# Patient Record
Sex: Male | Born: 1935
Health system: Southern US, Community
[De-identification: ages and names within clinical notes are randomized; demographics above are authoritative.]

## PROBLEM LIST (undated history)

## (undated) DIAGNOSIS — G4730 Sleep apnea, unspecified: Secondary | ICD-10-CM

## (undated) DIAGNOSIS — J9383 Other pneumothorax: Secondary | ICD-10-CM

## (undated) DIAGNOSIS — M545 Low back pain, unspecified: Secondary | ICD-10-CM

## (undated) DIAGNOSIS — K219 Gastro-esophageal reflux disease without esophagitis: Secondary | ICD-10-CM

## (undated) DIAGNOSIS — I714 Abdominal aortic aneurysm, without rupture, unspecified: Secondary | ICD-10-CM

## (undated) DIAGNOSIS — I779 Disorder of arteries and arterioles, unspecified: Secondary | ICD-10-CM

## (undated) DIAGNOSIS — N400 Enlarged prostate without lower urinary tract symptoms: Secondary | ICD-10-CM

## (undated) DIAGNOSIS — G8929 Other chronic pain: Secondary | ICD-10-CM

## (undated) DIAGNOSIS — J189 Pneumonia, unspecified organism: Secondary | ICD-10-CM

## (undated) DIAGNOSIS — I2510 Atherosclerotic heart disease of native coronary artery without angina pectoris: Secondary | ICD-10-CM

## (undated) DIAGNOSIS — E7800 Pure hypercholesterolemia, unspecified: Secondary | ICD-10-CM

## (undated) DIAGNOSIS — Z7901 Long term (current) use of anticoagulants: Secondary | ICD-10-CM

## (undated) DIAGNOSIS — I499 Cardiac arrhythmia, unspecified: Secondary | ICD-10-CM

## (undated) DIAGNOSIS — K2270 Barrett's esophagus without dysplasia: Secondary | ICD-10-CM

## (undated) DIAGNOSIS — M1990 Unspecified osteoarthritis, unspecified site: Secondary | ICD-10-CM

## (undated) DIAGNOSIS — I10 Essential (primary) hypertension: Secondary | ICD-10-CM

## (undated) DIAGNOSIS — E785 Hyperlipidemia, unspecified: Secondary | ICD-10-CM

## (undated) DIAGNOSIS — G4733 Obstructive sleep apnea (adult) (pediatric): Secondary | ICD-10-CM

## (undated) DIAGNOSIS — I2699 Other pulmonary embolism without acute cor pulmonale: Secondary | ICD-10-CM

## (undated) DIAGNOSIS — M7661 Achilles tendinitis, right leg: Secondary | ICD-10-CM

## (undated) DIAGNOSIS — Z95810 Presence of automatic (implantable) cardiac defibrillator: Secondary | ICD-10-CM

## (undated) DIAGNOSIS — G250 Essential tremor: Secondary | ICD-10-CM

## (undated) DIAGNOSIS — Z951 Presence of aortocoronary bypass graft: Secondary | ICD-10-CM

## (undated) DIAGNOSIS — I219 Acute myocardial infarction, unspecified: Secondary | ICD-10-CM

## (undated) DIAGNOSIS — I25810 Atherosclerosis of coronary artery bypass graft(s) without angina pectoris: Secondary | ICD-10-CM

## (undated) DIAGNOSIS — I509 Heart failure, unspecified: Secondary | ICD-10-CM

## (undated) DIAGNOSIS — L603 Nail dystrophy: Secondary | ICD-10-CM

## (undated) DIAGNOSIS — R0600 Dyspnea, unspecified: Secondary | ICD-10-CM

## (undated) DIAGNOSIS — E039 Hypothyroidism, unspecified: Secondary | ICD-10-CM

## (undated) DIAGNOSIS — I350 Nonrheumatic aortic (valve) stenosis: Secondary | ICD-10-CM

## (undated) DIAGNOSIS — Z9989 Dependence on other enabling machines and devices: Secondary | ICD-10-CM

## (undated) DIAGNOSIS — I4891 Unspecified atrial fibrillation: Secondary | ICD-10-CM

## (undated) HISTORY — DX: Unspecified osteoarthritis, unspecified site: M19.90

## (undated) HISTORY — DX: Barrett's esophagus without dysplasia: K22.70

## (undated) HISTORY — DX: Long term (current) use of anticoagulants: Z79.01

## (undated) HISTORY — DX: Essential tremor: G25.0

## (undated) HISTORY — DX: Nail dystrophy: L60.3

## (undated) HISTORY — DX: Other pulmonary embolism without acute cor pulmonale: I26.99

## (undated) HISTORY — DX: Sleep apnea, unspecified: G47.30

## (undated) HISTORY — DX: Achilles tendinitis, right leg: M76.61

## (undated) HISTORY — PX: PENILE PROSTHESIS IMPLANT: SHX240

## (undated) HISTORY — DX: Atherosclerosis of coronary artery bypass graft(s) without angina pectoris: I25.810

## (undated) HISTORY — DX: Hyperlipidemia, unspecified: E78.5

## (undated) HISTORY — DX: Benign prostatic hyperplasia without lower urinary tract symptoms: N40.0

## (undated) HISTORY — PX: ACHILLES TENDON REPAIR: SUR1153

---

## 1978-09-22 HISTORY — PX: INGUINAL HERNIA REPAIR: SUR1180

## 2000-01-14 ENCOUNTER — Encounter (INDEPENDENT_AMBULATORY_CARE_PROVIDER_SITE_OTHER): Payer: Self-pay | Admitting: Gastroenterology

## 2000-01-14 ENCOUNTER — Encounter (INDEPENDENT_AMBULATORY_CARE_PROVIDER_SITE_OTHER): Payer: Self-pay

## 2000-01-14 ENCOUNTER — Other Ambulatory Visit: Admission: RE | Admit: 2000-01-14 | Discharge: 2000-01-14 | Payer: Self-pay | Admitting: Gastroenterology

## 2001-05-03 ENCOUNTER — Encounter: Payer: Self-pay | Admitting: Specialist

## 2001-05-03 ENCOUNTER — Ambulatory Visit (HOSPITAL_COMMUNITY): Admission: RE | Admit: 2001-05-03 | Discharge: 2001-05-03 | Payer: Self-pay | Admitting: Specialist

## 2001-06-07 ENCOUNTER — Encounter: Payer: Self-pay | Admitting: Emergency Medicine

## 2001-06-07 ENCOUNTER — Inpatient Hospital Stay (HOSPITAL_COMMUNITY): Admission: EM | Admit: 2001-06-07 | Discharge: 2001-06-08 | Payer: Self-pay | Admitting: Emergency Medicine

## 2001-06-08 ENCOUNTER — Encounter: Payer: Self-pay | Admitting: Cardiovascular Disease

## 2001-09-22 HISTORY — PX: SHOULDER OPEN ROTATOR CUFF REPAIR: SHX2407

## 2002-02-28 ENCOUNTER — Ambulatory Visit (HOSPITAL_COMMUNITY): Admission: RE | Admit: 2002-02-28 | Discharge: 2002-02-28 | Payer: Self-pay | Admitting: Specialist

## 2002-03-08 ENCOUNTER — Encounter: Payer: Self-pay | Admitting: Specialist

## 2002-03-08 ENCOUNTER — Ambulatory Visit (HOSPITAL_COMMUNITY): Admission: RE | Admit: 2002-03-08 | Discharge: 2002-03-08 | Payer: Self-pay | Admitting: Specialist

## 2002-04-05 ENCOUNTER — Inpatient Hospital Stay (HOSPITAL_COMMUNITY): Admission: RE | Admit: 2002-04-05 | Discharge: 2002-04-06 | Payer: Self-pay | Admitting: Specialist

## 2003-06-06 ENCOUNTER — Ambulatory Visit (HOSPITAL_COMMUNITY): Admission: RE | Admit: 2003-06-06 | Discharge: 2003-06-06 | Payer: Self-pay | Admitting: Gastroenterology

## 2003-06-06 ENCOUNTER — Encounter (INDEPENDENT_AMBULATORY_CARE_PROVIDER_SITE_OTHER): Payer: Self-pay | Admitting: Specialist

## 2004-09-22 HISTORY — PX: KNEE ARTHROSCOPY: SHX127

## 2004-10-07 ENCOUNTER — Encounter: Admission: RE | Admit: 2004-10-07 | Discharge: 2004-10-07 | Payer: Self-pay | Admitting: Orthopedic Surgery

## 2004-10-09 ENCOUNTER — Ambulatory Visit (HOSPITAL_BASED_OUTPATIENT_CLINIC_OR_DEPARTMENT_OTHER): Admission: RE | Admit: 2004-10-09 | Discharge: 2004-10-09 | Payer: Self-pay | Admitting: Orthopedic Surgery

## 2004-10-09 ENCOUNTER — Ambulatory Visit (HOSPITAL_COMMUNITY): Admission: RE | Admit: 2004-10-09 | Discharge: 2004-10-09 | Payer: Self-pay | Admitting: Orthopedic Surgery

## 2004-12-12 ENCOUNTER — Encounter: Admission: RE | Admit: 2004-12-12 | Discharge: 2004-12-12 | Payer: Self-pay | Admitting: Orthopedic Surgery

## 2005-01-09 ENCOUNTER — Encounter: Admission: RE | Admit: 2005-01-09 | Discharge: 2005-01-09 | Payer: Self-pay | Admitting: Orthopedic Surgery

## 2005-03-18 ENCOUNTER — Encounter: Admission: RE | Admit: 2005-03-18 | Discharge: 2005-04-02 | Payer: Self-pay | Admitting: Orthopedic Surgery

## 2007-01-14 ENCOUNTER — Ambulatory Visit: Payer: Self-pay | Admitting: Vascular Surgery

## 2007-12-27 ENCOUNTER — Ambulatory Visit: Payer: Self-pay

## 2008-07-31 ENCOUNTER — Ambulatory Visit: Payer: Self-pay | Admitting: Vascular Surgery

## 2008-08-18 DIAGNOSIS — K449 Diaphragmatic hernia without obstruction or gangrene: Secondary | ICD-10-CM | POA: Insufficient documentation

## 2008-08-18 DIAGNOSIS — K2270 Barrett's esophagus without dysplasia: Secondary | ICD-10-CM

## 2008-08-18 DIAGNOSIS — Z860100 Personal history of colon polyps, unspecified: Secondary | ICD-10-CM | POA: Insufficient documentation

## 2008-08-18 DIAGNOSIS — K5730 Diverticulosis of large intestine without perforation or abscess without bleeding: Secondary | ICD-10-CM | POA: Insufficient documentation

## 2008-08-22 ENCOUNTER — Ambulatory Visit: Payer: Self-pay | Admitting: Gastroenterology

## 2008-08-22 DIAGNOSIS — I10 Essential (primary) hypertension: Secondary | ICD-10-CM | POA: Insufficient documentation

## 2008-08-22 DIAGNOSIS — G4733 Obstructive sleep apnea (adult) (pediatric): Secondary | ICD-10-CM

## 2008-08-23 ENCOUNTER — Encounter: Payer: Self-pay | Admitting: Gastroenterology

## 2008-08-23 ENCOUNTER — Ambulatory Visit: Payer: Self-pay | Admitting: Gastroenterology

## 2008-08-25 ENCOUNTER — Encounter: Payer: Self-pay | Admitting: Gastroenterology

## 2009-11-29 ENCOUNTER — Inpatient Hospital Stay (HOSPITAL_COMMUNITY): Admission: EM | Admit: 2009-11-29 | Discharge: 2009-12-03 | Payer: Self-pay | Admitting: Emergency Medicine

## 2010-02-07 ENCOUNTER — Ambulatory Visit: Payer: Self-pay

## 2010-02-07 ENCOUNTER — Encounter (INDEPENDENT_AMBULATORY_CARE_PROVIDER_SITE_OTHER): Payer: Self-pay | Admitting: Internal Medicine

## 2010-02-07 ENCOUNTER — Ambulatory Visit (HOSPITAL_COMMUNITY): Admission: RE | Admit: 2010-02-07 | Discharge: 2010-02-07 | Payer: Self-pay | Admitting: Internal Medicine

## 2010-02-07 ENCOUNTER — Ambulatory Visit: Payer: Self-pay | Admitting: Internal Medicine

## 2010-02-08 ENCOUNTER — Ambulatory Visit (HOSPITAL_COMMUNITY): Admission: RE | Admit: 2010-02-08 | Discharge: 2010-02-08 | Payer: Self-pay | Admitting: Internal Medicine

## 2010-02-08 ENCOUNTER — Encounter: Payer: Self-pay | Admitting: Pulmonary Disease

## 2010-02-26 ENCOUNTER — Ambulatory Visit: Payer: Self-pay | Admitting: Pulmonary Disease

## 2010-02-26 DIAGNOSIS — R0602 Shortness of breath: Secondary | ICD-10-CM

## 2010-02-26 LAB — CONVERTED CEMR LAB
CO2: 30 meq/L (ref 19–32)
Chloride: 106 meq/L (ref 96–112)
Potassium: 4.6 meq/L (ref 3.5–5.1)

## 2010-02-27 ENCOUNTER — Ambulatory Visit: Payer: Self-pay | Admitting: Cardiology

## 2010-02-27 ENCOUNTER — Encounter: Payer: Self-pay | Admitting: Pulmonary Disease

## 2010-03-13 ENCOUNTER — Ambulatory Visit: Payer: Self-pay | Admitting: Pulmonary Disease

## 2010-04-10 ENCOUNTER — Ambulatory Visit: Payer: Self-pay | Admitting: Pulmonary Disease

## 2010-04-10 DIAGNOSIS — J4520 Mild intermittent asthma, uncomplicated: Secondary | ICD-10-CM

## 2010-05-06 ENCOUNTER — Ambulatory Visit: Payer: Self-pay

## 2010-05-06 ENCOUNTER — Encounter (INDEPENDENT_AMBULATORY_CARE_PROVIDER_SITE_OTHER): Payer: Self-pay | Admitting: Internal Medicine

## 2010-05-06 ENCOUNTER — Ambulatory Visit: Payer: Self-pay | Admitting: Cardiology

## 2010-05-06 ENCOUNTER — Ambulatory Visit (HOSPITAL_COMMUNITY): Admission: RE | Admit: 2010-05-06 | Discharge: 2010-05-06 | Payer: Self-pay | Admitting: Internal Medicine

## 2010-05-29 ENCOUNTER — Ambulatory Visit: Payer: Self-pay | Admitting: Pulmonary Disease

## 2010-05-29 DIAGNOSIS — R490 Dysphonia: Secondary | ICD-10-CM | POA: Insufficient documentation

## 2010-06-24 ENCOUNTER — Telehealth (INDEPENDENT_AMBULATORY_CARE_PROVIDER_SITE_OTHER): Payer: Self-pay | Admitting: *Deleted

## 2010-10-08 ENCOUNTER — Ambulatory Visit
Admission: RE | Admit: 2010-10-08 | Discharge: 2010-10-08 | Payer: Self-pay | Source: Home / Self Care | Attending: Pulmonary Disease | Admitting: Pulmonary Disease

## 2010-10-18 ENCOUNTER — Ambulatory Visit: Admit: 2010-10-18 | Payer: Self-pay | Admitting: Cardiology

## 2010-10-22 NOTE — Assessment & Plan Note (Signed)
Summary: rov 2 wks ///kp   Visit Type:  Follow-up Copy to:  Creola Corn Primary Provider/Referring Provider:  Creola Corn, MD  CC:  Dyspnea follow-up.  Discuss CT chest results.  The patient says he has noticed a slight improvement in his breathing on Advair.Eric Gordon  History of Present Illness: 77 yo male with dyspnea, recent pneumonia, asthma, diastolic dysfunction, deconditioning.  His breathing has improved some.  He still gets winded with activity.  He is not having much cough, wheeze, or chest congestion.  He has not had trouble with his sinuses or sore throat.  He denies gland swelling, leg swelling, chest pain, or palpitations.  He has not had fever.  He uses his combivent about once or twice per week, and this helps.  CT of Chest  Procedure date:  02/27/2010  Findings:      CT ANGIOGRAPHY CHEST WITH CONTRAST   Technique:  Multidetector CT imaging of the chest was performed using the standard protocol during bolus administration of intravenous contrast.  Multiplanar CT image reconstructions including MIPs were obtained to evaluate the vascular anatomy.   Contrast:  72 ml Omnipaque 350 IV.   Comparison:  CT 12/01/2009.   Findings:  There is good pulmonary artery enhancement.  Negative for pulmonary embolism.   There is atherosclerotic disease in the aortic arch without aneurysm or dissection.  There is moderate coronary artery atherosclerosis.  The heart size is normal and there is no pericardial effusion.   There is mild bibasilar atelectasis which has nearly completely resolved compared the prior study.  Negative for acute infiltrate or effusion.  There is no mass or adenopathy. No significant emphysema or fibrosis in the lungs.   Review of the MIP images confirms the above findings.   IMPRESSION: Negative for pulmonary embolism.   Resolving bibasilar atelectasis.   Current Medications (verified): 1)  Advair Diskus 250-50 Mcg/dose Aepb (Fluticasone-Salmeterol) ....  One Puff Two Times A Day 2)  Combivent 18-103 Mcg/act Aero (Ipratropium-Albuterol) .... Use As Needed 3)  Fish Oil   Oil (Fish Oil) .Eric Gordon.. 1 Tablet By Mouth Once Daily 4)  Celebrex 200 Mg Caps (Celecoxib) .Eric Gordon.. 1 Tablet By Mouth Once Daily 5)  Cozaar 100 Mg Tabs (Losartan Potassium) .Eric Gordon.. 1 Tablet By Mouth Once Daily 6)  Adult Aspirin Ec Low Strength 81 Mg Tbec (Aspirin) .Eric Gordon.. 1 Tablet By Mouth Once Daily 7)  Simvastatin 40 Mg Tabs (Simvastatin) .Eric Gordon.. 1 Tablet By Mouth Once Daily 8)  Flomax 0.4 Mg Xr24h-Cap (Tamsulosin Hcl) .Eric Gordon.. 1 Tablet By Mouth Once Daily 9)  Omeprazole 20 Mg Cpdr (Omeprazole) .Eric Gordon.. 1 Tablet By Mouth Once Daily 10)  Vitamin C 500 Mg  Tabs (Ascorbic Acid) .Eric Gordon.. 1 Tablet By Mouth Once Daily 11)  Vitamin D 1000 Unit  Tabs (Cholecalciferol) .Eric Gordon.. 1 Tablet By Mouth Once Daily  Allergies (verified): No Known Drug Allergies   Past History:  Past Medical History: Reviewed history from 02/26/2010 and no changes required. Dyspnea      - PFT 02/08/10 FEV1 2.93(76%), FEV1% 73, TLC 6.51(78%), DLCO 67%, Positive BD response Pneumonia March 2011 Hypertension.  Grade 1 diastolic dysfunction      - Echo May 2011 Hyperlipidemia.  Hypertriglyceridemia.  GERD with Barrett esophagus.  Colon polyps.  Mild diverticulosis.  Degenerative low back osteoarthritis.  Severe OSA, on CPAP.  BPH with urinary obstruction symptoms.  Elevated PSA with negative biopsy.  ED with a penile pump.  Overactive bladder.  Nephrolithiasis.  Left carotid bruit, secondary ECA stenosis.  OA.  Atypical chest pain.  Plantar fasciitis.  Fungal dermatitis.  Bilateral ICA stenosis of 1-39% in 2005 and 2009 without any change.  Benign essential tremor.   Past Surgical History: Reviewed history from 02/26/2010 and no changes required.  1. Penile implant.   2. Right rotator cuff surgery.   3. Right Achilles heal operation in January 2006.   4. Ankle surgery.   5. Inguinal hernia in 1997.   6. history of knee  surgery.   Vital Signs:  Patient profile:   77 year old male Height:      75.5 inches (191.77 cm) Weight:      245 pounds (111.36 kg) BMI:     30.33 O2 Sat:      95 % on Room air Temp:     98.1 degrees F (36.72 degrees C) oral Pulse rate:   78 / minute BP sitting:   120 / 78  (right arm) Cuff size:   regular  Vitals Entered By: Michel Bickers CMA (March 13, 2010 12:01 PM)  O2 Sat at Rest %:  95 O2 Flow:  Room air CC: Dyspnea follow-up.  Discuss CT chest results.  The patient says he has noticed a slight improvement in his breathing on Advair. Comments Medications reviewed. Daytime phone verified. Michel Bickers [facility]  March 13, 2010 12:02 PM   Physical Exam  General:  normal appearance, healthy appearing, and obese.   Nose:  no deformity, discharge, inflammation, or lesions Mouth:  no deformity or lesions Neck:  no JVD.   Lungs:  diminished breath sounds, no wheezing or rales. Heart:  regular rhythm, normal rate, and no murmurs.   Extremities:  no clubbing, cyanosis, edema, or deformity noted Cervical Nodes:  no significant adenopathy   Impression & Recommendations:  Problem # 1:  DYSPNEA (ICD-786.05) This is likely on the basis of asthma after recent episode of pneumonia.  He has noticed some improvement with increased dose of advair.  Will give him a short course of prednisone.  Have advised him to gradually increase his exercise level as tolerated.  He has diastolic dysfunction, and this is being addressed by primary care.    If he does not notice much improvement from prednisone, then he may need to have cardiopulmonary stress testing (CPST).  Problem # 2:     Medications Added to Medication List This Visit: 1)  Prednisone 10 Mg Tabs (Prednisone) .... 3 pills for 2 days, 2 pills for 2 days, 1 pill for 2 days, 1/2 pill for 2 days  Complete Medication List: 1)  Advair Diskus 250-50 Mcg/dose Aepb (Fluticasone-salmeterol) .... One puff two times a day 2)  Combivent 18-103  Mcg/act Aero (Ipratropium-albuterol) .... Use as needed 3)  Fish Oil Oil (Fish oil) .Eric Gordon.. 1 tablet by mouth once daily 4)  Celebrex 200 Mg Caps (Celecoxib) .Eric Gordon.. 1 tablet by mouth once daily 5)  Cozaar 100 Mg Tabs (Losartan potassium) .Eric Gordon.. 1 tablet by mouth once daily 6)  Adult Aspirin Ec Low Strength 81 Mg Tbec (Aspirin) .Eric Gordon.. 1 tablet by mouth once daily 7)  Simvastatin 40 Mg Tabs (Simvastatin) .Eric Gordon.. 1 tablet by mouth once daily 8)  Flomax 0.4 Mg Xr24h-cap (Tamsulosin hcl) .Eric Gordon.. 1 tablet by mouth once daily 9)  Omeprazole 20 Mg Cpdr (Omeprazole) .Eric Gordon.. 1 tablet by mouth once daily 10)  Vitamin C 500 Mg Tabs (Ascorbic acid) .Eric Gordon.. 1 tablet by mouth once daily 11)  Vitamin D 1000 Unit Tabs (Cholecalciferol) .Eric Gordon.. 1 tablet by mouth once daily 12)  Prednisone 10 Mg Tabs (Prednisone) .... 3 pills for 2 days, 2 pills for 2 days, 1 pill for 2 days, 1/2 pill for 2 days  Other Orders: Est. Patient Level IV (16109) Prescription Created Electronically [phone])  Patient Instructions: 1)  Prednisone 10 mg pills: 3 pills for 2 days, 2 pills for 2 days, 1 pill for 2 days, 1/2 pill for 2 days 2)  Continue advair 250/50 one puff two times a day, and rinse mouth after using 3)  Continue combivent two puffs up to four times per day as needed  4)  Follow up in 3 to 4 weeks Prescriptions: PREDNISONE 10 MG TABS (PREDNISONE) 3 pills for 2 days, 2 pills for 2 days, 1 pill for 2 days, 1/2 pill for 2 days  #13 x 0   Entered and Authorized by:   Coralyn Helling MD   Signed by:   Coralyn Helling MD on 03/13/2010   Method used:   Electronically to        [facility]  [phone]* (retail)       [address]       [place], [place]  [address]       Ph: [phone] or [phone]       Fax: [phone]   RxID:   [id number]    Immunization History:  Influenza Immunization History:    Influenza:  historical (06/22/2009)  Pneumovax Immunization History:    Pneumovax:  historical  (09/23/2003)

## 2010-10-22 NOTE — Progress Notes (Signed)
Summary: medication questions  Phone Note Call from Patient Call back at Home Phone [phone]   Caller: Spouse//sylvia Call For: sood Summary of Call: Wants to know if it's ok for her husband to have a flu shot while he's taking symbicort and is it ok for him to have a routine checkup with his dentist while he on this medication also, pls advise. Initial call taken by: Darletta Moll,  June 24, 2010 9:56 AM  Follow-up for Phone Call        Spoke with pt's spouse and verified the msg.  I advised that it is fine for him to have flu shot while taking symbicort and also should be fine to go to dentist for routine visit, but if concrned about this could check with his dentist. Follow-up by: Vernie Murders,  June 24, 2010 10:18 AM

## 2010-10-22 NOTE — Miscellaneous (Signed)
Summary: Pulmonary function test   Pulmonary Function Test Date: 02/08/2010 Height (in.): 76 Gender: Male  Pre-Spirometry FVC    Value: 3.73 L/min   Pred: 5.23 L/min     % Pred: 71 % FEV1    Value: 2.61 L     Pred: 3.81 L     % Pred: 68 % FEV1/FVC  Value: 70 %     Pred: 72 %     % Pred: . % FEF 25-75  Value: 1.53 L/min   Pred: 2.74 L/min     % Pred: 55 %  Post-Spirometry FVC    Value: 3.99 L/min   Pred: 5.23 L/min     % Pred: 76 % FEV1    Value: 2.93 L     Pred: 3.81 L     % Pred: 76 % FEV1/FVC  Value: 73 %     Pred: 72 %     % Pred: . % FEF 25-75  Value: 2.74 L/min   Pred: 2.74 L/min     % Pred: 100 %  Lung Volumes TLC    Value: 6.51 L   % Pred: 78 % RV    Value: 2.35 L   % Pred: 80 % DLCO    Value: 27.16 %   % Pred: 67 % DLCO/VA  Value: 6.40 %   % Pred: 77 %  Comments: Mild obstruction.  Positive bronchodilator response.  Mild restriction.  Mild diffusion defect corrects for lung volumes. Clinical Lists Changes  Observations: Added new observation of PFT COMMENTS: Mild obstruction.  Positive bronchodilator response.  Mild restriction.  Mild diffusion defect corrects for lung volumes. (02/08/2010 13:23) Added new observation of DLCO/VA%EXP: 77 % (02/08/2010 13:23) Added new observation of DLCO/VA: 6.40 % (02/08/2010 13:23) Added new observation of DLCO % EXPEC: 67 % (02/08/2010 13:23) Added new observation of DLCO: 27.16 % (02/08/2010 13:23) Added new observation of RV % EXPECT: 80 % (02/08/2010 13:23) Added new observation of RV: 2.35 L (02/08/2010 13:23) Added new observation of TLC % EXPECT: 78 % (02/08/2010 13:23) Added new observation of TLC: 6.51 L (02/08/2010 13:23) Added new observation of FEF2575%EXPS: 100 % (02/08/2010 13:23) Added new observation of PSTFEF25/75P: 2.74  (02/08/2010 13:23) Added new observation of PSTFEF25/75%: 2.74 L/min (02/08/2010 13:23) Added new observation of PSTFEV1/FCV%: . % (02/08/2010 13:23) Added new observation of FEV1FVCPRDPS: 72 %  (02/08/2010 13:23) Added new observation of PSTFEV1/FVC: 73 % (02/08/2010 13:23) Added new observation of POSTFEV1%PRD: 76 % (02/08/2010 13:23) Added new observation of FEV1PRDPST: 3.81 L (02/08/2010 13:23) Added new observation of POST FEV1: 2.93 L/min (02/08/2010 13:23) Added new observation of POST FVC%EXP: 76 % (02/08/2010 13:23) Added new observation of FVCPRDPST: 5.23 L/min (02/08/2010 13:23) Added new observation of POST FVC: 3.99 L (02/08/2010 13:23) Added new observation of FEF % EXPEC: 55 % (02/08/2010 13:23) Added new observation of FEF25-75%PRE: 2.74 L/min (02/08/2010 13:23) Added new observation of FEF 25-75%: 1.53 L/min (02/08/2010 13:23) Added new observation of FEV1/FVC%EXP: . % (02/08/2010 13:23) Added new observation of FEV1/FVC PRE: 72 % (02/08/2010 13:23) Added new observation of FEV1/FVC: 70 % (02/08/2010 13:23) Added new observation of FEV1 % EXP: 68 % (02/08/2010 13:23) Added new observation of FEV1 PREDICT: 3.81 L (02/08/2010 13:23) Added new observation of FEV1: 2.61 L (02/08/2010 13:23) Added new observation of FVC % EXPECT: 71 % (02/08/2010 13:23) Added new observation of FVC PREDICT: 5.23 L (02/08/2010 13:23) Added new observation of FVC: 3.73 L (02/08/2010 13:23) Added new observation of PFT HEIGHT: 76  (  02/08/2010 13:23) Added new observation of PFT DATE: 02/08/2010  (02/08/2010 13:23)

## 2010-10-22 NOTE — Assessment & Plan Note (Signed)
Summary: dyspnea on excertion/apc   Copy to:  Creola Corn Primary Provider/Referring Provider:  Creola Corn, MD  CC:  Pulmonary Consult.  History of Present Illness: 77 yo male with dyspnea.  He was hospitalized in March for community acquired pneumonia.  He has been having trouble with his breathing since.  He did not have any problems prior to this.  When he first developed pneumonia he was initially treated as an outpatient with avelox, prednisone, and advair.  He continued to get worse, and then required admission to the hospital.  He was treated with rocephin, zithromax, and vancomycin.    He has been getting "breathing attacks" since he left the hospital.  This has happened three or four times.  He would get winded, sweaty, and dizzy.  He felt like he couldn't get air into his lungs.  He has noticed getting winded when he talks, and his voice gets week.  He can only walk about 100 feet before getting winded.  He is not having much cough or congestion now.  He gets occasional wheeze.  He has not had fever, hemoptysis, sweats, palpitations, or chest pain.  He has not had gland swelling, leg swelling, or skin rash.  His muscle strength has been okay.  There is no prior history of asthma, allergies, pneumonia, or TB.  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  He has not had any animal exposures.  There is no recent travel history.  He is from [place], and has lived here all his life.  He was in the [employer] from 1953 to 1957, and stationed at [employer].  He has been using combivent since her got out of the hospital, and this has helped.  He was started on advair about one week ago, and has no had a breathing attack since.  He had a chest xray with Dr. Timothy Lasso in May, and was told that it was okay.  Pulmonary function testing from Feb 08, 2010: Mild obstruction.  Positive bronchodilator response.  Mild restriction.  Mild diffusion defect corrects for lung  volumes.   CXR  Procedure date:  11/29/2009  Findings:       CHEST - 2 VIEW    Comparison: 10/07/2004    Findings: There is lingular / left lower lobe airspace disease   noted concerning for pneumonia.  Heart is normal size.  No focal   opacity on the right.  No effusions or acute bony abnormality.    IMPRESSION:   Left basilar opacity concerning for pneumonia.  CXR  Procedure date:  12/01/2009  Findings:       CHEST - 2 VIEW    Comparison: 11/29/2009    Findings: Heart size appears normal.    There is no pleural effusion or pulmonary edema.    No airspace consolidation is identified.    Scar versus atelectasis is noted in the left base.    No airspace consolidation identified.    IMPRESSION:    1.  Left base scar versus atelectasis.  CT of Chest  Procedure date:  12/01/2009  Findings:       CT ANGIOGRAPHY CHEST WITH CONTRAST    Technique:  Multidetector CT imaging of the chest was performed   using the standard protocol during bolus administration of   intravenous contrast.  Multiplanar CT image reconstructions   including MIPs were obtained to evaluate the vascular anatomy.    Contrast:  100 ml Omnipaque-300    Comparison:  Chest x-ray 12/01/2009  Findings:  Pulmonary arteries are well opacified.  There is no   evidence for acute pulmonary embolus.  Heart size is normal.  There   are patchy, linear densities involving the posterior aspects of the   lower lobes bilaterally, consistent atelectasis and/or infiltrates.   No pleural effusions are identified.  No pulmonary nodules.  No   mediastinal, hilar, or axillary adenopathy.  The thyroid gland has   a normal CT appearance. There is atherosclerotic calcification of   the thoracic aorta.    Images of the upper abdomen are unremarkable.  Degenerative changes   are seen in the thoracic spine.    Review of the MIP images confirms the above findings.    IMPRESSION:   Technically adequate exam  showing no evidence for acute pulmonary   embolus.  Bilateral lower lobe atelectasis and/or infiltrates.  Echocardiogram  Procedure date:  02/07/2010  Findings:       Study Conclusions            - Left ventricle: The cavity size was normal. There was mild focal       basal and mild concentric hypertrophy of the septum. Systolic       function was normal. The estimated ejection fraction was in the       range of 60% to 65%. Wall motion was normal; there were no       regional wall motion abnormalities. Doppler parameters are       consistent with abnormal left ventricular relaxation (grade 1       diastolic dysfunction). Doppler parameters are consistent with       high ventricular filling pressure.     - Mitral valve: Calcified annulus.     - Left atrium: The atrium was mildly to moderately dilated.     - Right atrium: The atrium was mildly to moderately dilated.   Current Medications (verified): 1)  Fish Oil   Oil (Fish Oil) .Marland Kitchen.. 1 Tablet By Mouth Once Daily 2)  Celebrex 200 Mg Caps (Celecoxib) .Marland Kitchen.. 1 Tablet By Mouth Once Daily 3)  Cozaar 100 Mg Tabs (Losartan Potassium) .Marland Kitchen.. 1 Tablet By Mouth Once Daily 4)  Adult Aspirin Ec Low Strength 81 Mg Tbec (Aspirin) .Marland Kitchen.. 1 Tablet By Mouth Once Daily 5)  Simvastatin 40 Mg Tabs (Simvastatin) .Marland Kitchen.. 1 Tablet By Mouth Once Daily 6)  Flomax 0.4 Mg Xr24h-Cap (Tamsulosin Hcl) .Marland Kitchen.. 1 Tablet By Mouth Once Daily 7)  Clonazepam 0.5 Mg Tabs (Clonazepam) .Marland Kitchen.. 1 Tablet By Mouth Once Daily As Needed 8)  Omeprazole 20 Mg Cpdr (Omeprazole) .Marland Kitchen.. 1 Tablet By Mouth Once Daily 9)  Vitamin C 500 Mg  Tabs (Ascorbic Acid) .Marland Kitchen.. 1 Tablet By Mouth Once Daily 10)  Vitamin D 1000 Unit  Tabs (Cholecalciferol) .Marland Kitchen.. 1 Tablet By Mouth Once Daily 11)  Combivent 18-103 Mcg/act Aero (Ipratropium-Albuterol) .... Use As Needed 12)  Advair Diskus 100-50 Mcg/dose Aepb (Fluticasone-Salmeterol) .... Inhale 1 Puff Two Times A Day  Allergies (verified): No Known Drug  Allergies  Past History:  Past Medical History: Dyspnea      - PFT 02/08/10 FEV1 2.93(76%), FEV1% 73, TLC 6.51(78%), DLCO 67%, Positive BD response Pneumonia March 2011 Hypertension.  Grade 1 diastolic dysfunction      - Echo May 2011 Hyperlipidemia.  Hypertriglyceridemia.  GERD with Barrett esophagus.  Colon polyps.  Mild diverticulosis.  Degenerative low back osteoarthritis.  Severe OSA, on CPAP.  BPH with urinary obstruction symptoms.  Elevated PSA with negative biopsy.  ED  with a penile pump.  Overactive bladder.  Nephrolithiasis.  Left carotid bruit, secondary ECA stenosis.  OA.  Atypical chest pain.  Plantar fasciitis.  Fungal dermatitis.  Bilateral ICA stenosis of 1-39% in 2005 and 2009 without any change.  Benign essential tremor.   Past Surgical History:  1. Penile implant.   2. Right rotator cuff surgery.   3. Right Achilles heal operation in January 2006.   4. Ankle surgery.   5. Inguinal hernia in 1997.   6. history of knee surgery.   Family History: Father died at the age of 64 of AAA.  Mother died at the age 43 with coronary disease and hypertension.  Siblings will include medical diagnosis of diabetes, coronary artery disease, CABG, peripheral vascular disease, and hypertension.   Social History: Married to [place], has two sons.  Works in Airline pilot.   Quit smoking in 1985.  Started age 38, and smoked 1.5 packs per day.  Review of Systems       The patient complains of shortness of breath with activity, shortness of breath at rest, acid heartburn, hand/feet swelling, and joint stiffness or pain.  The patient denies productive cough, non-productive cough, coughing up blood, chest pain, irregular heartbeats, indigestion, loss of appetite, weight change, abdominal pain, difficulty swallowing, sore throat, tooth/dental problems, headaches, nasal congestion/difficulty breathing through nose, sneezing, itching, ear ache, anxiety, depression, rash, change in color  of mucus, and fever.    Vital Signs:  Patient profile:   77 year old male Height:      75.5 inches Weight:      245 pounds BMI:     30.33 O2 Sat:      94 % on Room air Temp:     97.6 degrees F oral Pulse rate:   73 / minute BP sitting:   158 / 88  (left arm) Cuff size:   regular  Vitals Entered By: Arman Filter LPN (February 26, 1609 3:16 PM)  O2 Flow:  Room air CC: Pulmonary Consult Comments Medications reviewed with patient  Arman Filter LPN  February 27, 9603 3:18 PM    Physical Exam  General:  normal appearance, healthy appearing, and obese.   Eyes:  PERRLA and EOMI.   Ears:  TMs intact and clear with normal canals Nose:  no deformity, discharge, inflammation, or lesions Mouth:  no deformity or lesions Neck:  no JVD.   Chest Wall:  no deformities noted Lungs:  diminished breath sounds, no wheezing or rales. Heart:  regular rhythm, normal rate, and no murmurs.   Abdomen:  bowel sounds positive; abdomen soft and non-tender without masses, or organomegaly Msk:  no deformity or scoliosis noted with normal posture Pulses:  pulses normal Extremities:  no clubbing, cyanosis, edema, or deformity noted Neurologic:  CN II-XII grossly intact with normal reflexes, coordination, muscle strength and tone Skin:  intact without lesions or rashes Cervical Nodes:  no significant adenopathy Axillary Nodes:  no significant adenopathy Psych:  alert and cooperative; normal mood and affect; normal attention span and concentration   Impression & Recommendations:  Problem # 1:  DYSPNEA (ICD-786.05) He has onset of symptoms of dyspnea after being treated for pneumonia in March of this year.  He has since developed symptoms suggestive of asthma, and has partial improvement in his symptoms from inhaler therapy.  He likely has developed reactive airway disease after his pneumonia.  Will need to monitor whether his symptoms eventually resolve the further he gets from his pneumonia, or  if he will  need chronic asthma therapy.  He has an extensive prior history of smoking.  I will increase his dose of advair, and continue as needed combivent.  I don't think he needs additional prednisone or antibiotics at this time.  He had mild restrictive and diffusion defect on recent PFT.  My clinical suspicion for either interstitial lung disease or thrombo-embolic disease is low.  However, to further exclude these I will arrange for a repeat CT chest with IV contrast and PE protocol.  He has a history of hypertension, and recent echo showed grade 1 diastolic dysfunction.  This may be contributing to his dyspnea, but does not likely have a prominent role.  Explained the importance of adequate control of his blood pressure, and maintaining compliance with his therapy for sleep apnea. These are being managed by primary care.  He likely also has a component of deconditioning related to relative inactivity since his episode of pneumonia.  I have advised him to defer starting an exercise regimen until his pulmonary evaluation is completed.  However, there is nothing on his exam to suggest respiratory muscle weakness as a cause of his dyspnea.  Medications Added to Medication List This Visit: 1)  Advair Diskus 100-50 Mcg/dose Aepb (Fluticasone-salmeterol) .... Inhale 1 puff two times a day 2)  Advair Diskus 250-50 Mcg/dose Aepb (Fluticasone-salmeterol) .... One puff two times a day 3)  Combivent 18-103 Mcg/act Aero (Ipratropium-albuterol) .... Use as needed 4)  Clonazepam 0.5 Mg Tabs (Clonazepam) .Marland Kitchen.. 1 tablet by mouth once daily as needed  Complete Medication List: 1)  Advair Diskus 250-50 Mcg/dose Aepb (Fluticasone-salmeterol) .... One puff two times a day 2)  Combivent 18-103 Mcg/act Aero (Ipratropium-albuterol) .... Use as needed 3)  Fish Oil Oil (Fish oil) .Marland Kitchen.. 1 tablet by mouth once daily 4)  Celebrex 200 Mg Caps (Celecoxib) .Marland Kitchen.. 1 tablet by mouth once daily 5)  Cozaar 100 Mg Tabs (Losartan potassium)  .Marland Kitchen.. 1 tablet by mouth once daily 6)  Adult Aspirin Ec Low Strength 81 Mg Tbec (Aspirin) .Marland Kitchen.. 1 tablet by mouth once daily 7)  Simvastatin 40 Mg Tabs (Simvastatin) .Marland Kitchen.. 1 tablet by mouth once daily 8)  Flomax 0.4 Mg Xr24h-cap (Tamsulosin hcl) .Marland Kitchen.. 1 tablet by mouth once daily 9)  Clonazepam 0.5 Mg Tabs (Clonazepam) .Marland Kitchen.. 1 tablet by mouth once daily as needed 10)  Omeprazole 20 Mg Cpdr (Omeprazole) .Marland Kitchen.. 1 tablet by mouth once daily 11)  Vitamin C 500 Mg Tabs (Ascorbic acid) .Marland Kitchen.. 1 tablet by mouth once daily 12)  Vitamin D 1000 Unit Tabs (Cholecalciferol) .Marland Kitchen.. 1 tablet by mouth once daily  Other Orders: Consultation Level V (37106) Prescription Created Electronically [phone]) Radiology Referral (Radiology) TLB-BMP (Basic Metabolic Panel-BMET) (80048-METABOL)  Patient Instructions: 1)  Continue advair one puff two times a day 2)  Combivent two puffs up to four times per day as needed for cough, wheeze, chest congestion, or shortness of breath 3)  Will schedule CT chest 4)  Follow up in 2 to 3 weeks Prescriptions: ADVAIR DISKUS 250-50 MCG/DOSE AEPB (FLUTICASONE-SALMETEROL) one puff two times a day  #1 x 3   Entered and Authorized by:   Coralyn Helling MD   Signed by:   Coralyn Helling MD on 02/26/2010   Method used:   Electronically to        [facility]  [phone]* (retail)       [address] [place]  [place], [place]  [address]       Ph: [phone] or [phone]       Fax: [phone]   RxID:   [id number]

## 2010-10-22 NOTE — Assessment & Plan Note (Signed)
Summary: 2 months/apc   Visit Type:  Follow-up Copy to:  Eric Gordon Primary Provider/Referring Provider:  Creola Corn, MD  CC:  2 month dyspnea follow-up...patient c/o sob with exertion...still having difficulty swallowing...stopped singulair because swallowing became more difficult.  History of Present Illness: 77 yo male with dyspnea, recent pneumonia, asthma, diastolic dysfunction, deconditioning.  He still has some trouble with his breathing.  He has noticed some difficulty swallowing pills.  He denies pain with swallowing.  He has been using singulair, but is not sure if this has helped much.  He has been getting hoarse, and his voice feels weak.    He denies cough, wheeze, chest pain, or fever.  His sinuses have been okay.  He uses combivent occasionally, and this helps when he uses it.    Current Medications (verified): 1)  Advair Diskus 250-50 Mcg/dose Aepb (Fluticasone-Salmeterol) .... One Puff Two Times A Day 2)  Combivent 18-103 Mcg/act Aero (Ipratropium-Albuterol) .... Use As Needed 3)  Fish Oil   Oil (Fish Oil) .Marland Kitchen.. 1 Tablet By Mouth Once Daily 4)  Celebrex 200 Mg Caps (Celecoxib) .Marland Kitchen.. 1 Tablet By Mouth Once Daily 5)  Cozaar 100 Mg Tabs (Losartan Potassium) .Marland Kitchen.. 1 Tablet By Mouth Once Daily 6)  Adult Aspirin Ec Low Strength 81 Mg Tbec (Aspirin) .Marland Kitchen.. 1 Tablet By Mouth Once Daily 7)  Simvastatin 40 Mg Tabs (Simvastatin) .Marland Kitchen.. 1 Tablet By Mouth Once Daily 8)  Flomax 0.4 Mg Xr24h-Cap (Tamsulosin Hcl) .Marland Kitchen.. 1 Tablet By Mouth Once Daily 9)  Omeprazole 20 Mg Cpdr (Omeprazole) .Marland Kitchen.. 1 Tablet By Mouth Once Daily 10)  Vitamin C 500 Mg  Tabs (Ascorbic Acid) .Marland Kitchen.. 1 Tablet By Mouth Once Daily 11)  Vitamin D 1000 Unit  Tabs (Cholecalciferol) .Marland Kitchen.. 1 Tablet By Mouth Once Daily  Allergies (verified): No Known Drug Allergies  Past History:  Past Medical History: Reviewed history from 04/10/2010 and no changes required. Dyspnea 2nd to asthma and diastolic dysfunction      - PFT 02/08/10  FEV1 2.93(76%), FEV1% 73, TLC 6.51(78%), DLCO 67%, Positive BD response Pneumonia March 2011 Hypertension.  Grade 1 diastolic dysfunction      - Echo May 2011 Hyperlipidemia.  Hypertriglyceridemia.  GERD with Barrett esophagus.  Colon polyps.  Mild diverticulosis.  Degenerative low back osteoarthritis.  Severe OSA, on CPAP.  BPH with urinary obstruction symptoms.  Elevated PSA with negative biopsy.  ED with a penile pump.  Overactive bladder.  Nephrolithiasis.  Left carotid bruit, secondary ECA stenosis.  OA.  Atypical chest pain.  Plantar fasciitis.  Fungal dermatitis.  Bilateral ICA stenosis of 1-39% in 2005 and 2009 without any change.  Benign essential tremor.   Past Surgical History: Reviewed history from 02/26/2010 and no changes required.  1. Penile implant.   2. Right rotator cuff surgery.   3. Right Achilles heal operation in January 2006.   4. Ankle surgery.   5. Inguinal hernia in 1997.   6. history of knee surgery.   Vital Signs:  Patient profile:   77 year old male Height:      75.5 inches (191.77 cm) Weight:      248.38 pounds (112.90 kg) BMI:     30.75 O2 Sat:      98 % on Room air Temp:     97.8 degrees F (36.56 degrees C) oral Pulse rate:   84 / minute BP sitting:   126 / 80  (left arm) Cuff size:   regular  Vitals Entered By: Michel Bickers  CMA (May 29, 2010 1:27 PM)  O2 Sat at Rest %:  98 O2 Flow:  Room air CC: 2 month dyspnea follow-up...patient c/o sob with exertion...still having difficulty swallowing...stopped singulair because swallowing became more difficult   Exercise Stress Test  Procedure date:  05/06/2010  Findings:      Study Conclusions  - Stress ECG conclusions: The stress ECG was negative for ischemia. - Baseline: LV global systolic function was normal. The estimated LV   ejection fraction was 60%. Normal wall motion; no LV regional wall   motion abnormalities. - Peak stress: LV global systolic function was normal.  Hypokinesis   of the inferoseptal LV myocardium. Impressions:  - Walking on the treadmill there is no chest pain and no EKG change.   The echo images are difficult to assess. The stress image in the   parasternal long axis is does not allow assessment of all   segments. In the four chamber view, the inferior septum does not   thicken as well or move as well as the rest study. This suggests   ischemia in this area. Overall, the study is not normal. There may   be some ischemia. Bruce protocol. Stress echocardiography. Height: Height: 190.5cm. Height: 75in. Weight: Weight: 113.2kg. Weight: 249lb. Body mass index: BMI: 31.2kg/m^2. Body surface area: BSA: 2.78m^2. Blood pressure: 131/79. Patient status: Outpatient.   Physical Exam  General:  normal appearance, healthy appearing, and obese.   Ears:  TMs intact and clear with normal canals Nose:  no deformity, discharge, inflammation, or lesions Mouth:  no deformity or lesions Neck:  no JVD.   Lungs:  diminished breath sounds, no wheezing or rales. Heart:  regular rhythm, normal rate, and no murmurs.   Extremities:  no clubbing, cyanosis, edema, or deformity noted Neurologic:  normal CN II-XII and strength normal.   Cervical Nodes:  no significant adenopathy   Impression & Recommendations:  Problem # 1:  DYSPNEA (ICD-786.05) Likely related to asthma and diastolic dysfunction.  He had recent stress Echo which showed some possible areas of ischemia, and this could also be contributing to his dyspnea.  Problem # 2:  ASTHMA (ICD-493.90) His throat irritation could be related to advair.  Will change him to symbicort to see if this helps.  He is to continue using combivent.  Problem # 3:  HOARSENESS (ONG-295.28) He may have hoarseness related to inhaler therapy.  He also has a history of GERD and c/o some occasional difficulty with swallowing.  He is to continue on PPI therapy.  Will defer further assessment of this to primary  care.  Medications Added to Medication List This Visit: 1)  Symbicort 160-4.5 Mcg/act Aero (Budesonide-formoterol fumarate) .... Two puffs two times a day  Complete Medication List: 1)  Symbicort 160-4.5 Mcg/act Aero (Budesonide-formoterol fumarate) .... Two puffs two times a day 2)  Combivent 18-103 Mcg/act Aero (Ipratropium-albuterol) .... Use as needed 3)  Fish Oil Oil (Fish oil) .Marland Kitchen.. 1 tablet by mouth once daily 4)  Celebrex 200 Mg Caps (Celecoxib) .Marland Kitchen.. 1 tablet by mouth once daily 5)  Cozaar 100 Mg Tabs (Losartan potassium) .Marland Kitchen.. 1 tablet by mouth once daily 6)  Adult Aspirin Ec Low Strength 81 Mg Tbec (Aspirin) .Marland Kitchen.. 1 tablet by mouth once daily 7)  Simvastatin 40 Mg Tabs (Simvastatin) .Marland Kitchen.. 1 tablet by mouth once daily 8)  Flomax 0.4 Mg Xr24h-cap (Tamsulosin hcl) .Marland Kitchen.. 1 tablet by mouth once daily 9)  Omeprazole 20 Mg Cpdr (Omeprazole) .Marland Kitchen.. 1 tablet by  mouth once daily 10)  Vitamin C 500 Mg Tabs (Ascorbic acid) .Marland Kitchen.. 1 tablet by mouth once daily 11)  Vitamin D 1000 Unit Tabs (Cholecalciferol) .Marland Kitchen.. 1 tablet by mouth once daily  Other Orders: Est. Patient Level IV (57846)  Patient Instructions: 1)  Stop advair 2)  Symbicort two puffs two times a day, and rinse mouth after using 3)  Try using combivent before doing any prolonged exertion 4)  Follow up in 3 to 4 months Prescriptions: COMBIVENT 18-103 MCG/ACT AERO (IPRATROPIUM-ALBUTEROL) use as needed  #1 x 6   Entered and Authorized by:   Coralyn Helling MD   Signed by:   Coralyn Helling MD on 05/29/2010   Method used:   Electronically to        [facility]  [phone]* (retail)       [address]       [place], [place]  [address]       Ph: [phone] or [phone]       Fax: [phone]   RxID:   [id number] SYMBICORT 160-4.5 MCG/ACT AERO (BUDESONIDE-FORMOTEROL FUMARATE) two puffs two times a day  #1 x 6   Entered and Authorized by:   Coralyn Helling MD   Signed by:   Coralyn Helling MD on 05/29/2010    Method used:   Electronically to        [facility]  [phone]* (retail)       [address]       [place], [place]  [address]       Ph: [phone] or [phone]       Fax: [phone]   RxID:   [id number]

## 2010-10-22 NOTE — Assessment & Plan Note (Signed)
Summary: 2-3 week return/mhh   Copy to:    Primary Provider/Referring Provider:  Creola Corn, MD  CC:  follow up, pt states the breathing is better but still is troublesome, no cough, chest pain for a while and it comes and goes 3/10, and pt still uses inhalers daily and pt states it has helped some.  History of Present Illness: 77 yo male with dyspnea, recent pneumonia, asthma, diastolic dysfunction, deconditioning.  He feels his breathing has improved.  He felt much better with prednisone.  He does not have much cough or chest congestion.  He still gets some wheezing, but this improves with inhaler therapy.  He is not having sinus or throat problems.  He is not having reflux.  He denies chest pain or fever.  He has not needed to use his combivent much over the past few days.  He still gets winded at times with exertion.  Preventive Screening-Counseling & Management  Alcohol-Tobacco     Smoking Status: quit     Year Started: 1949     Year Quit: 1986     Pack years: 25     Passive Smoke Exposure: no  Caffeine-Diet-Exercise     Caffeine use/day: 3  Current Medications (verified): 1)  Advair Diskus 250-50 Mcg/dose Aepb (Fluticasone-Salmeterol) .... One Puff Two Times A Day 2)  Combivent 18-103 Mcg/act Aero (Ipratropium-Albuterol) .... Use As Needed 3)  Fish Oil   Oil (Fish Oil) .Marland Kitchen.. 1 Tablet By Mouth Once Daily 4)  Celebrex 200 Mg Caps (Celecoxib) .Marland Kitchen.. 1 Tablet By Mouth Once Daily 5)  Cozaar 100 Mg Tabs (Losartan Potassium) .Marland Kitchen.. 1 Tablet By Mouth Once Daily 6)  Adult Aspirin Ec Low Strength 81 Mg Tbec (Aspirin) .Marland Kitchen.. 1 Tablet By Mouth Once Daily 7)  Simvastatin 40 Mg Tabs (Simvastatin) .Marland Kitchen.. 1 Tablet By Mouth Once Daily 8)  Flomax 0.4 Mg Xr24h-Cap (Tamsulosin Hcl) .Marland Kitchen.. 1 Tablet By Mouth Once Daily 9)  Omeprazole 20 Mg Cpdr (Omeprazole) .Marland Kitchen.. 1 Tablet By Mouth Once Daily 10)  Vitamin C 500 Mg  Tabs (Ascorbic Acid) .Marland Kitchen.. 1 Tablet By Mouth Once Daily 11)  Vitamin D 1000 Unit  Tabs  (Cholecalciferol) .Marland Kitchen.. 1 Tablet By Mouth Once Daily  Allergies (verified): No Known Drug Allergies  Past History:  Past Medical History: Dyspnea 2nd to asthma and diastolic dysfunction      - PFT 02/08/10 FEV1 2.93(76%), FEV1% 73, TLC 6.51(78%), DLCO 67%, Positive BD response Pneumonia March 2011 Hypertension.  Grade 1 diastolic dysfunction      - Echo May 2011 Hyperlipidemia.  Hypertriglyceridemia.  GERD with Barrett esophagus.  Colon polyps.  Mild diverticulosis.  Degenerative low back osteoarthritis.  Severe OSA, on CPAP.  BPH with urinary obstruction symptoms.  Elevated PSA with negative biopsy.  ED with a penile pump.  Overactive bladder.  Nephrolithiasis.  Left carotid bruit, secondary ECA stenosis.  OA.  Atypical chest pain.  Plantar fasciitis.  Fungal dermatitis.  Bilateral ICA stenosis of 1-39% in 2005 and 2009 without any change.  Benign essential tremor.   Past Surgical History: Reviewed history from 02/26/2010 and no changes required.  1. Penile implant.   2. Right rotator cuff surgery.   3. Right Achilles heal operation in January 2006.   4. Ankle surgery.   5. Inguinal hernia in 1997.   6. history of knee surgery.   Vital Signs:  Patient profile:   77 year old male Height:      75.5 inches Weight:  246.8 pounds BMI:     30.55 O2 Sat:      93 % on Room air Temp:     97.0 degrees F oral Pulse rate:   74 / minute BP sitting:   144 / 90  (right arm) Cuff size:   regular  Vitals Entered By: Carver Fila (April 10, 2010 9:27 AM)  O2 Flow:  Room air CC: follow up, pt states the breathing is better but still is troublesome, no cough, chest pain for a while and it comes and goes 3/10, pt still uses inhalers daily and pt states it has helped some Is Patient Diabetic? No Comments meds and allergies updated Phone number updated Carver Fila  April 10, 2010 9:27 AM    Physical Exam  General:  normal appearance, healthy appearing, and obese.   Nose:   no deformity, discharge, inflammation, or lesions Mouth:  no deformity or lesions Neck:  no JVD.   Lungs:  diminished breath sounds, no wheezing or rales. Heart:  regular rhythm, normal rate, and no murmurs.   Extremities:  no clubbing, cyanosis, edema, or deformity noted Cervical Nodes:  no significant adenopathy   Impression & Recommendations:  Problem # 1:  DYSPNEA (ICD-786.05)  Likely related to asthma and diastolic dysfunction.  He has improvement with augmented asthma therapy.  Problem # 2:  ASTHMA (ICD-493.90) He has improved, but still has some symptoms.  Will continue advair at 250, and add singulair.  He is to continue as needed combivent  Medications Added to Medication List This Visit: 1)  Singulair 10 Mg Tabs (Montelukast sodium) .... One by mouth at bedtime  Complete Medication List: 1)  Advair Diskus 250-50 Mcg/dose Aepb (Fluticasone-salmeterol) .... One puff two times a day 2)  Combivent 18-103 Mcg/act Aero (Ipratropium-albuterol) .... Use as needed 3)  Fish Oil Oil (Fish oil) .Marland Kitchen.. 1 tablet by mouth once daily 4)  Celebrex 200 Mg Caps (Celecoxib) .Marland Kitchen.. 1 tablet by mouth once daily 5)  Cozaar 100 Mg Tabs (Losartan potassium) .Marland Kitchen.. 1 tablet by mouth once daily 6)  Adult Aspirin Ec Low Strength 81 Mg Tbec (Aspirin) .Marland Kitchen.. 1 tablet by mouth once daily 7)  Simvastatin 40 Mg Tabs (Simvastatin) .Marland Kitchen.. 1 tablet by mouth once daily 8)  Flomax 0.4 Mg Xr24h-cap (Tamsulosin hcl) .Marland Kitchen.. 1 tablet by mouth once daily 9)  Omeprazole 20 Mg Cpdr (Omeprazole) .Marland Kitchen.. 1 tablet by mouth once daily 10)  Vitamin C 500 Mg Tabs (Ascorbic acid) .Marland Kitchen.. 1 tablet by mouth once daily 11)  Vitamin D 1000 Unit Tabs (Cholecalciferol) .Marland Kitchen.. 1 tablet by mouth once daily 12)  Singulair 10 Mg Tabs (Montelukast sodium) .... One by mouth at bedtime  Other Orders: Est. Patient Level III (34742)  Patient Instructions: 1)  Singulair 10 mg at bedtime 2)  Advair one puff two times a day 3)  Combivent two puffs as  needed  4)  Follow up in 2 months Prescriptions: SINGULAIR 10 MG TABS (MONTELUKAST SODIUM) one by mouth at bedtime  #30 x 3   Entered and Authorized by:   Coralyn Helling MD   Signed by:   Coralyn Helling MD on 04/10/2010   Method used:   Electronically to        [facility]  [phone]* (retail)       [address]       [place], [place]  [address]       Ph: [phone] or [phone]  Fax: [phone]   RxID:   [id number]

## 2010-10-24 NOTE — Assessment & Plan Note (Signed)
Summary: rov ///kp   Copy to:  Creola Corn Primary Provider/Referring Provider:  Creola Corn, MD  CC:  follow up. pt states breathing is some better. Pt c/o little wheezing and chest pains and it comes and goes with the SOB. pt denies any cough.  History of Present Illness: 77 yo male with dyspnea, recent pneumonia, asthma, diastolic dysfunction, deconditioning.  His hoarseness is better.  He is using combivent once daily.  He continues to get winded with activity.  He is not having much wheeze or cough.  He gets chest discomfort with exertion.    Current Medications (verified): 1)  Symbicort 160-4.5 Mcg/act Aero (Budesonide-Formoterol Fumarate) .... Two Puffs Two Times A Day 2)  Combivent 18-103 Mcg/act Aero (Ipratropium-Albuterol) .... Use As Needed 3)  Fish Oil   Oil (Fish Oil) .Marland Kitchen.. 1 Tablet By Mouth Once Daily 4)  Celebrex 200 Mg Caps (Celecoxib) .Marland Kitchen.. 1 Tablet By Mouth Once Daily 5)  Cozaar 100 Mg Tabs (Losartan Potassium) .Marland Kitchen.. 1 Tablet By Mouth Once Daily 6)  Adult Aspirin Ec Low Strength 81 Mg Tbec (Aspirin) .Marland Kitchen.. 1 Tablet By Mouth Once Daily 7)  Simvastatin 40 Mg Tabs (Simvastatin) .Marland Kitchen.. 1 Tablet By Mouth Once Daily 8)  Flomax 0.4 Mg Xr24h-Cap (Tamsulosin Hcl) .Marland Kitchen.. 1 Tablet By Mouth Once Daily 9)  Omeprazole 20 Mg Cpdr (Omeprazole) .Marland Kitchen.. 1 Tablet By Mouth Once Daily 10)  Vitamin C 500 Mg  Tabs (Ascorbic Acid) .Marland Kitchen.. 1 Tablet By Mouth Once Daily 11)  Vitamin D 1000 Unit  Tabs (Cholecalciferol) .Marland Kitchen.. 1 Tablet By Mouth Once Daily  Allergies (verified): No Known Drug Allergies  Past History:  Past Medical History: Reviewed history from 04/10/2010 and no changes required. Dyspnea 2nd to asthma and diastolic dysfunction      - PFT 02/08/10 FEV1 2.93(76%), FEV1% 73, TLC 6.51(78%), DLCO 67%, Positive BD response Pneumonia March 2011 Hypertension.  Grade 1 diastolic dysfunction      - Echo May 2011 Hyperlipidemia.  Hypertriglyceridemia.  GERD with Barrett esophagus.  Colon polyps.    Mild diverticulosis.  Degenerative low back osteoarthritis.  Severe OSA, on CPAP.  BPH with urinary obstruction symptoms.  Elevated PSA with negative biopsy.  ED with a penile pump.  Overactive bladder.  Nephrolithiasis.  Left carotid bruit, secondary ECA stenosis.  OA.  Atypical chest pain.  Plantar fasciitis.  Fungal dermatitis.  Bilateral ICA stenosis of 1-39% in 2005 and 2009 without any change.  Benign essential tremor.   Past Surgical History: Reviewed history from 02/26/2010 and no changes required.  1. Penile implant.   2. Right rotator cuff surgery.   3. Right Achilles heal operation in January 2006.   4. Ankle surgery.   5. Inguinal hernia in 1997.   6. history of knee surgery.   Family History: Reviewed history from 02/26/2010 and no changes required. Father died at the age of 72 of AAA.  Mother died at the age 74 with coronary disease and hypertension.  Siblings will include medical diagnosis of diabetes, coronary artery disease, CABG, peripheral vascular disease, and hypertension.   Social History: Reviewed history from 02/26/2010 and no changes required. Married to [place], has two sons.  Works in [employer].   Quit smoking in 1985.  Started age 38, and smoked 1.5 packs per day.  Vital Signs:  Patient profile:   77 year old male Height:      75.5 inches Weight:      246.13 pounds BMI:     30.47 O2 Sat:  95 % on Room air Temp:     97.4 degrees F oral Pulse rate:   85 / minute BP sitting:   118 / 62  (left arm) Cuff size:   regular  Vitals Entered By: Carver Fila (October 08, 2010 1:30 PM)  O2 Flow:  Room air CC: follow up. pt states breathing is some better. Pt c/o little wheezing, chest pains and it comes and goes with the SOB. pt denies any cough Comments meds and allergies updated Phone number updated Carver Fila  October 08, 2010 1:30 PM    Physical Exam  General:  normal appearance, healthy appearing, and obese.   Nose:  no deformity,  discharge, inflammation, or lesions Mouth:  no deformity or lesions Neck:  no JVD.   Lungs:  diminished breath sounds, no wheezing or rales. Heart:  regular rhythm, normal rate, and no murmurs.   Extremities:  no clubbing, cyanosis, edema, or deformity noted Neurologic:  normal CN II-XII and strength normal.   Cervical Nodes:  no significant adenopathy Psych:  alert and cooperative; normal mood and affect; normal attention span and concentration   Impression & Recommendations:  Problem # 1:  DYSPNEA (ICD-786.05) He has persistent symptoms.  His dyspnea is out of proportion to his pulmonary testing.  He has multiple risk factors for CAD.  The patient has requested to have further cardiac evaluation.  Will arrange for cardiology evaluation to further assess.  Problem # 2:  ASTHMA (ICD-493.90) He is to continue on his inhaler regimen.  Problem # 3:  HOARSENESS (ICD-784.42)  Improved with change from advair to symbicort.  Orders: Est. Patient Level IV (04540) Cardiology Referral (Cardiology)  Complete Medication List: 1)  Symbicort 160-4.5 Mcg/act Aero (Budesonide-formoterol fumarate) .... Two puffs two times a day 2)  Combivent 18-103 Mcg/act Aero (Ipratropium-albuterol) .... Use as needed 3)  Fish Oil Oil (Fish oil) .Marland Kitchen.. 1 tablet by mouth once daily 4)  Celebrex 200 Mg Caps (Celecoxib) .Marland Kitchen.. 1 tablet by mouth once daily 5)  Cozaar 100 Mg Tabs (Losartan potassium) .Marland Kitchen.. 1 tablet by mouth once daily 6)  Adult Aspirin Ec Low Strength 81 Mg Tbec (Aspirin) .Marland Kitchen.. 1 tablet by mouth once daily 7)  Simvastatin 40 Mg Tabs (Simvastatin) .Marland Kitchen.. 1 tablet by mouth once daily 8)  Flomax 0.4 Mg Xr24h-cap (Tamsulosin hcl) .Marland Kitchen.. 1 tablet by mouth once daily 9)  Omeprazole 20 Mg Cpdr (Omeprazole) .Marland Kitchen.. 1 tablet by mouth once daily 10)  Vitamin C 500 Mg Tabs (Ascorbic acid) .Marland Kitchen.. 1 tablet by mouth once daily 11)  Vitamin D 1000 Unit Tabs (Cholecalciferol) .Marland Kitchen.. 1 tablet by mouth once daily  Patient  Instructions: 1)  Will arrange for cardiology evaluation 2)  Follow up in 4 months Prescriptions: SYMBICORT 160-4.5 MCG/ACT AERO (BUDESONIDE-FORMOTEROL FUMARATE) two puffs two times a day  #3 x 3   Entered and Authorized by:   Coralyn Helling MD   Signed by:   Coralyn Helling MD on 10/08/2010   Method used:   Faxed to ...       [facility] (mail-order)             , [place]         Ph: [phone]       Fax: [phone]   RxID:   [id number]    Immunization History:  Influenza Immunization History:    Influenza:  historical (06/22/2010)

## 2010-10-29 ENCOUNTER — Encounter: Payer: Self-pay | Admitting: Cardiology

## 2010-11-01 ENCOUNTER — Other Ambulatory Visit: Payer: Self-pay | Admitting: Cardiology

## 2010-11-01 ENCOUNTER — Ambulatory Visit (INDEPENDENT_AMBULATORY_CARE_PROVIDER_SITE_OTHER): Payer: Medicare Other | Admitting: Cardiology

## 2010-11-01 ENCOUNTER — Encounter: Payer: Self-pay | Admitting: Cardiology

## 2010-11-01 DIAGNOSIS — R0789 Other chest pain: Secondary | ICD-10-CM | POA: Insufficient documentation

## 2010-11-01 DIAGNOSIS — I6529 Occlusion and stenosis of unspecified carotid artery: Secondary | ICD-10-CM

## 2010-11-01 DIAGNOSIS — R0609 Other forms of dyspnea: Secondary | ICD-10-CM

## 2010-11-01 DIAGNOSIS — R0989 Other specified symptoms and signs involving the circulatory and respiratory systems: Secondary | ICD-10-CM

## 2010-11-01 LAB — CBC WITH DIFFERENTIAL/PLATELET
Basophils Absolute: 0 10*3/uL (ref 0.0–0.1)
Eosinophils Absolute: 0.2 10*3/uL (ref 0.0–0.7)
HCT: 46 % (ref 39.0–52.0)
Hemoglobin: 15.5 g/dL (ref 13.0–17.0)
Lymphs Abs: 1.6 10*3/uL (ref 0.7–4.0)
MCHC: 33.8 g/dL (ref 30.0–36.0)
MCV: 96 fl (ref 78.0–100.0)
Monocytes Absolute: 0.7 10*3/uL (ref 0.1–1.0)
Neutro Abs: 5.5 10*3/uL (ref 1.4–7.7)
RDW: 14.1 % (ref 11.5–14.6)

## 2010-11-01 LAB — BASIC METABOLIC PANEL
CO2: 31 mEq/L (ref 19–32)
Glucose, Bld: 82 mg/dL (ref 70–99)
Potassium: 4.5 mEq/L (ref 3.5–5.1)
Sodium: 143 mEq/L (ref 135–145)

## 2010-11-04 ENCOUNTER — Inpatient Hospital Stay (HOSPITAL_BASED_OUTPATIENT_CLINIC_OR_DEPARTMENT_OTHER)
Admission: RE | Admit: 2010-11-04 | Discharge: 2010-11-04 | Disposition: A | Discharge status: Home / Self Care | Payer: Medicare Other | Source: Ambulatory Visit | Attending: Cardiovascular Disease | Admitting: Cardiovascular Disease

## 2010-11-04 DIAGNOSIS — R0602 Shortness of breath: Secondary | ICD-10-CM | POA: Insufficient documentation

## 2010-11-04 DIAGNOSIS — R609 Edema, unspecified: Secondary | ICD-10-CM | POA: Insufficient documentation

## 2010-11-04 DIAGNOSIS — I714 Abdominal aortic aneurysm, without rupture, unspecified: Secondary | ICD-10-CM | POA: Insufficient documentation

## 2010-11-04 DIAGNOSIS — I2510 Atherosclerotic heart disease of native coronary artery without angina pectoris: Secondary | ICD-10-CM

## 2010-11-04 DIAGNOSIS — R0989 Other specified symptoms and signs involving the circulatory and respiratory systems: Secondary | ICD-10-CM | POA: Insufficient documentation

## 2010-11-04 DIAGNOSIS — R0609 Other forms of dyspnea: Secondary | ICD-10-CM | POA: Insufficient documentation

## 2010-11-04 DIAGNOSIS — R079 Chest pain, unspecified: Secondary | ICD-10-CM | POA: Insufficient documentation

## 2010-11-04 LAB — POCT I-STAT 3, VENOUS BLOOD GAS (G3P V)
Acid-base deficit: 1 mmol/L (ref 0.0–2.0)
Bicarbonate: 25.3 mEq/L — ABNORMAL HIGH (ref 20.0–24.0)

## 2010-11-04 LAB — POCT I-STAT 3, ART BLOOD GAS (G3+)
Acid-Base Excess: 2 mmol/L (ref 0.0–2.0)
Bicarbonate: 27.1 mEq/L — ABNORMAL HIGH (ref 20.0–24.0)
O2 Saturation: 93 %
TCO2: 28 mmol/L (ref 0–100)
pH, Arterial: 7.384 (ref 7.350–7.450)

## 2010-11-06 ENCOUNTER — Encounter: Payer: Self-pay | Admitting: Cardiology

## 2010-11-06 DIAGNOSIS — I714 Abdominal aortic aneurysm, without rupture: Secondary | ICD-10-CM | POA: Insufficient documentation

## 2010-11-07 NOTE — Letter (Addendum)
Summary: [facility]   Imported By: Marylou Mccoy 10/30/2010 14:29:00  _____________________________________________________________________  External Attachment:    Type:   Image     Comment:   External Document

## 2010-11-07 NOTE — Letter (Signed)
Summary: Cardiac Catheterization Instructions- [facility]  [facility], Main Office  [address]   [place], [place] [address]   Phone: [phone]  Fax: [phone]     11/01/2010 MRN: [id number]  KIMM UNGARO [address] [place], [place]  [address]  [place]  Dear Mr. UPCHURCH,   You are scheduled for a Cardiac Catheterization on Monday Feb. 13,2012 with Dr.Nishan  Please arrive to the 1st floor of the [facility] at [facility] at 8:30 am on the day of your procedure. Please do not arrive before 6:30 a.m. Call the Heart and Vascular Center at [phone] if you are unable to make your appointmnet. The Code to get into the parking garage under the building is 0300. Take the elevators to the 1st floor. You must have someone to drive you home. Someone must be with you for the first 24 hours after you arrive home. Please wear clothes that are easy to get on and off and wear slip-on shoes. Do not eat or drink after midnight except water with your medications that morning. Bring all your medications and current insurance cards with you.  __x_ Make sure you take your aspirin.  __x_ You may take ALL of your medications with water that morning.   The usual length of stay after your procedure is 2 to 3 hours. This can vary.  If you have any questions, please call the office at the number listed above.   Lisabeth Devoid RN

## 2010-11-07 NOTE — Assessment & Plan Note (Signed)
Summary: np6. unexplained sob- medicare, bcbs- per dr. Timothy Lasso office 62...   Visit Type:  Initial Consult Referring Provider:  Creola Corn Primary Provider:  Creola Corn, MD  CC:  sob and slight pain in chest area. edema in ankles at times..  History of Present Illness: Mr Eric Gordon comes in today for DOE and chest pain. He was diagnosed with pneumonia in March 2011, but since then has continued to have DOE. His chest pain is substernal and occurs with exertion. Described as sharp and at times pressure.  He had a Stress Echo 05/06/10 that showed inferoseptal HK. His wife said he was exhausted after the test He has multiple CRF's as outlined.  EKG shows NSR with LAFB.  Current Medications (verified): 1)  Symbicort 160-4.5 Mcg/act Aero (Budesonide-Formoterol Fumarate) .... Two Puffs Two Times A Day 2)  Combivent 18-103 Mcg/act Aero (Ipratropium-Albuterol) .... Use As Needed 3)  Fish Oil   Oil (Fish Oil) .Marland Kitchen.. 1 Tablet By Mouth Once Daily 4)  Celebrex 200 Mg Caps (Celecoxib) .Marland Kitchen.. 1 Tablet By Mouth Once Daily 5)  Cozaar 100 Mg Tabs (Losartan Potassium) .Marland Kitchen.. 1 Tablet By Mouth Once Daily 6)  Adult Aspirin Ec Low Strength 81 Mg Tbec (Aspirin) .Marland Kitchen.. 1 Tablet By Mouth Once Daily 7)  Simvastatin 40 Mg Tabs (Simvastatin) .Marland Kitchen.. 1 Tablet By Mouth Once Daily 8)  Flomax 0.4 Mg Xr24h-Cap (Tamsulosin Hcl) .Marland Kitchen.. 1 Tablet By Mouth Once Daily 9)  Omeprazole 20 Mg Cpdr (Omeprazole) .Marland Kitchen.. 1 Tablet By Mouth Once Daily 10)  Vitamin C 500 Mg  Tabs (Ascorbic Acid) .Marland Kitchen.. 1 Tablet By Mouth Once Daily 11)  Vitamin D 1000 Unit  Tabs (Cholecalciferol) .Marland Kitchen.. 1 Tablet By Mouth Once Daily 12)  Hydrochlorothiazide 25 Mg Tabs (Hydrochlorothiazide) .... Take 1 Tablet By Mouth Once A Day  Allergies (verified): No Known Drug Allergies  Past History:  Past Medical History: Last updated: 04/10/2010 Dyspnea 2nd to asthma and diastolic dysfunction      - PFT 02/08/10 FEV1 2.93(76%), FEV1% 73, TLC 6.51(78%), DLCO 67%, Positive BD  response Pneumonia March 2011 Hypertension.  Grade 1 diastolic dysfunction      - Echo May 2011 Hyperlipidemia.  Hypertriglyceridemia.  GERD with Barrett esophagus.  Colon polyps.  Mild diverticulosis.  Degenerative low back osteoarthritis.  Severe OSA, on CPAP.  BPH with urinary obstruction symptoms.  Elevated PSA with negative biopsy.  ED with a penile pump.  Overactive bladder.  Nephrolithiasis.  Left carotid bruit, secondary ECA stenosis.  OA.  Atypical chest pain.  Plantar fasciitis.  Fungal dermatitis.  Bilateral ICA stenosis of 1-39% in 2005 and 2009 without any change.  Benign essential tremor.   Past Surgical History: Last updated: 2010/03/03  1. Penile implant.   2. Right rotator cuff surgery.   3. Right Achilles heal operation in January 2006.   4. Ankle surgery.   5. Inguinal hernia in 1997.   6. history of knee surgery.   Family History: Last updated: 03-03-10 Father died at the age of 32 of AAA.  Mother died at the age 59 with coronary disease and hypertension.  Siblings will include medical diagnosis of diabetes, coronary artery disease, CABG, peripheral vascular disease, and hypertension.   Social History: Last updated: 03-Mar-2010 Married to [place], has two sons.  Works in Airline pilot.   Quit smoking in 1985.  Started age 51, and smoked 1.5 packs per day.  Risk Factors: Caffeine Use: 3 (04/10/2010)  Risk Factors: Smoking Status: quit (04/10/2010) Passive Smoke Exposure: no (04/10/2010)  Review  of Systems       negative other than HPI  Vital Signs:  Patient profile:   77 year old male Height:      75.5 inches Weight:      248.50 pounds BMI:     30.76 Pulse rate:   82 / minute Resp:     18 per minute BP sitting:   134 / 82  (left arm)  Vitals Entered By: Celestia Khat, CMA (November 01, 2010 9:03 AM)  Physical Exam  General:  obese.   Head:  normocephalic and atraumatic Eyes:  PERRLA/EOM intact; conjunctiva and lids normal. Neck:  Neck  supple, no JVD. No masses, thyromegaly or abnormal cervical nodes. Chest Greely Atiyeh:  no deformities or breast masses noted Lungs:  Clear bilaterally to auscultation and percussion. Heart:  RRR, Nl S1 and S2, Left carotid bruit Abdomen:  ND,NT no bruit Msk:  Back normal, normal gait. Muscle strength and tone normal. Pulses:  pulses normal in all 4 extremities Extremities:  No clubbing or cyanosis. Neurologic:  Alert and oriented x 3. Skin:  Intact without lesions or rashes. Psych:  Normal affect.   Problems:  Medical Problems Added: 1)  Dx of Chest Tightness-pressure-other  (ZOX-096.04)  Impression & Recommendations:  Problem # 1:  DYSPNEA (ICD-786.05) Assessment Deteriorated  Concerned about obstructive disease. Recommended cardiac cat, risks, benefit discussed. Pt agrees to proceed. His updated medication list for this problem includes:    Cozaar 100 Mg Tabs (Losartan potassium) .Marland Kitchen... 1 tablet by mouth once daily    Adult Aspirin Ec Low Strength 81 Mg Tbec (Aspirin) .Marland Kitchen... 1 tablet by mouth once daily    Hydrochlorothiazide 25 Mg Tabs (Hydrochlorothiazide) .Marland Kitchen... Take 1 tablet by mouth once a day  Orders: EKG w/ Interpretation (93000) Cardiac Catheterization (Cardiac Cath)  Problem # 2:  CHEST TIGHTNESS-PRESSURE-OTHER (VWU-981.19) Assessment: Deteriorated  His updated medication list for this problem includes:    Adult Aspirin Ec Low Strength 81 Mg Tbec (Aspirin) .Marland Kitchen... 1 tablet by mouth once daily  Orders: EKG w/ Interpretation (93000) TLB-BMP (Basic Metabolic Panel-BMET) (80048-METABOL) TLB-CBC Platelet - w/Differential (85025-CBCD) TLB-PT (Protime) (85610-PTP) TLB-PTT (85730-PTTL) Cardiac Catheterization (Cardiac Cath)  Patient Instructions: 1)  Your physician recommends that you schedule a follow-up appointment in:  2)  Your physician recommends that you continue on your current medications as directed. Please refer to the Current Medication list given to you today. 3)   Your physician has requested that you have a cardiac catheterization.  Cardiac catheterization is used to diagnose and/or treat various heart conditions. Doctors may recommend this procedure for a number of different reasons. The most common reason is to evaluate chest pain. Chest pain can be a symptom of coronary artery disease (CAD), and cardiac catheterization can show whether plaque is narrowing or blocking your heart's arteries. This procedure is also used to evaluate the valves, as well as measure the blood flow and oxygen levels in different parts of your heart.  For further information please visit [email or web address]  Please follow instruction sheet, as given.

## 2010-11-08 ENCOUNTER — Encounter: Payer: Self-pay | Admitting: Cardiovascular Disease

## 2010-11-08 ENCOUNTER — Encounter (INDEPENDENT_AMBULATORY_CARE_PROVIDER_SITE_OTHER): Payer: BLUE CROSS/BLUE SHIELD

## 2010-11-08 DIAGNOSIS — I714 Abdominal aortic aneurysm, without rupture: Secondary | ICD-10-CM

## 2010-11-08 DIAGNOSIS — I700 Atherosclerosis of aorta: Secondary | ICD-10-CM

## 2010-11-12 NOTE — Procedures (Signed)
NAME:  Eric Gordon, Eric Gordon NO.:  [id number]  MEDICAL RECORD NO.:  [id number]           PATIENT TYPE:  LOCATION:                                 FACILITY:  PHYSICIAN:  Noralyn Pick. Eden Emms, MD, FACCDATE OF BIRTH:  1930/10/21  DATE OF PROCEDURE: DATE OF DISCHARGE:                           CARDIAC CATHETERIZATION   A 77 year old patient of Dr. Daleen Squibb with chest pain and shortness of breath.  Cine catheterization was done from the right femoral artery and vein. We chose to use the femoral approach because the patient has significant shortness of breath and needed a right heart catheterization.  We did have some difficulty negotiating the right femoral artery.  This turned out to be because of a distal abdominal aortic aneurysm, and we are able to successfully cross this area with a Wholey wire and right coronary catheter.  Catheter exchanges were then used throughout the procedure.  Left main coronary artery had 20% discrete stenosis.  Proximal LAD had a 30% to 40% tubular lesion.  Mid and distal LAD were normal.  The LAD was large and wrapped the apex.  First diagonal branch was small.  It had diffuse 70% to 80% disease from the ostium through the first centimeter and half proximally.  There was a focal area of 70% stenosis as well.  Second diagonal branch was normal.  Circumflex coronary artery was nondominant.  The proximal, mid, distal AV groove branch were normal.  First obtuse marginal branch had a 40% tubular lesion.  The right coronary artery was dominant.  The mid to distal vessel had an eccentric area of 70% stenosis.  RAO ventriculography:  RAO ventriculography was normal.  EF of 65%. There was no gradient across the aortic valve and no MR.  Right heart catheterization was done due to dyspnea.  Mean right atrial pressure was 11, RV pressure was 35/11, PA pressure was 35/21, mean pulmonary capillary wedge pressure was 18, LV pressure was 155/90, aortic  pressure was 155/84.  Cardiac output was 6 L per minute with a cardiac index of 2.5 L per minute per meter squared.  Distal aortogram was done due to possibility of aortic aneurysm.  The patient's father actually died of an aortic aneurysm.  The patient had a saccular AAA that was infrarenal.  It appeared moderate in size.  IMPRESSION:  The patient has significant disease in the mid to distal right coronary artery and first diagonal branch.  I think the first diagonal branch is diffusely diseased and less than a 2-mm vessel and would be treated medically.  The patient's right coronary artery is borderline in some views; however, I suspect it will need to be intervened on.  I will have the films reviewed by Dr. Riley Kill and Dr. Excell Seltzer.  The patient will be started on Plavix.  He will follow up with Dr. Daleen Squibb in the office to arrange either stress Myoview or PCI depending on interventional review.  We will also order an abdominal ultrasound to size his aortic aneurysm which will need serial followup.  The patient tolerated the diagnostic procedure well.     Noralyn Pick. Eden Emms, MD, [facility]  PCN/MEDQ  D:  11/04/2010  T:  11/05/2010  Job:  [id number]  cc:   Thomas C. Daleen Squibb, MD, [facility]  Electronically Signed by Charlton Haws MD [facility] on 11/12/2010 09:51:25 AM

## 2010-11-13 NOTE — Miscellaneous (Signed)
Summary: Orders Update  Clinical Lists Changes  Problems: Added new problem of ABDOMINAL AORTIC ANEURYSM (ICD-441.4) Orders: Added new Test order of Abdominal Aorta Duplex (Abd Aorta Duplex) - Signed 

## 2010-11-19 ENCOUNTER — Encounter: Payer: Self-pay | Admitting: Cardiology

## 2010-11-19 ENCOUNTER — Encounter (INDEPENDENT_AMBULATORY_CARE_PROVIDER_SITE_OTHER): Payer: Medicare Other | Admitting: Cardiology

## 2010-11-19 ENCOUNTER — Encounter: Payer: Medicare Other | Admitting: Physician Assistant

## 2010-11-19 DIAGNOSIS — I714 Abdominal aortic aneurysm, without rupture: Secondary | ICD-10-CM

## 2010-11-19 DIAGNOSIS — R0602 Shortness of breath: Secondary | ICD-10-CM

## 2010-11-21 HISTORY — PX: CORONARY ARTERY BYPASS GRAFT: SHX141

## 2010-11-21 HISTORY — PX: CARDIAC CATHETERIZATION: SHX172

## 2010-11-28 NOTE — Letter (Signed)
Summary: Cardiac Catheterization Instructions- Main Lab  [employer], Main Office  [address]   [place], [place] [address]   Phone: [phone]  Fax: [phone]     11/19/2010 MRN: [id number]  Eric Gordon [address] [place], [place]  [address]  [place]  Dear Mr. Eric Gordon, Eric Gordon are scheduled for Cardiac Catheterization on March 15,2012              with Dr. Riley Kill.  Please arrive at the [facility] at7:30      a.m. on the day of your procedure.  1. DIET     ___x_ Nothing to eat or drink after midnight except your medications with a sip of water.  2. Come to the [place] office on 12/02/10 for lab work.  The lab at [facility] is open from 8:30 a.m. to 1:30 p.m. and 2:30 p.m. to 5:00 p.m.  The lab at [facility] is open from 7:30 a.m. to 5:30 p.m.  You do not have to be fasting.  3. MAKE SURE YOU TAKE YOUR ASPIRIN.       __x_ YOU MAY TAKE ALL of your remaining medications with a small amount of water.  4. Plan for one night stay - bring personal belongings (i.e. toothpaste, toothbrush, etc.)  5. Bring a current list of your medications and current insurance cards.  6. Must have a responsible person to drive you home.   8. Someone must be with you for the first 24 hours after you arrive home.  9. Please wear clothes that are easy to get on and off and wear slip-on shoes.  *Special note: Every effort is made to have your procedure done on time.  Occasionally there are emergencies that present themselves at the hospital that may cause delays.  Please be patient if a delay does occur.  If you have any questions after you get home, please call the office at the number listed above.  Eric Devoid RN

## 2010-11-28 NOTE — Assessment & Plan Note (Signed)
Summary: eph. per mark jv lab.gd   Visit Type:  follow-up from cath Referring Provider:  Creola Corn Primary Provider:  Creola Corn, MD  CC:  sob. pt has no other complaints.  History of Present Illness: Mr Capo returns today because of increased DOE. Cat demonstrated at least a 70% stenosis in the RCA. He also has some disease in the LCX. Stress Echo showed ischemia in the inferior Kimo Bancroft. Followup abdominal US showed a 3.3 x 3.2 infrarenal AAA. He had a bad day with DOE with minimal activity.  Clinical Reports Reviewed:  Cardiac Cath:  11/08/2010: Cardiac Cath Findings:   RAO ventriculography:  RAO ventriculography was normal.  EF of 65%. There was no gradient across the aortic valve and no MR.   IMPRESSION:  The patient has significant disease in the mid to distal   right coronary artery and first diagonal branch.  I think the first   diagonal branch is diffusely diseased and less than a 2-mm vessel and   would be treated medically.  The patient's right coronary artery is   borderline in some views; however, I suspect it will need to be   intervened on.  I will have the films reviewed by Dr. Riley Kill and Dr.   Excell Seltzer.  The patient will be started on Plavix.      He will follow up with Dr. Daleen Squibb in the office to arrange either stress   Myoview or PCI depending on interventional review.  We will also order   an abdominal ultrasound to size his aortic aneurysm which will need   serial followup.      The patient tolerated the diagnostic procedure well.             Noralyn Pick. Eden Emms, MD, [facility]            Current Medications (verified): 1)  Symbicort 160-4.5 Mcg/act Aero (Budesonide-Formoterol Fumarate) .... Two Puffs Two Times A Day 2)  Combivent 18-103 Mcg/act Aero (Ipratropium-Albuterol) .... Use As Needed 3)  Fish Oil   Oil (Fish Oil) .Marland Kitchen.. 1 Tablet By Mouth Once Daily 4)  Celebrex 200 Mg Caps (Celecoxib) .Marland Kitchen.. 1 Tablet By Mouth Once Daily 5)  Cozaar 100 Mg Tabs (Losartan  Potassium) .Marland Kitchen.. 1 Tablet By Mouth Once Daily 6)  Adult Aspirin Ec Low Strength 81 Mg Tbec (Aspirin) .Marland Kitchen.. 1 Tablet By Mouth Once Daily 7)  Simvastatin 40 Mg Tabs (Simvastatin) .Marland Kitchen.. 1 Tablet By Mouth Once Daily 8)  Flomax 0.4 Mg Xr24h-Cap (Tamsulosin Hcl) .Marland Kitchen.. 1 Tablet By Mouth Once Daily 9)  Omeprazole 20 Mg Cpdr (Omeprazole) .Marland Kitchen.. 1 Tablet By Mouth Once Daily 10)  Vitamin C 500 Mg  Tabs (Ascorbic Acid) .Marland Kitchen.. 1 Tablet By Mouth Once Daily 11)  Vitamin D 1000 Unit  Tabs (Cholecalciferol) .Marland Kitchen.. 1 Tablet By Mouth Once Daily 12)  Hydrochlorothiazide 25 Mg Tabs (Hydrochlorothiazide) .... Take 1 Tablet By Mouth Once A Day  Allergies (verified): No Known Drug Allergies  Past History:  Past Medical History: Last updated: 04/10/2010 Dyspnea 2nd to asthma and diastolic dysfunction      - PFT 02/08/10 FEV1 2.93(76%), FEV1% 73, TLC 6.51(78%), DLCO 67%, Positive BD response Pneumonia March 2011 Hypertension.  Grade 1 diastolic dysfunction      - Echo May 2011 Hyperlipidemia.  Hypertriglyceridemia.  GERD with Barrett esophagus.  Colon polyps.  Mild diverticulosis.  Degenerative low back osteoarthritis.  Severe OSA, on CPAP.  BPH with urinary obstruction symptoms.  Elevated PSA with negative biopsy.  ED with  a penile pump.  Overactive bladder.  Nephrolithiasis.  Left carotid bruit, secondary ECA stenosis.  OA.  Atypical chest pain.  Plantar fasciitis.  Fungal dermatitis.  Bilateral ICA stenosis of 1-39% in 2005 and 2009 without any change.  Benign essential tremor.   Past Surgical History: Last updated: March 02, 2010  1. Penile implant.   2. Right rotator cuff surgery.   3. Right Achilles heal operation in January 2006.   4. Ankle surgery.   5. Inguinal hernia in 1997.   6. history of knee surgery.   Family History: Last updated: 03/02/10 Father died at the age of 57 of AAA.  Mother died at the age 62 with coronary disease and hypertension.  Siblings will include medical diagnosis of  diabetes, coronary artery disease, CABG, peripheral vascular disease, and hypertension.   Social History: Last updated: 03/02/2010 Married to [place], has two sons.  Works in Airline pilot.   Quit smoking in 1985.  Started age 43, and smoked 1.5 packs per day.  Risk Factors: Caffeine Use: 3 (04/10/2010)  Risk Factors: Smoking Status: quit (04/10/2010) Passive Smoke Exposure: no (04/10/2010)  Review of Systems       NEGATIVE OTHER THAn HPI  Vital Signs:  Patient profile:   77 year old male Height:      75.5 inches Weight:      247.25 pounds BMI:     30.61 Pulse rate:   76 / minute Resp:     19 per minute BP sitting:   138 / 88  (left arm)  Vitals Entered By: Celestia Khat, CMA (November 19, 2010 3:07 PM)  Physical Exam  General:  Well developed, well nourished, in no acute distress. Head:  normocephalic and atraumatic Eyes:  PERRLA/EOM intact; conjunctiva and lids normal. Neck:  Neck supple, no JVD. No masses, thyromegaly or abnormal cervical nodes. Chest Lindsi Bayliss:  no deformities or breast masses noted Lungs:  Clear bilaterally to auscultation and percussion. Heart:  Non-displaced PMI, chest non-tender; regular rate and rhythm, S1, S2 without murmurs, rubs or gallops. Carotid upstroke normal, no bruit. Normal abdominal aortic size, no bruits. Femorals normal pulses, no bruits. Pedals normal pulses. No edema, no varicosities. Msk:  Back normal, normal gait. Muscle strength and tone normal. Pulses:  pulses normal in all 4 extremities Extremities:  No clubbing or cyanosis. Neurologic:  Alert and oriented x 3. Skin:  Intact without lesions or rashes. Psych:  Normal affect.   Impression & Recommendations:  Problem # 1:  DYSPNEA (ICD-786.05) Assessment Deteriorated Long discussion with the patient, wife and Dr Riley Kill. Will arrange PCI of RCA. Will also relook at LCX as well. His updated medication list for this problem includes:    Cozaar 100 Mg Tabs (Losartan potassium) .Marland Kitchen... 1  tablet by mouth once daily    Adult Aspirin Ec Low Strength 81 Mg Tbec (Aspirin) .Marland Kitchen... 1 tablet by mouth once daily    Hydrochlorothiazide 25 Mg Tabs (Hydrochlorothiazide) .Marland Kitchen... Take 1 tablet by mouth once a day  Problem # 2:  ABDOMINAL AORTIC ANEURYSM (ICD-441.4) Assessment: New Followup in 1 year. Results reviewed with the patient and wife.  Patient Instructions: 1)  Your physician recommends that you continue on your current medications as directed. Please refer to the Current Medication list given to you today. 2)  Your physician has recommended that you have a heart catheterization.  After this is performed, and if a blockage is found, an angioplasty or stenting procedure may be necessary.

## 2010-12-02 ENCOUNTER — Other Ambulatory Visit (INDEPENDENT_AMBULATORY_CARE_PROVIDER_SITE_OTHER): Payer: BLUE CROSS/BLUE SHIELD

## 2010-12-02 ENCOUNTER — Other Ambulatory Visit: Payer: Self-pay | Admitting: Cardiology

## 2010-12-02 ENCOUNTER — Encounter: Payer: Self-pay | Admitting: Cardiology

## 2010-12-02 DIAGNOSIS — R0602 Shortness of breath: Secondary | ICD-10-CM

## 2010-12-02 DIAGNOSIS — I714 Abdominal aortic aneurysm, without rupture: Secondary | ICD-10-CM

## 2010-12-02 DIAGNOSIS — I10 Essential (primary) hypertension: Secondary | ICD-10-CM

## 2010-12-02 LAB — BASIC METABOLIC PANEL
BUN: 26 mg/dL — ABNORMAL HIGH (ref 6–23)
Creatinine, Ser: 1.1 mg/dL (ref 0.4–1.5)
GFR: 69.68 mL/min (ref >60.00)
Potassium: 4.4 mEq/L (ref 3.5–5.1)

## 2010-12-02 LAB — CBC WITH DIFFERENTIAL/PLATELET
Eosinophils Relative: 2 % (ref 0.0–5.0)
HCT: 45 % (ref 39.0–52.0)
Monocytes Relative: 11.9 % (ref 3.0–12.0)
Neutrophils Relative %: 62.1 % (ref 43.0–77.0)
Platelets: 160 10*3/uL (ref 150.0–400.0)
WBC: 8.1 10*3/uL (ref 4.5–10.5)

## 2010-12-02 LAB — PROTIME-INR
INR: 1 ratio (ref 0.8–1.0)
Prothrombin Time: 11.6 s (ref 10.2–12.4)

## 2010-12-02 LAB — APTT: aPTT: 24.9 s (ref 21.7–28.8)

## 2010-12-03 NOTE — Letter (Signed)
Summary: [facility] Annual Physical Exam   [facility] Annual Physical Exam   Imported By: Roderic Ovens 11/26/2010 15:26:50  _____________________________________________________________________  External Attachment:    Type:   Image     Comment:   External Document

## 2010-12-03 NOTE — Cardiovascular Report (Signed)
Summary: Pre-Cath Orders  Pre-Cath Orders   Imported By: Marylou Mccoy 11/28/2010 16:17:58  _____________________________________________________________________  External Attachment:    Type:   Image     Comment:   External Document

## 2010-12-05 ENCOUNTER — Observation Stay (HOSPITAL_COMMUNITY)
Admission: RE | Admit: 2010-12-05 | Discharge: 2010-12-06 | Disposition: A | Discharge status: Home / Self Care | Payer: Medicare Other | Source: Ambulatory Visit | Attending: Cardiology | Admitting: Cardiology

## 2010-12-05 DIAGNOSIS — G4733 Obstructive sleep apnea (adult) (pediatric): Secondary | ICD-10-CM | POA: Insufficient documentation

## 2010-12-05 DIAGNOSIS — Z01810 Encounter for preprocedural cardiovascular examination: Secondary | ICD-10-CM

## 2010-12-05 DIAGNOSIS — I2510 Atherosclerotic heart disease of native coronary artery without angina pectoris: Secondary | ICD-10-CM

## 2010-12-05 DIAGNOSIS — N400 Enlarged prostate without lower urinary tract symptoms: Secondary | ICD-10-CM | POA: Insufficient documentation

## 2010-12-05 DIAGNOSIS — I519 Heart disease, unspecified: Secondary | ICD-10-CM | POA: Insufficient documentation

## 2010-12-05 DIAGNOSIS — E785 Hyperlipidemia, unspecified: Secondary | ICD-10-CM | POA: Insufficient documentation

## 2010-12-05 DIAGNOSIS — I10 Essential (primary) hypertension: Secondary | ICD-10-CM | POA: Insufficient documentation

## 2010-12-05 DIAGNOSIS — K219 Gastro-esophageal reflux disease without esophagitis: Secondary | ICD-10-CM | POA: Insufficient documentation

## 2010-12-05 DIAGNOSIS — Z01811 Encounter for preprocedural respiratory examination: Secondary | ICD-10-CM

## 2010-12-05 DIAGNOSIS — R0989 Other specified symptoms and signs involving the circulatory and respiratory systems: Secondary | ICD-10-CM | POA: Insufficient documentation

## 2010-12-05 DIAGNOSIS — R0609 Other forms of dyspnea: Secondary | ICD-10-CM | POA: Insufficient documentation

## 2010-12-05 DIAGNOSIS — E781 Pure hyperglyceridemia: Secondary | ICD-10-CM | POA: Insufficient documentation

## 2010-12-05 DIAGNOSIS — N529 Male erectile dysfunction, unspecified: Secondary | ICD-10-CM | POA: Insufficient documentation

## 2010-12-05 DIAGNOSIS — I200 Unstable angina: Secondary | ICD-10-CM | POA: Insufficient documentation

## 2010-12-05 LAB — POCT ACTIVATED CLOTTING TIME: Activated Clotting Time: 123 seconds

## 2010-12-06 ENCOUNTER — Observation Stay (HOSPITAL_COMMUNITY): Payer: Medicare Other

## 2010-12-06 ENCOUNTER — Other Ambulatory Visit: Payer: Self-pay | Admitting: Cardiothoracic Surgery

## 2010-12-06 DIAGNOSIS — Z01811 Encounter for preprocedural respiratory examination: Secondary | ICD-10-CM

## 2010-12-06 DIAGNOSIS — R0602 Shortness of breath: Secondary | ICD-10-CM

## 2010-12-06 DIAGNOSIS — I517 Cardiomegaly: Secondary | ICD-10-CM

## 2010-12-06 DIAGNOSIS — I200 Unstable angina: Secondary | ICD-10-CM

## 2010-12-06 DIAGNOSIS — I209 Angina pectoris, unspecified: Secondary | ICD-10-CM

## 2010-12-06 DIAGNOSIS — R942 Abnormal results of pulmonary function studies: Secondary | ICD-10-CM

## 2010-12-06 DIAGNOSIS — I2510 Atherosclerotic heart disease of native coronary artery without angina pectoris: Secondary | ICD-10-CM

## 2010-12-06 LAB — COMPREHENSIVE METABOLIC PANEL
ALT: 18 U/L (ref 0–53)
AST: 21 U/L (ref 0–37)
Albumin: 3.8 g/dL (ref 3.5–5.2)
Alkaline Phosphatase: 58 U/L (ref 39–117)
BUN: 17 mg/dL (ref 6–23)
CO2: 29 mEq/L (ref 19–32)
Calcium: 9.3 mg/dL (ref 8.4–10.5)
Chloride: 106 mEq/L (ref 96–112)
Creatinine, Ser: 1.15 mg/dL (ref 0.4–1.5)
GFR calc Af Amer: >60 mL/min (ref >60)
GFR calc non Af Amer: >60 mL/min (ref >60)
Glucose, Bld: 97 mg/dL (ref 70–99)
Potassium: 3.9 mEq/L (ref 3.5–5.1)
Sodium: 140 mEq/L (ref 135–145)
Total Bilirubin: 0.8 mg/dL (ref 0.3–1.2)
Total Protein: 7.1 g/dL (ref 6.0–8.3)

## 2010-12-06 LAB — TYPE AND SCREEN
ABO/RH(D): A POS
Antibody Screen: NEGATIVE

## 2010-12-06 LAB — URINALYSIS, ROUTINE W REFLEX MICROSCOPIC
Bilirubin Urine: NEGATIVE
Glucose, UA: NEGATIVE mg/dL
Hgb urine dipstick: NEGATIVE
Ketones, ur: NEGATIVE mg/dL
Nitrite: NEGATIVE
Protein, ur: NEGATIVE mg/dL
Specific Gravity, Urine: 1.014 (ref 1.005–1.030)
Urobilinogen, UA: 0.2 mg/dL (ref 0.0–1.0)
pH: 7 (ref 5.0–8.0)

## 2010-12-06 LAB — BLOOD GAS, ARTERIAL
Acid-Base Excess: 2.7 mmol/L — ABNORMAL HIGH (ref 0.0–2.0)
Bicarbonate: 26.6 mEq/L — ABNORMAL HIGH (ref 20.0–24.0)
Drawn by: 29024
FIO2: 21 %
O2 Saturation: 94.7 %
Patient temperature: 97.7
TCO2: 27.8 mmol/L (ref 0–100)
pCO2 arterial: 39.4 mmHg (ref 35.0–45.0)
pH, Arterial: 7.441 (ref 7.350–7.450)
pO2, Arterial: 67 mmHg — ABNORMAL LOW (ref 80.0–100.0)

## 2010-12-06 LAB — HEMOGLOBIN A1C
Hgb A1c MFr Bld: 5.8 % — ABNORMAL HIGH (ref <5.7)
Mean Plasma Glucose: 120 mg/dL — ABNORMAL HIGH (ref <117)

## 2010-12-06 LAB — CBC
HCT: 49.8 % (ref 39.0–52.0)
Hemoglobin: 16.5 g/dL (ref 13.0–17.0)
MCH: 32 pg (ref 26.0–34.0)
MCHC: 33.1 g/dL (ref 30.0–36.0)
MCV: 96.5 fL (ref 78.0–100.0)
Platelets: 155 10*3/uL (ref 150–400)
RBC: 5.16 MIL/uL (ref 4.22–5.81)
RDW: 13.2 % (ref 11.5–15.5)
WBC: 8.5 10*3/uL (ref 4.0–10.5)

## 2010-12-06 LAB — PLATELET FUNCTION ASSAY: Collagen / Epinephrine: 152 seconds (ref 0–184)

## 2010-12-06 LAB — PROTIME-INR
INR: 0.99 (ref 0.00–1.49)
Prothrombin Time: 13.3 seconds (ref 11.6–15.2)

## 2010-12-06 LAB — APTT: aPTT: 27 seconds (ref 24–37)

## 2010-12-06 LAB — ABO/RH: ABO/RH(D): A POS

## 2010-12-09 NOTE — Consult Note (Signed)
NAME:  Eric Gordon, Eric Gordon NO.:  [id number]  MEDICAL RECORD NO.:  [id number]           PATIENT TYPE:  O  LOCATION:  6526                         FACILITY:  [facility]  PHYSICIAN:  Sheliah Plane, MD    DATE OF BIRTH:  05-02-1931  DATE OF CONSULTATION:  12/05/2010 DATE OF DISCHARGE:                                CONSULTATION   REQUESTING PHYSICIAN:  Arturo Morton. Riley Kill, MD, [facility]  FOLLOWUP CARDIOLOGIST:  Jesse Sans. Daleen Squibb, MD, [facility]  PRIMARY CARE PHYSICIAN:  Gwen Pounds, MD  REASON FOR CONSULTATION:  Three-vessel coronary artery disease with positive stress test.  HISTORY OF PRESENT ILLNESS:  The patient is a 77 year old male who relates that since an episode of hospitalization about 1 year ago for pneumonia, he has had increasing episodes of shortness of breath, usually associated with exertion.  He also has some exertional-related substernal chest pain radiating to the arm but not necessarily related to the shortness of breath.  He has had extensive pulmonary workup through the [facility] and ultimately had an echo stress test that showed inferior ischemia and grade 1 diastolic dysfunction with normal systolic ejection fraction.  The patient notes in spite of intensive medical therapy and no evidence of valvular disease, he has continued to have symptoms and in mid February underwent cardiac catheterization.  He returns today for repeat cardiac catheterization by Dr. Riley Kill which demonstrated a three-vessel coronary artery disease and is referred for consideration of coronary artery bypass grafting.  PAST MEDICAL HISTORY:  The patient denies diabetes.  Does have a history of hypertension.  Has no previous history of myocardial infarction. Denies renal insufficiency.  Denies claudication.  Has known cerebrovascular disease with asymptomatic left carotid stenosis of 60%- 80%.  In addition, he has a history, as noted, of pneumonia in March 2011,  hyperlipidemia, history of GERD with Barrett esophagus, history of colon polyps, history of diverticulosis, chronic use of CPAP at night, BPH with urinary obstructive symptoms, elevated PSA with negative biopsy, ED with penile pump, nephrolithiasis, left carotid bruit.  PAST SURGICAL HISTORY:  Penile implant, rotator cuff surgery on the right, Achilles tendon repair in January 2006, inguinal hernia in 1997.  FAMILY HISTORY:  The patient's father died at age 29 of abdominal aneurysm.  The patient's mother died at 54 of coronary artery disease and hypertension.  He has siblings that have diabetes and coronary artery disease and have had bypass surgery and hypertension.  SOCIAL HISTORY:  The patient is married, has two sons.  Works in Publishing rights manager.  Quit smoking in 1985.  Prior to that, he smoked and one and a half packs a day for many years.  REVIEW OF SYSTEMS:  The patient does have constitutional symptoms, primarily fatigue.  Denies fever, chills or night sweats.  Denies hemoptysis.  Denies change in bowel habits.  Denies blood in his stool or urine.  Denies amaurosis or TIAs.  Other review of systems are negative.  The patient is 75.5 inches tall, 247 pounds, BMI is 30.6 resulting in moderate obesity.  PHYSICAL EXAMINATION:  GENERAL:  The patient is awake, alert, neurologically intact. HEENT:  Pupils equal, round, reactive to light. NECK:  He has a slight left carotid bruit, none on the right. LUNGS:  Clear bilaterally. CARDIAC:  Reveals a regular rate and rhythm without murmur or gallop. ABDOMEN:  Benign.  He has a known abdominal aortic aneurysm about 3.3 cm in size but it is not easily palpable. LOWER EXTREMITIES:  Adequate vein for bypass.  There is 2+ DP and PT pulses bilaterally.  LABORATORY FINDINGS: No Labs done yet. Will obtain CBC,BMP,PT,PTT in am. Carotid Dopplers that show 60-80% internal carotid artery stenosis, high-grade external carotid stenosis on the  left, plaque on the right but without high-grade stenosis.  The patient has no laboratory findings in the computer system prior to cath.  Cardiac catheterization films are reviewed from March and January.  The patient has a 70% mid right coronary lesion, 80% proximal left main with a bifurcation of the diagonal at 70% and has a 70% proximal ramus branch stenosis.  The first obtuse marginal has a long 70-80% stenosis. Overall ejection fraction is preserved.  IMPRESSION:  The patient with symptomatic dyspnea with positive stress test and three-vessel coronary artery disease.  Previous workup includes negative CT scan for pulmonary emboli.  Echocardiogram reveals no evidence of valvular heart disease.  In addition, the patient has abdominal aortic aneurysm, small, and cerebrovascular disease.  After reviewing the films and the patient's symptoms, I agree with Dr. Riley Kill that with the significant three-vessel disease and technical difficulties with dilating the ramus, LAD, and diagonal, coronary artery bypass grafting offers the best long-term treatment.  The risks and options of surgery including death, infection, stroke, myocardial infarction, bleeding, blood transfusion were all reviewed with the patient and his wife in detail.  He is willing to proceed.  We have reviewed his current medication list.  I have asked him to continue the aspirin 81 mg a day, stop his Celebrex, stop the fish oil and 36 hours before surgery stop losartan.  He will continue his other medications as listed in his hospital med reconciliation list.  He is currently not on a beta-blocker presumably because Dr. Craige Cotta had wanted to avoid this with his question of asthma.  We will plan for bypass surgery on Wednesday, December 11, 2010.  Addendum: CBC shows elevated Hgb 16.5 and HCT 49%. Will obstain formal Echo (had previos stress echo) to ro intracardiac shunt and ask Dr Lucious Groves to see again prior to CABG   Sheliah Plane, MD  Copy to Dr Juanito Doom Copy to Dr Clayton Lefort   EG/MEDQ  D:  12/05/2010  T:  12/06/2010  Job:  [id number]  Electronically Signed by Sheliah Plane MD on 12/09/2010 09:10:04 AM

## 2010-12-10 ENCOUNTER — Encounter (HOSPITAL_COMMUNITY)
Admission: RE | Admit: 2010-12-10 | Discharge: 2010-12-10 | Disposition: A | Discharge status: Home / Self Care | Payer: Medicare Other | Source: Ambulatory Visit | Attending: Cardiothoracic Surgery | Admitting: Cardiothoracic Surgery

## 2010-12-10 ENCOUNTER — Inpatient Hospital Stay (HOSPITAL_COMMUNITY)
Admission: RE | Admit: 2010-12-10 | Discharge: 2010-12-10 | Disposition: A | Discharge status: Home / Self Care | Payer: BLUE CROSS/BLUE SHIELD | Source: Ambulatory Visit | Attending: Cardiothoracic Surgery | Admitting: Cardiothoracic Surgery

## 2010-12-10 LAB — SURGICAL PCR SCREEN
MRSA, PCR: NEGATIVE
Staphylococcus aureus: NEGATIVE

## 2010-12-10 LAB — TYPE AND SCREEN
ABO/RH(D): A POS
Antibody Screen: NEGATIVE

## 2010-12-11 ENCOUNTER — Inpatient Hospital Stay (HOSPITAL_COMMUNITY): Payer: Medicare Other

## 2010-12-11 ENCOUNTER — Inpatient Hospital Stay (HOSPITAL_COMMUNITY)
Admission: RE | Admit: 2010-12-11 | Discharge: 2010-12-21 | DRG: 236 | Disposition: A | Discharge status: Home Health | Payer: Medicare Other | Source: Ambulatory Visit | Attending: Cardiothoracic Surgery | Admitting: Cardiothoracic Surgery

## 2010-12-11 DIAGNOSIS — IMO0002 Reserved for concepts with insufficient information to code with codable children: Secondary | ICD-10-CM | POA: Diagnosis not present

## 2010-12-11 DIAGNOSIS — D62 Acute posthemorrhagic anemia: Secondary | ICD-10-CM | POA: Diagnosis not present

## 2010-12-11 DIAGNOSIS — R5381 Other malaise: Secondary | ICD-10-CM | POA: Diagnosis present

## 2010-12-11 DIAGNOSIS — I200 Unstable angina: Secondary | ICD-10-CM | POA: Diagnosis present

## 2010-12-11 DIAGNOSIS — G4733 Obstructive sleep apnea (adult) (pediatric): Secondary | ICD-10-CM | POA: Diagnosis present

## 2010-12-11 DIAGNOSIS — I2510 Atherosclerotic heart disease of native coronary artery without angina pectoris: Principal | ICD-10-CM | POA: Diagnosis present

## 2010-12-11 DIAGNOSIS — Z7982 Long term (current) use of aspirin: Secondary | ICD-10-CM

## 2010-12-11 DIAGNOSIS — E785 Hyperlipidemia, unspecified: Secondary | ICD-10-CM | POA: Diagnosis present

## 2010-12-11 LAB — CBC
HCT: 37.9 % — ABNORMAL LOW (ref 39.0–52.0)
HCT: 39.1 % (ref 39.0–52.0)
Hemoglobin: 12.8 g/dL — ABNORMAL LOW (ref 13.0–17.0)
Hemoglobin: 13 g/dL (ref 13.0–17.0)
MCH: 31.9 pg (ref 26.0–34.0)
MCH: 31.7 pg (ref 26.0–34.0)
MCHC: 33.8 g/dL (ref 30.0–36.0)
MCHC: 33.2 g/dL (ref 30.0–36.0)
MCV: 94.5 fL (ref 78.0–100.0)
MCV: 95.4 fL (ref 78.0–100.0)
Platelets: 96 10*3/uL — ABNORMAL LOW (ref 150–400)
Platelets: 111 10*3/uL — ABNORMAL LOW (ref 150–400)
RBC: 4.01 MIL/uL — ABNORMAL LOW (ref 4.22–5.81)
RBC: 4.1 MIL/uL — ABNORMAL LOW (ref 4.22–5.81)
RDW: 12.8 % (ref 11.5–15.5)
RDW: 12.9 % (ref 11.5–15.5)
WBC: 16.3 10*3/uL — ABNORMAL HIGH (ref 4.0–10.5)
WBC: 15.2 10*3/uL — ABNORMAL HIGH (ref 4.0–10.5)

## 2010-12-11 LAB — POCT I-STAT 3, ART BLOOD GAS (G3+)
Acid-Base Excess: 1 mmol/L (ref 0.0–2.0)
Acid-base deficit: 1 mmol/L (ref 0.0–2.0)
Bicarbonate: 24.7 mEq/L — ABNORMAL HIGH (ref 20.0–24.0)
Bicarbonate: 22.2 mEq/L (ref 20.0–24.0)
O2 Saturation: 93 %
O2 Saturation: 96 %
Patient temperature: 35.1
Patient temperature: 36.8
TCO2: 26 mmol/L (ref 0–100)
TCO2: 23 mmol/L (ref 0–100)
pCO2 arterial: 32.9 mmHg — ABNORMAL LOW (ref 35.0–45.0)
pCO2 arterial: 30.3 mmHg — ABNORMAL LOW (ref 35.0–45.0)
pH, Arterial: 7.476 — ABNORMAL HIGH (ref 7.350–7.450)
pH, Arterial: 7.472 — ABNORMAL HIGH (ref 7.350–7.450)
pO2, Arterial: 56 mmHg — ABNORMAL LOW (ref 80.0–100.0)
pO2, Arterial: 77 mmHg — ABNORMAL LOW (ref 80.0–100.0)

## 2010-12-11 LAB — POCT I-STAT 4, (NA,K, GLUC, HGB,HCT)
Glucose, Bld: 107 mg/dL — ABNORMAL HIGH (ref 70–99)
HCT: 38 % — ABNORMAL LOW (ref 39.0–52.0)
Hemoglobin: 12.9 g/dL — ABNORMAL LOW (ref 13.0–17.0)
Potassium: 3.5 mEq/L (ref 3.5–5.1)
Sodium: 140 mEq/L (ref 135–145)

## 2010-12-11 LAB — POCT I-STAT, CHEM 8
BUN: 19 mg/dL (ref 6–23)
Calcium, Ion: 1.14 mmol/L (ref 1.12–1.32)
Chloride: 108 mEq/L (ref 96–112)
Creatinine, Ser: 1.1 mg/dL (ref 0.4–1.5)
Glucose, Bld: 132 mg/dL — ABNORMAL HIGH (ref 70–99)
HCT: 39 % (ref 39.0–52.0)
Hemoglobin: 13.3 g/dL (ref 13.0–17.0)
Potassium: 4.1 mEq/L (ref 3.5–5.1)
Sodium: 142 mEq/L (ref 135–145)
TCO2: 21 mmol/L (ref 0–100)

## 2010-12-11 LAB — GLUCOSE, CAPILLARY
Glucose-Capillary: 115 mg/dL — ABNORMAL HIGH (ref 70–99)
Glucose-Capillary: 101 mg/dL — ABNORMAL HIGH (ref 70–99)
Glucose-Capillary: 85 mg/dL (ref 70–99)

## 2010-12-11 LAB — MAGNESIUM: Magnesium: 2.8 mg/dL — ABNORMAL HIGH (ref 1.5–2.5)

## 2010-12-11 LAB — CREATININE, SERUM
Creatinine, Ser: 1.1 mg/dL (ref 0.4–1.5)
GFR calc Af Amer: >60 mL/min (ref >60)
GFR calc non Af Amer: >60 mL/min (ref >60)

## 2010-12-11 LAB — HEMOGLOBIN AND HEMATOCRIT, BLOOD
HCT: 34.3 % — ABNORMAL LOW (ref 39.0–52.0)
Hemoglobin: 11.5 g/dL — ABNORMAL LOW (ref 13.0–17.0)

## 2010-12-11 LAB — PLATELET COUNT: Platelets: 112 10*3/uL — ABNORMAL LOW (ref 150–400)

## 2010-12-11 LAB — PROTIME-INR
INR: 1.37 (ref 0.00–1.49)
Prothrombin Time: 17.1 seconds — ABNORMAL HIGH (ref 11.6–15.2)

## 2010-12-11 LAB — APTT: aPTT: 31 seconds (ref 24–37)

## 2010-12-12 ENCOUNTER — Inpatient Hospital Stay (HOSPITAL_COMMUNITY): Payer: Medicare Other

## 2010-12-12 LAB — GLUCOSE, CAPILLARY
Glucose-Capillary: 109 mg/dL — ABNORMAL HIGH (ref 70–99)
Glucose-Capillary: 116 mg/dL — ABNORMAL HIGH (ref 70–99)
Glucose-Capillary: 126 mg/dL — ABNORMAL HIGH (ref 70–99)
Glucose-Capillary: 127 mg/dL — ABNORMAL HIGH (ref 70–99)
Glucose-Capillary: 134 mg/dL — ABNORMAL HIGH (ref 70–99)

## 2010-12-12 LAB — BASIC METABOLIC PANEL
BUN: 17 mg/dL (ref 6–23)
BUN: 19 mg/dL (ref 6–23)
CO2: 25 mEq/L (ref 19–32)
CO2: 27 mEq/L (ref 19–32)
Calcium: 7.8 mg/dL — ABNORMAL LOW (ref 8.4–10.5)
Calcium: 8.2 mg/dL — ABNORMAL LOW (ref 8.4–10.5)
Chloride: 111 mEq/L (ref 96–112)
Chloride: 102 mEq/L (ref 96–112)
Creatinine, Ser: 1 mg/dL (ref 0.4–1.5)
Creatinine, Ser: 1.22 mg/dL (ref 0.4–1.5)
GFR calc Af Amer: >60 mL/min (ref >60)
GFR calc Af Amer: >60 mL/min (ref >60)
GFR calc non Af Amer: >60 mL/min (ref >60)
GFR calc non Af Amer: 58 mL/min — ABNORMAL LOW (ref >60)
Glucose, Bld: 136 mg/dL — ABNORMAL HIGH (ref 70–99)
Glucose, Bld: 129 mg/dL — ABNORMAL HIGH (ref 70–99)
Potassium: 3.9 mEq/L (ref 3.5–5.1)
Potassium: 3.8 mEq/L (ref 3.5–5.1)
Sodium: 140 mEq/L (ref 135–145)
Sodium: 135 mEq/L (ref 135–145)

## 2010-12-12 LAB — CBC
HCT: 39.1 % (ref 39.0–52.0)
HCT: 41.5 % (ref 39.0–52.0)
Hemoglobin: 12.8 g/dL — ABNORMAL LOW (ref 13.0–17.0)
Hemoglobin: 13.6 g/dL (ref 13.0–17.0)
MCH: 31.4 pg (ref 26.0–34.0)
MCH: 31.6 pg (ref 26.0–34.0)
MCHC: 32.7 g/dL (ref 30.0–36.0)
MCHC: 32.8 g/dL (ref 30.0–36.0)
MCV: 96.1 fL (ref 78.0–100.0)
MCV: 96.5 fL (ref 78.0–100.0)
Platelets: 119 10*3/uL — ABNORMAL LOW (ref 150–400)
Platelets: 111 10*3/uL — ABNORMAL LOW (ref 150–400)
RBC: 4.07 MIL/uL — ABNORMAL LOW (ref 4.22–5.81)
RBC: 4.3 MIL/uL (ref 4.22–5.81)
RDW: 13.1 % (ref 11.5–15.5)
RDW: 13.4 % (ref 11.5–15.5)
WBC: 15.4 10*3/uL — ABNORMAL HIGH (ref 4.0–10.5)
WBC: 18.1 10*3/uL — ABNORMAL HIGH (ref 4.0–10.5)

## 2010-12-12 LAB — ERYTHROPOIETIN: Erythropoietin: 12.6 m[IU]/mL (ref 2.6–34.0)

## 2010-12-12 LAB — MAGNESIUM
Magnesium: 2.3 mg/dL (ref 1.5–2.5)
Magnesium: 2.3 mg/dL (ref 1.5–2.5)

## 2010-12-13 ENCOUNTER — Inpatient Hospital Stay (HOSPITAL_COMMUNITY): Payer: Medicare Other

## 2010-12-13 LAB — POCT I-STAT 4, (NA,K, GLUC, HGB,HCT)
Glucose, Bld: 100 mg/dL — ABNORMAL HIGH (ref 70–99)
Glucose, Bld: 102 mg/dL — ABNORMAL HIGH (ref 70–99)
Glucose, Bld: 113 mg/dL — ABNORMAL HIGH (ref 70–99)
Glucose, Bld: 92 mg/dL (ref 70–99)
Glucose, Bld: 116 mg/dL — ABNORMAL HIGH (ref 70–99)
Glucose, Bld: 118 mg/dL — ABNORMAL HIGH (ref 70–99)
HCT: 43 % (ref 39.0–52.0)
HCT: 38 % — ABNORMAL LOW (ref 39.0–52.0)
HCT: 33 % — ABNORMAL LOW (ref 39.0–52.0)
HCT: 33 % — ABNORMAL LOW (ref 39.0–52.0)
HCT: 34 % — ABNORMAL LOW (ref 39.0–52.0)
HCT: 31 % — ABNORMAL LOW (ref 39.0–52.0)
Hemoglobin: 14.6 g/dL (ref 13.0–17.0)
Hemoglobin: 12.9 g/dL — ABNORMAL LOW (ref 13.0–17.0)
Hemoglobin: 11.2 g/dL — ABNORMAL LOW (ref 13.0–17.0)
Hemoglobin: 11.2 g/dL — ABNORMAL LOW (ref 13.0–17.0)
Hemoglobin: 11.6 g/dL — ABNORMAL LOW (ref 13.0–17.0)
Hemoglobin: 10.5 g/dL — ABNORMAL LOW (ref 13.0–17.0)
Potassium: 4.2 mEq/L (ref 3.5–5.1)
Potassium: 4.2 mEq/L (ref 3.5–5.1)
Potassium: 3.8 mEq/L (ref 3.5–5.1)
Potassium: 3.8 mEq/L (ref 3.5–5.1)
Potassium: 4.1 mEq/L (ref 3.5–5.1)
Potassium: 3.6 mEq/L (ref 3.5–5.1)
Sodium: 142 mEq/L (ref 135–145)
Sodium: 141 mEq/L (ref 135–145)
Sodium: 140 mEq/L (ref 135–145)
Sodium: 141 mEq/L (ref 135–145)
Sodium: 139 mEq/L (ref 135–145)
Sodium: 138 mEq/L (ref 135–145)

## 2010-12-13 LAB — CBC
HCT: 38.4 % — ABNORMAL LOW (ref 39.0–52.0)
Hemoglobin: 12.3 g/dL — ABNORMAL LOW (ref 13.0–17.0)
MCH: 30.8 pg (ref 26.0–34.0)
MCHC: 32 g/dL (ref 30.0–36.0)
MCV: 96 fL (ref 78.0–100.0)
Platelets: 104 10*3/uL — ABNORMAL LOW (ref 150–400)
RBC: 4 MIL/uL — ABNORMAL LOW (ref 4.22–5.81)
RDW: 13.2 % (ref 11.5–15.5)
WBC: 16.9 10*3/uL — ABNORMAL HIGH (ref 4.0–10.5)

## 2010-12-13 LAB — BASIC METABOLIC PANEL
BUN: 20 mg/dL (ref 6–23)
CO2: 27 mEq/L (ref 19–32)
Calcium: 7.9 mg/dL — ABNORMAL LOW (ref 8.4–10.5)
Chloride: 100 mEq/L (ref 96–112)
Creatinine, Ser: 1.11 mg/dL (ref 0.4–1.5)
GFR calc Af Amer: >60 mL/min (ref >60)
GFR calc non Af Amer: >60 mL/min (ref >60)
Glucose, Bld: 130 mg/dL — ABNORMAL HIGH (ref 70–99)
Potassium: 3.9 mEq/L (ref 3.5–5.1)
Sodium: 132 mEq/L — ABNORMAL LOW (ref 135–145)

## 2010-12-13 LAB — GLUCOSE, CAPILLARY
Glucose-Capillary: 128 mg/dL — ABNORMAL HIGH (ref 70–99)
Glucose-Capillary: 141 mg/dL — ABNORMAL HIGH (ref 70–99)
Glucose-Capillary: 99 mg/dL (ref 70–99)
Glucose-Capillary: 117 mg/dL — ABNORMAL HIGH (ref 70–99)

## 2010-12-13 LAB — POCT I-STAT 3, ART BLOOD GAS (G3+)
Acid-Base Excess: 3 mmol/L — ABNORMAL HIGH (ref 0.0–2.0)
Acid-Base Excess: 1 mmol/L (ref 0.0–2.0)
Bicarbonate: 26.6 mEq/L — ABNORMAL HIGH (ref 20.0–24.0)
Bicarbonate: 25.4 mEq/L — ABNORMAL HIGH (ref 20.0–24.0)
O2 Saturation: 100 %
O2 Saturation: 100 %
TCO2: 28 mmol/L (ref 0–100)
TCO2: 27 mmol/L (ref 0–100)
pCO2 arterial: 35 mmHg (ref 35.0–45.0)
pCO2 arterial: 37.2 mmHg (ref 35.0–45.0)
pH, Arterial: 7.489 — ABNORMAL HIGH (ref 7.350–7.450)
pH, Arterial: 7.443 (ref 7.350–7.450)
pO2, Arterial: 264 mmHg — ABNORMAL HIGH (ref 80.0–100.0)
pO2, Arterial: 202 mmHg — ABNORMAL HIGH (ref 80.0–100.0)

## 2010-12-13 LAB — POCT I-STAT GLUCOSE
Glucose, Bld: 102 mg/dL — ABNORMAL HIGH (ref 70–99)
Operator id: 238831

## 2010-12-14 ENCOUNTER — Inpatient Hospital Stay (HOSPITAL_COMMUNITY): Payer: Medicare Other

## 2010-12-14 DIAGNOSIS — I4891 Unspecified atrial fibrillation: Secondary | ICD-10-CM

## 2010-12-14 LAB — BASIC METABOLIC PANEL
BUN: 20 mg/dL (ref 6–23)
CO2: 26 mEq/L (ref 19–32)
Calcium: 8.4 mg/dL (ref 8.4–10.5)
Chloride: 101 mEq/L (ref 96–112)
Creatinine, Ser: 1.08 mg/dL (ref 0.4–1.5)
GFR calc Af Amer: >60 mL/min (ref >60)
GFR calc non Af Amer: >60 mL/min (ref >60)
Glucose, Bld: 128 mg/dL — ABNORMAL HIGH (ref 70–99)
Potassium: 3.9 mEq/L (ref 3.5–5.1)
Sodium: 135 mEq/L (ref 135–145)

## 2010-12-14 LAB — CBC
HCT: 38.4 % — ABNORMAL LOW (ref 39.0–52.0)
Hemoglobin: 12.5 g/dL — ABNORMAL LOW (ref 13.0–17.0)
MCH: 31.4 pg (ref 26.0–34.0)
MCHC: 32.6 g/dL (ref 30.0–36.0)
MCV: 96.5 fL (ref 78.0–100.0)
Platelets: 104 10*3/uL — ABNORMAL LOW (ref 150–400)
RBC: 3.98 MIL/uL — ABNORMAL LOW (ref 4.22–5.81)
RDW: 13.3 % (ref 11.5–15.5)
WBC: 14.3 10*3/uL — ABNORMAL HIGH (ref 4.0–10.5)

## 2010-12-14 LAB — GLUCOSE, CAPILLARY
Glucose-Capillary: 133 mg/dL — ABNORMAL HIGH (ref 70–99)
Glucose-Capillary: 118 mg/dL — ABNORMAL HIGH (ref 70–99)
Glucose-Capillary: 109 mg/dL — ABNORMAL HIGH (ref 70–99)
Glucose-Capillary: 102 mg/dL — ABNORMAL HIGH (ref 70–99)

## 2010-12-15 LAB — CBC
HCT: 37.8 % — ABNORMAL LOW (ref 39.0–52.0)
HCT: 42.5 % (ref 39.0–52.0)
HCT: 43.4 % (ref 39.0–52.0)
HCT: 47.6 % (ref 39.0–52.0)
Hemoglobin: 12.4 g/dL — ABNORMAL LOW (ref 13.0–17.0)
Hemoglobin: 14.7 g/dL (ref 13.0–17.0)
MCH: 31.6 pg (ref 26.0–34.0)
MCHC: 32.8 g/dL (ref 30.0–36.0)
MCHC: 33.6 g/dL (ref 30.0–36.0)
MCHC: 33.8 g/dL (ref 30.0–36.0)
MCV: 96.4 fL (ref 78.0–100.0)
MCV: 95.9 fL (ref 78.0–100.0)
MCV: 96.3 fL (ref 78.0–100.0)
MCV: 96.8 fL (ref 78.0–100.0)
Platelets: 169 10*3/uL (ref 150–400)
Platelets: 183 10*3/uL (ref 150–400)
Platelets: 213 10*3/uL (ref 150–400)
RBC: 3.92 MIL/uL — ABNORMAL LOW (ref 4.22–5.81)
RBC: 4.41 MIL/uL (ref 4.22–5.81)
RBC: 4.42 MIL/uL (ref 4.22–5.81)
RBC: 4.52 MIL/uL (ref 4.22–5.81)
RDW: 13.5 % (ref 11.5–15.5)
RDW: 13.3 % (ref 11.5–15.5)
RDW: 13.8 % (ref 11.5–15.5)
WBC: 12.5 10*3/uL — ABNORMAL HIGH (ref 4.0–10.5)
WBC: 11.6 10*3/uL — ABNORMAL HIGH (ref 4.0–10.5)
WBC: 8.8 10*3/uL (ref 4.0–10.5)

## 2010-12-15 LAB — COMPREHENSIVE METABOLIC PANEL
ALT: 20 U/L (ref 0–53)
ALT: 20 U/L (ref 0–53)
AST: 21 U/L (ref 0–37)
AST: 31 U/L (ref 0–37)
Albumin: 2.9 g/dL — ABNORMAL LOW (ref 3.5–5.2)
Albumin: 3.5 g/dL (ref 3.5–5.2)
Alkaline Phosphatase: 45 U/L (ref 39–117)
BUN: 21 mg/dL (ref 6–23)
BUN: 26 mg/dL — ABNORMAL HIGH (ref 6–23)
BUN: 34 mg/dL — ABNORMAL HIGH (ref 6–23)
CO2: 27 mEq/L (ref 19–32)
CO2: 27 mEq/L (ref 19–32)
CO2: 28 mEq/L (ref 19–32)
Calcium: 8.3 mg/dL — ABNORMAL LOW (ref 8.4–10.5)
Calcium: 8.6 mg/dL (ref 8.4–10.5)
Chloride: 103 mEq/L (ref 96–112)
Chloride: 106 mEq/L (ref 96–112)
Chloride: 108 mEq/L (ref 96–112)
Creatinine, Ser: 1.27 mg/dL (ref 0.4–1.5)
Creatinine, Ser: 1.3 mg/dL (ref 0.4–1.5)
Creatinine, Ser: 1.42 mg/dL (ref 0.4–1.5)
GFR calc Af Amer: 59 mL/min — ABNORMAL LOW (ref >60)
GFR calc Af Amer: >60 mL/min (ref >60)
GFR calc non Af Amer: 48 mL/min — ABNORMAL LOW (ref >60)
GFR calc non Af Amer: 53 mL/min — ABNORMAL LOW (ref >60)
GFR calc non Af Amer: 55 mL/min — ABNORMAL LOW (ref >60)
Glucose, Bld: 94 mg/dL (ref 70–99)
Potassium: 4.1 mEq/L (ref 3.5–5.1)
Sodium: 138 mEq/L (ref 135–145)
Sodium: 143 mEq/L (ref 135–145)
Total Bilirubin: 0.6 mg/dL (ref 0.3–1.2)
Total Bilirubin: 0.7 mg/dL (ref 0.3–1.2)
Total Bilirubin: 1.4 mg/dL — ABNORMAL HIGH (ref 0.3–1.2)
Total Protein: 6.3 g/dL (ref 6.0–8.3)
Total Protein: 7.1 g/dL (ref 6.0–8.3)

## 2010-12-15 LAB — CULTURE, BLOOD (ROUTINE X 2): Culture: NO GROWTH

## 2010-12-15 LAB — DIFFERENTIAL
Basophils Absolute: 0.1 10*3/uL (ref 0.0–0.1)
Lymphocytes Relative: 13 % (ref 12–46)
Monocytes Absolute: 1 10*3/uL (ref 0.1–1.0)
Monocytes Relative: 9 % (ref 3–12)
Neutro Abs: 8.6 10*3/uL — ABNORMAL HIGH (ref 1.7–7.7)
Neutrophils Relative %: 77 % (ref 43–77)

## 2010-12-15 LAB — GLUCOSE, CAPILLARY
Glucose-Capillary: 104 mg/dL — ABNORMAL HIGH (ref 70–99)
Glucose-Capillary: 124 mg/dL — ABNORMAL HIGH (ref 70–99)

## 2010-12-15 NOTE — Consult Note (Signed)
NAME:  Eric Gordon, Eric Gordon           ACCOUNT NO.:  [id number]  MEDICAL RECORD NO.:  [id number]           PATIENT TYPE:  O  LOCATION:  6526                         FACILITY:  [facility]  PHYSICIAN:  Kalman Shan, MD   DATE OF BIRTH:  [birth year]  DATE OF CONSULTATION:  12/06/2010 DATE OF DISCHARGE:  12/06/2010                                CONSULTATION   REQUESTED BY:  Dr. Sheliah Plane, Thoracic Surgery.  PULMONARY MEDICINE:  Coralyn Helling, MD.  PRIMARY CARE PHYSICIAN:  Gwen Pounds, MD.  PRIMARY CARDIOLOGIST:  Jesse Sans. Wall, MD, [facility].  REASON FOR CONSULTATION: 1. Dyspnea evaluation. 2. Preop pulmonary evaluation prior to bypass.  HISTORY OF PRESENT ILLNESS:  History is obtained from the patient, his wife, and review of the charts.  [name] is a pleasant 77 year old, overweight gentleman, with a body mass index of 30, who has dyspnea on exertion, ongoing since March 2011.  He and his wife state that prior to March 2011, he was fine and he was at baseline health; however, he was admitted for left lower lobe pneumonia with hypoxemia, chills, and CT scan of the chest showed bilateral lower lobe airspace disease, left greater than right, and he was treated with community-acquired pneumonia antibiotics and subsequently discharged.  However, since discharge, he has had dyspnea on exertion that is new.  Of particular note, prior to the development of pneumonia, he would have on and off precordial chest pain with radiation to the left arm, but this was not exertional in nature and would occur randomly and get relieved spontaneously, but subsequent to that admission and discharge for pneumonia, he started developing dyspnea on exertion.  This is insidious in onset, gradually progressive, always occurs on exertion, but always relieved by rest.  There is variability to the intensity of exertion that brings on dyspnea. Typically, taking a shower and changing his clothes will  make him very dyspneic and leave him exhausted along with a sense of chest tightness. Occasionally, he has noticed that even mild amount of exertion like walking across the room would make him very dyspneic and tired and would force him to take rest.  This has happened more so in the last several weeks.  Currently, dyspnea is not associated with exertional chest pain, but he has had a total of three episodes of atypical precordial chest pain, radiating to the arm, occurring at rest, and relieved spontaneously since March 2011.  He denies any palpitations, wheezing, cough, runny nose, hemoptysis, edema, paroxysmal nocturnal dyspnea.  Workup so far has included evaluation in Feb 07, 2010 with a resting 2-D echocardiogram that showed a left ventricular mild concentric hypertrophy with an EF of 60%-65% and a grade 1 diastolic dysfunction, but otherwise normal.  No comment on pulmonary artery pressures.  On Feb 08, 2010, pulmonary function test that showed restrictive chest disease, but with significant bronchodilator response and reduced diffusion capacity of 67%.  He has had a trial of Symbicort and Combivent after that which has not helped.  CT angiogram on February 28, 2010, ruled out, showed clearance of the pneumonia, but with residual bilateral atelectasis and had ruled out pulmonary  embolism.  Stress echocardiogram, May 06, 2010, showed hypokinesis of the inferoseptal myocardium after he reached target heart rate.  This stress test did not result in any chest pain or EKG change, but he does admit that he was very dyspneic during the time of this stress test.  He subsequently got referred to Dr. Juanito Doom in Cardiology.  He has had cardiac catheterizations, most recently December 05, 2010.  Yesterday, which showed three-vessel coronary artery disease including complex LAD, bifurcation of disease, that Dr. Shawnie Pons did not think amenable potentially to stent placement.  Therefore,  bypass surgery has been planned.  Pulmonary Critical Care has been consulted now to assess for potential other pulmonary etiologies for dyspnea and also especially because his hemoglobin is running between 15.5 and 16.5, and his prior pO2 is 68, both in February 2012 and March 2012.  They also want pulmonary preop evaluation prior to bypass.  Of note, he is an ex-smoker, having smoked one pack a day for 30 years, having quit 26 years ago.  PAST MEDICAL HISTORY: 1. Dyspnea evaluation as mentioned in the history of present illness. 2. Obesity with body mass index of 30. 3. Severe obstructive sleep apnea, on CPAP. 4. Grade 1 diastolic dysfunction, echo on Feb 08, 2010. 5. Hyperlipidemia. 6. Gastroesophageal reflux disease with Barrett esophagus. 7. Colonic polyps. 8. Mild diverticulosis. 9. Degenerative low back osteoarthritis. 10.Benign prostatic hypertrophy with urinary obstruction. 11.Elevated PSA with negative biopsy. 12.Erectile dysfunction with penile pump. 13.Overactive bladder. 14.Nephrolithiasis. 15.Left carotid bruit secondary to ECA stenosis. 16.Osteoarthritis. 17.Plantar fasciitis. 18.Bilateral internal carotid artery stenosis 1-40% in 2005 and 2009     without any changes. 19.Benign essential tremor.  FAMILY HISTORY:  Penile implant, rotator cuff surgery, right Achilles heel operation, ankle surgery, inguinal hernia, history of knee surgery.  FAMILY HISTORY:  Father died at 29 of AAA.  Mother died at age 73 with coronary artery disease.  Siblings do have diabetes, coronary artery disease, CABG, peripheral vascular disease, and hypertension.  SOCIAL HISTORY:  Married to [place], has 2 sons.  Works in [employer].  Quit smoking in 1985, started at age 30, smoked 1.5 pack per day.  REVIEW OF SYSTEMS:  As per history of present illness and in the past history.  PHYSICAL EXAMINATION:  VITAL SIGNS:  Temperature 97.9, pulse of 82, respiratory rate of 25, blood pressure is  elevated at 167/86, saturation 97% on room air. GENERAL:  Tall, overweight male, looks comfortable in bed, walks up and down the hallway. HEAD:  Normocephalic, atraumatic. EYES:  Pupils are equal and reactive to light.  Conjunctivae and lids normal. NECK:  Supple.  No JVD.  No mass.  No thyromegaly.  No abnormal cervical nodes. CHEST WALL:  No deformities of breast mass. LUNGS:  Clear to by auscultation bilaterally.  No wheeze. HEART:  Regular rate and rhythm.  No murmurs.  No gallops.  Carotid upstroke normal.  No abdominal bruits.  Femoral pulses well felt.  Pedal pulses well felt. MUSCULOSKELETAL:  Normal back.  Normal gait.  Muscle strength and tone normal. EXTREMITIES:  No cyanosis, no clubbing, no pedal edema. NEUROLOGIC:  Alert and oriented x3.  Speech is normal.  Gait is normal. SKIN:  Intact without lesions or rashes. PSYCH:  Normal affect.  CURRENT MEDICATIONS:  Include, adenosine, ascorbic acid, aspirin, Symbicort, vitamin D3, hydrochlorothiazide, metoprolol, omega-3 fatty acid, pantoprazole, Zocor, acetaminophen, Combivent, Robaxin, and Ambien.  LAB VALUES: 1. Hemoglobin of 16.5 g on March 16. 2. Arterial blood gas, March 16,  shows pH 7.44, pCO2 39, and pO2 of     67, and pO2 was similar in February as well.CHEST X-RAY:  March 16, no acute cardiopulmonary abnormalities.  OTHER LABS:  Reviewed in history of present illness.  On his walking desaturation test, he walked 500 feet in front of me.  He did not get dyspneic.  His resting pulse ox was 92% on room air, but he improved to 98% with walking and did not desaturate.  IMPRESSION AND PLAN: 1. Dyspnea on exertion.  This is highly likely secondary to angina     equivalent.  Contributing could be diastolic dysfunction secondary     to hypertension.  Currently, pulmonary embolism, interstitial     pulmonary fibrosis, and even mild-to-moderate chronic obstructive     pulmonary disease have been ruled out with various  investigations.     The story is pretty consistent with angina equivalent, particularly     he became dyspneic in August 2011 when he had a stress echo, and at     that time, the significant finding was inferior wall hypokinesis on     the stress echo.  In any event, other potential cause for dyspnea     could be pulmonary hypertension secondary to sleep apnea, but this     is not mentioned on any echocardiograms.  In any event, I believe     that the best way to sort out the other etiologies of dyspnea is to     fix his coronary artery disease and then subsequently undergo     cardiopulmonary rehab and then reassess if there are any potential     etiologies with cardiopulmonary stress test. 2. Abnormal pulmonary function tests.  This includes restrictive chest     defect, which could be explained by his obesity, but also lower     diffusion capacity with increased hemoglobin and a low pO2 of 68,     which suggests chronic hypoxemia.  This could reflect prior heavy     smoking and subtle emphysema on the CT chest, which is not fully     apparent.  Alternatively, this could be from mild pulmonary     hypertension.  In any event, I do not think this is significant     because he did not desaturate after walking 500 feet.  Again, this     has to be assessed in more detail after fixing his coronary artery     disease. 3. Preop pulmonary evaluation.  I think he is only at standard mild     pulmonary complication after bypass surgery.  I have explained to     him about diaphragmatic paralysis, which is rare, less than 1% and     also onset of pleural effusions, and also ventilator dependence and     pneumonia.  He does not run any excess risk for complications.  I     would recommend that he continues bronchodilators between the post     CABG.  DVT prophylaxis with Lovenox and also continued use of his     auto CPAP post extubation.  Pulmonary and critical care will be happy to consult in the  postop. Please let us know if there is any further questions.     Kalman Shan, MD     MR/MEDQ  D:  12/06/2010  T:  12/07/2010  Job:  [id number]  cc:   Coralyn Helling, MD Gwen Pounds, MD Jesse Sans Daleen Squibb, MD, [facility]  Tyrone Sage, MD  Electronically Signed by Kalman Shan MD on 12/15/2010 08:49:42 PM

## 2010-12-18 DIAGNOSIS — R0602 Shortness of breath: Secondary | ICD-10-CM

## 2010-12-18 DIAGNOSIS — J438 Other emphysema: Secondary | ICD-10-CM

## 2010-12-18 DIAGNOSIS — J9620 Acute and chronic respiratory failure, unspecified whether with hypoxia or hypercapnia: Secondary | ICD-10-CM

## 2010-12-19 LAB — DIFFERENTIAL
Basophils Absolute: 0 10*3/uL (ref 0.0–0.1)
Basophils Relative: 0 % (ref 0–1)
Eosinophils Absolute: 0.5 10*3/uL (ref 0.0–0.7)
Eosinophils Relative: 5 % (ref 0–5)
Lymphocytes Relative: 14 % (ref 12–46)
Lymphs Abs: 1.6 10*3/uL (ref 0.7–4.0)
Monocytes Absolute: 1.3 10*3/uL — ABNORMAL HIGH (ref 0.1–1.0)
Monocytes Relative: 12 % (ref 3–12)
Neutro Abs: 7.6 10*3/uL (ref 1.7–7.7)
Neutrophils Relative %: 69 % (ref 43–77)

## 2010-12-19 LAB — BASIC METABOLIC PANEL
BUN: 18 mg/dL (ref 6–23)
CO2: 29 mEq/L (ref 19–32)
Calcium: 8.8 mg/dL (ref 8.4–10.5)
Chloride: 97 mEq/L (ref 96–112)
Creatinine, Ser: 1.28 mg/dL (ref 0.4–1.5)
GFR calc Af Amer: >60 mL/min (ref >60)
GFR calc non Af Amer: 55 mL/min — ABNORMAL LOW (ref >60)
Glucose, Bld: 97 mg/dL (ref 70–99)
Potassium: 4 mEq/L (ref 3.5–5.1)
Sodium: 137 mEq/L (ref 135–145)

## 2010-12-19 LAB — CBC
HCT: 33.9 % — ABNORMAL LOW (ref 39.0–52.0)
Hemoglobin: 10.9 g/dL — ABNORMAL LOW (ref 13.0–17.0)
MCH: 30.6 pg (ref 26.0–34.0)
MCHC: 32.2 g/dL (ref 30.0–36.0)
MCV: 95.2 fL (ref 78.0–100.0)
Platelets: 303 10*3/uL (ref 150–400)
RBC: 3.56 MIL/uL — ABNORMAL LOW (ref 4.22–5.81)
RDW: 13.3 % (ref 11.5–15.5)
WBC: 11 10*3/uL — ABNORMAL HIGH (ref 4.0–10.5)

## 2010-12-19 LAB — MAGNESIUM: Magnesium: 1.9 mg/dL (ref 1.5–2.5)

## 2010-12-19 NOTE — Discharge Summary (Signed)
NAME:  Eric Gordon, Eric Gordon NO.:  [id number]  MEDICAL RECORD NO.:  [id number]           PATIENT TYPE:  I  LOCATION:  2015                         FACILITY:  [facility]  PHYSICIAN:  Sheliah Plane, MD    DATE OF BIRTH:  1931-01-22  DATE OF ADMISSION:  12/11/2010 DATE OF DISCHARGE:                              DISCHARGE SUMMARY   ADMITTING DIAGNOSES: 1. Multivessel coronary artery disease. 2. History of tobacco abuse. 3. History of hypertension. 4. History of cerebral vascular disease (asymptomatic left carotid     artery stenosis 60-80%). 5. History of hyperlipidemia. 6. History of gastroesophageal reflux disease (Barrett esophagus). 7. History of obstructive sleep apnea (utilizing continuous positive     airway pressure). 8. History of benign prostatic hypertrophy.  DISCHARGE DIAGNOSES: 1. Multivessel coronary artery disease. 2. History of tobacco abuse. 3. History of hypertension. 4. History of cerebral vascular disease (asymptomatic left carotid     artery stenosis 60-80%). 5. History of hyperlipidemia. 6. History of gastroesophageal reflux disease (Barrett esophagus). 7. History of obstructive sleep apnea (utilizing continuous positive     airway pressure). 8. History of benign prostatic hypertrophy. 9. Postoperative atrial fibrillation (conversion to sinus rhythm). 10.Acute blood loss anemia.  PROCEDURES: 1. Cardiac catheterization performed by Dr. Riley Kill on December 05, 2010. 2. Coronary artery bypass grafting x5 (left internal mammary artery to     left anterior descending, saphenous vein graft to diagonal,     saphenous vein graft sequentially to first obtuse marginal artery     and second obtuse marginal artery, saphenous vein graft to right     coronary artery with endoscopic vein harvesting of bilateral lower     extremities by Dr. Tyrone Sage on December 11, 2010. 3. Intraoperative transesophageal echocardiography performed by Dr.     Noreene Larsson on December 11, 2010.  HISTORY OF PRESENT ILLNESS:  This is a 77 year old male with the aforementioned past medical history who states that approximately 1 year ago he was treated for pneumonia, but has continued to have increasing episodes of shortness of breath (usually associated with exertion).  He has also had some exertional-related substernal chest pain radiating to the arm, but not necessarily to complaints of shortness of breath.  He has had an extensive pulmonary workup by [facility].  He also underwent a stress test that showed inferior ischemia and grade 1 diastolic dysfunction with normal systolic ejection fraction.  The patient states in spite of intensive medical therapy as well as no evidence of valvular disease he continued to have symptoms.  He underwent a cardiac catheterization by Dr. Eden Emms on November 08, 2010. He presented to [facility] on December 11, 2010, to undergo a repeat cardiac catheterization by Dr. Riley Kill.  Results indicated that the patient had severe three-vessel coronary artery disease.  A cardiothoracic consultation was obtained with Dr. Tyrone Sage for consideration of coronary artery bypass grafting surgery.  Preoperative cardiovascular ultrasound revealed a 60-80% left internal carotid artery stenosis as well as a high-grade left external carotid artery stenosis. The right internal carotid had no significant stenosis.  Potential risks, complications, and benefits of the surgery were discussed with the patient.  He agreed to proceed with surgeon and underwent CABG x5 on December 11, 2010.  BRIEF HOSPITAL COURSE STAY:  The patient was extubated without difficulty early the evening of surgery.  He remained afebrile and hemodynamically stable.  He initially was AI paced at 80.  Swan-Ganz, A- line, chest tubes, and Foley were all removed early in his postoperative course.  His heart rate then was in the 70s.  He was started on a low- dose beta-blocker which was  titrated accordingly.  He was found to have mild acute blood loss anemia.  His H and H went as low as 12.4 and 37.8. He did not require postoperative transfusion.  He was felt surgically stable for transfer from the Intensive Care Unit to [facility] for further convalescence on December 13, 2010.  He continued to progress with cardiac rehab.  He then went to atrial fibrillation with RVR.  He was initially given Lopressor IV.  He was then started on an amiodarone drip.  His beta-blocker was increased and he did convert to sinus rhythm.  The patient continued to improve such that currently on postop day #5 he is already tolerating a diet.  He has had a bowel movement.  He had a T-max of 99.3 today, but later became afebrile.  Heart has been in the 70s- 80s, BP 122/50 on tele.  He has mostly been in sinus rhythm with occasional run of SVT.  His O2 sat is 99% on 1 liter of nasal cannula.  PHYSICAL EXAMINATION:  CARDIOVASCULAR:  Regular rate and rhythm. LUNGS:  Diminished left base. ABDOMEN:  Benign. EXTREMITIES:  Mild lower extremity edema.  Incisions are clean, dry, and continuing to heal.  Provided the patient remains afebrile, in sinus rhythm, and is weaned off O2, pending morning round evaluation, he will be surgically stable for discharge on November 18, 2010.  Prior to the patient's discharge, however, epicardial pacing wire and chest tube sutures will be removed.  LATEST LABORATORY STUDIES:  CBC done on December 15, 2010, H and H 12.4 and 37.8, white count down to 12,500, and platelet count 169,000.  BMET done on December 14, 2010, potassium 3.9, sodium 135, BUN and creatinine 20 and 1.0 respectively.  Last chest x-ray done on December 14, 2010, showed postsurgical changes of CABG, persistent bibasilar atelectasis, and small bilateral pleural effusions . no pneumothorax.  DISCHARGE INSTRUCTIONS:  Diet:  The patient should remain on low sodium, heart healthy.  Activity:  The patient may walk up  steps.  He may shower, not to lift more than 10 pounds for 4 weeks.  He is not to drive until after 4 weeks.  He is to continue with his breathing exercises daily.  He is to walk every day and increase his frequency and duration as tolerates.  Wound care:  He is to use soap and water on his wounds.  He is to contact the office if any wound problems arise.  Follow Appointments: 1. The patient is to contact Dr. Vern Claude office for followup     appointment in 2 weeks. 2. The patient has an appointment with Dr. Tyrone Sage on January 02, 2011,     at 2 p.m.  45 minutes prior to this office appointment, a chest x-     ray will be obtained.  Discharge medications at time of dictation include the following: 1. Amiodarone 400 mg p.o. 2 times daily for 10 days and then 200 mg     p.o. 2 times daily thereafter. 2.  Mucinex 600 mg p.o. q.12 h. p.r.n. cough. 3. Crestor 10 mg p.o. at bedtime. 4. Ultram 50 mg 1 tablet p.o. q.4-6 h. p.r.n. cough. 5. Enteric-coated aspirin 325 mg p.o. daily. 6. Combivent inhaler 1-2 puffs inhaled every 4 hours as needed for     shortness of breath. 7. Flomax 0.4 mg p.o. daily. 8. Lopressor 25 mg p.o. 2 times daily. 9. Omeprazole 20 mg p.o. daily. 10.Symbicort 160/4.5 mcg 2 puffs inhaled 2 times daily. 11.Vitamin C 500 mg p.o. daily. 12.Vitamin D3 1000 units p.o. daily.  Please note the patient was not placed on an ACE inhibitor prior to discharge secondary to blood pressure being well controlled with a beta- blocker.  This may be started as an outpatient.     Doree Fudge, PA   ______________________________ Sheliah Plane, MD    DZ/MEDQ  D:  12/16/2010  T:  12/17/2010  Job:  [id number]  cc:   Thomas C. Daleen Squibb, MD, [facility] Gwen Pounds, MD  Electronically Signed by Doree Fudge PA on 12/17/2010 11:45:05 AM Electronically Signed by Sheliah Plane MD on 12/19/2010 03:43:43 PM

## 2010-12-19 NOTE — Op Note (Signed)
NAME:  Eric Gordon, Eric Gordon NO.:  [id number]  MEDICAL RECORD NO.:  [id number]           PATIENT TYPE:  I  LOCATION:  2015                         FACILITY:  [facility]  PHYSICIAN:  Sheliah Plane, MD    DATE OF BIRTH:  [birth year]  DATE OF PROCEDURE:  12/11/2010 DATE OF DISCHARGE:                              OPERATIVE REPORT   PREOPERATIVE DIAGNOSIS:  Coronary occlusive disease with progressive angina.  POSTOPERATIVE DIAGNOSIS:  Coronary occlusive disease with progressive angina.  SURGICAL PROCEDURES:  Coronary artery bypass grafting x5 with a left internal mammary to the left anterior descending coronary artery, reverse saphenous vein graft to the diagonal coronary artery, sequential reverse saphenous vein graft to the obtuse marginal and distal circumflex and reverse saphenous vein graft to the distal right coronary artery with endoscopic vein harvesting.  SURGEON:  Sheliah Plane, MD  FIRST ASSISTANT:  Salvatore Decent. Dorris Fetch, MD  SECOND ASSISTANT:  Rowe Clack, PA-C  BRIEF HISTORY:  The patient is a 77 year old male who was cathed in February and again in March because of positive stress test with inferior ischemia done by a stress echo and progressive shortness of breath with exertion symptoms were continuing to worsen.  Evaluation by Pulmonology including PFTs and CT scan of the chest to rule out pulmonary embolus were unrevealing.  It was felt that the patient's shortness of breath symptoms and findings at catheterization suggested that symptoms were of ischemic origin.  On initial catheterization, the patient was felt to have a significant right coronary lesion and was referred back to Dr. Riley Kill for angioplasty of the right coronary artery.  On repeat cardiac catheterization, the patient did have mid right coronary lesion but also had significant LAD, diagonal disease and circumflex disease.  Coronary artery bypass grafting was recommended to the  patient.  On the patient's preoperative laboratory findings, his hematocrit was noted to be elevated at 49 and hemoglobin at 16 even after catheterization.  Erythropoietin level preoperatively was within normal range.  Formal echocardiogram did not reveal any evidence of intracardiac shunt.  The risks and options were discussed with the patient and his wife in detail.  He was willing to proceed.  DESCRIPTION OF PROCEDURE:  With Swan-Ganz and arterial line monitors in place, the patient underwent general endotracheal anesthesia without incident.  TEE was performed by Dr. Noreene Larsson and again showed no evidence of intracardiac shunts and overall preserved LV function.  Skin of the chest and legs were prepped with Betadine and draped in usual sterile manner.  Using the [employer] endovein harvesting system, vein was harvested from the right thigh and upper calf.  Two good segments of vein were obtained.  In this way though additional vein was then harvested endoscopically from the left thigh.  Median sternotomy was performed.  The left internal mammary artery was dissected down as pedicle graft.  The distal artery was bad, had a good free flow. Pericardium was opened.  Overall ventricular function appeared preserved.  The patient was systemically heparinized.  Ascending aorta was cannulated.  The right atrium was cannulated.  Aortic root vent cardioplegia needle was introduced into the ascending aorta.  The  patient was placed on cardiopulmonary bypass 2.4 liters per minute per meter square.  Sites of anastomosis were selected and dissected out of epicardium.  The patient's body temperature was cooled to 30 degrees. Aortic crossclamp was applied and 500 mL of cold blood potassium cardioplegia was administered with rapid diastolic arrest of the heart. Myocardial septal temperatures were monitored throughout crossclamp. Attention was turned first to the distal right coronary artery which was opened  with a 1.5-mm probe and using a running 7-0 Prolene distal anastomosis was performed.  The heart was then elevated and the obtuse marginal vessel was opened and admitted a 1.5-mm probe using diamond type side-to-side anastomosis was carried out.  Distal extent of the same vein was then carried to the distal circumflex vessel which was opened slight and also admitted a 1.5-mm probe, using running 7-0 Prolene distal anastomosis was performed.  Attention was then turned to the diagonal coronary artery which was relatively small 1.2-1.3 mm in size, using a running 7-0 Prolene distal anastomosis was performed. Attention was then turned to the LAD and the very distal vessel was then opened because the proximal portion of the vessel was intramyocardial. Using a running 8-0 Prolene, the left internal mammary artery was anastomosed to left anterior descending coronary artery.  With release of the bulldog on the mammary artery there was appropriate rise in myocardial septal temperature.  The bulldog was placed back on the mammary artery with crossclamp still in place.  Three punch aortotomies were performed.  Each of the three vein grafts were anastomosed to the ascending aorta.  Air was evacuated from the grafts.  The bulldog on the mammary artery removed with prompt rise in myocardial septal temperature.  Aortic crossclamp was removed with total crossclamp time of 104 minutes.  The patient spontaneously converted to a sinus rhythm. Atrial and ventricular pacing wires were applied.  Graft markers were applied.  He was then ventilated and weaned from cardiopulmonary bypass without difficulty.  Total pump time was 140 minutes.  Sites of anastomosis were free of bleeding.  He was decannulated in usual fashion.  Protamine sulfate was administered.  A left pleural tube and a Blake mediastinal drain were left in place.  Pericardium was loosely reapproximated.  Sternum closed with #6 stainless steel wire.   Fascia closed with interrupted 0 Vicryl, running 3-0 Vicryl in subcutaneous tissue, 4-0 subcuticular stitch in skin edges.  Dry dressings were applied.  Sponge and needle count was reported as correct at the completion of the procedure.  The patient tolerated the procedure without obvious complication and was transferred to surgical intensive care unit for further postoperative care.  No blood blank blood products were administered during the procedure.     Sheliah Plane, MD     EG/MEDQ  D:  12/16/2010  T:  12/16/2010  Job:  [id number]  cc:   Arturo Morton. Riley Kill, MD, [facility] Jesse Sans. Daleen Squibb, MD, [facility]  Electronically Signed by Sheliah Plane MD on 12/19/2010 03:43:40 PM

## 2010-12-20 LAB — GLUCOSE, CAPILLARY: Glucose-Capillary: 96 mg/dL (ref 70–99)

## 2010-12-23 ENCOUNTER — Inpatient Hospital Stay (HOSPITAL_COMMUNITY)
Admission: AD | Admit: 2010-12-23 | Discharge: 2010-12-26 | DRG: 176 | Disposition: A | Discharge status: Home Health | Payer: Medicare Other | Source: Ambulatory Visit | Attending: Cardiothoracic Surgery | Admitting: Cardiothoracic Surgery

## 2010-12-23 ENCOUNTER — Inpatient Hospital Stay (HOSPITAL_COMMUNITY): Payer: Medicare Other

## 2010-12-23 ENCOUNTER — Encounter (HOSPITAL_COMMUNITY): Payer: Self-pay

## 2010-12-23 ENCOUNTER — Encounter (INDEPENDENT_AMBULATORY_CARE_PROVIDER_SITE_OTHER): Payer: BLUE CROSS/BLUE SHIELD | Admitting: Thoracic Surgery (Cardiothoracic Vascular Surgery)

## 2010-12-23 ENCOUNTER — Other Ambulatory Visit: Payer: Self-pay | Admitting: Thoracic Surgery (Cardiothoracic Vascular Surgery)

## 2010-12-23 ENCOUNTER — Ambulatory Visit
Admission: RE | Admit: 2010-12-23 | Discharge: 2010-12-23 | Disposition: A | Discharge status: Home / Self Care | Payer: Medicare Other | Source: Ambulatory Visit | Attending: Thoracic Surgery (Cardiothoracic Vascular Surgery) | Admitting: Thoracic Surgery (Cardiothoracic Vascular Surgery)

## 2010-12-23 DIAGNOSIS — E785 Hyperlipidemia, unspecified: Secondary | ICD-10-CM | POA: Diagnosis present

## 2010-12-23 DIAGNOSIS — I2510 Atherosclerotic heart disease of native coronary artery without angina pectoris: Secondary | ICD-10-CM

## 2010-12-23 DIAGNOSIS — R0602 Shortness of breath: Secondary | ICD-10-CM

## 2010-12-23 DIAGNOSIS — K219 Gastro-esophageal reflux disease without esophagitis: Secondary | ICD-10-CM | POA: Diagnosis present

## 2010-12-23 DIAGNOSIS — I2699 Other pulmonary embolism without acute cor pulmonale: Secondary | ICD-10-CM

## 2010-12-23 DIAGNOSIS — Z951 Presence of aortocoronary bypass graft: Secondary | ICD-10-CM

## 2010-12-23 DIAGNOSIS — Y832 Surgical operation with anastomosis, bypass or graft as the cause of abnormal reaction of the patient, or of later complication, without mention of misadventure at the time of the procedure: Secondary | ICD-10-CM | POA: Diagnosis present

## 2010-12-23 DIAGNOSIS — Z8673 Personal history of transient ischemic attack (TIA), and cerebral infarction without residual deficits: Secondary | ICD-10-CM

## 2010-12-23 DIAGNOSIS — G4733 Obstructive sleep apnea (adult) (pediatric): Secondary | ICD-10-CM | POA: Diagnosis present

## 2010-12-23 DIAGNOSIS — Z7901 Long term (current) use of anticoagulants: Secondary | ICD-10-CM

## 2010-12-23 DIAGNOSIS — N400 Enlarged prostate without lower urinary tract symptoms: Secondary | ICD-10-CM | POA: Diagnosis present

## 2010-12-23 DIAGNOSIS — J90 Pleural effusion, not elsewhere classified: Secondary | ICD-10-CM

## 2010-12-23 DIAGNOSIS — I6529 Occlusion and stenosis of unspecified carotid artery: Secondary | ICD-10-CM | POA: Diagnosis present

## 2010-12-23 DIAGNOSIS — J449 Chronic obstructive pulmonary disease, unspecified: Secondary | ICD-10-CM

## 2010-12-23 DIAGNOSIS — R0902 Hypoxemia: Secondary | ICD-10-CM

## 2010-12-23 DIAGNOSIS — I10 Essential (primary) hypertension: Secondary | ICD-10-CM | POA: Diagnosis present

## 2010-12-23 DIAGNOSIS — R079 Chest pain, unspecified: Secondary | ICD-10-CM

## 2010-12-23 DIAGNOSIS — J95822 Acute and chronic postprocedural respiratory failure: Secondary | ICD-10-CM

## 2010-12-23 DIAGNOSIS — K2270 Barrett's esophagus without dysplasia: Secondary | ICD-10-CM | POA: Diagnosis present

## 2010-12-23 HISTORY — DX: Gastro-esophageal reflux disease without esophagitis: K21.9

## 2010-12-23 HISTORY — DX: Atherosclerotic heart disease of native coronary artery without angina pectoris: I25.10

## 2010-12-23 HISTORY — DX: Presence of aortocoronary bypass graft: Z95.1

## 2010-12-23 HISTORY — DX: Essential (primary) hypertension: I10

## 2010-12-23 LAB — D-DIMER, QUANTITATIVE: D-Dimer, Quant: 3.94 ug/mL-FEU — ABNORMAL HIGH (ref 0.00–0.48)

## 2010-12-23 LAB — URINALYSIS, ROUTINE W REFLEX MICROSCOPIC
Bilirubin Urine: NEGATIVE
Glucose, UA: NEGATIVE mg/dL
Hgb urine dipstick: NEGATIVE
Ketones, ur: NEGATIVE mg/dL
Nitrite: NEGATIVE
Protein, ur: NEGATIVE mg/dL
Specific Gravity, Urine: 1.02 (ref 1.005–1.030)
Urobilinogen, UA: 0.2 mg/dL (ref 0.0–1.0)
pH: 6 (ref 5.0–8.0)

## 2010-12-23 LAB — CARDIAC PANEL(CRET KIN+CKTOT+MB+TROPI)
CK, MB: 1.7 ng/mL (ref 0.3–4.0)
Relative Index: INVALID (ref 0.0–2.5)
Total CK: 43 U/L (ref 7–232)
Troponin I: 0.08 ng/mL — ABNORMAL HIGH (ref 0.00–0.06)

## 2010-12-23 LAB — BASIC METABOLIC PANEL
BUN: 16 mg/dL (ref 6–23)
CO2: 27 mEq/L (ref 19–32)
Calcium: 8.8 mg/dL (ref 8.4–10.5)
Chloride: 99 mEq/L (ref 96–112)
Creatinine, Ser: 1.26 mg/dL (ref 0.4–1.5)
GFR calc Af Amer: >60 mL/min (ref >60)
GFR calc non Af Amer: 56 mL/min — ABNORMAL LOW (ref >60)
Glucose, Bld: 93 mg/dL (ref 70–99)
Potassium: 5.1 mEq/L (ref 3.5–5.1)
Sodium: 134 mEq/L — ABNORMAL LOW (ref 135–145)

## 2010-12-23 LAB — CBC
HCT: 36.2 % — ABNORMAL LOW (ref 39.0–52.0)
Hemoglobin: 11.9 g/dL — ABNORMAL LOW (ref 13.0–17.0)
MCH: 31 pg (ref 26.0–34.0)
MCHC: 32.9 g/dL (ref 30.0–36.0)
MCV: 94.3 fL (ref 78.0–100.0)
Platelets: 357 10*3/uL (ref 150–400)
RBC: 3.84 MIL/uL — ABNORMAL LOW (ref 4.22–5.81)
RDW: 13.1 % (ref 11.5–15.5)
WBC: 13.3 10*3/uL — ABNORMAL HIGH (ref 4.0–10.5)

## 2010-12-23 LAB — URINE MICROSCOPIC-ADD ON

## 2010-12-23 MED ORDER — IOHEXOL 300 MG/ML  SOLN
100.0000 mL | Freq: Once | INTRAMUSCULAR | Status: AC | PRN
  Administered 2010-12-23: 100 mL via INTRAVENOUS

## 2010-12-23 NOTE — Discharge Summary (Signed)
NAME:  Eric Gordon, Eric Gordon NO.:  [id number]  MEDICAL RECORD NO.:  [id number]           PATIENT TYPE:  I  LOCATION:  2041                         FACILITY:  [facility]  PHYSICIAN:  Sheliah Plane, MD    DATE OF BIRTH:  06/23/1931  DATE OF ADMISSION:  12/11/2010 DATE OF DISCHARGE:  12/21/2010                              DISCHARGE SUMMARY   BRIEF HOSPITAL COURSE STAY:  Since last dictation, additional discharge diagnosis of cellulitis of the right upper extremity.  The patient remained afebrile.  He did go back into AFib with RVR for several minutes and then converted and maintained sinus rhythm.  He was initially placed on Ancef IV for a right upper extremity cellulitis (secondary to IV infiltration).  However, he developed blisters in the left antecubital fossa area.  As a result, this was stopped and he was placed on vancomycin.  Again, however, he developed several small blisters around the left antecubital area.  There were no hives or any other indications of an allergic reaction to the vancomycin.  This was discussed with Dr. Tyrone Sage.  He was given one more dose of IV vancomycin and was then placed on doxycycline 100 mg p.o. 2 times daily, which will be continued as an outpatient.  The patient is currently afebrile, heart rate in the 80s, BP 101/64, O2 sat 90-91% on room air.  He had decreasing erythema and edema of his right upper extremity.  His EVH site has superficially dehisced; however, Steri-Strips were placed the day before yesterday.  There is no erythema.  Remainder of physical exam unchanged as previously dictated. Provided the patient remains afebrile, hemodynamically stable, in sinus rhythm and pending morning round evaluation, he will be stable for discharge on December 21, 2010.  Latest laboratory studies are as follows:  CBC on December 19, 2010:  H and H 10.9 and 33.9 respectively.  White blood cell count down to 11,000, platelet count 33,000.  BMET  done on this date, potassium 4, sodium 137, BUN and creatinine 18 and 1.28 respectively.  Additional followup appointments include the patient needs to see Dr. Craige Cotta on January 14, 2011 at 10:30 a.m.  DISCHARGE MEDICATIONS:  At the time of dictation include: 1. Amiodarone 400 mg p.o. 2 times daily for 5 days, then amiodarone     200 mg p.o. 2 times daily thereafter. 2. Doxycycline 100 mg p.o. 2 times daily for 7 days. 3. Mucinex 600 mg p.o. q.12 h p.r.n. cough. 4. Crestor 10 mg p.o. daily. 5. Ultram 50 mg 1 tablet p.o. q.4-6 hours p.r.n. pain. 6. Enteric-coated aspirin 325 mg p.o. daily. 7. Combivent inhaler 1-2 puffs inhaled every 4 hours p.r.n. 8. Flomax 0.4 mg p.o. daily. 9. Lopressor 25 mg p.o. 2 times daily. 10.Omeprazole 20 mg p.o. daily. 11.Symbicort 160/4.5 mcg 2 puffs inhaled twice daily. 12.Vitamin C 500 mg p.o. daily. 13.Vitamin D3 1000 units p.o. daily. Patient was not placed on an ACE/ARB secondary to SBP being in the low 100's on a BB.    Doree Fudge, PA   ______________________________ Sheliah Plane, MD    DZ/MEDQ  D:  12/20/2010  T:  12/21/2010  Job:  [id number]  cc:   Coralyn Helling, MD Jesse Sans. Daleen Squibb, MD, [facility]  Electronically Signed by Doree Fudge PA on 12/23/2010 10:38:54 AM Electronically Signed by Sheliah Plane MD on 12/23/2010 04:15:12 PM

## 2010-12-24 DIAGNOSIS — R0602 Shortness of breath: Secondary | ICD-10-CM

## 2010-12-24 LAB — CBC
HCT: 39 % (ref 39.0–52.0)
Hemoglobin: 12.9 g/dL — ABNORMAL LOW (ref 13.0–17.0)
MCH: 31.2 pg (ref 26.0–34.0)
MCHC: 33.1 g/dL (ref 30.0–36.0)
MCV: 94.2 fL (ref 78.0–100.0)
Platelets: 391 10*3/uL (ref 150–400)
RBC: 4.14 MIL/uL — ABNORMAL LOW (ref 4.22–5.81)
RDW: 13 % (ref 11.5–15.5)
WBC: 13.1 10*3/uL — ABNORMAL HIGH (ref 4.0–10.5)

## 2010-12-24 LAB — PROTIME-INR
INR: 1.15 (ref 0.00–1.49)
Prothrombin Time: 14.9 seconds (ref 11.6–15.2)

## 2010-12-24 LAB — BASIC METABOLIC PANEL
BUN: 16 mg/dL (ref 6–23)
CO2: 26 mEq/L (ref 19–32)
Calcium: 9.4 mg/dL (ref 8.4–10.5)
Chloride: 99 mEq/L (ref 96–112)
Creatinine, Ser: 1.27 mg/dL (ref 0.4–1.5)
GFR calc Af Amer: >60 mL/min (ref >60)
GFR calc non Af Amer: 55 mL/min — ABNORMAL LOW (ref >60)
Glucose, Bld: 126 mg/dL — ABNORMAL HIGH (ref 70–99)
Potassium: 4.6 mEq/L (ref 3.5–5.1)
Sodium: 137 mEq/L (ref 135–145)

## 2010-12-24 LAB — HEPARIN LEVEL (UNFRACTIONATED)
Heparin Unfractionated: 0.6 IU/mL (ref 0.30–0.70)
Heparin Unfractionated: 0.62 IU/mL (ref 0.30–0.70)

## 2010-12-24 LAB — PHOSPHORUS: Phosphorus: 4.1 mg/dL (ref 2.3–4.6)

## 2010-12-24 LAB — BRAIN NATRIURETIC PEPTIDE: Pro B Natriuretic peptide (BNP): 251 pg/mL — ABNORMAL HIGH (ref 0.0–100.0)

## 2010-12-24 LAB — MAGNESIUM: Magnesium: 1.9 mg/dL (ref 1.5–2.5)

## 2010-12-25 DIAGNOSIS — R062 Wheezing: Secondary | ICD-10-CM

## 2010-12-25 DIAGNOSIS — I2699 Other pulmonary embolism without acute cor pulmonale: Secondary | ICD-10-CM

## 2010-12-25 LAB — PROTIME-INR
INR: 1.18 (ref 0.00–1.49)
Prothrombin Time: 15.2 seconds (ref 11.6–15.2)

## 2010-12-25 LAB — CBC
HCT: 35.4 % — ABNORMAL LOW (ref 39.0–52.0)
Hemoglobin: 11.4 g/dL — ABNORMAL LOW (ref 13.0–17.0)
MCH: 30.1 pg (ref 26.0–34.0)
MCHC: 32.2 g/dL (ref 30.0–36.0)
MCV: 93.4 fL (ref 78.0–100.0)
Platelets: 402 10*3/uL — ABNORMAL HIGH (ref 150–400)
RBC: 3.79 MIL/uL — ABNORMAL LOW (ref 4.22–5.81)
RDW: 13.1 % (ref 11.5–15.5)
WBC: 17.9 10*3/uL — ABNORMAL HIGH (ref 4.0–10.5)

## 2010-12-25 LAB — HEPARIN LEVEL (UNFRACTIONATED): Heparin Unfractionated: 0.48 IU/mL (ref 0.30–0.70)

## 2010-12-25 NOTE — H&P (Signed)
NAME:  Eric Gordon, Eric Gordon NO.:  [id number]  MEDICAL RECORD NO.:  [id number]           PATIENT TYPE:  I  LOCATION:  2030                         FACILITY:  [facility]  PHYSICIAN:  Sheliah Plane, MD    DATE OF BIRTH:  1931-08-28  DATE OF ADMISSION:  12/23/2010 DATE OF DISCHARGE:                             HISTORY & PHYSICAL   HISTORY OF PRESENT ILLNESS:  This is a 77 year old Caucasian male who is status post CABG x5 Dr. Tyrone Sage on December 11, 2010.  The patient was recently discharged from [facility] on December 21, 2010.  The patient states he had a fairly good day on Sunday.  He walked several times, although he did have some shortness of breath.  His shortness of breath gradually increased both at rest and worse with exertion through the evening.  He also began experiencing right sided rib pain. He initially presented to our office and was seen and evaluated by Dr. Tyrone Sage today.  Chest x- ray done showed small bilateral pleural effusions, greater on the left than on the right, improved atelectasis at the left base and no pneumothorax.   He does also state that his lower extremities are swollen  and he has an occasional dry cough with no sputum production. The patient denies any fever, chills, PND, orthopnea, or cough.   PAST MEDICAL HIS HISTORY:  Significant for the following: 1. Coronary artery disease. 2. Hypertension. 3. CVA (left ICA 68%, asymptomatic). 4. Hyperlipidemia. 5. OSA (uses CPAP at night). 6. GERD, Barrett esophagus. 7. BPH. 8. Postop AFib. 9. Acute blood loss anemia. 10.Erectile dysfunction. 11.Right upper extremity cellulitis (secondary to IV infiltration).  PAST SURGICAL HISTORY:  Significant for: 1. CABG x5 on December 11, 2010. 2. Penile implant. 3. Right rotator cuff surgery. 4. Right Achilles heel (June 2006). 5. Right knee surgery. 6. Left inguinal hernia repair (1997). 7. Right and left ankle surgeries.  ALLERGIES:   ANCEF.  MEDICATIONS: 1. Amiodarone 400 mg p.o. 2 times daily. 2. Doxycycline 100 mg p.o. b.i.d. 3. Mucinex 600 mg p.o. q.12 h. p.r.n. cough. 4. Crestor 10 mg p.o. at bedtime. 5. Enteric-coated aspirin 325 mg p.o. daily. 6. Ultram 50 mg 1 tablet every 4-6 hours p.o. p.r.n. pain. 7. Combivent inhaler 1-2 puffs q.4 h. P.r.n. 8. Flomax 0.4 mg p.o. daily. 9. Lopressor 25 mg p.o. daily. 10.Omeprazole 25 p.o. daily. 11.Symbicort 160/4.5 mcg 2 puffs inhaled b.i.d. 12.Vitamin C 500 mg p.o. daily. 13.Vitamin D3 1000 units p.o. daily.  REVIEW OF SYMPTOMS:  CONSTITUTIONAL:  The patient denies any fever, chills, or sweats.  Does still have some cough with some sputum production.  Denies any syncope or headache.  GI:  Denies any abdominal pain, nauseousness, vomiting, melena, or hematochezia.  Remaining review of symptoms is noncontributory except for those previously stated.  SOCIAL HISTORY:  The patient is married.  He has 2 children.  He used to smoke about 1-1/2 packs per day for many years, but he quit in 1985. Social alcohol use.  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  FAMILY HISTORY:  Mother is deceased at age 30, had a history of  coronary artery disease and hypertension.  Father is deceased at age 57, had an abdominal aneurysm.  Siblings, he has one brother alive, two are deceased, they had histories of hypertension, diabetes, coronary artery disease (CABG and PVD).  PHYSICAL EXAMINATION:  VITAL SIGNS:  Temperature 97.6, BP 130/79, heart rate 73, and O2 sat 91% on room air. HEENT:  Extraocular muscles are intact.  Pupils are equal, round, and reactive to light and accommodation.  Sclerae are anicteric. NECK:  Supple. PULMONARY:  Appears tachypneic.  His right lung is fairly clear.  His left lung is diminished to the left base, however, there were no rales, wheezes, or rhonchi. CARDIAC:  Regular rate and rhythm.  No murmurs, gallops, or rubs. ABDOMEN:  Soft and  nontender.  Bowel sounds present.  No rebound, no guarding. GU AND RECTAL:  Deferred. EXTREMITIES:  No cyanosis or clubbing.  There is slight pitting edema of the lower extremities, right greater than left. SKIN:  Sternal wound and right lower extremity wound are clean and dry and continuing to heal.  There is a chest tube wound that is dehisced and is not draining.  It is not erythematous. NEUROLOGIC:  Cranial II-XII are grossly intact without any focal deficits.  IMPRESSION AND PLAN: 1. Status post coronary artery bypass grafting  x5 on December 11, 2010. 2. Shortness of breath with tachypnea.  Plan:  We are going to obtain a CT scan of the chest to rule out pulmonary embolus as well as to further evaluate pleural effusions.  We will also obtain cardiac enzymes as well as a CBC, BMET, UA, as well as  a pulmonary consult.  We will continue to follow him closely.     Doree Fudge, PA   ______________________________ Sheliah Plane, MD    DZ/MEDQ  D:  12/23/2010  T:  12/24/2010  Job:  [id number]  Electronically Signed by Doree Fudge PA on 12/25/2010 09:52:07 AM Electronically Signed by Sheliah Plane MD on 12/25/2010 04:20:13 PM

## 2010-12-25 NOTE — Discharge Summary (Signed)
NAME:  Eric Gordon, Eric Gordon           ACCOUNT NO.:  [id number]  MEDICAL RECORD NO.:  [id number]           PATIENT TYPE:  O  LOCATION:  6526                         FACILITY:  [facility]  PHYSICIAN:  Wednesday Ericsson C. Lynia Landry, MD, FACCDATE OF BIRTH:  1931-01-15  DATE OF ADMISSION:  12/05/2010 DATE OF DISCHARGE:  12/06/2010                              DISCHARGE SUMMARY   PRIMARY CARDIOLOGIST:  Maisie Fus C. Cerra Eisenhower, MD, [facility].  PRIMARY CARE PROVIDER:  Gwen Pounds, MD.  DISCHARGE DIAGNOSES:  Unstable angina with multivessel coronary artery disease, pending coronary artery bypass grafting.  SECONDARY DIAGNOSES: 1. Hypertension. 2. Hyperlipidemia. 3. Hypertriglyceridemia. 4. Grade 1 diastolic dysfunction. 5. Gastroesophageal reflux disease with history of Barrett esophagus. 6. Severe obstructive sleep apnea, on CPAP. 7. Benign prostatic hyperplasia. 8. Erectile dysfunction, status post penile pump. 9. Carotid arterial disease with 60%-80% internal carotid artery     stenosis on the left and high-grade left external carotid artery     stenosis.  No significant right-sided stenosis. 10.History of nephrolithiasis. 11.Overactive bladder. 12.Diverticulosis. 13.History of colon polyps. 14.Benign essential tremor. 15.History of plantar fasciitis. 16.Status post inguinal hernia repair. 17.History of knee surgery. 18.A 3.3 x 3.2 infrarenal abdominal aortic aneurysm.  ALLERGIES:  No known drug allergies.  PROCEDURES: 1. Relook cardiac catheterization revealing complex proximal LAD     disease as well as significant obtuse marginal/circumflex disease     with recommendation for coronary artery bypass grafting. 2. On December 05, 2010, carotid ultrasound showing no evidence of right     internal carotid artery stenosis.  There was a 60%-80% left     internal carotid artery stenosis and high-grade left external     carotid artery stenosis.  Vertebral artery flow is antegrade     bilaterally.  HISTORY OF  PRESENT ILLNESS:  A 77 year old male with relatively recent history of progressive dyspnea and chest discomfort, prompting cardiac catheterization on February 17, which showed moderate multivessel disease, the worst of which was felt to be involving the right coronary artery.  The patient had normal LV function at that time.  Initially, medical therapy was recommended, however, the patient continued to have persistent dyspnea and was seen in the office by Dr. Daleen Squibb on February 28.  At that point, films were reviewed for consideration of PCI in the right coronary artery.  Ultimately, decision was made to pursue relook catheterization to better define his anatomy as film review showed complex LAD and diagonal disease as well.  HOSPITAL COURSE:  The patient presented to the cath lab on March 15, underwent diagnostic catheterization, revealing an 80% proximal stenosis in the LAD which was heavily calcified and involved in the first diagonal branch which itself had tandem 70% proximal stenosis.  The patient also had moderate obstructive disease within the large obtuse marginal and circumflex.  Considering complex multivessel disease, decision was made to pursue thoracic surgery evaluation.  The patient has been seen by Dr. Tyrone Sage with recommendation for coronary bypass grafting.  Preoperative cardiovascular ultrasound did reveal a 60%-80% left internal carotid artery stenosis as well as high-grade left external carotid artery stenosis.  The right carotids were within normal, but had  no hemodynamically significant stenoses.  The patient want to go 2-D echocardiogram this morning and also Pulmonology evaluation prior to discharge with tentative plan for discharge later this afternoon and follow up Surgery on December 11, 2010.  DISCHARGE LABS:  Hemoglobin 16.5, hematocrit 29.8, WBC 8.5, platelets 155, INR 0.99.  Sodium 140, potassium 3.9, chloride 106, CO2 29, BUN 17, creatinine 1.15, glucose  97, total bilirubin 0.8, alkaline phosphatase 58, AST 21, ALT 18, total protein 7.1, albumin 3.8, calcium 9.3. Urinalysis was negative.  DISPOSITION:  The patient will likely be discharged home this afternoon, pending additional medical evaluation.  FOLLOWUP PLANS AND APPOINTMENTS:  The patient is tentatively scheduled for coronary artery bypass grafting on December 11, 2010 with Dr. Tyrone Sage. Follow up with Dr. Daleen Squibb on April 20 at 11:30 a.m.  DISCHARGE MEDICATIONS: 1. Metoprolol 25 mg b.i.d. 2. Nitroglycerin 0.4 mg sublingual p.r.n. chest pain. 3. Aspirin 81 mg daily. 4. Combivent inhaler 1-2 puffs q.4 h. p.r.n. 5. Flomax 0.4 mg daily. 6. Hydrocodone/APAP 5/500 mg q.6 h. p.r.n. 7. HCTZ 25 mg daily. 8. Methocarbamol 500 mg q.6 h. p.r.n. 9. Omeprazole 20 mg daily. 10.Symbicort 160/4.5 mcg 2 puffs b.i.d. 11.Simvastatin 40 mg nightly. 12.Vitamin C 500 mg daily. 13.Vitamin D3 1000 units daily.  At the recommendation of Thoracic Surgery, the patient's Celebrex, losartan, and fish oil are currently on hold.  OUTSTANDING LAB STUDIES:  Follow up echocardiogram today.  DURATION OF DISCHARGE ENCOUNTER:  60 minutes including physician time.     Nicolasa Ducking, ANP   ______________________________ Jesse Sans. Daleen Squibb, MD, [facility]    CB/MEDQ  D:  12/06/2010  T:  12/07/2010  Job:  [id number]  Electronically Signed by Nicolasa Ducking ANP on 12/09/2010 12:06:49 PM Electronically Signed by Valera Castle MD FACC on 12/25/2010 01:00:32 PM

## 2010-12-26 ENCOUNTER — Inpatient Hospital Stay (HOSPITAL_COMMUNITY): Payer: Medicare Other

## 2010-12-26 DIAGNOSIS — I359 Nonrheumatic aortic valve disorder, unspecified: Secondary | ICD-10-CM

## 2010-12-26 LAB — CBC
HCT: 36.3 % — ABNORMAL LOW (ref 39.0–52.0)
Hemoglobin: 11.7 g/dL — ABNORMAL LOW (ref 13.0–17.0)
MCH: 30.3 pg (ref 26.0–34.0)
MCHC: 32.2 g/dL (ref 30.0–36.0)
MCV: 94 fL (ref 78.0–100.0)
Platelets: 409 10*3/uL — ABNORMAL HIGH (ref 150–400)
RBC: 3.86 MIL/uL — ABNORMAL LOW (ref 4.22–5.81)
RDW: 13.2 % (ref 11.5–15.5)
WBC: 14.3 10*3/uL — ABNORMAL HIGH (ref 4.0–10.5)

## 2010-12-26 LAB — PROTIME-INR
INR: 1.46 (ref 0.00–1.49)
Prothrombin Time: 17.9 seconds — ABNORMAL HIGH (ref 11.6–15.2)

## 2010-12-27 ENCOUNTER — Telehealth: Payer: Self-pay | Admitting: Physician Assistant

## 2010-12-27 NOTE — Telephone Encounter (Signed)
Home health nurse called wanting to know how many more doses of Lovenox need to be given. Reviewed patient's hospital records and [facility] from his admission. He received Heparin/Coumadin crossover on 4/3, 4/4, and on then Lovenox on day of discharge 4/5. Home Health RN gave him his dose of Lovenox today already (he is on q-day dosing). Per guidelines of 5-days with overlap, needs 1 more day of Lovenox inejction, which will be given by the pt's son tomorrow. The IP pharmacy here at [facility] concurred. His INR also increased from 1.45 to 2.5 today on 7.5mg  tablets. He was only only given tablets of this increment. Pharmacy recommended for the pt to back down to 5mg  po daily until his next INR check on Monday. I called in a prescription to [facility] for these tablets, and also called the patient's wife to update her with the instructions. She verbalized understanding and wrote instructions down. Home Health Nurse also updated of plan and will check his INR on Monday and forward results to our coumadin clinic.

## 2010-12-30 ENCOUNTER — Ambulatory Visit: Payer: Self-pay | Admitting: Cardiology

## 2010-12-30 DIAGNOSIS — I2699 Other pulmonary embolism without acute cor pulmonale: Secondary | ICD-10-CM | POA: Insufficient documentation

## 2010-12-30 DIAGNOSIS — Z7901 Long term (current) use of anticoagulants: Secondary | ICD-10-CM

## 2011-01-01 ENCOUNTER — Other Ambulatory Visit: Payer: Self-pay | Admitting: Cardiothoracic Surgery

## 2011-01-01 DIAGNOSIS — I2510 Atherosclerotic heart disease of native coronary artery without angina pectoris: Secondary | ICD-10-CM

## 2011-01-02 ENCOUNTER — Encounter: Payer: Self-pay | Admitting: Pulmonary Disease

## 2011-01-02 ENCOUNTER — Ambulatory Visit (INDEPENDENT_AMBULATORY_CARE_PROVIDER_SITE_OTHER): Payer: Self-pay | Admitting: Cardiothoracic Surgery

## 2011-01-02 ENCOUNTER — Ambulatory Visit
Admission: RE | Admit: 2011-01-02 | Discharge: 2011-01-02 | Disposition: A | Discharge status: Home / Self Care | Payer: Medicare Other | Source: Ambulatory Visit | Attending: Cardiothoracic Surgery | Admitting: Cardiothoracic Surgery

## 2011-01-02 DIAGNOSIS — I2510 Atherosclerotic heart disease of native coronary artery without angina pectoris: Secondary | ICD-10-CM

## 2011-01-02 DIAGNOSIS — I2699 Other pulmonary embolism without acute cor pulmonale: Secondary | ICD-10-CM

## 2011-01-02 NOTE — Procedures (Signed)
  NAME:  Eric Gordon, Eric Gordon           ACCOUNT NO.:  [id number]  MEDICAL RECORD NO.:  [id number]           PATIENT TYPE:  O  LOCATION:  6526                         FACILITY:  [facility]  PHYSICIAN:  Arturo Morton. Riley Kill, MD, FACCDATE OF BIRTH:  Nov 21, 1930  DATE OF PROCEDURE:  12/05/2010 DATE OF DISCHARGE:                           CARDIAC CATHETERIZATION   INDICATIONS:  Eric Gordon is a delightful 77 year old gentleman who has been referred for further evaluation.  Of note, the patient has had a pneumonia a year ago and has been followed by Pulmonary Medicine.  An exercise echocardiogram revealed suggestion of ischemia.  He underwent diagnostic catheterization which demonstrated a moderately high-grade RCA stenosis, and possibly moderately high-grade circumflex.  The LAD at that time was not felt to be significant, but on some views, appears more significant.  He was seen in the office by Dr. Daleen Squibb and subsequently referred for further discussion.  He was brought to the catheterization laboratory for possible percutaneous intervention and reevaluation of his LAD.  The patient is not a good candidate for measurement of FFR due to underlying asthma.  All of these options were stressed with the patient prior to bringing him to the laboratory and I met with his wife as well.  They were agreeable to proceed.  PROCEDURES: 1. Placement of catheters without left heart catheterization. 2. Selective coronary arteriography involving the left coronary     artery.  DESCRIPTION OF PROCEDURE:  The patient was brought to the cath lab, prepped and draped in usual fashion.  We used the left groin based on the previous catheterization study by Dr. Eden Emms.  A 6-French sheath was placed in the left femoral artery.  We used a JL-3.5 guiding catheter to better inject the left coronary system.  Intracoronary nitroglycerin was then administered.  All of the films were then analyzed and reviewed, compared to the  previous study and I spoke with Dr. Daleen Squibb in detail. Given the findings   Dictation ended at this point.     Arturo Morton. Riley Kill, MD, [facility]     TDS/MEDQ  D:  12/05/2010  T:  12/06/2010  Job:  [id number]  cc:   Letia Guidry C. Daleen Squibb, MD, [place] Gwen Pounds, MD  Electronically Signed by Shawnie Pons MD [facility] on 01/02/2011 05:38:49 AM

## 2011-01-02 NOTE — Procedures (Signed)
NAME:  Eric Gordon, Eric Gordon           ACCOUNT NO.:  [id number]  MEDICAL RECORD NO.:  [id number]           PATIENT TYPE:  O  LOCATION:  6526                         FACILITY:  [facility]  PHYSICIAN:  Eric Gordon. Riley Kill, MD, FACCDATE OF BIRTH:  1931-01-23  DATE OF PROCEDURE:  12/05/2010 DATE OF DISCHARGE:                           CARDIAC CATHETERIZATION   INDICATIONS:  Eric Gordon is a delightful 77 year old gentleman who had pneumonia about a year ago.  He has had exertional dyspnea.  He has been seen by Dr. Craige Gordon.  He has not improved on treatment for asthma.  He has stress echo done by that suggested ischemia and underwent diagnostic catheterization by Dr. Eden Gordon.  At that time, he was felt to have high- grade RCA disease as well as moderately high-grade circumflex disease. The LAD was not thought to be extremely tight, but was not well seen. He subsequently was referred for consideration of percutaneous intervention by Eric Gordon.  I reviewed the films carefully.  We talked with the patient at length.  I suggested that we relook at the LAD and make decisions following relook.  The patient and wife were agreeable with that approach.  He was not pretreated with thienopyridines because of this reason.  The patient was agreeable to proceed.  PROCEDURE: 1. Placement of catheters without left heart cath. 2. Coronary arteriography.  DESCRIPTION OF PROCEDURE:  We brought the patient to the cath lab and prepped and draped in usual fashion.  The left groin was used.  A 6- French sheath was placed.  Views of left coronary arteries were obtained in multiple angiographic projections using a 6-French guide, intracoronary nitroglycerin was administered to better see the left system.  He tolerated this well.  I then reviewed the films carefully, compared them to the previous films, spoke with Eric Gordon and then with both the patient and his wife.  I then called Dr. Tyrone Gordon.  After review of the films,  it was my recommendation that we consider revascularization surgery based upon complexity of LAD disease combined with three-vessel disease.  This was discussed with the patient. Subsequently, there were no major complications.  HEMODYNAMIC DATA:  Central aortic pressure 149/81.  ANGIOGRAPHIC DATA: 1. The left main was free of critical disease. 2. The left anterior descending artery is a moderately calcified     vessel.  There is 30-40% proximal disease followed by an area of     mild segmental plaque after intracoronary nitroglycerin, and there     was 80% disease involving the LAD involving also the takeoff of a     fairly narrow angle diagonal.  The diagonal has plaque as noted in     the previous study with at least two 70% lesions and is moderate in     size.  The distal LAD appears to be suitable for grafting. 3. The circumflex provides a first marginal with probably 60% ostial     narrowing in the large second marginal with segmental 70-80%     disease.  The distal vessel is excellent for grafting.  CONCLUSIONS:  Three-vessel coronary artery disease including complex LAD bifurcational disease.  DISCUSSION:  The patient has progressive exertional dyspnea.  He has three-vessel disease.  Based on the above findings, the LAD disease is complex, and I doubt that treating the right alone would be satisfactory for relief of symptoms.  I have spoken with Dr. Tyrone Gordon who will see him in consultation.     Eric Gordon. Riley Kill, MD, Eric Gordon     Gordon  D:  12/05/2010  T:  12/06/2010  Job:  [id number]  cc:   Eric Plane, MD Eric Sans. Eric Squibb, MD, [facility] Eric Pounds, MD  Electronically Signed by Shawnie Pons MD Eric Gordon on 01/02/2011 05:38:47 AM

## 2011-01-03 ENCOUNTER — Ambulatory Visit: Payer: Self-pay | Admitting: Cardiology

## 2011-01-03 LAB — POCT INR: INR: 5.4

## 2011-01-03 NOTE — Assessment & Plan Note (Signed)
OFFICE VISIT  RYER, ASATO DOB:  07-Oct-1930                                        January 02, 2011 CHART #:  [id number]  The patient returns to the office today after his recent coronary artery bypass grafting x5 done on December 11, 2010.  Subsequently, he was readmitted with increasing shortness of breath and was found to have a small pulmonary embolus in the left upper lobe.  He was started on heparin and Coumadin and now returns to the office for followup check. His Coumadin has been followed by the [facility].  He notes recently they decreased the dose from 7.5 to 5 mg a day.  He is slowly improving.  He is still on home oxygen, but with good saturations on 2 liters.  He is increasing his activity to some degree and overall feels better than the last visit to the office.  On exam initially when he came to the office, blood pressure was 89/52, repeat was 92/59, pulse was 47 and regular, respiratory rate 16, O2 sats 97% on 2 liters.  His lungs are clear bilaterally.  Cardiac exam reveals a regular rate and rhythm, has noted bradycardic.  His vein harvest sites and sternum are all healing well.  He developed phlebitis in his right arm postoperatively, this is now completely resolved.  Follow up chest x-ray shows clear lung fields bilaterally.  Because of his low heart rate, decreased his amiodarone from 200 mg b.i.d. to 200 mg once a day.  He continues on Mucinex, Crestor, Ultram, 81 mg of aspirin, Combivent, Flomax.  He is also on Lopressor 25 b.i.d., I have decreased the dose, told him to skip the dose tonight and then start tomorrow with 12.5 b.i.d.  He is also on omeprazole, Symbicort, vitamin C, vitamin D, Coumadin 5 mg a day, and O2.  Overall, he seems to be improved.  We will make some adjustments with his medication because of his lower blood pressure.  I will plan to see him, he sees Dr. Daleen Squibb, next week.  I have made  an appointment to see me in 2 weeks and at that time, we will decide if he is progressing well enough to proceed with cardiac rehab.  Sheliah Plane, MD Electronically Signed  EG/MEDQ  D:  01/02/2011  T:  01/03/2011  Job:  [id number]  cc:   Thomas C. Wall, MD, [facility]

## 2011-01-04 NOTE — Op Note (Signed)
NAME:  Eric Gordon, Eric Gordon NO.:  [id number]  MEDICAL RECORD NO.:  [id number]           PATIENT TYPE:  I  LOCATION:  2307                         FACILITY:  [facility]  PHYSICIAN:  Guadalupe Maple, M.D.  DATE OF BIRTH:  [birth year]  DATE OF PROCEDURE:  12/11/2010 DATE OF DISCHARGE:                              OPERATIVE REPORT   PROCEDURE:  Intraoperative transesophageal echocardiography.  Mr. Eric Gordon is a 77 year old male with a history of increasing episodes of shortness of breath who underwent cardiac catheterization, was found to have severe 3-vessel coronary artery disease and normal left ventricular function.  He is now scheduled to undergo coronary artery bypass grafting by Dr. Tyrone Sage.  Intraoperative transesophageal echocardiography was requested to evaluate the left and right ventricular function and determine if any valvular pathology was present.  The patient was brought to the operating room at [facility] and general anesthesia was induced without difficulty.  The trachea was intubated without difficulty.  Following orogastric suctioning, the transesophageal echocardiography probe was then inserted into the esophagus without difficulty.  IMPRESSION:  Prebypass Findings: 1. Mitral valve, there was moderate mitral annular calcification, but     the leaflets opened normally and there was trace mitral     insufficiency.  There was no evidence of prolapse or fluttering of     the valve and there was no evidence of flail segments.  The     leaflets were mildly thickened, but coapted well.  The mitral     annular calcification was most pronounced at the base of the     posterior leaflet. 2. Aortic valve.  The aortic valve was trileaflet.  The leaflets     opened normally and there was no aortic insufficiency.  There     appeared to be  filamentous attachments to the aortic leaflets     consistent with Lambl's excrescences and did not appear  consistent     with a vegetation.  These appeared to be without hemodynamic     significance. 3. Left ventricle.  There was normal-appearing left ventricular     function.  There was good contractility in all segments     interrogated.  Ejection fraction was estimated at 60%.  The left     ventricular end-diastolic diameter measured 4.18 cm at end diastole     at the mid papillary level in the short axis and measured 3.56 cm     at the end systole of the mid papillary level.  The left and     tracheal wall thickness measured 1.05 cm in diameter consistent     with mild left ventricular hypertrophy.  There was no thrombus     noted in the left ventricular apex. 4. Right ventricle.  There was normal-appearing right ventricular     function with normal contractility of the right ventricular free     wall and normal right ventricular size. 5. Tricuspid valve.  Tricuspid valve appeared structurally normal with     trace tricuspid insufficiency. 6. Interatrial septum.  The interatrial septum was intact without     evidence of patent foramen ovale or  atrioseptal defect. 7. Left atrium.  The left atrial size appeared to be within normal     limits.  There was no thrombus noted in the left atrium or left     atrial appendage. 8. Ascending aorta.  The ascending aorta was nonaneurysmal.  There was     no moderate thickening of the vessel wall, but no significant     atheromatous disease appreciated. 9. Descending aorta.  The descending aorta measured 3.1 cm in diameter     and there was moderate atheromatous disease noted.  Postbypass Findings: 1. Mitral valve.  The mitral valve was unchanged from the prebypass     study.  There was trace mitral insufficiency with moderate mitral     annular calcification. 2. Aortic valve.  The aortic valve was unchanged from the prebypass     study.  There was no aortic insufficiency and no evidence of aortic     stenosis.  There was still the filamentous  attachments to the     leaflets were noted consistent with Lambl excrescences. 3. Left ventricle.  There was normal-appearing left ventricular     function with good contractility in all segments interrogated.     Ejection fraction again estimated 60-65%. 4. Right ventricle.  There was normal-appearing right ventricular     function with good contractility of the right ventricular free     wall. 5. Tricuspid valve.  There was trace tricuspid insufficiency, which     appeared unchanged from the prebypass study.          ______________________________ Guadalupe Maple, M.D.     DCJ/MEDQ  D:  12/11/2010  T:  12/12/2010  Job:  [id number]  Electronically Signed by Kipp Brood M.D. on 01/04/2011 11:41:57 AM

## 2011-01-06 ENCOUNTER — Encounter: Payer: Self-pay | Admitting: Pulmonary Disease

## 2011-01-06 ENCOUNTER — Ambulatory Visit (INDEPENDENT_AMBULATORY_CARE_PROVIDER_SITE_OTHER): Payer: BLUE CROSS/BLUE SHIELD | Admitting: Pulmonary Disease

## 2011-01-06 DIAGNOSIS — J45909 Unspecified asthma, uncomplicated: Secondary | ICD-10-CM

## 2011-01-06 DIAGNOSIS — R0602 Shortness of breath: Secondary | ICD-10-CM

## 2011-01-06 DIAGNOSIS — I2699 Other pulmonary embolism without acute cor pulmonale: Secondary | ICD-10-CM

## 2011-01-06 DIAGNOSIS — R0902 Hypoxemia: Secondary | ICD-10-CM

## 2011-01-06 NOTE — Assessment & Plan Note (Signed)
Reviewed his inhaler use.  He is to continue with scheduled symbicort for now.  Advised he can use combivent as needed.  Advised he does not need to use his nebulizer, and can have this returned to DME.

## 2011-01-06 NOTE — Assessment & Plan Note (Signed)
He has post-operative hypoxemia 2nd to PE, pleural effusion, and atelectasis.  This has improved.  He states his oxygen is being managed by Dr. Tyrone Sage, and will defer further assessment of home oxygen need to cardiothoracic surgery.

## 2011-01-06 NOTE — Progress Notes (Signed)
Subjective:    Patient ID: Eric Gordon, male    DOB: 1930/09/30, 77 y.o.   MRN: [id number]  HPI 77 yo male with dyspnea, asthma.  Since his last visit he was found to had CAD.  He underwent CABG in March.  This was complicated post-op by PAF, arm cellulitis, and pleural effusion.  He was discharged, but then re-admitted with pulmonary embolism.  He was started on coumadin and sent home with oxygen.  He is currently using oxygen 2 liters with exertion.  His is not having much cough or sputum.  He has not been wheezing.  He was sent home with a nebulizer, but never got the medicine for this.  He has been using symbicort twice per day, and was told to use combivent before this.  He does not use combivent in between.       Past Medical History  Diagnosis Date  . Hypertension   . S/P CABG (coronary artery bypass graft)   . CAD (coronary artery disease)   . GERD (gastroesophageal reflux disease)      Family History  Problem Relation Age of Onset  . Coronary artery disease Mother   . Hypertension Mother      History   Social History  . Marital Status: Married    Spouse Name: N/A    Number of Children: N/A  . Years of Education: N/A   Occupational History  . Sales    Social History Main Topics  . Smoking status: Former Smoker -- 1.0 packs/day for 35 years    Types: Cigarettes    Quit date: 09/23/1983  . Smokeless tobacco: Not on file  . Alcohol Use: Not on file  . Drug Use: Not on file  . Sexually Active: Not on file   Other Topics Concern  . Not on file   Social History Narrative  . No narrative on file     Allergies  Allergen Reactions  . Ancef (Cefazolin Sodium)   . Vancomycin      Outpatient Prescriptions Prior to Visit  Medication Sig Dispense Refill  . albuterol-ipratropium (COMBIVENT) 18-103 MCG/ACT inhaler Inhale 2 puffs into the lungs every 6 (six) hours as needed.        . Ascorbic Acid (VITAMIN C) 500 MG tablet Take 500 mg by mouth daily.         Marland Kitchen aspirin 81 MG tablet Take 81 mg by mouth daily.        . budesonide-formoterol (SYMBICORT) 160-4.5 MCG/ACT inhaler Inhale 2 puffs into the lungs 2 (two) times daily.        . cholecalciferol (VITAMIN D) 1000 UNITS tablet Take 1,000 Units by mouth daily.        Marland Kitchen omeprazole (PRILOSEC) 20 MG capsule Take 20 mg by mouth daily.        . Tamsulosin HCl (FLOMAX) 0.4 MG CAPS Take 0.4 mg by mouth daily.        . celecoxib (CELEBREX) 200 MG capsule Take 200 mg by mouth daily.        . fish oil-omega-3 fatty acids 1000 MG capsule Take 1 g by mouth daily.        . hydrochlorothiazide 25 MG tablet Take 25 mg by mouth daily.        Marland Kitchen losartan (COZAAR) 100 MG tablet Take 100 mg by mouth daily.        . simvastatin (ZOCOR) 40 MG tablet Take 40 mg by mouth at bedtime.  Review of Systems     Objective:   Physical Exam  Filed Vitals:   01/06/11 1443 01/06/11 1444  BP:  118/62  Pulse:  69  Temp: 98.1 F (36.7 C)   TempSrc: Oral   Height: 6\' 3"  (1.905 m)   Weight: 222 lb 9.6 oz (100.971 kg)   SpO2:  98%      General:  normal appearance, using supplemental oxygen.   Nose:  no deformity, discharge, inflammation, or lesions   Mouth:  no deformity or lesions   Neck:  no JVD.   Lungs:  diminished breath sounds, no wheezing or rales.   Heart:  regular rhythm, normal rate, and no murmurs.   Extremities:  no clubbing, cyanosis, edema, or deformity noted   Neurologic:  normal CN II-XII and strength normal.   Cervical Nodes:  no significant adenopathy   Psych:  alert and cooperative; normal mood and affect; normal attention span and concentration    Assessment & Plan:   PE (pulmonary embolism) He was found to have Lt upper lobe PE after recent cardiac surgery.  He is on coumadin through coumadin clinic.  ASTHMA Reviewed his inhaler use.  He is to continue with scheduled symbicort for now.  Advised he can use combivent as needed.  Advised he does not need to use his nebulizer, and  can have this returned to DME.  DYSPNEA Related to asthma, PE, diastolic dysfunction, and CAD.  He is s/p CABG in March 2012.  His recent chest xray showed improved pleural effusions and atelectasis.  He reports symptomatic improvement.  Hypoxemia He has post-operative hypoxemia 2nd to PE, pleural effusion, and atelectasis.  This has improved.  He states his oxygen is being managed by Dr. Tyrone Sage, and will defer further assessment of home oxygen need to cardiothoracic surgery.    Updated Medication List Outpatient Encounter Prescriptions as of 01/06/2011  Medication Sig Dispense Refill  . albuterol-ipratropium (COMBIVENT) 18-103 MCG/ACT inhaler Inhale 2 puffs into the lungs every 6 (six) hours as needed.        Marland Kitchen amiodarone (PACERONE) 200 MG tablet Once a day       . Ascorbic Acid (VITAMIN C) 500 MG tablet Take 500 mg by mouth daily.        Marland Kitchen aspirin 81 MG tablet Take 81 mg by mouth daily.        . budesonide-formoterol (SYMBICORT) 160-4.5 MCG/ACT inhaler Inhale 2 puffs into the lungs 2 (two) times daily.        . cholecalciferol (VITAMIN D) 1000 UNITS tablet Take 1,000 Units by mouth daily.        Marland Kitchen guaiFENesin (MUCINEX) 600 MG 12 hr tablet Once a day in the am       . metoprolol tartrate (LOPRESSOR) 25 MG tablet 1/2 tablet twice a day       . omeprazole (PRILOSEC) 20 MG capsule Take 20 mg by mouth daily.        . rosuvastatin (CRESTOR) 10 MG tablet Once a day       . Tamsulosin HCl (FLOMAX) 0.4 MG CAPS Take 0.4 mg by mouth daily.        . traMADol (ULTRAM) 50 MG tablet 1 every 4-6 hrs as needed       . warfarin (COUMADIN) 5 MG tablet As directed       . celecoxib (CELEBREX) 200 MG capsule Take 200 mg by mouth daily.        . fish oil-omega-3 fatty acids 1000  MG capsule Take 1 g by mouth daily.        . hydrochlorothiazide 25 MG tablet Take 25 mg by mouth daily.        Marland Kitchen losartan (COZAAR) 100 MG tablet Take 100 mg by mouth daily.        . simvastatin (ZOCOR) 40 MG tablet Take 40 mg by  mouth at bedtime.

## 2011-01-06 NOTE — Patient Instructions (Signed)
Continue symbicort two puffs twice per day Combivent two puffs as needed up to four times per day for cough, wheeze, shortness of breath, or chest congestion Follow up in 3 to 4 months

## 2011-01-06 NOTE — Assessment & Plan Note (Signed)
He was found to have Lt upper lobe PE after recent cardiac surgery.  He is on coumadin through coumadin clinic.

## 2011-01-06 NOTE — Assessment & Plan Note (Signed)
Related to asthma, PE, diastolic dysfunction, and CAD.  He is s/p CABG in March 2012.  His recent chest xray showed improved pleural effusions and atelectasis.  He reports symptomatic improvement.

## 2011-01-10 ENCOUNTER — Encounter: Payer: Self-pay | Admitting: Cardiology

## 2011-01-10 ENCOUNTER — Ambulatory Visit (INDEPENDENT_AMBULATORY_CARE_PROVIDER_SITE_OTHER): Payer: Medicare Other | Admitting: *Deleted

## 2011-01-10 ENCOUNTER — Ambulatory Visit (INDEPENDENT_AMBULATORY_CARE_PROVIDER_SITE_OTHER): Payer: BLUE CROSS/BLUE SHIELD | Admitting: Cardiology

## 2011-01-10 VITALS — BP 110/74 | HR 68 | Ht 75.0 in | Wt 220.0 lb

## 2011-01-10 DIAGNOSIS — I2510 Atherosclerotic heart disease of native coronary artery without angina pectoris: Secondary | ICD-10-CM

## 2011-01-10 DIAGNOSIS — I4891 Unspecified atrial fibrillation: Secondary | ICD-10-CM

## 2011-01-10 DIAGNOSIS — I2699 Other pulmonary embolism without acute cor pulmonale: Secondary | ICD-10-CM

## 2011-01-10 DIAGNOSIS — Z7901 Long term (current) use of anticoagulants: Secondary | ICD-10-CM

## 2011-01-10 DIAGNOSIS — I480 Paroxysmal atrial fibrillation: Secondary | ICD-10-CM | POA: Insufficient documentation

## 2011-01-10 LAB — POCT INR: INR: 3.4

## 2011-01-10 NOTE — Progress Notes (Signed)
   Patient ID: Eric Gordon, male    DOB: [birth year], 77 y.o.   MRN: [id number]  HPI  Eric Gordon returns for post hospital visit. He was cathed for DOE and found to have significant CAD. He underwent CABG with Dr Tyrone Sage. Post op course complicated by PAF and RUE cellulitis. After discharge he had increasing SOB and was diagnosed with a LUL  pulmonary embolus. He saw Gerhardt on 01/02/11 and his amiodarone was decreased to 200 mg/day and his metoprolol to 12.5 mg/day for bradycardia. His heart rate is 68 and regular today. He is still wearing 02. He is hoping to start Cardiac Rehab soon.  He has some dull aching over the LIMA site. He has lost over 20 lbs since surgery.    Review of Systems  Constitutional: Positive for activity change, appetite change and unexpected weight change.  Respiratory: Positive for shortness of breath. Negative for cough, choking, chest tightness and wheezing.   Cardiovascular: Positive for chest pain. Negative for palpitations and leg swelling.  Gastrointestinal: Negative for abdominal distention.  Neurological: Positive for weakness. Negative for dizziness, light-headedness and numbness.      Physical Exam  Constitutional: He is oriented to person, place, and time. He appears well-developed and well-nourished. No distress.  HENT:  Head: Normocephalic and atraumatic.  Eyes: EOM are normal.  Neck: Neck supple. No JVD present. No tracheal deviation present. No thyromegaly present.  Cardiovascular: Normal rate, regular rhythm, normal heart sounds and intact distal pulses.  Exam reveals no gallop and no friction rub.   No murmur heard. Pulmonary/Chest: Effort normal and breath sounds normal. He has no wheezes. He has no rales.  Abdominal: Bowel sounds are normal. He exhibits no distension.  Musculoskeletal: Normal range of motion. He exhibits no edema.  Neurological: He is alert and oriented to person, place, and time.  Skin: Skin is warm and dry. He is  not diaphoretic.

## 2011-01-10 NOTE — Patient Instructions (Signed)
Your physician recommends that you schedule a follow-up appointment in: July 11,12 at 9:00am with Dr. Daleen Squibb

## 2011-01-13 NOTE — Discharge Summary (Signed)
NAME:  Eric Gordon, Eric Gordon NO.:  [id number]  MEDICAL RECORD NO.:  [id number]           PATIENT TYPE:  I  LOCATION:  2030                         FACILITY:  [facility]  PHYSICIAN:  Sheliah Plane, MD    DATE OF BIRTH:  [birth year]  DATE OF ADMISSION:  12/23/2010 DATE OF DISCHARGE:                              DISCHARGE SUMMARY   FINAL DIAGNOSES: 1. Left-sided pulmonary embolism acute, status post coronary artery     bypass grafting. 2. Moderate bilateral pleural effusion status post coronary artery     bypass grafting.  SECONDARY DIAGNOSES: 1. Coronary artery disease status post coronary artery bypass grafting     x5 done by Dr. Tyrone Sage on December 11, 2010. 2. Hypertension. 3. Cerebrovascular accident with left ICA 68%. 4. Hyperlipidemia. 5. Obstructive sleep apnea, use CPAP at night. 6. Gastroesophageal reflux disease with Barrett esophagus. 7. Benign prostatic hypertrophy 8. Postoperative atrial fibrillation following coronary artery bypass     grafting. 9. Acute blood loss anemia. 10.Erectile dysfunction. 11.Upper extremity cellulitis secondary to IV infiltration status post     coronary artery bypass grafting. 12.Status post penile implant. 13.Status post right rotator cuff surgery. 14.Status post right Achilles heel. 15.Status post right knee surgery repair. 16.Status post left inguinal hernia repair. 17.Status post right and left ankle surgery.  PATIENT'S HISTORY AND PHYSICAL AND HOSPITAL COURSE:  The patient is a 77- year-old male who is status post coronary artery bypass grafting x5 done by Dr. Tyrone Sage on December 11, 2010.  The patient was recently discharged to [facility] on December 21, 2010.  The patient states he had a fairly good day on Sunday.  He walked several times where he did have some shortness of breath.  His shortness of breath gradually increased with rest and worse with exertion and he also began experiencing right- sided rib pain.   The patient was seen by Dr. Tyrone Sage on Monday December 23, 2010.  Chest x-ray done showed small bilateral pleural effusions greater on the left than the right.  There is improved atelectasis of the left base and no pneumothorax.  The patient denies any fever, chills, orthopnea or cough.  At that time it was felt that the patient would require admission to [facility] for further evaluation.  For further details of the patient's past medical history and physical exam, please see dictated H and P.  [name] was admitted to [facility] on December 23, 2010, with shortness of breath status post coronary artery bypass grafting.  A CT angio the chest was done on December 23, 2010, which showed left upper lobe pulmonary artery focal filling defect consistent with acute pulmonary embolism.  There is moderate bilateral pleural effusions and small pericardial effusions noted.  Following diagnosis of PE, the patient was started on IV heparin.  Pharmacy was consulted to those.  Critical Care Medicine/pulmonary was consulted on the patient's admission.  They saw and evaluated the patient.  BNP ordered, noted to be 345.  Given an order of Solu-Medrol IV to the patient.  They discontinued Symbicort and Combivent and started the patient on albuterol nebulizer as needed and Pulmicort  b.i.d.  Pulmonary continued to follow the patient during his postoperative course and adjusted his pulmonary meds as needed.  The patient's shortness of breath improved and his right rib pain improved as well.  He was started on p.o. Coumadin and daily INRs were checked.  It was discussed with pulmonary on sending the patient home on Coumadin as well as Lovenox injections versus continuing IV heparin until the patient therapeutic on Coumadin.  After further discussion, it was felt that the patient could go home with p.o. Coumadin and Lovenox injections.  Dosing of Lovenox was arranged per pharmacy.  Home health  nurse was arranged as well. Pulmonary plans to start the patient on p.o. prednisone today and continue for 7 days.  Plan will be for home health nurse to check PT/INR levels and fax results to Dr. Vern Claude office for further management. During this time, the patient's vital signs were followed and patient remained afebrile, normal sinus rhythm, blood pressure stable.  He is currently off oxygen with O2 saturations maintaining greater than 90% on room air.  He is currently up ambulating without difficulty.  He is tolerating diet well.  No nausea or vomiting noted.  His incisions remained clean, dry and intact and healing well.  His right upper extremity phlebitis that he was discharged to home with prior has resolved.  Most recent lab work shows INR of 1.18, white blood cell count of 17.9, hemoglobin of 11.4, hematocrit 35.4, platelet count 402. Sodium of 137, potassium 4.6, chloride of 99, bicarbonate 26, BUN is 16, creatinine 1.27, glucose 126.  The patient is tentatively ready for discharge to home in the a.m. December 26, 2010, pending he remains stable.  FOLLOWUP APPOINTMENTS:  Followup appointment was arranged for the patient to see Dr. Tyrone Sage January 02, 2011, at 2 o'clock p.m.  He will need to obtain AP and lateral chest x-ray 45 minutes prior to his appointment.  He will need to follow up with Dr. Daleen Squibb in 2 weeks.  He will need to contact his office to make these arrangements.  The patient also need to follow up with Dr. Craige Cotta in 2-4 weeks.  He will need to contact his office to make these arrangements.  Home health nurse has been set up to draw PT/INR levels on December 27, 2010, and fax results to Dr. Vern Claude office.  ACTIVITY:  The patient is instructed no driving until released to do so, no lifting over 10 pounds.  He is told to ambulate 3-4 three times per day and progress as tolerated and continue his breathing exercises.  INCISIONAL CARE:  The patient is told to shower washing his  incisions using soap and water.  He is to contact the office if he develops any drainage or opening from any of his incision sites.  DIET:  The patient is educated on diet to be low fat, low salt.  DISCHARGE MEDICATIONS: 1. Pulmicort 0.5 mg/2 mL b.i.d. 2. Lovenox injections 165 mg subcu q.24 h. 3. Lasix 40 mg daily x7 days. 4. Atrovent 0.2 mg/mL q.6 h. 5. Xopenex 0.63 mg per 3 mL every 6 hours. 6. Oxycodone 5 mg 1-2 tablets q.6 h. p.r.n. 7. Potassium chloride 20 mEq daily x7 days. 8. Prednisone 40 mg daily x5 days. 9. Coumadin 7.5 mg at night. 10.Amiodarone 200 mg b.i.d. 11.Enteric-coated aspirin 81 mg daily. 12.Doxycycline 100 mg b.i.d. x2 days. 13.Flomax 0.4 mg daily. 14.Guaifenesin 600 mg daily. 15.Metoprolol 25 mg b.i.d.. 16.Omeprazole 20 mg daily. 17.Crestor 10 mg daily. 18.Ultram  50 mg 1 tablet q.4-6 h. p.r.n. pain. 19.Vitamin C 500 mg daily. 20.Vitamin D3 1000 units daily.     Sol Blazing, PA   ______________________________ Sheliah Plane, MD    KMD/MEDQ  D:  12/25/2010  T:  12/26/2010  Job:  [id number]  cc:   Sheliah Plane, MD Coralyn Helling, MD Jesse Sans. Daleen Squibb, MD, [facility]  Electronically Signed by Cameron Proud PA on 12/31/2010 10:57:59 AM Electronically Signed by Sheliah Plane MD on 01/13/2011 11:01:18 AM

## 2011-01-13 NOTE — Discharge Summary (Signed)
  NAME:  Eric Gordon, Eric Gordon NO.:  [id number]  MEDICAL RECORD NO.:  [id number]           PATIENT TYPE:  I  LOCATION:  2030                         FACILITY:  [facility]  PHYSICIAN:  Sheliah Plane, MD    DATE OF BIRTH:  29-Aug-1931  DATE OF ADMISSION:  12/23/2010 DATE OF DISCHARGE:                              DISCHARGE SUMMARY   ADDENDUM  The patient was seen by Pulmonary prior to discharge and they recommended the patient staying on Coumadin for a total of 3 months. The patient's Coumadin level will be followed by Dr. Smith Robert.     Sol Blazing, PA   ______________________________ Sheliah Plane, MD    KMD/MEDQ  D:  12/25/2010  T:  12/26/2010  Job:  [id number]  Electronically Signed by Cameron Proud PA on 12/31/2010 10:57:07 AM Electronically Signed by Sheliah Plane MD on 01/13/2011 11:01:07 AM

## 2011-01-14 ENCOUNTER — Inpatient Hospital Stay: Payer: Medicare Other | Admitting: Pulmonary Disease

## 2011-01-16 ENCOUNTER — Ambulatory Visit: Payer: Medicare Other | Admitting: Cardiothoracic Surgery

## 2011-01-16 ENCOUNTER — Ambulatory Visit (INDEPENDENT_AMBULATORY_CARE_PROVIDER_SITE_OTHER): Payer: Self-pay | Admitting: Cardiothoracic Surgery

## 2011-01-16 DIAGNOSIS — I2510 Atherosclerotic heart disease of native coronary artery without angina pectoris: Secondary | ICD-10-CM

## 2011-01-17 ENCOUNTER — Ambulatory Visit: Payer: Self-pay | Admitting: Internal Medicine

## 2011-01-17 NOTE — Assessment & Plan Note (Signed)
OFFICE VISIT  Eric Gordon, ADINOLFI DOB:  [birth year]                                        January 16, 2011 CHART #:  [id number]  The patient returns to the office today, continues on 2 liters of oxygen.  He had undergone coronary artery bypass grafting x5 on December 11, 2010.  Subsequently, was readmitted with increasing shortness of breath and was found to have a small pulmonary embolus in the left upper lobe.  It is unclear if this was really significant enough to cause his degree of shortness of breath, but he was started on Coumadin and has been followed in the [facility].  Since he remains on home oxygen at 2 liters, he is increasing his activity, he is anxious to return to driving, and also to start in the cardiac rehab program.  His blood pressure, one measurement 91/59, other is 117/63 and 112/61, pulse is 82, respiratory rate 16, O2 sat on 2 liters is 98% at rest.  His sternum is stable and well healed.  His lungs are clear bilaterally.  His endovein harvest site is healing well.  He has no pedal edema.  He did not have a chest x-ray today.  He does continue on: 1. Amiodarone 200 mg a day. 2. Crestor 10 mg a day. 3. Ultram 3 tablets a day. 4. Flomax 0.4 a day. 5. Lopressor 12.5 b.i.d. 6. Symbicort. 7. Vitamin C. 8. Vitamin D. 9. Coumadin monitored by the [facility] and O2.  Overall, he seems to be making slow, but steady improvement in his functional status, still with his respiratory issue is the main limiting factor.  I have encouraged him to starting the cardiac rehab program next week depending on how he does with this and oxygen levels with exertion, we will work over the next month and decreasing his O2 use.  I will plan to see him back in 1 month with a followup chest x-ray.  He has appointment to see Dr. Craige Cotta in 4 months and Dr. Daleen Squibb in July.  Sheliah Plane, MD Electronically Signed  EG/MEDQ  D:  01/16/2011  T:   01/17/2011  Job:  [id number]  cc:   Thomas C. Daleen Squibb, MD, [facility] Coralyn Helling, MD

## 2011-01-17 NOTE — Consult Note (Signed)
NAME:  Eric Gordon, Eric Gordon NO.:  [id number]  MEDICAL RECORD NO.:  [id number]           PATIENT TYPE:  I  LOCATION:  2030                         FACILITY:  [facility]  PHYSICIAN:  Rollene Rotunda, MD, FACCDATE OF BIRTH:  1930-10-10  DATE OF CONSULTATION:  12/23/2010 DATE OF DISCHARGE:                                CONSULTATION   PRIMARY CARE PHYSICIAN:  Gwen Pounds, MD  PRIMARY CARDIOLOGIST:  Jesse Sans. Wall, MD, [facility]  CHIEF COMPLAINT:  Shortness of breath.  HISTORY OF PRESENT ILLNESS:  Eric Gordon is a 77 year old male with a history of coronary artery disease.  He had been hospitalized from December 05, 2010 through December 06, 2010, where he was diagnosed with coronary artery disease and then had bypass surgery x5 on December 16, 2010, being discharged on December 21, 2010.  His course was complicated by right upper extremity cellulitis at an IV site and PAF with RVR.  On December 22, 2010, the patient states he had a good day.  Today, he was short of breath at rest on waking.  He also had chest pain and saw Dr. Tyrone Sage. He was sent over for admission.  Since discharge, Eric Gordon feels that his lower extremity edema has improved.  He has right-sided chest pain at the lower rib edge in the front and back.  It is sharp and worse with deep inspiration.  It is not like his pre CABG symptoms.  There is no change with position.  He denies PND or orthopnea until today.  Today, he feels short of breath and describes PND as well as orthopnea.  He has not had palpitations. Currently on O2, his symptoms are somewhat improved.  PAST MEDICAL HISTORY: 1. Status post aortocoronary bypass surgery on December 17, 2010 with     LIMA to LAD, SVG to diagonal, SVG to OM1 and OM2, SVG to RCA. 2. Paroxysmal atrial fibrillation. 3. History of tobacco use. 4. Hypertension. 5. Cerebrovascular disease with left carotid 60-80%. 6. Hyperlipidemia. 7. Gastroesophageal reflux disease and Barrett  esophagus. 8. OSA, on CPAP. 9. History of BPH. 10.Acute blood loss anemia. 11.Grade 1 diastolic dysfunction with an EF of 60-65% by     echocardiogram on December 06, 2010. 12.History of erectile dysfunction, status post penile pump. 13.Nephrolithiasis and overactive bladder. 14.History of diverticulosis and colon polyps. 15.Benign essential tremor. 16.History of plantar fasciitis. 17.3.3 x 3.2 infrarenal abdominal aortic aneurysm.  SURGICAL HISTORY:  He is status post cardiac catheterizations as well as bypass surgery, hernia repair, and knee surgery.  He has also had an EGD and colonoscopy.  ALLERGIES:  He is allergic or intolerant to PROPRANOLOL, AMLODIPINE, ANCEF, and developed blisters with VANCOMYCIN.  CURRENT MEDICATIONS AT DISCHARGE: 1. Amiodarone 400 mg b.i.d. for 10 days, then 200 mg b.i.d. 2. Mucinex 600 mg q.12 h. p.r.n. 3. Crestor 10 mg daily. 4. Ultram p.r.n. 5. Aspirin 325 mg a day. 6. Combivent p.r.n. 7. Flomax 0.4 mg a day. 8. Lopressor 25 mg b.i.d. 9. Omeprazole 20 mg a day. 10.Symbicort b.i.d. 11.Vitamin C and vitamin D daily.  SOCIAL HISTORY:  He lives in [place] with his wife.  He  is retired from [employer].  He quit tobacco in 1985 with approximately 40-pack-year history.  He denies alcohol or drug abuse.  FAMILY HISTORY:  This father died at 66 with a AAA and his mother died at 66.  Both his mother and some siblings have a history of coronary artery disease.  REVIEW OF SYSTEMS:  He has not had any fevers or chills.  He has not had reflux symptoms or melena.  Full 14-point review of systems is otherwise negative except as stated in the HPI.  PHYSICAL EXAMINATION:  VITAL SIGNS:  Temperature is 97.6, blood pressure 138/79, pulse 74, respiratory rate 18, O2 saturation 92% on room air. Pulsus paradoxus was negative. GENERAL:  He is a well-developed elderly white male who is short of breath. HEENT:  Normal. NECK:  There is no lymphadenopathy, thyromegaly  and no bruit appreciated, but the patient has trouble holding his breath.  He has JVD at approximately 14 cm. CV:  His heart is regular in rate and rhythm with an S1-S2 and no murmur or gallop is noted.  Of note, there is no rub.  Distal pulses are intact in all four extremities. LUNGS:  He has decreased breath sounds and bronchial breath sounds at both bases. SKIN:  His incision is healing well.  There is still erythema at his right antecubital area, but per the patient this is much improved. Abdomen:  Soft and slightly tender with active bowel sounds. EXTREMITIES:  There is no cyanosis or clubbing and he has 1+ edema. MUSCULOSKELETAL:  There is no joint deformity or effusions and no spine or CVA tenderness. NEUROLOGIC:  He is alert and oriented.  Cranial nerves II through XII grossly intact.  Chest x-ray shows small bilateral pleural effusions, but resolution of most of the atelectasis at the left lung base and no pneumothorax.  CT angiogram of the chest is pending.  EKG is pending.  LABORATORY VALUES:  Hemoglobin 11.9, hematocrit 36.2, WBCs 13.3, platelets 357.  D-dimer 3.94.  Other labs pending including a BNP, cardiac panel and BMET.  IMPRESSION:  Eric Gordon was seen today by Dr. Antoine Poche, the patient evaluated and the data reviewed.  He is a very pleasant 77 year old male with recent bypass surgery.  He had been doing well, but now presents with acute right and left back and left lower chest pain with dyspnea. He denies PND, orthopnea and there have been no fevers, chills, nausea, vomiting, or diaphoresis.  Acute dyspnea with sharp back and chest pain.  We agree that we will need to be concerned about pulmonary embolus.  There is no evidence of pneumonia and no pulsus paradoxus.  We will check an echo.  If there is no evidence of pulmonary embolism, to rule out an effusion and tamponade.  We will be looking closely for loculated effusion.     Theodore Demark,  PA-C   ______________________________ Rollene Rotunda, MD, [facility]    RB/MEDQ  D:  12/23/2010  T:  12/24/2010  Job:  [id number]  Electronically Signed by Theodore Demark PA-C on 12/25/2010 01:44:20 PM Electronically Signed by Rollene Rotunda MD [facility] on 01/17/2011 11:46:12 AM

## 2011-01-24 ENCOUNTER — Telehealth: Payer: Self-pay | Admitting: Cardiology

## 2011-01-24 NOTE — Telephone Encounter (Signed)
Pt's wife calling re info she needs on pt's condition-pls call

## 2011-01-24 NOTE — Telephone Encounter (Signed)
Wife is concerned that cardiac rehab does not want pt to participate because pt has a small AAA of which Dr. Daleen Squibb and Dr. Tyrone Sage are aware.  Cardiac rehab did send a fax regarding pt using oxygen at 2 liters during exercise. Reassured pt and faxed cardiac rehab information. Mylo Red RN

## 2011-01-24 NOTE — Telephone Encounter (Signed)
LOV,12 faxed to Maria/Cardiac Rehab  @  [phone] 01/24/11/km

## 2011-01-27 ENCOUNTER — Encounter (HOSPITAL_COMMUNITY): Payer: Medicare Other | Attending: Cardiology

## 2011-01-27 DIAGNOSIS — Z7982 Long term (current) use of aspirin: Secondary | ICD-10-CM | POA: Insufficient documentation

## 2011-01-27 DIAGNOSIS — K219 Gastro-esophageal reflux disease without esophagitis: Secondary | ICD-10-CM | POA: Insufficient documentation

## 2011-01-27 DIAGNOSIS — Z951 Presence of aortocoronary bypass graft: Secondary | ICD-10-CM | POA: Insufficient documentation

## 2011-01-27 DIAGNOSIS — Z5189 Encounter for other specified aftercare: Secondary | ICD-10-CM | POA: Insufficient documentation

## 2011-01-27 DIAGNOSIS — I200 Unstable angina: Secondary | ICD-10-CM | POA: Insufficient documentation

## 2011-01-27 DIAGNOSIS — I2510 Atherosclerotic heart disease of native coronary artery without angina pectoris: Secondary | ICD-10-CM | POA: Insufficient documentation

## 2011-01-27 DIAGNOSIS — E785 Hyperlipidemia, unspecified: Secondary | ICD-10-CM | POA: Insufficient documentation

## 2011-01-27 DIAGNOSIS — Z8673 Personal history of transient ischemic attack (TIA), and cerebral infarction without residual deficits: Secondary | ICD-10-CM | POA: Insufficient documentation

## 2011-01-27 DIAGNOSIS — I10 Essential (primary) hypertension: Secondary | ICD-10-CM | POA: Insufficient documentation

## 2011-01-27 DIAGNOSIS — I6529 Occlusion and stenosis of unspecified carotid artery: Secondary | ICD-10-CM | POA: Insufficient documentation

## 2011-01-27 DIAGNOSIS — I519 Heart disease, unspecified: Secondary | ICD-10-CM | POA: Insufficient documentation

## 2011-01-27 DIAGNOSIS — R5381 Other malaise: Secondary | ICD-10-CM | POA: Insufficient documentation

## 2011-01-27 DIAGNOSIS — Z7901 Long term (current) use of anticoagulants: Secondary | ICD-10-CM | POA: Insufficient documentation

## 2011-01-27 DIAGNOSIS — I2699 Other pulmonary embolism without acute cor pulmonale: Secondary | ICD-10-CM | POA: Insufficient documentation

## 2011-01-27 DIAGNOSIS — G4733 Obstructive sleep apnea (adult) (pediatric): Secondary | ICD-10-CM | POA: Insufficient documentation

## 2011-01-28 ENCOUNTER — Ambulatory Visit (INDEPENDENT_AMBULATORY_CARE_PROVIDER_SITE_OTHER): Payer: BLUE CROSS/BLUE SHIELD | Admitting: *Deleted

## 2011-01-28 DIAGNOSIS — Z7901 Long term (current) use of anticoagulants: Secondary | ICD-10-CM

## 2011-01-28 DIAGNOSIS — I2699 Other pulmonary embolism without acute cor pulmonale: Secondary | ICD-10-CM

## 2011-01-28 LAB — POCT INR: INR: 2.6

## 2011-01-29 ENCOUNTER — Encounter (HOSPITAL_COMMUNITY): Payer: Medicare Other

## 2011-01-31 ENCOUNTER — Encounter (HOSPITAL_COMMUNITY): Payer: Medicare Other

## 2011-02-03 ENCOUNTER — Encounter (HOSPITAL_COMMUNITY): Payer: Medicare Other

## 2011-02-04 NOTE — Procedures (Signed)
CAROTID DUPLEX EXAM   INDICATION:  Carotid bruit, ECA stenosis.   HISTORY:  Diabetes:  No.  Cardiac:  No.  Hypertension:  Yes.  Smoking:  Previous.  Previous Surgery:  No.  CV History:  Asymptomatic.  Amaurosis Fugax No, Paresthesias No, Hemiparesis No.                                       RIGHT             LEFT  Brachial systolic pressure:         122               122  Brachial Doppler waveforms:         Normal            Normal  Vertebral direction of flow:        Antegrade         Antegrade  DUPLEX VELOCITIES (cm/sec)  CCA peak systolic                   91                78  ECA peak systolic                   139               421  ICA peak systolic                   83                105  ICA end diastolic                   25                33  PLAQUE MORPHOLOGY:                  Heterogenous      Heterogenous  PLAQUE AMOUNT:                      Mild              Mild  PLAQUE LOCATION:                    ICA/ECA           ICA/ECA/CCA   IMPRESSION:  1. 1-39% stenosis of the bilateral internal carotid arteries.  2. Stenosis of the left external carotid artery noted.  3. No significant change noted when compared to the previous      examination on 07/05/2004.    The preliminary report was faxed to Dr. Ferd Hibbs office on 07/31/2008.       ___________________________________________  Di Kindle. Edilia Bo, M.D.   CH/MEDQ  D:  07/31/2008  T:  07/31/2008  Job:  [id number]   cc:   Dr. Moses Manners

## 2011-02-05 ENCOUNTER — Encounter (HOSPITAL_COMMUNITY): Payer: Medicare Other

## 2011-02-07 ENCOUNTER — Encounter (HOSPITAL_COMMUNITY): Payer: Medicare Other

## 2011-02-07 NOTE — Op Note (Signed)
NAME:  Eric Gordon, Eric Gordon NO.:  [id number]   MEDICAL RECORD NO.:  [id number]          PATIENT TYPE:  AMB   LOCATION:  DSC                          FACILITY:  [facility]   PHYSICIAN:  Leonides Grills, M.D.     DATE OF BIRTH:  [birth year]   DATE OF PROCEDURE:  10/09/2004  DATE OF DISCHARGE:                                 OPERATIVE REPORT   PREOPERATIVE DIAGNOSES:  1.  Left Achilles tendinitis/tendinosus.  2.  Left tight gastroc.   POSTOPERATIVE DIAGNOSES:  1.  Left Achilles tendinitis/tendinosus.  2.  Left tight gastroc.   PROCEDURE:  1.  Left gastroc slide.  2.  Left flexor hallucis longus to calcaneus tendon transfer.  3.  Left debridement, Achilles tendon.   ANESTHESIA:  General endotracehal tube with popliteal block.   SURGEON:  Leonides Grills, M.D.   ASSESSMENT:  Lianne Cure, P.A.-C.   ESTIMATED BLOOD LOSS:  Minimal.   TOURNIQUET TIME:  Approximately 45 minutes.   COMPLICATIONS:  None.   DISPOSITION:  Stable to PR.   INDICATIONS FOR PROCEDURE:  This is a 77 year old male with longstanding  Achilles tendinosus with nodule formation, with persistent pain.  He had the  same, exact procedure on the contralateral side and had the above procedure,  and with excellent results.  Due to persistent pain and discomfort that was  interefering with his life, preventing him from doing what he wants to do,  despite conservative management; he was consented for the above procedure.  All risks which include infection, nerve and vessel injury, persistent pain,  worsening pain, prolonged recovery, possible Achilles tendon rupture, great  toe flexion weakness and push-off weakness were all explained. Questions  were encouraged and answered.   DESCRIPTION OF PROCEDURE:  The patient was brought to the operating room,  placed in the supine position.  Initially after adequate general  endotracehal tube anesthesia was administered with popliteal block as well  as 1 g Ancef IV  piggy-back.  The patient was then placed in the sloppy  lateral position with the operative side down, and left lower extremity was  then prepped and draped in a sterile manner over a proximally placed thigh  tourniquet.  Limb was gravity exsanguinated and tourniquet was elevated 290  mmHg.   Longitudinal incision over medial gastrocnemius muscle tendinous junction  was then made, dissection was carried out through skin and hemostasis was  obtained.  Fascia was opened in line with the skin incision, conjoin region  was then developed between the gastroc and psoas.  Soft tissue was then  elevated off the posterior aspect of the gastrocnemius tendon. Sural nerve  was identified, protected posteriorly. Gastrocnemius then was released with  the Mayo scissors. This had excellent release of tight gastroc. The wound  was copiously irrigated with normal saline, subcutaneous closed with 3-0  Vicryl, skin was closed with 4-0 Monocryl subcuticular stitch.  Steri-Strips  were applied.   Longitudinal incision on the anteromedial aspect of the Achilles tendon was  then made, dissection was carried down through skin, hemostasis was  obtained.  Fascia was opened in line with the incision.  Achilles tendon was  then identified. This was then debrided with a scalpel.  Fascia was then  opened over the posterior aspect of the EHL tendon. This was verified this  was the EHL tendon.  This was traced to its respective fibroosseous groove,  and with the ankle in plantar flexion and the great toe in plantar flexion,  the tendon was tenotomized as distal as possible. The tendon was then  pulled, and the soft tissues were then elevated off from the FHL tendon,  elevated off the posterior aspect of the tibia.   We then placed a guide wire in the calcaneal tuber. This was then drilled  with a cannulated drill to 7 mm, at a depth of about 30 mm. This was then  removed.  A 23 mm x 7 mm Ar[employer]c tenodesis screw was  then chosen.  Then #2  fiber wire was then placed into the stump of the FHL tendon, looped with  application system applied to the tendon, placed in the hole and screw was  then tightened. This was very smooth and an excellent repair. The #2 fiber  wires were then sewn together and the FHL was then sewn to the Achilles  tendon using #2 fiber wire and the muscle belly ______as sewn to the  Achilles tendon using 3-0 PDS. This had excellent repair as well.  The area  was copiously irrigated with normal saline.  Tourniquet was deflated,  hemostasis was obtained. There was no pulsatile bleeding. Subcutaneous was  closed with 3-0 Vicryl, skin was closed with 4-0 nylon. Sterile dressing was  applied, modified Jones dressing was applied with the ankle in gravity  equinus.  Patient was stable to PR.      PB/MEDQ  D:  10/09/2004  T:  10/09/2004  Job:  [id number]

## 2011-02-07 NOTE — Op Note (Signed)
   NAME:  Eric Gordon, Eric Gordon                     ACCOUNT NO.:  [id number]   MEDICAL RECORD NO.:  [id number]                   PATIENT TYPE:  AMB   LOCATION:  ENDO                                 FACILITY:  [facility]   PHYSICIAN:  John C. Madilyn Fireman, M.D.                 DATE OF BIRTH:  1931/04/24   DATE OF PROCEDURE:  06/06/2003  DATE OF DISCHARGE:                                 OPERATIVE REPORT   PROCEDURE:  Colonoscopy.   INDICATIONS FOR PROCEDURE:  History of adenomatous colon polyps, his last  colonoscopy five years ago.   DESCRIPTION OF PROCEDURE:  The patient was placed in the left lateral  decubitus position then placed on the pulse monitor with continuous low flow  oxygen delivered by nasal cannula. He was sedated with 12.5 mcg IV fentanyl  and 1 mg IV Versed in addition to the medicine received for the previous  EGD. The Olympus video colonoscope was inserted into the rectum and advanced  to the cecum, confirmed by transillumination at McBurney's point and  visualization at the ileocecal valve and appendiceal orifice. The prep was  excellent. The cecum, ascending and transverse colon appeared normal with no  masses, polyps, diverticula or other mucosal abnormalities.  Within the  descending and sigmoid colon, there were numerous diverticula, no other  abnormalities. The rectum appeared normal and retroflexed view of the anus  revealed no obvious internal hemorrhoids. The scope was then withdrawn and  the patient returned to the recovery room in stable condition. The patient  tolerated the procedure well and there were no immediate complications.   IMPRESSION:  Sigmoid descending colon diverticulosis otherwise normal study.   PLAN:  Repeat colonoscopy in five years.                                               John C. Madilyn Fireman, M.D.    JCH/MEDQ  D:  06/06/2003  T:  06/06/2003  Job:  [id number]   cc:   Gwen Pounds, M.D.  [address]  [place]  [place] [address]  Fax:  [phone]

## 2011-02-07 NOTE — Op Note (Signed)
[place]. [facility]  Patient:    JOURDAIN, GUAY Visit Number: [id number] MRN: [id number]          Service Type: DSU Location: 5000 5021 01 Attending Physician:  Lubertha South Dictated by:   Kerrin Champagne, M.D. Proc. Date: 04/04/02 Admit Date:  04/04/2002                             Operative Report  PREOPERATIVE DIAGNOSIS:  Right shoulder rotator cuff tear.  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  Type 2 acromion process.  POSTOPERATIVE DIAGNOSES:   Right shoulder rotator cuff tear.  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  Type 2 acromion process.  PROCEDURES: 1. Right shoulder rotator cuff repair. 2. Suture to trough, right distal clavicle resection. 3. Right shoulder acromioplasty.  SURGEON:  Kerrin Champagne, M.D.  ASSISTANT:  Ottie Glazier. Wynona Neat, P.A.-C.  ANESTHESIA:  GOT.  Cliffton Asters Crews, M.D.  ESTIMATED BLOOD LOSS:  100 cc.  DRAINS:  None.  BRIEF CLINICAL HISTORY:  The patient is a 77 year old male who has had a history of increasing right shoulder pain, unresponsive to physical therapy and serial injection.  The patient underwent an MRI study, which demonstrated a right shoulder complete rotator cuff tear, with type 2 acromion process and degenerative changes of his right AC joint.  These also were seen on plain radiographs in terms of the acromion process and degenerative disease of the joint.  The patient has been treated for nearly three months without resolution of his pain.  He wishes to be able to perform strong overhead lifting, overhead use of his arm.  Therefore he is brought to the operating room to undergo right distal clavicle resection for [facility] arthroses, with acromioplasty and repair of rotator cuff tear.  INTRAOPERATIVE FINDINGS:  The patient was found to have a  nearly 80-90% tear of the supraspinatus tendon attachment to the greater tuberosity.  He had partial tear of the infraspinatus posteriorly.  Significant impingement due to a type 2 acromion process and severe arthrosis change in the right distal clavicle and AC joint.  Repair was performed suture in trough.  Remaining tendon attachment to the greater tuberosity was reattached over the top of the repair an and embrocation or pants-over-vest type of repair.  DESCRIPTION OF PROCEDURE:  After adequate general anesthesia, the patient in a beach chair position using the Schlein shoulder frame.  The right upper extremity was prepped from the mid forearm level to the right shoulder and lower neck area over the scapula posteriorly, the axillary area inferiorly, and anteriorly over the pectoralis region.  Using Duraprep solution, a standard preoperative antibiotics, draped in the usual manner.  The iodine VyDrape was used.  The incision over the superior aspect of the acromion pullout process and over the distal portion of the clavicle and AC joint, through the skin and subcutaneous layers, directly down to the joint superiorly.  This was then carried in line with the lateral one-third raffae of the deltoid.  The skin and subcutaneous layers were then incised.  The periosteum overlying the acromion process then elevated, both anteriorly and posteriorly; preserving a cuff for reattachment.  Resected the deltoid off of the anterior aspect of the acromion process to reveal a very large spur here. The [place] joint  was then also exposed both anteriorly and posteriorly.  Then the distal portion of the clavicle exposed over its distal 2 cm.  A Senn retractor was then passed circumferentially about the distal clavicle, both over the anterior aspect of the clavicle and posteriorly.  Oscillating saw was then used to divide the distal clavicle approximately 1.5 cm proximal to its AC joint.  This was then divided  and periosteum then resected about the joint to remove this portion of bone.  Elevation of the arm demonstrated that the [facility] joint was then completely decompressed.  Matt Holmes rongeur was used to carefully smooth the ends of the cut distal clavicle.  Then the joint was debrided of cartilage that remained here.  Bone wax was then applied to the bleeding cancellous bone surfaces, on the end of the collar bone.  Then, the periosteum was then reapproximated with interrupted 0 Ethibond sutures, closing the area over the distal clavicle as well as the [facility] joint, and the proximal portions of the acromion process.  Next, Bennett retractor was inserted beneath the anterior aspect of the acromion process.  The oscillating saw was then used to cut a wafer of bone and the spur over the anterior aspect of the acromion.  Proceeding about 1 cm thick anteriorly and doubling it posteriorly.  A high-speed bur was then used to carefully bur the undersurface of the acromion process, thinning it to decompress the rotator cuff quite nicely.  The anterior and upper aspect of the acromion was nicely decompressed, as was the lateral aspect carefully decompressed -- also using Matt Holmes rongeur.  Bone wax was applied to the bleeding cancellous bone surfaces.  Rotator cuff was then approximated.  The incision was extended distally in the anterior one-third raffae; approximately 3-4 cm being the 5 cm limit of dissection here.  The rotator cuff tear was nicely seen.  It showed the presence of frayed supraspinatus tendon at its attempt to attaching to the greater tuberosity.  This area was then debrided, incised with a #10 blade scalpel -- excising the macerated and degenerative tendon that was present (almost a triangular type fashion).  The portion of tendon that remained attached to greater tuberosity was left intact, to later suture down over the repair.  A trough of bone was then made in the area of the interval between the  lateral aspect of the patients condylar surface of his humerus; just medial to the greater tuberosity.  A trough was then made approximately 2 cm  in length.  The cut ends of the supraspinatus tendon were then carefully sutured using a modified Kessler-type stitch.  Two sutures were passed.  Three holes were then placed in the greater tuberosity, entering the trough and using the awl for the concept to the tray.  Then a Lewett clamp was used to complete the bridge between the lateral aspect of the greater tuberosity and the trough made.  The cut ends of the suture placed through the supraspinatus were then passed through these holes.  The one suture anteriorly, two through the center hole and one through the posterior hole.  The shoulder was then abducted and each of these sutures were then tied nicely, pulling the supraspinatus into the trough provided.  With this, the shoulder could be returned to the side without undue tension on the repair.  Additional 0 Ethibond sutures were used to tack down the tendinous attachment that had occurred, that was still remaining to the greater tuberosity over the repair site superolaterally.  With  this in, irrigation was performed.  Elevation of the arm and abduction, external rotation demonstrated no sign of further impingement.  The rotator cuff tear appeared to be repaired nicely, without undue tension.  At this point the remaining portion of the incision was closed.  Attempt was made to use Marcaine-type pump; however, the pump provided was one that I was not familiar with and did not feel comfortable using.  Therefore, the wound was then closed after infiltration locally with Marcaine 0.5% with 1:200,000 epinephrine.  The tendinous portion of the deltoid was approximated to the periosteum and thick fibrous tissue over the superior aspect of the acromion process using 0 Ethibond sutures.  The superficial fascia layer of the deltoid approximated  with interrupted 0 Vicryl sutures.  The deep subcutaneous layers were approximated with interrupted 2-0 Vicryl sutures, and the skin closed with stainless steel staples.  A dressing of Xeroform gauze, 4 x 4s ABDOMEN pad, and then affixed to the skin with Hypafix tape.  An ABD-pad was placed on the axilla.  The patient was placed into a shoulder immobilizer.  The patient was then reactivated and returned to the recovery room in satisfactory condition.  All instrument, sponge and needle counts were correct. Dictated by:   Kerrin Champagne, M.D. Attending Physician:  Lubertha South DD:  04/04/02 TD:  04/05/02 Job: [id number]

## 2011-02-07 NOTE — Discharge Summary (Signed)
[place]. [facility]  Patient:    Eric Gordon, Eric Gordon Visit Number: [id number] MRN: [id number]          Service Type: MED Location: 3700 3741 01 Attending Physician:  Colon Branch Dictated by:   Joellyn Rued, P.A.C. Admit Date:  06/07/2001 Discharge Date: 06/08/2001   CC:         Eric Gordon, M.D. [facility]  Jonelle Sports. Cheryll Cockayne, M.D.  Ulyess Mort, M.D. [facility]  Kerrin Champagne, M.D.   Discharge Summary  DATE OF BIRTH:  1931/09/21  SUMMARY OF HISTORY:  Eric Gordon is a 77 year old white male, who was referred to [facility] by his old primary M.D. group (Eric Gordon) secondary to chest discomfort.  Eric Gordon described a sharp anterior left-sided chest discomfort radiating to the left axilla and arm, burning sensation.  He did not have any associated nausea or vomiting, shortness of breath, diaphoresis, gas, waterbrash, recent injuries, or falls. He stated that since Friday, the discomfort has waxed and waned between 4-6 on a scale of 0/10 and has never been a 0.  He did not know of any alleviating or aggravating factors.  He denied any change with movement, pleuritic component, eating, or with exertion.  He stayed in bed all weekend and has just felt tired.  He denied any prior occurrences or relief from the pain pill that he took.  Prior to this, he denied any exertional limitations, chest discomfort, shortness of breath, orthopnea, PND, palpitations, or syncope.  He exercises five days a week at the [facility] without difficulty.  He called his physician who told him to go see the cardiologist and called our office and was ultimately referred here.  His history is notable for hyperlipidemia which was diagnosed three months ago.  He has not had a repeat LFT check or lipid function studies since being started on Lipitor.  He also has a history of remote peptic ulcer disease with prior endoscopy and colonoscopy with  benign polyps by Eric Gordon.  Chronic back problems and received a steroid shot approximately two weeks ago, diagnosed with sleep apnea in the 80s; however, he does not use any machines or assault treatment.  LABORATORY DATA:  Admission H&H was 17.0 and 50.0.  Normal indices.  Platelets 187, WBC 7.6.  Sodium 141, potassium 4.3, BUN 23, creatinine 1.1, normal LFTs. Amylase was 65.  Lipase was low at 19.  CKs and troponins were negative x 3. Fasting lipids showed a total cholesterol of 147, triglycerides 76, HDL 45, and LDL of 87.  TSH was 3.735.  Chest x-ray showed linear atelectasis in the right base without abnormality.  Chest CT did not show any evidence of pulmonary embolism nor was there any evidence of DVT in his lower extremities.  HOSPITAL COURSE:  Eric Gordon was admitted to 3700 overnight, and labs and EKGs were negative for myocardial infarction.  Stress Cardiolite performed on September 17 was performed using the Bruce protocol.  The patient ambulated without difficulty well into stage 3, achieving a heart rate of 149, well above 85% predicted heart rate.  He did have a hypertensive response with a maximum blood pressure of 209/90.  He stated that his chest discomfort did not increase.  The test was discontinued because he stated he was tired and hungry.  During the test, he had an occasional PAC but no ischemic changes. Final imaging showed an EF of 59% without any signs of wall motion abnormalities  or ischemia.  Findings were discussed with Eric Gordon, and it was felt that he could be discharged home.  DISCHARGE DIAGNOSES: 1. Prolonged atypical chest discomfort of unknown etiology. 2. Hyperlipidemia, seems to be controlled on Lipitor.  DISPOSITION:  He is discharged home.  He is asked to continue his preadmission medications.  These include Lipitor 10 mg q.h.s.; Prilosec 20 mg q.d.; Celebrex 200 mg b.i.d.; baby aspirin 81 mg q.d.  He is asked to maintain a low  salt, low fat, low cholesterol diet.  He is being established with Eric Gordon group for a primary M.D.  He was asked to continue to keep this appointment next week.  He was also to consider GI follow-up with Eric Gordon in regards to his chest discomfort if this persists. Dictated by:   Joellyn Rued, P.A.C. Attending Physician:  Colon Branch DD:  06/08/01 TD:  06/08/01 Job: [id number]

## 2011-02-07 NOTE — Op Note (Signed)
   NAME:  Eric Gordon, Eric Gordon                     ACCOUNT NO.:  [id number]   MEDICAL RECORD NO.:  [id number]                   PATIENT TYPE:  AMB   LOCATION:  ENDO                                 FACILITY:  [facility]   PHYSICIAN:  John C. Madilyn Fireman, M.D.                 DATE OF BIRTH:  02-21-1931   DATE OF PROCEDURE:  06/06/2003  DATE OF DISCHARGE:                                 OPERATIVE REPORT   PROCEDURE:  Esophagogastroduodenoscopy with biopsies.   INDICATION FOR PROCEDURE:  Reported history of Barrett's esophagus and the  patient also undergoing colonoscopy today due to history of colon polyps.   DESCRIPTION OF PROCEDURE:  The patient was placed in the left lateral  decubitus position and placed on the pulse monitor with continuous low-flow  oxygen delivered by nasal cannula.  He was sedated with 62.5 mcg of IV  fentanyl and 5 mg of IV Versed.  The Olympus video endoscope was advanced  under direct vision into the oropharynx and the esophagus was slightly  tortuous but of normal caliber.  The squamocolumnar line was located at  approximately 39 cm from the incisors.  The true LES appeared to be at about  42 cm above a 4-cm hiatal hernia with the hiatus estimated to be about 46  cm.  There was no visible ring, stricture or esophagitis.  The stomach was  entered and a small amount of liquid secretions was suctioned from the  fundus.  A retroflex view of the cardia was unremarkable.  The fundus, body,  antrum, and pylorus all appeared normal.  The duodenum was not entered and  both the bulb and second portion were well inspected and appeared to be  within normal limits.  The scope was then withdrawn back into the esophagus  and biopsies were taken from what was estimated to be about a 3-cm Barrett's  segment.  The scope was then withdrawn and the patient returned to the  recovery room in stable condition.  He tolerated the procedure well and  there were no immediate complications.   IMPRESSION:  Short segment Barrett's esophagus.   PLAN:  Await biopsy results and unless dysplasia present, probably repeat  EGD in five years.                                               John C. Madilyn Fireman, M.D.    JCH/MEDQ  D:  06/06/2003  T:  06/06/2003  Job:  [id number]   cc:   Gwen Pounds, M.D.  [address]  [place]  [place] [address]  Fax: [phone]

## 2011-02-07 NOTE — H&P (Signed)
[place]. [facility]  Patient:    Eric Gordon, Eric Gordon Visit Number: [id number] MRN: [id number]          Service Type: DSU Location: 5000 5021 01 Attending Physician:  Lubertha South Dictated by:   Ralene Bathe, P.A. Admit Date:  04/04/2002 Discharge Date: 04/06/2002                           History and Physical  DATE OF BIRTH:  18-Nov-1930  CHIEF COMPLAINT:  Right shoulder pain.  HISTORY OF PRESENT ILLNESS:  Eric Gordon is a 77 year old male right-hand dominant who has had chronic progressive right shoulder pain.  It has been especially worsened over the past few months.  He has had shoulder injections without significant improvement and was sent for an MRI which demonstrated an [facility] arthrosis as well as a full thickness partially retracted supraspinatus tear and a high grade tear of the infraspinatus.  He has positive pain on overhead movement, positive impingement signs and rotator cuff weakness demonstrated on examination.  At this time risks and benefits discussed with patient and surgical intervention is indicated and he wishes to proceed.  PAST MEDICAL HISTORY: 1. Hypertension. 2. GERD.  PAST SURGICAL HISTORY: 1. Left shoulder surgery in 1940s or early 1950s for a separated AC joint. 2. Inguinal hernia repair.  ALLERGIES:  No known drug allergies.  MEDICATIONS: 1. Celebrex 200 mg q.d. 2. Lotensin 20 mg q.d. 3. Prilosec 20 mg q.d. 4. Aspirin which he stopped.  SOCIAL HISTORY:  Uses occasional alcohol.  Does not smoke.  He is right hand dominant and is married and has assistance postoperatively.  FAMILY HISTORY:  Significant for diabetes.  REVIEW OF SYSTEMS:  The patient denies any recent fevers, chills, night sweats.  No bleeding tendencies.  CNS:  No blurred vision, seizures, headaches, or paralysis.  RESPIRATORY:  No shortness of breath, productive cough, hemoptysis.  CARDIOVASCULAR:  No chest pain, angina,  orthopnea. GASTROINTESTINAL:  No nausea, vomiting, problems with constipation, no melena in stools.  GENITOURINARY:  No dysuria, hematuria.  MUSCULOSKELETAL: As pertinent to present illness.  PHYSICAL EXAMINATION  GENERAL:  Well-developed, well-nourished 77 year old male appears younger than stated age.  VITAL SIGNS:  Blood pressure 148/84, pulse 72 and regular.  HEENT:  Normocephalic.  Extraocular movements are intact.  NECK:  Supple.  No lymphadenopathy.  No carotid bruits appreciated on examination.  CHEST:  Clear to auscultation bilaterally.  No rales or rhonchi.  HEART:  Regular rate and rhythm.  No murmurs, gallops, rubs, heaves, or thrills.  ABDOMEN:  Positive bowel sounds, soft, nontender.  EXTREMITIES:  As in history of present illness.  He has no pitting edema in his lower extremities and positive pulses.  IMPRESSION: 1. Right rotator cuff tear. 2. Hypertension. 3. Gastroesophageal reflux disease.  PLAN:  Open rotator cuff repair and acromioplasty.  Risks, benefits discussed with patient at length and he wished to proceed. Dictated by:   Ralene Bathe, P.A. Attending Physician:  Lubertha South DD:  03/31/02 TD:  04/03/02 Job: [phone]

## 2011-02-07 NOTE — Consult Note (Signed)
[place]. [facility]  Patient:    Eric Gordon, Eric Gordon Visit Number: [id number] MRN: [id number]          Service Type: DSU Location: 5000 5021 01 Attending Physician:  Lubertha South Dictated by:   Jamison Neighbor, M.D. Proc. Date: 04/06/02 Admit Date:  04/04/2002 Discharge Date: 04/06/2002   CC:         Kerrin Champagne, M.D.   Consultation Report  CONSULTING PHYSICIAN:  Kerrin Champagne, M.D.  REASON FOR CONSULTATION:  Inability to void following shoulder surgery.  HISTORY:  This 77 year old male was admitted for 23-hour observation after a right rotator cuff repair on April 04, 2002.  The patient could not urinate postoperatively.  He had an initial postvoid residual of 1000 cc.  He subsequently voided 150 cc and a residual of 550.  Foley catheter was inserted.  The patient is on some Urecholine, but is interested in trying to get out of the hospital.  Urologic consultation was sought for long-term management.  PAST MEDICAL HISTORY:  The patients past medical history is remarkable for hypertension.  This was just recently diagnosed.  The patient also suffers from esophageal reflux disease.  CURRENT MEDICATIONS: 1. Celebrex 200 mg daily. 2. Prilosec 200 mg daily. 3. Lotensin 20 mg daily. 4. Aspirin one daily.  The patient initially stated that he had no symptoms of bladder outlet obstruction; however, he admitted to voiding four times a night and it is quite clear that he has had a decrease in the quality of his urinary stream, more daytime urgency and frequency, and somewhat of a sense of incomplete emptying.  SOCIAL HISTORY:  Unremarkable.  The patient does not use tobacco and drinks modest amounts of alcohol.  REVIEW OF SYSTEMS:  Noncontributory.  PHYSICAL EXAMINATION  GENERAL:  The patient is alert and oriented.  He is sitting quietly.  He has a large bulky dressing on the right shoulder.  RECTAL:  The patient did not undergo  rectal examination today because decision was made that he would go home with a Foley catheter.  ASSESSMENT AND PLAN:  The patient was given three options for therapy.  Option #1 would be to go home with the Foley catheter in place, start alpha blockade, and come back next week for a voiding trial.  Option #2 is to go home on alpha blockade, but do self catheterization.  The third option would be to proceed with a transurethral resection of the prostate.  This most conservative approach makes sense and we have elected to send him home with the Foley catheter.  He will be started on Flomax 0.2 mg daily and also on Cipro.  I will bring him back on Monday for a formal voiding trial.  If he does not void I will do a cystoscopic examination and can consider Urecholine as the next option.  If the patient does not void, certainly, I would give him one more voiding trial and if he still fails a transurethral resection of the prostate or other form of relief of his bladder outlet obstruction will be necessary. Dictated by:   Jamison Neighbor, M.D. Attending Physician:  Lubertha South DD:  04/06/02 TD:  04/08/02 Job: [id number]

## 2011-02-10 ENCOUNTER — Encounter (HOSPITAL_COMMUNITY): Payer: Medicare Other

## 2011-02-11 ENCOUNTER — Ambulatory Visit (INDEPENDENT_AMBULATORY_CARE_PROVIDER_SITE_OTHER): Payer: BLUE CROSS/BLUE SHIELD | Admitting: *Deleted

## 2011-02-11 DIAGNOSIS — Z7901 Long term (current) use of anticoagulants: Secondary | ICD-10-CM

## 2011-02-11 DIAGNOSIS — I2699 Other pulmonary embolism without acute cor pulmonale: Secondary | ICD-10-CM

## 2011-02-11 LAB — POCT INR: INR: 2.3

## 2011-02-12 ENCOUNTER — Encounter (HOSPITAL_COMMUNITY): Payer: Medicare Other

## 2011-02-14 ENCOUNTER — Encounter (HOSPITAL_COMMUNITY): Payer: Medicare Other

## 2011-02-14 ENCOUNTER — Other Ambulatory Visit: Payer: Self-pay | Admitting: *Deleted

## 2011-02-14 MED ORDER — ROSUVASTATIN CALCIUM 10 MG PO TABS: 10.0000 mg | ORAL_TABLET | Freq: Every day | ORAL | Status: DC

## 2011-02-17 ENCOUNTER — Encounter (HOSPITAL_COMMUNITY): Payer: Medicare Other

## 2011-02-19 ENCOUNTER — Other Ambulatory Visit: Payer: Self-pay | Admitting: Cardiothoracic Surgery

## 2011-02-19 ENCOUNTER — Encounter (HOSPITAL_COMMUNITY): Payer: Medicare Other

## 2011-02-19 DIAGNOSIS — I2510 Atherosclerotic heart disease of native coronary artery without angina pectoris: Secondary | ICD-10-CM

## 2011-02-20 ENCOUNTER — Ambulatory Visit (INDEPENDENT_AMBULATORY_CARE_PROVIDER_SITE_OTHER): Payer: Self-pay | Admitting: Cardiothoracic Surgery

## 2011-02-20 ENCOUNTER — Ambulatory Visit
Admission: RE | Admit: 2011-02-20 | Discharge: 2011-02-20 | Disposition: A | Discharge status: Home / Self Care | Payer: Medicare Other | Source: Ambulatory Visit | Attending: Cardiothoracic Surgery | Admitting: Cardiothoracic Surgery

## 2011-02-20 DIAGNOSIS — I2510 Atherosclerotic heart disease of native coronary artery without angina pectoris: Secondary | ICD-10-CM

## 2011-02-21 ENCOUNTER — Encounter (HOSPITAL_COMMUNITY): Payer: Medicare Other | Attending: Cardiology

## 2011-02-21 DIAGNOSIS — G4733 Obstructive sleep apnea (adult) (pediatric): Secondary | ICD-10-CM | POA: Insufficient documentation

## 2011-02-21 DIAGNOSIS — I6529 Occlusion and stenosis of unspecified carotid artery: Secondary | ICD-10-CM | POA: Insufficient documentation

## 2011-02-21 DIAGNOSIS — I200 Unstable angina: Secondary | ICD-10-CM | POA: Insufficient documentation

## 2011-02-21 DIAGNOSIS — Z951 Presence of aortocoronary bypass graft: Secondary | ICD-10-CM | POA: Insufficient documentation

## 2011-02-21 DIAGNOSIS — Z7901 Long term (current) use of anticoagulants: Secondary | ICD-10-CM | POA: Insufficient documentation

## 2011-02-21 DIAGNOSIS — I2510 Atherosclerotic heart disease of native coronary artery without angina pectoris: Secondary | ICD-10-CM | POA: Insufficient documentation

## 2011-02-21 DIAGNOSIS — E785 Hyperlipidemia, unspecified: Secondary | ICD-10-CM | POA: Insufficient documentation

## 2011-02-21 DIAGNOSIS — I2699 Other pulmonary embolism without acute cor pulmonale: Secondary | ICD-10-CM | POA: Insufficient documentation

## 2011-02-21 DIAGNOSIS — I519 Heart disease, unspecified: Secondary | ICD-10-CM | POA: Insufficient documentation

## 2011-02-21 DIAGNOSIS — R5381 Other malaise: Secondary | ICD-10-CM | POA: Insufficient documentation

## 2011-02-21 DIAGNOSIS — Z7982 Long term (current) use of aspirin: Secondary | ICD-10-CM | POA: Insufficient documentation

## 2011-02-21 DIAGNOSIS — Z8673 Personal history of transient ischemic attack (TIA), and cerebral infarction without residual deficits: Secondary | ICD-10-CM | POA: Insufficient documentation

## 2011-02-21 DIAGNOSIS — K219 Gastro-esophageal reflux disease without esophagitis: Secondary | ICD-10-CM | POA: Insufficient documentation

## 2011-02-21 DIAGNOSIS — Z5189 Encounter for other specified aftercare: Secondary | ICD-10-CM | POA: Insufficient documentation

## 2011-02-21 DIAGNOSIS — I10 Essential (primary) hypertension: Secondary | ICD-10-CM | POA: Insufficient documentation

## 2011-02-21 NOTE — Assessment & Plan Note (Signed)
OFFICE VISIT  Eric Gordon, Eric Gordon DOB:  07-Oct-1930                                        Feb 20, 2011 CHART #:  [id number]  The patient returns to the office today in followup after his coronary artery bypass grafting x5 on December 11, 2010.  His postoperative course after discharge was complicated by a pulmonary embolus, which was found be small in the left upper lobe he has been on Coumadin since.  He is now involved in the cardiac rehab program and pressure and feeling much better, notes his shortness of breath is significantly improved from the scans it is unclear whether his shortness of breath was ever related to the pulmonary embolus or not.  However, he is now not using his oxygen at home and will discontinue this.  On exam, his blood pressure 140/70, pulse is 60, respiratory rate 16, O2 sats 95% on room air.  Sternum is stable and well healed.  His lungs are clear.  Cardiac exam reveals regular rate and rhythm without murmur or gallop.  Abdominal exam is benign without palpable masses or tenderness. He has no calf swelling or tenderness.  I have encouraged, he is very pleased with the proceeding in the cardiac rehab program.  I have not made him return appointment to see me.  He continues to be seen by  Dr. Daleen Squibb, Dr. Craige Cotta and Dr. Timothy Lasso.  He will discuss with Dr. Daleen Squibb when to discontinue his amiodarone and Dr. Craige Cotta when to discontinue his Coumadin statred for pulmonary embolus.  The patient  was also found to have an asymptomatic 68% left carotid artery stenosis which he was not formally seen by Vascular Surgery, this will need to be followed up in the future.  Sheliah Plane, MD Electronically Signed  EG/MEDQ  D:  02/20/2011  T:  02/21/2011  Job:  [id number]  cc:   Thomas C. Daleen Squibb, MD, [facility] Coralyn Helling, MD Gwen Pounds, MD

## 2011-02-24 ENCOUNTER — Encounter (HOSPITAL_COMMUNITY): Payer: Medicare Other

## 2011-02-26 ENCOUNTER — Encounter (HOSPITAL_COMMUNITY): Payer: Medicare Other

## 2011-02-27 ENCOUNTER — Encounter: Payer: Self-pay | Admitting: Cardiology

## 2011-02-28 ENCOUNTER — Encounter (HOSPITAL_COMMUNITY): Payer: Medicare Other

## 2011-03-03 ENCOUNTER — Encounter (HOSPITAL_COMMUNITY): Payer: Medicare Other

## 2011-03-04 ENCOUNTER — Ambulatory Visit (INDEPENDENT_AMBULATORY_CARE_PROVIDER_SITE_OTHER): Payer: Medicare Other | Admitting: *Deleted

## 2011-03-04 DIAGNOSIS — Z7901 Long term (current) use of anticoagulants: Secondary | ICD-10-CM

## 2011-03-04 DIAGNOSIS — I2699 Other pulmonary embolism without acute cor pulmonale: Secondary | ICD-10-CM

## 2011-03-04 LAB — POCT INR: INR: 2.5

## 2011-03-05 ENCOUNTER — Encounter (HOSPITAL_COMMUNITY): Payer: Medicare Other

## 2011-03-07 ENCOUNTER — Encounter (HOSPITAL_COMMUNITY): Payer: Medicare Other

## 2011-03-10 ENCOUNTER — Encounter (HOSPITAL_COMMUNITY): Payer: Medicare Other

## 2011-03-12 ENCOUNTER — Encounter (HOSPITAL_COMMUNITY): Payer: Medicare Other

## 2011-03-14 ENCOUNTER — Encounter (HOSPITAL_COMMUNITY): Payer: Medicare Other

## 2011-03-17 ENCOUNTER — Encounter (HOSPITAL_COMMUNITY): Payer: Medicare Other

## 2011-03-19 ENCOUNTER — Encounter (HOSPITAL_COMMUNITY): Payer: Medicare Other

## 2011-03-21 ENCOUNTER — Encounter (HOSPITAL_COMMUNITY): Payer: Medicare Other

## 2011-03-24 ENCOUNTER — Encounter (HOSPITAL_COMMUNITY): Payer: Medicare Other | Attending: Cardiology

## 2011-03-24 DIAGNOSIS — G4733 Obstructive sleep apnea (adult) (pediatric): Secondary | ICD-10-CM | POA: Insufficient documentation

## 2011-03-24 DIAGNOSIS — I519 Heart disease, unspecified: Secondary | ICD-10-CM | POA: Insufficient documentation

## 2011-03-24 DIAGNOSIS — R5381 Other malaise: Secondary | ICD-10-CM | POA: Insufficient documentation

## 2011-03-24 DIAGNOSIS — Z7982 Long term (current) use of aspirin: Secondary | ICD-10-CM | POA: Insufficient documentation

## 2011-03-24 DIAGNOSIS — Z8673 Personal history of transient ischemic attack (TIA), and cerebral infarction without residual deficits: Secondary | ICD-10-CM | POA: Insufficient documentation

## 2011-03-24 DIAGNOSIS — I6529 Occlusion and stenosis of unspecified carotid artery: Secondary | ICD-10-CM | POA: Insufficient documentation

## 2011-03-24 DIAGNOSIS — Z7901 Long term (current) use of anticoagulants: Secondary | ICD-10-CM | POA: Insufficient documentation

## 2011-03-24 DIAGNOSIS — E785 Hyperlipidemia, unspecified: Secondary | ICD-10-CM | POA: Insufficient documentation

## 2011-03-24 DIAGNOSIS — Z951 Presence of aortocoronary bypass graft: Secondary | ICD-10-CM | POA: Insufficient documentation

## 2011-03-24 DIAGNOSIS — I10 Essential (primary) hypertension: Secondary | ICD-10-CM | POA: Insufficient documentation

## 2011-03-24 DIAGNOSIS — K219 Gastro-esophageal reflux disease without esophagitis: Secondary | ICD-10-CM | POA: Insufficient documentation

## 2011-03-24 DIAGNOSIS — Z5189 Encounter for other specified aftercare: Secondary | ICD-10-CM | POA: Insufficient documentation

## 2011-03-24 DIAGNOSIS — I2510 Atherosclerotic heart disease of native coronary artery without angina pectoris: Secondary | ICD-10-CM | POA: Insufficient documentation

## 2011-03-24 DIAGNOSIS — I200 Unstable angina: Secondary | ICD-10-CM | POA: Insufficient documentation

## 2011-03-24 DIAGNOSIS — I2699 Other pulmonary embolism without acute cor pulmonale: Secondary | ICD-10-CM | POA: Insufficient documentation

## 2011-03-26 ENCOUNTER — Encounter (HOSPITAL_COMMUNITY): Payer: Medicare Other

## 2011-03-28 ENCOUNTER — Encounter (HOSPITAL_COMMUNITY): Payer: Medicare Other

## 2011-03-31 ENCOUNTER — Encounter (HOSPITAL_COMMUNITY): Payer: Medicare Other

## 2011-04-01 ENCOUNTER — Encounter: Payer: Medicare Other | Admitting: *Deleted

## 2011-04-02 ENCOUNTER — Encounter: Payer: Self-pay | Admitting: Cardiology

## 2011-04-02 ENCOUNTER — Ambulatory Visit (INDEPENDENT_AMBULATORY_CARE_PROVIDER_SITE_OTHER): Payer: Medicare Other | Admitting: Cardiology

## 2011-04-02 ENCOUNTER — Encounter (HOSPITAL_COMMUNITY): Payer: Medicare Other

## 2011-04-02 ENCOUNTER — Ambulatory Visit (INDEPENDENT_AMBULATORY_CARE_PROVIDER_SITE_OTHER): Payer: Medicare Other | Admitting: *Deleted

## 2011-04-02 VITALS — BP 110/64 | HR 53 | Ht 72.0 in | Wt 216.0 lb

## 2011-04-02 DIAGNOSIS — I480 Paroxysmal atrial fibrillation: Secondary | ICD-10-CM | POA: Insufficient documentation

## 2011-04-02 DIAGNOSIS — I2510 Atherosclerotic heart disease of native coronary artery without angina pectoris: Secondary | ICD-10-CM

## 2011-04-02 DIAGNOSIS — Z7901 Long term (current) use of anticoagulants: Secondary | ICD-10-CM

## 2011-04-02 DIAGNOSIS — I2699 Other pulmonary embolism without acute cor pulmonale: Secondary | ICD-10-CM

## 2011-04-02 DIAGNOSIS — I6529 Occlusion and stenosis of unspecified carotid artery: Secondary | ICD-10-CM

## 2011-04-02 DIAGNOSIS — I4891 Unspecified atrial fibrillation: Secondary | ICD-10-CM

## 2011-04-02 DIAGNOSIS — I714 Abdominal aortic aneurysm, without rupture: Secondary | ICD-10-CM

## 2011-04-02 DIAGNOSIS — I4819 Other persistent atrial fibrillation: Secondary | ICD-10-CM | POA: Insufficient documentation

## 2011-04-02 LAB — POCT INR: INR: 3

## 2011-04-02 MED ORDER — OMEPRAZOLE 20 MG PO CPDR: 20.0000 mg | DELAYED_RELEASE_CAPSULE | Freq: Every day | ORAL | Status: DC

## 2011-04-02 MED ORDER — METOPROLOL TARTRATE 25 MG PO TABS: 25.0000 mg | ORAL_TABLET | Freq: Two times a day (BID) | ORAL | Status: DC

## 2011-04-02 MED ORDER — TAMSULOSIN HCL 0.4 MG PO CAPS: 0.4000 mg | ORAL_CAPSULE | Freq: Every day | ORAL | Status: DC

## 2011-04-02 MED ORDER — ROSUVASTATIN CALCIUM 10 MG PO TABS: 10.0000 mg | ORAL_TABLET | Freq: Every day | ORAL | Status: DC

## 2011-04-02 NOTE — Assessment & Plan Note (Signed)
followup ultrasound in one year.

## 2011-04-02 NOTE — Progress Notes (Signed)
HPI Mr. Eric Gordon returns today for evaluation and management of his coronary artery disease, history of coronary bypass grafting in April, postoperative A. Fib on amiodarone, and a postop pulmonary embolus still on Coumadin.   He is released by Dr. Tyrone Sage of surgery. Dr. Tyrone Sage also noted a asymptomatic 68% left carotid artery stenosis.  He has no significant complaints in his dyspnea which was his original presentation has markedly improved. He has had 2 episodes of some shortness of breath, one while working in the heat in the woods burning stuff. He required use of his inhaler. He had one other episode where he got dizzy and slightly winded. It was brief.  He denies any hemoptysis, orthopnea, PND, or edema. He is been in cardiac rehabilitation. He was prediabetic but has lost 31 pounds. His hemoglobin A1c was 5.8.  His last echocardiogram showed ejection fraction of 50% with mild right ventricular and right atrial enlargement. Good mild aortic valve stenosis. There was septal dyssynergy from surgery. This was performed December 23, 2010.  He denies any palpitations or rapid heartbeat. He said no syncope or presyncope.  EKG today shows sinus bradycardia with a left anterior fascicular block. Past Medical History  Diagnosis Date  . Hypertension   . S/P CABG (coronary artery bypass graft)   . GERD (gastroesophageal reflux disease)   . Pulmonary embolism     April 2012 after CABG  . Sleep apnea   . Asthma   . CAD (coronary artery disease)     Past Surgical History  Procedure Date  . Rotator cuff repair   . Penile prosthesis implant   . Knee surgery   . Inguinal hernia repair     Family History  Problem Relation Age of Onset  . Coronary artery disease Mother   . Hypertension Mother     History   Social History  . Marital Status: Married    Spouse Name: N/A    Number of Children: N/A  . Years of Education: N/A   Occupational History  . Sales    Social History Main  Topics  . Smoking status: Former Smoker -- 1.0 packs/day for 35 years    Types: Cigarettes    Quit date: 09/23/1983  . Smokeless tobacco: Not on file  . Alcohol Use: Not on file  . Drug Use: Not on file  . Sexually Active: Not on file   Other Topics Concern  . Not on file   Social History Narrative  . No narrative on file    Allergies  Allergen Reactions  . Ancef (Cefazolin Sodium)   . Vancomycin     Current Outpatient Prescriptions  Medication Sig Dispense Refill  . amiodarone (PACERONE) 200 MG tablet Once a day       . Ascorbic Acid (VITAMIN C) 500 MG tablet Take 500 mg by mouth daily.        Marland Kitchen aspirin 81 MG tablet Take 81 mg by mouth daily.        . budesonide-formoterol (SYMBICORT) 160-4.5 MCG/ACT inhaler Inhale 2 puffs into the lungs 2 (two) times daily.        . cetirizine (ZYRTEC) 10 MG tablet Take 10 mg by mouth daily.        . cholecalciferol (VITAMIN D) 1000 UNITS tablet Take 1,000 Units by mouth daily.        Marland Kitchen guaiFENesin (MUCINEX) 600 MG 12 hr tablet Once a day in the am       . metoprolol tartrate (LOPRESSOR) 25  MG tablet 1/2 tablet twice a day       . omeprazole (PRILOSEC) 20 MG capsule Take 20 mg by mouth daily.        . rosuvastatin (CRESTOR) 10 MG tablet Take 1 tablet (10 mg total) by mouth daily. Once a day  30 tablet  6  . Tamsulosin HCl (FLOMAX) 0.4 MG CAPS Take 0.4 mg by mouth daily.        Marland Kitchen warfarin (COUMADIN) 5 MG tablet As directed         ROS Negative other than HPI.   PE General Appearance: well developed, well nourished in no acute distress HEENT: symmetrical face, PERRLA, good dentition  Neck: no JVD, thyromegaly, or adenopathy, trachea midline Chest: symmetric without deformity Cardiac: PMI non-displaced, RRR, normal S1, S2, no gallop, soft systolic murmur left sternal border Lung: clear to ausculation and percussion Vascular: all pulses full without bruits  Abdominal: nondistended, nontender, good bowel sounds, no HSM, no  bruits Extremities: no cyanosis, clubbing or edema, no sign of DVT, no varicosities  Skin: normal color, no rashes Neuro: alert and oriented x 3, non-focal Pysch: normal affect  Filed Vitals:   04/02/11 0857  BP: 110/64  Pulse: 53  Height: 6' (1.829 m)  Weight: 216 lb (97.977 kg)    EKG  Labs and Studies Reviewed.   Lab Results  Component Value Date   WBC 14.3* 12/26/2010   HGB 11.7* 12/26/2010   HCT 36.3* 12/26/2010   MCV 94.0 12/26/2010   PLT 409* 12/26/2010      Chemistry      Component Value Date/Time   NA 137 12/24/2010 0953   K 4.6 12/24/2010 0953   CL 99 12/24/2010 0953   CO2 26 12/24/2010 0953   BUN 16 12/24/2010 0953   CREATININE 1.27 12/24/2010 0953      Component Value Date/Time   CALCIUM 9.4 12/24/2010 0953   ALKPHOS 58 12/06/2010 0625   AST 21 12/06/2010 0625   ALT 18 12/06/2010 0625   BILITOT 0.8 12/06/2010 0625       No results found for this basename: CHOL   No results found for this basename: HDL   No results found for this basename: LDLCALC   No results found for this basename: TRIG   No results found for this basename: CHOLHDL   Lab Results  Component Value Date   HGBA1C  Value: 5.8 (NOTE)                                                                       According to the ADA Clinical Practice Recommendations for 2011, when HbA1c is used as a screening test:   >=6.5%   Diagnostic of Diabetes Mellitus           (if abnormal result  is confirmed)  5.7-6.4%   Increased risk of developing Diabetes Mellitus  References:Diagnosis and Classification of Diabetes Mellitus,Diabetes Care,2011,34(Suppl 1):S62-S69 and Standards of Medical Care in         Diabetes - 2011,Diabetes Care,2011,34  (Suppl 1):S11-S61.* 12/06/2010   Lab Results  Component Value Date   ALT 18 12/06/2010   AST 21 12/06/2010   ALKPHOS 58 12/06/2010   BILITOT 0.8 12/06/2010   No results found  for this basename: TSH

## 2011-04-02 NOTE — Assessment & Plan Note (Signed)
He is status post bypass surgery. He is doing well. No change in meds.

## 2011-04-02 NOTE — Assessment & Plan Note (Signed)
Continue Coumadin for 3 more months.

## 2011-04-02 NOTE — Assessment & Plan Note (Signed)
follow-up in one year.

## 2011-04-02 NOTE — Assessment & Plan Note (Signed)
He is now in sinus rhythm and holding. We'll discontinue the amiodarone today. We'll continue Coumadin because of his pulmonary embolus for 3 more months. I will see him at that time.

## 2011-04-02 NOTE — Patient Instructions (Addendum)
Your physician recommends that you schedule a follow-up appointment in:3 months with Dr. Daleen Squibb Your physician has recommended you make the following change in your medication:  STOP Amiodarone Continue Coumadin for 3 more months

## 2011-04-04 ENCOUNTER — Encounter (HOSPITAL_COMMUNITY): Payer: Medicare Other

## 2011-04-07 ENCOUNTER — Encounter (HOSPITAL_COMMUNITY): Payer: Medicare Other

## 2011-04-09 ENCOUNTER — Encounter (HOSPITAL_COMMUNITY): Payer: Medicare Other

## 2011-04-11 ENCOUNTER — Encounter (HOSPITAL_COMMUNITY): Payer: Medicare Other

## 2011-04-14 ENCOUNTER — Encounter (HOSPITAL_COMMUNITY): Payer: Medicare Other

## 2011-04-16 ENCOUNTER — Encounter (HOSPITAL_COMMUNITY): Payer: Medicare Other

## 2011-04-18 ENCOUNTER — Encounter (HOSPITAL_COMMUNITY): Payer: Medicare Other

## 2011-04-21 ENCOUNTER — Encounter (HOSPITAL_COMMUNITY): Payer: Medicare Other

## 2011-04-23 ENCOUNTER — Encounter (HOSPITAL_COMMUNITY): Payer: Medicare Other | Attending: Cardiology

## 2011-04-23 DIAGNOSIS — Z5189 Encounter for other specified aftercare: Secondary | ICD-10-CM | POA: Insufficient documentation

## 2011-04-23 DIAGNOSIS — G4733 Obstructive sleep apnea (adult) (pediatric): Secondary | ICD-10-CM | POA: Insufficient documentation

## 2011-04-23 DIAGNOSIS — Z8673 Personal history of transient ischemic attack (TIA), and cerebral infarction without residual deficits: Secondary | ICD-10-CM | POA: Insufficient documentation

## 2011-04-23 DIAGNOSIS — I2699 Other pulmonary embolism without acute cor pulmonale: Secondary | ICD-10-CM | POA: Insufficient documentation

## 2011-04-23 DIAGNOSIS — Z951 Presence of aortocoronary bypass graft: Secondary | ICD-10-CM | POA: Insufficient documentation

## 2011-04-23 DIAGNOSIS — I10 Essential (primary) hypertension: Secondary | ICD-10-CM | POA: Insufficient documentation

## 2011-04-23 DIAGNOSIS — E785 Hyperlipidemia, unspecified: Secondary | ICD-10-CM | POA: Insufficient documentation

## 2011-04-23 DIAGNOSIS — I200 Unstable angina: Secondary | ICD-10-CM | POA: Insufficient documentation

## 2011-04-23 DIAGNOSIS — I6529 Occlusion and stenosis of unspecified carotid artery: Secondary | ICD-10-CM | POA: Insufficient documentation

## 2011-04-23 DIAGNOSIS — I519 Heart disease, unspecified: Secondary | ICD-10-CM | POA: Insufficient documentation

## 2011-04-23 DIAGNOSIS — Z7982 Long term (current) use of aspirin: Secondary | ICD-10-CM | POA: Insufficient documentation

## 2011-04-23 DIAGNOSIS — I2510 Atherosclerotic heart disease of native coronary artery without angina pectoris: Secondary | ICD-10-CM | POA: Insufficient documentation

## 2011-04-23 DIAGNOSIS — Z7901 Long term (current) use of anticoagulants: Secondary | ICD-10-CM | POA: Insufficient documentation

## 2011-04-23 DIAGNOSIS — R5381 Other malaise: Secondary | ICD-10-CM | POA: Insufficient documentation

## 2011-04-23 DIAGNOSIS — K219 Gastro-esophageal reflux disease without esophagitis: Secondary | ICD-10-CM | POA: Insufficient documentation

## 2011-04-25 ENCOUNTER — Encounter (HOSPITAL_COMMUNITY): Payer: Medicare Other

## 2011-04-28 ENCOUNTER — Encounter (HOSPITAL_COMMUNITY): Payer: Medicare Other

## 2011-04-28 ENCOUNTER — Ambulatory Visit (INDEPENDENT_AMBULATORY_CARE_PROVIDER_SITE_OTHER): Payer: Medicare Other | Admitting: *Deleted

## 2011-04-28 DIAGNOSIS — I2699 Other pulmonary embolism without acute cor pulmonale: Secondary | ICD-10-CM

## 2011-04-28 DIAGNOSIS — Z7901 Long term (current) use of anticoagulants: Secondary | ICD-10-CM

## 2011-04-28 LAB — POCT INR: INR: 1.4

## 2011-04-30 ENCOUNTER — Encounter (HOSPITAL_COMMUNITY): Payer: Medicare Other

## 2011-05-01 ENCOUNTER — Encounter: Payer: Self-pay | Admitting: Cardiology

## 2011-05-02 ENCOUNTER — Encounter (HOSPITAL_COMMUNITY): Payer: Medicare Other

## 2011-05-12 ENCOUNTER — Ambulatory Visit (INDEPENDENT_AMBULATORY_CARE_PROVIDER_SITE_OTHER): Payer: Medicare Other | Admitting: *Deleted

## 2011-05-12 DIAGNOSIS — I2699 Other pulmonary embolism without acute cor pulmonale: Secondary | ICD-10-CM

## 2011-05-12 DIAGNOSIS — Z7901 Long term (current) use of anticoagulants: Secondary | ICD-10-CM

## 2011-05-12 LAB — POCT INR: INR: 2.3

## 2011-05-14 ENCOUNTER — Encounter: Payer: Self-pay | Admitting: Pulmonary Disease

## 2011-05-14 ENCOUNTER — Ambulatory Visit (INDEPENDENT_AMBULATORY_CARE_PROVIDER_SITE_OTHER): Payer: Medicare Other | Admitting: Pulmonary Disease

## 2011-05-14 DIAGNOSIS — I2699 Other pulmonary embolism without acute cor pulmonale: Secondary | ICD-10-CM

## 2011-05-14 DIAGNOSIS — J45909 Unspecified asthma, uncomplicated: Secondary | ICD-10-CM

## 2011-05-14 DIAGNOSIS — G4733 Obstructive sleep apnea (adult) (pediatric): Secondary | ICD-10-CM

## 2011-05-14 DIAGNOSIS — R0602 Shortness of breath: Secondary | ICD-10-CM

## 2011-05-14 MED ORDER — BUDESONIDE-FORMOTEROL FUMARATE 160-4.5 MCG/ACT IN AERO: INHALATION_SPRAY | RESPIRATORY_TRACT | Status: DC

## 2011-05-14 MED ORDER — ALBUTEROL SULFATE HFA 108 (90 BASE) MCG/ACT IN AERS: 2.0000 | INHALATION_SPRAY | Freq: Four times a day (QID) | RESPIRATORY_TRACT | Status: DC | PRN

## 2011-05-14 NOTE — Assessment & Plan Note (Signed)
He was found to have Lt upper lobe PE after recent cardiac surgery.  He is on coumadin through coumadin clinic.

## 2011-05-14 NOTE — Assessment & Plan Note (Signed)
Doing well.  Will have him decrease symbicort to one puff bid for 3 weeks, and if okay then one puff at night.

## 2011-05-14 NOTE — Assessment & Plan Note (Signed)
Improved.  He is no off supplemental oxygen.

## 2011-05-14 NOTE — Patient Instructions (Signed)
Change symbicort to one puff twice per day for 3 weeks, and if okay then one puff at night Follow up in 6 months

## 2011-05-14 NOTE — Progress Notes (Signed)
Subjective:    Patient ID: Eric Gordon, male    DOB: 12-26-1930, 77 y.o.   MRN: [id number]  HPI 77 yo male with dyspnea, asthma, CAD, PE after CABG, OSA.  He has been doing well.  He is off oxygen.  He gets occasional chest discomfort.  He is not having cough, wheeze, sputum.  He is exercising at Y w/o difficulty.  Past Medical History  Diagnosis Date  . Hypertension   . S/P CABG (coronary artery bypass graft)   . GERD (gastroesophageal reflux disease)   . Pulmonary embolism     April 2012 after CABG  . Sleep apnea   . Asthma   . CAD (coronary artery disease)      Family History  Problem Relation Age of Onset  . Coronary artery disease Mother   . Hypertension Mother      History   Social History  . Marital Status: Married    Spouse Name: N/A    Number of Children: N/A  . Years of Education: N/A   Occupational History  . Sales    Social History Main Topics  . Smoking status: Former Smoker -- 1.0 packs/day for 35 years    Types: Cigarettes    Quit date: 09/23/1983  . Smokeless tobacco: Never Used  . Alcohol Use: 1.5 oz/week    3 drink(s) per week  . Drug Use: No  . Sexually Active: Not on file   Other Topics Concern  . Not on file   Social History Narrative  . No narrative on file     Allergies  Allergen Reactions  . Ancef (Cefazolin Sodium)   . Vancomycin     Review of Systems     Objective:   Physical Exam  BP 116/72  Pulse 59  Temp(Src) 97.7 F (36.5 C) (Oral)  Ht 6' 3.5" (1.918 m)  Wt 218 lb 6.4 oz (99.066 kg)  BMI 26.94 kg/m2  SpO2 96%  General: normal appearance.  Nose: no deformity, discharge, inflammation, or lesions  Mouth: no deformity or lesions  Neck: no JVD.  Lungs: diminished breath sounds, no wheezing or rales.  Heart: regular rhythm, normal rate, and no murmurs.  Extremities: no clubbing, cyanosis, edema, or deformity noted  Neurologic: normal CN II-XII and strength normal.  Cervical Nodes: no significant  adenopathy  Psych: alert and cooperative; normal mood and affect; normal attention span and concentration     Assessment & Plan:   ASTHMA Doing well.  Will have him decrease symbicort to one puff bid for 3 weeks, and if okay then one puff at night.  OBSTRUCTIVE SLEEP APNEA CPAP per primary care.  PE (pulmonary embolism) He was found to have Lt upper lobe PE after recent cardiac surgery.  He is on coumadin through coumadin clinic.    DYSPNEA Improved.  He is no off supplemental oxygen.    Updated Medication List Outpatient Encounter Prescriptions as of 05/14/2011  Medication Sig Dispense Refill  . Ascorbic Acid (VITAMIN C) 500 MG tablet Take 500 mg by mouth daily.        Marland Kitchen aspirin 81 MG tablet Take 81 mg by mouth daily.        . budesonide-formoterol (SYMBICORT) 160-4.5 MCG/ACT inhaler One puff twice per day for 3 weeks, and then one puff at night  1 Inhaler  11  . cetirizine (ZYRTEC) 10 MG tablet Take 10 mg by mouth daily.        . cholecalciferol (VITAMIN D) 1000 UNITS tablet  Take 1,000 Units by mouth daily.        . metoprolol tartrate (LOPRESSOR) 25 MG tablet 1/2 tablet twice a day       . omeprazole (PRILOSEC) 20 MG capsule Take 1 capsule (20 mg total) by mouth daily.  90 capsule  3  . rosuvastatin (CRESTOR) 10 MG tablet Take 1 tablet (10 mg total) by mouth daily. Once a day  90 tablet  3  . Tamsulosin HCl (FLOMAX) 0.4 MG CAPS Take 1 capsule (0.4 mg total) by mouth daily.  90 capsule  3  . warfarin (COUMADIN) 5 MG tablet As directed       . DISCONTD: budesonide-formoterol (SYMBICORT) 160-4.5 MCG/ACT inhaler Inhale 2 puffs into the lungs 2 (two) times daily.        Marland Kitchen DISCONTD: metoprolol tartrate (LOPRESSOR) 25 MG tablet Take 1 tablet (25 mg total) by mouth 2 (two) times daily. 1/2 tablet twice a day  90 tablet  3  . guaiFENesin (MUCINEX) 600 MG 12 hr tablet Once a day in the am

## 2011-05-14 NOTE — Assessment & Plan Note (Signed)
CPAP per primary care.   

## 2011-06-02 ENCOUNTER — Ambulatory Visit (INDEPENDENT_AMBULATORY_CARE_PROVIDER_SITE_OTHER): Payer: Medicare Other | Admitting: *Deleted

## 2011-06-02 DIAGNOSIS — Z7901 Long term (current) use of anticoagulants: Secondary | ICD-10-CM

## 2011-06-02 DIAGNOSIS — I2699 Other pulmonary embolism without acute cor pulmonale: Secondary | ICD-10-CM

## 2011-06-02 LAB — POCT INR: INR: 1.7

## 2011-06-10 ENCOUNTER — Other Ambulatory Visit: Payer: Self-pay | Admitting: Cardiology

## 2011-06-10 MED ORDER — WARFARIN SODIUM 5 MG PO TABS: ORAL_TABLET | ORAL | Status: DC

## 2011-06-23 ENCOUNTER — Ambulatory Visit (INDEPENDENT_AMBULATORY_CARE_PROVIDER_SITE_OTHER): Payer: Medicare Other | Admitting: *Deleted

## 2011-06-23 DIAGNOSIS — Z7901 Long term (current) use of anticoagulants: Secondary | ICD-10-CM

## 2011-06-23 DIAGNOSIS — I2699 Other pulmonary embolism without acute cor pulmonale: Secondary | ICD-10-CM

## 2011-06-24 ENCOUNTER — Ambulatory Visit (INDEPENDENT_AMBULATORY_CARE_PROVIDER_SITE_OTHER)
Admission: RE | Admit: 2011-06-24 | Discharge: 2011-06-24 | Disposition: A | Discharge status: Home / Self Care | Payer: Medicare Other | Source: Ambulatory Visit | Attending: Nurse Practitioner | Admitting: Nurse Practitioner

## 2011-06-24 ENCOUNTER — Ambulatory Visit (INDEPENDENT_AMBULATORY_CARE_PROVIDER_SITE_OTHER): Payer: Medicare Other | Admitting: Nurse Practitioner

## 2011-06-24 ENCOUNTER — Telehealth: Payer: Self-pay | Admitting: Cardiology

## 2011-06-24 ENCOUNTER — Encounter: Payer: Self-pay | Admitting: Nurse Practitioner

## 2011-06-24 DIAGNOSIS — R079 Chest pain, unspecified: Secondary | ICD-10-CM

## 2011-06-24 DIAGNOSIS — R0602 Shortness of breath: Secondary | ICD-10-CM

## 2011-06-24 DIAGNOSIS — I2699 Other pulmonary embolism without acute cor pulmonale: Secondary | ICD-10-CM

## 2011-06-24 LAB — CK TOTAL AND CKMB (NOT AT ARMC)
CK, MB: 1.6 ng/mL (ref 0.3–4.0)
Total CK: 64 U/L (ref 7–232)

## 2011-06-24 LAB — BRAIN NATRIURETIC PEPTIDE: Brain Natriuretic Peptide: 81.4 pg/mL (ref 0.0–100.0)

## 2011-06-24 LAB — TROPONIN I: Troponin I: 0.01 ng/mL (ref <0.06)

## 2011-06-24 MED ORDER — IOHEXOL 300 MG/ML  SOLN
80.0000 mL | Freq: Once | INTRAMUSCULAR | Status: AC | PRN
  Administered 2011-06-24: 80 mL via INTRAVENOUS

## 2011-06-24 NOTE — Progress Notes (Signed)
Addended by: Rosalio Macadamia on: 06/24/2011 04:52 PM   Modules accepted: Orders

## 2011-06-24 NOTE — Telephone Encounter (Signed)
Pt's wife calling re pt having pain in chest and sob since yesterday off and on

## 2011-06-24 NOTE — Progress Notes (Signed)
Eric Gordon Date of Birth: 02/17/1931   History of Present Illness: Eric Gordon is seen today for a work in visit. He is seen for Dr. Daleen Squibb. He has not felt well for the past 1 week. He told his wife today and she made him come in. He had CABG x 5 back in March that was complicated by PE. He has been on coumadin but INR's have been subtherapeutic for several weeks. He now presents with dull and constant chest pain. It is worse with a deep breath. He is a little short of breath. He just "doesn't feel well". No fever but has had some sore throat. This is different from his prior chest pain syndrome. No hemoptysis.   Current Outpatient Prescriptions on File Prior to Visit  Medication Sig Dispense Refill  . albuterol (PROAIR HFA) 108 (90 BASE) MCG/ACT inhaler Inhale 2 puffs into the lungs every 6 (six) hours as needed for wheezing.  1 Inhaler  5  . Ascorbic Acid (VITAMIN C) 500 MG tablet Take 500 mg by mouth daily.        Marland Kitchen aspirin 81 MG tablet Take 81 mg by mouth daily.        . budesonide-formoterol (SYMBICORT) 160-4.5 MCG/ACT inhaler One puff twice per day for 3 weeks, and then one puff at night  1 Inhaler  11  . cetirizine (ZYRTEC) 10 MG tablet Take 10 mg by mouth daily.        . cholecalciferol (VITAMIN D) 1000 UNITS tablet Take 1,000 Units by mouth daily.        . metoprolol tartrate (LOPRESSOR) 25 MG tablet 1/2 tablet twice a day       . omeprazole (PRILOSEC) 20 MG capsule Take 1 capsule (20 mg total) by mouth daily.  90 capsule  3  . rosuvastatin (CRESTOR) 10 MG tablet Take 1 tablet (10 mg total) by mouth daily. Once a day  90 tablet  3  . Tamsulosin HCl (FLOMAX) 0.4 MG CAPS Take 1 capsule (0.4 mg total) by mouth daily.  90 capsule  3  . warfarin (COUMADIN) 5 MG tablet Take as directed by the coumadin clinic  30 tablet  3   No current facility-administered medications on file prior to visit.    Allergies  Allergen Reactions  . Ancef (Cefazolin Sodium)   . Vancomycin      Past Medical History  Diagnosis Date  . Hypertension   . S/P CABG (coronary artery bypass graft)   . GERD (gastroesophageal reflux disease)   . Pulmonary embolism     April 2012 after CABG  . Sleep apnea   . Asthma   . CAD (coronary artery disease)   . Chronic anticoagulation     on coumadin for PE    Past Surgical History  Procedure Date  . Rotator cuff repair   . Penile prosthesis implant   . Knee surgery   . Inguinal hernia repair   . Coronary artery bypass graft March 2012    x 5    History  Smoking status  . Former Smoker -- 1.0 packs/day for 35 years  . Types: Cigarettes  . Quit date: 09/23/1983  Smokeless tobacco  . Never Used    History  Alcohol Use  . 1.5 oz/week  . 3 drink(s) per week    Family History  Problem Relation Age of Onset  . Coronary artery disease Mother   . Hypertension Mother   . Heart disease Mother  Review of Systems: The review of systems is per the HPI.  All other systems were reviewed and are negative.  Physical Exam: BP 140/86  Pulse 62  Ht 6' 4.5" (1.943 m)  Wt 215 lb (97.523 kg)  BMI 25.83 kg/m2 Pulse ox was 93% on room air. Drops to 87 to 88% with ambulation in the office. Patient is very pleasant and in no acute distress but looks like he doesn't feel good. Skin is warm and dry. Color is normal.  HEENT is unremarkable. Normocephalic/atraumatic. PERRL. Sclera are nonicteric. Neck is supple. No masses. No JVD. Lungs are clear. Cardiac exam shows a regular rate and rhythm. Does have soft systolic murmur noted.  Abdomen is soft. Extremities are without edema. Gait and ROM are intact. No gross neurologic deficits noted.  LABORATORY DATA: EKG shows sinus with left anterior fasicular block. It is unchanged. It is reviewed with Dr. Graciela Husbands (DOD today).   Assessment / Plan:

## 2011-06-24 NOTE — Progress Notes (Signed)
Addended by: Alma Friendly on: 06/24/2011 04:56 PM   Modules accepted: Orders

## 2011-06-24 NOTE — Patient Instructions (Addendum)
We are going to check a CT scan of your chest to look for blood clots. We have done this today and it looks ok.  We are going to check your labs today and repeat an ultrasound of your heart.  Further disposition to follow once your other studies are complete. Call for any problems.

## 2011-06-24 NOTE — Progress Notes (Signed)
Addended by: Alma Friendly on: 06/24/2011 04:55 PM   Modules accepted: Orders

## 2011-06-24 NOTE — Assessment & Plan Note (Addendum)
His symptoms are worrisome for repeat PE. We will arrange for repeat CT scan today. Last creatinine was normal.    Addendum: CT angio is negative for PE or other pulmonary issue. I have discussed his care again with Dr. Graciela Husbands. We will check BNP and arrange for repeat echo. May need stress testing to rule out early graft failure. EKG is unchanged and was reviewed with Dr. Graciela Husbands. Further disposition to follow once his studies are complete.

## 2011-06-25 ENCOUNTER — Telehealth: Payer: Self-pay | Admitting: *Deleted

## 2011-06-25 ENCOUNTER — Telehealth: Payer: Self-pay | Admitting: Pulmonary Disease

## 2011-06-25 ENCOUNTER — Ambulatory Visit (HOSPITAL_COMMUNITY): Payer: Medicare Other | Attending: Cardiology | Admitting: Radiology

## 2011-06-25 DIAGNOSIS — R0602 Shortness of breath: Secondary | ICD-10-CM

## 2011-06-25 DIAGNOSIS — G4733 Obstructive sleep apnea (adult) (pediatric): Secondary | ICD-10-CM | POA: Insufficient documentation

## 2011-06-25 DIAGNOSIS — I10 Essential (primary) hypertension: Secondary | ICD-10-CM | POA: Insufficient documentation

## 2011-06-25 DIAGNOSIS — J45909 Unspecified asthma, uncomplicated: Secondary | ICD-10-CM | POA: Insufficient documentation

## 2011-06-25 DIAGNOSIS — R0989 Other specified symptoms and signs involving the circulatory and respiratory systems: Secondary | ICD-10-CM

## 2011-06-25 DIAGNOSIS — Z86711 Personal history of pulmonary embolism: Secondary | ICD-10-CM | POA: Insufficient documentation

## 2011-06-25 DIAGNOSIS — R072 Precordial pain: Secondary | ICD-10-CM

## 2011-06-25 DIAGNOSIS — I4891 Unspecified atrial fibrillation: Secondary | ICD-10-CM | POA: Insufficient documentation

## 2011-06-25 DIAGNOSIS — I714 Abdominal aortic aneurysm, without rupture, unspecified: Secondary | ICD-10-CM | POA: Insufficient documentation

## 2011-06-25 DIAGNOSIS — I6529 Occlusion and stenosis of unspecified carotid artery: Secondary | ICD-10-CM | POA: Insufficient documentation

## 2011-06-25 DIAGNOSIS — I2699 Other pulmonary embolism without acute cor pulmonale: Secondary | ICD-10-CM

## 2011-06-25 DIAGNOSIS — I2510 Atherosclerotic heart disease of native coronary artery without angina pectoris: Secondary | ICD-10-CM | POA: Insufficient documentation

## 2011-06-25 DIAGNOSIS — Z87891 Personal history of nicotine dependence: Secondary | ICD-10-CM | POA: Insufficient documentation

## 2011-06-25 DIAGNOSIS — R0609 Other forms of dyspnea: Secondary | ICD-10-CM | POA: Insufficient documentation

## 2011-06-25 NOTE — Telephone Encounter (Signed)
Error Debbie Camellia Popescu RN  

## 2011-06-25 NOTE — Telephone Encounter (Signed)
I talked with Dr. Craige Cotta office re appt for pt at request of NP Lawson Fiscal.  Pt recent increasing shortness of breath and desaturation as per ov 10/2.  They will contact pt with appt. Mylo Red RN

## 2011-06-25 NOTE — Telephone Encounter (Signed)
LMTCB at both numbers provided.  

## 2011-06-26 NOTE — Telephone Encounter (Signed)
Thank you :)

## 2011-06-30 ENCOUNTER — Other Ambulatory Visit (HOSPITAL_COMMUNITY): Payer: Medicare Other | Admitting: Radiology

## 2011-07-01 ENCOUNTER — Telehealth: Payer: Self-pay | Admitting: *Deleted

## 2011-07-01 NOTE — Telephone Encounter (Signed)
Echo reviewed and stable. No change in treatment.

## 2011-07-01 NOTE — Telephone Encounter (Signed)
Message copied by Theda Belfast on Tue Jul 01, 2011 12:23 PM ------      Message from: Rosalio Macadamia      Created: Wed Jun 25, 2011  8:32 AM       Ok to report. Labs are satisfactory. Will see what echo shows today. Will probably need to go back to pulmonary.

## 2011-07-01 NOTE — Telephone Encounter (Signed)
I spoke with pt and he is feeling much better. He took a long rest over the weekend. States he is breathing better "this is the best I have felt",  Encouraged him to not over do it.  Lab results given and preliminary ECHO results as Dr. Daleen Squibb will review ECHO with him next week.  Pt is to see Dr. Craige Cotta next month. Mylo Red RN

## 2011-07-08 ENCOUNTER — Encounter: Payer: Self-pay | Admitting: Cardiology

## 2011-07-08 ENCOUNTER — Ambulatory Visit (INDEPENDENT_AMBULATORY_CARE_PROVIDER_SITE_OTHER): Payer: Medicare Other | Admitting: *Deleted

## 2011-07-08 ENCOUNTER — Ambulatory Visit (INDEPENDENT_AMBULATORY_CARE_PROVIDER_SITE_OTHER): Payer: Medicare Other | Admitting: Cardiology

## 2011-07-08 VITALS — BP 130/70 | HR 68 | Ht 72.0 in | Wt 220.0 lb

## 2011-07-08 DIAGNOSIS — I2699 Other pulmonary embolism without acute cor pulmonale: Secondary | ICD-10-CM

## 2011-07-08 DIAGNOSIS — Z7901 Long term (current) use of anticoagulants: Secondary | ICD-10-CM

## 2011-07-08 DIAGNOSIS — I4891 Unspecified atrial fibrillation: Secondary | ICD-10-CM

## 2011-07-08 DIAGNOSIS — I2510 Atherosclerotic heart disease of native coronary artery without angina pectoris: Secondary | ICD-10-CM

## 2011-07-08 DIAGNOSIS — I6529 Occlusion and stenosis of unspecified carotid artery: Secondary | ICD-10-CM

## 2011-07-08 LAB — POCT INR: INR: 1.5

## 2011-07-08 MED ORDER — DABIGATRAN ETEXILATE MESYLATE 150 MG PO CAPS: 150.0000 mg | ORAL_CAPSULE | Freq: Two times a day (BID) | ORAL | Status: DC

## 2011-07-08 NOTE — Patient Instructions (Addendum)
Your physician recommends that you schedule a follow-up appointment in: February 2013  Your physician has requested that you have an abdominal aorta duplex. During this test, an ultrasound is used to evaluate the aorta. Allow 30 minutes for this exam. Do not eat after midnight the day before and avoid carbonated beverages.  February 2013  Your physician has requested that you have a carotid duplex. This test is an ultrasound of the carotid arteries in your neck. It looks at blood flow through these arteries that supply the brain with blood. Allow one hour for this exam. There are no restrictions or special instructions. February 2013  Your physician has recommended you make the following change in your medication:  Stop Coumadin Start Pradaxa

## 2011-07-08 NOTE — Progress Notes (Signed)
HPI  Past Medical History  Diagnosis Date  . Hypertension   . S/P CABG (coronary artery bypass graft)   . GERD (gastroesophageal reflux disease)   . Pulmonary embolism     April 2012 after CABG  . Sleep apnea   . Asthma   . CAD (coronary artery disease)   . Chronic anticoagulation     on coumadin for PE    Past Surgical History  Procedure Date  . Rotator cuff repair   . Penile prosthesis implant   . Knee surgery   . Inguinal hernia repair   . Coronary artery bypass graft March 2012    x 5    Family History  Problem Relation Age of Onset  . Coronary artery disease Mother   . Hypertension Mother   . Heart disease Mother     History   Social History  . Marital Status: Married    Spouse Name: N/A    Number of Children: N/A  . Years of Education: N/A   Occupational History  . Sales    Social History Main Topics  . Smoking status: Former Smoker -- 1.0 packs/day for 35 years    Types: Cigarettes    Quit date: 09/23/1983  . Smokeless tobacco: Never Used  . Alcohol Use: 1.5 oz/week    3 drink(s) per week  . Drug Use: No  . Sexually Active: Not on file   Other Topics Concern  . Not on file   Social History Narrative  . No narrative on file    Allergies  Allergen Reactions  . Ancef (Cefazolin Sodium)   . Vancomycin     Current Outpatient Prescriptions  Medication Sig Dispense Refill  . albuterol (PROAIR HFA) 108 (90 BASE) MCG/ACT inhaler Inhale 2 puffs into the lungs every 6 (six) hours as needed for wheezing.  1 Inhaler  5  . Ascorbic Acid (VITAMIN C) 500 MG tablet Take 500 mg by mouth daily.        Marland Kitchen aspirin 81 MG tablet Take 81 mg by mouth daily.        . budesonide-formoterol (SYMBICORT) 160-4.5 MCG/ACT inhaler One puff twice per day for 3 weeks, and then one puff at night  1 Inhaler  11  . cetirizine (ZYRTEC) 10 MG tablet Take 10 mg by mouth daily.        . cholecalciferol (VITAMIN D) 1000 UNITS tablet Take 1,000 Units by mouth daily.        .  metoprolol tartrate (LOPRESSOR) 25 MG tablet 1/2 tablet twice a day       . omeprazole (PRILOSEC) 20 MG capsule Take 1 capsule (20 mg total) by mouth daily.  90 capsule  3  . rosuvastatin (CRESTOR) 10 MG tablet Take 1 tablet (10 mg total) by mouth daily. Once a day  90 tablet  3  . Tamsulosin HCl (FLOMAX) 0.4 MG CAPS Take 1 capsule (0.4 mg total) by mouth daily.  90 capsule  3  . warfarin (COUMADIN) 5 MG tablet Take as directed by the coumadin clinic  30 tablet  3    ROS Negative other than HPI.   PE  Filed Vitals:   07/08/11 0853  BP: 130/70  Pulse: 68  Height: 6' (1.829 m)  Weight: 220 lb (99.791 kg)    EKG  Labs and Studies Reviewed.   Lab Results  Component Value Date   WBC 14.3* 12/26/2010   HGB 11.7* 12/26/2010   HCT 36.3* 12/26/2010   MCV 94.0  12/26/2010   PLT 409* 12/26/2010      Chemistry      Component Value Date/Time   NA 137 12/24/2010 0953   K 4.6 12/24/2010 0953   CL 99 12/24/2010 0953   CO2 26 12/24/2010 0953   BUN 16 12/24/2010 0953   CREATININE 1.27 12/24/2010 0953      Component Value Date/Time   CALCIUM 9.4 12/24/2010 0953   ALKPHOS 58 12/06/2010 0625   AST 21 12/06/2010 0625   ALT 18 12/06/2010 0625   BILITOT 0.8 12/06/2010 0625       No results found for this basename: CHOL   No results found for this basename: HDL   No results found for this basename: LDLCALC   No results found for this basename: TRIG   No results found for this basename: CHOLHDL   Lab Results  Component Value Date   HGBA1C  Value: 5.8 (NOTE)                                                                       According to the ADA Clinical Practice Recommendations for 2011, when HbA1c is used as a screening test:   >=6.5%   Diagnostic of Diabetes Mellitus           (if abnormal result  is confirmed)  5.7-6.4%   Increased risk of developing Diabetes Mellitus  References:Diagnosis and Classification of Diabetes Mellitus,Diabetes Care,2011,34(Suppl 1):S62-S69 and Standards of Medical Care in          Diabetes - 2011,Diabetes Care,2011,34  (Suppl 1):S11-S61.* 12/06/2010   Lab Results  Component Value Date   ALT 18 12/06/2010   AST 21 12/06/2010   ALKPHOS 58 12/06/2010   BILITOT 0.8 12/06/2010   No results found for this basename: TSH

## 2011-07-08 NOTE — Assessment & Plan Note (Signed)
There is no evidence of recurrent pulmonary embolus. Because of his risk factors for thromboembolic event is with his history of atrial fib as well, I recommended extended anticoagulation. Because he has been subtherapeutic Coumadin, I have recommended an antithrombin. Dr. Abner Greenspan of the Coumadin clinic will talk to him. His wife are in agreement.

## 2011-07-08 NOTE — Progress Notes (Signed)
HPI Eric Gordon comes in today for followup. He is doing remarkably better. His echocardiogram showed normal left ventricular systolic function with some right-sided enlargement but no significant pulmonary hypertension. A CT scan showed some scarring in the apices but no acute pulmonary embolus.  He remains on anticoagulation but his INRs have been subtherapeutic a number of times. He has multiple risk factors for recurrent thromboembolic events.  He has some numbness over his midline incision. Other than that he feels remarkably well. Past Medical History  Diagnosis Date  . Hypertension   . S/P CABG (coronary artery bypass graft)   . GERD (gastroesophageal reflux disease)   . Pulmonary embolism     April 2012 after CABG  . Sleep apnea   . Asthma   . CAD (coronary artery disease)   . Chronic anticoagulation     on coumadin for PE    Past Surgical History  Procedure Date  . Rotator cuff repair   . Penile prosthesis implant   . Knee surgery   . Inguinal hernia repair   . Coronary artery bypass graft March 2012    x 5    Family History  Problem Relation Age of Onset  . Coronary artery disease Mother   . Hypertension Mother   . Heart disease Mother     History   Social History  . Marital Status: Married    Spouse Name: N/A    Number of Children: N/A  . Years of Education: N/A   Occupational History  . Sales    Social History Main Topics  . Smoking status: Former Smoker -- 1.0 packs/day for 35 years    Types: Cigarettes    Quit date: 09/23/1983  . Smokeless tobacco: Never Used  . Alcohol Use: 1.5 oz/week    3 drink(s) per week  . Drug Use: No  . Sexually Active: Not on file   Other Topics Concern  . Not on file   Social History Narrative  . No narrative on file    Allergies  Allergen Reactions  . Ancef (Cefazolin Sodium)   . Vancomycin     Current Outpatient Prescriptions  Medication Sig Dispense Refill  . albuterol (PROAIR HFA) 108 (90 BASE)  MCG/ACT inhaler Inhale 2 puffs into the lungs every 6 (six) hours as needed for wheezing.  1 Inhaler  5  . Ascorbic Acid (VITAMIN C) 500 MG tablet Take 500 mg by mouth daily.        Marland Kitchen aspirin 81 MG tablet Take 81 mg by mouth daily.        . budesonide-formoterol (SYMBICORT) 160-4.5 MCG/ACT inhaler One puff twice per day for 3 weeks, and then one puff at night  1 Inhaler  11  . cetirizine (ZYRTEC) 10 MG tablet Take 10 mg by mouth daily.        . cholecalciferol (VITAMIN D) 1000 UNITS tablet Take 1,000 Units by mouth daily.        . metoprolol tartrate (LOPRESSOR) 25 MG tablet 1/2 tablet twice a day       . omeprazole (PRILOSEC) 20 MG capsule Take 1 capsule (20 mg total) by mouth daily.  90 capsule  3  . rosuvastatin (CRESTOR) 10 MG tablet Take 1 tablet (10 mg total) by mouth daily. Once a day  90 tablet  3  . Tamsulosin HCl (FLOMAX) 0.4 MG CAPS Take 1 capsule (0.4 mg total) by mouth daily.  90 capsule  3  . warfarin (COUMADIN) 5 MG tablet Take  as directed by the coumadin clinic  30 tablet  3    ROS Negative other than HPI.   PE General Appearance: well developed, well nourished in no acute distress HEENT: symmetrical face, PERRLA, good dentition  Neck: no JVD, thyromegaly, or adenopathy, trachea midline Chest: symmetric without deformity Cardiac: PMI non-displaced, RRR, normal S1, S2, no gallop or murmur Lung: clear to ausculation and percussion Vascular: all pulses full without bruits  Abdominal: nondistended, nontender, good bowel sounds, no HSM, no bruits Extremities: no cyanosis, clubbing or edema, no sign of DVT, no varicosities  Skin: normal color, no rashes Neuro: alert and oriented x 3, non-focal Pysch: normal affect Filed Vitals:   07/08/11 0853  BP: 130/70  Pulse: 68  Height: 6' (1.829 m)  Weight: 220 lb (99.791 kg)    EKG  Labs and Studies Reviewed.   Lab Results  Component Value Date   WBC 14.3* 12/26/2010   HGB 11.7* 12/26/2010   HCT 36.3* 12/26/2010   MCV 94.0  12/26/2010   PLT 409* 12/26/2010      Chemistry      Component Value Date/Time   NA 137 12/24/2010 0953   K 4.6 12/24/2010 0953   CL 99 12/24/2010 0953   CO2 26 12/24/2010 0953   BUN 16 12/24/2010 0953   CREATININE 1.27 12/24/2010 0953      Component Value Date/Time   CALCIUM 9.4 12/24/2010 0953   ALKPHOS 58 12/06/2010 0625   AST 21 12/06/2010 0625   ALT 18 12/06/2010 0625   BILITOT 0.8 12/06/2010 0625       No results found for this basename: CHOL   No results found for this basename: HDL   No results found for this basename: LDLCALC   No results found for this basename: TRIG   No results found for this basename: CHOLHDL   Lab Results  Component Value Date   HGBA1C  Value: 5.8 (NOTE)                                                                       According to the ADA Clinical Practice Recommendations for 2011, when HbA1c is used as a screening test:   >=6.5%   Diagnostic of Diabetes Mellitus           (if abnormal result  is confirmed)  5.7-6.4%   Increased risk of developing Diabetes Mellitus  References:Diagnosis and Classification of Diabetes Mellitus,Diabetes Care,2011,34(Suppl 1):S62-S69 and Standards of Medical Care in         Diabetes - 2011,Diabetes Care,2011,34  (Suppl 1):S11-S61.* 12/06/2010   Lab Results  Component Value Date   ALT 18 12/06/2010   AST 21 12/06/2010   ALKPHOS 58 12/06/2010   BILITOT 0.8 12/06/2010   No results found for this basename: TSH

## 2011-07-18 ENCOUNTER — Ambulatory Visit (INDEPENDENT_AMBULATORY_CARE_PROVIDER_SITE_OTHER): Payer: Medicare Other | Admitting: *Deleted

## 2011-07-18 DIAGNOSIS — Z7901 Long term (current) use of anticoagulants: Secondary | ICD-10-CM

## 2011-07-18 DIAGNOSIS — I2699 Other pulmonary embolism without acute cor pulmonale: Secondary | ICD-10-CM

## 2011-07-18 LAB — POCT INR: INR: 1.4

## 2011-10-29 ENCOUNTER — Other Ambulatory Visit: Payer: Self-pay | Admitting: Cardiology

## 2011-10-29 ENCOUNTER — Other Ambulatory Visit: Payer: Medicare Other | Admitting: *Deleted

## 2011-10-31 ENCOUNTER — Other Ambulatory Visit: Payer: Self-pay | Admitting: Cardiology

## 2011-10-31 DIAGNOSIS — I6529 Occlusion and stenosis of unspecified carotid artery: Secondary | ICD-10-CM

## 2011-10-31 DIAGNOSIS — I714 Abdominal aortic aneurysm, without rupture: Secondary | ICD-10-CM

## 2011-11-05 ENCOUNTER — Encounter: Payer: Medicare Other | Admitting: Cardiology

## 2011-11-05 ENCOUNTER — Encounter (INDEPENDENT_AMBULATORY_CARE_PROVIDER_SITE_OTHER): Payer: Medicare Other | Admitting: Cardiology

## 2011-11-05 ENCOUNTER — Ambulatory Visit: Payer: Medicare Other | Admitting: Cardiology

## 2011-11-05 ENCOUNTER — Telehealth: Payer: Self-pay | Admitting: Cardiology

## 2011-11-05 DIAGNOSIS — I714 Abdominal aortic aneurysm, without rupture: Secondary | ICD-10-CM | POA: Diagnosis not present

## 2011-11-05 NOTE — Telephone Encounter (Signed)
Pt wondering if he should keep his appt with Dr Daleen Squibb even though he hasn't had the carotid doppler yet?  He states the carotid doppler is a routine f/u and not an urgent problem currently.  Due to Dr Vern Claude tight schedule, I advised him to keep the appt he has and Dr Daleen Squibb or his nurse can call Eric Gordon with the results of the carotid doppler.

## 2011-11-05 NOTE — Telephone Encounter (Signed)
New Msg: Pt calling wanting to speak with nurse. Please return pt call to discuss further.  

## 2011-11-07 ENCOUNTER — Ambulatory Visit (INDEPENDENT_AMBULATORY_CARE_PROVIDER_SITE_OTHER): Payer: Medicare Other | Admitting: Cardiology

## 2011-11-07 ENCOUNTER — Encounter: Payer: Self-pay | Admitting: Cardiology

## 2011-11-07 VITALS — BP 105/45 | HR 66 | Ht 75.5 in | Wt 225.0 lb

## 2011-11-07 DIAGNOSIS — J45909 Unspecified asthma, uncomplicated: Secondary | ICD-10-CM

## 2011-11-07 DIAGNOSIS — I2510 Atherosclerotic heart disease of native coronary artery without angina pectoris: Secondary | ICD-10-CM

## 2011-11-07 DIAGNOSIS — I2699 Other pulmonary embolism without acute cor pulmonale: Secondary | ICD-10-CM

## 2011-11-07 DIAGNOSIS — I714 Abdominal aortic aneurysm, without rupture: Secondary | ICD-10-CM | POA: Diagnosis not present

## 2011-11-07 DIAGNOSIS — Z7901 Long term (current) use of anticoagulants: Secondary | ICD-10-CM

## 2011-11-07 DIAGNOSIS — I4891 Unspecified atrial fibrillation: Secondary | ICD-10-CM

## 2011-11-07 DIAGNOSIS — I6529 Occlusion and stenosis of unspecified carotid artery: Secondary | ICD-10-CM

## 2011-11-07 NOTE — Patient Instructions (Signed)
Discontinue Pradaxa.  Your physician wants you to follow-up in: 12 months. You will receive a reminder letter in the mail two months in advance. If you don't receive a letter, please call our office to schedule the follow-up appointment.

## 2011-11-07 NOTE — Assessment & Plan Note (Signed)
Stable. Continue secondary preventive care.

## 2011-11-07 NOTE — Assessment & Plan Note (Signed)
Clinically stable with left external carotid bruit. Continue secondary preventive care.

## 2011-11-07 NOTE — Assessment & Plan Note (Signed)
This was postoperative. We'll discontinue anticoagulation. Signs and symptoms of recurrent DVT or pulmonary embolus reinforced.

## 2011-11-07 NOTE — Assessment & Plan Note (Signed)
No recurrence. Postoperative. discontinue anticoagulation.

## 2011-11-07 NOTE — Assessment & Plan Note (Signed)
Repeat ultrasound November 05, 2011 shows slight increase in infrarenal distal aortic aneurysm. It now measures 3.2 x 3.6 cm. Repeat study in one year.

## 2011-11-07 NOTE — Progress Notes (Signed)
HPI Eric Gordon is returns today for evaluation and management of his history of coronary artery disease, history of bypass surgery in March of 2012, postoperative A. Fib, postoperative pulmonary embolus.  He says he's done remarkably well. His shortness of breath which was his ischemic equivalent is resolved. He has had no further problems with his legs with swelling or pain. He denies any bleeding or hemoptysis. He said the aortic chest pain. He denies any palpitations or symptoms of A. Fib. He remains on anticoagulation.  Blood work from the [facility] reveals lipids to be at goal.  Past Medical History  Diagnosis Date  . Hypertension   . S/P CABG (coronary artery bypass graft)   . GERD (gastroesophageal reflux disease)   . Pulmonary embolism     April 2012 after CABG  . Sleep apnea   . Asthma   . CAD (coronary artery disease)   . Chronic anticoagulation     on coumadin for PE    Current Outpatient Prescriptions  Medication Sig Dispense Refill  . Ascorbic Acid (VITAMIN C) 500 MG tablet Take 500 mg by mouth daily.        Marland Kitchen aspirin 81 MG tablet Take 81 mg by mouth daily.        . cetirizine (ZYRTEC) 10 MG tablet Take 10 mg by mouth daily.        . cholecalciferol (VITAMIN D) 1000 UNITS tablet Take 1,000 Units by mouth daily.        Marland Kitchen levothyroxine (SYNTHROID, LEVOTHROID) 50 MCG tablet Take 50 mcg by mouth daily.      . metoprolol tartrate (LOPRESSOR) 25 MG tablet 1/2 tablet twice a day       . omeprazole (PRILOSEC) 20 MG capsule Take 1 capsule (20 mg total) by mouth daily.  90 capsule  3  . rosuvastatin (CRESTOR) 10 MG tablet Take 5 mg by mouth daily. Once a day      . Tamsulosin HCl (FLOMAX) 0.4 MG CAPS Take 1 capsule (0.4 mg total) by mouth daily.  90 capsule  3  . DISCONTD: rosuvastatin (CRESTOR) 10 MG tablet Take 1 tablet (10 mg total) by mouth daily. Once a day  90 tablet  3    Allergies  Allergen Reactions  . Ancef (Cefazolin Sodium)   . Vancomycin     Family History    Problem Relation Age of Onset  . Coronary artery disease Mother   . Hypertension Mother   . Heart disease Mother     History   Social History  . Marital Status: Married    Spouse Name: N/A    Number of Children: N/A  . Years of Education: N/A   Occupational History  . Sales    Social History Main Topics  . Smoking status: Former Smoker -- 1.0 packs/day for 35 years    Types: Cigarettes    Quit date: 09/23/1983  . Smokeless tobacco: Never Used  . Alcohol Use: 1.5 oz/week    3 drink(s) per week  . Drug Use: No  . Sexually Active: Not on file   Other Topics Concern  . Not on file   Social History Narrative  . No narrative on file    ROS ALL NEGATIVE EXCEPT THOSE NOTED IN HPI  PE  General Appearance: well developed, well nourished in no acute distress HEENT: symmetrical face, PERRLA, good dentition  Neck: no JVD, thyromegaly, or adenopathy, trachea midline Chest: symmetric without deformity Cardiac: PMI non-displaced, RRR, normal S1, S2, no gallop  or murmur Lung: clear to ausculation and percussion Vascular: all pulses full without bruits  Abdominal: nondistended, nontender, good bowel sounds, no HSM, no bruits Extremities: no cyanosis, clubbing or edema, no sign of DVT, no varicosities  Skin: normal color, no rashes Neuro: alert and oriented x 3, non-focal Pysch: normal affect  EKG Not repeated BMET    Component Value Date/Time   NA 137 12/24/2010 0953   K 4.6 12/24/2010 0953   CL 99 12/24/2010 0953   CO2 26 12/24/2010 0953   GLUCOSE 126* 12/24/2010 0953   BUN 16 12/24/2010 0953   CREATININE 1.27 12/24/2010 0953   CALCIUM 9.4 12/24/2010 0953   GFRNONAA 55* 12/24/2010 0953   GFRAA  Value: >60        The eGFR has been calculated using the MDRD equation. This calculation has not been validated in all clinical situations. eGFR's persistently <60 mL/min signify possible Chronic Kidney Disease. 12/24/2010 0953    Lipid Panel  No results found for this basename: chol, trig,  hdl, cholhdl, vldl, ldlcalc    CBC    Component Value Date/Time   WBC 14.3* 12/26/2010 0536   RBC 3.86* 12/26/2010 0536   HGB 11.7* 12/26/2010 0536   HCT 36.3* 12/26/2010 0536   PLT 409* 12/26/2010 0536   MCV 94.0 12/26/2010 0536   MCH 30.3 12/26/2010 0536   MCHC 32.2 12/26/2010 0536   RDW 13.2 12/26/2010 0536   LYMPHSABS 1.6 12/19/2010 0314   MONOABS 1.3* 12/19/2010 0314   EOSABS 0.5 12/19/2010 0314   BASOSABS 0.0 12/19/2010 0314

## 2011-11-17 ENCOUNTER — Encounter (INDEPENDENT_AMBULATORY_CARE_PROVIDER_SITE_OTHER): Payer: Medicare Other

## 2011-11-17 DIAGNOSIS — R0989 Other specified symptoms and signs involving the circulatory and respiratory systems: Secondary | ICD-10-CM | POA: Diagnosis not present

## 2011-11-17 DIAGNOSIS — I6529 Occlusion and stenosis of unspecified carotid artery: Secondary | ICD-10-CM

## 2011-11-18 DIAGNOSIS — Z125 Encounter for screening for malignant neoplasm of prostate: Secondary | ICD-10-CM | POA: Diagnosis not present

## 2011-11-18 DIAGNOSIS — E785 Hyperlipidemia, unspecified: Secondary | ICD-10-CM | POA: Diagnosis not present

## 2011-11-18 DIAGNOSIS — I10 Essential (primary) hypertension: Secondary | ICD-10-CM | POA: Diagnosis not present

## 2011-11-18 DIAGNOSIS — E119 Type 2 diabetes mellitus without complications: Secondary | ICD-10-CM | POA: Diagnosis not present

## 2011-11-25 DIAGNOSIS — G250 Essential tremor: Secondary | ICD-10-CM | POA: Diagnosis not present

## 2011-11-25 DIAGNOSIS — E039 Hypothyroidism, unspecified: Secondary | ICD-10-CM | POA: Diagnosis not present

## 2011-11-25 DIAGNOSIS — I2510 Atherosclerotic heart disease of native coronary artery without angina pectoris: Secondary | ICD-10-CM | POA: Diagnosis not present

## 2011-11-25 DIAGNOSIS — Z0000 Encounter for general adult medical examination without abnormal findings: Secondary | ICD-10-CM | POA: Diagnosis not present

## 2011-11-25 DIAGNOSIS — G252 Other specified forms of tremor: Secondary | ICD-10-CM | POA: Diagnosis not present

## 2011-11-26 DIAGNOSIS — Z1212 Encounter for screening for malignant neoplasm of rectum: Secondary | ICD-10-CM | POA: Diagnosis not present

## 2011-12-31 ENCOUNTER — Telehealth: Payer: Self-pay | Admitting: Cardiology

## 2011-12-31 NOTE — Telephone Encounter (Signed)
Pt wife calls because the [facility] is switching his Crestor to Atorvastatin.  They feel this is due to the cost of Crestor. Reassured wife that Dr. Daleen Squibb has reviewed and recommended Atorvastatin 20mg  qhs.  Mylo Red RN

## 2011-12-31 NOTE — Telephone Encounter (Signed)
Patient wife Nettie Elm request return call regarding patient Crestor medication change to generic for Lipitor.  Mrs. Nettie Elm can be reached at [phone].

## 2012-01-06 DIAGNOSIS — M5137 Other intervertebral disc degeneration, lumbosacral region: Secondary | ICD-10-CM | POA: Diagnosis not present

## 2012-01-14 DIAGNOSIS — R972 Elevated prostate specific antigen [PSA]: Secondary | ICD-10-CM | POA: Diagnosis not present

## 2012-01-21 DIAGNOSIS — R972 Elevated prostate specific antigen [PSA]: Secondary | ICD-10-CM | POA: Diagnosis not present

## 2012-01-21 DIAGNOSIS — N401 Enlarged prostate with lower urinary tract symptoms: Secondary | ICD-10-CM | POA: Diagnosis not present

## 2012-01-22 DIAGNOSIS — H02839 Dermatochalasis of unspecified eye, unspecified eyelid: Secondary | ICD-10-CM | POA: Diagnosis not present

## 2012-01-22 DIAGNOSIS — H259 Unspecified age-related cataract: Secondary | ICD-10-CM | POA: Diagnosis not present

## 2012-01-22 DIAGNOSIS — H52209 Unspecified astigmatism, unspecified eye: Secondary | ICD-10-CM | POA: Diagnosis not present

## 2012-01-22 DIAGNOSIS — H524 Presbyopia: Secondary | ICD-10-CM | POA: Diagnosis not present

## 2012-02-24 DIAGNOSIS — G4730 Sleep apnea, unspecified: Secondary | ICD-10-CM | POA: Diagnosis not present

## 2012-03-09 DIAGNOSIS — G4710 Hypersomnia, unspecified: Secondary | ICD-10-CM | POA: Diagnosis not present

## 2012-06-01 DIAGNOSIS — I519 Heart disease, unspecified: Secondary | ICD-10-CM | POA: Diagnosis not present

## 2012-06-01 DIAGNOSIS — I2510 Atherosclerotic heart disease of native coronary artery without angina pectoris: Secondary | ICD-10-CM | POA: Diagnosis not present

## 2012-06-01 DIAGNOSIS — E119 Type 2 diabetes mellitus without complications: Secondary | ICD-10-CM | POA: Diagnosis not present

## 2012-06-01 DIAGNOSIS — E039 Hypothyroidism, unspecified: Secondary | ICD-10-CM | POA: Diagnosis not present

## 2012-06-02 DIAGNOSIS — M5137 Other intervertebral disc degeneration, lumbosacral region: Secondary | ICD-10-CM | POA: Diagnosis not present

## 2012-06-03 DIAGNOSIS — Z23 Encounter for immunization: Secondary | ICD-10-CM | POA: Diagnosis not present

## 2012-06-11 ENCOUNTER — Other Ambulatory Visit: Payer: Self-pay | Admitting: *Deleted

## 2012-06-11 DIAGNOSIS — I6529 Occlusion and stenosis of unspecified carotid artery: Secondary | ICD-10-CM

## 2012-06-14 ENCOUNTER — Encounter (INDEPENDENT_AMBULATORY_CARE_PROVIDER_SITE_OTHER): Payer: Medicare Other

## 2012-06-14 DIAGNOSIS — I6529 Occlusion and stenosis of unspecified carotid artery: Secondary | ICD-10-CM

## 2012-06-17 ENCOUNTER — Telehealth: Payer: Self-pay | Admitting: Cardiology

## 2012-06-17 NOTE — Telephone Encounter (Signed)
Pt aware of carotid doppler results and repeat in 6 months. Mylo Red RN

## 2012-06-17 NOTE — Telephone Encounter (Signed)
New Problem:    Patient returned Eric Gordon's call about his doppler results.  Please call back.

## 2012-06-28 DIAGNOSIS — R0902 Hypoxemia: Secondary | ICD-10-CM | POA: Diagnosis not present

## 2012-06-28 DIAGNOSIS — J45909 Unspecified asthma, uncomplicated: Secondary | ICD-10-CM | POA: Diagnosis not present

## 2012-06-28 DIAGNOSIS — J13 Pneumonia due to Streptococcus pneumoniae: Secondary | ICD-10-CM | POA: Diagnosis not present

## 2012-06-28 DIAGNOSIS — R05 Cough: Secondary | ICD-10-CM | POA: Diagnosis not present

## 2012-07-05 DIAGNOSIS — R0989 Other specified symptoms and signs involving the circulatory and respiratory systems: Secondary | ICD-10-CM | POA: Diagnosis not present

## 2012-07-05 DIAGNOSIS — R05 Cough: Secondary | ICD-10-CM | POA: Diagnosis not present

## 2012-07-05 DIAGNOSIS — J13 Pneumonia due to Streptococcus pneumoniae: Secondary | ICD-10-CM | POA: Diagnosis not present

## 2012-07-05 DIAGNOSIS — R0609 Other forms of dyspnea: Secondary | ICD-10-CM | POA: Diagnosis not present

## 2012-07-05 DIAGNOSIS — R0902 Hypoxemia: Secondary | ICD-10-CM | POA: Diagnosis not present

## 2012-07-23 DIAGNOSIS — E119 Type 2 diabetes mellitus without complications: Secondary | ICD-10-CM | POA: Diagnosis not present

## 2012-07-23 DIAGNOSIS — Z1331 Encounter for screening for depression: Secondary | ICD-10-CM | POA: Diagnosis not present

## 2012-07-23 DIAGNOSIS — R0989 Other specified symptoms and signs involving the circulatory and respiratory systems: Secondary | ICD-10-CM | POA: Diagnosis not present

## 2012-07-23 DIAGNOSIS — R0609 Other forms of dyspnea: Secondary | ICD-10-CM | POA: Diagnosis not present

## 2012-07-23 DIAGNOSIS — R0902 Hypoxemia: Secondary | ICD-10-CM | POA: Diagnosis not present

## 2012-08-23 DIAGNOSIS — I10 Essential (primary) hypertension: Secondary | ICD-10-CM | POA: Diagnosis not present

## 2012-08-23 DIAGNOSIS — I519 Heart disease, unspecified: Secondary | ICD-10-CM | POA: Diagnosis not present

## 2012-08-23 DIAGNOSIS — R0902 Hypoxemia: Secondary | ICD-10-CM | POA: Diagnosis not present

## 2012-08-23 DIAGNOSIS — E119 Type 2 diabetes mellitus without complications: Secondary | ICD-10-CM | POA: Diagnosis not present

## 2012-11-03 ENCOUNTER — Ambulatory Visit (INDEPENDENT_AMBULATORY_CARE_PROVIDER_SITE_OTHER): Payer: Medicare Other | Admitting: Cardiology

## 2012-11-03 ENCOUNTER — Encounter: Payer: Self-pay | Admitting: Cardiology

## 2012-11-03 VITALS — BP 152/92 | HR 61 | Ht 75.5 in | Wt 239.0 lb

## 2012-11-03 DIAGNOSIS — I714 Abdominal aortic aneurysm, without rupture, unspecified: Secondary | ICD-10-CM

## 2012-11-03 DIAGNOSIS — I2510 Atherosclerotic heart disease of native coronary artery without angina pectoris: Secondary | ICD-10-CM

## 2012-11-03 DIAGNOSIS — I350 Nonrheumatic aortic (valve) stenosis: Secondary | ICD-10-CM | POA: Insufficient documentation

## 2012-11-03 DIAGNOSIS — I359 Nonrheumatic aortic valve disorder, unspecified: Secondary | ICD-10-CM | POA: Diagnosis not present

## 2012-11-03 DIAGNOSIS — I6529 Occlusion and stenosis of unspecified carotid artery: Secondary | ICD-10-CM | POA: Diagnosis not present

## 2012-11-03 DIAGNOSIS — R0602 Shortness of breath: Secondary | ICD-10-CM | POA: Diagnosis not present

## 2012-11-03 DIAGNOSIS — G4733 Obstructive sleep apnea (adult) (pediatric): Secondary | ICD-10-CM

## 2012-11-03 DIAGNOSIS — I25810 Atherosclerosis of coronary artery bypass graft(s) without angina pectoris: Secondary | ICD-10-CM

## 2012-11-03 DIAGNOSIS — I10 Essential (primary) hypertension: Secondary | ICD-10-CM

## 2012-11-03 NOTE — Assessment & Plan Note (Signed)
Stable. Continue secondary preventative therapy. 

## 2012-11-03 NOTE — Assessment & Plan Note (Signed)
Asymptomatic. Repeat ultrasound in September. Continue secondary preventative therapy.

## 2012-11-03 NOTE — Assessment & Plan Note (Signed)
This is with exertion. He says his saturations dropped. I think this is pulmonary. I've asked him to see Dr.Sood in followup soon.

## 2012-11-03 NOTE — Progress Notes (Signed)
HPI Eric Gordon returns today for his history of coronary artery disease, history of  bypass grafting in March 2012, postoperative atrial fib, postoperative pulmonary embolus, nonobstructive carotid disease which is asymptomatic, and a small abdominal aortic aneurysm.  He still remains short of breath with activity. Having said this, his sats on room air around 93-94% yet decreases into the 80s with exercise. I've advised him  to see Dr. Craige Cotta in pulmonary. An echo in the past didn't show any pulmonary hypertension but he does have right atrial large and right ventricular enlargement. I suspect all this is pulmonary and not cardiac. He denies orthopnea, PND or edema. He is now off anticoagulation.  Past Medical History  Diagnosis Date  . Hypertension   . S/P CABG (coronary artery bypass graft)   . GERD (gastroesophageal reflux disease)   . Pulmonary embolism     April 2012 after CABG  . Sleep apnea   . Asthma   . CAD (coronary artery disease)   . Chronic anticoagulation     on coumadin for PE    Current Outpatient Prescriptions  Medication Sig Dispense Refill  . ALBUTEROL IN Inhale into the lungs as needed.      . Ascorbic Acid (VITAMIN C) 500 MG tablet Take 500 mg by mouth daily.        Marland Kitchen aspirin 81 MG tablet Take 81 mg by mouth daily.        Marland Kitchen atorvastatin (LIPITOR) 40 MG tablet Take 40 mg by mouth daily.      . budesonide-formoterol (SYMBICORT) 160-4.5 MCG/ACT inhaler Inhale 2 puffs into the lungs 2 (two) times daily.      . cetirizine (ZYRTEC) 10 MG tablet Take 10 mg by mouth daily.        . cholecalciferol (VITAMIN D) 1000 UNITS tablet Take 1,000 Units by mouth daily.        . furosemide (LASIX) 20 MG tablet Take 10 mg by mouth daily.      Marland Kitchen levothyroxine (SYNTHROID, LEVOTHROID) 50 MCG tablet Take 50 mcg by mouth daily.      . metoprolol tartrate (LOPRESSOR) 25 MG tablet 1/2 tablet twice a day       . Multiple Vitamins-Minerals (CENTRUM SILVER PO) Take by mouth daily.      Marland Kitchen  omeprazole (PRILOSEC) 20 MG capsule Take 1 capsule (20 mg total) by mouth daily.  90 capsule  3  . Tamsulosin HCl (FLOMAX) 0.4 MG CAPS Take 1 capsule (0.4 mg total) by mouth daily.  90 capsule  3  . rosuvastatin (CRESTOR) 10 MG tablet Take 5 mg by mouth daily. Once a day       No current facility-administered medications for this visit.    Allergies  Allergen Reactions  . Ancef (Cefazolin Sodium)   . Vancomycin     Family History  Problem Relation Age of Onset  . Coronary artery disease Mother   . Hypertension Mother   . Heart disease Mother     History   Social History  . Marital Status: Married    Spouse Name: N/A    Number of Children: N/A  . Years of Education: N/A   Occupational History  . Sales    Social History Main Topics  . Smoking status: Former Smoker -- 1.00 packs/day for 35 years    Types: Cigarettes    Quit date: 09/23/1983  . Smokeless tobacco: Never Used  . Alcohol Use: 1.5 oz/week    3 drink(s) per week  .  Drug Use: No  . Sexually Active: Not on file   Other Topics Concern  . Not on file   Social History Narrative  . No narrative on file    ROS ALL NEGATIVE EXCEPT THOSE NOTED IN HPI  PE  General Appearance: well developed, well nourished in no acute distress, skin color good, looks better than last time I saw him HEENT: symmetrical face, PERRLA, good dentition  Neck: no JVD, thyromegaly, or adenopathy, trachea midline Chest: symmetric without deformity Cardiac: PMI non-displaced, RRR, normal S1, S2, no gallop or murmur Lung: clear to ausculation and percussion, decreased breath sounds in the apices Vascular: all pulses full without bruits  Abdominal: nondistended, nontender, good bowel sounds, no HSM, no bruits Extremities: no cyanosis, clubbing or edema, no sign of DVT, no varicosities  Skin: normal color, no rashes Neuro: alert and oriented x 3, non-focal Pysch: normal affect  EKG Normal sinus rhythm, pulmonary disease pattern, left  anterior fascicular block, minimal voltage criteria for LVH, no changes since last EKG. BMET    Component Value Date/Time   NA 137 12/24/2010 0953   K 4.6 12/24/2010 0953   CL 99 12/24/2010 0953   CO2 26 12/24/2010 0953   GLUCOSE 126* 12/24/2010 0953   BUN 16 12/24/2010 0953   CREATININE 1.27 12/24/2010 0953   CALCIUM 9.4 12/24/2010 0953   GFRNONAA 55* 12/24/2010 0953   GFRAA  Value: >60        The eGFR has been calculated using the MDRD equation. This calculation has not been validated in all clinical situations. eGFR's persistently <60 mL/min signify possible Chronic Kidney Disease. 12/24/2010 0953    Lipid Panel  No results found for this basename: chol, trig, hdl, cholhdl, vldl, ldlcalc    CBC    Component Value Date/Time   WBC 14.3* 12/26/2010 0536   RBC 3.86* 12/26/2010 0536   HGB 11.7* 12/26/2010 0536   HCT 36.3* 12/26/2010 0536   PLT 409* 12/26/2010 0536   MCV 94.0 12/26/2010 0536   MCH 30.3 12/26/2010 0536   MCHC 32.2 12/26/2010 0536   RDW 13.2 12/26/2010 0536   LYMPHSABS 1.6 12/19/2010 0314   MONOABS 1.3* 12/19/2010 0314   EOSABS 0.5 12/19/2010 0314   BASOSABS 0.0 12/19/2010 0314

## 2012-11-03 NOTE — Patient Instructions (Addendum)
Your physician has requested that you have an abdominal aorta duplex. During this test, an ultrasound is used to evaluate the aorta. Allow 30 minutes for this exam. Do not eat after midnight the day before and avoid carbonated beverages  Your physician has requested that you have a carotid duplex in September 2014. This test is an ultrasound of the carotid arteries in your neck. It looks at blood flow through these arteries that supply the brain with blood. Allow one hour for this exam. There are no restrictions or special instructions.  Your physician has requested that you have an echocardiogram same day as carotid in September. Echocardiography is a painless test that uses sound waves to create images of your heart. It provides your doctor with information about the size and shape of your heart and how well your heart's chambers and valves are working. This procedure takes approximately one hour. There are no restrictions for this procedure.  Follow-up with Dr. Craige Cotta   Your physician wants you to follow-up in: 1 year with Dr. Shirlee Latch. You will receive a reminder letter in the mail two months in advance. If you don't receive a letter, please call our office to schedule the follow-up appointment.

## 2012-11-03 NOTE — Assessment & Plan Note (Signed)
Asymptomatic. Repeat abdominal ultrasound.

## 2012-11-03 NOTE — Assessment & Plan Note (Signed)
Repeat echo in September. I'll schedule followup with Dr. Jearld Pies is his primary cardiologist.

## 2012-11-23 ENCOUNTER — Ambulatory Visit (INDEPENDENT_AMBULATORY_CARE_PROVIDER_SITE_OTHER)
Admission: RE | Admit: 2012-11-23 | Discharge: 2012-11-23 | Disposition: A | Discharge status: Home / Self Care | Payer: Medicare Other | Source: Ambulatory Visit | Attending: Pulmonary Disease | Admitting: Pulmonary Disease

## 2012-11-23 ENCOUNTER — Ambulatory Visit (INDEPENDENT_AMBULATORY_CARE_PROVIDER_SITE_OTHER): Payer: Medicare Other | Admitting: Pulmonary Disease

## 2012-11-23 ENCOUNTER — Encounter: Payer: Self-pay | Admitting: Pulmonary Disease

## 2012-11-23 ENCOUNTER — Encounter (HOSPITAL_COMMUNITY)
Admission: RE | Admit: 2012-11-23 | Discharge: 2012-11-23 | Disposition: A | Discharge status: Home / Self Care | Payer: Medicare Other | Source: Ambulatory Visit | Attending: Pulmonary Disease | Admitting: Pulmonary Disease

## 2012-11-23 VITALS — BP 144/88 | HR 73 | Temp 97.3°F | Ht 75.0 in | Wt 239.4 lb

## 2012-11-23 DIAGNOSIS — R0602 Shortness of breath: Secondary | ICD-10-CM | POA: Diagnosis not present

## 2012-11-23 DIAGNOSIS — R0609 Other forms of dyspnea: Secondary | ICD-10-CM | POA: Insufficient documentation

## 2012-11-23 DIAGNOSIS — R0989 Other specified symptoms and signs involving the circulatory and respiratory systems: Secondary | ICD-10-CM

## 2012-11-23 DIAGNOSIS — J449 Chronic obstructive pulmonary disease, unspecified: Secondary | ICD-10-CM | POA: Diagnosis not present

## 2012-11-23 DIAGNOSIS — R0600 Dyspnea, unspecified: Secondary | ICD-10-CM

## 2012-11-23 MED ORDER — PREDNISONE 10 MG PO TABS: ORAL_TABLET | ORAL | Status: DC

## 2012-11-23 MED ORDER — TECHNETIUM TC 99M DIETHYLENETRIAME-PENTAACETIC ACID: 40.0000 | Freq: Once | INTRAVENOUS | Status: AC | PRN

## 2012-11-23 MED ORDER — TECHNETIUM TO 99M ALBUMIN AGGREGATED
6.0000 | Freq: Once | INTRAVENOUS | Status: AC | PRN
  Administered 2012-11-23: 6 via INTRAVENOUS

## 2012-11-23 NOTE — Progress Notes (Signed)
Chief Complaint  Patient presents with  . Follow-up    Pt reports in Sept/OCt 2013 was placed on o2 x 2months had pna, since then pt states breathing has gotten progressively worse, w hoarseness at times when he has sob and chest tightnesss    History of Present Illness: Eric Gordon is a 79 y.o. male former smoker with with dyspnea with hx of asthma, CAD s/p CABG, PE after CABG.  He was last seen August 2012.  He returns for further assessment of his dyspnea.  He was seen recently by Dr. Daleen Squibb, and there was concern that his dyspnea was related to pulmonary disease.  As a result he was referred back to pulmonary medicine.  He was treated for a pneumonia in September 2013.  He needed oxygen at home for 2 months after this, but has not been using oxygen since.  He was treated with antibiotics and prednisone.  He was also started on symbicort again.  His breathing has not been right since then.  He can get winded even at rest sometimes.  However, he is also able to exercise at the [facility] on a stationary bike for an hour several times per week.  He has a portable pulse oximeter, and has noticed his oxygen level can sometimes drop to the 80's.  His wife notes that his voice can seem week when he gets short of breath.  He denies wheeze, cough, or chest congestion.  He does get tightness in his chest.  He feels albuterol does help some, but does not last.  He has prior history of PE, but has not been on coumadin or pradaxa for several months.  He denies leg pain/swelling, skin rash, or fever.  He is scheduled for repeat Echo in September 2014 to monitor his mild aortic stenosis.  He was ambulated in office today on room air w/o oxygen desaturation (Sp02 low 92%, maximal HR 100).  He reports he will be changing cardiologists from Dr. Daleen Squibb to Dr. Marca Ancona.  TESTS: PFT 02/08/10 >> FEV1 2.93(76%), FEV1% 73, TLC 6.51(78%), DLCO 67%, Positive BD response Echo 06/25/11 >> mild LVH, EF 50 to  55%, mild AS, mod LA/RA dilation, mild RV dilation  Eric Gordon  has a past medical history of Hypertension; S/P CABG (coronary artery bypass graft); GERD (gastroesophageal reflux disease); Pulmonary embolism; Sleep apnea; Asthma; CAD (coronary artery disease); and Chronic anticoagulation.  Eric Gordon  has past surgical history that includes Rotator cuff repair (2003); Penile prosthesis implant; knee surgery (2006); Inguinal hernia repair (1980); and Coronary artery bypass graft (March 2012).  Prior to Admission medications   Medication Sig Start Date End Date Taking? Authorizing Provider  ALBUTEROL IN Inhale into the lungs as needed.   Yes Historical Provider, MD  Ascorbic Acid (VITAMIN C) 500 MG tablet Take 500 mg by mouth daily.     Yes Historical Provider, MD  aspirin 81 MG tablet Take 81 mg by mouth daily.     Yes Historical Provider, MD  atorvastatin (LIPITOR) 40 MG tablet Take 40 mg by mouth daily.   Yes Historical Provider, MD  budesonide-formoterol (SYMBICORT) 160-4.5 MCG/ACT inhaler Inhale 2 puffs into the lungs 2 (two) times daily.   Yes Historical Provider, MD  cetirizine (ZYRTEC) 10 MG tablet Take 10 mg by mouth daily.     Yes Historical Provider, MD  cholecalciferol (VITAMIN D) 1000 UNITS tablet Take 1,000 Units by mouth daily.     Yes Historical Provider, MD  furosemide (  LASIX) 20 MG tablet Take 10 mg by mouth daily.   Yes Historical Provider, MD  levothyroxine (SYNTHROID, LEVOTHROID) 50 MCG tablet Take 50 mcg by mouth daily.   Yes Historical Provider, MD  metoprolol tartrate (LOPRESSOR) 25 MG tablet 20 mg. 1/2 tablet twice a day 04/02/11  Yes Gaylord Shih, MD  Multiple Vitamins-Minerals (CENTRUM SILVER PO) Take by mouth daily.   Yes Historical Provider, MD  omeprazole (PRILOSEC) 20 MG capsule Take 1 capsule (20 mg total) by mouth daily. 04/02/11  Yes Gaylord Shih, MD  Tamsulosin HCl (FLOMAX) 0.4 MG CAPS Take 1 capsule (0.4 mg total) by mouth daily. 04/02/11  Yes  Gaylord Shih, MD    Allergies  Allergen Reactions  . Ancef (Cefazolin Sodium)   . Vancomycin      Physical Exam:  General - No distress ENT - No sinus tenderness, no oral exudate, no LAN Cardiac - s1s2 regular, no murmur Chest - No wheeze/rales/dullness Back - No focal tenderness Abd - Soft, non-tender Ext - No edema Neuro - Normal strength Skin - No rashes Psych - normal mood, and behavior   Assessment/Plan:  Coralyn Helling, MD [place] [facility]/Critical Care/Sleep Pager:  [phone] 11/23/2012, 10:57 AM

## 2012-11-23 NOTE — Patient Instructions (Signed)
Prednisone 10 mg pills >> use as directed Will schedule breathing test (PFT) Will schedule chest xray and V/Q scan Follow up in 3 weeks

## 2012-11-24 ENCOUNTER — Encounter (INDEPENDENT_AMBULATORY_CARE_PROVIDER_SITE_OTHER): Payer: Medicare Other

## 2012-11-24 ENCOUNTER — Telehealth: Payer: Self-pay | Admitting: Pulmonary Disease

## 2012-11-24 DIAGNOSIS — I714 Abdominal aortic aneurysm, without rupture: Secondary | ICD-10-CM | POA: Diagnosis not present

## 2012-11-24 NOTE — Assessment & Plan Note (Signed)
He has intermittent symptoms of dyspnea that are reported to be related to intermittent oxygen desaturation (this was not reproduced in office today).  He reports partial improvement with inhaler therapy.  Will repeat PFT to further assess his asthma/obstructive lung disease.  Will also give him course of prednisone.  He is to continue symbicort and prn albuterol.  He does have prior history of PE.  My suspicion for thrombo-embolic disease is low, but not zero.  Will arrange for chest xray and V/Q scan.  This can also provide information about possible obstructive lung disease.  He was recently evaluated by cardiology.  If he does not have response to above interventions, then additional cardiac evaluation may be needed.

## 2012-11-24 NOTE — Telephone Encounter (Signed)
11/23/2012  *RADIOLOGY REPORT*  Clinical Data: Dyspnea, history asthma, pulmonary embolism, coronary artery disease post CABG, hypertension   CHEST - 2 VIEW   Comparison: 02/20/2011   Findings: Normal heart size post CABG. Atherosclerotic calcification aorta. Mediastinal contours and pulmonary vascularity normal. Emphysematous changes with minimal peribronchial thickening consistent with COPD. Biapical scarring stable. No acute infiltrate, pleural effusion or pneumothorax. No acute osseous findings.   IMPRESSION: Post CABG. COPD. No acute abnormalities.    Original Report Authenticated By: Ulyses Southward, M.D.     11/23/2012  *RADIOLOGY REPORT*   Clinical Data:  Dyspnea  NUCLEAR MEDICINE VENTILATION - PERFUSION LUNG SCAN Technique:  Wash-in, equilibrium, and wash-out phase ventilation images were obtained using technetium 4m DTPA.  Perfusion images were obtained in multiple projections after intravenous injection of Tc-46m MAA.  Radiopharmaceuticals:  40 mCi technetium 61m DTPA aerosol and 6 mCi Tc-65m MAA.   Comparison:  Chest radiograph from 11/23/2012   Findings: On the perfusion portion of the examination there is a uniform distribution of the radiopharmaceutical to both lungs.  On the ventilation portion of the examination there is a normal distribution of the aerosolized technetium DTPA.   IMPRESSION:   1.  Very low probability for acute pulmonary embolus.    Original Report Authenticated By: Signa Kell, M.D.     Results discussed with patient.  Will await response to trial of prednisone and results of PFT.

## 2012-11-25 DIAGNOSIS — E781 Pure hyperglyceridemia: Secondary | ICD-10-CM | POA: Diagnosis not present

## 2012-11-25 DIAGNOSIS — I2510 Atherosclerotic heart disease of native coronary artery without angina pectoris: Secondary | ICD-10-CM | POA: Diagnosis not present

## 2012-11-25 DIAGNOSIS — E039 Hypothyroidism, unspecified: Secondary | ICD-10-CM | POA: Diagnosis not present

## 2012-11-25 DIAGNOSIS — E119 Type 2 diabetes mellitus without complications: Secondary | ICD-10-CM | POA: Diagnosis not present

## 2012-11-25 DIAGNOSIS — I10 Essential (primary) hypertension: Secondary | ICD-10-CM | POA: Diagnosis not present

## 2012-11-25 DIAGNOSIS — Z125 Encounter for screening for malignant neoplasm of prostate: Secondary | ICD-10-CM | POA: Diagnosis not present

## 2012-12-02 DIAGNOSIS — I2510 Atherosclerotic heart disease of native coronary artery without angina pectoris: Secondary | ICD-10-CM | POA: Diagnosis not present

## 2012-12-02 DIAGNOSIS — I714 Abdominal aortic aneurysm, without rupture: Secondary | ICD-10-CM | POA: Diagnosis not present

## 2012-12-02 DIAGNOSIS — R0902 Hypoxemia: Secondary | ICD-10-CM | POA: Diagnosis not present

## 2012-12-02 DIAGNOSIS — Z0000 Encounter for general adult medical examination without abnormal findings: Secondary | ICD-10-CM | POA: Diagnosis not present

## 2012-12-02 DIAGNOSIS — E785 Hyperlipidemia, unspecified: Secondary | ICD-10-CM | POA: Diagnosis not present

## 2012-12-02 DIAGNOSIS — I10 Essential (primary) hypertension: Secondary | ICD-10-CM | POA: Diagnosis not present

## 2012-12-02 DIAGNOSIS — R809 Proteinuria, unspecified: Secondary | ICD-10-CM | POA: Diagnosis not present

## 2012-12-02 DIAGNOSIS — E039 Hypothyroidism, unspecified: Secondary | ICD-10-CM | POA: Diagnosis not present

## 2012-12-03 DIAGNOSIS — Z1212 Encounter for screening for malignant neoplasm of rectum: Secondary | ICD-10-CM | POA: Diagnosis not present

## 2012-12-16 ENCOUNTER — Ambulatory Visit (INDEPENDENT_AMBULATORY_CARE_PROVIDER_SITE_OTHER): Payer: Medicare Other | Admitting: Pulmonary Disease

## 2012-12-16 ENCOUNTER — Encounter: Payer: Self-pay | Admitting: Pulmonary Disease

## 2012-12-16 VITALS — BP 130/80 | HR 68 | Temp 97.6°F | Ht 74.0 in | Wt 233.0 lb

## 2012-12-16 DIAGNOSIS — J45909 Unspecified asthma, uncomplicated: Secondary | ICD-10-CM | POA: Diagnosis not present

## 2012-12-16 DIAGNOSIS — R0602 Shortness of breath: Secondary | ICD-10-CM

## 2012-12-16 DIAGNOSIS — J988 Other specified respiratory disorders: Secondary | ICD-10-CM | POA: Diagnosis not present

## 2012-12-16 DIAGNOSIS — J989 Respiratory disorder, unspecified: Secondary | ICD-10-CM | POA: Insufficient documentation

## 2012-12-16 DIAGNOSIS — R0989 Other specified symptoms and signs involving the circulatory and respiratory systems: Secondary | ICD-10-CM | POA: Diagnosis not present

## 2012-12-16 DIAGNOSIS — R0600 Dyspnea, unspecified: Secondary | ICD-10-CM

## 2012-12-16 DIAGNOSIS — R0609 Other forms of dyspnea: Secondary | ICD-10-CM | POA: Diagnosis not present

## 2012-12-16 LAB — PULMONARY FUNCTION TEST

## 2012-12-16 MED ORDER — MONTELUKAST SODIUM 10 MG PO TABS: 10.0000 mg | ORAL_TABLET | Freq: Every day | ORAL | Status: DC

## 2012-12-16 MED ORDER — TRIAMCINOLONE ACETONIDE(NASAL) 55 MCG/ACT NA INHA: 2.0000 | Freq: Every day | NASAL | Status: DC

## 2012-12-16 NOTE — Progress Notes (Signed)
Chief Complaint  Patient presents with  . Follow-up    PFTs today-- pt reports breathing is ab the same as last visit-- SOB w any activity    History of Present Illness: RAM HAUGAN is a 79 y.o. male former smoker with with dyspnea with hx of asthma, CAD s/p CABG, PE after CABG.  He was tried on prednisone.  This helped some, but he did not get complete relief.  He uses his albuterol twice per day, and again this does not give complete relief.  He has post-nasal drip, especially when working in his yard. His reflux is under control with prilosec.  He still gets winded with activity.  His wife reports that his voice quality is very weak, and he has been getting hoarse.   TESTS: PFT 02/08/10 >> FEV1 2.93(76%), FEV1% 73, TLC 6.51(78%), DLCO 67%, Positive BD response Echo 06/25/11 >> mild LVH, EF 50 to 55%, mild AS, mod LA/RA dilation, mild RV dilation V/Q scan 11/23/12 >> very low probability for PE PFT 12/16/12 >> FEV1 2.75 (91 %), FEV1% 68 , TLC 7.12 (97 %), DLCO 92, no BD, truncation of inspiratory limb  Tri L Vaeth  has a past medical history of Hypertension; S/P CABG (coronary artery bypass graft); GERD (gastroesophageal reflux disease); Pulmonary embolism; Sleep apnea; Asthma; CAD (coronary artery disease); and Chronic anticoagulation.  Brentt Lorella Nimrod  has past surgical history that includes Rotator cuff repair (2003); Penile prosthesis implant; knee surgery (2006); Inguinal hernia repair (1980); and Coronary artery bypass graft (March 2012).  Prior to Admission medications   Medication Sig Start Date End Date Taking? Authorizing Provider  ALBUTEROL IN Inhale into the lungs as needed.   Yes Historical Provider, MD  Ascorbic Acid (VITAMIN C) 500 MG tablet Take 500 mg by mouth daily.     Yes Historical Provider, MD  aspirin 81 MG tablet Take 81 mg by mouth daily.     Yes Historical Provider, MD  atorvastatin (LIPITOR) 40 MG tablet Take 40 mg by mouth daily.   Yes  Historical Provider, MD  budesonide-formoterol (SYMBICORT) 160-4.5 MCG/ACT inhaler Inhale 2 puffs into the lungs 2 (two) times daily.   Yes Historical Provider, MD  cetirizine (ZYRTEC) 10 MG tablet Take 10 mg by mouth daily.     Yes Historical Provider, MD  cholecalciferol (VITAMIN D) 1000 UNITS tablet Take 1,000 Units by mouth daily.     Yes Historical Provider, MD  furosemide (LASIX) 20 MG tablet Take 10 mg by mouth daily.   Yes Historical Provider, MD  levothyroxine (SYNTHROID, LEVOTHROID) 50 MCG tablet Take 50 mcg by mouth daily.   Yes Historical Provider, MD  metoprolol tartrate (LOPRESSOR) 25 MG tablet 20 mg. 1/2 tablet twice a day 04/02/11  Yes Gaylord Shih, MD  Multiple Vitamins-Minerals (CENTRUM SILVER PO) Take by mouth daily.   Yes Historical Provider, MD  omeprazole (PRILOSEC) 20 MG capsule Take 1 capsule (20 mg total) by mouth daily. 04/02/11  Yes Gaylord Shih, MD  Tamsulosin HCl (FLOMAX) 0.4 MG CAPS Take 1 capsule (0.4 mg total) by mouth daily. 04/02/11  Yes Gaylord Shih, MD    Allergies  Allergen Reactions  . Ancef (Cefazolin Sodium)   . Vancomycin      Physical Exam:  General - No distress ENT - No sinus tenderness, no oral exudate, no LAN Cardiac - s1s2 regular, no murmur Chest - No wheeze/rales/dullness Back - No focal tenderness Abd - Soft, non-tender Ext - No edema Neuro -  Normal strength Skin - No rashes Psych - normal mood, and behavior   Assessment/Plan:  Coralyn Helling, MD [place] [facility]/Critical Care/Sleep Pager:  [phone] 12/16/2012, 1:53 PM

## 2012-12-16 NOTE — Progress Notes (Signed)
PFT done today. 

## 2012-12-16 NOTE — Assessment & Plan Note (Signed)
Likely related to asthma, and upper airway difficulties.

## 2012-12-16 NOTE — Patient Instructions (Signed)
Nasacort two sprays each nostril daily Singulair 10 mg pill nightly Will arrange for referral to ENT Follow up in 8 weeks

## 2012-12-16 NOTE — Assessment & Plan Note (Signed)
He is to continue symbicort and prn albuterol.  Will add singulair.

## 2012-12-16 NOTE — Assessment & Plan Note (Signed)
He has rhinitis with post-nasal drip.  He has hx of reflux.  He has truncation of inspiratory loop on flow volume curve.  I will have him start nasacort.  He is to continue zyrtec and use singulair.  He is to continue prilosec.  Will arrange for ENT evaluation to further assess his upper airway.

## 2012-12-28 DIAGNOSIS — R490 Dysphonia: Secondary | ICD-10-CM | POA: Diagnosis not present

## 2013-02-10 ENCOUNTER — Encounter: Payer: Self-pay | Admitting: Pulmonary Disease

## 2013-02-10 ENCOUNTER — Ambulatory Visit (INDEPENDENT_AMBULATORY_CARE_PROVIDER_SITE_OTHER): Payer: Medicare Other | Admitting: Pulmonary Disease

## 2013-02-10 VITALS — BP 122/72 | HR 66 | Temp 97.8°F | Ht 75.0 in | Wt 232.6 lb

## 2013-02-10 DIAGNOSIS — R0602 Shortness of breath: Secondary | ICD-10-CM | POA: Diagnosis not present

## 2013-02-10 DIAGNOSIS — G4733 Obstructive sleep apnea (adult) (pediatric): Secondary | ICD-10-CM | POA: Diagnosis not present

## 2013-02-10 DIAGNOSIS — J45909 Unspecified asthma, uncomplicated: Secondary | ICD-10-CM

## 2013-02-10 NOTE — Patient Instructions (Signed)
Follow up in 6 months 

## 2013-02-10 NOTE — Progress Notes (Signed)
Chief Complaint  Patient presents with  . Follow-up    pt reports good days and bad days w breathing/SOB--also c/o hoarseness-- saw ENT states all tests were negative-- denies any other concerns at this time    History of Present Illness: Eric Gordon is a 79 y.o. male former smoker with with dyspnea with hx of asthma.  His breathing is about the same.  He still has episodes of hoarseness.  He had ENT evaluation, but this was unrevealing.  He has occasional cough.  He is not having sputum production.  He gets occasional chest discomfort from her CABG site, but this resolves spontaneously and not associated with other symptoms.   TESTS: PFT 02/08/10 >> FEV1 2.93(76%), FEV1% 73, TLC 6.51(78%), DLCO 67%, Positive BD response Echo 06/25/11 >> mild LVH, EF 50 to 55%, mild AS, mod LA/RA dilation, mild RV dilation V/Q scan 11/23/12 >> very low probability for PE PFT 12/16/12 >> FEV1 2.75 (91 %), FEV1% 68 , TLC 7.12 (97 %), DLCO 92, no BD, truncation of inspiratory limb  Eric Gordon  has a past medical history of Hypertension; S/P CABG (coronary artery bypass graft); GERD (gastroesophageal reflux disease); Pulmonary embolism; Sleep apnea; Asthma; CAD (coronary artery disease); and Chronic anticoagulation.  Eric Gordon  has past surgical history that includes Rotator cuff repair (2003); Penile prosthesis implant; knee surgery (2006); Inguinal hernia repair (1980); and Coronary artery bypass graft (March 2012).  Prior to Admission medications   Medication Sig Start Date End Date Taking? Authorizing Provider  ALBUTEROL IN Inhale into the lungs as needed.   Yes Historical Provider, MD  Ascorbic Acid (VITAMIN C) 500 MG tablet Take 500 mg by mouth daily.     Yes Historical Provider, MD  aspirin 81 MG tablet Take 81 mg by mouth daily.     Yes Historical Provider, MD  atorvastatin (LIPITOR) 40 MG tablet Take 40 mg by mouth daily.   Yes Historical Provider, MD  budesonide-formoterol  (SYMBICORT) 160-4.5 MCG/ACT inhaler Inhale 2 puffs into the lungs 2 (two) times daily.   Yes Historical Provider, MD  cetirizine (ZYRTEC) 10 MG tablet Take 10 mg by mouth daily.     Yes Historical Provider, MD  cholecalciferol (VITAMIN D) 1000 UNITS tablet Take 1,000 Units by mouth daily.     Yes Historical Provider, MD  furosemide (LASIX) 20 MG tablet Take 10 mg by mouth daily.   Yes Historical Provider, MD  levothyroxine (SYNTHROID, LEVOTHROID) 50 MCG tablet Take 50 mcg by mouth daily.   Yes Historical Provider, MD  metoprolol tartrate (LOPRESSOR) 25 MG tablet 20 mg. 1/2 tablet twice a day 04/02/11  Yes Gaylord Shih, MD  Multiple Vitamins-Minerals (CENTRUM SILVER PO) Take by mouth daily.   Yes Historical Provider, MD  omeprazole (PRILOSEC) 20 MG capsule Take 1 capsule (20 mg total) by mouth daily. 04/02/11  Yes Gaylord Shih, MD  Tamsulosin HCl (FLOMAX) 0.4 MG CAPS Take 1 capsule (0.4 mg total) by mouth daily. 04/02/11  Yes Gaylord Shih, MD    Allergies  Allergen Reactions  . Ancef (Cefazolin Sodium)   . Vancomycin      Physical Exam:  General - No distress ENT - No sinus tenderness, no oral exudate, no LAN Cardiac - s1s2 regular, no murmur Chest - No wheeze/rales/dullness Back - No focal tenderness Abd - Soft, non-tender Ext - No edema Neuro - Normal strength Skin - No rashes Psych - normal mood, and behavior   Assessment/Plan:  Eric Gordon  Eric Cotta, MD [place] [facility]/Critical Care/Sleep Pager:  [phone] 02/10/2013, 2:45 PM

## 2013-02-16 DIAGNOSIS — M5137 Other intervertebral disc degeneration, lumbosacral region: Secondary | ICD-10-CM | POA: Diagnosis not present

## 2013-02-16 DIAGNOSIS — M47817 Spondylosis without myelopathy or radiculopathy, lumbosacral region: Secondary | ICD-10-CM | POA: Diagnosis not present

## 2013-02-21 DIAGNOSIS — H52209 Unspecified astigmatism, unspecified eye: Secondary | ICD-10-CM | POA: Diagnosis not present

## 2013-02-21 DIAGNOSIS — H259 Unspecified age-related cataract: Secondary | ICD-10-CM | POA: Diagnosis not present

## 2013-02-21 DIAGNOSIS — H524 Presbyopia: Secondary | ICD-10-CM | POA: Diagnosis not present

## 2013-02-21 DIAGNOSIS — H02839 Dermatochalasis of unspecified eye, unspecified eyelid: Secondary | ICD-10-CM | POA: Diagnosis not present

## 2013-02-21 NOTE — Assessment & Plan Note (Signed)
CPAP per primary care.

## 2013-02-21 NOTE — Assessment & Plan Note (Signed)
He is to continue symbicort, singulair, and prn albuterol.  He is to continue prn zyrtec for allergies.

## 2013-02-21 NOTE — Assessment & Plan Note (Signed)
Related to asthma. 

## 2013-03-24 DIAGNOSIS — R972 Elevated prostate specific antigen [PSA]: Secondary | ICD-10-CM | POA: Diagnosis not present

## 2013-03-30 DIAGNOSIS — N401 Enlarged prostate with lower urinary tract symptoms: Secondary | ICD-10-CM | POA: Diagnosis not present

## 2013-03-30 DIAGNOSIS — R972 Elevated prostate specific antigen [PSA]: Secondary | ICD-10-CM | POA: Diagnosis not present

## 2013-04-21 ENCOUNTER — Other Ambulatory Visit: Payer: Self-pay | Admitting: Dermatology

## 2013-04-21 DIAGNOSIS — D485 Neoplasm of uncertain behavior of skin: Secondary | ICD-10-CM | POA: Diagnosis not present

## 2013-04-21 DIAGNOSIS — D235 Other benign neoplasm of skin of trunk: Secondary | ICD-10-CM | POA: Diagnosis not present

## 2013-04-21 DIAGNOSIS — L723 Sebaceous cyst: Secondary | ICD-10-CM | POA: Diagnosis not present

## 2013-04-21 DIAGNOSIS — L821 Other seborrheic keratosis: Secondary | ICD-10-CM | POA: Diagnosis not present

## 2013-04-21 DIAGNOSIS — L820 Inflamed seborrheic keratosis: Secondary | ICD-10-CM | POA: Diagnosis not present

## 2013-05-11 DIAGNOSIS — M545 Low back pain: Secondary | ICD-10-CM | POA: Diagnosis not present

## 2013-05-31 ENCOUNTER — Encounter (INDEPENDENT_AMBULATORY_CARE_PROVIDER_SITE_OTHER): Payer: Medicare Other

## 2013-05-31 DIAGNOSIS — I6529 Occlusion and stenosis of unspecified carotid artery: Secondary | ICD-10-CM | POA: Diagnosis not present

## 2013-06-06 ENCOUNTER — Telehealth: Payer: Self-pay | Admitting: Cardiology

## 2013-06-06 NOTE — Addendum Note (Signed)
Addended by: Murrell Redden E on: 06/06/2013 10:39 AM   Modules accepted: Orders

## 2013-06-06 NOTE — Telephone Encounter (Signed)
Notified of carotid duplex results. Will recheck in 1 yr.

## 2013-06-06 NOTE — Telephone Encounter (Signed)
Calling Mylo Red back for Friday she was in the [facility] office.   Test results.  If not there ok to leave results with my wife.

## 2013-06-07 ENCOUNTER — Ambulatory Visit (HOSPITAL_COMMUNITY): Payer: Medicare Other | Attending: Cardiology | Admitting: Radiology

## 2013-06-07 DIAGNOSIS — I359 Nonrheumatic aortic valve disorder, unspecified: Secondary | ICD-10-CM | POA: Insufficient documentation

## 2013-06-07 DIAGNOSIS — G4733 Obstructive sleep apnea (adult) (pediatric): Secondary | ICD-10-CM | POA: Diagnosis not present

## 2013-06-07 DIAGNOSIS — I714 Abdominal aortic aneurysm, without rupture, unspecified: Secondary | ICD-10-CM | POA: Insufficient documentation

## 2013-06-07 DIAGNOSIS — I2510 Atherosclerotic heart disease of native coronary artery without angina pectoris: Secondary | ICD-10-CM | POA: Insufficient documentation

## 2013-06-07 DIAGNOSIS — I10 Essential (primary) hypertension: Secondary | ICD-10-CM | POA: Insufficient documentation

## 2013-06-07 DIAGNOSIS — I6529 Occlusion and stenosis of unspecified carotid artery: Secondary | ICD-10-CM | POA: Diagnosis not present

## 2013-06-07 DIAGNOSIS — I25810 Atherosclerosis of coronary artery bypass graft(s) without angina pectoris: Secondary | ICD-10-CM

## 2013-06-07 DIAGNOSIS — I350 Nonrheumatic aortic (valve) stenosis: Secondary | ICD-10-CM

## 2013-06-07 DIAGNOSIS — Z87891 Personal history of nicotine dependence: Secondary | ICD-10-CM | POA: Diagnosis not present

## 2013-06-07 DIAGNOSIS — R0609 Other forms of dyspnea: Secondary | ICD-10-CM | POA: Diagnosis not present

## 2013-06-07 DIAGNOSIS — R0989 Other specified symptoms and signs involving the circulatory and respiratory systems: Secondary | ICD-10-CM | POA: Insufficient documentation

## 2013-06-07 NOTE — Progress Notes (Signed)
Echocardiogram performed.  

## 2013-06-23 ENCOUNTER — Other Ambulatory Visit: Payer: Self-pay | Admitting: Dermatology

## 2013-06-23 DIAGNOSIS — L723 Sebaceous cyst: Secondary | ICD-10-CM | POA: Diagnosis not present

## 2013-06-30 DIAGNOSIS — E785 Hyperlipidemia, unspecified: Secondary | ICD-10-CM | POA: Diagnosis not present

## 2013-06-30 DIAGNOSIS — M79609 Pain in unspecified limb: Secondary | ICD-10-CM | POA: Diagnosis not present

## 2013-06-30 DIAGNOSIS — E1169 Type 2 diabetes mellitus with other specified complication: Secondary | ICD-10-CM | POA: Diagnosis not present

## 2013-06-30 DIAGNOSIS — E039 Hypothyroidism, unspecified: Secondary | ICD-10-CM | POA: Diagnosis not present

## 2013-06-30 DIAGNOSIS — I10 Essential (primary) hypertension: Secondary | ICD-10-CM | POA: Diagnosis not present

## 2013-06-30 DIAGNOSIS — I714 Abdominal aortic aneurysm, without rupture: Secondary | ICD-10-CM | POA: Diagnosis not present

## 2013-06-30 DIAGNOSIS — I6529 Occlusion and stenosis of unspecified carotid artery: Secondary | ICD-10-CM | POA: Diagnosis not present

## 2013-06-30 DIAGNOSIS — Z23 Encounter for immunization: Secondary | ICD-10-CM | POA: Diagnosis not present

## 2013-06-30 DIAGNOSIS — I2510 Atherosclerotic heart disease of native coronary artery without angina pectoris: Secondary | ICD-10-CM | POA: Diagnosis not present

## 2013-08-02 DIAGNOSIS — L723 Sebaceous cyst: Secondary | ICD-10-CM | POA: Diagnosis not present

## 2013-08-02 DIAGNOSIS — L820 Inflamed seborrheic keratosis: Secondary | ICD-10-CM | POA: Diagnosis not present

## 2013-08-30 DIAGNOSIS — M545 Low back pain: Secondary | ICD-10-CM | POA: Diagnosis not present

## 2013-10-25 ENCOUNTER — Ambulatory Visit: Payer: Medicare Other | Admitting: Pulmonary Disease

## 2013-11-03 ENCOUNTER — Encounter: Payer: Self-pay | Admitting: Cardiology

## 2013-11-03 ENCOUNTER — Ambulatory Visit (INDEPENDENT_AMBULATORY_CARE_PROVIDER_SITE_OTHER): Payer: Medicare Other | Admitting: Cardiology

## 2013-11-03 VITALS — BP 148/74 | HR 68 | Ht 75.0 in | Wt 240.0 lb

## 2013-11-03 DIAGNOSIS — I2510 Atherosclerotic heart disease of native coronary artery without angina pectoris: Secondary | ICD-10-CM

## 2013-11-03 DIAGNOSIS — I714 Abdominal aortic aneurysm, without rupture, unspecified: Secondary | ICD-10-CM

## 2013-11-03 DIAGNOSIS — R0602 Shortness of breath: Secondary | ICD-10-CM | POA: Diagnosis not present

## 2013-11-03 DIAGNOSIS — I4891 Unspecified atrial fibrillation: Secondary | ICD-10-CM

## 2013-11-03 MED ORDER — RAMIPRIL 5 MG PO CAPS: 5.0000 mg | ORAL_CAPSULE | Freq: Every day | ORAL | Status: DC

## 2013-11-03 NOTE — Patient Instructions (Addendum)
Your physician has recommended you make the following change in your medication:   1. Start Ramipril 5 mg once daily  Your physician recommends that you return for lab work in: 1 week, BMET, Lipid, CBC, BNP, PTINR  Your physician has requested that you have an abdominal aorta duplex. During this test, an ultrasound is used to evaluate the aorta. Allow 30 minutes for this exam. Do not eat after midnight the day before and avoid carbonated beverages  Your physician has requested that you have a Right heart cardiac catheterization. Cardiac catheterization is used to diagnose and/or treat various heart conditions. Doctors may recommend this procedure for a number of different reasons. The most common reason is to evaluate chest pain. Chest pain can be a symptom of coronary artery disease (CAD), and cardiac catheterization can show whether plaque is narrowing or blocking your heart's arteries. This procedure is also used to evaluate the valves, as well as measure the blood flow and oxygen levels in different parts of your heart. For further information please visit [email or web address]. Please follow instruction sheet, as given.  Your physician recommends that you schedule a follow-up appointment in: 2 weeks with Dr. Aundra Dubin

## 2013-11-04 ENCOUNTER — Encounter (HOSPITAL_COMMUNITY): Payer: Self-pay | Admitting: Pharmacy Technician

## 2013-11-04 NOTE — Progress Notes (Signed)
Patient ID: Eric Gordon, male   DOB: Feb 06, 1931, 80 y.o.   MRN: [id number] PCP: Dr. Virgina Jock  80 yo with history of CAD s/p CABG presents for cardiology followup.  He has been seen by Dr. Verl Blalock in the past and is seen by me for the first time today.  Prior to CABG, patient had significant exertional dyspnea.  He states that CABG really did not have much effect on his exertional dyspnea.  This continues to be a significant problem for him.  Currently, he is short of breath with yardwork or other forms of moderately strenuous activity.  This has been a stable pattern now since before his CABG. The dyspnea is bothersome to him.  No orthopnea or PND.  No chest pain.  He has bendopnea.  He has low back pain so does not do much walking.  He does try to go to the [facility] around 4 times/week.  PFTs in 3/14 showed only mild obstructive airways disease.  Echo in 9/14 showed normal LV systolic function with moderately dilated RV. Recently, BP has been running high at home,  SBP 150s-160s.  ECG: NSR, RBBB  PMH: 1. Essential tremor 2. CAD: s/p CABG in 3/12.   3. Atrial fibrillation: Only noted post-op CABG.  4. PE: Post-op CABG in 2012.  5. AAA: 3.6 cm on [place] in 3/14.  6. OSA: On CPAP.  7. Carotid stenosis: Carotid dopplers (6/01) with 09-32% LICA stenosis.   8. Asthma 9. PNA x 2 10. Dyspnea: Echo (9/14) with EF 60-65%, mild LVH, very mild AS with mean gradient 10 mmHg, RV moderately dilated.  PFTs (3/14) showed mild obstructive airways disease.   11. HTN 12. Aortic stenosis: Mild.   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   FH: CAD  ROS: All systems reviewed and negative except as per HPI.   Current Outpatient Prescriptions  Medication Sig Dispense Refill  . Ascorbic Acid (VITAMIN C) 500 MG tablet Take 500 mg by mouth daily.        Marland Kitchen aspirin 81 MG tablet Take 81 mg by mouth daily.        Marland Kitchen atorvastatin (LIPITOR) 40 MG tablet Take 40 mg by mouth daily.      .  budesonide-formoterol (SYMBICORT) 160-4.5 MCG/ACT inhaler Inhale 2 puffs into the lungs 2 (two) times daily.      . cetirizine (ZYRTEC) 10 MG tablet Take 10 mg by mouth daily.        . cholecalciferol (VITAMIN D) 1000 UNITS tablet Take 1,000 Units by mouth daily.        . furosemide (LASIX) 20 MG tablet Take 10 mg by mouth daily.      Marland Kitchen levothyroxine (SYNTHROID, LEVOTHROID) 50 MCG tablet Take 50 mcg by mouth daily.      . metoprolol tartrate (LOPRESSOR) 25 MG tablet Take 12.5 mg by mouth 2 (two) times daily.       . montelukast (SINGULAIR) 10 MG tablet Take 1 tablet (10 mg total) by mouth at bedtime.  30 tablet  5  . Multiple Vitamins-Minerals (CENTRUM SILVER PO) Take 1 tablet by mouth daily.       . pantoprazole (PROTONIX) 40 MG tablet Take 40 mg by mouth daily.       . Tamsulosin HCl (FLOMAX) 0.4 MG CAPS Take 1 capsule (0.4 mg total) by mouth daily.  90 capsule  3  . albuterol (PROVENTIL HFA;VENTOLIN HFA) 108 (90 BASE) MCG/ACT inhaler Inhale 2 puffs into the lungs  every 6 (six) hours as needed for wheezing or shortness of breath.      . ramipril (ALTACE) 5 MG capsule Take 1 capsule (5 mg total) by mouth daily.  90 capsule  3   No current facility-administered medications for this visit.    BP 148/74  Pulse 68  Ht 6\' 3"  (1.905 m)  Wt 108.863 kg (240 lb)  BMI 30.00 kg/m2 General: NAD Neck: No JVD, no thyromegaly or thyroid nodule.  Lungs: Clear to auscultation bilaterally with normal respiratory effort. CV: Nondisplaced PMI.  Heart regular S1/S2, no S3/S4, no murmur.  No peripheral edema.  No carotid bruit.  Normal pedal pulses.  Abdomen: Soft, nontender, no hepatosplenomegaly, no distention.  Skin: Intact without lesions or rashes.  Neurologic: Alert and oriented x 3.  Psych: Normal affect. Extremities: No clubbing or cyanosis.   Assessment/Plan: 1. CAD: s/p CABG 3/12.  No chest pain.  Continue ASA 81 and statin.   2. Hyperlipidemia: I will check lipids.  3. Exertional dyspnea:  Bothersome to patient.  He had exertional dyspnea prior to CABG and continues to have the same exertional dyspnea.  I suspect it is not ischemia-related as it has not changed since CABG.  He has a smoking history of PFTs showed only mild obstructive airways disease.  Echo showed normal EF but the RV was moderately dilated.   - I think he ought to have a V/Q scan to rule out chronic PE given history of PAD.  - I will arrange for RHC to measure left and right heart filling pressures and PA pressure.  - Check BNP.  4. HTN: Patient's BP is running high. Given history of CAD, I will start ramipril 5 mg daily with BMET in 1 week.  5. Carotid stenosis: Patient is due for carotid dopplers in 9/15.   [name] 11/04/2013

## 2013-11-10 ENCOUNTER — Ambulatory Visit: Payer: Medicare Other | Admitting: Pulmonary Disease

## 2013-11-10 ENCOUNTER — Other Ambulatory Visit (INDEPENDENT_AMBULATORY_CARE_PROVIDER_SITE_OTHER): Payer: Medicare Other

## 2013-11-10 DIAGNOSIS — I2510 Atherosclerotic heart disease of native coronary artery without angina pectoris: Secondary | ICD-10-CM | POA: Diagnosis not present

## 2013-11-10 DIAGNOSIS — R0602 Shortness of breath: Secondary | ICD-10-CM | POA: Diagnosis not present

## 2013-11-10 LAB — CBC WITH DIFFERENTIAL/PLATELET
Basophils Absolute: 0 10*3/uL (ref 0.0–0.1)
Basophils Relative: 0.4 % (ref 0.0–3.0)
EOS PCT: 3 % (ref 0.0–5.0)
Eosinophils Absolute: 0.3 10*3/uL (ref 0.0–0.7)
HCT: 47.7 % (ref 39.0–52.0)
Hemoglobin: 15.4 g/dL (ref 13.0–17.0)
LYMPHS ABS: 1.9 10*3/uL (ref 0.7–4.0)
LYMPHS PCT: 19.7 % (ref 12.0–46.0)
MCHC: 32.3 g/dL (ref 30.0–36.0)
MCV: 95.5 fl (ref 78.0–100.0)
MONOS PCT: 9.7 % (ref 3.0–12.0)
Monocytes Absolute: 0.9 10*3/uL (ref 0.1–1.0)
NEUTROS PCT: 67.2 % (ref 43.0–77.0)
Neutro Abs: 6.4 10*3/uL (ref 1.4–7.7)
Platelets: 186 10*3/uL (ref 150.0–400.0)
RBC: 5 Mil/uL (ref 4.22–5.81)
RDW: 14.6 % (ref 11.5–14.6)
WBC: 9.6 10*3/uL (ref 4.5–10.5)

## 2013-11-10 LAB — LIPID PANEL
CHOLESTEROL: 142 mg/dL (ref 0–200)
HDL: 38.5 mg/dL — AB (ref >39.00)
LDL Cholesterol: 87 mg/dL (ref 0–99)
TRIGLYCERIDES: 81 mg/dL (ref 0.0–149.0)
Total CHOL/HDL Ratio: 4
VLDL: 16.2 mg/dL (ref 0.0–40.0)

## 2013-11-10 LAB — BASIC METABOLIC PANEL
BUN: 20 mg/dL (ref 6–23)
CALCIUM: 9.3 mg/dL (ref 8.4–10.5)
CO2: 30 mEq/L (ref 19–32)
Chloride: 107 mEq/L (ref 96–112)
Creatinine, Ser: 1.2 mg/dL (ref 0.4–1.5)
GFR: 63.73 mL/min (ref >60.00)
Glucose, Bld: 96 mg/dL (ref 70–99)
Potassium: 4.2 mEq/L (ref 3.5–5.1)
SODIUM: 144 meq/L (ref 135–145)

## 2013-11-10 LAB — PROTIME-INR
INR: 1.1 ratio — AB (ref 0.8–1.0)
PROTHROMBIN TIME: 11.4 s (ref 10.2–12.4)

## 2013-11-10 LAB — BRAIN NATRIURETIC PEPTIDE: Pro B Natriuretic peptide (BNP): 71 pg/mL (ref 0.0–100.0)

## 2013-11-11 ENCOUNTER — Other Ambulatory Visit: Payer: Self-pay | Admitting: *Deleted

## 2013-11-11 DIAGNOSIS — R0602 Shortness of breath: Secondary | ICD-10-CM

## 2013-11-14 ENCOUNTER — Encounter (HOSPITAL_COMMUNITY)
Admission: RE | Disposition: A | Discharge status: Home / Self Care | Payer: Self-pay | Source: Ambulatory Visit | Attending: Cardiology

## 2013-11-14 ENCOUNTER — Ambulatory Visit (HOSPITAL_COMMUNITY)
Admission: RE | Admit: 2013-11-14 | Discharge: 2013-11-14 | Disposition: A | Discharge status: Home / Self Care | Payer: Medicare Other | Source: Ambulatory Visit | Attending: Cardiology | Admitting: Cardiology

## 2013-11-14 DIAGNOSIS — I714 Abdominal aortic aneurysm, without rupture, unspecified: Secondary | ICD-10-CM | POA: Insufficient documentation

## 2013-11-14 DIAGNOSIS — R0609 Other forms of dyspnea: Secondary | ICD-10-CM | POA: Diagnosis not present

## 2013-11-14 DIAGNOSIS — G4733 Obstructive sleep apnea (adult) (pediatric): Secondary | ICD-10-CM | POA: Diagnosis not present

## 2013-11-14 DIAGNOSIS — J45909 Unspecified asthma, uncomplicated: Secondary | ICD-10-CM | POA: Insufficient documentation

## 2013-11-14 DIAGNOSIS — R0602 Shortness of breath: Secondary | ICD-10-CM

## 2013-11-14 DIAGNOSIS — Z87891 Personal history of nicotine dependence: Secondary | ICD-10-CM | POA: Insufficient documentation

## 2013-11-14 DIAGNOSIS — Z7982 Long term (current) use of aspirin: Secondary | ICD-10-CM | POA: Diagnosis not present

## 2013-11-14 DIAGNOSIS — I6529 Occlusion and stenosis of unspecified carotid artery: Secondary | ICD-10-CM | POA: Insufficient documentation

## 2013-11-14 DIAGNOSIS — R0989 Other specified symptoms and signs involving the circulatory and respiratory systems: Principal | ICD-10-CM | POA: Insufficient documentation

## 2013-11-14 DIAGNOSIS — I359 Nonrheumatic aortic valve disorder, unspecified: Secondary | ICD-10-CM | POA: Diagnosis not present

## 2013-11-14 DIAGNOSIS — I10 Essential (primary) hypertension: Secondary | ICD-10-CM | POA: Diagnosis not present

## 2013-11-14 DIAGNOSIS — Z951 Presence of aortocoronary bypass graft: Secondary | ICD-10-CM | POA: Insufficient documentation

## 2013-11-14 DIAGNOSIS — I2510 Atherosclerotic heart disease of native coronary artery without angina pectoris: Secondary | ICD-10-CM | POA: Insufficient documentation

## 2013-11-14 DIAGNOSIS — G252 Other specified forms of tremor: Secondary | ICD-10-CM

## 2013-11-14 DIAGNOSIS — G250 Essential tremor: Secondary | ICD-10-CM | POA: Insufficient documentation

## 2013-11-14 HISTORY — PX: RIGHT HEART CATHETERIZATION: SHX5447

## 2013-11-14 LAB — POCT I-STAT 3, ART BLOOD GAS (G3+)
BICARBONATE: 25.9 meq/L — AB (ref 20.0–24.0)
O2 SAT: 65 %
TCO2: 27 mmol/L (ref 0–100)
pCO2 arterial: 46.6 mmHg — ABNORMAL HIGH (ref 35.0–45.0)
pH, Arterial: 7.352 (ref 7.350–7.450)
pO2, Arterial: 36 mmHg — CL (ref 80.0–100.0)

## 2013-11-14 SURGERY — RIGHT HEART CATH
Anesthesia: LOCAL

## 2013-11-14 MED ORDER — HEPARIN (PORCINE) IN NACL 2-0.9 UNIT/ML-% IJ SOLN
INTRAMUSCULAR | Status: AC
  Filled 2013-11-14: qty 1000

## 2013-11-14 MED ORDER — ACETAMINOPHEN 325 MG PO TABS: 650.0000 mg | ORAL_TABLET | ORAL | Status: DC | PRN

## 2013-11-14 MED ORDER — SODIUM CHLORIDE 0.9 % IV SOLN
250.0000 mL | INTRAVENOUS | Status: DC | PRN
Start: 2013-11-14 — End: 2013-11-14

## 2013-11-14 MED ORDER — FENTANYL CITRATE 0.05 MG/ML IJ SOLN
INTRAMUSCULAR | Status: AC
  Filled 2013-11-14: qty 2

## 2013-11-14 MED ORDER — SODIUM CHLORIDE 0.9 % IJ SOLN: 3.0000 mL | INTRAMUSCULAR | Status: DC | PRN

## 2013-11-14 MED ORDER — ONDANSETRON HCL 4 MG/2ML IJ SOLN: 4.0000 mg | Freq: Four times a day (QID) | INTRAMUSCULAR | Status: DC | PRN

## 2013-11-14 MED ORDER — ASPIRIN 81 MG PO CHEW: 81.0000 mg | CHEWABLE_TABLET | ORAL | Status: AC

## 2013-11-14 MED ORDER — SODIUM CHLORIDE 0.9 % IJ SOLN: 3.0000 mL | Freq: Two times a day (BID) | INTRAMUSCULAR | Status: DC

## 2013-11-14 MED ORDER — LIDOCAINE HCL (PF) 1 % IJ SOLN
INTRAMUSCULAR | Status: AC
  Filled 2013-11-14: qty 30

## 2013-11-14 MED ORDER — SODIUM CHLORIDE 0.9 % IV SOLN
INTRAVENOUS | Status: DC
  Administered 2013-11-14: 1000 mL via INTRAVENOUS

## 2013-11-14 NOTE — Interval H&P Note (Signed)
History and Physical Interval Note:  11/14/2013 8:00 AM  Eric Gordon  has presented today for surgery, with the diagnosis of shortness of breath  The various methods of treatment have been discussed with the patient and family. After consideration of risks, benefits and other options for treatment, the patient has consented to  Procedure(s): RIGHT HEART CATH (N/A) as a surgical intervention .  The patient's history has been reviewed, patient examined, no change in status, stable for surgery.  I have reviewed the patient's chart and labs.  Questions were answered to the patient's satisfaction.     Caius Silbernagel [employer]

## 2013-11-14 NOTE — CV Procedure (Signed)
    Cardiac Catheterization Procedure Note  Name: Eric Gordon MRN: [id number] DOB: Feb 03, 1931  Procedure: Right Heart Cath  Indication: Exertional dyspnea of uncertain etiology.    Procedural Details: The right groin was prepped, draped, and anesthetized with 1% lidocaine. Using the modified Seldinger technique a 7 French sheath was placed in the right femoral vein. A Swan-Ganz catheter was used for the right heart catheterization. Standard protocol was followed for recording of right heart pressures and sampling of oxygen saturations. Fick cardiac output was calculated. There were no immediate procedural complications. The patient was transferred to the post catheterization recovery area for further monitoring.  Procedural Findings: Hemodynamics (mmHg) RA mean 7 RV 33/10 PA 32/12 PCWP mean 13  Oxygen saturations: PA 65% AO 85%  Cardiac Output (Fick) 7.53  Cardiac Index (Fick) 3.18   Final Conclusions:  No significant elevation in right and left heart filling pressures at rest, no pulmonary hypertension.  Oxygen saturation low but may be related to known sleep apnea + sedation.    Patient had V/Q scan in 3/14 with no evidence for chronic PE.  I think that the next step at this point is going to be a CPX.   [name] 11/14/2013, 8:26 AM

## 2013-11-14 NOTE — H&P (View-Only) (Signed)
Patient ID: Eric Gordon, male   DOB: 09/09/1931, 80 y.o.   MRN: [id number] PCP: Dr. Russo  80 yo with history of CAD s/p CABG presents for cardiology followup.  He has been seen by Dr. Wall in the past and is seen by me for the first time today.  Prior to CABG, patient had significant exertional dyspnea.  He states that CABG really did not have much effect on his exertional dyspnea.  This continues to be a significant problem for him.  Currently, he is short of breath with yardwork or other forms of moderately strenuous activity.  This has been a stable pattern now since before his CABG. The dyspnea is bothersome to him.  No orthopnea or PND.  No chest pain.  He has bendopnea.  He has low back pain so does not do much walking.  He does try to go to the [employer] around 4 times/week.  PFTs in 3/14 showed only mild obstructive airways disease.  Echo in 9/14 showed normal LV systolic function with moderately dilated RV. Recently, BP has been running high at home,  SBP 150s-160s.  ECG: NSR, RBBB  PMH: 1. Essential tremor 2. CAD: s/p CABG in 3/12.   3. Atrial fibrillation: Only noted post-op CABG.  4. PE: Post-op CABG in 2012.  5. AAA: 3.6 cm on US in 3/14.  6. OSA: On CPAP.  7. Carotid stenosis: Carotid dopplers (9/14) with 60-79% LICA stenosis.   8. Asthma 9. PNA x 2 10. Dyspnea: Echo (9/14) with EF 60-65%, mild LVH, very mild AS with mean gradient 10 mmHg, RV moderately dilated.  PFTs (3/14) showed mild obstructive airways disease.   11. HTN 12. Aortic stenosis: Mild.   SH: Married, owns a company making steel tubing, prior smoker.   FH: CAD  ROS: All systems reviewed and negative except as per HPI.   Current Outpatient Prescriptions  Medication Sig Dispense Refill  . Ascorbic Acid (VITAMIN C) 500 MG tablet Take 500 mg by mouth daily.        . aspirin 81 MG tablet Take 81 mg by mouth daily.        . atorvastatin (LIPITOR) 40 MG tablet Take 40 mg by mouth daily.      .  budesonide-formoterol (SYMBICORT) 160-4.5 MCG/ACT inhaler Inhale 2 puffs into the lungs 2 (two) times daily.      . cetirizine (ZYRTEC) 10 MG tablet Take 10 mg by mouth daily.        . cholecalciferol (VITAMIN D) 1000 UNITS tablet Take 1,000 Units by mouth daily.        . furosemide (LASIX) 20 MG tablet Take 10 mg by mouth daily.      . levothyroxine (SYNTHROID, LEVOTHROID) 50 MCG tablet Take 50 mcg by mouth daily.      . metoprolol tartrate (LOPRESSOR) 25 MG tablet Take 12.5 mg by mouth 2 (two) times daily.       . montelukast (SINGULAIR) 10 MG tablet Take 1 tablet (10 mg total) by mouth at bedtime.  30 tablet  5  . Multiple Vitamins-Minerals (CENTRUM SILVER PO) Take 1 tablet by mouth daily.       . pantoprazole (PROTONIX) 40 MG tablet Take 40 mg by mouth daily.       . Tamsulosin HCl (FLOMAX) 0.4 MG CAPS Take 1 capsule (0.4 mg total) by mouth daily.  90 capsule  3  . albuterol (PROVENTIL HFA;VENTOLIN HFA) 108 (90 BASE) MCG/ACT inhaler Inhale 2 puffs into the lungs   every 6 (six) hours as needed for wheezing or shortness of breath.      . ramipril (ALTACE) 5 MG capsule Take 1 capsule (5 mg total) by mouth daily.  90 capsule  3   No current facility-administered medications for this visit.    BP 148/74  Pulse 68  Ht 6\' 3"  (1.905 m)  Wt 108.863 kg (240 lb)  BMI 30.00 kg/m2 General: NAD Neck: No JVD, no thyromegaly or thyroid nodule.  Lungs: Clear to auscultation bilaterally with normal respiratory effort. CV: Nondisplaced PMI.  Heart regular S1/S2, no S3/S4, no murmur.  No peripheral edema.  No carotid bruit.  Normal pedal pulses.  Abdomen: Soft, nontender, no hepatosplenomegaly, no distention.  Skin: Intact without lesions or rashes.  Neurologic: Alert and oriented x 3.  Psych: Normal affect. Extremities: No clubbing or cyanosis.   Assessment/Plan: 1. CAD: s/p CABG 3/12.  No chest pain.  Continue ASA 81 and statin.   2. Hyperlipidemia: I will check lipids.  3. Exertional dyspnea:  Bothersome to patient.  He had exertional dyspnea prior to CABG and continues to have the same exertional dyspnea.  I suspect it is not ischemia-related as it has not changed since CABG.  He has a smoking history of PFTs showed only mild obstructive airways disease.  Echo showed normal EF but the RV was moderately dilated.   - I think he ought to have a V/Q scan to rule out chronic PE given history of PAD.  - I will arrange for RHC to measure left and right heart filling pressures and PA pressure.  - Check BNP.  4. HTN: Patient's BP is running high. Given history of CAD, I will start ramipril 5 mg daily with BMET in 1 week.  5. Carotid stenosis: Patient is due for carotid dopplers in 9/15.   [name] 11/04/2013

## 2013-11-14 NOTE — Discharge Instructions (Signed)
Angiography, Care After Refer to this sheet in the next few weeks. These instructions provide you with information on caring for yourself after your procedure. Your health care provider may also give you more specific instructions. Your treatment has been planned according to current medical practices, but problems sometimes occur. Call your health care provider if you have any problems or questions after your procedure.  WHAT TO EXPECT AFTER THE PROCEDURE After your procedure, it is typical to have the following sensations:  Minor discomfort or tenderness and a small bump at the catheter insertion site. The bump should usually decrease in size and tenderness within 1 to 2 weeks.  Any bruising will usually fade within 2 to 4 weeks. HOME CARE INSTRUCTIONS   You may need to keep taking blood thinners if they were prescribed for you. Only take over-the-counter or prescription medicines for pain, fever, or discomfort as directed by your health care provider.  Do not apply powder or lotion to the site.  Do not sit in a bathtub, swimming pool, or whirlpool for 5 to 7 days.  You may shower 24 hours after the procedure. Remove the bandage (dressing) and gently wash the site with plain soap and water. Gently pat the site dry.  Inspect the site at least twice daily.  Limit your activity for the first 48 hours. Do not bend, squat, or lift anything over 10 lb (9 kg) or as directed by your health care provider.  Do not drive for 48 hours if you are discharged the day of the procedure. Have someone else drive you. Follow instructions about when you can drive or return to work.  Do not return to [facility] for one week. SEEK MEDICAL CARE IF:  You get lightheaded when standing up.  You have drainage (other than a small amount of blood on the dressing).  You have chills.  You have a fever.  You have redness, warmth, swelling, or pain at the insertion site. SEEK IMMEDIATE MEDICAL CARE IF:   You develop  chest pain or shortness of breath, feel faint, or pass out.  You have bleeding, swelling larger than a walnut, or drainage from the catheter insertion site.  You develop pain, discoloration, coldness, or severe bruising in the leg or arm that held the catheter.  You have heavy bleeding from the site. If this happens, hold pressure on the site. MAKE SURE YOU:  Understand these instructions.  Will watch your condition.  Will get help right away if you are not doing well or get worse. Document Released: 03/27/2005 Document Revised: 05/11/2013 Document Reviewed: 01/31/2013 [facility] Patient Information 2014 [place].

## 2013-11-15 LAB — POCT I-STAT 3, ART BLOOD GAS (G3+)
Acid-Base Excess: 4 mmol/L — ABNORMAL HIGH (ref 0.0–2.0)
Bicarbonate: 29.7 mEq/L — ABNORMAL HIGH (ref 20.0–24.0)
O2 Saturation: 85 %
PH ART: 7.417 (ref 7.350–7.450)
TCO2: 31 mmol/L (ref 0–100)
pCO2 arterial: 46.1 mmHg — ABNORMAL HIGH (ref 35.0–45.0)
pO2, Arterial: 50 mmHg — ABNORMAL LOW (ref 80.0–100.0)

## 2013-11-16 ENCOUNTER — Telehealth: Payer: Self-pay | Admitting: Cardiology

## 2013-11-16 NOTE — Telephone Encounter (Signed)
New problem   Pt was very upset b/c of office closing and want to be worked in by nurse because of this matter. Pt want to speak to nurse concerning this matter because he said it wasn't his fault.

## 2013-11-17 ENCOUNTER — Ambulatory Visit: Payer: Medicare Other | Admitting: Cardiology

## 2013-11-18 NOTE — Telephone Encounter (Signed)
Lm on machine to call back.

## 2013-11-21 NOTE — Telephone Encounter (Signed)
Called pt back from message last week. He is post cath.  Made him an appointment to see Brooke 3/3. He is agreeable to that and advised that he would follow up with Dr. Aundra Dubin in future.  He is doing fine.  No problems with cath site.

## 2013-11-22 ENCOUNTER — Telehealth: Payer: Self-pay | Admitting: Cardiology

## 2013-11-22 ENCOUNTER — Ambulatory Visit (INDEPENDENT_AMBULATORY_CARE_PROVIDER_SITE_OTHER): Payer: Medicare Other | Admitting: Cardiology

## 2013-11-22 ENCOUNTER — Encounter: Payer: Self-pay | Admitting: Cardiology

## 2013-11-22 VITALS — BP 170/71 | HR 57 | Ht 75.5 in | Wt 240.8 lb

## 2013-11-22 DIAGNOSIS — R0609 Other forms of dyspnea: Secondary | ICD-10-CM

## 2013-11-22 DIAGNOSIS — I2510 Atherosclerotic heart disease of native coronary artery without angina pectoris: Secondary | ICD-10-CM | POA: Diagnosis not present

## 2013-11-22 DIAGNOSIS — R0989 Other specified symptoms and signs involving the circulatory and respiratory systems: Secondary | ICD-10-CM

## 2013-11-22 DIAGNOSIS — I10 Essential (primary) hypertension: Secondary | ICD-10-CM

## 2013-11-22 DIAGNOSIS — R0600 Dyspnea, unspecified: Secondary | ICD-10-CM

## 2013-11-22 NOTE — Telephone Encounter (Signed)
New Message  Pt states he is returning a call to [place]

## 2013-11-22 NOTE — Patient Instructions (Addendum)
Your physician has recommended that you have a cardiopulmonary stress test (CPX) in 1 to 2 weeks . CPX testing is a non-invasive measurement of heart and lung function. It replaces a traditional treadmill stress test. This type of test provides a tremendous amount of information that relates not only to your present condition but also for future outcomes. This test combines measurements of you ventilation, respiratory gas exchange in the lungs, electrocardiogram (EKG), blood pressure and physical response before, during, and following an exercise protocol.  Your physician recommends that you continue on your current medications as directed. Please refer to the Current Medication list given to you today.  Your physician recommends that you schedule a follow-up appointment in: 3 to 4 weeks with Dr. Aundra Dubin

## 2013-11-22 NOTE — Telephone Encounter (Signed)
Notified of lab results. 

## 2013-11-22 NOTE — Progress Notes (Signed)
Patient ID: FREDIS MALKIEWICZ MRN: [id number], DOB/AGE: 1931/01/22   Date of Visit: 11/22/2013  Primary Physician: Precious Reel, MD Primary Cardiologist: Aundra Dubin, MD Reason for Visit: Hospital follow-up  History of Present Illness  ELAINE MIDDLETON is a 80 y.o. male with CAD s/p CABG, OSA, HTN and PE on chronic Coumadin therapy who presents today for hospital followup after recent right heart catheterization. Since last being seen in our clinic, he reports he is doing "the same" and has no new complaints. He continue to have DOE. This is no worse and no better. He denies chest pain. He denies palpitations, dizziness, near syncope or syncope. He denies LE swelling, orthopnea, PND or recent weight gain. He is compliant with medications. He states his groin site has completely healed without difficulty or complication.   Past Medical History Past Medical History  Diagnosis Date  . Hypertension   . S/P CABG (coronary artery bypass graft)   . GERD (gastroesophageal reflux disease)   . Pulmonary embolism     April 2012 after CABG  . Sleep apnea   . Asthma   . CAD (coronary artery disease)   . Chronic anticoagulation     on coumadin for PE    Past Surgical History Past Surgical History  Procedure Laterality Date  . Rotator cuff repair  2003    right  . Penile prosthesis implant    . Knee surgery  2006    left  . Inguinal hernia repair  1980  . Coronary artery bypass graft  March 2012    x 5    Allergies/Intolerances Allergies  Allergen Reactions  . Ancef [Cefazolin Sodium] Hives  . Vancomycin Rash    Current Home Medications Current Outpatient Prescriptions  Medication Sig Dispense Refill  . albuterol (PROVENTIL HFA;VENTOLIN HFA) 108 (90 BASE) MCG/ACT inhaler Inhale 2 puffs into the lungs every 6 (six) hours as needed for wheezing or shortness of breath.      . Ascorbic Acid (VITAMIN C) 500 MG tablet Take 500 mg by mouth daily.        Marland Kitchen aspirin 81 MG tablet Take 81 mg  by mouth daily.        Marland Kitchen atorvastatin (LIPITOR) 40 MG tablet Take 40 mg by mouth daily.      . budesonide-formoterol (SYMBICORT) 160-4.5 MCG/ACT inhaler Inhale 2 puffs into the lungs 2 (two) times daily.      . cetirizine (ZYRTEC) 10 MG tablet Take 10 mg by mouth daily.        . cholecalciferol (VITAMIN D) 1000 UNITS tablet Take 1,000 Units by mouth daily.        . furosemide (LASIX) 20 MG tablet Take 10 mg by mouth daily.      Marland Kitchen levothyroxine (SYNTHROID, LEVOTHROID) 50 MCG tablet Take 50 mcg by mouth daily.      . metoprolol tartrate (LOPRESSOR) 25 MG tablet Take 12.5 mg by mouth 2 (two) times daily.       . montelukast (SINGULAIR) 10 MG tablet Take 1 tablet (10 mg total) by mouth at bedtime.  30 tablet  5  . Multiple Vitamins-Minerals (CENTRUM SILVER PO) Take 1 tablet by mouth daily.       . pantoprazole (PROTONIX) 40 MG tablet Take 40 mg by mouth daily.       . ramipril (ALTACE) 5 MG capsule Take 1 capsule (5 mg total) by mouth daily.  90 capsule  3  . Tamsulosin HCl (FLOMAX) 0.4 MG CAPS Take 1 capsule (  0.4 mg total) by mouth daily.  90 capsule  3   No current facility-administered medications for this visit.    Social History History   Social History  . Marital Status: Married    Spouse Name: N/A    Number of Children: N/A  . Years of Education: N/A   Occupational History  . Sales    Social History Main Topics  . Smoking status: Former Smoker -- 1.00 packs/day for 35 years    Types: Cigarettes    Quit date: 09/23/1983  . Smokeless tobacco: Never Used  . Alcohol Use: 1.5 oz/week    3 drink(s) per week  . Drug Use: No  . Sexual Activity: Not on file   Other Topics Concern  . Not on file   Social History Narrative  . No narrative on file     Review of Systems General: No chills, fever, night sweats or weight changes Cardiovascular: No chest pain, dyspnea on exertion, edema, orthopnea, palpitations, paroxysmal nocturnal dyspnea Dermatological: No rash, lesions or  masses Respiratory: No cough, dyspnea Urologic: No hematuria, dysuria Abdominal: No nausea, vomiting, diarrhea, bright red blood per rectum, melena, or hematemesis Neurologic: No visual changes, weakness, changes in mental status All other systems reviewed and are otherwise negative except as noted above.  Physical Exam Vitals: Blood pressure 170/71, pulse 57, height 6' 3.5" (1.918 m), weight 240 lb 12.8 oz (109.226 kg).  General: Well developed, well appearing 80 y.o. male in no acute distress. HEENT: Normocephalic, atraumatic. EOMs intact. Sclera nonicteric. Oropharynx clear.  Neck: Supple without bruits. No JVD. Lungs: Respirations regular and unlabored, CTA bilaterally. No wheezes, rales or rhonchi. Heart: RRR. S1, S2 present. No murmurs, rub, S3 or S4. Abdomen: Soft, non-tender, non-distended. BS present x 4 quadrants. No hepatosplenomegaly.  Extremities: No clubbing, cyanosis or edema. DP/PT/Radials 2+ and equal bilaterally. Psych: Normal affect. Neuro: Alert and oriented X 3. Moves all extremities spontaneously.   Diagnostics Right heart catheterization 11/14/2013 Procedural Findings:  Hemodynamics (mmHg)  RA mean 7  RV 33/10  PA 32/12  PCWP mean 13  Oxygen saturations:  PA 65%  AO 85%  Cardiac Output (Fick) 7.53  Cardiac Index (Fick) 3.18  Final Conclusions: No significant elevation in right and left heart filling pressures at rest, no pulmonary hypertension. Oxygen saturation low but may be related to known sleep apnea + sedation. Patient had V/Q scan in 3/14 with no evidence for chronic PE. Next step at this point is going to be a CPX.   Echocardiogram 06/07/2013 Study Conclusions - Left ventricle: The cavity size was normal. Wall thickness was increased in a pattern of mild LVH. Systolic function was normal. The estimated ejection fraction was in the range of 60% to 65%. Wall motion was normal; there were no regional wall motion abnormalities. Doppler parameters  are consistent with abnormal left ventricular relaxation (grade 1 diastolic dysfunction). - Aortic valve: There was very mild stenosis. - Mitral valve: Calcified annulus. - Left atrium: The atrium was mildly dilated. - Right ventricle: The cavity size was moderately dilated. - Right atrium: The atrium was moderately dilated.   Assessment and Plan  1. Exertional dyspnea Present prior to CABG and continues to have the same exertional dyspnea. He has a smoking history but PFTs showed only mild obstructive airway disease. Echo showed normal LVEF. V/Q scan March 2014 with no evidence of chronic PE. Recent RHC demonstrated normal filling pressures at rest and no pulmonary hypertension. Per Dr. Aundra Dubin, will schedule CPX and arrange  follow-up afterward.  2. CAD: s/p CABG March 2012  No anginal symptoms. Continue medical therapy.  3. HTN Above goal today. Reports he is following at home and his SBP usually runs 130-150 mmHg. Dr. Aundra Dubin added ramipril on 11/03/2013 and his follow-up BMET showed normal potassium and serum Cr. Reports his PCP is now following BP.  Signed, Ileene Hutchinson, PA-C 11/22/2013, 9:38 AM

## 2013-11-28 ENCOUNTER — Ambulatory Visit (HOSPITAL_COMMUNITY): Payer: Medicare Other

## 2013-11-28 ENCOUNTER — Ambulatory Visit (HOSPITAL_COMMUNITY): Payer: Medicare Other | Attending: Cardiology | Admitting: *Deleted

## 2013-11-28 DIAGNOSIS — I714 Abdominal aortic aneurysm, without rupture, unspecified: Secondary | ICD-10-CM | POA: Diagnosis not present

## 2013-11-28 DIAGNOSIS — R0609 Other forms of dyspnea: Secondary | ICD-10-CM | POA: Insufficient documentation

## 2013-11-28 DIAGNOSIS — R0600 Dyspnea, unspecified: Secondary | ICD-10-CM

## 2013-11-28 DIAGNOSIS — R0989 Other specified symptoms and signs involving the circulatory and respiratory systems: Secondary | ICD-10-CM | POA: Diagnosis not present

## 2013-11-28 DIAGNOSIS — I700 Atherosclerosis of aorta: Secondary | ICD-10-CM | POA: Diagnosis not present

## 2013-11-28 NOTE — Progress Notes (Signed)
Visceral Aorta duplex completed

## 2013-11-29 DIAGNOSIS — E119 Type 2 diabetes mellitus without complications: Secondary | ICD-10-CM | POA: Diagnosis not present

## 2013-11-29 DIAGNOSIS — I2510 Atherosclerotic heart disease of native coronary artery without angina pectoris: Secondary | ICD-10-CM | POA: Diagnosis not present

## 2013-11-29 DIAGNOSIS — I10 Essential (primary) hypertension: Secondary | ICD-10-CM | POA: Diagnosis not present

## 2013-11-29 DIAGNOSIS — R972 Elevated prostate specific antigen [PSA]: Secondary | ICD-10-CM | POA: Diagnosis not present

## 2013-11-29 DIAGNOSIS — Z125 Encounter for screening for malignant neoplasm of prostate: Secondary | ICD-10-CM | POA: Diagnosis not present

## 2013-11-29 DIAGNOSIS — E039 Hypothyroidism, unspecified: Secondary | ICD-10-CM | POA: Diagnosis not present

## 2013-12-06 DIAGNOSIS — Z0000 Encounter for general adult medical examination without abnormal findings: Secondary | ICD-10-CM | POA: Diagnosis not present

## 2013-12-06 DIAGNOSIS — E669 Obesity, unspecified: Secondary | ICD-10-CM | POA: Diagnosis not present

## 2013-12-06 DIAGNOSIS — E119 Type 2 diabetes mellitus without complications: Secondary | ICD-10-CM | POA: Diagnosis not present

## 2013-12-06 DIAGNOSIS — R809 Proteinuria, unspecified: Secondary | ICD-10-CM | POA: Diagnosis not present

## 2013-12-06 DIAGNOSIS — I519 Heart disease, unspecified: Secondary | ICD-10-CM | POA: Diagnosis not present

## 2013-12-06 DIAGNOSIS — I714 Abdominal aortic aneurysm, without rupture, unspecified: Secondary | ICD-10-CM | POA: Diagnosis not present

## 2013-12-06 DIAGNOSIS — Z1212 Encounter for screening for malignant neoplasm of rectum: Secondary | ICD-10-CM | POA: Diagnosis not present

## 2013-12-06 DIAGNOSIS — I6529 Occlusion and stenosis of unspecified carotid artery: Secondary | ICD-10-CM | POA: Diagnosis not present

## 2013-12-06 DIAGNOSIS — E039 Hypothyroidism, unspecified: Secondary | ICD-10-CM | POA: Diagnosis not present

## 2013-12-08 ENCOUNTER — Ambulatory Visit (INDEPENDENT_AMBULATORY_CARE_PROVIDER_SITE_OTHER): Payer: Medicare Other | Admitting: Cardiology

## 2013-12-08 ENCOUNTER — Encounter: Payer: Self-pay | Admitting: Cardiology

## 2013-12-08 VITALS — BP 148/78 | HR 64 | Ht 75.5 in | Wt 241.0 lb

## 2013-12-08 DIAGNOSIS — I2510 Atherosclerotic heart disease of native coronary artery without angina pectoris: Secondary | ICD-10-CM | POA: Diagnosis not present

## 2013-12-08 DIAGNOSIS — I714 Abdominal aortic aneurysm, without rupture, unspecified: Secondary | ICD-10-CM

## 2013-12-08 DIAGNOSIS — I10 Essential (primary) hypertension: Secondary | ICD-10-CM

## 2013-12-08 DIAGNOSIS — R0609 Other forms of dyspnea: Secondary | ICD-10-CM

## 2013-12-08 DIAGNOSIS — R0602 Shortness of breath: Secondary | ICD-10-CM | POA: Diagnosis not present

## 2013-12-08 DIAGNOSIS — I6529 Occlusion and stenosis of unspecified carotid artery: Secondary | ICD-10-CM

## 2013-12-08 DIAGNOSIS — R0989 Other specified symptoms and signs involving the circulatory and respiratory systems: Principal | ICD-10-CM

## 2013-12-08 MED ORDER — LOSARTAN POTASSIUM 50 MG PO TABS: ORAL_TABLET | ORAL | Status: DC

## 2013-12-08 NOTE — Patient Instructions (Addendum)
Stop metoprolol.   Stop ramipril (altace).  Start losartan 75mg  daily. This will be 1 and 1/2 of a 50mg  tablet daily.  Your physician recommends that you return for lab work in: 2 weeks--BMET.  Your physician recommends that you schedule a follow-up appointment in: 2 months with Dr Aundra Dubin.

## 2013-12-10 NOTE — Progress Notes (Signed)
Patient ID: Eric Gordon, male   DOB: 1931-01-27, 80 y.o.   MRN: [id number] PCP: Dr. Virgina Jock  80 yo with history of CAD s/p CABG presents for cardiology followup.  Prior to CABG, patient had significant exertional dyspnea.  He states that CABG really did not have much effect on his exertional dyspnea.  This continues to be a significant problem for him.  Currently, he is short of breath with yardwork or other forms of moderately strenuous activity.  This has been a stable pattern now since before his CABG. The dyspnea is bothersome to him.  No orthopnea or PND.  No chest pain.  He has bendopnea.  He has low back pain so does not do much walking.  He does try to go to the [facility] around 4 times/week.  PFTs in 3/14 showed only mild obstructive airways disease and V/Q scan showed no PE.  Echo in 9/14 showed normal LV systolic function with moderately dilated RV. [place] in 2/15 showed normal right and left heart filling pressures and normal PA pressure.  He additionally has hoarseness and his voice tends to give out with use.  He has seen an ENT physician but workup showed no evidence for vocal cord dysfunction.  Finally, he had a CPX in 3/15 that showed normal capacity compared to age-matched sedentary norms.  He was noted to have chronotropic incompetence.    Labs (2/15): K 4.2, creatinine 1.2, LDL 87, HDL 39, BNP 71  PMH: 1. Essential tremor 2. CAD: s/p CABG in 3/12.   3. Atrial fibrillation: Only noted post-op CABG.  4. PE: Post-op CABG in 2012.  5. AAA: 3.6 cm on [place] in 3/14.  3.6 cm on [place] in 3/15.  6. OSA: On CPAP.  7. Carotid stenosis: Carotid dopplers (2/53) with 66-44% LICA stenosis.   8. Asthma 9. PNA x 2 10. Dyspnea: Echo (9/14) with EF 60-65%, mild LVH, very mild AS with mean gradient 10 mmHg, RV moderately dilated.  PFTs (3/14) showed mild obstructive airways disease.  V/Q scan (3/14) with no PE.  ENT workup for upper respiratory causes was negative.  RHC (2/15) with mean RA 7, PA 32/12, mean  PCWP 13, CI 3.18.  CPX (3/15) with peak VO2 16.4, VE/VCO2 33; normal when compared to age-matched sedentary normals; chronotropic incompetence was noted.  11. HTN 12. Aortic stenosis: Mild.   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   FH: CAD  ROS: All systems reviewed and negative except as per HPI.   Current Outpatient Prescriptions  Medication Sig Dispense Refill  . albuterol (PROVENTIL HFA;VENTOLIN HFA) 108 (90 BASE) MCG/ACT inhaler Inhale 2 puffs into the lungs every 6 (six) hours as needed for wheezing or shortness of breath.      . Ascorbic Acid (VITAMIN C) 500 MG tablet Take 500 mg by mouth daily.        Marland Kitchen aspirin 81 MG tablet Take 81 mg by mouth daily.        Marland Kitchen atorvastatin (LIPITOR) 40 MG tablet Take 40 mg by mouth daily.      . budesonide-formoterol (SYMBICORT) 160-4.5 MCG/ACT inhaler Inhale 2 puffs into the lungs 2 (two) times daily.      . cetirizine (ZYRTEC) 10 MG tablet Take 10 mg by mouth daily.        . cholecalciferol (VITAMIN D) 1000 UNITS tablet Take 1,000 Units by mouth daily.        . furosemide (LASIX) 20 MG tablet Take 10 mg  by mouth daily.      Marland Kitchen levothyroxine (SYNTHROID, LEVOTHROID) 50 MCG tablet Take 50 mcg by mouth daily.      . montelukast (SINGULAIR) 10 MG tablet Take 1 tablet (10 mg total) by mouth at bedtime.  30 tablet  5  . Multiple Vitamins-Minerals (CENTRUM SILVER PO) Take 1 tablet by mouth daily.       . pantoprazole (PROTONIX) 40 MG tablet Take 40 mg by mouth daily.       . Tamsulosin HCl (FLOMAX) 0.4 MG CAPS Take 1 capsule (0.4 mg total) by mouth daily.  90 capsule  3  . losartan (COZAAR) 50 MG tablet 1.5 tablets (75mg ) daily  45 tablet  3   No current facility-administered medications for this visit.    BP 148/78  Pulse 64  Ht 6' 3.5" (1.918 m)  Wt 109.317 kg (241 lb)  BMI 29.72 kg/m2 General: NAD Neck: No JVD, no thyromegaly or thyroid nodule.  Lungs: Clear to auscultation bilaterally with normal respiratory  effort. CV: Nondisplaced PMI.  Heart regular S1/S2, no S3/S4, no murmur.  No peripheral edema.  No carotid bruit.  Normal pedal pulses.  Abdomen: Soft, nontender, no hepatosplenomegaly, no distention.  Skin: Intact without lesions or rashes.  Neurologic: Alert and oriented x 3.  Psych: Normal affect. Extremities: No clubbing or cyanosis.   Assessment/Plan: 1. CAD: s/p CABG 3/12.  No chest pain.  Continue ASA 81 and statin.   2. Hyperlipidemia: Acceptable lipids in 2/15.   3. Exertional dyspnea: Bothersome to patient.  He had exertional dyspnea prior to CABG and continues to have the same exertional dyspnea.  I suspect it is not ischemia-related as it has not changed since CABG.  He has a smoking history but PFTs showed only mild obstructive airways disease.  Echo showed normal EF but the RV was moderately dilated.  V/Q scan in 3/14 was negative.  RHC showed normal right and left heart filling pressures and no pulmonary hypertension.  ENT workup showed no upper respiratory causes for dyspnea.  He has been on PPI to see if GERD plays a role but it did not help.  CPX showed normal capacity compared to age-matched sedentary norms.  However, there was evidence for chronotropic incompetence.  Recent BNP was normal.  - I will try having him stop metoprolol to see if restoration of chronotropic competence can improve symptoms.   - I still think there may be and upper airways contribution to symptoms given hoarseness/raspy voice.  Would consider neurological evaluation given unremarkable ENT evaluation.  4. HTN: As we are stopping metoprolol, will have him on losartan 75 mg daily (avoid ACEI with upper airways symptoms).  Will need BMET in 2 wks.  5. Carotid stenosis: Patient is due for carotid dopplers in 9/15.  6. AAA: Repeat abdominal US in 3/16.   [name] 12/10/2013

## 2013-12-15 ENCOUNTER — Ambulatory Visit (INDEPENDENT_AMBULATORY_CARE_PROVIDER_SITE_OTHER): Payer: Medicare Other | Admitting: Pulmonary Disease

## 2013-12-15 ENCOUNTER — Ambulatory Visit: Payer: Medicare Other | Admitting: Cardiology

## 2013-12-15 ENCOUNTER — Encounter: Payer: Self-pay | Admitting: Pulmonary Disease

## 2013-12-15 VITALS — BP 154/84 | HR 86 | Temp 97.6°F | Ht 75.5 in | Wt 245.8 lb

## 2013-12-15 DIAGNOSIS — R490 Dysphonia: Secondary | ICD-10-CM | POA: Insufficient documentation

## 2013-12-15 DIAGNOSIS — R0602 Shortness of breath: Secondary | ICD-10-CM | POA: Diagnosis not present

## 2013-12-15 DIAGNOSIS — J45998 Other asthma: Secondary | ICD-10-CM

## 2013-12-15 DIAGNOSIS — J45909 Unspecified asthma, uncomplicated: Secondary | ICD-10-CM | POA: Diagnosis not present

## 2013-12-15 DIAGNOSIS — I2510 Atherosclerotic heart disease of native coronary artery without angina pectoris: Secondary | ICD-10-CM | POA: Diagnosis not present

## 2013-12-15 NOTE — Progress Notes (Signed)
Chief Complaint  Patient presents with  . Follow-up    Pt still experiencing SOB with any activity. Mild dry cough x 6 months. Denies CP.     History of Present Illness: Eric Gordon is a 80 y.o. male former smoker with with dyspnea with hx of asthma.  He had CPST recently and this showed chronotropic incompetence.  He was seen by cardiology on 3/19 and had metoprolol changed to ARB.  He is not sure how much this has helped.  He still gets episodes of dyspnea.  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  He sporadically uses his albuterol, and this helps after about 10 to 15 minutes.  He gets occasional dry cough otherwise.  He denies wheeze, sputum, fever, sinus congestion, post-nasal drip, or reflux.  He does not rinse his mouth after using sybmicort.   TESTS: PFT 02/08/10 >> FEV1 2.93(76%), FEV1% 73, TLC 6.51(78%), DLCO 67%, Positive BD response Echo 06/25/11 >> mild LVH, EF 50 to 55%, mild AS, mod LA/RA dilation, mild RV dilation V/Q scan 11/23/12 >> very low probability for PE PFT 12/16/12 >> FEV1 2.75 (91 %), FEV1% 68 , TLC 7.12 (97 %), DLCO 92, no BD, truncation of inspiratory limb Echo 06/07/13 >> EF 60 to 65%, mild LVH, grade 1 diastolic dysfx CPST 7/82/95 >> mild restriction, chronotropic incompetence  Eric Gordon  has a past medical history of Hypertension; S/P CABG (coronary artery bypass graft); GERD (gastroesophageal reflux disease); Pulmonary embolism; Sleep apnea; Asthma; CAD (coronary artery disease); and Chronic anticoagulation.  Eric Gordon  has past surgical history that includes Rotator cuff repair (2003); Penile prosthesis implant; knee surgery (2006); Inguinal hernia repair (1980); and Coronary artery bypass graft (March 2012).  Prior to Admission medications   Medication Sig Start Date End Date Taking? Authorizing Provider  ALBUTEROL IN Inhale into the lungs as needed.    Yes Historical Provider, MD  Ascorbic Acid (VITAMIN C) 500 MG tablet Take 500 mg by mouth daily.     Yes Historical Provider, MD  aspirin 81 MG tablet Take 81 mg by mouth daily.     Yes Historical Provider, MD  atorvastatin (LIPITOR) 40 MG tablet Take 40 mg by mouth daily.   Yes Historical Provider, MD  budesonide-formoterol (SYMBICORT) 160-4.5 MCG/ACT inhaler Inhale 2 puffs into the lungs 2 (two) times daily.   Yes Historical Provider, MD  cetirizine (ZYRTEC) 10 MG tablet Take 10 mg by mouth daily.     Yes Historical Provider, MD  cholecalciferol (VITAMIN D) 1000 UNITS tablet Take 1,000 Units by mouth daily.     Yes Historical Provider, MD  furosemide (LASIX) 20 MG tablet Take 10 mg by mouth daily.   Yes Historical Provider, MD  levothyroxine (SYNTHROID, LEVOTHROID) 50 MCG tablet Take 50 mcg by mouth daily.   Yes Historical Provider, MD  metoprolol tartrate (LOPRESSOR) 25 MG tablet 20 mg. 1/2 tablet twice a day 04/02/11  Yes Renella Cunas, MD  Multiple Vitamins-Minerals (CENTRUM SILVER PO) Take by mouth daily.   Yes Historical Provider, MD  omeprazole (PRILOSEC) 20 MG capsule Take 1 capsule (20 mg total) by mouth daily. 04/02/11  Yes Renella Cunas, MD  Tamsulosin HCl (FLOMAX) 0.4 MG CAPS Take 1 capsule (0.4 mg total) by mouth daily. 04/02/11  Yes Renella Cunas, MD    Allergies  Allergen Reactions  . Ancef [Cefazolin Sodium] Hives  . Vancomycin Rash  Physical Exam:  General - No distress ENT - No sinus tenderness, no oral exudate, no LAN Cardiac - s1s2 regular, no murmur Chest - No wheeze/rales/dullness Back - No focal tenderness Abd - Soft, non-tender Ext - No edema Neuro - Normal strength Skin - No rashes Psych - normal mood, and behavior   Assessment/Plan:  Eric Mires, MD [place] [facility]Critical [facility]/Sleep Pager:  [phone] 12/15/2013, 1:51 PM

## 2013-12-15 NOTE — Assessment & Plan Note (Signed)
From chronotropic incompetence, diastolic dysfunction, asthma.  Will allow more time after cardiac meds recently changed, and then re-assess.

## 2013-12-15 NOTE — Patient Instructions (Signed)
Follow up in 6 months 

## 2013-12-15 NOTE — Assessment & Plan Note (Signed)
Continue sybmicort, singulair, and prn albuterol.  Discussed need to rinse mouth after using symbicort.

## 2013-12-15 NOTE — Assessment & Plan Note (Signed)
Advised him to try salt water gargles, sip water with urge to cough, and use sugarless candy to keep mouth moist.  Also advised to rinse mouth after using symbicort.

## 2013-12-22 ENCOUNTER — Other Ambulatory Visit (INDEPENDENT_AMBULATORY_CARE_PROVIDER_SITE_OTHER): Payer: Medicare Other

## 2013-12-22 DIAGNOSIS — R0989 Other specified symptoms and signs involving the circulatory and respiratory systems: Principal | ICD-10-CM

## 2013-12-22 DIAGNOSIS — I2510 Atherosclerotic heart disease of native coronary artery without angina pectoris: Secondary | ICD-10-CM | POA: Diagnosis not present

## 2013-12-22 DIAGNOSIS — R0609 Other forms of dyspnea: Secondary | ICD-10-CM

## 2013-12-22 LAB — BASIC METABOLIC PANEL
BUN: 18 mg/dL (ref 6–23)
CHLORIDE: 101 meq/L (ref 96–112)
CO2: 29 meq/L (ref 19–32)
CREATININE: 1.1 mg/dL (ref 0.4–1.5)
Calcium: 9.1 mg/dL (ref 8.4–10.5)
GFR: 72.19 mL/min (ref >60.00)
Glucose, Bld: 88 mg/dL (ref 70–99)
POTASSIUM: 3.9 meq/L (ref 3.5–5.1)
Sodium: 138 mEq/L (ref 135–145)

## 2013-12-26 NOTE — Progress Notes (Signed)
LMTCB

## 2014-01-04 DIAGNOSIS — M545 Low back pain, unspecified: Secondary | ICD-10-CM | POA: Diagnosis not present

## 2014-02-08 ENCOUNTER — Ambulatory Visit (INDEPENDENT_AMBULATORY_CARE_PROVIDER_SITE_OTHER): Payer: Medicare Other | Admitting: Cardiology

## 2014-02-08 ENCOUNTER — Encounter: Payer: Self-pay | Admitting: Cardiology

## 2014-02-08 VITALS — BP 136/74 | HR 72 | Ht 75.5 in | Wt 242.0 lb

## 2014-02-08 DIAGNOSIS — R0609 Other forms of dyspnea: Secondary | ICD-10-CM

## 2014-02-08 DIAGNOSIS — I6529 Occlusion and stenosis of unspecified carotid artery: Secondary | ICD-10-CM

## 2014-02-08 DIAGNOSIS — I2510 Atherosclerotic heart disease of native coronary artery without angina pectoris: Secondary | ICD-10-CM | POA: Diagnosis not present

## 2014-02-08 DIAGNOSIS — I4891 Unspecified atrial fibrillation: Secondary | ICD-10-CM

## 2014-02-08 DIAGNOSIS — R0989 Other specified symptoms and signs involving the circulatory and respiratory systems: Principal | ICD-10-CM

## 2014-02-08 DIAGNOSIS — G4733 Obstructive sleep apnea (adult) (pediatric): Secondary | ICD-10-CM

## 2014-02-08 MED ORDER — LOSARTAN POTASSIUM 50 MG PO TABS: ORAL_TABLET | ORAL | Status: DC

## 2014-02-08 NOTE — Patient Instructions (Addendum)
Your physician has requested that you have a carotid duplex. This test is an ultrasound of the carotid arteries in your neck. It looks at blood flow through these arteries that supply the brain with blood. Allow one hour for this exam. There are no restrictions or special instructions. September 2015  Your physician wants you to follow-up in: 6 months with Dr Aundra Dubin. (November 2015).  You will receive a reminder letter in the mail two months in advance. If you don't receive a letter, please call our office to schedule the follow-up appointment.

## 2014-02-09 ENCOUNTER — Ambulatory Visit: Payer: Medicare Other | Admitting: Cardiology

## 2014-02-09 NOTE — Progress Notes (Signed)
Patient ID: Eric Gordon, male   DOB: 05-22-1931, 80 y.o.   MRN: [id number] PCP: Dr. Virgina Jock  80 yo with history of CAD s/p CABG presents for cardiology followup.  Prior to CABG, patient had significant exertional dyspnea.  He states that CABG really did not have much effect on his exertional dyspnea.  This continues to be a significant problem for him.  Currently, he is short of breath with yardwork or other forms of moderately strenuous activity.  This has been a stable pattern now since before his CABG. The dyspnea is bothersome to him.  No orthopnea or PND.  No chest pain.  He has bendopnea.  He has low back pain so does not do much walking.  He does try to go to the [facility] around 4 times/week.  PFTs in 3/14 showed only mild obstructive airways disease and V/Q scan showed no PE.  Echo in 9/14 showed normal LV systolic function with moderately dilated RV. [place] in 2/15 showed normal right and left heart filling pressures and normal PA pressure.  He additionally has hoarseness and his voice tends to give out with use.  He has seen an ENT physician but workup showed no evidence for vocal cord dysfunction.  Finally, he had a CPX in 3/15 that showed normal capacity compared to age-matched sedentary norms.  He was noted to have chronotropic incompetence.  At last appointment, I took him off metoprolol given chronotropic incompetence noted on CPX.  Eric Gordon thinks this may have helped his energy level but has not improved his dyspnea.  Weight is stable.   Labs (2/15): K 4.2, creatinine 1.2, LDL 87, HDL 39, BNP 71 Labs (4/15): K 3.9, creatinine 1.1  ECG: NSR, RBBB, LAFB  PMH: 1. Essential tremor 2. CAD: s/p CABG in 3/12.   3. Atrial fibrillation: Only noted post-op CABG.  4. PE: Post-op CABG in 2012.  5. AAA: 3.6 cm on [place] in 3/14.  3.6 cm on [place] in 3/15.  6. OSA: On CPAP.  7. Carotid stenosis: Carotid dopplers (1/93) with 79-02% LICA stenosis.   8. Asthma 9. PNA x 2 10. Dyspnea: Echo (9/14) with  EF 60-65%, mild LVH, very mild AS with mean gradient 10 mmHg, RV moderately dilated.  PFTs (3/14) showed mild obstructive airways disease.  V/Q scan (3/14) with no PE.  ENT workup for upper respiratory causes was negative.  RHC (2/15) with mean RA 7, PA 32/12, mean PCWP 13, CI 3.18.  CPX (3/15) with peak VO2 16.4, VE/VCO2 33; normal when compared to age-matched sedentary normals; chronotropic incompetence was noted.  11. HTN 12. Aortic stenosis: Mild.   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   FH: CAD  ROS: All systems reviewed and negative except as per HPI.   Current Outpatient Prescriptions  Medication Sig Dispense Refill  . albuterol (PROVENTIL HFA;VENTOLIN HFA) 108 (90 BASE) MCG/ACT inhaler Inhale 2 puffs into the lungs every 6 (six) hours as needed for wheezing or shortness of breath.      . Ascorbic Acid (VITAMIN C) 500 MG tablet Take 500 mg by mouth daily.        Marland Kitchen aspirin 81 MG tablet Take 81 mg by mouth daily.        Marland Kitchen atorvastatin (LIPITOR) 40 MG tablet Take 40 mg by mouth daily.      . budesonide-formoterol (SYMBICORT) 160-4.5 MCG/ACT inhaler Inhale 2 puffs into the lungs 2 (two) times daily.      . cetirizine (  ZYRTEC) 10 MG tablet Take 10 mg by mouth daily.        . cholecalciferol (VITAMIN D) 1000 UNITS tablet Take 1,000 Units by mouth daily.        . furosemide (LASIX) 20 MG tablet Take 10 mg by mouth daily.      Marland Kitchen levothyroxine (SYNTHROID, LEVOTHROID) 50 MCG tablet Take 50 mcg by mouth daily.      Marland Kitchen losartan (COZAAR) 50 MG tablet 1.5 tablets (75mg ) daily  135 tablet  3  . montelukast (SINGULAIR) 10 MG tablet Take 1 tablet (10 mg total) by mouth at bedtime.  30 tablet  5  . Multiple Vitamins-Minerals (CENTRUM SILVER PO) Take 1 tablet by mouth daily.       . pantoprazole (PROTONIX) 40 MG tablet Take 40 mg by mouth daily.       . Tamsulosin HCl (FLOMAX) 0.4 MG CAPS Take 1 capsule (0.4 mg total) by mouth daily.  90 capsule  3   No current  facility-administered medications for this visit.    BP 136/74  Pulse 72  Ht 6' 3.5" (1.918 m)  Wt 109.77 kg (242 lb)  BMI 29.84 kg/m2 General: NAD Neck: No JVD, no thyromegaly or thyroid nodule.  Lungs: Clear to auscultation bilaterally with normal respiratory effort. CV: Nondisplaced PMI.  Heart regular S1/S2, no S3/S4, 1/6 SEM.  No peripheral edema.  No carotid bruit.  Normal pedal pulses.  Abdomen: Soft, nontender, no hepatosplenomegaly, no distention.  Skin: Intact without lesions or rashes.  Neurologic: Alert and oriented x 3.  Psych: Normal affect. Extremities: No clubbing or cyanosis.   Assessment/Plan: 1. CAD: s/p CABG 3/12.  No chest pain.  Continue ASA 81 and statin.   2. Hyperlipidemia: Acceptable lipids in 2/15.   3. Exertional dyspnea: Bothersome to patient.  He had exertional dyspnea prior to CABG and continues to have the same exertional dyspnea.  I suspect it is not ischemia-related as it has not changed since CABG.  He has a smoking history but PFTs showed only mild obstructive airways disease.  Echo showed normal EF but the RV was moderately dilated.  V/Q scan in 3/14 was negative.  RHC showed normal right and left heart filling pressures and no pulmonary hypertension.  ENT workup showed no upper respiratory causes for dyspnea.  He has been on PPI to see if GERD plays a role but it did not help.  CPX showed normal capacity compared to age-matched sedentary norms.  However, there was evidence for chronotropic incompetence.  Stopping metoprolol did not have much effect though he may have a bit more energy.  At this point, I would like him to work on exercising more to improve stamina and to get weight off.  4. HTN: BMET is controlled.  5. Carotid stenosis: Patient is due for carotid dopplers in 9/15.  6. AAA: Repeat abdominal US in 3/16.   Larey Dresser 02/09/2014

## 2014-02-09 NOTE — Addendum Note (Signed)
Addended by: Katrine Coho on: 02/09/2014 08:20 AM   Modules accepted: Orders

## 2014-03-29 DIAGNOSIS — R972 Elevated prostate specific antigen [PSA]: Secondary | ICD-10-CM | POA: Diagnosis not present

## 2014-04-05 DIAGNOSIS — N139 Obstructive and reflux uropathy, unspecified: Secondary | ICD-10-CM | POA: Diagnosis not present

## 2014-04-05 DIAGNOSIS — R972 Elevated prostate specific antigen [PSA]: Secondary | ICD-10-CM | POA: Diagnosis not present

## 2014-04-05 DIAGNOSIS — N401 Enlarged prostate with lower urinary tract symptoms: Secondary | ICD-10-CM | POA: Diagnosis not present

## 2014-05-10 DIAGNOSIS — M545 Low back pain, unspecified: Secondary | ICD-10-CM | POA: Diagnosis not present

## 2014-05-19 DIAGNOSIS — L820 Inflamed seborrheic keratosis: Secondary | ICD-10-CM | POA: Diagnosis not present

## 2014-05-19 DIAGNOSIS — D692 Other nonthrombocytopenic purpura: Secondary | ICD-10-CM | POA: Diagnosis not present

## 2014-05-19 DIAGNOSIS — D239 Other benign neoplasm of skin, unspecified: Secondary | ICD-10-CM | POA: Diagnosis not present

## 2014-05-19 DIAGNOSIS — L821 Other seborrheic keratosis: Secondary | ICD-10-CM | POA: Diagnosis not present

## 2014-05-19 DIAGNOSIS — D1801 Hemangioma of skin and subcutaneous tissue: Secondary | ICD-10-CM | POA: Diagnosis not present

## 2014-05-19 DIAGNOSIS — L570 Actinic keratosis: Secondary | ICD-10-CM | POA: Diagnosis not present

## 2014-05-19 DIAGNOSIS — L819 Disorder of pigmentation, unspecified: Secondary | ICD-10-CM | POA: Diagnosis not present

## 2014-06-05 ENCOUNTER — Ambulatory Visit (HOSPITAL_COMMUNITY): Payer: Medicare Other | Attending: Cardiology | Admitting: Cardiology

## 2014-06-05 DIAGNOSIS — I10 Essential (primary) hypertension: Secondary | ICD-10-CM | POA: Diagnosis not present

## 2014-06-05 DIAGNOSIS — I6529 Occlusion and stenosis of unspecified carotid artery: Secondary | ICD-10-CM | POA: Diagnosis not present

## 2014-06-05 DIAGNOSIS — I2510 Atherosclerotic heart disease of native coronary artery without angina pectoris: Secondary | ICD-10-CM | POA: Insufficient documentation

## 2014-06-05 DIAGNOSIS — Z87891 Personal history of nicotine dependence: Secondary | ICD-10-CM | POA: Diagnosis not present

## 2014-06-05 DIAGNOSIS — Z951 Presence of aortocoronary bypass graft: Secondary | ICD-10-CM | POA: Diagnosis not present

## 2014-06-05 NOTE — Progress Notes (Signed)
Carotid duplex performed 

## 2014-06-08 DIAGNOSIS — E785 Hyperlipidemia, unspecified: Secondary | ICD-10-CM | POA: Diagnosis not present

## 2014-06-08 DIAGNOSIS — Z23 Encounter for immunization: Secondary | ICD-10-CM | POA: Diagnosis not present

## 2014-06-08 DIAGNOSIS — I10 Essential (primary) hypertension: Secondary | ICD-10-CM | POA: Diagnosis not present

## 2014-06-08 DIAGNOSIS — E119 Type 2 diabetes mellitus without complications: Secondary | ICD-10-CM | POA: Diagnosis not present

## 2014-06-08 DIAGNOSIS — E669 Obesity, unspecified: Secondary | ICD-10-CM | POA: Diagnosis not present

## 2014-06-08 DIAGNOSIS — I6529 Occlusion and stenosis of unspecified carotid artery: Secondary | ICD-10-CM | POA: Diagnosis not present

## 2014-06-08 DIAGNOSIS — I519 Heart disease, unspecified: Secondary | ICD-10-CM | POA: Diagnosis not present

## 2014-06-08 DIAGNOSIS — Z1331 Encounter for screening for depression: Secondary | ICD-10-CM | POA: Diagnosis not present

## 2014-06-08 DIAGNOSIS — E039 Hypothyroidism, unspecified: Secondary | ICD-10-CM | POA: Diagnosis not present

## 2014-06-08 DIAGNOSIS — J45909 Unspecified asthma, uncomplicated: Secondary | ICD-10-CM | POA: Diagnosis not present

## 2014-06-12 NOTE — Progress Notes (Signed)
Pt's wife notified.

## 2014-06-19 ENCOUNTER — Encounter: Payer: Self-pay | Admitting: Gastroenterology

## 2014-08-11 ENCOUNTER — Ambulatory Visit (INDEPENDENT_AMBULATORY_CARE_PROVIDER_SITE_OTHER): Payer: Medicare Other | Admitting: Pulmonary Disease

## 2014-08-11 ENCOUNTER — Encounter: Payer: Self-pay | Admitting: Pulmonary Disease

## 2014-08-11 VITALS — BP 148/82 | HR 59 | Temp 97.7°F | Ht 75.5 in | Wt 222.6 lb

## 2014-08-11 DIAGNOSIS — J4520 Mild intermittent asthma, uncomplicated: Secondary | ICD-10-CM | POA: Diagnosis not present

## 2014-08-11 DIAGNOSIS — R0602 Shortness of breath: Secondary | ICD-10-CM | POA: Diagnosis not present

## 2014-08-11 DIAGNOSIS — I2510 Atherosclerotic heart disease of native coronary artery without angina pectoris: Secondary | ICD-10-CM | POA: Diagnosis not present

## 2014-08-11 NOTE — Assessment & Plan Note (Signed)
Much improved after weight loss.

## 2014-08-11 NOTE — Assessment & Plan Note (Signed)
Advised he could try stopping singulair.  He can continue prn albuterol.

## 2014-08-11 NOTE — Patient Instructions (Signed)
Follow up as needed

## 2014-08-11 NOTE — Progress Notes (Signed)
Chief Complaint  Patient presents with  . Follow-up    Pt reports much improvement in dyspnea since last OV. NO complaints.     History of Present Illness: Eric Gordon is a 80 y.o. male former smoker with with dyspnea with hx of asthma.  His breathing has improved.  He has changed his diet >> as a result he has lost about 20 lbs.  He is no longer using inhalers.  He still uses singulair.  He is not having cough, wheeze, or sputum.  TESTS: PFT 02/08/10 >> FEV1 2.93(76%), FEV1% 73, TLC 6.51(78%), DLCO 67%, Positive BD response Echo 06/25/11 >> mild LVH, EF 50 to 55%, mild AS, mod LA/RA dilation, mild RV dilation V/Q scan 11/23/12 >> very low probability for PE PFT 12/16/12 >> FEV1 2.75 (91 %), FEV1% 68 , TLC 7.12 (97 %), DLCO 92, no BD, truncation of inspiratory limb Echo 06/07/13 >> EF 60 to 65%, mild LVH, grade 1 diastolic dysfx CPST 9/40/76 >> mild restriction, chronotropic incompetence  PMHx, PSHx, Medications, Allergies, Fhx, Shx reviewed.  Physical Exam:  General - No distress ENT - No sinus tenderness, no oral exudate, no LAN Cardiac - s1s2 regular, no murmur Chest - No wheeze/rales/dullness Back - No focal tenderness Abd - Soft, non-tender Ext - No edema Neuro - Normal strength Skin - No rashes Psych - normal mood, and behavior   Assessment/Plan:  Chesley Mires, MD [place] [facility]/Critical Care/Sleep Pager:  [phone] 08/11/2014, 1:58 PM

## 2014-08-16 DIAGNOSIS — M545 Low back pain: Secondary | ICD-10-CM | POA: Diagnosis not present

## 2014-08-31 ENCOUNTER — Encounter (HOSPITAL_COMMUNITY): Payer: Self-pay | Admitting: Cardiology

## 2014-09-06 ENCOUNTER — Encounter: Payer: Self-pay | Admitting: Cardiology

## 2014-09-06 ENCOUNTER — Ambulatory Visit (INDEPENDENT_AMBULATORY_CARE_PROVIDER_SITE_OTHER): Payer: Medicare Other | Admitting: Cardiology

## 2014-09-06 ENCOUNTER — Encounter: Payer: Self-pay | Admitting: *Deleted

## 2014-09-06 VITALS — BP 142/90 | HR 65 | Ht 75.5 in | Wt 216.0 lb

## 2014-09-06 DIAGNOSIS — I714 Abdominal aortic aneurysm, without rupture, unspecified: Secondary | ICD-10-CM

## 2014-09-06 DIAGNOSIS — I2510 Atherosclerotic heart disease of native coronary artery without angina pectoris: Secondary | ICD-10-CM

## 2014-09-06 DIAGNOSIS — I6529 Occlusion and stenosis of unspecified carotid artery: Secondary | ICD-10-CM | POA: Diagnosis not present

## 2014-09-06 DIAGNOSIS — R0602 Shortness of breath: Secondary | ICD-10-CM

## 2014-09-06 DIAGNOSIS — I6523 Occlusion and stenosis of bilateral carotid arteries: Secondary | ICD-10-CM

## 2014-09-06 DIAGNOSIS — I10 Essential (primary) hypertension: Secondary | ICD-10-CM | POA: Diagnosis not present

## 2014-09-06 LAB — LIPID PANEL
Cholesterol: 136 mg/dL (ref 0–200)
HDL: 37 mg/dL — AB (ref >39.00)
LDL CALC: 73 mg/dL (ref 0–99)
NonHDL: 99
TRIGLYCERIDES: 131 mg/dL (ref 0.0–149.0)
Total CHOL/HDL Ratio: 4
VLDL: 26.2 mg/dL (ref 0.0–40.0)

## 2014-09-06 LAB — BASIC METABOLIC PANEL
BUN: 18 mg/dL (ref 6–23)
CO2: 29 mEq/L (ref 19–32)
CREATININE: 1 mg/dL (ref 0.4–1.5)
Calcium: 9.5 mg/dL (ref 8.4–10.5)
Chloride: 105 mEq/L (ref 96–112)
GFR: 74.51 mL/min (ref >60.00)
GLUCOSE: 90 mg/dL (ref 70–99)
POTASSIUM: 3.9 meq/L (ref 3.5–5.1)
Sodium: 142 mEq/L (ref 135–145)

## 2014-09-06 MED ORDER — LOSARTAN POTASSIUM 50 MG PO TABS: 50.0000 mg | ORAL_TABLET | Freq: Every day | ORAL | Status: DC

## 2014-09-06 NOTE — Patient Instructions (Signed)
Your physician recommends that you have  lab work today--BMET/Lipid profile  Your physician has requested that you have a carotid duplex. This test is an ultrasound of the carotid arteries in your neck. It looks at blood flow through these arteries that supply the brain with blood. Allow one hour for this exam. There are no restrictions or special instructions. MARCH 2016  Your physician has requested that you have an abdominal aorta duplex. During this test, an ultrasound is used to evaluate the aorta. Allow 30 minutes for this exam. Do not eat after midnight the day before and avoid carbonated beverages MARCH 2016  Your physician wants you to follow-up in: 6 months with Dr Aundra Dubin. (June 2016).  You will receive a reminder letter in the mail two months in advance. If you don't receive a letter, please call our office to schedule the follow-up appointment.

## 2014-09-07 DIAGNOSIS — Z6827 Body mass index (BMI) 27.0-27.9, adult: Secondary | ICD-10-CM | POA: Diagnosis not present

## 2014-09-07 DIAGNOSIS — M79601 Pain in right arm: Secondary | ICD-10-CM | POA: Diagnosis not present

## 2014-09-07 NOTE — Progress Notes (Signed)
Patient ID: Eric Gordon, male   DOB: 04/18/1931, 80 y.o.   MRN: [id number] PCP: Dr. Virgina Jock  80 yo with history of CAD s/p CABG presents for cardiology followup.  Prior to CABG, patient had significant exertional dyspnea.  He has had trouble long-term with exertional dyspnea.  PFTs in 3/14 showed only mild obstructive airways disease and V/Q scan showed no PE.  Echo in 9/14 showed normal LV systolic function with moderately dilated RV. [place] in 2/15 showed normal right and left heart filling pressures and normal PA pressure.  Finally, he had a CPX in 3/15 that showed normal capacity compared to age-matched sedentary norms.  He was noted to have chronotropic incompetence.  At a prior appointment, I took him off metoprolol given chronotropic incompetence noted on CPX.    Since last appointment, Eric Gordon has lost 26 lbs.  His breathing is much better.  He is no longer using inhalers. He works out at [employer], riding the exercise bike for 5 miles and using [employer].  No chest pain.  His walking is limited by chronic low back pain.   Labs (2/15): K 4.2, creatinine 1.2, LDL 87, HDL 39, BNP 71 Labs (4/15): K 3.9, creatinine 1.1  ECG: NSR, RBBB, LAFB  PMH: 1. Essential tremor 2. CAD: s/p CABG in 3/12.   3. Atrial fibrillation: Only noted post-op CABG.  4. PE: Post-op CABG in 2012.  5. AAA: 3.6 cm on [place] in 3/14.  3.6 cm on [place] in 3/15.  6. OSA: On CPAP.  7. Carotid stenosis: Carotid dopplers (9/24) with 26-83% LICA stenosis.  Carotid dopplers (4/19) with 62-22% LICA stenosis.  8. Asthma 9. PNA x 2 10. Dyspnea: Echo (9/14) with EF 60-65%, mild LVH, very mild AS with mean gradient 10 mmHg, RV moderately dilated.  PFTs (3/14) showed mild obstructive airways disease.  V/Q scan (3/14) with no PE.  ENT workup for upper respiratory causes was negative.  RHC (2/15) with mean RA 7, PA 32/12, mean PCWP 13, CI 3.18.  CPX (3/15) with peak VO2 16.4, VE/VCO2 33; normal when compared to age-matched  sedentary normals; chronotropic incompetence was noted.  Dyspnea was improved with weight loss.  11. HTN 12. Aortic stenosis: Mild.   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   FH: CAD  ROS: All systems reviewed and negative except as per HPI.   Current Outpatient Prescriptions  Medication Sig Dispense Refill  . albuterol (PROVENTIL HFA;VENTOLIN HFA) 108 (90 BASE) MCG/ACT inhaler Inhale 2 puffs into the lungs every 6 (six) hours as needed for wheezing or shortness of breath.    . Ascorbic Acid (VITAMIN C) 500 MG tablet Take 500 mg by mouth daily.      Marland Kitchen aspirin 81 MG tablet Take 81 mg by mouth daily.      Marland Kitchen atorvastatin (LIPITOR) 40 MG tablet Take 40 mg by mouth daily.    . cetirizine (ZYRTEC) 10 MG tablet Take 10 mg by mouth daily.      . cholecalciferol (VITAMIN D) 1000 UNITS tablet Take 1,000 Units by mouth daily.      . furosemide (LASIX) 20 MG tablet Take 10 mg by mouth daily.    Marland Kitchen levothyroxine (SYNTHROID, LEVOTHROID) 50 MCG tablet Take 50 mcg by mouth daily.    . Multiple Vitamins-Minerals (CENTRUM SILVER PO) Take 1 tablet by mouth daily.     . pantoprazole (PROTONIX) 40 MG tablet Take 40 mg by mouth daily.     Marland Kitchen  Tamsulosin HCl (FLOMAX) 0.4 MG CAPS Take 1 capsule (0.4 mg total) by mouth daily. 90 capsule 3  . losartan (COZAAR) 50 MG tablet Take 1 tablet (50 mg total) by mouth daily. 90 tablet 3   No current facility-administered medications for this visit.    BP 142/90 mmHg  Pulse 65  Ht 6' 3.5" (1.918 m)  Wt 216 lb (97.977 kg)  BMI 26.63 kg/m2 General: NAD Neck: No JVD, no thyromegaly or thyroid nodule.  Lungs: Clear to auscultation bilaterally with normal respiratory effort. CV: Nondisplaced PMI.  Heart regular S1/S2, no S3/S4, 1/6 SEM.  No peripheral edema.  No carotid bruit.  Normal pedal pulses.  Abdomen: Soft, nontender, no hepatosplenomegaly, no distention.  Skin: Intact without lesions or rashes.  Neurologic: Alert and oriented x 3.  Psych:  Normal affect. Extremities: No clubbing or cyanosis.   Assessment/Plan: 1. CAD: s/p CABG 3/12.  No chest pain.  Continue ASA 81 and statin.   2. Hyperlipidemia: Check lipids today.    3. Exertional dyspnea:  He had exertional dyspnea prior to CABG and continued to have it afterwards.  I suspect it is not ischemia-related as it did not change after CABG.  He has a smoking history but PFTs showed only mild obstructive airways disease.  Echo showed normal EF but the RV was moderately dilated.  V/Q scan in 3/14 was negative.  RHC showed normal right and left heart filling pressures and no pulmonary hypertension.  ENT workup showed no upper respiratory causes for dyspnea.  He has been on PPI to see if GERD plays a role but it did not help.  CPX showed normal capacity compared to age-matched sedentary norms.  However, there was evidence for chronotropic incompetence.  Stopping metoprolol did not have much effect though he may have a bit more energy.  Today, he is doing much better in terms of his breathing.  He has lost 26 lbs with dieting and is working to lose more.  The weight loss has helped his breathing considerably.   4. HTN: BP runs in the 120s/80s at home, he checks it relatively frequently.   5. Carotid stenosis: Patient is due for carotid dopplers in 3/16.  6. AAA: Repeat abdominal US in 3/16.   [name] 09/07/2014

## 2014-09-08 DIAGNOSIS — H01001 Unspecified blepharitis right upper eyelid: Secondary | ICD-10-CM | POA: Diagnosis not present

## 2014-09-08 DIAGNOSIS — H524 Presbyopia: Secondary | ICD-10-CM | POA: Diagnosis not present

## 2014-09-08 DIAGNOSIS — H2513 Age-related nuclear cataract, bilateral: Secondary | ICD-10-CM | POA: Diagnosis not present

## 2014-09-08 DIAGNOSIS — H02831 Dermatochalasis of right upper eyelid: Secondary | ICD-10-CM | POA: Diagnosis not present

## 2014-10-18 DIAGNOSIS — N401 Enlarged prostate with lower urinary tract symptoms: Secondary | ICD-10-CM | POA: Diagnosis not present

## 2014-10-18 DIAGNOSIS — R972 Elevated prostate specific antigen [PSA]: Secondary | ICD-10-CM | POA: Diagnosis not present

## 2014-10-18 DIAGNOSIS — R3916 Straining to void: Secondary | ICD-10-CM | POA: Diagnosis not present

## 2014-12-06 ENCOUNTER — Ambulatory Visit (HOSPITAL_COMMUNITY): Payer: Medicare Other | Attending: Cardiovascular Disease | Admitting: Cardiology

## 2014-12-06 ENCOUNTER — Ambulatory Visit (HOSPITAL_BASED_OUTPATIENT_CLINIC_OR_DEPARTMENT_OTHER): Payer: Medicare Other | Admitting: Cardiology

## 2014-12-06 DIAGNOSIS — I714 Abdominal aortic aneurysm, without rupture, unspecified: Secondary | ICD-10-CM

## 2014-12-06 DIAGNOSIS — I6523 Occlusion and stenosis of bilateral carotid arteries: Secondary | ICD-10-CM

## 2014-12-06 NOTE — Progress Notes (Signed)
Abdominal Aorta Duplex performed  

## 2014-12-06 NOTE — Progress Notes (Signed)
Carotid duplex performed 

## 2014-12-28 DIAGNOSIS — Z125 Encounter for screening for malignant neoplasm of prostate: Secondary | ICD-10-CM | POA: Diagnosis not present

## 2014-12-28 DIAGNOSIS — E119 Type 2 diabetes mellitus without complications: Secondary | ICD-10-CM | POA: Diagnosis not present

## 2014-12-28 DIAGNOSIS — I2510 Atherosclerotic heart disease of native coronary artery without angina pectoris: Secondary | ICD-10-CM | POA: Diagnosis not present

## 2014-12-28 DIAGNOSIS — E039 Hypothyroidism, unspecified: Secondary | ICD-10-CM | POA: Diagnosis not present

## 2014-12-28 DIAGNOSIS — E785 Hyperlipidemia, unspecified: Secondary | ICD-10-CM | POA: Diagnosis not present

## 2014-12-28 DIAGNOSIS — I10 Essential (primary) hypertension: Secondary | ICD-10-CM | POA: Diagnosis not present

## 2015-01-05 DIAGNOSIS — Z0000 Encounter for general adult medical examination without abnormal findings: Secondary | ICD-10-CM | POA: Diagnosis not present

## 2015-01-05 DIAGNOSIS — J45909 Unspecified asthma, uncomplicated: Secondary | ICD-10-CM | POA: Diagnosis not present

## 2015-01-05 DIAGNOSIS — L84 Corns and callosities: Secondary | ICD-10-CM | POA: Diagnosis not present

## 2015-01-05 DIAGNOSIS — E669 Obesity, unspecified: Secondary | ICD-10-CM | POA: Diagnosis not present

## 2015-01-05 DIAGNOSIS — I6529 Occlusion and stenosis of unspecified carotid artery: Secondary | ICD-10-CM | POA: Diagnosis not present

## 2015-01-05 DIAGNOSIS — Z6828 Body mass index (BMI) 28.0-28.9, adult: Secondary | ICD-10-CM | POA: Diagnosis not present

## 2015-01-05 DIAGNOSIS — R972 Elevated prostate specific antigen [PSA]: Secondary | ICD-10-CM | POA: Diagnosis not present

## 2015-01-05 DIAGNOSIS — I5189 Other ill-defined heart diseases: Secondary | ICD-10-CM | POA: Diagnosis not present

## 2015-01-05 DIAGNOSIS — Z23 Encounter for immunization: Secondary | ICD-10-CM | POA: Diagnosis not present

## 2015-01-05 DIAGNOSIS — Z1389 Encounter for screening for other disorder: Secondary | ICD-10-CM | POA: Diagnosis not present

## 2015-01-05 DIAGNOSIS — I714 Abdominal aortic aneurysm, without rupture: Secondary | ICD-10-CM | POA: Diagnosis not present

## 2015-01-05 DIAGNOSIS — R809 Proteinuria, unspecified: Secondary | ICD-10-CM | POA: Diagnosis not present

## 2015-01-08 DIAGNOSIS — Z1212 Encounter for screening for malignant neoplasm of rectum: Secondary | ICD-10-CM | POA: Diagnosis not present

## 2015-01-22 ENCOUNTER — Encounter: Payer: Self-pay | Admitting: Podiatry

## 2015-01-22 ENCOUNTER — Ambulatory Visit (INDEPENDENT_AMBULATORY_CARE_PROVIDER_SITE_OTHER): Payer: Medicare Other | Admitting: Podiatry

## 2015-01-22 VITALS — BP 154/82 | HR 60 | Resp 12

## 2015-01-22 DIAGNOSIS — M216X9 Other acquired deformities of unspecified foot: Secondary | ICD-10-CM | POA: Diagnosis not present

## 2015-01-22 DIAGNOSIS — I6523 Occlusion and stenosis of bilateral carotid arteries: Secondary | ICD-10-CM | POA: Diagnosis not present

## 2015-01-22 DIAGNOSIS — L84 Corns and callosities: Secondary | ICD-10-CM

## 2015-01-22 NOTE — Patient Instructions (Signed)
Surgical treatment for plantar calluses includes metatarsal osteotomy singles or multiples. Often times there is a high probability that there is a transfer callus. Also, the fat pad in the balls of your feet is thin further causing discomfort in the plantar callus on the left foot.  I'm recommending that you have additional padding attached to the soft insole to further pocket and offload the second left MPJ area

## 2015-01-22 NOTE — Progress Notes (Signed)
   Subjective:    Patient ID: Eric Gordon, male    DOB: 04/08/1931, 80 y.o.   MRN: [id number]  HPI  N-THICK SKIN, SORE L-B/L BALL OF THE FEET D-10 YEARS O-SLOWLY C-WORSE A-PRESSURE T-DR. [place] [employer] TRIM THEM 1 WEEK AGO  Patient describes approximately 10 year history of a painful plantar callus subsecond MPJ left which is debrided at approximately every 3 months at the [place]. Is also wearing multilaminated Plastizote insoles in his shoes. The pain in the plantar second MPJ has increased slightly over time and he wanted know if there was a surgical option for treatment of a painful plantar callus on the plantar aspect the second left MPJ  Review of Systems  HENT: Positive for ear pain and hearing loss.   Musculoskeletal: Positive for back pain and gait problem.       Objective:   Physical Exam  Orientated 3  Vascular: DP right 2/4 and DP left 1/4 PT pulses 2/4 bilaterally Capillary reflex immediate bilaterally Mild edema ankles bilaterally  Neurological: Sensation to 10 g monofilament wire intact 5/5 bilaterally Ankle reflex equal and reactive bilaterally Vibratory sensation nonreactive bilaterally  Dermatological: Bleeding callus subsecond MPJ left Mild plantar callus first and fifth MPJ bilaterally Atrophic fad pad MPJ bilaterally  Musculoskeletal: Patient has stable gait Mild hammertoe second left without contractures second MPJ left Is no restriction ankle, subtalar, midtarsal joints bilaterally      Assessment & Plan:   Assessment: Atrophic fad pad MPJ bilaterally Painful plantar callus subsecond MPJ left  Plan: At this time I advised patient that surgical treatment to redistribute weight off the lesser metatarsal area at high failure rate with reoccurrence and/or transfer. Also, if surgical treatment was done most likely would have to do multiple metatarsals. I recommended conservative care including repetitive debridement and soft insoles. I attached  additional felt to offload the plantar second MPJ left on his custom insert  Reappoint at patient's request

## 2015-03-14 DIAGNOSIS — L821 Other seborrheic keratosis: Secondary | ICD-10-CM | POA: Diagnosis not present

## 2015-05-11 ENCOUNTER — Ambulatory Visit (INDEPENDENT_AMBULATORY_CARE_PROVIDER_SITE_OTHER): Payer: Medicare Other | Admitting: Cardiology

## 2015-05-11 ENCOUNTER — Encounter: Payer: Self-pay | Admitting: Cardiology

## 2015-05-11 ENCOUNTER — Encounter: Payer: Self-pay | Admitting: *Deleted

## 2015-05-11 VITALS — BP 142/86 | HR 74 | Ht 75.5 in | Wt 224.0 lb

## 2015-05-11 DIAGNOSIS — I350 Nonrheumatic aortic (valve) stenosis: Secondary | ICD-10-CM | POA: Diagnosis not present

## 2015-05-11 DIAGNOSIS — I6523 Occlusion and stenosis of bilateral carotid arteries: Secondary | ICD-10-CM | POA: Diagnosis not present

## 2015-05-11 DIAGNOSIS — I714 Abdominal aortic aneurysm, without rupture, unspecified: Secondary | ICD-10-CM

## 2015-05-11 DIAGNOSIS — I6529 Occlusion and stenosis of unspecified carotid artery: Secondary | ICD-10-CM

## 2015-05-11 DIAGNOSIS — I10 Essential (primary) hypertension: Secondary | ICD-10-CM | POA: Diagnosis not present

## 2015-05-11 LAB — BASIC METABOLIC PANEL
BUN: 21 mg/dL (ref 6–23)
CALCIUM: 9.2 mg/dL (ref 8.4–10.5)
CO2: 31 meq/L (ref 19–32)
Chloride: 105 mEq/L (ref 96–112)
Creatinine, Ser: 1.04 mg/dL (ref 0.40–1.50)
GFR: 72.73 mL/min (ref >60.00)
Glucose, Bld: 98 mg/dL (ref 70–99)
Potassium: 3.9 mEq/L (ref 3.5–5.1)
Sodium: 143 mEq/L (ref 135–145)

## 2015-05-11 NOTE — Patient Instructions (Signed)
Medication Instructions:  No changes today  Labwork: BMET today.  Your physician recommends that you return for a FASTING lipid profile: in December 2016  Testing/Procedures: Your physician has requested that you have a carotid duplex. This test is an ultrasound of the carotid arteries in your neck. It looks at blood flow through these arteries that supply the brain with blood. Allow one hour for this exam. There are no restrictions or special instructions. September 2016  Your physician has requested that you have an abdominal aorta duplex. During this test, an ultrasound is used to evaluate the aorta. Allow 30 minutes for this exam. Do not eat after midnight the day before and avoid carbonated beverages MARCH 2017     Follow-Up: Your physician wants you to follow-up in: April 2017 with Dr Aundra Dubin.  You will receive a reminder letter in the mail two months in advance. If you don't receive a letter, please call our office to schedule the follow-up appointment.

## 2015-05-13 NOTE — Progress Notes (Signed)
Patient ID: Eric Gordon, male   DOB: [birth year], 81 y.o.   MRN: [id number] PCP: Dr. Virgina Jock  81 yo with history of CAD s/p CABG presents for cardiology followup.  Prior to CABG, patient had significant exertional dyspnea.  He has had trouble long-term with exertional dyspnea.  PFTs in 3/14 showed only mild obstructive airways disease and V/Q scan showed no PE.  Echo in 9/14 showed normal LV systolic function with moderately dilated RV. [place] in 2/15 showed normal right and left heart filling pressures and normal PA pressure.  Finally, he had a CPX in 3/15 that showed normal capacity compared to age-matched sedentary norms.  He was noted to have chronotropic incompetence.  At a prior appointment, I took him off metoprolol given chronotropic incompetence noted on CPX.  Dyspnea improved significantly with weight loss.   Eric Gordon is doing well today.  No significant dyspnea.  Stays active, exercises several times a week.  No particular limitation.  No chest pain.  BP at home < 140/90.    Labs (2/15): K 4.2, creatinine 1.2, LDL 87, HDL 39, BNP 71 Labs (4/15): K 3.9, creatinine 1.1 Labs (12/15): LDL 73, HDL 37, K 3.9, creatinine 1.0  ECG: NSR, RBBB, LAFB  PMH: 1. Essential tremor 2. CAD: s/p CABG in 3/12.   3. Atrial fibrillation: Only noted post-op CABG.  4. PE: Post-op CABG in 2012.  5. AAA: 3.6 cm on [place] in 3/14.  3.6 cm on [place] in 3/15. 3.6 cm on [place] 8/16.  6. OSA: On CPAP.  7. Carotid stenosis: Carotid dopplers (0/34) with 74-25% LICA stenosis.  Carotid dopplers (9/56) with 38-75% LICA stenosis. Carotid dopplers (3/16) with 60-79% RCIA stenosis, 64-33% LICA stenosis.  8. Asthma 9. PNA x 2 10. Dyspnea: Echo (9/14) with EF 60-65%, mild LVH, very mild AS with mean gradient 10 mmHg, RV moderately dilated.  PFTs (3/14) showed mild obstructive airways disease.  V/Q scan (3/14) with no PE.  ENT workup for upper respiratory causes was negative.  RHC (2/15) with mean RA 7, PA 32/12, mean PCWP 13, CI  3.18.  CPX (3/15) with peak VO2 16.4, VE/VCO2 33; normal when compared to age-matched sedentary normals; chronotropic incompetence was noted.  Dyspnea was improved with weight loss.  11. HTN 12. Aortic stenosis: Mild.   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   FH: CAD  ROS: All systems reviewed and negative except as per HPI.   Current Outpatient Prescriptions  Medication Sig Dispense Refill  . Ascorbic Acid (VITAMIN C) 500 MG tablet Take 500 mg by mouth daily.      Marland Kitchen aspirin 81 MG tablet Take 81 mg by mouth daily.      Marland Kitchen atorvastatin (LIPITOR) 40 MG tablet Take 40 mg by mouth daily.    . cetirizine (ZYRTEC) 10 MG tablet Take 10 mg by mouth daily.      . cholecalciferol (VITAMIN D) 1000 UNITS tablet Take 1,000 Units by mouth daily.      . furosemide (LASIX) 20 MG tablet Take 10 mg by mouth daily.    Marland Kitchen levothyroxine (SYNTHROID, LEVOTHROID) 50 MCG tablet Take 50 mcg by mouth daily.    Marland Kitchen losartan (COZAAR) 50 MG tablet Take 1 tablet (50 mg total) by mouth daily. 90 tablet 3  . Multiple Vitamins-Minerals (CENTRUM SILVER PO) Take 1 tablet by mouth daily.     . pantoprazole (PROTONIX) 40 MG tablet Take 40 mg by mouth daily.     Marland Kitchen  Tamsulosin HCl (FLOMAX) 0.4 MG CAPS Take 1 capsule (0.4 mg total) by mouth daily. 90 capsule 3   No current facility-administered medications for this visit.    BP 142/86 mmHg  Pulse 74  Ht 6' 3.5" (1.918 m)  Wt 224 lb (101.606 kg)  BMI 27.62 kg/m2 General: NAD Neck: No JVD, no thyromegaly or thyroid nodule.  Lungs: Clear to auscultation bilaterally with normal respiratory effort. CV: Nondisplaced PMI.  Heart regular S1/S2, no S3/S4, 1/6 SEM.  No peripheral edema.  Bilateral carotid bruits.  Normal pedal pulses.  Abdomen: Soft, nontender, no hepatosplenomegaly, no distention.  Skin: Intact without lesions or rashes.  Neurologic: Alert and oriented x 3.  Psych: Normal affect. Extremities: No clubbing or cyanosis.    Assessment/Plan: 1. CAD: s/p CABG 3/12.  No chest pain.  Continue ASA 81 and statin.   2. Hyperlipidemia: Check lipids in 12/16.    3. Exertional dyspnea:  This appeared to mostly resolve with exercise and weight loss.   4. HTN: Good BP at home, he checks it relatively frequently.   5. Carotid stenosis: Patient is due for carotid dopplers in 9/16.  6. AAA: Repeat abdominal US in 3/17.  7. Aortic stenosis: Mild on last echo.  Unless new symptoms develop, would repeat echo in 1-2 years.   [name] 05/13/2015

## 2015-05-17 ENCOUNTER — Telehealth: Payer: Self-pay

## 2015-05-17 NOTE — Telephone Encounter (Signed)
pt called needing his losartan refilled and I called [facility] and they have the refill avaliable for pt. I called and let pt know refill will be avaliable soon.   Medication Detail      Disp Refills Start End     losartan (COZAAR) 50 MG tablet 90 tablet 3 09/06/2014     Sig - Route: Take 1 tablet (50 mg total) by mouth daily. - Oral    E-Prescribing Status: Receipt confirmed by pharmacy (09/06/2014 3:37 PM EST

## 2015-05-21 ENCOUNTER — Ambulatory Visit (HOSPITAL_COMMUNITY)
Admission: RE | Admit: 2015-05-21 | Discharge: 2015-05-21 | Disposition: A | Discharge status: Home / Self Care | Payer: Medicare Other | Source: Ambulatory Visit | Attending: Cardiovascular Disease | Admitting: Cardiovascular Disease

## 2015-05-21 DIAGNOSIS — I714 Abdominal aortic aneurysm, without rupture, unspecified: Secondary | ICD-10-CM

## 2015-05-21 DIAGNOSIS — I6523 Occlusion and stenosis of bilateral carotid arteries: Secondary | ICD-10-CM | POA: Diagnosis not present

## 2015-05-21 DIAGNOSIS — I10 Essential (primary) hypertension: Secondary | ICD-10-CM | POA: Diagnosis not present

## 2015-06-26 DIAGNOSIS — Z23 Encounter for immunization: Secondary | ICD-10-CM | POA: Diagnosis not present

## 2015-06-27 DIAGNOSIS — M5136 Other intervertebral disc degeneration, lumbar region: Secondary | ICD-10-CM | POA: Diagnosis not present

## 2015-07-13 DIAGNOSIS — I10 Essential (primary) hypertension: Secondary | ICD-10-CM | POA: Diagnosis not present

## 2015-07-13 DIAGNOSIS — I2510 Atherosclerotic heart disease of native coronary artery without angina pectoris: Secondary | ICD-10-CM | POA: Diagnosis not present

## 2015-07-13 DIAGNOSIS — E785 Hyperlipidemia, unspecified: Secondary | ICD-10-CM | POA: Diagnosis not present

## 2015-07-13 DIAGNOSIS — Z6828 Body mass index (BMI) 28.0-28.9, adult: Secondary | ICD-10-CM | POA: Diagnosis not present

## 2015-07-13 DIAGNOSIS — R069 Unspecified abnormalities of breathing: Secondary | ICD-10-CM | POA: Diagnosis not present

## 2015-07-13 DIAGNOSIS — E039 Hypothyroidism, unspecified: Secondary | ICD-10-CM | POA: Diagnosis not present

## 2015-07-13 DIAGNOSIS — E119 Type 2 diabetes mellitus without complications: Secondary | ICD-10-CM | POA: Diagnosis not present

## 2015-07-13 DIAGNOSIS — E669 Obesity, unspecified: Secondary | ICD-10-CM | POA: Diagnosis not present

## 2015-07-13 DIAGNOSIS — I6529 Occlusion and stenosis of unspecified carotid artery: Secondary | ICD-10-CM | POA: Diagnosis not present

## 2015-07-13 DIAGNOSIS — I714 Abdominal aortic aneurysm, without rupture: Secondary | ICD-10-CM | POA: Diagnosis not present

## 2015-07-13 DIAGNOSIS — N401 Enlarged prostate with lower urinary tract symptoms: Secondary | ICD-10-CM | POA: Diagnosis not present

## 2015-07-13 DIAGNOSIS — I350 Nonrheumatic aortic (valve) stenosis: Secondary | ICD-10-CM | POA: Diagnosis not present

## 2015-08-01 DIAGNOSIS — D1801 Hemangioma of skin and subcutaneous tissue: Secondary | ICD-10-CM | POA: Diagnosis not present

## 2015-08-01 DIAGNOSIS — D225 Melanocytic nevi of trunk: Secondary | ICD-10-CM | POA: Diagnosis not present

## 2015-08-01 DIAGNOSIS — L814 Other melanin hyperpigmentation: Secondary | ICD-10-CM | POA: Diagnosis not present

## 2015-08-01 DIAGNOSIS — L570 Actinic keratosis: Secondary | ICD-10-CM | POA: Diagnosis not present

## 2015-08-01 DIAGNOSIS — D692 Other nonthrombocytopenic purpura: Secondary | ICD-10-CM | POA: Diagnosis not present

## 2015-08-01 DIAGNOSIS — L821 Other seborrheic keratosis: Secondary | ICD-10-CM | POA: Diagnosis not present

## 2015-08-01 DIAGNOSIS — L820 Inflamed seborrheic keratosis: Secondary | ICD-10-CM | POA: Diagnosis not present

## 2015-08-01 DIAGNOSIS — L918 Other hypertrophic disorders of the skin: Secondary | ICD-10-CM | POA: Diagnosis not present

## 2015-09-04 DIAGNOSIS — M5136 Other intervertebral disc degeneration, lumbar region: Secondary | ICD-10-CM | POA: Diagnosis not present

## 2015-09-10 ENCOUNTER — Other Ambulatory Visit (INDEPENDENT_AMBULATORY_CARE_PROVIDER_SITE_OTHER): Payer: Medicare Other | Admitting: *Deleted

## 2015-09-10 DIAGNOSIS — I10 Essential (primary) hypertension: Secondary | ICD-10-CM

## 2015-09-10 DIAGNOSIS — H25813 Combined forms of age-related cataract, bilateral: Secondary | ICD-10-CM | POA: Diagnosis not present

## 2015-09-10 DIAGNOSIS — H02831 Dermatochalasis of right upper eyelid: Secondary | ICD-10-CM | POA: Diagnosis not present

## 2015-09-10 DIAGNOSIS — Z0100 Encounter for examination of eyes and vision without abnormal findings: Secondary | ICD-10-CM | POA: Diagnosis not present

## 2015-09-10 DIAGNOSIS — H01001 Unspecified blepharitis right upper eyelid: Secondary | ICD-10-CM | POA: Diagnosis not present

## 2015-09-10 LAB — LIPID PANEL
CHOLESTEROL: 130 mg/dL (ref 125–200)
HDL: 49 mg/dL (ref >=40)
LDL Cholesterol: 71 mg/dL (ref <130)
TRIGLYCERIDES: 51 mg/dL (ref <150)
Total CHOL/HDL Ratio: 2.7 Ratio (ref <=5.0)
VLDL: 10 mg/dL (ref <30)

## 2015-09-17 ENCOUNTER — Other Ambulatory Visit: Payer: Self-pay | Admitting: Cardiology

## 2015-09-18 ENCOUNTER — Other Ambulatory Visit: Payer: Self-pay | Admitting: *Deleted

## 2015-09-18 DIAGNOSIS — I6523 Occlusion and stenosis of bilateral carotid arteries: Secondary | ICD-10-CM

## 2015-09-18 DIAGNOSIS — I714 Abdominal aortic aneurysm, without rupture, unspecified: Secondary | ICD-10-CM

## 2015-09-18 MED ORDER — LOSARTAN POTASSIUM 50 MG PO TABS: 50.0000 mg | ORAL_TABLET | Freq: Every day | ORAL | Status: DC

## 2015-12-18 ENCOUNTER — Inpatient Hospital Stay (HOSPITAL_COMMUNITY): Admission: RE | Admit: 2015-12-18 | Payer: Medicare Other | Source: Ambulatory Visit

## 2015-12-21 ENCOUNTER — Emergency Department (HOSPITAL_COMMUNITY)
Admission: EM | Admit: 2015-12-21 | Discharge: 2015-12-21 | Disposition: A | Discharge status: Home / Self Care | Payer: Medicare Other | Attending: Emergency Medicine | Admitting: Emergency Medicine

## 2015-12-21 ENCOUNTER — Encounter (HOSPITAL_COMMUNITY): Payer: Self-pay | Admitting: *Deleted

## 2015-12-21 ENCOUNTER — Emergency Department (HOSPITAL_COMMUNITY): Payer: Medicare Other

## 2015-12-21 DIAGNOSIS — R0602 Shortness of breath: Secondary | ICD-10-CM | POA: Diagnosis present

## 2015-12-21 DIAGNOSIS — Z951 Presence of aortocoronary bypass graft: Secondary | ICD-10-CM | POA: Insufficient documentation

## 2015-12-21 DIAGNOSIS — Z87891 Personal history of nicotine dependence: Secondary | ICD-10-CM | POA: Insufficient documentation

## 2015-12-21 DIAGNOSIS — R05 Cough: Secondary | ICD-10-CM | POA: Diagnosis not present

## 2015-12-21 DIAGNOSIS — J069 Acute upper respiratory infection, unspecified: Secondary | ICD-10-CM | POA: Insufficient documentation

## 2015-12-21 DIAGNOSIS — K219 Gastro-esophageal reflux disease without esophagitis: Secondary | ICD-10-CM | POA: Insufficient documentation

## 2015-12-21 DIAGNOSIS — I10 Essential (primary) hypertension: Secondary | ICD-10-CM | POA: Insufficient documentation

## 2015-12-21 DIAGNOSIS — Z7982 Long term (current) use of aspirin: Secondary | ICD-10-CM | POA: Insufficient documentation

## 2015-12-21 DIAGNOSIS — Z9889 Other specified postprocedural states: Secondary | ICD-10-CM | POA: Diagnosis not present

## 2015-12-21 DIAGNOSIS — Z7901 Long term (current) use of anticoagulants: Secondary | ICD-10-CM | POA: Insufficient documentation

## 2015-12-21 DIAGNOSIS — Z79899 Other long term (current) drug therapy: Secondary | ICD-10-CM | POA: Diagnosis not present

## 2015-12-21 DIAGNOSIS — J45901 Unspecified asthma with (acute) exacerbation: Secondary | ICD-10-CM | POA: Insufficient documentation

## 2015-12-21 DIAGNOSIS — I2510 Atherosclerotic heart disease of native coronary artery without angina pectoris: Secondary | ICD-10-CM | POA: Insufficient documentation

## 2015-12-21 DIAGNOSIS — Z86711 Personal history of pulmonary embolism: Secondary | ICD-10-CM | POA: Insufficient documentation

## 2015-12-21 DIAGNOSIS — M1990 Unspecified osteoarthritis, unspecified site: Secondary | ICD-10-CM | POA: Diagnosis not present

## 2015-12-21 DIAGNOSIS — F17210 Nicotine dependence, cigarettes, uncomplicated: Secondary | ICD-10-CM | POA: Diagnosis not present

## 2015-12-21 HISTORY — DX: Abdominal aortic aneurysm, without rupture, unspecified: I71.40

## 2015-12-21 HISTORY — DX: Abdominal aortic aneurysm, without rupture: I71.4

## 2015-12-21 LAB — COMPREHENSIVE METABOLIC PANEL
ALBUMIN: 3.4 g/dL — AB (ref 3.5–5.0)
ALK PHOS: 65 U/L (ref 38–126)
ALT: 18 U/L (ref 17–63)
ANION GAP: 12 (ref 5–15)
AST: 27 U/L (ref 15–41)
BILIRUBIN TOTAL: 1.2 mg/dL (ref 0.3–1.2)
BUN: 19 mg/dL (ref 6–20)
CALCIUM: 9.2 mg/dL (ref 8.9–10.3)
CO2: 25 mmol/L (ref 22–32)
Chloride: 102 mmol/L (ref 101–111)
Creatinine, Ser: 1.28 mg/dL — ABNORMAL HIGH (ref 0.61–1.24)
GFR, EST AFRICAN AMERICAN: 58 mL/min — AB (ref >60)
GFR, EST NON AFRICAN AMERICAN: 50 mL/min — AB (ref >60)
GLUCOSE: 92 mg/dL (ref 65–99)
POTASSIUM: 4.2 mmol/L (ref 3.5–5.1)
SODIUM: 139 mmol/L (ref 135–145)
TOTAL PROTEIN: 7.2 g/dL (ref 6.5–8.1)

## 2015-12-21 LAB — CBC WITH DIFFERENTIAL/PLATELET
BASOS ABS: 0 10*3/uL (ref 0.0–0.1)
BASOS PCT: 0 %
EOS PCT: 1 %
Eosinophils Absolute: 0.2 10*3/uL (ref 0.0–0.7)
HCT: 45 % (ref 39.0–52.0)
Hemoglobin: 15.2 g/dL (ref 13.0–17.0)
Lymphocytes Relative: 15 %
Lymphs Abs: 2.3 10*3/uL (ref 0.7–4.0)
MCH: 31.8 pg (ref 26.0–34.0)
MCHC: 33.8 g/dL (ref 30.0–36.0)
MCV: 94.1 fL (ref 78.0–100.0)
MONO ABS: 1.6 10*3/uL — AB (ref 0.1–1.0)
Monocytes Relative: 10 %
NEUTROS ABS: 11.5 10*3/uL — AB (ref 1.7–7.7)
Neutrophils Relative %: 74 %
PLATELETS: 222 10*3/uL (ref 150–400)
RBC: 4.78 MIL/uL (ref 4.22–5.81)
RDW: 13.7 % (ref 11.5–15.5)
WBC: 15.6 10*3/uL — AB (ref 4.0–10.5)

## 2015-12-21 LAB — URINALYSIS, ROUTINE W REFLEX MICROSCOPIC
Bilirubin Urine: NEGATIVE
GLUCOSE, UA: NEGATIVE mg/dL
HGB URINE DIPSTICK: NEGATIVE
KETONES UR: NEGATIVE mg/dL
Nitrite: NEGATIVE
PROTEIN: NEGATIVE mg/dL
Specific Gravity, Urine: 1.007 (ref 1.005–1.030)
pH: 7.5 (ref 5.0–8.0)

## 2015-12-21 LAB — URINE MICROSCOPIC-ADD ON

## 2015-12-21 LAB — I-STAT CG4 LACTIC ACID, ED: LACTIC ACID, VENOUS: 1.74 mmol/L (ref 0.5–2.0)

## 2015-12-21 LAB — I-STAT TROPONIN, ED: Troponin i, poc: 0.01 ng/mL (ref 0.00–0.08)

## 2015-12-21 MED ORDER — PREDNISONE 50 MG PO TABS: ORAL_TABLET | ORAL | Status: DC

## 2015-12-21 MED ORDER — IPRATROPIUM BROMIDE 0.02 % IN SOLN
0.5000 mg | Freq: Once | RESPIRATORY_TRACT | Status: AC
  Administered 2015-12-21: 0.5 mg via RESPIRATORY_TRACT
  Filled 2015-12-21: qty 2.5

## 2015-12-21 MED ORDER — METHYLPREDNISOLONE SODIUM SUCC 125 MG IJ SOLR
125.0000 mg | Freq: Once | INTRAMUSCULAR | Status: AC
  Administered 2015-12-21: 125 mg via INTRAVENOUS
  Filled 2015-12-21: qty 2

## 2015-12-21 MED ORDER — ALBUTEROL SULFATE HFA 108 (90 BASE) MCG/ACT IN AERS: 1.0000 | INHALATION_SPRAY | Freq: Four times a day (QID) | RESPIRATORY_TRACT | Status: DC | PRN

## 2015-12-21 MED ORDER — DOXYCYCLINE HYCLATE 100 MG PO CAPS: 100.0000 mg | ORAL_CAPSULE | Freq: Two times a day (BID) | ORAL | Status: DC

## 2015-12-21 MED ORDER — SODIUM CHLORIDE 0.9 % IV BOLUS (SEPSIS)
500.0000 mL | Freq: Once | INTRAVENOUS | Status: AC
  Administered 2015-12-21: 500 mL via INTRAVENOUS

## 2015-12-21 MED ORDER — DOXYCYCLINE HYCLATE 100 MG PO TABS
100.0000 mg | ORAL_TABLET | Freq: Once | ORAL | Status: AC
  Administered 2015-12-21: 100 mg via ORAL
  Filled 2015-12-21: qty 1

## 2015-12-21 MED ORDER — HYDROCOD POLST-CPM POLST ER 10-8 MG/5ML PO SUER: 5.0000 mL | Freq: Two times a day (BID) | ORAL | Status: DC | PRN

## 2015-12-21 NOTE — ED Notes (Signed)
Pt ambulatory in hallway O2 sats maintained above 93% on RA. No respiratory distress noted. MD aware.

## 2015-12-21 NOTE — ED Notes (Signed)
Pt's oxygen saturation noted to be 76% on room air with a good pleth, pt placed on 2L nasal cannula with improvement to 94%.

## 2015-12-21 NOTE — ED Notes (Signed)
Pt with cough, body aches and low grade fever for 2 weeks.  Now experiencing chest pain and sob.  Breathing labored, extremities cool.

## 2015-12-21 NOTE — Discharge Instructions (Signed)
Chest x-ray showed no pneumonia. Prescriptions for antibiotic, prednisone, cough syrup, inhaler. Return if worse in anyway.

## 2015-12-21 NOTE — ED Provider Notes (Addendum)
CSN: [id number]     Arrival date & time 12/21/15  1116 History   First MD Initiated Contact with Patient 12/21/15 1136     Chief Complaint  Patient presents with  . Shortness of Breath  . Chest Pain     (Consider location/radiation/quality/duration/timing/severity/associated sxs/prior Treatment) HPI.Marland KitchenMarland KitchenMarland KitchenCough for 2 and half weeks with associated dyspnea and intermittent chest pain. Past medical history includes CABG in 2012 and a known AAA.  Patient thinks he has been exposed to a viral upper respiratory infection. No fever, chills, rusty sputum. He did have pneumonia in 2010. He has been eating and minimally active. Severity of symptoms is moderate.  Past Medical History  Diagnosis Date  . Hypertension   . S/P CABG (coronary artery bypass graft)   . GERD (gastroesophageal reflux disease)   . Pulmonary embolism [facility])     April 2012 after CABG  . Sleep apnea   . Asthma   . CAD (coronary artery disease)   . Chronic anticoagulation     on coumadin for PE  . Arthritis   . Abdominal aortic aneurysm (AAA) [facility])    Past Surgical History  Procedure Laterality Date  . Rotator cuff repair  2003    right  . Penile prosthesis implant    . Knee surgery  2006    left  . Inguinal hernia repair  1980  . Coronary artery bypass graft  March 2012    x 5  . Right heart catheterization N/A 11/14/2013    Procedure: RIGHT HEART CATH;  Surgeon: Larey Dresser, MD;  Location: [facility] CATH LAB;  Service: Cardiovascular;  Laterality: N/A;   Family History  Problem Relation Age of Onset  . Coronary artery disease Mother   . Hypertension Mother   . Heart disease Mother    Social History  Substance Use Topics  . Smoking status: Former Smoker -- 1.00 packs/day for 35 years    Types: Cigarettes    Quit date: 09/23/1983  . Smokeless tobacco: Never Used  . Alcohol Use: 1.5 oz/week    3 Standard drinks or equivalent per week    Review of Systems  All other systems reviewed and are  negative.     Allergies  Ancef and Vancomycin  Home Medications   Prior to Admission medications   Medication Sig Start Date End Date Taking? Authorizing Provider  Ascorbic Acid (VITAMIN C) 500 MG tablet Take 500 mg by mouth daily.     Yes Historical Provider, MD  aspirin 81 MG tablet Take 81 mg by mouth daily.     Yes Historical Provider, MD  atorvastatin (LIPITOR) 40 MG tablet Take 40 mg by mouth daily.   Yes Historical Provider, MD  cetirizine (ZYRTEC) 10 MG tablet Take 10 mg by mouth daily.     Yes Historical Provider, MD  cholecalciferol (VITAMIN D) 1000 UNITS tablet Take 1,000 Units by mouth daily.     Yes Historical Provider, MD  furosemide (LASIX) 20 MG tablet Take 10 mg by mouth daily.   Yes Historical Provider, MD  levothyroxine (SYNTHROID, LEVOTHROID) 50 MCG tablet Take 50 mcg by mouth daily.   Yes Historical Provider, MD  losartan (COZAAR) 50 MG tablet Take 1 tablet (50 mg total) by mouth daily. 09/18/15  Yes Larey Dresser, MD  Multiple Vitamins-Minerals (CENTRUM SILVER PO) Take 1 tablet by mouth daily.    Yes Historical Provider, MD  pantoprazole (PROTONIX) 40 MG tablet Take 40 mg by mouth daily.    Yes Historical  Provider, MD  Tamsulosin HCl (FLOMAX) 0.4 MG CAPS Take 1 capsule (0.4 mg total) by mouth daily. 04/02/11  Yes Renella Cunas, MD  albuterol (PROVENTIL HFA;VENTOLIN HFA) 108 (90 Base) MCG/ACT inhaler Inhale 1-2 puffs into the lungs every 6 (six) hours as needed for wheezing or shortness of breath. 12/21/15   Nat Christen, MD  chlorpheniramine-HYDROcodone [facility]) 10-8 MG/5ML SUER Take 5 mLs by mouth every 12 (twelve) hours as needed for cough. 12/21/15   Nat Christen, MD  doxycycline (VIBRAMYCIN) 100 MG capsule Take 1 capsule (100 mg total) by mouth 2 (two) times daily. 12/21/15   Nat Christen, MD  predniSONE (DELTASONE) 50 MG tablet 1 tablet daily for 7 days 12/21/15   Nat Christen, MD   BP 157/78 mmHg  Pulse 74  Temp(Src) 97.7 F (36.5 C) (Oral)  Resp 33   Ht 6' 3.5" (1.918 m)  Wt 226 lb (102.513 kg)  BMI 27.87 kg/m2  SpO2 94% Physical Exam  Constitutional: He is oriented to person, place, and time.  Good color but slight tachypnea and dyspnea.  HENT:  Head: Normocephalic and atraumatic.  Eyes: Conjunctivae and EOM are normal. Pupils are equal, round, and reactive to light.  Neck: Normal range of motion. Neck supple.  Cardiovascular: Normal rate and regular rhythm.   Pulmonary/Chest: Effort normal and breath sounds normal.  Abdominal: Soft. Bowel sounds are normal.  Musculoskeletal: Normal range of motion.  Neurological: He is alert and oriented to person, place, and time.  Skin: Skin is warm and dry.  Psychiatric: He has a normal mood and affect. His behavior is normal.  Nursing note and vitals reviewed.   ED Course  Procedures (including critical care time) Labs Review Labs Reviewed  COMPREHENSIVE METABOLIC PANEL - Abnormal; Notable for the following:    Creatinine, Ser 1.28 (*)    Albumin 3.4 (*)    GFR calc non Af Amer 50 (*)    GFR calc Af Amer 58 (*)    All other components within normal limits  URINALYSIS, ROUTINE W REFLEX MICROSCOPIC (NOT AT [facility]) - Abnormal; Notable for the following:    Leukocytes, UA TRACE (*)    All other components within normal limits  CBC WITH DIFFERENTIAL/PLATELET - Abnormal; Notable for the following:    WBC 15.6 (*)    Neutro Abs 11.5 (*)    Monocytes Absolute 1.6 (*)    All other components within normal limits  URINE MICROSCOPIC-ADD ON - Abnormal; Notable for the following:    Squamous Epithelial / LPF 0-5 (*)    Bacteria, UA RARE (*)    All other components within normal limits  URINE CULTURE  I-STAT CG4 LACTIC ACID, ED  I-STAT TROPOININ, ED  I-STAT CG4 LACTIC ACID, ED    Imaging Review Dg Chest 2 View  12/21/2015  CLINICAL DATA:  Worsening cough for 2 weeks, chest pain EXAM: CHEST  2 VIEW COMPARISON:  11/23/2012 FINDINGS: Cardiomediastinal silhouette is stable. No acute  infiltrate or pleural effusion. No pulmonary edema. Osteopenia and mild degenerative changes thoracic spine. Mild hyperinflation again noted. Status post CABG. IMPRESSION: No active cardiopulmonary disease. Mild hyperinflation again noted. Status post CABG. Electronically Signed   By: Lahoma Crocker M.D.   On: 12/21/2015 12:42   I have personally reviewed and evaluated these images and lab results as part of my medical decision-making.   Date: 12/21/2015  Rate: 84  Rhythm: normal sinus rhythm PAC  QRS Axis: normal  Intervals: normal  ST/T Wave abnormalities:  normal  Conduction Disutrbances: RBBB  Narrative Interpretation: unremarkable     MDM   Final diagnoses:  URI (upper respiratory infection)    Screening tests including labs, chest x-ray, EKG showed no acute pathology. Patient feels much better after IV steroids and and ipratropium breathing treatment. He was ambulatory without obvious dyspnea. Discharge medications include doxycycline 100 mg, prednisone, Tussionex, albuterol inhaler.  Discussed test results with the patient and his wife. He will return if worse.    Nat Christen, MD 12/21/15 1518  Nat Christen, MD 12/21/15 1520

## 2015-12-22 LAB — URINE CULTURE

## 2015-12-26 DIAGNOSIS — N401 Enlarged prostate with lower urinary tract symptoms: Secondary | ICD-10-CM | POA: Diagnosis not present

## 2015-12-26 DIAGNOSIS — R3916 Straining to void: Secondary | ICD-10-CM | POA: Diagnosis not present

## 2015-12-26 DIAGNOSIS — Z0000 Encounter for general adult medical examination without abnormal findings: Secondary | ICD-10-CM | POA: Diagnosis not present

## 2016-01-07 DIAGNOSIS — E038 Other specified hypothyroidism: Secondary | ICD-10-CM | POA: Diagnosis not present

## 2016-01-07 DIAGNOSIS — Z125 Encounter for screening for malignant neoplasm of prostate: Secondary | ICD-10-CM | POA: Diagnosis not present

## 2016-01-07 DIAGNOSIS — E119 Type 2 diabetes mellitus without complications: Secondary | ICD-10-CM | POA: Diagnosis not present

## 2016-01-07 DIAGNOSIS — E784 Other hyperlipidemia: Secondary | ICD-10-CM | POA: Diagnosis not present

## 2016-01-07 DIAGNOSIS — I10 Essential (primary) hypertension: Secondary | ICD-10-CM | POA: Diagnosis not present

## 2016-01-10 DIAGNOSIS — E038 Other specified hypothyroidism: Secondary | ICD-10-CM | POA: Diagnosis not present

## 2016-01-10 DIAGNOSIS — Z1389 Encounter for screening for other disorder: Secondary | ICD-10-CM | POA: Diagnosis not present

## 2016-01-10 DIAGNOSIS — E119 Type 2 diabetes mellitus without complications: Secondary | ICD-10-CM | POA: Diagnosis not present

## 2016-01-10 DIAGNOSIS — I6529 Occlusion and stenosis of unspecified carotid artery: Secondary | ICD-10-CM | POA: Diagnosis not present

## 2016-01-10 DIAGNOSIS — G252 Other specified forms of tremor: Secondary | ICD-10-CM | POA: Diagnosis not present

## 2016-01-10 DIAGNOSIS — I714 Abdominal aortic aneurysm, without rupture: Secondary | ICD-10-CM | POA: Diagnosis not present

## 2016-01-10 DIAGNOSIS — N401 Enlarged prostate with lower urinary tract symptoms: Secondary | ICD-10-CM | POA: Diagnosis not present

## 2016-01-10 DIAGNOSIS — Z6829 Body mass index (BMI) 29.0-29.9, adult: Secondary | ICD-10-CM | POA: Diagnosis not present

## 2016-01-10 DIAGNOSIS — R0609 Other forms of dyspnea: Secondary | ICD-10-CM | POA: Diagnosis not present

## 2016-01-10 DIAGNOSIS — I119 Hypertensive heart disease without heart failure: Secondary | ICD-10-CM | POA: Diagnosis not present

## 2016-01-10 DIAGNOSIS — E784 Other hyperlipidemia: Secondary | ICD-10-CM | POA: Diagnosis not present

## 2016-01-10 DIAGNOSIS — Z0000 Encounter for general adult medical examination without abnormal findings: Secondary | ICD-10-CM | POA: Diagnosis not present

## 2016-01-15 ENCOUNTER — Ambulatory Visit (HOSPITAL_COMMUNITY)
Admission: RE | Admit: 2016-01-15 | Discharge: 2016-01-15 | Disposition: A | Discharge status: Home / Self Care | Payer: Medicare Other | Source: Ambulatory Visit | Attending: Cardiology | Admitting: Cardiology

## 2016-01-15 DIAGNOSIS — I10 Essential (primary) hypertension: Secondary | ICD-10-CM | POA: Diagnosis not present

## 2016-01-15 DIAGNOSIS — I700 Atherosclerosis of aorta: Secondary | ICD-10-CM | POA: Insufficient documentation

## 2016-01-15 DIAGNOSIS — I708 Atherosclerosis of other arteries: Secondary | ICD-10-CM | POA: Insufficient documentation

## 2016-01-15 DIAGNOSIS — K219 Gastro-esophageal reflux disease without esophagitis: Secondary | ICD-10-CM | POA: Diagnosis not present

## 2016-01-15 DIAGNOSIS — I714 Abdominal aortic aneurysm, without rupture, unspecified: Secondary | ICD-10-CM

## 2016-01-15 DIAGNOSIS — I6523 Occlusion and stenosis of bilateral carotid arteries: Secondary | ICD-10-CM | POA: Insufficient documentation

## 2016-01-15 DIAGNOSIS — I2510 Atherosclerotic heart disease of native coronary artery without angina pectoris: Secondary | ICD-10-CM | POA: Insufficient documentation

## 2016-01-18 ENCOUNTER — Telehealth: Payer: Self-pay

## 2016-01-18 DIAGNOSIS — I714 Abdominal aortic aneurysm, without rupture, unspecified: Secondary | ICD-10-CM

## 2016-01-18 NOTE — Telephone Encounter (Signed)
-----   Message from Larey Dresser, MD sent at 01/18/2016  9:01 AM EDT ----- AAA increased to 4.1 cm.  Will need repeat study in 6 months to follow more closely.  Not yet to dimension where it would be repaired.

## 2016-01-18 NOTE — Telephone Encounter (Signed)
Informed patient of results and verbal understanding expressed.  AAA duplex ordered to be scheduled in 6 months. Patient agrees with treatment plan and was grateful for call.

## 2016-04-25 ENCOUNTER — Encounter: Payer: Self-pay | Admitting: *Deleted

## 2016-04-25 ENCOUNTER — Ambulatory Visit (INDEPENDENT_AMBULATORY_CARE_PROVIDER_SITE_OTHER): Payer: Medicare Other | Admitting: Cardiology

## 2016-04-25 ENCOUNTER — Encounter: Payer: Self-pay | Admitting: Cardiology

## 2016-04-25 ENCOUNTER — Encounter (INDEPENDENT_AMBULATORY_CARE_PROVIDER_SITE_OTHER): Payer: Self-pay

## 2016-04-25 VITALS — BP 126/64 | HR 74 | Ht 75.5 in | Wt 230.0 lb

## 2016-04-25 DIAGNOSIS — I350 Nonrheumatic aortic (valve) stenosis: Secondary | ICD-10-CM

## 2016-04-25 DIAGNOSIS — I2510 Atherosclerotic heart disease of native coronary artery without angina pectoris: Secondary | ICD-10-CM | POA: Diagnosis not present

## 2016-04-25 DIAGNOSIS — I6523 Occlusion and stenosis of bilateral carotid arteries: Secondary | ICD-10-CM

## 2016-04-25 DIAGNOSIS — I714 Abdominal aortic aneurysm, without rupture, unspecified: Secondary | ICD-10-CM

## 2016-04-25 DIAGNOSIS — I6529 Occlusion and stenosis of unspecified carotid artery: Secondary | ICD-10-CM | POA: Diagnosis not present

## 2016-04-25 NOTE — Patient Instructions (Signed)
Medication Instructions:  Your physician recommends that you continue on your current medications as directed. Please refer to the Current Medication list given to you today.   Labwork: None   Testing/Procedures: Your physician has requested that you have a carotid duplex. This test is an ultrasound of the carotid arteries in your neck. It looks at blood flow through these arteries that supply the brain with blood. Allow one hour for this exam. There are no restrictions or special instructions.  Your physician has requested that you have an echocardiogram. Echocardiography is a painless test that uses sound waves to create images of your heart. It provides your doctor with information about the size and shape of your heart and how well your heart's chambers and valves are working. This procedure takes approximately one hour. There are no restrictions for this procedure.  Your physician has requested that you have an abdominal aorta duplex. During this test, an ultrasound is used to evaluate the aorta. Allow 30 minutes for this exam. Do not eat after midnight the day before and avoid carbonated beverages October 2017   Follow-Up: Your physician wants you to follow-up in: 1 year with Dr Aundra Dubin in the [facility] at [facility]. (August 2018). You will receive a reminder letter in the mail two months in advance. If you don't receive a letter, please call our office to schedule the follow-up appointment.       If you need a refill on your cardiac medications before your next appointment, please call your pharmacy.

## 2016-04-26 NOTE — Progress Notes (Signed)
Patient ID: Eric Gordon, male   DOB: July 26, 1931, 82 y.o.   MRN: [id number] PCP: Dr. Virgina Jock  82 yo with history of CAD s/p CABG presents for cardiology followup.  Prior to CABG, patient had significant exertional dyspnea.  He has had trouble long-term with exertional dyspnea.  PFTs in 3/14 showed only mild obstructive airways disease and V/Q scan showed no PE.  Echo in 9/14 showed normal LV systolic function with moderately dilated RV. [place] in 2/15 showed normal right and left heart filling pressures and normal PA pressure.  Finally, he had a CPX in 3/15 that showed normal capacity compared to age-matched sedentary norms.  He was noted to have chronotropic incompetence.  At a prior appointment, I took him off metoprolol given chronotropic incompetence noted on CPX.  Dyspnea improved significantly with weight loss.   Eric Gordon is stable today.  Mild dyspnea with heavy exertion.  Stays active, exercises several times a week.  No particular limitation.  No chest pain. BP controlled.  He is no longer taking Lasix.     Labs (2/15): K 4.2, creatinine 1.2, LDL 87, HDL 39, BNP 71 Labs (4/15): K 3.9, creatinine 1.1 Labs (12/15): LDL 73, HDL 37, K 3.9, creatinine 1.0 Labs (12/16): LDL 71, HDL 49 Labs (3/17): K 4.2, creatinine 1.28, HCT 45  ECG: NSR, RBBB, LAFB  PMH: 1. Essential tremor 2. CAD: s/p CABG in 3/12.   3. Atrial fibrillation: Only noted post-op CABG.  4. PE: Post-op CABG in 2012.  5. AAA: 3.6 cm on [place] in 3/14.  3.6 cm on [place] in 3/15. 3.6 cm on [place] 8/16. 4.1 cm on [place] 4/17.  6. OSA: On CPAP.  7. Carotid stenosis: Carotid dopplers (123456) with A999333 LICA stenosis.  Carotid dopplers (99991111) with A999333 LICA stenosis. Carotid dopplers (3/16) with 60-79% RCIA stenosis, 123456 LICA stenosis.  - Carotid dopplers (8/16) with 40-59% RICA stenosis, A999333 LICA stenosis.  8. Asthma 9. PNA x 2 10. Dyspnea: Echo (9/14) with EF 60-65%, mild LVH, very mild AS with mean gradient 10 mmHg, RV moderately  dilated.  PFTs (3/14) showed mild obstructive airways disease.  V/Q scan (3/14) with no PE.  ENT workup for upper respiratory causes was negative.  RHC (2/15) with mean RA 7, PA 32/12, mean PCWP 13, CI 3.18.  CPX (3/15) with peak VO2 16.4, VE/VCO2 33; normal when compared to age-matched sedentary normals; chronotropic incompetence was noted.  Dyspnea was improved with weight loss.  11. HTN 12. Aortic stenosis: Mild.   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   FH: CAD.  Father had ruptured AAA.  Brothers also had AAA.  ROS: All systems reviewed and negative except as per HPI.   Current Outpatient Prescriptions  Medication Sig Dispense Refill  . aspirin 81 MG tablet Take 81 mg by mouth daily.      Marland Kitchen atorvastatin (LIPITOR) 40 MG tablet Take 40 mg by mouth daily.    . cetirizine (ZYRTEC) 10 MG tablet Take 10 mg by mouth daily.      . cholecalciferol (VITAMIN D) 1000 UNITS tablet Take 1,000 Units by mouth daily.      Marland Kitchen doxycycline (VIBRAMYCIN) 100 MG capsule Take 1 capsule (100 mg total) by mouth 2 (two) times daily. 20 capsule 0  . levothyroxine (SYNTHROID, LEVOTHROID) 50 MCG tablet Take 50 mcg by mouth daily.    Marland Kitchen losartan (COZAAR) 50 MG tablet Take 1 tablet (50 mg total) by mouth daily. 90 tablet 3  .  Multiple Vitamins-Minerals (CENTRUM SILVER PO) Take 1 tablet by mouth daily.     . pantoprazole (PROTONIX) 40 MG tablet Take 40 mg by mouth daily.     . predniSONE (DELTASONE) 50 MG tablet 1 tablet daily for 7 days 7 tablet 0  . Tamsulosin HCl (FLOMAX) 0.4 MG CAPS Take 1 capsule (0.4 mg total) by mouth daily. 90 capsule 3  . vitamin E 400 UNIT capsule Take 400 Units by mouth daily.     No current facility-administered medications for this visit.     BP 126/64   Pulse 74   Ht 6' 3.5" (1.918 m)   Wt 230 lb (104.3 kg)   BMI 28.37 kg/m  General: NAD Neck: No JVD, no thyromegaly or thyroid nodule.  Lungs: Clear to auscultation bilaterally with normal respiratory  effort. CV: Nondisplaced PMI.  Heart regular S1/S2, no S3/S4, 2/6 early SEM with clear S2.  No peripheral edema.  Bilateral carotid bruits.  Normal pedal pulses.  Abdomen: Soft, nontender, no hepatosplenomegaly, no distention.  Skin: Intact without lesions or rashes.  Neurologic: Alert and oriented x 3.  Psych: Normal affect. Extremities: No clubbing or cyanosis.   Assessment/Plan: 1. CAD: s/p CABG 3/12.  No chest pain.  Continue ASA 81 and statin.   2. Hyperlipidemia: Check lipids in 12/17.    3. Exertional dyspnea:  This appeared to mostly resolve with exercise and weight loss.   4. HTN: BP controlled, continue losartan.   5. Carotid stenosis: Moderate, patient is due for repeat carotid dopplers, I will arrange.   6. AAA: AAA was larger on [place] in 4/17.  He has a strong family history of AAA.  Will repeat abdominal US in 10/17.  .  7. Aortic stenosis: Mild on last echo.  I will get an echo to reassess AS this year.   He will followup with me in 1 year at the heart/vascular center clinic.    [name] 04/26/2016

## 2016-05-01 ENCOUNTER — Ambulatory Visit (HOSPITAL_COMMUNITY)
Admission: RE | Admit: 2016-05-01 | Discharge: 2016-05-01 | Disposition: A | Discharge status: Home / Self Care | Payer: Medicare Other | Source: Ambulatory Visit | Attending: Cardiology | Admitting: Cardiology

## 2016-05-01 ENCOUNTER — Other Ambulatory Visit (HOSPITAL_COMMUNITY): Payer: Medicare Other

## 2016-05-01 DIAGNOSIS — I10 Essential (primary) hypertension: Secondary | ICD-10-CM | POA: Diagnosis not present

## 2016-05-01 DIAGNOSIS — I350 Nonrheumatic aortic (valve) stenosis: Secondary | ICD-10-CM | POA: Insufficient documentation

## 2016-05-01 DIAGNOSIS — I714 Abdominal aortic aneurysm, without rupture, unspecified: Secondary | ICD-10-CM

## 2016-05-01 DIAGNOSIS — I6529 Occlusion and stenosis of unspecified carotid artery: Secondary | ICD-10-CM

## 2016-05-01 DIAGNOSIS — Z951 Presence of aortocoronary bypass graft: Secondary | ICD-10-CM | POA: Diagnosis not present

## 2016-05-01 DIAGNOSIS — I2510 Atherosclerotic heart disease of native coronary artery without angina pectoris: Secondary | ICD-10-CM | POA: Insufficient documentation

## 2016-05-01 DIAGNOSIS — I6523 Occlusion and stenosis of bilateral carotid arteries: Secondary | ICD-10-CM | POA: Insufficient documentation

## 2016-05-01 DIAGNOSIS — K219 Gastro-esophageal reflux disease without esophagitis: Secondary | ICD-10-CM | POA: Diagnosis not present

## 2016-05-01 DIAGNOSIS — G4730 Sleep apnea, unspecified: Secondary | ICD-10-CM | POA: Insufficient documentation

## 2016-05-02 ENCOUNTER — Telehealth: Payer: Self-pay | Admitting: Cardiology

## 2016-05-02 NOTE — Telephone Encounter (Signed)
New message ° ° ° °Returning nurse call. Please call.  °

## 2016-05-02 NOTE — Telephone Encounter (Signed)
The pt has been given his Carotid Duplex results and he verbalized understanding.

## 2016-05-06 ENCOUNTER — Ambulatory Visit (HOSPITAL_COMMUNITY): Payer: Medicare Other | Attending: Cardiovascular Disease

## 2016-05-06 ENCOUNTER — Other Ambulatory Visit: Payer: Self-pay

## 2016-05-06 DIAGNOSIS — I2510 Atherosclerotic heart disease of native coronary artery without angina pectoris: Secondary | ICD-10-CM | POA: Insufficient documentation

## 2016-05-06 DIAGNOSIS — G4733 Obstructive sleep apnea (adult) (pediatric): Secondary | ICD-10-CM | POA: Diagnosis not present

## 2016-05-06 DIAGNOSIS — I359 Nonrheumatic aortic valve disorder, unspecified: Secondary | ICD-10-CM | POA: Diagnosis present

## 2016-05-06 DIAGNOSIS — I119 Hypertensive heart disease without heart failure: Secondary | ICD-10-CM | POA: Insufficient documentation

## 2016-05-06 DIAGNOSIS — I350 Nonrheumatic aortic (valve) stenosis: Secondary | ICD-10-CM | POA: Diagnosis not present

## 2016-05-06 DIAGNOSIS — I6529 Occlusion and stenosis of unspecified carotid artery: Secondary | ICD-10-CM | POA: Diagnosis not present

## 2016-05-06 DIAGNOSIS — I714 Abdominal aortic aneurysm, without rupture, unspecified: Secondary | ICD-10-CM

## 2016-06-18 ENCOUNTER — Other Ambulatory Visit: Payer: Self-pay | Admitting: Cardiology

## 2016-06-18 DIAGNOSIS — I714 Abdominal aortic aneurysm, without rupture, unspecified: Secondary | ICD-10-CM

## 2016-06-18 DIAGNOSIS — I350 Nonrheumatic aortic (valve) stenosis: Secondary | ICD-10-CM

## 2016-06-24 DIAGNOSIS — Z23 Encounter for immunization: Secondary | ICD-10-CM | POA: Diagnosis not present

## 2016-06-30 ENCOUNTER — Other Ambulatory Visit (HOSPITAL_COMMUNITY): Payer: Medicare Other

## 2016-07-08 DIAGNOSIS — E119 Type 2 diabetes mellitus without complications: Secondary | ICD-10-CM | POA: Diagnosis not present

## 2016-07-08 DIAGNOSIS — I5189 Other ill-defined heart diseases: Secondary | ICD-10-CM | POA: Diagnosis not present

## 2016-07-08 DIAGNOSIS — M47899 Other spondylosis, site unspecified: Secondary | ICD-10-CM | POA: Diagnosis not present

## 2016-07-08 DIAGNOSIS — E785 Hyperlipidemia, unspecified: Secondary | ICD-10-CM | POA: Diagnosis not present

## 2016-07-08 DIAGNOSIS — E038 Other specified hypothyroidism: Secondary | ICD-10-CM | POA: Diagnosis not present

## 2016-07-08 DIAGNOSIS — I119 Hypertensive heart disease without heart failure: Secondary | ICD-10-CM | POA: Diagnosis not present

## 2016-07-08 DIAGNOSIS — I714 Abdominal aortic aneurysm, without rupture: Secondary | ICD-10-CM | POA: Diagnosis not present

## 2016-07-08 DIAGNOSIS — Z6828 Body mass index (BMI) 28.0-28.9, adult: Secondary | ICD-10-CM | POA: Diagnosis not present

## 2016-07-08 DIAGNOSIS — I350 Nonrheumatic aortic (valve) stenosis: Secondary | ICD-10-CM | POA: Diagnosis not present

## 2016-07-08 DIAGNOSIS — I2510 Atherosclerotic heart disease of native coronary artery without angina pectoris: Secondary | ICD-10-CM | POA: Diagnosis not present

## 2016-07-08 DIAGNOSIS — I6529 Occlusion and stenosis of unspecified carotid artery: Secondary | ICD-10-CM | POA: Diagnosis not present

## 2016-07-08 DIAGNOSIS — E784 Other hyperlipidemia: Secondary | ICD-10-CM | POA: Diagnosis not present

## 2016-07-09 DIAGNOSIS — M5136 Other intervertebral disc degeneration, lumbar region: Secondary | ICD-10-CM | POA: Diagnosis not present

## 2016-07-17 ENCOUNTER — Ambulatory Visit (HOSPITAL_COMMUNITY)
Admission: RE | Admit: 2016-07-17 | Discharge: 2016-07-17 | Disposition: A | Discharge status: Home / Self Care | Payer: Medicare Other | Source: Ambulatory Visit | Attending: Cardiovascular Disease | Admitting: Cardiovascular Disease

## 2016-07-17 DIAGNOSIS — I350 Nonrheumatic aortic (valve) stenosis: Secondary | ICD-10-CM | POA: Insufficient documentation

## 2016-07-17 DIAGNOSIS — I714 Abdominal aortic aneurysm, without rupture, unspecified: Secondary | ICD-10-CM

## 2016-07-21 ENCOUNTER — Telehealth: Payer: Self-pay | Admitting: Cardiology

## 2016-07-21 NOTE — Telephone Encounter (Signed)
F/u message  Pt returning RN call about test results. Please call back to discuss

## 2016-07-21 NOTE — Telephone Encounter (Signed)
Spoke with patient about results of abdominal duplex done last week.

## 2016-10-13 ENCOUNTER — Other Ambulatory Visit: Payer: Self-pay | Admitting: Cardiology

## 2016-10-13 DIAGNOSIS — I6523 Occlusion and stenosis of bilateral carotid arteries: Secondary | ICD-10-CM

## 2016-10-13 DIAGNOSIS — I714 Abdominal aortic aneurysm, without rupture, unspecified: Secondary | ICD-10-CM

## 2016-10-30 DIAGNOSIS — D1801 Hemangioma of skin and subcutaneous tissue: Secondary | ICD-10-CM | POA: Diagnosis not present

## 2016-10-30 DIAGNOSIS — L570 Actinic keratosis: Secondary | ICD-10-CM | POA: Diagnosis not present

## 2016-10-30 DIAGNOSIS — L820 Inflamed seborrheic keratosis: Secondary | ICD-10-CM | POA: Diagnosis not present

## 2016-10-30 DIAGNOSIS — D225 Melanocytic nevi of trunk: Secondary | ICD-10-CM | POA: Diagnosis not present

## 2016-10-30 DIAGNOSIS — L821 Other seborrheic keratosis: Secondary | ICD-10-CM | POA: Diagnosis not present

## 2016-10-30 DIAGNOSIS — L400 Psoriasis vulgaris: Secondary | ICD-10-CM | POA: Diagnosis not present

## 2016-12-03 DIAGNOSIS — M5136 Other intervertebral disc degeneration, lumbar region: Secondary | ICD-10-CM | POA: Diagnosis not present

## 2017-01-05 DIAGNOSIS — E784 Other hyperlipidemia: Secondary | ICD-10-CM | POA: Diagnosis not present

## 2017-01-05 DIAGNOSIS — E119 Type 2 diabetes mellitus without complications: Secondary | ICD-10-CM | POA: Diagnosis not present

## 2017-01-05 DIAGNOSIS — Z125 Encounter for screening for malignant neoplasm of prostate: Secondary | ICD-10-CM | POA: Diagnosis not present

## 2017-01-05 DIAGNOSIS — E038 Other specified hypothyroidism: Secondary | ICD-10-CM | POA: Diagnosis not present

## 2017-01-12 DIAGNOSIS — R808 Other proteinuria: Secondary | ICD-10-CM | POA: Diagnosis not present

## 2017-01-12 DIAGNOSIS — Z0000 Encounter for general adult medical examination without abnormal findings: Secondary | ICD-10-CM | POA: Diagnosis not present

## 2017-01-12 DIAGNOSIS — I350 Nonrheumatic aortic (valve) stenosis: Secondary | ICD-10-CM | POA: Diagnosis not present

## 2017-01-12 DIAGNOSIS — Z1389 Encounter for screening for other disorder: Secondary | ICD-10-CM | POA: Diagnosis not present

## 2017-01-12 DIAGNOSIS — I119 Hypertensive heart disease without heart failure: Secondary | ICD-10-CM | POA: Diagnosis not present

## 2017-01-12 DIAGNOSIS — Z6829 Body mass index (BMI) 29.0-29.9, adult: Secondary | ICD-10-CM | POA: Diagnosis not present

## 2017-01-12 DIAGNOSIS — E668 Other obesity: Secondary | ICD-10-CM | POA: Diagnosis not present

## 2017-01-12 DIAGNOSIS — E038 Other specified hypothyroidism: Secondary | ICD-10-CM | POA: Diagnosis not present

## 2017-01-12 DIAGNOSIS — I714 Abdominal aortic aneurysm, without rupture: Secondary | ICD-10-CM | POA: Diagnosis not present

## 2017-01-12 DIAGNOSIS — I6529 Occlusion and stenosis of unspecified carotid artery: Secondary | ICD-10-CM | POA: Diagnosis not present

## 2017-01-12 DIAGNOSIS — E119 Type 2 diabetes mellitus without complications: Secondary | ICD-10-CM | POA: Diagnosis not present

## 2017-01-12 DIAGNOSIS — I5189 Other ill-defined heart diseases: Secondary | ICD-10-CM | POA: Diagnosis not present

## 2017-01-13 DIAGNOSIS — Z1212 Encounter for screening for malignant neoplasm of rectum: Secondary | ICD-10-CM | POA: Diagnosis not present

## 2017-01-26 ENCOUNTER — Other Ambulatory Visit: Payer: Self-pay | Admitting: Cardiology

## 2017-01-26 DIAGNOSIS — I714 Abdominal aortic aneurysm, without rupture, unspecified: Secondary | ICD-10-CM

## 2017-02-04 DIAGNOSIS — Z1212 Encounter for screening for malignant neoplasm of rectum: Secondary | ICD-10-CM | POA: Diagnosis not present

## 2017-02-05 ENCOUNTER — Ambulatory Visit (HOSPITAL_COMMUNITY)
Admission: RE | Admit: 2017-02-05 | Discharge: 2017-02-05 | Disposition: A | Discharge status: Home / Self Care | Payer: Medicare Other | Source: Ambulatory Visit | Attending: Cardiology | Admitting: Cardiology

## 2017-02-05 DIAGNOSIS — I708 Atherosclerosis of other arteries: Secondary | ICD-10-CM | POA: Insufficient documentation

## 2017-02-05 DIAGNOSIS — I700 Atherosclerosis of aorta: Secondary | ICD-10-CM | POA: Insufficient documentation

## 2017-02-05 DIAGNOSIS — I714 Abdominal aortic aneurysm, without rupture, unspecified: Secondary | ICD-10-CM

## 2017-02-07 DIAGNOSIS — M1712 Unilateral primary osteoarthritis, left knee: Secondary | ICD-10-CM | POA: Diagnosis not present

## 2017-03-20 DIAGNOSIS — M1712 Unilateral primary osteoarthritis, left knee: Secondary | ICD-10-CM | POA: Diagnosis not present

## 2017-03-28 DIAGNOSIS — M1712 Unilateral primary osteoarthritis, left knee: Secondary | ICD-10-CM | POA: Diagnosis not present

## 2017-04-16 DIAGNOSIS — M1712 Unilateral primary osteoarthritis, left knee: Secondary | ICD-10-CM | POA: Diagnosis not present

## 2017-04-22 ENCOUNTER — Encounter (HOSPITAL_COMMUNITY): Payer: Self-pay | Admitting: Cardiology

## 2017-04-22 ENCOUNTER — Telehealth (HOSPITAL_COMMUNITY): Payer: Self-pay

## 2017-04-22 ENCOUNTER — Ambulatory Visit (HOSPITAL_COMMUNITY)
Admission: RE | Admit: 2017-04-22 | Discharge: 2017-04-22 | Disposition: A | Discharge status: Home / Self Care | Payer: Medicare Other | Source: Ambulatory Visit | Attending: Cardiology | Admitting: Cardiology

## 2017-04-22 VITALS — BP 109/74 | HR 55 | Wt 228.8 lb

## 2017-04-22 DIAGNOSIS — Z87891 Personal history of nicotine dependence: Secondary | ICD-10-CM | POA: Insufficient documentation

## 2017-04-22 DIAGNOSIS — Z951 Presence of aortocoronary bypass graft: Secondary | ICD-10-CM | POA: Insufficient documentation

## 2017-04-22 DIAGNOSIS — I2510 Atherosclerotic heart disease of native coronary artery without angina pectoris: Secondary | ICD-10-CM | POA: Diagnosis not present

## 2017-04-22 DIAGNOSIS — E785 Hyperlipidemia, unspecified: Secondary | ICD-10-CM | POA: Diagnosis not present

## 2017-04-22 DIAGNOSIS — Z8249 Family history of ischemic heart disease and other diseases of the circulatory system: Secondary | ICD-10-CM | POA: Diagnosis not present

## 2017-04-22 DIAGNOSIS — R0609 Other forms of dyspnea: Secondary | ICD-10-CM | POA: Diagnosis not present

## 2017-04-22 DIAGNOSIS — Z79899 Other long term (current) drug therapy: Secondary | ICD-10-CM | POA: Insufficient documentation

## 2017-04-22 DIAGNOSIS — I6529 Occlusion and stenosis of unspecified carotid artery: Secondary | ICD-10-CM | POA: Insufficient documentation

## 2017-04-22 DIAGNOSIS — I714 Abdominal aortic aneurysm, without rupture, unspecified: Secondary | ICD-10-CM

## 2017-04-22 DIAGNOSIS — I10 Essential (primary) hypertension: Secondary | ICD-10-CM | POA: Diagnosis not present

## 2017-04-22 DIAGNOSIS — R0602 Shortness of breath: Secondary | ICD-10-CM

## 2017-04-22 DIAGNOSIS — Z7982 Long term (current) use of aspirin: Secondary | ICD-10-CM | POA: Insufficient documentation

## 2017-04-22 DIAGNOSIS — I350 Nonrheumatic aortic (valve) stenosis: Secondary | ICD-10-CM | POA: Diagnosis not present

## 2017-04-22 DIAGNOSIS — G250 Essential tremor: Secondary | ICD-10-CM | POA: Diagnosis not present

## 2017-04-22 LAB — CBC
HCT: 48.9 % (ref 39.0–52.0)
HEMOGLOBIN: 16.3 g/dL (ref 13.0–17.0)
MCH: 31.5 pg (ref 26.0–34.0)
MCHC: 33.3 g/dL (ref 30.0–36.0)
MCV: 94.4 fL (ref 78.0–100.0)
Platelets: 162 10*3/uL (ref 150–400)
RBC: 5.18 MIL/uL (ref 4.22–5.81)
RDW: 14.2 % (ref 11.5–15.5)
WBC: 9.1 10*3/uL (ref 4.0–10.5)

## 2017-04-22 LAB — TSH: TSH: 2.42 u[IU]/mL (ref 0.350–4.500)

## 2017-04-22 LAB — LIPID PANEL
CHOL/HDL RATIO: 3.5 ratio
Cholesterol: 144 mg/dL (ref 0–200)
HDL: 41 mg/dL (ref >40)
LDL Cholesterol: 82 mg/dL (ref 0–99)
Triglycerides: 104 mg/dL (ref <150)
VLDL: 21 mg/dL (ref 0–40)

## 2017-04-22 LAB — BASIC METABOLIC PANEL
ANION GAP: 8 (ref 5–15)
BUN: 15 mg/dL (ref 6–20)
CALCIUM: 9.5 mg/dL (ref 8.9–10.3)
CO2: 28 mmol/L (ref 22–32)
Chloride: 103 mmol/L (ref 101–111)
Creatinine, Ser: 1.05 mg/dL (ref 0.61–1.24)
GLUCOSE: 98 mg/dL (ref 65–99)
POTASSIUM: 4.1 mmol/L (ref 3.5–5.1)
Sodium: 139 mmol/L (ref 135–145)

## 2017-04-22 NOTE — Patient Instructions (Addendum)
Will schedule you for a Myoview (exercise stress test) at [facility]. Address: [address] ([address] Floor), [place], [place] [address]  Phone: [phone] Please wear comfortable clothes and shoes for this test. Avoid heavy meal before the test (light snack/meal recommended). Avoid caffeine, alcohol, tobacco products 8 hrs before test. Please give 24 hr notice for cancellations/rescheduling: [phone].  Routine lab work today. Will notify you of abnormal results, otherwise no news is good news!  Follow up 3-4 months with Dr. Aundra Dubin. Take all medication as prescribed the day of your appointment. Bring all medications with you to your appointment.  Do the following things EVERYDAY: 1) Weigh yourself in the morning before breakfast. Write it down and keep it in a log. 2) Take your medicines as prescribed 3) Eat low salt foods-Limit salt (sodium) to 2000 mg per day.  4) Stay as active as you can everyday 5) Limit all fluids for the day to less than 2 liters

## 2017-04-23 ENCOUNTER — Ambulatory Visit (HOSPITAL_COMMUNITY)
Admission: RE | Admit: 2017-04-23 | Discharge: 2017-04-23 | Disposition: A | Discharge status: Home / Self Care | Payer: Medicare Other | Source: Ambulatory Visit | Attending: Cardiology | Admitting: Cardiology

## 2017-04-23 DIAGNOSIS — I714 Abdominal aortic aneurysm, without rupture: Secondary | ICD-10-CM | POA: Insufficient documentation

## 2017-04-23 DIAGNOSIS — Z8249 Family history of ischemic heart disease and other diseases of the circulatory system: Secondary | ICD-10-CM | POA: Diagnosis not present

## 2017-04-23 DIAGNOSIS — Z951 Presence of aortocoronary bypass graft: Secondary | ICD-10-CM | POA: Insufficient documentation

## 2017-04-23 DIAGNOSIS — I4510 Unspecified right bundle-branch block: Secondary | ICD-10-CM | POA: Insufficient documentation

## 2017-04-23 DIAGNOSIS — J45909 Unspecified asthma, uncomplicated: Secondary | ICD-10-CM | POA: Insufficient documentation

## 2017-04-23 DIAGNOSIS — I2510 Atherosclerotic heart disease of native coronary artery without angina pectoris: Secondary | ICD-10-CM | POA: Diagnosis not present

## 2017-04-23 DIAGNOSIS — I4891 Unspecified atrial fibrillation: Secondary | ICD-10-CM | POA: Diagnosis not present

## 2017-04-23 DIAGNOSIS — R0602 Shortness of breath: Secondary | ICD-10-CM

## 2017-04-23 LAB — MYOCARDIAL PERFUSION IMAGING
CHL CUP NUCLEAR SDS: 0
CHL RATE OF PERCEIVED EXERTION: 18
Estimated workload: 7 METS
Exercise duration (min): 5 min
Exercise duration (sec): 31 s
LV sys vol: 66 mL
LVDIAVOL: 137 mL (ref 62–150)
MPHR: 137 {beats}/min
NUC STRESS TID: 1.02
Peak HR: 139 {beats}/min
Percent HR: 101 %
Rest HR: 56 {beats}/min
SRS: 4
SSS: 4

## 2017-04-23 MED ORDER — TECHNETIUM TC 99M TETROFOSMIN IV KIT
10.4000 | PACK | Freq: Once | INTRAVENOUS | Status: AC | PRN
  Administered 2017-04-23: 10.4 via INTRAVENOUS
  Filled 2017-04-23: qty 11

## 2017-04-23 MED ORDER — TECHNETIUM TC 99M TETROFOSMIN IV KIT
31.2000 | PACK | Freq: Once | INTRAVENOUS | Status: AC | PRN
  Administered 2017-04-23: 31.2 via INTRAVENOUS
  Filled 2017-04-23: qty 32

## 2017-04-23 NOTE — Progress Notes (Signed)
Patient ID: DALLON DACOSTA, male   DOB: 04/30/1931, 83 y.o.   MRN: [id number] PCP: Dr. Virgina Jock Cardiology: Dr. Aundra Dubin  83 yo with history of CAD s/p CABG presents for cardiology followup.  Prior to CABG, patient had significant exertional dyspnea.  He has had trouble long-term with exertional dyspnea.  PFTs in 3/14 showed only mild obstructive airways disease and V/Q scan showed no PE.  Echo in 9/14 showed normal LV systolic function with moderately dilated RV. [place] in 2/15 showed normal right and left heart filling pressures and normal PA pressure.  Finally, he had a CPX in 3/15 that showed normal capacity compared to age-matched sedentary norms.  He was noted to have chronotropic incompetence.  At a prior appointment, I took him off metoprolol given chronotropic incompetence noted on CPX.  Dyspnea improved significantly with weight loss.   Today, he reports that his exertional dyspnea has worsened again for about the last month.  He is short of breath bending over or "moving fast."  No problems walking at a steady pace on flat ground.  Still walking regularly for exercise at the [facility], no dyspnea walking a mile on a treadmill.  He is short of breath with more strenuous exertion.  No chest pain.  Cuts his grass without problems.  No lightheadedness or palpitations.  Weight is stable .     Labs (2/15): K 4.2, creatinine 1.2, LDL 87, HDL 39, BNP 71 Labs (4/15): K 3.9, creatinine 1.1 Labs (12/15): LDL 73, HDL 37, K 3.9, creatinine 1.0 Labs (12/16): LDL 71, HDL 49 Labs (3/17): K 4.2, creatinine 1.28, HCT 45  ECG (personally reviewed): NSR, LAFB, RBBB (no change)  PMH: 1. Essential tremor 2. CAD: s/p CABG in 3/12.   3. Atrial fibrillation: Only noted post-op CABG.  4. PE: Post-op CABG in 2012.  5. AAA: 3.6 cm on [place] in 3/14.  3.6 cm on [place] in 3/15. 3.6 cm on [place] 8/16. 4.1 cm on [place] 4/17.  - AAA [place] (5/18): 4.1 cm AAA.  6. OSA: On CPAP.  7. Carotid stenosis: Carotid dopplers (6/31) with 49-70% LICA  stenosis.  Carotid dopplers (2/63) with 78-58% LICA stenosis. Carotid dopplers (3/16) with 60-79% RCIA stenosis, 85-02% LICA stenosis.  - Carotid dopplers (8/16) with 40-59% RICA stenosis, 77-41% LICA stenosis.  - Carotid dopplers (8/17) with 28-78% RICA, 67-67% LICA.  8. Asthma 9. PNA x 2 10. Dyspnea: Echo (9/14) with EF 60-65%, mild LVH, very mild AS with mean gradient 10 mmHg, RV moderately dilated.  PFTs (3/14) showed mild obstructive airways disease.  V/Q scan (3/14) with no PE.  ENT workup for upper respiratory causes was negative.  RHC (2/15) with mean RA 7, PA 32/12, mean PCWP 13, CI 3.18.  CPX (3/15) with peak VO2 16.4, VE/VCO2 33; normal when compared to age-matched sedentary normals; chronotropic incompetence was noted.  Dyspnea was improved with weight loss.  - Echo (8/17): EF 60-65%, mild AS.  11. HTN 12. Aortic stenosis: Mild.   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   FH: CAD.  Father had ruptured AAA.  Brothers also had AAA.  ROS: All systems reviewed and negative except as per HPI.   Current Outpatient Prescriptions  Medication Sig Dispense Refill  . Ascorbic Acid 500 MG CAPS Take 500 mg by mouth daily.    Marland Kitchen aspirin 81 MG tablet Take 81 mg by mouth daily.      Marland Kitchen atorvastatin (LIPITOR) 40 MG tablet Take 40 mg by  mouth daily.    . cetirizine (ZYRTEC) 10 MG tablet Take 10 mg by mouth daily.      . cholecalciferol (VITAMIN D) 1000 UNITS tablet Take 1,000 Units by mouth daily.      Marland Kitchen levothyroxine (SYNTHROID, LEVOTHROID) 50 MCG tablet Take 50 mcg by mouth daily.    Marland Kitchen losartan (COZAAR) 50 MG tablet TAKE ONE TABLET BY MOUTH DAILY 90 tablet 3  . Multiple Vitamins-Minerals (CENTRUM SILVER PO) Take 1 tablet by mouth daily.     . pantoprazole (PROTONIX) 40 MG tablet Take 40 mg by mouth daily.     . Tamsulosin HCl (FLOMAX) 0.4 MG CAPS Take 1 capsule (0.4 mg total) by mouth daily. 90 capsule 3  . vitamin E 400 UNIT capsule Take 400 Units by mouth daily.      No current facility-administered medications for this encounter.     BP 109/74 (BP Location: Right Arm, Patient Position: Sitting, Cuff Size: Normal)   Pulse (!) 55   Wt 228 lb 12 oz (103.8 kg)   SpO2 94%   BMI 28.21 kg/m  General: NAD Neck: No JVD, no thyromegaly or thyroid nodule.  Lungs: Clear to auscultation bilaterally with normal respiratory effort. CV: Nondisplaced PMI.  Heart regular S1/S2, no S3/S4, 1/6 early SEM RUSB.  No peripheral edema.  No carotid bruit.  Normal pedal pulses.  Abdomen: Soft, nontender, no hepatosplenomegaly, no distention.  Skin: Intact without lesions or rashes.  Neurologic: Alert and oriented x 3.  Psych: Normal affect. Extremities: No clubbing or cyanosis.  HEENT: Normal.   Assessment/Plan: 1. CAD: s/p CABG 3/12. No chest pain but exertional dyspnea is worsening again.  - I will arrange for ETT-cardiolite to assess for ischemia.  - Continue ASA and statin.  2. Hyperlipidemia: Check lipids today.     3. Exertional dyspnea:  This was a problem in the past then improved with weight loss and exercise.  It has returned again but not as profoundly as in the past.  No ECG change, not volume overloaded on exam.  As above, will get ETT-Cardiolite.  Echo in 8/17 showed only mild AS.  If Cardiolite is normal, will encourage him to increase exercise level.    4. HTN: BP controlled, continue losartan.   5. Carotid stenosis: Moderate, patient is due for repeat carotid dopplers.  I will arrange. 6. AAA: Repeat AAA [place] in 5/19.   7. Aortic stenosis: Mild on last echo.    If Cardiolite is normal, followup in 3-4 months to reassess dyspnea.   [name] 04/23/2017

## 2017-04-23 NOTE — Telephone Encounter (Signed)
Encounter complete. 

## 2017-04-27 NOTE — Addendum Note (Signed)
Encounter addended by: Scarlette Calico, RN on: 04/27/2017 11:00 AM<BR>    Actions taken: Order list changed, Diagnosis association updated

## 2017-04-28 ENCOUNTER — Telehealth (HOSPITAL_COMMUNITY): Payer: Self-pay | Admitting: *Deleted

## 2017-04-28 NOTE — Telephone Encounter (Signed)
Myocardial Perfusion Imaging  Order: [id number]  Status:  Final result Visible to patient:  No (Not Released) Dx:  DYSPNEA  Notes recorded by Darron Doom, RN on 04/28/2017 at 12:17 PM EDT Called and spoke with patient, he is aware of results and no further questions. ------  Notes recorded by Larey Dresser, MD on 04/24/2017 at 12:23 AM EDT No perfusion defect, no evidence for ischemia or infarction.

## 2017-05-20 DIAGNOSIS — M545 Low back pain: Secondary | ICD-10-CM | POA: Diagnosis not present

## 2017-05-29 ENCOUNTER — Ambulatory Visit (HOSPITAL_COMMUNITY)
Admission: RE | Admit: 2017-05-29 | Discharge: 2017-05-29 | Disposition: A | Discharge status: Home / Self Care | Payer: Medicare Other | Source: Ambulatory Visit | Attending: Cardiovascular Disease | Admitting: Cardiovascular Disease

## 2017-05-29 DIAGNOSIS — I10 Essential (primary) hypertension: Secondary | ICD-10-CM | POA: Insufficient documentation

## 2017-05-29 DIAGNOSIS — Z951 Presence of aortocoronary bypass graft: Secondary | ICD-10-CM | POA: Insufficient documentation

## 2017-05-29 DIAGNOSIS — I6529 Occlusion and stenosis of unspecified carotid artery: Secondary | ICD-10-CM

## 2017-05-29 DIAGNOSIS — I6523 Occlusion and stenosis of bilateral carotid arteries: Secondary | ICD-10-CM | POA: Diagnosis not present

## 2017-05-29 DIAGNOSIS — Z87891 Personal history of nicotine dependence: Secondary | ICD-10-CM | POA: Diagnosis not present

## 2017-05-29 DIAGNOSIS — I2510 Atherosclerotic heart disease of native coronary artery without angina pectoris: Secondary | ICD-10-CM | POA: Diagnosis not present

## 2017-07-16 DIAGNOSIS — I5189 Other ill-defined heart diseases: Secondary | ICD-10-CM | POA: Diagnosis not present

## 2017-07-16 DIAGNOSIS — I5022 Chronic systolic (congestive) heart failure: Secondary | ICD-10-CM | POA: Diagnosis not present

## 2017-07-16 DIAGNOSIS — Z23 Encounter for immunization: Secondary | ICD-10-CM | POA: Diagnosis not present

## 2017-07-16 DIAGNOSIS — E038 Other specified hypothyroidism: Secondary | ICD-10-CM | POA: Diagnosis not present

## 2017-07-16 DIAGNOSIS — E119 Type 2 diabetes mellitus without complications: Secondary | ICD-10-CM | POA: Diagnosis not present

## 2017-07-16 DIAGNOSIS — Z6828 Body mass index (BMI) 28.0-28.9, adult: Secondary | ICD-10-CM | POA: Diagnosis not present

## 2017-07-16 DIAGNOSIS — I2510 Atherosclerotic heart disease of native coronary artery without angina pectoris: Secondary | ICD-10-CM | POA: Diagnosis not present

## 2017-07-16 DIAGNOSIS — I6529 Occlusion and stenosis of unspecified carotid artery: Secondary | ICD-10-CM | POA: Diagnosis not present

## 2017-07-16 DIAGNOSIS — R0609 Other forms of dyspnea: Secondary | ICD-10-CM | POA: Diagnosis not present

## 2017-07-16 DIAGNOSIS — E668 Other obesity: Secondary | ICD-10-CM | POA: Diagnosis not present

## 2017-07-16 DIAGNOSIS — I350 Nonrheumatic aortic (valve) stenosis: Secondary | ICD-10-CM | POA: Diagnosis not present

## 2017-07-16 DIAGNOSIS — E7849 Other hyperlipidemia: Secondary | ICD-10-CM | POA: Diagnosis not present

## 2017-07-17 ENCOUNTER — Other Ambulatory Visit: Payer: Self-pay | Admitting: *Deleted

## 2017-07-17 DIAGNOSIS — I714 Abdominal aortic aneurysm, without rupture, unspecified: Secondary | ICD-10-CM

## 2017-07-17 DIAGNOSIS — I6523 Occlusion and stenosis of bilateral carotid arteries: Secondary | ICD-10-CM

## 2017-07-27 ENCOUNTER — Encounter (HOSPITAL_COMMUNITY): Payer: Self-pay | Admitting: Cardiology

## 2017-07-27 ENCOUNTER — Ambulatory Visit (HOSPITAL_COMMUNITY)
Admission: RE | Admit: 2017-07-27 | Discharge: 2017-07-27 | Disposition: A | Discharge status: Home / Self Care | Payer: Medicare Other | Source: Ambulatory Visit | Attending: Cardiology | Admitting: Cardiology

## 2017-07-27 VITALS — BP 142/88 | HR 50 | Wt 233.0 lb

## 2017-07-27 DIAGNOSIS — Z8701 Personal history of pneumonia (recurrent): Secondary | ICD-10-CM | POA: Insufficient documentation

## 2017-07-27 DIAGNOSIS — Z79899 Other long term (current) drug therapy: Secondary | ICD-10-CM | POA: Diagnosis not present

## 2017-07-27 DIAGNOSIS — I6529 Occlusion and stenosis of unspecified carotid artery: Secondary | ICD-10-CM | POA: Diagnosis not present

## 2017-07-27 DIAGNOSIS — I714 Abdominal aortic aneurysm, without rupture, unspecified: Secondary | ICD-10-CM

## 2017-07-27 DIAGNOSIS — E785 Hyperlipidemia, unspecified: Secondary | ICD-10-CM | POA: Insufficient documentation

## 2017-07-27 DIAGNOSIS — Z7982 Long term (current) use of aspirin: Secondary | ICD-10-CM | POA: Insufficient documentation

## 2017-07-27 DIAGNOSIS — G250 Essential tremor: Secondary | ICD-10-CM | POA: Diagnosis not present

## 2017-07-27 DIAGNOSIS — Z951 Presence of aortocoronary bypass graft: Secondary | ICD-10-CM | POA: Diagnosis not present

## 2017-07-27 DIAGNOSIS — Z8249 Family history of ischemic heart disease and other diseases of the circulatory system: Secondary | ICD-10-CM | POA: Diagnosis not present

## 2017-07-27 DIAGNOSIS — I6523 Occlusion and stenosis of bilateral carotid arteries: Secondary | ICD-10-CM | POA: Insufficient documentation

## 2017-07-27 DIAGNOSIS — R0609 Other forms of dyspnea: Secondary | ICD-10-CM | POA: Diagnosis not present

## 2017-07-27 DIAGNOSIS — I2510 Atherosclerotic heart disease of native coronary artery without angina pectoris: Secondary | ICD-10-CM

## 2017-07-27 DIAGNOSIS — I350 Nonrheumatic aortic (valve) stenosis: Secondary | ICD-10-CM | POA: Insufficient documentation

## 2017-07-27 DIAGNOSIS — Z87891 Personal history of nicotine dependence: Secondary | ICD-10-CM | POA: Insufficient documentation

## 2017-07-27 DIAGNOSIS — I10 Essential (primary) hypertension: Secondary | ICD-10-CM

## 2017-07-27 DIAGNOSIS — G4733 Obstructive sleep apnea (adult) (pediatric): Secondary | ICD-10-CM | POA: Diagnosis not present

## 2017-07-27 NOTE — Progress Notes (Signed)
Patient ID: Eric Gordon, male   DOB: 05-04-1931, 83 y.o.   MRN: [id number] PCP: Dr. Virgina Jock Cardiology: Dr. Aundra Dubin  83 yo with history of CAD s/p CABG presents for followup of CAD and dypsnea.  83 years old with history, patient had significant exertional dyspnea.  He has had trouble long-term with exertional dyspnea.  PFTs in 3/14 showed only mild obstructive airways disease and V/Q scan showed no PE.  Echo in 9/14 showed normal LV systolic function with moderately dilated RV. [place] in 2/15 showed normal right and left heart filling pressures and normal PA pressure.  Finally, he had a CPX in 3/15 that showed normal capacity compared to age-matched sedentary norms.  He was noted to have chronotropic incompetence.  At a prior appointment, I took him off metoprolol given chronotropic incompetence noted on CPX.  Dyspnea improved significantly with weight loss.  He had Cardiolite in 8/18 with EF 52%, no ischemia or infarction.   He is symptomatically stable.  Gets short of breath with bending over and fast walking. He works out at [employer] 4 days/week on exercise bike and treadmill, no dyspnea. He can walk a mile with no problems.  No chest pain. BP high today but he checks at home and it runs in the [id number] systolic.  Weight is up 5 lbs.    Labs (2/15): K 4.2, creatinine 1.2, LDL 87, HDL 39, BNP 71 Labs (4/15): K 3.9, creatinine 1.1 Labs (12/15): LDL 73, HDL 37, K 3.9, creatinine 1.0 Labs (12/16): LDL 71, HDL 49 Labs (3/17): K 4.2, creatinine 1.28, HCT 45 Labs (8/18): LDL 82, HDL 41, TSH normal, hgb 16.3, K 4.1, creatinine 1.05  PMH: 1. Essential tremor 2. CAD: s/p CABG in 3/12.   - Cardiolite (8/18): EF 52%, no ischemia/infarction.  3. Atrial fibrillation: Only noted post-op CABG.  4. PE: Post-op CABG in 2012.  5. AAA: 3.6 cm on [place] in 3/14.  3.6 cm on [place] in 3/15. 3.6 cm on [place] 8/16. 4.1 cm on [place] 4/17.  - AAA [place] (5/18): 4.1 cm AAA.  6. OSA: On CPAP.  7. Carotid stenosis: Carotid dopplers (3/08) with  65-78% LICA stenosis.  Carotid dopplers (4/69) with 62-95% LICA stenosis. Carotid dopplers (3/16) with 60-79% RCIA stenosis, 28-41% LICA stenosis.  - Carotid dopplers (8/16) with 40-59% RICA stenosis, 32-44% LICA stenosis.  - Carotid dopplers (8/17) with 01-02% RICA, 72-53% LICA. - Carotid dopplers (9/18) with 66-44% RICA, 03-47% LICA.   8. Asthma 9. PNA x 2 10. Dyspnea: Echo (9/14) with EF 60-65%, mild LVH, very mild AS with mean gradient 10 mmHg, RV moderately dilated.  PFTs (3/14) showed mild obstructive airways disease.  V/Q scan (3/14) with no PE.  ENT workup for upper respiratory causes was negative.  RHC (2/15) with mean RA 7, PA 32/12, mean PCWP 13, CI 3.18.  CPX (3/15) with peak VO2 16.4, VE/VCO2 33; normal when compared to age-matched sedentary normals; chronotropic incompetence was noted.  Dyspnea was improved with weight loss.  - Echo (8/17): EF 60-65%, mild AS.  11. HTN 12. Aortic stenosis: Mild.   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   FH: CAD.  Father had ruptured AAA.  Brothers also had AAA.  ROS: All systems reviewed and negative except as per HPI.   Current Outpatient Medications  Medication Sig Dispense Refill  . Ascorbic Acid 500 MG CAPS Take 500 mg by mouth daily.    Marland Kitchen aspirin 81 MG tablet Take 81 mg by  mouth daily.      Marland Kitchen atorvastatin (LIPITOR) 40 MG tablet Take 40 mg by mouth daily.    . cetirizine (ZYRTEC) 10 MG tablet Take 10 mg by mouth daily.      . cholecalciferol (VITAMIN D) 1000 UNITS tablet Take 1,000 Units by mouth daily.      Marland Kitchen levothyroxine (SYNTHROID, LEVOTHROID) 50 MCG tablet Take 50 mcg by mouth daily.    Marland Kitchen losartan (COZAAR) 50 MG tablet TAKE ONE TABLET BY MOUTH DAILY 90 tablet 3  . Multiple Vitamins-Minerals (CENTRUM SILVER PO) Take 1 tablet by mouth daily.     . pantoprazole (PROTONIX) 40 MG tablet Take 40 mg by mouth daily.     . vitamin E 400 UNIT capsule Take 400 Units by mouth daily.     No current  facility-administered medications for this encounter.     BP (!) 142/88   Pulse (!) 50   Wt 233 lb (105.7 kg)   SpO2 94%   BMI 28.36 kg/m  General: NAD Neck: No JVD, no thyromegaly or thyroid nodule.  Lungs: Clear to auscultation bilaterally with normal respiratory effort. CV: Nondisplaced PMI.  Heart regular S1/S2, no S3/S4, 1/6 early SEM RUSB.  Trace ankle edema.  Left carotid bruit.  Normal pedal pulses.  Abdomen: Soft, nontender, no hepatosplenomegaly, no distention.  Skin: Intact without lesions or rashes.  Neurologic: Alert and oriented x 3.  Psych: Normal affect. Extremities: No clubbing or cyanosis.  HEENT: Normal.    Assessment/Plan: 1. CAD: s/p CABG 3/12. Normal Cardiolite in 8/18.  Stable exertional dyspnea, no chest pain. - Continue ASA and statin.  2. Hyperlipidemia: Goal LDL < 70.  LDL was 82 in 8/18, will see if it can come down with diet and weight loss. - Recheck lipids in 6 months.  3. Exertional dyspnea:  This was a problem in the past then improved with weight loss and exercise.  It has returned again but not as profoundly as in the past.  Not volume overloaded on exam, recent Cardiolite was normal.   - Work on weight loss.    4. HTN: BP controlled, continue losartan (high in office but has been in normal range at home).  5. Carotid stenosis: Repeat in 9/19, moderate.  6. AAA: Repeat AAA [place] in 5/19.   7. Aortic stenosis: Mild on last echo.    Followup in 6 months.   [name] 07/27/2017

## 2017-07-27 NOTE — Patient Instructions (Signed)
Follow up in 6 months, please call us to schedule your appointment.  [phone], option 3

## 2017-10-13 DIAGNOSIS — M545 Low back pain: Secondary | ICD-10-CM | POA: Diagnosis not present

## 2017-10-14 ENCOUNTER — Other Ambulatory Visit: Payer: Self-pay | Admitting: Neurological Surgery

## 2017-10-14 DIAGNOSIS — M545 Low back pain: Secondary | ICD-10-CM

## 2017-10-18 ENCOUNTER — Ambulatory Visit
Admission: RE | Admit: 2017-10-18 | Discharge: 2017-10-18 | Disposition: A | Discharge status: Home / Self Care | Payer: Medicare Other | Source: Ambulatory Visit | Attending: Neurological Surgery | Admitting: Neurological Surgery

## 2017-10-18 DIAGNOSIS — M48061 Spinal stenosis, lumbar region without neurogenic claudication: Secondary | ICD-10-CM | POA: Diagnosis not present

## 2017-10-18 DIAGNOSIS — M5126 Other intervertebral disc displacement, lumbar region: Secondary | ICD-10-CM | POA: Diagnosis not present

## 2017-10-18 DIAGNOSIS — M545 Low back pain: Secondary | ICD-10-CM

## 2017-10-26 DIAGNOSIS — M545 Low back pain: Secondary | ICD-10-CM | POA: Diagnosis not present

## 2017-10-26 DIAGNOSIS — I10 Essential (primary) hypertension: Secondary | ICD-10-CM | POA: Diagnosis not present

## 2017-10-26 DIAGNOSIS — Z6828 Body mass index (BMI) 28.0-28.9, adult: Secondary | ICD-10-CM | POA: Diagnosis not present

## 2017-11-02 DIAGNOSIS — M545 Low back pain: Secondary | ICD-10-CM | POA: Diagnosis not present

## 2017-11-05 DIAGNOSIS — M545 Low back pain: Secondary | ICD-10-CM | POA: Diagnosis not present

## 2017-11-09 ENCOUNTER — Other Ambulatory Visit: Payer: Self-pay | Admitting: Cardiology

## 2017-11-09 DIAGNOSIS — I714 Abdominal aortic aneurysm, without rupture, unspecified: Secondary | ICD-10-CM

## 2017-11-09 DIAGNOSIS — I6523 Occlusion and stenosis of bilateral carotid arteries: Secondary | ICD-10-CM

## 2017-11-10 DIAGNOSIS — M545 Low back pain: Secondary | ICD-10-CM | POA: Diagnosis not present

## 2017-11-13 DIAGNOSIS — M545 Low back pain: Secondary | ICD-10-CM | POA: Diagnosis not present

## 2017-11-20 DIAGNOSIS — M545 Low back pain: Secondary | ICD-10-CM | POA: Diagnosis not present

## 2017-11-24 DIAGNOSIS — M545 Low back pain: Secondary | ICD-10-CM | POA: Diagnosis not present

## 2017-11-25 DIAGNOSIS — M5136 Other intervertebral disc degeneration, lumbar region: Secondary | ICD-10-CM | POA: Diagnosis not present

## 2017-11-26 DIAGNOSIS — M545 Low back pain: Secondary | ICD-10-CM | POA: Diagnosis not present

## 2017-12-01 DIAGNOSIS — M545 Low back pain: Secondary | ICD-10-CM | POA: Diagnosis not present

## 2017-12-04 DIAGNOSIS — M545 Low back pain: Secondary | ICD-10-CM | POA: Diagnosis not present

## 2017-12-08 DIAGNOSIS — M5136 Other intervertebral disc degeneration, lumbar region: Secondary | ICD-10-CM | POA: Diagnosis not present

## 2017-12-17 DIAGNOSIS — D1801 Hemangioma of skin and subcutaneous tissue: Secondary | ICD-10-CM | POA: Diagnosis not present

## 2017-12-17 DIAGNOSIS — L820 Inflamed seborrheic keratosis: Secondary | ICD-10-CM | POA: Diagnosis not present

## 2017-12-17 DIAGNOSIS — L821 Other seborrheic keratosis: Secondary | ICD-10-CM | POA: Diagnosis not present

## 2017-12-17 DIAGNOSIS — D225 Melanocytic nevi of trunk: Secondary | ICD-10-CM | POA: Diagnosis not present

## 2017-12-17 DIAGNOSIS — L814 Other melanin hyperpigmentation: Secondary | ICD-10-CM | POA: Diagnosis not present

## 2017-12-17 DIAGNOSIS — L570 Actinic keratosis: Secondary | ICD-10-CM | POA: Diagnosis not present

## 2017-12-17 DIAGNOSIS — L308 Other specified dermatitis: Secondary | ICD-10-CM | POA: Diagnosis not present

## 2018-01-12 DIAGNOSIS — Z125 Encounter for screening for malignant neoplasm of prostate: Secondary | ICD-10-CM | POA: Diagnosis not present

## 2018-01-12 DIAGNOSIS — E7849 Other hyperlipidemia: Secondary | ICD-10-CM | POA: Diagnosis not present

## 2018-01-12 DIAGNOSIS — E038 Other specified hypothyroidism: Secondary | ICD-10-CM | POA: Diagnosis not present

## 2018-01-12 DIAGNOSIS — R82998 Other abnormal findings in urine: Secondary | ICD-10-CM | POA: Diagnosis not present

## 2018-01-12 DIAGNOSIS — E119 Type 2 diabetes mellitus without complications: Secondary | ICD-10-CM | POA: Diagnosis not present

## 2018-01-18 DIAGNOSIS — I5189 Other ill-defined heart diseases: Secondary | ICD-10-CM | POA: Diagnosis not present

## 2018-01-18 DIAGNOSIS — I6529 Occlusion and stenosis of unspecified carotid artery: Secondary | ICD-10-CM | POA: Diagnosis not present

## 2018-01-18 DIAGNOSIS — Z0000 Encounter for general adult medical examination without abnormal findings: Secondary | ICD-10-CM | POA: Diagnosis not present

## 2018-01-18 DIAGNOSIS — I119 Hypertensive heart disease without heart failure: Secondary | ICD-10-CM | POA: Diagnosis not present

## 2018-01-18 DIAGNOSIS — E668 Other obesity: Secondary | ICD-10-CM | POA: Diagnosis not present

## 2018-01-18 DIAGNOSIS — Z6829 Body mass index (BMI) 29.0-29.9, adult: Secondary | ICD-10-CM | POA: Diagnosis not present

## 2018-01-18 DIAGNOSIS — Z1389 Encounter for screening for other disorder: Secondary | ICD-10-CM | POA: Diagnosis not present

## 2018-01-18 DIAGNOSIS — E119 Type 2 diabetes mellitus without complications: Secondary | ICD-10-CM | POA: Diagnosis not present

## 2018-01-18 DIAGNOSIS — I5022 Chronic systolic (congestive) heart failure: Secondary | ICD-10-CM | POA: Diagnosis not present

## 2018-01-18 DIAGNOSIS — I714 Abdominal aortic aneurysm, without rupture: Secondary | ICD-10-CM | POA: Diagnosis not present

## 2018-01-18 DIAGNOSIS — R808 Other proteinuria: Secondary | ICD-10-CM | POA: Diagnosis not present

## 2018-01-18 DIAGNOSIS — I350 Nonrheumatic aortic (valve) stenosis: Secondary | ICD-10-CM | POA: Diagnosis not present

## 2018-01-22 DIAGNOSIS — Z1212 Encounter for screening for malignant neoplasm of rectum: Secondary | ICD-10-CM | POA: Diagnosis not present

## 2018-02-16 ENCOUNTER — Ambulatory Visit (HOSPITAL_COMMUNITY)
Admission: RE | Admit: 2018-02-16 | Discharge: 2018-02-16 | Disposition: A | Discharge status: Home / Self Care | Payer: Medicare Other | Source: Ambulatory Visit | Attending: Cardiovascular Disease | Admitting: Cardiovascular Disease

## 2018-02-16 DIAGNOSIS — I10 Essential (primary) hypertension: Secondary | ICD-10-CM | POA: Insufficient documentation

## 2018-02-16 DIAGNOSIS — I714 Abdominal aortic aneurysm, without rupture, unspecified: Secondary | ICD-10-CM

## 2018-02-16 DIAGNOSIS — Z87891 Personal history of nicotine dependence: Secondary | ICD-10-CM | POA: Diagnosis not present

## 2018-02-16 DIAGNOSIS — I700 Atherosclerosis of aorta: Secondary | ICD-10-CM | POA: Insufficient documentation

## 2018-02-16 DIAGNOSIS — I2510 Atherosclerotic heart disease of native coronary artery without angina pectoris: Secondary | ICD-10-CM | POA: Insufficient documentation

## 2018-03-19 ENCOUNTER — Other Ambulatory Visit: Payer: Self-pay

## 2018-03-19 ENCOUNTER — Emergency Department (HOSPITAL_COMMUNITY): Payer: Medicare Other

## 2018-03-19 ENCOUNTER — Encounter (HOSPITAL_COMMUNITY): Payer: Self-pay | Admitting: General Practice

## 2018-03-19 ENCOUNTER — Inpatient Hospital Stay (HOSPITAL_COMMUNITY)
Admission: EM | Admit: 2018-03-19 | Discharge: 2018-03-23 | DRG: 193 | Disposition: A | Discharge status: Home / Self Care | Payer: Medicare Other | Attending: Internal Medicine | Admitting: Internal Medicine

## 2018-03-19 DIAGNOSIS — N189 Chronic kidney disease, unspecified: Secondary | ICD-10-CM | POA: Diagnosis present

## 2018-03-19 DIAGNOSIS — N400 Enlarged prostate without lower urinary tract symptoms: Secondary | ICD-10-CM | POA: Diagnosis present

## 2018-03-19 DIAGNOSIS — J189 Pneumonia, unspecified organism: Secondary | ICD-10-CM | POA: Diagnosis not present

## 2018-03-19 DIAGNOSIS — G4733 Obstructive sleep apnea (adult) (pediatric): Secondary | ICD-10-CM | POA: Diagnosis present

## 2018-03-19 DIAGNOSIS — J9601 Acute respiratory failure with hypoxia: Secondary | ICD-10-CM | POA: Diagnosis present

## 2018-03-19 DIAGNOSIS — I714 Abdominal aortic aneurysm, without rupture, unspecified: Secondary | ICD-10-CM | POA: Diagnosis present

## 2018-03-19 DIAGNOSIS — Z79899 Other long term (current) drug therapy: Secondary | ICD-10-CM

## 2018-03-19 DIAGNOSIS — G250 Essential tremor: Secondary | ICD-10-CM | POA: Diagnosis present

## 2018-03-19 DIAGNOSIS — E039 Hypothyroidism, unspecified: Secondary | ICD-10-CM | POA: Diagnosis present

## 2018-03-19 DIAGNOSIS — J4520 Mild intermittent asthma, uncomplicated: Secondary | ICD-10-CM | POA: Diagnosis present

## 2018-03-19 DIAGNOSIS — I2510 Atherosclerotic heart disease of native coronary artery without angina pectoris: Secondary | ICD-10-CM | POA: Diagnosis not present

## 2018-03-19 DIAGNOSIS — Z951 Presence of aortocoronary bypass graft: Secondary | ICD-10-CM | POA: Diagnosis not present

## 2018-03-19 DIAGNOSIS — R0602 Shortness of breath: Secondary | ICD-10-CM

## 2018-03-19 DIAGNOSIS — Z86711 Personal history of pulmonary embolism: Secondary | ICD-10-CM | POA: Diagnosis not present

## 2018-03-19 DIAGNOSIS — K219 Gastro-esophageal reflux disease without esophagitis: Secondary | ICD-10-CM | POA: Diagnosis present

## 2018-03-19 DIAGNOSIS — I4891 Unspecified atrial fibrillation: Secondary | ICD-10-CM | POA: Diagnosis present

## 2018-03-19 DIAGNOSIS — Z87891 Personal history of nicotine dependence: Secondary | ICD-10-CM | POA: Diagnosis not present

## 2018-03-19 DIAGNOSIS — Z7982 Long term (current) use of aspirin: Secondary | ICD-10-CM

## 2018-03-19 DIAGNOSIS — R0600 Dyspnea, unspecified: Secondary | ICD-10-CM | POA: Diagnosis not present

## 2018-03-19 DIAGNOSIS — R509 Fever, unspecified: Secondary | ICD-10-CM | POA: Diagnosis not present

## 2018-03-19 DIAGNOSIS — I129 Hypertensive chronic kidney disease with stage 1 through stage 4 chronic kidney disease, or unspecified chronic kidney disease: Secondary | ICD-10-CM | POA: Diagnosis present

## 2018-03-19 HISTORY — DX: Pure hypercholesterolemia, unspecified: E78.00

## 2018-03-19 HISTORY — DX: Dependence on other enabling machines and devices: Z99.89

## 2018-03-19 HISTORY — DX: Other chronic pain: G89.29

## 2018-03-19 HISTORY — DX: Low back pain, unspecified: M54.50

## 2018-03-19 HISTORY — DX: Obstructive sleep apnea (adult) (pediatric): G47.33

## 2018-03-19 HISTORY — DX: Hypothyroidism, unspecified: E03.9

## 2018-03-19 HISTORY — DX: Low back pain: M54.5

## 2018-03-19 HISTORY — DX: Pneumonia, unspecified organism: J18.9

## 2018-03-19 LAB — URINALYSIS, ROUTINE W REFLEX MICROSCOPIC
BILIRUBIN URINE: NEGATIVE
Bacteria, UA: NONE SEEN
GLUCOSE, UA: NEGATIVE mg/dL
HGB URINE DIPSTICK: NEGATIVE
Ketones, ur: 5 mg/dL — AB
Leukocytes, UA: NEGATIVE
NITRITE: NEGATIVE
PH: 5 (ref 5.0–8.0)
Protein, ur: 100 mg/dL — AB
Specific Gravity, Urine: 1.027 (ref 1.005–1.030)

## 2018-03-19 LAB — COMPREHENSIVE METABOLIC PANEL
ALBUMIN: 3.5 g/dL (ref 3.5–5.0)
ALK PHOS: 65 U/L (ref 38–126)
ALT: 14 U/L (ref 0–44)
AST: 22 U/L (ref 15–41)
Anion gap: 11 (ref 5–15)
BUN: 21 mg/dL (ref 8–23)
CALCIUM: 8.9 mg/dL (ref 8.9–10.3)
CO2: 27 mmol/L (ref 22–32)
Chloride: 97 mmol/L — ABNORMAL LOW (ref 98–111)
Creatinine, Ser: 1.26 mg/dL — ABNORMAL HIGH (ref 0.61–1.24)
GFR calc Af Amer: 59 mL/min — ABNORMAL LOW (ref >60)
GFR calc non Af Amer: 51 mL/min — ABNORMAL LOW (ref >60)
GLUCOSE: 116 mg/dL — AB (ref 70–99)
POTASSIUM: 3.8 mmol/L (ref 3.5–5.1)
SODIUM: 135 mmol/L (ref 135–145)
Total Bilirubin: 1.4 mg/dL — ABNORMAL HIGH (ref 0.3–1.2)
Total Protein: 7 g/dL (ref 6.5–8.1)

## 2018-03-19 LAB — INFLUENZA PANEL BY PCR (TYPE A & B)
INFLAPCR: NEGATIVE
INFLBPCR: NEGATIVE

## 2018-03-19 LAB — CBC WITH DIFFERENTIAL/PLATELET
ABS IMMATURE GRANULOCYTES: 0.1 10*3/uL (ref 0.0–0.1)
BASOS ABS: 0 10*3/uL (ref 0.0–0.1)
BASOS PCT: 0 %
EOS ABS: 0 10*3/uL (ref 0.0–0.7)
Eosinophils Relative: 0 %
HCT: 49 % (ref 39.0–52.0)
Hemoglobin: 15.5 g/dL (ref 13.0–17.0)
IMMATURE GRANULOCYTES: 1 %
LYMPHS PCT: 4 %
Lymphs Abs: 0.7 10*3/uL (ref 0.7–4.0)
MCH: 30.6 pg (ref 26.0–34.0)
MCHC: 31.6 g/dL (ref 30.0–36.0)
MCV: 96.6 fL (ref 78.0–100.0)
MONOS PCT: 9 %
Monocytes Absolute: 1.6 10*3/uL — ABNORMAL HIGH (ref 0.1–1.0)
Neutro Abs: 14.9 10*3/uL — ABNORMAL HIGH (ref 1.7–7.7)
Neutrophils Relative %: 86 %
Platelets: 133 10*3/uL — ABNORMAL LOW (ref 150–400)
RBC: 5.07 MIL/uL (ref 4.22–5.81)
RDW: 13.9 % (ref 11.5–15.5)
WBC: 17.3 10*3/uL — ABNORMAL HIGH (ref 4.0–10.5)

## 2018-03-19 LAB — BRAIN NATRIURETIC PEPTIDE: B NATRIURETIC PEPTIDE 5: 309.1 pg/mL — AB (ref 0.0–100.0)

## 2018-03-19 LAB — I-STAT TROPONIN, ED: Troponin i, poc: 0.02 ng/mL (ref 0.00–0.08)

## 2018-03-19 LAB — LACTIC ACID, PLASMA: LACTIC ACID, VENOUS: 1.8 mmol/L (ref 0.5–1.9)

## 2018-03-19 MED ORDER — IPRATROPIUM-ALBUTEROL 0.5-2.5 (3) MG/3ML IN SOLN
3.0000 mL | Freq: Four times a day (QID) | RESPIRATORY_TRACT | Status: DC
  Administered 2018-03-20 (×2): 3 mL via RESPIRATORY_TRACT
  Filled 2018-03-19 (×2): qty 3

## 2018-03-19 MED ORDER — SODIUM CHLORIDE 0.9 % IV SOLN
500.0000 mg | INTRAVENOUS | Status: DC
  Administered 2018-03-20 – 2018-03-21 (×2): 500 mg via INTRAVENOUS
  Filled 2018-03-19 (×3): qty 500

## 2018-03-19 MED ORDER — LOSARTAN POTASSIUM 50 MG PO TABS
50.0000 mg | ORAL_TABLET | Freq: Every day | ORAL | Status: DC
  Administered 2018-03-19 – 2018-03-23 (×5): 50 mg via ORAL
  Filled 2018-03-19 (×5): qty 1

## 2018-03-19 MED ORDER — ASCORBIC ACID 500 MG PO CAPS: 500.0000 mg | ORAL_CAPSULE | Freq: Every day | ORAL | Status: DC

## 2018-03-19 MED ORDER — SODIUM CHLORIDE 0.9 % IV SOLN
INTRAVENOUS | Status: DC
  Administered 2018-03-19 – 2018-03-22 (×3): via INTRAVENOUS

## 2018-03-19 MED ORDER — PIPERACILLIN-TAZOBACTAM 3.375 G IVPB
3.3750 g | Freq: Three times a day (TID) | INTRAVENOUS | Status: DC
  Administered 2018-03-19 – 2018-03-21 (×7): 3.375 g via INTRAVENOUS
  Filled 2018-03-19 (×8): qty 50

## 2018-03-19 MED ORDER — PANTOPRAZOLE SODIUM 40 MG PO TBEC
40.0000 mg | DELAYED_RELEASE_TABLET | Freq: Every day | ORAL | Status: DC
  Administered 2018-03-20 – 2018-03-23 (×4): 40 mg via ORAL
  Filled 2018-03-19 (×4): qty 1

## 2018-03-19 MED ORDER — LEVOFLOXACIN IN D5W 750 MG/150ML IV SOLN
750.0000 mg | Freq: Once | INTRAVENOUS | Status: AC
  Administered 2018-03-19: 750 mg via INTRAVENOUS
  Filled 2018-03-19: qty 150

## 2018-03-19 MED ORDER — ASPIRIN 81 MG PO TABS: 81.0000 mg | ORAL_TABLET | Freq: Every day | ORAL | Status: DC

## 2018-03-19 MED ORDER — ATORVASTATIN CALCIUM 40 MG PO TABS
40.0000 mg | ORAL_TABLET | Freq: Every day | ORAL | Status: DC
  Administered 2018-03-19 – 2018-03-23 (×5): 40 mg via ORAL
  Filled 2018-03-19 (×5): qty 1

## 2018-03-19 MED ORDER — SODIUM CHLORIDE 0.9 % IV BOLUS
250.0000 mL | Freq: Once | INTRAVENOUS | Status: AC
  Administered 2018-03-19: 250 mL via INTRAVENOUS

## 2018-03-19 MED ORDER — LEVOTHYROXINE SODIUM 50 MCG PO TABS
50.0000 ug | ORAL_TABLET | Freq: Every day | ORAL | Status: DC
  Administered 2018-03-20 – 2018-03-23 (×4): 50 ug via ORAL
  Filled 2018-03-19 (×4): qty 1

## 2018-03-19 MED ORDER — HEPARIN SODIUM (PORCINE) 5000 UNIT/ML IJ SOLN
5000.0000 [IU] | Freq: Three times a day (TID) | INTRAMUSCULAR | Status: DC
  Administered 2018-03-19 – 2018-03-23 (×11): 5000 [IU] via SUBCUTANEOUS
  Filled 2018-03-19 (×11): qty 1

## 2018-03-19 MED ORDER — OSELTAMIVIR PHOSPHATE 75 MG PO CAPS
75.0000 mg | ORAL_CAPSULE | Freq: Once | ORAL | Status: DC
  Filled 2018-03-19: qty 1

## 2018-03-19 MED ORDER — ADULT MULTIVITAMIN W/MINERALS CH
1.0000 | ORAL_TABLET | Freq: Every day | ORAL | Status: DC
  Administered 2018-03-19 – 2018-03-23 (×5): 1 via ORAL
  Filled 2018-03-19 (×5): qty 1

## 2018-03-19 MED ORDER — ALBUTEROL SULFATE (2.5 MG/3ML) 0.083% IN NEBU: 2.5000 mg | INHALATION_SOLUTION | RESPIRATORY_TRACT | Status: DC | PRN

## 2018-03-19 MED ORDER — ACETAMINOPHEN 325 MG PO TABS
650.0000 mg | ORAL_TABLET | Freq: Four times a day (QID) | ORAL | Status: DC | PRN
  Administered 2018-03-19 – 2018-03-20 (×2): 650 mg via ORAL
  Filled 2018-03-19 (×2): qty 2

## 2018-03-19 MED ORDER — TRAMADOL HCL 50 MG PO TABS: 50.0000 mg | ORAL_TABLET | Freq: Three times a day (TID) | ORAL | Status: DC | PRN

## 2018-03-19 MED ORDER — ENSURE ENLIVE PO LIQD
237.0000 mL | Freq: Two times a day (BID) | ORAL | Status: DC
  Administered 2018-03-19 – 2018-03-23 (×2): 237 mL via ORAL

## 2018-03-19 MED ORDER — VITAMIN D 1000 UNITS PO TABS
1000.0000 [IU] | ORAL_TABLET | Freq: Every day | ORAL | Status: DC
  Administered 2018-03-19 – 2018-03-23 (×5): 1000 [IU] via ORAL
  Filled 2018-03-19 (×5): qty 1

## 2018-03-19 MED ORDER — IPRATROPIUM-ALBUTEROL 0.5-2.5 (3) MG/3ML IN SOLN
3.0000 mL | Freq: Once | RESPIRATORY_TRACT | Status: AC
  Administered 2018-03-19: 3 mL via RESPIRATORY_TRACT
  Filled 2018-03-19: qty 3

## 2018-03-19 MED ORDER — CENTRUM SILVER PO TABS: ORAL_TABLET | Freq: Every day | ORAL | Status: DC

## 2018-03-19 MED ORDER — VITAMIN E 180 MG (400 UNIT) PO CAPS
400.0000 [IU] | ORAL_CAPSULE | Freq: Every day | ORAL | Status: DC
  Administered 2018-03-19 – 2018-03-23 (×4): 400 [IU] via ORAL
  Filled 2018-03-19 (×5): qty 1

## 2018-03-19 MED ORDER — TAMSULOSIN HCL 0.4 MG PO CAPS
0.4000 mg | ORAL_CAPSULE | Freq: Every day | ORAL | Status: DC
  Administered 2018-03-22 – 2018-03-23 (×2): 0.4 mg via ORAL
  Filled 2018-03-19 (×3): qty 1

## 2018-03-19 MED ORDER — GUAIFENESIN ER 600 MG PO TB12
1200.0000 mg | ORAL_TABLET | Freq: Two times a day (BID) | ORAL | Status: DC
  Administered 2018-03-19 – 2018-03-23 (×8): 1200 mg via ORAL
  Filled 2018-03-19 (×9): qty 2

## 2018-03-19 MED ORDER — ASPIRIN 81 MG PO CHEW
81.0000 mg | CHEWABLE_TABLET | Freq: Every day | ORAL | Status: DC
  Administered 2018-03-19 – 2018-03-23 (×5): 81 mg via ORAL
  Filled 2018-03-19 (×5): qty 1

## 2018-03-19 MED ORDER — VITAMIN C 500 MG PO TABS
500.0000 mg | ORAL_TABLET | Freq: Every day | ORAL | Status: DC
  Administered 2018-03-19 – 2018-03-23 (×5): 500 mg via ORAL
  Filled 2018-03-19 (×5): qty 1

## 2018-03-19 MED ORDER — LORATADINE 10 MG PO TABS
10.0000 mg | ORAL_TABLET | Freq: Every day | ORAL | Status: DC
  Administered 2018-03-19 – 2018-03-23 (×5): 10 mg via ORAL
  Filled 2018-03-19 (×5): qty 1

## 2018-03-19 NOTE — H&P (Addendum)
TRH H&P   Patient Demographics:    Eric Gordon, is a 84 y.o. male  MRN: [id number]   DOB - Aug 13, 1931  Admit Date - 03/19/2018  Outpatient Primary MD for the patient is Shon Baton, MD  Referring MD/NP/PA: Dr Suan Halter  Patient coming from: home  Chief Complaint  Patient presents with  . Shortness of Breath      HPI:    Eric Gordon  is a 84 y.o. male, with past medical history of CAD status post CABG, essential tremors, A. fib only noted post CABG, PE post CABG, not on any further anticoagulation, AAA, OSA on CPAP, carotid stenosis, asthma, tension, mild aortic stenosis, patient presents with home secondary to complaints of generalized weakness, shortness of breath and fever, patient with chronic lower back pain, not surgical candidate, had some acupuncture yesterday, but last injection was more than 90-month ago , he presents with 3 at home yesterday night, poor appetite, increased generalized weakness, and ED work-up significant for cytosis at 17 K, negative urine analysis, lactic acid within normal limits, x-ray significant for basilar pneumonia, patient reports dyspnea, but he denies any cough or productive sputum, no chest pain, no dysuria or polyuria, he denies any sick contacts, started on Levaquin in ED, and I was called to admit.    Review of systems:    In addition to the HPI above,  Reports fever and chills at home No Headache, No changes with Vision or hearing, No problems swallowing food or Liquids, No Chest pain, Cough, reports shortness of breath No Abdominal pain, No Nausea or Vommitting, Bowel movements are regular, No Blood in stool or Urine, No dysuria, No new skin rashes or bruises, Reports chronic lower back pain, at baseline No new weakness, tingling, numbness in any extremity, No recent weight gain or loss, No polyuria, polydypsia or  polyphagia, No significant Mental Stressors.  A full 10 point Review of Systems was done, except as stated above, all other Review of Systems were negative.   With Past History of the following :    Past Medical History:  Diagnosis Date  . Abdominal aortic aneurysm (AAA) ([place])   . Arthritis   . Asthma   . CAD (coronary artery disease)   . Chronic anticoagulation    on coumadin for PE  . GERD (gastroesophageal reflux disease)   . Hypertension   . Pulmonary embolism [facility])    April 2012 after CABG  . S/P CABG (coronary artery bypass graft)   . Sleep apnea       Past Surgical History:  Procedure Laterality Date  . CORONARY ARTERY BYPASS GRAFT  March 2012   x 5  . INGUINAL HERNIA REPAIR  1980  . knee surgery  2006   left  . PENILE PROSTHESIS IMPLANT    . RIGHT HEART CATHETERIZATION N/A 11/14/2013   Procedure: RIGHT HEART CATH;  Surgeon: Elby Showers  Aundra Dubin, MD;  Location: [facility] CATH LAB;  Service: Cardiovascular;  Laterality: N/A;  . ROTATOR CUFF REPAIR  2003   right      Social History:     Social History   Tobacco Use  . Smoking status: Former Smoker    Packs/day: 1.00    Years: 35.00    Pack years: 35.00    Types: Cigarettes    Last attempt to quit: 09/23/1983    Years since quitting: 34.5  . Smokeless tobacco: Never Used  Substance Use Topics  . Alcohol use: Yes    Alcohol/week: 1.8 oz    Types: 3 Standard drinks or equivalent per week     Lives -at home  Mobility -dependent    Family History :     Family History  Problem Relation Age of Onset  . Coronary artery disease Mother   . Hypertension Mother   . Heart disease Mother      Home Medications:   Prior to Admission medications   Medication Sig Start Date End Date Taking? Authorizing Provider  Ascorbic Acid 500 MG CAPS Take 500 mg by mouth daily.   Yes [provider]  aspirin 81 MG tablet Take 81 mg by mouth daily.     Yes [provider]  atorvastatin (LIPITOR) 40 MG tablet  Take 40 mg by mouth daily.   Yes [provider]  cetirizine (ZYRTEC) 10 MG tablet Take 10 mg by mouth daily.     Yes [provider]  cholecalciferol (VITAMIN D) 1000 UNITS tablet Take 1,000 Units by mouth daily.     Yes [provider]  levothyroxine (SYNTHROID, LEVOTHROID) 50 MCG tablet Take 50 mcg by mouth daily.   Yes [provider]  losartan (COZAAR) 50 MG tablet TAKE ONE TABLET BY MOUTH ONCE DAILY Patient taking differently: TAKE ONE TABLET 50mg   BY MOUTH ONCE DAILY 11/11/17  Yes Larey Dresser, MD  Multiple Vitamins-Minerals (CENTRUM SILVER PO) Take 1 tablet by mouth daily.    Yes [provider]  pantoprazole (PROTONIX) 40 MG tablet Take 40 mg by mouth daily.    Yes [provider]  tamsulosin (FLOMAX) 0.4 MG CAPS capsule Take 0.4 mg by mouth daily.   Yes [provider]  traMADol (ULTRAM) 50 MG tablet Take 50 mg by mouth 3 (three) times daily as needed for moderate pain.    Yes [provider]  vitamin E 400 UNIT capsule Take 400 Units by mouth daily.   Yes [provider]     Allergies:     Allergies  Allergen Reactions  . Ancef [Cefazolin Sodium] Hives  . Vancomycin Rash     Physical Exam:   Vitals  Blood pressure (!) 145/65, pulse 93, temperature 98.4 F (36.9 C), temperature source Oral, resp. rate (!) 33, height 6\' 3"  (1.905 m), weight 105.7 kg (233 lb), SpO2 90 %.   1. General frail, elderly elderly male, laying in bed in mild discomfort  2. Normal affect and insight, Not Suicidal or Homicidal, Awake Alert, Oriented X 3.  3. No F.N deficits, ALL C.Nerves Intact, Strength 5/5 all 4 extremities, Sensation intact all 4 extremities, Plantars down going.  4. Ears and Eyes appear Normal, Conjunctivae clear, PERRLA. Moist Oral Mucosa.  5. Supple Neck, No JVD, No cervical lymphadenopathy appriciated, No Carotid Bruits.  6. Symmetrical Chest wall movement, Good air movement bilaterally,  CTAB.  7. RRR, No Gallops, Rubs or Murmurs, No Parasternal Heave.  8. Positive Bowel  Sounds, Abdomen Soft, No tenderness, No organomegaly appriciated,No rebound -guarding or rigidity.  9.  No Cyanosis, Normal Skin Turgor, No Skin Rash or Bruise.  10. Good muscle tone,  joints appear normal , no effusions, Normal ROM.  Patient with lower back tenderness in the lumbar/sacral area, this is at baseline per him  11. No Palpable Lymph Nodes in Neck or Axillae    Data Review:    CBC Recent Labs  Lab 03/19/18 1334  WBC 17.3*  HGB 15.5  HCT 49.0  PLT 133*  MCV 96.6  MCH 30.6  MCHC 31.6  RDW 13.9  LYMPHSABS 0.7  MONOABS 1.6*  EOSABS 0.0  BASOSABS 0.0   ------------------------------------------------------------------------------------------------------------------  Chemistries  Recent Labs  Lab 03/19/18 1334  NA 135  K 3.8  CL 97*  CO2 27  GLUCOSE 116*  BUN 21  CREATININE 1.26*  CALCIUM 8.9  AST 22  ALT 14  ALKPHOS 65  BILITOT 1.4*   ------------------------------------------------------------------------------------------------------------------ estimated creatinine clearance is 57.4 mL/min (A) (by C-G formula based on SCr of 1.26 mg/dL (H)). ------------------------------------------------------------------------------------------------------------------ No results for input(s): TSH, T4TOTAL, T3FREE, THYROIDAB in the last 72 hours.  Invalid input(s): FREET3  Coagulation profile No results for input(s): INR, PROTIME in the last 168 hours. ------------------------------------------------------------------------------------------------------------------- No results for input(s): DDIMER in the last 72 hours. -------------------------------------------------------------------------------------------------------------------  Cardiac Enzymes No results for input(s): CKMB, TROPONINI, MYOGLOBIN in the last 168 hours.  Invalid input(s):  CK ------------------------------------------------------------------------------------------------------------------    Component Value Date/Time   BNP 309.1 (H) 03/19/2018 1335   BNP 81.4 06/24/2011 1657     ---------------------------------------------------------------------------------------------------------------  Urinalysis    Component Value Date/Time   COLORURINE AMBER (A) 03/19/2018 1456   APPEARANCEUR HAZY (A) 03/19/2018 1456   LABSPEC 1.027 03/19/2018 1456   PHURINE 5.0 03/19/2018 1456   GLUCOSEU NEGATIVE 03/19/2018 1456   HGBUR NEGATIVE 03/19/2018 1456   BILIRUBINUR NEGATIVE 03/19/2018 1456   KETONESUR 5 (A) 03/19/2018 1456   PROTEINUR 100 (A) 03/19/2018 1456   UROBILINOGEN 0.2 12/23/2010 2026   NITRITE NEGATIVE 03/19/2018 1456   LEUKOCYTESUR NEGATIVE 03/19/2018 1456    ----------------------------------------------------------------------------------------------------------------   Imaging Results:    Dg Chest 2 View  Result Date: 03/19/2018 CLINICAL DATA:  Acute shortness of breath and fever. EXAM: CHEST - 2 VIEW COMPARISON:  12/21/2015 and prior chest radiographs FINDINGS: Cardiomediastinal silhouette is unchanged. CABG changes again identified. Apparent increased opacity within the posterior lower lobe on the LATERAL view noted and may represent pneumonia. There is no evidence of pleural effusion, pneumothorax, pulmonary mass or acute bony abnormality. IMPRESSION: Apparent opacity within the posterior lower lobe on the LATERAL view-question pneumonia. Electronically Signed   By: Margarette Canada M.D.   On: 03/19/2018 13:34    My personal review of EKG: Rhythm NSR, 91 bpm, QTC of 495, with old right bundle branch block, left anterior fascicular block   Assessment & Plan:    Active Problems:   Asthma, mild intermittent   Abdominal aortic aneurysm (HCC)   Coronary artery disease   CAP (community acquired pneumonia)   Community-acquired pneumonia -Presents  with dyspnea, even though no cough yet, he does have evidence of pneumonia on chest x-ray, will start on azithromycin and Zosyn(given cephalosporin allergy, penicillin in the past), follow blood cultures, sputum cultures, will obtain Legionella and strep antigen antibody as well, will start on scheduled duo nebs, PRN albuterol, and scheduled Mucinex, flutter valve and incentive spirometry. -Intact fever 100.5 on my physical exam, he will be given Tylenol PRN as well -  Avoid fluoroquinolones given history of AAA  Asthma.  Mild, intermittent -No active wheezing, continue with scheduled duo nebs and as needed albuterol  History of CAD status post CABG -Continue with aspirin, statin and losartan, he denies any chest pain  Hypertension -Blood pressure acceptable, continue with home medication  Hypothyroidism -Continue with home medication  BPH  - continue with home medication   history of AAA -  recent ultrasound with size 4.1 cm, she is stable from previous, avoid fluoroquinolones  OSA -cont CPAP nightly  DVT Prophylaxis Heparin - SCDs  AM Labs Ordered, also please review Full Orders  Family Communication: Admission, patients condition and plan of care including tests being ordered have been discussed with the patient, son and wife who indicate understanding and agree with the plan and Code Status.  Code Status full  Likely DC to  Home  Condition GUARDED    Consults called: none   Admission status: inpatient  Time spent in minutes : 60 minutes   Phillips Climes M.D on 03/19/2018 at 3:16 PM  Between 7am to 7pm - Pager - [phone]. After 7pm go to [email or web address] - password [facility]  [facility] - Office  [phone]

## 2018-03-19 NOTE — ED Provider Notes (Signed)
Medical screening examination/treatment/procedure(s) were conducted as a shared visit with non-physician practitioner(s) and myself.  I personally evaluated the patient during the encounter.  None   Patient presents with a history of fever and chills and shortness of breath.  Last evening had a temp to 103.  Patient had a history of pneumonia in the past but not recently.  Patient denies any abdominal pain chest pain any upper respiratory symptoms.  Any dysuria or pain with urination.  No rash no recent tick bites no recent travel.  Based on the fever history initial concern was for possible infectious process.  Patient does not use oxygen at home but is requiring oxygen here.  Chest x-ray seems to be consistent with a left lower lobe pneumonia.  Labs are still pending.  Patient has a significant allergy to cephalosporins and to vancomycin so patient will be treated with Levaquin.  Lactic acid ordered blood cultures ordered antibiotics ordered.  Patient's lungs are clear patient is alert and oriented heart regular not febrile here abdomen soft nontender no rash.   Fredia Sorrow, MD 03/19/18 1343

## 2018-03-19 NOTE — ED Notes (Signed)
Patient transported to X-ray 

## 2018-03-19 NOTE — ED Triage Notes (Signed)
Pt brought in by [facility] from home for SOB since 0500 this am. Per EMS pt has had a URI x1 week, back also injured his back about a week ago and has been in his bed. Pt has hx of pneumonia, states this feels similar. Per pt he had a fever of 103F at home, temp 98.98F on arrival. Per EMS pt has shallow and rapid respirations, lung sounds clear. Pt A+Ox4 and in NAD on arrival.

## 2018-03-19 NOTE — ED Provider Notes (Signed)
[facility] EMERGENCY DEPARTMENT Provider Note   CSN: [id number] Arrival date & time: 03/19/18  1221     History   Chief Complaint Chief Complaint  Patient presents with  . Shortness of Breath    HPI Eric Gordon is a 84 y.o. male.  HPI   Eric Gordon is a 84 y.o. male, with a history of CAD, CABG, HTN, PE, presenting to the ED with shortness of breaht beginning last night.  Endorses a fever for the last 2 days with T-max 103 F.  Has had some nasal congestion, but very little cough. Patient's wife states patient presented in exactly this manner when he had pneumonia previously. Denies headache, neck pain/stiffness, chest pain, abdominal pain, N/V/D, peripheral edema, orthopnea, or any other complaints.   Past Medical History:  Diagnosis Date  . Abdominal aortic aneurysm (AAA) ([place])   . Arthritis   . Asthma   . CAD (coronary artery disease)   . Chronic anticoagulation    on coumadin for PE  . GERD (gastroesophageal reflux disease)   . Hypertension   . Pulmonary embolism [facility])    April 2012 after CABG  . S/P CABG (coronary artery bypass graft)   . Sleep apnea     Patient Active Problem List   Diagnosis Date Noted  . Hoarseness 12/15/2013  . Mild aortic stenosis 11/03/2012  . Atrial fibrillation ([place]) 04/02/2011  . Carotid stenosis, asymptomatic 04/02/2011  . Coronary artery disease 01/10/2011  . Abdominal aortic aneurysm ([place]) 11/06/2010  . CHEST TIGHTNESS-PRESSURE-OTHER 11/01/2010  . Asthma, mild intermittent 04/10/2010  . DYSPNEA 02/26/2010  . OBSTRUCTIVE SLEEP APNEA 08/22/2008  . Essential hypertension 08/22/2008  . BARRETTS ESOPHAGUS 08/18/2008  . HIATAL HERNIA 08/18/2008  . DIVERTICULOSIS, COLON 08/18/2008  . COLONIC POLYPS, HX OF 08/18/2008    Past Surgical History:  Procedure Laterality Date  . CORONARY ARTERY BYPASS GRAFT  March 2012   x 5  . INGUINAL HERNIA REPAIR  1980  . knee surgery  2006   left  . PENILE  PROSTHESIS IMPLANT    . RIGHT HEART CATHETERIZATION N/A 11/14/2013   Procedure: RIGHT HEART CATH;  Surgeon: Larey Dresser, MD;  Location: [facility] CATH LAB;  Service: Cardiovascular;  Laterality: N/A;  . ROTATOR CUFF REPAIR  2003   right        Home Medications    Prior to Admission medications   Medication Sig Start Date End Date Taking? Authorizing Provider  Ascorbic Acid 500 MG CAPS Take 500 mg by mouth daily.   Yes [provider]  aspirin 81 MG tablet Take 81 mg by mouth daily.     Yes [provider]  atorvastatin (LIPITOR) 40 MG tablet Take 40 mg by mouth daily.   Yes [provider]  cetirizine (ZYRTEC) 10 MG tablet Take 10 mg by mouth daily.     Yes [provider]  cholecalciferol (VITAMIN D) 1000 UNITS tablet Take 1,000 Units by mouth daily.     Yes [provider]  levothyroxine (SYNTHROID, LEVOTHROID) 50 MCG tablet Take 50 mcg by mouth daily.   Yes [provider]  losartan (COZAAR) 50 MG tablet TAKE ONE TABLET BY MOUTH ONCE DAILY Patient taking differently: TAKE ONE TABLET 50mg   BY MOUTH ONCE DAILY 11/11/17  Yes Larey Dresser, MD  Multiple Vitamins-Minerals (CENTRUM SILVER PO) Take 1 tablet by mouth daily.    Yes [provider]  pantoprazole (PROTONIX) 40 MG tablet Take 40 mg by mouth  daily.    Yes [provider]  tamsulosin (FLOMAX) 0.4 MG CAPS capsule Take 0.4 mg by mouth daily.   Yes [provider]  traMADol (ULTRAM) 50 MG tablet Take 50 mg by mouth 3 (three) times daily as needed for moderate pain.    Yes [provider]  vitamin E 400 UNIT capsule Take 400 Units by mouth daily.   Yes [provider]    Family History Family History  Problem Relation Age of Onset  . Coronary artery disease Mother   . Hypertension Mother   . Heart disease Mother     Social History Social History   Tobacco Use  . Smoking status: Former Smoker    Packs/day: 1.00    Years: 35.00      Pack years: 35.00    Types: Cigarettes    Last attempt to quit: 09/23/1983    Years since quitting: 34.5  . Smokeless tobacco: Never Used  Substance Use Topics  . Alcohol use: Yes    Alcohol/week: 1.8 oz    Types: 3 Standard drinks or equivalent per week  . Drug use: No     Allergies   Ancef [cefazolin sodium] and Vancomycin   Review of Systems Review of Systems  Constitutional: Positive for fever.  Respiratory: Positive for shortness of breath. Negative for cough.   Cardiovascular: Negative for chest pain and leg swelling.  Gastrointestinal: Negative for abdominal pain, diarrhea, nausea and vomiting.  Neurological: Negative for dizziness, weakness, light-headedness and numbness.  All other systems reviewed and are negative.    Physical Exam Updated Vital Signs BP (!) 166/76   Pulse 97   Temp 98.4 F (36.9 C) (Oral)   Resp (!) 34   Ht 6\' 3"  (1.905 m)   Wt 105.7 kg (233 lb)   SpO2 94%   BMI 29.12 kg/m   Physical Exam  Constitutional: He appears well-developed and well-nourished. No distress.  HENT:  Head: Normocephalic and atraumatic.  Eyes: Conjunctivae are normal.  Neck: Neck supple.  Cardiovascular: Normal rate, regular rhythm, normal heart sounds and intact distal pulses.  Pulmonary/Chest: Breath sounds normal. Tachypnea noted.  Increased work of breathing.  Requires 4 L supplemental O2 to maintain comfort. Lowest observed SPO2 was 94%, however, patient's tachypnea and work of breathing noticeably increases with removal of supplemental O2.  Abdominal: Soft. There is no tenderness. There is no guarding.  Musculoskeletal: He exhibits no edema.  Lymphadenopathy:    He has no cervical adenopathy.  Neurological: He is alert.  Skin: Skin is warm and dry. He is not diaphoretic.  Psychiatric: He has a normal mood and affect. His behavior is normal.  Nursing note and vitals reviewed.    ED Treatments / Results  Labs (all labs ordered are listed, but only  abnormal results are displayed) Labs Reviewed  COMPREHENSIVE METABOLIC PANEL - Abnormal; Notable for the following components:      Result Value   Chloride 97 (*)    Glucose, Bld 116 (*)    Creatinine, Ser 1.26 (*)    Total Bilirubin 1.4 (*)    GFR calc non Af Amer 51 (*)    GFR calc Af Amer 59 (*)    All other components within normal limits  CBC WITH DIFFERENTIAL/PLATELET - Abnormal; Notable for the following components:   WBC 17.3 (*)    Platelets 133 (*)    Neutro Abs 14.9 (*)    Monocytes Absolute 1.6 (*)    All other components within  normal limits  URINALYSIS, ROUTINE W REFLEX MICROSCOPIC - Abnormal; Notable for the following components:   Color, Urine AMBER (*)    APPearance HAZY (*)    Ketones, ur 5 (*)    Protein, ur 100 (*)    All other components within normal limits  BRAIN NATRIURETIC PEPTIDE - Abnormal; Notable for the following components:   B Natriuretic Peptide 309.1 (*)    All other components within normal limits  CULTURE, BLOOD (ROUTINE X 2)  CULTURE, BLOOD (ROUTINE X 2)  LACTIC ACID, PLASMA  I-STAT TROPONIN, ED    EKG EKG Interpretation  Date/Time:  Friday March 19 2018 13:35:21 EDT Ventricular Rate:  91 PR Interval:    QRS Duration: 160 QT Interval:  402 QTC Calculation: 495 R Axis:   -57 Text Interpretation:  Sinus rhythm RBBB and LAFB No significant change since last tracing Confirmed by Fredia Sorrow [phone]) on 03/19/2018 3:01:49 PM   Radiology Dg Chest 2 View  Result Date: 03/19/2018 CLINICAL DATA:  Acute shortness of breath and fever. EXAM: CHEST - 2 VIEW COMPARISON:  12/21/2015 and prior chest radiographs FINDINGS: Cardiomediastinal silhouette is unchanged. CABG changes again identified. Apparent increased opacity within the posterior lower lobe on the LATERAL view noted and may represent pneumonia. There is no evidence of pleural effusion, pneumothorax, pulmonary mass or acute bony abnormality. IMPRESSION: Apparent opacity within the  posterior lower lobe on the LATERAL view-question pneumonia. Electronically Signed   By: Margarette Canada M.D.   On: 03/19/2018 13:34    Procedures Procedures (including critical care time)  Medications Ordered in ED Medications  levofloxacin (LEVAQUIN) IVPB 750 mg (750 mg Intravenous New Bag/Given 03/19/18 1436)  ipratropium-albuterol (DUONEB) 0.5-2.5 (3) MG/3ML nebulizer solution 3 mL (has no administration in time range)  sodium chloride 0.9 % bolus 250 mL (has no administration in time range)     Initial Impression / Assessment and Plan / ED Course  I have reviewed the triage vital signs and the nursing notes.  Pertinent labs & imaging results that were available during my care of the patient were reviewed by me and considered in my medical decision making (see chart for details).  Clinical Course as of Mar 20 1511  Fri Mar 19, 2018  1507 Spoke with Dr. Herbert Moors, hospitalist. Agrees to admit the patient.   [SJ]    Clinical Course User Index [SJ] Joy, Shawn C, PA-C    Patient presents with shortness of breath beginning last night accompanied by fever.  Tachypnea and increased work of breathing requiring supplemental O2.  Probable pneumonia on chest x-ray.  Admission for IV antibiotics and new oxygen requirement.  Findings and plan of care discussed with Fredia Sorrow, MD. Dr. Rogene Houston personally evaluated and examined this patient.   Vitals:   03/19/18 1222 03/19/18 1229 03/19/18 1245 03/19/18 1445  BP:  (!) 166/76 (!) 146/72 (!) 145/65  Pulse:  97 92 93  Resp:  (!) 34 18 (!) 33  Temp:  98.4 F (36.9 C)    TempSrc:  Oral    SpO2: 94% 94% 100% 90%  Weight:  105.7 kg (233 lb)    Height:  6\' 3"  (1.905 m)       Final Clinical Impressions(s) / ED Diagnoses   Final diagnoses:  Community acquired pneumonia, unspecified laterality  SOB (shortness of breath)    ED Discharge Orders    None       Layla Maw 03/19/18 1513    Fredia Sorrow, MD 03/22/18  1845  

## 2018-03-20 DIAGNOSIS — I2510 Atherosclerotic heart disease of native coronary artery without angina pectoris: Secondary | ICD-10-CM

## 2018-03-20 DIAGNOSIS — G4733 Obstructive sleep apnea (adult) (pediatric): Secondary | ICD-10-CM

## 2018-03-20 DIAGNOSIS — J189 Pneumonia, unspecified organism: Principal | ICD-10-CM

## 2018-03-20 DIAGNOSIS — J4520 Mild intermittent asthma, uncomplicated: Secondary | ICD-10-CM

## 2018-03-20 LAB — CBC
HEMATOCRIT: 44.3 % (ref 39.0–52.0)
HEMOGLOBIN: 14.1 g/dL (ref 13.0–17.0)
MCH: 30.5 pg (ref 26.0–34.0)
MCHC: 31.8 g/dL (ref 30.0–36.0)
MCV: 95.9 fL (ref 78.0–100.0)
Platelets: 127 10*3/uL — ABNORMAL LOW (ref 150–400)
RBC: 4.62 MIL/uL (ref 4.22–5.81)
RDW: 14 % (ref 11.5–15.5)
WBC: 16.7 10*3/uL — AB (ref 4.0–10.5)

## 2018-03-20 LAB — BASIC METABOLIC PANEL
ANION GAP: 7 (ref 5–15)
BUN: 21 mg/dL (ref 8–23)
CALCIUM: 8.7 mg/dL — AB (ref 8.9–10.3)
CO2: 26 mmol/L (ref 22–32)
Chloride: 102 mmol/L (ref 98–111)
Creatinine, Ser: 1.2 mg/dL (ref 0.61–1.24)
GFR calc Af Amer: >60 mL/min (ref >60)
GFR, EST NON AFRICAN AMERICAN: 54 mL/min — AB (ref >60)
Glucose, Bld: 109 mg/dL — ABNORMAL HIGH (ref 70–99)
POTASSIUM: 3.5 mmol/L (ref 3.5–5.1)
SODIUM: 135 mmol/L (ref 135–145)

## 2018-03-20 LAB — STREP PNEUMONIAE URINARY ANTIGEN: STREP PNEUMO URINARY ANTIGEN: NEGATIVE

## 2018-03-20 MED ORDER — IPRATROPIUM-ALBUTEROL 0.5-2.5 (3) MG/3ML IN SOLN
3.0000 mL | Freq: Three times a day (TID) | RESPIRATORY_TRACT | Status: DC
  Administered 2018-03-20 – 2018-03-21 (×4): 3 mL via RESPIRATORY_TRACT
  Filled 2018-03-20 (×5): qty 3

## 2018-03-20 NOTE — Progress Notes (Signed)
[facility] PROGRESS NOTE  THAD OSORIA GDJ:[id number] DOB: 09-13-1931 DOA: 03/19/2018  PCP: Shon Baton, MD  Brief History/Interval Summary: 84 year old Caucasian male with a past medical history of coronary artery disease status post CABG, essential tremors, postoperative atrial fibrillation, previous history of PE but no longer on anticoagulation, presented with shortness of breath and fever.  He was found to have pneumonia and evaluation and was admitted to the hospital.  Reason for Visit: Community-acquired pneumonia  Consultants: None  Procedures: None  Antibiotics: Noted to be on Zosyn and azithromycin  Subjective/Interval History: Patient states that he continues to be short of breath.  Feels slightly better compared to yesterday.  Denies any cough.  No chest pain.  No nausea or vomiting.  ROS: Denies any headaches  Objective:  Vital Signs  Vitals:   03/20/18 0300 03/20/18 0308 03/20/18 0729 03/20/18 1000  BP:    132/65  Pulse:  86  76  Resp:  20    Temp: 98.7 F (37.1 C)   99.3 F (37.4 C)  TempSrc: Oral   Oral  SpO2:  95% 96% 97%  Weight:      Height:        Intake/Output Summary (Last 24 hours) at 03/20/2018 1042 Last data filed at 03/20/2018 0917 Gross per 24 hour  Intake 2036.15 ml  Output 600 ml  Net 1436.15 ml   Filed Weights   03/19/18 1229 03/19/18 1551  Weight: 105.7 kg (233 lb) 99.7 kg (219 lb 12.8 oz)    General appearance: alert, cooperative, appears stated age and no distress Resp: Mildly tachypneic at rest.  No use of accessory muscles.  Does have crackles bilateral bases.  Right more than left.  No rhonchi.  No wheezing. Cardio: regular rate and rhythm, S1, S2 normal, no murmur, click, rub or gallop GI: soft, non-tender; bowel sounds normal; no masses,  no organomegaly Extremities: extremities normal, atraumatic, no cyanosis or edema Pulses: 2+ and symmetric Neurologic: No focal neurological deficits.  Lab  Results:  Data Reviewed: I have personally reviewed following labs and imaging studies  CBC: Recent Labs  Lab 03/19/18 1334 03/20/18 0227  WBC 17.3* 16.7*  NEUTROABS 14.9*  --   HGB 15.5 14.1  HCT 49.0 44.3  MCV 96.6 95.9  PLT 133* 127*    Basic Metabolic Panel: Recent Labs  Lab 03/19/18 1334 03/20/18 0227  NA 135 135  K 3.8 3.5  CL 97* 102  CO2 27 26  GLUCOSE 116* 109*  BUN 21 21  CREATININE 1.26* 1.20  CALCIUM 8.9 8.7*    GFR: Estimated Creatinine Clearance: 54.8 mL/min (by C-G formula based on SCr of 1.2 mg/dL).  Liver Function Tests: Recent Labs  Lab 03/19/18 1334  AST 22  ALT 14  ALKPHOS 65  BILITOT 1.4*  PROT 7.0  ALBUMIN 3.5     Recent Results (from the past 240 hour(s))  Culture, blood (Routine X 2) w Reflex to ID Panel     Status: None (Preliminary result)   Collection Time: 03/19/18  2:34 PM  Result Value Ref Range Status   Specimen Description BLOOD RIGHT ANTECUBITAL  Final   Special Requests   Final    BOTTLES DRAWN AEROBIC AND ANAEROBIC Blood Culture adequate volume   Culture   Final    NO GROWTH < 24 HOURS Performed at [facility], [place] [address], [place], [place] [address]    Report Status PENDING  Incomplete  Culture, blood (Routine X 2) w  Reflex to ID Panel     Status: None (Preliminary result)   Collection Time: 03/19/18  2:34 PM  Result Value Ref Range Status   Specimen Description BLOOD LEFT ANTECUBITAL  Final   Special Requests   Final    BOTTLES DRAWN AEROBIC AND ANAEROBIC Blood Culture results may not be optimal due to an excessive volume of blood received in culture bottles   Culture   Final    NO GROWTH < 24 HOURS Performed at [facility] [address], [place], [place] [address]    Report Status PENDING  Incomplete      Radiology Studies: Dg Chest 2 View  Result Date: 03/19/2018 CLINICAL DATA:  Acute shortness of breath and fever. EXAM: CHEST - 2 VIEW COMPARISON:  12/21/2015 and prior chest  radiographs FINDINGS: Cardiomediastinal silhouette is unchanged. CABG changes again identified. Apparent increased opacity within the posterior lower lobe on the LATERAL view noted and may represent pneumonia. There is no evidence of pleural effusion, pneumothorax, pulmonary mass or acute bony abnormality. IMPRESSION: Apparent opacity within the posterior lower lobe on the LATERAL view-question pneumonia. Electronically Signed   By: Margarette Canada M.D.   On: 03/19/2018 13:34     Medications:  Scheduled: . aspirin  81 mg Oral Daily  . atorvastatin  40 mg Oral Daily  . cholecalciferol  1,000 Units Oral Daily  . feeding supplement (ENSURE ENLIVE)  237 mL Oral BID BM  . guaiFENesin  1,200 mg Oral BID  . heparin  5,000 Units Subcutaneous Q8H  . ipratropium-albuterol  3 mL Nebulization Q6H  . levothyroxine  50 mcg Oral QAC breakfast  . loratadine  10 mg Oral Daily  . losartan  50 mg Oral Daily  . multivitamin with minerals  1 tablet Oral Daily  . pantoprazole  40 mg Oral Daily  . tamsulosin  0.4 mg Oral Daily  . vitamin C  500 mg Oral Daily  . vitamin E  400 Units Oral Daily   Continuous: . sodium chloride 75 mL/hr at 03/19/18 1638  . azithromycin    . piperacillin-tazobactam (ZOSYN)  IV 3.375 g (03/20/18 1036)   HLK:[id number], albuterol, traMADol  Assessment/Plan:    Community-acquired pneumonia Patient presented with shortness of breath and fever.  Chest x-ray did suggest pneumonia.  Patient was started on antibiotics including Zosyn and azithromycin.  Follow-up on cultures.  Feels slightly better this morning.  Did have fever last night.  Unclear if he was hypoxic.  He is on oxygen currently.  Influenza PCR negative.  Strep pneumo antigen negative.  History of mild intermittent asthma Does not appear to be an active issue currently.  Continue to monitor.  History of coronary artery disease status post CABG Stable.  Continue home medications.  Essential hypertension Continue  with home medications.  Blood pressure stable.  Hypothyroidism Continue with home medications.  History of AAA Recent study showed stability.  Fluoroquinolones to be avoided.  Obstructive sleep apnea Continue CPAP.  BPH Stable  DVT Prophylaxis: Subcutaneous heparin    Code Status: Full code Family Communication: Discussed with the patient.  No family at bedside. Disposition Plan: Management as outlined above.  Mobilize as tolerated.  Anticipate that he will be able to go back home and improved.    LOS: 1 day   [facility] Pager [phone] 03/20/2018, 10:42 AM  If 7PM-7AM, please contact night-coverage at [email or web address], password [facility]

## 2018-03-20 NOTE — Plan of Care (Signed)
Pt verbalizes understanding of fall prevention measures including calling for help before getting out of bed.  Pt's activity tolerance poor this shift - see progress note. Skin intact.

## 2018-03-20 NOTE — Progress Notes (Signed)
Patient already wearing CPAP with 3L O2 bled into circuit when RT entered room. Patient is tolerating CPAP well at this time. RT will monitor as needed.

## 2018-03-20 NOTE — Progress Notes (Signed)
Initial Nutrition Assessment  DOCUMENTATION CODES:   Not applicable  INTERVENTION:  Continue Ensure Enlive po BID, each supplement provides 350 kcal and 20 grams of protein.  Encourage adequate PO intake.   NUTRITION DIAGNOSIS:   Increased nutrient needs related to chronic illness(COPD) as evidenced by estimated needs.  GOAL:   Patient will meet greater than or equal to 90% of their needs  MONITOR:   PO intake, Supplement acceptance, Labs, Weight trends, I & O's, Skin  REASON FOR ASSESSMENT:   Malnutrition Screening Tool    ASSESSMENT:   84 year old male with a past medical history of coronary artery disease status post CABG, essential tremors, postoperative atrial fibrillation, previous history of PE but no longer on anticoagulation, presented with shortness of breath and fever.  He was found to have pneumonia  Pt was unavailable, busy working with therapy during time of visit. RD unable to obtain most recent nutrition history. Meal completion 90% at breakfast this AM. Pt currently has Ensure ordered. RD to continue with current orders to aid in adequate caloric and protein needs.   Unable to complete Nutrition-Focused physical exam at this time.   Labs and medications reviewed.   Diet Order:   Diet Order           Diet Heart Room service appropriate? Yes; Fluid consistency: Thin  Diet effective now          EDUCATION NEEDS:   Not appropriate for education at this time  Skin:  Skin Assessment: Reviewed RN Assessment  Last BM:  6/29  Height:   Ht Readings from Last 1 Encounters:  03/19/18 6\' 3"  (1.905 m)    Weight:   Wt Readings from Last 1 Encounters:  03/19/18 219 lb 12.8 oz (99.7 kg)    Ideal Body Weight:  89 kg  BMI:  Body mass index is 27.47 kg/m.  Estimated Nutritional Needs:   Kcal:  2050-2300  Protein:  95-110 grams  Fluid:  2- 2.3 L/day    Corrin Parker, MS, RD, LDN Pager # [phone] After hours/ weekend pager # [phone]

## 2018-03-20 NOTE — Progress Notes (Signed)
Pt with poor activity tolerance today.  Becomes SOB with attempts to complete ADL's.  Recovers quickly with rest, and breathing guidance - reminders to take slow, deep breaths.  Had an episode of chills, rigors after lunch while sitting in chair.  VSS and temp stable with this episode.    Is resting well this evening.  Reports 2/10 chest pain, center, describes it as an ache, "probably from this pneumonia."  Also states "it's not bad, I typically forget about it."   Family visited during the day, has gone home for the evening.    Clarified orders for level of care and active cardiac monitoring with provider who gave verbal order to d/c active cardiac monitoring orders, pt ok to remain as med-surg level of care.

## 2018-03-20 NOTE — Progress Notes (Signed)
Pt SOB with increased WOB on NC2LPM. Pt asked to be put on CPAP for the night. Will monitor and reassess.

## 2018-03-20 NOTE — Evaluation (Signed)
Physical Therapy Evaluation Patient Details Name: Eric Gordon MRN: [id number] DOB: Nov 26, 1930 Today's Date: 03/20/2018   History of Present Illness  Pt is a 84 y.o. male admitted 03/19/18 with generalized weakness, SOB and fever; CXR shows basilar pneumonia. PMH includes chronic LBP, CAD, essential tremors, AAA, OSA on CPAP, asthma.    Clinical Impression  Pt presents with an overall decrease in functional mobility secondary to above. PTA, pt indep and remains very active; lives with wife available for 24/7 support. Immediately PTA, using RW secondary to onset of fatigue/weakness. Today, pt with fatigue and DOE 3/4 ambulating in room with supervision. SpO2 90-91% on 2L O2 [place]. Educ on pursed lip breathing and energy conservation techniques. Main limitation is decreased activity tolerance and SOB; recommend use of RW for added stability and energy conservation. Pt would benefit from continued acute PT services to maximize functional mobility and independence prior to d/c home.    Follow Up Recommendations No PT follow up;Supervision for mobility/OOB    Equipment Recommendations  None recommended by PT    Recommendations for Other Services       Precautions / Restrictions Precautions Precautions: Fall Restrictions Weight Bearing Restrictions: No      Mobility  Bed Mobility Overal bed mobility: Independent                Transfers Overall transfer level: Needs assistance Equipment used: None Transfers: Sit to/from Stand Sit to Stand: Supervision            Ambulation/Gait Ambulation/Gait assistance: Supervision   Assistive device: None Gait Pattern/deviations: Step-through pattern Gait velocity: Decreased Gait velocity interpretation: 1.31 - 2.62 ft/sec, indicative of limited community ambulator General Gait Details: Pt took steps from bed to recliner with no DME and supervision for safety; DOE 3/4 with this minimal activity. (reports had just finished  ambulating to/from bathroom and standing at sink with RW)  Stairs            Wheelchair Mobility    Modified Rankin (Stroke Patients Only)       Balance Overall balance assessment: Needs assistance   Sitting balance-Leahy Scale: Normal       Standing balance-Leahy Scale: Fair                               Pertinent Vitals/Pain Pain Assessment: Faces Faces Pain Scale: Hurts a little bit Pain Location: lower back Pain Descriptors / Indicators: Constant;Sore Pain Intervention(s): Monitored during session;Repositioned    Home Living Family/patient expects to be discharged to:: Private residence Living Arrangements: Spouse/significant other Available Help at Discharge: Family;Available 24 hours/day Type of Home: House Home Access: Stairs to enter Entrance Stairs-Rails: None Entrance Stairs-Number of Steps: 4 Home Layout: Two level;Able to live on main level with bedroom/bathroom Home Equipment: Gilford Rile - 2 wheels;Cane - single point      Prior Function Level of Independence: Independent         Comments: Typically very active (drives, yard work, Research scientist (medical) at [employer]) and independent; intermittent debility due to LBP when overworked (has worked with OP PT for this; sees Systems developer). Was using SPC/RW immediately PTA due to fatigue/weakness     Hand Dominance        Extremity/Trunk Assessment   Upper Extremity Assessment Upper Extremity Assessment: Overall WFL for tasks assessed    Lower Extremity Assessment Lower Extremity Assessment: Overall WFL for tasks assessed    Cervical / Trunk Assessment Cervical / Trunk Assessment:  Normal  Communication   Communication: No difficulties  Cognition Arousal/Alertness: Awake/alert Behavior During Therapy: WFL for tasks assessed/performed Overall Cognitive Status: Within Functional Limits for tasks assessed                                        General Comments General comments  (skin integrity, edema, etc.): Wife present throughout session    Exercises     Assessment/Plan    PT Assessment Patient needs continued PT services  PT Problem List Decreased activity tolerance;Decreased balance;Decreased mobility;Cardiopulmonary status limiting activity       PT Treatment Interventions DME instruction;Gait training;Stair training;Functional mobility training;Therapeutic activities;Therapeutic exercise;Balance training;Patient/family education    PT Goals (Current goals can be found in the Care Plan section)  Acute Rehab PT Goals Patient Stated Goal: Return home breathing better PT Goal Formulation: With patient Time For Goal Achievement: 04/03/18 Potential to Achieve Goals: Good    Frequency Min 3X/week   Barriers to discharge        Co-evaluation               AM-PAC PT "6 Clicks" Daily Activity  Outcome Measure Difficulty turning over in bed (including adjusting bedclothes, sheets and blankets)?: None Difficulty moving from lying on back to sitting on the side of the bed? : None Difficulty sitting down on and standing up from a chair with arms (e.g., wheelchair, bedside commode, etc,.)?: None Help needed moving to and from a bed to chair (including a wheelchair)?: A Little Help needed walking in hospital room?: A Little Help needed climbing 3-5 steps with a railing? : A Little 6 Click Score: 21    End of Session Equipment Utilized During Treatment: Oxygen Activity Tolerance: Patient tolerated treatment well;Patient limited by fatigue Patient left: in chair;with call bell/phone within reach;with family/visitor present Nurse Communication: Mobility status PT Visit Diagnosis: Other abnormalities of gait and mobility (R26.89)    Time: 7262-0355 PT Time Calculation (min) (ACUTE ONLY): 18 min   Charges:   PT Evaluation $PT Eval Moderate Complexity: 1 Mod     PT G Codes:       Mabeline Caras, PT, DPT Acute Rehab Services  Pager:  [place] 03/20/2018, 12:13 PM

## 2018-03-21 DIAGNOSIS — J9601 Acute respiratory failure with hypoxia: Secondary | ICD-10-CM

## 2018-03-21 LAB — BASIC METABOLIC PANEL
Anion gap: 6 (ref 5–15)
BUN: 19 mg/dL (ref 8–23)
CALCIUM: 8.5 mg/dL — AB (ref 8.9–10.3)
CO2: 27 mmol/L (ref 22–32)
CREATININE: 1.29 mg/dL — AB (ref 0.61–1.24)
Chloride: 102 mmol/L (ref 98–111)
GFR calc Af Amer: 57 mL/min — ABNORMAL LOW (ref >60)
GFR, EST NON AFRICAN AMERICAN: 49 mL/min — AB (ref >60)
GLUCOSE: 93 mg/dL (ref 70–99)
Potassium: 4.2 mmol/L (ref 3.5–5.1)
Sodium: 135 mmol/L (ref 135–145)

## 2018-03-21 LAB — CBC
HCT: 42.2 % (ref 39.0–52.0)
Hemoglobin: 13.4 g/dL (ref 13.0–17.0)
MCH: 30.4 pg (ref 26.0–34.0)
MCHC: 31.8 g/dL (ref 30.0–36.0)
MCV: 95.7 fL (ref 78.0–100.0)
PLATELETS: 154 10*3/uL (ref 150–400)
RBC: 4.41 MIL/uL (ref 4.22–5.81)
RDW: 14.2 % (ref 11.5–15.5)
WBC: 8 10*3/uL (ref 4.0–10.5)

## 2018-03-21 MED ORDER — ACETAMINOPHEN 325 MG PO TABS
650.0000 mg | ORAL_TABLET | Freq: Four times a day (QID) | ORAL | Status: DC | PRN
  Filled 2018-03-21: qty 2

## 2018-03-21 MED ORDER — PIPERACILLIN-TAZOBACTAM 3.375 G IVPB
3.3750 g | Freq: Three times a day (TID) | INTRAVENOUS | Status: DC
  Administered 2018-03-22: 3.375 g via INTRAVENOUS
  Filled 2018-03-21 (×2): qty 50

## 2018-03-21 NOTE — Progress Notes (Signed)
SATURATION QUALIFICATIONS: (This note is used to comply with regulatory documentation for home oxygen)  Patient Saturations on Room Air at Rest = 92%  Patient Saturations on Room Air while Ambulating = 93%  Patient Saturations on 0 Liters of oxygen while Ambulating = 93%  Please briefly explain why patient needs home oxygen: Patient had episode of shortness of breath but did not desaturate.

## 2018-03-21 NOTE — Plan of Care (Signed)
Ambulated pt to bathroom with walker, pt tolerated well

## 2018-03-21 NOTE — Progress Notes (Signed)
Pt placess elf on/off our cpap. RT will monitor.

## 2018-03-21 NOTE — Progress Notes (Addendum)
[facility] PROGRESS NOTE  Eric Gordon PJK:[id number] DOB: 1931/08/12 DOA: 03/19/2018  PCP: Shon Baton, MD  Brief History/Interval Summary: 84 year old Caucasian male with a past medical history of coronary artery disease status post CABG, essential tremors, postoperative atrial fibrillation, previous history of PE but no longer on anticoagulation, presented with shortness of breath and fever.  He was found to have pneumonia and evaluation and was admitted to the hospital.  Reason for Visit: Community-acquired pneumonia  Consultants: None  Procedures: None  Antibiotics: Zosyn and azithromycin  Subjective/Interval History: Patient states that he is feeling much better he is to have some shortness of breath with exertion.  Denies any chest pain.  Some cough.  No nausea or vomiting.    ROS: Denies any headaches  Objective:  Vital Signs  Vitals:   03/20/18 1926 03/21/18 0004 03/21/18 0811 03/21/18 0851  BP:  (!) 144/75  (!) 127/55  Pulse: 79 81  83  Resp: 18 18  20   Temp:  99.1 F (37.3 C)  98.6 F (37 C)  TempSrc:  Oral  Oral  SpO2: 98% 92% 95% 95%  Weight:      Height:        Intake/Output Summary (Last 24 hours) at 03/21/2018 1000 Last data filed at 03/21/2018 0400 Gross per 24 hour  Intake 1290.08 ml  Output 275 ml  Net 1015.08 ml   Filed Weights   03/19/18 1229 03/19/18 1551  Weight: 105.7 kg (233 lb) 99.7 kg (219 lb 12.8 oz)    General appearance:.  Awake alert.  No distress. Resp: Effort has improved.  Does not appear to be tachypneic.  Continues to have crackles at the bases.  Improved air entry.  No wheezing or rhonchi. Cardio: S1-S2 is normal regular.  No S3-S4.  No rubs murmurs or bruit GI: Abdomen soft.  Nontender nondistended.  Bowel sounds are present.  No masses organomegaly Extremities: No edema Neurologic: No focal neurological deficits.  Lab Results:  Data Reviewed: I have personally reviewed following labs and imaging  studies  CBC: Recent Labs  Lab 03/19/18 1334 03/20/18 0227 03/21/18 0243  WBC 17.3* 16.7* 8.0  NEUTROABS 14.9*  --   --   HGB 15.5 14.1 13.4  HCT 49.0 44.3 42.2  MCV 96.6 95.9 95.7  PLT 133* 127* 809    Basic Metabolic Panel: Recent Labs  Lab 03/19/18 1334 03/20/18 0227 03/21/18 0243  NA 135 135 135  K 3.8 3.5 4.2  CL 97* 102 102  CO2 27 26 27   GLUCOSE 116* 109* 93  BUN 21 21 19   CREATININE 1.26* 1.20 1.29*  CALCIUM 8.9 8.7* 8.5*    GFR: Estimated Creatinine Clearance: 50.9 mL/min (A) (by C-G formula based on SCr of 1.29 mg/dL (H)).  Liver Function Tests: Recent Labs  Lab 03/19/18 1334  AST 22  ALT 14  ALKPHOS 65  BILITOT 1.4*  PROT 7.0  ALBUMIN 3.5     Recent Results (from the past 240 hour(s))  Culture, blood (Routine X 2) w Reflex to ID Panel     Status: None (Preliminary result)   Collection Time: 03/19/18  2:34 PM  Result Value Ref Range Status   Specimen Description BLOOD RIGHT ANTECUBITAL  Final   Special Requests   Final    BOTTLES DRAWN AEROBIC AND ANAEROBIC Blood Culture adequate volume   Culture   Final    NO GROWTH < 24 HOURS Performed at [facility], [place] [address], [place], [place]  [address]    Report Status PENDING  Incomplete  Culture, blood (Routine X 2) w Reflex to ID Panel     Status: None (Preliminary result)   Collection Time: 03/19/18  2:34 PM  Result Value Ref Range Status   Specimen Description BLOOD LEFT ANTECUBITAL  Final   Special Requests   Final    BOTTLES DRAWN AEROBIC AND ANAEROBIC Blood Culture results may not be optimal due to an excessive volume of blood received in culture bottles   Culture   Final    NO GROWTH < 24 HOURS Performed at [facility], [place] [address], [place], [place] [address]    Report Status PENDING  Incomplete      Radiology Studies: Dg Chest 2 View  Result Date: 03/19/2018 CLINICAL DATA:  Acute shortness of breath and fever. EXAM: CHEST - 2 VIEW COMPARISON:  12/21/2015 and  prior chest radiographs FINDINGS: Cardiomediastinal silhouette is unchanged. CABG changes again identified. Apparent increased opacity within the posterior lower lobe on the LATERAL view noted and may represent pneumonia. There is no evidence of pleural effusion, pneumothorax, pulmonary mass or acute bony abnormality. IMPRESSION: Apparent opacity within the posterior lower lobe on the LATERAL view-question pneumonia. Electronically Signed   By: Margarette Canada M.D.   On: 03/19/2018 13:34     Medications:  Scheduled: . aspirin  81 mg Oral Daily  . atorvastatin  40 mg Oral Daily  . cholecalciferol  1,000 Units Oral Daily  . feeding supplement (ENSURE ENLIVE)  237 mL Oral BID BM  . guaiFENesin  1,200 mg Oral BID  . heparin  5,000 Units Subcutaneous Q8H  . ipratropium-albuterol  3 mL Nebulization TID  . levothyroxine  50 mcg Oral QAC breakfast  . loratadine  10 mg Oral Daily  . losartan  50 mg Oral Daily  . multivitamin with minerals  1 tablet Oral Daily  . pantoprazole  40 mg Oral Daily  . tamsulosin  0.4 mg Oral Daily  . vitamin C  500 mg Oral Daily  . vitamin E  400 Units Oral Daily   Continuous: . sodium chloride Stopped (03/21/18 0202)  . azithromycin Stopped (03/20/18 1547)  . piperacillin-tazobactam (ZOSYN)  IV 3.375 g (03/21/18 0132)   ZJI:RCVELFYBOFBPZ, albuterol, traMADol  Assessment/Plan:    Community-acquired pneumonia/Acute respiratory failure with hypoxia Patient presented with shortness of breath and fever.  Chest x-ray did suggest pneumonia.  Patient was started on antibiotics including Zosyn and azithromycin.  Patient is improving.  WBC is normal.  Cultures are negative so far.  Try to wean down oxygen. Influenza PCR negative.  Strep pneumo antigen negative.  We will change to oral antibiotics tomorrow.  He has allergy to cephalosporins.  Fluoroquinolones to be avoided due to AAA.  Could consider continuing azithromycin but change to oral and then changing Zosyn to  Augmentin.  History of mild intermittent asthma Does not appear to be an active issue currently.  Continue to monitor.  History of coronary artery disease status post CABG Stable.  Continue home medications.  Essential hypertension Continue with home medications.  Blood pressure stable.  Hypothyroidism Continue with home medications.  History of AAA Recent study showed stability.  Fluoroquinolones to be avoided.  Obstructive sleep apnea Continue CPAP.  BPH Stable  Mildly elevated creatinine He likely has chronic kidney disease unknown stage.  Monitor urine output.  DVT Prophylaxis: Subcutaneous heparin    Code Status: Full code Family Communication: Discussed with the patient Disposition Plan: Management as outlined  above.  Mobilize as tolerated.  Anticipate that he will be able to go back home and improved.    LOS: 2 days   [facility] Pager [phone] 03/21/2018, 10:00 AM  If 7PM-7AM, please contact night-coverage at [email or web address], password [facility]

## 2018-03-22 LAB — LEGIONELLA PNEUMOPHILA SEROGP 1 UR AG: L. pneumophila Serogp 1 Ur Ag: NEGATIVE

## 2018-03-22 MED ORDER — AZITHROMYCIN 250 MG PO TABS
500.0000 mg | ORAL_TABLET | Freq: Every day | ORAL | Status: DC
  Administered 2018-03-22 – 2018-03-23 (×2): 500 mg via ORAL
  Filled 2018-03-22 (×2): qty 2

## 2018-03-22 MED ORDER — AMOXICILLIN-POT CLAVULANATE 875-125 MG PO TABS
1.0000 | ORAL_TABLET | Freq: Two times a day (BID) | ORAL | Status: DC
  Administered 2018-03-22 – 2018-03-23 (×3): 1 via ORAL
  Filled 2018-03-22 (×3): qty 1

## 2018-03-22 NOTE — Progress Notes (Signed)
[facility] PROGRESS NOTE  Eric Gordon YPP:[id number] DOB: 19-Oct-1930 DOA: 03/19/2018  PCP: Shon Baton, MD  Brief History/Interval Summary: 84 year old Caucasian male with a past medical history of coronary artery disease status post CABG, essential tremors, postoperative atrial fibrillation, previous history of PE but no longer on anticoagulation, presented with shortness of breath and fever.  He was found to have pneumonia and evaluation and was admitted to the hospital.  Reason for Visit: Community-acquired pneumonia  Consultants: None  Procedures: None  Antibiotics: Zosyn and azithromycin Zosyn to be discontinued on 7/1 Augmentin to be initiated on 7/1.  Subjective/Interval History: Patient states that he is feeling better.  Coughing up some sputum but has blood in it.  Overall his shortness of breath has improved.  No fever.  Denies any chest pain.   Patient has ambulated in the hallway.  ROS: Denies any headaches  Objective:  Vital Signs  Vitals:   03/21/18 1923 03/21/18 2342 03/22/18 0012 03/22/18 0826  BP:  (!) 146/74  (!) 157/72  Pulse:  72  63  Resp:  20  18  Temp:  (!) 100.5 F (38.1 C) 98.2 F (36.8 C)   TempSrc:  Oral Oral   SpO2: 92% 93%  97%  Weight:      Height:        Intake/Output Summary (Last 24 hours) at 03/22/2018 0924 Last data filed at 03/22/2018 0630 Gross per 24 hour  Intake 1668.46 ml  Output -  Net 1668.46 ml   Filed Weights   03/19/18 1229 03/19/18 1551  Weight: 105.7 kg (233 lb) 99.7 kg (219 lb 12.8 oz)    General appearance:.  Awake alert.  In no distress. Resp: Normal effort at rest.  Crackles at the baseS noted.  No wheezing or rhonchi.    Cardio: S1-S2 is normal regular.  No S3-S4.  No rubs murmurs or bruit. GI: Abdomen is soft.  Nontender nondistended Neurologic: Alert and oriented x3.  No focal deficits.  Lab Results:  Data Reviewed: I have personally reviewed following labs and imaging  studies  CBC: Recent Labs  Lab 03/19/18 1334 03/20/18 0227 03/21/18 0243  WBC 17.3* 16.7* 8.0  NEUTROABS 14.9*  --   --   HGB 15.5 14.1 13.4  HCT 49.0 44.3 42.2  MCV 96.6 95.9 95.7  PLT 133* 127* 458    Basic Metabolic Panel: Recent Labs  Lab 03/19/18 1334 03/20/18 0227 03/21/18 0243  NA 135 135 135  K 3.8 3.5 4.2  CL 97* 102 102  CO2 27 26 27   GLUCOSE 116* 109* 93  BUN 21 21 19   CREATININE 1.26* 1.20 1.29*  CALCIUM 8.9 8.7* 8.5*    GFR: Estimated Creatinine Clearance: 50.9 mL/min (A) (by C-G formula based on SCr of 1.29 mg/dL (H)).  Liver Function Tests: Recent Labs  Lab 03/19/18 1334  AST 22  ALT 14  ALKPHOS 65  BILITOT 1.4*  PROT 7.0  ALBUMIN 3.5     Recent Results (from the past 240 hour(s))  Culture, blood (Routine X 2) w Reflex to ID Panel     Status: None (Preliminary result)   Collection Time: 03/19/18  2:34 PM  Result Value Ref Range Status   Specimen Description BLOOD RIGHT ANTECUBITAL  Final   Special Requests   Final    BOTTLES DRAWN AEROBIC AND ANAEROBIC Blood Culture adequate volume   Culture   Final    NO GROWTH 2 DAYS Performed at [facility], [place]  [address] [place], [place] [address]    Report Status PENDING  Incomplete  Culture, blood (Routine X 2) w Reflex to ID Panel     Status: None (Preliminary result)   Collection Time: 03/19/18  2:34 PM  Result Value Ref Range Status   Specimen Description BLOOD LEFT ANTECUBITAL  Final   Special Requests   Final    BOTTLES DRAWN AEROBIC AND ANAEROBIC Blood Culture results may not be optimal due to an excessive volume of blood received in culture bottles   Culture   Final    NO GROWTH 2 DAYS Performed at [facility], [place] [address] [place], [place] [address]    Report Status PENDING  Incomplete      Radiology Studies: No results found.   Medications:  Scheduled: . amoxicillin-clavulanate  1 tablet Oral Q12H  . aspirin  81 mg Oral Daily  . atorvastatin  40 mg Oral  Daily  . azithromycin  500 mg Oral Daily  . cholecalciferol  1,000 Units Oral Daily  . feeding supplement (ENSURE ENLIVE)  237 mL Oral BID BM  . guaiFENesin  1,200 mg Oral BID  . heparin  5,000 Units Subcutaneous Q8H  . levothyroxine  50 mcg Oral QAC breakfast  . loratadine  10 mg Oral Daily  . losartan  50 mg Oral Daily  . multivitamin with minerals  1 tablet Oral Daily  . pantoprazole  40 mg Oral Daily  . tamsulosin  0.4 mg Oral Daily  . vitamin C  500 mg Oral Daily  . vitamin E  400 Units Oral Daily   Continuous: . sodium chloride 50 mL/hr at 03/22/18 0603   WEX:[id number], albuterol, traMADol  Assessment/Plan:    Community-acquired pneumonia/Acute respiratory failure with hypoxia Patient presented with shortness of breath and fever.  Chest x-ray did suggest pneumonia.  Patient was started on antibiotics including Zosyn and azithromycin.  Patient started improving.  His WBC is normal.  Cultures are negative so far.  He has been weaned down to room air.  Change to oral antibiotics.  Change Zosyn to Augmentin.  Continue azithromycin.  Influenza PCR negative.  Strep pneumo antigen negative.  Patient does report some blood-tinged sputum.  This is most likely secondary to pneumonia.  History of mild intermittent asthma Stable.  History of coronary artery disease status post CABG Stable.  Continue home medications.  Essential hypertension Continue with home medications.  Blood pressure stable.  Hypothyroidism Continue with home medications.  History of AAA Recent study showed stability.  Fluoroquinolones to be avoided.  Obstructive sleep apnea Continue CPAP.  BPH Stable  Mildly elevated creatinine He likely has chronic kidney disease unknown stage.  Monitor urine output.  DVT Prophylaxis: Subcutaneous heparin    Code Status: Full code Family Communication: Discussed with the patient Disposition Plan: Management as outlined above.  Change to oral antibiotics  today.  Anticipate discharge tomorrow.   LOS: 3 days   [facility] Pager [phone] 03/22/2018, 9:24 AM  If 7PM-7AM, please contact night-coverage at [email or web address], password [facility]

## 2018-03-22 NOTE — Care Management Important Message (Signed)
Important Message  Patient Details  Name: Eric Gordon MRN: [id number] Date of Birth: 17-Aug-1931   Medicare Important Message Given:  Yes    Eric Gordon 03/22/2018, 4:36 PM

## 2018-03-22 NOTE — Progress Notes (Signed)
Physical Therapy Treatment Patient Details Name: Eric Gordon MRN: [id number] DOB: [birth year] Today's Date: 03/22/2018    History of Present Illness Pt is a 84 y.o. male admitted 03/19/18 with generalized weakness, SOB and fever; CXR shows basilar pneumonia. PMH includes chronic LBP, CAD, essential tremors, AAA, OSA on CPAP, asthma.    PT Comments    Pt asleep upon arrival, easily awakened, agreeable to participate. Pt tolerating 324ft AMB this session on room air SpO2: 94% but with some DOE. Subjectively gait is slower than baseline, and now requiring a RW for gait instability. Pt has three LOB during session, one of which is fairly significant and requires modA physical assist to correct and prevent fall to floor. PT educated patient on importance of decreasing balance impairment with some OPPT at DC. Pt is agreeable. Pt reports his CPAP mask does not seem to be fitting his face well and reports it to be quite different than his unit at home.     Follow Up Recommendations  Outpatient PT(Pt with 3 LOB this session, balance impaired, needs intervention to decrease falls risk)     Equipment Recommendations  None recommended by PT(pt has a RW at home. )    Recommendations for Other Services       Precautions / Restrictions Precautions Precautions: Fall Restrictions Weight Bearing Restrictions: No    Mobility  Bed Mobility Overal bed mobility: Independent                Transfers Overall transfer level: Needs assistance Equipment used: None Transfers: Sit to/from Stand Sit to Stand: Supervision         General transfer comment: rises without frank LOB, but pt reports subjective weakness/instability   Ambulation/Gait Ambulation/Gait assistance: Min assist Gait Distance (Feet): 305 Feet Assistive device: Rolling walker (2 wheeled) Gait Pattern/deviations: Step-through pattern Gait velocity: 0.91m/s  Gait velocity interpretation: 1.31 - 2.62 ft/sec,  indicative of limited community ambulator General Gait Details: 3 LOB in session, once requiring min-modA to correct in spite of RW use; subjectively much slower at baseline. Pt denies balance impairment PTA.    Stairs             Wheelchair Mobility    Modified Rankin (Stroke Patients Only)       Balance Overall balance assessment: Needs assistance           Standing balance-Leahy Scale: Poor(in straight plan, balance is fair, but turning results in 3LOB in session unable to self correct 1)                              Cognition Arousal/Alertness: Awake/alert Behavior During Therapy: WFL for tasks assessed/performed Overall Cognitive Status: Within Functional Limits for tasks assessed                                        Exercises      General Comments        Pertinent Vitals/Pain Pain Assessment: No/denies pain    Home Living                      Prior Function            PT Goals (current goals can now be found in the care plan section) Acute Rehab PT Goals Patient Stated Goal: Return home breathing better PT  Goal Formulation: With patient Time For Goal Achievement: 04/03/18 Potential to Achieve Goals: Good Progress towards PT goals: Progressing toward goals    Frequency    Min 3X/week      PT Plan Discharge plan needs to be updated    Co-evaluation              AM-PAC PT "6 Clicks" Daily Activity  Outcome Measure  Difficulty turning over in bed (including adjusting bedclothes, sheets and blankets)?: None Difficulty moving from lying on back to sitting on the side of the bed? : None Difficulty sitting down on and standing up from a chair with arms (e.g., wheelchair, bedside commode, etc,.)?: None Help needed moving to and from a bed to chair (including a wheelchair)?: A Little Help needed walking in hospital room?: A Lot Help needed climbing 3-5 steps with a railing? : A Lot 6 Click Score:  19    End of Session   Activity Tolerance: Patient tolerated treatment well;Patient limited by fatigue Patient left: in bed;with call bell/phone within reach Nurse Communication: Mobility status PT Visit Diagnosis: Other abnormalities of gait and mobility (R26.89)     Time: 1537-9432 PT Time Calculation (min) (ACUTE ONLY): 12 min  Charges:  $Therapeutic Activity: 8-22 mins                    G Codes:      5:06 PM, Apr 10, 2018 Etta Grandchild, PT, DPT Physical Therapist - [place] [phone] (Pager)  [phone] (Office)      Farrah Skoda C 04-10-18, 5:03 PM

## 2018-03-23 MED ORDER — AMOXICILLIN-POT CLAVULANATE 875-125 MG PO TABS: 1.0000 | ORAL_TABLET | Freq: Two times a day (BID) | ORAL | 0 refills | Status: AC

## 2018-03-23 MED ORDER — AZITHROMYCIN 500 MG PO TABS: 500.0000 mg | ORAL_TABLET | Freq: Every day | ORAL | 0 refills | Status: AC

## 2018-03-23 MED ORDER — GUAIFENESIN ER 600 MG PO TB12: 600.0000 mg | ORAL_TABLET | Freq: Two times a day (BID) | ORAL | 0 refills | Status: DC

## 2018-03-23 NOTE — Progress Notes (Signed)
Physical Therapy Treatment Patient Details Name: Eric Gordon MRN: [id number] DOB: [birth year] Today's Date: 03/23/2018    History of Present Illness Pt is a 84 y.o. male admitted 03/19/18 with generalized weakness, SOB and fever; CXR shows basilar pneumonia. PMH includes chronic LBP, CAD, essential tremors, AAA, OSA on CPAP, asthma.   PT Comments    Pt progressing with mobility. Seen for stair training prior to d/c home today. Recommend follow-up with OP PT secondary to balance deficits and decreased activity tolerance.   Follow Up Recommendations  Outpatient PT     Equipment Recommendations  None recommended by PT    Recommendations for Other Services       Precautions / Restrictions Precautions Precautions: Fall Restrictions Weight Bearing Restrictions: No    Mobility  Bed Mobility Overal bed mobility: Independent                Transfers Overall transfer level: Needs assistance Equipment used: None Transfers: Sit to/from Stand Sit to Stand: Supervision            Ambulation/Gait Ambulation/Gait assistance: Min guard;Supervision Gait Distance (Feet): 300 Feet Assistive device: None Gait Pattern/deviations: Step-through pattern     General Gait Details: Reaching for intermittent UE support on hand rail. 3x instances of instability, but pt able to self correct. Cues to look forward versus at feet while walking   Stairs Stairs: Yes Stairs assistance: Supervision Stair Management: One rail Left;Forwards;Step to pattern;Alternating pattern Number of Stairs: 10 General stair comments: Ascend/descend steps with single UE support on rail; supervision for safety. Pt required 1x standing rest break secondary to fatigue with DOE 2/4   Wheelchair Mobility    Modified Rankin (Stroke Patients Only)       Balance Overall balance assessment: Needs assistance   Sitting balance-Leahy Scale: Good       Standing balance-Leahy Scale: Fair                              Cognition Arousal/Alertness: Awake/alert Behavior During Therapy: WFL for tasks assessed/performed Overall Cognitive Status: Within Functional Limits for tasks assessed                                        Exercises      General Comments General comments (skin integrity, edema, etc.): Wife present during session      Pertinent Vitals/Pain Pain Assessment: No/denies pain    Home Living                      Prior Function            PT Goals (current goals can now be found in the care plan section) Acute Rehab PT Goals Patient Stated Goal: Return home breathing better PT Goal Formulation: With patient Time For Goal Achievement: 04/03/18 Potential to Achieve Goals: Good Progress towards PT goals: Progressing toward goals    Frequency    Min 3X/week      PT Plan Current plan remains appropriate    Co-evaluation              AM-PAC PT "6 Clicks" Daily Activity  Outcome Measure  Difficulty turning over in bed (including adjusting bedclothes, sheets and blankets)?: None Difficulty moving from lying on back to sitting on the side of the bed? : None Difficulty sitting down on  and standing up from a chair with arms (e.g., wheelchair, bedside commode, etc,.)?: None Help needed moving to and from a bed to chair (including a wheelchair)?: None Help needed walking in hospital room?: A Little Help needed climbing 3-5 steps with a railing? : A Little 6 Click Score: 22    End of Session Equipment Utilized During Treatment: Gait belt Activity Tolerance: Patient tolerated treatment well Patient left: in bed;with call bell/phone within reach Nurse Communication: Mobility status PT Visit Diagnosis: Other abnormalities of gait and mobility (R26.89)     Time: 1031-2811 PT Time Calculation (min) (ACUTE ONLY): 10 min  Charges:  $Gait Training: 8-22 mins                    G Codes:      Mabeline Caras, PT,  DPT Acute Rehab Services  Pager: [place] 03/23/2018, 12:34 PM

## 2018-03-23 NOTE — Progress Notes (Signed)
Pt with no IV access during bedside report. Per Riley Kill RN, MD aware and okay with leaving IV access out since pt is medsurg and being discharged in the AM.

## 2018-03-23 NOTE — Discharge Instructions (Signed)

## 2018-03-23 NOTE — Care Management Note (Signed)
Case Management Note  Patient Details  Name: Eric Gordon MRN: [id number] Date of Birth: 1931/07/07  Subjective/Objective:    From home with wife, presents with CAP, for dc today, per pt eval rec outpatient physical therapy.  NCM made referral thru epic to [facility] outpatient rehab.  Noted on AVS.                Action/Plan: DC home when ready.  Expected Discharge Date:  03/23/18               Expected Discharge Plan:  Home/Self Care  In-House Referral:     Discharge planning Services  CM Consult  Post Acute Care Choice:    Choice offered to:     DME Arranged:    DME Agency:     HH Arranged:    HH Agency:     Status of Service:  Completed, signed off  If discussed at [employer] of Stay Meetings, dates discussed:    Additional Comments:  Zenon Mayo, RN 03/23/2018, 11:51 AM

## 2018-03-23 NOTE — Discharge Summary (Signed)
[facility]  Physician Discharge Summary   Patient ID: Eric Gordon MRN: [id number] DOB/AGE: 82-26-1932 82 y.o.  Admit date: 03/19/2018 Discharge date: 03/23/2018  PCP: Shon Baton, MD  DISCHARGE DIAGNOSES:  Community-acquired pneumonia  RECOMMENDATIONS FOR OUTPATIENT FOLLOW UP: 1. Follow-up with PCP in 1 week   DISCHARGE CONDITION: fair  Diet recommendation: as Before  Filed Weights   03/19/18 1229 03/19/18 1551  Weight: 105.7 kg (233 lb) 99.7 kg (219 lb 12.8 oz)    INITIAL HISTORY: 84 year old Caucasian male with a past medical history of coronary artery disease status post CABG, essential tremors, postoperative atrial fibrillation, previous history of PE but no longer on anticoagulation, presented with shortness of breath and fever.  He was found to have pneumonia and evaluation and was admitted to the hospital.   HOSPITAL COURSE:   Community-acquired pneumonia/Acute respiratory failure with hypoxia Patient presented with shortness of breath and fever.  Chest x-ray did suggest pneumonia.  Patient was started on antibiotics including Zosyn and azithromycin.  Patient started improving.  His WBC is normal.  Cultures are negative so far.  He has been weaned down to room air.    He did have some blood-tinged sputum this most likely due to pneumonia.  Symptomatically however he has improved significantly.  He was changed over to oral Augmentin and azithromycin.  Patient has ambulated.  He saturating normal.  Vital signs are stable.  He feels better today.  Wants to go home.    History of mild intermittent asthma Stable.  History of coronary artery disease status post CABG Stable.  Continue home medications.  Essential hypertension Continue with home medications.  Blood pressure stable.  Hypothyroidism Continue with home medications.  History of AAA Recent study showed stability.    Obstructive sleep apnea Continue CPAP.  BPH Stable  Mildly  elevated creatinine He likely has chronic kidney disease unknown stage.  Overall stable.  Okay for discharge home today.    PERTINENT LABS:  The results of significant diagnostics from this hospitalization (including imaging, microbiology, ancillary and laboratory) are listed below for reference.    Microbiology: Recent Results (from the past 240 hour(s))  Culture, blood (Routine X 2) w Reflex to ID Panel     Status: None (Preliminary result)   Collection Time: 03/19/18  2:34 PM  Result Value Ref Range Status   Specimen Description BLOOD RIGHT ANTECUBITAL  Final   Special Requests   Final    BOTTLES DRAWN AEROBIC AND ANAEROBIC Blood Culture adequate volume   Culture   Final    NO GROWTH 4 DAYS Performed at [facility], [address], [place], [place] [address]    Report Status PENDING  Incomplete  Culture, blood (Routine X 2) w Reflex to ID Panel     Status: None (Preliminary result)   Collection Time: 03/19/18  2:34 PM  Result Value Ref Range Status   Specimen Description BLOOD LEFT ANTECUBITAL  Final   Special Requests   Final    BOTTLES DRAWN AEROBIC AND ANAEROBIC Blood Culture results may not be optimal due to an excessive volume of blood received in culture bottles   Culture   Final    NO GROWTH 4 DAYS Performed at [facility], [place] [address], [place], [place] [address]    Report Status PENDING  Incomplete     Labs: Basic Metabolic Panel: Recent Labs  Lab 03/19/18 1334 03/20/18 0227 03/21/18 0243  NA 135 135 135  K 3.8 3.5  4.2  CL 97* 102 102  CO2 27 26 27   GLUCOSE 116* 109* 93  BUN 21 21 19   CREATININE 1.26* 1.20 1.29*  CALCIUM 8.9 8.7* 8.5*   Liver Function Tests: Recent Labs  Lab 03/19/18 1334  AST 22  ALT 14  ALKPHOS 65  BILITOT 1.4*  PROT 7.0  ALBUMIN 3.5   CBC: Recent Labs  Lab 03/19/18 1334 03/20/18 0227 03/21/18 0243  WBC 17.3* 16.7* 8.0  NEUTROABS 14.9*  --   --   HGB 15.5 14.1 13.4  HCT 49.0 44.3 42.2  MCV 96.6  95.9 95.7  PLT 133* 127* 154   BNP: BNP (last 3 results) Recent Labs    03/19/18 1335  BNP 309.1*     IMAGING STUDIES Dg Chest 2 View  Result Date: 03/19/2018 CLINICAL DATA:  Acute shortness of breath and fever. EXAM: CHEST - 2 VIEW COMPARISON:  12/21/2015 and prior chest radiographs FINDINGS: Cardiomediastinal silhouette is unchanged. CABG changes again identified. Apparent increased opacity within the posterior lower lobe on the LATERAL view noted and may represent pneumonia. There is no evidence of pleural effusion, pneumothorax, pulmonary mass or acute bony abnormality. IMPRESSION: Apparent opacity within the posterior lower lobe on the LATERAL view-question pneumonia. Electronically Signed   By: Margarette Canada M.D.   On: 03/19/2018 13:34    DISCHARGE EXAMINATION: Vitals:   03/22/18 1657 03/22/18 1727 03/23/18 0000 03/23/18 0846  BP:  (!) 143/59 (!) 152/61 (!) 166/79  Pulse: 83 68 67 72  Resp:  18 18   Temp:  98.1 F (36.7 C) 97.8 F (36.6 C) 97.6 F (36.4 C)  TempSrc:  Oral Oral Oral  SpO2: 94% 96% 96% 95%  Weight:      Height:       General appearance: alert, cooperative, appears stated age and no distress Resp: Normal effort at rest.  Much improved air entry bilaterally.  No wheezing rales or rhonchi.  Crackles at the right base. Cardio: regular rate and rhythm, S1, S2 normal, no murmur, click, rub or gallop GI: soft, non-tender; bowel sounds normal; no masses,  no organomegaly  DISPOSITION: Home  Discharge Instructions    Call MD for:  difficulty breathing, headache or visual disturbances   Complete by:  As directed    Call MD for:  extreme fatigue   Complete by:  As directed    Call MD for:  persistant dizziness or light-headedness   Complete by:  As directed    Call MD for:  persistant nausea and vomiting   Complete by:  As directed    Call MD for:  severe uncontrolled pain   Complete by:  As directed    Call MD for:  temperature >100.4   Complete by:  As  directed    Diet - low sodium heart healthy   Complete by:  As directed    Discharge instructions   Complete by:  As directed    You will need a repeat chest x-ray in 4 weeks time.  Discuss this with your primary care provider at follow-up in 1 week.  You were cared for by a hospitalist during your hospital stay. If you have any questions about your discharge medications or the care you received while you were in the hospital after you are discharged, you can call the unit and asked to speak with the hospitalist on call if the hospitalist that took care of you is not available. Once you are discharged, your primary care physician will  handle any further medical issues. Please note that NO REFILLS for any discharge medications will be authorized once you are discharged, as it is imperative that you return to your primary care physician (or establish a relationship with a primary care physician if you do not have one) for your aftercare needs so that they can reassess your need for medications and monitor your lab values. If you do not have a primary care physician, you can call [phone] for a physician referral.   Increase activity slowly   Complete by:  As directed         Allergies as of 03/23/2018      Reactions   Ancef [cefazolin Sodium] Hives   Vancomycin Rash      Medication List    TAKE these medications   amoxicillin-clavulanate 875-125 MG tablet Commonly known as:  AUGMENTIN Take 1 tablet by mouth every 12 (twelve) hours for 5 days.   Ascorbic Acid 500 MG Caps Take 500 mg by mouth daily.   aspirin 81 MG tablet Take 81 mg by mouth daily.   atorvastatin 40 MG tablet Commonly known as:  LIPITOR Take 40 mg by mouth daily.   azithromycin 500 MG tablet Commonly known as:  ZITHROMAX Take 1 tablet (500 mg total) by mouth daily for 3 days.   CENTRUM SILVER PO Take 1 tablet by mouth daily.   cetirizine 10 MG tablet Commonly known as:  ZYRTEC Take 10 mg by mouth daily.     cholecalciferol 1000 units tablet Commonly known as:  VITAMIN D Take 1,000 Units by mouth daily.   guaiFENesin 600 MG 12 hr tablet Commonly known as:  MUCINEX Take 1 tablet (600 mg total) by mouth 2 (two) times daily.   levothyroxine 50 MCG tablet Commonly known as:  SYNTHROID, LEVOTHROID Take 50 mcg by mouth daily.   losartan 50 MG tablet Commonly known as:  COZAAR TAKE ONE TABLET BY MOUTH ONCE DAILY What changed:    how much to take  how to take this  when to take this   pantoprazole 40 MG tablet Commonly known as:  PROTONIX Take 40 mg by mouth daily.   tamsulosin 0.4 MG Caps capsule Commonly known as:  FLOMAX Take 0.4 mg by mouth daily.   traMADol 50 MG tablet Commonly known as:  ULTRAM Take 50 mg by mouth 3 (three) times daily as needed for moderate pain.   vitamin E 400 UNIT capsule Take 400 Units by mouth daily.        Follow-up Information    Shon Baton, MD. Schedule an appointment as soon as possible for a visit in 1 week(s).   Specialty:  Internal Medicine Contact information: [address] [place] [address] [phone]        [facility] Follow up.   Specialty:  Rehabilitation Why:  they will call you to schedule apt time, if you do not hear from them in two days please feel free to call them. Contact information: [address] [id number] [place]          TOTAL DISCHARGE TIME: 67 minutes  Bonnielee Haff  [facility] Pager [phone]  03/23/2018, 1:29 PM

## 2018-03-23 NOTE — Progress Notes (Signed)
Pt discharged to home with orders for PT. Pt stable upon discharge. Pt given discharge instructions with wife at bedside, they had no further questions. Pts belongings returned.  Riley Kill RN

## 2018-03-23 NOTE — Consult Note (Signed)
Eric Gordon CM Primary Care Navigator  03/23/2018  Eric Gordon 09-Sep-1931 [id number]   Met with patient and wife Eric Gordon) atthe bedside to identify possible discharge needs.  Wifereportsthat patient washaving"high fever/ chills and weakness "that resultedto this admission.(Community-acquired pneumonia)  Patient endorses Dr. Shon Baton with [place] as hisprimary care provider.   Patient [facility], [facility] and [facility] [address] obtain medications without difficulty.   Patient reportsmanaginghisown medicationsat homeusing "pill box" system filled once a month.  Patientmentioned thathe has been driving prior to admission, but wife will provide transportationto hisdoctors' appointmentsif needed after discharge.  Patient liveswith wife at home who will serve as his primary caregiver as stated.  Anticipated dischargeplanis home with Outpatient rehab per therapy recommendation.  Patientandwifevoicedunderstanding to call primary care provider's office for a post discharge follow-up appointment within 1- 2 weeksor sooner if needs arise.Patient letter (with PCP's contact number) was provided astheirreminder.  Explained to patientandwiferegardingTHN CM services available for health management at home butboth denied any current needs or concerns at this time but patient had optedand verbally agreedforEMMI Pneumoniacalls tofollow-up recovery.  Referralmade forEMMI Pneumonia Calls after discharge.  Patientand wife expressed understandingto seekreferral to [facility] care managementfrom primary care provider ifdeemed necessary andappropriateforanyservices in thefuture.  [facility] care management information provided for future needs thatpatientmay have.   For additional questions please contact:  Eric Gordon, BSN,  RN-BC [facility] PRIMARY CARE Navigator Cell: [phone]

## 2018-03-23 NOTE — Progress Notes (Signed)
Patient requires no assistance with hospital CPAP at this time, RCP will continue to follow. Patient tolerating well at this time.

## 2018-03-24 LAB — CULTURE, BLOOD (ROUTINE X 2)
CULTURE: NO GROWTH
Culture: NO GROWTH
Special Requests: ADEQUATE

## 2018-03-29 ENCOUNTER — Other Ambulatory Visit: Payer: Self-pay

## 2018-03-29 NOTE — Patient Outreach (Addendum)
[facility]) Care Management  03/29/2018  Eric Gordon 1930-10-19 [id number]   EMMI- Pneumonia RED ON EMMI ALERT Day # 3 Date: 03/26/18 Red Alert Reason: Been to follow up appointment? No  Day #4 03/27/18 Wake up with shortness of breath when lying flat.?yes  03/28/18 Day #5 More short of breath than yesterday? yes  Outreach attempt # 1 Spoke with patient.  He is able to verify HIPAA.  Patient states he is doing ok. He states he has some weakness and some shortness of breath at times.   Patient denies any fever or chills in the last couple of days. He states he finished his antibiotic yesterday.  Discussed with patient to monitor for any signs of worsening such as increasing shortness of breath, fever, chills, and cough.  He verbalized understanding.  Addressed red alert with patient.  He states that he sees his primary care physician on tomorrow.  He states that his wife will be taking him.  Patient has no questions or concerns.     Plan: RN CM will close case.    Jone Baseman, RN, MSN [facility] Care Management Care Management Coordinator Direct Line [phone] Toll Free: [phone]  Fax: [phone]

## 2018-03-30 ENCOUNTER — Other Ambulatory Visit: Payer: Self-pay

## 2018-03-30 DIAGNOSIS — Z6827 Body mass index (BMI) 27.0-27.9, adult: Secondary | ICD-10-CM | POA: Diagnosis not present

## 2018-03-30 DIAGNOSIS — I5022 Chronic systolic (congestive) heart failure: Secondary | ICD-10-CM | POA: Diagnosis not present

## 2018-03-30 DIAGNOSIS — J189 Pneumonia, unspecified organism: Secondary | ICD-10-CM | POA: Diagnosis not present

## 2018-03-30 DIAGNOSIS — G4733 Obstructive sleep apnea (adult) (pediatric): Secondary | ICD-10-CM | POA: Diagnosis not present

## 2018-03-30 DIAGNOSIS — Z758 Other problems related to medical facilities and other health care: Secondary | ICD-10-CM | POA: Diagnosis not present

## 2018-03-30 DIAGNOSIS — J45909 Unspecified asthma, uncomplicated: Secondary | ICD-10-CM | POA: Diagnosis not present

## 2018-03-30 DIAGNOSIS — E038 Other specified hypothyroidism: Secondary | ICD-10-CM | POA: Diagnosis not present

## 2018-03-30 DIAGNOSIS — K219 Gastro-esophageal reflux disease without esophagitis: Secondary | ICD-10-CM | POA: Diagnosis not present

## 2018-03-30 DIAGNOSIS — E119 Type 2 diabetes mellitus without complications: Secondary | ICD-10-CM | POA: Diagnosis not present

## 2018-03-30 DIAGNOSIS — I6529 Occlusion and stenosis of unspecified carotid artery: Secondary | ICD-10-CM | POA: Diagnosis not present

## 2018-03-30 DIAGNOSIS — I2510 Atherosclerotic heart disease of native coronary artery without angina pectoris: Secondary | ICD-10-CM | POA: Diagnosis not present

## 2018-03-30 DIAGNOSIS — R0609 Other forms of dyspnea: Secondary | ICD-10-CM | POA: Diagnosis not present

## 2018-03-30 NOTE — Patient Outreach (Signed)
[facility]) Care Management  03/30/2018  MACALLISTER ASHMEAD [birth year] [id number]   EMMI- pneumonia RED ON EMMI ALERT Day # 6 Date: 03/29/18 Red Alert Reason:  More short of breath than yesterday? Yes  Outreach attempt # 1 spoke with patient.  He is able to verify HIPAA.  Patient reports that he has some shortness of breath.  He states that he saw the doctor today and was ordered inhalers to use for occasional shortness of breath.  He states that the doctor seems to think he needs to take it easy the next couple of weeks.  He states that a repeat chest xray has been recommended in 2 weeks.  Discussed with patient worsening pneumonia and notifying the physician as soon as possible.  He verbalized understanding.   Patient declines any needs at this time.     Plan: RN CM will close case.   Jone Baseman, RN, MSN [facility] Care Management Care Management Coordinator Direct Line [phone] Toll Free: [phone]  Fax: [phone]

## 2018-04-01 ENCOUNTER — Other Ambulatory Visit: Payer: Self-pay

## 2018-04-01 NOTE — Patient Outreach (Signed)
[place]) Care Management  04/01/2018  Eric Gordon [birth year] [id number]   EMMI- Pneumonia RED ON EMMI ALERT Day # 8 Date: 03/31/18 Red Alert Reason:  Wheezing more than yesterday? yes  Outreach attempt # 1 Spoke with patient.  He is able to verify HIPAA.  Discussed with patient red flag.  Patient states that he is not having any problems with wheezing and that the response was recorded incorrectly.  Asked patient about shortness of breath.  He states that he is not having any increased shortness of breath and that he was able to exercise and walk around the house today. Patient reports that he is using the inhaler as needed and voices no concerns.     Plan: RN CM will close case at this time.   Jone Baseman, RN, MSN [facility] Care Management Care Management Coordinator Direct Line [phone] Toll Free: [phone]  Fax: [phone]

## 2018-04-13 DIAGNOSIS — J189 Pneumonia, unspecified organism: Secondary | ICD-10-CM | POA: Diagnosis not present

## 2018-04-13 DIAGNOSIS — I714 Abdominal aortic aneurysm, without rupture: Secondary | ICD-10-CM | POA: Diagnosis not present

## 2018-04-13 DIAGNOSIS — J45998 Other asthma: Secondary | ICD-10-CM | POA: Diagnosis not present

## 2018-04-13 DIAGNOSIS — I2510 Atherosclerotic heart disease of native coronary artery without angina pectoris: Secondary | ICD-10-CM | POA: Diagnosis not present

## 2018-04-13 DIAGNOSIS — I5022 Chronic systolic (congestive) heart failure: Secondary | ICD-10-CM | POA: Diagnosis not present

## 2018-04-13 DIAGNOSIS — E119 Type 2 diabetes mellitus without complications: Secondary | ICD-10-CM | POA: Diagnosis not present

## 2018-04-13 DIAGNOSIS — Z6828 Body mass index (BMI) 28.0-28.9, adult: Secondary | ICD-10-CM | POA: Diagnosis not present

## 2018-05-31 ENCOUNTER — Ambulatory Visit (HOSPITAL_COMMUNITY)
Admission: RE | Admit: 2018-05-31 | Discharge: 2018-05-31 | Disposition: A | Discharge status: Home / Self Care | Payer: Medicare Other | Source: Ambulatory Visit | Attending: Cardiology | Admitting: Cardiology

## 2018-05-31 DIAGNOSIS — I6523 Occlusion and stenosis of bilateral carotid arteries: Secondary | ICD-10-CM | POA: Diagnosis not present

## 2018-06-08 ENCOUNTER — Telehealth (HOSPITAL_COMMUNITY): Payer: Self-pay | Admitting: *Deleted

## 2018-06-08 NOTE — Telephone Encounter (Signed)
Result Notes for VAS US CAROTID   Notes recorded by Darron Doom, RN on 06/08/2018 at 3:31 PM EDT Called and left detailed message and left number for him to call back if he had any questions. ------  Notes recorded by Larey Dresser, MD on 06/03/2018 at 9:93 PM EDT 71-69% LICA stenosis, 67-89% RICA stenosis. Repeat in 1 year.

## 2018-07-14 DIAGNOSIS — N401 Enlarged prostate with lower urinary tract symptoms: Secondary | ICD-10-CM | POA: Diagnosis not present

## 2018-07-14 DIAGNOSIS — R350 Frequency of micturition: Secondary | ICD-10-CM | POA: Diagnosis not present

## 2018-07-14 DIAGNOSIS — R972 Elevated prostate specific antigen [PSA]: Secondary | ICD-10-CM | POA: Diagnosis not present

## 2018-07-20 DIAGNOSIS — Z6828 Body mass index (BMI) 28.0-28.9, adult: Secondary | ICD-10-CM | POA: Diagnosis not present

## 2018-07-20 DIAGNOSIS — I350 Nonrheumatic aortic (valve) stenosis: Secondary | ICD-10-CM | POA: Diagnosis not present

## 2018-07-20 DIAGNOSIS — E668 Other obesity: Secondary | ICD-10-CM | POA: Diagnosis not present

## 2018-07-20 DIAGNOSIS — I6529 Occlusion and stenosis of unspecified carotid artery: Secondary | ICD-10-CM | POA: Diagnosis not present

## 2018-07-20 DIAGNOSIS — I119 Hypertensive heart disease without heart failure: Secondary | ICD-10-CM | POA: Diagnosis not present

## 2018-07-20 DIAGNOSIS — Z1389 Encounter for screening for other disorder: Secondary | ICD-10-CM | POA: Diagnosis not present

## 2018-07-20 DIAGNOSIS — E038 Other specified hypothyroidism: Secondary | ICD-10-CM | POA: Diagnosis not present

## 2018-07-20 DIAGNOSIS — I714 Abdominal aortic aneurysm, without rupture: Secondary | ICD-10-CM | POA: Diagnosis not present

## 2018-07-20 DIAGNOSIS — I2510 Atherosclerotic heart disease of native coronary artery without angina pectoris: Secondary | ICD-10-CM | POA: Diagnosis not present

## 2018-07-20 DIAGNOSIS — I5022 Chronic systolic (congestive) heart failure: Secondary | ICD-10-CM | POA: Diagnosis not present

## 2018-07-20 DIAGNOSIS — E119 Type 2 diabetes mellitus without complications: Secondary | ICD-10-CM | POA: Diagnosis not present

## 2018-08-13 DIAGNOSIS — R3915 Urgency of urination: Secondary | ICD-10-CM | POA: Diagnosis not present

## 2018-08-13 DIAGNOSIS — R350 Frequency of micturition: Secondary | ICD-10-CM | POA: Diagnosis not present

## 2018-08-13 DIAGNOSIS — N401 Enlarged prostate with lower urinary tract symptoms: Secondary | ICD-10-CM | POA: Diagnosis not present

## 2018-08-17 DIAGNOSIS — M5136 Other intervertebral disc degeneration, lumbar region: Secondary | ICD-10-CM | POA: Diagnosis not present

## 2018-10-17 ENCOUNTER — Inpatient Hospital Stay (HOSPITAL_COMMUNITY)
Admission: EM | Admit: 2018-10-17 | Discharge: 2018-10-19 | DRG: 280 | Disposition: A | Discharge status: Home / Self Care | Payer: Medicare Other | Attending: Cardiology | Admitting: Cardiology

## 2018-10-17 ENCOUNTER — Emergency Department (HOSPITAL_COMMUNITY): Payer: Medicare Other

## 2018-10-17 ENCOUNTER — Encounter (HOSPITAL_COMMUNITY): Payer: Self-pay | Admitting: Emergency Medicine

## 2018-10-17 DIAGNOSIS — I5033 Acute on chronic diastolic (congestive) heart failure: Secondary | ICD-10-CM | POA: Diagnosis present

## 2018-10-17 DIAGNOSIS — I714 Abdominal aortic aneurysm, without rupture: Secondary | ICD-10-CM | POA: Diagnosis present

## 2018-10-17 DIAGNOSIS — I350 Nonrheumatic aortic (valve) stenosis: Secondary | ICD-10-CM | POA: Diagnosis present

## 2018-10-17 DIAGNOSIS — Z9989 Dependence on other enabling machines and devices: Secondary | ICD-10-CM

## 2018-10-17 DIAGNOSIS — I452 Bifascicular block: Secondary | ICD-10-CM | POA: Diagnosis present

## 2018-10-17 DIAGNOSIS — I25810 Atherosclerosis of coronary artery bypass graft(s) without angina pectoris: Secondary | ICD-10-CM | POA: Diagnosis not present

## 2018-10-17 DIAGNOSIS — Z86711 Personal history of pulmonary embolism: Secondary | ICD-10-CM

## 2018-10-17 DIAGNOSIS — Z79899 Other long term (current) drug therapy: Secondary | ICD-10-CM

## 2018-10-17 DIAGNOSIS — Z79890 Hormone replacement therapy (postmenopausal): Secondary | ICD-10-CM

## 2018-10-17 DIAGNOSIS — K219 Gastro-esophageal reflux disease without esophagitis: Secondary | ICD-10-CM | POA: Diagnosis present

## 2018-10-17 DIAGNOSIS — Z7902 Long term (current) use of antithrombotics/antiplatelets: Secondary | ICD-10-CM

## 2018-10-17 DIAGNOSIS — I4589 Other specified conduction disorders: Secondary | ICD-10-CM | POA: Diagnosis not present

## 2018-10-17 DIAGNOSIS — I6529 Occlusion and stenosis of unspecified carotid artery: Secondary | ICD-10-CM | POA: Diagnosis present

## 2018-10-17 DIAGNOSIS — Z7901 Long term (current) use of anticoagulants: Secondary | ICD-10-CM

## 2018-10-17 DIAGNOSIS — E039 Hypothyroidism, unspecified: Secondary | ICD-10-CM | POA: Diagnosis not present

## 2018-10-17 DIAGNOSIS — I2510 Atherosclerotic heart disease of native coronary artery without angina pectoris: Secondary | ICD-10-CM | POA: Diagnosis not present

## 2018-10-17 DIAGNOSIS — I214 Non-ST elevation (NSTEMI) myocardial infarction: Principal | ICD-10-CM | POA: Diagnosis present

## 2018-10-17 DIAGNOSIS — R001 Bradycardia, unspecified: Secondary | ICD-10-CM | POA: Diagnosis not present

## 2018-10-17 DIAGNOSIS — G8929 Other chronic pain: Secondary | ICD-10-CM | POA: Diagnosis not present

## 2018-10-17 DIAGNOSIS — Z87891 Personal history of nicotine dependence: Secondary | ICD-10-CM

## 2018-10-17 DIAGNOSIS — G4733 Obstructive sleep apnea (adult) (pediatric): Secondary | ICD-10-CM | POA: Diagnosis present

## 2018-10-17 DIAGNOSIS — Z7982 Long term (current) use of aspirin: Secondary | ICD-10-CM

## 2018-10-17 DIAGNOSIS — E7800 Pure hypercholesterolemia, unspecified: Secondary | ICD-10-CM | POA: Diagnosis not present

## 2018-10-17 DIAGNOSIS — Z79891 Long term (current) use of opiate analgesic: Secondary | ICD-10-CM

## 2018-10-17 DIAGNOSIS — Z8249 Family history of ischemic heart disease and other diseases of the circulatory system: Secondary | ICD-10-CM

## 2018-10-17 DIAGNOSIS — I110 Hypertensive heart disease with heart failure: Secondary | ICD-10-CM | POA: Diagnosis not present

## 2018-10-17 DIAGNOSIS — Z881 Allergy status to other antibiotic agents status: Secondary | ICD-10-CM

## 2018-10-17 DIAGNOSIS — R079 Chest pain, unspecified: Secondary | ICD-10-CM | POA: Diagnosis not present

## 2018-10-17 DIAGNOSIS — E785 Hyperlipidemia, unspecified: Secondary | ICD-10-CM | POA: Diagnosis present

## 2018-10-17 LAB — CBC WITH DIFFERENTIAL/PLATELET
ABS IMMATURE GRANULOCYTES: 0.05 10*3/uL (ref 0.00–0.07)
BASOS PCT: 0 %
Basophils Absolute: 0 10*3/uL (ref 0.0–0.1)
Eosinophils Absolute: 0 10*3/uL (ref 0.0–0.5)
Eosinophils Relative: 0 %
HCT: 48.6 % (ref 39.0–52.0)
Hemoglobin: 15.3 g/dL (ref 13.0–17.0)
Immature Granulocytes: 1 %
Lymphocytes Relative: 11 %
Lymphs Abs: 1.2 10*3/uL (ref 0.7–4.0)
MCH: 30.4 pg (ref 26.0–34.0)
MCHC: 31.5 g/dL (ref 30.0–36.0)
MCV: 96.6 fL (ref 80.0–100.0)
MONO ABS: 0.7 10*3/uL (ref 0.1–1.0)
Monocytes Relative: 6 %
NEUTROS ABS: 8.7 10*3/uL — AB (ref 1.7–7.7)
NEUTROS PCT: 82 %
PLATELETS: 169 10*3/uL (ref 150–400)
RBC: 5.03 MIL/uL (ref 4.22–5.81)
RDW: 13.9 % (ref 11.5–15.5)
WBC: 10.6 10*3/uL — AB (ref 4.0–10.5)
nRBC: 0 % (ref 0.0–0.2)

## 2018-10-17 LAB — BASIC METABOLIC PANEL
Anion gap: 11 (ref 5–15)
BUN: 24 mg/dL — AB (ref 8–23)
CALCIUM: 9.5 mg/dL (ref 8.9–10.3)
CO2: 26 mmol/L (ref 22–32)
CREATININE: 1.05 mg/dL (ref 0.61–1.24)
Chloride: 101 mmol/L (ref 98–111)
GFR calc Af Amer: >60 mL/min (ref >60)
Glucose, Bld: 140 mg/dL — ABNORMAL HIGH (ref 70–99)
Potassium: 3.9 mmol/L (ref 3.5–5.1)
SODIUM: 138 mmol/L (ref 135–145)

## 2018-10-17 LAB — HEPARIN LEVEL (UNFRACTIONATED): HEPARIN UNFRACTIONATED: 0.44 [IU]/mL (ref 0.30–0.70)

## 2018-10-17 LAB — I-STAT TROPONIN, ED: TROPONIN I, POC: 1.34 ng/mL — AB (ref 0.00–0.08)

## 2018-10-17 LAB — TROPONIN I
TROPONIN I: 7.75 ng/mL — AB (ref <0.03)
Troponin I: 2.42 ng/mL (ref <0.03)
Troponin I: 3.29 ng/mL (ref <0.03)

## 2018-10-17 MED ORDER — LEVOTHYROXINE SODIUM 50 MCG PO TABS
50.0000 ug | ORAL_TABLET | Freq: Every day | ORAL | Status: DC
  Administered 2018-10-18 – 2018-10-19 (×2): 50 ug via ORAL
  Filled 2018-10-17 (×2): qty 1

## 2018-10-17 MED ORDER — TRAMADOL HCL 50 MG PO TABS: 50.0000 mg | ORAL_TABLET | Freq: Three times a day (TID) | ORAL | Status: DC | PRN

## 2018-10-17 MED ORDER — MORPHINE SULFATE (PF) 4 MG/ML IV SOLN
4.0000 mg | Freq: Once | INTRAVENOUS | Status: AC
  Administered 2018-10-17: 4 mg via INTRAVENOUS
  Filled 2018-10-17: qty 1

## 2018-10-17 MED ORDER — MORPHINE SULFATE (PF) 2 MG/ML IV SOLN
2.0000 mg | Freq: Once | INTRAVENOUS | Status: AC
  Administered 2018-10-17: 2 mg via INTRAVENOUS
  Filled 2018-10-17: qty 1

## 2018-10-17 MED ORDER — ONDANSETRON HCL 4 MG/2ML IJ SOLN: 4.0000 mg | Freq: Four times a day (QID) | INTRAMUSCULAR | Status: DC | PRN

## 2018-10-17 MED ORDER — HEPARIN BOLUS VIA INFUSION
4000.0000 [IU] | Freq: Once | INTRAVENOUS | Status: AC
  Administered 2018-10-17: 4000 [IU] via INTRAVENOUS
  Filled 2018-10-17: qty 4000

## 2018-10-17 MED ORDER — LORATADINE 10 MG PO TABS
10.0000 mg | ORAL_TABLET | Freq: Every day | ORAL | Status: DC
  Administered 2018-10-18 – 2018-10-19 (×2): 10 mg via ORAL
  Filled 2018-10-17 (×2): qty 1

## 2018-10-17 MED ORDER — SODIUM CHLORIDE 0.9% FLUSH
3.0000 mL | Freq: Two times a day (BID) | INTRAVENOUS | Status: DC
  Administered 2018-10-17: 3 mL via INTRAVENOUS

## 2018-10-17 MED ORDER — SODIUM CHLORIDE 0.9% FLUSH: 3.0000 mL | INTRAVENOUS | Status: DC | PRN

## 2018-10-17 MED ORDER — HEPARIN (PORCINE) 25000 UT/250ML-% IV SOLN
1400.0000 [IU]/h | INTRAVENOUS | Status: DC
  Administered 2018-10-17 – 2018-10-18 (×2): 1400 [IU]/h via INTRAVENOUS
  Filled 2018-10-17 (×2): qty 250

## 2018-10-17 MED ORDER — PANTOPRAZOLE SODIUM 40 MG PO TBEC
40.0000 mg | DELAYED_RELEASE_TABLET | Freq: Every day | ORAL | Status: DC
  Administered 2018-10-18 – 2018-10-19 (×2): 40 mg via ORAL
  Filled 2018-10-17 (×2): qty 1

## 2018-10-17 MED ORDER — ASPIRIN 81 MG PO CHEW: 81.0000 mg | CHEWABLE_TABLET | ORAL | Status: DC

## 2018-10-17 MED ORDER — ASPIRIN EC 81 MG PO TBEC
81.0000 mg | DELAYED_RELEASE_TABLET | Freq: Every day | ORAL | Status: DC
  Administered 2018-10-18 – 2018-10-19 (×2): 81 mg via ORAL
  Filled 2018-10-17 (×3): qty 1

## 2018-10-17 MED ORDER — ACETAMINOPHEN 325 MG PO TABS
650.0000 mg | ORAL_TABLET | ORAL | Status: DC | PRN
  Administered 2018-10-18 (×2): 650 mg via ORAL
  Filled 2018-10-17 (×2): qty 2

## 2018-10-17 MED ORDER — MORPHINE SULFATE (PF) 2 MG/ML IV SOLN
2.0000 mg | INTRAVENOUS | Status: DC | PRN
  Administered 2018-10-17 – 2018-10-18 (×2): 2 mg via INTRAVENOUS
  Filled 2018-10-17 (×2): qty 1

## 2018-10-17 MED ORDER — NITROGLYCERIN 0.4 MG SL SUBL
0.4000 mg | SUBLINGUAL_TABLET | Freq: Once | SUBLINGUAL | Status: AC
  Administered 2018-10-17: 0.4 mg via SUBLINGUAL
  Filled 2018-10-17: qty 1

## 2018-10-17 MED ORDER — SODIUM CHLORIDE 0.9 % WEIGHT BASED INFUSION: 3.0000 mL/kg/h | INTRAVENOUS | Status: DC

## 2018-10-17 MED ORDER — SODIUM CHLORIDE 0.9 % IV SOLN: 250.0000 mL | INTRAVENOUS | Status: DC | PRN

## 2018-10-17 MED ORDER — TAMSULOSIN HCL 0.4 MG PO CAPS
0.4000 mg | ORAL_CAPSULE | Freq: Every day | ORAL | Status: DC
  Administered 2018-10-18 – 2018-10-19 (×2): 0.4 mg via ORAL
  Filled 2018-10-17 (×2): qty 1

## 2018-10-17 MED ORDER — ATORVASTATIN CALCIUM 40 MG PO TABS
40.0000 mg | ORAL_TABLET | Freq: Every day | ORAL | Status: DC
  Administered 2018-10-17 – 2018-10-19 (×3): 40 mg via ORAL
  Filled 2018-10-17 (×3): qty 1

## 2018-10-17 MED ORDER — ONDANSETRON HCL 4 MG/2ML IJ SOLN
4.0000 mg | Freq: Once | INTRAMUSCULAR | Status: AC
  Administered 2018-10-17: 4 mg via INTRAVENOUS
  Filled 2018-10-17: qty 2

## 2018-10-17 MED ORDER — NITROGLYCERIN 0.4 MG SL SUBL: 0.4000 mg | SUBLINGUAL_TABLET | SUBLINGUAL | Status: DC | PRN

## 2018-10-17 MED ORDER — SODIUM CHLORIDE 0.9 % WEIGHT BASED INFUSION: 1.0000 mL/kg/h | INTRAVENOUS | Status: DC

## 2018-10-17 NOTE — Progress Notes (Signed)
Patient sitting comfortably, no significant pain at this time. COntinue prn morphine, plan for cath tomorrow   Zandra Abts MD

## 2018-10-17 NOTE — ED Notes (Signed)
Attempted report 

## 2018-10-17 NOTE — Progress Notes (Signed)
Pt last Trop 7.75.  Pt resting without c/o.  VSS.   IV heparin infusing per orders.  Dr. Alveta Heimlich notified via [place].

## 2018-10-17 NOTE — ED Triage Notes (Signed)
Pt here from home with chest pain and sob that started last night radiating top the left shoulder , pt took some asa and reflux meds but did not help the pain

## 2018-10-17 NOTE — Progress Notes (Signed)
Pt c/o chest pain 4/10.  Stated it started about one hour ago.  Administered morphine.  EKG done.  No changes noted from previous EKG.  Idolina Primer, RN

## 2018-10-17 NOTE — H&P (Signed)
Cardiology Admission History and Physical:   Patient ID: Eric Gordon MRN: [id number]; DOB: 07/09/1931   Admission date: 10/17/2018  Primary Care Provider: Shon Baton, MD Primary Cardiologist: Marigene Ehlers Primary Electrophysiologist:  na  Chief Complaint:  Chest pain  Patient Profile:   Eric Gordon is a 85 y.o. male with history of CAD with prior CABG presents with chest pain and SOB.   History of Present Illness:   Eric Gordon 85 yo male history of CAD with prior CABG in 2012, chronic DOE, chronotropic incompenetence on beta blockers, carotid stenosis, presenents with chest pain.  Reports symptoms started yesterday around 1130pm while sleeping. Sharp pain 10/10 midchest with some SOB. Constant pain through the nigh. +diaphoresis +SOB. Decided to come to ER this AM.     ER vitals: bp 148/69 p 50 100% RA K 3.9 Cr 1.05 WBC 10.6 Hgb 15.3 Plt 169  Trop 1.34--> CXR no acute process EKG sinus brady, bifascicular block (RBBB, LAFB), infeiror TWIs   Past Medical History:  Diagnosis Date  . Abdominal aortic aneurysm (AAA) ([place])   . Arthritis    "my whole body" (03/19/2018)  . CAD (coronary artery disease)   . Chronic anticoagulation    on coumadin for PE  . Chronic lower back pain   . GERD (gastroesophageal reflux disease)   . High cholesterol   . Hypertension   . Hypothyroidism   . OSA on CPAP   . Pneumonia    "now and once before" (03/19/2018)  . Pulmonary embolism [facility])    April 2012 after CABG  . S/P CABG (coronary artery bypass graft)     Past Surgical History:  Procedure Laterality Date  . CARDIAC CATHETERIZATION  11/2010  . CORONARY ARTERY BYPASS GRAFT  March 2012   CABG X 5  . INGUINAL HERNIA REPAIR Left 1980  . KNEE ARTHROSCOPY Left 2006  . PENILE PROSTHESIS IMPLANT    . RIGHT HEART CATHETERIZATION N/A 11/14/2013   Procedure: RIGHT HEART CATH;  Surgeon: Larey Dresser, MD;  Location: [facility] CATH LAB;  Service: Cardiovascular;  Laterality: N/A;    . SHOULDER OPEN ROTATOR CUFF REPAIR Right 2003     Medications Prior to Admission: Prior to Admission medications   Medication Sig Start Date End Date Taking? Authorizing Provider  Ascorbic Acid 500 MG CAPS Take 500 mg by mouth daily.    [provider]  aspirin 81 MG tablet Take 81 mg by mouth daily.      [provider]  atorvastatin (LIPITOR) 40 MG tablet Take 40 mg by mouth daily.    [provider]  cetirizine (ZYRTEC) 10 MG tablet Take 10 mg by mouth daily.      [provider]  cholecalciferol (VITAMIN D) 1000 UNITS tablet Take 1,000 Units by mouth daily.      [provider]  guaiFENesin (MUCINEX) 600 MG 12 hr tablet Take 1 tablet (600 mg total) by mouth 2 (two) times daily. 03/23/18   Bonnielee Haff, MD  levothyroxine (SYNTHROID, LEVOTHROID) 50 MCG tablet Take 50 mcg by mouth daily.    [provider]  losartan (COZAAR) 50 MG tablet TAKE ONE TABLET BY MOUTH ONCE DAILY Patient taking differently: TAKE ONE TABLET 50mg   BY MOUTH ONCE DAILY 11/11/17   Larey Dresser, MD  Multiple Vitamins-Minerals (CENTRUM SILVER PO) Take 1 tablet by mouth daily.     [provider]  pantoprazole (PROTONIX) 40 MG tablet Take 40 mg by mouth daily.  [provider]  tamsulosin (FLOMAX) 0.4 MG CAPS capsule Take 0.4 mg by mouth daily.    [provider]  traMADol (ULTRAM) 50 MG tablet Take 50 mg by mouth 3 (three) times daily as needed for moderate pain.     [provider]  vitamin E 400 UNIT capsule Take 400 Units by mouth daily.    [provider]     Allergies:    Allergies  Allergen Reactions  . Ancef [Cefazolin Sodium] Hives  . Vancomycin Rash    Social History:   Social History   Socioeconomic History  . Marital status: Married    Spouse name: Not on file  . Number of children: Not on file  . Years of education: Not on file  . Highest education level: Not on file  Occupational History   . Occupation: Scientist, clinical (histocompatibility and immunogenetics): [employer]  Social Needs  . Financial resource strain: Not on file  . Food insecurity:    Worry: Not on file    Inability: Not on file  . Transportation needs:    Medical: Not on file    Non-medical: Not on file  Tobacco Use  . Smoking status: Former Smoker    Packs/day: 1.00    Years: 35.00    Pack years: 35.00    Types: Cigarettes    Last attempt to quit: 02/18/1984    Years since quitting: 34.6  . Smokeless tobacco: Never Used  Substance and Sexual Activity  . Alcohol use: Yes    Alcohol/week: 2.0 standard drinks    Types: 2 Standard drinks or equivalent per week  . Drug use: Never  . Sexual activity: Not on file  Lifestyle  . Physical activity:    Days per week: Not on file    Minutes per session: Not on file  . Stress: Not on file  Relationships  . Social connections:    Talks on phone: Not on file    Gets together: Not on file    Attends religious service: Not on file    Active member of club or organization: Not on file    Attends meetings of clubs or organizations: Not on file    Relationship status: Not on file  . Intimate partner violence:    Fear of current or ex partner: Not on file    Emotionally abused: Not on file    Physically abused: Not on file    Forced sexual activity: Not on file  Other Topics Concern  . Not on file  Social History Narrative  . Not on file    Family History:   The patient's family history includes Coronary artery disease in his mother; Heart disease in his mother; Hypertension in his mother.    ROS:  Please see the history of present illness.  All other ROS reviewed and negative.     Physical Exam/Data:   Vitals:   10/17/18 0954 10/17/18 1000 10/17/18 1039 10/17/18 1045  BP:  138/67 132/64 130/65  Pulse:  (!) 55 (!) 48 (!) 43  Resp:  16 18 20   SpO2:  97% 99% 99%  Weight: 104.3 kg     Height: 6' 0.5" (1.842 m)      No intake or output data in the 24 hours ending 10/17/18 1113 Last 3  Weights 10/17/2018 03/19/2018 03/19/2018  Weight (lbs) 230 lb 219 lb 12.8 oz 233 lb  Weight (kg) 104.327 kg 99.7 kg 105.688 kg     Body mass  index is 30.76 kg/m.  General:  Well nourished, well developed, in no acute distress HEENT: normal Lymph: no adenopathy Neck: no JVD Cardiac:  normal S1, S2; RRR; no murmur Lungs:  clear to auscultation bilaterally, no wheezing, rhonchi or rales  Abd: soft, nontender, no hepatomegaly  Ext: no edema Musculoskeletal:  No deformities, BUE and BLE strength normal and equal Skin: warm and dry  Neuro:  CNs 2-12 intact, no focal abnormalities noted Psych:  Normal affect   Laboratory Data:  Chemistry Recent Labs  Lab 10/17/18 0957  NA 138  K 3.9  CL 101  CO2 26  GLUCOSE 140*  BUN 24*  CREATININE 1.05  CALCIUM 9.5  GFRNONAA >60  GFRAA >60  ANIONGAP 11    No results for input(s): PROT, ALBUMIN, AST, ALT, ALKPHOS, BILITOT in the last 168 hours. Hematology Recent Labs  Lab 10/17/18 0957  WBC 10.6*  RBC 5.03  HGB 15.3  HCT 48.6  MCV 96.6  MCH 30.4  MCHC 31.5  RDW 13.9  PLT 169   Cardiac EnzymesNo results for input(s): TROPONINI in the last 168 hours.  Recent Labs  Lab 10/17/18 1013  TROPIPOC 1.34*    BNPNo results for input(s): BNP, PROBNP in the last 168 hours.  DDimer No results for input(s): DDIMER in the last 168 hours.  Radiology/Studies:  Dg Chest Portable 1 View  Result Date: 10/17/2018 CLINICAL DATA:  Chest pain and left arm pain.  Nausea for 1 day EXAM: PORTABLE CHEST 1 VIEW COMPARISON:  03/19/2018 FINDINGS: Normal heart size. No pleural effusion or edema identified. No airspace opacities identified. Aortic atherosclerosis. No aneurysm. IMPRESSION: 1. No acute cardiopulmonary abnormalities. Electronically Signed   By: Kerby Moors M.D.   On: 10/17/2018 10:37    Assessment and Plan:   1. NSTEMI - history of CAD with prior CABG in 2012(LIMA-LAD, SVT-diag, sequential SVG-OM and distal LCX, SVG-RCA - trop up to 1.34,  has not peaked  - ongoing chest pain 8/10 per report though looks more comfortable than stated pain levels. Severe headache, flushed feeling, nausea with SL NG. Will avoid further use. - redose IV morphine 2mg , repeat stat troponin. Will need cath, his clinical course will determine timing whether today or tomorrow. If can get pain free would plan for tomorrow.   - medical therapy with ASA 81, atorva 40, losartan 50, hep gtt, no beta blocker due to bradycardia.  - plan for cath tomorrow AM.    2. Bradycardia - HRs at outpatient appts has been in low 50s - beta blocker stopped previously due to chronotropic incompetence according to clinic notes - baseline EKG with evidence of conduction disease with bifascicular block - avoid av nodal agents.    - overall I don't believe his bradycardia is acute, primarily chronic. Rates stable high 40s to low 50s with stable bp. Continue to monitor     For questions or updates, please contact [facility] Please consult [email or web address] for contact info under        Signed, Carlyle Dolly, MD  10/17/2018 11:13 AM

## 2018-10-17 NOTE — Progress Notes (Signed)
ANTICOAGULATION CONSULT NOTE - Initial Consult  Pharmacy Consult:  Heparin Indication: chest pain/ACS  Allergies  Allergen Reactions  . Ancef [Cefazolin Sodium] Hives  . Vancomycin Rash    Patient Measurements: Height: 6' 0.5" (184.2 cm) Weight: 230 lb (104.3 kg) IBW/kg (Calculated) : 78.75 Heparin Dosing Weight: 99 kg  Vital Signs: BP: 130/65 (01/26 1045) Pulse Rate: 43 (01/26 1045)  Labs: Recent Labs    10/17/18 0957  HGB 15.3  HCT 48.6  PLT 169  CREATININE 1.05    Estimated Creatinine Clearance: 64.7 mL/min (by C-G formula based on SCr of 1.05 mg/dL).   Medical History: Past Medical History:  Diagnosis Date  . Abdominal aortic aneurysm (AAA) ([place])   . Arthritis    "my whole body" (03/19/2018)  . CAD (coronary artery disease)   . Chronic anticoagulation    on coumadin for PE  . Chronic lower back pain   . GERD (gastroesophageal reflux disease)   . High cholesterol   . Hypertension   . Hypothyroidism   . OSA on CPAP   . Pneumonia    "now and once before" (03/19/2018)  . Pulmonary embolism [facility])    April 2012 after CABG  . S/P CABG (coronary artery bypass graft)     Assessment: 23 YOM presented with chest pain and SOB.  Troponin elevated at 1.34 and Pharmacy consulted to dose IV heparin for ACS.  Patient has a history of PE in 2012 and is no longer on anticoagulation.  No bleeding reported.  Goal of Therapy:  Heparin level 0.3-0.7 units/ml Monitor platelets by anticoagulation protocol: Yes   Plan:  Heparin 4000 units IV bolus, then Heparin gtt at 1400 units/hr Check 8 hr heparin level Daily heparin level and CBC   Farris Geiman D. Mina Marble, PharmD, BCPS, [place] 10/17/2018, 11:01 AM

## 2018-10-17 NOTE — ED Notes (Addendum)
Troponin of 2.42 REPORTED to primary RN and cardiology will be notified/ Dr. Harl Bowie paged and notified of elevated troponin

## 2018-10-17 NOTE — ED Provider Notes (Signed)
[facility] EMERGENCY DEPARTMENT Provider Note   CSN: [id number] Arrival date & time: 10/17/18  7564     History   Chief Complaint Chief Complaint  Patient presents with  . Chest Pain    HPI Eric Gordon is a 85 y.o. male.  The history is provided by the patient.  Chest Pain  Pain location:  L chest and substernal area Pain quality: pressure   Pain radiates to:  L arm Pain severity:  Severe Onset quality:  Gradual Timing:  Constant Progression:  Worsening Chronicity:  New Context: at rest   Relieved by:  Nothing Worsened by:  Nothing Associated symptoms: no abdominal pain, no altered mental status, no back pain, no cough, no dysphagia, no fever, no lower extremity edema, no orthopnea, no palpitations, no PND, no shortness of breath, no syncope and no vomiting   Risk factors: coronary artery disease, high cholesterol, hypertension and prior DVT/PE (post surgical, no longer on thinners)     Past Medical History:  Diagnosis Date  . Abdominal aortic aneurysm (AAA) ([place])   . Arthritis    "my whole body" (03/19/2018)  . CAD (coronary artery disease)   . Chronic anticoagulation    on coumadin for PE  . Chronic lower back pain   . GERD (gastroesophageal reflux disease)   . High cholesterol   . Hypertension   . Hypothyroidism   . OSA on CPAP   . Pneumonia    "now and once before" (03/19/2018)  . Pulmonary embolism [facility])    April 2012 after CABG  . S/P CABG (coronary artery bypass graft)     Patient Active Problem List   Diagnosis Date Noted  . CAP (community acquired pneumonia) 03/19/2018  . Hoarseness 12/15/2013  . Mild aortic stenosis 11/03/2012  . Atrial fibrillation ([place]) 04/02/2011  . Carotid stenosis, asymptomatic 04/02/2011  . Coronary artery disease 01/10/2011  . Abdominal aortic aneurysm ([place]) 11/06/2010  . CHEST TIGHTNESS-PRESSURE-OTHER 11/01/2010  . Asthma, mild intermittent 04/10/2010  . DYSPNEA 02/26/2010  . OBSTRUCTIVE  SLEEP APNEA 08/22/2008  . Essential hypertension 08/22/2008  . BARRETTS ESOPHAGUS 08/18/2008  . HIATAL HERNIA 08/18/2008  . DIVERTICULOSIS, COLON 08/18/2008  . COLONIC POLYPS, HX OF 08/18/2008    Past Surgical History:  Procedure Laterality Date  . CARDIAC CATHETERIZATION  11/2010  . CORONARY ARTERY BYPASS GRAFT  March 2012   CABG X 5  . INGUINAL HERNIA REPAIR Left 1980  . KNEE ARTHROSCOPY Left 2006  . PENILE PROSTHESIS IMPLANT    . RIGHT HEART CATHETERIZATION N/A 11/14/2013   Procedure: RIGHT HEART CATH;  Surgeon: Larey Dresser, MD;  Location: [facility] CATH LAB;  Service: Cardiovascular;  Laterality: N/A;  . SHOULDER OPEN ROTATOR CUFF REPAIR Right 2003        Home Medications    Prior to Admission medications   Medication Sig Start Date End Date Taking? Authorizing Provider  Ascorbic Acid 500 MG CAPS Take 500 mg by mouth daily.    [provider]  aspirin 81 MG tablet Take 81 mg by mouth daily.      [provider]  atorvastatin (LIPITOR) 40 MG tablet Take 40 mg by mouth daily.    [provider]  cetirizine (ZYRTEC) 10 MG tablet Take 10 mg by mouth daily.      [provider]  cholecalciferol (VITAMIN D) 1000 UNITS tablet Take 1,000 Units by mouth daily.      [provider]  guaiFENesin (MUCINEX) 600 MG 12 hr  tablet Take 1 tablet (600 mg total) by mouth 2 (two) times daily. 03/23/18   Bonnielee Haff, MD  levothyroxine (SYNTHROID, LEVOTHROID) 50 MCG tablet Take 50 mcg by mouth daily.    [provider]  losartan (COZAAR) 50 MG tablet TAKE ONE TABLET BY MOUTH ONCE DAILY Patient taking differently: TAKE ONE TABLET 50mg   BY MOUTH ONCE DAILY 11/11/17   Larey Dresser, MD  Multiple Vitamins-Minerals (CENTRUM SILVER PO) Take 1 tablet by mouth daily.     [provider]  pantoprazole (PROTONIX) 40 MG tablet Take 40 mg by mouth daily.     [provider]  tamsulosin (FLOMAX) 0.4 MG CAPS capsule Take 0.4 mg by mouth  daily.    [provider]  traMADol (ULTRAM) 50 MG tablet Take 50 mg by mouth 3 (three) times daily as needed for moderate pain.     [provider]  vitamin E 400 UNIT capsule Take 400 Units by mouth daily.    [provider]    Family History Family History  Problem Relation Age of Onset  . Coronary artery disease Mother   . Hypertension Mother   . Heart disease Mother     Social History Social History   Tobacco Use  . Smoking status: Former Smoker    Packs/day: 1.00    Years: 35.00    Pack years: 35.00    Types: Cigarettes    Last attempt to quit: 02/18/1984    Years since quitting: 34.6  . Smokeless tobacco: Never Used  Substance Use Topics  . Alcohol use: Yes    Alcohol/week: 2.0 standard drinks    Types: 2 Standard drinks or equivalent per week  . Drug use: Never     Allergies   Ancef [cefazolin sodium] and Vancomycin   Review of Systems Review of Systems  Constitutional: Negative for chills and fever.  HENT: Negative for ear pain, sore throat and trouble swallowing.   Eyes: Negative for pain and visual disturbance.  Respiratory: Negative for cough and shortness of breath.   Cardiovascular: Positive for chest pain. Negative for palpitations, orthopnea, syncope and PND.  Gastrointestinal: Negative for abdominal pain and vomiting.  Genitourinary: Negative for dysuria and hematuria.  Musculoskeletal: Negative for arthralgias and back pain.  Skin: Negative for color change and rash.  Neurological: Negative for seizures and syncope.  All other systems reviewed and are negative.    Physical Exam Updated Vital Signs  ED Triage Vitals  Enc Vitals Group     BP 10/17/18 0953 (!) 148/69     Pulse Rate 10/17/18 0953 (!) 50     Resp 10/17/18 0953 (!) 22     Temp --      Temp src --      SpO2 10/17/18 0953 100 %     Weight 10/17/18 0954 230 lb (104.3 kg)     Height 10/17/18 0954 6' 0.5" (1.842 m)     Head Circumference --      Peak  Flow --      Pain Score 10/17/18 0952 10     Pain Loc --      Pain Edu? --      Excl. in [place]? --     Physical Exam Vitals signs and nursing note reviewed.  Constitutional:      General: He is in acute distress.     Appearance: He is well-developed. He is ill-appearing and diaphoretic.  HENT:     Head: Normocephalic and atraumatic.  Eyes:     Conjunctiva/sclera: Conjunctivae normal.     Pupils: Pupils are equal, round, and reactive to light.  Neck:     Musculoskeletal: Normal range of motion and neck supple.  Cardiovascular:     Rate and Rhythm: Regular rhythm. Bradycardia present.     Pulses:          Radial pulses are 2+ on the right side and 2+ on the left side.       Dorsalis pedis pulses are 2+ on the right side and 2+ on the left side.     Heart sounds: Normal heart sounds. No murmur.  Pulmonary:     Effort: Pulmonary effort is normal. No respiratory distress.     Breath sounds: Normal breath sounds. No decreased breath sounds, wheezing, rhonchi or rales.  Abdominal:     Palpations: Abdomen is soft.     Tenderness: There is no abdominal tenderness.  Skin:    General: Skin is warm.     Capillary Refill: Capillary refill takes less than 2 seconds.  Neurological:     General: No focal deficit present.     Mental Status: He is alert.      ED Treatments / Results  Labs (all labs ordered are listed, but only abnormal results are displayed) Labs Reviewed  CBC WITH DIFFERENTIAL/PLATELET - Abnormal; Notable for the following components:      Result Value   WBC 10.6 (*)    Neutro Abs 8.7 (*)    All other components within normal limits  BASIC METABOLIC PANEL - Abnormal; Notable for the following components:   Glucose, Bld 140 (*)    BUN 24 (*)    All other components within normal limits  I-STAT TROPONIN, ED - Abnormal; Notable for the following components:   Troponin i, poc 1.34 (*)    All other components within normal limits  HEPARIN LEVEL (UNFRACTIONATED)     EKG EKG Interpretation  Date/Time:  Sunday October 17 2018 10:51:11 EST Ventricular Rate:  44 PR Interval:    QRS Duration: 174 QT Interval:  552 QTC Calculation: 473 R Axis:   -57 Text Interpretation:  Sinus bradycardia Ventricular premature complex RBBB and LAFB Confirmed by Lennice Sites [id number] on 10/17/2018 11:02:35 AM   Radiology Dg Chest Portable 1 View  Result Date: 10/17/2018 CLINICAL DATA:  Chest pain and left arm pain.  Nausea for 1 day EXAM: PORTABLE CHEST 1 VIEW COMPARISON:  03/19/2018 FINDINGS: Normal heart size. No pleural effusion or edema identified. No airspace opacities identified. Aortic atherosclerosis. No aneurysm. IMPRESSION: 1. No acute cardiopulmonary abnormalities. Electronically Signed   By: Kerby Moors M.D.   On: 10/17/2018 10:37    Procedures .Critical Care Performed by: Lennice Sites, DO Authorized by: Lennice Sites, DO   Critical care provider statement:    Critical care time (minutes):  45   Critical care time was exclusive of:  Separately billable procedures and treating other patients and teaching time   Critical care was necessary to treat or prevent imminent or life-threatening deterioration of the following conditions:  Cardiac failure   Critical care was time spent personally by me on the following activities:  Development of treatment plan with patient or surrogate, blood draw for specimens, discussions with primary provider, evaluation of patient's response to treatment, examination of patient, obtaining history from patient or surrogate, ordering and performing treatments and interventions, ordering and review of laboratory studies, ordering and review of radiographic studies, pulse oximetry, re-evaluation of patient's condition  and review of old charts   I assumed direction of critical care for this patient from another provider in my specialty: no     (including critical care time)  Medications Ordered in ED Medications  heparin  ADULT infusion 100 units/mL (25000 units/23mL sodium chloride 0.45%) (1,400 Units/hr Intravenous New Bag/Given 10/17/18 1114)  nitroGLYCERIN (NITROSTAT) SL tablet 0.4 mg (0.4 mg Sublingual Given 10/17/18 1016)  ondansetron (ZOFRAN) injection 4 mg (4 mg Intravenous Given 10/17/18 1041)  morphine 4 MG/ML injection 4 mg (4 mg Intravenous Given 10/17/18 1052)  heparin bolus via infusion 4,000 Units (4,000 Units Intravenous Bolus from Bag 10/17/18 1114)     Initial Impression / Assessment and Plan / ED Course  I have reviewed the triage vital signs and the nursing notes.  Pertinent labs & imaging results that were available during my care of the patient were reviewed by me and considered in my medical decision making (see chart for details).     Eric Gordon is an 85 year old male with history of CAD, hypertension who presents to the ED with chest pain.  Patient with bradycardia but otherwise unremarkable vitals.  Patient had EKG that showed sinus bradycardia.  No obvious ischemic changes or change from prior EKGs.  Patient states that about 10 hours ago he started to develop severe chest pain and now has progressed.  Starting to radiate down his left arm.  Feels nauseous and sweaty.  Had a heart bypass back in 2012.  Has not had any chest pain or other significant chest problems since.  He did have a PE after his surgery but is no longer on anticoagulation and has not been on any for several years.  Denies any shortness of breath.  Patient with no hypoxia.  No signs of volume overload on exam  Mostly concern for ACS.  Chest x-ray showed no signs of pneumonia, pneumothorax, pleural effusion.  Patient had troponin of 1.35.  Therefore Dr. Johnsie Cancel with cardiology was consulted and came down to the ED to evaluate the patient.  Patient started on IV heparin bolus and infusion for ACS.  Repeat EKGs were stable.  Patient was given nitroglycerin, morphine, Zofran with improvement of his pain.  Less likely PE at  this time.  No other significant anemia, electrolyte abnormality, kidney injury.  Patient to be admitted to cardiology service for further care.  Hemodynamically stable throughout my care.  This chart was dictated using voice recognition software.  Despite best efforts to proofread,  errors can occur which can change the documentation meaning.   Final Clinical Impressions(s) / ED Diagnoses   Final diagnoses:  NSTEMI (non-ST elevated myocardial infarction) [facility])    ED Discharge Orders    None       Lennice Sites, DO 10/17/18 1225

## 2018-10-17 NOTE — Progress Notes (Addendum)
Trop up to 2.4. Difficult to quantify patient's pain, he reports 5/10 pain but also reports his pain is "mild", much better than presentation. He has even dosed off after the morphine in the ER.  We discussed the 1-10 scale and that 5/10 would be considered moderate, but he does not quite seem to understand. Subjectively he appears comfortable, and continues to report his pain is mild at a 5/10 in severity    Overall reports mild chest pain, improved from presentation. Continue prn morphone, avoid NG given his prior side effects. Will check back on him later this afternoon to reassess. If significant ongoing chest pain would need to consider cath today. Right now he remains borderline for cath today vs tomorrow   Carlyle Dolly MD

## 2018-10-17 NOTE — H&P (View-Only) (Signed)
Patient sitting comfortably, no significant pain at this time. COntinue prn morphine, plan for cath tomorrow   Zandra Abts MD

## 2018-10-17 NOTE — ED Notes (Signed)
Zoll pads placed on pt.

## 2018-10-17 NOTE — Progress Notes (Signed)
ANTICOAGULATION CONSULT NOTE - Initial Consult  Pharmacy Consult:  Heparin Indication: chest pain/ACS  Allergies  Allergen Reactions  . Ancef [Cefazolin Sodium] Hives  . Vancomycin Rash    Patient Measurements: Height: 6\' 3"  (190.5 cm) Weight: 228 lb 6.4 oz (103.6 kg) IBW/kg (Calculated) : 84.5 Heparin Dosing Weight: 99 kg  Vital Signs: Temp: 98.4 F (36.9 C) (01/26 2032) Temp Source: Oral (01/26 2032) BP: 126/67 (01/26 2032) Pulse Rate: 52 (01/26 2032)  Labs: Recent Labs    10/17/18 0957 10/17/18 1157 10/17/18 1412 10/17/18 2007  HGB 15.3  --   --   --   HCT 48.6  --   --   --   PLT 169  --   --   --   HEPARINUNFRC  --   --   --  0.44  CREATININE 1.05  --   --   --   TROPONINI  --  2.42* 3.29*  --     Estimated Creatinine Clearance: 67 mL/min (by C-G formula based on SCr of 1.05 mg/dL).   Medical History: Past Medical History:  Diagnosis Date  . Abdominal aortic aneurysm (AAA) ([place])   . Arthritis    "my whole body" (03/19/2018)  . CAD (coronary artery disease)   . Chronic anticoagulation    on coumadin for PE  . Chronic lower back pain   . GERD (gastroesophageal reflux disease)   . High cholesterol   . Hypertension   . Hypothyroidism   . OSA on CPAP   . Pneumonia    "now and once before" (03/19/2018)  . Pulmonary embolism [facility])    April 2012 after CABG  . S/P CABG (coronary artery bypass graft)     Assessment: 6 YOM presented with chest pain and SOB.  Troponin elevated at 1.34 and Pharmacy consulted to dose IV heparin for ACS.  Patient has a history of PE in 2012 and is no longer on anticoagulation.  No bleeding reported.  First heparin level 0.44 (therapeutic). Heparin level with AM labs  Goal of Therapy:  Heparin level 0.3-0.7 units/ml Monitor platelets by anticoagulation protocol: Yes   Plan:  Continue Heparin gtt at 1400 units/hr Heparin level with AM labs Daily heparin level and CBC  Germain Koopmann A. Levada Dy, PharmD, [place] Pager: [phone] Please utilize Amion for appropriate phone number to reach the unit pharmacist ([place])   10/17/2018, 9:11 PM

## 2018-10-17 NOTE — ED Notes (Signed)
zoll pads placed on patient.

## 2018-10-18 ENCOUNTER — Encounter (HOSPITAL_COMMUNITY): Payer: Self-pay | Admitting: Cardiovascular Disease

## 2018-10-18 ENCOUNTER — Other Ambulatory Visit (HOSPITAL_COMMUNITY): Payer: Medicare Other

## 2018-10-18 ENCOUNTER — Encounter (HOSPITAL_COMMUNITY)
Admission: EM | Disposition: A | Discharge status: Home / Self Care | Payer: Self-pay | Source: Home / Self Care | Attending: Cardiology

## 2018-10-18 ENCOUNTER — Other Ambulatory Visit: Payer: Self-pay

## 2018-10-18 DIAGNOSIS — I361 Nonrheumatic tricuspid (valve) insufficiency: Secondary | ICD-10-CM | POA: Diagnosis not present

## 2018-10-18 DIAGNOSIS — E039 Hypothyroidism, unspecified: Secondary | ICD-10-CM | POA: Diagnosis not present

## 2018-10-18 DIAGNOSIS — Z881 Allergy status to other antibiotic agents status: Secondary | ICD-10-CM | POA: Diagnosis not present

## 2018-10-18 DIAGNOSIS — G8929 Other chronic pain: Secondary | ICD-10-CM | POA: Diagnosis not present

## 2018-10-18 DIAGNOSIS — I25810 Atherosclerosis of coronary artery bypass graft(s) without angina pectoris: Secondary | ICD-10-CM

## 2018-10-18 DIAGNOSIS — K219 Gastro-esophageal reflux disease without esophagitis: Secondary | ICD-10-CM | POA: Diagnosis not present

## 2018-10-18 DIAGNOSIS — E7800 Pure hypercholesterolemia, unspecified: Secondary | ICD-10-CM | POA: Diagnosis not present

## 2018-10-18 DIAGNOSIS — I714 Abdominal aortic aneurysm, without rupture: Secondary | ICD-10-CM | POA: Diagnosis not present

## 2018-10-18 DIAGNOSIS — I4589 Other specified conduction disorders: Secondary | ICD-10-CM | POA: Diagnosis not present

## 2018-10-18 DIAGNOSIS — I350 Nonrheumatic aortic (valve) stenosis: Secondary | ICD-10-CM | POA: Diagnosis present

## 2018-10-18 DIAGNOSIS — Z79890 Hormone replacement therapy (postmenopausal): Secondary | ICD-10-CM | POA: Diagnosis not present

## 2018-10-18 DIAGNOSIS — I452 Bifascicular block: Secondary | ICD-10-CM | POA: Diagnosis not present

## 2018-10-18 DIAGNOSIS — Z9989 Dependence on other enabling machines and devices: Secondary | ICD-10-CM | POA: Diagnosis not present

## 2018-10-18 DIAGNOSIS — I370 Nonrheumatic pulmonary valve stenosis: Secondary | ICD-10-CM | POA: Diagnosis not present

## 2018-10-18 DIAGNOSIS — Z79899 Other long term (current) drug therapy: Secondary | ICD-10-CM | POA: Diagnosis not present

## 2018-10-18 DIAGNOSIS — Z79891 Long term (current) use of opiate analgesic: Secondary | ICD-10-CM | POA: Diagnosis not present

## 2018-10-18 DIAGNOSIS — Z87891 Personal history of nicotine dependence: Secondary | ICD-10-CM | POA: Diagnosis not present

## 2018-10-18 DIAGNOSIS — Z86711 Personal history of pulmonary embolism: Secondary | ICD-10-CM | POA: Diagnosis not present

## 2018-10-18 DIAGNOSIS — I6529 Occlusion and stenosis of unspecified carotid artery: Secondary | ICD-10-CM | POA: Diagnosis present

## 2018-10-18 DIAGNOSIS — I2510 Atherosclerotic heart disease of native coronary artery without angina pectoris: Secondary | ICD-10-CM | POA: Diagnosis not present

## 2018-10-18 DIAGNOSIS — Z7982 Long term (current) use of aspirin: Secondary | ICD-10-CM | POA: Diagnosis not present

## 2018-10-18 DIAGNOSIS — R079 Chest pain, unspecified: Secondary | ICD-10-CM | POA: Diagnosis not present

## 2018-10-18 DIAGNOSIS — I214 Non-ST elevation (NSTEMI) myocardial infarction: Secondary | ICD-10-CM | POA: Diagnosis not present

## 2018-10-18 DIAGNOSIS — I5033 Acute on chronic diastolic (congestive) heart failure: Secondary | ICD-10-CM | POA: Diagnosis not present

## 2018-10-18 DIAGNOSIS — I5031 Acute diastolic (congestive) heart failure: Secondary | ICD-10-CM | POA: Diagnosis not present

## 2018-10-18 DIAGNOSIS — G4733 Obstructive sleep apnea (adult) (pediatric): Secondary | ICD-10-CM | POA: Diagnosis present

## 2018-10-18 DIAGNOSIS — I110 Hypertensive heart disease with heart failure: Secondary | ICD-10-CM | POA: Diagnosis not present

## 2018-10-18 DIAGNOSIS — E785 Hyperlipidemia, unspecified: Secondary | ICD-10-CM | POA: Diagnosis present

## 2018-10-18 HISTORY — PX: LEFT HEART CATH AND CORS/GRAFTS ANGIOGRAPHY: CATH118250

## 2018-10-18 LAB — BASIC METABOLIC PANEL
Anion gap: 6 (ref 5–15)
BUN: 20 mg/dL (ref 8–23)
CO2: 25 mmol/L (ref 22–32)
Calcium: 8.5 mg/dL — ABNORMAL LOW (ref 8.9–10.3)
Chloride: 106 mmol/L (ref 98–111)
Creatinine, Ser: 0.86 mg/dL (ref 0.61–1.24)
GFR calc Af Amer: >60 mL/min (ref >60)
GFR calc non Af Amer: >60 mL/min (ref >60)
Glucose, Bld: 125 mg/dL — ABNORMAL HIGH (ref 70–99)
Potassium: 3.8 mmol/L (ref 3.5–5.1)
Sodium: 137 mmol/L (ref 135–145)

## 2018-10-18 LAB — LIPID PANEL
Cholesterol: 98 mg/dL (ref 0–200)
HDL: 33 mg/dL — ABNORMAL LOW (ref >40)
LDL CALC: 52 mg/dL (ref 0–99)
Total CHOL/HDL Ratio: 3 RATIO
Triglycerides: 65 mg/dL (ref <150)
VLDL: 13 mg/dL (ref 0–40)

## 2018-10-18 LAB — CBC
HCT: 41.7 % (ref 39.0–52.0)
Hemoglobin: 13.4 g/dL (ref 13.0–17.0)
MCH: 31.1 pg (ref 26.0–34.0)
MCHC: 32.1 g/dL (ref 30.0–36.0)
MCV: 96.8 fL (ref 80.0–100.0)
Platelets: 138 10*3/uL — ABNORMAL LOW (ref 150–400)
RBC: 4.31 MIL/uL (ref 4.22–5.81)
RDW: 13.9 % (ref 11.5–15.5)
WBC: 11.9 10*3/uL — ABNORMAL HIGH (ref 4.0–10.5)
nRBC: 0 % (ref 0.0–0.2)

## 2018-10-18 LAB — TROPONIN I: Troponin I: 10.2 ng/mL (ref <0.03)

## 2018-10-18 LAB — HEPARIN LEVEL (UNFRACTIONATED): Heparin Unfractionated: 0.58 IU/mL (ref 0.30–0.70)

## 2018-10-18 SURGERY — LEFT HEART CATH AND CORS/GRAFTS ANGIOGRAPHY
Anesthesia: LOCAL

## 2018-10-18 MED ORDER — HEPARIN (PORCINE) IN NACL 1000-0.9 UT/500ML-% IV SOLN
INTRAVENOUS | Status: AC
  Filled 2018-10-18: qty 1000

## 2018-10-18 MED ORDER — SODIUM CHLORIDE 0.9 % IV SOLN: INTRAVENOUS | Status: AC

## 2018-10-18 MED ORDER — ACETAMINOPHEN 325 MG PO TABS: 650.0000 mg | ORAL_TABLET | ORAL | Status: DC | PRN

## 2018-10-18 MED ORDER — CLOPIDOGREL BISULFATE 75 MG PO TABS
75.0000 mg | ORAL_TABLET | Freq: Every day | ORAL | Status: DC
  Administered 2018-10-19: 75 mg via ORAL
  Filled 2018-10-18: qty 1

## 2018-10-18 MED ORDER — FENTANYL CITRATE (PF) 100 MCG/2ML IJ SOLN
INTRAMUSCULAR | Status: AC
  Filled 2018-10-18: qty 2

## 2018-10-18 MED ORDER — SODIUM CHLORIDE 0.9% FLUSH: 3.0000 mL | INTRAVENOUS | Status: DC | PRN

## 2018-10-18 MED ORDER — MIDAZOLAM HCL 2 MG/2ML IJ SOLN
INTRAMUSCULAR | Status: AC
  Filled 2018-10-18: qty 2

## 2018-10-18 MED ORDER — FENTANYL CITRATE (PF) 100 MCG/2ML IJ SOLN
INTRAMUSCULAR | Status: DC | PRN
  Administered 2018-10-18: 25 ug via INTRAVENOUS

## 2018-10-18 MED ORDER — LIDOCAINE HCL (PF) 1 % IJ SOLN
INTRAMUSCULAR | Status: DC | PRN
  Administered 2018-10-18: 15 mL

## 2018-10-18 MED ORDER — SODIUM CHLORIDE 0.9% FLUSH
3.0000 mL | Freq: Two times a day (BID) | INTRAVENOUS | Status: DC
  Administered 2018-10-18 – 2018-10-19 (×3): 3 mL via INTRAVENOUS

## 2018-10-18 MED ORDER — MELATONIN 3 MG PO TABS
3.0000 mg | ORAL_TABLET | Freq: Once | ORAL | Status: AC
  Administered 2018-10-19: 3 mg via ORAL
  Filled 2018-10-18: qty 1

## 2018-10-18 MED ORDER — CLOPIDOGREL BISULFATE 75 MG PO TABS
300.0000 mg | ORAL_TABLET | Freq: Once | ORAL | Status: AC
  Administered 2018-10-18: 300 mg via ORAL
  Filled 2018-10-18: qty 4

## 2018-10-18 MED ORDER — NITROGLYCERIN 0.4 MG SL SUBL
SUBLINGUAL_TABLET | SUBLINGUAL | Status: DC | PRN
  Administered 2018-10-18: .4 mg via SUBLINGUAL

## 2018-10-18 MED ORDER — LIDOCAINE HCL (PF) 1 % IJ SOLN
INTRAMUSCULAR | Status: AC
  Filled 2018-10-18: qty 30

## 2018-10-18 MED ORDER — MORPHINE SULFATE (PF) 2 MG/ML IV SOLN: 2.0000 mg | INTRAVENOUS | Status: DC | PRN

## 2018-10-18 MED ORDER — NITROGLYCERIN 0.4 MG SL SUBL
SUBLINGUAL_TABLET | SUBLINGUAL | Status: AC
  Filled 2018-10-18: qty 1

## 2018-10-18 MED ORDER — HEPARIN (PORCINE) IN NACL 1000-0.9 UT/500ML-% IV SOLN
INTRAVENOUS | Status: DC | PRN
  Administered 2018-10-18: 500 mL

## 2018-10-18 MED ORDER — ONDANSETRON HCL 4 MG/2ML IJ SOLN: 4.0000 mg | Freq: Four times a day (QID) | INTRAMUSCULAR | Status: DC | PRN

## 2018-10-18 MED ORDER — MIDAZOLAM HCL 2 MG/2ML IJ SOLN
INTRAMUSCULAR | Status: DC | PRN
  Administered 2018-10-18: 1 mg via INTRAVENOUS

## 2018-10-18 MED ORDER — SODIUM CHLORIDE 0.9 % IV SOLN: 250.0000 mL | INTRAVENOUS | Status: DC | PRN

## 2018-10-18 MED ORDER — IOHEXOL 350 MG/ML SOLN
INTRAVENOUS | Status: DC | PRN
  Administered 2018-10-18: 150 mL via INTRA_ARTERIAL

## 2018-10-18 MED ORDER — CLOPIDOGREL BISULFATE 300 MG PO TABS
300.0000 mg | ORAL_TABLET | Freq: Every day | ORAL | Status: DC
  Filled 2018-10-18: qty 1

## 2018-10-18 MED ORDER — CLOPIDOGREL BISULFATE 75 MG PO TABS: 75.0000 mg | ORAL_TABLET | Freq: Every day | ORAL | Status: DC

## 2018-10-18 MED ORDER — ASPIRIN 81 MG PO CHEW: 81.0000 mg | CHEWABLE_TABLET | Freq: Every day | ORAL | Status: DC

## 2018-10-18 MED ORDER — ISOSORBIDE MONONITRATE ER 30 MG PO TB24
30.0000 mg | ORAL_TABLET | Freq: Every day | ORAL | Status: DC
  Administered 2018-10-18 – 2018-10-19 (×2): 30 mg via ORAL
  Filled 2018-10-18 (×2): qty 1

## 2018-10-18 SURGICAL SUPPLY — 12 items
CATH INFINITI 5 FR LCB (CATHETERS) ×1 IMPLANT
CATH INFINITI 5 FR RCB (CATHETERS) ×1 IMPLANT
CATH INFINITI 5FR AL1 (CATHETERS) ×1 IMPLANT
CATH INFINITI 5FR JL5 (CATHETERS) ×1 IMPLANT
CATH INFINITI 5FR MULTPACK ANG (CATHETERS) ×1 IMPLANT
CLOSURE MYNX CONTROL 5F (Vascular Products) ×1 IMPLANT
KIT HEART LEFT (KITS) ×2 IMPLANT
PACK CARDIAC CATHETERIZATION (CUSTOM PROCEDURE TRAY) ×2 IMPLANT
SHEATH PINNACLE 5F 10CM (SHEATH) ×1 IMPLANT
TRANSDUCER W/STOPCOCK (MISCELLANEOUS) ×2 IMPLANT
WIRE EMERALD 3MM-J .035X150CM (WIRE) ×1 IMPLANT
WIRE HI TORQ VERSACORE-J 145CM (WIRE) ×1 IMPLANT

## 2018-10-18 NOTE — Care Management Obs Status (Signed)
[place] NOTIFICATION   Patient Details  Name: Eric Gordon MRN: [id number] Date of Birth: [birth year]   Medicare Observation Status Notification Given:  Yes    Erenest Rasher, RN 10/18/2018, 12:02 PM

## 2018-10-18 NOTE — Interval H&P Note (Signed)
Cath Lab Visit (complete for each Cath Lab visit)  Clinical Evaluation Leading to the Procedure:   ACS: Yes.    Non-ACS:    Anginal Classification: CCS III  Anti-ischemic medical therapy: Minimal Therapy (1 class of medications)  Non-Invasive Test Results: No non-invasive testing performed  Prior CABG: Previous CABG      History and Physical Interval Note:  10/18/2018 7:50 AM  [facility]  has presented today for surgery, with the diagnosis of NSTEMI  The various methods of treatment have been discussed with the patient and family. After consideration of risks, benefits and other options for treatment, the patient has consented to  Procedure(s): LEFT HEART CATH AND CORONARY ANGIOGRAPHY (N/A) as a surgical intervention .  The patient's history has been reviewed, patient examined, no change in status, stable for surgery.  I have reviewed the patient's chart and labs.  Questions were answered to the patient's satisfaction.     Quay Burow

## 2018-10-19 ENCOUNTER — Inpatient Hospital Stay (HOSPITAL_COMMUNITY): Payer: Medicare Other

## 2018-10-19 DIAGNOSIS — I370 Nonrheumatic pulmonary valve stenosis: Secondary | ICD-10-CM

## 2018-10-19 DIAGNOSIS — I5031 Acute diastolic (congestive) heart failure: Secondary | ICD-10-CM

## 2018-10-19 DIAGNOSIS — I361 Nonrheumatic tricuspid (valve) insufficiency: Secondary | ICD-10-CM

## 2018-10-19 LAB — CBC
HCT: 40.1 % (ref 39.0–52.0)
Hemoglobin: 12.7 g/dL — ABNORMAL LOW (ref 13.0–17.0)
MCH: 30.5 pg (ref 26.0–34.0)
MCHC: 31.7 g/dL (ref 30.0–36.0)
MCV: 96.2 fL (ref 80.0–100.0)
Platelets: 124 10*3/uL — ABNORMAL LOW (ref 150–400)
RBC: 4.17 MIL/uL — ABNORMAL LOW (ref 4.22–5.81)
RDW: 13.9 % (ref 11.5–15.5)
WBC: 13 10*3/uL — ABNORMAL HIGH (ref 4.0–10.5)
nRBC: 0 % (ref 0.0–0.2)

## 2018-10-19 LAB — BASIC METABOLIC PANEL
Anion gap: 9 (ref 5–15)
BUN: 17 mg/dL (ref 8–23)
CO2: 25 mmol/L (ref 22–32)
Calcium: 8.6 mg/dL — ABNORMAL LOW (ref 8.9–10.3)
Chloride: 102 mmol/L (ref 98–111)
Creatinine, Ser: 0.91 mg/dL (ref 0.61–1.24)
GFR calc Af Amer: >60 mL/min (ref >60)
GFR calc non Af Amer: >60 mL/min (ref >60)
Glucose, Bld: 112 mg/dL — ABNORMAL HIGH (ref 70–99)
Potassium: 3.6 mmol/L (ref 3.5–5.1)
Sodium: 136 mmol/L (ref 135–145)

## 2018-10-19 LAB — ECHOCARDIOGRAM COMPLETE
Height: 75 in
Weight: 3667.2 oz

## 2018-10-19 MED ORDER — POTASSIUM CHLORIDE ER 10 MEQ PO TBCR: 10.0000 meq | EXTENDED_RELEASE_TABLET | Freq: Every day | ORAL | 6 refills | Status: DC

## 2018-10-19 MED ORDER — FUROSEMIDE 20 MG PO TABS: 20.0000 mg | ORAL_TABLET | Freq: Every day | ORAL | 11 refills | Status: DC

## 2018-10-19 MED ORDER — FUROSEMIDE 10 MG/ML IJ SOLN
40.0000 mg | Freq: Once | INTRAMUSCULAR | Status: AC
  Administered 2018-10-19: 40 mg via INTRAVENOUS
  Filled 2018-10-19: qty 4

## 2018-10-19 MED ORDER — POTASSIUM CHLORIDE CRYS ER 20 MEQ PO TBCR
40.0000 meq | EXTENDED_RELEASE_TABLET | Freq: Once | ORAL | Status: AC
  Administered 2018-10-19: 40 meq via ORAL
  Filled 2018-10-19: qty 2

## 2018-10-19 MED ORDER — CLOPIDOGREL BISULFATE 75 MG PO TABS: 75.0000 mg | ORAL_TABLET | Freq: Every day | ORAL | 6 refills | Status: DC

## 2018-10-19 MED ORDER — ISOSORBIDE MONONITRATE ER 30 MG PO TB24: 30.0000 mg | ORAL_TABLET | Freq: Every day | ORAL | 6 refills | Status: DC

## 2018-10-19 MED ORDER — ATORVASTATIN CALCIUM 80 MG PO TABS
80.0000 mg | ORAL_TABLET | Freq: Every day | ORAL | Status: DC
  Administered 2018-10-19: 80 mg via ORAL
  Filled 2018-10-19: qty 1

## 2018-10-19 MED ORDER — LOSARTAN POTASSIUM 50 MG PO TABS
50.0000 mg | ORAL_TABLET | Freq: Every day | ORAL | Status: DC
  Administered 2018-10-19: 50 mg via ORAL
  Filled 2018-10-19: qty 1

## 2018-10-19 MED ORDER — RAMIPRIL 2.5 MG PO CAPS
2.5000 mg | ORAL_CAPSULE | Freq: Every day | ORAL | Status: DC
  Filled 2018-10-19: qty 1

## 2018-10-19 MED ORDER — PERFLUTREN LIPID MICROSPHERE
1.0000 mL | INTRAVENOUS | Status: AC | PRN
  Administered 2018-10-19: 2 mL via INTRAVENOUS
  Filled 2018-10-19: qty 10

## 2018-10-19 MED ORDER — ATORVASTATIN CALCIUM 80 MG PO TABS: 80.0000 mg | ORAL_TABLET | Freq: Every day | ORAL | 6 refills | Status: DC

## 2018-10-19 MED FILL — FUROSEMIDE 20 MG TAB: 20 | 30 days supply | Qty: 30 | Fill #0

## 2018-10-19 MED FILL — ATORVASTATIN CALCIUM 80 MG: 80 | 30 days supply | Qty: 30 | Fill #0

## 2018-10-19 MED FILL — ISOSORBIDE MN ER 30 MG TAB: 30 | 30 days supply | Qty: 30 | Fill #0

## 2018-10-19 MED FILL — POTASSIUM CHL ER M10 TABLET: 10 | 30 days supply | Qty: 30 | Fill #0

## 2018-10-19 MED FILL — CLOPIDOGREL 75 MG TABLET: 75 | 30 days supply | Qty: 30 | Fill #0

## 2018-10-19 NOTE — Progress Notes (Addendum)
Echocardiogram 2D Echocardiogram with Definity has been performed.  10/19/2018 11:27 AM Maudry Mayhew, MHA, RVT, RDCS, RDMS

## 2018-10-19 NOTE — Discharge Summary (Signed)
Advanced Heart Failure Team  Discharge Summary   Patient ID: Eric Gordon MRN: [id number], DOB/AGE: [id number] 83 y.o. Admit date: 10/17/2018 D/C date:     10/19/2018   Primary Discharge Diagnoses:  1. NSTEMI  2. A/C Diastolic HF  3. Hyperlipidemia  4. HTN 5. Aortic Stenosis. Mild on ECHO 2017. Repeat ECHO today.  6. 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   Admitted with chest pain. Had NSTEMI. Troponin peaked at 10.2. He underwent LHC as noted below. He will continue on asa, higher dose of statin, imdur, and plavix.    LVED was high so he received dose of IV lasix. He will be started on low dose lasix. He is not on a bb due to chronotropic incompetence. ECHO repeated, and pending at time of discharge.   1. NSTEMI  Known  CAD had CABG 3/12   LHC 10/18/2018   Mid RCA lesion is 50% stenosed.  Dist RCA lesion is 60% stenosed.  Ost Ramus lesion is 100% stenosed.  Ost 1st Mrg lesion is 95% stenosed.  Ost Cx to Prox Cx lesion is 50% stenosed.  Ost 1st Diag lesion is 70% stenosed.  Origin lesion is 100% stenosed.  Origin to Prox Graft lesion is 100% stenosed.  Prox Graft to Mid Graft lesion between Ramus and 3rd Mrg is 100% stenosed. - Medical management.  2. A/C Diastolic HF  - Last ECHO 0109. Echo repeated prior to discharge.  - Volume overloaded on cath and on exam. Give 40 mg IV lasix now + 40 meq potassium. 3. Hyperlipidemia  - Continue statin at higher dose 4. HTN - Stable.  5. Aortic Stenosis. Mild on ECHO 2017. Repeat echo pending.  6. AAA Had Abdominal AA 01/2018 4.1 cm   He will continue to be followed closely in the HF clinic and has follow up set on 11/01/2018. Problem based hospital course    Discharge Weight Range: 229.2 lbs Discharge Vitals: Blood pressure 120/66, pulse (!) 59,  temperature 98.4 F (36.9 C), temperature source Oral, resp. rate 10, height 6\' 3"  (1.905 m), weight 104 kg, SpO2 94 %.  Labs: Lab Results  Component Value Date   WBC 13.0 (H) 10/19/2018   HGB 12.7 (L) 10/19/2018   HCT 40.1 10/19/2018   MCV 96.2 10/19/2018   PLT 124 (L) 10/19/2018    Recent Labs  Lab 10/19/18 0337  NA 136  K 3.6  CL 102  CO2 25  BUN 17  CREATININE 0.91  CALCIUM 8.6*  GLUCOSE 112*   Lab Results  Component Value Date   CHOL 98 10/18/2018   HDL 33 (L) 10/18/2018   LDLCALC 52 10/18/2018   TRIG 65 10/18/2018   BNP (last 3 results) Recent Labs    03/19/18 1335  BNP 309.1*    ProBNP (last 3 results) No results for input(s): PROBNP in the last 8760 hours.  Diagnostic Studies/Procedures   LHC 10/18/2018   Mid RCA lesion is 50% stenosed.  Dist RCA lesion is 60% stenosed.  Ost Ramus lesion is 100% stenosed.  Ost 1st Mrg lesion is 95% stenosed.  Ost Cx to Prox Cx lesion is 50% stenosed.  Ost 1st Diag lesion is 70% stenosed.  Origin lesion is 100% stenosed.  Origin to Prox Graft lesion is 100% stenosed.  Prox Graft to Mid Graft lesion between Ramus  and 3rd Mrg is 100% stenosed.  LVEDP 31.   Discharge Medications   Allergies as of 10/19/2018      Reactions   Ancef [cefazolin Sodium] Hives   Vancomycin Rash      Medication List    STOP taking these medications   guaiFENesin 600 MG 12 hr tablet Commonly known as:  MUCINEX     TAKE these medications   acetaminophen 500 MG tablet Commonly known as:  TYLENOL Take 1,000 mg by mouth every 6 (six) hours as needed for mild pain.   Ascorbic Acid 500 MG Caps Take 500 mg by mouth daily.   aspirin 81 MG tablet Take 81 mg by mouth daily.   atorvastatin 80 MG tablet Commonly known as:  LIPITOR Take 1 tablet (80 mg total) by mouth daily. What changed:    medication strength  how much to take  when to take this   CENTRUM SILVER PO Take 1 tablet by mouth daily.   cetirizine 10  MG tablet Commonly known as:  ZYRTEC Take 10 mg by mouth daily.   cholecalciferol 1000 units tablet Commonly known as:  VITAMIN D Take 1,000 Units by mouth daily.   clopidogrel 75 MG tablet Commonly known as:  PLAVIX Take 1 tablet (75 mg total) by mouth daily. Start taking on:  October 20, 2018   furosemide 20 MG tablet Commonly known as:  LASIX Take 1 tablet (20 mg total) by mouth daily.   isosorbide mononitrate 30 MG 24 hr tablet Commonly known as:  IMDUR Take 1 tablet (30 mg total) by mouth daily. Start taking on:  October 20, 2018   levothyroxine 50 MCG tablet Commonly known as:  SYNTHROID, LEVOTHROID Take 50 mcg by mouth daily.   losartan 50 MG tablet Commonly known as:  COZAAR TAKE ONE TABLET BY MOUTH ONCE DAILY   pantoprazole 40 MG tablet Commonly known as:  PROTONIX Take 40 mg by mouth daily.   potassium chloride 10 MEQ tablet Commonly known as:  K-DUR Take 1 tablet (10 mEq total) by mouth daily.   tamsulosin 0.4 MG Caps capsule Commonly known as:  FLOMAX Take 0.4 mg by mouth daily after supper.   traMADol 50 MG tablet Commonly known as:  ULTRAM Take 50 mg by mouth 3 (three) times daily as needed for moderate pain.   triamcinolone cream 0.1 % Commonly known as:  KENALOG Apply 1 application topically daily as needed for rash.   vitamin E 400 UNIT capsule Take 400 Units by mouth daily.       Disposition   The patient will be discharged in stable condition to home.  Discharge Instructions    (HEART FAILURE PATIENTS) Call MD:  Anytime you have any of the following symptoms: 1) 3 pound weight gain in 24 hours or 5 pounds in 1 week 2) shortness of breath, with or without a dry hacking cough 3) swelling in the hands, feet or stomach 4) if you have to sleep on extra pillows at night in order to breathe.   Complete by:  As directed    (HEART FAILURE PATIENTS) Call MD:  Anytime you have any of the following symptoms: 1) 3 pound weight gain in 24 hours or 5  pounds in 1 week 2) shortness of breath, with or without a dry hacking cough 3) swelling in the hands, feet or stomach 4) if you have to sleep on extra pillows at night in order to breathe.   Complete by:  As directed  Diet - low sodium heart healthy   Complete by:  As directed    Diet - low sodium heart healthy   Complete by:  As directed    Increase activity slowly   Complete by:  As directed    Increase activity slowly   Complete by:  As directed    STOP any activity that causes chest pain, shortness of breath, dizziness, sweating, or exessive weakness   Complete by:  As directed      Follow-up Information    Larey Dresser, MD. Go on 11/01/2018.   Specialty:  Cardiology Why:  at 0920 in the [place] Failure clinic.  Gate code is [id number] for Feb.  Please bring all medications to appt. Contact information: [place] [address] [phone]             Duration of Discharge Encounter: Greater than 35 minutes   Signed, Shirley Friar, PA-C 10/19/2018, 1:39 PM

## 2018-10-19 NOTE — Progress Notes (Addendum)
Advanced Heart Failure Rounding Note  PCP-Cardiologist: No primary care provider on file.   Subjective:    S/P LHC 1/27.   Denies chest pain/shortness of breath. Wants to go home. Able to walk in the room.      LHC 10/18/2018   Mid RCA lesion is 50% stenosed.  Dist RCA lesion is 60% stenosed.  Ost Ramus lesion is 100% stenosed.  Ost 1st Mrg lesion is 95% stenosed.  Ost Cx to Prox Cx lesion is 50% stenosed.  Ost 1st Diag lesion is 70% stenosed.  Origin lesion is 100% stenosed.  Origin to Prox Graft lesion is 100% stenosed.  Prox Graft to Mid Graft lesion between Ramus and 3rd Mrg is 100% stenosed.  LVEDP 31.      Objective:   Weight Range: 104 kg Body mass index is 28.65 kg/m.   Vital Signs:   Temp:  [98.4 F (36.9 C)-98.7 F (37.1 C)] 98.4 F (36.9 C) (01/28 0551) Pulse Rate:  [53-59] 59 (01/28 0551) Resp:  [10] 10 (01/27 2050) BP: (120-135)/(63-74) 120/66 (01/28 0814) SpO2:  [94 %-96 %] 94 % (01/28 0551) Weight:  [104 kg] 104 kg (01/28 0551) Last BM Date: 10/17/18  Weight change: Filed Weights   10/17/18 1449 10/18/18 0550 10/19/18 0551  Weight: 103.6 kg 103.9 kg 104 kg    Intake/Output:   Intake/Output Summary (Last 24 hours) at 10/19/2018 0932 Last data filed at 10/19/2018 0816 Gross per 24 hour  Intake 489 ml  Output 300 ml  Net 189 ml      Physical Exam    General:  Well appearing. No resp difficulty HEENT: Normal Neck: Supple. JVP 9-10 . Carotids 2+ bilat; no bruits. No lymphadenopathy or thyromegaly appreciated. Cor: PMI nondisplaced. Regular rate & rhythm. No rubs, gallops or murmurs. Lungs: Clear Abdomen: Soft, nontender, nondistended. No hepatosplenomegaly. No bruits or masses. Good bowel sounds. Extremities: No cyanosis, clubbing, rash, edema Neuro: Alert & orientedx3, cranial nerves grossly intact. moves all 4 extremities w/o difficulty. Affect pleasant   Telemetry  SR/SB 50-60s   EKG    N/a   Labs     CBC Recent Labs    10/17/18 0957 10/18/18 0142 10/19/18 0337  WBC 10.6* 11.9* 13.0*  NEUTROABS 8.7*  --   --   HGB 15.3 13.4 12.7*  HCT 48.6 41.7 40.1  MCV 96.6 96.8 96.2  PLT 169 138* 440*   Basic Metabolic Panel Recent Labs    10/18/18 0142 10/19/18 0337  NA 137 136  K 3.8 3.6  CL 106 102  CO2 25 25  GLUCOSE 125* 112*  BUN 20 17  CREATININE 0.86 0.91  CALCIUM 8.5* 8.6*   Liver Function Tests No results for input(s): AST, ALT, ALKPHOS, BILITOT, PROT, ALBUMIN in the last 72 hours. No results for input(s): LIPASE, AMYLASE in the last 72 hours. Cardiac Enzymes Recent Labs    10/17/18 1412 10/17/18 2007 10/18/18 0142  TROPONINI 3.29* 7.75* 10.20*    BNP: BNP (last 3 results) Recent Labs    03/19/18 1335  BNP 309.1*    ProBNP (last 3 results) No results for input(s): PROBNP in the last 8760 hours.   D-Dimer No results for input(s): DDIMER in the last 72 hours. Hemoglobin A1C No results for input(s): HGBA1C in the last 72 hours. Fasting Lipid Panel Recent Labs    10/18/18 0142  CHOL 98  HDL 33*  LDLCALC 52  TRIG 65  CHOLHDL 3.0   Thyroid Function Tests No results  for input(s): TSH, T4TOTAL, T3FREE, THYROIDAB in the last 72 hours.  Invalid input(s): FREET3  Other results:   Imaging     No results found.   Medications:     Scheduled Medications: . aspirin EC  81 mg Oral Daily  . atorvastatin  40 mg Oral Daily  . clopidogrel  75 mg Oral Daily  . isosorbide mononitrate  30 mg Oral Daily  . levothyroxine  50 mcg Oral Daily  . loratadine  10 mg Oral Daily  . pantoprazole  40 mg Oral Daily  . sodium chloride flush  3 mL Intravenous Q12H  . tamsulosin  0.4 mg Oral Daily     Infusions: . sodium chloride       PRN Medications:  sodium chloride, acetaminophen, morphine injection, nitroGLYCERIN, ondansetron (ZOFRAN) IV, sodium chloride flush, traMADol    Patient Profile   Eric Gordon 85 yo male history of CAD with prior  CABG in 2012, chronic DOE, chronotropic incompenetence on beta blockers, and carotid stenosis.   Admitted with chest pain.   Assessment/Plan   1. NSTEMI  No chest pain.  Known  CAD had CABG 3/12   LHC 10/18/2018   Mid RCA lesion is 50% stenosed.  Dist RCA lesion is 60% stenosed.  Ost Ramus lesion is 100% stenosed.  Ost 1st Mrg lesion is 95% stenosed.  Ost Cx to Prox Cx lesion is 50% stenosed.  Ost 1st Diag lesion is 70% stenosed.  Origin lesion is 100% stenosed.  Origin to Prox Graft lesion is 100% stenosed.  Prox Graft to Mid Graft lesion between Ramus and 3rd Mrg is 100% stenosed. Continue aspirin, statin, and plavix.   2. A/C Diastolic HF  Last ECHO 9629. Repeat today.  Volume overloaded on cath and on exam. Give 40 mg IV lasix now + 40 meq potassium. Renal function stable.   3. Hyperlipidemia  Continue statin  4. HTN Stable.   5. Aortic Stenosis. Mild on ECHO 2017. Repeat ECHO today.   6. AAA Had Abdominal AA 01/2018 4.1 cm    Possible discharge today if he diureses well.   HF follow up requested.   Medication concerns reviewed with patient and pharmacy team. Barriers identified: none   Length of Stay: 1  Amy Clegg, NP  10/19/2018, 9:32 AM  Advanced Heart Failure Team Pager [phone] (M-F; 7a - 4p)  Please contact [facility] for night-coverage after hours (4p -7a ) and weekends on [email or web address]  Patient seen with NP, agree with the above note.   No chest pain today.  Has walked in room without dyspnea.   On exam, JVP 9-10 cm.  Clear lungs.  Regular S1S2 with 3/6 SEM RUSB and clear S2.   1. CAD: s/p NSTEMI, troponin peaked at 10.  Cath showed occluded SVG-RCA but good flow down native RCA, occluded SVG-D, occluded distal limb of LCx continuation graft.  The LCx graft occlusion was likely the culprit.  LIMA-LAD patent.  No interventional target.  No further chest pain.  - Increase atorvastatin to 80 mg daily.  - He was on losartan 50 mg daily at  home, will restart.  - Continue ASA 81 and continue Plavix 75 daily.  - No beta blocker with bradycardia.  - Imdur 30 daily.  2. Acute ?diastolic CHF: LVEDP 30 on cath, volume overload on exam today though he denies dyspnea.   - Needs echo today.  - Lasix 40 mg IV x 1 now with KCl 40.  - Would give Lasix 20  mg daily + KCl 10 daily at home until he sees me back.  3. Aortic stenosis: Mild on last echo.  Repeat today.  4. Suspect he can go home this afternoon after echo and IV Lasix.  He needs followup with me in 2 wks.  Meds for home: ASA 81 daily, Plavix 75 mg daily, atorvastatin 80 mg daily, Imdur 30 mg daily, losartan 50 mg daily, Lasix 20 mg daily, KCl 10 mEq daily.   [name] 10/19/2018 10:15 AM

## 2018-10-21 ENCOUNTER — Inpatient Hospital Stay (HOSPITAL_COMMUNITY)
Admission: EM | Disposition: A | Discharge status: Skilled Nursing Facility | Payer: Self-pay | Source: Home / Self Care | Attending: Cardiovascular Disease

## 2018-10-21 ENCOUNTER — Encounter (HOSPITAL_COMMUNITY): Payer: Self-pay | Admitting: Cardiovascular Disease

## 2018-10-21 ENCOUNTER — Other Ambulatory Visit: Payer: Self-pay

## 2018-10-21 ENCOUNTER — Emergency Department (HOSPITAL_COMMUNITY): Payer: Medicare Other

## 2018-10-21 ENCOUNTER — Inpatient Hospital Stay (HOSPITAL_COMMUNITY)
Admission: EM | Admit: 2018-10-21 | Discharge: 2018-10-28 | DRG: 280 | Disposition: A | Discharge status: Skilled Nursing Facility | Payer: Medicare Other | Attending: Cardiology | Admitting: Cardiology

## 2018-10-21 DIAGNOSIS — Z7982 Long term (current) use of aspirin: Secondary | ICD-10-CM | POA: Diagnosis not present

## 2018-10-21 DIAGNOSIS — R0789 Other chest pain: Secondary | ICD-10-CM | POA: Diagnosis not present

## 2018-10-21 DIAGNOSIS — I5031 Acute diastolic (congestive) heart failure: Secondary | ICD-10-CM | POA: Diagnosis not present

## 2018-10-21 DIAGNOSIS — I25119 Atherosclerotic heart disease of native coronary artery with unspecified angina pectoris: Secondary | ICD-10-CM | POA: Diagnosis present

## 2018-10-21 DIAGNOSIS — Z8249 Family history of ischemic heart disease and other diseases of the circulatory system: Secondary | ICD-10-CM

## 2018-10-21 DIAGNOSIS — Z111 Encounter for screening for respiratory tuberculosis: Secondary | ICD-10-CM | POA: Diagnosis not present

## 2018-10-21 DIAGNOSIS — Z79890 Hormone replacement therapy (postmenopausal): Secondary | ICD-10-CM | POA: Diagnosis not present

## 2018-10-21 DIAGNOSIS — I213 ST elevation (STEMI) myocardial infarction of unspecified site: Secondary | ICD-10-CM | POA: Diagnosis not present

## 2018-10-21 DIAGNOSIS — E039 Hypothyroidism, unspecified: Secondary | ICD-10-CM | POA: Diagnosis not present

## 2018-10-21 DIAGNOSIS — J9601 Acute respiratory failure with hypoxia: Secondary | ICD-10-CM | POA: Diagnosis not present

## 2018-10-21 DIAGNOSIS — I480 Paroxysmal atrial fibrillation: Secondary | ICD-10-CM | POA: Diagnosis not present

## 2018-10-21 DIAGNOSIS — N183 Chronic kidney disease, stage 3 (moderate): Secondary | ICD-10-CM | POA: Diagnosis present

## 2018-10-21 DIAGNOSIS — R262 Difficulty in walking, not elsewhere classified: Secondary | ICD-10-CM | POA: Diagnosis not present

## 2018-10-21 DIAGNOSIS — R52 Pain, unspecified: Secondary | ICD-10-CM | POA: Diagnosis not present

## 2018-10-21 DIAGNOSIS — I499 Cardiac arrhythmia, unspecified: Secondary | ICD-10-CM | POA: Diagnosis not present

## 2018-10-21 DIAGNOSIS — I2129 ST elevation (STEMI) myocardial infarction involving other sites: Secondary | ICD-10-CM

## 2018-10-21 DIAGNOSIS — I214 Non-ST elevation (NSTEMI) myocardial infarction: Secondary | ICD-10-CM | POA: Diagnosis not present

## 2018-10-21 DIAGNOSIS — Z881 Allergy status to other antibiotic agents status: Secondary | ICD-10-CM

## 2018-10-21 DIAGNOSIS — J209 Acute bronchitis, unspecified: Secondary | ICD-10-CM | POA: Diagnosis not present

## 2018-10-21 DIAGNOSIS — I2510 Atherosclerotic heart disease of native coronary artery without angina pectoris: Secondary | ICD-10-CM

## 2018-10-21 DIAGNOSIS — I4891 Unspecified atrial fibrillation: Secondary | ICD-10-CM | POA: Diagnosis not present

## 2018-10-21 DIAGNOSIS — I959 Hypotension, unspecified: Secondary | ICD-10-CM | POA: Diagnosis present

## 2018-10-21 DIAGNOSIS — E7800 Pure hypercholesterolemia, unspecified: Secondary | ICD-10-CM | POA: Diagnosis present

## 2018-10-21 DIAGNOSIS — I5033 Acute on chronic diastolic (congestive) heart failure: Secondary | ICD-10-CM | POA: Diagnosis present

## 2018-10-21 DIAGNOSIS — R279 Unspecified lack of coordination: Secondary | ICD-10-CM | POA: Diagnosis not present

## 2018-10-21 DIAGNOSIS — E569 Vitamin deficiency, unspecified: Secondary | ICD-10-CM | POA: Diagnosis not present

## 2018-10-21 DIAGNOSIS — I6529 Occlusion and stenosis of unspecified carotid artery: Secondary | ICD-10-CM | POA: Diagnosis present

## 2018-10-21 DIAGNOSIS — I10 Essential (primary) hypertension: Secondary | ICD-10-CM | POA: Diagnosis not present

## 2018-10-21 DIAGNOSIS — K219 Gastro-esophageal reflux disease without esophagitis: Secondary | ICD-10-CM | POA: Diagnosis not present

## 2018-10-21 DIAGNOSIS — M6281 Muscle weakness (generalized): Secondary | ICD-10-CM | POA: Diagnosis not present

## 2018-10-21 DIAGNOSIS — R0600 Dyspnea, unspecified: Secondary | ICD-10-CM

## 2018-10-21 DIAGNOSIS — Z86711 Personal history of pulmonary embolism: Secondary | ICD-10-CM

## 2018-10-21 DIAGNOSIS — R079 Chest pain, unspecified: Secondary | ICD-10-CM | POA: Diagnosis not present

## 2018-10-21 DIAGNOSIS — I25719 Atherosclerosis of autologous vein coronary artery bypass graft(s) with unspecified angina pectoris: Secondary | ICD-10-CM | POA: Diagnosis present

## 2018-10-21 DIAGNOSIS — K5900 Constipation, unspecified: Secondary | ICD-10-CM | POA: Diagnosis not present

## 2018-10-21 DIAGNOSIS — I130 Hypertensive heart and chronic kidney disease with heart failure and stage 1 through stage 4 chronic kidney disease, or unspecified chronic kidney disease: Secondary | ICD-10-CM | POA: Diagnosis present

## 2018-10-21 DIAGNOSIS — I350 Nonrheumatic aortic (valve) stenosis: Secondary | ICD-10-CM | POA: Diagnosis present

## 2018-10-21 DIAGNOSIS — J181 Lobar pneumonia, unspecified organism: Secondary | ICD-10-CM | POA: Diagnosis not present

## 2018-10-21 DIAGNOSIS — I712 Thoracic aortic aneurysm, without rupture: Secondary | ICD-10-CM | POA: Diagnosis not present

## 2018-10-21 DIAGNOSIS — E785 Hyperlipidemia, unspecified: Secondary | ICD-10-CM | POA: Diagnosis not present

## 2018-10-21 DIAGNOSIS — M25512 Pain in left shoulder: Secondary | ICD-10-CM | POA: Diagnosis not present

## 2018-10-21 DIAGNOSIS — I4510 Unspecified right bundle-branch block: Secondary | ICD-10-CM | POA: Diagnosis present

## 2018-10-21 DIAGNOSIS — Z87891 Personal history of nicotine dependence: Secondary | ICD-10-CM | POA: Diagnosis not present

## 2018-10-21 DIAGNOSIS — Z7902 Long term (current) use of antithrombotics/antiplatelets: Secondary | ICD-10-CM | POA: Diagnosis not present

## 2018-10-21 DIAGNOSIS — R0602 Shortness of breath: Secondary | ICD-10-CM | POA: Diagnosis not present

## 2018-10-21 DIAGNOSIS — Z743 Need for continuous supervision: Secondary | ICD-10-CM | POA: Diagnosis not present

## 2018-10-21 DIAGNOSIS — R5381 Other malaise: Secondary | ICD-10-CM | POA: Diagnosis not present

## 2018-10-21 DIAGNOSIS — R61 Generalized hyperhidrosis: Secondary | ICD-10-CM | POA: Diagnosis not present

## 2018-10-21 HISTORY — PX: LEFT HEART CATH AND CORS/GRAFTS ANGIOGRAPHY: CATH118250

## 2018-10-21 LAB — CBC WITH DIFFERENTIAL/PLATELET
ABS IMMATURE GRANULOCYTES: 0.08 10*3/uL — AB (ref 0.00–0.07)
Abs Immature Granulocytes: 0.07 10*3/uL (ref 0.00–0.07)
Basophils Absolute: 0.1 10*3/uL (ref 0.0–0.1)
Basophils Absolute: 0 10*3/uL (ref 0.0–0.1)
Basophils Relative: 1 %
Basophils Relative: 0 %
EOS ABS: 0.1 10*3/uL (ref 0.0–0.5)
Eosinophils Absolute: 0 10*3/uL (ref 0.0–0.5)
Eosinophils Relative: 1 %
Eosinophils Relative: 0 %
HCT: 44.5 % (ref 39.0–52.0)
HCT: 39.1 % (ref 39.0–52.0)
Hemoglobin: 14 g/dL (ref 13.0–17.0)
Hemoglobin: 12.4 g/dL — ABNORMAL LOW (ref 13.0–17.0)
Immature Granulocytes: 1 %
Immature Granulocytes: 1 %
LYMPHS PCT: 8 %
Lymphocytes Relative: 20 %
Lymphs Abs: 2.4 10*3/uL (ref 0.7–4.0)
Lymphs Abs: 1 10*3/uL (ref 0.7–4.0)
MCH: 30.8 pg (ref 26.0–34.0)
MCH: 30.6 pg (ref 26.0–34.0)
MCHC: 31.5 g/dL (ref 30.0–36.0)
MCHC: 31.7 g/dL (ref 30.0–36.0)
MCV: 97.8 fL (ref 80.0–100.0)
MCV: 96.5 fL (ref 80.0–100.0)
Monocytes Absolute: 1.4 10*3/uL — ABNORMAL HIGH (ref 0.1–1.0)
Monocytes Absolute: 1.6 10*3/uL — ABNORMAL HIGH (ref 0.1–1.0)
Monocytes Relative: 11 %
Monocytes Relative: 12 %
Neutro Abs: 8 10*3/uL — ABNORMAL HIGH (ref 1.7–7.7)
Neutro Abs: 11 10*3/uL — ABNORMAL HIGH (ref 1.7–7.7)
Neutrophils Relative %: 66 %
Neutrophils Relative %: 79 %
Platelets: 146 10*3/uL — ABNORMAL LOW (ref 150–400)
Platelets: 146 10*3/uL — ABNORMAL LOW (ref 150–400)
RBC: 4.55 MIL/uL (ref 4.22–5.81)
RBC: 4.05 MIL/uL — ABNORMAL LOW (ref 4.22–5.81)
RDW: 14 % (ref 11.5–15.5)
RDW: 14.2 % (ref 11.5–15.5)
WBC: 12.1 10*3/uL — AB (ref 4.0–10.5)
WBC: 13.8 10*3/uL — ABNORMAL HIGH (ref 4.0–10.5)
nRBC: 0 % (ref 0.0–0.2)
nRBC: 0 % (ref 0.0–0.2)

## 2018-10-21 LAB — POCT I-STAT EG7
Acid-Base Excess: 2 mmol/L (ref 0.0–2.0)
Bicarbonate: 25.6 mmol/L (ref 20.0–28.0)
Calcium, Ion: 1.13 mmol/L — ABNORMAL LOW (ref 1.15–1.40)
HCT: 45 % (ref 39.0–52.0)
Hemoglobin: 15.3 g/dL (ref 13.0–17.0)
O2 Saturation: 53 %
PCO2 VEN: 36.4 mmHg — AB (ref 44.0–60.0)
PO2 VEN: 27 mmHg — AB (ref 32.0–45.0)
Potassium: 3.8 mmol/L (ref 3.5–5.1)
Sodium: 139 mmol/L (ref 135–145)
TCO2: 27 mmol/L (ref 22–32)
pH, Ven: 7.455 — ABNORMAL HIGH (ref 7.250–7.430)

## 2018-10-21 LAB — LIPID PANEL
Cholesterol: 113 mg/dL (ref 0–200)
HDL: 35 mg/dL — ABNORMAL LOW (ref >40)
LDL Cholesterol: 62 mg/dL (ref 0–99)
Total CHOL/HDL Ratio: 3.2 RATIO
Triglycerides: 82 mg/dL (ref <150)
VLDL: 16 mg/dL (ref 0–40)

## 2018-10-21 LAB — BASIC METABOLIC PANEL
Anion gap: 13 (ref 5–15)
BUN: 27 mg/dL — ABNORMAL HIGH (ref 8–23)
CO2: 19 mmol/L — ABNORMAL LOW (ref 22–32)
Calcium: 8.6 mg/dL — ABNORMAL LOW (ref 8.9–10.3)
Chloride: 105 mmol/L (ref 98–111)
Creatinine, Ser: 1.36 mg/dL — ABNORMAL HIGH (ref 0.61–1.24)
GFR calc Af Amer: 55 mL/min — ABNORMAL LOW (ref >60)
GFR calc non Af Amer: 47 mL/min — ABNORMAL LOW (ref >60)
GLUCOSE: 111 mg/dL — AB (ref 70–99)
Potassium: 4.5 mmol/L (ref 3.5–5.1)
Sodium: 137 mmol/L (ref 135–145)

## 2018-10-21 LAB — COMPREHENSIVE METABOLIC PANEL
ALT: 26 U/L (ref 0–44)
AST: 49 U/L — ABNORMAL HIGH (ref 15–41)
Albumin: 3.3 g/dL — ABNORMAL LOW (ref 3.5–5.0)
Alkaline Phosphatase: 51 U/L (ref 38–126)
Anion gap: 12 (ref 5–15)
BUN: 20 mg/dL (ref 8–23)
CALCIUM: 9.2 mg/dL (ref 8.9–10.3)
CO2: 23 mmol/L (ref 22–32)
Chloride: 107 mmol/L (ref 98–111)
Creatinine, Ser: 1.47 mg/dL — ABNORMAL HIGH (ref 0.61–1.24)
GFR calc Af Amer: 50 mL/min — ABNORMAL LOW (ref >60)
GFR calc non Af Amer: 43 mL/min — ABNORMAL LOW (ref >60)
Glucose, Bld: 116 mg/dL — ABNORMAL HIGH (ref 70–99)
Potassium: 3.8 mmol/L (ref 3.5–5.1)
Sodium: 142 mmol/L (ref 135–145)
TOTAL PROTEIN: 6.7 g/dL (ref 6.5–8.1)
Total Bilirubin: 1 mg/dL (ref 0.3–1.2)

## 2018-10-21 LAB — PROTIME-INR
INR: 1.16
PROTHROMBIN TIME: 14.7 s (ref 11.4–15.2)

## 2018-10-21 LAB — TROPONIN I
Troponin I: 14.8 ng/mL (ref <0.03)
Troponin I: 13.5 ng/mL (ref <0.03)
Troponin I: 12.1 ng/mL (ref <0.03)
Troponin I: 13.23 ng/mL (ref <0.03)

## 2018-10-21 LAB — BRAIN NATRIURETIC PEPTIDE: B Natriuretic Peptide: 720.2 pg/mL — ABNORMAL HIGH (ref 0.0–100.0)

## 2018-10-21 LAB — I-STAT TROPONIN, ED: Troponin i, poc: 13.84 ng/mL (ref 0.00–0.08)

## 2018-10-21 LAB — APTT: aPTT: 27 seconds (ref 24–36)

## 2018-10-21 LAB — I-STAT CREATININE, ED: Creatinine, Ser: 1.4 mg/dL — ABNORMAL HIGH (ref 0.61–1.24)

## 2018-10-21 LAB — MRSA PCR SCREENING: MRSA by PCR: NEGATIVE

## 2018-10-21 SURGERY — LEFT HEART CATH AND CORS/GRAFTS ANGIOGRAPHY
Anesthesia: LOCAL

## 2018-10-21 MED ORDER — LIDOCAINE HCL (PF) 1 % IJ SOLN
INTRAMUSCULAR | Status: DC | PRN
  Administered 2018-10-21: 15 mL

## 2018-10-21 MED ORDER — HEPARIN (PORCINE) IN NACL 1000-0.9 UT/500ML-% IV SOLN
INTRAVENOUS | Status: DC | PRN
  Administered 2018-10-21 (×3): 500 mL

## 2018-10-21 MED ORDER — ATORVASTATIN CALCIUM 80 MG PO TABS
80.0000 mg | ORAL_TABLET | Freq: Every day | ORAL | Status: DC
  Administered 2018-10-21 – 2018-10-27 (×7): 80 mg via ORAL
  Filled 2018-10-21 (×7): qty 1

## 2018-10-21 MED ORDER — ONDANSETRON HCL 4 MG/2ML IJ SOLN: 4.0000 mg | Freq: Four times a day (QID) | INTRAMUSCULAR | Status: DC | PRN

## 2018-10-21 MED ORDER — FENTANYL CITRATE (PF) 100 MCG/2ML IJ SOLN
50.0000 ug | Freq: Once | INTRAMUSCULAR | Status: AC
  Administered 2018-10-21: 50 ug via INTRAVENOUS

## 2018-10-21 MED ORDER — POTASSIUM CHLORIDE CRYS ER 20 MEQ PO TBCR
40.0000 meq | EXTENDED_RELEASE_TABLET | Freq: Once | ORAL | Status: AC
  Administered 2018-10-21: 40 meq via ORAL
  Filled 2018-10-21: qty 2

## 2018-10-21 MED ORDER — MORPHINE SULFATE (PF) 2 MG/ML IV SOLN
1.0000 mg | INTRAVENOUS | Status: DC | PRN
  Administered 2018-10-21 – 2018-10-22 (×5): 2 mg via INTRAVENOUS
  Filled 2018-10-21 (×6): qty 1

## 2018-10-21 MED ORDER — NITROGLYCERIN IN D5W 200-5 MCG/ML-% IV SOLN
INTRAVENOUS | Status: AC
  Filled 2018-10-21: qty 250

## 2018-10-21 MED ORDER — IOHEXOL 350 MG/ML SOLN
INTRAVENOUS | Status: DC | PRN
  Administered 2018-10-21: 75 mL via INTRA_ARTERIAL

## 2018-10-21 MED ORDER — LIDOCAINE HCL (PF) 1 % IJ SOLN
INTRAMUSCULAR | Status: AC
  Filled 2018-10-21: qty 30

## 2018-10-21 MED ORDER — ONDANSETRON HCL 4 MG/2ML IJ SOLN
INTRAMUSCULAR | Status: AC
  Filled 2018-10-21: qty 2

## 2018-10-21 MED ORDER — MIDAZOLAM HCL 2 MG/2ML IJ SOLN
INTRAMUSCULAR | Status: DC | PRN
  Administered 2018-10-21: 1 mg via INTRAVENOUS

## 2018-10-21 MED ORDER — ASPIRIN 81 MG PO CHEW
81.0000 mg | CHEWABLE_TABLET | Freq: Every day | ORAL | Status: DC
  Administered 2018-10-21 – 2018-10-27 (×6): 81 mg via ORAL
  Filled 2018-10-21 (×7): qty 1

## 2018-10-21 MED ORDER — CARVEDILOL 3.125 MG PO TABS
3.1250 mg | ORAL_TABLET | Freq: Two times a day (BID) | ORAL | Status: DC
  Administered 2018-10-21: 3.125 mg via ORAL
  Filled 2018-10-21 (×2): qty 1

## 2018-10-21 MED ORDER — SODIUM CHLORIDE 0.9 % IV BOLUS
200.0000 mL | Freq: Once | INTRAVENOUS | Status: AC
  Administered 2018-10-21: 200 mL via INTRAVENOUS

## 2018-10-21 MED ORDER — HEPARIN SODIUM (PORCINE) 5000 UNIT/ML IJ SOLN
5000.0000 [IU] | Freq: Three times a day (TID) | INTRAMUSCULAR | Status: DC
  Administered 2018-10-21 – 2018-10-22 (×2): 5000 [IU] via SUBCUTANEOUS
  Filled 2018-10-21 (×2): qty 1

## 2018-10-21 MED ORDER — FENTANYL CITRATE (PF) 100 MCG/2ML IJ SOLN
INTRAMUSCULAR | Status: AC
  Filled 2018-10-21: qty 2

## 2018-10-21 MED ORDER — VERAPAMIL HCL 2.5 MG/ML IV SOLN
INTRAVENOUS | Status: AC
  Filled 2018-10-21: qty 2

## 2018-10-21 MED ORDER — ACETAMINOPHEN 325 MG PO TABS
650.0000 mg | ORAL_TABLET | ORAL | Status: DC | PRN
  Administered 2018-10-21 – 2018-10-25 (×7): 650 mg via ORAL
  Filled 2018-10-21 (×7): qty 2

## 2018-10-21 MED ORDER — SODIUM CHLORIDE 0.9 % IV SOLN
INTRAVENOUS | Status: DC
  Administered 2018-10-21: 07:00:00 via INTRAVENOUS

## 2018-10-21 MED ORDER — NITROGLYCERIN IN D5W 200-5 MCG/ML-% IV SOLN
INTRAVENOUS | Status: AC | PRN
  Administered 2018-10-21: 5 ug/min via INTRAVENOUS

## 2018-10-21 MED ORDER — RANOLAZINE ER 500 MG PO TB12
500.0000 mg | ORAL_TABLET | Freq: Two times a day (BID) | ORAL | Status: DC
  Administered 2018-10-21 – 2018-10-28 (×15): 500 mg via ORAL
  Filled 2018-10-21 (×15): qty 1

## 2018-10-21 MED ORDER — HEPARIN (PORCINE) IN NACL 1000-0.9 UT/500ML-% IV SOLN
INTRAVENOUS | Status: AC
  Filled 2018-10-21: qty 1000

## 2018-10-21 MED ORDER — SODIUM CHLORIDE 0.9% FLUSH: 3.0000 mL | INTRAVENOUS | Status: DC | PRN

## 2018-10-21 MED ORDER — CLOPIDOGREL BISULFATE 75 MG PO TABS
75.0000 mg | ORAL_TABLET | Freq: Every day | ORAL | Status: DC
  Administered 2018-10-21 – 2018-10-27 (×7): 75 mg via ORAL
  Filled 2018-10-21 (×8): qty 1

## 2018-10-21 MED ORDER — MIDAZOLAM HCL 2 MG/2ML IJ SOLN
INTRAMUSCULAR | Status: AC
  Filled 2018-10-21: qty 2

## 2018-10-21 MED ORDER — FUROSEMIDE 10 MG/ML IJ SOLN
40.0000 mg | Freq: Once | INTRAMUSCULAR | Status: AC
  Administered 2018-10-21: 40 mg via INTRAVENOUS
  Filled 2018-10-21: qty 4

## 2018-10-21 MED ORDER — SODIUM CHLORIDE 0.9% FLUSH
3.0000 mL | Freq: Two times a day (BID) | INTRAVENOUS | Status: DC
  Administered 2018-10-21 – 2018-10-27 (×10): 3 mL via INTRAVENOUS

## 2018-10-21 MED ORDER — ISOSORBIDE MONONITRATE ER 60 MG PO TB24
60.0000 mg | ORAL_TABLET | Freq: Every day | ORAL | Status: DC
  Administered 2018-10-21: 60 mg via ORAL
  Filled 2018-10-21: qty 1

## 2018-10-21 MED ORDER — SODIUM CHLORIDE 0.9 % IV SOLN
INTRAVENOUS | Status: DC
  Administered 2018-10-21: 10 mL/h via INTRAVENOUS
  Administered 2018-10-21: 09:00:00 via INTRAVENOUS

## 2018-10-21 MED ORDER — SODIUM CHLORIDE 0.9 % IV SOLN: 250.0000 mL | INTRAVENOUS | Status: DC | PRN

## 2018-10-21 MED ORDER — ONDANSETRON HCL 4 MG/2ML IJ SOLN
4.0000 mg | Freq: Once | INTRAMUSCULAR | Status: AC
  Administered 2018-10-21: 4 mg via INTRAVENOUS

## 2018-10-21 SURGICAL SUPPLY — 13 items
CATH INFINITI 5 FR IM (CATHETERS) ×1 IMPLANT
CATH INFINITI 5FR JL5 (CATHETERS) ×1 IMPLANT
CATH INFINITI 5FR MULTPACK ANG (CATHETERS) ×1 IMPLANT
GUIDEWIRE INQWIRE 1.5J.035X260 (WIRE) IMPLANT
INQWIRE 1.5J .035X260CM (WIRE) ×2
KIT ENCORE 26 ADVANTAGE (KITS) ×1 IMPLANT
KIT HEART LEFT (KITS) ×2 IMPLANT
PACK CARDIAC CATHETERIZATION (CUSTOM PROCEDURE TRAY) ×2 IMPLANT
SHEATH PINNACLE 5F 10CM (SHEATH) IMPLANT
SHEATH PINNACLE 6F 10CM (SHEATH) ×1 IMPLANT
TRANSDUCER W/STOPCOCK (MISCELLANEOUS) ×2 IMPLANT
TUBING CIL FLEX 10 FLL-RA (TUBING) ×2 IMPLANT
WIRE EMERALD 3MM-J .035X150CM (WIRE) ×1 IMPLANT

## 2018-10-21 NOTE — ED Provider Notes (Signed)
TIME SEEN: 5:34 AM  CHIEF COMPLAINT: Code STEMI  HPI: Patient is an 85 year old male with known history of CAD status post CABG, diastolic CHF, previous pulmonary embolus after his CABG who presents to the emergency department with EMS as a code STEMI.  Patient reports that chest pain woke him from sleep at 4 AM.  He describes it as a sharp pain in the left side of his chest that radiates into his left shoulder with shortness of breath and diaphoresis.  States he woke up in a "puddle".  States this feels worse than the chest pain he had when he was just admitted for an NSTEMI 10/17/18-10/19/18.  Per EMS, patient had oxygen saturations in the mid 80s.  Placed on oxygen prior to arrival.  Does not wear oxygen at home.  Blood pressure was initially 116/70 with EMS.  He was given 4 mg of morphine and his blood pressure dropped to 94/60.  Patient took full dose aspirin at home.  States he does feel slightly nauseated.  No vomiting.  No dizziness.  Patient reports that he was chest pain-free for 2 days after discharge for pain came back suddenly tonight.   Echo 10/19/18:   1. The left ventricle appears to be moderately increased in size, has normal wall thickness 55-60% ejection fraction Spectral Doppler shows pseudonormal pattern of diastolic filling.  2. Inferolateral wall not well visualized in all views but appears slightly hypokinetic as compared to prior.  3. Right ventricular systolic pressure is is mildly elevated.  4. The right ventricle has normal size and normal systolic function.  5. Mildly dilated left atrial size.  6. Mildly dilated right atrial size.  7. Mitral valve regurgitation is trivial by color flow Doppler.  8. Moderate mitral annular calcification.  9. The mitral valve normal in structure and function. 10. Normal tricuspid valve. 11. Tricuspid regurgitation mild-moderate. 12. Aortic valve normal. 13. Mild stenosis of the aortic valve. 14. There is moderate calcification of the  aortic valve. 15. There is mild thickening of the aortic valve. 16. Pulmonic valve regurgitation is mild to moderate is mild by color flow Doppler. 17. The inferior vena cava was dilated in size with <50% respiratory variablity. 18. No atrial level shunt   Cath 10/19/18:   Mid RCA lesion is 50% stenosed.  Dist RCA lesion is 60% stenosed.  Ost Ramus lesion is 100% stenosed.  Ost 1st Mrg lesion is 95% stenosed.  Ost Cx to Prox Cx lesion is 50% stenosed.  Ost 1st Diag lesion is 70% stenosed.  Origin lesion is 100% stenosed.  Origin to Prox Graft lesion is 100% stenosed.  Prox Graft to Mid Graft lesion between Ramus and 3rd Mrg is 100% stenosed.    ROS: See HPI Constitutional: no fever  Eyes: no drainage  ENT: no runny nose   Cardiovascular:  no chest pain  Resp: no SOB  GI: no vomiting GU: no dysuria Integumentary: no rash  Allergy: no hives  Musculoskeletal: no leg swelling  Neurological: no slurred speech ROS otherwise negative  PAST MEDICAL HISTORY/PAST SURGICAL HISTORY:  Past Medical History:  Diagnosis Date  . Abdominal aortic aneurysm (AAA) ([place])   . Arthritis    "my whole body" (03/19/2018)  . CAD (coronary artery disease)   . Chronic anticoagulation    on coumadin for PE  . Chronic lower back pain   . GERD (gastroesophageal reflux disease)   . High cholesterol   . Hypertension   . Hypothyroidism   . OSA  on CPAP   . Pneumonia    "now and once before" (03/19/2018)  . Pulmonary embolism [facility])    April 2012 after CABG  . S/P CABG (coronary artery bypass graft)     MEDICATIONS:  Prior to Admission medications   Medication Sig Start Date End Date Taking? Authorizing Provider  acetaminophen (TYLENOL) 500 MG tablet Take 1,000 mg by mouth every 6 (six) hours as needed for mild pain.    [provider]  Ascorbic Acid 500 MG CAPS Take 500 mg by mouth daily.    [provider]  aspirin 81 MG tablet Take 81 mg by mouth daily.      [provider]  atorvastatin (LIPITOR) 80 MG tablet Take 1 tablet (80 mg total) by mouth daily. 10/19/18   Shirley Friar, PA-C  cetirizine (ZYRTEC) 10 MG tablet Take 10 mg by mouth daily.      [provider]  cholecalciferol (VITAMIN D) 1000 UNITS tablet Take 1,000 Units by mouth daily.      [provider]  clopidogrel (PLAVIX) 75 MG tablet Take 1 tablet (75 mg total) by mouth daily. 10/20/18   Shirley Friar, PA-C  furosemide (LASIX) 20 MG tablet Take 1 tablet (20 mg total) by mouth daily. 10/19/18 10/19/19  Shirley Friar, PA-C  isosorbide mononitrate (IMDUR) 30 MG 24 hr tablet Take 1 tablet (30 mg total) by mouth daily. 10/20/18   Shirley Friar, PA-C  levothyroxine (SYNTHROID, LEVOTHROID) 50 MCG tablet Take 50 mcg by mouth daily.    [provider]  losartan (COZAAR) 50 MG tablet TAKE ONE TABLET BY MOUTH ONCE DAILY 11/11/17   Larey Dresser, MD  Multiple Vitamins-Minerals (CENTRUM SILVER PO) Take 1 tablet by mouth daily.     [provider]  pantoprazole (PROTONIX) 40 MG tablet Take 40 mg by mouth daily.     [provider]  potassium chloride (K-DUR) 10 MEQ tablet Take 1 tablet (10 mEq total) by mouth daily. 10/19/18   Shirley Friar, PA-C  tamsulosin (FLOMAX) 0.4 MG CAPS capsule Take 0.4 mg by mouth daily after supper.     [provider]  traMADol (ULTRAM) 50 MG tablet Take 50 mg by mouth 3 (three) times daily as needed for moderate pain.     [provider]  triamcinolone cream (KENALOG) 0.1 % Apply 1 application topically daily as needed for rash.    [provider]  vitamin E 400 UNIT capsule Take 400 Units by mouth daily.    [provider]    ALLERGIES:  Allergies  Allergen Reactions  . Ancef [Cefazolin Sodium] Hives  . Vancomycin Rash    SOCIAL HISTORY:  Social History   Tobacco Use  . Smoking status: Former Smoker    Packs/day: 1.00    Years: 35.00     Pack years: 35.00    Types: Cigarettes    Last attempt to quit: 02/18/1984    Years since quitting: 34.6  . Smokeless tobacco: Never Used  Substance Use Topics  . Alcohol use: Yes    Alcohol/week: 2.0 standard drinks    Types: 2 Standard drinks or equivalent per week    FAMILY HISTORY: Family History  Problem Relation Age of Onset  . Coronary artery disease Mother   . Hypertension Mother   . Heart disease Mother     EXAM: BP (!) 89/58   Pulse 79   Temp (!) 96.7 F (35.9 C) (Tympanic)   Resp Marland Kitchen)  40   Wt 106.9 kg   SpO2 97%   BMI 29.46 kg/m  CONSTITUTIONAL: Alert and oriented and responds appropriately to questions.  Elderly, pale, appears uncomfortable HEAD: Normocephalic EYES: Conjunctivae clear, pupils appear equal, EOMI ENT: normal nose; moist mucous membranes NECK: Supple, no meningismus, no nuchal rigidity, no LAD  CARD: RRR; S1 and S2 appreciated; no murmurs, no clicks, no rubs, no gallops RESP: Normal chest excursion without splinting or tachypnea; breath sounds equal bilaterally, patient does have some mild scattered expiratory wheezes, no rhonchi or rales, no hypoxia here in the ED, speaking full sentences, no respiratory distress ABD/GI: Normal bowel sounds; non-distended; soft, non-tender, no rebound, no guarding, no peritoneal signs, no hepatosplenomegaly BACK:  The back appears normal and is non-tender to palpation, there is no CVA tenderness EXT: Normal ROM in all joints; non-tender to palpation; no edema; normal capillary refill; no cyanosis, no calf tenderness or swelling, 2+ radial and DP pulses bilaterally    SKIN: Normal color for age and race; warm; no rash NEURO: Moves all extremities equally PSYCH: The patient's mood and manner are appropriate. Grooming and personal hygiene are appropriate.  MEDICAL DECISION MAKING: Patient presented to the ED with active chest pain as a Code STEMI.  Patient had a recent NSTEMI and had a cardiac catheterization on  January 28 which revealed significant disease diffusely.  Medical management recommended at that time and he was placed on Imdur, Plavix and statins.  Unable to tolerate beta-blockers.  Code STEMI initiated by EMS in route.  EKG is concerning for possible involvement in the inferior lateral leads but not significantly changed from previous EKG.  There was some additional slight elevation in V5 and V6 compared to previous EKGs as well as inferior Q waves and a right bundle branch block.  Patient was hypotensive in the emergency department after receiving morphine by EMS.  Patient given a liter of IV fluid in the ED and blood pressure improved.  Continued to have chest pain despite fentanyl in the ED.  Not candidate for further morphine or nitroglycerin given his hypotension.  Dr. Marletta Lor and Dr. Claiborne Billings with cardiology at bedside to evaluate patient.  I-STAT troponin was elevated at 13 but we are not sure where his troponin peaked several days ago as he was discharged before there was a downtrend in his troponin.  Decision was ultimately made to take patient again to the Cath Lab given his pain, diaphoresis at home, hypotension in the ED.   I reviewed all nursing notes, vitals, pertinent previous records, EKGs, lab and urine results, imaging (as available).       EKG Interpretation  Date/Time:  Thursday October 21 2018 05:11:00 EST Ventricular Rate:  86 PR Interval:    QRS Duration: 158 QT Interval:  422 QTC Calculation: 505 R Axis:   -100 Text Interpretation:  Sinus rhythm RBBB and LAFB Inferior infarct, acute (RCA) Lateral leads are also involved Probable RV involvement, suggest recording right precordial leads >>> Acute MI <<< Confirmed by Pryor Curia [phone]) on 10/21/2018 7:20:26 AM       EKG Interpretation  Date/Time:  Thursday October 21 2018 05:12:55 EST Ventricular Rate:  84 PR Interval:    QRS Duration: 156 QT Interval:  416 QTC Calculation: 492 R Axis:   -90 Text Interpretation:   Sinus rhythm Right bundle branch block Inferior infarct, acute (RCA) Lateral leads are also involved Probable RV involvement, suggest recording right precordial leads Baseline wander in lead(s) V3 >>> Acute MI <<<  Confirmed by Pryor Curia [phone]) on 10/21/2018 7:20:49 AM       EKG Interpretation  Date/Time:  Thursday October 21 2018 05:36:24 EST Ventricular Rate:  80 PR Interval:    QRS Duration: 160 QT Interval:  441 QTC Calculation: 509 R Axis:   -90 Text Interpretation:  Sinus rhythm RBBB and LAFB Inferior infarct, acute Lateral leads are also involved >>> Acute MI <<< Confirmed by Pryor Curia [phone]) on 10/21/2018 7:21:07 AM         CRITICAL CARE Performed by: Cyril Mourning Nakaya Mishkin   Total critical care time: 40 minutes  Critical care time was exclusive of separately billable procedures and treating other patients.  Critical care was necessary to treat or prevent imminent or life-threatening deterioration.  Critical care was time spent personally by me on the following activities: development of treatment plan with patient and/or surrogate as well as nursing, discussions with consultants, evaluation of patient's response to treatment, examination of patient, obtaining history from patient or surrogate, ordering and performing treatments and interventions, ordering and review of laboratory studies, ordering and review of radiographic studies, pulse oximetry and re-evaluation of patient's condition.        Christel Bai, Delice Bison, DO 10/21/18 [phone]

## 2018-10-21 NOTE — H&P (Addendum)
Cardiology Admission History and Physical:   Patient ID: Eric Gordon MRN: [id number]; DOB: Jun 22, 1931   Admission date: 10/21/2018  Primary Care Provider: Shon Baton, MD Primary Cardiologist: No primary care provider on file.  Primary Electrophysiologist:  None   Chief Complaint:  Chest pain  Patient Profile:   Eric Gordon is a 85 y.o. male with a history of CAD s/p CABG in 2012, chronic DOE, chronotropic incompenetence on beta blockers, carotid stenosis, who presents with chest pain.  History of Present Illness:   Briefly, the patient recently presented on 1/26 with chest pain and uptrending enzymes. He was taken to the cath lab on 1/27, where he was found to have occluded grafts x 3 with severe native 3 vessel disease. LVEDP was 30. Decision was made to pursue medical management and he was started on Imdur in addition to DAPT for anti-anginal therapy. Diuretics were also added to his medical regimen. Of note, his troponin trend during hospitalization was 2 -> 3 -> 7 - > 10.   Patient was asymptomatic on the day of discharge and the day after he returned home. This AM around 4, he awoke diaphoretic with 10/10+ substernal chest pain that was worse than his prior presentation. EMS was activated and concern was for inferolateral STEMI based on field EKGs.   Upon presentation to the ER, patient hypotensive to 90s/60, with 8/10 ongoing CP despite morphine. Recent cath films and EKG reviewed with Dr. Claiborne Billings. Decision made to proceed with LHC to ensure no changes from prior in light of presentation this morning.   Past Medical History:  Diagnosis Date  . Abdominal aortic aneurysm (AAA) ([place])   . Arthritis    "my whole body" (03/19/2018)  . CAD (coronary artery disease)   . Chronic anticoagulation    on coumadin for PE  . Chronic lower back pain   . GERD (gastroesophageal reflux disease)   . High cholesterol   . Hypertension   . Hypothyroidism   . OSA on CPAP   .  Pneumonia    "now and once before" (03/19/2018)  . Pulmonary embolism [facility])    April 2012 after CABG  . S/P CABG (coronary artery bypass graft)     Past Surgical History:  Procedure Laterality Date  . CARDIAC CATHETERIZATION  11/2010  . CORONARY ARTERY BYPASS GRAFT  March 2012   CABG X 5  . INGUINAL HERNIA REPAIR Left 1980  . KNEE ARTHROSCOPY Left 2006  . LEFT HEART CATH AND CORS/GRAFTS ANGIOGRAPHY N/A 10/18/2018   Procedure: LEFT HEART CATH AND CORS/GRAFTS ANGIOGRAPHY;  Surgeon: Lorretta Harp, MD;  Location: [place] CV LAB;  Service: Cardiovascular;  Laterality: N/A;  . PENILE PROSTHESIS IMPLANT    . RIGHT HEART CATHETERIZATION N/A 11/14/2013   Procedure: RIGHT HEART CATH;  Surgeon: Larey Dresser, MD;  Location: [facility] CATH LAB;  Service: Cardiovascular;  Laterality: N/A;  . SHOULDER OPEN ROTATOR CUFF REPAIR Right 2003     Medications Prior to Admission: Prior to Admission medications   Medication Sig Start Date End Date Taking? Authorizing Provider  acetaminophen (TYLENOL) 500 MG tablet Take 1,000 mg by mouth every 6 (six) hours as needed for mild pain.    [provider]  Ascorbic Acid 500 MG CAPS Take 500 mg by mouth daily.    [provider]  aspirin 81 MG tablet Take 81 mg by mouth daily.      [provider]  atorvastatin (LIPITOR) 80 MG tablet Take 1 tablet (  80 mg total) by mouth daily. 10/19/18   Shirley Friar, PA-C  cetirizine (ZYRTEC) 10 MG tablet Take 10 mg by mouth daily.      [provider]  cholecalciferol (VITAMIN D) 1000 UNITS tablet Take 1,000 Units by mouth daily.      [provider]  clopidogrel (PLAVIX) 75 MG tablet Take 1 tablet (75 mg total) by mouth daily. 10/20/18   Shirley Friar, PA-C  furosemide (LASIX) 20 MG tablet Take 1 tablet (20 mg total) by mouth daily. 10/19/18 10/19/19  Shirley Friar, PA-C  isosorbide mononitrate (IMDUR) 30 MG 24 hr tablet Take 1 tablet (30 mg total) by  mouth daily. 10/20/18   Shirley Friar, PA-C  levothyroxine (SYNTHROID, LEVOTHROID) 50 MCG tablet Take 50 mcg by mouth daily.    [provider]  losartan (COZAAR) 50 MG tablet TAKE ONE TABLET BY MOUTH ONCE DAILY 11/11/17   Larey Dresser, MD  Multiple Vitamins-Minerals (CENTRUM SILVER PO) Take 1 tablet by mouth daily.     [provider]  pantoprazole (PROTONIX) 40 MG tablet Take 40 mg by mouth daily.     [provider]  potassium chloride (K-DUR) 10 MEQ tablet Take 1 tablet (10 mEq total) by mouth daily. 10/19/18   Shirley Friar, PA-C  tamsulosin (FLOMAX) 0.4 MG CAPS capsule Take 0.4 mg by mouth daily after supper.     [provider]  traMADol (ULTRAM) 50 MG tablet Take 50 mg by mouth 3 (three) times daily as needed for moderate pain.     [provider]  triamcinolone cream (KENALOG) 0.1 % Apply 1 application topically daily as needed for rash.    [provider]  vitamin E 400 UNIT capsule Take 400 Units by mouth daily.    [provider]     Allergies:    Allergies  Allergen Reactions  . Ancef [Cefazolin Sodium] Hives  . Vancomycin Rash    Social History:   Social History   Socioeconomic History  . Marital status: Married    Spouse name: Not on file  . Number of children: Not on file  . Years of education: Not on file  . Highest education level: Not on file  Occupational History  . Occupation: Scientist, clinical (histocompatibility and immunogenetics): [employer]  Social Needs  . Financial resource strain: Not on file  . Food insecurity:    Worry: Not on file    Inability: Not on file  . Transportation needs:    Medical: Not on file    Non-medical: Not on file  Tobacco Use  . Smoking status: Former Smoker    Packs/day: 1.00    Years: 35.00    Pack years: 35.00    Types: Cigarettes    Last attempt to quit: 02/18/1984    Years since quitting: 34.6  . Smokeless tobacco: Never Used  Substance and Sexual Activity  . Alcohol use:  Yes    Alcohol/week: 2.0 standard drinks    Types: 2 Standard drinks or equivalent per week  . Drug use: Never  . Sexual activity: Not on file  Lifestyle  . Physical activity:    Days per week: Not on file    Minutes per session: Not on file  . Stress: Not on file  Relationships  . Social connections:    Talks on phone: Not on file    Gets together: Not on file    Attends religious service: Not on file  Active member of club or organization: Not on file    Attends meetings of clubs or organizations: Not on file    Relationship status: Not on file  . Intimate partner violence:    Fear of current or ex partner: Not on file    Emotionally abused: Not on file    Physically abused: Not on file    Forced sexual activity: Not on file  Other Topics Concern  . Not on file  Social History Narrative  . Not on file    Family History:   The patient's family history includes Coronary artery disease in his mother; Heart disease in his mother; Hypertension in his mother.    Review of Systems: [y] = yes, [ ]  = no     General: Weight gain [ ] ; Weight loss [ ] ; Anorexia [ ] ; Fatigue [ ] ; Fever [ ] ; Chills [ ] ; Weakness [ ]    Cardiac: Chest pain/pressure Blue.Reese ]; Resting SOB [ y]; Exertional SOB [ ] ; Orthopnea [ ] ; Pedal Edema [ ] ; Palpitations [ ] ; Syncope [ ] ; Presyncope [ ] ; Paroxysmal nocturnal dyspnea[ ]    Pulmonary: Cough [ ] ; Wheezing[ ] ; Hemoptysis[ ] ; Sputum [ ] ; Snoring [ ]    GI: Vomiting[ ] ; Dysphagia[ ] ; Melena[ ] ; Hematochezia [ ] ; Heartburn[ ] ; Abdominal pain [ ] ; Constipation [ ] ; Diarrhea [ ] ; BRBPR [ ]    GU: Hematuria[ ] ; Dysuria [ ] ; Nocturia[ ]    Vascular: Pain in legs with walking [ ] ; Pain in feet with lying flat [ ] ; Non-healing sores [ ] ; Stroke [ ] ; TIA [ ] ; Slurred speech [ ] ;   Neuro: Headaches[ ] ; Vertigo[ ] ; Seizures[ ] ; Paresthesias[ ] ;Blurred vision [ ] ; Diplopia [ ] ; Vision changes [ ]    Ortho/Skin: Arthritis [ ] ; Joint pain [ ] ; Muscle pain [ ] ; Joint  swelling [ ] ; Back Pain [ ] ; Rash [ ]    Psych: Depression[ ] ; Anxiety[ ]    Heme: Bleeding problems [ ] ; Clotting disorders [ ] ; Anemia [ ]    Endocrine: Diabetes [ ] ; Thyroid dysfunction[ ]   Physical Exam/Data:   Vitals:   10/21/18 0538 10/21/18 0539 10/21/18 0540 10/21/18 0542  BP: 107/63  (!) 106/92 100/69  Pulse: 77 78 79 82  Resp: 17 (!) 29 19 19   Temp:      TempSrc:      SpO2: 100% 100% 100% 97%  Weight:       No intake or output data in the 24 hours ending 10/21/18 0550 Filed Weights   10/21/18 0522  Weight: 106.9 kg   Body mass index is 29.46 kg/m.  General:  Well nourished, well developed, in moderate distress. Uncomfortable appearing.  HEENT: normal Lymph: no adenopathy Neck: no JVD Endocrine:  No thryomegaly Vascular: No carotid bruits; FA pulses 2+ bilaterally without bruits.  Cardiac:  normal S1, S2; RRR; 2/6 systolic murmur.  Lungs:  Bibasilar crackles appreciated.  Abd: soft, nontender, no hepatomegaly  Ext: no edeam Musculoskeletal:  No deformities, BUE and BLE strength normal and equal Skin: warm and dry  Neuro:  CNs 2-12 intact, no focal abnormalities noted Psych:  Normal affect   EKG:  The ECG that was done and revealed RBBB with LAFB (unchanged) with ?subtle ST changes in inferolateral leads. Largely similar from prior.   Relevant CV Studies: TTE 10/19/18: 1. The left ventricle appears to be moderately increased in size, has normal wall thickness 55-60% ejection fraction Spectral Doppler shows pseudonormal pattern of diastolic filling.  2. Inferolateral wall not  well visualized in all views but appears slightly hypokinetic as compared to prior.  3. Right ventricular systolic pressure is is mildly elevated.  4. The right ventricle has normal size and normal systolic function.  5. Mildly dilated left atrial size.  6. Mildly dilated right atrial size.  7. Mitral valve regurgitation is trivial by color flow Doppler.  8. Moderate mitral annular  calcification.  9. The mitral valve normal in structure and function. 10. Normal tricuspid valve. 11. Tricuspid regurgitation mild-moderate. 12. Aortic valve normal. 13. Mild stenosis of the aortic valve. 14. There is moderate calcification of the aortic valve. 15. There is mild thickening of the aortic valve. 16. Pulmonic valve regurgitation is mild to moderate is mild by color flow Doppler. 17. The inferior vena cava was dilated in size with <50% respiratory variablity. 18. No atrial level shunt detected by color flow Doppler.  Laboratory Data:  Chemistry Recent Labs  Lab 10/18/18 0142 10/19/18 0337 10/21/18 0531  NA 137 136 139  K 3.8 3.6 3.8  CL 106 102  --   CO2 25 25  --   GLUCOSE 125* 112*  --   BUN 20 17  --   CREATININE 0.86 0.91 1.40*  CALCIUM 8.5* 8.6*  --   GFRNONAA >60 >60  --   GFRAA >60 >60  --   ANIONGAP 6 9  --     No results for input(s): PROT, ALBUMIN, AST, ALT, ALKPHOS, BILITOT in the last 168 hours. Hematology Recent Labs  Lab 10/18/18 0142 10/19/18 0337 10/21/18 0531  WBC 11.9* 13.0*  --   RBC 4.31 4.17*  --   HGB 13.4 12.7* 15.3  HCT 41.7 40.1 45.0  MCV 96.8 96.2  --   MCH 31.1 30.5  --   MCHC 32.1 31.7  --   RDW 13.9 13.9  --   PLT 138* 124*  --    Cardiac Enzymes Recent Labs  Lab 10/17/18 1412 10/17/18 2007 10/18/18 0142  TROPONINI 3.29* 7.75* 10.20*    Recent Labs  Lab 10/17/18 1013 10/21/18 0520  TROPIPOC 1.34* 13.84*    BNPNo results for input(s): BNP, PROBNP in the last 168 hours.  DDimer No results for input(s): DDIMER in the last 168 hours.  Radiology/Studies:  No results found.  Assessment and Plan:   85 year old man with recent NSTEMI and LHC notable for occluded grafts and severe 3V native disease who presents with a recurrent episode of SSCP.   To cath lab with Dr. Claiborne Billings. Full A/P to follow.   Severity of Illness: The appropriate patient status for this patient is INPATIENT. Inpatient status is judged to be  reasonable and necessary in order to provide the required intensity of service to ensure the patient's safety. The patient's presenting symptoms, physical exam findings, and initial radiographic and laboratory data in the context of their chronic comorbidities is felt to place them at high risk for further clinical deterioration. Furthermore, it is not anticipated that the patient will be medically stable for discharge from the hospital within 2 midnights of admission. The following factors support the patient status of inpatient.   " The patient's presenting symptoms include CP " The worrisome physical exam findings include hypotension, hypoxia.  " The initial radiographic and laboratory data are worrisome because of NSTEMI " The chronic co-morbidities include HTN   * I certify that at the point of admission it is my clinical judgment that the patient will require inpatient hospital care spanning beyond 2  midnights from the point of admission due to high intensity of service, high risk for further deterioration and high frequency of surveillance required.*    For questions or updates, please contact [facility] Please consult [email or web address] for contact info under     Signed, Milus Banister, MD  10/21/2018 5:50 AM    Patient seen and examined. Agree with assessment and plan.  Eric Gordon is an 85 year old gentleman who has undergone prior CABG revascularization surgery in 2012 and has a history of chronotropic incompetence, carotid stenosis, and recently underwent cardiac catheterization in the setting of a non-ST segment elevation MI on October 18, 2018 by Dr. Alvester Chou.  I have thoroughly reviewed his cardiac catheterization report and angiographic images.  Of note, troponin during his hospitalization was 2 and on the following day increased to 10.  Catheterization instructed moderate RCA disease, complex disease, and he was found to have an occluded graft to his RCA, and occluded graft  to his diagonal, and an occluded distal limb of a sequential Kaleab graft that had supplied to marginal vessels.  Medical therapy was recommended.  The following day he was pain-free.  However troponin had increased to 10.  He was discharged on Imdur in addition to aspirin and Plavix and high potency statin therapy with atorvastatin 80 mg.  He was without chest pain until this morning when he awakened from sleep at 4 AM with very severe chest pain similar to the chest pain leading to his recent hospitalization.  He also became significantly diaphoretic.  A code STEMI was activated.  His ECG did not show major change from several days ago although there was slight additional ST elevation in V5 V6 compared to prior ECG which also showed inferior Q waves and right bundle branch block.  Initially I was not planning to take the patient to the catheterization laboratory but due to his significant chest pain with hypotension on presentation and worrisome story it was felt repeat catheterization may be helpful to make sure there was no major change from a study of 3 days previously.  Troponin today was 13 which could represent a downward trend if his troponin had further increased since his discharge versus mild additional elevation.  The patient was advised of options and agreed to proceed for definitive reevaluation.   Troy Sine, MD, [facility] 10/21/2018 6:36 AM

## 2018-10-21 NOTE — ED Triage Notes (Signed)
Seen here Sat for MI, having chest pain again.

## 2018-10-21 NOTE — Plan of Care (Signed)
  Problem: Education: Goal: Understanding of cardiac disease, CV risk reduction, and recovery process will improve Outcome: Progressing Goal: Understanding of medication regimen will improve Outcome: Progressing   Problem: Activity: Goal: Ability to tolerate increased activity will improve Outcome: Not Progressing   Problem: Cardiac: Goal: Vascular access site(s) Level 0-1 will be maintained Outcome: Progressing

## 2018-10-21 NOTE — Progress Notes (Signed)
CTSP due to ongoing CP.  Patient had CP last night and taken to the cath lab this am with no change from anatomy from cath earlier this week.  Plan was for medical management.  Still had residual pain after cath in [place].  His CP has waxed and waned throughout the day but nurse concerned that SBP now in the low 80's.  Patient appears comfortable in bed with persistent 5/10 pain but he thinks it is better than earlier.  2D echo showed Normal LVF with EF 60%.  Given IV lasix this am and also on carvedilol 3.125mg  and Imdur 60mg .    GEN: Well nourished, well developed in no acute distress HEENT: Normal NECK: No JVD; No carotid bruits LYMPHATICS: No lymphadenopathy CARDIAC:RRR, no murmurs, rubs, gallops RESPIRATORY:  Clear to auscultation without rales, wheezing or rhonchi  ABDOMEN: Soft, non-tender, non-distended MUSCULOSKELETAL:  No edema; No deformity  SKIN: Warm and dry.  Right groin cath site clean and dry with no hematoma and 1+ distal pulses. NEUROLOGIC:  Alert and oriented x 3 PSYCHIATRIC:  Normal affect   He says that he does not feel as bad as earlier and has 5/10 pain into left shoulder.  SBP in the mid 80's and lungs are clear with no LE edema.  He got 40mg  IV Lasix this am and I&Os are incomplete.  Will give a saline bolus of 200cc.  Repeat EKG to make sure no acuate ST changes from baseline this am and continue to cycle trop.  If BP improves then use morphine for pain.

## 2018-10-21 NOTE — Progress Notes (Addendum)
Patient ID: Eric Gordon, male   DOB: [birth year], 85 y.o.   MRN: [id number]     Advanced Heart Failure Rounding Note  PCP-Cardiologist: No primary care provider on file.   Subjective:    Patient re-developed chest pain (10/10) last night, ECG with ST/T changes compared to prior (baseline RBBB).  Taken to cath lab this morning, no significant change in coronary anatomy compared to prior cath this week. He is still have some chest pain radiating to his shoulder.   Echo: EF 55-60%, inferolateral hypokinesis, normal RV, mild AS.   Cath: LIMA-LAD patent, SVG-D TO (old), SVG-PDA TO (old), sequential SVG-ramus and distal LCx with occluded limb to distal LCx (this was thought to be the culprit for NSTEMI earlier this week), 60-70% mRCA + 70% dRCA, 50% ostial LCx, 100% ostial ramus, LVEDP 20.    Objective:   Weight Range: 106.9 kg Body mass index is 29.46 kg/m.   Vital Signs:   Temp:  [96.7 F (35.9 C)] 96.7 F (35.9 C) (01/30 0511) Pulse Rate:  [76-94] 85 (01/30 0646) Resp:  [16-46] 18 (01/30 0646) BP: (85-166)/(52-138) 116/69 (01/30 0646) SpO2:  [91 %-100 %] 97 % (01/30 0646) Weight:  [106.9 kg] 106.9 kg (01/30 0522)    Weight change: Filed Weights   10/21/18 0522  Weight: 106.9 kg    Intake/Output:  No intake or output data in the 24 hours ending 10/21/18 4098    Physical Exam    General:  Well appearing. No resp difficulty HEENT: Normal Neck: Supple. JVP 8-9 cm. Carotids 2+ bilat; no bruits. No lymphadenopathy or thyromegaly appreciated. Cor: PMI nondisplaced. Regular rate & rhythm. No rubs, gallops or murmurs. Lungs: Clear Abdomen: Soft, nontender, nondistended. No hepatosplenomegaly. No bruits or masses. Good bowel sounds. Extremities: No cyanosis, clubbing, rash, edema Neuro: Alert & orientedx3, cranial nerves grossly intact. moves all 4 extremities w/o difficulty. Affect pleasant   Telemetry   NSR occasional PVC (personally reviewed)  EKG    NSR,  RBBB, some ST-T changes compared to prior (personally reviewed).   Labs    CBC Recent Labs    10/19/18 0337 10/21/18 0520 10/21/18 0531  WBC 13.0* 12.1*  --   NEUTROABS  --  8.0*  --   HGB 12.7* 14.0 15.3  HCT 40.1 44.5 45.0  MCV 96.2 97.8  --   PLT 124* 146*  --    Basic Metabolic Panel Recent Labs    10/19/18 0337 10/21/18 0520 10/21/18 0531  NA 136 142 139  K 3.6 3.8 3.8  CL 102 107  --   CO2 25 23  --   GLUCOSE 112* 116*  --   BUN 17 20  --   CREATININE 0.91 1.47* 1.40*  CALCIUM 8.6* 9.2  --    Liver Function Tests Recent Labs    10/21/18 0520  AST 49*  ALT 26  ALKPHOS 51  BILITOT 1.0  PROT 6.7  ALBUMIN 3.3*   No results for input(s): LIPASE, AMYLASE in the last 72 hours. Cardiac Enzymes Recent Labs    10/21/18 0520  TROPONINI 14.80*    BNP: BNP (last 3 results) Recent Labs    03/19/18 1335 10/21/18 0520  BNP 309.1* 720.2*    ProBNP (last 3 results) No results for input(s): PROBNP in the last 8760 hours.   D-Dimer No results for input(s): DDIMER in the last 72 hours. Hemoglobin A1C No results for input(s): HGBA1C in the last 72 hours. Fasting Lipid Panel Recent Labs  10/21/18 0520  CHOL 113  HDL 35*  LDLCALC 62  TRIG 82  CHOLHDL 3.2   Thyroid Function Tests No results for input(s): TSH, T4TOTAL, T3FREE, THYROIDAB in the last 72 hours.  Invalid input(s): FREET3  Other results:   Imaging    Dg Chest Portable 1 View  Result Date: 10/21/2018 CLINICAL DATA:  85 y/o  M; chest pain and shortness of breath. EXAM: PORTABLE CHEST 1 VIEW COMPARISON:  10/17/2018 chest radiograph FINDINGS: Stable enlarged cardiac silhouette given projection and technique. Lung bases partially excluded from the field of view. Post median sternotomy with wires in alignment. Aortic atherosclerosis with calcification. Clear lungs. No pleural effusion or pneumothorax. No acute osseous abnormality is evident. Chronic widening of the right acromioclavicular  joint. IMPRESSION: No acute pulmonary process identified. Stable enlarged cardiac silhouette. Aortic Atherosclerosis (ICD10-I70.0). Electronically Signed   By: Kristine Garbe M.D.   On: 10/21/2018 06:04      Medications:     Scheduled Medications: . aspirin  81 mg Oral Daily  . atorvastatin  80 mg Oral q1800  . carvedilol  3.125 mg Oral BID WC  . clopidogrel  75 mg Oral Q breakfast  . furosemide  40 mg Intravenous Once  . heparin  5,000 Units Subcutaneous Q8H  . isosorbide mononitrate  60 mg Oral Daily  . potassium chloride  40 mEq Oral Once  . ranolazine  500 mg Oral BID  . sodium chloride flush  3 mL Intravenous Q12H     Infusions: . sodium chloride 10 mL/hr (10/21/18 0542)  . sodium chloride       PRN Medications:  sodium chloride, acetaminophen, morphine injection, ondansetron (ZOFRAN) IV, sodium chloride flush   Assessment/Plan   1. CAD: s/p NSTEMI earlier this week, troponin up to 10.  Cath (1/27) showed occluded SVG-PDA but good flow down native RCA, occluded SVG-D with 70% D1 stenosis, sequential SVG-ramus and distal LCx with occluded limb to distal LCx => this was thought to be the culprit.  LIMA-LAD patent.  No interventional target. The patient was doing well at discharge on 1/28, then redeveloped severe chest pain early 1/30. ECG with ST-T changes.  Given severe ongoing pain, he was taken to cath lab with repeat cath 1/30 similar to 1/27 cath, no interventional target.  Troponin 13.8 => 14.8.  He is still having some chest pain.  Plan for now for aggressive medical management.   - Continue atorvastatin 80 mg daily.  - Restart losartan if creatinine remains stable.   - Continue ASA 81 and continue Plavix 75 daily.  - Start Coreg 3.125 mg bid (slow titration given history of bradycardia).  - Increase Imdur to 60 mg daily.  - Trend troponin, ?rising to new peak and signifying a new event.  - QTc 519 but has very wide RBBB.  Will start ranolazine 500 mg bid  and closely follow QT interval.   - If CP remains recalcitrant, ?FFR distal RCA lesion but does not look like it should cause rest pain.  2. Acute on chronic diastolic CHF: Echo (4/78) with EF 65-70%, suspect inferolateral hypokinesis, mild AS.  LVEDP 20 on cath, he does have some volume overload on exam this morning.  BNP 720.  - Lasix 40 mg IV x 1 now and stop IV fluid.  3. Aortic stenosis: Mild on last echo.  4. CKD: Stage 3, creatinine 1.47 today.   Will watch in hospital to make sure that angina is under control.   Length of Stay:  0  [name], MD  10/21/2018, 8:12 AM  Advanced Heart Failure Team Pager [phone] (M-F; 7a - 4p)  Please contact [facility] for night-coverage after hours (4p -7a ) and weekends on [email or web address]

## 2018-10-22 ENCOUNTER — Encounter (HOSPITAL_COMMUNITY)
Admission: EM | Disposition: A | Discharge status: Skilled Nursing Facility | Payer: Self-pay | Source: Home / Self Care | Attending: Cardiovascular Disease

## 2018-10-22 ENCOUNTER — Encounter (HOSPITAL_COMMUNITY): Payer: Self-pay | Admitting: Cardiovascular Disease

## 2018-10-22 HISTORY — PX: INTRAVASCULAR PRESSURE WIRE/FFR STUDY: CATH118243

## 2018-10-22 HISTORY — PX: CORONARY ANGIOGRAPHY: CATH118303

## 2018-10-22 LAB — POCT ACTIVATED CLOTTING TIME: Activated Clotting Time: 263 seconds

## 2018-10-22 LAB — CBC
HCT: 35.1 % — ABNORMAL LOW (ref 39.0–52.0)
Hemoglobin: 11 g/dL — ABNORMAL LOW (ref 13.0–17.0)
MCH: 30.8 pg (ref 26.0–34.0)
MCHC: 31.3 g/dL (ref 30.0–36.0)
MCV: 98.3 fL (ref 80.0–100.0)
PLATELETS: 136 10*3/uL — AB (ref 150–400)
RBC: 3.57 MIL/uL — ABNORMAL LOW (ref 4.22–5.81)
RDW: 13.9 % (ref 11.5–15.5)
WBC: 14.7 10*3/uL — ABNORMAL HIGH (ref 4.0–10.5)
nRBC: 0 % (ref 0.0–0.2)

## 2018-10-22 LAB — TROPONIN I
Troponin I: 14.65 ng/mL (ref <0.03)
Troponin I: 11.97 ng/mL (ref <0.03)

## 2018-10-22 LAB — CREATININE, SERUM
Creatinine, Ser: 1.24 mg/dL (ref 0.61–1.24)
GFR calc Af Amer: >60 mL/min (ref >60)
GFR calc non Af Amer: 53 mL/min — ABNORMAL LOW (ref >60)

## 2018-10-22 SURGERY — INTRAVASCULAR PRESSURE WIRE/FFR STUDY
Anesthesia: LOCAL

## 2018-10-22 MED ORDER — HEPARIN SODIUM (PORCINE) 5000 UNIT/ML IJ SOLN
5000.0000 [IU] | Freq: Three times a day (TID) | INTRAMUSCULAR | Status: DC
  Administered 2018-10-23 – 2018-10-26 (×12): 5000 [IU] via SUBCUTANEOUS
  Filled 2018-10-22 (×13): qty 1

## 2018-10-22 MED ORDER — HEPARIN SODIUM (PORCINE) 1000 UNIT/ML IJ SOLN
INTRAMUSCULAR | Status: DC | PRN
  Administered 2018-10-22: 8000 [IU] via INTRAVENOUS

## 2018-10-22 MED ORDER — MIDAZOLAM HCL 2 MG/2ML IJ SOLN
INTRAMUSCULAR | Status: DC | PRN
  Administered 2018-10-22: 1 mg via INTRAVENOUS

## 2018-10-22 MED ORDER — FENTANYL CITRATE (PF) 100 MCG/2ML IJ SOLN
INTRAMUSCULAR | Status: AC
  Filled 2018-10-22: qty 2

## 2018-10-22 MED ORDER — SODIUM CHLORIDE 0.9 % IV SOLN
INTRAVENOUS | Status: DC
  Administered 2018-10-22: 10:00:00 via INTRAVENOUS

## 2018-10-22 MED ORDER — HEPARIN (PORCINE) IN NACL 1000-0.9 UT/500ML-% IV SOLN
INTRAVENOUS | Status: AC
  Filled 2018-10-22: qty 1000

## 2018-10-22 MED ORDER — SODIUM CHLORIDE 0.9% FLUSH: 3.0000 mL | Freq: Two times a day (BID) | INTRAVENOUS | Status: DC

## 2018-10-22 MED ORDER — LIDOCAINE HCL (PF) 1 % IJ SOLN
INTRAMUSCULAR | Status: DC | PRN
  Administered 2018-10-22: 2 mL via INTRADERMAL

## 2018-10-22 MED ORDER — IOHEXOL 350 MG/ML SOLN
INTRAVENOUS | Status: DC | PRN
  Administered 2018-10-22: 100 mL via INTRACARDIAC

## 2018-10-22 MED ORDER — HEPARIN SODIUM (PORCINE) 1000 UNIT/ML IJ SOLN
INTRAMUSCULAR | Status: AC
  Filled 2018-10-22: qty 1

## 2018-10-22 MED ORDER — DIAZEPAM 5 MG PO TABS: 5.0000 mg | ORAL_TABLET | Freq: Four times a day (QID) | ORAL | Status: DC | PRN

## 2018-10-22 MED ORDER — FUROSEMIDE 20 MG PO TABS
20.0000 mg | ORAL_TABLET | Freq: Every day | ORAL | Status: DC
  Administered 2018-10-22 – 2018-10-24 (×3): 20 mg via ORAL
  Filled 2018-10-22 (×3): qty 1

## 2018-10-22 MED ORDER — ISOSORBIDE MONONITRATE ER 60 MG PO TB24
90.0000 mg | ORAL_TABLET | Freq: Every day | ORAL | Status: DC
  Administered 2018-10-22 – 2018-10-28 (×7): 90 mg via ORAL
  Filled 2018-10-22 (×8): qty 1

## 2018-10-22 MED ORDER — VERAPAMIL HCL 2.5 MG/ML IV SOLN
INTRAVENOUS | Status: AC
  Filled 2018-10-22: qty 2

## 2018-10-22 MED ORDER — ADENOSINE 12 MG/4ML IV SOLN
INTRAVENOUS | Status: AC
  Filled 2018-10-22: qty 16

## 2018-10-22 MED ORDER — CARVEDILOL 6.25 MG PO TABS
6.2500 mg | ORAL_TABLET | Freq: Two times a day (BID) | ORAL | Status: DC
  Administered 2018-10-22 – 2018-10-24 (×5): 6.25 mg via ORAL
  Filled 2018-10-22 (×5): qty 1

## 2018-10-22 MED ORDER — CLOPIDOGREL BISULFATE 75 MG PO TABS: 75.0000 mg | ORAL_TABLET | Freq: Every day | ORAL | Status: DC

## 2018-10-22 MED ORDER — ACETAMINOPHEN 325 MG PO TABS: 650.0000 mg | ORAL_TABLET | ORAL | Status: DC | PRN

## 2018-10-22 MED ORDER — VERAPAMIL HCL 2.5 MG/ML IV SOLN
INTRAVENOUS | Status: DC | PRN
  Administered 2018-10-22: 10 mL via INTRA_ARTERIAL

## 2018-10-22 MED ORDER — SODIUM CHLORIDE 0.9% FLUSH
3.0000 mL | Freq: Two times a day (BID) | INTRAVENOUS | Status: DC
  Administered 2018-10-22 – 2018-10-24 (×3): 3 mL via INTRAVENOUS

## 2018-10-22 MED ORDER — MIDAZOLAM HCL 2 MG/2ML IJ SOLN
INTRAMUSCULAR | Status: AC
  Filled 2018-10-22: qty 2

## 2018-10-22 MED ORDER — SODIUM CHLORIDE 0.9 % IV SOLN: 250.0000 mL | INTRAVENOUS | Status: DC | PRN

## 2018-10-22 MED ORDER — ONDANSETRON HCL 4 MG/2ML IJ SOLN: 4.0000 mg | Freq: Four times a day (QID) | INTRAMUSCULAR | Status: DC | PRN

## 2018-10-22 MED ORDER — ASPIRIN 81 MG PO CHEW
81.0000 mg | CHEWABLE_TABLET | ORAL | Status: AC
  Administered 2018-10-22: 81 mg via ORAL

## 2018-10-22 MED ORDER — HEPARIN (PORCINE) IN NACL 1000-0.9 UT/500ML-% IV SOLN
INTRAVENOUS | Status: DC | PRN
  Administered 2018-10-22 (×2): 500 mL

## 2018-10-22 MED ORDER — FENTANYL CITRATE (PF) 100 MCG/2ML IJ SOLN
INTRAMUSCULAR | Status: DC | PRN
  Administered 2018-10-22: 25 ug via INTRAVENOUS

## 2018-10-22 MED ORDER — SODIUM CHLORIDE 0.9 % IV SOLN: INTRAVENOUS | Status: AC

## 2018-10-22 MED ORDER — SODIUM CHLORIDE 0.9 % IV SOLN: INTRAVENOUS | Status: DC

## 2018-10-22 MED ORDER — LIDOCAINE HCL (PF) 1 % IJ SOLN
INTRAMUSCULAR | Status: AC
  Filled 2018-10-22: qty 30

## 2018-10-22 MED ORDER — ASPIRIN 81 MG PO CHEW: 81.0000 mg | CHEWABLE_TABLET | Freq: Every day | ORAL | Status: DC

## 2018-10-22 MED ORDER — SODIUM CHLORIDE 0.9% FLUSH: 3.0000 mL | INTRAVENOUS | Status: DC | PRN

## 2018-10-22 MED ORDER — ADENOSINE (DIAGNOSTIC) 140MCG/KG/MIN
INTRAVENOUS | Status: DC | PRN
  Administered 2018-10-22: 140 ug/kg/min via INTRAVENOUS

## 2018-10-22 MED FILL — Verapamil HCl IV Soln 2.5 MG/ML: INTRAVENOUS | Qty: 2 | Status: AC

## 2018-10-22 SURGICAL SUPPLY — 15 items
CATH LAUNCHER 6FR JR4 (CATHETERS) ×1 IMPLANT
DEVICE RAD COMP TR BAND LRG (VASCULAR PRODUCTS) ×1 IMPLANT
GLIDESHEATH SLEND SS 6F .021 (SHEATH) ×1 IMPLANT
GUIDEWIRE INQWIRE 1.5J.035X260 (WIRE) IMPLANT
GUIDEWIRE PRESSURE COMET II (WIRE) ×1 IMPLANT
INQWIRE 1.5J .035X260CM (WIRE) ×2
KIT ESSENTIALS PG (KITS) ×2 IMPLANT
KIT HEART LEFT (KITS) ×2 IMPLANT
PACK CARDIAC CATHETERIZATION (CUSTOM PROCEDURE TRAY) ×2 IMPLANT
SHEATH PINNACLE 6F 10CM (SHEATH) IMPLANT
SHEATH PROBE COVER 6X72 (BAG) ×1 IMPLANT
TRANSDUCER W/STOPCOCK (MISCELLANEOUS) ×2 IMPLANT
WIRE EMERALD 3MM-J .035X150CM (WIRE) IMPLANT
WIRE HI TORQ VERSACORE-J 145CM (WIRE) ×1 IMPLANT
WIRE MICROINTRODUCER 60CM (WIRE) ×1 IMPLANT

## 2018-10-22 NOTE — Progress Notes (Signed)
Patient ID: Eric Gordon, male   DOB: [birth year], 85 y.o.   MRN: [id number]     Advanced Heart Failure Rounding Note  PCP-Cardiologist: No primary care provider on file.   Subjective:    Patient re-developed chest pain (10/10) 1/30 early am, ECG with ST/T changes compared to prior (baseline RBBB).  Taken to cath lab 1/31 early morning, no significant change in coronary anatomy compared to prior cath this week. He continued to have chest pain through the day yesterday and today, ranging 3-5/10 currently and radiating to the left shoulder.   Echo: EF 55-60%, inferolateral hypokinesis, normal RV, mild AS.   Cath: LIMA-LAD patent, SVG-D TO (old), SVG-PDA TO (old), sequential SVG-ramus and distal LCx with occluded limb to distal LCx (this was thought to be the culprit for NSTEMI earlier this week), 60-70% mRCA + 70% dRCA, 50% ostial LCx, 100% ostial ramus, LVEDP 20.    Objective:   Weight Range: 106.9 kg Body mass index is 29.46 kg/m.   Vital Signs:   Temp:  [98.4 F (36.9 C)-99.4 F (37.4 C)] 98.8 F (37.1 C) (01/31 0400) Pulse Rate:  [72-94] 86 (01/31 0700) Resp:  [15-37] 20 (01/31 0700) BP: (80-131)/(56-95) 131/84 (01/31 0700) SpO2:  [81 %-100 %] 81 % (01/31 0700) Last BM Date: (PTA)  Weight change: Filed Weights   10/21/18 0522  Weight: 106.9 kg    Intake/Output:   Intake/Output Summary (Last 24 hours) at 10/22/2018 0740 Last data filed at 10/22/2018 0400 Gross per 24 hour  Intake 1213.32 ml  Output 175 ml  Net 1038.32 ml      Physical Exam    General: NAD Neck: No JVD, no thyromegaly or thyroid nodule.  Lungs: Clear to auscultation bilaterally with normal respiratory effort. CV: Nondisplaced PMI.  Heart regular S1/S2, no S3/S4, no murmur.  No peripheral edema.   Abdomen: Soft, nontender, no hepatosplenomegaly, no distention.  Skin: Intact without lesions or rashes.  Neurologic: Alert and oriented x 3.  Psych: Normal affect. Extremities: No clubbing or  cyanosis.  HEENT: Normal.    Telemetry   NSR occasional PVC in 80s (personally reviewed)  EKG    NSR, RBBB, some ST-T changes compared to prior (personally reviewed).   Labs    CBC Recent Labs    10/21/18 0520 10/21/18 0531 10/21/18 2130  WBC 12.1*  --  13.8*  NEUTROABS 8.0*  --  11.0*  HGB 14.0 15.3 12.4*  HCT 44.5 45.0 39.1  MCV 97.8  --  96.5  PLT 146*  --  376*   Basic Metabolic Panel Recent Labs    10/21/18 0520 10/21/18 0531 10/21/18 2130  NA 142 139 137  K 3.8 3.8 4.5  CL 107  --  105  CO2 23  --  19*  GLUCOSE 116*  --  111*  BUN 20  --  27*  CREATININE 1.47* 1.40* 1.36*  CALCIUM 9.2  --  8.6*   Liver Function Tests Recent Labs    10/21/18 0520  AST 49*  ALT 26  ALKPHOS 51  BILITOT 1.0  PROT 6.7  ALBUMIN 3.3*   No results for input(s): LIPASE, AMYLASE in the last 72 hours. Cardiac Enzymes Recent Labs    10/21/18 0851 10/21/18 1337 10/21/18 2130  TROPONINI 13.50* 12.10* 13.23*    BNP: BNP (last 3 results) Recent Labs    03/19/18 1335 10/21/18 0520  BNP 309.1* 720.2*    ProBNP (last 3 results) No results for input(s): PROBNP in the last  8760 hours.   D-Dimer No results for input(s): DDIMER in the last 72 hours. Hemoglobin A1C No results for input(s): HGBA1C in the last 72 hours. Fasting Lipid Panel Recent Labs    10/21/18 0520  CHOL 113  HDL 35*  LDLCALC 62  TRIG 82  CHOLHDL 3.2   Thyroid Function Tests No results for input(s): TSH, T4TOTAL, T3FREE, THYROIDAB in the last 72 hours.  Invalid input(s): FREET3  Other results:   Imaging    No results found.   Medications:     Scheduled Medications: . aspirin  81 mg Oral Daily  . atorvastatin  80 mg Oral q1800  . carvedilol  6.25 mg Oral BID WC  . clopidogrel  75 mg Oral Q breakfast  . furosemide  20 mg Oral Daily  . heparin  5,000 Units Subcutaneous Q8H  . isosorbide mononitrate  90 mg Oral Daily  . ranolazine  500 mg Oral BID  . sodium chloride flush   3 mL Intravenous Q12H    Infusions: . sodium chloride 200 mL/hr at 10/21/18 1819  . sodium chloride      PRN Medications: sodium chloride, acetaminophen, morphine injection, ondansetron (ZOFRAN) IV, sodium chloride flush   Assessment/Plan   1. CAD: s/p NSTEMI earlier this week, troponin up to 10.  Cath (1/27) showed occluded SVG-PDA but good flow down native RCA, occluded SVG-D with 70% D1 stenosis, sequential SVG-ramus and distal LCx with occluded limb to distal LCx => this was thought to be the culprit.  LIMA-LAD patent.  No interventional target. The patient was doing well at discharge on 1/28, then redeveloped severe chest pain early 1/30. ECG with ST-T changes.  Given severe ongoing pain, he was taken to cath lab with repeat cath 1/30 similar to 1/27 cath, no definite interventional target.  Troponin 13.8 => 14.8 => 12.1 => 13.2.  He is still having some chest pain radiating to his left shoulder, has had now for about 24 hrs straight.   - I discussed with Dr. Claiborne Billings, will take back to lab to [facility] across RCA lesions.  If significant, would treat.  - Continue atorvastatin 80 mg daily.  - Restart losartan if creatinine remains stable.   - Continue ASA 81 and continue Plavix 75 daily.  - Increase Coreg to 6.25 mg bid (slow titration given history of bradycardia).  - Increase Imdur to 90 mg daily.  - Trend troponin, ?rising to new peak and signifying a new event.  - Continue ranolazine, QTc in setting of wide RBBB stable at 503 msec.   2. Acute on chronic diastolic CHF: Echo (9/60) with EF 65-70%, suspect inferolateral hypokinesis, mild AS.  LVEDP 20 on cath, got IV Lasix yesterday.  BNP 720.  - Restart his po Lasix.   3. Aortic stenosis: Mild on last echo.  4. CKD: Stage 3, creatinine 1.36 today.   Discussed FFR with family, he and they agree to procedure.   Length of Stay: 1  [name], MD  10/22/2018, 7:40 AM  Advanced Heart Failure Team Pager [phone] (M-F; 7a - 4p)  Please  contact [facility] for night-coverage after hours (4p -7a ) and weekends on [email or web address]

## 2018-10-23 ENCOUNTER — Encounter (HOSPITAL_COMMUNITY): Payer: Self-pay

## 2018-10-23 ENCOUNTER — Inpatient Hospital Stay (HOSPITAL_COMMUNITY): Payer: Medicare Other

## 2018-10-23 LAB — CBC WITH DIFFERENTIAL/PLATELET
ABS IMMATURE GRANULOCYTES: 0.09 10*3/uL — AB (ref 0.00–0.07)
Basophils Absolute: 0 10*3/uL (ref 0.0–0.1)
Basophils Relative: 0 %
Eosinophils Absolute: 0 10*3/uL (ref 0.0–0.5)
Eosinophils Relative: 0 %
HCT: 35.7 % — ABNORMAL LOW (ref 39.0–52.0)
Hemoglobin: 11.6 g/dL — ABNORMAL LOW (ref 13.0–17.0)
Immature Granulocytes: 1 %
LYMPHS PCT: 10 %
Lymphs Abs: 1.4 10*3/uL (ref 0.7–4.0)
MCH: 31 pg (ref 26.0–34.0)
MCHC: 32.5 g/dL (ref 30.0–36.0)
MCV: 95.5 fL (ref 80.0–100.0)
Monocytes Absolute: 1.9 10*3/uL — ABNORMAL HIGH (ref 0.1–1.0)
Monocytes Relative: 14 %
Neutro Abs: 10.2 10*3/uL — ABNORMAL HIGH (ref 1.7–7.7)
Neutrophils Relative %: 75 %
Platelets: 143 10*3/uL — ABNORMAL LOW (ref 150–400)
RBC: 3.74 MIL/uL — ABNORMAL LOW (ref 4.22–5.81)
RDW: 13.9 % (ref 11.5–15.5)
WBC: 13.6 10*3/uL — AB (ref 4.0–10.5)
nRBC: 0 % (ref 0.0–0.2)

## 2018-10-23 LAB — BASIC METABOLIC PANEL
Anion gap: 11 (ref 5–15)
BUN: 38 mg/dL — ABNORMAL HIGH (ref 8–23)
CO2: 20 mmol/L — ABNORMAL LOW (ref 22–32)
Calcium: 8.8 mg/dL — ABNORMAL LOW (ref 8.9–10.3)
Chloride: 103 mmol/L (ref 98–111)
Creatinine, Ser: 1.48 mg/dL — ABNORMAL HIGH (ref 0.61–1.24)
GFR calc Af Amer: 49 mL/min — ABNORMAL LOW (ref >60)
GFR calc non Af Amer: 43 mL/min — ABNORMAL LOW (ref >60)
Glucose, Bld: 120 mg/dL — ABNORMAL HIGH (ref 70–99)
Potassium: 4.8 mmol/L (ref 3.5–5.1)
Sodium: 134 mmol/L — ABNORMAL LOW (ref 135–145)

## 2018-10-23 MED ORDER — FUROSEMIDE 10 MG/ML IJ SOLN
40.0000 mg | Freq: Once | INTRAMUSCULAR | Status: AC
  Administered 2018-10-23: 40 mg via INTRAVENOUS
  Filled 2018-10-23: qty 4

## 2018-10-23 MED ORDER — TECHNETIUM TO 99M ALBUMIN AGGREGATED
4.2000 | Freq: Once | INTRAVENOUS | Status: AC | PRN
  Administered 2018-10-23: 4.2 via INTRAVENOUS

## 2018-10-23 MED ORDER — TECHNETIUM TC 99M DIETHYLENETRIAME-PENTAACETIC ACID
32.3000 | Freq: Once | INTRAVENOUS | Status: AC | PRN
  Administered 2018-10-23: 32.3 via RESPIRATORY_TRACT

## 2018-10-23 NOTE — Progress Notes (Addendum)
Patient ID: Eric Gordon, male   DOB: 01-Apr-1931, 85 y.o.   MRN: [id number]     Advanced Heart Failure Rounding Note  PCP-Cardiologist: No primary care provider on file.   Subjective:    Patient re-developed chest pain (10/10) 1/30 early am, ECG with ST/T changes compared to prior (baseline RBBB).  Taken to cath lab 1/31 early morning, no significant change in coronary anatomy compared to prior cath earlier in the week.   Went for [facility] yesterday. mRCA 70% dRCA 70% FFR was not felt to be significant at 0.90 beyond the distal lesion and 0.89 beyond the mid lesion. Medical therapy recommended.   Says he was feeling better this am. But just got up to go to the bathroom and feels much worse. Very SOB and sweaty. RR into 40s. Mild chest pressure   Trop 14.65 -> 11.97  Echo: EF 55-60%, inferolateral hypokinesis, normal RV, mild AS.   Cath: LIMA-LAD patent, SVG-D TO (old), SVG-PDA TO (old), sequential SVG-ramus and distal LCx with occluded limb to distal LCx (this was thought to be the culprit for NSTEMI earlier this week), 60-70% mRCA + 70% dRCA, 50% ostial LCx, 100% ostial ramus, LVEDP 20.    Objective:   Weight Range: 106.9 kg Body mass index is 29.46 kg/m.   Vital Signs:   Temp:  [97.9 F (36.6 C)-98.9 F (37.2 C)] 98.2 F (36.8 C) (02/01 0700) Pulse Rate:  [72-140] 74 (02/01 0600) Resp:  [14-42] 28 (02/01 0600) BP: (84-125)/(55-88) 111/69 (02/01 0600) SpO2:  [88 %-98 %] 94 % (02/01 0600) Last BM Date: (PTA)  Weight change: Filed Weights   10/21/18 0522  Weight: 106.9 kg    Intake/Output:   Intake/Output Summary (Last 24 hours) at 10/23/2018 0949 Last data filed at 10/23/2018 0600 Gross per 24 hour  Intake 424.43 ml  Output 400 ml  Net 24.43 ml      Physical Exam    General: elderly diaphoretic and SOB. No resp difficulty HEENT: normal Neck: supple. JVP 8 Carotids 2+ bilat;+ bruits. No lymphadenopathy or thryomegaly appreciated. Cor: PMI nondisplaced.  Regular rate & rhythm. 2/6 AS Lungs: tachypneic. But clear Abdomen: soft, nontender, nondistended. No hepatosplenomegaly. No bruits or masses. Good bowel sounds. Extremities: no cyanosis, clubbing, rash, edema Neuro: alert & orientedx3, cranial nerves grossly intact. moves all 4 extremities w/o difficulty. Affect pleasant   Telemetry   NSR occasional PVC in 70s (personally reviewed)  EKG    NSR, RBBB, some ST-T changes compared to prior (personally reviewed).   Labs    CBC Recent Labs    10/21/18 2130 10/22/18 1345 10/23/18 0300  WBC 13.8* 14.7* 13.6*  NEUTROABS 11.0*  --  10.2*  HGB 12.4* 11.0* 11.6*  HCT 39.1 35.1* 35.7*  MCV 96.5 98.3 95.5  PLT 146* 136* 443*   Basic Metabolic Panel Recent Labs    10/21/18 2130 10/22/18 1345 10/23/18 0300  NA 137  --  134*  K 4.5  --  4.8  CL 105  --  103  CO2 19*  --  20*  GLUCOSE 111*  --  120*  BUN 27*  --  38*  CREATININE 1.36* 1.24 1.48*  CALCIUM 8.6*  --  8.8*   Liver Function Tests Recent Labs    10/21/18 0520  AST 49*  ALT 26  ALKPHOS 51  BILITOT 1.0  PROT 6.7  ALBUMIN 3.3*   No results for input(s): LIPASE, AMYLASE in the last 72 hours. Cardiac Enzymes Recent Labs  10/21/18 2130 10/22/18 0820 10/22/18 1345  TROPONINI 13.23* 14.65* 11.97*    BNP: BNP (last 3 results) Recent Labs    03/19/18 1335 10/21/18 0520  BNP 309.1* 720.2*    ProBNP (last 3 results) No results for input(s): PROBNP in the last 8760 hours.   D-Dimer No results for input(s): DDIMER in the last 72 hours. Hemoglobin A1C No results for input(s): HGBA1C in the last 72 hours. Fasting Lipid Panel Recent Labs    10/21/18 0520  CHOL 113  HDL 35*  LDLCALC 62  TRIG 82  CHOLHDL 3.2   Thyroid Function Tests No results for input(s): TSH, T4TOTAL, T3FREE, THYROIDAB in the last 72 hours.  Invalid input(s): FREET3  Other results:   Imaging    No results found.   Medications:     Scheduled Medications: .  aspirin  81 mg Oral Daily  . atorvastatin  80 mg Oral q1800  . carvedilol  6.25 mg Oral BID WC  . clopidogrel  75 mg Oral Q breakfast  . furosemide  20 mg Oral Daily  . heparin  5,000 Units Subcutaneous Q8H  . isosorbide mononitrate  90 mg Oral Daily  . ranolazine  500 mg Oral BID  . sodium chloride flush  3 mL Intravenous Q12H  . sodium chloride flush  3 mL Intravenous Q12H    Infusions: . sodium chloride 200 mL/hr at 10/21/18 1819  . sodium chloride    . sodium chloride      PRN Medications: sodium chloride, sodium chloride, acetaminophen, diazepam, morphine injection, ondansetron (ZOFRAN) IV, sodium chloride flush, sodium chloride flush   Assessment/Plan   1. CAD: s/p NSTEMI earlier this week, troponin up to 10.  Cath (1/27) showed occluded SVG-PDA but good flow down native RCA, occluded SVG-D with 70% D1 stenosis, sequential SVG-ramus and distal LCx with occluded limb to distal LCx => this was thought to be the culprit.  LIMA-LAD patent.  No interventional target. The patient was doing well at discharge on 1/28, then redeveloped severe chest pain early 1/30. ECG with ST-T changes.  Given severe ongoing pain, he was taken to cath lab with repeat cath 1/30 similar to 1/27 cath, no definite interventional target.  Troponin 13.8 => 14.8 => 12.1 => 13.2.  He is still having some chest pain radiating to his left shoulder, has had now for about 24 hrs straight.   - FFR 1/31 of RCA non-significant at 0.90  - Continue atorvastatin 80 mg daily.  - Restart losartan when creatinine back to baseline  - Continue ASA 81 and continue Plavix 75 daily.  - Continue Coreg 6.25 mg bid (slow titration given history of bradycardia).  - Cotninue Imdur to 90 mg daily.  - Troponin coming down - Continue ranolazine, QTc in setting of wide RBBB stable at 503 msec.   2. Acute on chronic diastolic CHF: Echo (2/67) with EF 65-70%, suspect inferolateral hypokinesis, mild AS.  LVEDP 20 on cath - Volume status  looks ok but with marked dyspnea will give one dose IV lasix - Continue po Lasix tomorrow 3. Aortic stenosis: Mild on last echo.  4. CKD: Stage 3, creatinine up slightly at 1.4 today 5. Dyspnea and CP - His symptoms are far our of proportion to cardiac findings to date. I walked him back from bathroom and he was diaphoretic and markedly dyspneic. He has a h/o remote PE and I wonder if he may have had another event. May have some HF but volume doesn't look so  bad. With creatinine up slight from cath will start with CXR and VQ scan. May need CT as well.   Can go to SDU but will need close monitoring.    Length of Stay: 2  Glori Bickers, MD  10/23/2018, 9:49 AM  Advanced Heart Failure Team Pager [phone] (M-F; 7a - 4p)  Please contact [facility] for night-coverage after hours (4p -7a ) and weekends on [email or web address]

## 2018-10-23 NOTE — Plan of Care (Signed)
  Problem: Cardiac: Goal: Vascular access site(s) Level 0-1 will be maintained Outcome: Progressing   Problem: Education: Goal: Knowledge of General Education information will improve Description Including pain rating scale, medication(s)/side effects and non-pharmacologic comfort measures Outcome: Progressing   Problem: Clinical Measurements: Goal: Will remain free from infection Outcome: Progressing Goal: Diagnostic test results will improve Outcome: Progressing

## 2018-10-24 ENCOUNTER — Inpatient Hospital Stay (HOSPITAL_COMMUNITY): Payer: Medicare Other

## 2018-10-24 LAB — CBC WITH DIFFERENTIAL/PLATELET
Abs Immature Granulocytes: 0.06 10*3/uL (ref 0.00–0.07)
Basophils Absolute: 0 10*3/uL (ref 0.0–0.1)
Basophils Relative: 0 %
Eosinophils Absolute: 0.1 10*3/uL (ref 0.0–0.5)
Eosinophils Relative: 1 %
HCT: 34.3 % — ABNORMAL LOW (ref 39.0–52.0)
Hemoglobin: 11.1 g/dL — ABNORMAL LOW (ref 13.0–17.0)
Immature Granulocytes: 1 %
LYMPHS PCT: 12 %
Lymphs Abs: 1.4 10*3/uL (ref 0.7–4.0)
MCH: 30.7 pg (ref 26.0–34.0)
MCHC: 32.4 g/dL (ref 30.0–36.0)
MCV: 94.8 fL (ref 80.0–100.0)
Monocytes Absolute: 1.5 10*3/uL — ABNORMAL HIGH (ref 0.1–1.0)
Monocytes Relative: 12 %
Neutro Abs: 8.9 10*3/uL — ABNORMAL HIGH (ref 1.7–7.7)
Neutrophils Relative %: 74 %
Platelets: 140 10*3/uL — ABNORMAL LOW (ref 150–400)
RBC: 3.62 MIL/uL — ABNORMAL LOW (ref 4.22–5.81)
RDW: 13.8 % (ref 11.5–15.5)
WBC: 11.9 10*3/uL — ABNORMAL HIGH (ref 4.0–10.5)
nRBC: 0 % (ref 0.0–0.2)

## 2018-10-24 LAB — PROCALCITONIN: Procalcitonin: 0.21 ng/mL

## 2018-10-24 LAB — BASIC METABOLIC PANEL
Anion gap: 12 (ref 5–15)
BUN: 44 mg/dL — ABNORMAL HIGH (ref 8–23)
CHLORIDE: 101 mmol/L (ref 98–111)
CO2: 22 mmol/L (ref 22–32)
Calcium: 8.7 mg/dL — ABNORMAL LOW (ref 8.9–10.3)
Creatinine, Ser: 1.37 mg/dL — ABNORMAL HIGH (ref 0.61–1.24)
GFR calc Af Amer: 54 mL/min — ABNORMAL LOW (ref >60)
GFR calc non Af Amer: 47 mL/min — ABNORMAL LOW (ref >60)
Glucose, Bld: 108 mg/dL — ABNORMAL HIGH (ref 70–99)
Potassium: 4.1 mmol/L (ref 3.5–5.1)
Sodium: 135 mmol/L (ref 135–145)

## 2018-10-24 MED ORDER — CARVEDILOL 3.125 MG PO TABS
3.1250 mg | ORAL_TABLET | Freq: Two times a day (BID) | ORAL | Status: DC
  Administered 2018-10-24 – 2018-10-27 (×6): 3.125 mg via ORAL
  Filled 2018-10-24 (×6): qty 1

## 2018-10-24 MED ORDER — FUROSEMIDE 10 MG/ML IJ SOLN
80.0000 mg | Freq: Every day | INTRAMUSCULAR | Status: DC
  Administered 2018-10-24: 80 mg via INTRAVENOUS
  Filled 2018-10-24: qty 8

## 2018-10-24 NOTE — Progress Notes (Addendum)
Patient ID: TYRONN GOLDA, male   DOB: 18-May-1931, 85 y.o.   MRN: [id number]     Advanced Heart Failure Rounding Note  PCP-Cardiologist: No primary care provider on file.   Subjective:    Patient re-developed chest pain (10/10) 1/30 early am, ECG with ST/T changes compared to prior (baseline RBBB).  Taken to cath lab 1/31 early morning, no significant change in coronary anatomy compared to prior cath earlier in the week.   Went for [facility] yesterday. mRCA 70% dRCA 70% FFR was not felt to be significant at 0.90 beyond the distal lesion and 0.89 beyond the mid lesion. Medical therapy recommended.   Trop 14.65 -> 11.97  Remains SOB and tachypneic. RRs remain in 39s. VQ scan from yesterday low prob. CXR with ? Left basilar infiltrate.. Diuresed with IV lasix. No weight on chart. I/O incomplete (-410cc). WBC 13.6 -> 11.9. No fever.    Studies:  Echo: EF 55-60%, inferolateral hypokinesis, normal RV, mild AS.   Cath: LIMA-LAD patent, SVG-D TO (old), SVG-PDA TO (old), sequential SVG-ramus and distal LCx with occluded limb to distal LCx (this was thought to be the culprit for NSTEMI earlier this week), 60-70% mRCA + 70% dRCA, 50% ostial LCx, 100% ostial ramus, LVEDP 20.    Objective:   Weight Range: 106.9 kg Body mass index is 29.46 kg/m.   Vital Signs:   Temp:  [97.3 F (36.3 C)-98.2 F (36.8 C)] 98.2 F (36.8 C) (02/02 0803) Pulse Rate:  [71-85] 71 (02/02 0300) Resp:  [21-41] 31 (02/02 0300) BP: (92-133)/(51-82) 120/76 (02/02 0803) SpO2:  [91 %-100 %] 96 % (02/02 0300) Weight:  [106.9 kg] 106.9 kg (02/01 1034) Last BM Date: 10/23/18  Weight change: Filed Weights   10/21/18 0522 10/23/18 1034  Weight: 106.9 kg 106.9 kg    Intake/Output:   Intake/Output Summary (Last 24 hours) at 10/24/2018 0932 Last data filed at 10/24/2018 0300 Gross per 24 hour  Intake -  Output 650 ml  Net -650 ml      Physical Exam    General:  Elderly lying in bed. Belly breathing HEENT:  normal Neck: supple. JVP 7-8. Carotids 2+ bilat; no bruits. No lymphadenopathy or thryomegaly appreciated. Cor: PMI nondisplaced. Regular rate & rhythm. No rubs, gallops or murmurs. Lungs: tachypneic clear Abdomen: soft, nontender, nondistended. No hepatosplenomegaly. No bruits or masses. Good bowel sounds. Extremities: no cyanosis, clubbing, rash, trace edema Neuro: alert & orientedx3, cranial nerves grossly intact. moves all 4 extremities w/o difficulty. Affect pleasant  Telemetry   NSR occasional PVC in 70-80s (personally reviewed)   Labs    CBC Recent Labs    10/23/18 0300 10/24/18 0318  WBC 13.6* 11.9*  NEUTROABS 10.2* 8.9*  HGB 11.6* 11.1*  HCT 35.7* 34.3*  MCV 95.5 94.8  PLT 143* 440*   Basic Metabolic Panel Recent Labs    10/23/18 0300 10/24/18 0318  NA 134* 135  K 4.8 4.1  CL 103 101  CO2 20* 22  GLUCOSE 120* 108*  BUN 38* 44*  CREATININE 1.48* 1.37*  CALCIUM 8.8* 8.7*   Liver Function Tests No results for input(s): AST, ALT, ALKPHOS, BILITOT, PROT, ALBUMIN in the last 72 hours. No results for input(s): LIPASE, AMYLASE in the last 72 hours. Cardiac Enzymes Recent Labs    10/21/18 2130 10/22/18 0820 10/22/18 1345  TROPONINI 13.23* 14.65* 11.97*    BNP: BNP (last 3 results) Recent Labs    03/19/18 1335 10/21/18 0520  BNP 309.1* 720.2*    ProBNP (  last 3 results) No results for input(s): PROBNP in the last 8760 hours.   D-Dimer No results for input(s): DDIMER in the last 72 hours. Hemoglobin A1C No results for input(s): HGBA1C in the last 72 hours. Fasting Lipid Panel No results for input(s): CHOL, HDL, LDLCALC, TRIG, CHOLHDL, LDLDIRECT in the last 72 hours. Thyroid Function Tests No results for input(s): TSH, T4TOTAL, T3FREE, THYROIDAB in the last 72 hours.  Invalid input(s): FREET3  Other results:   Imaging    Nm Pulmonary Perf And Vent  Result Date: 10/23/2018 CLINICAL DATA:  Chest pain and shortness of breath. EXAM: NUCLEAR  MEDICINE VENTILATION - PERFUSION LUNG SCAN TECHNIQUE: Ventilation images were obtained in multiple projections using inhaled aerosol Tc-89m DTPA. Perfusion images were obtained in multiple projections after intravenous injection of Tc-7m MAA. RADIOPHARMACEUTICALS:  32.3 mCi of Tc-4m DTPA aerosol inhalation and 4.2 mCi Tc108m MAA IV COMPARISON:  Single-view of the chest today. PA and lateral chest 02/17/2018. FINDINGS: Ventilation: No focal ventilation defect. Perfusion: Single subsegmental defect is seen in the superior right lung. No segmental defect is identified. IMPRESSION: Very low probability for pulmonary embolus. Electronically Signed   By: Inge Rise M.D.   On: 10/23/2018 13:06   Dg Chest Port 1 View  Result Date: 10/23/2018 CLINICAL DATA:  Dyspnea. EXAM: PORTABLE CHEST 1 VIEW COMPARISON:  Single-view of the chest 10/21/2018 and 10/17/2018. PA and lateral chest 05/19/2018. FINDINGS: The patient is status post CABG. Atherosclerosis is noted. Lung volumes are lower than on the comparison examinations. There is new left basilar airspace disease. The right lung is clear. No pneumothorax or pleural effusion. No acute or focal bony abnormality. IMPRESSION: Left basilar airspace disease is new since the prior exam and could be due to atelectasis in this low volume chest or pneumonia. Atherosclerosis. Electronically Signed   By: Inge Rise M.D.   On: 10/23/2018 12:37     Medications:     Scheduled Medications: . aspirin  81 mg Oral Daily  . atorvastatin  80 mg Oral q1800  . carvedilol  6.25 mg Oral BID WC  . clopidogrel  75 mg Oral Q breakfast  . furosemide  20 mg Oral Daily  . heparin  5,000 Units Subcutaneous Q8H  . isosorbide mononitrate  90 mg Oral Daily  . ranolazine  500 mg Oral BID  . sodium chloride flush  3 mL Intravenous Q12H  . sodium chloride flush  3 mL Intravenous Q12H    Infusions: . sodium chloride 200 mL/hr at 10/21/18 1819  . sodium chloride    . sodium  chloride      PRN Medications: sodium chloride, sodium chloride, acetaminophen, diazepam, morphine injection, ondansetron (ZOFRAN) IV, sodium chloride flush, sodium chloride flush   Assessment/Plan   1. CAD: s/p NSTEMI earlier this week, troponin up to 10.  Cath (1/27) showed occluded SVG-PDA but good flow down native RCA, occluded SVG-D with 70% D1 stenosis, sequential SVG-ramus and distal LCx with occluded limb to distal LCx => this was thought to be the culprit.  LIMA-LAD patent.  No interventional target. The patient was doing well at discharge on 1/28, then redeveloped severe chest pain early 1/30. ECG with ST-T changes.  Given severe ongoing pain, he was taken to cath lab with repeat cath 1/30 similar to 1/27 cath, no definite interventional target.  Troponin 13.8 => 14.8 => 12.1 => 13.2.  He is still having some chest pain radiating to his left shoulder, has had now for about 24 hrs straight.   -  FFR 1/31 of RCA non-significant at 0.90  - Continue atorvastatin 80 mg daily.  - Restart losartan prior to d/c - Continue ASA 81 and continue Plavix 75 daily.  - Cut Coreg to 3.125 mg bid with worsening dyspnea (slow titration given history of bradycardia).  - Cotninue Imdur to 90 mg daily.  - Troponin coming down - Continue ranolazine, QTc in setting of wide RBBB stable at 503 msec.   2. Acute on chronic diastolic CHF: Echo (7/62) with EF 65-70%, suspect inferolateral hypokinesis, mild AS.  LVEDP 20 on cath - Volume status looks slightly elevated. With stable creatinine and worsening dyspnea will give 80 IV lasix x 1 today 3. Aortic stenosis: Mild on last echo.  4. CKD: Stage 3, creatinine improved slightly 1.37 today 5. Dyspnea and CP - His symptoms are far our of proportion to cardiac findings to date. He is quite tachypneic. VQ 2/1 low prob (personally reviewed). pCXR ? LLL infiltrate per Radiology but I reviewed and seems unimpressive. Will Continue IV diuresis. CT chest today no contrast.  Incentive spirometer.  6. Dispo - Suspect he will need SNF.    Length of Stay: 3  Glori Bickers, MD  10/24/2018, 9:32 AM  Advanced Heart Failure Team Pager [phone] (M-F; 7a - 4p)  Please contact [facility] for night-coverage after hours (4p -7a ) and weekends on [email or web address]  Addendum:  CT chest reviewed personally. Mild atx (versus infiltrate in LLL). No fever or productive cough. Will provide incentive spirometer and follow. Check PCT.   Glori Bickers, MD  3:24 PM

## 2018-10-24 NOTE — Plan of Care (Signed)
  Problem: Education: Goal: Understanding of cardiac disease, CV risk reduction, and recovery process will improve Outcome: Progressing Goal: Understanding of medication regimen will improve Outcome: Progressing Goal: Individualized Educational Video(s) Outcome: Progressing   Problem: Activity: Goal: Ability to tolerate increased activity will improve Outcome: Progressing   Problem: Cardiac: Goal: Ability to achieve and maintain adequate cardiopulmonary perfusion will improve Outcome: Progressing Goal: Vascular access site(s) Level 0-1 will be maintained Outcome: Progressing   Problem: Health Behavior/Discharge Planning: Goal: Ability to safely manage health-related needs after discharge will improve Outcome: Progressing   Problem: Health Behavior/Discharge Planning: Goal: Ability to safely manage health-related needs after discharge will improve Outcome: Progressing   Problem: Education: Goal: Knowledge of General Education information will improve Description Including pain rating scale, medication(s)/side effects and non-pharmacologic comfort measures Outcome: Progressing   Problem: Health Behavior/Discharge Planning: Goal: Ability to manage health-related needs will improve Outcome: Progressing   Problem: Clinical Measurements: Goal: Ability to maintain clinical measurements within normal limits will improve Outcome: Progressing Goal: Will remain free from infection Outcome: Progressing Goal: Diagnostic test results will improve Outcome: Progressing Goal: Respiratory complications will improve Outcome: Progressing Goal: Cardiovascular complication will be avoided Outcome: Progressing   Problem: Activity: Goal: Risk for activity intolerance will decrease Outcome: Progressing   Problem: Nutrition: Goal: Adequate nutrition will be maintained Outcome: Progressing   Problem: Coping: Goal: Level of anxiety will decrease Outcome: Progressing   Problem:  Elimination: Goal: Will not experience complications related to bowel motility Outcome: Progressing Goal: Will not experience complications related to urinary retention Outcome: Progressing   Problem: Pain Managment: Goal: General experience of comfort will improve Outcome: Progressing   Problem: Skin Integrity: Goal: Risk for impaired skin integrity will decrease Outcome: Progressing   Problem: Education: Goal: Understanding of CV disease, CV risk reduction, and recovery process will improve Outcome: Progressing Goal: Individualized Educational Video(s) Outcome: Progressing   Problem: Activity: Goal: Ability to return to baseline activity level will improve Outcome: Progressing   Problem: Cardiovascular: Goal: Ability to achieve and maintain adequate cardiovascular perfusion will improve Outcome: Progressing Goal: Vascular access site(s) Level 0-1 will be maintained Outcome: Progressing   Problem: Health Behavior/Discharge Planning: Goal: Ability to safely manage health-related needs after discharge will improve Outcome: Progressing

## 2018-10-25 LAB — BASIC METABOLIC PANEL
Anion gap: 13 (ref 5–15)
BUN: 48 mg/dL — ABNORMAL HIGH (ref 8–23)
CHLORIDE: 103 mmol/L (ref 98–111)
CO2: 24 mmol/L (ref 22–32)
Calcium: 8.6 mg/dL — ABNORMAL LOW (ref 8.9–10.3)
Creatinine, Ser: 1.49 mg/dL — ABNORMAL HIGH (ref 0.61–1.24)
GFR calc Af Amer: 49 mL/min — ABNORMAL LOW (ref >60)
GFR calc non Af Amer: 42 mL/min — ABNORMAL LOW (ref >60)
Glucose, Bld: 100 mg/dL — ABNORMAL HIGH (ref 70–99)
Potassium: 3.8 mmol/L (ref 3.5–5.1)
Sodium: 140 mmol/L (ref 135–145)

## 2018-10-25 LAB — CBC WITH DIFFERENTIAL/PLATELET
Abs Immature Granulocytes: 0.08 10*3/uL — ABNORMAL HIGH (ref 0.00–0.07)
Basophils Absolute: 0 10*3/uL (ref 0.0–0.1)
Basophils Relative: 0 %
Eosinophils Absolute: 0.2 10*3/uL (ref 0.0–0.5)
Eosinophils Relative: 2 %
HCT: 37.1 % — ABNORMAL LOW (ref 39.0–52.0)
HEMOGLOBIN: 12.1 g/dL — AB (ref 13.0–17.0)
Immature Granulocytes: 1 %
LYMPHS PCT: 15 %
Lymphs Abs: 1.6 10*3/uL (ref 0.7–4.0)
MCH: 31.1 pg (ref 26.0–34.0)
MCHC: 32.6 g/dL (ref 30.0–36.0)
MCV: 95.4 fL (ref 80.0–100.0)
Monocytes Absolute: 1.4 10*3/uL — ABNORMAL HIGH (ref 0.1–1.0)
Monocytes Relative: 13 %
Neutro Abs: 7.7 10*3/uL (ref 1.7–7.7)
Neutrophils Relative %: 69 %
Platelets: 179 10*3/uL (ref 150–400)
RBC: 3.89 MIL/uL — ABNORMAL LOW (ref 4.22–5.81)
RDW: 13.9 % (ref 11.5–15.5)
WBC: 11.1 10*3/uL — ABNORMAL HIGH (ref 4.0–10.5)
nRBC: 0 % (ref 0.0–0.2)

## 2018-10-25 LAB — PROCALCITONIN: Procalcitonin: 0.17 ng/mL

## 2018-10-25 MED ORDER — DOXYCYCLINE HYCLATE 100 MG PO TABS
100.0000 mg | ORAL_TABLET | Freq: Two times a day (BID) | ORAL | Status: DC
  Administered 2018-10-25 – 2018-10-28 (×7): 100 mg via ORAL
  Filled 2018-10-25 (×7): qty 1

## 2018-10-25 MED ORDER — POTASSIUM CHLORIDE CRYS ER 20 MEQ PO TBCR
40.0000 meq | EXTENDED_RELEASE_TABLET | Freq: Once | ORAL | Status: AC
  Administered 2018-10-25: 40 meq via ORAL
  Filled 2018-10-25: qty 2

## 2018-10-25 MED ORDER — FUROSEMIDE 10 MG/ML IJ SOLN
80.0000 mg | Freq: Two times a day (BID) | INTRAMUSCULAR | Status: AC
  Administered 2018-10-25 – 2018-10-27 (×5): 80 mg via INTRAVENOUS
  Filled 2018-10-25 (×5): qty 8

## 2018-10-25 NOTE — Progress Notes (Signed)
PT Cancellation Note  Patient Details Name: ROMEL DUMOND MRN: [id number] DOB: [birth year]   Cancelled Treatment:    Reason Eval/Treat Not Completed: Other (comment)(Pt just walked with Cardiac Rehab.  Will check back tomorrow)   Denice Paradise 10/25/2018, 2:52 PM Tarnisha Kachmar,PT Acute Rehabilitation Services Pager:  [phone]  Office:  [phone]

## 2018-10-25 NOTE — Progress Notes (Signed)
CARDIAC REHAB PHASE I   PRE:  Rate/Rhythm: 73 SR  BP:  Supine: 107/70  Sitting:   Standing:    SaO2: 95% 3.5L  MODE:  Ambulation: 140  ft   POST:  Rate/Rhythm: 80 SR  BP:  Supine: 133/92  Sitting:   Standing:    SaO2: 3L 87-88%     97% after resting in bed 1405-1440 Pt walked 140 ft on 3L with gait belt use, rolling walker and asst x 2. Pt had steady gait. Stopped once to rest due to SOB. Encouraged pursed lip breathing. Sats upon return to room low at 87-88%.  Pt sat on side and took deep breaths and sats up to 94% and eventually 97%. Pt would like to attend CRP 2 [place] again. He attended after his CABG. Pt had NSTEMI earlier admission. Will refer. Knows he cannot attend until completion of Rehab services. PT to see and recommend discharge plan.   Graylon Good, RN BSN  10/25/2018 2:36 PM

## 2018-10-25 NOTE — Progress Notes (Addendum)
Patient ID: Eric Gordon, male   DOB: 1931-02-21, 85 y.o.   MRN: [id number]     Advanced Heart Failure Rounding Note  PCP-Cardiologist: No primary care provider on file.   Subjective:    Patient re-developed chest pain (10/10) 1/30 early am, ECG with ST/T changes compared to prior (baseline RBBB).  Taken to cath lab 1/31 early morning, no significant change in coronary anatomy compared to prior cath earlier in the week.   Went for [place] 10/22/2018. mRCA 70% dRCA 70% FFR was not felt to be significant at 0.90 beyond the distal lesion and 0.89 beyond the mid lesion. Medical therapy recommended.   Trop 14.65 -> 11.97  PCT 0.17. CT chest with mild atx (versus infiltrate in LLL). No fever or productive cough. Continue IS.   WBC 13.6 -> 11.9 -> 11.1. Afebrile. No weight in chart. Negative 1.9 L.   Feeling OK this am. Appetite improving. Had mild chest tightness this am, but not severe and has resolved. Denies lightheadedness but hasn't been out of bed yet. Remains mildly dyspneic.   Studies:  Echo: EF 55-60%, inferolateral hypokinesis, normal RV, mild AS.   Cath: LIMA-LAD patent, SVG-D TO (old), SVG-PDA TO (old), sequential SVG-ramus and distal LCx with occluded limb to distal LCx (this was thought to be the culprit for NSTEMI earlier this week), 60-70% mRCA + 70% dRCA, 50% ostial LCx, 100% ostial ramus, LVEDP 20.   VQ scan 10/23/2018 low prob.   Objective:   Weight Range: 106.9 kg Body mass index is 29.46 kg/m.   Vital Signs:   Temp:  [97.5 F (36.4 C)-98.2 F (36.8 C)] 97.5 F (36.4 C) (02/03 0300) Pulse Rate:  [66-75] 66 (02/03 0300) Resp:  [16-35] 16 (02/03 0634) BP: (90-111)/(60-66) 100/66 (02/03 0300) SpO2:  [94 %-98 %] 98 % (02/03 0300) Last BM Date: 10/24/18  Weight change: Filed Weights   10/21/18 0522 10/23/18 1034  Weight: 106.9 kg 106.9 kg    Intake/Output:   Intake/Output Summary (Last 24 hours) at 10/25/2018 0806 Last data filed at 10/25/2018 0300 Gross  per 24 hour  Intake 118 ml  Output 2050 ml  Net -1932 ml      Physical Exam    General: NAD HEENT: Normal Neck: Supple. JVP appears at least 8-9 cm. Carotids 2+ bilat; no bruits. No thyromegaly or nodule noted. Cor: PMI nondisplaced. RRR, No M/G/R noted Lungs: CTAB, normal effort. Abdomen: Soft, non-tender, non-distended, no HSM. No bruits or masses. +BS  Extremities: No cyanosis, clubbing, or rash. R and LLE no edema.  Neuro: Alert & orientedx3, cranial nerves grossly intact. moves all 4 extremities w/o difficulty. Affect pleasant   Telemetry   NSR 70-80s with occasional PVCs, personally reviewed.   Labs    CBC Recent Labs    10/24/18 0318 10/25/18 0203  WBC 11.9* 11.1*  NEUTROABS 8.9* 7.7  HGB 11.1* 12.1*  HCT 34.3* 37.1*  MCV 94.8 95.4  PLT 140* 309   Basic Metabolic Panel Recent Labs    10/24/18 0318 10/25/18 0203  NA 135 140  K 4.1 3.8  CL 101 103  CO2 22 24  GLUCOSE 108* 100*  BUN 44* 48*  CREATININE 1.37* 1.49*  CALCIUM 8.7* 8.6*   Liver Function Tests No results for input(s): AST, ALT, ALKPHOS, BILITOT, PROT, ALBUMIN in the last 72 hours. No results for input(s): LIPASE, AMYLASE in the last 72 hours. Cardiac Enzymes Recent Labs    10/22/18 0820 10/22/18 1345  TROPONINI 14.65* 11.97*  BNP: BNP (last 3 results) Recent Labs    03/19/18 1335 10/21/18 0520  BNP 309.1* 720.2*    ProBNP (last 3 results) No results for input(s): PROBNP in the last 8760 hours.   D-Dimer No results for input(s): DDIMER in the last 72 hours. Hemoglobin A1C No results for input(s): HGBA1C in the last 72 hours. Fasting Lipid Panel No results for input(s): CHOL, HDL, LDLCALC, TRIG, CHOLHDL, LDLDIRECT in the last 72 hours. Thyroid Function Tests No results for input(s): TSH, T4TOTAL, T3FREE, THYROIDAB in the last 72 hours.  Invalid input(s): FREET3  Other results:   Imaging    Ct Chest Wo Contrast  Result Date: 10/24/2018 CLINICAL DATA:   Dyspnea, chest pain. EXAM: CT CHEST WITHOUT CONTRAST TECHNIQUE: Multidetector CT imaging of the chest was performed following the standard protocol without IV contrast. COMPARISON:  Radiograph of October 23, 2018. CT scan of June 24, 2011. FINDINGS: Cardiovascular: Atherosclerosis of thoracic aorta is noted. 4.4 cm ascending thoracic aortic aneurysm is noted. Status post coronary artery bypass graft. Small pericardial effusion is noted. Mild cardiomegaly is noted. Mediastinum/Nodes: No enlarged mediastinal or axillary lymph nodes. Thyroid gland, trachea, and esophagus demonstrate no significant findings. Lungs/Pleura: No pneumothorax is noted. Mild left pleural effusion is noted with adjacent atelectasis or infiltrate of the left lower lobe. Mild right basilar subsegmental atelectasis is noted. Upper Abdomen: No acute abnormality. Musculoskeletal: No chest wall mass or suspicious bone lesions identified. IMPRESSION: 4.4 cm ascending thoracic aortic aneurysm. Recommend annual imaging followup by CTA or MRA. This recommendation follows 2010 ACCF/AHA/AATS/ACR/ASA/SCA/SCAI/SIR/STS/SVM Guidelines for the Diagnosis and Management of Patients with Thoracic Aortic Disease. Circulation. 2010; 121: Y403-K742. Aortic aneurysm NOS (ICD10-I71.9). Small pericardial effusion is noted. Mild left basilar subsegmental atelectasis or pneumonia is noted with small left pleural effusion. Mild right basilar subsegmental atelectasis is noted. Aortic Atherosclerosis (ICD10-I70.0). Electronically Signed   By: Marijo Conception, M.D.   On: 10/24/2018 13:47     Medications:     Scheduled Medications: . aspirin  81 mg Oral Daily  . atorvastatin  80 mg Oral q1800  . carvedilol  3.125 mg Oral BID WC  . clopidogrel  75 mg Oral Q breakfast  . furosemide  80 mg Intravenous Daily  . heparin  5,000 Units Subcutaneous Q8H  . isosorbide mononitrate  90 mg Oral Daily  . ranolazine  500 mg Oral BID  . sodium chloride flush  3 mL  Intravenous Q12H    Infusions:   PRN Medications: acetaminophen, diazepam, morphine injection, ondansetron (ZOFRAN) IV   Assessment/Plan   1. CAD: s/p NSTEMI earlier this week, troponin up to 10.  Cath (1/27) showed occluded SVG-PDA but good flow down native RCA, occluded SVG-D with 70% D1 stenosis, sequential SVG-ramus and distal LCx with occluded limb to distal LCx => this was thought to be the culprit.  LIMA-LAD patent.  No interventional target. The patient was doing well at discharge on 1/28, then redeveloped severe chest pain early 1/30. ECG with ST-T changes.  Given severe ongoing pain, he was taken to cath lab with repeat cath 1/30 similar to 1/27 cath, no definite interventional target.  Troponin 13.8 => 14.8 => 12.1 => 13.2.  Still with occasional CP.    - FFR 1/31 of RCA non-significant at 0.90  - Continue atorvastatin 80 mg daily.  - Continue ASA 81 and continue Plavix 75 daily.  - Continue Coreg to 3.125 mg bid (slow titration given history of bradycardia).  - Cotninue Imdur to 90 mg  daily.  - Continue ranolazine, QTc in setting of wide RBBB stable at 503 msec.   2. Acute on chronic diastolic CHF: Echo (0/35) with EF 65-70%, suspect inferolateral hypokinesis, mild AS.  LVEDP 20 on cath.  - Volume status appears elevated on exam with JVD.  - Will continue IV Lasix, 80 mg IV bid today. Follow renal function as below.  3. Aortic stenosis: Mild on last echo.  4. CKD: Stage 3. Baseline Cr 1.0 - 1.2. - Cr up slightly 1.37 -> 1.49 with IV diuretics.  5. Dyspnea and CP - He is quite tachypneic and requires oxygen 2L by [place]. VQ 2/1 low prob (personally reviewed). pCXR ? LLL infiltrate per Radiology. - diuresis as above. - CT chest 10/24/2018 with atelectasis vs ? LLL PNA. Continue Incentive spirometer.  With WBCs only 11 and afebrile, PCT 0.17, suspect not PNA.  Coughing up yellow sputum this morning, will give course of doxycycline for acute bronchitis.  6. Dispo - Suspect will need  SNF. Will ask PT to see.   Length of Stay: [place], [place]  10/25/2018, 8:06 AM  Advanced Heart Failure Team Pager [phone] (M-F; 7a - 4p)  Please contact [facility] for night-coverage after hours (4p -7a ) and weekends on [email or web address]  Patient seen with PA, agree with the above note.  Oxygen saturation dropped to 88% when I took off his oxygen.  Still short of breath when he walks, occasional mild chest tightness.  He diuresed well yesterday with IV Lasix.   On exam, JVP 10-12 cm.  Mild crackles at lung bases.  Regular S1S2.  No edema.   I suspect that his main problem at this point is acute/chronic diastolic CHF.  He is volume overloaded on exam and oxygen saturation drops off supplemental oxygen. Dyspneic with exertion.  - Give Lasix 80 mg IV bid today, follow creatinine closely.   Continue current anti-anginal regimen.  Hopefully less chest tightness when he is better-diuresed.   WBCs 11, no fever, PCT not significantly elevated.  Suspect CT shows LLL atelectasis and not PNA.  However, given productive cough this morning will give him a course of doxycycline for acute bronchitis.   Mobilize, PT consult.  May need rehab placement.   [name] 10/25/2018 9:44 AM

## 2018-10-26 LAB — BASIC METABOLIC PANEL
Anion gap: 13 (ref 5–15)
BUN: 40 mg/dL — ABNORMAL HIGH (ref 8–23)
CO2: 26 mmol/L (ref 22–32)
Calcium: 8.6 mg/dL — ABNORMAL LOW (ref 8.9–10.3)
Chloride: 102 mmol/L (ref 98–111)
Creatinine, Ser: 1.51 mg/dL — ABNORMAL HIGH (ref 0.61–1.24)
GFR calc Af Amer: 48 mL/min — ABNORMAL LOW (ref >60)
GFR calc non Af Amer: 42 mL/min — ABNORMAL LOW (ref >60)
Glucose, Bld: 103 mg/dL — ABNORMAL HIGH (ref 70–99)
Potassium: 4.2 mmol/L (ref 3.5–5.1)
Sodium: 141 mmol/L (ref 135–145)

## 2018-10-26 LAB — CBC WITH DIFFERENTIAL/PLATELET
Abs Immature Granulocytes: 0.05 10*3/uL (ref 0.00–0.07)
BASOS ABS: 0.1 10*3/uL (ref 0.0–0.1)
Basophils Relative: 1 %
Eosinophils Absolute: 0.3 10*3/uL (ref 0.0–0.5)
Eosinophils Relative: 3 %
HCT: 36.8 % — ABNORMAL LOW (ref 39.0–52.0)
Hemoglobin: 11.6 g/dL — ABNORMAL LOW (ref 13.0–17.0)
Immature Granulocytes: 1 %
LYMPHS ABS: 1.6 10*3/uL (ref 0.7–4.0)
Lymphocytes Relative: 15 %
MCH: 29.9 pg (ref 26.0–34.0)
MCHC: 31.5 g/dL (ref 30.0–36.0)
MCV: 94.8 fL (ref 80.0–100.0)
Monocytes Absolute: 1.4 10*3/uL — ABNORMAL HIGH (ref 0.1–1.0)
Monocytes Relative: 13 %
Neutro Abs: 7.3 10*3/uL (ref 1.7–7.7)
Neutrophils Relative %: 67 %
Platelets: 187 10*3/uL (ref 150–400)
RBC: 3.88 MIL/uL — ABNORMAL LOW (ref 4.22–5.81)
RDW: 13.8 % (ref 11.5–15.5)
WBC: 10.7 10*3/uL — ABNORMAL HIGH (ref 4.0–10.5)
nRBC: 0 % (ref 0.0–0.2)

## 2018-10-26 LAB — PROCALCITONIN: Procalcitonin: 0.11 ng/mL

## 2018-10-26 NOTE — Evaluation (Signed)
Physical Therapy Evaluation Patient Details Name: Eric Gordon MRN: [id number] DOB: 10/24/1930 Today's Date: 10/26/2018   History of Present Illness  85 y.o. male with a history of CAD s/p CABG in 2012, chronic DOE, chronotropic incompenetence on beta blockers, carotid stenosis, who presents with chest pain.  Clinical Impression  Orders received for PT evaluation. Patient demonstrates deficits in functional mobility as indicated below. Will benefit from continued skilled PT to address deficits and maximize function. Will see as indicated and progress as tolerated.  Spoke with patient and wife regarding functional status and need for supplemental O2. At this time, patient with decreased activity tolerance and generalized weakness. Given current condition, feel patient would benefit from [place] SNF upon acute discharge.     Follow Up Recommendations SNF;Supervision for mobility/OOB    Equipment Recommendations  (TBD)    Recommendations for Other Services       Precautions / Restrictions Precautions Precautions: Fall Precaution Comments: watch O2 saturations      Mobility  Bed Mobility Overal bed mobility: Needs Assistance Bed Mobility: Supine to Sit     Supine to sit: Supervision     General bed mobility comments: increased time and effort to come to sitting  Transfers Overall transfer level: Needs assistance Equipment used: Rolling walker (2 wheeled) Transfers: Sit to/from Stand Sit to Stand: Min assist         General transfer comment: min assist for stability during power up, increased time and effort to perform  Ambulation/Gait Ambulation/Gait assistance: Min guard Gait Distance (Feet): 140 Feet(+20 ft in room) Assistive device: Rolling walker (2 wheeled) Gait Pattern/deviations: Step-through pattern;Decreased stride length;Narrow base of support;Drifts right/left Gait velocity: decreased   General Gait Details: patient required 2 standing rest breaks, O2  saturations to 86% on 3 liters, 90% on 3 liters with noted DOE and increased WOB  Stairs            Wheelchair Mobility    Modified Rankin (Stroke Patients Only)       Balance Overall balance assessment: Mild deficits observed, not formally tested                                           Pertinent Vitals/Pain Pain Assessment: No/denies pain    Home Living Family/patient expects to be discharged to:: Private residence Living Arrangements: Spouse/significant other Available Help at Discharge: Family;Available 24 hours/day Type of Home: House Home Access: Stairs to enter Entrance Stairs-Rails: None Entrance Stairs-Number of Steps: 4 Home Layout: Two level;Able to live on main level with bedroom/bathroom Home Equipment: Gilford Rile - 2 wheels;Cane - single point      Prior Function Level of Independence: Independent         Comments: Typically very active (drives, yard work, Research scientist (medical) at [employer]) and independent; intermittent debility due to LBP when overworked (has worked with OP PT for this; sees Systems developer). Was using SPC/RW immediately PTA due to fatigue/weakness     Hand Dominance   Dominant Hand: Right    Extremity/Trunk Assessment   Upper Extremity Assessment Upper Extremity Assessment: Generalized weakness    Lower Extremity Assessment Lower Extremity Assessment: Generalized weakness       Communication   Communication: No difficulties  Cognition Arousal/Alertness: Awake/alert Behavior During Therapy: WFL for tasks assessed/performed Overall Cognitive Status: Within Functional Limits for tasks assessed  General Comments      Exercises     Assessment/Plan    PT Assessment Patient needs continued PT services  PT Problem List Decreased strength;Decreased activity tolerance;Decreased balance;Decreased mobility       PT Treatment Interventions DME instruction;Gait  training;Stair training;Functional mobility training;Therapeutic activities;Therapeutic exercise;Balance training;Patient/family education    PT Goals (Current goals can be found in the Care Plan section)  Acute Rehab PT Goals Patient Stated Goal: to get stronger PT Goal Formulation: With patient/family Time For Goal Achievement: 11/09/18 Potential to Achieve Goals: Good    Frequency Min 3X/week   Barriers to discharge        Co-evaluation               AM-PAC PT "6 Clicks" Mobility  Outcome Measure Help needed turning from your back to your side while in a flat bed without using bedrails?: A Little Help needed moving from lying on your back to sitting on the side of a flat bed without using bedrails?: A Little Help needed moving to and from a bed to a chair (including a wheelchair)?: A Little Help needed standing up from a chair using your arms (e.g., wheelchair or bedside chair)?: A Little Help needed to walk in hospital room?: A Little Help needed climbing 3-5 steps with a railing? : A Lot 6 Click Score: 17    End of Session Equipment Utilized During Treatment: Gait belt;Oxygen Activity Tolerance: Patient tolerated treatment well Patient left: in chair;with call bell/phone within reach;with family/visitor present Nurse Communication: Mobility status PT Visit Diagnosis: Difficulty in walking, not elsewhere classified (R26.2)    Time: 5956-3875 PT Time Calculation (min) (ACUTE ONLY): 20 min   Charges:   PT Evaluation $PT Eval Moderate Complexity: 1 Mod          Alben Deeds, PT DPT  Board Certified Neurologic Specialist Acute Rehabilitation Services Pager [phone] Office [place] 10/26/2018, 8:48 AM

## 2018-10-26 NOTE — Clinical Social Work Placement (Signed)
   CLINICAL SOCIAL WORK PLACEMENT  NOTE  Date:  10/26/2018  Patient Details  Name: Eric Gordon MRN: [id number] Date of Birth: [birth year]  Clinical Social Work is seeking post-discharge placement for this patient at the [place] level of care (*CSW will initial, date and re-position this form in  chart as items are completed):      Patient/family provided with [facility] Work Department's list of facilities offering this level of care within the geographic area requested by the patient (or if unable, by the patient's family).      Patient/family informed of their freedom to choose among providers that offer the needed level of care, that participate in Medicare, Medicaid or managed care program needed by the patient, have an available bed and are willing to accept the patient.      Patient/family informed of [facility]'s ownership interest in [facility] and [facility], as well as of the fact that they are under no obligation to receive care at these facilities.  PASRR submitted to EDS on 10/26/18     PASRR number received on 10/26/18     Existing PASRR number confirmed on       FL2 transmitted to all facilities in geographic area requested by pt/family on 10/26/18     FL2 transmitted to all facilities within larger geographic area on       Patient informed that his/her managed care company has contracts with or will negotiate with certain facilities, including the following:            Patient/family informed of bed offers received.  Patient chooses bed at       Physician recommends and patient chooses bed at      Patient to be transferred to   on  .  Patient to be transferred to facility by       Patient family notified on   of transfer.  Name of family member notified:        PHYSICIAN Please sign FL2     Additional Comment:    _______________________________________________ Candie Chroman, LCSW 10/26/2018, 12:28  PM

## 2018-10-26 NOTE — Clinical Social Work Note (Signed)
Patient and his wife have accepted bed offer at [facility]. SNF admissions coordinator aware he could discharge as early as tomorrow.  Dayton Scrape, [place]

## 2018-10-26 NOTE — Progress Notes (Signed)
CARDIAC REHAB PHASE I   PRE:  Rate/Rhythm: 85 SR    BP: sitting 107/78    SaO2: 93 3 1/2L  MODE:  Ambulation: 270 ft   POST:  Rate/Rhythm: 95 SR    BP: sitting 113/75     SaO2: 96 3L  Pt able to get out of bed with min assist and ambulate with RW and 3L O2. Pt is stronger today, less SOB with distance. Return to bed for nap. SaO2 better with walking. Pt happy to ambulate. Wife present. [place], ACSM 10/26/2018 1:34 PM

## 2018-10-26 NOTE — Care Management Important Message (Signed)
Important Message  Patient Details  Name: Eric Gordon MRN: [id number] Date of Birth: 1931/09/09   Medicare Important Message Given:  Yes    Axiel Fjeld P Afua Hoots 10/26/2018, 5:06 PM

## 2018-10-26 NOTE — Clinical Social Work Note (Signed)
Clinical Social Work Assessment  Patient Details  Name: Eric Gordon MRN: [id number] Date of Birth: May 02, 1931  Date of referral:  10/26/18               Reason for consult:  Facility Placement, Discharge Planning                Permission sought to share information with:  Facility Sport and exercise psychologist, Family Supports Permission granted to share information::  Yes, Verbal Permission Granted  Name::     Harrell Niehoff  Agency::  SNF's  Relationship::  Wife  Contact Information:  [phone]  Housing/Transportation Living arrangements for the past 2 months:  Single Family Home Source of Information:  Patient, Medical Team, Spouse Patient Interpreter Needed:  None Criminal Activity/Legal Involvement Pertinent to Current Situation/Hospitalization:  No - Comment as needed Significant Relationships:  Adult Children, Spouse Lives with:  Spouse Do you feel safe going back to the place where you live?  Yes Need for family participation in patient care:  Yes (Comment)  Care giving concerns:  PT recommending SNF once medically stable.   Social Worker assessment / plan:  CSW met with patient. Wife at bedside. CSW introduced role and explained that PT recommendations would be discussed. Patient and his wife agreeable to SNF placement. First preference is [facility], second is [facility]. [facility] has a Cabin crew and will put him on the list for a private room once one is available if patient is interested. Will notify patient and update admissions coordinator. No further concerns. CSW encouraged patient and his wife to contact CSW as needed. CSW will continue to follow patient and his wife for support and facilitate discharge to SNF once medically stable.  Employment status:  Retired Forensic scientist:  Medicare PT Recommendations:  [place] / Referral to community resources:  [place]  Patient/Family's Response to care:  Patient and his wife agreeable to SNF placement. Patient's family supportive and involved in patient's care. Patient and his wife appreciated social work intervention.  Patient/Family's Understanding of and Emotional Response to Diagnosis, Current Treatment, and Prognosis:  Patient and his wife have a good understanding of the reason for admission and his need for rehab prior to returning home. Patient and his wife appear happy with hospital care.  Emotional Assessment Appearance:  Appears stated age Attitude/Demeanor/Rapport:  Engaged, Gracious Affect (typically observed):  Accepting, Appropriate, Calm, Pleasant Orientation:  Oriented to Self, Oriented to Place, Oriented to  Time, Oriented to Situation Alcohol / Substance use:  Never Used Psych involvement (Current and /or in the community):  No (Comment)  Discharge Needs  Concerns to be addressed:  Care Coordination Readmission within the last 30 days:  Yes Current discharge risk:  Dependent with Mobility Barriers to Discharge:  Continued Medical Work up   Candie Chroman, LCSW 10/26/2018, 12:25 PM

## 2018-10-26 NOTE — [place] FL2 (Signed)
[place] MEDICAID FL2 LEVEL OF CARE SCREENING TOOL     IDENTIFICATION  Patient Name: Eric Gordon Birthdate: Jun 02, 1931 Sex: male Admission Date (Current Location): 10/21/2018  [facility] and [place] Number:  Herbalist and Address:  [facility] [place], [place] [address] [place], [place] [address]      Provider Number: [id number]  Attending Physician Name and Address:  Troy Sine, MD  Relative Name and Phone Number:       Current Level of Care: Hospital Recommended Level of Care: [place] Prior Approval Number:    Date Approved/Denied:   PASRR Number: [id number]  Discharge Plan: SNF    Current Diagnoses: Patient Active Problem List   Diagnosis Date Noted  . NSTEMI (non-ST elevated myocardial infarction) ([place]) 10/21/2018  . Acute diastolic CHF (congestive heart failure) ([place])   . Non-ST elevation (NSTEMI) myocardial infarction ([place]) 10/17/2018  . CAP (community acquired pneumonia) 03/19/2018  . Hoarseness 12/15/2013  . Mild aortic stenosis 11/03/2012  . Atrial fibrillation ([place]) 04/02/2011  . Carotid stenosis, asymptomatic 04/02/2011  . Coronary artery disease 01/10/2011  . Abdominal aortic aneurysm ([place]) 11/06/2010  . CHEST TIGHTNESS-PRESSURE-OTHER 11/01/2010  . Asthma, mild intermittent 04/10/2010  . DYSPNEA 02/26/2010  . OBSTRUCTIVE SLEEP APNEA 08/22/2008  . Essential hypertension 08/22/2008  . BARRETTS ESOPHAGUS 08/18/2008  . HIATAL HERNIA 08/18/2008  . DIVERTICULOSIS, COLON 08/18/2008  . COLONIC POLYPS, HX OF 08/18/2008    Orientation RESPIRATION BLADDER Height & Weight     Self, Time, Situation, Place  O2(Nasal Canula 3 L) Continent Weight: 220 lb 10.9 oz (100.1 kg) Height:  6\' 3"  (190.5 cm)  BEHAVIORAL SYMPTOMS/MOOD NEUROLOGICAL BOWEL NUTRITION STATUS  (None) (None) Continent Diet  AMBULATORY STATUS COMMUNICATION OF NEEDS Skin   Limited Assist Verbally Normal                        Personal Care Assistance Level of Assistance  Bathing, Feeding, Dressing Bathing Assistance: Limited assistance Feeding assistance: Independent Dressing Assistance: Limited assistance     Functional Limitations Info  Sight, Hearing, Speech Sight Info: Adequate Hearing Info: Adequate Speech Info: Adequate    SPECIAL CARE FACTORS FREQUENCY  PT (By licensed PT)     PT Frequency: 5 x week              Contractures Contractures Info: Not present    Additional Factors Info  Code Status, Allergies Code Status Info: Full code Allergies Info: Ancef (Cefazolin Sodium), Vancomycin.           Current Medications (10/26/2018):  This is the current hospital active medication list Current Facility-Administered Medications  Medication Dose Route Frequency Provider Last Rate Last Dose  . acetaminophen (TYLENOL) tablet 650 mg  650 mg Oral Q4H PRN Troy Sine, MD   650 mg at 10/25/18 1011  . aspirin chewable tablet 81 mg  81 mg Oral Daily Troy Sine, MD   81 mg at 10/26/18 1324  . atorvastatin (LIPITOR) tablet 80 mg  80 mg Oral q1800 Troy Sine, MD   80 mg at 10/25/18 1727  . carvedilol (COREG) tablet 3.125 mg  3.125 mg Oral BID WC Bensimhon, Shaune Pascal, MD   3.125 mg at 10/26/18 0824  . clopidogrel (PLAVIX) tablet 75 mg  75 mg Oral Q breakfast Troy Sine, MD   75 mg at 10/26/18 4010  . diazepam (VALIUM) tablet 5 mg  5 mg Oral Q6H PRN Shelva Majestic  A, MD      . doxycycline (VIBRA-TABS) tablet 100 mg  100 mg Oral Q12H Larey Dresser, MD   100 mg at 10/26/18 0953  . furosemide (LASIX) injection 80 mg  80 mg Intravenous BID Larey Dresser, MD   80 mg at 10/26/18 0824  . heparin injection 5,000 Units  5,000 Units Subcutaneous Q8H Troy Sine, MD   5,000 Units at 10/26/18 [phone]  . isosorbide mononitrate (IMDUR) 24 hr tablet 90 mg  90 mg Oral Daily Larey Dresser, MD   90 mg at 10/26/18 5929  . morphine 2 MG/ML injection 1-2 mg  1-2 mg Intravenous Q4H PRN Larey Dresser, MD   2 mg at 10/22/18 1835  . ondansetron (ZOFRAN) injection 4 mg  4 mg Intravenous Q6H PRN Troy Sine, MD      . ranolazine (RANEXA) 12 hr tablet 500 mg  500 mg Oral BID Larey Dresser, MD   500 mg at 10/26/18 2446  . sodium chloride flush (NS) 0.9 % injection 3 mL  3 mL Intravenous Q12H Troy Sine, MD   3 mL at 10/26/18 2863     Discharge Medications: Please see discharge summary for a list of discharge medications.  Relevant Imaging Results:  Relevant Lab Results:   Additional Information SS#: [id number]  Candie Chroman, LCSW

## 2018-10-26 NOTE — Progress Notes (Addendum)
Patient ID: Eric Gordon, male   DOB: 1931-08-21, 85 y.o.   MRN: [id number]     Advanced Heart Failure Rounding Note  PCP-Cardiologist: No primary care provider on file.   Subjective:    Patient re-developed chest pain (10/10) 1/30 early am, ECG with ST/T changes compared to prior (baseline RBBB).  Taken to cath lab 1/31 early morning, no significant change in coronary anatomy compared to prior cath earlier in the week.   Went for [facility] 10/22/2018. mRCA 70% dRCA 70% FFR was not felt to be significant at 0.90 beyond the distal lesion and 0.89 beyond the mid lesion. Medical therapy recommended.   Trop 14.65 -> 11.97  PCT 0.17. CT chest with mild atx (versus infiltrate in LLL). No fever or productive cough. Continue IS.   WBC 13.6 -> 11.9 -> 11.1 -> 10.7. Afebrile. Started on doxy with yellow sputum. Negative 1.3 L. No weight this am.   Feeling somewhat better this am. Productive cough improved. Appetite improved.  Walked some with cardiac rehab, no PT assessment yet. Would be interested in HHPT if not candidate for SNF.   Studies:  Echo: EF 55-60%, inferolateral hypokinesis, normal RV, mild AS.   Cath: LIMA-LAD patent, SVG-D TO (old), SVG-PDA TO (old), sequential SVG-ramus and distal LCx with occluded limb to distal LCx (this was thought to be the culprit for NSTEMI earlier this week), 60-70% mRCA + 70% dRCA, 50% ostial LCx, 100% ostial ramus, LVEDP 20.   VQ scan 10/23/2018 low prob.   Objective:   Weight Range: 106.9 kg Body mass index is 29.46 kg/m.   Vital Signs:   Temp:  [97.6 F (36.4 C)-98.7 F (37.1 C)] 98.7 F (37.1 C) (02/04 0308) Pulse Rate:  [65-78] 65 (02/04 0308) Resp:  [20-29] 29 (02/04 0308) BP: (113-122)/(65-86) 114/74 (02/04 0308) SpO2:  [94 %-99 %] 94 % (02/04 0308) Last BM Date: 10/24/18  Weight change: Filed Weights   10/21/18 0522 10/23/18 1034  Weight: 106.9 kg 106.9 kg    Intake/Output:   Intake/Output Summary (Last 24 hours) at 10/26/2018  0742 Last data filed at 10/26/2018 0310 Gross per 24 hour  Intake 240 ml  Output 1550 ml  Net -1310 ml      Physical Exam    General: NAD HEENT: Normal Neck: Supple. JVP 8-9 cm. Carotids 2+ bilat; no bruits. No thyromegaly or nodule noted. Cor: PMI nondisplaced. RRR, No M/G/R noted Lungs: CTAB, normal effort. Abdomen: Soft, non-tender, non-distended, no HSM. No bruits or masses. +BS  Extremities: No cyanosis, clubbing, or rash. Trace ankle edema.  Neuro: Alert & orientedx3, cranial nerves grossly intact. moves all 4 extremities w/o difficulty. Affect pleasant   Telemetry   NSR 60-70s occasional PVCs, personally reviewed.   Labs    CBC Recent Labs    10/25/18 0203 10/26/18 0233  WBC 11.1* 10.7*  NEUTROABS 7.7 7.3  HGB 12.1* 11.6*  HCT 37.1* 36.8*  MCV 95.4 94.8  PLT 179 409   Basic Metabolic Panel Recent Labs    10/25/18 0203 10/26/18 0233  NA 140 141  K 3.8 4.2  CL 103 102  CO2 24 26  GLUCOSE 100* 103*  BUN 48* 40*  CREATININE 1.49* 1.51*  CALCIUM 8.6* 8.6*   Liver Function Tests No results for input(s): AST, ALT, ALKPHOS, BILITOT, PROT, ALBUMIN in the last 72 hours. No results for input(s): LIPASE, AMYLASE in the last 72 hours. Cardiac Enzymes No results for input(s): CKTOTAL, CKMB, CKMBINDEX, TROPONINI in the last 72 hours.  BNP: BNP (last 3 results) Recent Labs    03/19/18 1335 10/21/18 0520  BNP 309.1* 720.2*    ProBNP (last 3 results) No results for input(s): PROBNP in the last 8760 hours.   D-Dimer No results for input(s): DDIMER in the last 72 hours. Hemoglobin A1C No results for input(s): HGBA1C in the last 72 hours. Fasting Lipid Panel No results for input(s): CHOL, HDL, LDLCALC, TRIG, CHOLHDL, LDLDIRECT in the last 72 hours. Thyroid Function Tests No results for input(s): TSH, T4TOTAL, T3FREE, THYROIDAB in the last 72 hours.  Invalid input(s): FREET3  Other results:   Imaging    No results found.   Medications:      Scheduled Medications: . aspirin  81 mg Oral Daily  . atorvastatin  80 mg Oral q1800  . carvedilol  3.125 mg Oral BID WC  . clopidogrel  75 mg Oral Q breakfast  . doxycycline  100 mg Oral Q12H  . furosemide  80 mg Intravenous BID  . heparin  5,000 Units Subcutaneous Q8H  . isosorbide mononitrate  90 mg Oral Daily  . ranolazine  500 mg Oral BID  . sodium chloride flush  3 mL Intravenous Q12H    Infusions:   PRN Medications: acetaminophen, diazepam, morphine injection, ondansetron (ZOFRAN) IV   Assessment/Plan   1. CAD: s/p NSTEMI earlier this week, troponin up to 10.  Cath (1/27) showed occluded SVG-PDA but good flow down native RCA, occluded SVG-D with 70% D1 stenosis, sequential SVG-ramus and distal LCx with occluded limb to distal LCx => this was thought to be the culprit.  LIMA-LAD patent.  No interventional target. The patient was doing well at discharge on 1/28, then redeveloped severe chest pain early 1/30. ECG with ST-T changes.  Given severe ongoing pain, he was taken to cath lab with repeat cath 1/30 similar to 1/27 cath, no definite interventional target.  Troponin 13.8 => 14.8 => 12.1 => 13.2.  Still with occasional CP.    - FFR 1/31 of RCA non-significant at 0.90  - Continue atorvastatin 80 mg daily.  - Continue ASA 81 and continue Plavix 75 daily.  - Continue Coreg to 3.125 mg bid (slow titration given history of bradycardia).  - Continue Imdur 90 mg daily.  - Continue ranolazine, QTc in setting of wide RBBB stable at 513 msec yesterday (accept < 550 with RBBB).   2. Acute on chronic diastolic CHF: Echo (5/36) with EF 65-70%, suspect inferolateral hypokinesis, mild AS.  LVEDP 20 on cath.  - Volume status remains elevated. - Continue lasix 80 mg IV BID, at least this am.  3. Aortic stenosis: Mild on last echo. No change.  4. CKD: Stage 3. Baseline Cr 1.0 - 1.2. - Cr up slightly 1.37 -> 1.49 -> 1.51.  5. Dyspnea and CP - He is quite tachypneic and requires oxygen  2L by [place]. VQ 2/1 low prob (personally reviewed). pCXR ? LLL infiltrate per Radiology. - Diuresis as above.  - CT chest 10/24/2018 with atelectasis vs ? LLL PNA. Continue Incentive spirometer.  With WBCs only 11 and afebrile, PCT 0.17, suspect not PNA.   - Started on doxycycline for acute bronchitis.  - Continue current anti-anginal regimen.  6. Dispo - Walked with CR 10/25/2018. PT to evaluate.   Length of Stay: [address]  Annamaria Helling  10/26/2018, 7:42 AM  Advanced Heart Failure Team Pager [phone] (M-F; 7a - 4p)  Please contact [facility] for night-coverage after hours (4p -7a ) and weekends on  [email or web address]  Patient seen with PA, agree with the above note.  No chest pain.  Still requiring oxygen, mild dyspnea with walk yesterday. BUN/creatinine stable.   On exam, JVP 9-10 cm.  Mild crackles at lung bases.  Regular S1S2.  No edema.   I suspect that his main problem at this point is acute/chronic diastolic CHF.  He is volume overloaded on exam but this seems to be improving.  Weight is down considerably (221 lbs this morning). Dyspneic with exertion but also improving.  - Give Lasix 80 mg IV bid on more day, follow creatinine closely.   Continue current anti-anginal regimen.  Hopefully less chest tightness when he is better-diuresed.   WBCs 10.7, no fever, PCT not significantly elevated.  Suspect CT shows LLL atelectasis and not PNA. However, given productive cough, he is getting a course of doxycycline for acute bronchitis.   Mobilize, PT consult.  May need rehab placement.   [name] 10/26/2018 8:19 AM

## 2018-10-27 DIAGNOSIS — I480 Paroxysmal atrial fibrillation: Secondary | ICD-10-CM

## 2018-10-27 LAB — BASIC METABOLIC PANEL
Anion gap: 12 (ref 5–15)
BUN: 33 mg/dL — ABNORMAL HIGH (ref 8–23)
CALCIUM: 8.8 mg/dL — AB (ref 8.9–10.3)
CO2: 27 mmol/L (ref 22–32)
Chloride: 100 mmol/L (ref 98–111)
Creatinine, Ser: 1.45 mg/dL — ABNORMAL HIGH (ref 0.61–1.24)
GFR calc Af Amer: 51 mL/min — ABNORMAL LOW (ref >60)
GFR, EST NON AFRICAN AMERICAN: 44 mL/min — AB (ref >60)
Glucose, Bld: 106 mg/dL — ABNORMAL HIGH (ref 70–99)
Potassium: 3.5 mmol/L (ref 3.5–5.1)
Sodium: 139 mmol/L (ref 135–145)

## 2018-10-27 MED ORDER — FUROSEMIDE 40 MG PO TABS: ORAL_TABLET | ORAL | 5 refills | Status: DC

## 2018-10-27 MED ORDER — APIXABAN 5 MG PO TABS
5.0000 mg | ORAL_TABLET | Freq: Two times a day (BID) | ORAL | Status: DC
  Administered 2018-10-27 – 2018-10-28 (×3): 5 mg via ORAL
  Filled 2018-10-27 (×3): qty 1

## 2018-10-27 MED ORDER — POTASSIUM CHLORIDE ER 20 MEQ PO TBCR: 20.0000 meq | EXTENDED_RELEASE_TABLET | Freq: Every day | ORAL | 5 refills | Status: DC

## 2018-10-27 MED ORDER — ISOSORBIDE MONONITRATE ER 30 MG PO TB24: 90.0000 mg | ORAL_TABLET | Freq: Every day | ORAL | 5 refills | Status: DC

## 2018-10-27 MED ORDER — FUROSEMIDE 40 MG PO TABS
40.0000 mg | ORAL_TABLET | Freq: Two times a day (BID) | ORAL | Status: DC
  Administered 2018-10-27 – 2018-10-28 (×2): 40 mg via ORAL
  Filled 2018-10-27 (×2): qty 1

## 2018-10-27 MED ORDER — CARVEDILOL 3.125 MG PO TABS
3.1250 mg | ORAL_TABLET | Freq: Once | ORAL | Status: AC
  Administered 2018-10-27: 3.125 mg via ORAL
  Filled 2018-10-27: qty 1

## 2018-10-27 MED ORDER — DOXYCYCLINE HYCLATE 100 MG PO TABS: 100.0000 mg | ORAL_TABLET | Freq: Two times a day (BID) | ORAL | 0 refills | Status: AC

## 2018-10-27 MED ORDER — POTASSIUM CHLORIDE CRYS ER 20 MEQ PO TBCR
40.0000 meq | EXTENDED_RELEASE_TABLET | Freq: Once | ORAL | Status: AC
  Administered 2018-10-27: 40 meq via ORAL
  Filled 2018-10-27: qty 2

## 2018-10-27 MED ORDER — CARVEDILOL 3.125 MG PO TABS: 3.1250 mg | ORAL_TABLET | Freq: Two times a day (BID) | ORAL | 5 refills | Status: DC

## 2018-10-27 MED ORDER — CARVEDILOL 6.25 MG PO TABS
6.2500 mg | ORAL_TABLET | Freq: Two times a day (BID) | ORAL | Status: DC
  Administered 2018-10-27 – 2018-10-28 (×2): 6.25 mg via ORAL
  Filled 2018-10-27 (×2): qty 1

## 2018-10-27 MED ORDER — RANOLAZINE ER 500 MG PO TB12: 500.0000 mg | ORAL_TABLET | Freq: Two times a day (BID) | ORAL | 5 refills | Status: DC

## 2018-10-27 NOTE — Progress Notes (Addendum)
   Paged to see patient with 8 beats of NSVT and then a "rapid, irregular rhythm".   EKG performed which showed new atrial fibrilattion.   Tele reviewed. Pt went into Afib with RVR ~ 110-120 around 0838 am.    Discussed with Dr. Aundra Dubin.  STOP ASA and Plavix. START Eliquis 5 mg BID. Increase coreg to 6.25 mg BID. Will need to remain inpatient today.    Discussed with pt, his nurse, and SW who will notify facility.    Legrand Como [address] [place], PA-C 10/27/2018 10:48 AM   Agree with above note, new atrial fibrillation.  Mildly elevated rate in 100s currently.  Increase Coreg.  Will need to watch overnight.   [name] 10/27/2018

## 2018-10-27 NOTE — Progress Notes (Addendum)
Patient ID: Eric Gordon, male   DOB: 1931-02-21, 85 y.o.   MRN: [id number]     Advanced Heart Failure Rounding Note  PCP-Cardiologist: No primary care provider on file.   Subjective:    Patient re-developed chest pain (10/10) 1/30 early am, ECG with ST/T changes compared to prior (baseline RBBB).  Taken to cath lab 1/31 early morning, no significant change in coronary anatomy compared to prior cath earlier in the week.   Went for [facility] 10/22/2018. mRCA 70% dRCA 70% FFR was not felt to be significant at 0.90 beyond the distal lesion and 0.89 beyond the mid lesion. Medical therapy recommended.   Trop 14.65 -> 11.97  PCT 0.17. CT chest with mild atx (versus infiltrate in LLL). No fever or productive cough. Continue IS.   WBC 13.6 -> 11.9 -> 11.1 -> 10.7 10/26/18. Started on doxy with yellow sputum. Negative 2.1L. Weight down 3 lbs. If initial weight accurate, pt down nearly 20 lbs.   Feeling somewhat better this am. Productive cough improved. Appetite improved.  Walked some with cardiac rehab, no PT assessment yet. Would be interested in HHPT if not candidate for SNF.   Studies:  Echo: EF 55-60%, inferolateral hypokinesis, normal RV, mild AS.   Cath: LIMA-LAD patent, SVG-D TO (old), SVG-PDA TO (old), sequential SVG-ramus and distal LCx with occluded limb to distal LCx (this was thought to be the culprit for NSTEMI earlier this week), 60-70% mRCA + 70% dRCA, 50% ostial LCx, 100% ostial ramus, LVEDP 20.   VQ scan 10/23/2018 low prob.   Objective:   Weight Range: 98.6 kg Body mass index is 27.17 kg/m.   Vital Signs:   Temp:  [97.4 F (36.3 C)-98.6 F (37 C)] 97.4 F (36.3 C) (02/05 0348) Pulse Rate:  [71-76] 71 (02/05 0348) Resp:  [26-28] 26 (02/05 0348) BP: (93-107)/(67-80) 93/74 (02/05 0348) SpO2:  [90 %-94 %] 91 % (02/05 0348) Weight:  [98.6 kg-100.1 kg] 98.6 kg (02/05 0348) Last BM Date: 10/24/18  Weight change: Filed Weights   10/23/18 1034 10/26/18 0820 10/27/18  0348  Weight: 106.9 kg 100.1 kg 98.6 kg    Intake/Output:   Intake/Output Summary (Last 24 hours) at 10/27/2018 0733 Last data filed at 10/27/2018 0349 Gross per 24 hour  Intake -  Output 2195 ml  Net -2195 ml      Physical Exam    General: NAD HEENT: Normal Neck: Supple. JVP 8-9 cm. Carotids 2+ bilat; no bruits. No thyromegaly or nodule noted. Cor: PMI nondisplaced. RRR, No M/G/R noted Lungs: CTAB, normal effort. Abdomen: Soft, non-tender, non-distended, no HSM. No bruits or masses. +BS  Extremities: No cyanosis, clubbing, or rash. Trace ankle edema.  Neuro: Alert & orientedx3, cranial nerves grossly intact. moves all 4 extremities w/o difficulty. Affect pleasant   Telemetry   NSR 60-70s occasional PVCs, personally reviewed.   Labs    CBC Recent Labs    10/25/18 0203 10/26/18 0233  WBC 11.1* 10.7*  NEUTROABS 7.7 7.3  HGB 12.1* 11.6*  HCT 37.1* 36.8*  MCV 95.4 94.8  PLT 179 062   Basic Metabolic Panel Recent Labs    10/26/18 0233 10/27/18 0319  NA 141 139  K 4.2 3.5  CL 102 100  CO2 26 27  GLUCOSE 103* 106*  BUN 40* 33*  CREATININE 1.51* 1.45*  CALCIUM 8.6* 8.8*   Liver Function Tests No results for input(s): AST, ALT, ALKPHOS, BILITOT, PROT, ALBUMIN in the last 72 hours. No results for input(s): LIPASE, AMYLASE  in the last 72 hours. Cardiac Enzymes No results for input(s): CKTOTAL, CKMB, CKMBINDEX, TROPONINI in the last 72 hours.  BNP: BNP (last 3 results) Recent Labs    03/19/18 1335 10/21/18 0520  BNP 309.1* 720.2*    ProBNP (last 3 results) No results for input(s): PROBNP in the last 8760 hours.   D-Dimer No results for input(s): DDIMER in the last 72 hours. Hemoglobin A1C No results for input(s): HGBA1C in the last 72 hours. Fasting Lipid Panel No results for input(s): CHOL, HDL, LDLCALC, TRIG, CHOLHDL, LDLDIRECT in the last 72 hours. Thyroid Function Tests No results for input(s): TSH, T4TOTAL, T3FREE, THYROIDAB in the last 72  hours.  Invalid input(s): FREET3  Other results:   Imaging    No results found.   Medications:     Scheduled Medications: . aspirin  81 mg Oral Daily  . atorvastatin  80 mg Oral q1800  . carvedilol  3.125 mg Oral BID WC  . clopidogrel  75 mg Oral Q breakfast  . doxycycline  100 mg Oral Q12H  . furosemide  80 mg Intravenous BID  . heparin  5,000 Units Subcutaneous Q8H  . isosorbide mononitrate  90 mg Oral Daily  . ranolazine  500 mg Oral BID  . sodium chloride flush  3 mL Intravenous Q12H    Infusions:   PRN Medications: acetaminophen, diazepam, morphine injection, ondansetron (ZOFRAN) IV   Assessment/Plan   1. CAD: s/p NSTEMI earlier this week, troponin up to 10.  Cath (1/27) showed occluded SVG-PDA but good flow down native RCA, occluded SVG-D with 70% D1 stenosis, sequential SVG-ramus and distal LCx with occluded limb to distal LCx => this was thought to be the culprit.  LIMA-LAD patent.  No interventional target. The patient was doing well at discharge on 1/28, then redeveloped severe chest pain early 1/30. ECG with ST-T changes.  Given severe ongoing pain, he was taken to cath lab with repeat cath 1/30 similar to 1/27 cath, no definite interventional target.  Troponin 13.8 => 14.8 => 12.1 => 13.2.   - FFR 1/31 of RCA non-significant at 0.90  - Denies chest pain.  - Continue atorvastatin 80 mg daily.  - Continue ASA 81 and continue Plavix 75 daily.  - Continue Coreg to 3.125 mg bid (slow titration given history of bradycardia).  - Continue Imdur 90 mg daily.  - Continue ranolazine, QTc in setting of wide RBBB stable at 513 msec yesterday (accept < 550 with RBBB).   2. Acute on chronic diastolic CHF: Echo (8/29) with EF 65-70%, suspect inferolateral hypokinesis, mild AS.  LVEDP 20 on cath.  - Volume status remains at least slightly elevated.  - Give 80 mg IV lasix this am, then transition to po.  3. Aortic stenosis: Mild on last echo. No change.  4. CKD: Stage 3.  Baseline Cr 1.0 - 1.2. - Cr up slightly 1.37 -> 1.49 -> 1.51 -> 1.45. 5. Acute hypoxic respiratory failure - New O2 requirement. - VQ 2/1 low prob (personally reviewed). pCXR ? LLL infiltrate per Radiology. - Diuresis as above.  - CT chest 10/24/2018 with atelectasis vs ? LLL PNA. Continue Incentive spirometer.  With WBCs only 11 and afebrile, PCT 0.17, suspect not PNA.   - Remains on doxycycline for acute bronchitis.  6. Dispo - PT recommending SNF. Has accepted bed at [facility].   Doing well. Likely to SNF today if response well to dose of lasix this am.   Length of Stay: 6  Legrand Como  Joesph July, PA-C  10/27/2018, 7:33 AM  Advanced Heart Failure Team Pager [phone] (M-F; 7a - 4p)  Please contact [facility] for night-coverage after hours (4p -7a ) and weekends on [email or web address]  Patient seen with PA, agree with the above note.   He is doing well this morning.  Still on oxygen but weight down another 3 lbs.  Wants to go to [facility] rehab today.  Creatinine down to 1.49.  Breathing is comfortable.  No chest pain.   On exam, JVP 8 cm. Clear lungs.  No edema.  Regular S1S2.    I am going to give him 1 more dose of IV Lasix this morning then will transition to po Lasix and let him go to rehab facility.   He will need close followup in 10 days or so in CHF clinic, can overbook with me.  Meds for home: Lasix 40 mg po bid x 3 days then 40 mg po daily, KCl 20 daily, Imdur 90 daily, Coreg 3.125 mg bid, ranolazine 500 mg bid, ASA 81 daily, atorvastatin 80 daily, clopidogrel 75 daily, doxycycline 100 mg bid to complete 10 day course.   [name] 10/27/2018 8:19 AM

## 2018-10-27 NOTE — Progress Notes (Signed)
Physical Therapy Treatment Patient Details Name: Eric Gordon MRN: [id number] DOB: 09/01/1931 Today's Date: 10/27/2018    History of Present Illness 85 y.o. male with a history of CAD s/p CABG in 2012, chronic DOE, chronotropic incompenetence on beta blockers, carotid stenosis, who presents with chest pain.    PT Comments    Patient seem for mobility progression, tolerated activity well on supplemental O2. Continues to require use of RW for balance and stability with longer distance mobility. One episode of LOB requiring increased assist. Ambulated on 2 liters with saturations stable today and DOE 2/4. Current POC remains appropriate.   Follow Up Recommendations  SNF;Supervision for mobility/OOB     Equipment Recommendations  (TBD)    Recommendations for Other Services       Precautions / Restrictions Precautions Precautions: Fall Precaution Comments: watch O2 saturations    Mobility  Bed Mobility Overal bed mobility: Needs Assistance Bed Mobility: Supine to Sit     Supine to sit: Supervision     General bed mobility comments: increased time and effort to come to sitting  Transfers Overall transfer level: Needs assistance Equipment used: Rolling walker (2 wheeled) Transfers: Sit to/from Stand Sit to Stand: Min assist         General transfer comment: min assist to power up to standing with modest posterior list  Ambulation/Gait Ambulation/Gait assistance: Min guard(one episode of min assist for LOB) Gait Distance (Feet): 140 Feet Assistive device: Rolling walker (2 wheeled) Gait Pattern/deviations: Step-through pattern;Decreased stride length;Narrow base of support;Drifts right/left Gait velocity: decreased   General Gait Details: ambulated on 2 liters with O2 saturations >90% thorughout activity, HR stable. One standing rest break   Stairs             Wheelchair Mobility    Modified Rankin (Stroke Patients Only)       Balance Overall  balance assessment: Mild deficits observed, not formally tested                                          Cognition Arousal/Alertness: Awake/alert Behavior During Therapy: WFL for tasks assessed/performed Overall Cognitive Status: Within Functional Limits for tasks assessed                                        Exercises      General Comments        Pertinent Vitals/Pain Pain Assessment: No/denies pain    Home Living                      Prior Function            PT Goals (current goals can now be found in the care plan section) Acute Rehab PT Goals Patient Stated Goal: to get stronger PT Goal Formulation: With patient/family Time For Goal Achievement: 11/09/18 Potential to Achieve Goals: Good Progress towards PT goals: Progressing toward goals    Frequency    Min 3X/week      PT Plan Current plan remains appropriate    Co-evaluation              AM-PAC PT "6 Clicks" Mobility   Outcome Measure  Help needed turning from your back to your side while in a flat bed without using bedrails?: A Little Help  needed moving from lying on your back to sitting on the side of a flat bed without using bedrails?: A Little Help needed moving to and from a bed to a chair (including a wheelchair)?: A Little Help needed standing up from a chair using your arms (e.g., wheelchair or bedside chair)?: A Little Help needed to walk in hospital room?: A Little Help needed climbing 3-5 steps with a railing? : A Lot 6 Click Score: 17    End of Session Equipment Utilized During Treatment: Gait belt;Oxygen Activity Tolerance: Patient tolerated treatment well Patient left: in chair;with call bell/phone within reach;with family/visitor present Nurse Communication: Mobility status PT Visit Diagnosis: Difficulty in walking, not elsewhere classified (R26.2)     Time: 2924-4628 PT Time Calculation (min) (ACUTE ONLY): 15 min  Charges:   $Gait Training: 8-22 mins                     Alben Deeds, PT DPT  Board Certified Neurologic Specialist [place] Pager [phone] Office [place] 10/27/2018, 12:28 PM

## 2018-10-27 NOTE — Clinical Social Work Note (Signed)
SNF admissions coordinator aware of plan for discharge hopefully tomorrow.  Dayton Scrape, [place]

## 2018-10-27 NOTE — Consult Note (Signed)
   [facility] CM Inpatient Consult   10/27/2018  JEANLUC WEGMAN December 28, 1930 [id number]    Patient chart has been reviewed for readmissions less than 30 days and for medium risk score, 19%, for unplanned readmissions.   Chart review reveals patient to transfer to [facility] [place].  No [facility] identifiable needs at this time.   Netta Cedars, MSN, [facility] Liaison Nurse Mobile Phone [phone]  Toll free office [phone]

## 2018-10-27 NOTE — Discharge Summary (Addendum)
Advanced Heart Failure Discharge Note  Discharge Summary   Patient ID: Eric Gordon MRN: [id number], DOB/AGE: Mar 24, 1931 85 y.o. Admit date: 10/21/2018 D/C date:     10/28/2018   Primary Discharge Diagnoses:  1. CAD: s/p NSTEMI 2. Acute on chronic diastolic CHF 3. Aortic stenosis 4. CKD Stage 3 5. Acute hypoxic respiratory failure 6. New Afib  Hospital Course:   Eric Gordon is a 85 y.o. male with a history of CAD s/p CABG in 2012, chronic DOE, chronotropic incompenetence on beta blockers, carotid stenosis.   Presented with chest pain. Taken for cath as below. Medical therapy pursued.  Noted to have volume overload, so treated with IV lasix with ~ 12 lb diuresis from baseline. Also treated with doxycycline for acute bronchitis.   Planned for discharge 10/27/2018 but patient noted to go into new Afib with rates in 110s while prepping for d/c. ASA and Plavix stopped, started on Eliquis. Coreg increased. Pt went back into NSR after a couple of hours, and apart from one further breakthrough episode of 30-45 minutes afternoon of 10/27/2018, has remained in NSR.   1. CAD: s/p NSTEMI earlier this week, troponin up to 10. Cath (1/27) showed occluded SVG-PDA but good flow down native RCA, occluded SVG-D with 70% D1 stenosis, sequential SVG-ramus and distal LCx with occluded limb to distal LCx => this was thought to be the culprit. LIMA-LAD patent. No interventional target. The patient was doing well at discharge on 1/28, then redeveloped severe chest pain early 1/30. ECG with ST-T changes.  Given severe ongoing pain, he was taken to cath lab with repeat cath 1/30 similar to 1/27 cath, no definite interventional target.  Troponin 13.8 => 14.8 => 12.1 => 13.2.   - FFR 1/31 of RCA non-significant at 0.90  - Continue ranolazine, QTc in setting of wide RBBB stable at 513 msec (accept < 550 with RBBB).   2. Acute on chronic diastolic CHF: Echo (0/93) with EF 65-70%, suspect inferolateral  hypokinesis, mild AS.  LVEDP 20 on cath.  - Volume status remains at least slightly elevated.  - Give 80 mg IV lasix this am, then transition to po.  3. Aortic stenosis: Mild on last echo.  4. CKD: Stage 3. Baseline Cr 1.0 - 1.2. - Cr 1.45 on discharge. Has close follow up.  5. Acute hypoxic respiratory failure - New O2 requirement. - VQ 2/1 low prob (personally reviewed). pCXR ? LLL infiltrate per Radiology. - Diuresis as above.  - CT chest 10/24/2018 with atelectasis vs ? LLL PNA. Continue Incentive spirometer.  With WBCs only 11 and afebrile, PCT 0.17, suspect not PNA.   - Remains on doxycycline for acute bronchitis.  6. New Afib - This patients CHA2DS2-VASc Score is at least 5 - Started on Eliquis 5 mg BID. Watch renal function closely, if bumps to > 1.5, will need to decrease Eliquis.   He was examined am of 10/28/2018, and with restoration of NSR, was determined stable for discharge to SNF with close follow up as below.   Discharge Weight Range: 217 lbs Discharge Vitals: Blood pressure 102/67, pulse 67, temperature (!) 97.5 F (36.4 C), temperature source Oral, resp. rate (!) 29, height 6\' 3"  (1.905 m), weight 98.6 kg, SpO2 99 %.  Labs: Lab Results  Component Value Date   WBC 10.7 (H) 10/26/2018   HGB 11.6 (L) 10/26/2018   HCT 36.8 (L) 10/26/2018   MCV 94.8 10/26/2018   PLT 187 10/26/2018    Recent Labs  Lab 10/28/18 0134  NA 140  K 3.9  CL 101  CO2 28  BUN 32*  CREATININE 1.41*  CALCIUM 8.7*  GLUCOSE 107*   Lab Results  Component Value Date   CHOL 113 10/21/2018   HDL 35 (L) 10/21/2018   LDLCALC 62 10/21/2018   TRIG 82 10/21/2018   BNP (last 3 results) Recent Labs    03/19/18 1335 10/21/18 0520  BNP 309.1* 720.2*    ProBNP (last 3 results) No results for input(s): PROBNP in the last 8760 hours.   Diagnostic Studies/Procedures   No results found.  Discharge Medications   Allergies as of 10/28/2018      Reactions   Ancef [cefazolin Sodium] Hives    Vancomycin Rash      Medication List    STOP taking these medications   aspirin 81 MG tablet   clopidogrel 75 MG tablet Commonly known as:  PLAVIX   losartan 50 MG tablet Commonly known as:  COZAAR     TAKE these medications   acetaminophen 500 MG tablet Commonly known as:  TYLENOL Take 1,000 mg by mouth every 6 (six) hours as needed for mild pain.   apixaban 5 MG Tabs tablet Commonly known as:  ELIQUIS Take 1 tablet (5 mg total) by mouth 2 (two) times daily.   Ascorbic Acid 500 MG Caps Take 500 mg by mouth daily.   atorvastatin 80 MG tablet Commonly known as:  LIPITOR Take 1 tablet (80 mg total) by mouth daily.   carvedilol 6.25 MG tablet Commonly known as:  COREG Take 1 tablet (6.25 mg total) by mouth 2 (two) times daily with a meal.   CENTRUM SILVER PO Take 1 tablet by mouth daily.   cetirizine 10 MG tablet Commonly known as:  ZYRTEC Take 10 mg by mouth daily.   cholecalciferol 1000 units tablet Commonly known as:  VITAMIN D Take 1,000 Units by mouth daily.   doxycycline 100 MG tablet Commonly known as:  VIBRA-TABS Take 1 tablet (100 mg total) by mouth every 12 (twelve) hours for 7 days.   furosemide 40 MG tablet Commonly known as:  LASIX Take 40 mg (1 tab) twice daily x 2 days, then start 40 mg (1 tab) DAILY. Take additional tab as directed by HF clinic What changed:    medication strength  how much to take  how to take this  when to take this  additional instructions   isosorbide mononitrate 30 MG 24 hr tablet Commonly known as:  IMDUR Take 3 tablets (90 mg total) by mouth daily. What changed:  how much to take   levothyroxine 50 MCG tablet Commonly known as:  SYNTHROID, LEVOTHROID Take 50 mcg by mouth daily.   pantoprazole 40 MG tablet Commonly known as:  PROTONIX Take 40 mg by mouth daily.   Potassium Chloride ER 20 MEQ Tbcr Take 20 mEq by mouth daily. What changed:    medication strength  how much to take   ranolazine 500  MG 12 hr tablet Commonly known as:  RANEXA Take 1 tablet (500 mg total) by mouth 2 (two) times daily.   tamsulosin 0.4 MG Caps capsule Commonly known as:  FLOMAX Take 0.4 mg by mouth daily after supper.   triamcinolone cream 0.1 % Commonly known as:  KENALOG Apply 1 application topically daily as needed for rash.   vitamin E 400 UNIT capsule Take 400 Units by mouth daily.            Durable Medical  Equipment  (From admission, onward)         Start     Ordered   10/27/18 0922  For home use only DME oxygen  Once    Question Answer Comment  Mode or (Route) Nasal cannula   Liters per Minute 3   Frequency Continuous (stationary and portable oxygen unit needed)   Oxygen conserving device Yes   Oxygen delivery system Gas      10/27/18 0921          Disposition   The patient will be discharged in stable condition to SNF.  Discharge Instructions    (HEART FAILURE PATIENTS) Call MD:  Anytime you have any of the following symptoms: 1) 3 pound weight gain in 24 hours or 5 pounds in 1 week 2) shortness of breath, with or without a dry hacking cough 3) swelling in the hands, feet or stomach 4) if you have to sleep on extra pillows at night in order to breathe.   Complete by:  As directed    (HEART FAILURE PATIENTS) Call MD:  Anytime you have any of the following symptoms: 1) 3 pound weight gain in 24 hours or 5 pounds in 1 week 2) shortness of breath, with or without a dry hacking cough 3) swelling in the hands, feet or stomach 4) if you have to sleep on extra pillows at night in order to breathe.   Complete by:  As directed    Amb Referral to Cardiac Rehabilitation   Complete by:  As directed    NSTEMI admission earlier in week   Diagnosis:  NSTEMI   Diet - low sodium heart healthy   Complete by:  As directed    Diet - low sodium heart healthy   Complete by:  As directed    Increase activity slowly   Complete by:  As directed    Increase activity slowly   Complete by:   As directed    STOP any activity that causes chest pain, shortness of breath, dizziness, sweating, or exessive weakness   Complete by:  As directed    STOP any activity that causes chest pain, shortness of breath, dizziness, sweating, or exessive weakness   Complete by:  As directed       Contact information for follow-up providers    Larey Dresser, MD Follow up on 11/01/2018.   Specialty:  Cardiology Why:  at 0920 for post hospital follow up. The code for parking is 0227 Contact information: [place] [address] [phone]            Contact information for after-discharge care    Destination    [facility] Preferred SNF .   Service:  Skilled Nursing Contact information: [address] [place] [phone]                    Duration of Discharge Encounter: Greater than 35 minutes   Signed, Annamaria Helling 10/28/2018, 7:58 AM

## 2018-10-27 NOTE — Progress Notes (Signed)
Pt walked with PT about 1 hour ago, resting now. Ed completed with pt and wife. Good reception. Discussed MI, HF, daily wts, low sodium (covered HF booklet), Afib and gave booklet. Already referred to [facility]. Pt and wife are overwhelmed but receptive. Will f/u tomorrow for ambulation.  [place], ACSM 2:13 PM 10/27/2018

## 2018-10-28 DIAGNOSIS — K219 Gastro-esophageal reflux disease without esophagitis: Secondary | ICD-10-CM | POA: Diagnosis not present

## 2018-10-28 DIAGNOSIS — Z111 Encounter for screening for respiratory tuberculosis: Secondary | ICD-10-CM | POA: Diagnosis not present

## 2018-10-28 DIAGNOSIS — I130 Hypertensive heart and chronic kidney disease with heart failure and stage 1 through stage 4 chronic kidney disease, or unspecified chronic kidney disease: Secondary | ICD-10-CM | POA: Diagnosis not present

## 2018-10-28 DIAGNOSIS — I110 Hypertensive heart disease with heart failure: Secondary | ICD-10-CM | POA: Diagnosis not present

## 2018-10-28 DIAGNOSIS — E559 Vitamin D deficiency, unspecified: Secondary | ICD-10-CM | POA: Diagnosis not present

## 2018-10-28 DIAGNOSIS — G250 Essential tremor: Secondary | ICD-10-CM | POA: Diagnosis not present

## 2018-10-28 DIAGNOSIS — I714 Abdominal aortic aneurysm, without rupture: Secondary | ICD-10-CM | POA: Diagnosis not present

## 2018-10-28 DIAGNOSIS — I350 Nonrheumatic aortic (valve) stenosis: Secondary | ICD-10-CM | POA: Diagnosis not present

## 2018-10-28 DIAGNOSIS — I2510 Atherosclerotic heart disease of native coronary artery without angina pectoris: Secondary | ICD-10-CM | POA: Diagnosis not present

## 2018-10-28 DIAGNOSIS — R279 Unspecified lack of coordination: Secondary | ICD-10-CM | POA: Diagnosis not present

## 2018-10-28 DIAGNOSIS — J309 Allergic rhinitis, unspecified: Secondary | ICD-10-CM | POA: Diagnosis not present

## 2018-10-28 DIAGNOSIS — I480 Paroxysmal atrial fibrillation: Secondary | ICD-10-CM | POA: Diagnosis not present

## 2018-10-28 DIAGNOSIS — R262 Difficulty in walking, not elsewhere classified: Secondary | ICD-10-CM | POA: Diagnosis not present

## 2018-10-28 DIAGNOSIS — K5900 Constipation, unspecified: Secondary | ICD-10-CM | POA: Diagnosis not present

## 2018-10-28 DIAGNOSIS — Z951 Presence of aortocoronary bypass graft: Secondary | ICD-10-CM | POA: Diagnosis not present

## 2018-10-28 DIAGNOSIS — I4891 Unspecified atrial fibrillation: Secondary | ICD-10-CM | POA: Diagnosis not present

## 2018-10-28 DIAGNOSIS — I5032 Chronic diastolic (congestive) heart failure: Secondary | ICD-10-CM | POA: Diagnosis not present

## 2018-10-28 DIAGNOSIS — N400 Enlarged prostate without lower urinary tract symptoms: Secondary | ICD-10-CM | POA: Diagnosis not present

## 2018-10-28 DIAGNOSIS — Z87891 Personal history of nicotine dependence: Secondary | ICD-10-CM | POA: Diagnosis not present

## 2018-10-28 DIAGNOSIS — I10 Essential (primary) hypertension: Secondary | ICD-10-CM | POA: Diagnosis not present

## 2018-10-28 DIAGNOSIS — R600 Localized edema: Secondary | ICD-10-CM | POA: Diagnosis not present

## 2018-10-28 DIAGNOSIS — R52 Pain, unspecified: Secondary | ICD-10-CM | POA: Diagnosis not present

## 2018-10-28 DIAGNOSIS — R5381 Other malaise: Secondary | ICD-10-CM | POA: Diagnosis not present

## 2018-10-28 DIAGNOSIS — Z79899 Other long term (current) drug therapy: Secondary | ICD-10-CM | POA: Diagnosis not present

## 2018-10-28 DIAGNOSIS — E039 Hypothyroidism, unspecified: Secondary | ICD-10-CM | POA: Diagnosis not present

## 2018-10-28 DIAGNOSIS — R9431 Abnormal electrocardiogram [ECG] [EKG]: Secondary | ICD-10-CM | POA: Diagnosis not present

## 2018-10-28 DIAGNOSIS — Z743 Need for continuous supervision: Secondary | ICD-10-CM | POA: Diagnosis not present

## 2018-10-28 DIAGNOSIS — N183 Chronic kidney disease, stage 3 (moderate): Secondary | ICD-10-CM | POA: Diagnosis not present

## 2018-10-28 DIAGNOSIS — Z7901 Long term (current) use of anticoagulants: Secondary | ICD-10-CM | POA: Diagnosis not present

## 2018-10-28 DIAGNOSIS — J209 Acute bronchitis, unspecified: Secondary | ICD-10-CM | POA: Diagnosis not present

## 2018-10-28 DIAGNOSIS — I252 Old myocardial infarction: Secondary | ICD-10-CM | POA: Diagnosis not present

## 2018-10-28 DIAGNOSIS — I213 ST elevation (STEMI) myocardial infarction of unspecified site: Secondary | ICD-10-CM | POA: Diagnosis not present

## 2018-10-28 DIAGNOSIS — G4733 Obstructive sleep apnea (adult) (pediatric): Secondary | ICD-10-CM | POA: Diagnosis not present

## 2018-10-28 DIAGNOSIS — I5033 Acute on chronic diastolic (congestive) heart failure: Secondary | ICD-10-CM | POA: Diagnosis not present

## 2018-10-28 DIAGNOSIS — J45909 Unspecified asthma, uncomplicated: Secondary | ICD-10-CM | POA: Diagnosis not present

## 2018-10-28 DIAGNOSIS — M6281 Muscle weakness (generalized): Secondary | ICD-10-CM | POA: Diagnosis not present

## 2018-10-28 DIAGNOSIS — I5022 Chronic systolic (congestive) heart failure: Secondary | ICD-10-CM | POA: Diagnosis not present

## 2018-10-28 DIAGNOSIS — E785 Hyperlipidemia, unspecified: Secondary | ICD-10-CM | POA: Diagnosis not present

## 2018-10-28 DIAGNOSIS — Z79890 Hormone replacement therapy (postmenopausal): Secondary | ICD-10-CM | POA: Diagnosis not present

## 2018-10-28 DIAGNOSIS — Z8249 Family history of ischemic heart disease and other diseases of the circulatory system: Secondary | ICD-10-CM | POA: Diagnosis not present

## 2018-10-28 DIAGNOSIS — F419 Anxiety disorder, unspecified: Secondary | ICD-10-CM | POA: Diagnosis not present

## 2018-10-28 DIAGNOSIS — I214 Non-ST elevation (NSTEMI) myocardial infarction: Secondary | ICD-10-CM | POA: Diagnosis not present

## 2018-10-28 DIAGNOSIS — I6529 Occlusion and stenosis of unspecified carotid artery: Secondary | ICD-10-CM | POA: Diagnosis not present

## 2018-10-28 DIAGNOSIS — E569 Vitamin deficiency, unspecified: Secondary | ICD-10-CM | POA: Diagnosis not present

## 2018-10-28 DIAGNOSIS — I2129 ST elevation (STEMI) myocardial infarction involving other sites: Secondary | ICD-10-CM | POA: Diagnosis not present

## 2018-10-28 DIAGNOSIS — R001 Bradycardia, unspecified: Secondary | ICD-10-CM | POA: Diagnosis not present

## 2018-10-28 DIAGNOSIS — I4510 Unspecified right bundle-branch block: Secondary | ICD-10-CM | POA: Diagnosis not present

## 2018-10-28 DIAGNOSIS — I5031 Acute diastolic (congestive) heart failure: Secondary | ICD-10-CM | POA: Diagnosis not present

## 2018-10-28 LAB — BASIC METABOLIC PANEL
Anion gap: 11 (ref 5–15)
BUN: 32 mg/dL — ABNORMAL HIGH (ref 8–23)
CO2: 28 mmol/L (ref 22–32)
Calcium: 8.7 mg/dL — ABNORMAL LOW (ref 8.9–10.3)
Chloride: 101 mmol/L (ref 98–111)
Creatinine, Ser: 1.41 mg/dL — ABNORMAL HIGH (ref 0.61–1.24)
GFR calc Af Amer: 52 mL/min — ABNORMAL LOW (ref >60)
GFR calc non Af Amer: 45 mL/min — ABNORMAL LOW (ref >60)
GLUCOSE: 107 mg/dL — AB (ref 70–99)
POTASSIUM: 3.9 mmol/L (ref 3.5–5.1)
Sodium: 140 mmol/L (ref 135–145)

## 2018-10-28 LAB — MAGNESIUM: Magnesium: 1.9 mg/dL (ref 1.7–2.4)

## 2018-10-28 MED ORDER — CARVEDILOL 6.25 MG PO TABS: 6.2500 mg | ORAL_TABLET | Freq: Two times a day (BID) | ORAL | 5 refills | Status: DC

## 2018-10-28 MED ORDER — APIXABAN 5 MG PO TABS: 5.0000 mg | ORAL_TABLET | Freq: Two times a day (BID) | ORAL | 5 refills | Status: DC

## 2018-10-28 NOTE — Progress Notes (Addendum)
Patient ID: Eric Gordon, male   DOB: 06-09-1931, 85 y.o.   MRN: [id number]     Advanced Heart Failure Rounding Note  PCP-Cardiologist: No primary care provider on file.   Subjective:    Patient re-developed chest pain (10/10) 1/30 early am, ECG with ST/T changes compared to prior (baseline RBBB).  Taken to cath lab 1/31 early morning, no significant change in coronary anatomy compared to prior cath earlier in the week.   Went for [facility] 10/22/2018. mRCA 70% dRCA 70% FFR was not felt to be significant at 0.90 beyond the distal lesion and 0.89 beyond the mid lesion. Medical therapy recommended.   Trop 14.65 -> 11.97  PCT 0.17. CT chest with mild atx (versus infiltrate in LLL). No fever or productive cough. Continue IS.   To complete course of doxy for bronchitis. No weight today. Cr stable on po diuretics.   Had planned discharge 10/27/2018 but pt went into new Afib. Coreg increased and started on Eliquis. He is back in NSR this am.   Feeling better this am. Anxious to get out of the hospital. No SOB at rest. Still mild SOB with exertion. Remains on O2.   Studies:  Echo: EF 55-60%, inferolateral hypokinesis, normal RV, mild AS.   Cath: LIMA-LAD patent, SVG-D TO (old), SVG-PDA TO (old), sequential SVG-ramus and distal LCx with occluded limb to distal LCx (this was thought to be the culprit for NSTEMI earlier this week), 60-70% mRCA + 70% dRCA, 50% ostial LCx, 100% ostial ramus, LVEDP 20.   VQ scan 10/23/2018 low prob.   Objective:   Weight Range: 98.6 kg Body mass index is 27.17 kg/m.   Vital Signs:   Temp:  [97.6 F (36.4 C)-98 F (36.7 C)] 97.8 F (36.6 C) (02/06 0423) Pulse Rate:  [57-105] 67 (02/06 0628) Resp:  [19-32] 29 (02/06 0423) BP: (98-124)/(65-76) 102/67 (02/06 0423) SpO2:  [93 %-99 %] 99 % (02/06 0423) FiO2 (%):  [3 %] 3 % (02/06 0423) Last BM Date: 10/25/18  Weight change: Filed Weights   10/23/18 1034 10/26/18 0820 10/27/18 0348  Weight: 106.9 kg  100.1 kg 98.6 kg    Intake/Output:   Intake/Output Summary (Last 24 hours) at 10/28/2018 0737 Last data filed at 10/27/2018 2347 Gross per 24 hour  Intake 1020 ml  Output 1750 ml  Net -730 ml      Physical Exam    General: NAD HEENT: Normal Neck: Supple. JVP 7-8 cm. Carotids 2+ bilat; no bruits. No thyromegaly or nodule noted. Cor: PMI nondisplaced. Regular, slightly brady.  Lungs: CTAB, normal effort. Abdomen: Soft, non-tender, non-distended, no HSM. No bruits or masses. +BS  Extremities: No cyanosis, clubbing, or rash. Trace ankle edema.  Neuro: Alert & orientedx3, cranial nerves grossly intact. moves all 4 extremities w/o difficulty. Affect pleasant   Telemetry   NSR 60s this am, He was in Afib for about 2 hrs yesterday, with another short breakthrough episode in the afternoon, but has been back in NSR for the most part, personally reviewed.   Labs    CBC Recent Labs    10/26/18 0233  WBC 10.7*  NEUTROABS 7.3  HGB 11.6*  HCT 36.8*  MCV 94.8  PLT 876   Basic Metabolic Panel Recent Labs    10/27/18 0319 10/28/18 0134  NA 139 140  K 3.5 3.9  CL 100 101  CO2 27 28  GLUCOSE 106* 107*  BUN 33* 32*  CREATININE 1.45* 1.41*  CALCIUM 8.8* 8.7*  MG  --  1.9   Liver Function Tests No results for input(s): AST, ALT, ALKPHOS, BILITOT, PROT, ALBUMIN in the last 72 hours. No results for input(s): LIPASE, AMYLASE in the last 72 hours. Cardiac Enzymes No results for input(s): CKTOTAL, CKMB, CKMBINDEX, TROPONINI in the last 72 hours.  BNP: BNP (last 3 results) Recent Labs    03/19/18 1335 10/21/18 0520  BNP 309.1* 720.2*    ProBNP (last 3 results) No results for input(s): PROBNP in the last 8760 hours.   D-Dimer No results for input(s): DDIMER in the last 72 hours. Hemoglobin A1C No results for input(s): HGBA1C in the last 72 hours. Fasting Lipid Panel No results for input(s): CHOL, HDL, LDLCALC, TRIG, CHOLHDL, LDLDIRECT in the last 72 hours. Thyroid  Function Tests No results for input(s): TSH, T4TOTAL, T3FREE, THYROIDAB in the last 72 hours.  Invalid input(s): FREET3  Other results:   Imaging    No results found.   Medications:     Scheduled Medications: . apixaban  5 mg Oral BID  . atorvastatin  80 mg Oral q1800  . carvedilol  6.25 mg Oral BID WC  . doxycycline  100 mg Oral Q12H  . furosemide  40 mg Oral BID  . isosorbide mononitrate  90 mg Oral Daily  . ranolazine  500 mg Oral BID  . sodium chloride flush  3 mL Intravenous Q12H    Infusions:   PRN Medications: acetaminophen, diazepam, morphine injection, ondansetron (ZOFRAN) IV   Assessment/Plan   1. CAD: s/p NSTEMI earlier this week, troponin up to 10.  Cath (1/27) showed occluded SVG-PDA but good flow down native RCA, occluded SVG-D with 70% D1 stenosis, sequential SVG-ramus and distal LCx with occluded limb to distal LCx => this was thought to be the culprit.  LIMA-LAD patent.  No interventional target. The patient was doing well at discharge on 1/28, then redeveloped severe chest pain early 1/30. ECG with ST-T changes.  Given severe ongoing pain, he was taken to cath lab with repeat cath 1/30 similar to 1/27 cath, no definite interventional target.  Troponin 13.8 => 14.8 => 12.1 => 13.2.   - FFR 1/31 of RCA non-significant at 0.90  - No further chest pain.  - Continue atorvastatin 80 mg daily.  - Now off ASA and plavix on Eliquis as below.  - Continue coreg 6.25 mg BID for now. Watch HR.  - Continue Imdur 90 mg daily.  - Continue ranolazine, QTc in setting of wide RBBB stable at 513 msec on last ECG (accept < 550 with RBBB).   2. Acute on chronic diastolic CHF: Echo (2/54) with EF 65-70%, suspect inferolateral hypokinesis, mild AS.  LVEDP 20 on cath.  - Volume status improved.  - Continue lasix 40 mg po BID x 2 more days, then to 40 mg daily.  3. Aortic stenosis: Mild on last echo. No change.  4. CKD: Stage 3. Baseline Cr 1.0 - 1.2. - Cr trending down  slightly 1.37 -> 1.49 -> 1.51 -> 1.45 -> 1.41 5. Acute hypoxic respiratory failure - New O2 requirement. - VQ 2/1 low prob (personally reviewed). pCXR ? LLL infiltrate per Radiology. - Diuresis as above.  - CT chest 10/24/2018 with atelectasis vs ? LLL PNA. Continue Incentive spirometer.  With WBCs only 11 and afebrile, PCT 0.17, suspect not PNA.   - to complete 10 day course of doxycycline for acute bronchitis.  6. New Afib - First noted 10/27/2018 - Started on Eliquis 5 mg BID. Watch renal function. If  Cr goes back above 1.5 will need to decrease dosing.  - This patients CHA2DS2-VASc Score is at least 5 (Age+2, CHF, HTN, and vascular disease)  7. Dispo - PT recommending SNF. Has accepted bed at [facility].  - Plan discharge today with close follow up.   Length of Stay: [address]  Annamaria Helling  10/28/2018, 7:37 AM  Advanced Heart Failure Team Pager [phone] (M-F; 7a - 4p)  Please contact [facility] for night-coverage after hours (4p -7a ) and weekends on [email or web address]  Patient seen with PA, agree with the above note.  He went into atrial fibrillation yesterday, so discharge was delayed.  ASA/Plavix stopped and Eliquis started at 5 mg bid.  He converted back to NSR and is in NSR this morning.  He feels good and is ready for discharge today.    Will let him go to SNF on medical regimen planned yesterday with the exception of stopping ASA/Plavix and starting Eliquis 5 mg bid.  Will have to follow renal function closely, if creatinine increases to > 1.5 will need to decrease to 2.5 mg bid.   [name] 10/28/2018 8:05 AM

## 2018-10-28 NOTE — Progress Notes (Addendum)
Attempted to contact [facility] to call report for pt going to room 612A.  Awaiting callback. [phone] Received callback from Lelon Frohlich at [facility] who took report for pt. Advised awaiting PTAR pickup.

## 2018-10-28 NOTE — Plan of Care (Signed)
  Problem: Education: Goal: Understanding of cardiac disease, CV risk reduction, and recovery process will improve Outcome: Progressing Goal: Understanding of medication regimen will improve Outcome: Progressing Goal: Individualized Educational Video(s) Outcome: Progressing   Problem: Activity: Goal: Ability to tolerate increased activity will improve Outcome: Progressing   Problem: Cardiac: Goal: Ability to achieve and maintain adequate cardiopulmonary perfusion will improve Outcome: Progressing Goal: Vascular access site(s) Level 0-1 will be maintained Outcome: Progressing   Problem: Health Behavior/Discharge Planning: Goal: Ability to safely manage health-related needs after discharge will improve Outcome: Progressing   Problem: Education: Goal: Knowledge of General Education information will improve Description Including pain rating scale, medication(s)/side effects and non-pharmacologic comfort measures Outcome: Progressing   Problem: Health Behavior/Discharge Planning: Goal: Ability to manage health-related needs will improve Outcome: Progressing   Problem: Clinical Measurements: Goal: Ability to maintain clinical measurements within normal limits will improve Outcome: Progressing Goal: Will remain free from infection Outcome: Progressing Goal: Diagnostic test results will improve Outcome: Progressing Goal: Respiratory complications will improve Outcome: Progressing Goal: Cardiovascular complication will be avoided Outcome: Progressing   Problem: Activity: Goal: Risk for activity intolerance will decrease Outcome: Progressing   Problem: Nutrition: Goal: Adequate nutrition will be maintained Outcome: Progressing   Problem: Coping: Goal: Level of anxiety will decrease Outcome: Progressing   Problem: Elimination: Goal: Will not experience complications related to bowel motility Outcome: Progressing Goal: Will not experience complications related to  urinary retention Outcome: Progressing   Problem: Pain Managment: Goal: General experience of comfort will improve Outcome: Progressing   Problem: Skin Integrity: Goal: Risk for impaired skin integrity will decrease Outcome: Progressing   Problem: Education: Goal: Understanding of CV disease, CV risk reduction, and recovery process will improve Outcome: Progressing Goal: Individualized Educational Video(s) Outcome: Progressing   Problem: Activity: Goal: Ability to return to baseline activity level will improve Outcome: Progressing   Problem: Cardiovascular: Goal: Ability to achieve and maintain adequate cardiovascular perfusion will improve Outcome: Progressing Goal: Vascular access site(s) Level 0-1 will be maintained Outcome: Progressing   Problem: Health Behavior/Discharge Planning: Goal: Ability to safely manage health-related needs after discharge will improve Outcome: Progressing

## 2018-10-28 NOTE — Clinical Social Work Placement (Signed)
   CLINICAL SOCIAL WORK PLACEMENT  NOTE  Date:  10/28/2018  Patient Details  Name: Eric Gordon MRN: [id number] Date of Birth: 06/20/1931  Clinical Social Work is seeking post-discharge placement for this patient at the [place] level of care (*CSW will initial, date and re-position this form in  chart as items are completed):      Patient/family provided with [facility] Work Department's list of facilities offering this level of care within the geographic area requested by the patient (or if unable, by the patient's family).      Patient/family informed of their freedom to choose among providers that offer the needed level of care, that participate in Medicare, Medicaid or managed care program needed by the patient, have an available bed and are willing to accept the patient.      Patient/family informed of [facility]'s ownership interest in [facility] and [facility], as well as of the fact that they are under no obligation to receive care at these facilities.  PASRR submitted to EDS on 10/26/18     PASRR number received on 10/26/18     Existing PASRR number confirmed on       FL2 transmitted to all facilities in geographic area requested by pt/family on 10/26/18     FL2 transmitted to all facilities within larger geographic area on       Patient informed that his/her managed care company has contracts with or will negotiate with certain facilities, including the following:        Yes   Patient/family informed of bed offers received.  Patient chooses bed at [facility]     Physician recommends and patient chooses bed at      Patient to be transferred to [facility] on 10/28/18.  Patient to be transferred to facility by PTAR     Patient family notified on 10/28/18 of transfer.  Name of family member notified:  Nicoli Nardozzi     PHYSICIAN Please prepare prescriptions     Additional Comment:     _______________________________________________ Candie Chroman, LCSW 10/28/2018, 9:24 AM

## 2018-10-28 NOTE — Progress Notes (Addendum)
Discharge instructions given verbally to patient and wife who is at bedside. Paper copy given to wife and copy being sent to [facility] with [place].  No further questions.  VSS at discharge. Pt belongings being sent with patient family. IV removed per orders. Awaiting PTAR pickup.  RN will continue to monitor. 1315 Patient discharge by PTAR.  Copy of AVS sent with patient and copy given to spouse.

## 2018-10-28 NOTE — Progress Notes (Signed)
CARDIAC REHAB PHASE I   PRE:  Rate/Rhythm: 60 SR    BP: sitting 100/81    SaO2: 97 3L  MODE:  Ambulation: 270 ft   POST:  Rate/Rhythm: 72 SR    BP: sitting 115/70     SaO2: 94 2L, up to 100 2L, 90 RA  Pt steady walking with RW. Used 2L O2, light assist. Fatigue with distance. SOB once sitting. SaO2 94 on 2L and increased with sitting. I d/c'd O2 to try RA. After 10 min he was down to 89-91 RA. Left O2 off but notified RN. Pt practiced IS, which he is doing well on, consistently 1500 mL with correct mechanics. Answered sodium and fluid questions.  [phone]  [place], ACSM 10/28/2018 9:05 AM

## 2018-10-28 NOTE — Clinical Social Work Note (Signed)
CSW facilitated patient discharge including contacting patient family and facility to confirm patient discharge plans. Clinical information faxed to facility and family agreeable with plan. CSW arranged ambulance transport via PT[facility] to [facility] at 11:30 am. RN to call report prior to discharge [phone] Room 612A).  CSW will sign off for now as social work intervention is no longer needed. Please consult [place] again if new needs arise.  [place] Scrape, [place]

## 2018-10-29 DIAGNOSIS — E785 Hyperlipidemia, unspecified: Secondary | ICD-10-CM | POA: Diagnosis not present

## 2018-10-29 DIAGNOSIS — E559 Vitamin D deficiency, unspecified: Secondary | ICD-10-CM | POA: Diagnosis not present

## 2018-10-29 DIAGNOSIS — E039 Hypothyroidism, unspecified: Secondary | ICD-10-CM | POA: Diagnosis not present

## 2018-10-29 DIAGNOSIS — K219 Gastro-esophageal reflux disease without esophagitis: Secondary | ICD-10-CM | POA: Diagnosis not present

## 2018-10-29 DIAGNOSIS — R52 Pain, unspecified: Secondary | ICD-10-CM | POA: Diagnosis not present

## 2018-10-29 DIAGNOSIS — J209 Acute bronchitis, unspecified: Secondary | ICD-10-CM | POA: Diagnosis not present

## 2018-10-29 DIAGNOSIS — M6281 Muscle weakness (generalized): Secondary | ICD-10-CM | POA: Diagnosis not present

## 2018-10-29 DIAGNOSIS — I214 Non-ST elevation (NSTEMI) myocardial infarction: Secondary | ICD-10-CM | POA: Diagnosis not present

## 2018-10-29 DIAGNOSIS — R600 Localized edema: Secondary | ICD-10-CM | POA: Diagnosis not present

## 2018-10-29 DIAGNOSIS — I10 Essential (primary) hypertension: Secondary | ICD-10-CM | POA: Diagnosis not present

## 2018-10-29 DIAGNOSIS — I4891 Unspecified atrial fibrillation: Secondary | ICD-10-CM | POA: Diagnosis not present

## 2018-10-29 DIAGNOSIS — J309 Allergic rhinitis, unspecified: Secondary | ICD-10-CM | POA: Diagnosis not present

## 2018-11-01 ENCOUNTER — Ambulatory Visit (HOSPITAL_COMMUNITY)
Admission: RE | Admit: 2018-11-01 | Discharge: 2018-11-01 | Disposition: A | Discharge status: Home / Self Care | Payer: No Typology Code available for payment source | Source: Ambulatory Visit | Attending: Cardiology | Admitting: Cardiology

## 2018-11-01 ENCOUNTER — Encounter (HOSPITAL_COMMUNITY): Payer: Self-pay | Admitting: Cardiology

## 2018-11-01 VITALS — BP 116/78 | HR 95 | Wt 223.8 lb

## 2018-11-01 DIAGNOSIS — I10 Essential (primary) hypertension: Secondary | ICD-10-CM | POA: Diagnosis not present

## 2018-11-01 DIAGNOSIS — I4891 Unspecified atrial fibrillation: Secondary | ICD-10-CM | POA: Diagnosis not present

## 2018-11-01 DIAGNOSIS — I2510 Atherosclerotic heart disease of native coronary artery without angina pectoris: Secondary | ICD-10-CM | POA: Diagnosis not present

## 2018-11-01 DIAGNOSIS — Z8249 Family history of ischemic heart disease and other diseases of the circulatory system: Secondary | ICD-10-CM | POA: Insufficient documentation

## 2018-11-01 DIAGNOSIS — E785 Hyperlipidemia, unspecified: Secondary | ICD-10-CM | POA: Insufficient documentation

## 2018-11-01 DIAGNOSIS — I5032 Chronic diastolic (congestive) heart failure: Secondary | ICD-10-CM | POA: Insufficient documentation

## 2018-11-01 DIAGNOSIS — I4510 Unspecified right bundle-branch block: Secondary | ICD-10-CM | POA: Insufficient documentation

## 2018-11-01 DIAGNOSIS — E039 Hypothyroidism, unspecified: Secondary | ICD-10-CM | POA: Diagnosis not present

## 2018-11-01 DIAGNOSIS — K219 Gastro-esophageal reflux disease without esophagitis: Secondary | ICD-10-CM | POA: Diagnosis not present

## 2018-11-01 DIAGNOSIS — J309 Allergic rhinitis, unspecified: Secondary | ICD-10-CM | POA: Diagnosis not present

## 2018-11-01 DIAGNOSIS — E559 Vitamin D deficiency, unspecified: Secondary | ICD-10-CM | POA: Diagnosis not present

## 2018-11-01 DIAGNOSIS — I110 Hypertensive heart disease with heart failure: Secondary | ICD-10-CM | POA: Insufficient documentation

## 2018-11-01 DIAGNOSIS — I350 Nonrheumatic aortic (valve) stenosis: Secondary | ICD-10-CM | POA: Insufficient documentation

## 2018-11-01 DIAGNOSIS — G250 Essential tremor: Secondary | ICD-10-CM | POA: Diagnosis not present

## 2018-11-01 DIAGNOSIS — Z951 Presence of aortocoronary bypass graft: Secondary | ICD-10-CM | POA: Diagnosis not present

## 2018-11-01 DIAGNOSIS — I480 Paroxysmal atrial fibrillation: Secondary | ICD-10-CM | POA: Diagnosis not present

## 2018-11-01 DIAGNOSIS — R52 Pain, unspecified: Secondary | ICD-10-CM | POA: Diagnosis not present

## 2018-11-01 DIAGNOSIS — I214 Non-ST elevation (NSTEMI) myocardial infarction: Secondary | ICD-10-CM | POA: Diagnosis not present

## 2018-11-01 DIAGNOSIS — Z79899 Other long term (current) drug therapy: Secondary | ICD-10-CM | POA: Diagnosis not present

## 2018-11-01 DIAGNOSIS — R600 Localized edema: Secondary | ICD-10-CM | POA: Diagnosis not present

## 2018-11-01 DIAGNOSIS — J209 Acute bronchitis, unspecified: Secondary | ICD-10-CM | POA: Diagnosis not present

## 2018-11-01 DIAGNOSIS — Z7901 Long term (current) use of anticoagulants: Secondary | ICD-10-CM | POA: Diagnosis not present

## 2018-11-01 DIAGNOSIS — I252 Old myocardial infarction: Secondary | ICD-10-CM | POA: Diagnosis not present

## 2018-11-01 DIAGNOSIS — M6281 Muscle weakness (generalized): Secondary | ICD-10-CM | POA: Diagnosis not present

## 2018-11-01 DIAGNOSIS — I5022 Chronic systolic (congestive) heart failure: Secondary | ICD-10-CM | POA: Diagnosis not present

## 2018-11-01 DIAGNOSIS — Z87891 Personal history of nicotine dependence: Secondary | ICD-10-CM | POA: Insufficient documentation

## 2018-11-01 LAB — BASIC METABOLIC PANEL
ANION GAP: 8 (ref 5–15)
BUN: 19 mg/dL (ref 8–23)
CO2: 24 mmol/L (ref 22–32)
Calcium: 8.9 mg/dL (ref 8.9–10.3)
Chloride: 106 mmol/L (ref 98–111)
Creatinine, Ser: 1.19 mg/dL (ref 0.61–1.24)
GFR calc Af Amer: >60 mL/min (ref >60)
GFR calc non Af Amer: 55 mL/min — ABNORMAL LOW (ref >60)
Glucose, Bld: 115 mg/dL — ABNORMAL HIGH (ref 70–99)
POTASSIUM: 4.5 mmol/L (ref 3.5–5.1)
Sodium: 138 mmol/L (ref 135–145)

## 2018-11-01 LAB — BRAIN NATRIURETIC PEPTIDE: B Natriuretic Peptide: 842.5 pg/mL — ABNORMAL HIGH (ref 0.0–100.0)

## 2018-11-01 MED ORDER — AMIODARONE HCL 200 MG PO TABS: ORAL_TABLET | ORAL | 3 refills | Status: DC

## 2018-11-01 MED ORDER — ISOSORBIDE MONONITRATE ER 30 MG PO TB24: 90.0000 mg | ORAL_TABLET | Freq: Every day | ORAL | 5 refills | Status: DC

## 2018-11-01 MED ORDER — APIXABAN 5 MG PO TABS: 5.0000 mg | ORAL_TABLET | Freq: Two times a day (BID) | ORAL | 5 refills | Status: DC

## 2018-11-01 MED ORDER — CARVEDILOL 6.25 MG PO TABS: 6.2500 mg | ORAL_TABLET | Freq: Two times a day (BID) | ORAL | 5 refills | Status: DC

## 2018-11-01 MED ORDER — FUROSEMIDE 40 MG PO TABS: ORAL_TABLET | ORAL | 5 refills | Status: DC

## 2018-11-01 MED ORDER — POTASSIUM CHLORIDE ER 20 MEQ PO TBCR: 20.0000 meq | EXTENDED_RELEASE_TABLET | Freq: Every day | ORAL | 5 refills | Status: DC

## 2018-11-01 MED ORDER — ATORVASTATIN CALCIUM 80 MG PO TABS: 80.0000 mg | ORAL_TABLET | Freq: Every day | ORAL | 6 refills | Status: AC

## 2018-11-01 NOTE — Patient Instructions (Signed)
Labs done today. We will contact you with any abnormal lab work.  EKG done today.   STOP Ranolazine  START Amiodarone 200mg  (1 tab) twice daily for 10 days only. Then START Amiodarone 200mg  (1 tab) daily.  Follow up with our Advanced Practice Provider in 7 days.

## 2018-11-01 NOTE — Progress Notes (Signed)
ReDS Vest - 11/01/18 0900      ReDS Vest   MR   Moderate  (Pended)     Estimated volume prior to reading  Low  (Pended)     Fitting Posture  Sitting  (Pended)     Height Marker  Tall  (Pended)     Ruler Value  34  (Pended)     Center Strip  Aligned  (Pended)     ReDS Value  24  (Pended)

## 2018-11-01 NOTE — Progress Notes (Signed)
Patient ID: Eric Gordon, male   DOB: [birth year], 85 y.o.   MRN: [id number] PCP: Dr. Virgina Jock Cardiology: Dr. Aundra Dubin  85 y.o. with history of CAD s/p CABG presents for followup of CAD and CHF.  Prior to CABG, patient had significant exertional dyspnea.  He has had trouble long-term with exertional dyspnea.  PFTs in 3/14 showed only mild obstructive airways disease and V/Q scan showed no PE.  Echo in 9/14 showed normal LV systolic function with moderately dilated RV. [place] in 2/15 showed normal right and left heart filling pressures and normal PA pressure.  Finally, he had a CPX in 3/15 that showed normal capacity compared to age-matched sedentary norms.  He was noted to have chronotropic incompetence.  At a prior appointment, I took him off metoprolol given chronotropic incompetence noted on CPX.  Dyspnea improved significantly with weight loss.  He had Cardiolite in 8/18 with EF 52%, no ischemia or infarction.   He was admitted in 1/20 with NSTEMI.  LHC showed new occlusion of the branch of SVG-ramus and OM that touched down on OM.  There were also serial 70% stenoses in the mid/distal RCA.  There was not thought to be an interventional option and patient was treated medically. He was discharged home but presented a couple days later with recurrent chest pain and dyspnea.  Cath was repeated showing no change from prior.  This admission, he had FFR of the RCA which did not suggest hemodynamic significance.  Echo showed EF 55-60%, normal RV.  CTA did not show a PE. He was noted to be volume overloaded and was diuresed.  He was also noted to be in atrial fibrillation with RVR transiently.  ASA/Plavix was stopped and Eliquis was started. He was discharged to SNF.   He remains short of breath with mild exertion (walking in hall at [facility]).  He is using a walker and requires oxygen. No orthopnea/PND.  No chest pain.  He is noted to be back in atrial fibrillation with rate around 100 bpm today.   REDS clip  measurement 24%  ECG (personally reviewed): atrial fibrillation, rate 105, old inferior MI, QTc 549 msec  Labs (2/15): K 4.2, creatinine 1.2, LDL 87, HDL 39, BNP 71 Labs (4/15): K 3.9, creatinine 1.1 Labs (12/15): LDL 73, HDL 37, K 3.9, creatinine 1.0 Labs (12/16): LDL 71, HDL 49 Labs (3/17): K 4.2, creatinine 1.28, HCT 45 Labs (8/18): LDL 82, HDL 41, TSH normal, hgb 16.3, K 4.1, creatinine 1.05 Labs (2/20): K 3.9, creatinine 1.4  PMH: 1. Essential tremor 2. CAD: s/p CABG in 3/12.   - Cardiolite (8/18): EF 52%, no ischemia/infarction.  - NSTEMI (1/20):  LHC showed new occlusion of the branch of SVG-ramus and OM that touched down on OM.  There were also serial 70% stenoses in the mid/distal RCA.  FFR of RCA was negative.  3. Atrial fibrillation: Only noted post-op CABG.  4. PE: Post-op CABG in 2012.  5. AAA: 3.6 cm on [place] in 3/14.  3.6 cm on [place] in 3/15. 3.6 cm on [place] 8/16. 4.1 cm on [place] 4/17.  - AAA [place] (5/18): 4.1 cm AAA.  - AAA [place] (5/19): 4.1 cm AAA.  6. OSA: On CPAP.  7. Carotid stenosis: Carotid dopplers (6/30) with 16-01% LICA stenosis.  Carotid dopplers (0/93) with 23-55% LICA stenosis. Carotid dopplers (3/16) with 60-79% RCIA stenosis, 73-22% LICA stenosis.  - Carotid dopplers (8/16) with 40-59% RICA stenosis, 02-54% LICA stenosis.  - Carotid dopplers (8/17) with  44-01% RICA, 02-72% LICA. - Carotid dopplers (9/18) with 53-66% RICA, 44-03% LICA.   - Carotid dopplers (9/19) with 47-42% RICA, 59-56% LICA.  8. Asthma 9. PNA x 2 10. Chronic diastolic CHF/dyspnea: Echo (9/14) with EF 60-65%, mild LVH, very mild AS with mean gradient 10 mmHg, RV moderately dilated.  PFTs (3/14) showed mild obstructive airways disease.  V/Q scan (3/14) with no PE.  ENT workup for upper respiratory causes was negative.  RHC (2/15) with mean RA 7, PA 32/12, mean PCWP 13, CI 3.18.  CPX (3/15) with peak VO2 16.4, VE/VCO2 33; normal when compared to age-matched sedentary normals; chronotropic incompetence was  noted.  Dyspnea was improved with weight loss.  - Echo (8/17): EF 60-65%, mild AS.  - Echo (1/20): EF 55-60%, normal RV size and systolic function.  11. HTN 12. Aortic stenosis: Mild.  13. Ascending aortic aneurysm: CTA chest with 4.4 cm ascending aorta in 1/20.  14. Atrial fibrillation: Paroxysmal.  15. CKD: Stage 3.   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   FH: CAD.  Father had ruptured AAA.  Brothers also had AAA.  ROS: All systems reviewed and negative except as per HPI.   Current Outpatient Medications  Medication Sig Dispense Refill  . acetaminophen (TYLENOL) 500 MG tablet Take 1,000 mg by mouth every 6 (six) hours as needed for mild pain.    Marland Kitchen apixaban (ELIQUIS) 5 MG TABS tablet Take 1 tablet (5 mg total) by mouth 2 (two) times daily. 60 tablet 5  . Ascorbic Acid 500 MG CAPS Take 500 mg by mouth daily.    Marland Kitchen atorvastatin (LIPITOR) 80 MG tablet Take 1 tablet (80 mg total) by mouth daily. 30 tablet 6  . carvedilol (COREG) 6.25 MG tablet Take 1 tablet (6.25 mg total) by mouth 2 (two) times daily with a meal. 60 tablet 5  . cetirizine (ZYRTEC) 10 MG tablet Take 10 mg by mouth daily.      . cholecalciferol (VITAMIN D) 1000 UNITS tablet Take 1,000 Units by mouth daily.      Marland Kitchen doxycycline (VIBRA-TABS) 100 MG tablet Take 1 tablet (100 mg total) by mouth every 12 (twelve) hours for 7 days. 13 tablet 0  . furosemide (LASIX) 40 MG tablet Take 40 mg (1 tab) twice daily x 2 days, then start 40 mg (1 tab) DAILY. Take additional tab as directed by HF clinic 40 tablet 5  . isosorbide mononitrate (IMDUR) 30 MG 24 hr tablet Take 3 tablets (90 mg total) by mouth daily. 30 tablet 5  . levothyroxine (SYNTHROID, LEVOTHROID) 50 MCG tablet Take 50 mcg by mouth daily.    . Multiple Vitamins-Minerals (CENTRUM SILVER PO) Take 1 tablet by mouth daily.     . pantoprazole (PROTONIX) 40 MG tablet Take 40 mg by mouth daily.     . Potassium Chloride ER 20 MEQ TBCR Take 20 mEq by mouth  daily. 30 tablet 5  . tamsulosin (FLOMAX) 0.4 MG CAPS capsule Take 0.4 mg by mouth daily after supper.     . vitamin E 400 UNIT capsule Take 400 Units by mouth daily.    Marland Kitchen amiodarone (PACERONE) 200 MG tablet Take 200mg  (1 tab) twice daily for 10 days only. Then start 200mg  (1 tab) daily 90 tablet 3   No current facility-administered medications for this encounter.     BP 116/78   Pulse 95   Wt 101.5 kg (223 lb 12.8 oz)   SpO2 98%   BMI  27.97 kg/m  General: NAD Neck: No JVD, no thyromegaly or thyroid nodule.  Lungs: Clear to auscultation bilaterally with normal respiratory effort. CV: Nondisplaced PMI.  Heart mildly tachy, irregular S1/S2, no S3/S4, no murmur.  1+ ankle edema.  No carotid bruit.  Normal pedal pulses.  Abdomen: Soft, nontender, no hepatosplenomegaly, no distention.  Skin: Intact without lesions or rashes.  Neurologic: Alert and oriented x 3.  Psych: Normal affect. Extremities: No clubbing or cyanosis.  HEENT: Normal.   Assessment/Plan: 1. CAD: s/p CABG 3/12. NSTEMI 1/20; LHC showed new occlusion of the branch of SVG-ramus and OM that touched down on OM.  There were also serial 70% stenoses in the mid/distal RCA.  FFR of RCA was negative.  No interventional target.  - No ASA given Eliquis use.  - Continue Imdur 90 mg daily and Coreg 6.25 mg bid.  - Continue atorvastatin 80 daily.  - Stop ranolazine with QTc 549 msec (in setting of wide RBBB) and plan to start amiodarone (see below).  2. Hyperlipidemia: Goal LDL < 70.   - On atorvastatin 80 daily, check lipids next appt.  3. Chronic diastolic CHF: Echo in 9/60 with EF 55-60% and normal RV.  NYHA class III symptoms but he is not volume overloaded on exam. REDS clip reading is only 24%.   - Check BNP - Continue Lasix 40 mg daily, will not increase without evidence for volume overload. BMET today.  4. HTN: BP controlled.  5. Carotid stenosis: Repeat carotid dopplers in 9/20, moderate stenosis.  6. AAA: Repeat AAA [place] in  5/20.   7. Aortic stenosis: Mild on last echo.   8. Atrial fibrillation: Paroxysmal.  He is back in atrial fibrillation today.  Still with significant exertional dyspnea in absence of volume overload on exam or by REDS clip. No PE on CTA in 1/20.  I am concerned that he is significantly symptomatic while in atrial fibrillation.  Need to get him back in NSR.  - I will start amiodarone 200 mg bid x 10 days then 200 mg daily.  - stop ranolazine as above given long QT.  - Followup in 1 week.  If he is still in atrial fibrillation, will need to arrange for TEE-guided DCCV (has been on Eliquis < 1 week).  - Continue Eliquis 5 mg bid.   Followup in 1 week with NP/PA.   [name] 11/01/2018

## 2018-11-02 DIAGNOSIS — R600 Localized edema: Secondary | ICD-10-CM | POA: Diagnosis not present

## 2018-11-02 DIAGNOSIS — E559 Vitamin D deficiency, unspecified: Secondary | ICD-10-CM | POA: Diagnosis not present

## 2018-11-02 DIAGNOSIS — M6281 Muscle weakness (generalized): Secondary | ICD-10-CM | POA: Diagnosis not present

## 2018-11-02 DIAGNOSIS — E785 Hyperlipidemia, unspecified: Secondary | ICD-10-CM | POA: Diagnosis not present

## 2018-11-02 DIAGNOSIS — K219 Gastro-esophageal reflux disease without esophagitis: Secondary | ICD-10-CM | POA: Diagnosis not present

## 2018-11-02 DIAGNOSIS — N400 Enlarged prostate without lower urinary tract symptoms: Secondary | ICD-10-CM | POA: Diagnosis not present

## 2018-11-02 DIAGNOSIS — R52 Pain, unspecified: Secondary | ICD-10-CM | POA: Diagnosis not present

## 2018-11-02 DIAGNOSIS — I213 ST elevation (STEMI) myocardial infarction of unspecified site: Secondary | ICD-10-CM | POA: Diagnosis not present

## 2018-11-02 DIAGNOSIS — I10 Essential (primary) hypertension: Secondary | ICD-10-CM | POA: Diagnosis not present

## 2018-11-02 DIAGNOSIS — E039 Hypothyroidism, unspecified: Secondary | ICD-10-CM | POA: Diagnosis not present

## 2018-11-02 DIAGNOSIS — J309 Allergic rhinitis, unspecified: Secondary | ICD-10-CM | POA: Diagnosis not present

## 2018-11-02 DIAGNOSIS — I4891 Unspecified atrial fibrillation: Secondary | ICD-10-CM | POA: Diagnosis not present

## 2018-11-05 DIAGNOSIS — I213 ST elevation (STEMI) myocardial infarction of unspecified site: Secondary | ICD-10-CM | POA: Diagnosis not present

## 2018-11-05 DIAGNOSIS — J309 Allergic rhinitis, unspecified: Secondary | ICD-10-CM | POA: Diagnosis not present

## 2018-11-05 DIAGNOSIS — M6281 Muscle weakness (generalized): Secondary | ICD-10-CM | POA: Diagnosis not present

## 2018-11-05 DIAGNOSIS — N400 Enlarged prostate without lower urinary tract symptoms: Secondary | ICD-10-CM | POA: Diagnosis not present

## 2018-11-05 DIAGNOSIS — K219 Gastro-esophageal reflux disease without esophagitis: Secondary | ICD-10-CM | POA: Diagnosis not present

## 2018-11-05 DIAGNOSIS — E559 Vitamin D deficiency, unspecified: Secondary | ICD-10-CM | POA: Diagnosis not present

## 2018-11-05 DIAGNOSIS — E039 Hypothyroidism, unspecified: Secondary | ICD-10-CM | POA: Diagnosis not present

## 2018-11-05 DIAGNOSIS — E785 Hyperlipidemia, unspecified: Secondary | ICD-10-CM | POA: Diagnosis not present

## 2018-11-05 DIAGNOSIS — R600 Localized edema: Secondary | ICD-10-CM | POA: Diagnosis not present

## 2018-11-05 DIAGNOSIS — I4891 Unspecified atrial fibrillation: Secondary | ICD-10-CM | POA: Diagnosis not present

## 2018-11-05 DIAGNOSIS — R52 Pain, unspecified: Secondary | ICD-10-CM | POA: Diagnosis not present

## 2018-11-05 DIAGNOSIS — I10 Essential (primary) hypertension: Secondary | ICD-10-CM | POA: Diagnosis not present

## 2018-11-07 DIAGNOSIS — R262 Difficulty in walking, not elsewhere classified: Secondary | ICD-10-CM | POA: Diagnosis not present

## 2018-11-07 DIAGNOSIS — I213 ST elevation (STEMI) myocardial infarction of unspecified site: Secondary | ICD-10-CM | POA: Diagnosis not present

## 2018-11-07 DIAGNOSIS — M6281 Muscle weakness (generalized): Secondary | ICD-10-CM | POA: Diagnosis not present

## 2018-11-08 DIAGNOSIS — R600 Localized edema: Secondary | ICD-10-CM | POA: Diagnosis not present

## 2018-11-08 DIAGNOSIS — R52 Pain, unspecified: Secondary | ICD-10-CM | POA: Diagnosis not present

## 2018-11-08 DIAGNOSIS — E785 Hyperlipidemia, unspecified: Secondary | ICD-10-CM | POA: Diagnosis not present

## 2018-11-08 DIAGNOSIS — E559 Vitamin D deficiency, unspecified: Secondary | ICD-10-CM | POA: Diagnosis not present

## 2018-11-08 DIAGNOSIS — E039 Hypothyroidism, unspecified: Secondary | ICD-10-CM | POA: Diagnosis not present

## 2018-11-08 DIAGNOSIS — I4891 Unspecified atrial fibrillation: Secondary | ICD-10-CM | POA: Diagnosis not present

## 2018-11-08 DIAGNOSIS — N400 Enlarged prostate without lower urinary tract symptoms: Secondary | ICD-10-CM | POA: Diagnosis not present

## 2018-11-08 DIAGNOSIS — J309 Allergic rhinitis, unspecified: Secondary | ICD-10-CM | POA: Diagnosis not present

## 2018-11-08 DIAGNOSIS — I10 Essential (primary) hypertension: Secondary | ICD-10-CM | POA: Diagnosis not present

## 2018-11-08 DIAGNOSIS — K219 Gastro-esophageal reflux disease without esophagitis: Secondary | ICD-10-CM | POA: Diagnosis not present

## 2018-11-08 DIAGNOSIS — M6281 Muscle weakness (generalized): Secondary | ICD-10-CM | POA: Diagnosis not present

## 2018-11-08 DIAGNOSIS — I213 ST elevation (STEMI) myocardial infarction of unspecified site: Secondary | ICD-10-CM | POA: Diagnosis not present

## 2018-11-09 ENCOUNTER — Encounter (HOSPITAL_COMMUNITY): Payer: Self-pay

## 2018-11-09 ENCOUNTER — Ambulatory Visit (HOSPITAL_COMMUNITY)
Admission: RE | Admit: 2018-11-09 | Discharge: 2018-11-09 | Disposition: A | Discharge status: Home / Self Care | Payer: Medicare Other | Source: Ambulatory Visit | Attending: Cardiology | Admitting: Cardiology

## 2018-11-09 ENCOUNTER — Other Ambulatory Visit: Payer: Self-pay

## 2018-11-09 ENCOUNTER — Encounter (HOSPITAL_COMMUNITY): Payer: Self-pay | Admitting: Cardiology

## 2018-11-09 VITALS — BP 118/72 | HR 55 | Wt 218.2 lb

## 2018-11-09 DIAGNOSIS — I6529 Occlusion and stenosis of unspecified carotid artery: Secondary | ICD-10-CM | POA: Insufficient documentation

## 2018-11-09 DIAGNOSIS — I4510 Unspecified right bundle-branch block: Secondary | ICD-10-CM | POA: Insufficient documentation

## 2018-11-09 DIAGNOSIS — I5032 Chronic diastolic (congestive) heart failure: Secondary | ICD-10-CM | POA: Diagnosis not present

## 2018-11-09 DIAGNOSIS — R9431 Abnormal electrocardiogram [ECG] [EKG]: Secondary | ICD-10-CM | POA: Insufficient documentation

## 2018-11-09 DIAGNOSIS — I714 Abdominal aortic aneurysm, without rupture: Secondary | ICD-10-CM | POA: Diagnosis not present

## 2018-11-09 DIAGNOSIS — E785 Hyperlipidemia, unspecified: Secondary | ICD-10-CM | POA: Diagnosis not present

## 2018-11-09 DIAGNOSIS — I130 Hypertensive heart and chronic kidney disease with heart failure and stage 1 through stage 4 chronic kidney disease, or unspecified chronic kidney disease: Secondary | ICD-10-CM | POA: Insufficient documentation

## 2018-11-09 DIAGNOSIS — I350 Nonrheumatic aortic (valve) stenosis: Secondary | ICD-10-CM | POA: Insufficient documentation

## 2018-11-09 DIAGNOSIS — N183 Chronic kidney disease, stage 3 (moderate): Secondary | ICD-10-CM | POA: Diagnosis not present

## 2018-11-09 DIAGNOSIS — G4733 Obstructive sleep apnea (adult) (pediatric): Secondary | ICD-10-CM | POA: Insufficient documentation

## 2018-11-09 DIAGNOSIS — Z79899 Other long term (current) drug therapy: Secondary | ICD-10-CM | POA: Diagnosis not present

## 2018-11-09 DIAGNOSIS — R001 Bradycardia, unspecified: Secondary | ICD-10-CM | POA: Insufficient documentation

## 2018-11-09 DIAGNOSIS — Z951 Presence of aortocoronary bypass graft: Secondary | ICD-10-CM | POA: Diagnosis not present

## 2018-11-09 DIAGNOSIS — Z87891 Personal history of nicotine dependence: Secondary | ICD-10-CM | POA: Insufficient documentation

## 2018-11-09 DIAGNOSIS — I10 Essential (primary) hypertension: Secondary | ICD-10-CM

## 2018-11-09 DIAGNOSIS — Z8249 Family history of ischemic heart disease and other diseases of the circulatory system: Secondary | ICD-10-CM | POA: Insufficient documentation

## 2018-11-09 DIAGNOSIS — I2510 Atherosclerotic heart disease of native coronary artery without angina pectoris: Secondary | ICD-10-CM

## 2018-11-09 DIAGNOSIS — Z79890 Hormone replacement therapy (postmenopausal): Secondary | ICD-10-CM | POA: Insufficient documentation

## 2018-11-09 DIAGNOSIS — I252 Old myocardial infarction: Secondary | ICD-10-CM | POA: Insufficient documentation

## 2018-11-09 DIAGNOSIS — I480 Paroxysmal atrial fibrillation: Secondary | ICD-10-CM | POA: Diagnosis not present

## 2018-11-09 DIAGNOSIS — J45909 Unspecified asthma, uncomplicated: Secondary | ICD-10-CM | POA: Insufficient documentation

## 2018-11-09 DIAGNOSIS — Z7901 Long term (current) use of anticoagulants: Secondary | ICD-10-CM | POA: Diagnosis not present

## 2018-11-09 LAB — LIPID PANEL
Cholesterol: 96 mg/dL (ref 0–200)
HDL: 29 mg/dL — ABNORMAL LOW (ref >40)
LDL Cholesterol: 52 mg/dL (ref 0–99)
Total CHOL/HDL Ratio: 3.3 RATIO
Triglycerides: 75 mg/dL (ref <150)
VLDL: 15 mg/dL (ref 0–40)

## 2018-11-09 LAB — COMPREHENSIVE METABOLIC PANEL
ALT: 23 U/L (ref 0–44)
AST: 18 U/L (ref 15–41)
Albumin: 3 g/dL — ABNORMAL LOW (ref 3.5–5.0)
Alkaline Phosphatase: 68 U/L (ref 38–126)
Anion gap: 8 (ref 5–15)
BUN: 20 mg/dL (ref 8–23)
CO2: 26 mmol/L (ref 22–32)
CREATININE: 1.25 mg/dL — AB (ref 0.61–1.24)
Calcium: 9.1 mg/dL (ref 8.9–10.3)
Chloride: 105 mmol/L (ref 98–111)
GFR calc Af Amer: >60 mL/min (ref >60)
GFR calc non Af Amer: 52 mL/min — ABNORMAL LOW (ref >60)
Glucose, Bld: 101 mg/dL — ABNORMAL HIGH (ref 70–99)
Potassium: 4.7 mmol/L (ref 3.5–5.1)
Sodium: 139 mmol/L (ref 135–145)
Total Bilirubin: 0.9 mg/dL (ref 0.3–1.2)
Total Protein: 6.5 g/dL (ref 6.5–8.1)

## 2018-11-09 LAB — TSH: TSH: 8.006 u[IU]/mL — ABNORMAL HIGH (ref 0.350–4.500)

## 2018-11-09 MED ORDER — AMIODARONE HCL 200 MG PO TABS: ORAL_TABLET | ORAL | 3 refills | Status: DC

## 2018-11-09 NOTE — Progress Notes (Signed)
Patient ID: Eric Gordon, male   DOB: 1931/02/15, 85 y.o.   MRN: [id number] PCP: Dr. Virgina Jock Cardiology: Dr. Aundra Dubin  85 y.o. with history of CAD s/p CABG presents for followup of CAD and CHF.  Prior to CABG, patient had significant exertional dyspnea.  He has had trouble long-term with exertional dyspnea.  PFTs in 3/14 showed only mild obstructive airways disease and V/Q scan showed no PE.  Echo in 9/14 showed normal LV systolic function with moderately dilated RV. [place] in 2/15 showed normal right and left heart filling pressures and normal PA pressure.  Finally, he had a CPX in 3/15 that showed normal capacity compared to age-matched sedentary norms.  He was noted to have chronotropic incompetence.  At a prior appointment, I took him off metoprolol given chronotropic incompetence noted on CPX.  Dyspnea improved significantly with weight loss.  He had Cardiolite in 8/18 with EF 52%, no ischemia or infarction.   He was admitted in 1/20 with NSTEMI.  LHC showed new occlusion of the branch of SVG-ramus and OM that touched down on OM.  There were also serial 70% stenoses in the mid/distal RCA.  There was not thought to be an interventional option and patient was treated medically. He was discharged home but presented a couple days later with recurrent chest pain and dyspnea.  Cath was repeated showing no change from prior.  This admission, he had FFR of the RCA which did not suggest hemodynamic significance.  Echo showed EF 55-60%, normal RV.  CTA did not show a PE. He was noted to be volume overloaded and was diuresed.  He was also noted to be in atrial fibrillation with RVR transiently.  ASA/Plavix was stopped and Eliquis was started. He was discharged to SNF.   He returns for 1 week follow up. Last visit found to be back in afib and was SOB despite volume being stable. Started on amiodarone with plans for TEE/DCCV if still in Afib today. Today he is back in NSR. He is still at [facility] but hopefully getting  discharged soon. SOB slightly better. Remains SOB after walking 100 yards or if he moves quickly. Getting stronger, but still feels weak. No dizziness. No CP. No orthopnea or edema. Weaned off O2. Weights stable at 217 lbs at SNF, but down 5 lbs on our scale. Medications through SNF. Decreased appetite because of food at SNF.  ECG (personally reviewed): atrial fibrillation, rate 105, old inferior MI, QTc 549 msec  Labs (2/15): K 4.2, creatinine 1.2, LDL 87, HDL 39, BNP 71 Labs (4/15): K 3.9, creatinine 1.1 Labs (12/15): LDL 73, HDL 37, K 3.9, creatinine 1.0 Labs (12/16): LDL 71, HDL 49 Labs (3/17): K 4.2, creatinine 1.28, HCT 45 Labs (8/18): LDL 82, HDL 41, TSH normal, hgb 16.3, K 4.1, creatinine 1.05 Labs (2/20): K 3.9, creatinine 1.4  PMH: 1. Essential tremor 2. CAD: s/p CABG in 3/12.   - Cardiolite (8/18): EF 52%, no ischemia/infarction.  - NSTEMI (1/20):  LHC showed new occlusion of the branch of SVG-ramus and OM that touched down on OM.  There were also serial 70% stenoses in the mid/distal RCA.  FFR of RCA was negative.  3. Atrial fibrillation: Only noted post-op CABG.  4. PE: Post-op CABG in 2012.  5. AAA: 3.6 cm on [place] in 3/14.  3.6 cm on [place] in 3/15. 3.6 cm on [place] 8/16. 4.1 cm on [place] 4/17.  - AAA [place] (5/18): 4.1 cm AAA.  - AAA [place] (5/19):  4.1 cm AAA.  6. OSA: On CPAP.  7. Carotid stenosis: Carotid dopplers (9/14) with 78-29% LICA stenosis.  Carotid dopplers (5/62) with 13-08% LICA stenosis. Carotid dopplers (3/16) with 60-79% RCIA stenosis, 65-78% LICA stenosis.  - Carotid dopplers (8/16) with 40-59% RICA stenosis, 46-96% LICA stenosis.  - Carotid dopplers (8/17) with 29-52% RICA, 84-13% LICA. - Carotid dopplers (9/18) with 24-40% RICA, 10-27% LICA.   - Carotid dopplers (9/19) with 25-36% RICA, 64-40% LICA.  8. Asthma 9. PNA x 2 10. Chronic diastolic CHF/dyspnea: Echo (9/14) with EF 60-65%, mild LVH, very mild AS with mean gradient 10 mmHg, RV moderately dilated.  PFTs (3/14) showed  mild obstructive airways disease.  V/Q scan (3/14) with no PE.  ENT workup for upper respiratory causes was negative.  RHC (2/15) with mean RA 7, PA 32/12, mean PCWP 13, CI 3.18.  CPX (3/15) with peak VO2 16.4, VE/VCO2 33; normal when compared to age-matched sedentary normals; chronotropic incompetence was noted.  Dyspnea was improved with weight loss.  - Echo (8/17): EF 60-65%, mild AS.  - Echo (1/20): EF 55-60%, normal RV size and systolic function.  11. HTN 12. Aortic stenosis: Mild.  13. Ascending aortic aneurysm: CTA chest with 4.4 cm ascending aorta in 1/20.  14. Atrial fibrillation: Paroxysmal.  15. CKD: Stage 3.   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   FH: CAD.  Father had ruptured AAA.  Brothers also had AAA.  Review of systems complete and found to be negative unless listed in HPI.   Current Outpatient Medications  Medication Sig Dispense Refill  . acetaminophen (TYLENOL) 500 MG tablet Take 1,000 mg by mouth every 6 (six) hours as needed for mild pain.    Marland Kitchen amiodarone (PACERONE) 200 MG tablet Take 200mg  (1 tab) twice daily for 10 days only. 90 tablet 3  . apixaban (ELIQUIS) 5 MG TABS tablet Take 1 tablet (5 mg total) by mouth 2 (two) times daily. 60 tablet 5  . Ascorbic Acid 500 MG CAPS Take 500 mg by mouth daily.    Marland Kitchen atorvastatin (LIPITOR) 80 MG tablet Take 1 tablet (80 mg total) by mouth daily. 30 tablet 6  . carvedilol (COREG) 6.25 MG tablet Take 1 tablet (6.25 mg total) by mouth 2 (two) times daily with a meal. 60 tablet 5  . cetirizine (ZYRTEC) 10 MG tablet Take 10 mg by mouth daily.      . furosemide (LASIX) 40 MG tablet Take 40 mg (1 tab) twice daily x 2 days, then start 40 mg (1 tab) DAILY. Take additional tab as directed by HF clinic 40 tablet 5  . isosorbide mononitrate (IMDUR) 30 MG 24 hr tablet Take 3 tablets (90 mg total) by mouth daily. 30 tablet 5  . levothyroxine (SYNTHROID, LEVOTHROID) 50 MCG tablet Take 50 mcg by mouth daily.    .  pantoprazole (PROTONIX) 40 MG tablet Take 40 mg by mouth daily.     . Potassium Chloride ER 20 MEQ TBCR Take 20 mEq by mouth daily. 30 tablet 5  . tamsulosin (FLOMAX) 0.4 MG CAPS capsule Take 0.4 mg by mouth daily after supper.     . cholecalciferol (VITAMIN D) 1000 UNITS tablet Take 1,000 Units by mouth daily.      . Multiple Vitamins-Minerals (CENTRUM SILVER PO) Take 1 tablet by mouth daily.     . vitamin E 400 UNIT capsule Take 400 Units by mouth daily.     No current facility-administered medications for this  encounter.     BP 118/72   Pulse (!) 55   Wt 99 kg (218 lb 3.2 oz)   SpO2 95%   BMI 27.27 kg/m   Wt Readings from Last 3 Encounters:  11/09/18 99 kg (218 lb 3.2 oz)  11/01/18 101.5 kg (223 lb 12.8 oz)  10/27/18 98.6 kg (217 lb 6 oz)   General: Well appearing. No resp difficulty. HEENT: Normal Neck: Supple. JVP 5-6. Carotids 2+ bilat; no bruits. No thyromegaly or nodule noted. Cor: PMI nondisplaced. Regular, slightly slow, No M/G/R noted Lungs: Clear, slightly diminished in bases.  Abdomen: Soft, non-tender, non-distended, no HSM. No bruits or masses. +BS  Extremities: No cyanosis, clubbing, or rash. R and LLE trace edema.  Neuro: Alert & orientedx3, cranial nerves grossly intact. moves all 4 extremities w/o difficulty. Affect pleasant  EKG: Sinus brady 57 bpm with RBBB. QTc 502 ms. Personally reviewed.   Assessment/Plan: 1. CAD: s/p CABG 3/12. NSTEMI 1/20; LHC showed new occlusion of the branch of SVG-ramus and OM that touched down on OM.  There were also serial 70% stenoses in the mid/distal RCA.  FFR of RCA was negative.  No interventional target.  - No ASA given Eliquis use.  - Continue Imdur 90 mg daily and Coreg 6.25 mg bid.  - Continue atorvastatin 80 daily.   - Off ranolazine with prolonged QTc. QTc shorter on today's EKG 549 -> 502 ms.  - No s/s ischemia.  2. Hyperlipidemia: Goal LDL < 70.   - On atorvastatin 80 daily. Check lipids today.  3. Chronic  diastolic CHF: Echo in 7/58 with EF 55-60% and normal RV.  - NYHA III. Volume stable on exam. Weight trending down  - Continue Lasix 40 mg daily.  4. HTN: Stable. 5. Carotid stenosis: Repeat carotid dopplers in 9/20, moderate stenosis. No change.  6. AAA: Repeat AAA [place] in 5/20.  No change.  7. Aortic stenosis: Mild on last echo.  No change.  8. Atrial fibrillation: Paroxysmal. Still with significant exertional dyspnea in absence of volume overload. No PE on CTA in 1/20. He is significantly symptomatic while in atrial fibrillation.  - Continue amiodarone 200 mg bid through 2/20, then 200 mg daily. Check LFTs and TSH today - Off ranolazine as above given long QT. Improved on EKG today.  - He is back in NSR today - Continue Eliquis 5 mg bid. Denies bleeding.   Lipids, TSH, CMET today Follow up with Dr Aundra Dubin in 6-8 weeks. Sooner if symptoms worsen.  Provided a list of medications for the [place] with the reason he is on each medication.   Georgiana Shore 11/09/2018  Greater than 50% of the 25 minute visit was spent in counseling/coordination of care regarding disease state education, salt/fluid restriction, sliding scale diuretics, and medication compliance.

## 2018-11-09 NOTE — Patient Instructions (Addendum)
Lab work done today. We will notify you of any abnormal lab work.  DECREASE Amiodarone 200mg  (1 tab) daily  You have been given a letter for the [place].  Follow up with Dr. Aundra Dubin in 6-8 weeks.

## 2018-11-12 ENCOUNTER — Telehealth (HOSPITAL_COMMUNITY): Payer: Self-pay | Admitting: Cardiology

## 2018-11-12 NOTE — Telephone Encounter (Signed)
-----   Message from Georgiana Shore, NP sent at 11/09/2018  1:54 PM EST ----- Renal function stable. Potassium on high end of normal. He can stop taking daily potassium (takes 20 meq daily).

## 2018-11-12 NOTE — Telephone Encounter (Signed)
Notes recorded by Kerry Dory, CMA on 11/12/2018 at 3:29 PM EST Pt aware via wife ------  Notes recorded by Kerry Dory, CMA on 11/09/2018 at 3:11 PM EST Unable to reach patient. Line busy x 3 [phone] (H)  ------  Notes recorded by Georgiana Shore, NP on 11/09/2018 at 2:05 PM EST NSR 57 bpm with RBBB. ------  Notes recorded by Georgiana Shore, NP on 11/09/2018 at 1:54 PM EST Renal function stable. Potassium on high end of normal. He can stop taking daily potassium (takes 20 meq daily).

## 2018-11-13 DIAGNOSIS — I509 Heart failure, unspecified: Secondary | ICD-10-CM | POA: Diagnosis not present

## 2018-11-13 DIAGNOSIS — Z86711 Personal history of pulmonary embolism: Secondary | ICD-10-CM | POA: Diagnosis not present

## 2018-11-13 DIAGNOSIS — I110 Hypertensive heart disease with heart failure: Secondary | ICD-10-CM | POA: Diagnosis not present

## 2018-11-13 DIAGNOSIS — G894 Chronic pain syndrome: Secondary | ICD-10-CM | POA: Diagnosis not present

## 2018-11-13 DIAGNOSIS — G4733 Obstructive sleep apnea (adult) (pediatric): Secondary | ICD-10-CM | POA: Diagnosis not present

## 2018-11-13 DIAGNOSIS — Z87891 Personal history of nicotine dependence: Secondary | ICD-10-CM | POA: Diagnosis not present

## 2018-11-13 DIAGNOSIS — I214 Non-ST elevation (NSTEMI) myocardial infarction: Secondary | ICD-10-CM | POA: Diagnosis not present

## 2018-11-13 DIAGNOSIS — Z951 Presence of aortocoronary bypass graft: Secondary | ICD-10-CM | POA: Diagnosis not present

## 2018-11-13 DIAGNOSIS — M545 Low back pain: Secondary | ICD-10-CM | POA: Diagnosis not present

## 2018-11-13 DIAGNOSIS — I25810 Atherosclerosis of coronary artery bypass graft(s) without angina pectoris: Secondary | ICD-10-CM | POA: Diagnosis not present

## 2018-11-13 DIAGNOSIS — I2510 Atherosclerotic heart disease of native coronary artery without angina pectoris: Secondary | ICD-10-CM | POA: Diagnosis not present

## 2018-11-15 DIAGNOSIS — I25810 Atherosclerosis of coronary artery bypass graft(s) without angina pectoris: Secondary | ICD-10-CM | POA: Diagnosis not present

## 2018-11-15 DIAGNOSIS — I509 Heart failure, unspecified: Secondary | ICD-10-CM | POA: Diagnosis not present

## 2018-11-15 DIAGNOSIS — I214 Non-ST elevation (NSTEMI) myocardial infarction: Secondary | ICD-10-CM | POA: Diagnosis not present

## 2018-11-15 DIAGNOSIS — I2510 Atherosclerotic heart disease of native coronary artery without angina pectoris: Secondary | ICD-10-CM | POA: Diagnosis not present

## 2018-11-15 DIAGNOSIS — I110 Hypertensive heart disease with heart failure: Secondary | ICD-10-CM | POA: Diagnosis not present

## 2018-11-15 DIAGNOSIS — G894 Chronic pain syndrome: Secondary | ICD-10-CM | POA: Diagnosis not present

## 2018-11-19 ENCOUNTER — Telehealth (HOSPITAL_COMMUNITY): Payer: Self-pay | Admitting: *Deleted

## 2018-11-19 DIAGNOSIS — I509 Heart failure, unspecified: Secondary | ICD-10-CM | POA: Diagnosis not present

## 2018-11-19 DIAGNOSIS — I5032 Chronic diastolic (congestive) heart failure: Secondary | ICD-10-CM

## 2018-11-19 DIAGNOSIS — I214 Non-ST elevation (NSTEMI) myocardial infarction: Secondary | ICD-10-CM | POA: Diagnosis not present

## 2018-11-19 DIAGNOSIS — I25810 Atherosclerosis of coronary artery bypass graft(s) without angina pectoris: Secondary | ICD-10-CM | POA: Diagnosis not present

## 2018-11-19 DIAGNOSIS — G894 Chronic pain syndrome: Secondary | ICD-10-CM | POA: Diagnosis not present

## 2018-11-19 DIAGNOSIS — I110 Hypertensive heart disease with heart failure: Secondary | ICD-10-CM | POA: Diagnosis not present

## 2018-11-19 DIAGNOSIS — I2510 Atherosclerotic heart disease of native coronary artery without angina pectoris: Secondary | ICD-10-CM | POA: Diagnosis not present

## 2018-11-19 DIAGNOSIS — I480 Paroxysmal atrial fibrillation: Secondary | ICD-10-CM

## 2018-11-19 MED ORDER — CARVEDILOL 6.25 MG PO TABS: ORAL_TABLET | ORAL | 5 refills | Status: DC

## 2018-11-19 NOTE — Telephone Encounter (Signed)
Pt's wife called concerned about his BP, she states it has been running low in the mornings.  She states he gets up and takes meds and about an hour later he checks his BP it has been running in the upper 80s/40-60s.  She states on these days he just doesn't feel that well and states in bed more.  On 2/26 was 89/50, 27th 87/42, 90/44, today he is at 97/53.  She states later in the afternoon when he checks it it is better running 100-120s/40-60s.  We reviewed meds and his taking everything as ordered except the Isosorbide, she was confused on it and has only been giving him 1 tab (30 mg) daily.  Advised would send info to provider and call her back with further recommendations.  She states other wise pt is doing well and wt is stable.

## 2018-11-19 NOTE — Telephone Encounter (Signed)
Since weight has been stable, probably not volume related. He can try cutting his morning dose of coreg to 3.125 and keep evening dose at 6.25 to see if that helps. His TSH was also a little high on last labwork, which could make him more fatigued if he is hypothyroid. We need to check full thyroid labs sometime soon- could be related to his amio. Thanks!

## 2018-11-19 NOTE — Telephone Encounter (Signed)
Spoke w/pt's wife, she is aware and verbalizes medication changes, she will bring pt for labs on Mon 3/2

## 2018-11-19 NOTE — Telephone Encounter (Signed)
Spoke w/pt's wife, pt has been taking Amio only 200 mg DAILY since last OV.  Will send back to provider for further directions.

## 2018-11-19 NOTE — Telephone Encounter (Signed)
   Please call back.  He was to cut back amio to 200 mg daily on 2/20. If he hasnt then he needs to do so.   Thanks Emalea Mix

## 2018-11-22 ENCOUNTER — Ambulatory Visit (HOSPITAL_COMMUNITY)
Admission: RE | Admit: 2018-11-22 | Discharge: 2018-11-22 | Disposition: A | Discharge status: Home / Self Care | Payer: Medicare Other | Source: Ambulatory Visit | Attending: Internal Medicine | Admitting: Internal Medicine

## 2018-11-22 DIAGNOSIS — I25810 Atherosclerosis of coronary artery bypass graft(s) without angina pectoris: Secondary | ICD-10-CM | POA: Diagnosis not present

## 2018-11-22 DIAGNOSIS — I480 Paroxysmal atrial fibrillation: Secondary | ICD-10-CM | POA: Insufficient documentation

## 2018-11-22 DIAGNOSIS — I509 Heart failure, unspecified: Secondary | ICD-10-CM | POA: Diagnosis not present

## 2018-11-22 DIAGNOSIS — G894 Chronic pain syndrome: Secondary | ICD-10-CM | POA: Diagnosis not present

## 2018-11-22 DIAGNOSIS — I214 Non-ST elevation (NSTEMI) myocardial infarction: Secondary | ICD-10-CM | POA: Diagnosis not present

## 2018-11-22 DIAGNOSIS — I2510 Atherosclerotic heart disease of native coronary artery without angina pectoris: Secondary | ICD-10-CM | POA: Diagnosis not present

## 2018-11-22 DIAGNOSIS — I110 Hypertensive heart disease with heart failure: Secondary | ICD-10-CM | POA: Diagnosis not present

## 2018-11-22 DIAGNOSIS — I5032 Chronic diastolic (congestive) heart failure: Secondary | ICD-10-CM | POA: Insufficient documentation

## 2018-11-22 LAB — BASIC METABOLIC PANEL
Anion gap: 11 (ref 5–15)
BUN: 26 mg/dL — ABNORMAL HIGH (ref 8–23)
CO2: 25 mmol/L (ref 22–32)
Calcium: 9.3 mg/dL (ref 8.9–10.3)
Chloride: 104 mmol/L (ref 98–111)
Creatinine, Ser: 1.48 mg/dL — ABNORMAL HIGH (ref 0.61–1.24)
GFR calc Af Amer: 49 mL/min — ABNORMAL LOW (ref >60)
GFR calc non Af Amer: 43 mL/min — ABNORMAL LOW (ref >60)
Glucose, Bld: 104 mg/dL — ABNORMAL HIGH (ref 70–99)
Potassium: 3.8 mmol/L (ref 3.5–5.1)
SODIUM: 140 mmol/L (ref 135–145)

## 2018-11-22 LAB — TSH: TSH: 6.979 u[IU]/mL — AB (ref 0.350–4.500)

## 2018-11-22 LAB — T4, FREE: Free T4: 1.36 ng/dL (ref 0.82–1.77)

## 2018-11-23 LAB — T3, FREE: T3, Free: 2.2 pg/mL (ref 2.0–4.4)

## 2018-11-25 ENCOUNTER — Other Ambulatory Visit (HOSPITAL_COMMUNITY): Payer: Self-pay

## 2018-11-25 DIAGNOSIS — I110 Hypertensive heart disease with heart failure: Secondary | ICD-10-CM | POA: Diagnosis not present

## 2018-11-25 DIAGNOSIS — I214 Non-ST elevation (NSTEMI) myocardial infarction: Secondary | ICD-10-CM | POA: Diagnosis not present

## 2018-11-25 DIAGNOSIS — I509 Heart failure, unspecified: Secondary | ICD-10-CM | POA: Diagnosis not present

## 2018-11-25 DIAGNOSIS — G894 Chronic pain syndrome: Secondary | ICD-10-CM | POA: Diagnosis not present

## 2018-11-25 DIAGNOSIS — I25810 Atherosclerosis of coronary artery bypass graft(s) without angina pectoris: Secondary | ICD-10-CM | POA: Diagnosis not present

## 2018-11-25 DIAGNOSIS — I2510 Atherosclerotic heart disease of native coronary artery without angina pectoris: Secondary | ICD-10-CM | POA: Diagnosis not present

## 2018-11-30 DIAGNOSIS — G894 Chronic pain syndrome: Secondary | ICD-10-CM | POA: Diagnosis not present

## 2018-11-30 DIAGNOSIS — I25810 Atherosclerosis of coronary artery bypass graft(s) without angina pectoris: Secondary | ICD-10-CM | POA: Diagnosis not present

## 2018-11-30 DIAGNOSIS — I110 Hypertensive heart disease with heart failure: Secondary | ICD-10-CM | POA: Diagnosis not present

## 2018-11-30 DIAGNOSIS — I2510 Atherosclerotic heart disease of native coronary artery without angina pectoris: Secondary | ICD-10-CM | POA: Diagnosis not present

## 2018-11-30 DIAGNOSIS — I214 Non-ST elevation (NSTEMI) myocardial infarction: Secondary | ICD-10-CM | POA: Diagnosis not present

## 2018-11-30 DIAGNOSIS — I509 Heart failure, unspecified: Secondary | ICD-10-CM | POA: Diagnosis not present

## 2018-12-08 ENCOUNTER — Telehealth (HOSPITAL_COMMUNITY): Payer: Self-pay

## 2018-12-08 NOTE — Telephone Encounter (Signed)
Attempted to call patient in regards to Cardiac Rehab - to let pt know we are closed at this time due to the COVID-19 and will contact once we have resume scheduling.  °LMTCB °

## 2018-12-08 NOTE — Telephone Encounter (Signed)
Pt returned CR phone call and stated he would like to participate. And at this time we are not scheduling due to the COVID-19. Once we have resume scheduling we will contact the pt. He verbalized understanding.

## 2018-12-08 NOTE — Telephone Encounter (Signed)
Pt insurance is active and benefits verified through Medicare A/B. Co-pay $0.00, DED $198.00/$198.00 met, out of pocket $0.00/$0.00 met, co-insurance 20%. No pre-authorization required. Passport, 12/08/2018 @ 10:41AM, REF# [id number]  2ndary insurance is active and benefits verified through [facility]. Co-pay $0.00, DED $0.00/$0.00 met, out of pocket $0.00/$0.00 met, co-insurance 0%. No pre-authorization required. Passport, 12/08/2018 @ 10:43AM, REF# [id number]

## 2018-12-31 ENCOUNTER — Encounter (HOSPITAL_COMMUNITY): Payer: Medicare Other | Admitting: Cardiology

## 2019-01-05 ENCOUNTER — Telehealth (HOSPITAL_COMMUNITY): Payer: Self-pay

## 2019-01-05 ENCOUNTER — Other Ambulatory Visit: Payer: Self-pay

## 2019-01-05 ENCOUNTER — Ambulatory Visit (HOSPITAL_COMMUNITY)
Admission: RE | Admit: 2019-01-05 | Discharge: 2019-01-05 | Disposition: A | Discharge status: Home / Self Care | Payer: Medicare Other | Source: Ambulatory Visit | Attending: Cardiology | Admitting: Cardiology

## 2019-01-05 DIAGNOSIS — I5032 Chronic diastolic (congestive) heart failure: Secondary | ICD-10-CM | POA: Diagnosis not present

## 2019-01-05 DIAGNOSIS — I5031 Acute diastolic (congestive) heart failure: Secondary | ICD-10-CM

## 2019-01-05 DIAGNOSIS — I480 Paroxysmal atrial fibrillation: Secondary | ICD-10-CM | POA: Diagnosis not present

## 2019-01-05 NOTE — Patient Instructions (Addendum)
1. Followup 3 wks telehealth scheduled for 6 May @ 930am  2. Stop Coreg with low blood pressure and loss of conciseness.   3. Wear Zio patch for 2 weeks,Dr Aundra Dubin wants to look for arrhythmia.   4. Needs CMET, CBC, BNP drawn, scheduled for 22 April   5. Please call if chest pain changes/worsens [phone], option 2 for the nurses.

## 2019-01-05 NOTE — Telephone Encounter (Signed)
AVS reviewed:  1. Followup 3 wks telehealth scheduled for 6 May @ 930am  2. Stop Coreg with low blood pressure and loss of conciseness.   3. Wear Zio patch for 2 weeks,Dr Aundra Dubin wants to look for arrhythmia. Will get sent to home.  4. Needs CMET, CBC, BNP drawn, scheduled for 22 April   5. Please call if chest pain changes/worsens [phone], option 2 for the nurses.

## 2019-01-06 NOTE — Progress Notes (Signed)
Heart Failure TeleHealth Note  Due to national recommendations of social distancing due to [place] 19, Audio/video telehealth visit is felt to be most appropriate for this patient at this time.  See MyChart message from today for patient consent regarding telehealth for [facility].  Date:  01/06/2019   ID:  Eric Gordon, DOB 1930/11/01, MRN [id number]  Location: Home  Provider location: [facility] Type of Visit: Established patient  PCP:  Shon Baton, MD  Cardiologist:  Dr. Aundra Dubin  Chief Complaint: Shortness of breath   History of Present Illness: Eric Gordon is a 85 y.o. male who presents via audio/video conferencing for a telehealth visit today.     he denies symptoms worrisome for COVID 19.   Paitent has history of CAD s/p CABG.  He has had trouble long-term with exertional dyspnea.  Dyspnea triggered evaluation in 2012 leading to CABG but was not resolved by CABG.  PFTs in 3/14 showed only mild obstructive airways disease and V/Q scan showed no PE.  Echo in 9/14 showed normal LV systolic function with moderately dilated RV. [place] in 2/15 showed normal right and left heart filling pressures and normal PA pressure.  Finally, he had a CPX in 3/15 that showed normal capacity compared to age-matched sedentary norms.  He was noted to have chronotropic incompetence.  At a prior appointment, I took him off metoprolol given chronotropic incompetence noted on CPX.  Dyspnea improved significantly with weight loss.  He had Cardiolite in 8/18 with EF 52%, no ischemia or infarction.   He was admitted in 1/20 with NSTEMI.  LHC showed new occlusion of the branch of SVG-ramus and OM that touched down on OM.  There were also serial 70% stenoses in the mid/distal RCA.  There was not thought to be an interventional option and patient was treated medically. He was discharged home but presented a couple days later with recurrent chest pain and dyspnea.  Cath was  repeated showing no change from prior.  This admission, he had FFR of the RCA which did not suggest hemodynamic significance.  Echo showed EF 55-60%, normal RV.  CTA did not show a PE. He was noted to be volume overloaded and was diuresed.  He was also noted to be in atrial fibrillation with RVR transiently.  ASA/Plavix was stopped and Eliquis was started. He was discharged to SNF.  Atrial fibrillation recurred and he was started on amiodarone with conversion back to NSR.   Patient has had 2 episodes recently of presyncope, one about a week ago and one about 3 wks ago.  Both times he was standing and had to lie down.  He did not feel palpitations. SBP was in the 90s when he checked after the events.  BP has been running low, as low as 13Y systolic in the last few weeks.  His PCP had him decrease Imdur to 60 mg daily.  He feels that this helped his lightheadedness some.  Weight is down about 10 lbs.  He is still getting lightheaded if he stands up fast.  He is short of breath if he walks about 1/2 mile.  No dyspnea in house or walking up a flight of stairs.  Dyspnea is stable.  HR feels regular to him and HR 50 today, suspect NSR.  He has not felt palpitations. No BRBPR/melena.  He has had a dull ache in his central chest for about 2 wks.  It has been continuous and does  not worsen with exertion.   Labs (2/15): K 4.2, creatinine 1.2, LDL 87, HDL 39, BNP 71 Labs (4/15): K 3.9, creatinine 1.1 Labs (12/15): LDL 73, HDL 37, K 3.9, creatinine 1.0 Labs (12/16): LDL 71, HDL 49 Labs (3/17): K 4.2, creatinine 1.28, HCT 45 Labs (8/18): LDL 82, HDL 41, TSH normal, hgb 16.3, K 4.1, creatinine 1.05 Labs (2/20): LFTs normal, pro BNP 842 Labs (3/20): K 3.8, creatinine 1.48, TSH mildly elevated at 6.97, free T3 and free T4 normal  PMH: 1. Essential tremor 2. CAD: s/p CABG in 3/12.   - Cardiolite (8/18): EF 52%, no ischemia/infarction.  - NSTEMI (1/20):  LHC showed new occlusion of the branch of SVG-ramus and OM  that touched down on OM.  There were also serial 70% stenoses in the mid/distal RCA.  FFR of RCA was negative.  3. Atrial fibrillation: Only noted post-op CABG.  4. PE: Post-op CABG in 2012.  5. AAA: 3.6 cm on [place] in 3/14.  3.6 cm on [place] in 3/15. 3.6 cm on [place] 8/16. 4.1 cm on [place] 4/17.  - AAA [place] (5/18): 4.1 cm AAA.  - AAA [place] (5/19): 4.1 cm AAA.  6. OSA: On CPAP.  7. Carotid stenosis: Carotid dopplers (7/10) with 62-69% LICA stenosis.  Carotid dopplers (4/85) with 46-27% LICA stenosis. Carotid dopplers (3/16) with 60-79% RCIA stenosis, 03-50% LICA stenosis.  - Carotid dopplers (8/16) with 40-59% RICA stenosis, 09-38% LICA stenosis.  - Carotid dopplers (8/17) with 18-29% RICA, 93-71% LICA. - Carotid dopplers (9/18) with 69-67% RICA, 89-38% LICA.   - Carotid dopplers (9/19) with 10-17% RICA, 51-02% LICA.  8. Asthma 9. PNA x 2 10. Chronic diastolic CHF/dyspnea: Echo (9/14) with EF 60-65%, mild LVH, very mild AS with mean gradient 10 mmHg, RV moderately dilated.  PFTs (3/14) showed mild obstructive airways disease.  V/Q scan (3/14) with no PE.  ENT workup for upper respiratory causes was negative.  RHC (2/15) with mean RA 7, PA 32/12, mean PCWP 13, CI 3.18.  CPX (3/15) with peak VO2 16.4, VE/VCO2 33; normal when compared to age-matched sedentary normals; chronotropic incompetence was noted.  Dyspnea was improved with weight loss.  - Echo (8/17): EF 60-65%, mild AS.  - Echo (1/20): EF 55-60%, normal RV size and systolic function.  11. HTN 12. Aortic stenosis: Mild.  13. Ascending aortic aneurysm: CTA chest with 4.4 cm ascending aorta in 1/20.  14. Atrial fibrillation: Paroxysmal.  15. CKD: Stage 3.    Current Outpatient Medications  Medication Sig Dispense Refill  . acetaminophen (TYLENOL) 500 MG tablet Take 1,000 mg by mouth every 6 (six) hours as needed for mild pain.    Marland Kitchen amiodarone (PACERONE) 200 MG tablet Take 200mg  (1 tab) twice daily for 10 days only. 90 tablet 3  . apixaban (ELIQUIS) 5 MG  TABS tablet Take 1 tablet (5 mg total) by mouth 2 (two) times daily. 60 tablet 5  . Ascorbic Acid 500 MG CAPS Take 500 mg by mouth daily.    Marland Kitchen atorvastatin (LIPITOR) 80 MG tablet Take 1 tablet (80 mg total) by mouth daily. 30 tablet 6  . carvedilol (COREG) 6.25 MG tablet Take 3.125 mg in AM and 6.25 mg in PM 60 tablet 5  . cetirizine (ZYRTEC) 10 MG tablet Take 10 mg by mouth daily.      . cholecalciferol (VITAMIN D) 1000 UNITS tablet Take 1,000 Units by mouth daily.      . furosemide (LASIX) 40 MG tablet Take 40 mg (  1 tab) twice daily x 2 days, then start 40 mg (1 tab) DAILY. Take additional tab as directed by HF clinic 40 tablet 5  . isosorbide mononitrate (IMDUR) 30 MG 24 hr tablet Take 3 tablets (90 mg total) by mouth daily. 30 tablet 5  . levothyroxine (SYNTHROID, LEVOTHROID) 50 MCG tablet Take 50 mcg by mouth daily.    . Multiple Vitamins-Minerals (CENTRUM SILVER PO) Take 1 tablet by mouth daily.     . pantoprazole (PROTONIX) 40 MG tablet Take 40 mg by mouth daily.     . tamsulosin (FLOMAX) 0.4 MG CAPS capsule Take 0.4 mg by mouth daily after supper.     . vitamin E 400 UNIT capsule Take 400 Units by mouth daily.     No current facility-administered medications for this encounter.     Allergies:   Ancef [cefazolin sodium] and Vancomycin   Social History:  The patient  reports that he quit smoking about 34 years ago. His smoking use included cigarettes. He has a 35.00 pack-year smoking history. He has never used smokeless tobacco. He reports current alcohol use of about 2.0 standard drinks of alcohol per week. He reports that he does not use drugs.   Family History:  The patient's family history includes Coronary artery disease in his mother; Heart disease in his mother; Hypertension in his mother.   ROS:  Please see the history of present illness.   All other systems are personally reviewed and negative.   Exam:  (Video/Tele Health Call; Exam is subjective and or/visual.) BP 90/52, HR  50 (measured by patient) General:  Speaks in full sentences. No resp difficulty. Neck: No JVD Lungs: Normal respiratory effort with conversation.  Abdomen: Non-distended per patient report Extremities: Pt denies edema. Neuro: Alert & oriented x 3.   Recent Labs: 10/26/2018: Hemoglobin 11.6; Platelets 187 10/28/2018: Magnesium 1.9 11/01/2018: B Natriuretic Peptide 842.5 11/09/2018: ALT 23 11/22/2018: BUN 26; Creatinine, Ser 1.48; Potassium 3.8; Sodium 140; TSH 6.979  Personally reviewed   Wt Readings from Last 3 Encounters:  11/09/18 99 kg (218 lb 3.2 oz)  11/01/18 101.5 kg (223 lb 12.8 oz)  10/27/18 98.6 kg (217 lb 6 oz)      ASSESSMENT AND PLAN:  1. CAD: s/p CABG 3/12. NSTEMI 1/20, LHC showed new occlusion of the branch of SVG-ramus and OM that touched down on OM.  There were also serial 70% stenoses in the mid/distal RCA.  FFR of RCA was negative.  No interventional target. He has had mild atypical chest pain for about 2 wks.  It has been continuous.  I do not think that this is cardiac.  - No ASA given Eliquis use.  - Continue Imdur 60 mg daily.  - Continue atorvastatin 80 daily.   - Off ranolazine with prolonged QTc.  2. Hyperlipidemia: Goal LDL < 70.   - On atorvastatin 80 daily, good lipids in 3/20.  3. Chronic diastolic CHF: Echo in 2/87 with EF 55-60% and normal RV. Chronic NYHA class II-III symptoms.  His weight is down 10 lbs and he does not look volume overloaded on exam.  - Continue Lasix 40 mg daily. BMET today.  4. Hypotension with presyncope: SBP running 80s-90s at times. He is lightheaded with standing.  HR is around 50.   - I am going to have him stop Coreg for now.  - I think that the presyncopal episodes were likely orthostatic, but given history of CAD and atrial fibrillation, I am going to have him  wear a Zio patch for 2 wks.  5. Carotid stenosis: Repeat carotid dopplers in 9/20, moderate stenosis on prior studies.   6. AAA: Repeat AAA [place] in 5/20.   7. Aortic  stenosis: Mild on last echo.   8. Atrial fibrillation: Paroxysmal. He is significantly symptomatic while in atrial fibrillation.  HR 50 today and he says that pulse feels regular.  Suspect NSR.  - Continue amiodarone 200 mg daily. TSH has been mildly elevated with normal free T3/T4.  Check LFTs today.  He will need a regular eye exam.  - Off ranolazine as above given long QT.  - Continue Eliquis 5 mg bid. Denies bleeding.   COVID screen The patient does not have any symptoms that suggest any further testing/ screening at this time.  Social distancing reinforced today.  Relevant cardiac medications were reviewed at length with the patient today.   The patient does not have concerns regarding their medications at this time.   Recommended follow-up:  2 weeks via telehealth  Today, I have spent 23 minutes with the patient with telehealth technology discussing the above issues .    Signed, [name], MD  01/06/2019 1:17 PM  [facility] [address] [facility] [place] [address] [phone] (office) [phone] (fax)

## 2019-01-09 ENCOUNTER — Inpatient Hospital Stay (HOSPITAL_COMMUNITY)
Admission: EM | Admit: 2019-01-09 | Discharge: 2019-01-14 | DRG: 200 | Disposition: A | Discharge status: Home / Self Care | Payer: Medicare Other | Attending: Internal Medicine | Admitting: Internal Medicine

## 2019-01-09 ENCOUNTER — Other Ambulatory Visit: Payer: Self-pay

## 2019-01-09 ENCOUNTER — Emergency Department (HOSPITAL_COMMUNITY): Payer: Medicare Other

## 2019-01-09 ENCOUNTER — Encounter (HOSPITAL_COMMUNITY): Payer: Self-pay

## 2019-01-09 DIAGNOSIS — Z86711 Personal history of pulmonary embolism: Secondary | ICD-10-CM | POA: Diagnosis not present

## 2019-01-09 DIAGNOSIS — I2510 Atherosclerotic heart disease of native coronary artery without angina pectoris: Secondary | ICD-10-CM | POA: Diagnosis present

## 2019-01-09 DIAGNOSIS — N183 Chronic kidney disease, stage 3 unspecified: Secondary | ICD-10-CM | POA: Diagnosis present

## 2019-01-09 DIAGNOSIS — R079 Chest pain, unspecified: Secondary | ICD-10-CM | POA: Diagnosis not present

## 2019-01-09 DIAGNOSIS — Z8701 Personal history of pneumonia (recurrent): Secondary | ICD-10-CM

## 2019-01-09 DIAGNOSIS — I10 Essential (primary) hypertension: Secondary | ICD-10-CM | POA: Diagnosis present

## 2019-01-09 DIAGNOSIS — R896 Abnormal cytological findings in specimens from other organs, systems and tissues: Secondary | ICD-10-CM | POA: Diagnosis not present

## 2019-01-09 DIAGNOSIS — J4520 Mild intermittent asthma, uncomplicated: Secondary | ICD-10-CM | POA: Diagnosis present

## 2019-01-09 DIAGNOSIS — I130 Hypertensive heart and chronic kidney disease with heart failure and stage 1 through stage 4 chronic kidney disease, or unspecified chronic kidney disease: Secondary | ICD-10-CM | POA: Diagnosis not present

## 2019-01-09 DIAGNOSIS — J939 Pneumothorax, unspecified: Secondary | ICD-10-CM | POA: Diagnosis not present

## 2019-01-09 DIAGNOSIS — G4733 Obstructive sleep apnea (adult) (pediatric): Secondary | ICD-10-CM | POA: Diagnosis present

## 2019-01-09 DIAGNOSIS — E039 Hypothyroidism, unspecified: Secondary | ICD-10-CM | POA: Diagnosis not present

## 2019-01-09 DIAGNOSIS — I252 Old myocardial infarction: Secondary | ICD-10-CM

## 2019-01-09 DIAGNOSIS — J9383 Other pneumothorax: Principal | ICD-10-CM | POA: Diagnosis present

## 2019-01-09 DIAGNOSIS — Z79899 Other long term (current) drug therapy: Secondary | ICD-10-CM

## 2019-01-09 DIAGNOSIS — I5032 Chronic diastolic (congestive) heart failure: Secondary | ICD-10-CM | POA: Diagnosis not present

## 2019-01-09 DIAGNOSIS — I714 Abdominal aortic aneurysm, without rupture, unspecified: Secondary | ICD-10-CM | POA: Diagnosis present

## 2019-01-09 DIAGNOSIS — I4819 Other persistent atrial fibrillation: Secondary | ICD-10-CM | POA: Diagnosis present

## 2019-01-09 DIAGNOSIS — I480 Paroxysmal atrial fibrillation: Secondary | ICD-10-CM | POA: Diagnosis present

## 2019-01-09 DIAGNOSIS — I4891 Unspecified atrial fibrillation: Secondary | ICD-10-CM | POA: Diagnosis present

## 2019-01-09 DIAGNOSIS — Z87891 Personal history of nicotine dependence: Secondary | ICD-10-CM

## 2019-01-09 DIAGNOSIS — Z951 Presence of aortocoronary bypass graft: Secondary | ICD-10-CM

## 2019-01-09 DIAGNOSIS — E7800 Pure hypercholesterolemia, unspecified: Secondary | ICD-10-CM | POA: Diagnosis present

## 2019-01-09 DIAGNOSIS — J90 Pleural effusion, not elsewhere classified: Secondary | ICD-10-CM | POA: Diagnosis present

## 2019-01-09 DIAGNOSIS — E785 Hyperlipidemia, unspecified: Secondary | ICD-10-CM | POA: Diagnosis present

## 2019-01-09 DIAGNOSIS — R846 Abnormal cytological findings in specimens from respiratory organs and thorax: Secondary | ICD-10-CM | POA: Diagnosis not present

## 2019-01-09 DIAGNOSIS — I5043 Acute on chronic combined systolic (congestive) and diastolic (congestive) heart failure: Secondary | ICD-10-CM | POA: Diagnosis present

## 2019-01-09 DIAGNOSIS — I5042 Chronic combined systolic (congestive) and diastolic (congestive) heart failure: Secondary | ICD-10-CM | POA: Diagnosis present

## 2019-01-09 DIAGNOSIS — Z7901 Long term (current) use of anticoagulants: Secondary | ICD-10-CM

## 2019-01-09 DIAGNOSIS — K219 Gastro-esophageal reflux disease without esophagitis: Secondary | ICD-10-CM | POA: Diagnosis present

## 2019-01-09 DIAGNOSIS — Z79890 Hormone replacement therapy (postmenopausal): Secondary | ICD-10-CM

## 2019-01-09 DIAGNOSIS — N1832 Chronic kidney disease, stage 3b: Secondary | ICD-10-CM | POA: Diagnosis present

## 2019-01-09 DIAGNOSIS — R0600 Dyspnea, unspecified: Secondary | ICD-10-CM

## 2019-01-09 DIAGNOSIS — J9811 Atelectasis: Secondary | ICD-10-CM | POA: Diagnosis not present

## 2019-01-09 LAB — PROTEIN, PLEURAL OR PERITONEAL FLUID: Total protein, fluid: 4.4 g/dL

## 2019-01-09 LAB — CBC
HCT: 46.5 % (ref 39.0–52.0)
Hemoglobin: 15 g/dL (ref 13.0–17.0)
MCH: 31.4 pg (ref 26.0–34.0)
MCHC: 32.3 g/dL (ref 30.0–36.0)
MCV: 97.3 fL (ref 80.0–100.0)
Platelets: 170 10*3/uL (ref 150–400)
RBC: 4.78 MIL/uL (ref 4.22–5.81)
RDW: 14.6 % (ref 11.5–15.5)
WBC: 8.3 10*3/uL (ref 4.0–10.5)
nRBC: 0 % (ref 0.0–0.2)

## 2019-01-09 LAB — LACTATE DEHYDROGENASE: LDH: 122 U/L (ref 98–192)

## 2019-01-09 LAB — BASIC METABOLIC PANEL
Anion gap: 12 (ref 5–15)
BUN: 19 mg/dL (ref 8–23)
CO2: 28 mmol/L (ref 22–32)
Calcium: 9.4 mg/dL (ref 8.9–10.3)
Chloride: 98 mmol/L (ref 98–111)
Creatinine, Ser: 1.47 mg/dL — ABNORMAL HIGH (ref 0.61–1.24)
GFR calc Af Amer: 50 mL/min — ABNORMAL LOW (ref >60)
GFR calc non Af Amer: 43 mL/min — ABNORMAL LOW (ref >60)
Glucose, Bld: 117 mg/dL — ABNORMAL HIGH (ref 70–99)
Potassium: 3.9 mmol/L (ref 3.5–5.1)
Sodium: 138 mmol/L (ref 135–145)

## 2019-01-09 LAB — BRAIN NATRIURETIC PEPTIDE: B Natriuretic Peptide: 239.2 pg/mL — ABNORMAL HIGH (ref 0.0–100.0)

## 2019-01-09 LAB — LACTATE DEHYDROGENASE, PLEURAL OR PERITONEAL FLUID: LD, Fluid: 206 U/L — ABNORMAL HIGH (ref 3–23)

## 2019-01-09 LAB — TROPONIN I: Troponin I: <0.03 ng/mL (ref <0.03)

## 2019-01-09 MED ORDER — ACETAMINOPHEN 650 MG RE SUPP: 650.0000 mg | Freq: Four times a day (QID) | RECTAL | Status: DC | PRN

## 2019-01-09 MED ORDER — LIDOCAINE-EPINEPHRINE (PF) 2 %-1:200000 IJ SOLN
20.0000 mL | Freq: Once | INTRAMUSCULAR | Status: AC
  Administered 2019-01-09: 20 mL via INTRADERMAL
  Filled 2019-01-09: qty 20

## 2019-01-09 MED ORDER — SODIUM CHLORIDE 0.9% FLUSH: 3.0000 mL | INTRAVENOUS | Status: DC | PRN

## 2019-01-09 MED ORDER — MORPHINE SULFATE (PF) 4 MG/ML IV SOLN
4.0000 mg | Freq: Once | INTRAVENOUS | Status: AC
  Administered 2019-01-09: 4 mg via INTRAVENOUS
  Filled 2019-01-09: qty 1

## 2019-01-09 MED ORDER — SODIUM CHLORIDE 0.9% FLUSH
3.0000 mL | Freq: Two times a day (BID) | INTRAVENOUS | Status: DC
  Administered 2019-01-09 – 2019-01-14 (×9): 3 mL via INTRAVENOUS

## 2019-01-09 MED ORDER — OXYCODONE-ACETAMINOPHEN 5-325 MG PO TABS
1.0000 | ORAL_TABLET | Freq: Four times a day (QID) | ORAL | Status: DC | PRN
  Administered 2019-01-10 – 2019-01-14 (×12): 1 via ORAL
  Filled 2019-01-09 (×12): qty 1

## 2019-01-09 MED ORDER — NITROGLYCERIN 0.4 MG SL SUBL: 0.4000 mg | SUBLINGUAL_TABLET | SUBLINGUAL | Status: DC | PRN

## 2019-01-09 MED ORDER — POLYETHYLENE GLYCOL 3350 17 G PO PACK: 17.0000 g | PACK | Freq: Every day | ORAL | Status: DC | PRN

## 2019-01-09 MED ORDER — ONDANSETRON HCL 4 MG/2ML IJ SOLN: 4.0000 mg | Freq: Four times a day (QID) | INTRAMUSCULAR | Status: DC | PRN

## 2019-01-09 MED ORDER — SODIUM CHLORIDE 0.9% FLUSH
3.0000 mL | Freq: Two times a day (BID) | INTRAVENOUS | Status: DC
  Administered 2019-01-11: 3 mL via INTRAVENOUS

## 2019-01-09 MED ORDER — ACETAMINOPHEN 325 MG PO TABS: 650.0000 mg | ORAL_TABLET | Freq: Four times a day (QID) | ORAL | Status: DC | PRN

## 2019-01-09 MED ORDER — SODIUM CHLORIDE 0.9 % IV SOLN: 250.0000 mL | INTRAVENOUS | Status: DC | PRN

## 2019-01-09 MED ORDER — ONDANSETRON HCL 4 MG PO TABS: 4.0000 mg | ORAL_TABLET | Freq: Four times a day (QID) | ORAL | Status: DC | PRN

## 2019-01-09 MED ORDER — MORPHINE SULFATE (PF) 4 MG/ML IV SOLN
3.0000 mg | INTRAVENOUS | Status: DC | PRN
  Administered 2019-01-09 – 2019-01-12 (×5): 3 mg via INTRAVENOUS
  Filled 2019-01-09 (×5): qty 1

## 2019-01-09 NOTE — H&P (Addendum)
History and Physical    Eric Gordon LTJ:[id number] DOB: 05/10/1931 DOA: 01/09/2019  PCP: Shon Baton, MD   Patient coming from: home   Chief Complaint: Chest pain   HPI: Eric Gordon is a 85 y.o. male with medical history significant for coronary artery disease status post CABG, chronic kidney disease stage III, chronic diastolic CHF, paroxysmal atrial fibrillation on Eliquis, hypothyroidism, and AAA, now presenting to the emergency department for evaluation of chest pain.  Patient went to bed in his usual state and developed an aching left-sided chest pain with occasional sharp components shortly after waking this morning.  Patient reports that he had been sitting on his couch watching TV, and noticed pain in the left chest when he got up to use the restroom.  Pain was worse with certain positions and with breathing.  He continued to have this discomfort throughout the course of the day and eventually came in for evaluation.  He denies any recent cough or fever, had not been very short of breath until some mild dyspnea today, and denies any recent lower extremity swelling.  Denies any history of pneumothorax and denies history of emphysema.  States that he quit smoking a long time ago.  ED Course: Upon arrival to the ED, patient is found to be afebrile, saturating 90% on room air, slightly tachypneic, and with vitals otherwise normal.  EKG features a sinus rhythm with RBBB.  Chest x-ray is concerning for moderate to large left-sided pneumothorax without mediastinal shift, and with superimposed left pleural effusion or consolidation.  Chemistry panel is notable for creatinine 1.47, similar to priors.  CBC is unremarkable.  BNP is elevated to 239 and troponin undetectable.  Patient was treated with morphine and a chest tube was placed by the ED physician.  Critical care was consulted for management of the chest tube and recommended a medical admission.    Review of Systems:  All other  systems reviewed and apart from HPI, are negative.  Past Medical History:  Diagnosis Date  . Abdominal aortic aneurysm (AAA) ([place])   . Arthritis    "my whole body" (03/19/2018)  . CAD (coronary artery disease)   . Chronic anticoagulation    on coumadin for PE  . Chronic lower back pain   . GERD (gastroesophageal reflux disease)   . High cholesterol   . Hypertension   . Hypothyroidism   . OSA on CPAP   . Pneumonia    "now and once before" (03/19/2018)  . Pulmonary embolism [facility])    April 2012 after CABG  . S/P CABG (coronary artery bypass graft)     Past Surgical History:  Procedure Laterality Date  . CARDIAC CATHETERIZATION  11/2010  . CORONARY ANGIOGRAPHY N/A 10/22/2018   Procedure: CORONARY ANGIOGRAPHY;  Surgeon: Troy Sine, MD;  Location: [facility];  Service: Cardiovascular;  Laterality: N/A;  . CORONARY ARTERY BYPASS GRAFT  March 2012   CABG X 5  . INGUINAL HERNIA REPAIR Left 1980  . INTRAVASCULAR PRESSURE WIRE/FFR STUDY N/A 10/22/2018   Procedure: INTRAVASCULAR PRESSURE WIRE/FFR STUDY;  Surgeon: Troy Sine, MD;  Location: [place] [facility];  Service: Cardiovascular;  Laterality: N/A;  . KNEE ARTHROSCOPY Left 2006  . LEFT HEART CATH AND CORS/GRAFTS ANGIOGRAPHY N/A 10/18/2018   Procedure: LEFT HEART CATH AND CORS/GRAFTS ANGIOGRAPHY;  Surgeon: Lorretta Harp, MD;  Location: [place] [facility];  Service: Cardiovascular;  Laterality: N/A;  . LEFT HEART CATH AND CORS/GRAFTS ANGIOGRAPHY N/A 10/21/2018  Procedure: LEFT HEART CATH AND CORS/GRAFTS ANGIOGRAPHY;  Surgeon: Troy Sine, MD;  Location: [place] [facility];  Service: Cardiovascular;  Laterality: N/A;  . PENILE PROSTHESIS IMPLANT    . RIGHT HEART CATHETERIZATION N/A 11/14/2013   Procedure: RIGHT HEART CATH;  Surgeon: Larey Dresser, MD;  Location: [facility] C[facility] LAB;  Service: Cardiovascular;  Laterality: N/A;  . SHOULDER OPEN ROTATOR CUFF REPAIR Right 2003     reports that he quit smoking about 34 years  ago. His smoking use included cigarettes. He has a 35.00 pack-year smoking history. He has never used smokeless tobacco. He reports current alcohol use of about 2.0 standard drinks of alcohol per week. He reports that he does not use drugs.  Allergies  Allergen Reactions  . Ancef [Cefazolin Sodium] Hives  . Vancomycin Rash    Family History  Problem Relation Age of Onset  . Coronary artery disease Mother   . Hypertension Mother   . Heart disease Mother      Prior to Admission medications   Medication Sig Start Date End Date Taking? Authorizing Provider  acetaminophen (TYLENOL) 500 MG tablet Take 1,000 mg by mouth every 6 (six) hours as needed for mild pain.    [provider]  amiodarone (PACERONE) 200 MG tablet Take 200mg  (1 tab) twice daily for 10 days only. 11/09/18   Georgiana Shore, NP  apixaban (ELIQUIS) 5 MG TABS tablet Take 1 tablet (5 mg total) by mouth 2 (two) times daily. 11/01/18   Larey Dresser, MD  Ascorbic Acid 500 MG CAPS Take 500 mg by mouth daily.    [provider]  atorvastatin (LIPITOR) 80 MG tablet Take 1 tablet (80 mg total) by mouth daily. 11/01/18   Larey Dresser, MD  carvedilol (COREG) 6.25 MG tablet Take 3.125 mg in AM and 6.25 mg in PM 11/19/18   Georgiana Shore, NP  cetirizine (ZYRTEC) 10 MG tablet Take 10 mg by mouth daily.      [provider]  cholecalciferol (VITAMIN D) 1000 UNITS tablet Take 1,000 Units by mouth daily.      [provider]  furosemide (LASIX) 40 MG tablet Take 40 mg (1 tab) twice daily x 2 days, then start 40 mg (1 tab) DAILY. Take additional tab as directed by HF clinic 11/01/18   Larey Dresser, MD  isosorbide mononitrate (IMDUR) 30 MG 24 hr tablet Take 3 tablets (90 mg total) by mouth daily. 11/01/18   Larey Dresser, MD  levothyroxine (SYNTHROID, LEVOTHROID) 50 MCG tablet Take 50 mcg by mouth daily.    [provider]  Multiple Vitamins-Minerals (CENTRUM SILVER PO) Take 1 tablet by  mouth daily.     [provider]  pantoprazole (PROTONIX) 40 MG tablet Take 40 mg by mouth daily.     [provider]  tamsulosin (FLOMAX) 0.4 MG CAPS capsule Take 0.4 mg by mouth daily after supper.     [provider]  vitamin E 400 UNIT capsule Take 400 Units by mouth daily.    [provider]    Physical Exam: Vitals:   01/09/19 1830 01/09/19 1845 01/09/19 1900 01/09/19 1915  BP: 133/78 (!) 141/96 (!) 113/54 102/62  Pulse: 66 76 65 66  Resp: (!) 22 20 (!) 22 20  Temp:      TempSrc:      SpO2: 97% 97% 96% 97%  Weight:      Height:        Constitutional:  NAD, calm  Eyes: PERTLA, lids and conjunctivae normal ENMT: Mucous membranes are moist. Posterior pharynx clear of any exudate or lesions.   Neck: normal, supple, no masses, no thyromegaly Respiratory: no wheezing, no crackles. No accessory muscle use. Chest tube draining clear yellow fluid. Cardiovascular: S1 & S2 heard, regular rate and rhythm. No extremity edema.   Abdomen: No distension, no tenderness, soft. Bowel sounds normal.  Musculoskeletal: no clubbing / cyanosis. No joint deformity upper and lower extremities.   Skin: no significant rashes, lesions, ulcers. Warm, dry, well-perfused. Neurologic: CN 2-12 grossly intact. Sensation intact. Strength 5/5 in all 4 limbs.  Psychiatric: Alert and oriented x 3. Pleasant, cooperative.    Labs on Admission: I have personally reviewed following labs and imaging studies  CBC: Recent Labs  Lab 01/09/19 1555  WBC 8.3  HGB 15.0  HCT 46.5  MCV 97.3  PLT 175   Basic Metabolic Panel: Recent Labs  Lab 01/09/19 1555  NA 138  K 3.9  CL 98  CO2 28  GLUCOSE 117*  BUN 19  CREATININE 1.47*  CALCIUM 9.4   GFR: Estimated Creatinine Clearance: 42.6 mL/min (A) (by C-G formula based on SCr of 1.47 mg/dL (H)). Liver Function Tests: No results for input(s): AST, ALT, ALKPHOS, BILITOT, PROT, ALBUMIN in the last 168 hours. No results for  input(s): LIPASE, AMYLASE in the last 168 hours. No results for input(s): AMMONIA in the last 168 hours. Coagulation Profile: No results for input(s): INR, PROTIME in the last 168 hours. Cardiac Enzymes: Recent Labs  Lab 01/09/19 1555  TROPONINI <0.03   BNP (last 3 results) No results for input(s): PROBNP in the last 8760 hours. HbA1C: No results for input(s): HGBA1C in the last 72 hours. CBG: No results for input(s): GLUCAP in the last 168 hours. Lipid Profile: No results for input(s): CHOL, HDL, LDLCALC, TRIG, CHOLHDL, LDLDIRECT in the last 72 hours. Thyroid Function Tests: No results for input(s): TSH, T4TOTAL, FREET4, T3FREE, THYROIDAB in the last 72 hours. Anemia Panel: No results for input(s): VITAMINB12, FOLATE, FERRITIN, TIBC, IRON, RETICCTPCT in the last 72 hours. Urine analysis:    Component Value Date/Time   COLORURINE AMBER (A) 03/19/2018 1456   APPEARANCEUR HAZY (A) 03/19/2018 1456   LABSPEC 1.027 03/19/2018 1456   PHURINE 5.0 03/19/2018 1456   GLUCOSEU NEGATIVE 03/19/2018 1456   HGBUR NEGATIVE 03/19/2018 1456   BILIRUBINUR NEGATIVE 03/19/2018 1456   KETONESUR 5 (A) 03/19/2018 1456   PROTEINUR 100 (A) 03/19/2018 1456   UROBILINOGEN 0.2 12/23/2010 2026   NITRITE NEGATIVE 03/19/2018 1456   LEUKOCYTESUR NEGATIVE 03/19/2018 1456   Sepsis Labs: @LABRCNTIP (procalcitonin:4,lacticidven:4) )No results found for this or any previous visit (from the past 240 hour(s)).   Radiological Exams on Admission: Dg Chest Port 1 View  Addendum Date: 01/09/2019   ADDENDUM REPORT: 01/09/2019 17:35 ADDENDUM: Critical Value/emergent results were called by telephone at the time of interpretation on 01/09/2019 at 1731 hours to Dr. Noemi Chapel who verbally acknowledged these results. Electronically Signed   By: Genevie Ann M.D.   On: 01/09/2019 17:35   Result Date: 01/09/2019 CLINICAL DATA:  85 year old male with left chest pain. EXAM: PORTABLE CHEST 1 VIEW COMPARISON:  Chest CT and  radiographs 10/24/2018 and earlier. FINDINGS: Portable AP upright view at 1657 hours. Moderate to large left pneumothorax with superimposed lung base pleural effusion and/or consolidation. Other atelectatic lung is gathered in the medial left hemithorax. Stable cardiac size and mediastinal contours. Prior CABG. Visualized tracheal air column is within normal  limits. No midline shift. No left rib fracture is evident. The right lung appears stable in clear. Negative visible bowel gas pattern. IMPRESSION: 1. Moderate to large left pneumothorax but no mediastinal shift to suggest a component of tension. 2. Superimposed left pleural effusion and/or lung base consolidation. 3. No left rib fracture or other acute cardiopulmonary abnormality identified. Electronically Signed: By: Genevie Ann M.D. On: 01/09/2019 17:28    EKG: Independently reviewed. Sinus rhythm.   Assessment/Plan   1. Pneumothorax  - Presents with chest pain and found to have moderate-large left pleural effusion; pt denies hx of PTX and etiology not clear  - Chest tube placed in ED with reexpansion  - PCCM consulting for chest tube mgmt and much appreciated  - Continue pain-control, supportive care    2. Pleural effusion  - Straw colored fluid removed during chest tube insertion  - No infectious s/s on admission, and does not appear hypervolemic  - Send fluid for cell counts, GS and cx, LDH and protein, and cytology    3. CAD  - Chest pain complaints are secondary to PTX  - Plan to continue statin and beta-blocker pending pharmacy med-rec   4. Chronic diastolic CHF  - Appears compensated  - Follow daily wt and I/O's    5. Atrial fibrillation  - In sinus rhythm on admission  - CHADS-VASc is at least 4 (CHF, CAD, age x2)  - Had been on Eliquis and amiodarone; plan to continue pending pharmacy med-rec    6. CKD III  - SCr is 1.47 on admission, similar to priors  - Renally-dose medications    PPE: Mask, face shield; pt in mask   DVT prophylaxis: Eliquis  Code Status: Full  Family Communication: Discussed with patient  Consults called: PCCM  Admission status: Observation     Vianne Bulls, MD [facility] Pager [phone]  If 7PM-7AM, please contact night-coverage [email or web address] Password [facility]  01/09/2019, 8:12 PM

## 2019-01-09 NOTE — Consult Note (Signed)
NAME:  Eric Gordon, MRN:  [id number], DOB:  10-25-1930, LOS: 0 ADMISSION DATE:  01/09/2019, CONSULTATION DATE:  01/09/2019 REFERRING MD:  Noemi Chapel, MD , CHIEF COMPLAINT:  Chest pain     History of present illness   Eric Gordon is an 85 year old gentleman with history significant for obstructive sleep apnea on CPAP, GERD, CAD status post CABG hypertension, hyperlipidemia and hypothyroidism who presented to the ED with left-sided chest pain.  Patient said that he was in his usual state of health until only this morning.  Said that she developed sharp pain on the left side.  The pain was persistent.  Intensity of 9/10.  It was not radiating.  He has had an MI in the past and this was not similar to the pain he had when he had acute MI in the past.  He denies any cough or sputum production.  He denies fever or chills.  He denies any associated shortness of breath.  States that he has no known lung disease.  However he has had a pneumonia in the past and whenever he had a pneumonia he had the pleural effusion that did not require thoracentesis or chest tube placement.  Due to persistent pain he presented to the ED where a chest x-ray done showed left-sided hydropneumothorax.  Chest tube was placed in the ED.  PCCM is consulted for chest tube management.   Past Medical History  -OSA on CPAP -GERD -CAD status post CABG -Hypertension -Hyperlipidemia -Hypothyroidism  Consults:  -PCCM  Procedures:  -Chest tube on 4/19  Significant Diagnostic Tests:  Chest x-ray-left hydropneumothorax  Micro Data:  Pending  Antimicrobials:  None  Objective   Blood pressure 102/62, pulse 66, temperature 98.3 F (36.8 C), temperature source Oral, resp. rate 20, height 5\' 10"  (1.778 m), weight 95.3 kg, SpO2 97 %.       No intake or output data in the 24 hours ending 01/09/19 2005 Filed Weights   01/09/19 1544  Weight: 95.3 kg    Examination: General: Awake alert and oriented, lying in  bed, no acute distress, HENT: Normocephalic Lungs: Decreased air entry left base Cardiovascular: S1-S2 normal no added sounds Abdomen: Soft nontender no organomegaly Extremities: No lower extremity edema, no deformities Neuro: No focal neurological deficits   Assessment & Plan:  #Left Hydropneumothorax Patient with a spontaneous left sided pneumothorax.  Also has a pleural effusion on imaging.  He had a CT scan done in February of this year and at that time his lung parenchyma appeared normal.  He has a small cyst/bullae on his right lung parenchyma.  He also had a left lobe consolidation and a small effusion at that time.  No other parenchymal disease that would explain etiology of spontaneous pneumothorax.  He has not had any trauma.  He has had a chest tube placed in the ED with drainage of serous fluid.  We are consulted for chest tube management. -Follow-up fluid studies to determine if fluid is exudative or infected.  Suspect that this is a parapneumonic effusion from previous pneumonia. -Maintain chest tube to -20 cm of suction -Repeat chest x-ray in the a.m. to evaluate for persistence of pneumothorax.  We will then consider putting to waterseal. -Will likely need a repeat CT prior to discharge to evaluate for any parenchymal lung abnormalities.   Labs   CBC: Recent Labs  Lab 01/09/19 1555  WBC 8.3  HGB 15.0  HCT 46.5  MCV 97.3  PLT 170  Basic Metabolic Panel: Recent Labs  Lab 01/09/19 1555  NA 138  K 3.9  CL 98  CO2 28  GLUCOSE 117*  BUN 19  CREATININE 1.47*  CALCIUM 9.4   GFR: Estimated Creatinine Clearance: 42.6 mL/min (A) (by C-G formula based on SCr of 1.47 mg/dL (H)). Recent Labs  Lab 01/09/19 1555  WBC 8.3   ABG    Component Value Date/Time   PHART 7.417 11/14/2013 0820   PCO2ART 46.1 (H) 11/14/2013 0820   PO2ART 50.0 (L) 11/14/2013 0820   HCO3 25.6 10/21/2018 0531   TCO2 27 10/21/2018 0531   ACIDBASEDEF 1.0 12/11/2010 1902   O2SAT 53.0  10/21/2018 0531     Coagulation Profile: No results for input(s): INR, PROTIME in the last 168 hours.  Cardiac Enzymes: Recent Labs  Lab 01/09/19 1555  TROPONINI <0.03    HbA1C: Hgb A1c MFr Bld  Date/Time Value Ref Range Status  12/06/2010 06:25 AM (H) <5.7 % Final   5.8 (NOTE)                                                                       According to the ADA Clinical Practice Recommendations for 2011, when HbA1c is used as a screening test:   >=6.5%   Diagnostic of Diabetes Mellitus           (if abnormal result  is confirmed)  5.7-6.4%   Increased risk of developing Diabetes Mellitus  References:Diagnosis and Classification of Diabetes Mellitus,Diabetes IWPY,0998,33(ASNKN 1):S62-S69 and Standards of Medical Care in         Diabetes - 2011,Diabetes Care,2011,34  (Suppl 1):S11-S61.     Review of Systems:   Review of Systems  Constitutional: Negative for chills and fever.  Eyes: Negative for blurred vision.  Respiratory: Negative for cough, sputum production and shortness of breath.   Cardiovascular: Positive for chest pain. Negative for orthopnea and leg swelling.  Gastrointestinal: Negative for nausea and vomiting.  Genitourinary: Negative for dysuria.  Musculoskeletal: Negative for neck pain.  Neurological: Negative for dizziness and headaches.  Endo/Heme/Allergies: Does not bruise/bleed easily.    Past Medical History  He,  has a past medical history of Abdominal aortic aneurysm (AAA) ([place]), Arthritis, CAD (coronary artery disease), Chronic anticoagulation, Chronic lower back pain, GERD (gastroesophageal reflux disease), High cholesterol, Hypertension, Hypothyroidism, OSA on CPAP, Pneumonia, Pulmonary embolism ([place]), and S/P CABG (coronary artery bypass graft).   Surgical History    Past Surgical History:  Procedure Laterality Date  . CARDIAC CATHETERIZATION  11/2010  . CORONARY ANGIOGRAPHY N/A 10/22/2018   Procedure: CORONARY ANGIOGRAPHY;  Surgeon: Troy Sine, MD;  Location: [facility];  Service: Cardiovascular;  Laterality: N/A;  . CORONARY ARTERY BYPASS GRAFT  March 2012   CABG X 5  . INGUINAL HERNIA REPAIR Left 1980  . INTRAVASCULAR PRESSURE WIRE/FFR STUDY N/A 10/22/2018   Procedure: INTRAVASCULAR PRESSURE WIRE/FFR STUDY;  Surgeon: Troy Sine, MD;  Location: [facility];  Service: Cardiovascular;  Laterality: N/A;  . KNEE ARTHROSCOPY Left 2006  . LEFT HEART CATH AND CORS/GRAFTS ANGIOGRAPHY N/A 10/18/2018   Procedure: LEFT HEART CATH AND CORS/GRAFTS ANGIOGRAPHY;  Surgeon: Lorretta Harp, MD;  Location: [facility];  Service: Cardiovascular;  Laterality: N/A;  . LEFT HEART CATH AND CORS/GRAFTS ANGIOGRAPHY N/A 10/21/2018   Procedure: LEFT HEART CATH AND CORS/GRAFTS ANGIOGRAPHY;  Surgeon: Troy Sine, MD;  Location: [facility];  Service: Cardiovascular;  Laterality: N/A;  . PENILE PROSTHESIS IMPLANT    . RIGHT HEART CATHETERIZATION N/A 11/14/2013   Procedure: RIGHT HEART CATH;  Surgeon: Larey Dresser, MD;  Location: [facility];  Service: Cardiovascular;  Laterality: N/A;  . SHOULDER OPEN ROTATOR CUFF REPAIR Right 2003     Social History   reports that he quit smoking about 34 years ago. His smoking use included cigarettes. He has a 35.00 pack-year smoking history. He has never used smokeless tobacco. He reports current alcohol use of about 2.0 standard drinks of alcohol per week. He reports that he does not use drugs.   Family History   His family history includes Coronary artery disease in his mother; Heart disease in his mother; Hypertension in his mother.   Allergies Allergies  Allergen Reactions  . Ancef [Cefazolin Sodium] Hives  . Vancomycin Rash     Home Medications  Prior to Admission medications   Medication Sig Start Date End Date Taking? Authorizing Provider  acetaminophen (TYLENOL) 500 MG tablet Take 1,000 mg by mouth every 6 (six) hours as needed for mild pain.    [provider]  amiodarone (PACERONE) 200 MG tablet Take 200mg  (1 tab) twice daily for 10 days only. 11/09/18   Georgiana Shore, NP  apixaban (ELIQUIS) 5 MG TABS tablet Take 1 tablet (5 mg total) by mouth 2 (two) times daily. 11/01/18   Larey Dresser, MD  Ascorbic Acid 500 MG CAPS Take 500 mg by mouth daily.    [provider]  atorvastatin (LIPITOR) 80 MG tablet Take 1 tablet (80 mg total) by mouth daily. 11/01/18   Larey Dresser, MD  carvedilol (COREG) 6.25 MG tablet Take 3.125 mg in AM and 6.25 mg in PM 11/19/18   Georgiana Shore, NP  cetirizine (ZYRTEC) 10 MG tablet Take 10 mg by mouth daily.      [provider]  cholecalciferol (VITAMIN D) 1000 UNITS tablet Take 1,000 Units by mouth daily.      [provider]  furosemide (LASIX) 40 MG tablet Take 40 mg (1 tab) twice daily x 2 days, then start 40 mg (1 tab) DAILY. Take additional tab as directed by HF clinic 11/01/18   Larey Dresser, MD  isosorbide mononitrate (IMDUR) 30 MG 24 hr tablet Take 3 tablets (90 mg total) by mouth daily. 11/01/18   Larey Dresser, MD  levothyroxine (SYNTHROID, LEVOTHROID) 50 MCG tablet Take 50 mcg by mouth daily.    [provider]  Multiple Vitamins-Minerals (CENTRUM SILVER PO) Take 1 tablet by mouth daily.     [provider]  pantoprazole (PROTONIX) 40 MG tablet Take 40 mg by mouth daily.     [provider]  tamsulosin (FLOMAX) 0.4 MG CAPS capsule Take 0.4 mg by mouth daily after supper.     [provider]  vitamin E 400 UNIT capsule Take 400 Units by mouth daily.    [provider]     Thank you for this consult and for allowing [place] to participate in the care of your patient.  Pulmonary will continue to follow along with you.  Oswald Hillock, MD PCCM [facility]

## 2019-01-09 NOTE — ED Triage Notes (Signed)
Pt BIB POV with c/o CP started this am. Had MI back in January of this year, states this pain is entirely different however. Describes at aching with occasional sharp pains. Currently wearing Zio patch from Dr. Oleh Genin office for palpitations. Pain came on after he wopke up and started his day.  Around 0700. Nothing makes pain better or worse, intermittent without pattern. Pain 8/10

## 2019-01-09 NOTE — ED Provider Notes (Signed)
Medical screening examination/treatment/procedure(s) were conducted as a shared visit with non-physician practitioner(s) and myself.  I personally evaluated the patient during the encounter.  Clinical Impression:   Final diagnoses:  Pneumothorax, left    The patient presents with acute onset of left-sided chest pain.  According to the medical record he had been admitted within the last couple of months because of pneumonia with an effusion at the left base, he now has a pneumothorax on the same side.  On exam he has decreased lung sounds but is in no distress.  I personally placed the chest tube, see below, will consult with critical care and hospitalist to manage.  Fluid removed for evaluation of the effusion.  The patient was seen and evaluated, the post chest tube picture x-ray is supportive of good tube placement and successful reduction of pneumothorax.  The patient is doing very well, will need to be admitted to the hospital.  I personally looked at and interpreted the x-ray.  I agree with the radiologist.  I discussed the case with Dr Deterding of PCCM - they will follow the chest tube and help with care of this part of the process - Hospitalist to admit  CHEST TUBE INSERTION Date/Time: 01/09/2019 7:06 PM Performed by: Noemi Chapel, MD Authorized by: Noemi Chapel, MD   Consent:    Consent obtained:  Verbal and written   Consent given by:  Patient   Risks discussed:  Bleeding, incomplete drainage, nerve damage, damage to surrounding structures, infection and pain   Alternatives discussed:  Alternative treatment Pre-procedure details:    Skin preparation:  ChloraPrep   Preparation: Patient was prepped and draped in the usual sterile fashion   Anesthesia (see MAR for exact dosages):    Anesthesia method:  Local infiltration   Local anesthetic:  Lidocaine 1% w/o epi Procedure details:    Placement location:  L lateral   Scalpel size:  11   Tube size (Fr):  Minicatheter  Ultrasound guidance: no     Tension pneumothorax: no     Tube connected to:  Water seal   Drainage characteristics:  Yellow   Suture material: 3-0 silk,   Dressing:  4x4 sterile gauze Post-procedure details:    Post-insertion x-ray findings: tube in good position     Patient tolerance of procedure:  Tolerated well, no immediate complications Comments:            Noemi Chapel, MD 01/10/19 1146

## 2019-01-09 NOTE — ED Provider Notes (Signed)
[facility] EMERGENCY DEPARTMENT Provider Note   CSN: [id number] Arrival date & time: 01/09/19  1536    History   Chief Complaint Chief Complaint  Patient presents with  . Chest Pain    HPI Eric Gordon is a 85 y.o. male.     The history is provided by the patient and medical records. No language interpreter was used.  Chest Pain     85 year old male with significant history of CAD status post CABG 3/12, NSTEMI 1/20, Prior PE on Eliquis presenting for evaluation of chest pain.  Patient developed gradual onset of pain to left side of chest ongoing since 9 AM this morning.  Described pain as a achy sensation, radiates to his left shoulder with associated lightheadedness and shortness of breath.  States shortness of breath is not new.  Rates pain as 9 out of 10.  No associated fever or chills, no productive cough, no cold symptoms, no nausea vomiting diarrhea or diaphoresis.  States pain felt different from his chest pain when he was diagnosed with heart attack earlier in the year.  He denies any specific treatment tried.  He was last evaluated by his cardiologist 4 days ago via telemedicine.  Per note from cardiologist patient has had mild atypical chest pain ongoing for about 2 weeks which was felt was not related to cardiac etiology.  Patient also has been having bouts of presyncope, likely orthostatic. He is now wearing a Zio patch.    Past Medical History:  Diagnosis Date  . Abdominal aortic aneurysm (AAA) ([place])   . Arthritis    "my whole body" (03/19/2018)  . CAD (coronary artery disease)   . Chronic anticoagulation    on coumadin for PE  . Chronic lower back pain   . GERD (gastroesophageal reflux disease)   . High cholesterol   . Hypertension   . Hypothyroidism   . OSA on CPAP   . Pneumonia    "now and once before" (03/19/2018)  . Pulmonary embolism [facility])    April 2012 after CABG  . S/P CABG (coronary artery bypass graft)     Patient Active  Problem List   Diagnosis Date Noted  . NSTEMI (non-ST elevated myocardial infarction) ([place]) 10/21/2018  . Acute diastolic CHF (congestive heart failure) ([place])   . Non-ST elevation (NSTEMI) myocardial infarction ([place]) 10/17/2018  . CAP (community acquired pneumonia) 03/19/2018  . Hoarseness 12/15/2013  . Mild aortic stenosis 11/03/2012  . Atrial fibrillation ([place]) 04/02/2011  . Carotid stenosis, asymptomatic 04/02/2011  . Coronary artery disease 01/10/2011  . Abdominal aortic aneurysm ([place]) 11/06/2010  . CHEST TIGHTNESS-PRESSURE-OTHER 11/01/2010  . Asthma, mild intermittent 04/10/2010  . DYSPNEA 02/26/2010  . OBSTRUCTIVE SLEEP APNEA 08/22/2008  . Essential hypertension 08/22/2008  . BARRETTS ESOPHAGUS 08/18/2008  . HIATAL HERNIA 08/18/2008  . DIVERTICULOSIS, COLON 08/18/2008  . COLONIC POLYPS, HX OF 08/18/2008    Past Surgical History:  Procedure Laterality Date  . CARDIAC CATHETERIZATION  11/2010  . CORONARY ANGIOGRAPHY N/A 10/22/2018   Procedure: CORONARY ANGIOGRAPHY;  Surgeon: Troy Sine, MD;  Location: [place] CV LAB;  Service: Cardiovascular;  Laterality: N/A;  . CORONARY ARTERY BYPASS GRAFT  March 2012   CABG X 5  . INGUINAL HERNIA REPAIR Left 1980  . INTRAVASCULAR PRESSURE WIRE/FFR STUDY N/A 10/22/2018   Procedure: INTRAVASCULAR PRESSURE WIRE/FFR STUDY;  Surgeon: Troy Sine, MD;  Location: [facility] CV LAB;  Service: Cardiovascular;  Laterality: N/A;  . KNEE ARTHROSCOPY Left 2006  .  LEFT HEART CATH AND CORS/GRAFTS ANGIOGRAPHY N/A 10/18/2018   Procedure: LEFT HEART CATH AND CORS/GRAFTS ANGIOGRAPHY;  Surgeon: Lorretta Harp, MD;  Location: [facility] CV LAB;  Service: Cardiovascular;  Laterality: N/A;  . LEFT HEART CATH AND CORS/GRAFTS ANGIOGRAPHY N/A 10/21/2018   Procedure: LEFT HEART CATH AND CORS/GRAFTS ANGIOGRAPHY;  Surgeon: Troy Sine, MD;  Location: [place] CV LAB;  Service: Cardiovascular;  Laterality: N/A;  . PENILE PROSTHESIS IMPLANT    .  RIGHT HEART CATHETERIZATION N/A 11/14/2013   Procedure: RIGHT HEART CATH;  Surgeon: Larey Dresser, MD;  Location: [facility] CATH LAB;  Service: Cardiovascular;  Laterality: N/A;  . SHOULDER OPEN ROTATOR CUFF REPAIR Right 2003        Home Medications    Prior to Admission medications   Medication Sig Start Date End Date Taking? Authorizing Provider  acetaminophen (TYLENOL) 500 MG tablet Take 1,000 mg by mouth every 6 (six) hours as needed for mild pain.    [provider]  amiodarone (PACERONE) 200 MG tablet Take 200mg  (1 tab) twice daily for 10 days only. 11/09/18   Georgiana Shore, NP  apixaban (ELIQUIS) 5 MG TABS tablet Take 1 tablet (5 mg total) by mouth 2 (two) times daily. 11/01/18   Larey Dresser, MD  Ascorbic Acid 500 MG CAPS Take 500 mg by mouth daily.    [provider]  atorvastatin (LIPITOR) 80 MG tablet Take 1 tablet (80 mg total) by mouth daily. 11/01/18   Larey Dresser, MD  carvedilol (COREG) 6.25 MG tablet Take 3.125 mg in AM and 6.25 mg in PM 11/19/18   Georgiana Shore, NP  cetirizine (ZYRTEC) 10 MG tablet Take 10 mg by mouth daily.      [provider]  cholecalciferol (VITAMIN D) 1000 UNITS tablet Take 1,000 Units by mouth daily.      [provider]  furosemide (LASIX) 40 MG tablet Take 40 mg (1 tab) twice daily x 2 days, then start 40 mg (1 tab) DAILY. Take additional tab as directed by HF clinic 11/01/18   Larey Dresser, MD  isosorbide mononitrate (IMDUR) 30 MG 24 hr tablet Take 3 tablets (90 mg total) by mouth daily. 11/01/18   Larey Dresser, MD  levothyroxine (SYNTHROID, LEVOTHROID) 50 MCG tablet Take 50 mcg by mouth daily.    [provider]  Multiple Vitamins-Minerals (CENTRUM SILVER PO) Take 1 tablet by mouth daily.     [provider]  pantoprazole (PROTONIX) 40 MG tablet Take 40 mg by mouth daily.     [provider]  tamsulosin (FLOMAX) 0.4 MG CAPS capsule Take 0.4 mg by mouth daily after supper.      [provider]  vitamin E 400 UNIT capsule Take 400 Units by mouth daily.    [provider]    Family History Family History  Problem Relation Age of Onset  . Coronary artery disease Mother   . Hypertension Mother   . Heart disease Mother     Social History Social History   Tobacco Use  . Smoking status: Former Smoker    Packs/day: 1.00    Years: 35.00    Pack years: 35.00    Types: Cigarettes    Last attempt to quit: 02/18/1984    Years since quitting: 34.9  . Smokeless tobacco: Never Used  Substance Use Topics  . Alcohol use: Yes    Alcohol/week: 2.0 standard drinks    Types: 2 Standard drinks or  equivalent per week  . Drug use: Never     Allergies   Ancef [cefazolin sodium] and Vancomycin   Review of Systems Review of Systems  Cardiovascular: Positive for chest pain.  All other systems reviewed and are negative.    Physical Exam Updated Vital Signs BP 111/73   Pulse 63   Temp 98.3 F (36.8 C) (Oral)   Resp (!) 32   Ht 5\' 10"  (1.778 m)   Wt 95.3 kg   SpO2 90%   BMI 30.13 kg/m   Physical Exam Vitals signs and nursing note reviewed.  Constitutional:      General: He is not in acute distress.    Appearance: He is well-developed.  HENT:     Head: Atraumatic.  Eyes:     Conjunctiva/sclera: Conjunctivae normal.  Neck:     Musculoskeletal: Neck supple.  Cardiovascular:     Rate and Rhythm: Normal rate and regular rhythm.     Heart sounds: Normal heart sounds.  Pulmonary:     Effort: Pulmonary effort is normal.     Breath sounds: Examination of the left-upper field reveals decreased breath sounds. Examination of the left-middle field reveals decreased breath sounds. Examination of the left-lower field reveals decreased breath sounds and rales. Decreased breath sounds and rales present. No wheezing or rhonchi.  Chest:     Chest wall: No tenderness.     Comments: Zio patch to L upper chest Abdominal:     Palpations: Abdomen is  soft.  Musculoskeletal:     Right lower leg: No edema.     Left lower leg: No edema.  Skin:    Findings: No rash.  Neurological:     Mental Status: He is alert and oriented to person, place, and time.      ED Treatments / Results  Labs (all labs ordered are listed, but only abnormal results are displayed) Labs Reviewed  BASIC METABOLIC PANEL - Abnormal; Notable for the following components:      Result Value   Glucose, Bld 117 (*)    Creatinine, Ser 1.47 (*)    GFR calc non Af Amer 43 (*)    GFR calc Af Amer 50 (*)    All other components within normal limits  BRAIN NATRIURETIC PEPTIDE - Abnormal; Notable for the following components:   B Natriuretic Peptide 239.2 (*)    All other components within normal limits  CBC  TROPONIN I    EKG EKG Interpretation  Date/Time:  Sunday January 09 2019 15:43:22 EDT Ventricular Rate:  79 PR Interval:    QRS Duration: 175 QT Interval:  503 QTC Calculation: 577 R Axis:   -92 Text Interpretation:  Sinus rhythm Borderline prolonged PR interval Right bundle branch block since last tracing no significant change Confirmed by Noemi Chapel [phone]) on 01/09/2019 5:19:18 PM   ED ECG REPORT   Date: 01/09/2019  Rate: 79  Rhythm: normal sinus rhythm  QRS Axis: left  Intervals: borderline prolonged PR interval  ST/T Wave abnormalities: nonspecific ST changes  Conduction Disutrbances:right bundle branch block  Narrative Interpretation:   Old EKG Reviewed: unchanged  I have personally reviewed the EKG tracing and agree with the computerized printout as noted.   Radiology Dg Chest Port 1 View  Addendum Date: 01/09/2019   ADDENDUM REPORT: 01/09/2019 17:35 ADDENDUM: Critical Value/emergent results were called by telephone at the time of interpretation on 01/09/2019 at 1731 hours to Dr. Noemi Chapel who verbally acknowledged these results. Electronically Signed   By:  Genevie Ann M.D.   On: 01/09/2019 17:35   Result Date: 01/09/2019 CLINICAL DATA:   85 year old male with left chest pain. EXAM: PORTABLE CHEST 1 VIEW COMPARISON:  Chest CT and radiographs 10/24/2018 and earlier. FINDINGS: Portable AP upright view at 1657 hours. Moderate to large left pneumothorax with superimposed lung base pleural effusion and/or consolidation. Other atelectatic lung is gathered in the medial left hemithorax. Stable cardiac size and mediastinal contours. Prior CABG. Visualized tracheal air column is within normal limits. No midline shift. No left rib fracture is evident. The right lung appears stable in clear. Negative visible bowel gas pattern. IMPRESSION: 1. Moderate to large left pneumothorax but no mediastinal shift to suggest a component of tension. 2. Superimposed left pleural effusion and/or lung base consolidation. 3. No left rib fracture or other acute cardiopulmonary abnormality identified. Electronically Signed: By: Genevie Ann M.D. On: 01/09/2019 17:28    Procedures Procedures (including critical care time)  Medications Ordered in ED Medications  morphine 4 MG/ML injection 4 mg (4 mg Intravenous Given 01/09/19 1605)  lidocaine-EPINEPHrine (XYLOCAINE W/EPI) 2 %-1:200000 (PF) injection 20 mL (20 mLs Intradermal Given by Other 01/09/19 1849)  morphine 4 MG/ML injection 4 mg (4 mg Intravenous Given 01/09/19 1848)     Initial Impression / Assessment and Plan / ED Course  I have reviewed the triage vital signs and the nursing notes.  Pertinent labs & imaging results that were available during my care of the patient were reviewed by me and considered in my medical decision making (see chart for details).        BP (!) 161/89 (BP Location: Right Arm)   Pulse 83   Temp 98.3 F (36.8 C) (Oral)   Resp (!) 23   Ht 5\' 10"  (1.778 m)   Wt 95.3 kg   SpO2 93%   BMI 30.13 kg/m    Final Clinical Impressions(s) / ED Diagnoses   Final diagnoses:  Pneumothorax, left    ED Discharge Orders    None     3:57 PM Patient here with pain to his left chest.   Pain felt atypical of ACS.  He has had recurrent chest pain for the past 2 weeks, has discussed this with cardiologist 4 days ago.  He also has had NSTEMI 3 months ago.  Last heart cath shows new occlusion of a branch of the SVG/ramus and OM.  Also serial 70% stenoses in the mid/distal RCA.  Given this finding and active chest pain, work-up initiated, will consult cardiology. Heart Pathway score of 6, moderate risk of MACE.   5:26 PM Pt report some improvement of pain with morphine.  SL nitro given.  Will consult cardiology.  EKG and Trop without acute changes.   5:42 PM Portable CXR demonstrates moderate to large left pneumothorax without mediastinal shift.  Superimposed left pleural effusion/consolidation.  Finding was discussed with Dr. Sabra Heck and with pt.  Plan to insert chest tube to help re-inflate lung  7:04 PM Dr. Sabra Heck successfully insert pig tail chest tube to L chest.  Pt tolerates well.  Pleural effusion were noted, sample collected.  Will consult PCCM and Medicine for admission.    7:45 PM PCCM have been consulted and will be managing chest tube.   8:00 PM Appreciate consultation from [facility] Dr. Myna Hidalgo who agrees to see and admit pt.  We have collected sample of pleural fluid, but have not ordered any testing yet.  Will defer testing to hospitalist.  Will obtain post-op xray.  Pt otherwise resting comfortably and report feeling better.     Domenic Moras, PA-C 01/09/19 Stefani Dama, MD 01/10/19 1146

## 2019-01-09 NOTE — ED Notes (Signed)
ED TO INPATIENT HANDOFF REPORT  ED Nurse Name and Phone #: Vilinda Blanks RN [phone]  S Name/Age/Gender Eric Gordon 85 y.o. male Room/Bed: 028C/028C  Code Status   Code Status: Full Code  Home/SNF/Other Home Patient oriented to: self, place, time and situation Is this baseline? Yes   Triage Complete: Triage complete  Chief Complaint Chest Pain  Triage Note Pt BIB POV with c/o CP started this am. Had MI back in January of this year, states this pain is entirely different however. Describes at aching with occasional sharp pains. Currently wearing Zio patch from Dr. Oleh Genin office for palpitations. Pain came on after he wopke up and started his day.  Around 0700. Nothing makes pain better or worse, intermittent without pattern. Pain 8/10    Allergies Allergies  Allergen Reactions  . Ancef [Cefazolin Sodium] Hives  . Vancomycin Rash    Level of Care/Admitting Diagnosis ED Disposition    ED Disposition Condition [facility] Area: [place] [100100]  Level of Care: Progressive [102]  I expect the patient will be discharged within 24 hours: No (not a candidate for 5C-Observation unit)  Covid Evaluation: N/A  Diagnosis: Pneumothorax on left [253664]  Admitting Physician: Vianne Bulls [[id number]]  Attending Physician: Vianne Bulls [[id number]]  PT Class (Do Not Modify): Observation [104]  PT Acc Code (Do Not Modify): Observation [10022]       B Medical/Surgery History Past Medical History:  Diagnosis Date  . Abdominal aortic aneurysm (AAA) ([place])   . Arthritis    "my whole body" (03/19/2018)  . CAD (coronary artery disease)   . Chronic anticoagulation    on coumadin for PE  . Chronic lower back pain   . GERD (gastroesophageal reflux disease)   . High cholesterol   . Hypertension   . Hypothyroidism   . OSA on CPAP   . Pneumonia    "now and once before" (03/19/2018)  . Pulmonary embolism [facility])    April 2012 after  CABG  . S/P CABG (coronary artery bypass graft)    Past Surgical History:  Procedure Laterality Date  . CARDIAC CATHETERIZATION  11/2010  . CORONARY ANGIOGRAPHY N/A 10/22/2018   Procedure: CORONARY ANGIOGRAPHY;  Surgeon: Troy Sine, MD;  Location: [place] CV LAB;  Service: Cardiovascular;  Laterality: N/A;  . CORONARY ARTERY BYPASS GRAFT  March 2012   CABG X 5  . INGUINAL HERNIA REPAIR Left 1980  . INTRAVASCULAR PRESSURE WIRE/FFR STUDY N/A 10/22/2018   Procedure: INTRAVASCULAR PRESSURE WIRE/FFR STUDY;  Surgeon: Troy Sine, MD;  Location: [place] CV LAB;  Service: Cardiovascular;  Laterality: N/A;  . KNEE ARTHROSCOPY Left 2006  . LEFT HEART CATH AND CORS/GRAFTS ANGIOGRAPHY N/A 10/18/2018   Procedure: LEFT HEART CATH AND CORS/GRAFTS ANGIOGRAPHY;  Surgeon: Lorretta Harp, MD;  Location: [place] CV LAB;  Service: Cardiovascular;  Laterality: N/A;  . LEFT HEART CATH AND CORS/GRAFTS ANGIOGRAPHY N/A 10/21/2018   Procedure: LEFT HEART CATH AND CORS/GRAFTS ANGIOGRAPHY;  Surgeon: Troy Sine, MD;  Location: [place] CV LAB;  Service: Cardiovascular;  Laterality: N/A;  . PENILE PROSTHESIS IMPLANT    . RIGHT HEART CATHETERIZATION N/A 11/14/2013   Procedure: RIGHT HEART CATH;  Surgeon: Larey Dresser, MD;  Location: [facility] CATH LAB;  Service: Cardiovascular;  Laterality: N/A;  . SHOULDER OPEN ROTATOR CUFF REPAIR Right 2003     A IV Location/Drains/Wounds Patient Lines/Drains/Airways Status   Active Line/Drains/Airways    Name:  Placement date:   Placement time:   Site:   Days:   Chest Tube 1 Left Pleural   01/09/19    1921    Pleural   less than 1          Intake/Output Last 24 hours No intake or output data in the 24 hours ending 01/09/19 2029  Labs/Imaging Results for orders placed or performed during the hospital encounter of 01/09/19 (from the past 48 hour(s))  Basic metabolic panel     Status: Abnormal   Collection Time: 01/09/19  3:55 PM  Result Value Ref  Range   Sodium 138 135 - 145 mmol/L   Potassium 3.9 3.5 - 5.1 mmol/L   Chloride 98 98 - 111 mmol/L   CO2 28 22 - 32 mmol/L   Glucose, Bld 117 (H) 70 - 99 mg/dL   BUN 19 8 - 23 mg/dL   Creatinine, Ser 1.47 (H) 0.61 - 1.24 mg/dL   Calcium 9.4 8.9 - 10.3 mg/dL   GFR calc non Af Amer 43 (L) >60 mL/min   GFR calc Af Amer 50 (L) >60 mL/min   Anion gap 12 5 - 15    Comment: Performed at [facility], [place] [address], [place], [place] [address]  CBC     Status: None   Collection Time: 01/09/19  3:55 PM  Result Value Ref Range   WBC 8.3 4.0 - 10.5 K/uL   RBC 4.78 4.22 - 5.81 MIL/uL   Hemoglobin 15.0 13.0 - 17.0 g/dL   HCT 46.5 39.0 - 52.0 %   MCV 97.3 80.0 - 100.0 fL   MCH 31.4 26.0 - 34.0 pg   MCHC 32.3 30.0 - 36.0 g/dL   RDW 14.6 11.5 - 15.5 %   Platelets 170 150 - 400 K/uL   nRBC 0.0 0.0 - 0.2 %    Comment: Performed at [facility], [place] [address] [place], [place] [address]  Troponin I - Once     Status: None   Collection Time: 01/09/19  3:55 PM  Result Value Ref Range   Troponin I <0.03 <0.03 ng/mL    Comment: Performed at [facility] [address], [place], [place] [address]  Brain natriuretic peptide     Status: Abnormal   Collection Time: 01/09/19  3:55 PM  Result Value Ref Range   B Natriuretic Peptide 239.2 (H) 0.0 - 100.0 pg/mL    Comment: Performed at [facility] [address], [place], [place] [address]   Dg Chest Portable 1 View  Result Date: 01/09/2019 CLINICAL DATA:  Follow-up pneumothorax following LEFT thoracostomy tube placement. EXAM: PORTABLE CHEST 1 VIEW COMPARISON:  01/09/2019 prior radiographs FINDINGS: A LEFT pigtail thoracostomy tube is noted. A tiny residual LEFT apical/lateral pneumothorax (less than 5%). LEFT basilar consolidation/atelectasis is noted. Cardiomegaly and CABG changes are noted. No acute bony abnormality noted. IMPRESSION: LEFT thoracostomy tube placement with tiny residual LEFT pneumothorax. Continued LEFT  basilar consolidation/atelectasis. Electronically Signed   By: Margarette Canada M.D.   On: 01/09/2019 20:18   Dg Chest Port 1 View  Addendum Date: 01/09/2019   ADDENDUM REPORT: 01/09/2019 17:35 ADDENDUM: Critical Value/emergent results were called by telephone at the time of interpretation on 01/09/2019 at 1731 hours to Dr. Noemi Chapel who verbally acknowledged these results. Electronically Signed   By: Genevie Ann M.D.   On: 01/09/2019 17:35   Result Date: 01/09/2019 CLINICAL DATA:  85 year old male with left chest pain. EXAM:  PORTABLE CHEST 1 VIEW COMPARISON:  Chest CT and radiographs 10/24/2018 and earlier. FINDINGS: Portable AP upright view at 1657 hours. Moderate to large left pneumothorax with superimposed lung base pleural effusion and/or consolidation. Other atelectatic lung is gathered in the medial left hemithorax. Stable cardiac size and mediastinal contours. Prior CABG. Visualized tracheal air column is within normal limits. No midline shift. No left rib fracture is evident. The right lung appears stable in clear. Negative visible bowel gas pattern. IMPRESSION: 1. Moderate to large left pneumothorax but no mediastinal shift to suggest a component of tension. 2. Superimposed left pleural effusion and/or lung base consolidation. 3. No left rib fracture or other acute cardiopulmonary abnormality identified. Electronically Signed: By: Genevie Ann M.D. On: 01/09/2019 17:28    Pending Labs Unresulted Labs (From admission, onward)    Start     Ordered   01/10/19 0500  Comprehensive metabolic panel  Tomorrow morning,   R     01/09/19 2011   01/10/19 0500  CBC WITH DIFFERENTIAL  Tomorrow morning,   R     01/09/19 2011   01/09/19 2012  Lactate dehydrogenase  Add-on,   R     01/09/19 2011   01/09/19 2009  Protein, pleural or peritoneal fluid  Once,   R     01/09/19 2008   01/09/19 2009  Lactate dehydrogenase (pleural or peritoneal fluid)  Once,   R     01/09/19 2008   01/09/19 2009  Body fluid cell count  with differential  Once,   R    Question:  Are there also cytology or pathology orders on this specimen?  Answer:  Yes   01/09/19 2009   01/09/19 2009  Gram stain  Once,   R     01/09/19 2009   01/09/19 2009  Body fluid culture  Once,   R    Question:  Are there also cytology or pathology orders on this specimen?  Answer:  Yes   01/09/19 2009          Vitals/Pain Today's Vitals   01/09/19 1830 01/09/19 1845 01/09/19 1900 01/09/19 1915  BP: 133/78 (!) 141/96 (!) 113/54 102/62  Pulse: 66 76 65 66  Resp: (!) 22 20 (!) 22 20  Temp:      TempSrc:      SpO2: 97% 97% 96% 97%  Weight:      Height:      PainSc:        Isolation Precautions No active isolations  Medications Medications  oxyCODONE-acetaminophen (PERCOCET/ROXICET) 5-325 MG per tablet 1 tablet (has no administration in time range)  sodium chloride flush (NS) 0.9 % injection 3 mL (has no administration in time range)  sodium chloride flush (NS) 0.9 % injection 3 mL (has no administration in time range)  sodium chloride flush (NS) 0.9 % injection 3 mL (has no administration in time range)  0.9 %  sodium chloride infusion (has no administration in time range)  acetaminophen (TYLENOL) tablet 650 mg (has no administration in time range)    Or  acetaminophen (TYLENOL) suppository 650 mg (has no administration in time range)  polyethylene glycol (MIRALAX / GLYCOLAX) packet 17 g (has no administration in time range)  ondansetron (ZOFRAN) tablet 4 mg (has no administration in time range)    Or  ondansetron (ZOFRAN) injection 4 mg (has no administration in time range)  morphine 4 MG/ML injection 3 mg (has no administration in time range)  morphine 4 MG/ML injection 4 mg (  4 mg Intravenous Given 01/09/19 1605)  lidocaine-EPINEPHrine (XYLOCAINE W/EPI) 2 %-1:200000 (PF) injection 20 mL (20 mLs Intradermal Given by Other 01/09/19 1849)  morphine 4 MG/ML injection 4 mg (4 mg Intravenous Given 01/09/19 1848)     Mobility walks Moderate fall risk   Focused Assessments Cardiac Assessment Handoff:    Lab Results  Component Value Date   CKTOTAL 64 06/24/2011   CKMB 1.6 06/24/2011   TROPONINI <0.03 01/09/2019   Lab Results  Component Value Date   DDIMER (H) 12/23/2010    3.94        AT THE INHOUSE ESTABLISHED CUTOFF VALUE OF 0.48 ug/mL FEU, THIS ASSAY HAS BEEN DOCUMENTED IN THE LITERATURE TO HAVE A SENSITIVITY AND NEGATIVE PREDICTIVE VALUE OF AT LEAST 98 TO 99%.  THE TEST RESULT SHOULD BE CORRELATED WITH AN ASSESSMENT OF THE CLINICAL PROBABILITY OF DVT / VTE.   Does the Patient currently have chest pain? No     R Recommendations: See Admitting Provider Note  Report given to:   Additional Notes:  Pt here initially with aching to left chest. CXR showed large left pleural effusion/pneumo [place] chest tube inserted by EDP to gravity. A/O x 4, Independent VSS, on RA.

## 2019-01-10 ENCOUNTER — Observation Stay (HOSPITAL_COMMUNITY): Payer: Medicare Other

## 2019-01-10 DIAGNOSIS — G4733 Obstructive sleep apnea (adult) (pediatric): Secondary | ICD-10-CM | POA: Diagnosis present

## 2019-01-10 DIAGNOSIS — J90 Pleural effusion, not elsewhere classified: Secondary | ICD-10-CM | POA: Diagnosis not present

## 2019-01-10 DIAGNOSIS — J948 Other specified pleural conditions: Secondary | ICD-10-CM | POA: Diagnosis not present

## 2019-01-10 DIAGNOSIS — E785 Hyperlipidemia, unspecified: Secondary | ICD-10-CM | POA: Diagnosis present

## 2019-01-10 DIAGNOSIS — K219 Gastro-esophageal reflux disease without esophagitis: Secondary | ICD-10-CM | POA: Diagnosis present

## 2019-01-10 DIAGNOSIS — E039 Hypothyroidism, unspecified: Secondary | ICD-10-CM | POA: Diagnosis present

## 2019-01-10 DIAGNOSIS — Z79899 Other long term (current) drug therapy: Secondary | ICD-10-CM | POA: Diagnosis not present

## 2019-01-10 DIAGNOSIS — N183 Chronic kidney disease, stage 3 (moderate): Secondary | ICD-10-CM | POA: Diagnosis present

## 2019-01-10 DIAGNOSIS — I5032 Chronic diastolic (congestive) heart failure: Secondary | ICD-10-CM | POA: Diagnosis present

## 2019-01-10 DIAGNOSIS — R0602 Shortness of breath: Secondary | ICD-10-CM | POA: Diagnosis not present

## 2019-01-10 DIAGNOSIS — Z79890 Hormone replacement therapy (postmenopausal): Secondary | ICD-10-CM | POA: Diagnosis not present

## 2019-01-10 DIAGNOSIS — J9383 Other pneumothorax: Secondary | ICD-10-CM | POA: Diagnosis present

## 2019-01-10 DIAGNOSIS — R079 Chest pain, unspecified: Secondary | ICD-10-CM | POA: Diagnosis not present

## 2019-01-10 DIAGNOSIS — Z87891 Personal history of nicotine dependence: Secondary | ICD-10-CM | POA: Diagnosis not present

## 2019-01-10 DIAGNOSIS — Z86711 Personal history of pulmonary embolism: Secondary | ICD-10-CM | POA: Diagnosis not present

## 2019-01-10 DIAGNOSIS — Z7901 Long term (current) use of anticoagulants: Secondary | ICD-10-CM | POA: Diagnosis not present

## 2019-01-10 DIAGNOSIS — I252 Old myocardial infarction: Secondary | ICD-10-CM | POA: Diagnosis not present

## 2019-01-10 DIAGNOSIS — Z8701 Personal history of pneumonia (recurrent): Secondary | ICD-10-CM | POA: Diagnosis not present

## 2019-01-10 DIAGNOSIS — J939 Pneumothorax, unspecified: Secondary | ICD-10-CM | POA: Diagnosis present

## 2019-01-10 DIAGNOSIS — I130 Hypertensive heart and chronic kidney disease with heart failure and stage 1 through stage 4 chronic kidney disease, or unspecified chronic kidney disease: Secondary | ICD-10-CM | POA: Diagnosis present

## 2019-01-10 DIAGNOSIS — I10 Essential (primary) hypertension: Secondary | ICD-10-CM | POA: Diagnosis not present

## 2019-01-10 DIAGNOSIS — J4520 Mild intermittent asthma, uncomplicated: Secondary | ICD-10-CM | POA: Diagnosis present

## 2019-01-10 DIAGNOSIS — E7800 Pure hypercholesterolemia, unspecified: Secondary | ICD-10-CM | POA: Diagnosis present

## 2019-01-10 DIAGNOSIS — Z951 Presence of aortocoronary bypass graft: Secondary | ICD-10-CM | POA: Diagnosis not present

## 2019-01-10 DIAGNOSIS — I480 Paroxysmal atrial fibrillation: Secondary | ICD-10-CM | POA: Diagnosis present

## 2019-01-10 DIAGNOSIS — I2510 Atherosclerotic heart disease of native coronary artery without angina pectoris: Secondary | ICD-10-CM | POA: Diagnosis present

## 2019-01-10 LAB — COMPREHENSIVE METABOLIC PANEL
ALT: 12 U/L (ref 0–44)
AST: 15 U/L (ref 15–41)
Albumin: 3.4 g/dL — ABNORMAL LOW (ref 3.5–5.0)
Alkaline Phosphatase: 89 U/L (ref 38–126)
Anion gap: 9 (ref 5–15)
BUN: 18 mg/dL (ref 8–23)
CO2: 28 mmol/L (ref 22–32)
Calcium: 9 mg/dL (ref 8.9–10.3)
Chloride: 101 mmol/L (ref 98–111)
Creatinine, Ser: 1.24 mg/dL (ref 0.61–1.24)
GFR calc Af Amer: >60 mL/min (ref >60)
GFR calc non Af Amer: 53 mL/min — ABNORMAL LOW (ref >60)
Glucose, Bld: 99 mg/dL (ref 70–99)
Potassium: 4 mmol/L (ref 3.5–5.1)
Sodium: 138 mmol/L (ref 135–145)
Total Bilirubin: 1.2 mg/dL (ref 0.3–1.2)
Total Protein: 6.2 g/dL — ABNORMAL LOW (ref 6.5–8.1)

## 2019-01-10 LAB — BODY FLUID CELL COUNT WITH DIFFERENTIAL
Eos, Fluid: 70 %
Lymphs, Fluid: 11 %
Monocyte-Macrophage-Serous Fluid: 14 % — ABNORMAL LOW (ref 50–90)
Neutrophil Count, Fluid: 5 % (ref 0–25)
Total Nucleated Cell Count, Fluid: 5150 cu mm — ABNORMAL HIGH (ref 0–1000)

## 2019-01-10 LAB — CBC WITH DIFFERENTIAL/PLATELET
Abs Immature Granulocytes: 0.04 10*3/uL (ref 0.00–0.07)
Basophils Absolute: 0 10*3/uL (ref 0.0–0.1)
Basophils Relative: 1 %
Eosinophils Absolute: 0.6 10*3/uL — ABNORMAL HIGH (ref 0.0–0.5)
Eosinophils Relative: 7 %
HCT: 44.4 % (ref 39.0–52.0)
Hemoglobin: 14.3 g/dL (ref 13.0–17.0)
Immature Granulocytes: 1 %
Lymphocytes Relative: 17 %
Lymphs Abs: 1.4 10*3/uL (ref 0.7–4.0)
MCH: 30.9 pg (ref 26.0–34.0)
MCHC: 32.2 g/dL (ref 30.0–36.0)
MCV: 95.9 fL (ref 80.0–100.0)
Monocytes Absolute: 0.9 10*3/uL (ref 0.1–1.0)
Monocytes Relative: 11 %
Neutro Abs: 5.6 10*3/uL (ref 1.7–7.7)
Neutrophils Relative %: 63 %
Platelets: 158 10*3/uL (ref 150–400)
RBC: 4.63 MIL/uL (ref 4.22–5.81)
RDW: 14.8 % (ref 11.5–15.5)
WBC: 8.7 10*3/uL (ref 4.0–10.5)
nRBC: 0.3 % — ABNORMAL HIGH (ref 0.0–0.2)

## 2019-01-10 LAB — GRAM STAIN

## 2019-01-10 MED ORDER — ATORVASTATIN CALCIUM 80 MG PO TABS
80.0000 mg | ORAL_TABLET | Freq: Every day | ORAL | Status: DC
  Administered 2019-01-10 – 2019-01-14 (×5): 80 mg via ORAL
  Filled 2019-01-10 (×5): qty 1

## 2019-01-10 MED ORDER — FUROSEMIDE 40 MG PO TABS
40.0000 mg | ORAL_TABLET | Freq: Every day | ORAL | Status: DC
  Administered 2019-01-10 – 2019-01-14 (×5): 40 mg via ORAL
  Filled 2019-01-10 (×5): qty 1

## 2019-01-10 MED ORDER — PANTOPRAZOLE SODIUM 40 MG PO TBEC
40.0000 mg | DELAYED_RELEASE_TABLET | Freq: Every day | ORAL | Status: DC
  Administered 2019-01-10 – 2019-01-14 (×5): 40 mg via ORAL
  Filled 2019-01-10 (×5): qty 1

## 2019-01-10 MED ORDER — APIXABAN 5 MG PO TABS
5.0000 mg | ORAL_TABLET | Freq: Two times a day (BID) | ORAL | Status: DC
  Administered 2019-01-10 – 2019-01-14 (×9): 5 mg via ORAL
  Filled 2019-01-10 (×9): qty 1

## 2019-01-10 MED ORDER — LEVOTHYROXINE SODIUM 50 MCG PO TABS
50.0000 ug | ORAL_TABLET | Freq: Every day | ORAL | Status: DC
  Administered 2019-01-10 – 2019-01-14 (×5): 50 ug via ORAL
  Filled 2019-01-10 (×5): qty 1

## 2019-01-10 MED ORDER — AMIODARONE HCL 200 MG PO TABS
200.0000 mg | ORAL_TABLET | Freq: Every day | ORAL | Status: DC
  Administered 2019-01-10 – 2019-01-13 (×4): 200 mg via ORAL
  Filled 2019-01-10 (×4): qty 1

## 2019-01-10 MED ORDER — TAMSULOSIN HCL 0.4 MG PO CAPS
0.4000 mg | ORAL_CAPSULE | Freq: Every day | ORAL | Status: DC
  Administered 2019-01-10 – 2019-01-13 (×4): 0.4 mg via ORAL
  Filled 2019-01-10 (×4): qty 1

## 2019-01-10 NOTE — Discharge Instructions (Signed)
Information on my medicine - ELIQUIS® (apixaban) ° °This medication education was reviewed with me or my healthcare representative as part of my discharge preparation.  °You were prescribed/taking this medication prior to this hospital admission. ° °Why was Eliquis® prescribed for you? °Eliquis® was prescribed for you to reduce the risk of a blood clot forming that can cause a stroke if you have a medical condition called atrial fibrillation (a type of irregular heartbeat). ° °What do You need to know about Eliquis® ? °Take your Eliquis® TWICE DAILY - one tablet in the morning and one tablet in the evening with or without food. If you have difficulty swallowing the tablet whole please discuss with your pharmacist how to take the medication safely. ° °Take Eliquis® exactly as prescribed by your doctor and DO NOT stop taking Eliquis® without talking to the doctor who prescribed the medication.  Stopping may increase your risk of developing a stroke.  Refill your prescription before you run out. ° °After discharge, you should have regular check-up appointments with your healthcare provider that is prescribing your Eliquis®.  In the future your dose may need to be changed if your kidney function or weight changes by a significant amount or as you get older. ° °What do you do if you miss a dose? °If you miss a dose, take it as soon as you remember on the same day and resume taking twice daily.  Do not take more than one dose of ELIQUIS at the same time to make up a missed dose. ° °Important Safety Information °A possible side effect of Eliquis® is bleeding. You should call your healthcare provider right away if you experience any of the following: °? Bleeding from an injury or your nose that does not stop. °? Unusual colored urine (red or dark brown) or unusual colored stools (red or black). °? Unusual bruising for unknown reasons. °? A serious fall or if you hit your head (even if there is no bleeding). ° °Some  medicines may interact with Eliquis® and might increase your risk of bleeding or clotting while on Eliquis®. To help avoid this, consult your healthcare provider or pharmacist prior to using any new prescription or non-prescription medications, including herbals, vitamins, non-steroidal anti-inflammatory drugs (NSAIDs) and supplements. ° °This website has more information on Eliquis® (apixaban): http://www.eliquis.com/eliquis/home °

## 2019-01-10 NOTE — Plan of Care (Signed)
POC initiated and progressing. 

## 2019-01-10 NOTE — Progress Notes (Signed)
PROGRESS NOTE  Eric MEMBRENO SWF:[id number] DOB: [birth year] DOA: 01/09/2019 PCP: Eric Gordon, Eric Gordon   LOS: 0 days   Brief narrative: Patient is an 85 year old Caucasian male with PMH significant for HTN, HLD, CAD s/p CABG, A. fib on Eliquis, CKD 3, OSA on CPAP, GERD, hypothyroidism.   He presented to the ED on 4/19 with sudden onset left-sided chest pain while at rest and progressing for several hours. In the ED, chest x-ray showed: 1. Moderate to large left pneumothorax but no mediastinal shift to suggest a component of tension. 2. Superimposed left pleural effusion and/or lung base consolidation. 3. No left rib fracture or other acute cardiopulmonary abnormality identified.  He underwent a left-sided chest tube placement by ED physician.  Critical care was consulted.  Patient was admitted under hospitalist service.  Subjective: Patient was seen and examined this morning.  Very pleasant elderly Caucasian male propped up in bed.  Mild pain in the chest tube insertion site especially on deep breathing.  Chest tube attached to suction.  Assessment/Plan:  Principal Problem:   Pneumothorax, left Active Problems:   Essential hypertension   Asthma, mild intermittent   Abdominal aortic aneurysm (HCC)   CAD (coronary artery disease)   Atrial fibrillation (HCC)   CKD (chronic kidney disease), stage III (HCC)   Chronic diastolic CHF (congestive heart failure) (HCC)   Hypothyroidism   Pleural effusion  Left hydropneumothorax/spontaneous left-sided pneumothorax - Chest tube in place.  Attached to suction.  Critical care managing. - No parenchymal disease identified in previous CT scan from February 2040 that would explain etiology of pneumothorax.  No history of trauma. - Chest x-ray also showed left pleural effusion.  Chest tube draining serous fluid. Suspect parapneumonic effusion from previous pneumonia. Pleural fluid studies sent.   -  Patient states he has had a pneumonia in the  past and whenever he had a pneumonia he had the pleural effusion that did not require thoracentesis or chest tube placement.  - Chest x-ray repeated this morning, slight improvement in pneumothorax. - CT scan of chest prior to discharge to evaluate for any parenchymal lung disease.  Cardiac issues : CAD s/p CABG in 2202 Chronic diastolic CHF Paroxysmal A. fib - Currently in sinus rhythm.  Not in acute heart failure.  - PTA, patient was on Coreg, statin, Lasix, nitrate, amiodarone, Eliquis.  Resume all.  CKD III  - SCr is 1.47 on admission, similar to priors  - Renally-dose medications   OSA - On CPAP at home. - Would avoid using CPAP at this time pneumothorax from getting worse.  Hypothyroidism - Continue Synthroid.  GERD - Continue Protonix.  Body mass index is 23.54 kg/m. Mobility: Encourage ambulation Diet: Cardiac diet DVT prophylaxis:  Eliquis Code Status:   Code Status: Full Code  Disposition Plan:  Currently has a chest tube in place.  Ultimate plan to discharge to home.  Consultants:  PCCM  Antimicrobials:  Anti-infectives (From admission, onward)   None      Infusions:  . sodium chloride      Scheduled Meds: . sodium chloride flush  3 mL Intravenous Q12H  . sodium chloride flush  3 mL Intravenous Q12H    PRN meds: sodium chloride, acetaminophen **OR** acetaminophen, morphine injection, ondansetron **OR** ondansetron (ZOFRAN) IV, oxyCODONE-acetaminophen, polyethylene glycol, sodium chloride flush   Objective: Vitals:   01/10/19 0044 01/10/19 0412  BP: 109/68 (!) 110/59  Pulse: (!) 59 (!) 53  Resp: 18 (!) 27  Temp: 98.3 F (36.8 C)  97.9 F (36.6 C)  SpO2: 92% 93%    Intake/Output Summary (Last 24 hours) at 01/10/2019 0928 Last data filed at 01/10/2019 0600 Gross per 24 hour  Intake 120 ml  Output 1425 ml  Net -1305 ml   Filed Weights   01/09/19 1544 01/09/19 2112 01/10/19 0500  Weight: 95.3 kg 91.8 kg 90 kg   Weight change:  Body  mass index is 23.54 kg/m.   Physical Exam: General exam: Appears calm and comfortable.  Skin: No rashes, lesions or ulcers. HEENT: None Lungs: Clear to auscultation on the right.  Diminished air entry on left because of poor ventilation due to pain. CVS: Regular rate and rhythm, no murmur GI/Abd soft, nontender, nondistended, bowel sound present CNS: Alert, awake, oriented x3 Psychiatry: Mood & affect appropriate.  Extremities: No pedal edema, no calf tenderness  Data Review: I have personally reviewed the laboratory data and studies available.  Recent Labs  Lab 01/09/19 1555 01/10/19 0438  WBC 8.3 8.7  NEUTROABS  --  5.6  HGB 15.0 14.3  HCT 46.5 44.4  MCV 97.3 95.9  PLT 170 158   Recent Labs  Lab 01/09/19 1555 01/10/19 0438  NA 138 138  K 3.9 4.0  CL 98 101  CO2 28 28  GLUCOSE 117* 99  BUN 19 18  CREATININE 1.47* 1.24  CALCIUM 9.4 9.0   Eric Gordon, Eric Gordon  [facility] 01/10/2019

## 2019-01-10 NOTE — Progress Notes (Signed)
NAME:  Eric Gordon, MRN:  [id number], DOB:  January 13, 1931, LOS: 0 ADMISSION DATE:  01/09/2019, CONSULTATION DATE:  01/09/2019 REFERRING MD:  Noemi Chapel, MD , CHIEF COMPLAINT:  Chest pain     History of present illness   Eric Gordon is an 85 year old gentleman admitted with pneumothorax.   Past Medical History   has a past medical history of Abdominal aortic aneurysm (AAA) ([place]), Arthritis, CAD (coronary artery disease), Chronic anticoagulation, Chronic lower back pain, GERD (gastroesophageal reflux disease), High cholesterol, Hypertension, Hypothyroidism, OSA on CPAP, Pneumonia, Pulmonary embolism ([place]), and S/P CABG (coronary artery bypass graft).  Consults:    Procedures:  Chest tube (left) on 4/19 >>  Significant Diagnostic Tests:  Chest x-ray-left hydropneumothorax  Micro Data:  Pleural culture: neg  Antimicrobials:  None  Objective   Blood pressure 119/71, pulse (!) 53, temperature 98.3 F (36.8 C), temperature source Oral, resp. rate (!) 24, height 6\' 5"  (1.956 m), weight 90 kg, SpO2 94 %.        Intake/Output Summary (Last 24 hours) at 01/10/2019 1715 Last data filed at 01/10/2019 1627 Gross per 24 hour  Intake 120 ml  Output 2025 ml  Net -1905 ml   Filed Weights   01/09/19 1544 01/09/19 2112 01/10/19 0500  Weight: 95.3 kg 91.8 kg 90 kg    Examination:  General: Elderly male in NAD HENT: [place]/AT, no JVD Lungs: Clear bilateral breath sounds Cardiovascular: RRR, no MRG Abdomen: Soft nontender no organomegaly Extremities: No lower extremity edema, no deformities Neuro: Alert, oriented   Assessment & Plan:   Spontaneous Left Hydropneumothorax: .  Also has a pleural effusion on imaging.  He had a CT scan done in February of this year and at that time his lung parenchyma appeared normal.  He has a small cyst/bullae on his right lung parenchyma.  He also had a left lobe consolidation and a small effusion at that time.  No other parenchymal disease that  would explain etiology of spontaneous pneumothorax.  He has not had any trauma.  He has had a chest tube placed in the ED with drainage of serous fluid.  We are consulted for chest tube management.   Pleural fluid is exudative by LDH, which is consistent with parapneumonic effusion. Cultures negative. WBC 5100.  PTX remains on CXR 4/20  Plan    - Reduce chest tube to 10 cmH2O of suction - Repeat CXR in AM, if resolved can transition to waterseal.  - Will likely need a repeat CT prior to discharge to evaluate for any parenchymal lung abnormalities. - Will follow 4/21   Labs   CBC: Recent Labs  Lab 01/09/19 1555 01/10/19 0438  WBC 8.3 8.7  NEUTROABS  --  5.6  HGB 15.0 14.3  HCT 46.5 44.4  MCV 97.3 95.9  PLT 170 322    Basic Metabolic Panel: Recent Labs  Lab 01/09/19 1555 01/10/19 0438  NA 138 138  K 3.9 4.0  CL 98 101  CO2 28 28  GLUCOSE 117* 99  BUN 19 18  CREATININE 1.47* 1.24  CALCIUM 9.4 9.0   GFR: Estimated Creatinine Clearance: 54.9 mL/min (by C-G formula based on SCr of 1.24 mg/dL). Recent Labs  Lab 01/09/19 1555 01/10/19 0438  WBC 8.3 8.7   ABG    Component Value Date/Time   PHART 7.417 11/14/2013 0820   PCO2ART 46.1 (H) 11/14/2013 0820   PO2ART 50.0 (L) 11/14/2013 0820   HCO3 25.6 10/21/2018 0531   TCO2 27 10/21/2018  0531   ACIDBASEDEF 1.0 12/11/2010 1902   O2SAT 53.0 10/21/2018 0531     Coagulation Profile: No results for input(s): INR, PROTIME in the last 168 hours.  Cardiac Enzymes: Recent Labs  Lab 01/09/19 1555  TROPONINI <0.03    HbA1C: Hgb A1c MFr Bld  Date/Time Value Ref Range Status  12/06/2010 06:25 AM (H) <5.7 % Final   5.8 (NOTE)                                                                       According to the ADA Clinical Practice Recommendations for 2011, when HbA1c is used as a screening test:   >=6.5%   Diagnostic of Diabetes Mellitus           (if abnormal result  is confirmed)  5.7-6.4%   Increased risk of  developing Diabetes Mellitus  References:Diagnosis and Classification of Diabetes Mellitus,Diabetes GNOI,3704,88(QBVQX 1):S62-S69 and Standards of Medical Care in         Diabetes - 2011,Diabetes Care,2011,34  (Suppl 1):S11-S61.     Georgann Housekeeper, AGACNP-BC [place] Pager [phone] or [phone]  01/10/2019 5:24 PM

## 2019-01-11 ENCOUNTER — Inpatient Hospital Stay (HOSPITAL_COMMUNITY): Payer: Medicare Other

## 2019-01-11 NOTE — Progress Notes (Addendum)
Changed chest tube dressing. Made adjustment with [place] to improve catheter flow. Pt resting with call bell within reach.  Will continue to monitor.

## 2019-01-11 NOTE — Progress Notes (Signed)
PROGRESS NOTE  Eric Gordon BWL:[id number] DOB: 18-Jun-1931 DOA: 01/09/2019 PCP: Shon Baton, MD   LOS: 1 day   Brief narrative: Patient is an 85 year old Caucasian male with PMH significant for HTN, HLD, CAD s/p CABG, A. fib on Eliquis, CKD 3, OSA on CPAP, GERD, hypothyroidism.   He presented to the ED on 4/19 with sudden onset left-sided chest pain while at rest and progressing for several hours. In the ED, chest x-ray showed: 1. Moderate to large left pneumothorax but no mediastinal shift to suggest a component of tension. 2. Superimposed left pleural effusion and/or lung base consolidation. 3. No left rib fracture or other acute cardiopulmonary abnormality identified.  He underwent a left-sided chest tube placement by ED physician.  Critical care was consulted.  Patient was admitted under hospitalist service for spontaneous left hydropneumothorax.  Subjective: Patient was seen and examined this morning.  Does not feel any symptoms.  Repeat chest x-ray from this morning noted.  Showed enlarged pneumothorax.  Assessment/Plan:  Principal Problem:   Pneumothorax, left Active Problems:   Essential hypertension   Asthma, mild intermittent   Abdominal aortic aneurysm (HCC)   CAD (coronary artery disease)   Atrial fibrillation (HCC)   CKD (chronic kidney disease), stage III (HCC)   Chronic diastolic CHF (congestive heart failure) (HCC)   Hypothyroidism   Pleural effusion   Pneumothorax on left  Spontaneous left-sided pneumothorax - Chest tube in place.  Attached to suction.  Critical care managing.  - Repeat chest x-ray from this morning noted. Showed enlarged pneumothorax.  -Critical care turned up the CT suction back.  Plan is to repeat chest x-ray tomorrow again. -  Unclear etiology of pneumothorax. No parenchymal disease identified in previous CT scan from February 2020.  No history of trauma.  -  Patient may benefit from CT scan of chest prior to discharge to  evaluate for any parenchymal lung disease.  Left pleural effusion - Chest x-ray on admission also showed left pleural effusion.  Chest tube draining serous fluid. Suspect parapneumonic effusion from previous pneumonia. Pleural fluid studies showed exudative fluid with eosinophilic predominance.  - Patient states he has had a pneumonia in the past and whenever he had a pneumonia he had the pleural effusion that did not require thoracentesis or chest tube placement.   Cardiac issues : CADs/p CABG in 6811 Chronic diastolic CHF Paroxysmal A. fib - Currently in sinus rhythm.  Not in acute heart failure.  - PTA, patient was on Coreg, statin, Lasix, nitrate, amiodarone, Eliquis. Continue all.  CKD III -SCr is 1.47 on admission, similar to priors -Renally-dose medications  OSA - On CPAP at home. - Would avoid using CPAP at this time pneumothorax from getting worse.  Hypothyroidism - Continue Synthroid.  GERD - Continue Protonix.  Mobility: Encourage ambulation Diet: Cardiac diet DVT prophylaxis: Eliquis Code Status:  Code Status: Full Code  Disposition Plan: Currently has a chest tube in place.  Ultimate plan to discharge to home.  Consultants:  PCCM  Antimicrobials:  Anti-infectives (From admission, onward)   None      Infusions:  . sodium chloride      Scheduled Meds: . amiodarone  200 mg Oral Daily  . apixaban  5 mg Oral BID  . atorvastatin  80 mg Oral Daily  . furosemide  40 mg Oral Daily  . levothyroxine  50 mcg Oral Daily  . pantoprazole  40 mg Oral Daily  . sodium chloride flush  3 mL Intravenous Q12H  .  sodium chloride flush  3 mL Intravenous Q12H  . tamsulosin  0.4 mg Oral QPC supper    PRN meds: sodium chloride, acetaminophen **OR** acetaminophen, morphine injection, ondansetron **OR** ondansetron (ZOFRAN) IV, oxyCODONE-acetaminophen, polyethylene glycol, sodium chloride flush   Objective: Vitals:   01/11/19 0007 01/11/19 0400  BP:  (!) 115/55 (!) 94/54  Pulse: 61 61  Resp: 18 20  Temp: 98.2 F (36.8 C) 97.6 F (36.4 C)  SpO2: 91% 90%    Intake/Output Summary (Last 24 hours) at 01/11/2019 1404 Last data filed at 01/11/2019 1355 Gross per 24 hour  Intake -  Output 1390 ml  Net -1390 ml   Filed Weights   01/09/19 2112 01/10/19 0500 01/11/19 0400  Weight: 91.8 kg 90 kg 92.1 kg   Weight change: -3.155 kg Body mass index is 24.08 kg/m.   Physical Exam: General exam: Appears calm and comfortable.  Skin: No rashes, lesions or ulcers. HEENT: None Lungs: Left sided chest tube attached to suction.  Clear to auscultation CVS: Regular rate and rhythm, normal GI/Abd soft, nontender, nondistended, bowel sound present CNS: Alert, awake, oriented x3 Psychiatry: Mood & affect appropriate.  Extremities: No pedal edema, no calf tenderness  Data Review: I have personally reviewed the laboratory data and studies available.  Recent Labs  Lab 01/09/19 1555 01/10/19 0438  WBC 8.3 8.7  NEUTROABS  --  5.6  HGB 15.0 14.3  HCT 46.5 44.4  MCV 97.3 95.9  PLT 170 158   Recent Labs  Lab 01/09/19 1555 01/10/19 0438  NA 138 138  K 3.9 4.0  CL 98 101  CO2 28 28  GLUCOSE 117* 99  BUN 19 18  CREATININE 1.47* 1.24  CALCIUM 9.4 9.0    Terrilee Croak, MD  [facility] 01/11/2019

## 2019-01-11 NOTE — Progress Notes (Addendum)
Flushed pigtail catheter with 30 ml normal saline flush. Pt resting with call bell within reach.  Will continue to monitor.Payton Emerald, RN

## 2019-01-11 NOTE — Progress Notes (Signed)
NAME:  Eric Gordon, MRN:  [id number], DOB:  [birth year], LOS: 1 ADMISSION DATE:  01/09/2019, CONSULTATION DATE:  01/09/2019 REFERRING MD:  Noemi Chapel, MD , CHIEF COMPLAINT:  Chest pain     History of present illness   Eric Gordon is an 85 year old gentleman admitted with pneumothorax.   Past Medical History   has a past medical history of Abdominal aortic aneurysm (AAA) ([place]), Arthritis, CAD (coronary artery disease), Chronic anticoagulation, Chronic lower back pain, GERD (gastroesophageal reflux disease), High cholesterol, Hypertension, Hypothyroidism, OSA on CPAP, Pneumonia, Pulmonary embolism ([place]), and S/P CABG (coronary artery bypass graft).  Consults:    Procedures:  Chest tube (left) on 4/19 >   Significant Diagnostic Tests:  Chest x-ray-left hydropneumothorax  Micro Data:  Pleural culture: neg  Antimicrobials:  None  Objective   Blood pressure (!) 94/54, pulse 61, temperature 97.6 F (36.4 C), temperature source Oral, resp. rate 20, height 6\' 5"  (1.956 m), weight 92.1 kg, SpO2 90 %.        Intake/Output Summary (Last 24 hours) at 01/11/2019 1107 Last data filed at 01/11/2019 0441 Gross per 24 hour  Intake -  Output 890 ml  Net -890 ml   Filed Weights   01/09/19 2112 01/10/19 0500 01/11/19 0400  Weight: 91.8 kg 90 kg 92.1 kg    Examination:   General: Elderly male in no distress seated on bedside HENT: [place]/AT, no JVD Lungs: Clear Cardiovascular: RRR, no MRG Abdomen: Soft, non-tender, non-distended Extremities: No lower extremity edema, no deformities Neuro: Alert, oriented, non-focal  Assessment & Plan:   Spontaneous Left Hydropneumothorax: Normal chest Xray at the end of Jan, but early feb had effusion. Was treated as pneumonia. Now presented with L sided pneumothorax. Chest tube placed in ED and PTX mostly improved with 20cm H2O suction, decreased to 10, but now PTX larger.   Pleural fluid sent. Exudate with eosinophil predominance. Etiology  unclear.    Eosinophilic predominance may reflect drug reaction.    Plan    - Turn CT suction back to 20cm H2O - Repeat CXR in AM - Will likely need a repeat CT prior to discharge to evaluate for any parenchymal abnormalities. - May need to discuss need to continue new medications with cardiology. Amiodarone primarily.    Labs   CBC: Recent Labs  Lab 01/09/19 1555 01/10/19 0438  WBC 8.3 8.7  NEUTROABS  --  5.6  HGB 15.0 14.3  HCT 46.5 44.4  MCV 97.3 95.9  PLT 170 751    Basic Metabolic Panel: Recent Labs  Lab 01/09/19 1555 01/10/19 0438  NA 138 138  K 3.9 4.0  CL 98 101  CO2 28 28  GLUCOSE 117* 99  BUN 19 18  CREATININE 1.47* 1.24  CALCIUM 9.4 9.0   GFR: Estimated Creatinine Clearance: 54.9 mL/min (by C-G formula based on SCr of 1.24 mg/dL). Recent Labs  Lab 01/09/19 1555 01/10/19 0438  WBC 8.3 8.7   ABG    Component Value Date/Time   PHART 7.417 11/14/2013 0820   PCO2ART 46.1 (H) 11/14/2013 0820   PO2ART 50.0 (L) 11/14/2013 0820   HCO3 25.6 10/21/2018 0531   TCO2 27 10/21/2018 0531   ACIDBASEDEF 1.0 12/11/2010 1902   O2SAT 53.0 10/21/2018 0531     Coagulation Profile: No results for input(s): INR, PROTIME in the last 168 hours.  Cardiac Enzymes: Recent Labs  Lab 01/09/19 1555  TROPONINI <0.03    HbA1C: Hgb A1c MFr Bld  Date/Time Value Ref Range Status  12/06/2010 06:25 AM (H) <5.7 % Final   5.8 (NOTE)                                                                       According to the ADA Clinical Practice Recommendations for 2011, when HbA1c is used as a screening test:   >=6.5%   Diagnostic of Diabetes Mellitus           (if abnormal result  is confirmed)  5.7-6.4%   Increased risk of developing Diabetes Mellitus  References:Diagnosis and Classification of Diabetes Mellitus,Diabetes CVUD,3143,88(ILNZV 1):S62-S69 and Standards of Medical Care in         Diabetes - 2011,Diabetes JKQA,0601,56  (Suppl 1):S11-S61.     Georgann Housekeeper,  AGACNP-BC [place] Pager [phone] or [phone]  01/11/2019 11:07 AM

## 2019-01-12 ENCOUNTER — Inpatient Hospital Stay (HOSPITAL_COMMUNITY): Payer: Medicare Other

## 2019-01-12 ENCOUNTER — Other Ambulatory Visit (HOSPITAL_COMMUNITY): Payer: Medicare Other

## 2019-01-12 MED ORDER — IPRATROPIUM-ALBUTEROL 0.5-2.5 (3) MG/3ML IN SOLN
3.0000 mL | Freq: Four times a day (QID) | RESPIRATORY_TRACT | Status: DC
  Administered 2019-01-12 (×2): 3 mL via RESPIRATORY_TRACT
  Filled 2019-01-12 (×2): qty 3

## 2019-01-12 MED ORDER — IPRATROPIUM-ALBUTEROL 0.5-2.5 (3) MG/3ML IN SOLN
3.0000 mL | Freq: Two times a day (BID) | RESPIRATORY_TRACT | Status: DC
  Administered 2019-01-13 – 2019-01-14 (×3): 3 mL via RESPIRATORY_TRACT
  Filled 2019-01-12 (×3): qty 3

## 2019-01-12 NOTE — Progress Notes (Signed)
Pigtail catheter flushed with 30 cc normal saline per order. Pt tolerated well. Will continue to monitor.  Amanda Cockayne, RN

## 2019-01-12 NOTE — Progress Notes (Signed)
NAME:  Eric Gordon, MRN:  [id number], DOB:  [birth year], LOS: 2 ADMISSION DATE:  01/09/2019, CONSULTATION DATE:  01/09/2019 REFERRING MD:  Noemi Chapel, MD , CHIEF COMPLAINT:  Chest pain     History of present illness   Eric Gordon is an 85 year old gentleman with history of CABG, CHF and atrial fibrillation recently started on amiodarone admitted with pneumothorax.   Past Medical History   has a past medical history of Abdominal aortic aneurysm (AAA) ([place]), Arthritis, CAD (coronary artery disease), Chronic anticoagulation, Chronic lower back pain, GERD (gastroesophageal reflux disease), High cholesterol, Hypertension, Hypothyroidism, OSA on CPAP, Pneumonia, Pulmonary embolism ([place]), and S/P CABG (coronary artery bypass graft).  Consults:    Procedures:  Chest tube (left) on 4/19 >   Significant Diagnostic Tests:  Chest x-ray-left hydropneumothorax  Micro Data:  Pleural culture: neg  Antimicrobials:  None Interval Events/Last 24 hours   Remained on suction. CXR with interval resolution of PTX. Reports chest wall pain.  Objective   Blood pressure 106/64, pulse 68, temperature 98.1 F (36.7 C), temperature source Oral, resp. rate (!) 26, height 6\' 5"  (1.956 m), weight 92 kg, SpO2 90 %.        Intake/Output Summary (Last 24 hours) at 01/12/2019 1345 Last data filed at 01/12/2019 0945 Gross per 24 hour  Intake 550 ml  Output 1120 ml  Net -570 ml   Filed Weights   01/10/19 0500 01/11/19 0400 01/12/19 0455  Weight: 90 kg 92.1 kg 92 kg   Physical Exam: General: Well-appearing, elderly no acute distress HENT: [place], AT, OP clear, MMM Eyes: EOMI, no scleral icterus Respiratory: Clear to auscultation bilaterally.  No crackles, wheezing or rales. Left chest tube on suction. No air leak Cardiovascular: RRR, -M/R/G, no JVD Extremities:-Edema,-tenderness Neuro: AAO x4, CNII-XII grossly intact Skin: Intact, no rashes or bruising Psych: Normal mood, normal affect   Assessment & Plan:  85 year old male with 40 pack year history who is found with spontaneous left hydropneumothorax and eosinophilic left pleural effusion of unknown etiology. Initially had left effusion seen in February that was treated with antibiotics for presumed pneumonia. This admission, presents with left-sided PTX.  Spontaneous Left Hydropneumothorax Chest tube placed on 4/19. Reduced suction with recurrence of PTX so suction was returned to -20cmH20 in the last 24hours. CXR 4/22 reviewed personally by me with near resolution of PTX -Continue chest tube on -20cm H2O -Start scheduled bronchodilators -CXR tomorrow -Will likely need a repeat CT prior to discharge to evaluate for any parenchymal abnormalities.  Eosinophilic pleural effusion secondary to pneumothorax Pleural fluid returned as exudate with eosinophil predominance. Etiology unclear though this may reflect irritation secondary to pneumothorax, recent infection or drug reaction. Could consider amiodarone as a potential drug culprit as patient recently started on in Feb 2020 however documented adverse effects for eosinophilic pneumonia/effusion are rare. -Consider discussion with Cardiology for alternatives to amiodarone   Labs   CBC: Recent Labs  Lab 01/09/19 1555 01/10/19 0438  WBC 8.3 8.7  NEUTROABS  --  5.6  HGB 15.0 14.3  HCT 46.5 44.4  MCV 97.3 95.9  PLT 170 093    Basic Metabolic Panel: Recent Labs  Lab 01/09/19 1555 01/10/19 0438  NA 138 138  K 3.9 4.0  CL 98 101  CO2 28 28  GLUCOSE 117* 99  BUN 19 18  CREATININE 1.47* 1.24  CALCIUM 9.4 9.0   GFR: Estimated Creatinine Clearance: 54.9 mL/min (by C-G formula based on SCr of 1.24 mg/dL). Recent  Labs  Lab 01/09/19 1555 01/10/19 0438  WBC 8.3 8.7   ABG    Component Value Date/Time   PHART 7.417 11/14/2013 0820   PCO2ART 46.1 (H) 11/14/2013 0820   PO2ART 50.0 (L) 11/14/2013 0820   HCO3 25.6 10/21/2018 0531   TCO2 27 10/21/2018 0531    ACIDBASEDEF 1.0 12/11/2010 1902   O2SAT 53.0 10/21/2018 0531     Coagulation Profile: No results for input(s): INR, PROTIME in the last 168 hours.  Cardiac Enzymes: Recent Labs  Lab 01/09/19 1555  TROPONINI <0.03    Rodman Pickle, M.D. [facility] Pulmonary/Critical Care Medicine Pager: [phone] After hours pager: [phone]

## 2019-01-12 NOTE — Progress Notes (Signed)
Pigtail cathter flushed with 30 cc normal saline as ordered. Pt tolerated well. Chest tube output was 320 since 1800. Will continue to monitor.

## 2019-01-12 NOTE — Progress Notes (Signed)
[place] Drainage changed, pt tolerated well. NO air leak noted. -20 suction applied as ordered. Call bell within reach. Will continue to monitor.

## 2019-01-12 NOTE — Progress Notes (Signed)
Patient ID: Eric Gordon, male   DOB: [birth year], 85 y.o.   MRN: [id number]  PROGRESS NOTE    HAYDON DORRIS  [id number]:[id number] DOB: 10-24-1930 DOA: 01/09/2019 PCP: Shon Baton, MD   Brief Narrative:  85 year old male with history of hypertension, hyperlipidemia, CAD status post CABG, A. fib on Eliquis, chronic kidney disease stage III, OSA on CPAP, GERD, hypothyroidism presented on 01/09/2019 with sudden onset left-sided chest pain.  He was found to have spontaneous left hydropneumothorax for which chest tube was placed by ED provider.  PCCM was consulted.  Assessment & Plan:   Principal Problem:   Pneumothorax, left Active Problems:   Essential hypertension   Asthma, mild intermittent   Abdominal aortic aneurysm (HCC)   CAD (coronary artery disease)   Atrial fibrillation (HCC)   CKD (chronic kidney disease), stage III (HCC)   Chronic diastolic CHF (congestive heart failure) (HCC)   Hypothyroidism   Pleural effusion   Pneumothorax on left  Spontaneous left hydropneumothorax with left pleural effusion -Chest tube in place.  PCCM following and managing chest tube. -Pleural fluid was exudative with eosinophilic predominance.  Etiology unclear.  Cytology negative  CAD status post CABG Chronic diastolic CHF Paroxysmal A. fib -Rate controlled.  Currently compensated and not in acute heart failure.  Continue  statin, Lasix,  amiodarone and Eliquis.  Outpatient follow-up with cardiology  Chronic kidney disease stage III -Creatinine stable on admission.  Monitor  OSA -On CPAP at home.  Would avoid using CPAP at this time as pneumothorax might get worse  Hypothyroidism -Continue Synthroid  GERD Continue Protonix   DVT prophylaxis: Eliquis Code Status: Full Family Communication: None at bedside  disposition Plan: Home once chest tube is removed and once cleared by PCCM  Consultants: PCCM  Procedures: Chest tube placed on admission by ED provider  Antimicrobials:  None   Subjective: Patient seen and examined at bedside.  Complains of some chest pain around the chest tube site.  No overnight fever, nausea, vomiting or worsening shortness of breath.  Objective: Vitals:   01/11/19 2125 01/11/19 2327 01/12/19 0455 01/12/19 0743  BP: 125/63 123/61 113/70 130/69  Pulse: 60 63 63 67  Resp: 20 20 20  (!) 23  Temp: 98.3 F (36.8 C) 97.9 F (36.6 C) 97.7 F (36.5 C) 97.6 F (36.4 C)  TempSrc: Oral Oral Oral Oral  SpO2: 93% 91% 95% 93%  Weight:   92 kg   Height:        Intake/Output Summary (Last 24 hours) at 01/12/2019 0928 Last data filed at 01/12/2019 0540 Gross per 24 hour  Intake 150 ml  Output 1370 ml  Net -1220 ml   Filed Weights   01/10/19 0500 01/11/19 0400 01/12/19 0455  Weight: 90 kg 92.1 kg 92 kg    Examination:  General exam: Appears calm and comfortable  Respiratory system: Bilateral decreased breath sounds at bases with scattered crackles.  Left-sided chest tube present. Cardiovascular system: S1 & S2 heard, Rate controlled Gastrointestinal system: Abdomen is nondistended, soft and nontender. Normal bowel sounds heard. Extremities: No cyanosis, clubbing, edema    Data Reviewed: I have personally reviewed following labs and imaging studies  CBC: Recent Labs  Lab 01/09/19 1555 01/10/19 0438  WBC 8.3 8.7  NEUTROABS  --  5.6  HGB 15.0 14.3  HCT 46.5 44.4  MCV 97.3 95.9  PLT 170 798   Basic Metabolic Panel: Recent Labs  Lab 01/09/19 1555 01/10/19 0438  NA 138 138  K 3.9 4.0  CL 98 101  CO2 28 28  GLUCOSE 117* 99  BUN 19 18  CREATININE 1.47* 1.24  CALCIUM 9.4 9.0   GFR: Estimated Creatinine Clearance: 54.9 mL/min (by C-G formula based on SCr of 1.24 mg/dL). Liver Function Tests: Recent Labs  Lab 01/10/19 0438  AST 15  ALT 12  ALKPHOS 89  BILITOT 1.2  PROT 6.2*  ALBUMIN 3.4*   No results for input(s): LIPASE, AMYLASE in the last 168 hours. No results for input(s): AMMONIA in the last 168 hours.  Coagulation Profile: No results for input(s): INR, PROTIME in the last 168 hours. Cardiac Enzymes: Recent Labs  Lab 01/09/19 1555  TROPONINI <0.03   BNP (last 3 results) No results for input(s): PROBNP in the last 8760 hours. HbA1C: No results for input(s): HGBA1C in the last 72 hours. CBG: No results for input(s): GLUCAP in the last 168 hours. Lipid Profile: No results for input(s): CHOL, HDL, LDLCALC, TRIG, CHOLHDL, LDLDIRECT in the last 72 hours. Thyroid Function Tests: No results for input(s): TSH, T4TOTAL, FREET4, T3FREE, THYROIDAB in the last 72 hours. Anemia Panel: No results for input(s): VITAMINB12, FOLATE, FERRITIN, TIBC, IRON, RETICCTPCT in the last 72 hours. Sepsis Labs: No results for input(s): PROCALCITON, LATICACIDVEN in the last 168 hours.  Recent Results (from the past 240 hour(s))  Gram stain     Status: None   Collection Time: 01/09/19  8:49 PM  Result Value Ref Range Status   Specimen Description PLEURAL FLUID  Final   Special Requests NONE  Final   Gram Stain   Final    WBC PRESENT,BOTH PMN AND MONONUCLEAR NO ORGANISMS SEEN Performed at [facility], [address] [place], [place] [address]    Report Status 01/10/2019 FINAL  Final  Culture, body fluid-bottle     Status: None (Preliminary result)   Collection Time: 01/09/19  8:49 PM  Result Value Ref Range Status   Specimen Description PLEURAL FLUID  Final   Special Requests   Final    BOTTLES DRAWN AEROBIC ONLY Blood Culture adequate volume   Culture   Final    NO GROWTH 2 DAYS Performed at [facility], [address], [place], [place] [address]    Report Status PENDING  Incomplete         Radiology Studies: Dg Chest Port 1 View  Result Date: 01/11/2019 CLINICAL DATA:  Pneumothorax. EXAM: PORTABLE CHEST 1 VIEW COMPARISON:  Radiograph of January 10, 2019. FINDINGS: Stable cardiomediastinal silhouette. Atherosclerosis of thoracic aorta is noted. Status post coronary bypass graft.  Right lung is clear. Stable position of left-sided chest tube. Moderate size left pneumothorax is noted which is significantly increased in size compared to prior exam. Bony thorax is unremarkable. IMPRESSION: Left-sided pneumothorax is significantly enlarged compared to prior exam. Left-sided chest tube is unchanged in position. Aortic Atherosclerosis (ICD10-I70.0). Electronically Signed   By: Marijo Conception M.D.   On: 01/11/2019 08:12        Scheduled Meds: . amiodarone  200 mg Oral Daily  . apixaban  5 mg Oral BID  . atorvastatin  80 mg Oral Daily  . furosemide  40 mg Oral Daily  . levothyroxine  50 mcg Oral Daily  . pantoprazole  40 mg Oral Daily  . sodium chloride flush  3 mL Intravenous Q12H  . sodium chloride flush  3 mL Intravenous Q12H  . tamsulosin  0.4 mg Oral QPC supper   Continuous Infusions: . sodium chloride  LOS: 2 days        Aline August, MD [facility] 01/12/2019, 9:28 AM

## 2019-01-13 ENCOUNTER — Inpatient Hospital Stay (HOSPITAL_COMMUNITY): Payer: Medicare Other

## 2019-01-13 LAB — BASIC METABOLIC PANEL
Anion gap: 11 (ref 5–15)
BUN: 23 mg/dL (ref 8–23)
CO2: 29 mmol/L (ref 22–32)
Calcium: 8.8 mg/dL — ABNORMAL LOW (ref 8.9–10.3)
Chloride: 98 mmol/L (ref 98–111)
Creatinine, Ser: 1.36 mg/dL — ABNORMAL HIGH (ref 0.61–1.24)
GFR calc Af Amer: 55 mL/min — ABNORMAL LOW (ref >60)
GFR calc non Af Amer: 47 mL/min — ABNORMAL LOW (ref >60)
Glucose, Bld: 108 mg/dL — ABNORMAL HIGH (ref 70–99)
Potassium: 3.9 mmol/L (ref 3.5–5.1)
Sodium: 138 mmol/L (ref 135–145)

## 2019-01-13 LAB — CBC WITH DIFFERENTIAL/PLATELET
Abs Immature Granulocytes: 0.03 10*3/uL (ref 0.00–0.07)
Basophils Absolute: 0 10*3/uL (ref 0.0–0.1)
Basophils Relative: 0 %
Eosinophils Absolute: 0.8 10*3/uL — ABNORMAL HIGH (ref 0.0–0.5)
Eosinophils Relative: 10 %
HCT: 45.5 % (ref 39.0–52.0)
Hemoglobin: 14.7 g/dL (ref 13.0–17.0)
Immature Granulocytes: 0 %
Lymphocytes Relative: 17 %
Lymphs Abs: 1.4 10*3/uL (ref 0.7–4.0)
MCH: 30.8 pg (ref 26.0–34.0)
MCHC: 32.3 g/dL (ref 30.0–36.0)
MCV: 95.4 fL (ref 80.0–100.0)
Monocytes Absolute: 0.9 10*3/uL (ref 0.1–1.0)
Monocytes Relative: 11 %
Neutro Abs: 5.1 10*3/uL (ref 1.7–7.7)
Neutrophils Relative %: 62 %
Platelets: 162 10*3/uL (ref 150–400)
RBC: 4.77 MIL/uL (ref 4.22–5.81)
RDW: 14.3 % (ref 11.5–15.5)
WBC: 8.3 10*3/uL (ref 4.0–10.5)
nRBC: 0 % (ref 0.0–0.2)

## 2019-01-13 LAB — MAGNESIUM: Magnesium: 1.8 mg/dL (ref 1.7–2.4)

## 2019-01-13 NOTE — Progress Notes (Signed)
Patient ID: Eric Gordon, male   DOB: May 31, 1931, 85 y.o.   MRN: [id number]  PROGRESS NOTE    Eric Gordon  [id number]:[id number] DOB: [birth year] DOA: 01/09/2019 PCP: Shon Baton, MD   Brief Narrative:  85 year old male with history of hypertension, hyperlipidemia, CAD status post CABG, A. fib on Eliquis, chronic kidney disease stage III, OSA on CPAP, GERD, hypothyroidism presented on 01/09/2019 with sudden onset left-sided chest pain.  He was found to have spontaneous left hydropneumothorax for which chest tube was placed by ED provider.  PCCM was consulted.  Assessment & Plan:   Principal Problem:   Pneumothorax, left Active Problems:   Essential hypertension   Asthma, mild intermittent   Abdominal aortic aneurysm (HCC)   CAD (coronary artery disease)   Atrial fibrillation (HCC)   CKD (chronic kidney disease), stage III (HCC)   Chronic diastolic CHF (congestive heart failure) (HCC)   Hypothyroidism   Pleural effusion   Pneumothorax on left  Spontaneous left hydropneumothorax with left pleural effusion -Chest tube in place.  PCCM following and managing chest tube. -Pleural fluid was exudative with eosinophilic predominance.  Etiology unclear.  Cytology negative.  PCCM concerned about use of amiodarone and probable lung toxicity.  CAD status post CABG Chronic diastolic CHF Paroxysmal A. fib -Rate controlled.  Currently compensated and not in acute heart failure.  Continue  statin, Lasix and Eliquis.  Got in touch with Dr. Aundra Dubin who follows the patient as an outpatient and he is okay for amiodarone to be discontinued. -Outpatient follow-up with cardiology.  Chronic kidney disease stage III -Creatinine stable on admission.  Monitor  OSA -On CPAP at home.  Would avoid using CPAP at this time as pneumothorax might get worse  Hypothyroidism -Continue Synthroid  GERD Continue Protonix   DVT prophylaxis: Eliquis Code Status: Full Family Communication: None at bedside   disposition Plan: Home once chest tube is removed and once cleared by PCCM  Consultants: PCCM  Procedures: Chest tube placed on admission by ED provider  Antimicrobials: None   Subjective: Patient seen and examined at bedside.  No worsening shortness of breath or fever.  Complains of some chest pain around the chest tube site.  Objective: Vitals:   01/12/19 2307 01/13/19 0500 01/13/19 0850 01/13/19 0855  BP: 117/68  129/72   Pulse: 62  70   Resp: 18  18   Temp: 97.6 F (36.4 C)  98.1 F (36.7 C)   TempSrc: Oral  Oral   SpO2: 91%  100% 100%  Weight:  92.6 kg    Height:  6\' 5"  (1.956 m)      Intake/Output Summary (Last 24 hours) at 01/13/2019 0957 Last data filed at 01/13/2019 0854 Gross per 24 hour  Intake 610 ml  Output 102 ml  Net 508 ml   Filed Weights   01/11/19 0400 01/12/19 0455 01/13/19 0500  Weight: 92.1 kg 92 kg 92.6 kg    Examination:  General exam: Appears calm and comfortable.  No distress Respiratory system: Bilateral decreased breath sounds at bases with scattered crackles.  No wheezing.  Left-sided chest tube present. Cardiovascular system: Rate controlled, S1-S2 heard  gastrointestinal system: Abdomen is nondistended, soft and nontender. Normal bowel sounds heard. Extremities: No cyanosis, edema    Data Reviewed: I have personally reviewed following labs and imaging studies  CBC: Recent Labs  Lab 01/09/19 1555 01/10/19 0438 01/13/19 0308  WBC 8.3 8.7 8.3  NEUTROABS  --  5.6 5.1  HGB 15.0 14.3 14.7  HCT 46.5 44.4 45.5  MCV 97.3 95.9 95.4  PLT 170 158 235   Basic Metabolic Panel: Recent Labs  Lab 01/09/19 1555 01/10/19 0438 01/13/19 0308  NA 138 138 138  K 3.9 4.0 3.9  CL 98 101 98  CO2 28 28 29   GLUCOSE 117* 99 108*  BUN 19 18 23   CREATININE 1.47* 1.24 1.36*  CALCIUM 9.4 9.0 8.8*  MG  --   --  1.8   GFR: Estimated Creatinine Clearance: 50 mL/min (A) (by C-G formula based on SCr of 1.36 mg/dL (H)). Liver Function Tests:  Recent Labs  Lab 01/10/19 0438  AST 15  ALT 12  ALKPHOS 89  BILITOT 1.2  PROT 6.2*  ALBUMIN 3.4*   No results for input(s): LIPASE, AMYLASE in the last 168 hours. No results for input(s): AMMONIA in the last 168 hours. Coagulation Profile: No results for input(s): INR, PROTIME in the last 168 hours. Cardiac Enzymes: Recent Labs  Lab 01/09/19 1555  TROPONINI <0.03   BNP (last 3 results) No results for input(s): PROBNP in the last 8760 hours. HbA1C: No results for input(s): HGBA1C in the last 72 hours. CBG: No results for input(s): GLUCAP in the last 168 hours. Lipid Profile: No results for input(s): CHOL, HDL, LDLCALC, TRIG, CHOLHDL, LDLDIRECT in the last 72 hours. Thyroid Function Tests: No results for input(s): TSH, T4TOTAL, FREET4, T3FREE, THYROIDAB in the last 72 hours. Anemia Panel: No results for input(s): VITAMINB12, FOLATE, FERRITIN, TIBC, IRON, RETICCTPCT in the last 72 hours. Sepsis Labs: No results for input(s): PROCALCITON, LATICACIDVEN in the last 168 hours.  Recent Results (from the past 240 hour(s))  Gram stain     Status: None   Collection Time: 01/09/19  8:49 PM  Result Value Ref Range Status   Specimen Description PLEURAL FLUID  Final   Special Requests NONE  Final   Gram Stain   Final    WBC PRESENT,BOTH PMN AND MONONUCLEAR NO ORGANISMS SEEN Performed at [facility], [address], [place], [place] [address]    Report Status 01/10/2019 FINAL  Final  Culture, body fluid-bottle     Status: None (Preliminary result)   Collection Time: 01/09/19  8:49 PM  Result Value Ref Range Status   Specimen Description PLEURAL FLUID  Final   Special Requests   Final    BOTTLES DRAWN AEROBIC ONLY Blood Culture adequate volume   Gram Stain   Final    GRAM POSITIVE COCCI AEROBIC BOTTLE ONLY CRITICAL RESULT CALLED TO, READ BACK BY AND VERIFIED WITH: PONSELLE RN AT 1000 ON [id number] BY SJW Performed at [facility], [name] [address], [place], [place]  [address]    Culture [facility] POSITIVE COCCI  Final   Report Status PENDING  Incomplete         Radiology Studies: Dg Chest Port 1 View  Result Date: 01/13/2019 CLINICAL DATA:  Follow-up pneumothorax EXAM: PORTABLE CHEST 1 VIEW COMPARISON:  01/12/2019 FINDINGS: Cardiac shadow is stable. Postsurgical changes are noted. The lungs are well aerated bilaterally without pneumothorax. Left chest tube is again seen in place. Small left pleural effusion is noted. Left basilar atelectasis is again seen and stable. Aortic calcifications are again noted. IMPRESSION: Stable appearance in the left base.  No pneumothorax is seen. Electronically Signed   By: Inez Catalina M.D.   On: 01/13/2019 07:51   Dg Chest Port 1 View  Result Date: 01/12/2019 CLINICAL DATA:  Pneumothorax. EXAM: PORTABLE CHEST 1 VIEW COMPARISON:  Radiograph of January 11, 2019. FINDINGS: Stable cardiomediastinal silhouette. Atherosclerosis of thoracic aorta is noted. Status post coronary artery bypass graft. Right lung is clear. Minimal left apical pneumothorax is noted which is significantly improved compared to prior exam. Left-sided chest tube noted on prior exam appears to be more lateral in position currently. Mild left pleural effusion is noted with probable underlying atelectasis or infiltrate. Bony thorax is unremarkable. IMPRESSION: Continued presence of left-sided chest tube. Minimal left apical pneumothorax is noted which is significantly improved compared to prior exam. Mild left pleural effusion is noted with probable underlying atelectasis or infiltrate. Electronically Signed   By: Marijo Conception M.D.   On: 01/12/2019 12:39        Scheduled Meds: . amiodarone  200 mg Oral Daily  . apixaban  5 mg Oral BID  . atorvastatin  80 mg Oral Daily  . furosemide  40 mg Oral Daily  . ipratropium-albuterol  3 mL Nebulization BID  . levothyroxine  50 mcg Oral Daily  . pantoprazole  40 mg Oral Daily  . sodium chloride flush  3 mL Intravenous  Q12H  . sodium chloride flush  3 mL Intravenous Q12H  . tamsulosin  0.4 mg Oral QPC supper   Continuous Infusions: . sodium chloride       LOS: 3 days        Aline August, MD [facility] 01/13/2019, 9:57 AM

## 2019-01-13 NOTE — Progress Notes (Signed)
NAME:  Eric Gordon, MRN:  [id number], DOB:  [birth year], LOS: 3 ADMISSION DATE:  01/09/2019, CONSULTATION DATE:  01/09/2019 REFERRING MD:  Noemi Chapel, MD , CHIEF COMPLAINT:  Chest pain     History of present illness   Mr. Eric Gordon is an 85 year old gentleman with history of CABG, CHF and atrial fibrillation recently started on amiodarone admitted with pneumothorax.   Past Medical History   has a past medical history of Abdominal aortic aneurysm (AAA) ([place]), Arthritis, CAD (coronary artery disease), Chronic anticoagulation, Chronic lower back pain, GERD (gastroesophageal reflux disease), High cholesterol, Hypertension, Hypothyroidism, OSA on CPAP, Pneumonia, Pulmonary embolism ([place]), and S/P CABG (coronary artery bypass graft).  Consults:    Procedures:  Chest tube (left) on 4/19 >   Significant Diagnostic Tests:  Chest x-ray-left hydropneumothorax  Micro Data:  Pleural culture: neg  Antimicrobials:  None Interval Events/Last 24 hours   Reports dyspnea is significantly improved. No longer having pleuritic chest pain. Ambulating to chair without assistance.  Objective   Blood pressure 129/72, pulse 70, temperature 98.1 F (36.7 C), temperature source Oral, resp. rate 18, height 6\' 5"  (1.956 m), weight 92.6 kg, SpO2 100 %.        Intake/Output Summary (Last 24 hours) at 01/13/2019 1159 Last data filed at 01/13/2019 0854 Gross per 24 hour  Intake 610 ml  Output 102 ml  Net 508 ml   Filed Weights   01/11/19 0400 01/12/19 0455 01/13/19 0500  Weight: 92.1 kg 92 kg 92.6 kg   Physical Exam: General: Well-appearing, elderly, no acute distress HENT: [place], AT, OP clear, MMM Eyes: EOMI, no scleral icterus Respiratory: Clear to auscultation bilaterally.  No crackles, wheezing or rales. Left chest tube in place with no air leak. Cardiovascular: RRR, -M/R/G, no JVD Extremities:-Edema,-tenderness Neuro: AAO x4, CNII-XII grossly intact  Assessment & Plan:  85 year old  male with 40 pack year history who is found with spontaneous left hydropneumothorax and eosinophilic left pleural effusion of unknown etiology. Initially had left effusion seen in February that was treated with antibiotics for presumed pneumonia. This admission, presents with left-sided PTX.  Spontaneous Left Hydropneumothorax Chest tube placed on 4/19. Reduced suction with recurrence of PTX so suction was returned to -20cmH20 in the last 24hours. CXR 4/23 personally reviewed by me which demonstrates no PTX x 2 days. -Place chest tube on waterseal -Repeat CXR at 1500 today. If stable, will remain on waterseal overnight -If patient reports worsening dyspnea or hemodynamically unstable, order STAT CXR -Continue scheduled bronchodilators -Will likely need a repeat CT prior to discharge to evaluate for any parenchymal abnormalities.  Eosinophilic pleural effusion secondary to pneumothorax Pleural fluid returned as exudate with eosinophil predominance. Etiology unclear though this may reflect irritation secondary to pneumothorax, recent infection or drug reaction. Could consider Plavix or amiodarone as a potential drug culprit as patient recently started on in Jan/Feb 2020 however documented adverse effects for eosinophilic pneumonia/effusion are rare. -Consider discussion with Cardiology for alternatives to Plavix and/or amiodarone   Labs   CBC: Recent Labs  Lab 01/09/19 1555 01/10/19 0438 01/13/19 0308  WBC 8.3 8.7 8.3  NEUTROABS  --  5.6 5.1  HGB 15.0 14.3 14.7  HCT 46.5 44.4 45.5  MCV 97.3 95.9 95.4  PLT 170 158 097    Basic Metabolic Panel: Recent Labs  Lab 01/09/19 1555 01/10/19 0438 01/13/19 0308  NA 138 138 138  K 3.9 4.0 3.9  CL 98 101 98  CO2 28 28 29   GLUCOSE  117* 99 108*  BUN 19 18 23   CREATININE 1.47* 1.24 1.36*  CALCIUM 9.4 9.0 8.8*  MG  --   --  1.8   GFR: Estimated Creatinine Clearance: 50 mL/min (A) (by C-G formula based on SCr of 1.36 mg/dL (H)). Recent  Labs  Lab 01/09/19 1555 01/10/19 0438 01/13/19 0308  WBC 8.3 8.7 8.3   ABG    Component Value Date/Time   PHART 7.417 11/14/2013 0820   PCO2ART 46.1 (H) 11/14/2013 0820   PO2ART 50.0 (L) 11/14/2013 0820   HCO3 25.6 10/21/2018 0531   TCO2 27 10/21/2018 0531   ACIDBASEDEF 1.0 12/11/2010 1902   O2SAT 53.0 10/21/2018 0531     Coagulation Profile: No results for input(s): INR, PROTIME in the last 168 hours.  Cardiac Enzymes: Recent Labs  Lab 01/09/19 1555  TROPONINI <0.03    Rodman Pickle, M.D. [facility] Pulmonary/Critical Care Medicine Pager: [phone] After hours pager: [phone]

## 2019-01-14 ENCOUNTER — Inpatient Hospital Stay (HOSPITAL_COMMUNITY): Payer: Medicare Other

## 2019-01-14 LAB — PATHOLOGIST SMEAR REVIEW: Path Review: INCREASED

## 2019-01-14 MED ORDER — FUROSEMIDE 40 MG PO TABS: 40.0000 mg | ORAL_TABLET | Freq: Every day | ORAL | Status: DC

## 2019-01-14 MED ORDER — OXYCODONE-ACETAMINOPHEN 5-325 MG PO TABS: 1.0000 | ORAL_TABLET | Freq: Four times a day (QID) | ORAL | 0 refills | Status: DC | PRN

## 2019-01-14 MED ORDER — ISOSORBIDE MONONITRATE ER 30 MG PO TB24: 60.0000 mg | ORAL_TABLET | Freq: Every day | ORAL | Status: DC

## 2019-01-14 MED ORDER — POLYETHYLENE GLYCOL 3350 17 G PO PACK: 17.0000 g | PACK | Freq: Every day | ORAL | 0 refills | Status: DC | PRN

## 2019-01-14 NOTE — Progress Notes (Signed)
Patient ID: Eric Gordon, male   DOB: Jan 28, 1931, 85 y.o.   MRN: [id number]  PROGRESS NOTE    Eric Gordon  LNL:[id number] DOB: [birth year] DOA: 01/09/2019 PCP: Shon Baton, MD   Brief Narrative:  85 year old male with history of hypertension, hyperlipidemia, CAD status post CABG, A. fib on Eliquis, chronic kidney disease stage III, OSA on CPAP, GERD, hypothyroidism presented on 01/09/2019 with sudden onset left-sided chest pain.  He was found to have spontaneous left hydropneumothorax for which chest tube was placed by ED provider.  PCCM was consulted.  Assessment & Plan:   Principal Problem:   Pneumothorax, left Active Problems:   Essential hypertension   Asthma, mild intermittent   Abdominal aortic aneurysm (HCC)   CAD (coronary artery disease)   Atrial fibrillation (HCC)   CKD (chronic kidney disease), stage III (HCC)   Chronic diastolic CHF (congestive heart failure) (HCC)   Hypothyroidism   Pleural effusion   Pneumothorax on left  Spontaneous left hydropneumothorax with left pleural effusion -Chest tube in place.  PCCM following and managing chest tube.  Chest x-ray showing improvement.  Hopefully the chest tube comes out today and patient probably can be discharged home tomorrow if cleared by pulmonary. -Pleural fluid was exudative with eosinophilic predominance.  Etiology unclear.  Cytology negative.  PCCM concerned about use of amiodarone and probable lung toxicity.  CAD status post CABG Chronic diastolic CHF Paroxysmal A. fib -Rate controlled.  Currently compensated and not in acute heart failure.  Continue  statin, Lasix and Eliquis.  Got in touch with Dr. Aundra Dubin who follows the patient as an outpatient and he is okay for amiodarone to be discontinued.  Amiodarone was discontinued on 01/13/2019. -Outpatient follow-up with cardiology.  Chronic kidney disease stage III -Creatinine stable on admission.  Monitor  OSA -On CPAP at home.  Would avoid using CPAP  at this time as pneumothorax might get worse  Hypothyroidism -Continue Synthroid  GERD Continue Protonix   DVT prophylaxis: Eliquis Code Status: Full Family Communication: None at bedside  disposition Plan: Home once chest tube is removed and once cleared by PCCM, most likely tomorrow tomorrow  Consultants: PCCM  Procedures: Chest tube placed on admission by ED provider  Antimicrobials: None   Subjective: Patient seen and examined at bedside.  Patient denies any worsening shortness of breath, nausea, vomiting or fever.  Objective: Vitals:   01/13/19 2321 01/14/19 0642 01/14/19 0755 01/14/19 0813  BP: 113/65   (!) 108/55  Pulse: 60   62  Resp: 16 (!) 22  18  Temp: (!) 97.4 F (36.3 C)   98.1 F (36.7 C)  TempSrc: Oral   Oral  SpO2: 95%  92% 96%  Weight:  92 kg    Height:        Intake/Output Summary (Last 24 hours) at 01/14/2019 0925 Last data filed at 01/14/2019 0824 Gross per 24 hour  Intake 600 ml  Output 812 ml  Net -212 ml   Filed Weights   01/12/19 0455 01/13/19 0500 01/14/19 0642  Weight: 92 kg 92.6 kg 92 kg    Examination:  General exam: Appears calm and comfortable.  No acute distress Respiratory system: Bilateral decreased breath sounds at bases with scattered crackles.   Left-sided chest tube present. Cardiovascular system: S1-S2 heard, rate controlled gastrointestinal system: Abdomen is nondistended, soft and nontender. Normal bowel sounds heard. Extremities: No cyanosis, edema    Data Reviewed: I have personally reviewed following labs and imaging studies  CBC: Recent Labs  Lab 01/09/19 1555 01/10/19 0438 01/13/19 0308  WBC 8.3 8.7 8.3  NEUTROABS  --  5.6 5.1  HGB 15.0 14.3 14.7  HCT 46.5 44.4 45.5  MCV 97.3 95.9 95.4  PLT 170 158 071   Basic Metabolic Panel: Recent Labs  Lab 01/09/19 1555 01/10/19 0438 01/13/19 0308  NA 138 138 138  K 3.9 4.0 3.9  CL 98 101 98  CO2 28 28 29   GLUCOSE 117* 99 108*  BUN 19 18 23    CREATININE 1.47* 1.24 1.36*  CALCIUM 9.4 9.0 8.8*  MG  --   --  1.8   GFR: Estimated Creatinine Clearance: 50 mL/min (A) (by C-G formula based on SCr of 1.36 mg/dL (H)). Liver Function Tests: Recent Labs  Lab 01/10/19 0438  AST 15  ALT 12  ALKPHOS 89  BILITOT 1.2  PROT 6.2*  ALBUMIN 3.4*   No results for input(s): LIPASE, AMYLASE in the last 168 hours. No results for input(s): AMMONIA in the last 168 hours. Coagulation Profile: No results for input(s): INR, PROTIME in the last 168 hours. Cardiac Enzymes: Recent Labs  Lab 01/09/19 1555  TROPONINI <0.03   BNP (last 3 results) No results for input(s): PROBNP in the last 8760 hours. HbA1C: No results for input(s): HGBA1C in the last 72 hours. CBG: No results for input(s): GLUCAP in the last 168 hours. Lipid Profile: No results for input(s): CHOL, HDL, LDLCALC, TRIG, CHOLHDL, LDLDIRECT in the last 72 hours. Thyroid Function Tests: No results for input(s): TSH, T4TOTAL, FREET4, T3FREE, THYROIDAB in the last 72 hours. Anemia Panel: No results for input(s): VITAMINB12, FOLATE, FERRITIN, TIBC, IRON, RETICCTPCT in the last 72 hours. Sepsis Labs: No results for input(s): PROCALCITON, LATICACIDVEN in the last 168 hours.  Recent Results (from the past 240 hour(s))  Gram stain     Status: None   Collection Time: 01/09/19  8:49 PM  Result Value Ref Range Status   Specimen Description PLEURAL FLUID  Final   Special Requests NONE  Final   Gram Stain   Final    WBC PRESENT,BOTH PMN AND MONONUCLEAR NO ORGANISMS SEEN Performed at [facility], [address] [place], [place] [address]    Report Status 01/10/2019 FINAL  Final  Culture, body fluid-bottle     Status: None (Preliminary result)   Collection Time: 01/09/19  8:49 PM  Result Value Ref Range Status   Specimen Description PLEURAL FLUID  Final   Special Requests   Final    BOTTLES DRAWN AEROBIC ONLY Blood Culture adequate volume   Gram Stain   Final    GRAM  POSITIVE COCCI AEROBIC BOTTLE ONLY CRITICAL RESULT CALLED TO, READ BACK BY AND VERIFIED WITH: PONSELLE RN AT 1000 ON [id number] BY SJW Performed at [facility], [place] [address], [place], [place] [address]    Culture [facility] POSITIVE COCCI  Final   Report Status PENDING  Incomplete         Radiology Studies: Dg Chest Port 1 View  Result Date: 01/14/2019 CLINICAL DATA:  Pneumothorax EXAM: PORTABLE CHEST 1 VIEW COMPARISON:  January 13, 2019 FINDINGS: Chest tube remains on the left, unchanged in position. Minimal left apical pneumothorax evident. There is airspace consolidation in the left base with small left pleural effusion. The right lung is clear. Heart is mildly enlarged with pulmonary vascularity normal. Patient is status post coronary artery bypass grafting. There is aortic atherosclerosis. There is calcification in the right innominate and subclavian arteries. No evident bone  lesions. IMPRESSION: Stable positioning of left chest tube with trace pneumothorax in the left apex. Persistent airspace consolidation left base with small left pleural effusion. Right lung clear. Stable cardiac prominence. There is postoperative change. Aortic Atherosclerosis (ICD10-I70.0). Electronically Signed   By: Lowella Grip III M.D.   On: 01/14/2019 08:06   Dg Chest Port 1 View  Result Date: 01/13/2019 CLINICAL DATA:  Patient with chest tube started feeling sharp left chest pain one hour ago similar to pain with original PTX on Sunday. EXAM: PORTABLE CHEST 1 VIEW COMPARISON:  01/13/2019 FINDINGS: There is a left-sided chest tube. There is left basilar airspace disease. There is a trace left pleural effusion. There is a small left apical pneumothorax. There is biapical pleural plaque. The heart and mediastinal contours are unremarkable. There is evidence of prior CABG. The osseous structures are unremarkable. IMPRESSION: 1. Left-sided chest tube with left basilar pleuroparenchymal disease. Trace left apical  pneumothorax. Electronically Signed   By: Kathreen Devoid   On: 01/13/2019 15:27   Dg Chest Port 1 View  Result Date: 01/13/2019 CLINICAL DATA:  Follow-up pneumothorax EXAM: PORTABLE CHEST 1 VIEW COMPARISON:  01/12/2019 FINDINGS: Cardiac shadow is stable. Postsurgical changes are noted. The lungs are well aerated bilaterally without pneumothorax. Left chest tube is again seen in place. Small left pleural effusion is noted. Left basilar atelectasis is again seen and stable. Aortic calcifications are again noted. IMPRESSION: Stable appearance in the left base.  No pneumothorax is seen. Electronically Signed   By: Inez Catalina M.D.   On: 01/13/2019 07:51   Dg Chest Port 1 View  Result Date: 01/12/2019 CLINICAL DATA:  Pneumothorax. EXAM: PORTABLE CHEST 1 VIEW COMPARISON:  Radiograph of January 11, 2019. FINDINGS: Stable cardiomediastinal silhouette. Atherosclerosis of thoracic aorta is noted. Status post coronary artery bypass graft. Right lung is clear. Minimal left apical pneumothorax is noted which is significantly improved compared to prior exam. Left-sided chest tube noted on prior exam appears to be more lateral in position currently. Mild left pleural effusion is noted with probable underlying atelectasis or infiltrate. Bony thorax is unremarkable. IMPRESSION: Continued presence of left-sided chest tube. Minimal left apical pneumothorax is noted which is significantly improved compared to prior exam. Mild left pleural effusion is noted with probable underlying atelectasis or infiltrate. Electronically Signed   By: Marijo Conception M.D.   On: 01/12/2019 12:39        Scheduled Meds: . apixaban  5 mg Oral BID  . atorvastatin  80 mg Oral Daily  . furosemide  40 mg Oral Daily  . ipratropium-albuterol  3 mL Nebulization BID  . levothyroxine  50 mcg Oral Daily  . pantoprazole  40 mg Oral Daily  . sodium chloride flush  3 mL Intravenous Q12H  . sodium chloride flush  3 mL Intravenous Q12H  . tamsulosin   0.4 mg Oral QPC supper   Continuous Infusions: . sodium chloride       LOS: 4 days        Aline August, MD [facility] 01/14/2019, 9:25 AM

## 2019-01-14 NOTE — Consult Note (Signed)
   [facility] CM Inpatient Consult   01/14/2019  Eric Gordon 07/05/1931 [id number]    Patient screened for potential [facility] Care Management services with his Medicare Next Gen plan. Patient has 29% high risk for unplanned readmissions and hospitalizations.   Chart review and transition of care CM coordination revealed disposition is to return to home with no identified barriers.   Patient had [place] outreach in the past.   Patient presented to the hospital for evaluation of chest pain-aching left-sided chest pain with occasional sharp components and found with left pneumothorax and left pleural effusion, treated with morphine and a chest tube was placed.  Per history and physical on 01/09/19 shows as follows: Patient is an 85 y.o. male with medical history significant for coronary artery disease status post CABG, chronic kidney disease stage III, chronic diastolic CHF, paroxysmal atrial fibrillation on Eliquis, hypothyroidism, and AAA.  Called patient in the room and spoke to him over the phone. Patient reports that he is going home with wife's care. He indicated having no current needs with medications, transportation and Facilities manager and [facility]). Patient seemed to be well informed in ways to manage his health conditions like checking daily weights, blood pressure, oxygen level and recording results; following diet restrictions and doing indoor/ outdoor exercises. He is aware of follow-up appointment with his primary care provider in a week (Dr. Shon Baton with [facility]).   Patient has agreed for continued follow-up of his recovery post discharge with [facility] General calls. Referral made for [facility] General calls after discharge.  If patient's post hospital needs change, please place a [facility] Care Management consult for community follow-up as appropriate.    For questions and additional information, please contact:  Crescentia Boutwell A. Selestino Nila, BSN, RN-BC [facility] Liaison Cell: [phone]

## 2019-01-14 NOTE — Care Management Important Message (Signed)
Important Message  Patient Details  Name: Eric Gordon MRN: [id number] Date of Birth: 28-Apr-1931   Medicare Important Message Given:  Yes    Tiahna Cure 01/14/2019, 2:10 PM

## 2019-01-14 NOTE — Progress Notes (Addendum)
NAME:  Eric Gordon, MRN:  [id number], DOB:  Aug 23, 1931, LOS: 4 ADMISSION DATE:  01/09/2019, CONSULTATION DATE:  01/09/2019 REFERRING MD:  Noemi Chapel, MD , CHIEF COMPLAINT:  Chest pain     History of present illness   Eric Gordon is an 85 year old gentleman with history of CABG, CHF and atrial fibrillation recently started on amiodarone admitted with pneumothorax.   Past Medical History   has a past medical history of Abdominal aortic aneurysm (AAA) ([place]), Arthritis, CAD (coronary artery disease), Chronic anticoagulation, Chronic lower back pain, GERD (gastroesophageal reflux disease), High cholesterol, Hypertension, Hypothyroidism, OSA on CPAP, Pneumonia, Pulmonary embolism ([place]), and S/P CABG (coronary artery bypass graft).  Consults:    Procedures:  Chest tube (left) on 4/19 >   Significant Diagnostic Tests:  Chest x-ray-left hydropneumothorax  Micro Data:  Pleural culture: neg  Antimicrobials:  None Interval Events/Last 24 hours   Reports improved dyspnea. No respiratory complaints after chest tube was placed on waterseal.  Objective   Blood pressure (!) 108/55, pulse 62, temperature 98.1 F (36.7 C), temperature source Oral, resp. rate 18, height 6\' 5"  (1.956 m), weight 92 kg, SpO2 96 %.        Intake/Output Summary (Last 24 hours) at 01/14/2019 1211 Last data filed at 01/14/2019 0824 Gross per 24 hour  Intake 600 ml  Output 812 ml  Net -212 ml   Filed Weights   01/12/19 0455 01/13/19 0500 01/14/19 0642  Weight: 92 kg 92.6 kg 92 kg   Physical Exam: General: Well-appearing, elderly, no acute distress HENT: [place], AT, OP clear, MMM Eyes: EOMI, no scleral icterus Respiratory: Clear to auscultation bilaterally.  No crackles, wheezing or rales. Left chest tube in place Cardiovascular: RRR, -M/R/G, no JVD Neuro: AAO x4, CNII-XII grossly intact  Assessment & Plan:  85 year old male with 40 pack year history admitted for spontaneous left pneumothorax. Also  found with eosinophilic left pleural effusion of unknown etiology.  Spontaneous Left Hydropneumothorax Chest tube placed on 4/19. CXR 4/24 reviewed personally by me with small, stable apical left PTX and small left effusion. -Chest tube removed -Repeat PM CXR -If stable, OK to discharge home from pulmonary standpoint -Continue scheduled bronchodilators -May consider CXR or CT as outpatient with PCP in 1-2 weeks. 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 Etiology unclear though this may reflect irritation secondary to pneumothorax, recent infection or drug reaction. Could consider Plavix or amiodarone as a potential drug culprit as patient recently started on in Jan/Feb 2020 however documented adverse effects for eosinophilic pneumonia/effusion are rare.  Pulmonary will sign off. Please call for questions or concerns.  Labs   CBC: Recent Labs  Lab 01/09/19 1555 01/10/19 0438 01/13/19 0308  WBC 8.3 8.7 8.3  NEUTROABS  --  5.6 5.1  HGB 15.0 14.3 14.7  HCT 46.5 44.4 45.5  MCV 97.3 95.9 95.4  PLT 170 158 024    Basic Metabolic Panel: Recent Labs  Lab 01/09/19 1555 01/10/19 0438 01/13/19 0308  NA 138 138 138  K 3.9 4.0 3.9  CL 98 101 98  CO2 28 28 29   GLUCOSE 117* 99 108*  BUN 19 18 23   CREATININE 1.47* 1.24 1.36*  CALCIUM 9.4 9.0 8.8*  MG  --   --  1.8   GFR: Estimated Creatinine Clearance: 50 mL/min (A) (by C-G formula based on SCr of 1.36 mg/dL (H)). Recent Labs  Lab 01/09/19 1555 01/10/19 0438 01/13/19 0308  WBC 8.3 8.7 8.3   Greater than 50% of this patient 25-minute visit was spent face-to-face in counseling with the patient/family and coordinating care. We discussed medical diagnosis and treatment plan as noted. Also discussed plan of care with Primary Attending and RN at bedside.  Rodman Pickle, M.D. [facility]  Pulmonary/Critical Care Medicine Pager: [phone] After hours pager: [phone]

## 2019-01-14 NOTE — TOC Transition Note (Signed)
Transition of Care [facility]) - CM/SW Discharge Note Marvetta Gibbons RN, BSN Transitions of Care Unit 4E- RN Case Manager [phone]  Patient Details  Name: Eric Gordon MRN: [id number] Date of Birth: 04/22/1931  Transition of Care [facility]) CM/SW Contact:  Dawayne Patricia, RN Phone Number: 01/14/2019, 2:29 PM   Clinical Narrative:    Pt admitted with pntx, pl. Effusion and CT placement. Pt has had CT removed and cleared for transition home. No transition needs noted. Pt to return home with wife.    Final next level of care: Home/Self Care Barriers to Discharge: No Barriers Identified   Patient Goals and CMS Choice Patient states their goals for this hospitalization and ongoing recovery are:: "to return home" CMS Medicare.gov Compare Post Acute Care list provided to:: Patient Choice offered to / list presented to : NA  Discharge Placement  Home with wife.                      Discharge Plan and Services   Discharge Planning Services: CM Consult Post Acute Care Choice: NA          DME Arranged: N/A DME Agency: NA       HH Arranged: NA HH Agency: NA        Social Determinants of Health (SDOH) Interventions     Readmission Risk Interventions Readmission Risk Prevention Plan 01/14/2019  Transportation Screening Complete  PCP or Specialist Appt within 3-5 Days Complete  HRI or [place] Complete  Social Work Consult for [place] Planning/Counseling Complete  Palliative Care Screening Not Applicable  Medication Review Press photographer) Complete  Some recent data might be hidden

## 2019-01-14 NOTE — Progress Notes (Signed)
D/C instructions given to pt. Medications and wound care instructions discussed. All questions answered. IV removed, clean and intact. Pt to be escort home by wife.   Clyde Canterbury, RN

## 2019-01-14 NOTE — Discharge Summary (Signed)
Physician Discharge Summary  ECHO ALLSBROOK ZCH:[id number] DOB: 1930-12-06 DOA: 01/09/2019  PCP: Shon Baton, MD  Admit date: 01/09/2019 Discharge date: 01/14/2019  Admitted From: Home Disposition: Home  Recommendations for Outpatient Follow-up:  1. Follow up with PCP in 1 week  2. Patient will need repeat chest x-ray as an outpatient in the next 1 or 2 weeks 3. Outpatient follow-up with pulmonary if needed if symptoms do not improve 4. Outpatient follow-up with cardiology 5. Follow up in ED if symptoms worsen or new appear   Home Health: No Equipment/Devices: None  Discharge Condition: Stable CODE STATUS: Full Diet recommendation: Heart healthy  Brief/Interim Summary: 85 year old male with history of hypertension, hyperlipidemia, CAD status post CABG, A. fib on Eliquis, chronic kidney disease stage III, OSA on CPAP, GERD, hypothyroidism presented on 01/09/2019 with sudden onset left-sided chest pain.  He was found to have spontaneous left hydropneumothorax for which chest tube was placed by ED provider.  PCCM was consulted.  During the hospitalization, his condition gradually improved.  Pneumothorax gradually improved.  Chest tube has been removed today by PCCM.  If repeat chest x-ray this afternoon is stable, he will be discharged home as PCCM has cleared him for discharge.   Discharge Diagnoses:  Principal Problem:   Pneumothorax, left Active Problems:   Essential hypertension   Asthma, mild intermittent   Abdominal aortic aneurysm (HCC)   CAD (coronary artery disease)   Atrial fibrillation (HCC)   CKD (chronic kidney disease), stage III (HCC)   Chronic diastolic CHF (congestive heart failure) (HCC)   Hypothyroidism   Pleural effusion   Pneumothorax on left  Spontaneous left hydropneumothorax with left pleural effusion -Treated with chest tube. PCCM following and managing chest tube.  Chest x-ray showing improvement.  Chest tube has been removed today by PCCM.  If  repeat chest x-ray this afternoon is stable, patient can be discharged home as per PCCM.  he will need a repeat chest x-ray in the next 1 or 2 weeks by his primary care provider.  Outpatient follow-up with PCCM if needed. -Pleural fluid was exudative with eosinophilic predominance.  Etiology unclear.  Cytology negative.  PCCM concerned about use of amiodarone and probable lung toxicity.  Amiodarone discontinued on 01/13/2019.  Patient is currently not on Plavix as an outpatient.  CAD status post CABG Chronic diastolic CHF Paroxysmal A. fib -Rate controlled.  Currently compensated and not in acute heart failure.  Continue  statin, Lasix and Eliquis.  Got in touch with Dr. Aundra Dubin who follows the patient as an outpatient and he is okay for amiodarone to be discontinued.  Amiodarone was discontinued on 01/13/2019. -Outpatient follow-up with cardiology.  Chronic kidney disease stage III -Creatinine stable on admission.  Monitor  OSA -On CPAP at home.    Hypothyroidism -Continue Synthroid  GERD Continue Protonix   Discharge Instructions  Discharge Instructions    Ambulatory referral to Cardiology   Complete by:  As directed    Follow-up for CHF   Call MD for:  difficulty breathing, headache or visual disturbances   Complete by:  As directed    Call MD for:  severe uncontrolled pain   Complete by:  As directed    Diet - low sodium heart healthy   Complete by:  As directed    Increase activity slowly   Complete by:  As directed      Allergies as of 01/14/2019      Reactions   Ancef [cefazolin Sodium] Hives   Vancomycin  Rash      Medication List    STOP taking these medications   acetaminophen 500 MG tablet Commonly known as:  TYLENOL   amiodarone 200 MG tablet Commonly known as:  PACERONE   carvedilol 6.25 MG tablet Commonly known as:  COREG     TAKE these medications   apixaban 5 MG Tabs tablet Commonly known as:  ELIQUIS Take 1 tablet (5 mg total) by mouth 2  (two) times daily.   Ascorbic Acid 500 MG Caps Take 500 mg by mouth daily.   atorvastatin 80 MG tablet Commonly known as:  LIPITOR Take 1 tablet (80 mg total) by mouth daily.   CENTRUM SILVER PO Take 1 tablet by mouth daily.   cetirizine 10 MG tablet Commonly known as:  ZYRTEC Take 10 mg by mouth daily.   cholecalciferol 1000 units tablet Commonly known as:  VITAMIN D Take 1,000 Units by mouth daily.   furosemide 40 MG tablet Commonly known as:  LASIX Take 1 tablet (40 mg total) by mouth daily.   isosorbide mononitrate 30 MG 24 hr tablet Commonly known as:  IMDUR Take 2 tablets (60 mg total) by mouth daily.   levothyroxine 50 MCG tablet Commonly known as:  SYNTHROID Take 50 mcg by mouth daily.   oxyCODONE-acetaminophen 5-325 MG tablet Commonly known as:  PERCOCET/ROXICET Take 1 tablet by mouth every 6 (six) hours as needed for moderate pain.   pantoprazole 40 MG tablet Commonly known as:  PROTONIX Take 40 mg by mouth daily.   polyethylene glycol 17 g packet Commonly known as:  MIRALAX / GLYCOLAX Take 17 g by mouth daily as needed for mild constipation.   tamsulosin 0.4 MG Caps capsule Commonly known as:  FLOMAX Take 0.4 mg by mouth daily after supper.      Follow-up Information    Shon Baton, MD. Schedule an appointment as soon as possible for a visit in 1 week(s).   Specialty:  Internal Medicine Contact information: [place] [address] [phone]        Larey Dresser, MD .   Specialty:  Cardiology Contact information: [address] [place] [address] [phone]          Allergies  Allergen Reactions  . Ancef [Cefazolin Sodium] Hives  . Vancomycin Rash    Consultations:  PCCM   Procedures/Studies: Dg Chest Port 1 View  Result Date: 01/14/2019 CLINICAL DATA:  Pneumothorax EXAM: PORTABLE CHEST 1 VIEW COMPARISON:  January 13, 2019 FINDINGS: Chest tube remains on the left, unchanged in position. Minimal  left apical pneumothorax evident. There is airspace consolidation in the left base with small left pleural effusion. The right lung is clear. Heart is mildly enlarged with pulmonary vascularity normal. Patient is status post coronary artery bypass grafting. There is aortic atherosclerosis. There is calcification in the right innominate and subclavian arteries. No evident bone lesions. IMPRESSION: Stable positioning of left chest tube with trace pneumothorax in the left apex. Persistent airspace consolidation left base with small left pleural effusion. Right lung clear. Stable cardiac prominence. There is postoperative change. Aortic Atherosclerosis (ICD10-I70.0). Electronically Signed   By: Lowella Grip III M.D.   On: 01/14/2019 08:06   Dg Chest Port 1 View  Result Date: 01/13/2019 CLINICAL DATA:  Patient with chest tube started feeling sharp left chest pain one hour ago similar to pain with original PTX on Sunday. EXAM: PORTABLE CHEST 1 VIEW COMPARISON:  01/13/2019 FINDINGS: There is a left-sided chest tube. There  is left basilar airspace disease. There is a trace left pleural effusion. There is a small left apical pneumothorax. There is biapical pleural plaque. The heart and mediastinal contours are unremarkable. There is evidence of prior CABG. The osseous structures are unremarkable. IMPRESSION: 1. Left-sided chest tube with left basilar pleuroparenchymal disease. Trace left apical pneumothorax. Electronically Signed   By: Kathreen Devoid   On: 01/13/2019 15:27   Dg Chest Port 1 View  Result Date: 01/13/2019 CLINICAL DATA:  Follow-up pneumothorax EXAM: PORTABLE CHEST 1 VIEW COMPARISON:  01/12/2019 FINDINGS: Cardiac shadow is stable. Postsurgical changes are noted. The lungs are well aerated bilaterally without pneumothorax. Left chest tube is again seen in place. Small left pleural effusion is noted. Left basilar atelectasis is again seen and stable. Aortic calcifications are again noted. IMPRESSION:  Stable appearance in the left base.  No pneumothorax is seen. Electronically Signed   By: Inez Catalina M.D.   On: 01/13/2019 07:51   Dg Chest Port 1 View  Result Date: 01/12/2019 CLINICAL DATA:  Pneumothorax. EXAM: PORTABLE CHEST 1 VIEW COMPARISON:  Radiograph of January 11, 2019. FINDINGS: Stable cardiomediastinal silhouette. Atherosclerosis of thoracic aorta is noted. Status post coronary artery bypass graft. Right lung is clear. Minimal left apical pneumothorax is noted which is significantly improved compared to prior exam. Left-sided chest tube noted on prior exam appears to be more lateral in position currently. Mild left pleural effusion is noted with probable underlying atelectasis or infiltrate. Bony thorax is unremarkable. IMPRESSION: Continued presence of left-sided chest tube. Minimal left apical pneumothorax is noted which is significantly improved compared to prior exam. Mild left pleural effusion is noted with probable underlying atelectasis or infiltrate. Electronically Signed   By: Marijo Conception M.D.   On: 01/12/2019 12:39   Dg Chest Port 1 View  Result Date: 01/11/2019 CLINICAL DATA:  Pneumothorax. EXAM: PORTABLE CHEST 1 VIEW COMPARISON:  Radiograph of January 10, 2019. FINDINGS: Stable cardiomediastinal silhouette. Atherosclerosis of thoracic aorta is noted. Status post coronary bypass graft. Right lung is clear. Stable position of left-sided chest tube. Moderate size left pneumothorax is noted which is significantly increased in size compared to prior exam. Bony thorax is unremarkable. IMPRESSION: Left-sided pneumothorax is significantly enlarged compared to prior exam. Left-sided chest tube is unchanged in position. Aortic Atherosclerosis (ICD10-I70.0). Electronically Signed   By: Marijo Conception M.D.   On: 01/11/2019 08:12   Dg Chest Port 1 View  Result Date: 01/10/2019 CLINICAL DATA:  Chest pain.  Recent pneumothorax EXAM: PORTABLE CHEST 1 VIEW COMPARISON:  January 09, 2019. FINDINGS:  Chest tube position on the left stable. There is a small a pickle lateral pneumothorax on the left, unchanged. There is a small left pleural effusion. There is focal airspace consolidation in the left base. The right lung is clear. Heart is mildly enlarged with pulmonary vascularity normal. Patient is status post coronary artery bypass grafting. There is aortic atherosclerosis. No adenopathy. There is degenerative change in the right shoulder with acromioclavicular separation on the right. IMPRESSION: Chest tube position unchanged. Small pneumothorax on the left is stable. There is consolidation in the left base, slightly increased compared to 1 day prior. There is a small pleural effusion on the left. Right lung is clear. Stable cardiac prominence. Aortic Atherosclerosis (ICD10-I70.0). Electronically Signed   By: Lowella Grip III M.D.   On: 01/10/2019 07:54   Dg Chest Portable 1 View  Result Date: 01/09/2019 CLINICAL DATA:  Follow-up pneumothorax following LEFT thoracostomy tube placement. EXAM:  PORTABLE CHEST 1 VIEW COMPARISON:  01/09/2019 prior radiographs FINDINGS: A LEFT pigtail thoracostomy tube is noted. A tiny residual LEFT apical/lateral pneumothorax (less than 5%). LEFT basilar consolidation/atelectasis is noted. Cardiomegaly and CABG changes are noted. No acute bony abnormality noted. IMPRESSION: LEFT thoracostomy tube placement with tiny residual LEFT pneumothorax. Continued LEFT basilar consolidation/atelectasis. Electronically Signed   By: Margarette Canada M.D.   On: 01/09/2019 20:18   Dg Chest Port 1 View  Addendum Date: 01/09/2019   ADDENDUM REPORT: 01/09/2019 17:35 ADDENDUM: Critical Value/emergent results were called by telephone at the time of interpretation on 01/09/2019 at 1731 hours to Dr. Noemi Chapel who verbally acknowledged these results. Electronically Signed   By: Genevie Ann M.D.   On: 01/09/2019 17:35   Result Date: 01/09/2019 CLINICAL DATA:  85 year old male with left chest pain.  EXAM: PORTABLE CHEST 1 VIEW COMPARISON:  Chest CT and radiographs 10/24/2018 and earlier. FINDINGS: Portable AP upright view at 1657 hours. Moderate to large left pneumothorax with superimposed lung base pleural effusion and/or consolidation. Other atelectatic lung is gathered in the medial left hemithorax. Stable cardiac size and mediastinal contours. Prior CABG. Visualized tracheal air column is within normal limits. No midline shift. No left rib fracture is evident. The right lung appears stable in clear. Negative visible bowel gas pattern. IMPRESSION: 1. Moderate to large left pneumothorax but no mediastinal shift to suggest a component of tension. 2. Superimposed left pleural effusion and/or lung base consolidation. 3. No left rib fracture or other acute cardiopulmonary abnormality identified. Electronically Signed: By: Genevie Ann M.D. On: 01/09/2019 17:28       Subjective: Patient seen and examined at bedside.  Patient denies any worsening shortness of breath, nausea, vomiting or fever.   Discharge Exam: Vitals:   01/14/19 0755 01/14/19 0813  BP:  (!) 108/55  Pulse:  62  Resp:  18  Temp:  98.1 F (36.7 C)  SpO2: 92% 96%    General exam: Appears calm and comfortable.  No acute distress Respiratory system: Bilateral decreased breath sounds at bases with scattered crackles.  Cardiovascular system: S1-S2 heard, rate controlled gastrointestinal system: Abdomen is nondistended, soft and nontender. Normal bowel sounds heard. Extremities: No cyanosis, edema      The results of significant diagnostics from this hospitalization (including imaging, microbiology, ancillary and laboratory) are listed below for reference.     Microbiology: Recent Results (from the past 240 hour(s))  Gram stain     Status: None   Collection Time: 01/09/19  8:49 PM  Result Value Ref Range Status   Specimen Description PLEURAL FLUID  Final   Special Requests NONE  Final   Gram Stain   Final    WBC  PRESENT,BOTH PMN AND MONONUCLEAR NO ORGANISMS SEEN Performed at [facility], [address] [place], [place] [address]    Report Status 01/10/2019 FINAL  Final  Culture, body fluid-bottle     Status: Abnormal (Preliminary result)   Collection Time: 01/09/19  8:49 PM  Result Value Ref Range Status   Specimen Description PLEURAL FLUID  Final   Special Requests   Final    BOTTLES DRAWN AEROBIC ONLY Blood Culture adequate volume   Gram Stain   Final    GRAM POSITIVE COCCI AEROBIC BOTTLE ONLY CRITICAL RESULT CALLED TO, READ BACK BY AND VERIFIED WITH: PONSELLE RN AT 1000 ON [id number] BY SJW Performed at [facility], [place] [address], [place], [place] [address]    Culture KOCURIA SPECIES (A)  Final   Report Status PENDING  Incomplete     Labs: BNP (last 3 results) Recent Labs    10/21/18 0520 11/01/18 1042 01/09/19 1555  BNP 720.2* 842.5* 638.9*   Basic Metabolic Panel: Recent Labs  Lab 01/09/19 1555 01/10/19 0438 01/13/19 0308  NA 138 138 138  K 3.9 4.0 3.9  CL 98 101 98  CO2 28 28 29   GLUCOSE 117* 99 108*  BUN 19 18 23   CREATININE 1.47* 1.24 1.36*  CALCIUM 9.4 9.0 8.8*  MG  --   --  1.8   Liver Function Tests: Recent Labs  Lab 01/10/19 0438  AST 15  ALT 12  ALKPHOS 89  BILITOT 1.2  PROT 6.2*  ALBUMIN 3.4*   No results for input(s): LIPASE, AMYLASE in the last 168 hours. No results for input(s): AMMONIA in the last 168 hours. CBC: Recent Labs  Lab 01/09/19 1555 01/10/19 0438 01/13/19 0308  WBC 8.3 8.7 8.3  NEUTROABS  --  5.6 5.1  HGB 15.0 14.3 14.7  HCT 46.5 44.4 45.5  MCV 97.3 95.9 95.4  PLT 170 158 162   Cardiac Enzymes: Recent Labs  Lab 01/09/19 1555  TROPONINI <0.03   BNP: Invalid input(s): POCBNP CBG: No results for input(s): GLUCAP in the last 168 hours. D-Dimer No results for input(s): DDIMER in the last 72 hours. Hgb A1c No results for input(s): HGBA1C in the last 72 hours. Lipid Profile No results for input(s): CHOL,  HDL, LDLCALC, TRIG, CHOLHDL, LDLDIRECT in the last 72 hours. Thyroid function studies No results for input(s): TSH, T4TOTAL, T3FREE, THYROIDAB in the last 72 hours.  Invalid input(s): FREET3 Anemia work up No results for input(s): VITAMINB12, FOLATE, FERRITIN, TIBC, IRON, RETICCTPCT in the last 72 hours. Urinalysis    Component Value Date/Time   COLORURINE AMBER (A) 03/19/2018 1456   APPEARANCEUR HAZY (A) 03/19/2018 1456   LABSPEC 1.027 03/19/2018 1456   PHURINE 5.0 03/19/2018 1456   GLUCOSEU NEGATIVE 03/19/2018 1456   HGBUR NEGATIVE 03/19/2018 1456   BILIRUBINUR NEGATIVE 03/19/2018 1456   KETONESUR 5 (A) 03/19/2018 1456   PROTEINUR 100 (A) 03/19/2018 1456   UROBILINOGEN 0.2 12/23/2010 2026   NITRITE NEGATIVE 03/19/2018 1456   LEUKOCYTESUR NEGATIVE 03/19/2018 1456   Sepsis Labs Invalid input(s): PROCALCITONIN,  WBC,  LACTICIDVEN Microbiology Recent Results (from the past 240 hour(s))  Gram stain     Status: None   Collection Time: 01/09/19  8:49 PM  Result Value Ref Range Status   Specimen Description PLEURAL FLUID  Final   Special Requests NONE  Final   Gram Stain   Final    WBC PRESENT,BOTH PMN AND MONONUCLEAR NO ORGANISMS SEEN Performed at [facility], [place] [address] [place], [place] [address]    Report Status 01/10/2019 FINAL  Final  Culture, body fluid-bottle     Status: Abnormal (Preliminary result)   Collection Time: 01/09/19  8:49 PM  Result Value Ref Range Status   Specimen Description PLEURAL FLUID  Final   Special Requests   Final    BOTTLES DRAWN AEROBIC ONLY Blood Culture adequate volume   Gram Stain   Final    GRAM POSITIVE COCCI AEROBIC BOTTLE ONLY CRITICAL RESULT CALLED TO, READ BACK BY AND VERIFIED WITH: PONSELLE RN AT 1000 ON [id number] BY SJW Performed at [facility], [place] [address] [place], [place] [address]    Culture KOCURIA SPECIES (A)  Final   Report Status PENDING  Incomplete  Time coordinating discharge: 35  minutes  SIGNED:   Aline August, MD  [facility] 01/14/2019, 10:38 AM

## 2019-01-15 LAB — CULTURE, BODY FLUID W GRAM STAIN -BOTTLE: Special Requests: ADEQUATE

## 2019-01-17 DIAGNOSIS — I480 Paroxysmal atrial fibrillation: Secondary | ICD-10-CM | POA: Diagnosis not present

## 2019-01-17 DIAGNOSIS — E039 Hypothyroidism, unspecified: Secondary | ICD-10-CM | POA: Diagnosis not present

## 2019-01-17 DIAGNOSIS — I350 Nonrheumatic aortic (valve) stenosis: Secondary | ICD-10-CM | POA: Diagnosis not present

## 2019-01-17 DIAGNOSIS — I5022 Chronic systolic (congestive) heart failure: Secondary | ICD-10-CM | POA: Diagnosis not present

## 2019-01-17 DIAGNOSIS — I130 Hypertensive heart and chronic kidney disease with heart failure and stage 1 through stage 4 chronic kidney disease, or unspecified chronic kidney disease: Secondary | ICD-10-CM | POA: Diagnosis not present

## 2019-01-17 DIAGNOSIS — J939 Pneumothorax, unspecified: Secondary | ICD-10-CM | POA: Diagnosis not present

## 2019-01-17 DIAGNOSIS — E669 Obesity, unspecified: Secondary | ICD-10-CM | POA: Diagnosis not present

## 2019-01-17 DIAGNOSIS — E119 Type 2 diabetes mellitus without complications: Secondary | ICD-10-CM | POA: Diagnosis not present

## 2019-01-17 DIAGNOSIS — I2510 Atherosclerotic heart disease of native coronary artery without angina pectoris: Secondary | ICD-10-CM | POA: Diagnosis not present

## 2019-01-17 DIAGNOSIS — J90 Pleural effusion, not elsewhere classified: Secondary | ICD-10-CM | POA: Diagnosis not present

## 2019-01-17 DIAGNOSIS — Z758 Other problems related to medical facilities and other health care: Secondary | ICD-10-CM | POA: Diagnosis not present

## 2019-01-20 DIAGNOSIS — E038 Other specified hypothyroidism: Secondary | ICD-10-CM | POA: Diagnosis not present

## 2019-01-20 DIAGNOSIS — E119 Type 2 diabetes mellitus without complications: Secondary | ICD-10-CM | POA: Diagnosis not present

## 2019-01-20 DIAGNOSIS — I130 Hypertensive heart and chronic kidney disease with heart failure and stage 1 through stage 4 chronic kidney disease, or unspecified chronic kidney disease: Secondary | ICD-10-CM | POA: Diagnosis not present

## 2019-01-20 DIAGNOSIS — J90 Pleural effusion, not elsewhere classified: Secondary | ICD-10-CM | POA: Diagnosis not present

## 2019-01-24 ENCOUNTER — Telehealth: Payer: Self-pay | Admitting: Internal Medicine

## 2019-01-24 NOTE — Telephone Encounter (Signed)
Called and spoke with patient regarding referral appt for 02-07-19 with MW This is the only earliest time available; pt agreed and understood Pt advised that if he passes out more or has increase SOB he will go back to hospital Pt verbalized understanding Nothing further needed

## 2019-01-26 ENCOUNTER — Telehealth (HOSPITAL_COMMUNITY): Payer: Self-pay

## 2019-01-26 ENCOUNTER — Other Ambulatory Visit: Payer: Self-pay

## 2019-01-26 ENCOUNTER — Telehealth: Payer: Self-pay | Admitting: Internal Medicine

## 2019-01-26 ENCOUNTER — Ambulatory Visit (HOSPITAL_COMMUNITY)
Admission: RE | Admit: 2019-01-26 | Discharge: 2019-01-26 | Disposition: A | Discharge status: Home / Self Care | Payer: Medicare Other | Source: Ambulatory Visit | Attending: Internal Medicine | Admitting: Internal Medicine

## 2019-01-26 VITALS — BP 128/79 | Wt 207.0 lb

## 2019-01-26 DIAGNOSIS — J939 Pneumothorax, unspecified: Secondary | ICD-10-CM

## 2019-01-26 DIAGNOSIS — I5032 Chronic diastolic (congestive) heart failure: Secondary | ICD-10-CM

## 2019-01-26 DIAGNOSIS — R0602 Shortness of breath: Secondary | ICD-10-CM

## 2019-01-26 DIAGNOSIS — I2510 Atherosclerotic heart disease of native coronary artery without angina pectoris: Secondary | ICD-10-CM

## 2019-01-26 DIAGNOSIS — R0609 Other forms of dyspnea: Secondary | ICD-10-CM

## 2019-01-26 DIAGNOSIS — I480 Paroxysmal atrial fibrillation: Secondary | ICD-10-CM | POA: Diagnosis not present

## 2019-01-26 NOTE — Progress Notes (Signed)
Heart Failure TeleHealth Note  Due to national recommendations of social distancing due to [place] 19, Audio/video telehealth visit is felt to be most appropriate for this patient at this time.  See MyChart message from today for patient consent regarding telehealth for [facility].  Date:  01/26/2019   ID:  Eric Gordon, DOB October 01, 1930, MRN [id number]  Location: Home  Provider location: [place] [facility] Type of Visit: Established patient   PCP:  Shon Baton, MD  Cardiologist:  No primary care provider on file. Primary HF: Dr Aundra Dubin   Chief Complaint: Heart Failure  History of Present Illness: Eric Gordon is a 85 y.o. male with a history of history of CAD s/p CABG. He has had trouble long-term with exertional dyspnea.  Dyspnea triggered evaluation in 2012 leading to CABG but was not resolved by CABG. PFTs in 3/14 showed only mild obstructive airways disease and V/Q scan showed no PE. Echo in 9/14 showed normal LV systolic function with moderately dilated RV. Mitchell in 2/15 showed normal right and left heart filling pressures and normal PA pressure. Finally, he had a CPX in 3/15 that showed normal capacity compared to age-matched sedentary norms. He was noted to have chronotropic incompetence. At a prior appointment, he was taken off metoprolol given chronotropic incompetence noted on CPX. Dyspnea improved significantly with weight loss. He had Cardiolite in 8/18 with EF 52%, no ischemia or infarction.   He was admitted in 1/20 with NSTEMI. LHC showed new occlusion of the branch of SVG-ramus and OM that touched down on OM. There were also serial 70% stenoses in the mid/distal RCA. There was not thought to be an interventional option and patient was treated medically. He was discharged home but presented a couple days later with recurrent chest pain and dyspnea. Cath was repeated showing no change from prior. This admission, he had FFR of the RCA which  did not suggest hemodynamic significance. Echo showed EF 55-60%, normal RV. CTA did not show a PE. He was noted to be volume overloaded and was diuresed. He was also noted to be in atrial fibrillation with RVR transiently. ASA/Plavix was stopped and Eliquis was started. He was discharged to SNF.  Atrial fibrillation recurred and he was started on amiodarone with conversion back to NSR.   He was admitted the end of April with left sided chest pain. He had spontaneous hydropneumothorax and required a chest tube. As he improved chest tube was removed. He will follow up with pulmonary in a few weeks for CXR.   He had follow up with Dr Virgina Jock on 01/21/19 and he said pneumothrorax is returning.   He presents via [employer] (consulting) for a telehealth visit today.   Overall feeling poorly. He is reporting increased shortness of breath. SOB with minimal exertion. Says he is starting have similar chest pain from last week 3/10. Says he has been gradually getting worse. Denies PND/Orthopnea. SBP has been 80-120.  Appetite ok. No fever or chills. Weight at home 207 pounds. Taking all medications.  he denies symptoms worrisome for COVID 19.   Past Medical History:  Diagnosis Date  . Abdominal aortic aneurysm (AAA) ([place])   . Arthritis    "my whole body" (03/19/2018)  . CAD (coronary artery disease)   . Chronic anticoagulation    on coumadin for PE  . Chronic lower back pain   . GERD (gastroesophageal reflux disease)   . High cholesterol   . Hypertension   .  Hypothyroidism   . OSA on CPAP   . Pneumonia    "now and once before" (03/19/2018)  . Pulmonary embolism [facility])    April 2012 after CABG  . S/P CABG (coronary artery bypass graft)    Past Surgical History:  Procedure Laterality Date  . CARDIAC CATHETERIZATION  11/2010  . CORONARY ANGIOGRAPHY N/A 10/22/2018   Procedure: CORONARY ANGIOGRAPHY;  Surgeon: Troy Sine, MD;  Location: [place] [facility];  Service: Cardiovascular;  Laterality:  N/A;  . CORONARY ARTERY BYPASS GRAFT  March 2012   CABG X 5  . INGUINAL HERNIA REPAIR Left 1980  . INTRAVASCULAR PRESSURE WIRE/FFR STUDY N/A 10/22/2018   Procedure: INTRAVASCULAR PRESSURE WIRE/FFR STUDY;  Surgeon: Troy Sine, MD;  Location: [place] [facility];  Service: Cardiovascular;  Laterality: N/A;  . KNEE ARTHROSCOPY Left 2006  . LEFT HEART CATH AND CORS/GRAFTS ANGIOGRAPHY N/A 10/18/2018   Procedure: LEFT HEART CATH AND CORS/GRAFTS ANGIOGRAPHY;  Surgeon: Lorretta Harp, MD;  Location: [place] [facility];  Service: Cardiovascular;  Laterality: N/A;  . LEFT HEART CATH AND CORS/GRAFTS ANGIOGRAPHY N/A 10/21/2018   Procedure: LEFT HEART CATH AND CORS/GRAFTS ANGIOGRAPHY;  Surgeon: Troy Sine, MD;  Location: [place] [facility];  Service: Cardiovascular;  Laterality: N/A;  . PENILE PROSTHESIS IMPLANT    . RIGHT HEART CATHETERIZATION N/A 11/14/2013   Procedure: RIGHT HEART CATH;  Surgeon: Larey Dresser, MD;  Location: [facility] C[facility] LAB;  Service: Cardiovascular;  Laterality: N/A;  . SHOULDER OPEN ROTATOR CUFF REPAIR Right 2003     Current Outpatient Medications  Medication Sig Dispense Refill  . apixaban (ELIQUIS) 5 MG TABS tablet Take 1 tablet (5 mg total) by mouth 2 (two) times daily. 60 tablet 5  . Ascorbic Acid 500 MG CAPS Take 500 mg by mouth daily.    Marland Kitchen atorvastatin (LIPITOR) 80 MG tablet Take 1 tablet (80 mg total) by mouth daily. 30 tablet 6  . cetirizine (ZYRTEC) 10 MG tablet Take 10 mg by mouth daily.      . cholecalciferol (VITAMIN D) 1000 UNITS tablet Take 1,000 Units by mouth daily.      . furosemide (LASIX) 40 MG tablet Take 1 tablet (40 mg total) by mouth daily.    . isosorbide mononitrate (IMDUR) 30 MG 24 hr tablet Take 2 tablets (60 mg total) by mouth daily.    Marland Kitchen levothyroxine (SYNTHROID, LEVOTHROID) 50 MCG tablet Take 50 mcg by mouth daily.    . Multiple Vitamins-Minerals (CENTRUM SILVER PO) Take 1 tablet by mouth daily.     Marland Kitchen oxyCODONE-acetaminophen  (PERCOCET/ROXICET) 5-325 MG tablet Take 1 tablet by mouth every 6 (six) hours as needed for moderate pain. 14 tablet 0  . pantoprazole (PROTONIX) 40 MG tablet Take 40 mg by mouth daily.     . polyethylene glycol (MIRALAX / GLYCOLAX) 17 g packet Take 17 g by mouth daily as needed for mild constipation. 14 each 0  . tamsulosin (FLOMAX) 0.4 MG CAPS capsule Take 0.4 mg by mouth daily after supper.      No current facility-administered medications for this encounter.     Allergies:   Ancef [cefazolin sodium] and Vancomycin   Social History:  The patient  reports that he quit smoking about 34 years ago. His smoking use included cigarettes. He has a 35.00 pack-year smoking history. He has never used smokeless tobacco. He reports current alcohol use of about 2.0 standard drinks of alcohol per week. He reports that he does not  use drugs.   Family History:  The patient's family history includes Coronary artery disease in his mother; Heart disease in his mother; Hypertension in his mother.   ROS:  Please see the history of present illness.   All other systems are personally reviewed and negative.  Vitals:   01/26/19 0950  BP: 128/79  SpO2: 93%   Wt Readings from Last 3 Encounters:  01/26/19 93.9 kg (207 lb)  01/14/19 92 kg (202 lb 14.4 oz)  11/09/18 99 kg (218 lb 3.2 oz)     Exam: Tele Health Call; Exam is subjective General:  Speaks slowly in full sentences.  Lungs: Sounds a little short of breath. Normal respiratory effort with conversation.  Abdomen: Non-distended per patient report Extremities: Pt denies edema. Neuro: Alert & oriented x 3.   Recent Labs: 11/22/2018: TSH 6.979 01/09/2019: B Natriuretic Peptide 239.2 01/10/2019: ALT 12 01/13/2019: BUN 23; Creatinine, Ser 1.36; Hemoglobin 14.7; Magnesium 1.8; Platelets 162; Potassium 3.9; Sodium 138  Personally reviewed   Wt Readings from Last 3 Encounters:  01/14/19 92 kg (202 lb 14.4 oz)  11/09/18 99 kg (218 lb 3.2 oz)  11/01/18 101.5  kg (223 lb 12.8 oz)      ASSESSMENT AND PLAN:  .1 CAD: s/p CABG 3/12. NSTEMI 1/20, LHC showed new occlusion of the branch of SVG-ramus and OM that touched down on OM. There were also serial 70% stenoses in the mid/distal RCA. FFR of RCA was negative. No interventional target.  - No s/s ischemia. - No ASA given Eliquis use.  - Continue Imdur 60 mg daily.  - Continue atorvastatin 80 daily.  -Offranolazine withprolongedQTc.  2. Hyperlipidemia: Goal LDL <70.  - On atorvastatin 80 daily, good lipids in 3/20.  3. Chronic diastolic CHF: Echo in 9/47 with EF 55-60% and normal RV. - NYHA III-IV suspect this is in the setting of pneumothorax. He does not sound volume oveloaded.  - Continue Lasix 40 mg daily.  4. Hypotension with presyncope:  SBP 80-126  Resolved SBP running 80s-90s at times. He is lightheaded with standing.  HR is around 50.   5. Carotid stenosis: Repeat carotid dopplers in 9/20, moderate stenosis on prior studies.  6. AAA: Repeat AAA [place] in 5/20. 7. Aortic stenosis: Mild on last echo. 8. Atrial fibrillation: Paroxysmal.He is significantly symptomatic while in atrial fibrillation.  HR 50 today and he says that pulse feels regular.  Suspect NSR.  -Continueamiodarone 200 mg daily. TSH has been mildly elevated with normal free T3/T4.  Check LFTs today.  He will need a regular eye exam.  - Offranolazine as above given long QT. - Continue Eliquis 5 mg bid.Denies bleeding. 9. Left Pneumothorax, Required chest tube which was removed on 4/24. CXR was PCP on 5/1 concerning for recurrence of pneumo. Given increasing SOB I am going to try and him in with pulmonary sooner.   COVID screen The patient does not have any symptoms that suggest any further testing/ screening at this time.  Social distancing reinforced today.  Patient Risk: After full review of this patients clinical status, I feel that they are at moderate risk for cardiac decompensation at this time.   Relevant cardiac medications were reviewed at length with the patient today. The patient does not have concerns regarding their medications at this time.   The following changes were made today:  None  Recommended follow-up:  Follow up with Dr Aundra Dubin in 8 weeks.  Today, I have spent  25 minutes with the patient with  telehealth technology discussing the above issues .    Jeanmarie Hubert, NP  01/26/2019 9:42 AM  [facility] [facility] [address] [phone] (office) [phone] (fax)

## 2019-01-26 NOTE — Telephone Encounter (Signed)
MW did have a 4:30 opening 5/12. Called and spoke with pt to see if he would like to come in at that time on 5/12 instead of the current scheduled appt on 5/18 and pt stated he would take that appt. Pt's appt has been moved up to 5/12 with MW at 4:30.  Called and spoke with Nira Conn at [facility] letting her know that pt's appt has been moved up sooner to 5/12 and she expressed understanding. Nothing further needed.

## 2019-01-26 NOTE — Telephone Encounter (Signed)
Reviewed AVS:  Follow up with Dr Aundra Dubin in 8 weeks. /8 July @11 :40, relayed gate code as well  Let him know I have contacted Pulmonary to move up his appointment. They are going to call back and I will call him as soon as they call back. /completed

## 2019-01-27 ENCOUNTER — Encounter (HOSPITAL_COMMUNITY): Payer: Medicare Other

## 2019-01-27 ENCOUNTER — Other Ambulatory Visit (HOSPITAL_COMMUNITY): Payer: Self-pay | Admitting: Cardiology

## 2019-01-27 DIAGNOSIS — I6523 Occlusion and stenosis of bilateral carotid arteries: Secondary | ICD-10-CM

## 2019-01-27 NOTE — Addendum Note (Signed)
Encounter addended by: Scarlette Calico, RN on: 01/27/2019 4:50 PM  Actions taken: Order list changed, Diagnosis association updated

## 2019-02-01 ENCOUNTER — Ambulatory Visit (INDEPENDENT_AMBULATORY_CARE_PROVIDER_SITE_OTHER): Payer: Medicare Other

## 2019-02-01 ENCOUNTER — Encounter: Payer: Self-pay | Admitting: Internal Medicine

## 2019-02-01 ENCOUNTER — Ambulatory Visit (INDEPENDENT_AMBULATORY_CARE_PROVIDER_SITE_OTHER): Payer: Medicare Other | Admitting: Internal Medicine

## 2019-02-01 ENCOUNTER — Other Ambulatory Visit: Payer: Self-pay

## 2019-02-01 VITALS — BP 118/74 | HR 80 | Temp 97.5°F | Ht 75.5 in | Wt 205.0 lb

## 2019-02-01 DIAGNOSIS — J90 Pleural effusion, not elsewhere classified: Secondary | ICD-10-CM

## 2019-02-01 DIAGNOSIS — I6523 Occlusion and stenosis of bilateral carotid arteries: Secondary | ICD-10-CM | POA: Diagnosis not present

## 2019-02-01 DIAGNOSIS — J9811 Atelectasis: Secondary | ICD-10-CM | POA: Diagnosis not present

## 2019-02-01 NOTE — Progress Notes (Signed)
Eric Gordon, male    DOB: [birth year],     MRN: [id number]   Brief patient profile:  85 yowm quit smoking 1985 with hbp/ caf/cri /diastolic dysfunction /maint on eliquis with acute onset L CP 01/09/19 admit >>>    Admit date: 01/09/2019 Discharge date: 01/14/2019  Admitted From: Home Disposition: Home  Recommendations for Outpatient Follow-up:  1. Follow up with PCP in 1 week  2. Patient will need repeat chest x-ray as an outpatient in the next 1 or 2 weeks 3. Outpatient follow-up with pulmonary if needed if symptoms do not improve 4. Outpatient follow-up with cardiology 5. Follow up in ED if symptoms worsen or new appear   Home Health: No Equipment/Devices: None  Discharge Condition: Stable CODE STATUS: Full Diet recommendation: Heart healthy  Brief/Interim Summary: 85 year old male with history of hypertension, hyperlipidemia, CAD status post CABG, A. fib on Eliquis, chronic kidney disease stage III, OSA on CPAP, GERD, hypothyroidism presented on 01/09/2019 with sudden onset left-sided chest pain. He was found to have spontaneous left hydropneumothorax for which chest tube was placed by ED provider. PCCM was consulted.  During the hospitalization, his condition gradually improved.  Pneumothorax gradually improved.  Chest tube has been removed today by PCCM.  If repeat chest x-ray this afternoon is stable, he will be discharged home as PCCM has cleared him for discharge.   Discharge Diagnoses:  Principal Problem:   Pneumothorax, left Active Problems:   Essential hypertension   Asthma, mild intermittent   Abdominal aortic aneurysm (HCC)   CAD (coronary artery disease)   Atrial fibrillation (HCC)   CKD (chronic kidney disease), stage III (HCC)   Chronic diastolic CHF (congestive heart failure) (HCC)   Hypothyroidism   Pleural effusion   Pneumothorax on left  Spontaneous left hydropneumothorax with left pleural effusion -Treated with chest tube.PCCM  following and managing chest tube.Chest x-ray showing improvement.  Chest tube has been removed today by PCCM.  If repeat chest x-ray this afternoon is stable, patient can be discharged home as per PCCM.  he will need a repeat chest x-ray in the next 1 or 2 weeks by his primary care provider.  Outpatient follow-up with PCCM if needed. -Pleural fluid was exudative with eosinophilic predominance. Etiology unclear. Cytology negative. PCCM concerned about use of amiodarone and probable lung toxicity.  Amiodarone discontinued on 01/13/2019.  Patient is currently not on Plavix as an outpatient.  CAD status post CABG Chronic diastolic CHF Paroxysmal A. fib -Rate controlled. Currently compensated and not in acute heart failure. Continue statin, Lasix and Eliquis. Got in touch with Dr. Aundra Dubin who follows the patient as an outpatient and he is okay for amiodarone to be discontinued.Amiodarone was discontinued on 01/13/2019. -Outpatient follow-up with cardiology.  Chronic kidney disease stage III -Creatinine stable on admission. Monitor  OSA -On CPAP at home.   Hypothyroidism -Continue Synthroid  GERD Continue Protonix     History of Present Illness  02/01/2019  Pulmonary/ 1st office eval/ f/u L hydroptx with eos features = 5150 with 70% eos and bnp 239  Vs 0.6 Eos peripherally  Chief Complaint  Patient presents with  . Pulmonary Consult    Self referral. Pt c/o increased SOB and left CP x 3 wks.   Dyspnea:  Indolent onset since May 1 assoc with L ant cp worse with deep breath/ no radiation Cough: none Sleep: bed is flat. Sleeps on back on one inhaler/ no cpap / no either  SABA use: none  No obvious  day to day or daytime variability or assoc excess/ purulent sputum or mucus plugs or hemoptysis  or chest tightness, subjective wheeze or overt sinus or hb symptoms.   Sleeping as above without nocturnal  or early am exacerbation  of respiratory  c/o's or need for noct saba.  Also denies any obvious fluctuation of symptoms with weather or environmental changes or other aggravating or alleviating factors except as outlined above   No unusual exposure hx or h/o childhood pna/ asthma or knowledge of premature birth.  Current Allergies, Complete Past Medical History, Past Surgical History, Family History, and Social History were reviewed in [employer] record.  ROS  The following are not active complaints unless bolded Hoarseness, sore throat, dysphagia, dental problems, itching, sneezing,  nasal congestion or discharge of excess mucus or purulent secretions, ear ache,   fever, chills, sweats, unintended wt loss or wt gain, classically  exertional cp,  orthopnea pnd or arm/hand swelling  or leg swelling, presyncope, palpitations, abdominal pain, anorexia, nausea, vomiting, diarrhea  or change in bowel habits or change in bladder habits, change in stools or change in urine, dysuria, hematuria,  rash, arthralgias, visual complaints, headache, numbness, weakness or ataxia or problems with walking or coordination,  change in mood or  memory.           Past Medical History:  Diagnosis Date  . Abdominal aortic aneurysm (AAA) ([place])   . Arthritis    "my whole body" (03/19/2018)  . CAD (coronary artery disease)   . Chronic anticoagulation    on coumadin for PE  . Chronic lower back pain   . GERD (gastroesophageal reflux disease)   . High cholesterol   . Hypertension   . Hypothyroidism   . OSA on CPAP   . Pneumonia    "now and once before" (03/19/2018)  . Pulmonary embolism [facility])    April 2012 after CABG  . S/P CABG (coronary artery bypass graft)        Outpatient Medications Prior to Visit  Medication Sig Dispense Refill  . apixaban (ELIQUIS) 5 MG TABS tablet Take 1 tablet (5 mg total) by mouth 2 (two) times daily. 60 tablet 5  . atorvastatin (LIPITOR) 80 MG tablet Take 1 tablet (80 mg total) by mouth daily. 30 tablet 6  . furosemide (LASIX)  40 MG tablet Take 1 tablet (40 mg total) by mouth daily.    . isosorbide mononitrate (IMDUR) 30 MG 24 hr tablet Take 2 tablets (60 mg total) by mouth daily.    Marland Kitchen levothyroxine (SYNTHROID, LEVOTHROID) 50 MCG tablet Take 50 mcg by mouth daily.    . Multiple Vitamins-Minerals (CENTRUM SILVER PO) Take 1 tablet by mouth daily.     Marland Kitchen oxyCODONE-acetaminophen (PERCOCET/ROXICET) 5-325 MG tablet Take 1 tablet by mouth every 6 (six) hours as needed for moderate pain. 14 tablet 0  . pantoprazole (PROTONIX) 40 MG tablet Take 40 mg by mouth daily.     . polyethylene glycol (MIRALAX / GLYCOLAX) 17 g packet Take 17 g by mouth daily as needed for mild constipation. 14 each 0  . tamsulosin (FLOMAX) 0.4 MG CAPS capsule Take 0.4 mg by mouth daily after supper.     . Ascorbic Acid 500 MG CAPS Take 500 mg by mouth daily.    . cetirizine (ZYRTEC) 10 MG tablet Take 10 mg by mouth daily.      . cholecalciferol (VITAMIN D) 1000 UNITS tablet Take 1,000 Units by mouth daily.  Objective:     BP 118/74 (BP Location: Left Arm, Cuff Size: Normal)   Pulse 80   Temp (!) 97.5 F (36.4 C) (Oral)   Ht 6' 3.5" (1.918 m)   Wt 205 lb (93 kg)   SpO2 98%   BMI 25.28 kg/m   SpO2: 98 %  RA     Tall thin wm nad  HEENT: nl dentition, turbinates bilaterally, and oropharynx. Nl external ear canals without cough reflex   NECK :  without JVD/Nodes/TM/ nl carotid upstrokes bilaterally   LUNGS: no acc muscle use,  Nl contour chest with decreased bs with dullness one/fourth on L / chest tube wound clean and dry s erythema.  CV:  slt IRIR   no s3 or murmur or increase in P2, and no edema   ABD:  soft and nontender with nl inspiratory excursion in the supine position. No bruits or organomegaly appreciated, bowel sounds nl  MS:  Nl gait/ ext warm without deformities, calf tenderness, cyanosis or clubbing No obvious joint restrictions   SKIN: warm and dry without lesions    NEURO:  alert, approp, nl sensorium with   no motor or cerebellar deficits apparent.      CXR PA and Lateral:   02/01/2019 :    I personally reviewed images and agree with radiology impression as follows:   Small/moderate L pl effusion s ptx   Lab Results  Component Value Date   WBC 8.3 01/13/2019   HGB 14.7 01/13/2019   HCT 45.5 01/13/2019   MCV 95.4 01/13/2019   PLT 162 01/13/2019       EOS                                                               0.8                                    01/13/19         Assessment   Pleural effusion on left L chest tube 01/09/19  Exudative, 70% eos > d/c'd amiodarone 01/14/19 - 02/01/2019 recurrent x 1/4 L chest   Unfortunately the presence of eosinophilia is not diagnostic of any one entity and can be seen with many types of pleural effusions and also with pneumothorax, which was the initial presentation here.  At this point the pneumothorax however is resolved and he is reaccumulating fluid in the left pleural space for unknown reasons so thoracentesis is indicated at this point especially since he is symptomatic again.  Discussed in detail all the  indications, usual  risks and alternatives  relative to the benefits with patient who agrees to proceed with w/u as outlined.      >>> rec hold 2 or 3 doses of eliquis and proceed with u/s guided L tcentesis    Will see again in 2 weeks to regroup p studies available, sooner if needed    I had an extended discussion with the patient reviewing all relevant studies completed to date and  lasting 25 minutes of a 40  minute post hosp f/u office visit with pt new to me addressing non-specific but potentially very serious refractory respiratory symptoms of uncertain and potentially multiple  etiologies.   Each maintenance medication was reviewed in detail including most importantly the difference between maintenance and prns and under what circumstances the prns are to be triggered using an action plan format that is not reflected in the  computer generated alphabetically organized AVS.    Please see AVS for specific instructions unique to this office visit that I personally wrote and verbalized to the the pt in detail and then reviewed with pt  by my nurse highlighting any changes in therapy/plan of care  recommended at today's visit.         Christinia Gully, MD 02/01/2019

## 2019-02-01 NOTE — Patient Instructions (Addendum)
Please remember to go to the  x-ray department  for your tests - we will call you with the results when they are available     Hold eliquis and I will call tomorrow with instructions  Add:  Try to get t centesis done by pm 5/14

## 2019-02-02 ENCOUNTER — Telehealth: Payer: Self-pay | Admitting: Internal Medicine

## 2019-02-02 ENCOUNTER — Inpatient Hospital Stay (HOSPITAL_COMMUNITY): Admission: RE | Admit: 2019-02-02 | Payer: Medicare Other | Source: Ambulatory Visit

## 2019-02-02 ENCOUNTER — Telehealth: Payer: Self-pay | Admitting: *Deleted

## 2019-02-02 ENCOUNTER — Encounter: Payer: Self-pay | Admitting: Internal Medicine

## 2019-02-02 ENCOUNTER — Other Ambulatory Visit: Payer: Medicare Other

## 2019-02-02 DIAGNOSIS — Z20822 Contact with and (suspected) exposure to covid-19: Secondary | ICD-10-CM

## 2019-02-02 DIAGNOSIS — R6889 Other general symptoms and signs: Secondary | ICD-10-CM | POA: Diagnosis not present

## 2019-02-02 NOTE — Telephone Encounter (Signed)
Received call from [facility], 'Eric Gordon.' Covid-19 testing requested. Order placed, Dr. Melvyn Novas ordering. Pt scheduled for 1115 today. Instructions for testing site process provided; pt verbalizes understanding.

## 2019-02-02 NOTE — Assessment & Plan Note (Addendum)
L chest tube 01/09/19  Exudative, 70% eos > d/c'd amiodarone 01/14/19 - 02/01/2019 recurrent x 1/4 L chest   Unfortunately the presence of eosinophilia is not diagnostic of any one entity and can be seen with many types of pleural effusions and also with pneumothorax, which was the initial presentation here.  At this point the pneumothorax however is resolved and he is reaccumulating fluid in the left pleural space for unknown reasons so thoracentesis is indicated at this point especially since he is symptomatic again.  Discussed in detail all the  indications, usual  risks and alternatives  relative to the benefits with patient who agrees to proceed with w/u as outlined.      >>> rec hold 2 or 3 doses of eliquis and proceed with u/s guided L tcentesis    Will see again in 2 weeks to regroup p studies available, sooner if needed    I had an extended discussion with the patient reviewing all relevant studies completed to date and  lasting 25 minutes of a 40  minute post hosp f/u office visit with pt new to me addressing non-specific but potentially very serious refractory respiratory symptoms of uncertain and potentially multiple  etiologies.   Each maintenance medication was reviewed in detail including most importantly the difference between maintenance and prns and under what circumstances the prns are to be triggered using an action plan format that is not reflected in the computer generated alphabetically organized AVS.    Please see AVS for specific instructions unique to this office visit that I personally wrote and verbalized to the the pt in detail and then reviewed with pt  by my nurse highlighting any changes in therapy/plan of care  recommended at today's visit.

## 2019-02-02 NOTE — Telephone Encounter (Signed)
Checked chart to see if there was any messages: Pt has completed covid testing He is aware of cxr results/us thora appt 5/14 with arrival of 12:45. Nothing further needed.

## 2019-02-02 NOTE — Telephone Encounter (Signed)
Also checked with MW to see if he had contacted patient. He says No.

## 2019-02-03 ENCOUNTER — Other Ambulatory Visit: Payer: Self-pay | Admitting: Radiology

## 2019-02-03 ENCOUNTER — Ambulatory Visit (HOSPITAL_COMMUNITY): Admission: RE | Admit: 2019-02-03 | Payer: No Typology Code available for payment source | Source: Ambulatory Visit

## 2019-02-04 ENCOUNTER — Other Ambulatory Visit (HOSPITAL_COMMUNITY): Payer: Self-pay | Admitting: Radiology

## 2019-02-04 ENCOUNTER — Telehealth (HOSPITAL_COMMUNITY): Payer: Self-pay | Admitting: *Deleted

## 2019-02-04 ENCOUNTER — Ambulatory Visit (HOSPITAL_COMMUNITY)
Admission: RE | Admit: 2019-02-04 | Discharge: 2019-02-04 | Disposition: A | Discharge status: Home / Self Care | Payer: Medicare Other | Source: Ambulatory Visit | Attending: Internal Medicine | Admitting: Internal Medicine

## 2019-02-04 ENCOUNTER — Encounter (HOSPITAL_COMMUNITY): Payer: Self-pay | Admitting: Radiology

## 2019-02-04 ENCOUNTER — Other Ambulatory Visit: Payer: Self-pay

## 2019-02-04 ENCOUNTER — Ambulatory Visit (HOSPITAL_COMMUNITY)
Admission: RE | Admit: 2019-02-04 | Discharge: 2019-02-04 | Disposition: A | Discharge status: Home / Self Care | Payer: Medicare Other | Source: Ambulatory Visit | Attending: Radiology | Admitting: Radiology

## 2019-02-04 DIAGNOSIS — R091 Pleurisy: Secondary | ICD-10-CM | POA: Diagnosis not present

## 2019-02-04 DIAGNOSIS — Z9889 Other specified postprocedural states: Secondary | ICD-10-CM

## 2019-02-04 DIAGNOSIS — J90 Pleural effusion, not elsewhere classified: Secondary | ICD-10-CM | POA: Diagnosis not present

## 2019-02-04 HISTORY — PX: IR THORACENTESIS ASP PLEURAL SPACE W/IMG GUIDE: IMG5380

## 2019-02-04 LAB — LACTATE DEHYDROGENASE, PLEURAL OR PERITONEAL FLUID: LD, Fluid: 177 U/L — ABNORMAL HIGH (ref 3–23)

## 2019-02-04 LAB — PROTEIN, PLEURAL OR PERITONEAL FLUID: Total protein, fluid: 4.3 g/dL

## 2019-02-04 LAB — GRAM STAIN

## 2019-02-04 LAB — BODY FLUID CELL COUNT WITH DIFFERENTIAL
Eos, Fluid: 24 %
Lymphs, Fluid: 30 %
Monocyte-Macrophage-Serous Fluid: 45 % — ABNORMAL LOW (ref 50–90)
Neutrophil Count, Fluid: 1 % (ref 0–25)
Total Nucleated Cell Count, Fluid: 1650 cu mm — ABNORMAL HIGH (ref 0–1000)

## 2019-02-04 MED ORDER — LIDOCAINE HCL 1 % IJ SOLN
INTRAMUSCULAR | Status: DC | PRN
  Administered 2019-02-04: 10 mL

## 2019-02-04 MED ORDER — LIDOCAINE HCL 1 % IJ SOLN
INTRAMUSCULAR | Status: AC
  Filled 2019-02-04: qty 20

## 2019-02-04 NOTE — Procedures (Signed)
PROCEDURE SUMMARY:  Successful US guided left thoracentesis. Yielded 1 L of clear yellow fluid. Pt tolerated procedure well. No immediate complications.  Specimen was sent for labs. CXR ordered.  EBL < 5 mL  Ascencion Dike PA-C 02/04/2019 11:42 AM

## 2019-02-05 LAB — TRIGLYCERIDES, BODY FLUIDS: Triglycerides, Fluid: 16 mg/dL

## 2019-02-07 ENCOUNTER — Institutional Professional Consult (permissible substitution): Payer: Medicare Other | Admitting: Internal Medicine

## 2019-02-07 ENCOUNTER — Telehealth (HOSPITAL_COMMUNITY): Payer: Self-pay | Admitting: *Deleted

## 2019-02-07 LAB — NOVEL CORONAVIRUS, NAA: SARS-CoV-2, NAA: NOT DETECTED

## 2019-02-07 NOTE — Telephone Encounter (Signed)
Opened in error. Eric Gordon, BSN Cardiac and Training and development officer

## 2019-02-07 NOTE — Progress Notes (Signed)
Spoke with pt and notified of results per Dr. Wert. Pt verbalized understanding and denied any questions. 

## 2019-02-08 LAB — CHOLESTEROL, BODY FLUID: Cholesterol, Fluid: 68 mg/dL

## 2019-02-09 LAB — CULTURE, BODY FLUID W GRAM STAIN -BOTTLE: Culture: NO GROWTH

## 2019-02-18 ENCOUNTER — Other Ambulatory Visit: Payer: Self-pay

## 2019-02-18 ENCOUNTER — Ambulatory Visit (HOSPITAL_COMMUNITY)
Admission: RE | Admit: 2019-02-18 | Discharge: 2019-02-18 | Disposition: A | Discharge status: Home / Self Care | Payer: Medicare Other | Source: Ambulatory Visit | Attending: Cardiology | Admitting: Cardiology

## 2019-02-18 DIAGNOSIS — I480 Paroxysmal atrial fibrillation: Secondary | ICD-10-CM

## 2019-02-20 NOTE — Progress Notes (Signed)
@Patient  ID: Eric Gordon, male    DOB: 1931/08/15, 85 y.o.   MRN: [id number]  Chief Complaint  Patient presents with  . Follow-up    f/up thoracentsis - SOB with activity but better     Referring provider: Shon Baton, MD  HPI:  85 year old male former smoker initially referred to our office on 02/01/2019 for management of left pleural effusion.  Patient was admitted in April/2020 for left pleural effusion.  This required a chest tube.  Pleural fluid exudative with high count of eosinophils.  Effusion is thought to be related to amiodarone.  Amiodarone was stopped on 01/13/2019.  Patient was discharged on 01/14/2019.  PMH: hypertension, hyperlipidemia, CAD status post CABG, A. fib on Eliquis, chronic kidney disease stage III, OSA on CPAP, GERD, hypothyroidism Smoker/ Smoking History: Former smoker.  Quit 1985.  35-pack-year smoking history. Maintenance:  none Pt of: Dr. Melvyn Novas   02/21/2019  - Visit   85 year old male former smoker followed in our office for left pleural effusion.  Patient initially consulted in April/2020 with a left exudative with high eosinophil count pleural effusion.    01/09/2019-left thoracentesis at bedside Total protein-4.4 LDH-206 body fluid cell count-WBCs 5150, eosinophils 70% Pathology smear review-increased eosinophils Gram stain-WBCs present, no organisms seen Culture-gram-positive cocci, aerobic bottle only LDH-122  Patient then was consulted with our office on 5/12/202 in the outpatient setting for further follow-up for left pleural effusion.  Chest x-ray revealed that left pleural effusion had reaccumulated.  Patient then had an outpatient thoracentesis.  02/04/2019- ultrasound-guided left thoracentesis- 1 L of clear fluid was removed Body fluid culture-no growth for 5 days Protein-4.3 Cholesterol-68 Gram stain- rare WBC, no organisms seen Body fluid cell count with differential- WBCs 1650, eosinophils 24%, monocytes 45% Triglycerides-16  LDH-77 Cytology-no malignant cells identified, mixed inflammation  Patient reports that after completing the outpatient thoracentesis he has had good days and bad days.  He reports that his breathing has been up and down.  He continues to have fatigue, shortness of breath.  Patient reports that shortness of breath has worsened over the past week.  Patient denies orthopnea.  Patient does admit that he is starting to have left chest wall pain which she had on admission to the hospital.  Patient associates his left chest wall pain to recurrence of his pleural effusion.  Chest x-ray today confirms that.  02/21/2019-chest x-ray- fairly small left pleural effusion, slightly larger than on study from 2 weeks ago prior with associated left base atelectasis/consolidation     Tests:  01/09/2019-chest x-ray-moderate to large left pneumothorax but no mediastinal shift, superimposed left pleural effusion and/or lung base consolidation  01/09/2019-left thoracentesis at bedside Total protein-4.4 LDH-206 body fluid cell count-WBCs 5150, eosinophils 70% Pathology smear review-increased eosinophils Gram stain-WBCs present, no organisms seen Culture-gram-positive cocci, aerobic bottle only LDH-122  01/11/2019-chest x-ray-left-sided pneumothorax is significantly larger compared to prior exam, left-sided chest tube is unchanged in position  02/01/2019-chest x-ray-stable moderate to large left effusion, improving left base atelectasis  02/04/2019-chest x-ray-decreased left pleural effusion, no pneumothorax  02/04/2019- ultrasound-guided left thoracentesis- 1 L of clear fluid was removed Body fluid culture-no growth for 5 days Protein-4.3 Cholesterol-68 Gram stain- rare WBC, no organisms seen Body fluid cell count with differential- WBCs 1650, eosinophils 24%, monocytes 45% Triglycerides-16 LDH-77 Cytology-no malignant cells identified, mixed inflammation  02/21/2019-chest x-ray- fairly small left pleural  effusion, slightly larger than on study from 2 weeks ago prior with associated left base atelectasis/consolidation  10/19/2018-echocardiogram- LV ejection fraction  55 to 60% 1. The left ventricle appears to be moderately increased in size, has normal wall thickness 55-60% ejection fraction Spectral Doppler shows pseudonormal pattern of diastolic filling.  2. Inferolateral wall not well visualized in all views but appears slightly hypokinetic as compared to prior.  3. Right ventricular systolic pressure is is mildly elevated.  4. The right ventricle has normal size and normal systolic function.  5. Mildly dilated left atrial size.  6. Mildly dilated right atrial size.  7. Mitral valve regurgitation is trivial by color flow Doppler.  8. Moderate mitral annular calcification.  9. The mitral valve normal in structure and function. 10. Normal tricuspid valve. 11. Tricuspid regurgitation mild-moderate. 12. Aortic valve normal. 13. Mild stenosis of the aortic valve. 14. There is moderate calcification of the aortic valve. 15. There is mild thickening of the aortic valve. 16. Pulmonic valve regurgitation is mild to moderate is mild by color flow Doppler. 17. The inferior vena cava was dilated in size with <50% respiratory variablity. 18. No atrial level shunt detected by color flow Doppler.  02/02/2019- SARS-CoV-2-not detected  SIX MIN WALK 02/21/2019 11/23/2012  Supplimental Oxygen during Test? (L/min) No No  Tech Comments: steady walking pace/ no desaturation. Mild SOB second lap and was able to finish walk. Joella Prince RN pt tolerated well///ldc 11/23/12     FENO:  No results found for: NITRICOXIDE  PFT: No flowsheet data found.  Imaging: Dg Chest 2 View  Result Date: 02/21/2019 CLINICAL DATA:  Pleural effusion EXAM: CHEST - 2 VIEW COMPARISON:  Feb 04, 2019. FINDINGS: There is a fairly small left effusion which appears slightly larger compared to most recent study. There is consolidation  as well in the left base. Lungs elsewhere are clear. Heart size and pulmonary vascularity are normal. No adenopathy. Patient is status post coronary artery bypass grafting. There is aortic atherosclerosis. There is degenerative change in each shoulder as well as in the thoracic spine. IMPRESSION: Fairly small left pleural effusion, slightly larger than on study from 2 weeks prior with associated left base atelectasis/consolidation. Lungs elsewhere clear. Heart size normal. Postoperative changes noted. Aortic Atherosclerosis (ICD10-I70.0). Electronically Signed   By: Lowella Grip III M.D.   On: 02/21/2019 13:58   Dg Chest 2 View  Result Date: 02/02/2019 CLINICAL DATA:  Left pleural effusion, follow-up EXAM: CHEST - 2 VIEW COMPARISON:  01/14/2019 FINDINGS: Moderate to large left effusion again noted, stable. Minimal left base atelectasis, improved. Right lung clear. Heart is normal size. Prior CABG. No acute bony abnormality. IMPRESSION: Stable moderate to large left effusion. Improving left base atelectasis. Electronically Signed   By: Rolm Baptise M.D.   On: 02/02/2019 09:25   Dg Chest Port 1 View  Result Date: 02/04/2019 CLINICAL DATA:  Post LEFT-sided thoracentesis. No complaints. EXAM: PORTABLE CHEST 1 VIEW COMPARISON:  Chest radiograph 02/01/2019 FINDINGS: Unchanged cardiomediastinal silhouette. Decreased LEFT pleural effusion. No visible pneumothorax. Mild subsegmental atelectasis at the LEFT base. IMPRESSION: Decreased LEFT pleural effusion. No pneumothorax. Electronically Signed   By: Staci Righter M.D.   On: 02/04/2019 11:56   Ir Thoracentesis Asp Pleural Space W/img Guide  Result Date: 02/04/2019 INDICATION: Shortness of breath. Left-sided pleural effusion. Request for diagnostic therapeutic thoracentesis. EXAM: ULTRASOUND GUIDED LEFT THORACENTESIS MEDICATIONS: None. COMPLICATIONS: None immediate. PROCEDURE: An ultrasound guided thoracentesis was thoroughly discussed with the patient and  questions answered. The benefits, risks, alternatives and complications were also discussed. The patient understands and wishes to proceed with the procedure. Written consent was obtained.  Ultrasound was performed to localize and mark an adequate pocket of fluid in the left chest. The area was then prepped and draped in the normal sterile fashion. 1% Lidocaine was used for local anesthesia. Under ultrasound guidance a 6 Fr Safe-T-Centesis catheter was introduced. Thoracentesis was performed. The catheter was removed and a dressing applied. FINDINGS: A total of approximately 1 L of clear yellow fluid was removed. Samples were sent to the laboratory as requested by the clinical team. IMPRESSION: Successful ultrasound guided left thoracentesis yielding 1 L of pleural fluid. Read by: Ascencion Dike PA-C Electronically Signed   By: Sandi Mariscal M.D.   On: 02/04/2019 11:59      Specialty Problems      Pulmonary Problems   HIATAL HERNIA    Qualifier: Diagnosis of  By: Theda Sers CMA, Anderson Malta        OBSTRUCTIVE SLEEP APNEA           DYSPNEA           Asthma, mild intermittent            CAP (community acquired pneumonia)   Pleural effusion on left    L chest tube 01/09/19  Exudative, 70% eos > d/c'd amiodarone 01/14/19 - 02/01/2019 recurrent x 1/4 L chest  - 02/03/19  Repeat L T centesis x 1 liter,  Wbc 1650 exudative,  45 % eos,  cytology >>> mixed inflammation       Pneumothorax, left   Pneumothorax on left      Allergies  Allergen Reactions  . Ancef [Cefazolin Sodium] Hives  . Vancomycin Rash    Immunization History  Administered Date(s) Administered  . Influenza Split 05/26/2012, 06/22/2013  . Influenza Whole 06/22/2009, 06/22/2010  . Influenza,inj,Quad PF,6+ Mos 05/23/2014  . Influenza,inj,quad, With Preservative 06/22/2017  . Pneumococcal Polysaccharide-23 09/23/2003    Past Medical History:  Diagnosis Date  . Abdominal aortic aneurysm (AAA) ([place])   . Arthritis     "my whole body" (03/19/2018)  . CAD (coronary artery disease)   . Chronic anticoagulation    on coumadin for PE  . Chronic lower back pain   . GERD (gastroesophageal reflux disease)   . High cholesterol   . Hypertension   . Hypothyroidism   . OSA on CPAP   . Pneumonia    "now and once before" (03/19/2018)  . Pulmonary embolism [facility])    April 2012 after CABG  . S/P CABG (coronary artery bypass graft)     Tobacco History: Social History   Tobacco Use  Smoking Status Former Smoker  . Packs/day: 1.00  . Years: 35.00  . Pack years: 35.00  . Types: Cigarettes  . Last attempt to quit: 02/18/1984  . Years since quitting: 35.0  Smokeless Tobacco Never Used   Counseling given: Yes   Continue to not smoke  Outpatient Encounter Medications as of 02/21/2019  Medication Sig  . apixaban (ELIQUIS) 5 MG TABS tablet Take 1 tablet (5 mg total) by mouth 2 (two) times daily.  . Ascorbic Acid 500 MG CAPS Take 500 mg by mouth daily.  Marland Kitchen atorvastatin (LIPITOR) 80 MG tablet Take 1 tablet (80 mg total) by mouth daily.  . cetirizine (ZYRTEC) 10 MG tablet Take 10 mg by mouth daily.    . cholecalciferol (VITAMIN D) 1000 UNITS tablet Take 1,000 Units by mouth daily.    . furosemide (LASIX) 40 MG tablet Take 1 tablet (40 mg total) by mouth daily.  . isosorbide mononitrate (IMDUR) 30 MG 24 hr  tablet Take 2 tablets (60 mg total) by mouth daily.  Marland Kitchen levothyroxine (SYNTHROID, LEVOTHROID) 50 MCG tablet Take 50 mcg by mouth daily.  . Multiple Vitamins-Minerals (CENTRUM SILVER PO) Take 1 tablet by mouth daily.   Marland Kitchen oxyCODONE-acetaminophen (PERCOCET/ROXICET) 5-325 MG tablet Take 1 tablet by mouth every 6 (six) hours as needed for moderate pain.  . pantoprazole (PROTONIX) 40 MG tablet Take 40 mg by mouth daily.   . polyethylene glycol (MIRALAX / GLYCOLAX) 17 g packet Take 17 g by mouth daily as needed for mild constipation.  . tamsulosin (FLOMAX) 0.4 MG CAPS capsule Take 0.4 mg by mouth daily after supper.    No  facility-administered encounter medications on file as of 02/21/2019.      Review of Systems  Review of Systems  Constitutional: Positive for fatigue. Negative for activity change, chills, fever and unexpected weight change.  HENT: Negative for congestion, rhinorrhea, sinus pressure, sinus pain, sneezing and sore throat.   Eyes: Negative.   Respiratory: Positive for shortness of breath (worsening). Negative for cough and wheezing.        Denies orthopnea  Left chest wall pain   Cardiovascular: Negative for chest pain and palpitations.  Gastrointestinal: Negative for diarrhea, nausea and vomiting.  Endocrine: Negative.   Musculoskeletal: Negative.   Skin: Negative.   Neurological: Negative for dizziness and headaches.  Psychiatric/Behavioral: Negative.  Negative for dysphoric mood. The patient is not nervous/anxious.   All other systems reviewed and are negative.    Physical Exam  BP 128/82   Pulse 80   Temp 97.8 F (36.6 C)   Ht 6\' 3"  (1.905 m)   Wt 205 lb (93 kg)   SpO2 96%   BMI 25.62 kg/m   Wt Readings from Last 5 Encounters:  02/21/19 205 lb (93 kg)  02/01/19 205 lb (93 kg)  01/26/19 207 lb (93.9 kg)  01/14/19 202 lb 14.4 oz (92 kg)  11/09/18 218 lb 3.2 oz (99 kg)    Physical Exam  Constitutional: He is oriented to person, place, and time and well-developed, well-nourished, and in no distress. No distress.  HENT:  Head: Normocephalic and atraumatic.  Right Ear: Hearing, tympanic membrane, external ear and ear canal normal.  Left Ear: Hearing, tympanic membrane, external ear and ear canal normal.  Mouth/Throat: Uvula is midline and oropharynx is clear and moist. No oropharyngeal exudate.  Eyes: Pupils are equal, round, and reactive to light.  Neck: Normal range of motion. Neck supple.  Cardiovascular: Normal rate, regular rhythm and normal heart sounds.  Pulmonary/Chest: Effort normal. No accessory muscle usage. No respiratory distress. He has decreased breath  sounds (decreased breathsounds in left base ). He has no wheezes. He has no rhonchi.  Abdominal: Soft. Bowel sounds are normal. There is no abdominal tenderness.  Musculoskeletal: Normal range of motion.        General: No edema.  Lymphadenopathy:    He has no cervical adenopathy.  Neurological: He is alert and oriented to person, place, and time. Gait normal.  Skin: Skin is warm and dry. He is not diaphoretic. No erythema.  Psychiatric: Mood, memory, affect and judgment normal.  Nursing note and vitals reviewed.     Lab Results:  CBC    Component Value Date/Time   WBC 8.3 01/13/2019 0308   RBC 4.77 01/13/2019 0308   HGB 14.7 01/13/2019 0308   HCT 45.5 01/13/2019 0308   PLT 162 01/13/2019 0308   MCV 95.4 01/13/2019 0308   MCH 30.8 01/13/2019  0308   MCHC 32.3 01/13/2019 0308   RDW 14.3 01/13/2019 0308   LYMPHSABS 1.4 01/13/2019 0308   MONOABS 0.9 01/13/2019 0308   EOSABS 0.8 (H) 01/13/2019 0308   BASOSABS 0.0 01/13/2019 0308    BMET    Component Value Date/Time   NA 138 01/13/2019 0308   K 3.9 01/13/2019 0308   CL 98 01/13/2019 0308   CO2 29 01/13/2019 0308   GLUCOSE 108 (H) 01/13/2019 0308   BUN 23 01/13/2019 0308   CREATININE 1.36 (H) 01/13/2019 0308   CALCIUM 8.8 (L) 01/13/2019 0308   GFRNONAA 47 (L) 01/13/2019 0308   GFRAA 55 (L) 01/13/2019 0308    BNP    Component Value Date/Time   BNP 239.2 (H) 01/09/2019 1555   BNP 81.4 06/24/2011 1657    ProBNP    Component Value Date/Time   PROBNP 71.0 11/10/2013 0800      Assessment & Plan:   Pleural effusion on left Assessment: Chest x-ray today reveals reaccumulated left pleural effusion Previous peripheral eosinophilia Improved exudative effusions on last thoracentesis Patient reporting increased shortness of breath Patient reporting left chest wall pain Breath sounds today are clear to auscultation with the exception of diminished in left base Tolerated walk in office today   Plan: We will  coordinate outpatient thoracentesis Patient to hold Eliquis for 48 hours prior to thoracentesis Patient to hold Eliquis for 24 hours after thoracentesis before restarting Follow-up with our office in 3 weeks with a chest x-ray Referral to cardiothoracic surgery for chronic management of recurrent pleural effusion Lab work today    Return in about 3 weeks (around 03/14/2019), or if symptoms worsen or fail to improve, for Follow up with Wyn Quaker FNP-C, Follow up with Dr. Melvyn Novas.   Lauraine Rinne, NP 02/21/2019   This appointment was 30 minutes long with over 50% of the time in direct face-to-face patient care, assessment, plan of care, and follow-up.

## 2019-02-21 ENCOUNTER — Other Ambulatory Visit: Payer: Self-pay | Admitting: Pulmonary Disease

## 2019-02-21 ENCOUNTER — Encounter: Payer: Self-pay | Admitting: Pulmonary Disease

## 2019-02-21 ENCOUNTER — Ambulatory Visit (INDEPENDENT_AMBULATORY_CARE_PROVIDER_SITE_OTHER): Payer: Medicare Other | Admitting: Pulmonary Disease

## 2019-02-21 ENCOUNTER — Ambulatory Visit (INDEPENDENT_AMBULATORY_CARE_PROVIDER_SITE_OTHER): Payer: Medicare Other

## 2019-02-21 ENCOUNTER — Other Ambulatory Visit: Payer: Self-pay

## 2019-02-21 VITALS — BP 128/82 | HR 80 | Temp 97.8°F | Ht 75.0 in | Wt 205.0 lb

## 2019-02-21 DIAGNOSIS — J90 Pleural effusion, not elsewhere classified: Secondary | ICD-10-CM

## 2019-02-21 LAB — CBC WITH DIFFERENTIAL/PLATELET
Basophils Absolute: 0.1 10*3/uL (ref 0.0–0.1)
Basophils Relative: 0.8 % (ref 0.0–3.0)
Eosinophils Absolute: 0.4 10*3/uL (ref 0.0–0.7)
Eosinophils Relative: 4.7 % (ref 0.0–5.0)
HCT: 43 % (ref 39.0–52.0)
Hemoglobin: 14.6 g/dL (ref 13.0–17.0)
Lymphocytes Relative: 20.9 % (ref 12.0–46.0)
Lymphs Abs: 1.6 10*3/uL (ref 0.7–4.0)
MCHC: 33.9 g/dL (ref 30.0–36.0)
MCV: 94.8 fl (ref 78.0–100.0)
Monocytes Absolute: 0.7 10*3/uL (ref 0.1–1.0)
Monocytes Relative: 9.2 % (ref 3.0–12.0)
Neutro Abs: 4.9 10*3/uL (ref 1.4–7.7)
Neutrophils Relative %: 64.4 % (ref 43.0–77.0)
Platelets: 219 10*3/uL (ref 150.0–400.0)
RBC: 4.54 Mil/uL (ref 4.22–5.81)
RDW: 15.3 % (ref 11.5–15.5)
WBC: 7.7 10*3/uL (ref 4.0–10.5)

## 2019-02-21 LAB — SEDIMENTATION RATE: Sed Rate: 17 mm/hr (ref 0–20)

## 2019-02-21 NOTE — Progress Notes (Signed)
See above

## 2019-02-21 NOTE — Progress Notes (Signed)
Lab work today is improving.  Still showing elevated eosinophils but this is improved significantly since last blood draw in the hospital.  Sed rate today which is a marker for inflammation is normal.  Continue forward with outpatient thoracentesis.  No other changes in plan of care  It is a pleasure getting to know you and taking care of you today,  Wyn Quaker FNP

## 2019-02-21 NOTE — Progress Notes (Signed)
Discussed results with patient office.  We will proceed forward with outpatient thoracentesis.  Patient will follow-up with our office in 3 weeks with a chest x-ray.  We will also do a referral to cardiothoracic for chronic management.  Wyn Quaker FNP

## 2019-02-21 NOTE — Patient Instructions (Addendum)
We will coordinate an outpatient thoracentesis  I will have you start holding her Eliquis at this time until we can figure out when your next date is for your thoracentesis hopefully this will be completed within the next 48 hours  Restart your eliquis 24 hours after the thoracentesis   We also refer you to cardiothoracic surgery for further evaluation  Our office will be in close contact with you as we were to get this scheduled  Lab work today in office  Return in about 2 weeks (around 03/07/2019), or if symptoms worsen or fail to improve, for Follow up with Wyn Quaker FNP-C, Follow up with Dr. Melvyn Novas.   Coronavirus (COVID-19) Are you at risk?  Are you at risk for the Coronavirus (COVID-19)?  To be considered HIGH RISK for Coronavirus (COVID-19), you have to meet the following criteria:  . Traveled to [place], [place], [place], [place] or [place]; or in the [place] to [place], [place], [place], or [place]; and have fever, cough, and shortness of breath within the last 2 weeks of travel OR . Been in close contact with a person diagnosed with COVID-19 within the last 2 weeks and have fever, cough, and shortness of breath . IF YOU DO NOT MEET THESE CRITERIA, YOU ARE CONSIDERED LOW RISK FOR COVID-19.  What to do if you are HIGH RISK for COVID-19?  Marland Kitchen If you are having a medical emergency, call 911. . Seek medical care right away. Before you go to a doctor's office, urgent care or emergency department, call ahead and tell them about your recent travel, contact with someone diagnosed with COVID-19, and your symptoms. You should receive instructions from your physician's office regarding next steps of care.  . When you arrive at healthcare provider, tell the healthcare staff immediately you have returned from visiting [place], [place], [place], [place] or [place]; or traveled in the [place] to [place], [place], [place], or [place]; in the last two weeks or you have been in  close contact with a person diagnosed with COVID-19 in the last 2 weeks.   . Tell the health care staff about your symptoms: fever, cough and shortness of breath. . After you have been seen by a medical provider, you will be either: o Tested for (COVID-19) and discharged home on quarantine except to seek medical care if symptoms worsen, and asked to  - Stay home and avoid contact with others until you get your results (4-5 days)  - Avoid travel on public transportation if possible (such as bus, train, or airplane) or o Sent to the Emergency Department by EMS for evaluation, COVID-19 testing, and possible admission depending on your condition and test results.  What to do if you are LOW RISK for COVID-19?  Reduce your risk of any infection by using the same precautions used for avoiding the common cold or flu:  Marland Kitchen Wash your hands often with soap and warm water for at least 20 seconds.  If soap and water are not readily available, use an alcohol-based hand sanitizer with at least 60% alcohol.  . If coughing or sneezing, cover your mouth and nose by coughing or sneezing into the elbow areas of your shirt or coat, into a tissue or into your sleeve (not your hands). . Avoid shaking hands with others and consider head nods or verbal greetings only. . Avoid touching your eyes, nose, or mouth with unwashed hands.  . Avoid close contact with people who are  sick. . Avoid places or events with large numbers of people in one location, like concerts or sporting events. . Carefully consider travel plans you have or are making. . If you are planning any travel outside or inside the [place], visit the CDC's Travelers' Health webpage for the latest health notices. . If you have some symptoms but not all symptoms, continue to monitor at home and seek medical attention if your symptoms worsen. . If you are having a medical emergency, call 911.   [place] /  e-Visit: [email or web address]         [facility]: [facility]: [phone]                   [facility]: [phone]           It is flu season:   >>> Best ways to protect herself from the flu: Receive the yearly flu vaccine, practice good hand hygiene washing with soap and also using hand sanitizer when available, eat a nutritious meals, get adequate rest, hydrate appropriately   Please contact the office if your symptoms worsen or you have concerns that you are not improving.   Thank you for choosing [facility] for your healthcare, and for allowing [place] to partner with you on your healthcare journey. I am thankful to be able to provide care to you today.   Wyn Quaker FNP-C

## 2019-02-21 NOTE — Assessment & Plan Note (Addendum)
Assessment: Chest x-ray today reveals reaccumulated left pleural effusion Previous peripheral eosinophilia Improved exudative effusions on last thoracentesis Patient reporting increased shortness of breath Patient reporting left chest wall pain Breath sounds today are clear to auscultation with the exception of diminished in left base Tolerated walk in office today   Plan: We will coordinate outpatient thoracentesis Patient to hold Eliquis for 48 hours prior to thoracentesis Patient to hold Eliquis for 24 hours after thoracentesis before restarting Follow-up with our office in 3 weeks with a chest x-ray Referral to cardiothoracic surgery for chronic management of recurrent pleural effusion Lab work today

## 2019-02-22 ENCOUNTER — Telehealth: Payer: Self-pay

## 2019-02-22 NOTE — Progress Notes (Signed)
Chart and office note reviewed in detail  > agree with a/p as outlined    

## 2019-02-22 NOTE — Telephone Encounter (Signed)
Attempted to call pt but line went straight to VM. Left message for pt to return call. 

## 2019-02-23 ENCOUNTER — Other Ambulatory Visit (HOSPITAL_COMMUNITY)
Admission: RE | Admit: 2019-02-23 | Discharge: 2019-02-23 | Disposition: A | Discharge status: Home / Self Care | Payer: Medicare Other | Source: Ambulatory Visit | Attending: Pulmonary Disease | Admitting: Pulmonary Disease

## 2019-02-23 ENCOUNTER — Other Ambulatory Visit: Payer: Self-pay | Admitting: Pulmonary Disease

## 2019-02-23 ENCOUNTER — Other Ambulatory Visit (HOSPITAL_COMMUNITY): Payer: Medicare Other

## 2019-02-23 DIAGNOSIS — Z1159 Encounter for screening for other viral diseases: Secondary | ICD-10-CM | POA: Diagnosis not present

## 2019-02-23 LAB — SARS CORONAVIRUS 2 BY RT PCR (HOSPITAL ORDER, PERFORMED IN ~~LOC~~ HOSPITAL LAB): SARS Coronavirus 2: NEGATIVE

## 2019-02-23 NOTE — Progress Notes (Signed)
Test is negative. Proceed forward with planned thoracentesis.   Wyn Quaker FNP

## 2019-02-23 NOTE — Telephone Encounter (Signed)
Returned call to patient He was given lab results. Pt scheduled to have thoracentesis and needs covid testing. He has been directed to [facility]. Nothing further needed.

## 2019-02-23 NOTE — Telephone Encounter (Signed)
Pt is calling back [phone]

## 2019-02-24 ENCOUNTER — Ambulatory Visit (HOSPITAL_COMMUNITY)
Admission: RE | Admit: 2019-02-24 | Discharge: 2019-02-24 | Disposition: A | Discharge status: Home / Self Care | Payer: Medicare Other | Source: Ambulatory Visit | Attending: Pulmonary Disease | Admitting: Pulmonary Disease

## 2019-02-24 ENCOUNTER — Ambulatory Visit (HOSPITAL_COMMUNITY)
Admission: RE | Admit: 2019-02-24 | Discharge: 2019-02-24 | Disposition: A | Discharge status: Home / Self Care | Payer: Medicare Other | Source: Ambulatory Visit | Attending: Student | Admitting: Student

## 2019-02-24 ENCOUNTER — Encounter (HOSPITAL_COMMUNITY): Payer: Self-pay | Admitting: Student

## 2019-02-24 ENCOUNTER — Other Ambulatory Visit: Payer: Self-pay

## 2019-02-24 DIAGNOSIS — Z9889 Other specified postprocedural states: Secondary | ICD-10-CM

## 2019-02-24 DIAGNOSIS — J90 Pleural effusion, not elsewhere classified: Secondary | ICD-10-CM | POA: Diagnosis not present

## 2019-02-24 DIAGNOSIS — J948 Other specified pleural conditions: Secondary | ICD-10-CM | POA: Diagnosis not present

## 2019-02-24 DIAGNOSIS — R918 Other nonspecific abnormal finding of lung field: Secondary | ICD-10-CM | POA: Insufficient documentation

## 2019-02-24 DIAGNOSIS — I700 Atherosclerosis of aorta: Secondary | ICD-10-CM | POA: Diagnosis not present

## 2019-02-24 HISTORY — PX: IR THORACENTESIS ASP PLEURAL SPACE W/IMG GUIDE: IMG5380

## 2019-02-24 LAB — GLUCOSE, PLEURAL OR PERITONEAL FLUID: Glucose, Fluid: 98 mg/dL

## 2019-02-24 LAB — BODY FLUID CELL COUNT WITH DIFFERENTIAL
Eos, Fluid: 22 %
Lymphs, Fluid: 72 %
Monocyte-Macrophage-Serous Fluid: 6 % — ABNORMAL LOW (ref 50–90)
Neutrophil Count, Fluid: 0 % (ref 0–25)
Total Nucleated Cell Count, Fluid: 1580 cu mm — ABNORMAL HIGH (ref 0–1000)

## 2019-02-24 LAB — ALBUMIN, PLEURAL OR PERITONEAL FLUID: Albumin, Fluid: 2.8 g/dL

## 2019-02-24 LAB — LACTATE DEHYDROGENASE, PLEURAL OR PERITONEAL FLUID: LD, Fluid: 152 U/L — ABNORMAL HIGH (ref 3–23)

## 2019-02-24 LAB — GRAM STAIN

## 2019-02-24 LAB — AMYLASE, PLEURAL OR PERITONEAL FLUID: Amylase, Fluid: 23 U/L

## 2019-02-24 LAB — PROTEIN, PLEURAL OR PERITONEAL FLUID: Total protein, fluid: 4.2 g/dL

## 2019-02-24 MED ORDER — LIDOCAINE HCL (PF) 1 % IJ SOLN
INTRAMUSCULAR | Status: DC | PRN
  Administered 2019-02-24: 10 mL

## 2019-02-24 MED ORDER — LIDOCAINE HCL 1 % IJ SOLN
INTRAMUSCULAR | Status: AC
  Filled 2019-02-24: qty 20

## 2019-02-24 NOTE — Procedures (Signed)
PROCEDURE SUMMARY:  Successful image-guided left thoracentesis. Yielded 1.3 liters of clear gold fluid. Patient tolerated procedure well. No immediate complications. EBL = 5 mL.  Specimen was sent for labs. CXR ordered.  Claris Pong Louk PA-C 02/24/2019 1:58 PM

## 2019-02-24 NOTE — Progress Notes (Signed)
FYI Beatryce Colom[place] 

## 2019-02-25 LAB — TRIGLYCERIDES, BODY FLUIDS: Triglycerides, Fluid: 23 mg/dL

## 2019-02-25 LAB — PH, BODY FLUID: pH, Body Fluid: 7.7

## 2019-02-27 LAB — CHOLESTEROL, BODY FLUID: Cholesterol, Fluid: 71 mg/dL

## 2019-02-28 NOTE — Progress Notes (Signed)
Pleural fluid results have come back.  They are very similar to the same results that we received 3 weeks ago.  Eosinophil count is still trending down.  Cytology work-up is negative for any malignant cells.  Keep scheduled follow-up with Dr. Melvyn Novas as well as upcoming appointment with cardiothoracic surgery.  Wyn Quaker, FNP

## 2019-03-03 ENCOUNTER — Encounter: Payer: Medicare Other | Admitting: Cardiothoracic Surgery

## 2019-03-08 ENCOUNTER — Encounter: Payer: Self-pay | Admitting: Internal Medicine

## 2019-03-08 ENCOUNTER — Ambulatory Visit (INDEPENDENT_AMBULATORY_CARE_PROVIDER_SITE_OTHER): Payer: Medicare Other | Admitting: Internal Medicine

## 2019-03-08 ENCOUNTER — Other Ambulatory Visit: Payer: Self-pay | Admitting: Cardiothoracic Surgery

## 2019-03-08 ENCOUNTER — Other Ambulatory Visit: Payer: Self-pay

## 2019-03-08 ENCOUNTER — Ambulatory Visit (INDEPENDENT_AMBULATORY_CARE_PROVIDER_SITE_OTHER): Payer: Medicare Other

## 2019-03-08 DIAGNOSIS — J90 Pleural effusion, not elsewhere classified: Secondary | ICD-10-CM

## 2019-03-08 DIAGNOSIS — J9811 Atelectasis: Secondary | ICD-10-CM | POA: Diagnosis not present

## 2019-03-08 DIAGNOSIS — I6523 Occlusion and stenosis of bilateral carotid arteries: Secondary | ICD-10-CM | POA: Diagnosis not present

## 2019-03-08 LAB — CBC WITH DIFFERENTIAL/PLATELET
Basophils Absolute: 0 10*3/uL (ref 0.0–0.1)
Basophils Relative: 0.7 % (ref 0.0–3.0)
Eosinophils Absolute: 0.3 10*3/uL (ref 0.0–0.7)
Eosinophils Relative: 4.6 % (ref 0.0–5.0)
HCT: 45.1 % (ref 39.0–52.0)
Hemoglobin: 14.8 g/dL (ref 13.0–17.0)
Lymphocytes Relative: 23.3 % (ref 12.0–46.0)
Lymphs Abs: 1.5 10*3/uL (ref 0.7–4.0)
MCHC: 32.9 g/dL (ref 30.0–36.0)
MCV: 95 fl (ref 78.0–100.0)
Monocytes Absolute: 0.6 10*3/uL (ref 0.1–1.0)
Monocytes Relative: 9.9 % (ref 3.0–12.0)
Neutro Abs: 4 10*3/uL (ref 1.4–7.7)
Neutrophils Relative %: 61.5 % (ref 43.0–77.0)
Platelets: 187 10*3/uL (ref 150.0–400.0)
RBC: 4.74 Mil/uL (ref 4.22–5.81)
RDW: 15.3 % (ref 11.5–15.5)
WBC: 6.5 10*3/uL (ref 4.0–10.5)

## 2019-03-08 LAB — SEDIMENTATION RATE: Sed Rate: 10 mm/hr (ref 0–20)

## 2019-03-08 NOTE — Patient Instructions (Addendum)
Please remember to go to the lab and x-ray department   for your tests - we will call you with the results when they are available.      Keep appt with Dr Servando Snare

## 2019-03-08 NOTE — Assessment & Plan Note (Addendum)
Abrupt  Onset L cp s prodrome  01/09/2019  L chest tube 01/09/19  Exudative, 70% eos > d/c'd amiodarone 01/14/19 - 02/01/2019 recurrent x 1/4 L chest  - 02/03/19  Repeat L T centesis x 1 liter,  Wbc 1650 exudative,  45 % eos,  cytology >>> mixed inflammation  - 02/24/2019   L tcentesis x 1.3 liters > exudative with less eos,  neg cyt - Quant Gold TB 03/08/2019 >>>      Most likely had enough pleuritis either from the ptx or chest tube to cause inflammatory effusion (PCIS pattern)  and could consider trial of prednisone and observation in this setting though not entirely clear to me what the cause of the effusion is and he has been referred to Dr Servando Snare for surgical opinion set up for 03/09/2019 so I gave him 3 options to consider  1) follow ? With trial of prednisone (probably only need 20 mg daily as peripheral eos and esr quite low now) 2) pleurex 3) VATS which would require likely cards clearance and off coumadin but would be the most definitive for dx / management  He will think about it and address these options with Dr Synthia Innocent input 03/09/2019    I had an extended discussion with the patient reviewing all relevant studies completed to date and  lasting 15 to 20 minutes of a 25 minute visit    See device teaching which extended face to face time for this visit.  Each maintenance medication was reviewed in detail including emphasizing most importantly the difference between maintenance and prns and under what circumstances the prns are to be triggered using an action plan format that is not reflected in the computer generated alphabetically organized AVS which I have not found useful in most complex patients, especially with respiratory illnesses  Please see AVS for specific instructions unique to this visit that I personally wrote and verbalized to the the pt in detail and then reviewed with pt  by my nurse highlighting any  changes in therapy recommended at today's visit to their plan of care.

## 2019-03-08 NOTE — Progress Notes (Signed)
Eric Gordon, male    DOB: 04-Oct-1930      MRN: [id number]   Brief patient profile:  73 yowm quit smoking 1985 with hbp/ caf/cri /diastolic dysfunction /maint on eliquis with acute onset L CP 01/09/19 admit >>>  hydroptx   Admit date: 01/09/2019 Discharge date: 01/14/2019  Brief/Interim Summary: 85 year old male with history of hypertension, hyperlipidemia, CAD status post CABG, A. fib on Eliquis, chronic kidney disease stage III, OSA on CPAP, GERD, hypothyroidism presented on 01/09/2019 with sudden onset left-sided chest pain. He was found to have spontaneous left hydropneumothorax for which chest tube was placed by ED provider. PCCM was consulted.  During the hospitalization, his condition gradually improved.  Pneumothorax gradually improved.  Chest tube has been removed today by PCCM.  If repeat chest x-ray this afternoon is stable, he will be discharged home as PCCM has cleared him for discharge.   Discharge Diagnoses:  Principal Problem:   Pneumothorax, left Active Problems:   Essential hypertension   Asthma, mild intermittent   Abdominal aortic aneurysm (HCC)   CAD (coronary artery disease)   Atrial fibrillation (HCC)   CKD (chronic kidney disease), stage III (HCC)   Chronic diastolic CHF (congestive heart failure) (HCC)   Hypothyroidism   Pleural effusion   Pneumothorax on left  Spontaneous left hydropneumothorax with left pleural effusion -Treated with chest tube.PCCM following and managing chest tube.Chest x-ray showing improvement.  Chest tube has been removed today by PCCM.  If repeat chest x-ray this afternoon is stable, patient can be discharged home as per PCCM.  he will need a repeat chest x-ray in the next 1 or 2 weeks by his primary care provider.  Outpatient follow-up with PCCM if needed. -Pleural fluid was exudative with eosinophilic predominance. Etiology unclear. Cytology negative. PCCM concerned about use of amiodarone and probable lung toxicity.   Amiodarone discontinued on 01/13/2019.  Patient is currently not on Plavix as an outpatient.  CAD status post CABG Chronic diastolic CHF Paroxysmal A. fib -Rate controlled. Currently compensated and not in acute heart failure. Continue statin, Lasix and Eliquis. Got in touch with Dr. Aundra Dubin who follows the patient as an outpatient and he is okay for amiodarone to be discontinued.Amiodarone was discontinued on 01/13/2019. -Outpatient follow-up with cardiology.  Chronic kidney disease stage III -Creatinine stable on admission. Monitor  OSA -On CPAP at home.   Hypothyroidism -Continue Synthroid  GERD Continue Protonix     History of Present Illness  02/01/2019  Pulmonary/ 1st office eval/[name] f/u L hydroptx with eos features = 5150 with 70% eos and bnp 239  Vs 0.6 Eos peripherally  Chief Complaint  Patient presents with  . Pulmonary Consult    Self referral. Pt c/o increased SOB and left CP x 3 wks.   Dyspnea:  Indolent onset since May 1 assoc with L ant cp worse with deep breath/ no radiation Cough: none Sleep: bed is flat. Sleeps on back on one inhaler/ no cpap / no either  SABA use: none rec - 02/03/19  Repeat L T centesis x 1 liter,  Wbc 1650 exudative,  45 % eos,  cytology >>> mixed inflammation  - 02/24/2019   L tcentesis x 1.3 liters > exudative with less eos neg cyt   03/08/2019  f/u ov/Eion Timbrook re:  Chief Complaint  Patient presents with  . Follow-up    Breathing is getting worse again. No new co's.    Dyspnea:  Gradually worse x sev weeks Cough: min dry  Sleeping: no  orthopnea SABA use: none 02:  None    No obvious day to day or daytime variability or assoc excess/ purulent sputum or mucus plugs or hemoptysis or cp or chest tightness, subjective wheeze or overt sinus or hb symptoms.   sleeping without nocturnal  or early am exacerbation  of respiratory  c/o's or need for noct saba. Also denies any obvious fluctuation of symptoms with weather or  environmental changes or other aggravating or alleviating factors except as outlined above   No unusual exposure hx or h/o childhood pna/ asthma or knowledge of premature birth.  Current Allergies, Complete Past Medical History, Past Surgical History, Family History, and Social History were reviewed in [employer] record.  ROS  The following are not active complaints unless bolded Hoarseness, sore throat, dysphagia, dental problems, itching, sneezing,  nasal congestion or discharge of excess mucus or purulent secretions, ear ache,   fever, chills, sweats, unintended wt loss or wt gain, classically pleuritic or exertional cp,  orthopnea pnd or arm/hand swelling  or leg swelling, presyncope, palpitations, abdominal pain, anorexia, nausea, vomiting, diarrhea  or change in bowel habits or change in bladder habits, change in stools or change in urine, dysuria, hematuria,  rash, arthralgias, visual complaints, headache, numbness, weakness or ataxia or problems with walking or coordination,  change in mood or  memory.        Current Meds  Medication Sig  . apixaban (ELIQUIS) 5 MG TABS tablet Take 1 tablet (5 mg total) by mouth 2 (two) times daily.  . Ascorbic Acid 500 MG CAPS Take 500 mg by mouth daily.  Marland Kitchen atorvastatin (LIPITOR) 80 MG tablet Take 1 tablet (80 mg total) by mouth daily.  . cetirizine (ZYRTEC) 10 MG tablet Take 10 mg by mouth daily.    . cholecalciferol (VITAMIN D) 1000 UNITS tablet Take 1,000 Units by mouth daily.    . furosemide (LASIX) 40 MG tablet Take 1 tablet (40 mg total) by mouth daily.  . isosorbide mononitrate (IMDUR) 30 MG 24 hr tablet Take 2 tablets (60 mg total) by mouth daily.  Marland Kitchen levothyroxine (SYNTHROID, LEVOTHROID) 50 MCG tablet Take 50 mcg by mouth daily.  . Multiple Vitamins-Minerals (CENTRUM SILVER PO) Take 1 tablet by mouth daily.   Marland Kitchen oxyCODONE-acetaminophen (PERCOCET/ROXICET) 5-325 MG tablet Take 1 tablet by mouth every 6 (six) hours as needed  for moderate pain.  . pantoprazole (PROTONIX) 40 MG tablet Take 40 mg by mouth daily.   . polyethylene glycol (MIRALAX / GLYCOLAX) 17 g packet Take 17 g by mouth daily as needed for mild constipation.  . tamsulosin (FLOMAX) 0.4 MG CAPS capsule Take 0.4 mg by mouth daily after supper.                 Past Medical History:  Diagnosis Date  . Abdominal aortic aneurysm (AAA) ([place])   . Arthritis    "my whole body" (03/19/2018)  . CAD (coronary artery disease)   . Chronic anticoagulation    on coumadin for PE  . Chronic lower back pain   . GERD (gastroesophageal reflux disease)   . High cholesterol   . Hypertension   . Hypothyroidism   . OSA on CPAP   . Pneumonia    "now and once before" (03/19/2018)  . Pulmonary embolism [facility])    April 2012 after CABG  . S/P CABG (coronary artery bypass graft)           Objective:     Wt Readings from  Last 3 Encounters:  03/08/19 203 lb 6.4 oz (92.3 kg)  02/21/19 205 lb (93 kg)  02/01/19 205 lb (93 kg)     Vital signs reviewed - Note on arrival 02 sats  96% on RA      Thin wm nad   HEENT: nl dentition, turbinates bilaterally, and oropharynx. Nl external ear canals without cough reflex   NECK :  without JVD/Nodes/TM/ nl carotid upstrokes bilaterally   LUNGS: no acc muscle use,  Nl contour chest  Decreased bs/ dullness L base  without cough on insp or exp maneuvers   CV:  RRR  no s3 or murmur or increase in P2, and no edema   ABD:  soft and nontender with nl inspiratory excursion in the supine position. No bruits or organomegaly appreciated, bowel sounds nl  MS:  Nl gait/ ext warm without deformities, calf tenderness, cyanosis or clubbing No obvious joint restrictions   SKIN: warm and dry without lesions    NEURO:  alert, approp, nl sensorium with  no motor or cerebellar deficits apparent.        CXR PA and Lateral:   03/08/2019 :    I personally reviewed images and agree with radiology impression as follows:     recurrent L effusion, slt less than last time req tap (< 25% now)    Lab Results  Component Value Date   WBC 6.5 03/08/2019   HGB 14.8 03/08/2019   HCT 45.1 03/08/2019   MCV 95.0 03/08/2019   PLT 187.0 03/08/2019       EOS                                                              0.3                                     03/08/2019     Lab Results  Component Value Date   ESRSEDRATE 10 03/08/2019   ESRSEDRATE 17 02/21/2019     Labs ordered 03/08/2019  Quant tb     Assessment

## 2019-03-09 ENCOUNTER — Encounter: Payer: Self-pay | Admitting: Internal Medicine

## 2019-03-09 ENCOUNTER — Institutional Professional Consult (permissible substitution) (INDEPENDENT_AMBULATORY_CARE_PROVIDER_SITE_OTHER): Payer: Medicare Other | Admitting: Cardiothoracic Surgery

## 2019-03-09 VITALS — BP 145/80 | HR 71 | Temp 97.7°F | Resp 20 | Ht 75.0 in | Wt 203.0 lb

## 2019-03-09 DIAGNOSIS — I6523 Occlusion and stenosis of bilateral carotid arteries: Secondary | ICD-10-CM | POA: Diagnosis not present

## 2019-03-09 DIAGNOSIS — Z951 Presence of aortocoronary bypass graft: Secondary | ICD-10-CM

## 2019-03-09 NOTE — Progress Notes (Signed)
ATC, line busy [facility]

## 2019-03-09 NOTE — Progress Notes (Signed)
[address]       [name] [address]             [phone]                    Zariah L Mikowski [facility] Medical Record #[id number] Date of Birth: 06/15/1931  Referring: Lauraine Rinne, NP Primary Care: Shon Baton, MD Primary Cardiologist: No primary care provider on file.  Chief Complaint:    Chief Complaint  Patient presents with  . Pleural Effusion    Surgical eval with CXR left Pleural effusion    History of Present Illness:    Eric Gordon 85 y.o. male is seen in the office  today for after recent hospitalization for spontaneous pneumothorax.  He was hospitalized in a left chest tube was placed by the pulmonary service with resolution of the pneumothorax but tube was removed and he was discharged home subsequently he has had a thoracentesis for recurrent left pleural effusion.  He is referred from the pulmonary office.  He denies fever chills or night sweats.  Previously he has never had a pneumothorax.  He did have coronary artery bypass grafting in 2012 with use of left internal mammary artery.     In January of this year he presented with non-STEMI myocardial infarction and underwent cardiac catheterization on 3 occasions over several day.  But ultimately with no intervention done  Current Activity/ Functional Status:  Patient is independent with mobility/ambulation, transfers, ADL's, IADL's.   Zubrod Score: At the time of surgery this patient's most appropriate activity status/level should be described as: []     0    Normal activity, no symptoms []     1    Restricted in physical strenuous activity but ambulatory, able to do out light work []     2    Ambulatory and capable of self care, unable to do work activities, up and about               >50 % of waking hours                              []     3    Only limited self care, in bed greater than 50% of waking hours []     4    Completely disabled, no self care, confined to bed or  chair []     5    Moribund   Past Medical History:  Diagnosis Date  . Abdominal aortic aneurysm (AAA) ([facility])   . Arthritis    "my whole body" (03/19/2018)  . CAD (coronary artery disease)   . Chronic anticoagulation    on coumadin for PE  . Chronic lower back pain   . GERD (gastroesophageal reflux disease)   . High cholesterol   . Hypertension   . Hypothyroidism   . OSA on CPAP   . Pneumonia    "now and once before" (03/19/2018)  . Pulmonary embolism [facility])    April 2012 after CABG  . S/P CABG (coronary artery bypass graft)     Past Surgical History:  Procedure Laterality Date  . CARDIAC CATHETERIZATION  11/2010  . CORONARY ANGIOGRAPHY N/A 10/22/2018   Procedure: CORONARY ANGIOGRAPHY;  Surgeon: Troy Sine, MD;  Location: [name];  Service: Cardiovascular;  Laterality: N/A;  . CORONARY ARTERY BYPASS GRAFT  March 2012   CABG X  5  . INGUINAL HERNIA REPAIR Left 1980  . INTRAVASCULAR PRESSURE WIRE/FFR STUDY N/A 10/22/2018   Procedure: INTRAVASCULAR PRESSURE WIRE/FFR STUDY;  Surgeon: Troy Sine, MD;  Location: [facility];  Service: Cardiovascular;  Laterality: N/A;  . IR THORACENTESIS ASP PLEURAL SPACE W/IMG GUIDE  02/04/2019  . IR THORACENTESIS ASP PLEURAL SPACE W/IMG GUIDE  02/24/2019  . KNEE ARTHROSCOPY Left 2006  . LEFT HEART CATH AND CORS/GRAFTS ANGIOGRAPHY N/A 10/18/2018   Procedure: LEFT HEART CATH AND CORS/GRAFTS ANGIOGRAPHY;  Surgeon: Lorretta Harp, MD;  Location: [facility];  Service: Cardiovascular;  Laterality: N/A;  . LEFT HEART CATH AND CORS/GRAFTS ANGIOGRAPHY N/A 10/21/2018   Procedure: LEFT HEART CATH AND CORS/GRAFTS ANGIOGRAPHY;  Surgeon: Troy Sine, MD;  Location: [facility];  Service: Cardiovascular;  Laterality: N/A;  . PENILE PROSTHESIS IMPLANT    . RIGHT HEART CATHETERIZATION N/A 11/14/2013   Procedure: RIGHT HEART CATH;  Surgeon: Larey Dresser, MD;  Location: [facility] C[facility];  Service: Cardiovascular;  Laterality:  N/A;  . SHOULDER OPEN ROTATOR CUFF REPAIR Right 2003    Family History  Problem Relation Age of Onset  . Coronary artery disease Mother   . Hypertension Mother   . Heart disease Mother      Social History   Tobacco Use  Smoking Status Former Smoker  . Packs/day: 1.00  . Years: 35.00  . Pack years: 35.00  . Types: Cigarettes  . Quit date: 02/18/1984  . Years since quitting: 35.0  Smokeless Tobacco Never Used    Social History   Substance and Sexual Activity  Alcohol Use Yes  . Alcohol/week: 2.0 standard drinks  . Types: 2 Standard drinks or equivalent per week     Allergies  Allergen Reactions  . Ancef [Cefazolin Sodium] Hives  . Vancomycin Rash    Current Outpatient Medications  Medication Sig Dispense Refill  . apixaban (ELIQUIS) 5 MG TABS tablet Take 1 tablet (5 mg total) by mouth 2 (two) times daily. 60 tablet 5  . Ascorbic Acid 500 MG CAPS Take 500 mg by mouth daily.    Marland Kitchen atorvastatin (LIPITOR) 80 MG tablet Take 1 tablet (80 mg total) by mouth daily. 30 tablet 6  . cetirizine (ZYRTEC) 10 MG tablet Take 10 mg by mouth daily.      . cholecalciferol (VITAMIN D) 1000 UNITS tablet Take 1,000 Units by mouth daily.      . furosemide (LASIX) 40 MG tablet Take 1 tablet (40 mg total) by mouth daily.    . isosorbide mononitrate (IMDUR) 30 MG 24 hr tablet Take 2 tablets (60 mg total) by mouth daily.    Marland Kitchen levothyroxine (SYNTHROID, LEVOTHROID) 50 MCG tablet Take 50 mcg by mouth daily.    . Multiple Vitamins-Minerals (CENTRUM SILVER PO) Take 1 tablet by mouth daily.     Marland Kitchen oxyCODONE-acetaminophen (PERCOCET/ROXICET) 5-325 MG tablet Take 1 tablet by mouth every 6 (six) hours as needed for moderate pain. 14 tablet 0  . pantoprazole (PROTONIX) 40 MG tablet Take 40 mg by mouth daily.     . polyethylene glycol (MIRALAX / GLYCOLAX) 17 g packet Take 17 g by mouth daily as needed for mild constipation. 14 each 0  . tamsulosin (FLOMAX) 0.4 MG CAPS capsule Take 0.4 mg by mouth daily  after supper.      No current facility-administered medications for this visit.       Review of Systems:     Cardiac Review of Systems: [  Y] = yes  or   [ N ] = no   Chest Pain Blue.Reese    ]  Resting SOB [ n  ] Exertional SOB  Blue.Reese  ]  Orthopnea Florencio.Farrier  ]   Pedal Edema [ n  ]    Palpitations [ n ] Syncope  [n  ]   Presyncope [ n  ]   General Review of Systems: [Y] = yes [  ]=no Constitional: recent weight change [  ];  Wt loss over the last 3 months [   ] anorexia [  ]; fatigue [  ]; nausea [  ]; night sweats [  ]; fever [  ]; or chills [  ];           Eye : blurred vision [  ]; diplopia [   ]; vision changes [  ];  Amaurosis fugax[  ]; Resp: cough [  ];  wheezing[  ];  hemoptysis[  ]; shortness of breath[  ]; paroxysmal nocturnal dyspnea[  ]; dyspnea on exertion[  ]; or orthopnea[  ];  GI:  gallstones[  ], vomiting[  ];  dysphagia[  ]; melena[  ];  hematochezia [  ]; heartburn[  ];   Hx of  Colonoscopy[  ]; GU: kidney stones [  ]; hematuria[  ];   dysuria [  ];  nocturia[  ];  history of     obstruction [  ]; urinary frequency [  ]             Skin: rash, swelling[  ];, hair loss[  ];  peripheral edema[  ];  or itching[  ]; Musculosketetal: myalgias[  ];  joint swelling[  ];  joint erythema[  ];  joint pain[  ];  back pain[  ];  Heme/Lymph: bruising[  ];  bleeding[  ];  anemia[  ];  Neuro: TIA[  ];  headaches[  ];  stroke[  ];  vertigo[  ];  seizures[  ];   paresthesias[  ];  difficulty walking[  ];  Psych:depression[  ]; anxiety[  ];  Endocrine: diabetes[  ];  thyroid dysfunction[  ];  Immunizations: Flu up to date [  ]; Pneumococcal up to date [  ];  Other:     PHYSICAL EXAMINATION: BP (!) 145/80   Pulse 71   Temp 97.7 F (36.5 C) (Skin)   Resp 20   Ht 6\' 3"  (1.905 m)   Wt 203 lb (92.1 kg)   SpO2 95% Comment: RA  BMI 25.37 kg/m  General appearance: alert, cooperative and no distress Neck: no adenopathy, no carotid bruit, no JVD, supple, symmetrical, trachea midline and thyroid not  enlarged, symmetric, no tenderness/mass/nodules Lymph nodes: Cervical, supraclavicular, and axillary nodes normal. Resp: clear to auscultation bilaterally Back: symmetric, no curvature. ROM normal. No CVA tenderness. Cardio: regular rate and rhythm, S1, S2 normal, no murmur, click, rub or gallop GI: soft, non-tender; bowel sounds normal; no masses,  no organomegaly Extremities: extremities normal, atraumatic, no cyanosis or edema and Homans sign is negative, no sign of DVT Neurologic: Grossly normal  Diagnostic Studies & Laboratory data:     Recent Radiology Findings:   Dg Chest 1 View  Result Date: 02/24/2019 CLINICAL DATA:  Status post left thoracentesis. EXAM: CHEST  1 VIEW COMPARISON:  Radiographs of February 21, 2019. FINDINGS: Stable cardiomediastinal silhouette. Status post coronary bypass graft. Atherosclerosis of thoracic aorta is noted. No pneumothorax is noted. Right lung is clear. Left pleural effusion  noted on prior exam has resolved status post thoracentesis. Minimal left basilar residual atelectasis or scarring remains. Bony thorax is unremarkable. IMPRESSION: Left pleural effusion noted on prior exam has resolved status post thoracentesis. Minimal left basilar residual atelectasis or scarring remains. No pneumothorax is noted. Aortic Atherosclerosis (ICD10-I70.0). Electronically Signed   By: Marijo Conception M.D.   On: 02/24/2019 14:09   Dg Chest 2 View  Result Date: 03/09/2019 CLINICAL DATA:  Left-sided recurrent pleural effusion. EXAM: CHEST - 2 VIEW COMPARISON:  02/24/2019. FINDINGS: Prior CABG. Heart size normal. Mild left base atelectasis. Small left-sided pleural effusion, new from prior exam. No pneumothorax. Degenerative change thoracic spine and both shoulders. IMPRESSION: 1.  Small left pleural effusion, new from prior exam. 2.  Mild left base atelectasis. 3.  Prior CABG.  Heart size stable. Electronically Signed   By: Marcello Moores  Register   On: 03/09/2019 08:33   Dg Chest 2 View   Result Date: 02/21/2019 CLINICAL DATA:  Pleural effusion EXAM: CHEST - 2 VIEW COMPARISON:  Feb 04, 2019. FINDINGS: There is a fairly small left effusion which appears slightly larger compared to most recent study. There is consolidation as well in the left base. Lungs elsewhere are clear. Heart size and pulmonary vascularity are normal. No adenopathy. Patient is status post coronary artery bypass grafting. There is aortic atherosclerosis. There is degenerative change in each shoulder as well as in the thoracic spine. IMPRESSION: Fairly small left pleural effusion, slightly larger than on study from 2 weeks prior with associated left base atelectasis/consolidation. Lungs elsewhere clear. Heart size normal. Postoperative changes noted. Aortic Atherosclerosis (ICD10-I70.0). Electronically Signed   By: Lowella Grip III M.D.   On: 02/21/2019 13:58   Ir Thoracentesis Asp Pleural Space W/img Guide  Result Date: 02/24/2019 INDICATION: Patient with history of dyspnea and recurrent left-sided pleural effusion. Request is made for diagnostic and therapeutic left thoracentesis. EXAM: ULTRASOUND GUIDED DIAGNOSTIC AND THERAPEUTIC LEFT THORACENTESIS MEDICATIONS: 10 mL 1% lidocaine COMPLICATIONS: None immediate. PROCEDURE: An ultrasound guided thoracentesis was thoroughly discussed with the patient and questions answered. The benefits, risks, alternatives and complications were also discussed. The patient understands and wishes to proceed with the procedure. Written consent was obtained. Ultrasound was performed to localize and mark an adequate pocket of fluid in the left chest. The area was then prepped and draped in the normal sterile fashion. 1% Lidocaine was used for local anesthesia. Under ultrasound guidance a 6 Fr Safe-T-Centesis catheter was introduced. Thoracentesis was performed. The catheter was removed and a dressing applied. FINDINGS: A total of approximately 1.3 L of clear gold fluid was removed. Samples were  sent to the laboratory as requested by the clinical team. IMPRESSION: Successful ultrasound guided left thoracentesis yielding 1.3 L of pleural fluid. Read by: Earley Abide, PA-C Electronically Signed   By: Jerilynn Mages.  Shick M.D.   On: 02/24/2019 14:14     I have independently reviewed the above radiology studies  and reviewed the findings with the patient.   Recent Lab Findings: Lab Results  Component Value Date   WBC 6.5 03/08/2019   HGB 14.8 03/08/2019   HCT 45.1 03/08/2019   PLT 187.0 03/08/2019   GLUCOSE 108 (H) 01/13/2019   CHOL 96 11/09/2018   TRIG 75 11/09/2018   HDL 29 (L) 11/09/2018   LDLCALC 52 11/09/2018   ALT 12 01/10/2019   AST 15 01/10/2019   NA 138 01/13/2019   K 3.9 01/13/2019   CL 98 01/13/2019   CREATININE 1.36 (H) 01/13/2019  BUN 23 01/13/2019   CO2 29 01/13/2019   TSH 6.979 (H) 11/22/2018   INR 1.16 10/21/2018   HGBA1C (H) 12/06/2010    5.8 (NOTE)                                                                       According to the ADA Clinical Practice Recommendations for 2011, when HbA1c is used as a screening test:   >=6.5%   Diagnostic of Diabetes Mellitus           (if abnormal result  is confirmed)  5.7-6.4%   Increased risk of developing Diabetes Mellitus  References:Diagnosis and Classification of Diabetes Mellitus,Diabetes OEUM,3536,14(ERXVQ 1):S62-S69 and Standards of Medical Care in         Diabetes - 2011,Diabetes Care,2011,34  (Suppl 1):S11-S61.      Assessment / Plan:   Small recurrent left pleural effusion, unknown etiology but persistent did not originally with left pneumothorax.   I discussed the treatment options with the patient including placement of Pleurx catheter observation or left video-assisted thoracoscopy with biopsy and talc pleurodesis.  At this point I recommended we obtain a CT scan now that the lung is fully inflated the previous CT scan was done at the time the patient had a pneumothorax to evaluate for any possible malignant  cause versus inflammatory.  With the patient's myocardial infarction less than 6 months ago with would like to avoid major interventions include involving general anesthesia.  CT of the chest will be performed I will see the patient back in approximately 1 week.   Grace Isaac MD      [address] [place] [address] Office [phone]   Beeper [phone]  03/10/2019 4:23 PM

## 2019-03-10 ENCOUNTER — Telehealth: Payer: Self-pay | Admitting: Internal Medicine

## 2019-03-10 ENCOUNTER — Other Ambulatory Visit: Payer: Self-pay | Admitting: *Deleted

## 2019-03-10 DIAGNOSIS — J90 Pleural effusion, not elsewhere classified: Secondary | ICD-10-CM

## 2019-03-10 LAB — QUANTIFERON-TB GOLD PLUS
Mitogen-NIL: 7.7 IU/mL
NIL: 0.02 IU/mL
QuantiFERON-TB Gold Plus: NEGATIVE
TB1-NIL: 0 IU/mL
TB2-NIL: 0 IU/mL

## 2019-03-10 NOTE — Telephone Encounter (Signed)
Called and spoke with Patient.  CXR results and recommendations given.  Understanding stated.  Nothing further at this time.

## 2019-03-10 NOTE — Progress Notes (Signed)
LMTCB

## 2019-03-14 ENCOUNTER — Other Ambulatory Visit: Payer: Self-pay

## 2019-03-14 ENCOUNTER — Ambulatory Visit
Admission: RE | Admit: 2019-03-14 | Discharge: 2019-03-14 | Disposition: A | Discharge status: Home / Self Care | Payer: Medicare Other | Source: Ambulatory Visit | Attending: Cardiothoracic Surgery | Admitting: Cardiothoracic Surgery

## 2019-03-14 DIAGNOSIS — J90 Pleural effusion, not elsewhere classified: Secondary | ICD-10-CM | POA: Diagnosis not present

## 2019-03-14 DIAGNOSIS — J439 Emphysema, unspecified: Secondary | ICD-10-CM | POA: Diagnosis not present

## 2019-03-15 ENCOUNTER — Other Ambulatory Visit: Payer: Self-pay

## 2019-03-16 ENCOUNTER — Other Ambulatory Visit: Payer: Self-pay | Admitting: *Deleted

## 2019-03-16 ENCOUNTER — Ambulatory Visit (INDEPENDENT_AMBULATORY_CARE_PROVIDER_SITE_OTHER): Payer: Medicare Other | Admitting: Cardiothoracic Surgery

## 2019-03-16 ENCOUNTER — Telehealth (HOSPITAL_COMMUNITY): Payer: Self-pay | Admitting: Cardiology

## 2019-03-16 ENCOUNTER — Encounter: Payer: Self-pay | Admitting: Cardiothoracic Surgery

## 2019-03-16 VITALS — BP 122/76 | HR 69 | Resp 18 | Ht 75.0 in | Wt 204.0 lb

## 2019-03-16 DIAGNOSIS — J90 Pleural effusion, not elsewhere classified: Secondary | ICD-10-CM

## 2019-03-16 DIAGNOSIS — I712 Thoracic aortic aneurysm, without rupture, unspecified: Secondary | ICD-10-CM

## 2019-03-16 DIAGNOSIS — I6523 Occlusion and stenosis of bilateral carotid arteries: Secondary | ICD-10-CM

## 2019-03-16 DIAGNOSIS — Z01810 Encounter for preprocedural cardiovascular examination: Secondary | ICD-10-CM

## 2019-03-16 DIAGNOSIS — Z951 Presence of aortocoronary bypass graft: Secondary | ICD-10-CM

## 2019-03-16 NOTE — Telephone Encounter (Signed)
° °  [facility] Pre-operative Risk Assessment    Request for surgical clearance:  1. What type of surgery is being performed? Left VATS  2. When is this surgery scheduled? TBD based on clearance by Dr. Aundra Dubin  3. What type of clearance is required (medical clearance vs. Pharmacy clearance to hold med vs. Both)? Medical  4. Are there any medications that need to be held prior to surgery and how long? no  5. Practice name and name of physician performing surgery? Dr. [name], [facility]  6. What is your office phone number: [phone]   7.   What is your office fax number: [phone]  8.   Anesthesia type (None, local, MAC, general) ?: general   Dr. Everrett Coombe office would like the patient to be seen earlier than his appointment on 07/08. The patient will also need an Echo to be cleared for surgery.   Johnna Acosta 03/16/2019, 4:59 PM  _________________________________________________________________   (provider comments below)

## 2019-03-16 NOTE — Progress Notes (Signed)
[address]       ,[place] [address]             [phone]                    Eric Gordon [facility] Medical Record #[id number] Date of Birth: 11-12-1930  Referring: Lauraine Rinne, NP Primary Care: Shon Baton, MD Primary Cardiologist: No primary care provider on file.  Chief Complaint:    Chief Complaint  Patient presents with  . Pleural Effusion    1 week f/u with Chest CT 03/14/19  . Thoracic Aortic Aneurysm    History of Present Illness:    Eric Gordon 85 y.o. male is seen in the office  today for after recent hospitalization for spontaneous pneumothorax.  He was hospitalized in a left chest tube was placed by the pulmonary service with resolution of the pneumothorax but tube was removed and he was discharged home subsequently he has had a thoracentesis for recurrent left pleural effusion.  He is referred from the pulmonary office.  He denies fever chills or night sweats.  Previously he has never had a pneumothorax.  He did have coronary artery bypass grafting in 2012 with use of left internal mammary artery.     In January of this year he presented with non-STEMI myocardial infarction and underwent cardiac catheterization on 3 occasions over several day.  But ultimately with no intervention done  Current Activity/ Functional Status:  Patient is independent with mobility/ambulation, transfers, ADL's, IADL's.   Zubrod Score: At the time of surgery this patient's most appropriate activity status/level should be described as: []     0    Normal activity, no symptoms [x]     1    Restricted in physical strenuous activity but ambulatory, able to do out light work []     2    Ambulatory and capable of self care, unable to do work activities, up and about               >50 % of waking hours                              []     3    Only limited self care, in bed greater than 50% of waking hours []     4    Completely disabled, no self care,  confined to bed or chair []     5    Moribund   Past Medical History:  Diagnosis Date  . Abdominal aortic aneurysm (AAA) ([place])   . Arthritis    "my whole body" (03/19/2018)  . CAD (coronary artery disease)   . Chronic anticoagulation    on coumadin for PE  . Chronic lower back pain   . GERD (gastroesophageal reflux disease)   . High cholesterol   . Hypertension   . Hypothyroidism   . OSA on CPAP   . Pneumonia    "now and once before" (03/19/2018)  . Pulmonary embolism [facility])    April 2012 after CABG  . S/P CABG (coronary artery bypass graft)     Past Surgical History:  Procedure Laterality Date  . CARDIAC CATHETERIZATION  11/2010  . CORONARY ANGIOGRAPHY N/A 10/22/2018   Procedure: CORONARY ANGIOGRAPHY;  Surgeon: Troy Sine, MD;  Location: [place] [facility] LAB;  Service: Cardiovascular;  Laterality: N/A;  . CORONARY ARTERY BYPASS GRAFT  March  2012   CABG X 5  . INGUINAL HERNIA REPAIR Left 1980  . INTRAVASCULAR PRESSURE WIRE/FFR STUDY N/A 10/22/2018   Procedure: INTRAVASCULAR PRESSURE WIRE/FFR STUDY;  Surgeon: Troy Sine, MD;  Location: [place] [facility];  Service: Cardiovascular;  Laterality: N/A;  . IR THORACENTESIS ASP PLEURAL SPACE W/IMG GUIDE  02/04/2019  . IR THORACENTESIS ASP PLEURAL SPACE W/IMG GUIDE  02/24/2019  . KNEE ARTHROSCOPY Left 2006  . LEFT HEART CATH AND CORS/GRAFTS ANGIOGRAPHY N/A 10/18/2018   Procedure: LEFT HEART CATH AND CORS/GRAFTS ANGIOGRAPHY;  Surgeon: Lorretta Harp, MD;  Location: [place] [facility];  Service: Cardiovascular;  Laterality: N/A;  . LEFT HEART CATH AND CORS/GRAFTS ANGIOGRAPHY N/A 10/21/2018   Procedure: LEFT HEART CATH AND CORS/GRAFTS ANGIOGRAPHY;  Surgeon: Troy Sine, MD;  Location: [place] [facility];  Service: Cardiovascular;  Laterality: N/A;  . PENILE PROSTHESIS IMPLANT    . RIGHT HEART CATHETERIZATION N/A 11/14/2013   Procedure: RIGHT HEART CATH;  Surgeon: Larey Dresser, MD;  Location: [facility] CATH LAB;  Service:  Cardiovascular;  Laterality: N/A;  . SHOULDER OPEN ROTATOR CUFF REPAIR Right 2003    Family History  Problem Relation Age of Onset  . Coronary artery disease Mother   . Hypertension Mother   . Heart disease Mother      Social History   Tobacco Use  Smoking Status Former Smoker  . Packs/day: 1.00  . Years: 35.00  . Pack years: 35.00  . Types: Cigarettes  . Quit date: 02/18/1984  . Years since quitting: 35.0  Smokeless Tobacco Never Used    Social History   Substance and Sexual Activity  Alcohol Use Yes  . Alcohol/week: 2.0 standard drinks  . Types: 2 Standard drinks or equivalent per week     Allergies  Allergen Reactions  . Ancef [Cefazolin Sodium] Hives  . Vancomycin Rash    Current Outpatient Medications  Medication Sig Dispense Refill  . apixaban (ELIQUIS) 5 MG TABS tablet Take 1 tablet (5 mg total) by mouth 2 (two) times daily. 60 tablet 5  . Ascorbic Acid 500 MG CAPS Take 500 mg by mouth daily.    Marland Kitchen atorvastatin (LIPITOR) 80 MG tablet Take 1 tablet (80 mg total) by mouth daily. 30 tablet 6  . cetirizine (ZYRTEC) 10 MG tablet Take 10 mg by mouth daily.      . cholecalciferol (VITAMIN D) 1000 UNITS tablet Take 1,000 Units by mouth daily.      . furosemide (LASIX) 40 MG tablet Take 1 tablet (40 mg total) by mouth daily.    . isosorbide mononitrate (IMDUR) 30 MG 24 hr tablet Take 2 tablets (60 mg total) by mouth daily.    Marland Kitchen levothyroxine (SYNTHROID, LEVOTHROID) 50 MCG tablet Take 50 mcg by mouth daily.    . Multiple Vitamins-Minerals (CENTRUM SILVER PO) Take 1 tablet by mouth daily.     . pantoprazole (PROTONIX) 40 MG tablet Take 40 mg by mouth daily.     . tamsulosin (FLOMAX) 0.4 MG CAPS capsule Take 0.4 mg by mouth daily after supper.      No current facility-administered medications for this visit.       Review of Systems:     Cardiac Review of Systems: [Y] = yes  or   [ N ] = no   Chest Pain Blue.Reese    ]  Resting SOB [ n  ] Exertional SOB  Blue.Reese  ]  Orthopnea  Florencio.Farrier  ]   Pedal Edema [  n  ]    Palpitations [ n ] Syncope  [n  ]   Presyncope [ n  ]   General Review of Systems: [Y] = yes [  ]=no Constitional: recent weight change [  ];  Wt loss over the last 3 months [   ] anorexia [  ]; fatigue [  ]; nausea [  ]; night sweats [  ]; fever [  ]; or chills [  ];           Eye : blurred vision [  ]; diplopia [   ]; vision changes [  ];  Amaurosis fugax[  ]; Resp: cough [  ];  wheezing[  ];  hemoptysis[  ]; shortness of breath[  ]; paroxysmal nocturnal dyspnea[  ]; dyspnea on exertion[  ]; or orthopnea[  ];  GI:  gallstones[  ], vomiting[  ];  dysphagia[  ]; melena[  ];  hematochezia [  ]; heartburn[  ];   Hx of  Colonoscopy[  ]; GU: kidney stones [  ]; hematuria[  ];   dysuria [  ];  nocturia[  ];  history of     obstruction [  ]; urinary frequency [  ]             Skin: rash, swelling[  ];, hair loss[  ];  peripheral edema[  ];  or itching[  ]; Musculosketetal: myalgias[  ];  joint swelling[  ];  joint erythema[  ];  joint pain[  ];  back pain[  ];  Heme/Lymph: bruising[  ];  bleeding[  ];  anemia[  ];  Neuro: TIA[  ];  headaches[  ];  stroke[  ];  vertigo[  ];  seizures[  ];   paresthesias[  ];  difficulty walking[  ];  Psych:depression[  ]; anxiety[  ];  Endocrine: diabetes[  ];  thyroid dysfunction[  ];  Immunizations: Flu up to date [  ]; Pneumococcal up to date [  ];  Other:     PHYSICAL EXAMINATION: BP 122/76 (BP Location: Right Arm, Patient Position: Sitting, Cuff Size: Normal)   Pulse 69   Resp 18   Ht 6\' 3"  (1.905 m)   Wt 204 lb (92.5 kg)   SpO2 95% Comment: RA  BMI 25.50 kg/m  General appearance: alert, cooperative and no distress Neck: no adenopathy, no carotid bruit, no JVD, supple, symmetrical, trachea midline and thyroid not enlarged, symmetric, no tenderness/mass/nodules Lymph nodes: Cervical, supraclavicular, and axillary nodes normal. Resp: clear to auscultation bilaterally Back: symmetric, no curvature. ROM normal. No CVA  tenderness. Cardio: regular rate and rhythm, S1, S2 normal, no murmur, click, rub or gallop GI: soft, non-tender; bowel sounds normal; no masses,  no organomegaly Extremities: extremities normal, atraumatic, no cyanosis or edema and Homans sign is negative, no sign of DVT Neurologic: Grossly normal  Diagnostic Studies & Laboratory data:     Recent Radiology Findings:   Dg Chest 1 View  Result Date: 02/24/2019 CLINICAL DATA:  Status post left thoracentesis. EXAM: CHEST  1 VIEW COMPARISON:  Radiographs of February 21, 2019. FINDINGS: Stable cardiomediastinal silhouette. Status post coronary bypass graft. Atherosclerosis of thoracic aorta is noted. No pneumothorax is noted. Right lung is clear. Left pleural effusion noted on prior exam has resolved status post thoracentesis. Minimal left basilar residual atelectasis or scarring remains. Bony thorax is unremarkable. IMPRESSION: Left pleural effusion noted on prior exam has resolved status post thoracentesis. Minimal left basilar residual atelectasis or scarring remains. No  pneumothorax is noted. Aortic Atherosclerosis (ICD10-I70.0). Electronically Signed   By: Marijo Conception M.D.   On: 02/24/2019 14:09   Dg Chest 2 View  Result Date: 03/09/2019 CLINICAL DATA:  Left-sided recurrent pleural effusion. EXAM: CHEST - 2 VIEW COMPARISON:  02/24/2019. FINDINGS: Prior CABG. Heart size normal. Mild left base atelectasis. Small left-sided pleural effusion, new from prior exam. No pneumothorax. Degenerative change thoracic spine and both shoulders. IMPRESSION: 1.  Small left pleural effusion, new from prior exam. 2.  Mild left base atelectasis. 3.  Prior CABG.  Heart size stable. Electronically Signed   By: Marcello Moores  Register   On: 03/09/2019 08:33   Dg Chest 2 View  Result Date: 02/21/2019 CLINICAL DATA:  Pleural effusion EXAM: CHEST - 2 VIEW COMPARISON:  Feb 04, 2019. FINDINGS: There is a fairly small left effusion which appears slightly larger compared to most recent  study. There is consolidation as well in the left base. Lungs elsewhere are clear. Heart size and pulmonary vascularity are normal. No adenopathy. Patient is status post coronary artery bypass grafting. There is aortic atherosclerosis. There is degenerative change in each shoulder as well as in the thoracic spine. IMPRESSION: Fairly small left pleural effusion, slightly larger than on study from 2 weeks prior with associated left base atelectasis/consolidation. Lungs elsewhere clear. Heart size normal. Postoperative changes noted. Aortic Atherosclerosis (ICD10-I70.0). Electronically Signed   By: Lowella Grip III M.D.   On: 02/21/2019 13:58   Ct Chest Wo Contrast  Result Date: 03/14/2019 CLINICAL DATA:  Chest pain.  Left pleural effusion. EXAM: CT CHEST WITHOUT CONTRAST TECHNIQUE: Multidetector CT imaging of the chest was performed following the standard protocol without IV contrast. COMPARISON:  10/24/2018. FINDINGS: Cardiovascular: The heart size is normal. No substantial pericardial effusion. Coronary artery calcification is evident. Patient is status post CABG. Atherosclerotic calcification is noted in the wall of the thoracic aorta. Ascending thoracic aorta measures 4.2 cm diameter. Mediastinum/Nodes: 2.1 x 3.5 x 3.8 cm lesion is identified in the mediastinum, wedged between the left atrium in the esophagus and apparently arising from the pericardium (see image 107 of sagittal series 6). This lesion has low attenuation and is probably complex fluid. No other evidence of mediastinal or hilar lymphadenopathy on this noncontrast study. There is no hilar lymphadenopathy. The esophagus has normal imaging features. Lungs/Pleura: Centrilobular emphsyema noted. Biapical pleuroparenchymal scarring noted. Tiny right upper lobe calcified granuloma. 4 mm subpleural right middle lobe nodule (109/8) is unchanged since an exam of 06/24/2011, consistent with benign etiology. Trace atelectasis noted posterior right  lower lobe with evidence of small airway impaction. No suspicious pulmonary nodule or mass in the aerated portions of the left lung. There is some lingular and left lower lobe collapse/consolidation. Small left pleural effusion again noted. Upper Abdomen: Unremarkable Musculoskeletal: No worrisome lytic or sclerotic osseous abnormality. IMPRESSION: 1. Left lower lobe collapse/consolidation with small left pleural effusion. There is some trace collapse/consolidative change in the lingula. 2. New 2.1 x 3.5 x 3.8 cm rim enhancing lesion in the posterior pericardium, between the left atrium in the esophagus. This is relatively low attenuation and may represent loculated complex pericardial fluid. Endoscopic ultrasound would likely prove helpful to further evaluate as clinically warranted. 3. Ascending thoracic aorta is stable at 4.2 cm diameter. 4.  Emphysema. (ICD10-J43.9) 5.  Aortic Atherosclerois (ICD10-170.0) Electronically Signed   By: Misty Stanley M.D.   On: 03/14/2019 13:00   Ir Thoracentesis Asp Pleural Space W/img Guide  Result Date: 02/24/2019 INDICATION: Patient with  history of dyspnea and recurrent left-sided pleural effusion. Request is made for diagnostic and therapeutic left thoracentesis. EXAM: ULTRASOUND GUIDED DIAGNOSTIC AND THERAPEUTIC LEFT THORACENTESIS MEDICATIONS: 10 mL 1% lidocaine COMPLICATIONS: None immediate. PROCEDURE: An ultrasound guided thoracentesis was thoroughly discussed with the patient and questions answered. The benefits, risks, alternatives and complications were also discussed. The patient understands and wishes to proceed with the procedure. Written consent was obtained. Ultrasound was performed to localize and mark an adequate pocket of fluid in the left chest. The area was then prepped and draped in the normal sterile fashion. 1% Lidocaine was used for local anesthesia. Under ultrasound guidance a 6 Fr Safe-T-Centesis catheter was introduced. Thoracentesis was performed. The  catheter was removed and a dressing applied. FINDINGS: A total of approximately 1.3 L of clear gold fluid was removed. Samples were sent to the laboratory as requested by the clinical team. IMPRESSION: Successful ultrasound guided left thoracentesis yielding 1.3 L of pleural fluid. Read by: Earley Abide, PA-C Electronically Signed   By: Jerilynn Mages.  Shick M.D.   On: 02/24/2019 14:14     I have independently reviewed the above radiology studies  and reviewed the findings with the patient.   Recent Lab Findings: Lab Results  Component Value Date   WBC 6.5 03/08/2019   HGB 14.8 03/08/2019   HCT 45.1 03/08/2019   PLT 187.0 03/08/2019   GLUCOSE 108 (H) 01/13/2019   CHOL 96 11/09/2018   TRIG 75 11/09/2018   HDL 29 (L) 11/09/2018   LDLCALC 52 11/09/2018   ALT 12 01/10/2019   AST 15 01/10/2019   NA 138 01/13/2019   K 3.9 01/13/2019   CL 98 01/13/2019   CREATININE 1.36 (H) 01/13/2019   BUN 23 01/13/2019   CO2 29 01/13/2019   TSH 6.979 (H) 11/22/2018   INR 1.16 10/21/2018   HGBA1C (H) 12/06/2010    5.8 (NOTE)                                                                       According to the ADA Clinical Practice Recommendations for 2011, when HbA1c is used as a screening test:   >=6.5%   Diagnostic of Diabetes Mellitus           (if abnormal result  is confirmed)  5.7-6.4%   Increased risk of developing Diabetes Mellitus  References:Diagnosis and Classification of Diabetes Mellitus,Diabetes QQIW,9798,92(JJHER 1):S62-S69 and Standards of Medical Care in         Diabetes - 2011,Diabetes Care,2011,34  (Suppl 1):S11-S61.      Assessment / Plan:   Small recurrent left pleural effusion, unknown etiology but persistent,  did not originally with left pneumothorax.   Follow-up CT scan shows new mass between the left atrium and the esophagus- New 2.1 x 3.5 x 3.8 cm rim enhancing lesion in the posterior pericardium, between the left atrium in the esophagus. T  I discussed the treatment options with  the patient including placement of Pleurx catheter observation or left video-assisted thoracoscopy with biopsy .      With the patient's relatively recent myocardial infarction will last Dr. Trey Paula to provide cardiac clearance prior to consideration of left video-assisted thoracoscopy.  In addition we will obtain an echocardiogram to further evaluate the  new tube 3.8 cm mass between the left atrium and esophagus-this may represent loculated pericardial fluid.     Grace Isaac MD      [address] [place] [address] Office [phone]   Beeper [phone]  03/16/2019 10:07 PM

## 2019-03-17 ENCOUNTER — Ambulatory Visit: Payer: Medicare Other | Admitting: Cardiothoracic Surgery

## 2019-03-17 NOTE — Telephone Encounter (Signed)
If I have echo + followup scheduled, I'll just clear him then unless they want to do it sooner.  Then I'd probably need to get him in sooner.

## 2019-03-17 NOTE — Telephone Encounter (Signed)
Eric Gordon has a PMH of CAD s/p CABG 2012, HTN, HLD, remote PE, hypothyrodism, OSA and recent NSTEMI along with spontaneous pneumothorax with recurrent L pleural effusion. Planning to proceed with L VATS by Eric Gordon. Cardiac cath in Jan 2020 showed patent LIMA, but occluded SVG to RCA, occluded distal sequential limb of SVG to LCx and SVG to diag. Medical therapy was recommended. 70% disease in RCA with negative FFR. Due to recurrent pleeral effusion, underwent L thoracensis on 02/24/2019 with removal of 1.3 L of pleural fluid. Recent CT of chest on 6/22 showed LLL collapse/consolidation, new 2.1x3.5x3.8 rim enhancing lesion in posterior pericardium which may represent loculated complex pericardial fluid.  Patient was last seen by Eric Gordon of CHF service on 5/6 who note he was feeling poorly and has SOB with minimal exertion. His next followup is on 7/8 with Eric Gordon with preop echo scheduled for 7/6. Eric Gordon, I assume you want to wait until office visit to clear this patient?

## 2019-03-18 NOTE — Telephone Encounter (Signed)
It does appears, per request, CT surgery is requesting he has a earlier appointment and echo.   "Dr. Everrett Coombe office would like the patient to be seen earlier than his appointment on 07/08. The patient will also need an Echo to be cleared for surgery. "  I will ask our staff to check and see if echo can be done earlier.

## 2019-03-18 NOTE — Telephone Encounter (Signed)
I was able to move Mr. Able echo to Monday 6/29 at [place] at the [facility] hospital and office visit with Dr. Aundra Dubin at [facility].

## 2019-03-18 NOTE — Telephone Encounter (Signed)
Called and informed patient of his appointment time with Dr. Aundra Dubin. He verbalized an understanding and all (if any) questions were answered.

## 2019-03-21 ENCOUNTER — Encounter (HOSPITAL_COMMUNITY): Payer: Self-pay | Admitting: Cardiology

## 2019-03-21 ENCOUNTER — Other Ambulatory Visit (HOSPITAL_COMMUNITY)
Admission: RE | Admit: 2019-03-21 | Discharge: 2019-03-21 | Disposition: A | Discharge status: Home / Self Care | Payer: Medicare Other | Source: Ambulatory Visit | Attending: Cardiology | Admitting: Cardiology

## 2019-03-21 ENCOUNTER — Ambulatory Visit (HOSPITAL_BASED_OUTPATIENT_CLINIC_OR_DEPARTMENT_OTHER)
Admission: RE | Admit: 2019-03-21 | Discharge: 2019-03-21 | Disposition: A | Discharge status: Home / Self Care | Payer: Medicare Other | Source: Ambulatory Visit | Attending: Cardiology | Admitting: Cardiology

## 2019-03-21 ENCOUNTER — Other Ambulatory Visit: Payer: Self-pay

## 2019-03-21 ENCOUNTER — Other Ambulatory Visit (HOSPITAL_COMMUNITY): Payer: Self-pay

## 2019-03-21 ENCOUNTER — Encounter (HOSPITAL_COMMUNITY): Payer: Self-pay

## 2019-03-21 ENCOUNTER — Ambulatory Visit (HOSPITAL_COMMUNITY)
Admission: RE | Admit: 2019-03-21 | Discharge: 2019-03-21 | Disposition: A | Discharge status: Home / Self Care | Payer: Medicare Other | Source: Ambulatory Visit | Attending: Cardiothoracic Surgery | Admitting: Cardiothoracic Surgery

## 2019-03-21 VITALS — BP 126/70 | HR 67 | Wt 204.8 lb

## 2019-03-21 DIAGNOSIS — Z01810 Encounter for preprocedural cardiovascular examination: Secondary | ICD-10-CM

## 2019-03-21 DIAGNOSIS — Z79899 Other long term (current) drug therapy: Secondary | ICD-10-CM | POA: Insufficient documentation

## 2019-03-21 DIAGNOSIS — I712 Thoracic aortic aneurysm, without rupture: Secondary | ICD-10-CM | POA: Insufficient documentation

## 2019-03-21 DIAGNOSIS — I714 Abdominal aortic aneurysm, without rupture: Secondary | ICD-10-CM | POA: Insufficient documentation

## 2019-03-21 DIAGNOSIS — Z7902 Long term (current) use of antithrombotics/antiplatelets: Secondary | ICD-10-CM | POA: Diagnosis not present

## 2019-03-21 DIAGNOSIS — I350 Nonrheumatic aortic (valve) stenosis: Secondary | ICD-10-CM | POA: Insufficient documentation

## 2019-03-21 DIAGNOSIS — Z01818 Encounter for other preprocedural examination: Secondary | ICD-10-CM | POA: Insufficient documentation

## 2019-03-21 DIAGNOSIS — Z951 Presence of aortocoronary bypass graft: Secondary | ICD-10-CM | POA: Diagnosis not present

## 2019-03-21 DIAGNOSIS — I5032 Chronic diastolic (congestive) heart failure: Secondary | ICD-10-CM

## 2019-03-21 DIAGNOSIS — Z8249 Family history of ischemic heart disease and other diseases of the circulatory system: Secondary | ICD-10-CM | POA: Diagnosis not present

## 2019-03-21 DIAGNOSIS — Z7901 Long term (current) use of anticoagulants: Secondary | ICD-10-CM | POA: Insufficient documentation

## 2019-03-21 DIAGNOSIS — Z87891 Personal history of nicotine dependence: Secondary | ICD-10-CM | POA: Diagnosis not present

## 2019-03-21 DIAGNOSIS — G250 Essential tremor: Secondary | ICD-10-CM | POA: Insufficient documentation

## 2019-03-21 DIAGNOSIS — Z1159 Encounter for screening for other viral diseases: Secondary | ICD-10-CM | POA: Insufficient documentation

## 2019-03-21 DIAGNOSIS — I480 Paroxysmal atrial fibrillation: Secondary | ICD-10-CM

## 2019-03-21 DIAGNOSIS — E785 Hyperlipidemia, unspecified: Secondary | ICD-10-CM | POA: Diagnosis not present

## 2019-03-21 DIAGNOSIS — I5189 Other ill-defined heart diseases: Secondary | ICD-10-CM

## 2019-03-21 DIAGNOSIS — I252 Old myocardial infarction: Secondary | ICD-10-CM | POA: Insufficient documentation

## 2019-03-21 DIAGNOSIS — R0602 Shortness of breath: Secondary | ICD-10-CM | POA: Diagnosis not present

## 2019-03-21 DIAGNOSIS — I110 Hypertensive heart disease with heart failure: Secondary | ICD-10-CM | POA: Insufficient documentation

## 2019-03-21 DIAGNOSIS — I2510 Atherosclerotic heart disease of native coronary artery without angina pectoris: Secondary | ICD-10-CM

## 2019-03-21 DIAGNOSIS — R0609 Other forms of dyspnea: Secondary | ICD-10-CM | POA: Diagnosis not present

## 2019-03-21 LAB — BASIC METABOLIC PANEL
Anion gap: 9 (ref 5–15)
BUN: 18 mg/dL (ref 8–23)
CO2: 27 mmol/L (ref 22–32)
Calcium: 9.3 mg/dL (ref 8.9–10.3)
Chloride: 107 mmol/L (ref 98–111)
Creatinine, Ser: 1.21 mg/dL (ref 0.61–1.24)
GFR calc Af Amer: >60 mL/min (ref >60)
GFR calc non Af Amer: 54 mL/min — ABNORMAL LOW (ref >60)
Glucose, Bld: 103 mg/dL — ABNORMAL HIGH (ref 70–99)
Potassium: 4.1 mmol/L (ref 3.5–5.1)
Sodium: 143 mmol/L (ref 135–145)

## 2019-03-21 LAB — BRAIN NATRIURETIC PEPTIDE: B Natriuretic Peptide: 222.7 pg/mL — ABNORMAL HIGH (ref 0.0–100.0)

## 2019-03-21 LAB — SARS CORONAVIRUS 2 (TAT 6-24 HRS): SARS Coronavirus 2: NEGATIVE

## 2019-03-21 MED ORDER — CARVEDILOL 3.125 MG PO TABS: 3.1250 mg | ORAL_TABLET | Freq: Two times a day (BID) | ORAL | 11 refills | Status: DC

## 2019-03-21 NOTE — Progress Notes (Signed)
HDate:  03/21/2019   ID:  [name], DOB Sep 24, 1930, MRN [id number]   Provider location: [facility] Type of Visit: Established patient  PCP:  Shon Baton, MD  Cardiologist:  Dr. Aundra Dubin  Chief Complaint: Shortness of breath   History of Present Illness: Eric Gordon is a 85 y.o. male with history of CAD s/p CABG.  He has had trouble long-term with exertional dyspnea.  Dyspnea triggered evaluation in 2012 leading to CABG but was not resolved by CABG.  PFTs in 3/14 showed only mild obstructive airways disease and V/Q scan showed no PE.  Echo in 9/14 showed normal LV systolic function with moderately dilated RV. Mitchell in 2/15 showed normal right and left heart filling pressures and normal PA pressure.  Finally, he had a CPX in 3/15 that showed normal capacity compared to age-matched sedentary norms.  He was noted to have chronotropic incompetence.  At a prior appointment, I took him off metoprolol given chronotropic incompetence noted on CPX.  Dyspnea improved significantly with weight loss.  He had Cardiolite in 8/18 with EF 52%, no ischemia or infarction.   He was admitted in 1/20 with NSTEMI.  LHC showed new occlusion of the branch of SVG-ramus and OM that touched down on OM.  There were also serial 70% stenoses in the mid/distal RCA.  There was not thought to be an interventional option and patient was treated medically. He was discharged home but presented a couple days later with recurrent chest pain and dyspnea.  Cath was repeated showing no change from prior.  This admission, he had FFR of the RCA which did not suggest hemodynamic significance.  Echo showed EF 55-60%, normal RV.  CTA did not show a PE. He was noted to be volume overloaded and was diuresed.  He was also noted to be in atrial fibrillation with RVR transiently.  ASA/Plavix was stopped and Eliquis was started. He was discharged to SNF.  Atrial fibrillation recurred and he was started on  amiodarone with conversion back to NSR.   In 4/20, he was admitted with left spontaneous PTX requiring chest tube.  After this, he developed recurrent left pleural effusions requiring thoracentesis x 3 so far.  Pleural fluid was exudative with eosinophil predominance.  With elevated ESR, there was concern that amiodarone could be involved, so this medication was stopped.  Patient has seen Dr. Servando Snare, plan for VATS versus Pleurex catheter.  CT chest in 6/20 showed LLL consolidation/collapse with small left effusion, there was a rim-enhancing lesion posterior to the heart between esophagus and left atrium, ?loculated fluid.   Patient returns for followup of dyspnea and CAD. Weight is down 14 lbs since last appt. He generally does ok walking around his house but is short of breath when he walks for longer distances outside.  No orthopnea/PND.  No further lightheadedness/presyncope. No falls. No palpitations. He has had chronic dull pain in his left chest since the PTX.   ECG (personally reviewed): NSR, RBBB, LAFB  Labs (2/15): K 4.2, creatinine 1.2, LDL 87, HDL 39, BNP 71 Labs (4/15): K 3.9, creatinine 1.1 Labs (12/15): LDL 73, HDL 37, K 3.9, creatinine 1.0 Labs (12/16): LDL 71, HDL 49 Labs (3/17): K 4.2, creatinine 1.28, HCT 45 Labs (8/18): LDL 82, HDL 41, TSH normal, hgb 16.3, K 4.1, creatinine 1.05 Labs (2/20): LFTs normal, pro BNP 842 Labs (3/20): K 3.8, creatinine 1.48, TSH mildly elevated at 6.97, free T3 and free T4 normal  Labs (4/20): K 3.9, creatinine 1.36  PMH: 1. Essential tremor 2. CAD: s/p CABG in 3/12.   - Cardiolite (8/18): EF 52%, no ischemia/infarction.  - NSTEMI (1/20):  LHC showed new occlusion of the branch of SVG-ramus and OM that touched down on OM.  There were also serial 70% stenoses in the mid/distal RCA.  FFR of RCA was negative.  3. Atrial fibrillation: Only noted post-op CABG.  4. PE: Post-op CABG in 2012.  5. AAA: 3.6 cm on [place] in 3/14.  3.6 cm on [place] in 3/15.  3.6 cm on [place] 8/16. 4.1 cm on [place] 4/17.  - AAA [place] (5/18): 4.1 cm AAA.  - AAA [place] (5/19): 4.1 cm AAA.  6. OSA: On CPAP.  7. Carotid stenosis: Carotid dopplers (8/54) with 62-70% LICA stenosis.  Carotid dopplers (3/50) with 09-38% LICA stenosis. Carotid dopplers (3/16) with 60-79% RCIA stenosis, 18-29% LICA stenosis.  - Carotid dopplers (8/16) with 40-59% RICA stenosis, 93-71% LICA stenosis.  - Carotid dopplers (8/17) with 69-67% RICA, 89-38% LICA. - Carotid dopplers (9/18) with 10-17% RICA, 51-02% LICA.   - Carotid dopplers (9/19) with 58-52% RICA, 77-82% LICA.  8. Asthma 9. PNA x 2 10. Chronic diastolic CHF/dyspnea: Echo (9/14) with EF 60-65%, mild LVH, very mild AS with mean gradient 10 mmHg, RV moderately dilated.  PFTs (3/14) showed mild obstructive airways disease.  V/Q scan (3/14) with no PE.  ENT workup for upper respiratory causes was negative.  RHC (2/15) with mean RA 7, PA 32/12, mean PCWP 13, CI 3.18.  CPX (3/15) with peak VO2 16.4, VE/VCO2 33; normal when compared to age-matched sedentary normals; chronotropic incompetence was noted.  Dyspnea was improved with weight loss.  - Echo (8/17): EF 60-65%, mild AS.  - Echo (1/20): EF 55-60%, normal RV size and systolic function.  - Echo (6/20): EF 55%, mild LVH, mild AS, normal RV size and systolic function, ?LA mass or mass impinging on posterior LA.  11. HTN 12. Aortic stenosis: Mild.  13. Ascending aortic aneurysm: CTA chest with 4.4 cm ascending aorta in 1/20.  - 4.2 cm ascending aorta on CT chest 6/20.  14. Atrial fibrillation: Paroxysmal.  15. CKD: Stage 3.  16. Left lung spontaneous PTX then recurrent left pleural effusion.  59. Zio patch (6/20): NSR with 1 short NSVT run and few short SVT runs, no atrial fibrillation.     Current Outpatient Medications  Medication Sig Dispense Refill  . apixaban (ELIQUIS) 5 MG TABS tablet Take 1 tablet (5 mg total) by mouth 2 (two) times daily. 60 tablet 5  . Ascorbic Acid 500 MG CAPS Take 500  mg by mouth daily.    Marland Kitchen atorvastatin (LIPITOR) 80 MG tablet Take 1 tablet (80 mg total) by mouth daily. 30 tablet 6  . cetirizine (ZYRTEC) 10 MG tablet Take 10 mg by mouth daily.      . cholecalciferol (VITAMIN D) 1000 UNITS tablet Take 1,000 Units by mouth daily.      . furosemide (LASIX) 40 MG tablet Take 1 tablet (40 mg total) by mouth daily.    . isosorbide mononitrate (IMDUR) 30 MG 24 hr tablet Take 2 tablets (60 mg total) by mouth daily.    Marland Kitchen levothyroxine (SYNTHROID, LEVOTHROID) 50 MCG tablet Take 50 mcg by mouth daily.    . Multiple Vitamins-Minerals (CENTRUM SILVER PO) Take 1 tablet by mouth daily.     . pantoprazole (PROTONIX) 40 MG tablet Take 40 mg by mouth daily.     Marland Kitchen  tamsulosin (FLOMAX) 0.4 MG CAPS capsule Take 0.4 mg by mouth daily after supper.     . carvedilol (COREG) 3.125 MG tablet Take 1 tablet (3.125 mg total) by mouth 2 (two) times daily. 60 tablet 11   No current facility-administered medications for this encounter.     Allergies:   Ancef [cefazolin sodium] and Vancomycin   Social History:  The patient  reports that he quit smoking about 35 years ago. His smoking use included cigarettes. He has a 35.00 pack-year smoking history. He has never used smokeless tobacco. He reports current alcohol use of about 2.0 standard drinks of alcohol per week. He reports that he does not use drugs.   Family History:  The patient's family history includes Coronary artery disease in his mother; Heart disease in his mother; Hypertension in his mother.   ROS:  Please see the history of present illness.   All other systems are personally reviewed and negative.   Exam:   BP 126/70   Pulse 67   Wt 92.9 kg (204 lb 12.8 oz)   SpO2 97%   BMI 25.60 kg/m  General: NAD Neck: No JVD, no thyromegaly or thyroid nodule.  Lungs: Mildly decreased BS left base.  CV: Nondisplaced PMI.  Heart regular S1/S2, no S3/S4, 1/6 SEM RUSB.  No peripheral edema.  No carotid bruit.  Normal pedal pulses.   Abdomen: Soft, nontender, no hepatosplenomegaly, no distention.  Skin: Intact without lesions or rashes.  Neurologic: Alert and oriented x 3.  Psych: Normal affect. Extremities: No clubbing or cyanosis.  HEENT: Normal.   Recent Labs: 11/22/2018: TSH 6.979 01/10/2019: ALT 12 01/13/2019: Magnesium 1.8 03/08/2019: Hemoglobin 14.8; Platelets 187.0 03/21/2019: B Natriuretic Peptide 222.7; BUN 18; Creatinine, Ser 1.21; Potassium 4.1; Sodium 143  Personally reviewed   Wt Readings from Last 3 Encounters:  03/21/19 92.9 kg (204 lb 12.8 oz)  03/16/19 92.5 kg (204 lb)  03/09/19 92.1 kg (203 lb)      ASSESSMENT AND PLAN:  1. CAD: s/p CABG 3/12. NSTEMI 1/20, LHC showed new occlusion of the branch of SVG-ramus and OM that touched down on OM.  There were also serial 70% stenoses in the mid/distal RCA.  FFR of RCA was negative.  No interventional target. The chronic dull left-sided chest pain that he has now seems to be related to the left lung pleural disease.  - No ASA given Eliquis use.  - Continue Imdur 60 mg daily.  - Continue atorvastatin 80 daily.   - Off ranolazine with prolonged QTc.  2. Hyperlipidemia: Goal LDL < 70.   - On atorvastatin 80 daily, good lipids in 3/20.  3. Chronic diastolic CHF: Echo today was reviewed, EF 55-60%, mild LVH, normal RV. Chronic NYHA class III symptoms.  He is not volume overloaded on exam.  I think that his dyspnea is related to his left lung disease. - Continue Lasix 40 mg daily. BMET today.  4. Spontaneous left PTX with recurrent left-sided pleural effusion:  Plan for VATS, biopsy, and possible talc pleurodesis by Dr. Servando Snare.   - I think he is of acceptable risk for this procedure.  I will start him back on a low dose of Coreg, 3.125 mg bid.  5. Carotid stenosis: Repeat carotid dopplers in 9/20, moderate stenosis on prior studies.   6. AAA: Needs repeat AAA [place].   7. Aortic stenosis: Mild on echo today.   8. Atrial fibrillation: Paroxysmal. He is  significantly symptomatic while in atrial fibrillation.  He is  in NSR today.  He is off amiodarone due to concern for pulmonary toxicity.   - Continue Eliquis 5 mg bid. Denies bleeding.  9. LA mass versus mass impinging on LA: Possible loculated pericardial effusion.  Seen again on TTE today.  - I think this will be seen better with TEE.  I will set him up for TEE in the next week.  We discussed risks/benefits and he agrees to procedure.    Recommended follow-up:  3 months.  Signed, [name], MD  03/21/2019  [place] [address] [facility] [place] [address] [phone] (office) [phone] (fax)

## 2019-03-21 NOTE — Progress Notes (Signed)
  Echocardiogram 2D Echocardiogram has been performed.  Bobbye Charleston 03/21/2019, 9:05 AM

## 2019-03-21 NOTE — Patient Instructions (Addendum)
Labs done today. We will call only if labs are abnormal.  START Carvedilol 3.125mg . Take one tablet twice a day.   You are scheduled for a COVID screening test today at 1:25pm. Please arrive by 1:10pm for check-in.   Your physician has requested that you have a TEE. During a TEE, sound waves are used to create images of your heart. It provides your doctor with information about the size and shape of your heart and how well your heart's chambers and valves are working. In this test, a transducer is attached to the end of a flexible tube that's guided down your throat and into your esophagus (the tube leading from you mouth to your stomach) to get a more detailed image of your heart. You are not awake for the procedure. Please see the instruction sheet given to you today. For further information please visit [email or web address].  Your physician recommends that you schedule a follow-up appointment in: 3 months. (someone will call to schedule an appointment)

## 2019-03-21 NOTE — H&P (View-Only) (Signed)
HDate:  03/21/2019   ID:  Hendricks Gordon, DOB Sep 24, 1930, MRN [id number]   Provider location: [facility] Type of Visit: Established patient  PCP:  Shon Baton, MD  Cardiologist:  Dr. Aundra Dubin  Chief Complaint: Shortness of breath   History of Present Illness: Eric Gordon is a 85 y.o. male with history of CAD s/p CABG.  He has had trouble long-term with exertional dyspnea.  Dyspnea triggered evaluation in 2012 leading to CABG but was not resolved by CABG.  PFTs in 3/14 showed only mild obstructive airways disease and V/Q scan showed no PE.  Echo in 9/14 showed normal LV systolic function with moderately dilated RV. Mitchell in 2/15 showed normal right and left heart filling pressures and normal PA pressure.  Finally, he had a CPX in 3/15 that showed normal capacity compared to age-matched sedentary norms.  He was noted to have chronotropic incompetence.  At a prior appointment, I took him off metoprolol given chronotropic incompetence noted on CPX.  Dyspnea improved significantly with weight loss.  He had Cardiolite in 8/18 with EF 52%, no ischemia or infarction.   He was admitted in 1/20 with NSTEMI.  LHC showed new occlusion of the branch of SVG-ramus and OM that touched down on OM.  There were also serial 70% stenoses in the mid/distal RCA.  There was not thought to be an interventional option and patient was treated medically. He was discharged home but presented a couple days later with recurrent chest pain and dyspnea.  Cath was repeated showing no change from prior.  This admission, he had FFR of the RCA which did not suggest hemodynamic significance.  Echo showed EF 55-60%, normal RV.  CTA did not show a PE. He was noted to be volume overloaded and was diuresed.  He was also noted to be in atrial fibrillation with RVR transiently.  ASA/Plavix was stopped and Eliquis was started. He was discharged to SNF.  Atrial fibrillation recurred and he was started on  amiodarone with conversion back to NSR.   In 4/20, he was admitted with left spontaneous PTX requiring chest tube.  After this, he developed recurrent left pleural effusions requiring thoracentesis x 3 so far.  Pleural fluid was exudative with eosinophil predominance.  With elevated ESR, there was concern that amiodarone could be involved, so this medication was stopped.  Patient has seen Dr. Servando Snare, plan for VATS versus Pleurex catheter.  CT chest in 6/20 showed LLL consolidation/collapse with small left effusion, there was a rim-enhancing lesion posterior to the heart between esophagus and left atrium, ?loculated fluid.   Patient returns for followup of dyspnea and CAD. Weight is down 14 lbs since last appt. He generally does ok walking around his house but is short of breath when he walks for longer distances outside.  No orthopnea/PND.  No further lightheadedness/presyncope. No falls. No palpitations. He has had chronic dull pain in his left chest since the PTX.   ECG (personally reviewed): NSR, RBBB, LAFB  Labs (2/15): K 4.2, creatinine 1.2, LDL 87, HDL 39, BNP 71 Labs (4/15): K 3.9, creatinine 1.1 Labs (12/15): LDL 73, HDL 37, K 3.9, creatinine 1.0 Labs (12/16): LDL 71, HDL 49 Labs (3/17): K 4.2, creatinine 1.28, HCT 45 Labs (8/18): LDL 82, HDL 41, TSH normal, hgb 16.3, K 4.1, creatinine 1.05 Labs (2/20): LFTs normal, pro BNP 842 Labs (3/20): K 3.8, creatinine 1.48, TSH mildly elevated at 6.97, free T3 and free T4 normal  Labs (4/20): K 3.9, creatinine 1.36  PMH: 1. Essential tremor 2. CAD: s/p CABG in 3/12.   - Cardiolite (8/18): EF 52%, no ischemia/infarction.  - NSTEMI (1/20):  LHC showed new occlusion of the branch of SVG-ramus and OM that touched down on OM.  There were also serial 70% stenoses in the mid/distal RCA.  FFR of RCA was negative.  3. Atrial fibrillation: Only noted post-op CABG.  4. PE: Post-op CABG in 2012.  5. AAA: 3.6 cm on US in 3/14.  3.6 cm on US in 3/15.  3.6 cm on US 8/16. 4.1 cm on US 4/17.  - AAA US (5/18): 4.1 cm AAA.  - AAA US (5/19): 4.1 cm AAA.  6. OSA: On CPAP.  7. Carotid stenosis: Carotid dopplers (9/14) with 60-79% LICA stenosis.  Carotid dopplers (9/15) with 60-79% LICA stenosis. Carotid dopplers (3/16) with 60-79% RCIA stenosis, 40-59% LICA stenosis.  - Carotid dopplers (8/16) with 40-59% RICA stenosis, 60-79% LICA stenosis.  - Carotid dopplers (8/17) with 60-79% RICA, 40-59% LICA. - Carotid dopplers (9/18) with 40-59% RICA, 60-79% LICA.   - Carotid dopplers (9/19) with 40-59% RICA, 60-79% LICA.  8. Asthma 9. PNA x 2 10. Chronic diastolic CHF/dyspnea: Echo (9/14) with EF 60-65%, mild LVH, very mild AS with mean gradient 10 mmHg, RV moderately dilated.  PFTs (3/14) showed mild obstructive airways disease.  V/Q scan (3/14) with no PE.  ENT workup for upper respiratory causes was negative.  RHC (2/15) with mean RA 7, PA 32/12, mean PCWP 13, CI 3.18.  CPX (3/15) with peak VO2 16.4, VE/VCO2 33; normal when compared to age-matched sedentary normals; chronotropic incompetence was noted.  Dyspnea was improved with weight loss.  - Echo (8/17): EF 60-65%, mild AS.  - Echo (1/20): EF 55-60%, normal RV size and systolic function.  - Echo (6/20): EF 55%, mild LVH, mild AS, normal RV size and systolic function, ?LA mass or mass impinging on posterior LA.  11. HTN 12. Aortic stenosis: Mild.  13. Ascending aortic aneurysm: CTA chest with 4.4 cm ascending aorta in 1/20.  - 4.2 cm ascending aorta on CT chest 6/20.  14. Atrial fibrillation: Paroxysmal.  15. CKD: Stage 3.  16. Left lung spontaneous PTX then recurrent left pleural effusion.  17. Zio patch (6/20): NSR with 1 short NSVT run and few short SVT runs, no atrial fibrillation.     Current Outpatient Medications  Medication Sig Dispense Refill  . apixaban (ELIQUIS) 5 MG TABS tablet Take 1 tablet (5 mg total) by mouth 2 (two) times daily. 60 tablet 5  . Ascorbic Acid 500 MG CAPS Take 500  mg by mouth daily.    . atorvastatin (LIPITOR) 80 MG tablet Take 1 tablet (80 mg total) by mouth daily. 30 tablet 6  . cetirizine (ZYRTEC) 10 MG tablet Take 10 mg by mouth daily.      . cholecalciferol (VITAMIN D) 1000 UNITS tablet Take 1,000 Units by mouth daily.      . furosemide (LASIX) 40 MG tablet Take 1 tablet (40 mg total) by mouth daily.    . isosorbide mononitrate (IMDUR) 30 MG 24 hr tablet Take 2 tablets (60 mg total) by mouth daily.    . levothyroxine (SYNTHROID, LEVOTHROID) 50 MCG tablet Take 50 mcg by mouth daily.    . Multiple Vitamins-Minerals (CENTRUM SILVER PO) Take 1 tablet by mouth daily.     . pantoprazole (PROTONIX) 40 MG tablet Take 40 mg by mouth daily.     .   tamsulosin (FLOMAX) 0.4 MG CAPS capsule Take 0.4 mg by mouth daily after supper.     . carvedilol (COREG) 3.125 MG tablet Take 1 tablet (3.125 mg total) by mouth 2 (two) times daily. 60 tablet 11   No current facility-administered medications for this encounter.     Allergies:   Ancef [cefazolin sodium] and Vancomycin   Social History:  The patient  reports that he quit smoking about 35 years ago. His smoking use included cigarettes. He has a 35.00 pack-year smoking history. He has never used smokeless tobacco. He reports current alcohol use of about 2.0 standard drinks of alcohol per week. He reports that he does not use drugs.   Family History:  The patient's family history includes Coronary artery disease in his mother; Heart disease in his mother; Hypertension in his mother.   ROS:  Please see the history of present illness.   All other systems are personally reviewed and negative.   Exam:   BP 126/70   Pulse 67   Wt 92.9 kg (204 lb 12.8 oz)   SpO2 97%   BMI 25.60 kg/m  General: NAD Neck: No JVD, no thyromegaly or thyroid nodule.  Lungs: Mildly decreased BS left base.  CV: Nondisplaced PMI.  Heart regular S1/S2, no S3/S4, 1/6 SEM RUSB.  No peripheral edema.  No carotid bruit.  Normal pedal pulses.   Abdomen: Soft, nontender, no hepatosplenomegaly, no distention.  Skin: Intact without lesions or rashes.  Neurologic: Alert and oriented x 3.  Psych: Normal affect. Extremities: No clubbing or cyanosis.  HEENT: Normal.   Recent Labs: 11/22/2018: TSH 6.979 01/10/2019: ALT 12 01/13/2019: Magnesium 1.8 03/08/2019: Hemoglobin 14.8; Platelets 187.0 03/21/2019: B Natriuretic Peptide 222.7; BUN 18; Creatinine, Ser 1.21; Potassium 4.1; Sodium 143  Personally reviewed   Wt Readings from Last 3 Encounters:  03/21/19 92.9 kg (204 lb 12.8 oz)  03/16/19 92.5 kg (204 lb)  03/09/19 92.1 kg (203 lb)      ASSESSMENT AND PLAN:  1. CAD: s/p CABG 3/12. NSTEMI 1/20, LHC showed new occlusion of the branch of SVG-ramus and OM that touched down on OM.  There were also serial 70% stenoses in the mid/distal RCA.  FFR of RCA was negative.  No interventional target. The chronic dull left-sided chest pain that he has now seems to be related to the left lung pleural disease.  - No ASA given Eliquis use.  - Continue Imdur 60 mg daily.  - Continue atorvastatin 80 daily.   - Off ranolazine with prolonged QTc.  2. Hyperlipidemia: Goal LDL < 70.   - On atorvastatin 80 daily, good lipids in 3/20.  3. Chronic diastolic CHF: Echo today was reviewed, EF 55-60%, mild LVH, normal RV. Chronic NYHA class III symptoms.  He is not volume overloaded on exam.  I think that his dyspnea is related to his left lung disease. - Continue Lasix 40 mg daily. BMET today.  4. Spontaneous left PTX with recurrent left-sided pleural effusion:  Plan for VATS, biopsy, and possible talc pleurodesis by Dr. Gerhardt.   - I think he is of acceptable risk for this procedure.  I will start him back on a low dose of Coreg, 3.125 mg bid.  5. Carotid stenosis: Repeat carotid dopplers in 9/20, moderate stenosis on prior studies.   6. AAA: Needs repeat AAA US.   7. Aortic stenosis: Mild on echo today.   8. Atrial fibrillation: Paroxysmal. He is  significantly symptomatic while in atrial fibrillation.  He is   in NSR today.  He is off amiodarone due to concern for pulmonary toxicity.   - Continue Eliquis 5 mg bid. Denies bleeding.  9. LA mass versus mass impinging on LA: Possible loculated pericardial effusion.  Seen again on TTE today.  - I think this will be seen better with TEE.  I will set him up for TEE in the next week.  We discussed risks/benefits and he agrees to procedure.    Recommended follow-up:  3 months.  Signed, [name], MD  03/21/2019  [place] [address] [facility] [place] [address] [phone] (office) [phone] (fax)

## 2019-03-23 NOTE — Progress Notes (Signed)
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 patietn has been home since test. Reminded of NPO instuctions and time to arrive.

## 2019-03-24 ENCOUNTER — Encounter (HOSPITAL_COMMUNITY)
Admission: RE | Disposition: A | Discharge status: Home / Self Care | Payer: Self-pay | Source: Home / Self Care | Attending: Cardiology

## 2019-03-24 ENCOUNTER — Other Ambulatory Visit: Payer: Self-pay

## 2019-03-24 ENCOUNTER — Ambulatory Visit (HOSPITAL_COMMUNITY)
Admission: RE | Admit: 2019-03-24 | Discharge: 2019-03-24 | Disposition: A | Discharge status: Home / Self Care | Payer: Medicare Other | Attending: Cardiology | Admitting: Cardiology

## 2019-03-24 ENCOUNTER — Encounter (HOSPITAL_COMMUNITY): Payer: Self-pay | Admitting: *Deleted

## 2019-03-24 ENCOUNTER — Ambulatory Visit (HOSPITAL_BASED_OUTPATIENT_CLINIC_OR_DEPARTMENT_OTHER)
Admission: RE | Admit: 2019-03-24 | Discharge: 2019-03-24 | Disposition: A | Discharge status: Home / Self Care | Payer: Medicare Other | Source: Ambulatory Visit | Attending: Cardiology | Admitting: Cardiology

## 2019-03-24 DIAGNOSIS — G250 Essential tremor: Secondary | ICD-10-CM | POA: Insufficient documentation

## 2019-03-24 DIAGNOSIS — Z881 Allergy status to other antibiotic agents status: Secondary | ICD-10-CM | POA: Insufficient documentation

## 2019-03-24 DIAGNOSIS — Z951 Presence of aortocoronary bypass graft: Secondary | ICD-10-CM | POA: Insufficient documentation

## 2019-03-24 DIAGNOSIS — I350 Nonrheumatic aortic (valve) stenosis: Secondary | ICD-10-CM | POA: Diagnosis not present

## 2019-03-24 DIAGNOSIS — I130 Hypertensive heart and chronic kidney disease with heart failure and stage 1 through stage 4 chronic kidney disease, or unspecified chronic kidney disease: Secondary | ICD-10-CM | POA: Insufficient documentation

## 2019-03-24 DIAGNOSIS — I480 Paroxysmal atrial fibrillation: Secondary | ICD-10-CM | POA: Diagnosis not present

## 2019-03-24 DIAGNOSIS — Z79890 Hormone replacement therapy (postmenopausal): Secondary | ICD-10-CM | POA: Diagnosis not present

## 2019-03-24 DIAGNOSIS — I080 Rheumatic disorders of both mitral and aortic valves: Secondary | ICD-10-CM | POA: Diagnosis not present

## 2019-03-24 DIAGNOSIS — D151 Benign neoplasm of heart: Secondary | ICD-10-CM | POA: Insufficient documentation

## 2019-03-24 DIAGNOSIS — Z87891 Personal history of nicotine dependence: Secondary | ICD-10-CM | POA: Insufficient documentation

## 2019-03-24 DIAGNOSIS — E785 Hyperlipidemia, unspecified: Secondary | ICD-10-CM | POA: Diagnosis not present

## 2019-03-24 DIAGNOSIS — Z7902 Long term (current) use of antithrombotics/antiplatelets: Secondary | ICD-10-CM | POA: Insufficient documentation

## 2019-03-24 DIAGNOSIS — N183 Chronic kidney disease, stage 3 (moderate): Secondary | ICD-10-CM | POA: Diagnosis not present

## 2019-03-24 DIAGNOSIS — I252 Old myocardial infarction: Secondary | ICD-10-CM | POA: Insufficient documentation

## 2019-03-24 DIAGNOSIS — G4733 Obstructive sleep apnea (adult) (pediatric): Secondary | ICD-10-CM | POA: Diagnosis not present

## 2019-03-24 DIAGNOSIS — Z8249 Family history of ischemic heart disease and other diseases of the circulatory system: Secondary | ICD-10-CM | POA: Diagnosis not present

## 2019-03-24 DIAGNOSIS — I714 Abdominal aortic aneurysm, without rupture: Secondary | ICD-10-CM | POA: Diagnosis not present

## 2019-03-24 DIAGNOSIS — I2510 Atherosclerotic heart disease of native coronary artery without angina pectoris: Secondary | ICD-10-CM | POA: Diagnosis not present

## 2019-03-24 DIAGNOSIS — Z7901 Long term (current) use of anticoagulants: Secondary | ICD-10-CM | POA: Insufficient documentation

## 2019-03-24 DIAGNOSIS — I5189 Other ill-defined heart diseases: Secondary | ICD-10-CM

## 2019-03-24 DIAGNOSIS — I5032 Chronic diastolic (congestive) heart failure: Secondary | ICD-10-CM | POA: Insufficient documentation

## 2019-03-24 DIAGNOSIS — J90 Pleural effusion, not elsewhere classified: Secondary | ICD-10-CM | POA: Insufficient documentation

## 2019-03-24 DIAGNOSIS — Z79899 Other long term (current) drug therapy: Secondary | ICD-10-CM | POA: Insufficient documentation

## 2019-03-24 HISTORY — PX: TEE WITHOUT CARDIOVERSION: SHX5443

## 2019-03-24 SURGERY — ECHOCARDIOGRAM, TRANSESOPHAGEAL
Anesthesia: Moderate Sedation

## 2019-03-24 MED ORDER — MIDAZOLAM HCL (PF) 10 MG/2ML IJ SOLN
INTRAMUSCULAR | Status: DC | PRN
  Administered 2019-03-24: 2 mg via INTRAVENOUS
  Administered 2019-03-24: 1 mg via INTRAVENOUS
  Administered 2019-03-24: 2 mg via INTRAVENOUS

## 2019-03-24 MED ORDER — FENTANYL CITRATE (PF) 100 MCG/2ML IJ SOLN
INTRAMUSCULAR | Status: AC
  Filled 2019-03-24: qty 2

## 2019-03-24 MED ORDER — FENTANYL CITRATE (PF) 100 MCG/2ML IJ SOLN
INTRAMUSCULAR | Status: DC | PRN
  Administered 2019-03-24 (×2): 25 ug via INTRAVENOUS

## 2019-03-24 MED ORDER — BUTAMBEN-TETRACAINE-BENZOCAINE 2-2-14 % EX AERO
INHALATION_SPRAY | CUTANEOUS | Status: DC | PRN
  Administered 2019-03-24: 2 via TOPICAL

## 2019-03-24 MED ORDER — MIDAZOLAM HCL (PF) 5 MG/ML IJ SOLN
INTRAMUSCULAR | Status: AC
  Filled 2019-03-24: qty 2

## 2019-03-24 MED ORDER — SODIUM CHLORIDE 0.9 % IV SOLN
INTRAVENOUS | Status: DC
  Administered 2019-03-24: 07:00:00 via INTRAVENOUS

## 2019-03-24 NOTE — CV Procedure (Signed)
Procedure: TEE  Indication: LA mass  Sedation: Versed 5 mg IV, Fentanyl 50 mcg IV.   Findings: Please see echo section for full report.  Normal LV size with mild LV hypertrophy.  EF 50-55% with basal inferolateral aneurysm.  Normal right ventricular size and systolic function.  Mild left atrial enlargement, no LA appendage thrombus.  Normal right atrium.  No ASD/PFO by color doppler.  No significant tricuspid regurgitation.  Trivial mitral regurgitation.  Trileaflet aortic valve with moderate calcification.  No aortic insufficiency, mild aortic stenosis. There was a pleural effusion noted.  There appeared to be a loculated pericardial effusion posterior to the left atrium with invagination of the left atrial free wall (this was the structure seen on prior echo). Normal caliber descending thoracic aorta with grade 3-4 plaque.   [name] 03/24/2019 8:31 AM

## 2019-03-24 NOTE — Interval H&P Note (Signed)
History and Physical Interval Note:  03/24/2019 8:04 AM  Eric Gordon  has presented today for surgery, with the diagnosis of LEFT ATRIAL MASS.  The various methods of treatment have been discussed with the patient and family. After consideration of risks, benefits and other options for treatment, the patient has consented to  Procedure(s): TRANSESOPHAGEAL ECHOCARDIOGRAM (TEE) (N/A) as a surgical intervention.  The patient's history has been reviewed, patient examined, no change in status, stable for surgery.  I have reviewed the patient's chart and labs.  Questions were answered to the patient's satisfaction.     Gyanna Jarema [employer]

## 2019-03-24 NOTE — Progress Notes (Signed)
  Echocardiogram 2D Echocardiogram has been performed.  Eric Gordon 03/24/2019, 9:29 AM

## 2019-03-24 NOTE — Discharge Instructions (Signed)
Transesophageal Echocardiogram Transesophageal echocardiogram (TEE) is a test that uses sound waves to take pictures of your heart. TEE is done by passing a flexible tube down the esophagus. The esophagus is the tube that carries food from the throat to the stomach. The pictures give detailed images of your heart. This can help your doctor see if there are problems with your heart.  What happens during the procedure?  To lower your risk of infection, your doctors will wash or clean their hands.  An IV will be put into one of your veins.  You will be given a medicine to help you relax (sedative).  A medicine may be sprayed or gargled. This numbs the back of your throat.  Your blood pressure, heart rate, and breathing will be watched.  You may be asked to lay on your left side.  A bite block will be placed in your mouth. This keeps you from biting the tube.  The tip of the TEE probe will be placed into the back of your mouth.  You will be asked to swallow.  Your doctor will take pictures of your heart.  The probe and bite block will be taken out. The procedure may vary among doctors and hospitals. What happens after the procedure?   Your blood pressure, heart rate, breathing rate, and blood oxygen level will be watched until the medicines you were given have worn off.  When you first wake up, your throat may feel sore and numb. This will get better over time. You will not be allowed to eat or drink until the numbness has gone away.  Do not drive for 24 hours if you were given a medicine to help you relax. Summary  TEE is a test that uses sound waves to take pictures of your heart.  You will be given a medicine to help you relax.  Do not drive for 24 hours if you were given a medicine to help you relax. This information is not intended to replace advice given to you by your health care provider. Make sure you discuss any questions you have with your health care  provider. Document Released: 07/06/2009 Document Revised: 05/28/2018 Document Reviewed: 12/10/2016 Elsevier Patient Education  2020 [employer].

## 2019-03-26 ENCOUNTER — Encounter (HOSPITAL_COMMUNITY): Payer: Self-pay | Admitting: Cardiology

## 2019-03-28 ENCOUNTER — Other Ambulatory Visit (HOSPITAL_COMMUNITY): Payer: Medicare Other

## 2019-03-30 ENCOUNTER — Encounter (HOSPITAL_COMMUNITY): Payer: Medicare Other | Admitting: Cardiology

## 2019-04-07 ENCOUNTER — Other Ambulatory Visit: Payer: Self-pay | Admitting: *Deleted

## 2019-04-07 DIAGNOSIS — J90 Pleural effusion, not elsewhere classified: Secondary | ICD-10-CM

## 2019-04-08 ENCOUNTER — Ambulatory Visit
Admission: RE | Admit: 2019-04-08 | Discharge: 2019-04-08 | Disposition: A | Discharge status: Home / Self Care | Payer: Medicare Other | Source: Ambulatory Visit | Attending: Cardiothoracic Surgery | Admitting: Cardiothoracic Surgery

## 2019-04-08 ENCOUNTER — Ambulatory Visit (INDEPENDENT_AMBULATORY_CARE_PROVIDER_SITE_OTHER): Payer: Medicare Other | Admitting: Cardiothoracic Surgery

## 2019-04-08 ENCOUNTER — Other Ambulatory Visit: Payer: Self-pay | Admitting: *Deleted

## 2019-04-08 ENCOUNTER — Encounter: Payer: Self-pay | Admitting: Cardiothoracic Surgery

## 2019-04-08 ENCOUNTER — Other Ambulatory Visit: Payer: Self-pay

## 2019-04-08 VITALS — BP 122/76 | HR 58 | Resp 18 | Ht 75.0 in | Wt 204.0 lb

## 2019-04-08 DIAGNOSIS — J90 Pleural effusion, not elsewhere classified: Secondary | ICD-10-CM

## 2019-04-08 DIAGNOSIS — I6523 Occlusion and stenosis of bilateral carotid arteries: Secondary | ICD-10-CM

## 2019-04-09 ENCOUNTER — Other Ambulatory Visit (HOSPITAL_COMMUNITY)
Admission: RE | Admit: 2019-04-09 | Discharge: 2019-04-09 | Disposition: A | Discharge status: Home / Self Care | Payer: Medicare Other | Source: Ambulatory Visit | Attending: Cardiothoracic Surgery | Admitting: Cardiothoracic Surgery

## 2019-04-09 DIAGNOSIS — J90 Pleural effusion, not elsewhere classified: Secondary | ICD-10-CM

## 2019-04-09 DIAGNOSIS — Z1159 Encounter for screening for other viral diseases: Secondary | ICD-10-CM | POA: Insufficient documentation

## 2019-04-09 LAB — SARS CORONAVIRUS 2 (TAT 6-24 HRS): SARS Coronavirus 2: NEGATIVE

## 2019-04-09 NOTE — Progress Notes (Signed)
[address]       [place] [address]             [phone]                    Larson L Willhelm [place] Medical Record #[id number] Date of Birth: 1931/09/15  Referring: Lauraine Rinne, NP Primary Care: Shon Baton, MD Primary Cardiologist: No primary care provider on file.  Chief Complaint:    Chief Complaint  Patient presents with  . Pleural Effusion    further discuss sx after Cardiology work-up    History of Present Illness:    [name] 85 y.o. male is seen in the office  Today after recent evaluation by cardiology including TEE. Previously he was  hospitalizated for spontaneous pneumothorax.  He was hospitalized in a left chest tube was placed by the pulmonary service with resolution of the pneumothorax but tube was removed and he was discharged home subsequently he has had a thoracentesis for recurrent left pleural effusion.  He is referred from the pulmonary office.  He denies fever chills or night sweats.  Previously he has never had a pneumothorax.  He did have coronary artery bypass grafting in 2012 with use of left internal mammary artery.     In January of this year he presented with non-STEMI myocardial infarction and underwent cardiac catheterization on 3 occasions over several day.  But ultimately with no intervention done  Current Activity/ Functional Status:  Patient is independent with mobility/ambulation, transfers, ADL's, IADL's.   Zubrod Score: At the time of surgery this patient's most appropriate activity status/level should be described as: []     0    Normal activity, no symptoms [x]     1    Restricted in physical strenuous activity but ambulatory, able to do out light work []     2    Ambulatory and capable of self care, unable to do work activities, up and about               >50 % of waking hours                              []     3    Only limited self care, in bed greater than 50% of waking hours []     4     Completely disabled, no self care, confined to bed or chair []     5    Moribund   Past Medical History:  Diagnosis Date  . Abdominal aortic aneurysm (AAA) ([place])   . Arthritis    "my whole body" (03/19/2018)  . CAD (coronary artery disease)   . Chronic anticoagulation    on coumadin for PE  . Chronic lower back pain   . GERD (gastroesophageal reflux disease)   . High cholesterol   . Hypertension   . Hypothyroidism   . OSA on CPAP   . Pneumonia    "now and once before" (03/19/2018)  . Pulmonary embolism [facility])    April 2012 after CABG  . S/P CABG (coronary artery bypass graft)     Past Surgical History:  Procedure Laterality Date  . CARDIAC CATHETERIZATION  11/2010  . CORONARY ANGIOGRAPHY N/A 10/22/2018   Procedure: CORONARY ANGIOGRAPHY;  Surgeon: Troy Sine, MD;  Location: [place] CV LAB;  Service: Cardiovascular;  Laterality: N/A;  . CORONARY ARTERY BYPASS GRAFT  March 2012   CABG X 5  . INGUINAL HERNIA REPAIR Left 1980  . INTRAVASCULAR PRESSURE WIRE/FFR STUDY N/A 10/22/2018   Procedure: INTRAVASCULAR PRESSURE WIRE/FFR STUDY;  Surgeon: Troy Sine, MD;  Location: [place] CV LAB;  Service: Cardiovascular;  Laterality: N/A;  . IR THORACENTESIS ASP PLEURAL SPACE W/IMG GUIDE  02/04/2019  . IR THORACENTESIS ASP PLEURAL SPACE W/IMG GUIDE  02/24/2019  . KNEE ARTHROSCOPY Left 2006  . LEFT HEART CATH AND CORS/GRAFTS ANGIOGRAPHY N/A 10/18/2018   Procedure: LEFT HEART CATH AND CORS/GRAFTS ANGIOGRAPHY;  Surgeon: Lorretta Harp, MD;  Location: [place] CV LAB;  Service: Cardiovascular;  Laterality: N/A;  . LEFT HEART CATH AND CORS/GRAFTS ANGIOGRAPHY N/A 10/21/2018   Procedure: LEFT HEART CATH AND CORS/GRAFTS ANGIOGRAPHY;  Surgeon: Troy Sine, MD;  Location: [place] CV LAB;  Service: Cardiovascular;  Laterality: N/A;  . PENILE PROSTHESIS IMPLANT    . RIGHT HEART CATHETERIZATION N/A 11/14/2013   Procedure: RIGHT HEART CATH;  Surgeon: Larey Dresser, MD;  Location:  [facility] CATH LAB;  Service: Cardiovascular;  Laterality: N/A;  . SHOULDER OPEN ROTATOR CUFF REPAIR Right 2003  . TEE WITHOUT CARDIOVERSION N/A 03/24/2019   Procedure: TRANSESOPHAGEAL ECHOCARDIOGRAM (TEE);  Surgeon: Larey Dresser, MD;  Location: [facility] ENDOSCOPY;  Service: Cardiovascular;  Laterality: N/A;    Family History  Problem Relation Age of Onset  . Coronary artery disease Mother   . Hypertension Mother   . Heart disease Mother      Social History   Tobacco Use  Smoking Status Former Smoker  . Packs/day: 1.00  . Years: 35.00  . Pack years: 35.00  . Types: Cigarettes  . Quit date: 02/18/1984  . Years since quitting: 35.1  Smokeless Tobacco Never Used    Social History   Substance and Sexual Activity  Alcohol Use Yes  . Alcohol/week: 2.0 standard drinks  . Types: 2 Standard drinks or equivalent per week     Allergies  Allergen Reactions  . Ancef [Cefazolin Sodium] Hives  . Vancomycin Rash    Current Outpatient Medications  Medication Sig Dispense Refill  . acetaminophen (TYLENOL) 500 MG tablet Take 1,000 mg by mouth every 6 (six) hours as needed for moderate pain or headache.    Marland Kitchen apixaban (ELIQUIS) 5 MG TABS tablet Take 1 tablet (5 mg total) by mouth 2 (two) times daily. 60 tablet 5  . Ascorbic Acid 500 MG CAPS Take 500 mg by mouth daily.    Marland Kitchen atorvastatin (LIPITOR) 80 MG tablet Take 1 tablet (80 mg total) by mouth daily. 30 tablet 6  . carvedilol (COREG) 3.125 MG tablet Take 1 tablet (3.125 mg total) by mouth 2 (two) times daily. 60 tablet 11  . cetirizine (ZYRTEC) 10 MG tablet Take 10 mg by mouth daily.      . cholecalciferol (VITAMIN D) 1000 UNITS tablet Take 1,000 Units by mouth every evening.     . furosemide (LASIX) 40 MG tablet Take 1 tablet (40 mg total) by mouth daily.    . isosorbide mononitrate (IMDUR) 30 MG 24 hr tablet Take 2 tablets (60 mg total) by mouth daily.    Marland Kitchen levothyroxine (SYNTHROID, LEVOTHROID) 50 MCG tablet Take 50 mcg by mouth daily.    .  Multiple Vitamins-Minerals (CENTRUM SILVER PO) Take 1 tablet by mouth daily.     . pantoprazole (PROTONIX) 40 MG tablet Take 40 mg by mouth daily.     . tamsulosin (FLOMAX) 0.4 MG CAPS capsule Take 0.4 mg  by mouth daily after supper.      No current facility-administered medications for this visit.       Review of Systems:     Cardiac Review of Systems: [Y] = yes  or   [ N ] = no   Chest Pain Blue.Reese    ]  Resting SOB [ n  ] Exertional SOB  Blue.Reese  ]  Orthopnea Florencio.Farrier  ]   Pedal Edema [ n  ]    Palpitations [ n ] Syncope  [n  ]   Presyncope [ n  ]   General Review of Systems: [Y] = yes [  ]=no Constitional: recent weight change [  ];  Wt loss over the last 3 months [   ] anorexia [  ]; fatigue [  ]; nausea [  ]; night sweats [  ]; fever [  ]; or chills [  ];           Eye : blurred vision [  ]; diplopia [   ]; vision changes [  ];  Amaurosis fugax[  ]; Resp: cough [  ];  wheezing[  ];  hemoptysis[  ]; shortness of breath[  ]; paroxysmal nocturnal dyspnea[  ]; dyspnea on exertion[  ]; or orthopnea[  ];  GI:  gallstones[  ], vomiting[  ];  dysphagia[  ]; melena[  ];  hematochezia [  ]; heartburn[  ];   Hx of  Colonoscopy[  ]; GU: kidney stones [  ]; hematuria[  ];   dysuria [  ];  nocturia[  ];  history of     obstruction [  ]; urinary frequency [  ]             Skin: rash, swelling[  ];, hair loss[  ];  peripheral edema[  ];  or itching[  ]; Musculosketetal: myalgias[  ];  joint swelling[  ];  joint erythema[  ];  joint pain[  ];  back pain[  ];  Heme/Lymph: bruising[  ];  bleeding[  ];  anemia[  ];  Neuro: TIA[  ];  headaches[  ];  stroke[  ];  vertigo[  ];  seizures[  ];   paresthesias[  ];  difficulty walking[  ];  Psych:depression[  ]; anxiety[  ];  Endocrine: diabetes[  ];  thyroid dysfunction[  ];  Immunizations: Flu up to date [  ]; Pneumococcal up to date [  ];  Other:     PHYSICAL EXAMINATION: BP 122/76 (BP Location: Left Arm, Patient Position: Sitting, Cuff Size: Normal)   Pulse (!) 58    Resp 18   Ht 6\' 3"  (1.905 m)   Wt 204 lb (92.5 kg)   SpO2 95% Comment: RA  BMI 25.50 kg/m  General appearance: alert and cooperative Neck: no adenopathy, no carotid bruit, no JVD, supple, symmetrical, trachea midline and thyroid not enlarged, symmetric, no tenderness/mass/nodules Lymph nodes: Cervical, supraclavicular, and axillary nodes normal. Resp: diminished breath sounds LLL Cardio: regular rate and rhythm, S1, S2 normal, no murmur, click, rub or gallop GI: soft, non-tender; bowel sounds normal; no masses,  no organomegaly Extremities: extremities normal, atraumatic, no cyanosis or edema Neurologic: Grossly normal    Diagnostic Studies & Laboratory data:     Recent Radiology Findings:   Dg Chest 1 View  Result Date: 02/24/2019 CLINICAL DATA:  Status post left thoracentesis. EXAM: CHEST  1 VIEW COMPARISON:  Radiographs of February 21, 2019. FINDINGS: Stable cardiomediastinal silhouette. Status post coronary bypass  graft. Atherosclerosis of thoracic aorta is noted. No pneumothorax is noted. Right lung is clear. Left pleural effusion noted on prior exam has resolved status post thoracentesis. Minimal left basilar residual atelectasis or scarring remains. Bony thorax is unremarkable. IMPRESSION: Left pleural effusion noted on prior exam has resolved status post thoracentesis. Minimal left basilar residual atelectasis or scarring remains. No pneumothorax is noted. Aortic Atherosclerosis (ICD10-I70.0). Electronically Signed   By: Marijo Conception M.D.   On: 02/24/2019 14:09   Dg Chest 2 View  Result Date: 03/09/2019 CLINICAL DATA:  Left-sided recurrent pleural effusion. EXAM: CHEST - 2 VIEW COMPARISON:  02/24/2019. FINDINGS: Prior CABG. Heart size normal. Mild left base atelectasis. Small left-sided pleural effusion, new from prior exam. No pneumothorax. Degenerative change thoracic spine and both shoulders. IMPRESSION: 1.  Small left pleural effusion, new from prior exam. 2.  Mild left base  atelectasis. 3.  Prior CABG.  Heart size stable. Electronically Signed   By: Marcello Moores  Register   On: 03/09/2019 08:33   Dg Chest 2 View  Result Date: 02/21/2019 CLINICAL DATA:  Pleural effusion EXAM: CHEST - 2 VIEW COMPARISON:  Feb 04, 2019. FINDINGS: There is a fairly small left effusion which appears slightly larger compared to most recent study. There is consolidation as well in the left base. Lungs elsewhere are clear. Heart size and pulmonary vascularity are normal. No adenopathy. Patient is status post coronary artery bypass grafting. There is aortic atherosclerosis. There is degenerative change in each shoulder as well as in the thoracic spine. IMPRESSION: Fairly small left pleural effusion, slightly larger than on study from 2 weeks prior with associated left base atelectasis/consolidation. Lungs elsewhere clear. Heart size normal. Postoperative changes noted. Aortic Atherosclerosis (ICD10-I70.0). Electronically Signed   By: Lowella Grip III M.D.   On: 02/21/2019 13:58   Ct Chest Wo Contrast  Result Date: 03/14/2019 CLINICAL DATA:  Chest pain.  Left pleural effusion. EXAM: CT CHEST WITHOUT CONTRAST TECHNIQUE: Multidetector CT imaging of the chest was performed following the standard protocol without IV contrast. COMPARISON:  10/24/2018. FINDINGS: Cardiovascular: The heart size is normal. No substantial pericardial effusion. Coronary artery calcification is evident. Patient is status post CABG. Atherosclerotic calcification is noted in the wall of the thoracic aorta. Ascending thoracic aorta measures 4.2 cm diameter. Mediastinum/Nodes: 2.1 x 3.5 x 3.8 cm lesion is identified in the mediastinum, wedged between the left atrium in the esophagus and apparently arising from the pericardium (see image 107 of sagittal series 6). This lesion has low attenuation and is probably complex fluid. No other evidence of mediastinal or hilar lymphadenopathy on this noncontrast study. There is no hilar  lymphadenopathy. The esophagus has normal imaging features. Lungs/Pleura: Centrilobular emphsyema noted. Biapical pleuroparenchymal scarring noted. Tiny right upper lobe calcified granuloma. 4 mm subpleural right middle lobe nodule (109/8) is unchanged since an exam of 06/24/2011, consistent with benign etiology. Trace atelectasis noted posterior right lower lobe with evidence of small airway impaction. No suspicious pulmonary nodule or mass in the aerated portions of the left lung. There is some lingular and left lower lobe collapse/consolidation. Small left pleural effusion again noted. Upper Abdomen: Unremarkable Musculoskeletal: No worrisome lytic or sclerotic osseous abnormality. IMPRESSION: 1. Left lower lobe collapse/consolidation with small left pleural effusion. There is some trace collapse/consolidative change in the lingula. 2. New 2.1 x 3.5 x 3.8 cm rim enhancing lesion in the posterior pericardium, between the left atrium in the esophagus. This is relatively low attenuation and may represent loculated complex pericardial fluid.  Endoscopic ultrasound would likely prove helpful to further evaluate as clinically warranted. 3. Ascending thoracic aorta is stable at 4.2 cm diameter. 4.  Emphysema. (ICD10-J43.9) 5.  Aortic Atherosclerois (ICD10-170.0) Electronically Signed   By: Misty Stanley M.D.   On: 03/14/2019 13:00   Ir Thoracentesis Asp Pleural Space W/img Guide  Result Date: 02/24/2019 INDICATION: Patient with history of dyspnea and recurrent left-sided pleural effusion. Request is made for diagnostic and therapeutic left thoracentesis. EXAM: ULTRASOUND GUIDED DIAGNOSTIC AND THERAPEUTIC LEFT THORACENTESIS MEDICATIONS: 10 mL 1% lidocaine COMPLICATIONS: None immediate. PROCEDURE: An ultrasound guided thoracentesis was thoroughly discussed with the patient and questions answered. The benefits, risks, alternatives and complications were also discussed. The patient understands and wishes to proceed with  the procedure. Written consent was obtained. Ultrasound was performed to localize and mark an adequate pocket of fluid in the left chest. The area was then prepped and draped in the normal sterile fashion. 1% Lidocaine was used for local anesthesia. Under ultrasound guidance a 6 Fr Safe-T-Centesis catheter was introduced. Thoracentesis was performed. The catheter was removed and a dressing applied. FINDINGS: A total of approximately 1.3 L of clear gold fluid was removed. Samples were sent to the laboratory as requested by the clinical team. IMPRESSION: Successful ultrasound guided left thoracentesis yielding 1.3 L of pleural fluid. Read by: Earley Abide, PA-C Electronically Signed   By: Jerilynn Mages.  Shick M.D.   On: 02/24/2019 14:14     I have independently reviewed the above radiology studies  and reviewed the findings with the patient.   Recent Lab Findings: Lab Results  Component Value Date   WBC 6.5 03/08/2019   HGB 14.8 03/08/2019   HCT 45.1 03/08/2019   PLT 187.0 03/08/2019   GLUCOSE 103 (H) 03/21/2019   CHOL 96 11/09/2018   TRIG 75 11/09/2018   HDL 29 (L) 11/09/2018   LDLCALC 52 11/09/2018   ALT 12 01/10/2019   AST 15 01/10/2019   NA 143 03/21/2019   K 4.1 03/21/2019   CL 107 03/21/2019   CREATININE 1.21 03/21/2019   BUN 18 03/21/2019   CO2 27 03/21/2019   TSH 6.979 (H) 11/22/2018   INR 1.16 10/21/2018   HGBA1C (H) 12/06/2010    5.8 (NOTE)                                                                       According to the ADA Clinical Practice Recommendations for 2011, when HbA1c is used as a screening test:   >=6.5%   Diagnostic of Diabetes Mellitus           (if abnormal result  is confirmed)  5.7-6.4%   Increased risk of developing Diabetes Mellitus  References:Diagnosis and Classification of Diabetes Mellitus,Diabetes BOFB,5102,58(NIDPO 1):S62-S69 and Standards of Medical Care in         Diabetes - 2011,Diabetes Care,2011,34  (Suppl 1):S11-S61.   ECHO: Procedure:  Transesophageal Echo  Indications:     Left atrial mass   History:         Patient has prior history of Echocardiogram examinations, most                  recent 10/19/2018. CHF CAD Atrial Fibrillation Aortic Valve  Disease Risk Factors: Hypertension and Sleep Apnea.   Sonographer:     Clayton Lefort RDCS (AE) Referring Phys:  [place] Diagnosing Phys: [name] MD     PROCEDURE: The transesophogeal probe was passed through the esophogus of the patient. The patient developed no complications during the procedure.  IMPRESSIONS    1. The left ventricle has low normal systolic function, with an ejection fraction of 50-55%. The cavity size was normal. There is mildly increased left ventricular wall thickness. There was a basal inferolateral aneurysm.  2. The aortic valve is tricuspid Moderate calcification of the aortic valve. Mild stenosis of the aortic valve.  3. There is moderate mitral annular calcification present. No evidence of mitral valve stenosis. Trivial mitral regurgitation.  4. Left atrial size was mildly dilated.  5. No evidence of a thrombus present in the left atrial appendage.  6. No PFO or ASD by color doppler.  7. Pleural effusion noted.  8. There appeared to be a loculated pericardial effusion posterior to the left atrium with invagination of the left atrial free wall (this was the structure seen on prior echo).  9. Normal caliber descending thoracic aorta with grade 3-4 plaque. 10. The right ventricle has normal systolc function. The cavity was normal.  FINDINGS  Left Ventricle: The left ventricle has low normal systolic function, with an ejection fraction of 50-55%. The cavity size was normal. There is mildly increased left ventricular wall thickness.  Right Ventricle: The right ventricle has normal systolic function. The cavity was normal.  Left Atrium: Left atrial size was mildly dilated.   Left Atrial Appendage: No  evidence of a thrombus present in the left atrial appendage.  Right Atrium: Right atrial size was normal in size. Right atrial pressure is estimated at 10 mmHg.  Interatrial Septum: No atrial level shunt detected by color flow Doppler.  Pericardium: There appeared to be a loculated pericardial effusion posterior to the left atrium with invagination of the left atrial free wall (this was the structure seen on prior echo). Pleural effusion noted.  Mitral Valve: There is moderate mitral annular calcification present. Mitral valve regurgitation is trivial by color flow Doppler. No evidence of mitral valve stenosis.  Tricuspid Valve: The tricuspid valve was normal in structure. Tricuspid valve regurgitation was not visualized by color flow Doppler.  Aortic Valve: The aortic valve is tricuspid Moderate calcification of the aortic valve. Aortic valve regurgitation was not visualized by color flow Doppler. There is Mild stenosis of the aortic valve.  Aorta: Normal caliber descending thoracic aorta with grade 3-4 plaque.    [name] MD Electronically signed by [name] MD Signature Date/Time: 03/24/2019/10:24:58 AM      Assessment / Plan:     recurrent left pleural effusion, of 1- 1.3 l size  unknown etiology but persistent,  did not originally with left pneumothorax.   Follow-up CT scan shows new mass between the left atrium and the esophagus- New 2.1 x 3.5 x 3.8 cm rim enhancing lesion in the posterior pericardium, between the left atrium in the esophagus. Now evaluated with TEE and preop cardiology clearance by Dr Aundra Dubin.  I discussed the treatment options.  Plan to proceed with bronchoscopy left video-assisted thoracoscopy drainage of effusion possible talc pleurodesis and placement of Pleurx catheter.  In addition pericardial window through the left VATS if technically safe we will plan also.       Grace Isaac MD      [place]  [place] [address] Office [phone]   Beeper [phone]  04/09/2019 7:42 AM

## 2019-04-11 ENCOUNTER — Other Ambulatory Visit: Payer: Self-pay

## 2019-04-11 ENCOUNTER — Encounter (HOSPITAL_COMMUNITY): Payer: Self-pay | Admitting: *Deleted

## 2019-04-11 NOTE — Progress Notes (Signed)
Denies chest pain. States shob is at baseline. Cardiologist Dr. Aundra Dubin. Spoke to [place], RN due to Dr. Servando Snare reporting that he want cardiac clearance. Per Thurmond Butts Dr. Servando Snare spoke to Dr. Aundra Dubin. Patient reports last dose of eliquis on 04/08/2019

## 2019-04-11 NOTE — Anesthesia Preprocedure Evaluation (Addendum)
Anesthesia Evaluation  Patient identified by MRN, date of birth, ID band Patient awake    Reviewed: Allergy & Precautions, NPO status , Patient's Chart, lab work & pertinent test results  Airway Mallampati: II  TM Distance: >3 FB Neck ROM: Full    Dental no notable dental hx. (+) Teeth Intact   Pulmonary asthma , sleep apnea , pneumonia, former smoker,    Pulmonary exam normal breath sounds clear to auscultation       Cardiovascular hypertension, + CAD and + Past MI  Normal cardiovascular exam Rhythm:Regular Rate:Normal  NSTEMI 10/11/18    7/2?20 Echo  Left Ventricle: The left ventricle has low normal systolic function, with an ejection fraction of 50-55%.   Neuro/Psych  Neuromuscular disease    GI/Hepatic   Endo/Other    Renal/GU      Musculoskeletal  (+) Arthritis ,   Abdominal   Peds  Hematology   Anesthesia Other Findings   Reproductive/Obstetrics                          Anesthesia Physical Anesthesia Plan  ASA: III  Anesthesia Plan: General   Post-op Pain Management:    Induction: Intravenous  PONV Risk Score and Plan: 3 and Treatment may vary due to age or medical condition, Ondansetron and Dexamethasone  Airway Management Planned: Double Lumen EBT and Oral ETT  Additional Equipment: Arterial line and CVP  Intra-op Plan:   Post-operative Plan: Extubation in OR  Informed Consent: I have reviewed the patients History and Physical, chart, labs and discussed the procedure including the risks, benefits and alternatives for the proposed anesthesia with the patient or authorized representative who has indicated his/her understanding and acceptance.     Dental advisory given  Plan Discussed with: CRNA, Surgeon and Anesthesiologist  Anesthesia Plan Comments: (Follows with cardiology for CAD s/p CABG 2012. Recent NSTEMI 10/11/18, LHC showed new occlusion of the branch of  SVG-ramus and OM that touched down on OM.  There were also serial 70% stenoses in the mid/distal RCA.  FFR of RCA was negative. No interventional target. Medical therapy recommended. Chronic dull left-sided chest pain thought to be related to the left lung pleural disease.   On 01/10/19 he was admitted with left spontaneous PTX requiring chest tube.  After this, he developed recurrent left pleural effusions requiring thoracentesis x 3 so far. TEE 03/24/19 showed 2.1 x 3.5 x 3.8 cm rim enhancing lesion in the posterior pericardium, between the left atrium in the esophagus. Cleared for surgery by Dr. Aundra Dubin "Spontaneous left PTX with recurrent left-sided pleural effusion:  Plan for VATS, biopsy, and possible talc pleurodesis by Dr. Servando Snare.  - I think he is of acceptable risk for this procedure.  I will start him back on a low dose of Coreg, 3.125 mg bid.")     Anesthesia Quick Evaluation

## 2019-04-12 ENCOUNTER — Encounter (HOSPITAL_COMMUNITY): Payer: Self-pay | Admitting: Certified Registered"

## 2019-04-12 ENCOUNTER — Inpatient Hospital Stay (HOSPITAL_COMMUNITY): Payer: Medicare Other | Admitting: Physician Assistant

## 2019-04-12 ENCOUNTER — Inpatient Hospital Stay (HOSPITAL_COMMUNITY)
Admission: RE | Admit: 2019-04-12 | Discharge: 2019-04-16 | DRG: 167 | Disposition: A | Discharge status: Home / Self Care | Payer: Medicare Other | Attending: Cardiothoracic Surgery | Admitting: Cardiothoracic Surgery

## 2019-04-12 ENCOUNTER — Inpatient Hospital Stay (HOSPITAL_COMMUNITY): Payer: Medicare Other

## 2019-04-12 ENCOUNTER — Other Ambulatory Visit: Payer: Self-pay

## 2019-04-12 ENCOUNTER — Encounter (HOSPITAL_COMMUNITY)
Admission: RE | Disposition: A | Discharge status: Home / Self Care | Payer: Self-pay | Source: Home / Self Care | Attending: Cardiothoracic Surgery

## 2019-04-12 DIAGNOSIS — Z87891 Personal history of nicotine dependence: Secondary | ICD-10-CM

## 2019-04-12 DIAGNOSIS — I083 Combined rheumatic disorders of mitral, aortic and tricuspid valves: Secondary | ICD-10-CM | POA: Diagnosis not present

## 2019-04-12 DIAGNOSIS — Z4682 Encounter for fitting and adjustment of non-vascular catheter: Secondary | ICD-10-CM

## 2019-04-12 DIAGNOSIS — I4891 Unspecified atrial fibrillation: Secondary | ICD-10-CM | POA: Diagnosis not present

## 2019-04-12 DIAGNOSIS — J90 Pleural effusion, not elsewhere classified: Secondary | ICD-10-CM | POA: Diagnosis not present

## 2019-04-12 DIAGNOSIS — G4730 Sleep apnea, unspecified: Secondary | ICD-10-CM | POA: Diagnosis present

## 2019-04-12 DIAGNOSIS — J9383 Other pneumothorax: Secondary | ICD-10-CM | POA: Diagnosis present

## 2019-04-12 DIAGNOSIS — D62 Acute posthemorrhagic anemia: Secondary | ICD-10-CM | POA: Diagnosis not present

## 2019-04-12 DIAGNOSIS — I714 Abdominal aortic aneurysm, without rupture: Secondary | ICD-10-CM | POA: Diagnosis not present

## 2019-04-12 DIAGNOSIS — Z951 Presence of aortocoronary bypass graft: Secondary | ICD-10-CM

## 2019-04-12 DIAGNOSIS — Z7901 Long term (current) use of anticoagulants: Secondary | ICD-10-CM

## 2019-04-12 DIAGNOSIS — I110 Hypertensive heart disease with heart failure: Secondary | ICD-10-CM | POA: Diagnosis present

## 2019-04-12 DIAGNOSIS — I071 Rheumatic tricuspid insufficiency: Secondary | ICD-10-CM | POA: Diagnosis not present

## 2019-04-12 DIAGNOSIS — J939 Pneumothorax, unspecified: Secondary | ICD-10-CM | POA: Diagnosis not present

## 2019-04-12 DIAGNOSIS — Z9689 Presence of other specified functional implants: Secondary | ICD-10-CM

## 2019-04-12 DIAGNOSIS — I509 Heart failure, unspecified: Secondary | ICD-10-CM | POA: Diagnosis present

## 2019-04-12 DIAGNOSIS — Z888 Allergy status to other drugs, medicaments and biological substances status: Secondary | ICD-10-CM | POA: Diagnosis not present

## 2019-04-12 DIAGNOSIS — I2510 Atherosclerotic heart disease of native coronary artery without angina pectoris: Secondary | ICD-10-CM | POA: Diagnosis not present

## 2019-04-12 DIAGNOSIS — Z8249 Family history of ischemic heart disease and other diseases of the circulatory system: Secondary | ICD-10-CM

## 2019-04-12 DIAGNOSIS — M1990 Unspecified osteoarthritis, unspecified site: Secondary | ICD-10-CM | POA: Diagnosis not present

## 2019-04-12 DIAGNOSIS — I454 Nonspecific intraventricular block: Secondary | ICD-10-CM | POA: Diagnosis not present

## 2019-04-12 DIAGNOSIS — J95811 Postprocedural pneumothorax: Secondary | ICD-10-CM | POA: Diagnosis not present

## 2019-04-12 DIAGNOSIS — R0602 Shortness of breath: Secondary | ICD-10-CM | POA: Diagnosis not present

## 2019-04-12 DIAGNOSIS — I10 Essential (primary) hypertension: Secondary | ICD-10-CM | POA: Diagnosis not present

## 2019-04-12 DIAGNOSIS — Z881 Allergy status to other antibiotic agents status: Secondary | ICD-10-CM | POA: Diagnosis not present

## 2019-04-12 DIAGNOSIS — R899 Unspecified abnormal finding in specimens from other organs, systems and tissues: Secondary | ICD-10-CM | POA: Diagnosis not present

## 2019-04-12 HISTORY — PX: PLEURAL EFFUSION DRAINAGE: SHX5099

## 2019-04-12 HISTORY — PX: THORACOSCOPY: SUR1347

## 2019-04-12 HISTORY — DX: Unspecified atrial fibrillation: I48.91

## 2019-04-12 HISTORY — DX: Acute myocardial infarction, unspecified: I21.9

## 2019-04-12 HISTORY — PX: VIDEO BRONCHOSCOPY: SHX5072

## 2019-04-12 HISTORY — PX: TALC PLEURODESIS: SHX2506

## 2019-04-12 HISTORY — DX: Heart failure, unspecified: I50.9

## 2019-04-12 HISTORY — PX: PLEURAL BIOPSY: SHX5082

## 2019-04-12 HISTORY — PX: VIDEO ASSISTED THORACOSCOPY: SHX5073

## 2019-04-12 HISTORY — PX: TEE WITHOUT CARDIOVERSION: SHX5443

## 2019-04-12 LAB — GRAM STAIN

## 2019-04-12 LAB — BLOOD GAS, ARTERIAL
Acid-Base Excess: 4 mmol/L — ABNORMAL HIGH (ref 0.0–2.0)
Bicarbonate: 28.2 mmol/L — ABNORMAL HIGH (ref 20.0–28.0)
Drawn by: 470591
FIO2: 21
O2 Saturation: 98.5 %
Patient temperature: 98.6
pCO2 arterial: 43.7 mmHg (ref 32.0–48.0)
pH, Arterial: 7.425 (ref 7.350–7.450)
pO2, Arterial: 127 mmHg — ABNORMAL HIGH (ref 83.0–108.0)

## 2019-04-12 LAB — COMPREHENSIVE METABOLIC PANEL
ALT: 12 U/L (ref 0–44)
AST: 19 U/L (ref 15–41)
Albumin: 3.8 g/dL (ref 3.5–5.0)
Alkaline Phosphatase: 93 U/L (ref 38–126)
Anion gap: 11 (ref 5–15)
BUN: 24 mg/dL — ABNORMAL HIGH (ref 8–23)
CO2: 25 mmol/L (ref 22–32)
Calcium: 9.2 mg/dL (ref 8.9–10.3)
Chloride: 105 mmol/L (ref 98–111)
Creatinine, Ser: 1.34 mg/dL — ABNORMAL HIGH (ref 0.61–1.24)
GFR calc Af Amer: 56 mL/min — ABNORMAL LOW (ref >60)
GFR calc non Af Amer: 48 mL/min — ABNORMAL LOW (ref >60)
Glucose, Bld: 92 mg/dL (ref 70–99)
Potassium: 3.6 mmol/L (ref 3.5–5.1)
Sodium: 141 mmol/L (ref 135–145)
Total Bilirubin: 1 mg/dL (ref 0.3–1.2)
Total Protein: 6.8 g/dL (ref 6.5–8.1)

## 2019-04-12 LAB — URINALYSIS, ROUTINE W REFLEX MICROSCOPIC
Bilirubin Urine: NEGATIVE
Glucose, UA: NEGATIVE mg/dL
Hgb urine dipstick: NEGATIVE
Ketones, ur: NEGATIVE mg/dL
Nitrite: NEGATIVE
Protein, ur: NEGATIVE mg/dL
Specific Gravity, Urine: 1.008 (ref 1.005–1.030)
pH: 6 (ref 5.0–8.0)

## 2019-04-12 LAB — PROTIME-INR
INR: 1.1 (ref 0.8–1.2)
Prothrombin Time: 14 seconds (ref 11.4–15.2)

## 2019-04-12 LAB — CBC
HCT: 47.9 % (ref 39.0–52.0)
Hemoglobin: 15.3 g/dL (ref 13.0–17.0)
MCH: 31.3 pg (ref 26.0–34.0)
MCHC: 31.9 g/dL (ref 30.0–36.0)
MCV: 98 fL (ref 80.0–100.0)
Platelets: 153 10*3/uL (ref 150–400)
RBC: 4.89 MIL/uL (ref 4.22–5.81)
RDW: 14.7 % (ref 11.5–15.5)
WBC: 7.3 10*3/uL (ref 4.0–10.5)
nRBC: 0 % (ref 0.0–0.2)

## 2019-04-12 LAB — BODY FLUID CELL COUNT WITH DIFFERENTIAL
Eos, Fluid: 2 %
Lymphs, Fluid: 64 %
Monocyte-Macrophage-Serous Fluid: 32 % — ABNORMAL LOW (ref 50–90)
Neutrophil Count, Fluid: 2 % (ref 0–25)
Total Nucleated Cell Count, Fluid: 1095 cu mm — ABNORMAL HIGH (ref 0–1000)

## 2019-04-12 LAB — GLUCOSE, CAPILLARY
Glucose-Capillary: 102 mg/dL — ABNORMAL HIGH (ref 70–99)
Glucose-Capillary: 169 mg/dL — ABNORMAL HIGH (ref 70–99)
Glucose-Capillary: 181 mg/dL — ABNORMAL HIGH (ref 70–99)
Glucose-Capillary: 182 mg/dL — ABNORMAL HIGH (ref 70–99)

## 2019-04-12 LAB — PROTEIN, PLEURAL OR PERITONEAL FLUID: Total protein, fluid: 4.1 g/dL

## 2019-04-12 LAB — APTT: aPTT: 29 seconds (ref 24–36)

## 2019-04-12 LAB — ECHO INTRAOPERATIVE TEE
Height: 75 in
Weight: 3264 oz

## 2019-04-12 LAB — TYPE AND SCREEN
ABO/RH(D): A POS
Antibody Screen: NEGATIVE

## 2019-04-12 LAB — LACTATE DEHYDROGENASE, PLEURAL OR PERITONEAL FLUID: LD, Fluid: 98 U/L — ABNORMAL HIGH (ref 3–23)

## 2019-04-12 LAB — GLUCOSE, PLEURAL OR PERITONEAL FLUID: Glucose, Fluid: 96 mg/dL

## 2019-04-12 SURGERY — BRONCHOSCOPY, VIDEO-ASSISTED
Anesthesia: General | Site: Chest

## 2019-04-12 MED ORDER — DIPHENHYDRAMINE HCL 50 MG/ML IJ SOLN: 12.5000 mg | Freq: Four times a day (QID) | INTRAMUSCULAR | Status: DC | PRN

## 2019-04-12 MED ORDER — PROPOFOL 10 MG/ML IV BOLUS
INTRAVENOUS | Status: DC | PRN
  Administered 2019-04-12: 40 mg via INTRAVENOUS
  Administered 2019-04-12: 90 mg via INTRAVENOUS

## 2019-04-12 MED ORDER — TALC (STERITALC) POWDER FOR INTRAPLEURAL USE
INTRAPLEURAL | Status: AC
  Filled 2019-04-12: qty 4

## 2019-04-12 MED ORDER — PANTOPRAZOLE SODIUM 40 MG PO TBEC
40.0000 mg | DELAYED_RELEASE_TABLET | Freq: Every day | ORAL | Status: DC
  Administered 2019-04-13 – 2019-04-16 (×4): 40 mg via ORAL
  Filled 2019-04-12 (×4): qty 1

## 2019-04-12 MED ORDER — VANCOMYCIN HCL IN DEXTROSE 1-5 GM/200ML-% IV SOLN
1000.0000 mg | INTRAVENOUS | Status: DC
  Filled 2019-04-12: qty 200

## 2019-04-12 MED ORDER — ATORVASTATIN CALCIUM 80 MG PO TABS
80.0000 mg | ORAL_TABLET | Freq: Every day | ORAL | Status: DC
  Administered 2019-04-13 – 2019-04-16 (×4): 80 mg via ORAL
  Filled 2019-04-12 (×4): qty 1

## 2019-04-12 MED ORDER — PROPOFOL 10 MG/ML IV BOLUS
INTRAVENOUS | Status: AC
  Filled 2019-04-12: qty 20

## 2019-04-12 MED ORDER — VANCOMYCIN HCL 1000 MG IV SOLR
INTRAVENOUS | Status: DC | PRN
  Administered 2019-04-12: 1000 mg via INTRAVENOUS

## 2019-04-12 MED ORDER — SENNOSIDES-DOCUSATE SODIUM 8.6-50 MG PO TABS
1.0000 | ORAL_TABLET | Freq: Every day | ORAL | Status: DC
  Administered 2019-04-12 – 2019-04-15 (×4): 1 via ORAL
  Filled 2019-04-12 (×4): qty 1

## 2019-04-12 MED ORDER — PHENYLEPHRINE 40 MCG/ML (10ML) SYRINGE FOR IV PUSH (FOR BLOOD PRESSURE SUPPORT)
PREFILLED_SYRINGE | INTRAVENOUS | Status: AC
  Filled 2019-04-12: qty 10

## 2019-04-12 MED ORDER — SODIUM CHLORIDE 0.9 % IV SOLN
INTRAVENOUS | Status: DC | PRN
  Administered 2019-04-12: 08:00:00 40 ug/min via INTRAVENOUS

## 2019-04-12 MED ORDER — ACETAMINOPHEN 160 MG/5ML PO SOLN: 1000.0000 mg | Freq: Four times a day (QID) | ORAL | Status: DC

## 2019-04-12 MED ORDER — FENTANYL 40 MCG/ML IV SOLN
INTRAVENOUS | Status: DC
  Administered 2019-04-12: 30 ug via INTRAVENOUS
  Administered 2019-04-12: 90 ug via INTRAVENOUS
  Administered 2019-04-12: 30 ug via INTRAVENOUS
  Administered 2019-04-12: 11:00:00 via INTRAVENOUS
  Administered 2019-04-13: 10 ug via INTRAVENOUS
  Filled 2019-04-12 (×2): qty 25

## 2019-04-12 MED ORDER — DEXAMETHASONE SODIUM PHOSPHATE 10 MG/ML IJ SOLN
INTRAMUSCULAR | Status: DC | PRN
  Administered 2019-04-12: 8 mg via INTRAVENOUS

## 2019-04-12 MED ORDER — ACETAMINOPHEN 500 MG PO TABS
1000.0000 mg | ORAL_TABLET | Freq: Four times a day (QID) | ORAL | Status: DC
  Administered 2019-04-12 – 2019-04-16 (×14): 1000 mg via ORAL
  Filled 2019-04-12 (×14): qty 2

## 2019-04-12 MED ORDER — EPINEPHRINE PF 1 MG/ML IJ SOLN
INTRAMUSCULAR | Status: AC
  Filled 2019-04-12: qty 1

## 2019-04-12 MED ORDER — SUCCINYLCHOLINE CHLORIDE 200 MG/10ML IV SOSY
PREFILLED_SYRINGE | INTRAVENOUS | Status: AC
  Filled 2019-04-12: qty 10

## 2019-04-12 MED ORDER — EPHEDRINE SULFATE-NACL 50-0.9 MG/10ML-% IV SOSY
PREFILLED_SYRINGE | INTRAVENOUS | Status: DC | PRN
  Administered 2019-04-12 (×3): 5 mg via INTRAVENOUS

## 2019-04-12 MED ORDER — ONDANSETRON HCL 4 MG/2ML IJ SOLN: 4.0000 mg | Freq: Once | INTRAMUSCULAR | Status: DC | PRN

## 2019-04-12 MED ORDER — FENTANYL CITRATE (PF) 100 MCG/2ML IJ SOLN: 25.0000 ug | INTRAMUSCULAR | Status: DC | PRN

## 2019-04-12 MED ORDER — FENTANYL CITRATE (PF) 100 MCG/2ML IJ SOLN
INTRAMUSCULAR | Status: AC
  Filled 2019-04-12: qty 2

## 2019-04-12 MED ORDER — TRAMADOL HCL 50 MG PO TABS
50.0000 mg | ORAL_TABLET | Freq: Four times a day (QID) | ORAL | Status: DC | PRN
  Administered 2019-04-15 – 2019-04-16 (×3): 100 mg via ORAL
  Filled 2019-04-12 (×3): qty 2

## 2019-04-12 MED ORDER — FENTANYL CITRATE (PF) 100 MCG/2ML IJ SOLN
INTRAMUSCULAR | Status: DC | PRN
  Administered 2019-04-12: 100 ug via INTRAVENOUS
  Administered 2019-04-12 (×2): 50 ug via INTRAVENOUS

## 2019-04-12 MED ORDER — ONDANSETRON HCL 4 MG/2ML IJ SOLN
INTRAMUSCULAR | Status: AC
  Filled 2019-04-12: qty 2

## 2019-04-12 MED ORDER — DEXAMETHASONE SODIUM PHOSPHATE 10 MG/ML IJ SOLN
INTRAMUSCULAR | Status: AC
  Filled 2019-04-12: qty 1

## 2019-04-12 MED ORDER — TALC 5 G PL SUSR
INTRAPLEURAL | Status: DC | PRN
  Administered 2019-04-12: 4 g via INTRAPLEURAL

## 2019-04-12 MED ORDER — SUGAMMADEX SODIUM 200 MG/2ML IV SOLN
INTRAVENOUS | Status: DC | PRN
  Administered 2019-04-12: 200 mg via INTRAVENOUS

## 2019-04-12 MED ORDER — ACETAMINOPHEN 10 MG/ML IV SOLN: 1000.0000 mg | Freq: Once | INTRAVENOUS | Status: DC | PRN

## 2019-04-12 MED ORDER — DIPHENHYDRAMINE HCL 12.5 MG/5ML PO ELIX: 12.5000 mg | ORAL_SOLUTION | Freq: Four times a day (QID) | ORAL | Status: DC | PRN

## 2019-04-12 MED ORDER — FENTANYL CITRATE (PF) 250 MCG/5ML IJ SOLN
INTRAMUSCULAR | Status: AC
  Filled 2019-04-12: qty 5

## 2019-04-12 MED ORDER — OXYCODONE HCL 5 MG PO TABS
5.0000 mg | ORAL_TABLET | ORAL | Status: DC | PRN
  Administered 2019-04-15 (×2): 5 mg via ORAL
  Filled 2019-04-12 (×2): qty 1

## 2019-04-12 MED ORDER — 0.9 % SODIUM CHLORIDE (POUR BTL) OPTIME
TOPICAL | Status: DC | PRN
  Administered 2019-04-12: 2000 mL

## 2019-04-12 MED ORDER — ROCURONIUM BROMIDE 50 MG/5ML IV SOSY
PREFILLED_SYRINGE | INTRAVENOUS | Status: DC | PRN
  Administered 2019-04-12: 50 mg via INTRAVENOUS
  Administered 2019-04-12: 20 mg via INTRAVENOUS

## 2019-04-12 MED ORDER — BUPIVACAINE HCL (PF) 0.5 % IJ SOLN
INTRAMUSCULAR | Status: AC
  Filled 2019-04-12: qty 30

## 2019-04-12 MED ORDER — GLYCOPYRROLATE 0.2 MG/ML IJ SOLN
INTRAMUSCULAR | Status: DC | PRN
  Administered 2019-04-12: 0.1 mg via INTRAVENOUS

## 2019-04-12 MED ORDER — BISACODYL 5 MG PO TBEC
10.0000 mg | DELAYED_RELEASE_TABLET | Freq: Every day | ORAL | Status: DC
  Administered 2019-04-12 – 2019-04-16 (×5): 10 mg via ORAL
  Filled 2019-04-12 (×5): qty 2

## 2019-04-12 MED ORDER — VANCOMYCIN HCL IN DEXTROSE 1-5 GM/200ML-% IV SOLN
1000.0000 mg | Freq: Two times a day (BID) | INTRAVENOUS | Status: AC
  Administered 2019-04-12: 1000 mg via INTRAVENOUS
  Filled 2019-04-12: qty 200

## 2019-04-12 MED ORDER — ONDANSETRON HCL 4 MG/2ML IJ SOLN: 4.0000 mg | Freq: Four times a day (QID) | INTRAMUSCULAR | Status: DC | PRN

## 2019-04-12 MED ORDER — LIDOCAINE 2% (20 MG/ML) 5 ML SYRINGE
INTRAMUSCULAR | Status: AC
  Filled 2019-04-12: qty 5

## 2019-04-12 MED ORDER — TAMSULOSIN HCL 0.4 MG PO CAPS
0.4000 mg | ORAL_CAPSULE | Freq: Every day | ORAL | Status: DC
  Administered 2019-04-13 – 2019-04-15 (×3): 0.4 mg via ORAL
  Filled 2019-04-12 (×3): qty 1

## 2019-04-12 MED ORDER — NALOXONE HCL 0.4 MG/ML IJ SOLN: 0.4000 mg | INTRAMUSCULAR | Status: DC | PRN

## 2019-04-12 MED ORDER — ENOXAPARIN SODIUM 40 MG/0.4ML ~~LOC~~ SOLN
40.0000 mg | SUBCUTANEOUS | Status: DC
  Administered 2019-04-13 – 2019-04-15 (×3): 40 mg via SUBCUTANEOUS
  Filled 2019-04-12 (×4): qty 0.4

## 2019-04-12 MED ORDER — DEXTROSE-NACL 5-0.45 % IV SOLN
INTRAVENOUS | Status: DC
  Administered 2019-04-12 – 2019-04-13 (×3): via INTRAVENOUS

## 2019-04-12 MED ORDER — LIDOCAINE 2% (20 MG/ML) 5 ML SYRINGE
INTRAMUSCULAR | Status: DC | PRN
  Administered 2019-04-12: 100 mg via INTRAVENOUS

## 2019-04-12 MED ORDER — INSULIN ASPART 100 UNIT/ML ~~LOC~~ SOLN
0.0000 [IU] | SUBCUTANEOUS | Status: DC
  Administered 2019-04-12 (×3): 4 [IU] via SUBCUTANEOUS
  Administered 2019-04-13: 04:00:00 2 [IU] via SUBCUTANEOUS
  Filled 2019-04-12: qty 0.24

## 2019-04-12 MED ORDER — PHENYLEPHRINE 40 MCG/ML (10ML) SYRINGE FOR IV PUSH (FOR BLOOD PRESSURE SUPPORT)
PREFILLED_SYRINGE | INTRAVENOUS | Status: DC | PRN
  Administered 2019-04-12: 80 ug via INTRAVENOUS
  Administered 2019-04-12: 120 ug via INTRAVENOUS
  Administered 2019-04-12: 80 ug via INTRAVENOUS

## 2019-04-12 MED ORDER — LIDOCAINE HCL (PF) 1 % IJ SOLN
INTRAMUSCULAR | Status: AC
  Filled 2019-04-12: qty 30

## 2019-04-12 MED ORDER — GLYCOPYRROLATE PF 0.2 MG/ML IJ SOSY
PREFILLED_SYRINGE | INTRAMUSCULAR | Status: AC
  Filled 2019-04-12: qty 1

## 2019-04-12 MED ORDER — ONDANSETRON HCL 4 MG/2ML IJ SOLN
INTRAMUSCULAR | Status: DC | PRN
  Administered 2019-04-12: 4 mg via INTRAVENOUS

## 2019-04-12 MED ORDER — FENTANYL CITRATE (PF) 100 MCG/2ML IJ SOLN
25.0000 ug | INTRAMUSCULAR | Status: DC | PRN
  Administered 2019-04-12: 50 ug via INTRAVENOUS
  Administered 2019-04-12: 25 ug via INTRAVENOUS
  Administered 2019-04-12: 50 ug via INTRAVENOUS

## 2019-04-12 MED ORDER — LACTATED RINGERS IV SOLN
INTRAVENOUS | Status: DC | PRN
  Administered 2019-04-12 (×2): via INTRAVENOUS

## 2019-04-12 MED ORDER — ROCURONIUM BROMIDE 10 MG/ML (PF) SYRINGE
PREFILLED_SYRINGE | INTRAVENOUS | Status: AC
  Filled 2019-04-12: qty 10

## 2019-04-12 MED ORDER — SODIUM CHLORIDE 0.9% FLUSH: 9.0000 mL | INTRAVENOUS | Status: DC | PRN

## 2019-04-12 MED ORDER — LEVOTHYROXINE SODIUM 50 MCG PO TABS
50.0000 ug | ORAL_TABLET | Freq: Every day | ORAL | Status: DC
  Administered 2019-04-12 – 2019-04-16 (×5): 50 ug via ORAL
  Filled 2019-04-12 (×5): qty 1

## 2019-04-12 SURGICAL SUPPLY — 113 items
ADAPTER VALVE BIOPSY EBUS (MISCELLANEOUS) IMPLANT
ADH SKN CLS APL DERMABOND .7 (GAUZE/BANDAGES/DRESSINGS) ×5
ADPTR VALVE BIOPSY EBUS (MISCELLANEOUS)
APL SRG 22X2 LUM MLBL SLNT (VASCULAR PRODUCTS)
APL SRG 7X2 LUM MLBL SLNT (VASCULAR PRODUCTS)
APPLICATOR TIP COSEAL (VASCULAR PRODUCTS) IMPLANT
APPLICATOR TIP EXT COSEAL (VASCULAR PRODUCTS) IMPLANT
BRUSH CYTOL CELLEBRITY 1.5X140 (MISCELLANEOUS) IMPLANT
BRUSH SCRUB EZ PLAIN DRY (MISCELLANEOUS) ×12 IMPLANT
CANISTER SUCT 3000ML PPV (MISCELLANEOUS) ×7 IMPLANT
CATH THORACIC 28FR (CATHETERS) ×1 IMPLANT
CATH THORACIC 28FR RT ANG (CATHETERS) IMPLANT
CATH THORACIC 36FR (CATHETERS) IMPLANT
CATH THORACIC 36FR RT ANG (CATHETERS) IMPLANT
CLEANER TIP ELECTROSURG 2X2 (MISCELLANEOUS) ×6 IMPLANT
CLIP VESOCCLUDE MED 6/CT (CLIP) IMPLANT
CONN ST 1/4X3/8  BEN (MISCELLANEOUS) ×1
CONN ST 1/4X3/8 BEN (MISCELLANEOUS) ×5 IMPLANT
CONT SPEC 4OZ CLIKSEAL STRL BL (MISCELLANEOUS) ×13 IMPLANT
COVER BACK TABLE 60X90IN (DRAPES) ×5 IMPLANT
COVER SURGICAL LIGHT HANDLE (MISCELLANEOUS) ×6 IMPLANT
COVER TRANSDUCER ULTRASND GEL (DRAPE) ×6 IMPLANT
COVER WAND RF STERILE (DRAPES) ×6 IMPLANT
DERMABOND ADVANCED (GAUZE/BANDAGES/DRESSINGS) ×1
DERMABOND ADVANCED .7 DNX12 (GAUZE/BANDAGES/DRESSINGS) ×5 IMPLANT
DRAIN CHANNEL 28F RND 3/8 FF (WOUND CARE) ×6 IMPLANT
DRAPE C-ARM 42X72 X-RAY (DRAPES) ×6 IMPLANT
DRAPE CV SPLIT W-CLR ANES SCRN (DRAPES) ×1 IMPLANT
DRAPE LAPAROSCOPIC ABDOMINAL (DRAPES) ×6 IMPLANT
DRAPE ORTHO SPLIT 77X108 STRL (DRAPES) ×6
DRAPE SLUSH/WARMER DISC (DRAPES) ×6 IMPLANT
DRAPE SURG ORHT 6 SPLT 77X108 (DRAPES) IMPLANT
DRILL BIT 7/64X5 (BIT) IMPLANT
ELECT BLADE 4.0 EZ CLEAN MEGAD (MISCELLANEOUS) ×6
ELECT REM PT RETURN 9FT ADLT (ELECTROSURGICAL) ×6
ELECTRODE BLDE 4.0 EZ CLN MEGD (MISCELLANEOUS) ×5 IMPLANT
ELECTRODE REM PT RTRN 9FT ADLT (ELECTROSURGICAL) ×5 IMPLANT
FORCEPS BIOP RJ4 1.8 (CUTTING FORCEPS) IMPLANT
GAUZE SPONGE 4X4 12PLY STRL (GAUZE/BANDAGES/DRESSINGS) ×6 IMPLANT
GAUZE SPONGE 4X4 12PLY STRL LF (GAUZE/BANDAGES/DRESSINGS) ×1 IMPLANT
GLOVE BIO SURGEON STRL SZ 6.5 (GLOVE) ×15 IMPLANT
GLOVE BIOGEL PI IND STRL 6.5 (GLOVE) IMPLANT
GLOVE BIOGEL PI INDICATOR 6.5 (GLOVE) ×2
GOWN STRL NON-REIN LRG LVL3 (GOWN DISPOSABLE) ×1 IMPLANT
GOWN STRL REUS W/ TWL LRG LVL3 (GOWN DISPOSABLE) ×15 IMPLANT
GOWN STRL REUS W/TWL LRG LVL3 (GOWN DISPOSABLE) ×24
HEMOSTAT POWDER SURGIFOAM 1G (HEMOSTASIS) IMPLANT
KIT BASIN OR (CUSTOM PROCEDURE TRAY) ×6 IMPLANT
KIT CLEAN ENDO COMPLIANCE (KITS) ×6 IMPLANT
KIT PLEURX DRAIN CATH 1000ML (MISCELLANEOUS) ×6 IMPLANT
KIT PLEURX DRAIN CATH 15.5FR (DRAIN) ×6 IMPLANT
KIT SUCTION CATH 14FR (SUCTIONS) ×6 IMPLANT
KIT TURNOVER KIT B (KITS) ×6 IMPLANT
MARKER SKIN DUAL TIP RULER LAB (MISCELLANEOUS) ×1 IMPLANT
NDL SPNL 18GX3.5 QUINCKE PK (NEEDLE) ×5 IMPLANT
NEEDLE SPNL 18GX3.5 QUINCKE PK (NEEDLE) ×6 IMPLANT
NS IRRIG 1000ML POUR BTL (IV SOLUTION) ×12 IMPLANT
OIL SILICONE PENTAX (PARTS (SERVICE/REPAIRS)) ×6 IMPLANT
PACK CHEST (CUSTOM PROCEDURE TRAY) ×6 IMPLANT
PACK GENERAL/GYN (CUSTOM PROCEDURE TRAY) ×6 IMPLANT
PAD ARMBOARD 7.5X6 YLW CONV (MISCELLANEOUS) ×12 IMPLANT
PAD ELECT DEFIB RADIOL ZOLL (MISCELLANEOUS) ×6 IMPLANT
PASSER SUT SWANSON 36MM LOOP (INSTRUMENTS) IMPLANT
SEALANT PROGEL (MISCELLANEOUS) IMPLANT
SEALANT SURG COSEAL 4ML (VASCULAR PRODUCTS) IMPLANT
SEALANT SURG COSEAL 8ML (VASCULAR PRODUCTS) IMPLANT
SET DRAINAGE LINE (MISCELLANEOUS) IMPLANT
SOLUTION ANTI FOG 6CC (MISCELLANEOUS) ×6 IMPLANT
STOPCOCK 4 WAY LG BORE MALE ST (IV SETS) ×5 IMPLANT
SUT ETHILON 3 0 FSL (SUTURE) ×6 IMPLANT
SUT PROLENE 3 0 SH DA (SUTURE) IMPLANT
SUT PROLENE 4 0 RB 1 (SUTURE)
SUT PROLENE 4-0 RB1 .5 CRCL 36 (SUTURE) IMPLANT
SUT SILK  1 MH (SUTURE) ×4
SUT SILK 1 MH (SUTURE) ×20 IMPLANT
SUT SILK 2 0SH CR/8 30 (SUTURE) IMPLANT
SUT SILK 3 0SH CR/8 30 (SUTURE) IMPLANT
SUT VIC AB 1 CTX 18 (SUTURE) ×5 IMPLANT
SUT VIC AB 1 CTX 36 (SUTURE)
SUT VIC AB 1 CTX36XBRD ANBCTR (SUTURE) IMPLANT
SUT VIC AB 2-0 CTX 27 (SUTURE) ×6 IMPLANT
SUT VIC AB 2-0 CTX 36 (SUTURE) IMPLANT
SUT VIC AB 2-0 UR6 27 (SUTURE) IMPLANT
SUT VIC AB 3-0 X1 27 (SUTURE) ×5 IMPLANT
SUT VICRYL 2 TP 1 (SUTURE) IMPLANT
SWAB COLLECTION DEVICE MRSA (MISCELLANEOUS) IMPLANT
SWAB CULTURE ESWAB REG 1ML (MISCELLANEOUS) IMPLANT
SYR 10ML LL (SYRINGE) IMPLANT
SYR 20ML ECCENTRIC (SYRINGE) ×6 IMPLANT
SYR 20ML LL LF (SYRINGE) ×1 IMPLANT
SYR 50ML LL SCALE MARK (SYRINGE) ×6 IMPLANT
SYR 50ML SLIP (SYRINGE) IMPLANT
SYR 5ML LL (SYRINGE) ×6 IMPLANT
SYR CONTROL 10ML LL (SYRINGE) ×6 IMPLANT
SYSTEM SAHARA CHEST DRAIN ATS (WOUND CARE) ×6 IMPLANT
TAPE CLOTH 4X10 WHT NS (GAUZE/BANDAGES/DRESSINGS) ×6 IMPLANT
TAPE CLOTH SURG 4X10 WHT LF (GAUZE/BANDAGES/DRESSINGS) ×1 IMPLANT
TIP APPLICATOR SPRAY EXTEND 16 (VASCULAR PRODUCTS) IMPLANT
TOWEL GREEN STERILE (TOWEL DISPOSABLE) ×8 IMPLANT
TOWEL GREEN STERILE FF (TOWEL DISPOSABLE) ×6 IMPLANT
TRAP SPECIMEN MUCOUS 40CC (MISCELLANEOUS) ×18 IMPLANT
TRAY FOLEY MTR SLVR 16FR STAT (SET/KITS/TRAYS/PACK) ×5 IMPLANT
TRAY FOLEY SLVR 16FR TEMP STAT (SET/KITS/TRAYS/PACK) ×6 IMPLANT
TROCAR XCEL 12X100 BLDLESS (ENDOMECHANICALS) IMPLANT
TROCAR XCEL BLADELESS 5X75MML (TROCAR) ×2 IMPLANT
TUBE CONNECTING 20X1/4 (TUBING) ×6 IMPLANT
TUBING EXTENTION W/L.L. (IV SETS) ×6 IMPLANT
TUNNELER SHEATH ON-Q 11GX8 DSP (PAIN MANAGEMENT) IMPLANT
VALVE BIOPSY  SINGLE USE (MISCELLANEOUS) ×1
VALVE BIOPSY SINGLE USE (MISCELLANEOUS) ×5 IMPLANT
VALVE REPLACEMENT CAP (MISCELLANEOUS) IMPLANT
VALVE SUCTION BRONCHIO DISP (MISCELLANEOUS) ×6 IMPLANT
WATER STERILE IRR 1000ML POUR (IV SOLUTION) ×12 IMPLANT

## 2019-04-12 NOTE — Transfer of Care (Signed)
Immediate Anesthesia Transfer of Care Note  Patient: Eric Gordon  Procedure(s) Performed: VIDEO BRONCHOSCOPY (N/A ) VIDEO ASSISTED THORACOSCOPY (Left Chest) TALC PLEURADESIS (Left ) Transesophageal Echocardiogram (Tee) Pleural Biopsy (Left ) Drainage Of Pleural Effusion (Left )  Patient Location: PACU  Anesthesia Type:General  Level of Consciousness: awake and patient cooperative  Airway & Oxygen Therapy: Patient Spontanous Breathing and Patient connected to face mask oxygen  Post-op Assessment: Report given to RN and Post -op Vital signs reviewed and stable  Post vital signs: Reviewed and stable  Last Vitals:  Vitals Value Taken Time  BP 146/76 04/12/19 1005  Temp    Pulse 64 04/12/19 1006  Resp 15 04/12/19 1006  SpO2 100 % 04/12/19 1006  Vitals shown include unvalidated device data.  Last Pain:  Vitals:   04/12/19 0649  TempSrc:   PainSc: 4       Patients Stated Pain Goal: 2 (72/09/47 0962)  Complications: No apparent anesthesia complications

## 2019-04-12 NOTE — H&P (Signed)
[address]       [place] [address]             [phone]                    Buck L Partington Fleming Medical Record #[id number] Date of Birth: [birth year]  Referring: Lauraine Rinne, NP Primary Care: Shon Baton, MD Primary Cardiologist: No primary care provider on file.   History of Present Illness:    Eric Gordon 85 y.o. male is seen in the office  after recent evaluation by cardiology including TEE. Previously he was  hospitalizated for spontaneous pneumothorax.  He was hospitalized in a left chest tube was placed by the pulmonary service with resolution of the pneumothorax but tube was removed and he was discharged home subsequently he has had a thoracentesis for recurrent left pleural effusion.  He is referred from the pulmonary office.  He denies fever chills or night sweats.  Previously he has never had a pneumothorax.  He did have coronary artery bypass grafting in 2012 with use of left internal mammary artery.     In January of this year he presented with non-STEMI myocardial infarction and underwent cardiac catheterization on 3 occasions over several day.  But ultimately with no intervention done  Current Activity/ Functional Status:  Patient is independent with mobility/ambulation, transfers, ADL's, IADL's.   Zubrod Score: At the time of surgery this patient's most appropriate activity status/level should be described as: []     0    Normal activity, no symptoms [x]     1    Restricted in physical strenuous activity but ambulatory, able to do out light work []     2    Ambulatory and capable of self care, unable to do work activities, up and about               >50 % of waking hours                              []     3    Only limited self care, in bed greater than 50% of waking hours []     4    Completely disabled, no self care, confined to bed or chair []     5    Moribund   Past Medical History:  Diagnosis Date  . Abdominal  aortic aneurysm (AAA) ([place])   . Arthritis    "my whole body" (03/19/2018)  . Atrial fibrillation ([place])   . CAD (coronary artery disease)   . CHF (congestive heart failure) ([place])   . Chronic anticoagulation    PE  . Chronic lower back pain   . GERD (gastroesophageal reflux disease)   . High cholesterol   . Hypertension   . Hypothyroidism   . Myocardial infarction [facility])    2 in Jan. 2019  . OSA on CPAP    uses CPAP  . Pneumonia    "now and once before" (03/19/2018)  . Pulmonary embolism [facility])    April 2012 after CABG  . S/P CABG (coronary artery bypass graft)     Past Surgical History:  Procedure Laterality Date  . CARDIAC CATHETERIZATION  11/2010  . CORONARY ANGIOGRAPHY N/A 10/22/2018   Procedure: CORONARY ANGIOGRAPHY;  Surgeon: Troy Sine, MD;  Location: [place] CV LAB;  Service: Cardiovascular;  Laterality: N/A;  . CORONARY ARTERY BYPASS GRAFT  March 2012   CABG X 5  . INGUINAL HERNIA REPAIR Left 1980  . INTRAVASCULAR PRESSURE WIRE/FFR STUDY N/A 10/22/2018   Procedure: INTRAVASCULAR PRESSURE WIRE/FFR STUDY;  Surgeon: Troy Sine, MD;  Location: [place] CV LAB;  Service: Cardiovascular;  Laterality: N/A;  . IR THORACENTESIS ASP PLEURAL SPACE W/IMG GUIDE  02/04/2019  . IR THORACENTESIS ASP PLEURAL SPACE W/IMG GUIDE  02/24/2019  . KNEE ARTHROSCOPY Left 2006  . LEFT HEART CATH AND CORS/GRAFTS ANGIOGRAPHY N/A 10/18/2018   Procedure: LEFT HEART CATH AND CORS/GRAFTS ANGIOGRAPHY;  Surgeon: Lorretta Harp, MD;  Location: [place] CV LAB;  Service: Cardiovascular;  Laterality: N/A;  . LEFT HEART CATH AND CORS/GRAFTS ANGIOGRAPHY N/A 10/21/2018   Procedure: LEFT HEART CATH AND CORS/GRAFTS ANGIOGRAPHY;  Surgeon: Troy Sine, MD;  Location: [place] CV LAB;  Service: Cardiovascular;  Laterality: N/A;  . PENILE PROSTHESIS IMPLANT    . RIGHT HEART CATHETERIZATION N/A 11/14/2013   Procedure: RIGHT HEART CATH;  Surgeon: Larey Dresser, MD;  Location: [facility] CATH LAB;   Service: Cardiovascular;  Laterality: N/A;  . SHOULDER OPEN ROTATOR CUFF REPAIR Right 2003  . TEE WITHOUT CARDIOVERSION N/A 03/24/2019   Procedure: TRANSESOPHAGEAL ECHOCARDIOGRAM (TEE);  Surgeon: Larey Dresser, MD;  Location: [facility] ENDOSCOPY;  Service: Cardiovascular;  Laterality: N/A;    Family History  Problem Relation Age of Onset  . Coronary artery disease Mother   . Hypertension Mother   . Heart disease Mother      Social History   Tobacco Use  Smoking Status Former Smoker  . Packs/day: 1.00  . Years: 35.00  . Pack years: 35.00  . Types: Cigarettes  . Quit date: 02/18/1984  . Years since quitting: 35.1  Smokeless Tobacco Never Used    Social History   Substance and Sexual Activity  Alcohol Use Yes  . Alcohol/week: 2.0 standard drinks  . Types: 2 Standard drinks or equivalent per week     Allergies  Allergen Reactions  . Ancef [Cefazolin Sodium] Hives  . Vancomycin Rash    Current Facility-Administered Medications  Medication Dose Route Frequency Provider Last Rate Last Dose  . vancomycin (VANCOCIN) IVPB 1000 mg/200 mL premix  1,000 mg Intravenous On Call to OR Grace Isaac, MD       Facility-Administered Medications Ordered in Other Encounters  Medication Dose Route Frequency Provider Last Rate Last Dose  . lactated ringers infusion   Intravenous Continuous PRN Orlie Dakin, CRNA          Review of Systems:     Cardiac Review of Systems: [Y] = yes  or   [ N ] = no   Chest Pain Blue.Reese    ]  Resting SOB [ n  ] Exertional SOB  Blue.Reese  ]  Vertell Limber Florencio.Farrier  ]   Pedal Edema [ n  ]    Palpitations [ n ] Syncope  [n  ]   Presyncope [ n  ]   General Review of Systems: [Y] = yes [  ]=no Constitional: recent weight change [  ];  Wt loss over the last 3 months [   ] anorexia [  ]; fatigue [  ]; nausea [  ]; night sweats [  ]; fever [  ]; or chills [  ];           Eye : blurred vision [  ]; diplopia [   ]; vision changes [  ];  Amaurosis fugax[  ]; Resp:  cough [  ];   wheezing[  ];  hemoptysis[  ]; shortness of breath[  ]; paroxysmal nocturnal dyspnea[  ]; dyspnea on exertion[  ]; or orthopnea[  ];  GI:  gallstones[  ], vomiting[  ];  dysphagia[  ]; melena[  ];  hematochezia [  ]; heartburn[  ];   Hx of  Colonoscopy[  ]; GU: kidney stones [  ]; hematuria[  ];   dysuria [  ];  nocturia[  ];  history of     obstruction [  ]; urinary frequency [  ]             Skin: rash, swelling[  ];, hair loss[  ];  peripheral edema[  ];  or itching[  ]; Musculosketetal: myalgias[  ];  joint swelling[  ];  joint erythema[  ];  joint pain[  ];  back pain[  ];  Heme/Lymph: bruising[  ];  bleeding[  ];  anemia[  ];  Neuro: TIA[  ];  headaches[  ];  stroke[  ];  vertigo[  ];  seizures[  ];   paresthesias[  ];  difficulty walking[  ];  Psych:depression[  ]; anxiety[  ];  Endocrine: diabetes[  ];  thyroid dysfunction[  ];  Immunizations: Flu up to date [  ]; Pneumococcal up to date [  ];  Other:     PHYSICAL EXAMINATION: BP (!) 150/73   Pulse 66   Temp 98 F (36.7 C) (Oral)   Resp 18   Ht 6\' 3"  (1.905 m)   Wt 92.5 kg   SpO2 96%   BMI 25.50 kg/m  General appearance: alert and cooperative Neck: no adenopathy, no carotid bruit, no JVD, supple, symmetrical, trachea midline and thyroid not enlarged, symmetric, no tenderness/mass/nodules Lymph nodes: Cervical, supraclavicular, and axillary nodes normal. Resp: diminished breath sounds LLL Cardio: regular rate and rhythm, S1, S2 normal, no murmur, click, rub or gallop GI: soft, non-tender; bowel sounds normal; no masses,  no organomegaly Extremities: extremities normal, atraumatic, no cyanosis or edema Neurologic: Grossly normal    Diagnostic Studies & Laboratory data:     Recent Radiology Findings:  Dg Chest 2 View  Result Date: 04/08/2019 CLINICAL DATA:  Left pleural effusion follow-up. EXAM: CHEST - 2 VIEW COMPARISON:  CT chest dated March 14, 2019. Chest x-ray dated March 08, 2019. FINDINGS: The heart size and  mediastinal contours are within normal limits. Prior CABG. Atherosclerotic calcification of the aortic arch. Normal pulmonary vascularity. Unchanged small left pleural effusion with adjacent left basilar atelectasis. The right lung is clear. No pneumothorax. No acute osseous abnormality. IMPRESSION: 1. Unchanged small left pleural effusion with adjacent left basilar atelectasis. Electronically Signed   By: Titus Dubin M.D.   On: 04/08/2019 10:40    Dg Chest 1 View  Result Date: 02/24/2019 CLINICAL DATA:  Status post left thoracentesis. EXAM: CHEST  1 VIEW COMPARISON:  Radiographs of February 21, 2019. FINDINGS: Stable cardiomediastinal silhouette. Status post coronary bypass graft. Atherosclerosis of thoracic aorta is noted. No pneumothorax is noted. Right lung is clear. Left pleural effusion noted on prior exam has resolved status post thoracentesis. Minimal left basilar residual atelectasis or scarring remains. Bony thorax is unremarkable. IMPRESSION: Left pleural effusion noted on prior exam has resolved status post thoracentesis. Minimal left basilar residual atelectasis or scarring remains. No pneumothorax is noted. Aortic Atherosclerosis (ICD10-I70.0). Electronically Signed   By: Marijo Conception M.D.   On: 02/24/2019 14:09   Dg Chest 2 View  Result Date: 03/09/2019 CLINICAL DATA:  Left-sided recurrent pleural effusion. EXAM: CHEST - 2 VIEW COMPARISON:  02/24/2019. FINDINGS: Prior CABG. Heart size normal. Mild left base atelectasis. Small left-sided pleural effusion, new from prior exam. No pneumothorax. Degenerative change thoracic spine and both shoulders. IMPRESSION: 1.  Small left pleural effusion, new from prior exam. 2.  Mild left base atelectasis. 3.  Prior CABG.  Heart size stable. Electronically Signed   By: Marcello Moores  Register   On: 03/09/2019 08:33   Dg Chest 2 View  Result Date: 02/21/2019 CLINICAL DATA:  Pleural effusion EXAM: CHEST - 2 VIEW COMPARISON:  Feb 04, 2019. FINDINGS: There is a  fairly small left effusion which appears slightly larger compared to most recent study. There is consolidation as well in the left base. Lungs elsewhere are clear. Heart size and pulmonary vascularity are normal. No adenopathy. Patient is status post coronary artery bypass grafting. There is aortic atherosclerosis. There is degenerative change in each shoulder as well as in the thoracic spine. IMPRESSION: Fairly small left pleural effusion, slightly larger than on study from 2 weeks prior with associated left base atelectasis/consolidation. Lungs elsewhere clear. Heart size normal. Postoperative changes noted. Aortic Atherosclerosis (ICD10-I70.0). Electronically Signed   By: Lowella Grip III M.D.   On: 02/21/2019 13:58   Ct Chest Wo Contrast  Result Date: 03/14/2019 CLINICAL DATA:  Chest pain.  Left pleural effusion. EXAM: CT CHEST WITHOUT CONTRAST TECHNIQUE: Multidetector CT imaging of the chest was performed following the standard protocol without IV contrast. COMPARISON:  10/24/2018. FINDINGS: Cardiovascular: The heart size is normal. No substantial pericardial effusion. Coronary artery calcification is evident. Patient is status post CABG. Atherosclerotic calcification is noted in the wall of the thoracic aorta. Ascending thoracic aorta measures 4.2 cm diameter. Mediastinum/Nodes: 2.1 x 3.5 x 3.8 cm lesion is identified in the mediastinum, wedged between the left atrium in the esophagus and apparently arising from the pericardium (see image 107 of sagittal series 6). This lesion has low attenuation and is probably complex fluid. No other evidence of mediastinal or hilar lymphadenopathy on this noncontrast study. There is no hilar lymphadenopathy. The esophagus has normal imaging features. Lungs/Pleura: Centrilobular emphsyema noted. Biapical pleuroparenchymal scarring noted. Tiny right upper lobe calcified granuloma. 4 mm subpleural right middle lobe nodule (109/8) is unchanged since an exam of  06/24/2011, consistent with benign etiology. Trace atelectasis noted posterior right lower lobe with evidence of small airway impaction. No suspicious pulmonary nodule or mass in the aerated portions of the left lung. There is some lingular and left lower lobe collapse/consolidation. Small left pleural effusion again noted. Upper Abdomen: Unremarkable Musculoskeletal: No worrisome lytic or sclerotic osseous abnormality. IMPRESSION: 1. Left lower lobe collapse/consolidation with small left pleural effusion. There is some trace collapse/consolidative change in the lingula. 2. New 2.1 x 3.5 x 3.8 cm rim enhancing lesion in the posterior pericardium, between the left atrium in the esophagus. This is relatively low attenuation and may represent loculated complex pericardial fluid. Endoscopic ultrasound would likely prove helpful to further evaluate as clinically warranted. 3. Ascending thoracic aorta is stable at 4.2 cm diameter. 4.  Emphysema. (ICD10-J43.9) 5.  Aortic Atherosclerois (ICD10-170.0) Electronically Signed   By: Misty Stanley M.D.   On: 03/14/2019 13:00   Ir Thoracentesis Asp Pleural Space W/img Guide  Result Date: 02/24/2019 INDICATION: Patient with history of dyspnea and recurrent left-sided pleural effusion. Request is made for diagnostic and therapeutic left thoracentesis. EXAM: ULTRASOUND GUIDED DIAGNOSTIC AND THERAPEUTIC LEFT THORACENTESIS MEDICATIONS: 10 mL  1% lidocaine COMPLICATIONS: None immediate. PROCEDURE: An ultrasound guided thoracentesis was thoroughly discussed with the patient and questions answered. The benefits, risks, alternatives and complications were also discussed. The patient understands and wishes to proceed with the procedure. Written consent was obtained. Ultrasound was performed to localize and mark an adequate pocket of fluid in the left chest. The area was then prepped and draped in the normal sterile fashion. 1% Lidocaine was used for local anesthesia. Under ultrasound  guidance a 6 Fr Safe-T-Centesis catheter was introduced. Thoracentesis was performed. The catheter was removed and a dressing applied. FINDINGS: A total of approximately 1.3 L of clear gold fluid was removed. Samples were sent to the laboratory as requested by the clinical team. IMPRESSION: Successful ultrasound guided left thoracentesis yielding 1.3 L of pleural fluid. Read by: Earley Abide, PA-C Electronically Signed   By: Jerilynn Mages.  Shick M.D.   On: 02/24/2019 14:14     I have independently reviewed the above radiology studies  and reviewed the findings with the patient.   Recent Lab Findings: Lab Results  Component Value Date   WBC 6.5 03/08/2019   HGB 14.8 03/08/2019   HCT 45.1 03/08/2019   PLT 187.0 03/08/2019   GLUCOSE 103 (H) 03/21/2019   CHOL 96 11/09/2018   TRIG 75 11/09/2018   HDL 29 (L) 11/09/2018   LDLCALC 52 11/09/2018   ALT 12 01/10/2019   AST 15 01/10/2019   NA 143 03/21/2019   K 4.1 03/21/2019   CL 107 03/21/2019   CREATININE 1.21 03/21/2019   BUN 18 03/21/2019   CO2 27 03/21/2019   TSH 6.979 (H) 11/22/2018   INR 1.16 10/21/2018   HGBA1C (H) 12/06/2010    5.8 (NOTE)                                                                       According to the ADA Clinical Practice Recommendations for 2011, when HbA1c is used as a screening test:   >=6.5%   Diagnostic of Diabetes Mellitus           (if abnormal result  is confirmed)  5.7-6.4%   Increased risk of developing Diabetes Mellitus  References:Diagnosis and Classification of Diabetes Mellitus,Diabetes KYHC,6237,62(GBTDV 1):S62-S69 and Standards of Medical Care in         Diabetes - 2011,Diabetes Care,2011,34  (Suppl 1):S11-S61.   ECHO: Procedure: Transesophageal Echo  Indications:     Left atrial mass   History:         Patient has prior history of Echocardiogram examinations, most                  recent 10/19/2018. CHF CAD Atrial Fibrillation Aortic Valve                  Disease Risk Factors: Hypertension and  Sleep Apnea.   Sonographer:     Clayton Lefort RDCS (AE) Referring Phys:  [place] Diagnosing Phys: [name] MD     PROCEDURE: The transesophogeal probe was passed through the esophogus of the patient. The patient developed no complications during the procedure.  IMPRESSIONS    1. The left ventricle has low normal systolic function, with an ejection fraction of 50-55%. The cavity size  was normal. There is mildly increased left ventricular wall thickness. There was a basal inferolateral aneurysm.  2. The aortic valve is tricuspid Moderate calcification of the aortic valve. Mild stenosis of the aortic valve.  3. There is moderate mitral annular calcification present. No evidence of mitral valve stenosis. Trivial mitral regurgitation.  4. Left atrial size was mildly dilated.  5. No evidence of a thrombus present in the left atrial appendage.  6. No PFO or ASD by color doppler.  7. Pleural effusion noted.  8. There appeared to be a loculated pericardial effusion posterior to the left atrium with invagination of the left atrial free wall (this was the structure seen on prior echo).  9. Normal caliber descending thoracic aorta with grade 3-4 plaque. 10. The right ventricle has normal systolc function. The cavity was normal.  FINDINGS  Left Ventricle: The left ventricle has low normal systolic function, with an ejection fraction of 50-55%. The cavity size was normal. There is mildly increased left ventricular wall thickness.  Right Ventricle: The right ventricle has normal systolic function. The cavity was normal.  Left Atrium: Left atrial size was mildly dilated.   Left Atrial Appendage: No evidence of a thrombus present in the left atrial appendage.  Right Atrium: Right atrial size was normal in size. Right atrial pressure is estimated at 10 mmHg.  Interatrial Septum: No atrial level shunt detected by color flow Doppler.  Pericardium: There appeared to be a  loculated pericardial effusion posterior to the left atrium with invagination of the left atrial free wall (this was the structure seen on prior echo). Pleural effusion noted.  Mitral Valve: There is moderate mitral annular calcification present. Mitral valve regurgitation is trivial by color flow Doppler. No evidence of mitral valve stenosis.  Tricuspid Valve: The tricuspid valve was normal in structure. Tricuspid valve regurgitation was not visualized by color flow Doppler.  Aortic Valve: The aortic valve is tricuspid Moderate calcification of the aortic valve. Aortic valve regurgitation was not visualized by color flow Doppler. There is Mild stenosis of the aortic valve.  Aorta: Normal caliber descending thoracic aorta with grade 3-4 plaque.    [name] MD Electronically signed by [name] MD Signature Date/Time: 03/24/2019/10:24:58 AM      Assessment / Plan:     recurrent left pleural effusion, of 1- 1.3 l size  unknown etiology but persistent,  did not originally with left pneumothorax.   Follow-up CT scan shows new mass between the left atrium and the esophagus- New 2.1 x 3.5 x 3.8 cm rim enhancing lesion in the posterior pericardium, between the left atrium in the esophagus. Now evaluated with TEE and preop cardiology clearance by Dr Aundra Dubin.  I discussed the treatment options.  Plan to proceed with bronchoscopy left video-assisted thoracoscopy drainage of effusion possible talc pleurodesis and placement of Pleurx catheter.  In addition pericardial window through the left VATS if technically safe we will plan also.   The goals risks and alternatives of the planned surgical procedure Procedure(s): VIDEO BRONCHOSCOPY (N/A) VIDEO ASSISTED THORACOSCOPY (Left) TALC PLEURADESIS (Left) SUBXYPHOID PERICARDIAL WINDOW (N/A) INSERTION PLEURAL DRAINAGE CATHETER (Left)  have been discussed with the patient in detail. The risks of the procedure including death, infection, stroke,  myocardial infarction, bleeding, blood transfusion have all been discussed specifically.  I have quoted [name] a 2 % of perioperative mortality and a complication rate as high as 30%. The patient's questions have been answered.Ola L Kimes is willing  to proceed with  the planned procedure.    Grace Isaac MD      [address] [place] [address] Office [phone]   Beeper [phone]  04/12/2019 7:00 AM

## 2019-04-12 NOTE — Brief Op Note (Signed)
      [address]       [employer],[place] [address]             [phone]      04/12/2019  10:35 AM  PATIENT:  Eric Gordon  85 y.o. male  PRE-OPERATIVE DIAGNOSIS:  LEFT EFFUSION  POST-OPERATIVE DIAGNOSIS:  LEFT EFFUSION  PROCEDURE:  Procedure(s): VIDEO BRONCHOSCOPY (N/A) VIDEO ASSISTED THORACOSCOPY (Left) TALC PLEURADESIS (Left) Transesophageal Echocardiogram (Tee) Pleural Biopsy (Left) Drainage Of Pleural Effusion (Left)  SURGEON:  Surgeon(s) and Role:    * Grace Isaac, MD - Primary  PHYSICIAN ASSISTANT:  Nicholes Rough, PA-C   ANESTHESIA:   general  EBL:  none  BLOOD ADMINISTERED:none  DRAINS: ONE STRIGHT CHEST TUBE   LOCAL MEDICATIONS USED:  NONE  SPECIMEN:  Source of Specimen:  PLEURAL BIOPSY, 1500 ml pleural fluid drained   DISPOSITION OF SPECIMEN:  PATHOLOGY and micro and chemistry   COUNTS:  YES   DICTATION: .Dragon Dictation  PLAN OF CARE: Admit to inpatient   PATIENT DISPOSITION:  PACU - hemodynamically stable.   Delay start of Pharmacological VTE agent (>24hrs) due to surgical blood loss or risk of bleeding: no

## 2019-04-12 NOTE — Progress Notes (Signed)
  Echocardiogram Echocardiogram Transesophageal has been performed.  Eric Gordon 04/12/2019, 11:39 AM

## 2019-04-12 NOTE — Anesthesia Procedure Notes (Addendum)
Central Venous Catheter Insertion Performed by: Barnet Glasgow, MD, anesthesiologist Start/End7/21/2020 6:55 AM, 04/12/2019 7:13 AM Patient location: Pre-op. Preanesthetic checklist: patient identified, IV checked, site marked, risks and benefits discussed, surgical consent, monitors and equipment checked, pre-op evaluation, timeout performed and anesthesia consent Lidocaine 1% used for infiltration and patient sedated Hand hygiene performed  and maximum sterile barriers used  Catheter size: 8 Fr Total catheter length 16. Central line was placed.Double lumen Procedure performed using ultrasound guided technique. Ultrasound Notes:image(s) printed for medical record Attempts: 1 Following insertion, dressing applied and line sutured. Post procedure assessment: blood return through all ports  Patient tolerated the procedure well with no immediate complications.

## 2019-04-12 NOTE — Anesthesia Procedure Notes (Signed)
Procedure Name: Intubation Date/Time: 04/12/2019 7:44 AM Performed by: Orlie Dakin, CRNA Pre-anesthesia Checklist: Patient identified, Emergency Drugs available, Suction available and Patient being monitored Patient Re-evaluated:Patient Re-evaluated prior to induction Oxygen Delivery Method: Circle system utilized Preoxygenation: Pre-oxygenation with 100% oxygen Induction Type: IV induction Ventilation: Mask ventilation with difficulty and Oral airway inserted - appropriate to patient size Laryngoscope Size: 3 and Miller Grade View: Grade I Tube type: Oral Tube size: 9.0 mm Number of attempts: 1 Airway Equipment and Method: Stylet Placement Confirmation: ETT inserted through vocal cords under direct vision,  positive ETCO2 and breath sounds checked- equal and bilateral Secured at: 24 cm Tube secured with: Tape Dental Injury: Teeth and Oropharynx as per pre-operative assessment

## 2019-04-12 NOTE — Anesthesia Procedure Notes (Signed)
Arterial Line Insertion Start/End7/21/2020 6:40 AM, 04/12/2019 6:45 AM Performed by: Gaylene Brooks, CRNA, CRNA  Patient location: Pre-op. Preanesthetic checklist: patient identified, IV checked, risks and benefits discussed, surgical consent, monitors and equipment checked, pre-op evaluation and timeout performed Lidocaine 1% used for infiltration Left, radial was placed Catheter size: 20 G Hand hygiene performed  and maximum sterile barriers used   Attempts: 1 Procedure performed without using ultrasound guided technique. Following insertion, dressing applied and Biopatch. Post procedure assessment: normal  Patient tolerated the procedure well with no immediate complications.

## 2019-04-12 NOTE — Plan of Care (Signed)

## 2019-04-12 NOTE — Anesthesia Procedure Notes (Addendum)
Procedure Name: Intubation Date/Time: 04/12/2019 7:55 AM Performed by: Orlie Dakin, CRNA Pre-anesthesia Checklist: Patient identified, Emergency Drugs available, Suction available and Patient being monitored Patient Re-evaluated:Patient Re-evaluated prior to induction Oxygen Delivery Method: Circle system utilized Preoxygenation: Pre-oxygenation with 100% oxygen Laryngoscope Size: Mac and 4 Grade View: Grade I Tube type: Oral Endobronchial tube: Left, EBT position confirmed by auscultation and Double lumen EBT and 39 Fr Number of attempts: 1 Airway Equipment and Method: Stylet Placement Confirmation: ETT inserted through vocal cords under direct vision,  positive ETCO2 and breath sounds checked- equal and bilateral Tube secured with: Tape Dental Injury: Teeth and Oropharynx as per pre-operative assessment  Comments: Viva-sight DL EBT used.

## 2019-04-13 ENCOUNTER — Inpatient Hospital Stay (HOSPITAL_COMMUNITY): Payer: Medicare Other

## 2019-04-13 ENCOUNTER — Encounter (HOSPITAL_COMMUNITY): Payer: Self-pay | Admitting: Cardiothoracic Surgery

## 2019-04-13 LAB — BLOOD GAS, ARTERIAL
Acid-Base Excess: 4.8 mmol/L — ABNORMAL HIGH (ref 0.0–2.0)
Bicarbonate: 29 mmol/L — ABNORMAL HIGH (ref 20.0–28.0)
O2 Saturation: 97.2 %
Patient temperature: 98.6
pCO2 arterial: 45.4 mmHg (ref 32.0–48.0)
pH, Arterial: 7.422 (ref 7.350–7.450)
pO2, Arterial: 91.2 mmHg (ref 83.0–108.0)

## 2019-04-13 LAB — GLUCOSE, CAPILLARY
Glucose-Capillary: 122 mg/dL — ABNORMAL HIGH (ref 70–99)
Glucose-Capillary: 95 mg/dL (ref 70–99)
Glucose-Capillary: 93 mg/dL (ref 70–99)
Glucose-Capillary: 132 mg/dL — ABNORMAL HIGH (ref 70–99)
Glucose-Capillary: 101 mg/dL — ABNORMAL HIGH (ref 70–99)

## 2019-04-13 LAB — CBC
HCT: 44.3 % (ref 39.0–52.0)
Hemoglobin: 14.5 g/dL (ref 13.0–17.0)
MCH: 31.4 pg (ref 26.0–34.0)
MCHC: 32.7 g/dL (ref 30.0–36.0)
MCV: 95.9 fL (ref 80.0–100.0)
Platelets: 152 10*3/uL (ref 150–400)
RBC: 4.62 MIL/uL (ref 4.22–5.81)
RDW: 14.6 % (ref 11.5–15.5)
WBC: 15.3 10*3/uL — ABNORMAL HIGH (ref 4.0–10.5)
nRBC: 0 % (ref 0.0–0.2)

## 2019-04-13 LAB — BASIC METABOLIC PANEL
Anion gap: 10 (ref 5–15)
BUN: 19 mg/dL (ref 8–23)
CO2: 28 mmol/L (ref 22–32)
Calcium: 8.8 mg/dL — ABNORMAL LOW (ref 8.9–10.3)
Chloride: 101 mmol/L (ref 98–111)
Creatinine, Ser: 1.26 mg/dL — ABNORMAL HIGH (ref 0.61–1.24)
GFR calc Af Amer: 60 mL/min — ABNORMAL LOW (ref >60)
GFR calc non Af Amer: 52 mL/min — ABNORMAL LOW (ref >60)
Glucose, Bld: 138 mg/dL — ABNORMAL HIGH (ref 70–99)
Potassium: 3.8 mmol/L (ref 3.5–5.1)
Sodium: 139 mmol/L (ref 135–145)

## 2019-04-13 MED ORDER — FENTANYL 40 MCG/ML IV SOLN
INTRAVENOUS | Status: DC
  Administered 2019-04-13: 40 ug via INTRAVENOUS
  Administered 2019-04-13 (×2): 20 ug via INTRAVENOUS
  Administered 2019-04-13: 60 ug via INTRAVENOUS
  Administered 2019-04-14: 20 ug via INTRAVENOUS
  Administered 2019-04-14 (×2): 40 ug via INTRAVENOUS
  Administered 2019-04-14: 10 ug via INTRAVENOUS
  Administered 2019-04-14: 40 ug via INTRAVENOUS
  Administered 2019-04-15: 20 ug via INTRAVENOUS
  Filled 2019-04-13 (×15): qty 25

## 2019-04-13 MED FILL — Fentanyl Citrate Preservative Free (PF) Inj 1000 MCG/20ML: INTRAMUSCULAR | Qty: 20 | Status: AC

## 2019-04-13 MED FILL — Sodium Chloride IV Soln 0.9%: INTRAVENOUS | Qty: 25 | Status: AC

## 2019-04-13 NOTE — Anesthesia Postprocedure Evaluation (Signed)
Anesthesia Post Note  Patient: [name]  Procedure(s) Performed: VIDEO BRONCHOSCOPY (N/A ) VIDEO ASSISTED THORACOSCOPY (Left Chest) TALC PLEURADESIS (Left ) Transesophageal Echocardiogram (Tee) Pleural Biopsy (Left ) Drainage Of Pleural Effusion (Left )     Patient location during evaluation: PACU Anesthesia Type: General Level of consciousness: awake and alert Pain management: pain level controlled Vital Signs Assessment: post-procedure vital signs reviewed and stable Respiratory status: spontaneous breathing, nonlabored ventilation, respiratory function stable and patient connected to nasal cannula oxygen Cardiovascular status: blood pressure returned to baseline and stable Postop Assessment: no apparent nausea or vomiting Anesthetic complications: no    Last Vitals:  Vitals:   04/13/19 0412 04/13/19 0720  BP:  129/61  Pulse:  (!) 57  Resp: (!) 23 19  Temp:  36.7 C  SpO2: 97% 94%    Last Pain:  Vitals:   04/13/19 0812  TempSrc:   PainSc: 4                  Barnet Glasgow

## 2019-04-13 NOTE — Progress Notes (Signed)
[facility] DAILY ICU PROGRESS NOTE                   [address]            [place] [address]          [phone]   1 Day Post-Op Procedure(s) (LRB): VIDEO BRONCHOSCOPY (N/A) VIDEO ASSISTED THORACOSCOPY (Left) TALC PLEURADESIS (Left) Transesophageal Echocardiogram (Tee) Pleural Biopsy (Left) Drainage Of Pleural Effusion (Left)  Total Length of Stay:  LOS: 1 day   Subjective: Patient feels well this morning, notes respiratory effort is better  Objective: Vital signs in last 24 hours: Temp:  [96.8 F (36 C)-98.1 F (36.7 C)] 98.1 F (36.7 C) (07/22 0720) Pulse Rate:  [53-69] 57 (07/22 0720) Cardiac Rhythm: Bundle branch block;Heart block (07/22 0702) Resp:  [14-30] 19 (07/22 0720) BP: (113-160)/(61-93) 129/61 (07/22 0720) SpO2:  [90 %-100 %] 94 % (07/22 0720) Arterial Line BP: (148-170)/(73-104) 160/74 (07/21 1145) Weight:  [93.6 kg] 93.6 kg (07/21 1200)  Filed Weights   04/12/19 0622 04/12/19 1200  Weight: 92.5 kg 93.6 kg    Weight change: 1.066 kg   Hemodynamic parameters for last 24 hours:    Intake/Output from previous day: 07/21 0701 - 07/22 0700 In: 3158.1 [I.V.:3158.1] Out: 3542 [Urine:3350; Chest Tube:192]  Intake/Output this shift: Total I/O In: 240 [P.O.:240] Out: -   Current Meds: Scheduled Meds: . acetaminophen  1,000 mg Oral Q6H   Or  . acetaminophen (TYLENOL) oral liquid 160 mg/5 mL  1,000 mg Oral Q6H  . atorvastatin  80 mg Oral Daily  . bisacodyl  10 mg Oral Daily  . enoxaparin (LOVENOX) injection  40 mg Subcutaneous Q24H  . fentaNYL   Intravenous Q4H  . insulin aspart  0-24 Units Subcutaneous Q4H  . levothyroxine  50 mcg Oral Daily  . pantoprazole  40 mg Oral Daily  . senna-docusate  1 tablet Oral QHS  . tamsulosin  0.4 mg Oral QPC supper   Continuous Infusions: . dextrose 5 % and 0.45% NaCl 100 mL/hr at 04/13/19 0549   PRN Meds:.fentaNYL (SUBLIMAZE) injection, oxyCODONE, traMADol  General appearance: alert,  cooperative and no distress Neurologic: intact Heart: regular rate and rhythm, S1, S2 normal, no murmur, click, rub or gallop Lungs: clear to auscultation bilaterally Abdomen: soft, non-tender; bowel sounds normal; no masses,  no organomegaly Extremities: extremities normal, atraumatic, no cyanosis or edema and Homans sign is negative, no sign of DVT Wound: Approximately 200 mL drainage from chest tube since surgery, straw-colored pleural fluid not blood No air leak  Lab Results: CBC: Recent Labs    04/12/19 0700 04/13/19 0430  WBC 7.3 15.3*  HGB 15.3 14.5  HCT 47.9 44.3  PLT 153 152   BMET:  Recent Labs    04/12/19 0700 04/13/19 0430  NA 141 139  K 3.6 3.8  CL 105 101  CO2 25 28  GLUCOSE 92 138*  BUN 24* 19  CREATININE 1.34* 1.26*  CALCIUM 9.2 8.8*    CMET: Lab Results  Component Value Date   WBC 15.3 (H) 04/13/2019   HGB 14.5 04/13/2019   HCT 44.3 04/13/2019   PLT 152 04/13/2019   GLUCOSE 138 (H) 04/13/2019   CHOL 96 11/09/2018   TRIG 75 11/09/2018   HDL 29 (L) 11/09/2018   LDLCALC 52 11/09/2018   ALT 12 04/12/2019   AST 19 04/12/2019   NA 139 04/13/2019   K 3.8 04/13/2019   CL 101 04/13/2019   CREATININE  1.26 (H) 04/13/2019   BUN 19 04/13/2019   CO2 28 04/13/2019   TSH 6.979 (H) 11/22/2018   INR 1.1 04/12/2019   HGBA1C (H) 12/06/2010    5.8 (NOTE)                                                                       According to the ADA Clinical Practice Recommendations for 2011, when HbA1c is used as a screening test:   >=6.5%   Diagnostic of Diabetes Mellitus           (if abnormal result  is confirmed)  5.7-6.4%   Increased risk of developing Diabetes Mellitus  References:Diagnosis and Classification of Diabetes Mellitus,Diabetes XYIA,1655,37(SMOLM 1):S62-S69 and Standards of Medical Care in         Diabetes - 2011,Diabetes Care,2011,34  (Suppl 1):S11-S61.    PT/INR:  Recent Labs    04/12/19 0700  LABPROT 14.0  INR 1.1   Radiology: Dg Chest  Port 1 View  Result Date: 04/12/2019 CLINICAL DATA:  Pleural effusion. EXAM: PORTABLE CHEST 1 VIEW COMPARISON:  Radiographs of April 08, 2019. FINDINGS: Stable cardiomegaly. Atherosclerosis of thoracic aorta is noted. Status post coronary artery bypass graft. Right internal jugular catheter is noted with tip in expected position of the SVC. Left-sided chest tube is noted. Minimal left apical pneumothorax is noted. No significant pleural effusion is noted. Lungs are clear. Bony thorax is unremarkable. IMPRESSION: Minimal left apical pneumothorax is noted status post left-sided chest tube placement. Right internal jugular catheter is noted in grossly good position. Electronically Signed   By: Marijo Conception M.D.   On: 04/12/2019 11:58   Pleural fluid findings: Total protein 4.1 Glucose 96 Gram stain-few white cells no bacteria Cytology and path pending  LDH 98 Assessment/Plan: S/P Procedure(s) (LRB): VIDEO BRONCHOSCOPY (N/A) VIDEO ASSISTED THORACOSCOPY (Left) TALC PLEURADESIS (Left) Transesophageal Echocardiogram (Tee) Pleural Biopsy (Left) Drainage Of Pleural Effusion (Left) Mobilize DC central line DC Foley Transition off PCA pump Leave chest tube 1 more day Follow-up chest x-ray in a.m.    Eric Gordon 04/13/2019 7:49 AM

## 2019-04-13 NOTE — Discharge Instructions (Signed)
Discharge Instructions:  1. You may shower, please wash incisions daily with soap and water and keep dry.  If you wish to cover wounds with dressing you may do so but please keep clean and change daily.  No tub baths or swimming until incisions have completely healed.  If your incisions become red or develop any drainage please call our office at [phone]  2. No Driving until cleared by our office and you are no longer using narcotic pain medications  3. Monitor your weight daily.. Please use the same scale and weigh at same time... If you gain 3-5 lbs in 48 hours with associated lower extremity swelling, please contact our office at [phone]  4. Fever of 101.5 for at least 24 hours, please contact our office at [phone]  5. Activity- up as tolerated, please walk at least 3 times per day.  Avoid strenuous activity, no lifting, pushing, or pulling with your arms over 8-10 lbs for a minimum of 6 weeks  6. If any questions or concerns arise, please do not hesitate to contact our office at [phone] 

## 2019-04-13 NOTE — Discharge Summary (Signed)
[address]       [place] [address]             [phone]       Physician Discharge Summary  Patient ID: Eric Gordon MRN: [id number] DOB/AGE: December 21, 1930 85 y.o.  Admit date: 04/12/2019 Discharge date: 04/16/2019  Admission Diagnoses: Patient Active Problem List   Diagnosis Date Noted   Pleural effusion 04/12/2019   Pneumothorax on left 01/10/2019   Pneumothorax, left 01/09/2019   CKD (chronic kidney disease), stage III ([facility]) 01/09/2019   Chronic diastolic CHF (congestive heart failure) ([facility]) 01/09/2019   Hypothyroidism 01/09/2019   Pleural effusion on left 01/09/2019   NSTEMI (non-ST elevated myocardial infarction) ([facility]) 74/08/8785   Acute diastolic CHF (congestive heart failure) ([facility])    Non-ST elevation (NSTEMI) myocardial infarction ([facility]) 10/17/2018   CAP (community acquired pneumonia) 03/19/2018   Hoarseness 12/15/2013   Mild aortic stenosis 11/03/2012   Atrial fibrillation ([facility]) 04/02/2011   Carotid stenosis, asymptomatic 04/02/2011   CAD (coronary artery disease) 01/10/2011   Abdominal aortic aneurysm ([facility]) 11/06/2010   CHEST TIGHTNESS-PRESSURE-OTHER 11/01/2010   Asthma, mild intermittent 04/10/2010   DYSPNEA 02/26/2010   OBSTRUCTIVE SLEEP APNEA 08/22/2008   Essential hypertension 08/22/2008   BARRETTS ESOPHAGUS 08/18/2008   HIATAL HERNIA 08/18/2008   DIVERTICULOSIS, COLON 08/18/2008   COLONIC POLYPS, HX OF 08/18/2008    Discharge Diagnoses:  Active Problems:   Pleural effusion   Discharged Condition: good  HPI:   Eric Gordon 85 y.o. male is seen in the office  after recent evaluation by cardiology including TEE. Previously he was  hospitalizated for spontaneous pneumothorax.  He was hospitalized in a left chest tube was placed by the pulmonary service with resolution of the pneumothorax but tube was removed and he was discharged home subsequently he has had a thoracentesis for recurrent  left pleural effusion.  He is referred from the pulmonary office.  He denies fever chills or night sweats.  Previously he has never had a pneumothorax.  He did have coronary artery bypass grafting in 2012 with use of left internal mammary artery.     In January of this year he presented with non-STEMI myocardial infarction and underwent cardiac catheterization on 3 occasions over several day.  But ultimately with no intervention done  Hospital Course:   Mr. Febus underwent a video bronchoscopy, video-assisted thoracoscopy, talc pleurodesis, pleural biopsy, and drainage of a pleural effusion with Dr. Servando Snare on 04/12/2019. He tolerated the procedure well and was transferred to the stepdown unit for continued care. POD 1 he was feeling okay and he felt he was breathing better. We removed his central line, foley, and transitioned him off his PCA pump. We will plan to leave the chest tube one more day before removal. We continued to trend his chest xrays. Surgical pathology was pending. POD 2 he continued to progress. We kept his chest tubes in due to the output. His creatinine was stable. We discontinued his blood glucose sticks since they were so well controlled and he is not a diabetic. We continued lovenox for DVT prophylaxis. POD 3 he continued to progress. His chest tube drainage was decreasing and there was no air leak therefore we removed the chest tube. We discontinued his PCA and tried to control his pain with oral pain medication only to prepare him for home. Follow-up CXR on 7/25 showed a persistent tiny left apical PTX with some apical scarring that was unchanged from the day  previous. Today, he is tolerating room air, ambulating independently, his incisions are healing well and he is ready for discharge home. Discharge instructions have been shared with the patient.    Consults: None  Significant Diagnostic Studies:    CLINICAL DATA:  Chest tube present.  Shortness of  breath.  EXAM: PORTABLE CHEST 1 VIEW  COMPARISON:  April 12, 2019  FINDINGS: The heart size and mediastinal contours are stable. Right central venous line is identified distal tip in the superior vena cava unchanged. Left chest tube is unchanged. The previously noted left apical pleural line is not definitely seen on current exam. Mild patchy opacity of left lung base is identified. The visualized skeletal structures are stable.  IMPRESSION: Left chest tube is unchanged. Previously noted left apically pleural line is not seen on current exam.   Electronically Signed   By: Abelardo Diesel M.D.   On: 04/13/2019 09:03   Treatments:    NAME: Eric Gordon OE:[id number] ACCOUNT [id number] DATE OF BIRTH:14-Sep-1931 FACILITY: [facility] LOCATION: [facility] PHYSICIAN:EDWARD Maryruth Bun, MD  OPERATIVE REPORT  DATE OF PROCEDURE:  04/12/2019  PREOPERATIVE DIAGNOSIS:  Recurrent left pleural effusions.  POSTOPERATIVE DIAGNOSIS:  Recurrent left pleural effusions.  SURGICAL PROCEDURE:  Bronchoscopy, left video-assisted thoracoscopy, drainage of pleural effusion with pleural biopsies and talc pleurodesis.  SURGEON:  [name], MD  FIRST ASSISTANT:  Nicholes Rough, PA  BRIEF HISTORY:  The patient is an 85 year old male who in early 2020 presented to [facility] with a left pneumothorax with pleural effusion.  He had also had a myocardial infarction in early 2020.  He had previous history of coronary artery bypass  grafting 8 years previously.  The patient was treated and followed by the pulmonary service and continued to have recurrent left pleural effusions.  Thoracentesis was done on 3 occasions, each draining 1 to 1.5 L of fluid.  Cytologies were negative.  The  patient was referred to thoracic surgery for treatment of his recurrent pleural effusions.  Followup CT scan was done to compare to previous studies.  There is no evidence of underlying  malignancy on the CT scan.  The patient did have a small fluid  collection posterior to the heart from the CT.  This was difficult to discern if it was in the pericardium or outside the pericardium.  Ultimately, Dr. [name] saw the patient for preop clearance and also for echocardiogram, and TEE was done  confirming a small fluid collection in the pericardium posteriorly.  Treatment options were discussed with the patient in detail, and with his recurrent fluid and without any specific diagnosis, we recommended to proceed with video-assisted thoracoscopy,  drainage of pleural effusion, pleural biopsies, and consideration of talc pleurodesis.  The patient agreed and signed informed consent.   Discharge Exam: Blood pressure 115/63, pulse 67, temperature 97.7 F (36.5 C), temperature source Oral, resp. rate (!) 21, height 6\' 3"  (1.905 m), weight 93.6 kg, SpO2 98 %.   Physical Exam: General appearance: alert, cooperative and no distress Neurologic: intact Heart: regular rate and rhythm Lungs: clear to auscultation bilaterally. The PA/LAT CXR obtained this am was reviewed and shows a tiny left apical PTX  with some apical scarring that is stable. Wound: Chest tube exit site is covered with an intact dry dressing.  Disposition:    Allergies as of 04/16/2019      Reactions   Ancef [cefazolin Sodium] Hives   Vancomycin Rash      Medication List  TAKE these medications   acetaminophen 500 MG tablet Commonly known as: TYLENOL Take 1,000 mg by mouth every 6 (six) hours as needed for moderate pain or headache.   apixaban 5 MG Tabs tablet Commonly known as: ELIQUIS Take 1 tablet (5 mg total) by mouth 2 (two) times daily.   Ascorbic Acid 500 MG Caps Take 500 mg by mouth daily.   atorvastatin 80 MG tablet Commonly known as: LIPITOR Take 1 tablet (80 mg total) by mouth daily.   carvedilol 3.125 MG tablet Commonly known as: Coreg Take 1 tablet (3.125 mg total) by mouth 2 (two)  times daily.   CENTRUM SILVER PO Take 1 tablet by mouth daily.   cetirizine 10 MG tablet Commonly known as: ZYRTEC Take 10 mg by mouth daily.   cholecalciferol 1000 units tablet Commonly known as: VITAMIN D Take 1,000 Units by mouth every evening.   furosemide 40 MG tablet Commonly known as: LASIX Take 1 tablet (40 mg total) by mouth daily.   isosorbide mononitrate 30 MG 24 hr tablet Commonly known as: IMDUR Take 2 tablets (60 mg total) by mouth daily.   levothyroxine 50 MCG tablet Commonly known as: SYNTHROID Take 50 mcg by mouth daily.   oxyCODONE-acetaminophen 5-325 MG tablet Commonly known as: Percocet Take 1 tablet by mouth every 4 (four) hours as needed for up to 3 days for severe pain.   pantoprazole 40 MG tablet Commonly known as: PROTONIX Take 40 mg by mouth daily.   tamsulosin 0.4 MG Caps capsule Commonly known as: FLOMAX Take 0.4 mg by mouth daily after supper.      Follow-up Information    Shon Baton, MD. Call in 1 day(s).   Specialty: Internal Medicine Contact information: [place] [address] [phone]        Larey Dresser, MD .   Specialty: Cardiology Contact information: [place] [address] [phone]        [facility] Follow up.   Specialty: Cardiothoracic Surgery Why: Your routine follow-up appointment is on 8/3 @1 :30pm. Please arrive at 1:00pm for a chest xray which is located at [facility] which is on the first floor of out building.  Contact information: [place], [place] [phone]          Signed: Antony Odea 04/16/2019, 8:33 AM

## 2019-04-13 NOTE — Plan of Care (Signed)

## 2019-04-13 NOTE — Op Note (Signed)
NAME: Eric Gordon, BHARGAVA MEDICAL RECORD [id number] ACCOUNT [id number] DATE OF BIRTH:1930/10/23 FACILITY: [facility] LOCATION: [place], [place]  OPERATIVE REPORT  DATE OF PROCEDURE:  04/12/2019  PREOPERATIVE DIAGNOSIS:  Recurrent left pleural effusions.  POSTOPERATIVE DIAGNOSIS:  Recurrent left pleural effusions.  SURGICAL PROCEDURE:  Bronchoscopy, left video-assisted thoracoscopy, drainage of pleural effusion with pleural biopsies and talc pleurodesis.  SURGEON:  [name], MD  FIRST ASSISTANT:  Nicholes Rough, PA  BRIEF HISTORY:  The patient is an 85 year old male who in early 2020 presented to [facility] with a left pneumothorax with pleural effusion.  He had also had a myocardial infarction in early 2020.  He had previous history of coronary artery bypass  grafting 8 years previously.  The patient was treated and followed by the pulmonary service and continued to have recurrent left pleural effusions.  Thoracentesis was done on 3 occasions, each draining 1 to 1.5 L of fluid.  Cytologies were negative.  The  patient was referred to thoracic surgery for treatment of his recurrent pleural effusions.  Followup CT scan was done to compare to previous studies.  There is no evidence of underlying malignancy on the CT scan.  The patient did have a small fluid  collection posterior to the heart from the CT.  This was difficult to discern if it was in the pericardium or outside the pericardium.  Ultimately, Dr. [name] saw the patient for preop clearance and also for echocardiogram, and TEE was done  confirming a small fluid collection in the pericardium posteriorly.  Treatment options were discussed with the patient in detail, and with his recurrent fluid and without any specific diagnosis, we recommended to proceed with video-assisted thoracoscopy,  drainage of pleural effusion, pleural biopsies, and consideration of talc pleurodesis.  The patient agreed and  signed informed consent.  DESCRIPTION OF PROCEDURE:  The patient underwent general endotracheal anesthesia without incident.  Appropriate timeout was performed through the single-lumen endotracheal tube.  Fiberoptic bronchoscopy was carried out to the subsegmental level in both  the right and left tracheobronchial tree without evidence of endobronchial lesions.  The scope was removed.  A double-lumen endotracheal tube was then placed by anesthesia.  Dr. Glennon Mac placed a TEE probe, and we again looked at the small fluid  collection in the pericardium posteriorly, approximately 2 cm in size.  At this point, with the small size of this, we did not think it warranted opening the pericardium in a patient that had previously had coronary bypass.  He was then turned in lateral  decubitus position with the left side up.  The left chest was prepped with Betadine, draped in a sterile manner.  A second timeout was performed.  We then created a 5 mm port incision approximately mid axillary line, 7th intercostal space.  Through  this, a 5 mm 30-degree scope was placed.  There was very mild inflammation of the pleural surfaces.  There was 1.5 L of pleural fluid present.  This fluid was without loculations and was easily removed.  A portion sent for cytology, chemistry, cell  count, and cultures.  There were adhesions of the lung at the apex which were left in place.  The remainder of the lung was relatively free of adhesions.  Through a second port site, we were able to manipulate the lung and also obtained several random  pleural biopsies.  Four grams of sterile talcum powder was then insufflated into the chest.  A 28 chest tube was left in place  through 1 port incision.  The port incision was closed.  The lung reinflated nicely without air leak.  Chest tube was secured  in place.  The other port incision was closed with a UR-6 suture in the muscle layer and a 3-0 subcuticular stitch in the skin edge.  The patient was  awakened and extubated in the operating room.  Sponge and needle count was reported as correct at  completion of the procedure.  The patient tolerated the procedure without obvious complication.  Blood loss was nil.  1500 mL of pleural fluid were removed.  RF scanning reported clear code.  The patient was extubated in the operating room and  transferred to the recovery room for postoperative care.  LN/NUANCE  D:04/13/2019 T:04/13/2019 JOB:[id number]

## 2019-04-14 ENCOUNTER — Inpatient Hospital Stay (HOSPITAL_COMMUNITY): Payer: Medicare Other

## 2019-04-14 LAB — GLUCOSE, CAPILLARY
Glucose-Capillary: 108 mg/dL — ABNORMAL HIGH (ref 70–99)
Glucose-Capillary: 89 mg/dL (ref 70–99)
Glucose-Capillary: 86 mg/dL (ref 70–99)

## 2019-04-14 LAB — PATHOLOGIST SMEAR REVIEW

## 2019-04-14 NOTE — Plan of Care (Signed)

## 2019-04-14 NOTE — Progress Notes (Addendum)
      [address]       [place] [address]             [phone]      2 Days Post-Op Procedure(s) (LRB): VIDEO BRONCHOSCOPY (N/A) VIDEO ASSISTED THORACOSCOPY (Left) TALC PLEURADESIS (Left) Transesophageal Echocardiogram (Tee) Pleural Biopsy (Left) Drainage Of Pleural Effusion (Left) Subjective: Doing well this morning. No complaints.   Objective: Vital signs in last 24 hours: Temp:  [97.7 F (36.5 C)-98.4 F (36.9 C)] 97.7 F (36.5 C) (07/23 0725) Pulse Rate:  [65-86] 86 (07/23 0523) Cardiac Rhythm: Normal sinus rhythm (07/23 0725) Resp:  [16-27] 18 (07/23 0741) BP: (122-134)/(66-79) 122/67 (07/23 0725) SpO2:  [93 %-98 %] 98 % (07/23 0725)     Intake/Output from previous day: 07/22 0701 - 07/23 0700 In: 860.2 [P.O.:240; I.V.:620.2] Out: 1105 [Urine:775; Chest Tube:330] Intake/Output this shift: No intake/output data recorded.  General appearance: alert, cooperative and no distress Heart: regular rate and rhythm, S1, S2 normal, no murmur, click, rub or gallop Lungs: clear to auscultation bilaterally Abdomen: soft, non-tender; bowel sounds normal; no masses,  no organomegaly Extremities: extremities normal, atraumatic, no cyanosis or edema Wound: clean and dry  Lab Results: Recent Labs    04/12/19 0700 04/13/19 0430  WBC 7.3 15.3*  HGB 15.3 14.5  HCT 47.9 44.3  PLT 153 152   BMET:  Recent Labs    04/12/19 0700 04/13/19 0430  NA 141 139  K 3.6 3.8  CL 105 101  CO2 25 28  GLUCOSE 92 138*  BUN 24* 19  CREATININE 1.34* 1.26*  CALCIUM 9.2 8.8*    PT/INR:  Recent Labs    04/12/19 0700  LABPROT 14.0  INR 1.1   ABG    Component Value Date/Time   PHART 7.422 04/13/2019 0304   HCO3 29.0 (H) 04/13/2019 0304   TCO2 27 10/21/2018 0531   ACIDBASEDEF 1.0 12/11/2010 1902   O2SAT 97.2 04/13/2019 0304   CBG (last 3)  Recent Labs    04/13/19 1930 04/13/19 2347 04/14/19 0415  GLUCAP 101* 108* 89    Assessment/Plan: S/P  Procedure(s) (LRB): VIDEO BRONCHOSCOPY (N/A) VIDEO ASSISTED THORACOSCOPY (Left) TALC PLEURADESIS (Left) Transesophageal Echocardiogram (Tee) Pleural Biopsy (Left) Drainage Of Pleural Effusion (Left)  1. NSR in the 70s. BP well controlled. 2. Chest tube put out 330cc/24 hours-keep. CXR showed persistent dense opacity at the left lung base. Trace left apical pneumothorax. 3. Renal-creatinine 1.26, electrolytes okay 4. H and H 14.5/44.3, expected acute blood loss anemia, stable 5. Endo-blood glucose level 101, 108, 89-well controlled 6. Continue lovenox for DVT prophylaxis  Plan: Keep chest tube for now. Continue to encourage incentive spirometer. Encouraged ambulation. Hopefully chest tube out tomorrow and maybe home over the weekend.     LOS: 2 days    Elgie Collard 04/14/2019  I have seen and examined [name] and agree with the above assessment  and plan.  Grace Isaac MD Beeper [phone] Office [phone] 04/14/2019 5:35 PM

## 2019-04-14 NOTE — Progress Notes (Signed)
Paged and talked with PA, Tessa regarding q4 CBG check up, will be discontinued as pt's CBG running normal  Palma Holter, Therapist, sports

## 2019-04-15 ENCOUNTER — Inpatient Hospital Stay (HOSPITAL_COMMUNITY): Payer: Medicare Other

## 2019-04-15 MED ORDER — FENTANYL 40 MCG/ML IV SOLN: INTRAVENOUS | Status: DC

## 2019-04-15 NOTE — Progress Notes (Addendum)
      [address]       [place] [address]             [phone]      3 Days Post-Op Procedure(s) (LRB): VIDEO BRONCHOSCOPY (N/A) VIDEO ASSISTED THORACOSCOPY (Left) TALC PLEURADESIS (Left) Transesophageal Echocardiogram (Tee) Pleural Biopsy (Left) Drainage Of Pleural Effusion (Left) Subjective: Feels good this morning. No complaints.   Objective: Vital signs in last 24 hours: Temp:  [97.7 F (36.5 C)-98.2 F (36.8 C)] 98 F (36.7 C) (07/24 0727) Pulse Rate:  [70-78] 78 (07/23 1928) Cardiac Rhythm: Normal sinus rhythm;Bundle branch block;Heart block (07/24 0343) Resp:  [18-28] 26 (07/24 0727) BP: (120-137)/(57-78) 120/57 (07/24 0727) SpO2:  [94 %-98 %] 94 % (07/24 0332)  Hemodynamic parameters for last 24 hours:    Intake/Output from previous day: 07/23 0701 - 07/24 0700 In: 714.3 [P.O.:480; I.V.:234.3] Out: 1110 [Urine:900; Chest Tube:210] Intake/Output this shift: No intake/output data recorded.  General appearance: alert, cooperative and no distress Heart: regular rate and rhythm, S1, S2 normal, no murmur, click, rub or gallop Lungs: clear to auscultation bilaterally Abdomen: soft, non-tender; bowel sounds normal; no masses,  no organomegaly Extremities: extremities normal, atraumatic, no cyanosis or edema Wound: clean and dry  Lab Results: Recent Labs    04/13/19 0430  WBC 15.3*  HGB 14.5  HCT 44.3  PLT 152   BMET:  Recent Labs    04/13/19 0430  NA 139  K 3.8  CL 101  CO2 28  GLUCOSE 138*  BUN 19  CREATININE 1.26*  CALCIUM 8.8*    PT/INR: No results for input(s): LABPROT, INR in the last 72 hours. ABG    Component Value Date/Time   PHART 7.422 04/13/2019 0304   HCO3 29.0 (H) 04/13/2019 0304   TCO2 27 10/21/2018 0531   ACIDBASEDEF 1.0 12/11/2010 1902   O2SAT 97.2 04/13/2019 0304   CBG (last 3)  Recent Labs    04/13/19 2347 04/14/19 0415 04/14/19 1138  GLUCAP 108* 89 86    Assessment/Plan: S/P Procedure(s)  (LRB): VIDEO BRONCHOSCOPY (N/A) VIDEO ASSISTED THORACOSCOPY (Left) TALC PLEURADESIS (Left) Transesophageal Echocardiogram (Tee) Pleural Biopsy (Left) Drainage Of Pleural Effusion (Left)  1. NSR in the 70s. BP well controlled. 2. Chest tube put out 210cc/24 hours. Await CXR. 3. Renal-creatinine 1.26, electrolytes okay 4. H and H 14.5/44.3, expected acute blood loss anemia, stable 5. Endo-blood glucose level 101, 108, 89-well controlled 6. Continue lovenox for DVT prophylaxis  Plan: Feels good this morning. Probably can remove chest tube later today after CXR has been reviewed. Will discontinue PCA after chest tube removal. Likely home tomorrow and can restart Eliquis BID on discharge.     LOS: 3 days    Eric Gordon 04/15/2019  Chest xray reviewed this am, plan chest tube removal today Pa and lateral chest xray  in am and poss home in am Resume Eliquis at d/c  I have seen and examined Eric Gordon and agree with the above assessment  and plan.  Grace Isaac MD Beeper [phone] Office [phone] 04/15/2019 9:25 AM

## 2019-04-16 ENCOUNTER — Inpatient Hospital Stay (HOSPITAL_COMMUNITY): Payer: Medicare Other

## 2019-04-16 MED ORDER — OXYCODONE-ACETAMINOPHEN 5-325 MG PO TABS: 1.0000 | ORAL_TABLET | ORAL | 0 refills | Status: AC | PRN

## 2019-04-16 NOTE — Progress Notes (Signed)
Pt left for home in wheelchair accompanied by nurse.

## 2019-04-16 NOTE — Progress Notes (Signed)
4 Days Post-Op Procedure(s) (LRB): VIDEO BRONCHOSCOPY (N/A) VIDEO ASSISTED THORACOSCOPY (Left) TALC PLEURADESIS (Left) Transesophageal Echocardiogram (Tee) Pleural Biopsy (Left) Drainage Of Pleural Effusion (Left) Subjective: Feels good, denies shortness of breath on RA. Ambulating independently and tolerating diet with no trouble.   Objective: Vital signs in last 24 hours: Temp:  [97.6 F (36.4 C)-97.9 F (36.6 C)] 97.7 F (36.5 C) (07/25 0743) Pulse Rate:  [67-70] 67 (07/24 2321) Cardiac Rhythm: Normal sinus rhythm;Bundle branch block;Heart block (07/25 0400) Resp:  [16-25] 21 (07/25 0743) BP: (108-140)/(63-77) 115/63 (07/25 0743) SpO2:  [98 %] 98 % (07/25 0400)  Hemodynamic parameters for last 24 hours:    Intake/Output from previous day: 07/24 0701 - 07/25 0700 In: 379.1 [P.O.:240; I.V.:139.1] Out: -  Intake/Output this shift: No intake/output data recorded.  General appearance: alert, cooperative and no distress Neurologic: intact Heart: regular rate and rhythm Lungs: clear to auscultation bilaterally. The PA/LAT CXR obtained this am was reviewed and shows a tiny left apical PTX  sith some apical scarring that is stable. Wound: Chest tube exit site is covered with an intact dry dressing.  Lab Results: No results for input(s): WBC, HGB, HCT, PLT in the last 72 hours. BMET: No results for input(s): NA, K, CL, CO2, GLUCOSE, BUN, CREATININE, CALCIUM in the last 72 hours.  PT/INR: No results for input(s): LABPROT, INR in the last 72 hours. ABG    Component Value Date/Time   PHART 7.422 04/13/2019 0304   HCO3 29.0 (H) 04/13/2019 0304   TCO2 27 10/21/2018 0531   ACIDBASEDEF 1.0 12/11/2010 1902   O2SAT 97.2 04/13/2019 0304   CBG (last 3)  Recent Labs    04/13/19 2347 04/14/19 0415 04/14/19 1138  GLUCAP 108* 89 86    Assessment/Plan: S/P Procedure(s) (LRB): VIDEO BRONCHOSCOPY (N/A) VIDEO ASSISTED THORACOSCOPY (Left) TALC PLEURADESIS (Left) Transesophageal  Echocardiogram (Tee) Pleural Biopsy (Left) Drainage Of Pleural Effusion (Left)  -POD4 VATS, pleurodesis for a recurrent pleural effusion. No significant re-accumulation of fluid following removal of the CT yesterday. Respiratory status stable. Plan to discharge today. Instructions given. To resume Eliquis for history of atrial fibrillation.     LOS: 4 days    Antony Odea, PA-C [phone] 04/16/2019

## 2019-04-17 LAB — CULTURE, BODY FLUID W GRAM STAIN -BOTTLE: Culture: NO GROWTH

## 2019-04-25 ENCOUNTER — Ambulatory Visit
Admission: RE | Admit: 2019-04-25 | Discharge: 2019-04-25 | Disposition: A | Discharge status: Home / Self Care | Payer: Medicare Other | Source: Ambulatory Visit | Attending: Cardiothoracic Surgery | Admitting: Cardiothoracic Surgery

## 2019-04-25 ENCOUNTER — Ambulatory Visit (INDEPENDENT_AMBULATORY_CARE_PROVIDER_SITE_OTHER): Payer: Self-pay | Admitting: Physician Assistant

## 2019-04-25 ENCOUNTER — Other Ambulatory Visit: Payer: Self-pay

## 2019-04-25 VITALS — BP 131/80 | HR 70 | Temp 97.3°F | Resp 16 | Ht 75.0 in | Wt 202.0 lb

## 2019-04-25 DIAGNOSIS — Z09 Encounter for follow-up examination after completed treatment for conditions other than malignant neoplasm: Secondary | ICD-10-CM

## 2019-04-25 DIAGNOSIS — J90 Pleural effusion, not elsewhere classified: Secondary | ICD-10-CM | POA: Diagnosis not present

## 2019-04-25 NOTE — Progress Notes (Signed)
HPI:  Patient returns for routine postoperative follow-up having undergone Video Bronchoscopy, VATS with drainage of pleural effusion, talc pleurodesis and pleural biopsy.  The patient's early postoperative recovery while in the hospital was as expected.   Since hospital discharge the patient reports he is doing well.  He doesn't continue to have some shortness of breath.  He also at times gets lightheaded when he gets up to walk around.  He is taking lasix daily.   Current Outpatient Medications  Medication Sig Dispense Refill  . acetaminophen (TYLENOL) 500 MG tablet Take 1,000 mg by mouth every 6 (six) hours as needed for moderate pain or headache.    Marland Kitchen apixaban (ELIQUIS) 5 MG TABS tablet Take 1 tablet (5 mg total) by mouth 2 (two) times daily. 60 tablet 5  . Ascorbic Acid 500 MG CAPS Take 500 mg by mouth daily.    Marland Kitchen atorvastatin (LIPITOR) 80 MG tablet Take 1 tablet (80 mg total) by mouth daily. 30 tablet 6  . carvedilol (COREG) 3.125 MG tablet Take 1 tablet (3.125 mg total) by mouth 2 (two) times daily. 60 tablet 11  . cetirizine (ZYRTEC) 10 MG tablet Take 10 mg by mouth daily.      . cholecalciferol (VITAMIN D) 1000 UNITS tablet Take 1,000 Units by mouth every evening.     . furosemide (LASIX) 40 MG tablet Take 1 tablet (40 mg total) by mouth daily.    . isosorbide mononitrate (IMDUR) 30 MG 24 hr tablet Take 2 tablets (60 mg total) by mouth daily.    Marland Kitchen levothyroxine (SYNTHROID, LEVOTHROID) 50 MCG tablet Take 50 mcg by mouth daily.    . Multiple Vitamins-Minerals (CENTRUM SILVER PO) Take 1 tablet by mouth daily.     . pantoprazole (PROTONIX) 40 MG tablet Take 40 mg by mouth daily.     . tamsulosin (FLOMAX) 0.4 MG CAPS capsule Take 0.4 mg by mouth daily after supper.      No current facility-administered medications for this visit.     Physical Exam:  BP 131/80 (BP Location: Left Arm, Patient Position: Sitting, Cuff Size: Normal)   Pulse 70   Temp (!) 97.3 F (36.3 C) Comment: THERMAL   Resp 16   Ht 6\' 3"  (1.905 m)   Wt 202 lb (91.6 kg)   SpO2 97% Comment: ON RA  BMI 25.25 kg/m   Gen: no apparent distress Heart: RRR Lungs: mildly diminished left base Ext: mild edema Incisions: well healed  Diagnostic Tests:  CXR;  No pneumothorax, increase in left sided pleural effusion remains small  A/P:  1. S/p Left VATS with drainage of effusion, Talc pleurodesis, pleural biopsy-  All pathology/cytology is negative... CXR shows some re-accumulation of left pleural fluid, no pneumothorax is present 2. Lightheadedness- HR, BP are within normal range, would continue all medications at current dosing.  If problem persists, may require adjustment at AHF follow up appointment 3. RTC in 2 weeks with CXR with EBG  [place] Handler, PA-C [facility] [phone]

## 2019-05-11 ENCOUNTER — Other Ambulatory Visit: Payer: Self-pay | Admitting: Cardiothoracic Surgery

## 2019-05-11 DIAGNOSIS — J90 Pleural effusion, not elsewhere classified: Secondary | ICD-10-CM

## 2019-05-12 ENCOUNTER — Ambulatory Visit (HOSPITAL_COMMUNITY)
Admission: RE | Admit: 2019-05-12 | Discharge: 2019-05-12 | Disposition: A | Discharge status: Home / Self Care | Payer: Medicare Other | Source: Ambulatory Visit | Attending: Internal Medicine | Admitting: Internal Medicine

## 2019-05-12 ENCOUNTER — Ambulatory Visit
Admission: RE | Admit: 2019-05-12 | Discharge: 2019-05-12 | Disposition: A | Discharge status: Home / Self Care | Payer: Medicare Other | Source: Ambulatory Visit | Attending: Cardiothoracic Surgery | Admitting: Cardiothoracic Surgery

## 2019-05-12 ENCOUNTER — Encounter: Payer: Self-pay | Admitting: Cardiothoracic Surgery

## 2019-05-12 ENCOUNTER — Other Ambulatory Visit: Payer: Self-pay

## 2019-05-12 ENCOUNTER — Encounter (HOSPITAL_COMMUNITY): Payer: Self-pay

## 2019-05-12 ENCOUNTER — Ambulatory Visit (INDEPENDENT_AMBULATORY_CARE_PROVIDER_SITE_OTHER): Payer: Self-pay | Admitting: Cardiothoracic Surgery

## 2019-05-12 VITALS — BP 100/60 | HR 57 | Temp 97.3°F | Resp 16 | Ht 75.0 in | Wt 202.0 lb

## 2019-05-12 VITALS — BP 124/86 | HR 78 | Wt 198.2 lb

## 2019-05-12 DIAGNOSIS — E785 Hyperlipidemia, unspecified: Secondary | ICD-10-CM | POA: Diagnosis not present

## 2019-05-12 DIAGNOSIS — J9383 Other pneumothorax: Secondary | ICD-10-CM | POA: Insufficient documentation

## 2019-05-12 DIAGNOSIS — Z79890 Hormone replacement therapy (postmenopausal): Secondary | ICD-10-CM | POA: Diagnosis not present

## 2019-05-12 DIAGNOSIS — R079 Chest pain, unspecified: Secondary | ICD-10-CM

## 2019-05-12 DIAGNOSIS — J90 Pleural effusion, not elsewhere classified: Secondary | ICD-10-CM

## 2019-05-12 DIAGNOSIS — I252 Old myocardial infarction: Secondary | ICD-10-CM | POA: Diagnosis not present

## 2019-05-12 DIAGNOSIS — I350 Nonrheumatic aortic (valve) stenosis: Secondary | ICD-10-CM | POA: Diagnosis not present

## 2019-05-12 DIAGNOSIS — Z79899 Other long term (current) drug therapy: Secondary | ICD-10-CM | POA: Insufficient documentation

## 2019-05-12 DIAGNOSIS — R0789 Other chest pain: Secondary | ICD-10-CM

## 2019-05-12 DIAGNOSIS — Z09 Encounter for follow-up examination after completed treatment for conditions other than malignant neoplasm: Secondary | ICD-10-CM

## 2019-05-12 DIAGNOSIS — Z8249 Family history of ischemic heart disease and other diseases of the circulatory system: Secondary | ICD-10-CM | POA: Insufficient documentation

## 2019-05-12 DIAGNOSIS — J45909 Unspecified asthma, uncomplicated: Secondary | ICD-10-CM | POA: Insufficient documentation

## 2019-05-12 DIAGNOSIS — R42 Dizziness and giddiness: Secondary | ICD-10-CM | POA: Insufficient documentation

## 2019-05-12 DIAGNOSIS — I714 Abdominal aortic aneurysm, without rupture: Secondary | ICD-10-CM | POA: Diagnosis not present

## 2019-05-12 DIAGNOSIS — I25810 Atherosclerosis of coronary artery bypass graft(s) without angina pectoris: Secondary | ICD-10-CM | POA: Insufficient documentation

## 2019-05-12 DIAGNOSIS — I5032 Chronic diastolic (congestive) heart failure: Secondary | ICD-10-CM | POA: Insufficient documentation

## 2019-05-12 DIAGNOSIS — R229 Localized swelling, mass and lump, unspecified: Secondary | ICD-10-CM | POA: Diagnosis not present

## 2019-05-12 DIAGNOSIS — I130 Hypertensive heart and chronic kidney disease with heart failure and stage 1 through stage 4 chronic kidney disease, or unspecified chronic kidney disease: Secondary | ICD-10-CM | POA: Insufficient documentation

## 2019-05-12 DIAGNOSIS — N183 Chronic kidney disease, stage 3 (moderate): Secondary | ICD-10-CM | POA: Diagnosis not present

## 2019-05-12 DIAGNOSIS — Z951 Presence of aortocoronary bypass graft: Secondary | ICD-10-CM | POA: Insufficient documentation

## 2019-05-12 DIAGNOSIS — E039 Hypothyroidism, unspecified: Secondary | ICD-10-CM | POA: Insufficient documentation

## 2019-05-12 DIAGNOSIS — R9431 Abnormal electrocardiogram [ECG] [EKG]: Secondary | ICD-10-CM | POA: Insufficient documentation

## 2019-05-12 DIAGNOSIS — I491 Atrial premature depolarization: Secondary | ICD-10-CM | POA: Insufficient documentation

## 2019-05-12 DIAGNOSIS — Z87891 Personal history of nicotine dependence: Secondary | ICD-10-CM | POA: Insufficient documentation

## 2019-05-12 DIAGNOSIS — Z7901 Long term (current) use of anticoagulants: Secondary | ICD-10-CM | POA: Insufficient documentation

## 2019-05-12 DIAGNOSIS — I480 Paroxysmal atrial fibrillation: Secondary | ICD-10-CM | POA: Insufficient documentation

## 2019-05-12 DIAGNOSIS — Z881 Allergy status to other antibiotic agents status: Secondary | ICD-10-CM | POA: Diagnosis not present

## 2019-05-12 DIAGNOSIS — I4510 Unspecified right bundle-branch block: Secondary | ICD-10-CM | POA: Diagnosis not present

## 2019-05-12 DIAGNOSIS — I6522 Occlusion and stenosis of left carotid artery: Secondary | ICD-10-CM | POA: Insufficient documentation

## 2019-05-12 DIAGNOSIS — G4733 Obstructive sleep apnea (adult) (pediatric): Secondary | ICD-10-CM | POA: Diagnosis not present

## 2019-05-12 LAB — BASIC METABOLIC PANEL
Anion gap: 9 (ref 5–15)
BUN: 18 mg/dL (ref 8–23)
CO2: 28 mmol/L (ref 22–32)
Calcium: 9.4 mg/dL (ref 8.9–10.3)
Chloride: 105 mmol/L (ref 98–111)
Creatinine, Ser: 1.21 mg/dL (ref 0.61–1.24)
GFR calc Af Amer: >60 mL/min (ref >60)
GFR calc non Af Amer: 54 mL/min — ABNORMAL LOW (ref >60)
Glucose, Bld: 97 mg/dL (ref 70–99)
Potassium: 3.9 mmol/L (ref 3.5–5.1)
Sodium: 142 mmol/L (ref 135–145)

## 2019-05-12 LAB — CBC
HCT: 46.2 % (ref 39.0–52.0)
Hemoglobin: 15 g/dL (ref 13.0–17.0)
MCH: 31.6 pg (ref 26.0–34.0)
MCHC: 32.5 g/dL (ref 30.0–36.0)
MCV: 97.3 fL (ref 80.0–100.0)
Platelets: 158 10*3/uL (ref 150–400)
RBC: 4.75 MIL/uL (ref 4.22–5.81)
RDW: 14.4 % (ref 11.5–15.5)
WBC: 6.2 10*3/uL (ref 4.0–10.5)
nRBC: 0 % (ref 0.0–0.2)

## 2019-05-12 LAB — TSH: TSH: 9.446 u[IU]/mL — ABNORMAL HIGH (ref 0.350–4.500)

## 2019-05-12 LAB — TROPONIN I (HIGH SENSITIVITY): Troponin I (High Sensitivity): 11 ng/L (ref <18)

## 2019-05-12 LAB — BRAIN NATRIURETIC PEPTIDE: B Natriuretic Peptide: 241.1 pg/mL — ABNORMAL HIGH (ref 0.0–100.0)

## 2019-05-12 LAB — SEDIMENTATION RATE: Sed Rate: 15 mm/hr (ref 0–16)

## 2019-05-12 LAB — MAGNESIUM: Magnesium: 2.1 mg/dL (ref 1.7–2.4)

## 2019-05-12 MED ORDER — FUROSEMIDE 40 MG PO TABS: 40.0000 mg | ORAL_TABLET | ORAL | Status: DC

## 2019-05-12 NOTE — Patient Instructions (Addendum)
CHANGE Lasix to 40mg , one tab every other day  Your physician recommends that you schedule a follow-up appointment in: keep follow up as scheduled with Dr Aundra Dubin  Do the following things EVERYDAY: 1) Weigh yourself in the morning before breakfast. Write it down and keep it in a log. 2) Take your medicines as prescribed 3) Eat low salt foods-Limit salt (sodium) to 2000 mg per day.  4) Stay as active as you can everyday 5) Limit all fluids for the day to less than 2 liters  At the [facility], you and your health needs are our priority. As part of our continuing mission to provide you with exceptional heart care, we have created designated Provider Care Teams. These Care Teams include your primary Cardiologist (physician) and Advanced Practice Providers (APPs- Physician Assistants and Nurse Practitioners) who all work together to provide you with the care you need, when you need it.   You may see any of the following providers on your designated Care Team at your next follow up: Marland Kitchen Dr Glori Bickers . Dr [name] . Darrick Grinder, NP   Please be sure to bring in all your medications bottles to every appointment.

## 2019-05-12 NOTE — Progress Notes (Signed)
[address]       [place] [address]             [phone]      Alan L Boese  Medical Record #[id number] Date of Birth: 25-May-1931  Referring: Lauraine Rinne, NP Primary Care: Shon Baton, MD Primary Cardiologist: No primary care provider on file.   Chief Complaint:   POST OP FOLLOW UP OPERATIVE REPORT DATE OF PROCEDURE:  04/12/2019 PREOPERATIVE DIAGNOSIS:  Recurrent left pleural effusions. POSTOPERATIVE DIAGNOSIS:  Recurrent left pleural effusions. SURGICAL PROCEDURE:  Bronchoscopy, left video-assisted thoracoscopy, drainage of pleural effusion with pleural biopsies and talc pleurodesis. SURGEON:  [name], MD  History of Present Illness:     Patient returns to the office today in routine follow-up after left video-assisted thoracoscopy drainage of a pleural effusion with pleural biopsy and talc pleurodesis.  The patient notes that over the past week or so he is had increasing dizziness weakness.  He notes he had been walking up to a mile a day, but he has stopped doing this because of feeling weak and dizzy.  He notes some anterior chest wall burning to the left, but not exertionally related.  Chest x-ray today appears stable over the past month Patient took Coreg and Imdur this morning.    Past Medical History:  Diagnosis Date  . Abdominal aortic aneurysm (AAA) ([place])   . Arthritis    "my whole body" (03/19/2018)  . Atrial fibrillation ([place])   . CAD (coronary artery disease)   . CHF (congestive heart failure) ([place])   . Chronic anticoagulation    PE  . Chronic lower back pain   . GERD (gastroesophageal reflux disease)   . High cholesterol   . Hypertension   . Hypothyroidism   . Myocardial infarction [facility])    2 in Jan. 2019  . OSA on CPAP    uses CPAP  . Pneumonia    "now and once before" (03/19/2018)  . Pulmonary embolism [facility])    April 2012 after CABG  . S/P CABG (coronary artery bypass graft)      Social  History   Tobacco Use  Smoking Status Former Smoker  . Packs/day: 1.00  . Years: 35.00  . Pack years: 35.00  . Types: Cigarettes  . Quit date: 02/18/1984  . Years since quitting: 35.2  Smokeless Tobacco Never Used    Social History   Substance and Sexual Activity  Alcohol Use Yes  . Alcohol/week: 2.0 standard drinks  . Types: 2 Standard drinks or equivalent per week     Allergies  Allergen Reactions  . Ancef [Cefazolin Sodium] Hives  . Vancomycin Rash    Current Outpatient Medications  Medication Sig Dispense Refill  . acetaminophen (TYLENOL) 500 MG tablet Take 1,000 mg by mouth every 6 (six) hours as needed for moderate pain or headache.    Marland Kitchen apixaban (ELIQUIS) 5 MG TABS tablet Take 1 tablet (5 mg total) by mouth 2 (two) times daily. 60 tablet 5  . Ascorbic Acid 500 MG CAPS Take 500 mg by mouth daily.    Marland Kitchen atorvastatin (LIPITOR) 80 MG tablet Take 1 tablet (80 mg total) by mouth daily. 30 tablet 6  . carvedilol (COREG) 3.125 MG tablet Take 1 tablet (3.125 mg total) by mouth 2 (two) times daily. 60 tablet 11  . cetirizine (ZYRTEC) 10 MG tablet Take 10 mg by mouth daily.      . cholecalciferol (VITAMIN D) 1000 UNITS  tablet Take 1,000 Units by mouth every evening.     . furosemide (LASIX) 40 MG tablet Take 1 tablet (40 mg total) by mouth daily.    . isosorbide mononitrate (IMDUR) 30 MG 24 hr tablet Take 2 tablets (60 mg total) by mouth daily.    Marland Kitchen levothyroxine (SYNTHROID, LEVOTHROID) 50 MCG tablet Take 50 mcg by mouth daily.    . Multiple Vitamins-Minerals (CENTRUM SILVER PO) Take 1 tablet by mouth daily.     . pantoprazole (PROTONIX) 40 MG tablet Take 40 mg by mouth daily.     . tamsulosin (FLOMAX) 0.4 MG CAPS capsule Take 0.4 mg by mouth daily after supper.      No current facility-administered medications for this visit.        Physical Exam: BP 100/60 (BP Location: Left Arm, Patient Position: Sitting, Cuff Size: Normal) Comment: HAS BEEN DIZZY THIS MORNING AND MORE  SOB  Pulse (!) 57   Temp (!) 97.3 F (36.3 C)   Resp 16   Ht 6\' 3"  (1.905 m)   Wt 202 lb (91.6 kg)   SpO2 95% Comment: RA  BMI 25.25 kg/m   General appearance: alert, cooperative, appears stated age and Patient appears fatigued and mildly short of breath at rest Neurologic: intact Heart: regular rate and rhythm, S1, S2 normal, no murmur, click, rub or gallop Lungs: diminished breath sounds bibasilar Abdomen: soft, non-tender; bowel sounds normal; no masses,  no organomegaly Extremities: extremities normal, atraumatic, no cyanosis or edema and Homans sign is negative, no sign of DVT Wound: Port sites are well-healed on the left   Diagnostic Studies & Laboratory data:     Recent Radiology Findings:   Dg Chest 2 View  Result Date: 05/12/2019 CLINICAL DATA:  History of VATS in July of 2020.  Pleural effusion. EXAM: CHEST - 2 VIEW COMPARISON:  04/25/2019 FINDINGS: Lungs are hyperexpanded. No pulmonary edema. Similar appearance of left pleural effusion with adjacent atelectasis. Patient is status post CABG. The visualized bony structures of the thorax are intact. Period IMPRESSION: Stable. No substantial change in left pleural effusion and left base atelectasis. Electronically Signed   By: Misty Stanley M.D.   On: 05/12/2019 09:28    I have independently reviewed the above radiology studies  and reviewed the findings with the patient.    Recent Lab Findings: Lab Results  Component Value Date   WBC 15.3 (H) 04/13/2019   HGB 14.5 04/13/2019   HCT 44.3 04/13/2019   PLT 152 04/13/2019   GLUCOSE 138 (H) 04/13/2019   CHOL 96 11/09/2018   TRIG 75 11/09/2018   HDL 29 (L) 11/09/2018   LDLCALC 52 11/09/2018   ALT 12 04/12/2019   AST 19 04/12/2019   NA 139 04/13/2019   K 3.8 04/13/2019   CL 101 04/13/2019   CREATININE 1.26 (H) 04/13/2019   BUN 19 04/13/2019   CO2 28 04/13/2019   TSH 6.979 (H) 11/22/2018   INR 1.1 04/12/2019   HGBA1C (H) 12/06/2010    5.8 (NOTE)                                                                        According to the ADA Clinical Practice Recommendations for 2011, when HbA1c  is used as a screening test:   >=6.5%   Diagnostic of Diabetes Mellitus           (if abnormal result  is confirmed)  5.7-6.4%   Increased risk of developing Diabetes Mellitus  References:Diagnosis and Classification of Diabetes Mellitus,Diabetes BMSX,1155,20(EYEMV 1):S62-S69 and Standards of Medical Care in         Diabetes - 2011,Diabetes VKPQ,2449,75  (Suppl 1):S11-S61.      Assessment / Plan:   #1 stable radiographic findings after left VATS drainage of effusion and talc pleurodesis #2 continued symptoms of chest discomfort fatigue dizziness, marginal blood pressure appears out of proportion to the radiographic findings.-Discussed with cardiology, patient to be seen in the heart failure clinic today for consideration of medication changes and further work-up.  We will plan to get a follow-up chest x-ray in 2 months.     Medication Changes: No orders of the defined types were placed in this encounter.     Grace Isaac MD      [address] ,[place] [address] Office [phone]   Beeper [phone]  05/12/2019 9:42 AM

## 2019-05-12 NOTE — Progress Notes (Signed)
HDate:  05/12/2019   ID:  Eric Gordon, DOB 01/02/1931, MRN [id number]   Provider location: [facility] Type of Visit: Established patient  PCP:  Shon Baton, MD  Cardiologist:  Dr. Aundra Dubin  Chief Complaint: Shortness of breath   History of Present Illness: YANG RACK is a 85 y.o. male with history of CAD s/p CABG.  He has had trouble long-term with exertional dyspnea.  Dyspnea triggered evaluation in 2012 leading to CABG but was not resolved by CABG.  PFTs in 3/14 showed only mild obstructive airways disease and V/Q scan showed no PE.  Echo in 9/14 showed normal LV systolic function with moderately dilated RV. [place] in 2/15 showed normal right and left heart filling pressures and normal PA pressure.  Finally, he had a CPX in 3/15 that showed normal capacity compared to age-matched sedentary norms.  He was noted to have chronotropic incompetence.  At a prior appointment, I took him off metoprolol given chronotropic incompetence noted on CPX.  Dyspnea improved significantly with weight loss.  He had Cardiolite in 8/18 with EF 52%, no ischemia or infarction.   He was admitted in 1/20 with NSTEMI.  LHC showed new occlusion of the branch of SVG-ramus and OM that touched down on OM.  There were also serial 70% stenoses in the mid/distal RCA.  There was not thought to be an interventional option and patient was treated medically. He was discharged home but presented a couple days later with recurrent chest pain and dyspnea.  Cath was repeated showing no change from prior.  This admission, he had FFR of the RCA which did not suggest hemodynamic significance.  Echo showed EF 55-60%, normal RV.  CTA did not show a PE. He was noted to be volume overloaded and was diuresed.  He was also noted to be in atrial fibrillation with RVR transiently.  ASA/Plavix was stopped and Eliquis was started. He was discharged to SNF.  Atrial fibrillation recurred and he was started on  amiodarone with conversion back to NSR.   In 4/20, he was admitted with left spontaneous PTX requiring chest tube.  After this, he developed recurrent left pleural effusions requiring thoracentesis x 3 so far.  Pleural fluid was exudative with eosinophil predominance.  With elevated ESR, there was concern that amiodarone could be involved, so this medication was stopped.  Patient has seen Dr. Servando Snare, plan for VATS versus Pleurex catheter.  CT chest in 6/20 showed LLL consolidation/collapse with small left effusion, there was a rim-enhancing lesion posterior to the heart between esophagus and left atrium, ?loculated fluid.   He was seen by Dr Aundra Dubin 03/18/19 and started on coreg 3.125 mg twice a day. He was set up for TEE to reassess left atrial mass. EF was 50-55% , left atrium was mildly dilated with no evidence of thrombus.   On 04/12/19 he was admitted with recurrent pleural effusion and underwent VATS, talc pleurodesis, pleural biopsy and drainage of effusion. Discharged on 04/16/19 on lasix 40 mg daily.   Today he returns for an acute work at the request of Dr Servando Snare with his wife. He saw Dr Servando Snare earlier this morning was complaining of weakness, dizziness, and left sided chest pain. The symptoms started this morning and remained consistent. SOB with exertion. No fever or chills. Appetite ok.He had been walking 1 mile most days but was unable to do that today. Weight at home 199-200 pounds.  No BRBPR. Taking all medications.  ECG (personally reviewed): NSR  65 bpm RBBB  Labs (2/15): K 4.2, creatinine 1.2, LDL 87, HDL 39, BNP 71 Labs (4/15): K 3.9, creatinine 1.1 Labs (12/15): LDL 73, HDL 37, K 3.9, creatinine 1.0 Labs (12/16): LDL 71, HDL 49 Labs (3/17): K 4.2, creatinine 1.28, HCT 45 Labs (8/18): LDL 82, HDL 41, TSH normal, hgb 16.3, K 4.1, creatinine 1.05 Labs (2/20): LFTs normal, pro BNP 842 Labs (3/20): K 3.8, creatinine 1.48, TSH mildly elevated at 6.97, free T3 and free T4 normal  Labs (4/20): K 3.9, creatinine 1.36  PMH: 1. Essential tremor 2. CAD: s/p CABG in 3/12.   - Cardiolite (8/18): EF 52%, no ischemia/infarction.  - NSTEMI (1/20):  LHC showed new occlusion of the branch of SVG-ramus and OM that touched down on OM.  There were also serial 70% stenoses in the mid/distal RCA.  FFR of RCA was negative.  3. Atrial fibrillation: Only noted post-op CABG.  4. PE: Post-op CABG in 2012.  5. AAA: 3.6 cm on [place] in 3/14.  3.6 cm on [place] in 3/15. 3.6 cm on [place] 8/16. 4.1 cm on [place] 4/17.  - AAA [place] (5/18): 4.1 cm AAA.  - AAA [place] (5/19): 4.1 cm AAA.  6. OSA: On CPAP.  7. Carotid stenosis: Carotid dopplers (7/93) with 90-30% LICA stenosis.  Carotid dopplers (0/92) with 33-00% LICA stenosis. Carotid dopplers (3/16) with 60-79% RCIA stenosis, 76-22% LICA stenosis.  - Carotid dopplers (8/16) with 40-59% RICA stenosis, 63-33% LICA stenosis.  - Carotid dopplers (8/17) with 54-56% RICA, 25-63% LICA. - Carotid dopplers (9/18) with 89-37% RICA, 34-28% LICA.   - Carotid dopplers (9/19) with 76-81% RICA, 15-72% LICA.  8. Asthma 9. PNA x 2 10. Chronic diastolic CHF/dyspnea: Echo (9/14) with EF 60-65%, mild LVH, very mild AS with mean gradient 10 mmHg, RV moderately dilated.  PFTs (3/14) showed mild obstructive airways disease.  V/Q scan (3/14) with no PE.  ENT workup for upper respiratory causes was negative.  RHC (2/15) with mean RA 7, PA 32/12, mean PCWP 13, CI 3.18.  CPX (3/15) with peak VO2 16.4, VE/VCO2 33; normal when compared to age-matched sedentary normals; chronotropic incompetence was noted.  Dyspnea was improved with weight loss.  - Echo (8/17): EF 60-65%, mild AS.  - Echo (1/20): EF 55-60%, normal RV size and systolic function.  - Echo (6/20): EF 55%, mild LVH, mild AS, normal RV size and systolic function, ?LA mass or mass impinging on posterior LA.  11. HTN 12. Aortic stenosis: Mild.  13. Ascending aortic aneurysm: CTA chest with 4.4 cm ascending aorta in 1/20.  - 4.2 cm  ascending aorta on CT chest 6/20.  14. Atrial fibrillation: Paroxysmal.  15. CKD: Stage 3.  16. Left lung spontaneous PTX then recurrent left pleural effusion.  44. Zio patch (6/20): NSR with 1 short NSVT run and few short SVT runs, no atrial fibrillation.     Current Outpatient Medications  Medication Sig Dispense Refill  . acetaminophen (TYLENOL) 500 MG tablet Take 1,000 mg by mouth every 6 (six) hours as needed for moderate pain or headache.    Marland Kitchen apixaban (ELIQUIS) 5 MG TABS tablet Take 1 tablet (5 mg total) by mouth 2 (two) times daily. 60 tablet 5  . Ascorbic Acid 500 MG CAPS Take 500 mg by mouth daily.    Marland Kitchen atorvastatin (LIPITOR) 80 MG tablet Take 1 tablet (80 mg total) by mouth daily. 30 tablet 6  . carvedilol (COREG) 3.125 MG tablet Take 1  tablet (3.125 mg total) by mouth 2 (two) times daily. 60 tablet 11  . cetirizine (ZYRTEC) 10 MG tablet Take 10 mg by mouth daily.      . cholecalciferol (VITAMIN D) 1000 UNITS tablet Take 1,000 Units by mouth every evening.     . furosemide (LASIX) 40 MG tablet Take 1 tablet (40 mg total) by mouth daily.    . isosorbide mononitrate (IMDUR) 30 MG 24 hr tablet Take 2 tablets (60 mg total) by mouth daily.    Marland Kitchen levothyroxine (SYNTHROID, LEVOTHROID) 50 MCG tablet Take 50 mcg by mouth daily.    . Multiple Vitamins-Minerals (CENTRUM SILVER PO) Take 1 tablet by mouth daily.     . pantoprazole (PROTONIX) 40 MG tablet Take 40 mg by mouth daily.     . tamsulosin (FLOMAX) 0.4 MG CAPS capsule Take 0.4 mg by mouth daily after supper.      No current facility-administered medications for this encounter.     Allergies:   Ancef [cefazolin sodium] and Vancomycin   Social History:  The patient  reports that he quit smoking about 35 years ago. His smoking use included cigarettes. He has a 35.00 pack-year smoking history. He has never used smokeless tobacco. He reports current alcohol use of about 2.0 standard drinks of alcohol per week. He reports that he does not  use drugs.   Family History:  The patient's family history includes Coronary artery disease in his mother; Heart disease in his mother; Hypertension in his mother.   ROS:  Please see the history of present illness.   All other systems are personally reviewed and negative.  Vitals:   05/12/19 1022  BP: 124/86  Pulse: 78  SpO2: 94%   Wt Readings from Last 3 Encounters:  05/12/19 89.9 kg (198 lb 3.2 oz)  05/12/19 91.6 kg (202 lb)  04/25/19 91.6 kg (202 lb)     Exam:   Wt 89.9 kg (198 lb 3.2 oz)   BMI 24.77 kg/m  General:  Well appearing. No resp difficulty HEENT: normal Neck: supple. no JVD. Carotids 2+ bilat; no bruits. No lymphadenopathy or thryomegaly appreciated. Cor: PMI nondisplaced. Regular rate & rhythm. No rubs, gallops or murmurs. Lungs: clear Abdomen: soft, nontender, nondistended. No hepatosplenomegaly. No bruits or masses. Good bowel sounds. Extremities: no cyanosis, clubbing, rash, edema Neuro: alert & orientedx3, cranial nerves grossly intact. moves all 4 extremities w/o difficulty. Affect pleasant Skin: No rash not on abdomen/chest.    Recent Labs: 11/22/2018: TSH 6.979 01/13/2019: Magnesium 1.8 03/21/2019: B Natriuretic Peptide 222.7 04/12/2019: ALT 12 04/13/2019: BUN 19; Creatinine, Ser 1.26; Hemoglobin 14.5; Platelets 152; Potassium 3.8; Sodium 139  Personally reviewed   Wt Readings from Last 3 Encounters:  05/12/19 89.9 kg (198 lb 3.2 oz)  05/12/19 91.6 kg (202 lb)  04/25/19 91.6 kg (202 lb)      ASSESSMENT AND PLAN:  1. CAD: s/p CABG 3/12. NSTEMI 1/20, LHC showed new occlusion of the branch of SVG-ramus and OM that touched down on OM.  There were also serial 70% stenoses in the mid/distal RCA.  FFR of RCA was negative.  No interventional target. The chronic dull left-sided chest pain that he has now seems to be related to the left lung pleural disease.  - No ASA given Eliquis use.  - Continue Imdur 60 mg daily.  - Continue atorvastatin 80 daily.   -  Off ranolazine with prolonged QTc.  - Checked HS Trop today-->11. 2. Hyperlipidemia: Goal LDL < 70.   -  On atorvastatin 80 daily, good lipids in 3/20.  3. Chronic diastolic CHF: Echo 11/27/3666   EF 55-60%, mild LVH, normal RV. Chronic  - Volume status low. Change lasix to every other day. Continue Lasix 40 mg daily. BMET today.  4. Spontaneous left PTX with recurrent left-sided pleural effusion: S/P VATS, biopsy, and possible talc pleurodesis by Dr. Servando Snare.   5. Carotid stenosis: Repeat carotid dopplers in 9/20, moderate stenosis on prior studies.   6. AAA: Needs repeat AAA [place].   7. Aortic stenosis: Mild on echo today.   8. Atrial fibrillation: Paroxysmal. He is significantly symptomatic while in atrial fibrillation.  He is in NSR today.  He is off amiodarone due to concern for pulmonary toxicity.   - Continue Eliquis 5 mg bid. Denies bleeding.  9. LA mass versus mass impinging on LA:  10: Dizziness- Volumes status appears low. Change lasix to every other day.  11. Hypothyroidism- On levothyroxine. Check TSH with fatigue. TSH 9.446  Signed, Darrick Grinder, NP  05/12/2019  [facility] [place] [address] [phone] (office) [phone] (fax)  Patient seen and examined with Darrick Grinder, NP. We discussed all aspects of the encounter. I agree with the assessment and plan as stated above.   85 y/o male with with h/o CAD s/p CABG, diastolic HF and PAF. Underwent VATS, talc pleurodesis, pleural biopsy and drainage of left effusion several weeks ago. Had been doing quite well until the last 24 hours when he developed acute left sided chest pain and felt weak. No fevers or chills. No increased SOB.   Saw Dr. Servando Snare this am who got CXR which showed small residual left pleural effusion but otherwise stable. Referred here for acute work-in visit.   On exam NAD sats 94-95% JVP flat Cor Loletha Grayer regular  Lungs dull at right  base no rub Mildly tenderness over rib Ab soft NT/ND Ext warm no edema  ECG sinus brady RBBB. No acute changes  hsTrop 11 WBC 6.2 Renal function stable ESR 15  Overall looks pretty good. I suspect he is a bit on the dry side as he has been very vigilant about limiting his salt intake and weight is down several pounds. Otherwise I do not see any acute pathology. I have encouraged him to change his diuretics to as needed and increase fluid/salt intake for 1-2 days. We will check back up on him in a day or two. Let us know immediately if not improving.   Glori Bickers, MD  9:53 PM

## 2019-05-19 ENCOUNTER — Other Ambulatory Visit: Payer: Self-pay | Admitting: *Deleted

## 2019-05-19 DIAGNOSIS — J90 Pleural effusion, not elsewhere classified: Secondary | ICD-10-CM

## 2019-06-01 ENCOUNTER — Ambulatory Visit (HOSPITAL_COMMUNITY)
Admission: RE | Admit: 2019-06-01 | Discharge: 2019-06-01 | Disposition: A | Discharge status: Home / Self Care | Payer: Medicare Other | Source: Ambulatory Visit | Attending: Cardiology | Admitting: Cardiology

## 2019-06-01 ENCOUNTER — Other Ambulatory Visit: Payer: Self-pay

## 2019-06-01 ENCOUNTER — Other Ambulatory Visit (HOSPITAL_COMMUNITY): Payer: Self-pay | Admitting: Cardiology

## 2019-06-01 DIAGNOSIS — I714 Abdominal aortic aneurysm, without rupture, unspecified: Secondary | ICD-10-CM

## 2019-06-01 DIAGNOSIS — I6523 Occlusion and stenosis of bilateral carotid arteries: Secondary | ICD-10-CM | POA: Diagnosis not present

## 2019-06-02 DIAGNOSIS — Z23 Encounter for immunization: Secondary | ICD-10-CM | POA: Diagnosis not present

## 2019-06-07 ENCOUNTER — Ambulatory Visit (HOSPITAL_COMMUNITY)
Admission: RE | Admit: 2019-06-07 | Discharge: 2019-06-07 | Disposition: A | Discharge status: Home / Self Care | Payer: Medicare Other | Source: Ambulatory Visit | Attending: Cardiovascular Disease | Admitting: Cardiovascular Disease

## 2019-06-07 ENCOUNTER — Other Ambulatory Visit (HOSPITAL_COMMUNITY): Payer: Self-pay | Admitting: Cardiology

## 2019-06-07 ENCOUNTER — Other Ambulatory Visit: Payer: Self-pay

## 2019-06-07 DIAGNOSIS — I714 Abdominal aortic aneurysm, without rupture, unspecified: Secondary | ICD-10-CM

## 2019-06-17 ENCOUNTER — Ambulatory Visit (HOSPITAL_COMMUNITY)
Admission: RE | Admit: 2019-06-17 | Discharge: 2019-06-17 | Disposition: A | Discharge status: Home / Self Care | Payer: Medicare Other | Source: Ambulatory Visit | Attending: Cardiology | Admitting: Cardiology

## 2019-06-17 ENCOUNTER — Encounter (HOSPITAL_COMMUNITY): Payer: Self-pay | Admitting: Cardiology

## 2019-06-17 ENCOUNTER — Other Ambulatory Visit: Payer: Self-pay

## 2019-06-17 VITALS — BP 118/70 | HR 69 | Wt 200.4 lb

## 2019-06-17 DIAGNOSIS — E785 Hyperlipidemia, unspecified: Secondary | ICD-10-CM | POA: Diagnosis not present

## 2019-06-17 DIAGNOSIS — I4510 Unspecified right bundle-branch block: Secondary | ICD-10-CM | POA: Insufficient documentation

## 2019-06-17 DIAGNOSIS — I252 Old myocardial infarction: Secondary | ICD-10-CM | POA: Insufficient documentation

## 2019-06-17 DIAGNOSIS — Z87891 Personal history of nicotine dependence: Secondary | ICD-10-CM | POA: Insufficient documentation

## 2019-06-17 DIAGNOSIS — Z79899 Other long term (current) drug therapy: Secondary | ICD-10-CM | POA: Insufficient documentation

## 2019-06-17 DIAGNOSIS — R0609 Other forms of dyspnea: Secondary | ICD-10-CM | POA: Insufficient documentation

## 2019-06-17 DIAGNOSIS — I480 Paroxysmal atrial fibrillation: Secondary | ICD-10-CM | POA: Insufficient documentation

## 2019-06-17 DIAGNOSIS — R0789 Other chest pain: Secondary | ICD-10-CM | POA: Insufficient documentation

## 2019-06-17 DIAGNOSIS — I5032 Chronic diastolic (congestive) heart failure: Secondary | ICD-10-CM | POA: Insufficient documentation

## 2019-06-17 DIAGNOSIS — I350 Nonrheumatic aortic (valve) stenosis: Secondary | ICD-10-CM | POA: Diagnosis not present

## 2019-06-17 DIAGNOSIS — I2510 Atherosclerotic heart disease of native coronary artery without angina pectoris: Secondary | ICD-10-CM | POA: Insufficient documentation

## 2019-06-17 DIAGNOSIS — I714 Abdominal aortic aneurysm, without rupture: Secondary | ICD-10-CM | POA: Insufficient documentation

## 2019-06-17 DIAGNOSIS — J90 Pleural effusion, not elsewhere classified: Secondary | ICD-10-CM | POA: Diagnosis not present

## 2019-06-17 DIAGNOSIS — R0602 Shortness of breath: Secondary | ICD-10-CM | POA: Diagnosis present

## 2019-06-17 DIAGNOSIS — Z7901 Long term (current) use of anticoagulants: Secondary | ICD-10-CM | POA: Insufficient documentation

## 2019-06-17 DIAGNOSIS — Z951 Presence of aortocoronary bypass graft: Secondary | ICD-10-CM | POA: Diagnosis not present

## 2019-06-17 DIAGNOSIS — Z8249 Family history of ischemic heart disease and other diseases of the circulatory system: Secondary | ICD-10-CM | POA: Diagnosis not present

## 2019-06-17 DIAGNOSIS — J984 Other disorders of lung: Secondary | ICD-10-CM | POA: Diagnosis not present

## 2019-06-17 LAB — BASIC METABOLIC PANEL
Anion gap: 11 (ref 5–15)
BUN: 22 mg/dL (ref 8–23)
CO2: 25 mmol/L (ref 22–32)
Calcium: 9.2 mg/dL (ref 8.9–10.3)
Chloride: 106 mmol/L (ref 98–111)
Creatinine, Ser: 1.12 mg/dL (ref 0.61–1.24)
GFR calc Af Amer: >60 mL/min (ref >60)
GFR calc non Af Amer: 60 mL/min — ABNORMAL LOW (ref >60)
Glucose, Bld: 99 mg/dL (ref 70–99)
Potassium: 4 mmol/L (ref 3.5–5.1)
Sodium: 142 mmol/L (ref 135–145)

## 2019-06-17 LAB — BRAIN NATRIURETIC PEPTIDE: B Natriuretic Peptide: 243.6 pg/mL — ABNORMAL HIGH (ref 0.0–100.0)

## 2019-06-17 NOTE — Patient Instructions (Addendum)
No  Medication changes today!  Labs today We will only contact you if something comes back abnormal or we need to make some changes. Otherwise no news is good news!  Your physician recommends that you schedule a follow-up appointment in: 3 months with Dr Aundra Dubin.  At the [facility], you and your health needs are our priority. As part of our continuing mission to provide you with exceptional heart care, we have created designated Provider Care Teams. These Care Teams include your primary Cardiologist (physician) and Advanced Practice Providers (APPs- Physician Assistants and Nurse Practitioners) who all work together to provide you with the care you need, when you need it.   You may see any of the following providers on your designated Care Team at your next follow up: Marland Kitchen Dr Glori Bickers . Dr [name] . Darrick Grinder, NP   Please be sure to bring in all your medications bottles to every appointment.

## 2019-06-17 NOTE — Progress Notes (Signed)
ReDS Vest - 06/17/19 0948      ReDS Vest   Estimated volume prior to reading  Low    Fitting Posture  Sitting    Height Marker  Tall    Ruler Value  34   station D   Center Strip  Aligned    ReDS Value  25

## 2019-06-19 NOTE — Progress Notes (Signed)
HDate:  06/19/2019   ID:  Eric Gordon, DOB November 23, 1979, MRN [id number]   Provider location: [facility] Type of Visit: Established patient  PCP:  Shon Baton, MD  Cardiologist:  Dr. Aundra Dubin  Chief Complaint: Shortness of breath   History of Present Illness: Eric Gordon is a 85 y.o. male with history of CAD s/p CABG.  He has had trouble long-term with exertional dyspnea.  Dyspnea triggered evaluation in 2012 leading to CABG but was not resolved by CABG.  PFTs in 3/14 showed only mild obstructive airways disease and V/Q scan showed no PE.  Echo in 9/14 showed normal LV systolic function with moderately dilated RV. [place] in 2/15 showed normal right and left heart filling pressures and normal PA pressure.  Finally, he had a CPX in 3/15 that showed normal capacity compared to age-matched sedentary norms.  He was noted to have chronotropic incompetence.  At a prior appointment, I took him off metoprolol given chronotropic incompetence noted on CPX.  Dyspnea improved significantly with weight loss.  He had Cardiolite in 8/18 with EF 52%, no ischemia or infarction.   He was admitted in 1/20 with NSTEMI.  LHC showed new occlusion of the branch of SVG-ramus and OM that touched down on OM.  There were also serial 70% stenoses in the mid/distal RCA.  There was not thought to be an interventional option and patient was treated medically. He was discharged home but presented a couple days later with recurrent chest pain and dyspnea.  Cath was repeated showing no change from prior.  This admission, he had FFR of the RCA which did not suggest hemodynamic significance.  Echo showed EF 55-60%, normal RV.  CTA did not show a PE. He was noted to be volume overloaded and was diuresed.  He was also noted to be in atrial fibrillation with RVR transiently.  ASA/Plavix was stopped and Eliquis was started. He was discharged to SNF.  Atrial fibrillation recurred and he was started on  amiodarone with conversion back to NSR.   In 4/20, he was admitted with left spontaneous PTX requiring chest tube.  After this, he developed recurrent left pleural effusions requiring thoracentesis x 3 so far.  Pleural fluid was exudative with eosinophil predominance.  With elevated ESR, there was concern that amiodarone could be involved, so this medication was stopped.  CT chest in 6/20 showed LLL consolidation/collapse with small left effusion, there was a rim-enhancing lesion posterior to the heart between esophagus and left atrium, ?loculated fluid.  TEE was done in 7/20 and confirmed loculated pleural effusion behind the left atrium.  In 7/20, patient had VATS on the left.   Patient returns for followup of dyspnea and CAD. He has been stable recently.  He continues to have chronic exertional dyspnea but he is able to walk 1-1.25 miles/day (with mild dyspnea).  No exertional chest pain.  At times, he has mild left-sided chest tightness that is transient, this generally occurs at rest.  No lightheadedness, no palpitations, no orthopnea/PND.   REDS vest 25%  ECG (personally reviewed): NSR, LAFB, RBBB  Labs (2/15): K 4.2, creatinine 1.2, LDL 87, HDL 39, BNP 71 Labs (4/15): K 3.9, creatinine 1.1 Labs (12/15): LDL 73, HDL 37, K 3.9, creatinine 1.0 Labs (12/16): LDL 71, HDL 49 Labs (3/17): K 4.2, creatinine 1.28, HCT 45 Labs (8/18): LDL 82, HDL 41, TSH normal, hgb 16.3, K 4.1, creatinine 1.05 Labs (2/20): LFTs normal, pro BNP 842  Labs (3/20): K 3.8, creatinine 1.48, TSH mildly elevated at 6.97, free T3 and free T4 normal Labs (4/20): K 3.9, creatinine 1.36 Labs (2/20): LDL 52 Labs (8/20): BNP 241, creatinine 1.21  PMH: 1. Essential tremor 2. CAD: s/p CABG in 3/12.   - Cardiolite (8/18): EF 52%, no ischemia/infarction.  - NSTEMI (1/20):  LHC showed new occlusion of the branch of SVG-ramus and OM that touched down on OM.  There were also serial 70% stenoses in the mid/distal RCA.  FFR of  RCA was negative.  3. Atrial fibrillation: Only noted post-op CABG.  4. PE: Post-op CABG in 2012.  5. AAA: 3.6 cm on [place] in 3/14.  3.6 cm on [place] in 3/15. 3.6 cm on [place] 8/16. 4.1 cm on [place] 4/17.  - AAA [place] (5/18): 4.1 cm AAA.  - AAA [place] (5/19): 4.1 cm AAA.  - AAA [place] (4/20): 4.1 cm AAA.  6. OSA: On CPAP.  7. Carotid stenosis: Carotid dopplers (3/54) with 56-25% LICA stenosis.  Carotid dopplers (6/38) with 93-73% LICA stenosis. Carotid dopplers (3/16) with 60-79% RCIA stenosis, 42-87% LICA stenosis.  - Carotid dopplers (8/16) with 40-59% RICA stenosis, 68-11% LICA stenosis.  - Carotid dopplers (8/17) with 57-26% RICA, 20-35% LICA. - Carotid dopplers (9/18) with 59-74% RICA, 16-38% LICA.   - Carotid dopplers (9/19) with 45-36% RICA, 46-80% LICA.  - Carotid dopplers (9/20) with 60-79% BICA 8. Asthma 9. PNA x 2 10. Chronic diastolic CHF/dyspnea: Echo (9/14) with EF 60-65%, mild LVH, very mild AS with mean gradient 10 mmHg, RV moderately dilated.  PFTs (3/14) showed mild obstructive airways disease.  V/Q scan (3/14) with no PE.  ENT workup for upper respiratory causes was negative.  RHC (2/15) with mean RA 7, PA 32/12, mean PCWP 13, CI 3.18.  CPX (3/15) with peak VO2 16.4, VE/VCO2 33; normal when compared to age-matched sedentary normals; chronotropic incompetence was noted.  Dyspnea was improved with weight loss.  - Echo (8/17): EF 60-65%, mild AS.  - Echo (1/20): EF 55-60%, normal RV size and systolic function.  - Echo (6/20): EF 55%, mild LVH, mild AS, normal RV size and systolic function, ?LA mass or mass impinging on posterior LA.  - TEE (7/20): EF 50-55%, mild LVH, basal inferolateral aneurysm, normal RV size/systolic function, loculated pleural fluid behind LA.  11. HTN 12. Aortic stenosis: Mild.  13. Ascending aortic aneurysm: CTA chest with 4.4 cm ascending aorta in 1/20.  - 4.2 cm ascending aorta on CT chest 6/20.  14. Atrial fibrillation: Paroxysmal.  15. CKD: Stage 3.  16. Left lung  spontaneous PTX then recurrent left pleural effusion.  - Left VATS in 7/20.  60. Zio patch (6/20): NSR with 1 short NSVT run and few short SVT runs, no atrial fibrillation.    Current Outpatient Medications  Medication Sig Dispense Refill  . acetaminophen (TYLENOL) 500 MG tablet Take 1,000 mg by mouth every 6 (six) hours as needed for moderate pain or headache.    Marland Kitchen apixaban (ELIQUIS) 5 MG TABS tablet Take 1 tablet (5 mg total) by mouth 2 (two) times daily. 60 tablet 5  . Ascorbic Acid 500 MG CAPS Take 500 mg by mouth daily.    Marland Kitchen atorvastatin (LIPITOR) 80 MG tablet Take 1 tablet (80 mg total) by mouth daily. 30 tablet 6  . carvedilol (COREG) 3.125 MG tablet Take 1 tablet (3.125 mg total) by mouth 2 (two) times daily. 60 tablet 11  . cetirizine (ZYRTEC) 10 MG tablet Take 10  mg by mouth daily.      . cholecalciferol (VITAMIN D) 1000 UNITS tablet Take 1,000 Units by mouth every evening.     . furosemide (LASIX) 40 MG tablet Take 1 tablet (40 mg total) by mouth every other day. 30 tablet   . isosorbide mononitrate (IMDUR) 30 MG 24 hr tablet Take 2 tablets (60 mg total) by mouth daily.    Marland Kitchen levothyroxine (SYNTHROID, LEVOTHROID) 50 MCG tablet Take 50 mcg by mouth daily.    . Multiple Vitamins-Minerals (CENTRUM SILVER PO) Take 1 tablet by mouth daily.     . pantoprazole (PROTONIX) 40 MG tablet Take 40 mg by mouth daily.     . tamsulosin (FLOMAX) 0.4 MG CAPS capsule Take 0.4 mg by mouth daily after supper.      No current facility-administered medications for this encounter.     Allergies:   Ancef [cefazolin sodium] and Vancomycin   Social History:  The patient  reports that he quit smoking about 35 years ago. His smoking use included cigarettes. He has a 35.00 pack-year smoking history. He has never used smokeless tobacco. He reports current alcohol use of about 2.0 standard drinks of alcohol per week. He reports that he does not use drugs.   Family History:  The patient's family history  includes Coronary artery disease in his mother; Heart disease in his mother; Hypertension in his mother.   ROS:  Please see the history of present illness.   All other systems are personally reviewed and negative.   Exam:   BP 118/70   Pulse 69   Wt 90.9 kg (200 lb 6.4 oz)   SpO2 96%   BMI 25.05 kg/m  General: NAD Neck: No JVD, no thyromegaly or thyroid nodule.  Lungs: Clear to auscultation bilaterally with normal respiratory effort. CV: Nondisplaced PMI.  Heart regular S1/S2, no S3/S4, 1/6 SEM RUSB.  No peripheral edema.  No carotid bruit.  Normal pedal pulses.  Abdomen: Soft, nontender, no hepatosplenomegaly, no distention.  Skin: Intact without lesions or rashes.  Neurologic: Alert and oriented x 3.  Psych: Normal affect. Extremities: No clubbing or cyanosis.  HEENT: Normal.   Recent Labs: 04/12/2019: ALT 12 05/12/2019: Hemoglobin 15.0; Magnesium 2.1; Platelets 158; TSH 9.446 06/17/2019: B Natriuretic Peptide 243.6; BUN 22; Creatinine, Ser 1.12; Potassium 4.0; Sodium 142  Personally reviewed   Wt Readings from Last 3 Encounters:  06/17/19 90.9 kg (200 lb 6.4 oz)  05/12/19 89.9 kg (198 lb 3.2 oz)  05/12/19 91.6 kg (202 lb)      ASSESSMENT AND PLAN:  1. CAD: s/p CABG 3/12. NSTEMI 1/20, LHC showed new occlusion of the branch of SVG-ramus and OM that touched down on OM.  There were also serial 70% stenoses in the mid/distal RCA.  FFR of RCA was negative.  No interventional target.  Occasional atypical chdst pain.  - No ASA given Eliquis use.  - Continue Imdur 60 mg daily.  - Continue atorvastatin 80 daily.   - Off ranolazine with prolonged QTc.  2. Hyperlipidemia: Goal LDL < 70.   - On atorvastatin 80 daily, good lipids in 2/20.  3. Chronic diastolic CHF: TEE in 8/46 showed EF 50-55%, basal inferolateral aneurysm, and normal RV.  He is not volume overloaded on exam or by REDS vest reading.  I think that his dyspnea is related to his left lung disease. - Continue Lasix 40 mg  every other day.  BMET/BNP today.   4. Spontaneous left PTX with recurrent left-sided pleural  effusion:  He is s/p left VATS/pleurodesis in 7/20.   5. Carotid stenosis: Repeat carotid dopplers in 9/21, moderate stenosis on prior studies.   6. AAA: Stable 4.1 cm AAA, repeat US 9/21.   7. Aortic stenosis: Mild.   8. Atrial fibrillation: Paroxysmal. He is significantly symptomatic while in atrial fibrillation.  He is in NSR today.  He is off amiodarone due to concern for pulmonary toxicity.   - Continue Eliquis 5 mg bid. Denies bleeding.   Recommended follow-up:  3 months.  Signed, [name], MD  06/19/2019  [facility] [address] [facility] [place] [address] [phone] (office) [phone] (fax)

## 2019-07-14 ENCOUNTER — Encounter: Payer: Medicare Other | Admitting: Cardiothoracic Surgery

## 2019-07-21 ENCOUNTER — Ambulatory Visit (INDEPENDENT_AMBULATORY_CARE_PROVIDER_SITE_OTHER): Payer: Medicare Other | Admitting: Cardiothoracic Surgery

## 2019-07-21 ENCOUNTER — Other Ambulatory Visit: Payer: Self-pay

## 2019-07-21 ENCOUNTER — Encounter: Payer: Self-pay | Admitting: Cardiothoracic Surgery

## 2019-07-21 ENCOUNTER — Ambulatory Visit
Admission: RE | Admit: 2019-07-21 | Discharge: 2019-07-21 | Disposition: A | Discharge status: Home / Self Care | Payer: Medicare Other | Source: Ambulatory Visit | Attending: Cardiothoracic Surgery | Admitting: Cardiothoracic Surgery

## 2019-07-21 VITALS — BP 102/68 | HR 65 | Temp 97.5°F | Resp 16 | Ht 75.0 in | Wt 198.0 lb

## 2019-07-21 DIAGNOSIS — Z09 Encounter for follow-up examination after completed treatment for conditions other than malignant neoplasm: Secondary | ICD-10-CM | POA: Diagnosis not present

## 2019-07-21 DIAGNOSIS — J90 Pleural effusion, not elsewhere classified: Secondary | ICD-10-CM | POA: Diagnosis not present

## 2019-07-21 DIAGNOSIS — I6523 Occlusion and stenosis of bilateral carotid arteries: Secondary | ICD-10-CM | POA: Diagnosis not present

## 2019-07-21 DIAGNOSIS — Z951 Presence of aortocoronary bypass graft: Secondary | ICD-10-CM | POA: Diagnosis not present

## 2019-07-21 DIAGNOSIS — J9811 Atelectasis: Secondary | ICD-10-CM | POA: Diagnosis not present

## 2019-07-21 NOTE — Progress Notes (Signed)
[address]       [place] [address]             [phone]      Eric Gordon [facility] [id number] Date of Birth: [birth year]  Referring: Eric Rinne, NP Primary Care: Eric Baton, MD Primary Cardiologist: No primary care provider on file.   Chief Complaint:   POST OP FOLLOW UP OPERATIVE REPORT DATE OF PROCEDURE:  04/12/2019 PREOPERATIVE DIAGNOSIS:  Recurrent left pleural effusions. POSTOPERATIVE DIAGNOSIS:  Recurrent left pleural effusions. SURGICAL PROCEDURE:  Bronchoscopy, left video-assisted thoracoscopy, drainage of pleural effusion with pleural biopsies and talc pleurodesis. SURGEON:  Eric Bal, MD  History of Present Illness:       Patient returns to the office today in routine follow-up after left video-assisted thoracoscopy drainage of a pleural effusion with pleural biopsy and talc pleurodesis done July 2020.  Overall the patient feels much improved than on his last visit when he presented with increasing shortness of breath and chest pain.  He notes now that he is feeling better walking a mile to mile and a half a day and doing yard work around his house without significant shortness of breath or chest pain.   Past Medical History:  Diagnosis Date  . Abdominal aortic aneurysm (AAA) (Eric Gordon)   . Arthritis    "my whole body" (03/19/2018)  . Atrial fibrillation (Eric Gordon)   . CAD (coronary artery disease)   . CHF (congestive heart failure) (Eric Gordon)   . Chronic anticoagulation    PE  . Chronic lower back pain   . GERD (gastroesophageal reflux disease)   . High cholesterol   . Hypertension   . Hypothyroidism   . Myocardial infarction Eric Gordon)    2 in Jan. 2019  . OSA on CPAP    uses CPAP  . Pneumonia    "now and once before" (03/19/2018)  . Pulmonary embolism Eric Gordon)    April 2012 after CABG  . S/P CABG (coronary artery bypass graft)      Social History   Tobacco Use  Smoking Status Former Smoker  . Packs/day:  1.00  . Years: 35.00  . Pack years: 35.00  . Types: Cigarettes  . Quit date: 02/18/1984  . Years since quitting: 35.4  Smokeless Tobacco Never Used    Social History   Substance and Sexual Activity  Alcohol Use Yes  . Alcohol/week: 2.0 standard drinks  . Types: 2 Standard drinks or equivalent per week     Allergies  Allergen Reactions  . Ancef [Cefazolin Sodium] Hives  . Vancomycin Rash    Current Outpatient Medications  Medication Sig Dispense Refill  . acetaminophen (TYLENOL) 500 MG tablet Take 1,000 mg by mouth every 6 (six) hours as needed for moderate pain or headache.    Marland Kitchen apixaban (ELIQUIS) 5 MG TABS tablet Take 1 tablet (5 mg total) by mouth 2 (two) times daily. 60 tablet 5  . Ascorbic Acid 500 MG CAPS Take 500 mg by mouth daily.    Marland Kitchen atorvastatin (LIPITOR) 80 MG tablet Take 1 tablet (80 mg total) by mouth daily. 30 tablet 6  . carvedilol (COREG) 3.125 MG tablet Take 1 tablet (3.125 mg total) by mouth 2 (two) times daily. 60 tablet 11  . cetirizine (ZYRTEC) 10 MG tablet Take 10 mg by mouth daily.      . cholecalciferol (VITAMIN D) 1000 UNITS tablet Take 1,000 Units by mouth every evening.     Marland Kitchen  furosemide (LASIX) 40 MG tablet Take 1 tablet (40 mg total) by mouth every other day. 30 tablet   . isosorbide mononitrate (IMDUR) 30 MG 24 hr tablet Take 2 tablets (60 mg total) by mouth daily.    Marland Kitchen levothyroxine (SYNTHROID, LEVOTHROID) 50 MCG tablet Take 50 mcg by mouth daily.    . Multiple Vitamins-Minerals (CENTRUM SILVER PO) Take 1 tablet by mouth daily.     . pantoprazole (PROTONIX) 40 MG tablet Take 40 mg by mouth daily.     . tamsulosin (FLOMAX) 0.4 MG CAPS capsule Take 0.4 mg by mouth daily after supper.      No current facility-administered medications for this visit.        Physical Exam: BP 102/68 (BP Location: Right Arm, Patient Position: Sitting, Cuff Size: Normal)   Pulse 65   Temp (!) 97.5 F (36.4 C)   Resp 16   Ht 6\' 3"  (1.905 m)   Wt 198 lb (89.8  kg)   SpO2 95% Comment: RA  BMI 24.75 kg/m  General appearance: alert, cooperative and no distress Neck: no adenopathy, no carotid bruit, no JVD, supple, symmetrical, trachea midline and thyroid not enlarged, symmetric, no tenderness/mass/nodules Resp: diminished breath sounds LLL Cardio: irregularly irregular rhythm GI: soft, non-tender; bowel sounds normal; no masses,  no organomegaly Extremities: extremities normal, atraumatic, no cyanosis or edema Neurologic: Grossly normal Diagnostic Studies & Laboratory data:     Recent Radiology Findings:   Dg Chest 2 View  Result Date: 07/21/2019 CLINICAL DATA:  Pleural effusion post VATS EXAM: CHEST - 2 VIEW COMPARISON:  August 2020 FINDINGS: Similar appearance of left pleural effusion and adjacent atelectasis. Lungs are otherwise clear. Normal heart size. There is evidence of prior CABG. Calcified plaque is present along the aortic arch. IMPRESSION: No substantial change in left pleural effusion and adjacent atelectasis. Electronically Signed   By: Macy Mis M.D.   On: 07/21/2019 13:04    I have independently reviewed the above radiology studies  and reviewed the findings with the patient.    Recent Lab Findings: Lab Results  Component Value Date   WBC 6.2 05/12/2019   HGB 15.0 05/12/2019   HCT 46.2 05/12/2019   PLT 158 05/12/2019   GLUCOSE 99 06/17/2019   CHOL 96 11/09/2018   TRIG 75 11/09/2018   HDL 29 (L) 11/09/2018   LDLCALC 52 11/09/2018   ALT 12 04/12/2019   AST 19 04/12/2019   NA 142 06/17/2019   K 4.0 06/17/2019   CL 106 06/17/2019   CREATININE 1.12 06/17/2019   BUN 22 06/17/2019   CO2 25 06/17/2019   TSH 9.446 (H) 05/12/2019   INR 1.1 04/12/2019   HGBA1C (H) 12/06/2010    5.8 (NOTE)                                                                       According to the ADA Clinical Practice Recommendations for 2011, when HbA1c is used as a screening test:   >=6.5%   Diagnostic of Diabetes Mellitus           (if  abnormal result  is confirmed)  5.7-6.4%   Increased risk of developing Diabetes Mellitus  References:Diagnosis and Classification of Diabetes Mellitus,Diabetes Care,2011,34(Suppl 1):S62-S69  and Standards of Medical Care in         Diabetes - 2011,Diabetes [id number]  (Suppl 1):S11-S61.   Diagnosis Pleura, biopsy, Left - INFLAMED FIBROUS TISSUE WITH ANTHRACOSIS. - THERE IS NO EVIDENCE OF MALIGNANCY. Enid Cutter MD   Assessment / Plan:    Patient stable without evidence of recurrent left pleural effusion after left video-assisted thoracoscopy and talc pleurodesis.  At the time of thoracoscopy biopsies were done without evidence of malignancy.  Patient appears stable from a surgical standpoint, I have not made over turn appointment for him but would be glad to see him in his or Dr. Aundra Dubin 's requested anytime.     Medication Changes: No orders of the defined types were placed in this encounter.     Grace Isaac MD      [address] [place] [address] Office [phone]   Beeper [phone]  07/21/2019 1:36 PM

## 2019-07-22 NOTE — Progress Notes (Signed)
Chart and office note reviewed in detail  > agree with a/p as outlined    

## 2019-08-01 DIAGNOSIS — E039 Hypothyroidism, unspecified: Secondary | ICD-10-CM | POA: Diagnosis not present

## 2019-08-01 DIAGNOSIS — I6529 Occlusion and stenosis of unspecified carotid artery: Secondary | ICD-10-CM | POA: Diagnosis not present

## 2019-08-01 DIAGNOSIS — I2510 Atherosclerotic heart disease of native coronary artery without angina pectoris: Secondary | ICD-10-CM | POA: Diagnosis not present

## 2019-08-01 DIAGNOSIS — I714 Abdominal aortic aneurysm, without rupture: Secondary | ICD-10-CM | POA: Diagnosis not present

## 2019-08-01 DIAGNOSIS — I350 Nonrheumatic aortic (valve) stenosis: Secondary | ICD-10-CM | POA: Diagnosis not present

## 2019-08-01 DIAGNOSIS — I130 Hypertensive heart and chronic kidney disease with heart failure and stage 1 through stage 4 chronic kidney disease, or unspecified chronic kidney disease: Secondary | ICD-10-CM | POA: Diagnosis not present

## 2019-08-01 DIAGNOSIS — E1122 Type 2 diabetes mellitus with diabetic chronic kidney disease: Secondary | ICD-10-CM | POA: Diagnosis not present

## 2019-08-01 DIAGNOSIS — J90 Pleural effusion, not elsewhere classified: Secondary | ICD-10-CM | POA: Diagnosis not present

## 2019-08-01 DIAGNOSIS — N1831 Chronic kidney disease, stage 3a: Secondary | ICD-10-CM | POA: Diagnosis not present

## 2019-08-01 DIAGNOSIS — R627 Adult failure to thrive: Secondary | ICD-10-CM | POA: Diagnosis not present

## 2019-08-01 DIAGNOSIS — I5022 Chronic systolic (congestive) heart failure: Secondary | ICD-10-CM | POA: Diagnosis not present

## 2019-08-01 DIAGNOSIS — I480 Paroxysmal atrial fibrillation: Secondary | ICD-10-CM | POA: Diagnosis not present

## 2019-09-20 ENCOUNTER — Encounter (HOSPITAL_COMMUNITY): Payer: Medicare Other | Admitting: Cardiology

## 2019-10-24 ENCOUNTER — Telehealth (HOSPITAL_COMMUNITY): Payer: Self-pay

## 2019-10-24 NOTE — Telephone Encounter (Signed)

## 2019-10-25 ENCOUNTER — Ambulatory Visit (HOSPITAL_COMMUNITY)
Admission: RE | Admit: 2019-10-25 | Discharge: 2019-10-25 | Disposition: A | Discharge status: Home / Self Care | Payer: Medicare Other | Source: Ambulatory Visit | Attending: Cardiology | Admitting: Cardiology

## 2019-10-25 ENCOUNTER — Encounter (HOSPITAL_COMMUNITY): Payer: Medicare Other

## 2019-10-25 ENCOUNTER — Telehealth (HOSPITAL_COMMUNITY): Payer: Self-pay | Admitting: *Deleted

## 2019-10-25 ENCOUNTER — Other Ambulatory Visit: Payer: Self-pay

## 2019-10-25 VITALS — BP 122/82 | HR 69 | Wt 204.6 lb

## 2019-10-25 DIAGNOSIS — I2510 Atherosclerotic heart disease of native coronary artery without angina pectoris: Secondary | ICD-10-CM | POA: Insufficient documentation

## 2019-10-25 DIAGNOSIS — R0602 Shortness of breath: Secondary | ICD-10-CM

## 2019-10-25 DIAGNOSIS — I4510 Unspecified right bundle-branch block: Secondary | ICD-10-CM | POA: Diagnosis not present

## 2019-10-25 DIAGNOSIS — I5032 Chronic diastolic (congestive) heart failure: Secondary | ICD-10-CM | POA: Diagnosis not present

## 2019-10-25 DIAGNOSIS — I252 Old myocardial infarction: Secondary | ICD-10-CM | POA: Insufficient documentation

## 2019-10-25 DIAGNOSIS — I480 Paroxysmal atrial fibrillation: Secondary | ICD-10-CM | POA: Diagnosis not present

## 2019-10-25 DIAGNOSIS — M545 Low back pain: Secondary | ICD-10-CM | POA: Insufficient documentation

## 2019-10-25 DIAGNOSIS — I714 Abdominal aortic aneurysm, without rupture, unspecified: Secondary | ICD-10-CM

## 2019-10-25 DIAGNOSIS — Z79899 Other long term (current) drug therapy: Secondary | ICD-10-CM | POA: Insufficient documentation

## 2019-10-25 DIAGNOSIS — J90 Pleural effusion, not elsewhere classified: Secondary | ICD-10-CM

## 2019-10-25 DIAGNOSIS — E785 Hyperlipidemia, unspecified: Secondary | ICD-10-CM | POA: Insufficient documentation

## 2019-10-25 DIAGNOSIS — Z87891 Personal history of nicotine dependence: Secondary | ICD-10-CM | POA: Diagnosis not present

## 2019-10-25 DIAGNOSIS — Z7901 Long term (current) use of anticoagulants: Secondary | ICD-10-CM | POA: Diagnosis not present

## 2019-10-25 DIAGNOSIS — Z79890 Hormone replacement therapy (postmenopausal): Secondary | ICD-10-CM | POA: Insufficient documentation

## 2019-10-25 DIAGNOSIS — Z8249 Family history of ischemic heart disease and other diseases of the circulatory system: Secondary | ICD-10-CM | POA: Insufficient documentation

## 2019-10-25 DIAGNOSIS — I350 Nonrheumatic aortic (valve) stenosis: Secondary | ICD-10-CM | POA: Diagnosis not present

## 2019-10-25 DIAGNOSIS — Z951 Presence of aortocoronary bypass graft: Secondary | ICD-10-CM | POA: Diagnosis not present

## 2019-10-25 DIAGNOSIS — R55 Syncope and collapse: Secondary | ICD-10-CM | POA: Diagnosis not present

## 2019-10-25 DIAGNOSIS — I110 Hypertensive heart disease with heart failure: Secondary | ICD-10-CM | POA: Diagnosis not present

## 2019-10-25 DIAGNOSIS — G8929 Other chronic pain: Secondary | ICD-10-CM | POA: Diagnosis not present

## 2019-10-25 LAB — BASIC METABOLIC PANEL
Anion gap: 10 (ref 5–15)
BUN: 27 mg/dL — ABNORMAL HIGH (ref 8–23)
CO2: 27 mmol/L (ref 22–32)
Calcium: 9.8 mg/dL (ref 8.9–10.3)
Chloride: 105 mmol/L (ref 98–111)
Creatinine, Ser: 1.26 mg/dL — ABNORMAL HIGH (ref 0.61–1.24)
GFR calc Af Amer: 59 mL/min — ABNORMAL LOW (ref >60)
GFR calc non Af Amer: 51 mL/min — ABNORMAL LOW (ref >60)
Glucose, Bld: 86 mg/dL (ref 70–99)
Potassium: 4.3 mmol/L (ref 3.5–5.1)
Sodium: 142 mmol/L (ref 135–145)

## 2019-10-25 LAB — BRAIN NATRIURETIC PEPTIDE: B Natriuretic Peptide: 270.7 pg/mL — ABNORMAL HIGH (ref 0.0–100.0)

## 2019-10-25 NOTE — Progress Notes (Signed)
HDate:  10/25/2019   ID:  Eric Gordon, DOB 11-Dec-1930, MRN [id number]   Provider location: [facility] Type of Visit: Established patient  PCP:  Shon Baton, MD  Cardiologist:  Dr. Aundra Dubin  Chief Complaint: Shortness of breath   History of Present Illness: Eric Gordon is a 86 y.o. male with history of CAD s/p CABG.  He has had trouble long-term with exertional dyspnea.  Dyspnea triggered evaluation in 2012 leading to CABG but was not resolved by CABG.  PFTs in 3/14 showed only mild obstructive airways disease and V/Q scan showed no PE.  Echo in 9/14 showed normal LV systolic function with moderately dilated RV. Wiley in 2/15 showed normal right and left heart filling pressures and normal PA pressure.  Finally, he had a CPX in 3/15 that showed normal capacity compared to age-matched sedentary norms.  He was noted to have chronotropic incompetence.  At a prior appointment, I took him off metoprolol given chronotropic incompetence noted on CPX.  Dyspnea improved significantly with weight loss.  He had Cardiolite in 8/18 with EF 52%, no ischemia or infarction.   He was admitted in 1/20 with NSTEMI.  LHC showed new occlusion of the branch of SVG-ramus and OM that touched down on OM.  There were also serial 70% stenoses in the mid/distal RCA.  There was not thought to be an interventional option and patient was treated medically. He was discharged home but presented a couple days later with recurrent chest pain and dyspnea.  Cath was repeated showing no change from prior.  This admission, he had FFR of the RCA which did not suggest hemodynamic significance.  Echo showed EF 55-60%, normal RV.  CTA did not show a PE. He was noted to be volume overloaded and was diuresed.  He was also noted to be in atrial fibrillation with RVR transiently.  ASA/Plavix was stopped and Eliquis was started. He was discharged to SNF.  Atrial fibrillation recurred and he was started on  amiodarone with conversion back to NSR.   In 4/20, he was admitted with left spontaneous PTX requiring chest tube.  After this, he developed recurrent left pleural effusions requiring thoracentesis x 3 so far.  Pleural fluid was exudative with eosinophil predominance.  With elevated ESR, there was concern that amiodarone could be involved, so this medication was stopped.  CT chest in 6/20 showed LLL consolidation/collapse with small left effusion, there was a rim-enhancing lesion posterior to the heart between esophagus and left atrium, ?loculated fluid.  TEE was done in 7/20 and confirmed loculated pleural effusion behind the left atrium.  In 7/20, patient had VATS on the left.   Patient returns for followup of dyspnea and CAD. Weight is up about 4 lbs, he has not been exercising much.  He has the same dyspnea that he has had chronically, maybe a bit worse recently.  He is short of breath walking about 200 feet or walking up stairs.  No exertional chest pain.  He had an episode on Sunday where he felt lightheaded and dizzy, he almost passed out.  This lasted a few minutes.  It occurred while sitting at the dinner table.  He has not had an episode like this before or after.  He has ongoing chronic low back pain.   REDS vest 27%  ECG (personally reviewed): NSR, LAFB, RBBB  Labs (2/15): K 4.2, creatinine 1.2, LDL 87, HDL 39, BNP 71 Labs (4/15): K 3.9, creatinine  1.1 Labs (12/15): LDL 73, HDL 37, K 3.9, creatinine 1.0 Labs (12/16): LDL 71, HDL 49 Labs (3/17): K 4.2, creatinine 1.28, HCT 45 Labs (8/18): LDL 82, HDL 41, TSH normal, hgb 16.3, K 4.1, creatinine 1.05 Labs (2/20): LFTs normal, pro BNP 842 Labs (3/20): K 3.8, creatinine 1.48, TSH mildly elevated at 6.97, free T3 and free T4 normal Labs (4/20): K 3.9, creatinine 1.36 Labs (2/20): LDL 52 Labs (8/20): BNP 241, creatinine 1.21  Labs (9/20): BNP 244, K 4, creatinine 1.12  PMH: 1. Essential tremor 2. CAD: s/p CABG in 3/12.   -  Cardiolite (8/18): EF 52%, no ischemia/infarction.  - NSTEMI (1/20):  LHC showed new occlusion of the branch of SVG-ramus and OM that touched down on OM.  There were also serial 70% stenoses in the mid/distal RCA.  FFR of RCA was negative.  3. Atrial fibrillation: Only noted post-op CABG.  4. PE: Post-op CABG in 2012.  5. AAA: 3.6 cm on [place] in 3/14.  3.6 cm on [place] in 3/15. 3.6 cm on [place] 8/16. 4.1 cm on [place] 4/17.  - AAA [place] (5/18): 4.1 cm AAA.  - AAA [place] (5/19): 4.1 cm AAA.  - AAA [place] (4/20): 4.1 cm AAA.  6. OSA: On CPAP.  7. Carotid stenosis: Carotid dopplers (7/51) with 02-58% LICA stenosis.  Carotid dopplers (5/27) with 78-24% LICA stenosis. Carotid dopplers (3/16) with 60-79% RCIA stenosis, 23-53% LICA stenosis.  - Carotid dopplers (8/16) with 40-59% RICA stenosis, 61-44% LICA stenosis.  - Carotid dopplers (8/17) with 31-54% RICA, 00-86% LICA. - Carotid dopplers (9/18) with 76-19% RICA, 50-93% LICA.   - Carotid dopplers (9/19) with 26-71% RICA, 24-58% LICA.  - Carotid dopplers (9/20) with 60-79% BICA 8. Asthma 9. PNA x 2 10. Chronic diastolic CHF/dyspnea: Echo (9/14) with EF 60-65%, mild LVH, very mild AS with mean gradient 10 mmHg, RV moderately dilated.  PFTs (3/14) showed mild obstructive airways disease.  V/Q scan (3/14) with no PE.  ENT workup for upper respiratory causes was negative.  RHC (2/15) with mean RA 7, PA 32/12, mean PCWP 13, CI 3.18.  CPX (3/15) with peak VO2 16.4, VE/VCO2 33; normal when compared to age-matched sedentary normals; chronotropic incompetence was noted.  Dyspnea was improved with weight loss.  - Echo (8/17): EF 60-65%, mild AS.  - Echo (1/20): EF 55-60%, normal RV size and systolic function.  - Echo (6/20): EF 55%, mild LVH, mild AS, normal RV size and systolic function, ?LA mass or mass impinging on posterior LA.  - TEE (7/20): EF 50-55%, mild LVH, basal inferolateral aneurysm, normal RV size/systolic function, loculated pleural fluid behind LA.  11. HTN 12. Aortic  stenosis: Mild.  13. Ascending aortic aneurysm: CTA chest with 4.4 cm ascending aorta in 1/20.  - 4.2 cm ascending aorta on CT chest 6/20.  14. Atrial fibrillation: Paroxysmal.  15. CKD: Stage 3.  16. Left lung spontaneous PTX then recurrent left pleural effusion.  - Left VATS in 7/20.  16. Zio patch (6/20): NSR with 1 short NSVT run and few short SVT runs, no atrial fibrillation.    Current Outpatient Medications  Medication Sig Dispense Refill  . acetaminophen (TYLENOL) 500 MG tablet Take 1,000 mg by mouth every 6 (six) hours as needed for moderate pain or headache.    Marland Kitchen apixaban (ELIQUIS) 5 MG TABS tablet Take 1 tablet (5 mg total) by mouth 2 (two) times daily. 60 tablet 5  . Ascorbic Acid 500 MG CAPS Take 500 mg  by mouth daily.    Marland Kitchen atorvastatin (LIPITOR) 80 MG tablet Take 1 tablet (80 mg total) by mouth daily. 30 tablet 6  . carvedilol (COREG) 3.125 MG tablet Take 1 tablet (3.125 mg total) by mouth 2 (two) times daily. 60 tablet 11  . cetirizine (ZYRTEC) 10 MG tablet Take 10 mg by mouth daily.      . cholecalciferol (VITAMIN D) 1000 UNITS tablet Take 1,000 Units by mouth every evening.     . furosemide (LASIX) 40 MG tablet Take 40 mg by mouth daily.    . isosorbide mononitrate (IMDUR) 30 MG 24 hr tablet Take 2 tablets (60 mg total) by mouth daily.    Marland Kitchen levothyroxine (SYNTHROID, LEVOTHROID) 50 MCG tablet Take 50 mcg by mouth daily.    . Multiple Vitamins-Minerals (CENTRUM SILVER PO) Take 1 tablet by mouth daily.     . pantoprazole (PROTONIX) 40 MG tablet Take 40 mg by mouth daily.     . tamsulosin (FLOMAX) 0.4 MG CAPS capsule Take 0.4 mg by mouth daily after supper.      No current facility-administered medications for this encounter.    Allergies:   Ancef [cefazolin sodium] and Vancomycin   Social History:  The patient  reports that he quit smoking about 35 years ago. His smoking use included cigarettes. He has a 35.00 pack-year smoking history. He has never used smokeless  tobacco. He reports current alcohol use of about 2.0 standard drinks of alcohol per week. He reports that he does not use drugs.   Family History:  The patient's family history includes Coronary artery disease in his mother; Heart disease in his mother; Hypertension in his mother.   ROS:  Please see the history of present illness.   All other systems are personally reviewed and negative.   Exam:   BP 122/82   Pulse 69   Wt 92.8 kg (204 lb 9.6 oz)   SpO2 97%   BMI 25.57 kg/m  General: NAD Neck: No JVD, no thyromegaly or thyroid nodule.  Lungs: Decrease BS left base.  CV: Nondisplaced PMI.  Heart regular S1/S2, no S3/S4, 1/6 SEM RUSB.  No peripheral edema.  No carotid bruit.  Normal pedal pulses.  Abdomen: Soft, nontender, no hepatosplenomegaly, no distention.  Skin: Intact without lesions or rashes.  Neurologic: Alert and oriented x 3.  Psych: Normal affect. Extremities: No clubbing or cyanosis.  HEENT: Normal.   Recent Labs: 04/12/2019: ALT 12 05/12/2019: Hemoglobin 15.0; Magnesium 2.1; Platelets 158; TSH 9.446 10/25/2019: B Natriuretic Peptide 270.7; BUN 27; Creatinine, Ser 1.26; Potassium 4.3; Sodium 142  Personally reviewed   Wt Readings from Last 3 Encounters:  10/25/19 92.8 kg (204 lb 9.6 oz)  07/21/19 89.8 kg (198 lb)  06/17/19 90.9 kg (200 lb 6.4 oz)      ASSESSMENT AND PLAN:  1. CAD: s/p CABG 3/12. NSTEMI 1/20, LHC showed new occlusion of the branch of SVG-ramus and OM that touched down on OM.  There were also serial 70% stenoses in the mid/distal RCA.  FFR of RCA was negative.  No interventional target.  Occasional atypical chest pain, none exertional.  - No ASA given Eliquis use.  - Continue Imdur 60 mg daily.  - Continue atorvastatin 80 daily.   - Off ranolazine with prolonged QTc.  2. Hyperlipidemia: Goal LDL < 70.   - On atorvastatin 80 daily, good lipids in 2/20.  3. Chronic diastolic CHF: TEE in 3/84 showed EF 50-55%, basal inferolateral aneurysm, and normal  RV.  He is not volume overloaded on exam or by REDS vest reading.  I think that his dyspnea is related to his left lung disease. - Continue Lasix 40 mg daily.  BMET/BNP today.   4. Spontaneous left PTX with recurrent left-sided pleural effusion:  He is s/p left VATS/pleurodesis in 7/20.    - Repeat CXR PA/lateral to follow size of pleural effusion.  5. Carotid stenosis: Repeat carotid dopplers in 9/21, moderate stenosis on prior studies.   6. AAA: Stable 4.1 cm AAA, repeat US in 4/21.    7. Aortic stenosis: Mild.   8. Atrial fibrillation: Paroxysmal. He is significantly symptomatic while in atrial fibrillation.  He is in NSR today.  He is off amiodarone due to concern for pulmonary toxicity.   - Continue Eliquis 5 mg bid. Denies bleeding.  9. Presyncopal spell: Occurred last Sunday while seated.  Pulse-ox measured during event, he says that he does not think pulse was abnormal.  Possibly vasovagal.  - I will have him wear a Zio patch monitor x 2 wks.  - Check orthostatics.   Recommended follow-up:  4 months.   Signed, [name], MD  10/25/2019  [facility] [address] [facility] [place] [address] [phone] (office) [phone] (fax)

## 2019-10-25 NOTE — Progress Notes (Addendum)
Zio patch placed onto patient.  All instructions and information reviewed with patient, they verbalize understanding with no questions.   ReDS Vest / Clip - 10/25/19 0900      ReDS Vest / Clip   Station Marker  D    Ruler Value  36    ReDS Value Range  Low volume    ReDS Actual Value  27

## 2019-10-25 NOTE — Patient Instructions (Addendum)
Continue to take Lasix 40mg  (1 tab) daily   Labs today We will only contact you if something comes back abnormal or we need to make some changes. Otherwise no news is good news!   You will have a chest xray today.  Our office staff will call you with the results once this is complete   You have been ordered for an abdominal ultrasound. You will get a call to schedule this appointment in April 2021.   Your provider has recommended that  you wear a Zio Patch for 14 days.  This monitor will record your heart rhythm for our review.  IF you have any symptoms while wearing the monitor please press the button.  If you have any issues with the patch or you notice a red or orange light on it please call the company at [phone].  Once you remove the patch please mail it back to the company as soon as possible so we can get the results.   Your physician recommends that you schedule a follow-up appointment in: 4 months with Dr Aundra Dubin.   Please call office at [phone] option 2 if you have any questions or concerns.    At the [facility], you and your health needs are our priority. As part of our continuing mission to provide you with exceptional heart care, we have created designated Provider Care Teams. These Care Teams include your primary Cardiologist (physician) and Advanced Practice Providers (APPs- Physician Assistants and Nurse Practitioners) who all work together to provide you with the care you need, when you need it.   You may see any of the following providers on your designated Care Team at your next follow up: Marland Kitchen Dr Glori Bickers . Dr [name] . Darrick Grinder, NP . Lyda Jester, PA . Audry Riles, PharmD   Please be sure to bring in all your medications bottles to every appointment.

## 2019-11-02 NOTE — Telephone Encounter (Signed)
error 

## 2019-11-16 DIAGNOSIS — Z87898 Personal history of other specified conditions: Secondary | ICD-10-CM | POA: Diagnosis not present

## 2019-11-29 NOTE — Addendum Note (Signed)
Encounter addended by: Micki Riley, RN on: 11/29/2019 2:52 PM  Actions taken: Imaging Exam ended

## 2020-01-05 ENCOUNTER — Telehealth (HOSPITAL_COMMUNITY): Payer: Self-pay

## 2020-01-05 NOTE — Telephone Encounter (Signed)
Received request for cardiac clearance for patient from [facility]. Per advice, patient needs to hold eliquis 3 days before procedure. Called patient and advised him of same. Clearance faxed to [email or web address], confirmation received. Original scanned into chart.

## 2020-01-12 DIAGNOSIS — M5136 Other intervertebral disc degeneration, lumbar region: Secondary | ICD-10-CM | POA: Diagnosis not present

## 2020-01-24 DIAGNOSIS — Z125 Encounter for screening for malignant neoplasm of prostate: Secondary | ICD-10-CM | POA: Diagnosis not present

## 2020-01-24 DIAGNOSIS — E7849 Other hyperlipidemia: Secondary | ICD-10-CM | POA: Diagnosis not present

## 2020-01-24 DIAGNOSIS — E038 Other specified hypothyroidism: Secondary | ICD-10-CM | POA: Diagnosis not present

## 2020-01-24 DIAGNOSIS — E1122 Type 2 diabetes mellitus with diabetic chronic kidney disease: Secondary | ICD-10-CM | POA: Diagnosis not present

## 2020-01-31 DIAGNOSIS — I5189 Other ill-defined heart diseases: Secondary | ICD-10-CM | POA: Diagnosis not present

## 2020-01-31 DIAGNOSIS — R0789 Other chest pain: Secondary | ICD-10-CM | POA: Diagnosis not present

## 2020-01-31 DIAGNOSIS — I350 Nonrheumatic aortic (valve) stenosis: Secondary | ICD-10-CM | POA: Diagnosis not present

## 2020-01-31 DIAGNOSIS — R972 Elevated prostate specific antigen [PSA]: Secondary | ICD-10-CM | POA: Diagnosis not present

## 2020-01-31 DIAGNOSIS — Z1331 Encounter for screening for depression: Secondary | ICD-10-CM | POA: Diagnosis not present

## 2020-01-31 DIAGNOSIS — E669 Obesity, unspecified: Secondary | ICD-10-CM | POA: Diagnosis not present

## 2020-01-31 DIAGNOSIS — I2510 Atherosclerotic heart disease of native coronary artery without angina pectoris: Secondary | ICD-10-CM | POA: Diagnosis not present

## 2020-01-31 DIAGNOSIS — R82998 Other abnormal findings in urine: Secondary | ICD-10-CM | POA: Diagnosis not present

## 2020-01-31 DIAGNOSIS — Z0000 Encounter for general adult medical examination without abnormal findings: Secondary | ICD-10-CM | POA: Diagnosis not present

## 2020-01-31 DIAGNOSIS — E1122 Type 2 diabetes mellitus with diabetic chronic kidney disease: Secondary | ICD-10-CM | POA: Diagnosis not present

## 2020-01-31 DIAGNOSIS — I119 Hypertensive heart disease without heart failure: Secondary | ICD-10-CM | POA: Diagnosis not present

## 2020-01-31 DIAGNOSIS — I5022 Chronic systolic (congestive) heart failure: Secondary | ICD-10-CM | POA: Diagnosis not present

## 2020-01-31 DIAGNOSIS — Z1339 Encounter for screening examination for other mental health and behavioral disorders: Secondary | ICD-10-CM | POA: Diagnosis not present

## 2020-02-02 DIAGNOSIS — Z1212 Encounter for screening for malignant neoplasm of rectum: Secondary | ICD-10-CM | POA: Diagnosis not present

## 2020-02-21 ENCOUNTER — Ambulatory Visit (HOSPITAL_COMMUNITY)
Admission: RE | Admit: 2020-02-21 | Discharge: 2020-02-21 | Disposition: A | Discharge status: Home / Self Care | Payer: Medicare Other | Source: Ambulatory Visit | Attending: Cardiology | Admitting: Cardiology

## 2020-02-21 ENCOUNTER — Other Ambulatory Visit: Payer: Self-pay

## 2020-02-21 ENCOUNTER — Encounter (HOSPITAL_COMMUNITY): Payer: Self-pay | Admitting: Cardiology

## 2020-02-21 VITALS — BP 116/66 | HR 66 | Wt 204.6 lb

## 2020-02-21 DIAGNOSIS — I452 Bifascicular block: Secondary | ICD-10-CM | POA: Insufficient documentation

## 2020-02-21 DIAGNOSIS — Z951 Presence of aortocoronary bypass graft: Secondary | ICD-10-CM | POA: Diagnosis not present

## 2020-02-21 DIAGNOSIS — Z881 Allergy status to other antibiotic agents status: Secondary | ICD-10-CM | POA: Insufficient documentation

## 2020-02-21 DIAGNOSIS — G4733 Obstructive sleep apnea (adult) (pediatric): Secondary | ICD-10-CM | POA: Insufficient documentation

## 2020-02-21 DIAGNOSIS — I350 Nonrheumatic aortic (valve) stenosis: Secondary | ICD-10-CM | POA: Diagnosis not present

## 2020-02-21 DIAGNOSIS — Z87891 Personal history of nicotine dependence: Secondary | ICD-10-CM | POA: Diagnosis not present

## 2020-02-21 DIAGNOSIS — I252 Old myocardial infarction: Secondary | ICD-10-CM | POA: Insufficient documentation

## 2020-02-21 DIAGNOSIS — Z79890 Hormone replacement therapy (postmenopausal): Secondary | ICD-10-CM | POA: Diagnosis not present

## 2020-02-21 DIAGNOSIS — Z8249 Family history of ischemic heart disease and other diseases of the circulatory system: Secondary | ICD-10-CM | POA: Diagnosis not present

## 2020-02-21 DIAGNOSIS — I2510 Atherosclerotic heart disease of native coronary artery without angina pectoris: Secondary | ICD-10-CM | POA: Insufficient documentation

## 2020-02-21 DIAGNOSIS — Z79899 Other long term (current) drug therapy: Secondary | ICD-10-CM | POA: Insufficient documentation

## 2020-02-21 DIAGNOSIS — E785 Hyperlipidemia, unspecified: Secondary | ICD-10-CM | POA: Diagnosis not present

## 2020-02-21 DIAGNOSIS — I6529 Occlusion and stenosis of unspecified carotid artery: Secondary | ICD-10-CM

## 2020-02-21 DIAGNOSIS — I110 Hypertensive heart disease with heart failure: Secondary | ICD-10-CM | POA: Insufficient documentation

## 2020-02-21 DIAGNOSIS — Z7901 Long term (current) use of anticoagulants: Secondary | ICD-10-CM | POA: Diagnosis not present

## 2020-02-21 DIAGNOSIS — I480 Paroxysmal atrial fibrillation: Secondary | ICD-10-CM | POA: Diagnosis not present

## 2020-02-21 DIAGNOSIS — I5032 Chronic diastolic (congestive) heart failure: Secondary | ICD-10-CM | POA: Diagnosis not present

## 2020-02-21 DIAGNOSIS — I6523 Occlusion and stenosis of bilateral carotid arteries: Secondary | ICD-10-CM | POA: Insufficient documentation

## 2020-02-21 DIAGNOSIS — I714 Abdominal aortic aneurysm, without rupture, unspecified: Secondary | ICD-10-CM

## 2020-02-21 DIAGNOSIS — G250 Essential tremor: Secondary | ICD-10-CM | POA: Insufficient documentation

## 2020-02-21 LAB — BASIC METABOLIC PANEL
Anion gap: 8 (ref 5–15)
BUN: 23 mg/dL (ref 8–23)
CO2: 29 mmol/L (ref 22–32)
Calcium: 9.3 mg/dL (ref 8.9–10.3)
Chloride: 104 mmol/L (ref 98–111)
Creatinine, Ser: 1.11 mg/dL (ref 0.61–1.24)
GFR calc Af Amer: >60 mL/min (ref >60)
GFR calc non Af Amer: 60 mL/min — ABNORMAL LOW (ref >60)
Glucose, Bld: 97 mg/dL (ref 70–99)
Potassium: 4.2 mmol/L (ref 3.5–5.1)
Sodium: 141 mmol/L (ref 135–145)

## 2020-02-21 LAB — CBC
HCT: 43.4 % (ref 39.0–52.0)
Hemoglobin: 13.7 g/dL (ref 13.0–17.0)
MCH: 31.9 pg (ref 26.0–34.0)
MCHC: 31.6 g/dL (ref 30.0–36.0)
MCV: 101.2 fL — ABNORMAL HIGH (ref 80.0–100.0)
Platelets: 155 10*3/uL (ref 150–400)
RBC: 4.29 MIL/uL (ref 4.22–5.81)
RDW: 13.9 % (ref 11.5–15.5)
WBC: 6.5 10*3/uL (ref 4.0–10.5)
nRBC: 0 % (ref 0.0–0.2)

## 2020-02-21 LAB — LIPID PANEL
Cholesterol: 106 mg/dL (ref 0–200)
HDL: 40 mg/dL — ABNORMAL LOW (ref >40)
LDL Cholesterol: 57 mg/dL (ref 0–99)
Total CHOL/HDL Ratio: 2.7 RATIO
Triglycerides: 45 mg/dL (ref <150)
VLDL: 9 mg/dL (ref 0–40)

## 2020-02-21 NOTE — Patient Instructions (Signed)
Labs today We will only contact you if something comes back abnormal or we need to make some changes. Otherwise no news is good news!  You have been ordered for an abdominal ultrasound.  You will get a call to schedule this appointment  You have been ordered for an ultrasound of your carotid arteries. You will get a call to schedule an appointment for September 2021.  Your physician wants you to follow-up in: 6 months. You will receive a reminder letter in the mail two months in advance. If you don't receive a letter, please call our office to schedule the follow-up appointment.  Please call office at [phone] option 2 if you have any questions or concerns.   At the [facility], you and your health needs are our priority. As part of our continuing mission to provide you with exceptional heart care, we have created designated Provider Care Teams. These Care Teams include your primary Cardiologist (physician) and Advanced Practice Providers (APPs- Physician Assistants and Nurse Practitioners) who all work together to provide you with the care you need, when you need it.   You may see any of the following providers on your designated Care Team at your next follow up: Marland Kitchen Dr Glori Bickers . Dr [name] . Darrick Grinder, NP . Lyda Jester, PA . Audry Riles, PharmD   Please be sure to bring in all your medications bottles to every appointment.

## 2020-02-22 NOTE — Progress Notes (Signed)
HDate:  02/22/2020   ID:  Eric Gordon, DOB Feb 20, 1931, MRN [id number]   Provider location:  Advanced Heart Failure Type of Visit: Established patient  PCP:  Shon Baton, MD  Cardiologist:  Dr. Aundra Dubin  Chief Complaint: Shortness of breath   History of Present Illness: Eric Gordon is a 86 y.o. male with history of CAD s/p CABG.  He has had trouble long-term with exertional dyspnea.  Dyspnea triggered evaluation in 2012 leading to CABG but was not resolved by CABG.  PFTs in 3/14 showed only mild obstructive airways disease and V/Q scan showed no PE.  Echo in 9/14 showed normal LV systolic function with moderately dilated RV. [place] in 2/15 showed normal right and left heart filling pressures and normal PA pressure.  Finally, he had a CPX in 3/15 that showed normal capacity compared to age-matched sedentary norms.  He was noted to have chronotropic incompetence.  At a prior appointment, I took him off metoprolol given chronotropic incompetence noted on CPX.  Dyspnea improved significantly with weight loss.  He had Cardiolite in 8/18 with EF 52%, no ischemia or infarction.   He was admitted in 1/20 with NSTEMI.  LHC showed new occlusion of the branch of SVG-ramus and OM that touched down on OM.  There were also serial 70% stenoses in the mid/distal RCA.  There was not thought to be an interventional option and patient was treated medically. He was discharged home but presented a couple days later with recurrent chest pain and dyspnea.  Cath was repeated showing no change from prior.  This admission, he had FFR of the RCA which did not suggest hemodynamic significance.  Echo showed EF 55-60%, normal RV.  CTA did not show a PE. He was noted to be volume overloaded and was diuresed.  He was also noted to be in atrial fibrillation with RVR transiently.  ASA/Plavix was stopped and Eliquis was started. He was discharged to SNF.  Atrial fibrillation recurred and he was started on  amiodarone with conversion back to NSR.   In 4/20, he was admitted with left spontaneous PTX requiring chest tube.  After this, he developed recurrent left pleural effusions requiring thoracentesis x 3 so far.  Pleural fluid was exudative with eosinophil predominance.  With elevated ESR, there was concern that amiodarone could be involved, so this medication was stopped.  CT chest in 6/20 showed LLL consolidation/collapse with small left effusion, there was a rim-enhancing lesion posterior to the heart between esophagus and left atrium, ?loculated fluid.  TEE was done in 7/20 and confirmed loculated pleural effusion behind the left atrium.  In 7/20, patient had VATS on the left.   Patient returns for followup of dyspnea and CAD. He has been doing well since last appointment.  Weight is stable.  Rare atypical left chest pain at night. No exertional chest pain.  No palpitations.  Dyspnea after walking 150-200 yards.  This is stable.  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  No orthopnea/PND.  No BRBPR/melena.    ECG (personally reviewed): NSR, 1st degree AVB, LAFB, RBBB  Labs (2/15): K 4.2, creatinine 1.2, LDL 87, HDL 39, BNP 71 Labs (4/15): K 3.9, creatinine 1.1 Labs (12/15): LDL 73, HDL 37, K 3.9, creatinine 1.0 Labs (12/16): LDL 71, HDL 49 Labs (3/17): K 4.2, creatinine 1.28, HCT 45 Labs (8/18): LDL 82, HDL 41, TSH normal, hgb 16.3, K 4.1,  creatinine 1.05 Labs (2/20): LFTs normal, pro BNP 842 Labs (3/20): K 3.8, creatinine 1.48, TSH mildly elevated at 6.97, free T3 and free T4 normal Labs (4/20): K 3.9, creatinine 1.36 Labs (2/20): LDL 52 Labs (8/20): BNP 241, creatinine 1.21  Labs (9/20): BNP 244, K 4, creatinine 1.12 Labs (2/21): K 4.3, creatinine 1.26, BNP 271  PMH: 1. Essential tremor 2. CAD: s/p CABG in 3/12.   - Cardiolite (8/18): EF 52%, no ischemia/infarction.  - NSTEMI (1/20):  LHC showed new occlusion of the branch of SVG-ramus  and OM that touched down on OM.  There were also serial 70% stenoses in the mid/distal RCA.  FFR of RCA was negative.  3. Atrial fibrillation: Only noted post-op CABG.  4. PE: Post-op CABG in 2012.  5. AAA: 3.6 cm on [place] in 3/14.  3.6 cm on [place] in 3/15. 3.6 cm on [place] 8/16. 4.1 cm on [place] 4/17.  - AAA [place] (5/18): 4.1 cm AAA.  - AAA [place] (5/19): 4.1 cm AAA.  - AAA [place] (4/20): 4.1 cm AAA.  6. OSA: On CPAP.  7. Carotid stenosis: Carotid dopplers (2/77) with 41-28% LICA stenosis.  Carotid dopplers (7/86) with 76-72% LICA stenosis. Carotid dopplers (3/16) with 60-79% RCIA stenosis, 09-47% LICA stenosis.  - Carotid dopplers (8/16) with 40-59% RICA stenosis, 09-62% LICA stenosis.  - Carotid dopplers (8/17) with 83-66% RICA, 29-47% LICA. - Carotid dopplers (9/18) with 65-46% RICA, 50-35% LICA.   - Carotid dopplers (9/19) with 46-56% RICA, 81-27% LICA.  - Carotid dopplers (9/20) with 60-79% BICA 8. Asthma 9. PNA x 2 10. Chronic diastolic CHF/dyspnea: Echo (9/14) with EF 60-65%, mild LVH, very mild AS with mean gradient 10 mmHg, RV moderately dilated.  PFTs (3/14) showed mild obstructive airways disease.  V/Q scan (3/14) with no PE.  ENT workup for upper respiratory causes was negative.  RHC (2/15) with mean RA 7, PA 32/12, mean PCWP 13, CI 3.18.  CPX (3/15) with peak VO2 16.4, VE/VCO2 33; normal when compared to age-matched sedentary normals; chronotropic incompetence was noted.  Dyspnea was improved with weight loss.  - Echo (8/17): EF 60-65%, mild AS.  - Echo (1/20): EF 55-60%, normal RV size and systolic function.  - Echo (6/20): EF 55%, mild LVH, mild AS, normal RV size and systolic function, ?LA mass or mass impinging on posterior LA.  - TEE (7/20): EF 50-55%, mild LVH, basal inferolateral aneurysm, normal RV size/systolic function, loculated pleural fluid behind LA.  11. HTN 12. Aortic stenosis: Mild.  13. Ascending aortic aneurysm: CTA chest with 4.4 cm ascending aorta in 1/20.  - 4.2 cm ascending aorta  on CT chest 6/20.  14. Atrial fibrillation: Paroxysmal.  15. CKD: Stage 3.  16. Left lung spontaneous PTX then recurrent left pleural effusion.  - Left VATS in 7/20.  17. Palpitations:  - Zio patch (6/20): NSR with 1 short NSVT run and few short SVT runs, no atrial fibrillation.  - Zio patch 2 wks (2/21): 6 short NSVT runs, 66 short SVT runs, rare PACs/PVCs.    Current Outpatient Medications  Medication Sig Dispense Refill  . acetaminophen (TYLENOL) 500 MG tablet Take 1,000 mg by mouth every 6 (six) hours as needed for moderate pain or headache.    Marland Kitchen apixaban (ELIQUIS) 5 MG TABS tablet Take 1 tablet (5 mg total) by mouth 2 (two) times daily. 60 tablet 5  . Ascorbic Acid 500 MG CAPS Take 500 mg by mouth daily.    Marland Kitchen atorvastatin (LIPITOR)  80 MG tablet Take 1 tablet (80 mg total) by mouth daily. 30 tablet 6  . carvedilol (COREG) 3.125 MG tablet Take 1 tablet (3.125 mg total) by mouth 2 (two) times daily. 60 tablet 11  . cetirizine (ZYRTEC) 10 MG tablet Take 10 mg by mouth daily.      . cholecalciferol (VITAMIN D) 1000 UNITS tablet Take 1,000 Units by mouth every evening.     . furosemide (LASIX) 40 MG tablet Take 40 mg by mouth daily.    . isosorbide mononitrate (IMDUR) 30 MG 24 hr tablet Take 2 tablets (60 mg total) by mouth daily.    Marland Kitchen levothyroxine (SYNTHROID, LEVOTHROID) 50 MCG tablet Take 50 mcg by mouth daily.    . Multiple Vitamins-Minerals (CENTRUM SILVER PO) Take 1 tablet by mouth daily.     . pantoprazole (PROTONIX) 40 MG tablet Take 40 mg by mouth daily.     . tamsulosin (FLOMAX) 0.4 MG CAPS capsule Take 0.4 mg by mouth daily after supper.      No current facility-administered medications for this encounter.    Allergies:   Ancef [cefazolin sodium] and Vancomycin   Social History:  The patient  reports that he quit smoking about 36 years ago. His smoking use included cigarettes. He has a 35.00 pack-year smoking history. He has never used smokeless tobacco. He reports current  alcohol use of about 2.0 standard drinks of alcohol per week. He reports that he does not use drugs.   Family History:  The patient's family history includes Coronary artery disease in his mother; Heart disease in his mother; Hypertension in his mother.   ROS:  Please see the history of present illness.   All other systems are personally reviewed and negative.   Exam:   BP 116/66   Pulse 66   Wt 92.8 kg (204 lb 9.6 oz)   SpO2 97%   BMI 25.57 kg/m  General: NAD Neck: No JVD, no thyromegaly or thyroid nodule.  Lungs: Clear to auscultation bilaterally with normal respiratory effort. CV: Nondisplaced PMI.  Heart regular S1/S2, no S3/S4, 2/6 SEM RUSB.  No peripheral edema.  No carotid bruit.  Normal pedal pulses.  Abdomen: Soft, nontender, no hepatosplenomegaly, no distention.  Skin: Intact without lesions or rashes.  Neurologic: Alert and oriented x 3.  Psych: Normal affect. Extremities: No clubbing or cyanosis.  HEENT: Normal.   Recent Labs: 04/12/2019: ALT 12 05/12/2019: Magnesium 2.1; TSH 9.446 10/25/2019: B Natriuretic Peptide 270.7 02/21/2020: BUN 23; Creatinine, Ser 1.11; Hemoglobin 13.7; Platelets 155; Potassium 4.2; Sodium 141  Personally reviewed   Wt Readings from Last 3 Encounters:  02/21/20 92.8 kg (204 lb 9.6 oz)  10/25/19 92.8 kg (204 lb 9.6 oz)  07/21/19 89.8 kg (198 lb)      ASSESSMENT AND PLAN:  1. CAD: s/p CABG 3/12. NSTEMI 1/20, LHC showed new occlusion of the branch of SVG-ramus and OM that touched down on OM.  There were also serial 70% stenoses in the mid/distal RCA.  FFR of RCA was negative.  No interventional target.  Occasional atypical chest pain, none exertional.  - No ASA given Eliquis use.  - Continue Imdur 60 mg daily.  - Continue atorvastatin 80 daily.   - Off ranolazine with prolonged QTc.  2. Hyperlipidemia: Goal LDL < 70.   - On atorvastatin 80 daily, check lipids today.  3. Chronic diastolic CHF: TEE in 6/56 showed EF 50-55%, basal inferolateral  aneurysm, and normal RV.  He is not volume overloaded  on exam.  I think that his dyspnea is related to his left lung disease (mild and stable). - Continue Lasix 40 mg daily.  BMET today.   4. Spontaneous left PTX with recurrent left-sided pleural effusion:  He is s/p left VATS/pleurodesis in 7/20.    5. Carotid stenosis: Repeat carotid dopplers in 9/21, moderate stenosis on prior studies.   6. AAA: Due for repeat abd [place], will arrange.     7. Aortic stenosis: Mild.   8. Atrial fibrillation: Paroxysmal. He is significantly symptomatic while in atrial fibrillation.  He is in NSR today.  He is off amiodarone due to concern for pulmonary toxicity.   - Continue Eliquis 5 mg bid. Denies bleeding.   Recommended follow-up:  6 months.   Signed, [name], MD  02/22/2020  [facility] [address] [facility] [place] [address] [phone] (office) [phone] (fax)

## 2020-04-03 DIAGNOSIS — M25562 Pain in left knee: Secondary | ICD-10-CM | POA: Insufficient documentation

## 2020-04-05 DIAGNOSIS — M1712 Unilateral primary osteoarthritis, left knee: Secondary | ICD-10-CM | POA: Insufficient documentation

## 2020-04-05 DIAGNOSIS — M25562 Pain in left knee: Secondary | ICD-10-CM | POA: Diagnosis not present

## 2020-04-12 DIAGNOSIS — M5136 Other intervertebral disc degeneration, lumbar region: Secondary | ICD-10-CM | POA: Diagnosis not present

## 2020-04-19 DIAGNOSIS — M25562 Pain in left knee: Secondary | ICD-10-CM | POA: Diagnosis not present

## 2020-04-20 ENCOUNTER — Telehealth (HOSPITAL_COMMUNITY): Payer: Self-pay | Admitting: Cardiology

## 2020-04-20 NOTE — Telephone Encounter (Signed)
Medical clearance completed by Dr Aundra Dubin Patient is approved to proceed with Lumbar ESI with Dr Nelva Bush    from a cardiac standpoint. Medication prep- hold eliquis 3 days prior   Faxed to [facility] Fax #  [phone]

## 2020-05-04 DIAGNOSIS — S83242D Other tear of medial meniscus, current injury, left knee, subsequent encounter: Secondary | ICD-10-CM | POA: Diagnosis not present

## 2020-05-04 DIAGNOSIS — M25562 Pain in left knee: Secondary | ICD-10-CM | POA: Diagnosis not present

## 2020-05-04 DIAGNOSIS — S83272D Complex tear of lateral meniscus, current injury, left knee, subsequent encounter: Secondary | ICD-10-CM | POA: Diagnosis not present

## 2020-05-04 DIAGNOSIS — M1712 Unilateral primary osteoarthritis, left knee: Secondary | ICD-10-CM | POA: Diagnosis not present

## 2020-05-04 DIAGNOSIS — S83289A Other tear of lateral meniscus, current injury, unspecified knee, initial encounter: Secondary | ICD-10-CM | POA: Insufficient documentation

## 2020-06-08 ENCOUNTER — Telehealth (HOSPITAL_COMMUNITY): Payer: Self-pay

## 2020-06-08 ENCOUNTER — Other Ambulatory Visit: Payer: Self-pay

## 2020-06-08 ENCOUNTER — Ambulatory Visit (HOSPITAL_BASED_OUTPATIENT_CLINIC_OR_DEPARTMENT_OTHER)
Admission: RE | Admit: 2020-06-08 | Discharge: 2020-06-08 | Disposition: A | Discharge status: Home / Self Care | Payer: Medicare Other | Source: Ambulatory Visit | Attending: Cardiology | Admitting: Cardiology

## 2020-06-08 ENCOUNTER — Ambulatory Visit (HOSPITAL_COMMUNITY)
Admission: RE | Admit: 2020-06-08 | Discharge: 2020-06-08 | Disposition: A | Discharge status: Home / Self Care | Payer: Medicare Other | Source: Ambulatory Visit | Attending: Cardiovascular Disease | Admitting: Cardiovascular Disease

## 2020-06-08 ENCOUNTER — Other Ambulatory Visit (HOSPITAL_COMMUNITY): Payer: Self-pay | Admitting: Cardiology

## 2020-06-08 DIAGNOSIS — I6529 Occlusion and stenosis of unspecified carotid artery: Secondary | ICD-10-CM

## 2020-06-08 DIAGNOSIS — I714 Abdominal aortic aneurysm, without rupture, unspecified: Secondary | ICD-10-CM

## 2020-06-08 DIAGNOSIS — I6523 Occlusion and stenosis of bilateral carotid arteries: Secondary | ICD-10-CM

## 2020-06-08 NOTE — Telephone Encounter (Signed)
-----   Message from Larey Dresser, MD sent at 06/08/2020 10:21 AM EDT ----- 4.5 cm AAA, mildly larger.  Need repeat study in 1 year.

## 2020-06-08 NOTE — Telephone Encounter (Signed)
Patient advised and verbalized understanding. Patient ok with referral. Referral placed.   Orders Placed This Encounter  Procedures   Ambulatory referral to Vascular Surgery    Referral Priority:   Routine    Referral Type:   Surgical    Referral Reason:   Specialty Services Required    Referred to Provider:   Serafina Mitchell, MD    Requested Specialty:   Vascular Surgery    Number of Visits Requested:   1

## 2020-06-08 NOTE — Telephone Encounter (Signed)
Patient advised and verbalized understanding. Order entered for 1 year from date.

## 2020-06-08 NOTE — Telephone Encounter (Signed)
-----   Message from Larey Dresser, MD sent at 06/08/2020 10:20 AM EDT ----- Right carotid stenosis now looks severe.  This increases risk for CVA and should be addressed.  Would refer for evaluation to vascular surgery, Dr. Trula Slade.

## 2020-06-14 DIAGNOSIS — M25562 Pain in left knee: Secondary | ICD-10-CM | POA: Diagnosis not present

## 2020-06-14 DIAGNOSIS — M1712 Unilateral primary osteoarthritis, left knee: Secondary | ICD-10-CM | POA: Diagnosis not present

## 2020-06-30 DIAGNOSIS — Z23 Encounter for immunization: Secondary | ICD-10-CM | POA: Diagnosis not present

## 2020-07-27 ENCOUNTER — Other Ambulatory Visit: Payer: Self-pay

## 2020-07-27 DIAGNOSIS — I6529 Occlusion and stenosis of unspecified carotid artery: Secondary | ICD-10-CM

## 2020-08-02 DIAGNOSIS — S83272D Complex tear of lateral meniscus, current injury, left knee, subsequent encounter: Secondary | ICD-10-CM | POA: Diagnosis not present

## 2020-08-02 DIAGNOSIS — M1712 Unilateral primary osteoarthritis, left knee: Secondary | ICD-10-CM | POA: Diagnosis not present

## 2020-08-02 DIAGNOSIS — S83242D Other tear of medial meniscus, current injury, left knee, subsequent encounter: Secondary | ICD-10-CM | POA: Diagnosis not present

## 2020-08-06 ENCOUNTER — Other Ambulatory Visit: Payer: Self-pay

## 2020-08-06 ENCOUNTER — Ambulatory Visit (INDEPENDENT_AMBULATORY_CARE_PROVIDER_SITE_OTHER): Payer: Medicare Other | Admitting: Surgery

## 2020-08-06 ENCOUNTER — Encounter (HOSPITAL_COMMUNITY): Payer: Self-pay

## 2020-08-06 ENCOUNTER — Ambulatory Visit (HOSPITAL_COMMUNITY)
Admission: RE | Admit: 2020-08-06 | Discharge: 2020-08-06 | Disposition: A | Discharge status: Home / Self Care | Payer: Medicare Other | Source: Ambulatory Visit | Attending: Surgery | Admitting: Surgery

## 2020-08-06 ENCOUNTER — Encounter: Payer: Self-pay | Admitting: Surgery

## 2020-08-06 VITALS — BP 157/89 | HR 61 | Temp 97.6°F | Resp 20 | Ht 75.0 in | Wt 204.0 lb

## 2020-08-06 DIAGNOSIS — I6523 Occlusion and stenosis of bilateral carotid arteries: Secondary | ICD-10-CM | POA: Diagnosis not present

## 2020-08-06 DIAGNOSIS — I6529 Occlusion and stenosis of unspecified carotid artery: Secondary | ICD-10-CM

## 2020-08-06 NOTE — Progress Notes (Signed)
[facility]  Patient name: Eric Gordon MRN: [id number] DOB: 1931-04-18 Sex: male   REQUESTING PROVIDER:    Dr. Aundra Dubin   REASON FOR CONSULT:    Carotid stenosis  HISTORY OF PRESENT ILLNESS:   Eric Gordon is a 86 y.o. male, who is referred for bilateral carotid stenosis.  He is asymptomatic.  Specifically, he denies numbness or weakness in either extremity.  He denies slurred speech.  He denies amaurosis fugax.  Patient has a history of coronary artery disease status post CABG.  He has had a NSTEMI from occluded bypass grafts.  He has atrial fibrillation.  He is anticoagulated on Eliquis.  He has had a spontaneous pneumothorax requiring chest tube decompression he gets dyspneic at 150 to 200 yards.  He is medically managed for hypertension.  He takes a statin for hypercholesterolemia.  He has a known abdominal aortic aneurysm with ultrasound on 06/08/2020 showing maximal diameter 4.1 cm.  PAST MEDICAL HISTORY    Past Medical History:  Diagnosis Date  . Abdominal aortic aneurysm (AAA) ([place])   . Arthritis    "my whole body" (03/19/2018)  . Atrial fibrillation ([place])   . CAD (coronary artery disease)   . CHF (congestive heart failure) ([place])   . Chronic anticoagulation    PE  . Chronic lower back pain   . GERD (gastroesophageal reflux disease)   . High cholesterol   . Hypertension   . Hypothyroidism   . Myocardial infarction [facility])    2 in Jan. 2019  . OSA on CPAP    uses CPAP  . Pneumonia    "now and once before" (03/19/2018)  . Pulmonary embolism [facility])    April 2012 after CABG  . S/P CABG (coronary artery bypass graft)      FAMILY HISTORY   Family History  Problem Relation Age of Onset  . Coronary artery disease Mother   . Hypertension Mother   . Heart disease Mother     SOCIAL HISTORY:   Social History   Socioeconomic History  . Marital status: Married    Spouse name: Not on  file  . Number of children: Not on file  . Years of education: Not on file  . Highest education level: Not on file  Occupational History  . Occupation: Scientist, clinical (histocompatibility and immunogenetics): [employer]  Tobacco Use  . Smoking status: Former Smoker    Packs/day: 1.00    Years: 35.00    Pack years: 35.00    Types: Cigarettes    Quit date: 02/18/1984    Years since quitting: 36.4  . Smokeless tobacco: Never Used  Vaping Use  . Vaping Use: Never used  Substance and Sexual Activity  . Alcohol use: Yes    Alcohol/week: 2.0 standard drinks    Types: 2 Standard drinks or equivalent per week  . Drug use: Never  . Sexual activity: Not on file  Other Topics Concern  . Not on file  Social History Narrative  . Not on file   Social Determinants of Health   Financial Resource Strain:   . Difficulty of Paying Living Expenses: Not on file  Food Insecurity:   . Worried About Charity fundraiser in the Last Year: Not on file  . Ran Out of Food in the Last Year: Not on file  Transportation Needs:   . Lack of Transportation (Medical): Not on file  . Lack of Transportation (Non-Medical): Not on file  Physical  Activity:   . Days of Exercise per Week: Not on file  . Minutes of Exercise per Session: Not on file  Stress:   . Feeling of Stress : Not on file  Social Connections:   . Frequency of Communication with Friends and Family: Not on file  . Frequency of Social Gatherings with Friends and Family: Not on file  . Attends Religious Services: Not on file  . Active Member of Clubs or Organizations: Not on file  . Attends Archivist Meetings: Not on file  . Marital Status: Not on file  Intimate Partner Violence:   . Fear of Current or Ex-Partner: Not on file  . Emotionally Abused: Not on file  . Physically Abused: Not on file  . Sexually Abused: Not on file    ALLERGIES:    Allergies  Allergen Reactions  . Ancef [Cefazolin Sodium] Hives  . Vancomycin Rash    CURRENT MEDICATIONS:     Current Outpatient Medications  Medication Sig Dispense Refill  . acetaminophen (TYLENOL) 500 MG tablet Take 1,000 mg by mouth every 6 (six) hours as needed for moderate pain or headache.    Marland Kitchen apixaban (ELIQUIS) 5 MG TABS tablet Take 1 tablet (5 mg total) by mouth 2 (two) times daily. 60 tablet 5  . Ascorbic Acid 500 MG CAPS Take 500 mg by mouth daily.    Marland Kitchen atorvastatin (LIPITOR) 80 MG tablet Take 1 tablet (80 mg total) by mouth daily. 30 tablet 6  . cetirizine (ZYRTEC) 10 MG tablet Take 10 mg by mouth daily.      . cholecalciferol (VITAMIN D) 1000 UNITS tablet Take 1,000 Units by mouth every evening.     . furosemide (LASIX) 40 MG tablet Take 40 mg by mouth daily.    . isosorbide mononitrate (IMDUR) 30 MG 24 hr tablet Take 2 tablets (60 mg total) by mouth daily.    Marland Kitchen levothyroxine (SYNTHROID, LEVOTHROID) 50 MCG tablet Take 50 mcg by mouth daily.    . Multiple Vitamins-Minerals (CENTRUM SILVER PO) Take 1 tablet by mouth daily.     . pantoprazole (PROTONIX) 40 MG tablet Take 40 mg by mouth daily.     . tamsulosin (FLOMAX) 0.4 MG CAPS capsule Take 0.4 mg by mouth daily after supper.     . carvedilol (COREG) 3.125 MG tablet Take 1 tablet (3.125 mg total) by mouth 2 (two) times daily. 60 tablet 11   No current facility-administered medications for this visit.    REVIEW OF SYSTEMS:   [X]  denotes positive finding, [ ]  denotes negative finding Cardiac  Comments:  Chest pain or chest pressure: x   Shortness of breath upon exertion: x   Short of breath when lying flat:    Irregular heart rhythm:        Vascular    Pain in calf, thigh, or hip brought on by ambulation: x   Pain in feet at night that wakes you up from your sleep:     Blood clot in your veins:    Leg swelling:         Pulmonary    Oxygen at home:    Productive cough:     Wheezing:         Neurologic    Sudden weakness in arms or legs:     Sudden numbness in arms or legs:     Sudden onset of difficulty speaking or  slurred speech:    Temporary loss of vision in one eye:  Problems with dizziness:         Gastrointestinal    Blood in stool:      Vomited blood:         Genitourinary    Burning when urinating:     Blood in urine:        Psychiatric    Major depression:         Hematologic    Bleeding problems:    Problems with blood clotting too easily:        Skin    Rashes or ulcers:        Constitutional    Fever or chills:     PHYSICAL EXAM:   Vitals:   08/06/20 1136 08/06/20 1138  BP: (!) 150/82 (!) 157/89  Pulse: 61   Resp: 20   Temp: 97.6 F (36.4 C)   SpO2: 96%   Weight: 204 lb (92.5 kg)   Height: 6\' 3"  (1.905 m)     GENERAL: The patient is a well-nourished male, in no acute distress. The vital signs are documented above. CARDIAC: There is a regular rate and rhythm.  VASCULAR: Palpable pedal pulses PULMONARY: Nonlabored respirations MUSCULOSKELETAL: There are no major deformities or cyanosis. NEUROLOGIC: No focal weakness or paresthesias are detected. SKIN: There are no ulcers or rashes noted. PSYCHIATRIC: The patient has a normal affect.  STUDIES:   I have reviewed the following: Right Carotid: Velocities in the right ICA are consistent with a 80-99%         stenosis. Non-hemodynamically significant plaque <50% noted  in         the CCA.   Left Carotid: Velocities in the left ICA are consistent with a 60-79%  stenosis.        Non-hemodynamically significant plaque <50% noted in the  CCA. The        ECA appears >50% stenosed.   Vertebrals: Left vertebral artery demonstrates antegrade flow. Right  vertebral        artery was not visualized.  Subclavians: Normal flow hemodynamics were seen in bilateral subclavian        arteries.   ASSESSMENT and PLAN   Asymptomatic carotid stenosis, right greater than left: The patient has multiple comorbidities.  Therefore, I have recommended getting a CT angiogram to  determine whether or not he is a candidate for TCAR.  Once this has been completed, I will follow-up with him and we will discuss the best option for revascularization.  He is on Eliquis, and so if we go the stenting route, he would need triple therapy for 30 days.   Leia Alf, MD, FACS [facility] [phone] Pager [phone]

## 2020-08-07 DIAGNOSIS — E119 Type 2 diabetes mellitus without complications: Secondary | ICD-10-CM | POA: Diagnosis not present

## 2020-08-07 DIAGNOSIS — I6529 Occlusion and stenosis of unspecified carotid artery: Secondary | ICD-10-CM | POA: Diagnosis not present

## 2020-08-07 DIAGNOSIS — I5022 Chronic systolic (congestive) heart failure: Secondary | ICD-10-CM | POA: Diagnosis not present

## 2020-08-07 DIAGNOSIS — I130 Hypertensive heart and chronic kidney disease with heart failure and stage 1 through stage 4 chronic kidney disease, or unspecified chronic kidney disease: Secondary | ICD-10-CM | POA: Diagnosis not present

## 2020-08-07 DIAGNOSIS — E1122 Type 2 diabetes mellitus with diabetic chronic kidney disease: Secondary | ICD-10-CM | POA: Diagnosis not present

## 2020-08-07 DIAGNOSIS — M1990 Unspecified osteoarthritis, unspecified site: Secondary | ICD-10-CM | POA: Diagnosis not present

## 2020-08-07 DIAGNOSIS — I714 Abdominal aortic aneurysm, without rupture: Secondary | ICD-10-CM | POA: Diagnosis not present

## 2020-08-07 DIAGNOSIS — I480 Paroxysmal atrial fibrillation: Secondary | ICD-10-CM | POA: Diagnosis not present

## 2020-08-07 DIAGNOSIS — N1831 Chronic kidney disease, stage 3a: Secondary | ICD-10-CM | POA: Diagnosis not present

## 2020-08-07 DIAGNOSIS — I350 Nonrheumatic aortic (valve) stenosis: Secondary | ICD-10-CM | POA: Diagnosis not present

## 2020-08-07 DIAGNOSIS — E785 Hyperlipidemia, unspecified: Secondary | ICD-10-CM | POA: Diagnosis not present

## 2020-08-07 DIAGNOSIS — I2510 Atherosclerotic heart disease of native coronary artery without angina pectoris: Secondary | ICD-10-CM | POA: Diagnosis not present

## 2020-08-08 ENCOUNTER — Other Ambulatory Visit: Payer: Self-pay | Admitting: *Deleted

## 2020-08-08 DIAGNOSIS — I6523 Occlusion and stenosis of bilateral carotid arteries: Secondary | ICD-10-CM

## 2020-08-09 ENCOUNTER — Other Ambulatory Visit: Payer: Self-pay | Admitting: *Deleted

## 2020-08-09 DIAGNOSIS — I6523 Occlusion and stenosis of bilateral carotid arteries: Secondary | ICD-10-CM

## 2020-08-14 ENCOUNTER — Other Ambulatory Visit: Payer: Self-pay

## 2020-08-14 ENCOUNTER — Ambulatory Visit (HOSPITAL_COMMUNITY)
Admission: RE | Admit: 2020-08-14 | Discharge: 2020-08-14 | Disposition: A | Discharge status: Home / Self Care | Payer: Medicare Other | Source: Ambulatory Visit | Attending: Cardiology | Admitting: Cardiology

## 2020-08-14 ENCOUNTER — Telehealth (HOSPITAL_COMMUNITY): Payer: Self-pay | Admitting: Vascular Surgery

## 2020-08-14 ENCOUNTER — Telehealth (HOSPITAL_COMMUNITY): Payer: Self-pay | Admitting: *Deleted

## 2020-08-14 ENCOUNTER — Encounter (HOSPITAL_COMMUNITY): Payer: Self-pay | Admitting: Cardiology

## 2020-08-14 VITALS — BP 124/80 | HR 71 | Wt 203.0 lb

## 2020-08-14 DIAGNOSIS — E785 Hyperlipidemia, unspecified: Secondary | ICD-10-CM | POA: Insufficient documentation

## 2020-08-14 DIAGNOSIS — J90 Pleural effusion, not elsewhere classified: Secondary | ICD-10-CM | POA: Diagnosis not present

## 2020-08-14 DIAGNOSIS — Z79899 Other long term (current) drug therapy: Secondary | ICD-10-CM | POA: Insufficient documentation

## 2020-08-14 DIAGNOSIS — I491 Atrial premature depolarization: Secondary | ICD-10-CM | POA: Insufficient documentation

## 2020-08-14 DIAGNOSIS — I714 Abdominal aortic aneurysm, without rupture, unspecified: Secondary | ICD-10-CM

## 2020-08-14 DIAGNOSIS — I5032 Chronic diastolic (congestive) heart failure: Secondary | ICD-10-CM | POA: Diagnosis not present

## 2020-08-14 DIAGNOSIS — Z7901 Long term (current) use of anticoagulants: Secondary | ICD-10-CM | POA: Diagnosis not present

## 2020-08-14 DIAGNOSIS — I2510 Atherosclerotic heart disease of native coronary artery without angina pectoris: Secondary | ICD-10-CM | POA: Insufficient documentation

## 2020-08-14 DIAGNOSIS — Z8249 Family history of ischemic heart disease and other diseases of the circulatory system: Secondary | ICD-10-CM | POA: Diagnosis not present

## 2020-08-14 DIAGNOSIS — Z951 Presence of aortocoronary bypass graft: Secondary | ICD-10-CM | POA: Diagnosis not present

## 2020-08-14 DIAGNOSIS — I4891 Unspecified atrial fibrillation: Secondary | ICD-10-CM | POA: Diagnosis not present

## 2020-08-14 DIAGNOSIS — I6529 Occlusion and stenosis of unspecified carotid artery: Secondary | ICD-10-CM

## 2020-08-14 DIAGNOSIS — I350 Nonrheumatic aortic (valve) stenosis: Secondary | ICD-10-CM | POA: Diagnosis not present

## 2020-08-14 DIAGNOSIS — Z7902 Long term (current) use of antithrombotics/antiplatelets: Secondary | ICD-10-CM | POA: Insufficient documentation

## 2020-08-14 DIAGNOSIS — I252 Old myocardial infarction: Secondary | ICD-10-CM | POA: Insufficient documentation

## 2020-08-14 DIAGNOSIS — I480 Paroxysmal atrial fibrillation: Secondary | ICD-10-CM

## 2020-08-14 DIAGNOSIS — I4510 Unspecified right bundle-branch block: Secondary | ICD-10-CM | POA: Insufficient documentation

## 2020-08-14 DIAGNOSIS — Z87891 Personal history of nicotine dependence: Secondary | ICD-10-CM | POA: Diagnosis not present

## 2020-08-14 DIAGNOSIS — R079 Chest pain, unspecified: Secondary | ICD-10-CM | POA: Diagnosis not present

## 2020-08-14 LAB — BASIC METABOLIC PANEL
Anion gap: 10 (ref 5–15)
BUN: 17 mg/dL (ref 8–23)
CO2: 29 mmol/L (ref 22–32)
Calcium: 9.4 mg/dL (ref 8.9–10.3)
Chloride: 102 mmol/L (ref 98–111)
Creatinine, Ser: 1.19 mg/dL (ref 0.61–1.24)
GFR, Estimated: 59 mL/min — ABNORMAL LOW (ref >60)
Glucose, Bld: 94 mg/dL (ref 70–99)
Potassium: 4.2 mmol/L (ref 3.5–5.1)
Sodium: 141 mmol/L (ref 135–145)

## 2020-08-14 LAB — CBC
HCT: 43.2 % (ref 39.0–52.0)
Hemoglobin: 13.8 g/dL (ref 13.0–17.0)
MCH: 31.3 pg (ref 26.0–34.0)
MCHC: 31.9 g/dL (ref 30.0–36.0)
MCV: 98 fL (ref 80.0–100.0)
Platelets: 147 10*3/uL — ABNORMAL LOW (ref 150–400)
RBC: 4.41 MIL/uL (ref 4.22–5.81)
RDW: 14.3 % (ref 11.5–15.5)
WBC: 7.1 10*3/uL (ref 4.0–10.5)
nRBC: 0 % (ref 0.0–0.2)

## 2020-08-14 NOTE — Progress Notes (Signed)
HDate:  08/14/2020   ID:  [name], DOB 1931-04-14, MRN [id number]   Provider location: [place] Type of Visit: Established patient  PCP:  Shon Baton, MD  Cardiologist:  Dr. Aundra Dubin  Chief Complaint: Shortness of breath   History of Present Illness: Eric Gordon is a 86 y.o. male with history of CAD s/p CABG.  He has had trouble long-term with exertional dyspnea.  Dyspnea triggered evaluation in 2012 leading to CABG but was not resolved by CABG.  PFTs in 3/14 showed only mild obstructive airways disease and V/Q scan showed no PE.  Echo in 9/14 showed normal LV systolic function with moderately dilated RV. Christine in 2/15 showed normal right and left heart filling pressures and normal PA pressure.  Finally, he had a CPX in 3/15 that showed normal capacity compared to age-matched sedentary norms.  He was noted to have chronotropic incompetence.  At a prior appointment, I took him off metoprolol given chronotropic incompetence noted on CPX.  Dyspnea improved significantly with weight loss.  He had Cardiolite in 8/18 with EF 52%, no ischemia or infarction.   He was admitted in 1/20 with NSTEMI.  LHC showed new occlusion of the branch of SVG-ramus and OM that touched down on OM.  There were also serial 70% stenoses in the mid/distal RCA.  There was not thought to be an interventional option and patient was treated medically. He was discharged home but presented a couple days later with recurrent chest pain and dyspnea.  Cath was repeated showing no change from prior.  This admission, he had FFR of the RCA which did not suggest hemodynamic significance.  Echo showed EF 55-60%, normal RV.  CTA did not show a PE. He was noted to be volume overloaded and was diuresed.  He was also noted to be in atrial fibrillation with RVR transiently.  ASA/Plavix was stopped and Eliquis was started. He was discharged to SNF.  Atrial fibrillation recurred and he was started on  amiodarone with conversion back to NSR.   In 4/20, he was admitted with left spontaneous PTX requiring chest tube.  After this, he developed recurrent left pleural effusions requiring thoracentesis x 3 so far.  Pleural fluid was exudative with eosinophil predominance.  With elevated ESR, there was concern that amiodarone could be involved, so this medication was stopped.  CT chest in 6/20 showed LLL consolidation/collapse with small left effusion, there was a rim-enhancing lesion posterior to the heart between esophagus and left atrium, ?loculated fluid.  TEE was done in 7/20 and confirmed loculated pleural effusion behind the left atrium.  In 7/20, patient had VATS on the left.   Carotid dopplers in 9/21 showed 80-99% RICA stenosis.  AAA [place] in 9/21 showed 4.5 cm AAA. Patient was referred to Dr. Trula Slade for evaluation.   Patient returns for followup of dyspnea and CAD. Stable dyspnea after walking 100-200 yards or up a hill.  He works out at [employer] 4 days/week.  He has been having episodes of chest tightness for > 6 months, several times day, lasting < 1 minute.  They are not associated with exertion, usually at rest.  No stroke-like symptoms.   ECG (personally reviewed): NSR, LAFB, RBBB  Labs (2/15): K 4.2, creatinine 1.2, LDL 87, HDL 39, BNP 71 Labs (4/15): K 3.9, creatinine 1.1 Labs (12/15): LDL 73, HDL 37, K 3.9, creatinine 1.0 Labs (12/16): LDL 71, HDL 49 Labs (3/17): K 4.2, creatinine 1.28,  HCT 45 Labs (8/18): LDL 82, HDL 41, TSH normal, hgb 16.3, K 4.1, creatinine 1.05 Labs (2/20): LFTs normal, pro BNP 842 Labs (3/20): K 3.8, creatinine 1.48, TSH mildly elevated at 6.97, free T3 and free T4 normal Labs (4/20): K 3.9, creatinine 1.36 Labs (2/20): LDL 52 Labs (8/20): BNP 241, creatinine 1.21  Labs (9/20): BNP 244, K 4, creatinine 1.12 Labs (2/21): K 4.3, creatinine 1.26, BNP 271 Labs (6/21): K 4.2, creatinine 1.11, LDL 57  PMH: 1. Essential tremor 2. CAD: s/p CABG in 3/12.   -  Cardiolite (8/18): EF 52%, no ischemia/infarction.  - NSTEMI (1/20):  LHC showed new occlusion of the branch of SVG-ramus and OM that touched down on OM.  There were also serial 70% stenoses in the mid/distal RCA.  FFR of RCA was negative.  3. Atrial fibrillation: Only noted post-op CABG.  4. PE: Post-op CABG in 2012.  5. AAA: 3.6 cm on [place] in 3/14.  3.6 cm on [place] in 3/15. 3.6 cm on [place] 8/16. 4.1 cm on [place] 4/17.  - AAA [place] (5/18): 4.1 cm AAA.  - AAA [place] (5/19): 4.1 cm AAA.  - AAA [place] (4/20): 4.1 cm AAA.  - AAA [place] (9/21): 4.5 cm AAA 6. OSA: On CPAP.  7. Carotid stenosis: Carotid dopplers (9/16) with 38-46% LICA stenosis.  Carotid dopplers (6/59) with 93-57% LICA stenosis. Carotid dopplers (3/16) with 60-79% RCIA stenosis, 01-77% LICA stenosis.  - Carotid dopplers (8/16) with 40-59% RICA stenosis, 93-90% LICA stenosis.  - Carotid dopplers (8/17) with 30-09% RICA, 23-30% LICA. - Carotid dopplers (9/18) with 07-62% RICA, 26-33% LICA.   - Carotid dopplers (9/19) with 35-45% RICA, 62-56% LICA.  - Carotid dopplers (9/20) with 60-79% BICA - Carotid dopplers (9/21): 38-93% RICA, 73-42% LICA 8. Asthma 9. PNA x 2 10. Chronic diastolic CHF/dyspnea: Echo (9/14) with EF 60-65%, mild LVH, very mild AS with mean gradient 10 mmHg, RV moderately dilated.  PFTs (3/14) showed mild obstructive airways disease.  V/Q scan (3/14) with no PE.  ENT workup for upper respiratory causes was negative.  RHC (2/15) with mean RA 7, PA 32/12, mean PCWP 13, CI 3.18.  CPX (3/15) with peak VO2 16.4, VE/VCO2 33; normal when compared to age-matched sedentary normals; chronotropic incompetence was noted.  Dyspnea was improved with weight loss.  - Echo (8/17): EF 60-65%, mild AS.  - Echo (1/20): EF 55-60%, normal RV size and systolic function.  - Echo (6/20): EF 55%, mild LVH, mild AS, normal RV size and systolic function, ?LA mass or mass impinging on posterior LA.  - TEE (7/20): EF 50-55%, mild LVH, basal inferolateral aneurysm, normal RV  size/systolic function, loculated pleural fluid behind LA.  11. HTN 12. Aortic stenosis: Mild.  13. Ascending aortic aneurysm: CTA chest with 4.4 cm ascending aorta in 1/20.  - 4.2 cm ascending aorta on CT chest 6/20.  14. Atrial fibrillation: Paroxysmal.  15. CKD: Stage 3.  16. Left lung spontaneous PTX then recurrent left pleural effusion.  - Left VATS in 7/20.  17. Palpitations:  - Zio patch (6/20): NSR with 1 short NSVT run and few short SVT runs, no atrial fibrillation.  - Zio patch 2 wks (2/21): 6 short NSVT runs, 66 short SVT runs, rare PACs/PVCs.    Current Outpatient Medications  Medication Sig Dispense Refill  . acetaminophen (TYLENOL) 500 MG tablet Take 1,000 mg by mouth every 6 (six) hours as needed for moderate pain or headache.    Marland Kitchen apixaban (ELIQUIS) 5  MG TABS tablet Take 1 tablet (5 mg total) by mouth 2 (two) times daily. 60 tablet 5  . Ascorbic Acid 500 MG CAPS Take 500 mg by mouth daily.    Marland Kitchen atorvastatin (LIPITOR) 80 MG tablet Take 1 tablet (80 mg total) by mouth daily. 30 tablet 6  . carvedilol (COREG) 3.125 MG tablet Take 1 tablet (3.125 mg total) by mouth 2 (two) times daily. 60 tablet 11  . cetirizine (ZYRTEC) 10 MG tablet Take 10 mg by mouth daily.      . cholecalciferol (VITAMIN D) 1000 UNITS tablet Take 1,000 Units by mouth every evening.     . furosemide (LASIX) 40 MG tablet Take 40 mg by mouth daily.    . isosorbide mononitrate (IMDUR) 30 MG 24 hr tablet Take 2 tablets (60 mg total) by mouth daily.    Marland Kitchen levothyroxine (SYNTHROID, LEVOTHROID) 50 MCG tablet Take 50 mcg by mouth daily.    . Multiple Vitamins-Minerals (CENTRUM SILVER PO) Take 1 tablet by mouth daily.     . pantoprazole (PROTONIX) 40 MG tablet Take 40 mg by mouth daily.     . tamsulosin (FLOMAX) 0.4 MG CAPS capsule Take 0.4 mg by mouth daily after supper.      No current facility-administered medications for this encounter.    Allergies:   Ancef [cefazolin sodium] and Vancomycin   Social  History:  The patient  reports that he quit smoking about 36 years ago. His smoking use included cigarettes. He has a 35.00 pack-year smoking history. He has never used smokeless tobacco. He reports current alcohol use of about 2.0 standard drinks of alcohol per week. He reports that he does not use drugs.   Family History:  The patient's family history includes Coronary artery disease in his mother; Heart disease in his mother; Hypertension in his mother.   ROS:  Please see the history of present illness.   All other systems are personally reviewed and negative.   Exam:   BP 124/80   Pulse 71   Wt 92.1 kg (203 lb)   SpO2 98%   BMI 25.37 kg/m  General: NAD Neck: No JVD, no thyromegaly or thyroid nodule.  Lungs: Clear to auscultation bilaterally with normal respiratory effort. CV: Nondisplaced PMI.  Heart regular S1/S2, no S3/S4, no murmur.  No peripheral edema.  Right carotid bruit.  Normal pedal pulses.  Abdomen: Soft, nontender, no hepatosplenomegaly, no distention.  Skin: Intact without lesions or rashes.  Neurologic: Alert and oriented x 3.  Psych: Normal affect. Extremities: No clubbing or cyanosis.  HEENT: Normal.   Recent Labs: 10/25/2019: B Natriuretic Peptide 270.7 08/14/2020: BUN 17; Creatinine, Ser 1.19; Hemoglobin 13.8; Platelets 147; Potassium 4.2; Sodium 141  Personally reviewed   Wt Readings from Last 3 Encounters:  08/14/20 92.1 kg (203 lb)  08/06/20 92.5 kg (204 lb)  02/21/20 92.8 kg (204 lb 9.6 oz)      ASSESSMENT AND PLAN:  1. CAD: s/p CABG 3/12. NSTEMI 1/20, LHC showed new occlusion of the branch of SVG-ramus and OM that touched down on OM.  There were also serial 70% stenoses in the mid/distal RCA.  FFR of RCA was negative.  No interventional target.  He has episodes of atypical chest pain, not with exertion and present for > 6 months.   - No ASA given Eliquis use.  - Continue Imdur 60 mg daily.  - Continue atorvastatin 80 daily.   - Off ranolazine with  prolonged QTc.  - With atypical chest  pain and plan for carotid intervention soon, will arrange for ETT-Cardiolite for risk stratification.  2. Hyperlipidemia: Goal LDL < 70.   - On atorvastatin 80 daily, last LDL 57.   3. Chronic diastolic CHF: TEE in 5/02 showed EF 50-55%, basal inferolateral aneurysm, and normal RV.  He is not volume overloaded on exam.  I think that his dyspnea is related to his left lung disease (mild and stable). - Continue Lasix 40 mg daily.  BMET today.   4. Spontaneous left PTX with recurrent left-sided pleural effusion:  He is s/p left VATS/pleurodesis in 7/20.    5. Carotid stenosis: Severe, asymptomatic RICA stenosis.  - Seeing Dr. Trula Slade, planning for carotid intervention.    6. AAA: 4.5 cm AAA in 9/21, repeat in 1 year.      7. Aortic stenosis: Mild.   8. Atrial fibrillation: Paroxysmal. He is significantly symptomatic while in atrial fibrillation.  He is in NSR today.  He is off amiodarone due to concern for pulmonary toxicity.   - Continue Eliquis 5 mg bid. CBC today.   Recommended follow-up:  6 months.   Signed, [name], MD  08/14/2020  [facility] [address] Heart and [place] [address] [phone] (office) [phone] (fax)

## 2020-08-14 NOTE — Patient Instructions (Addendum)
Labs done today, your results will be available in MyChart, we will contact you for abnormal readings.  Your physician has requested that you have en exercise stress myoview. For further information please visit [email or web address]. Please follow instruction sheet, as given. YOU WILL BE CALLED TO SCHEDULE THIS  Please call our office in April 2022 to schedule your follow up appointment  If you have any questions or concerns before your next appointment please send [place] a message through [place] or call our office at [phone].    TO LEAVE A MESSAGE FOR THE NURSE SELECT OPTION 2, PLEASE LEAVE A MESSAGE INCLUDING: . YOUR NAME . DATE OF BIRTH . CALL BACK NUMBER . REASON FOR CALL**this is important as we prioritize the call backs  Welcome AS LONG AS YOU CALL BEFORE 4:00 PM  At the [facility], you and your health needs are our priority. As part of our continuing mission to provide you with exceptional heart care, we have created designated Provider Care Teams. These Care Teams include your primary Cardiologist (physician) and Advanced Practice Providers (APPs- Physician Assistants and Nurse Practitioners) who all work together to provide you with the care you need, when you need it.   You may see any of the following providers on your designated Care Team at your next follow up: Marland Kitchen Dr Glori Bickers . Dr [name] . Darrick Grinder, NP . Lyda Jester, PA . Audry Riles, PharmD   Please be sure to bring in all your medications bottles to every appointment.     How to Prepare for Your Myoview Test (stress test):  1. Please do not take these medications before your test: Carvedilol the AM of the test  2. Your remaining medications may be taken with water. 3. Nothing to eat or drink, except water, 4 hours prior to arrival time.  NO caffeine/decaffeinated products, or chocolate 12 hours prior to arrival. 4. Ladies, please do not wear  dresses.  Skirts or pants are approprate, please wear a short sleeve shirt. 5. NO perfume, cologne or lotion 6. Wear comfortable walking shoes.  NO HEELS! 7. Total time is 3 to 4 hours; you may want to bring reading material for the waiting time. 8. Please [facility] to [facility] for your test  What to expect after you arrive:  Once you arrive and check in for your appointment an IV will be started in your arm.  Then the Technoligist will inject a small amount of radioactive tracer.  There will be a 1 hour waiting period after this injection.  A series of pictures will be taken of your heart following this waiting period.  You will be prepped for the stress portion of the test.  During the stress portion of your test you will either walk on a treadmill or receive a small, safe amount of radioactive tracer injected in your IV.  After the stress portion, there is a short rest period during which time your heart and blood pressure will be monitored.  After the short rest period the Technologist will begin your second set of pictures.  Your doctor will inform you of your test results within 7-10 business days.  In preparation for your appointment, medication and supplies will be purchased.  Appointment availability is limited, so if you need to cancel or reschedule please call the office at [phone] 24 hours in advance to avoid a cancellation fee of $100.00  IF [place]  BE PREGNANT, PLEASE INFORM THE TECHNOLOGIST.

## 2020-08-14 NOTE — Telephone Encounter (Signed)
Received fax from [facility], pt needs clearance to hold Eliquis for lumbar ESI  Per Dr Aundra Dubin ok to come off Eliquis for procedure  Note faxed back to them at [phone]

## 2020-08-20 ENCOUNTER — Encounter (HOSPITAL_COMMUNITY): Payer: Self-pay

## 2020-08-20 ENCOUNTER — Other Ambulatory Visit: Payer: Self-pay

## 2020-08-20 ENCOUNTER — Ambulatory Visit (HOSPITAL_COMMUNITY)
Admission: RE | Admit: 2020-08-20 | Discharge: 2020-08-20 | Disposition: A | Discharge status: Home / Self Care | Payer: Medicare Other | Source: Ambulatory Visit | Attending: Surgery | Admitting: Surgery

## 2020-08-20 DIAGNOSIS — I672 Cerebral atherosclerosis: Secondary | ICD-10-CM | POA: Diagnosis not present

## 2020-08-20 DIAGNOSIS — I6523 Occlusion and stenosis of bilateral carotid arteries: Secondary | ICD-10-CM | POA: Insufficient documentation

## 2020-08-20 DIAGNOSIS — Z9889 Other specified postprocedural states: Secondary | ICD-10-CM | POA: Diagnosis not present

## 2020-08-20 DIAGNOSIS — L98499 Non-pressure chronic ulcer of skin of other sites with unspecified severity: Secondary | ICD-10-CM | POA: Diagnosis not present

## 2020-08-20 MED ORDER — IOHEXOL 350 MG/ML SOLN
75.0000 mL | Freq: Once | INTRAVENOUS | Status: AC | PRN
  Administered 2020-08-20: 75 mL via INTRAVENOUS

## 2020-08-21 ENCOUNTER — Encounter: Payer: Self-pay | Admitting: Gastroenterology

## 2020-08-24 ENCOUNTER — Telehealth (HOSPITAL_COMMUNITY): Payer: Self-pay | Admitting: *Deleted

## 2020-08-24 ENCOUNTER — Other Ambulatory Visit (HOSPITAL_COMMUNITY)
Admission: RE | Admit: 2020-08-24 | Discharge: 2020-08-24 | Disposition: A | Discharge status: Home / Self Care | Payer: Medicare Other | Source: Ambulatory Visit | Attending: Cardiology | Admitting: Cardiology

## 2020-08-24 DIAGNOSIS — Z20822 Contact with and (suspected) exposure to covid-19: Secondary | ICD-10-CM | POA: Diagnosis not present

## 2020-08-24 DIAGNOSIS — Z01812 Encounter for preprocedural laboratory examination: Secondary | ICD-10-CM | POA: Insufficient documentation

## 2020-08-24 LAB — SARS CORONAVIRUS 2 (TAT 6-24 HRS): SARS Coronavirus 2: NEGATIVE

## 2020-08-24 NOTE — Telephone Encounter (Signed)
Close encounter 

## 2020-08-27 ENCOUNTER — Encounter: Payer: Self-pay | Admitting: Surgery

## 2020-08-27 ENCOUNTER — Ambulatory Visit (INDEPENDENT_AMBULATORY_CARE_PROVIDER_SITE_OTHER): Payer: Medicare Other | Admitting: Surgery

## 2020-08-27 ENCOUNTER — Other Ambulatory Visit: Payer: Self-pay

## 2020-08-27 VITALS — BP 142/81 | HR 61 | Temp 98.0°F | Resp 20 | Ht 75.0 in | Wt 204.0 lb

## 2020-08-27 DIAGNOSIS — I6523 Occlusion and stenosis of bilateral carotid arteries: Secondary | ICD-10-CM | POA: Diagnosis not present

## 2020-08-27 MED ORDER — CLOPIDOGREL BISULFATE 75 MG PO TABS: 75.0000 mg | ORAL_TABLET | Freq: Every day | ORAL | 3 refills | Status: DC

## 2020-08-27 NOTE — Progress Notes (Signed)
[facility] [place]  Patient name: Eric Gordon MRN: [id number] DOB: 1930-11-28 Sex: male   REASON FOR VISIT:    Follow up  [place] ILLNESS:   Eric Gordon is a 86 y.o. male, who is referred for bilateral carotid stenosis.  He is asymptomatic.  Specifically, he denies numbness or weakness in either extremity.  He denies slurred speech.  He denies amaurosis fugax.  I sent him for CT scan.  He is back today to discuss these results.  Patient has a history of coronary artery disease status post CABG.  He has had a NSTEMI from occluded bypass grafts.  He has atrial fibrillation.  He is anticoagulated on Eliquis.  He has had a spontaneous pneumothorax requiring chest tube decompression he gets dyspneic at 150 to 200 yards.  He is medically managed for hypertension.  He takes a statin for hypercholesterolemia.  He has a known abdominal aortic aneurysm with ultrasound on 06/08/2020 showing maximal diameter 4.1 cm.   PAST MEDICAL HISTORY:   Past Medical History:  Diagnosis Date  . Abdominal aortic aneurysm (AAA) ([place])   . Arthritis    "my whole body" (03/19/2018)  . Atrial fibrillation ([place])   . CAD (coronary artery disease)   . CHF (congestive heart failure) ([place])   . Chronic anticoagulation    PE  . Chronic lower back pain   . GERD (gastroesophageal reflux disease)   . High cholesterol   . Hypertension   . Hypothyroidism   . Myocardial infarction [facility])    2 in Jan. 2019  . OSA on CPAP    uses CPAP  . Pneumonia    "now and once before" (03/19/2018)  . Pulmonary embolism [facility])    April 2012 after CABG  . S/P CABG (coronary artery bypass graft)      FAMILY HISTORY:   Family History  Problem Relation Age of Onset  . Coronary artery disease Mother   . Hypertension Mother   . Heart disease Mother     SOCIAL HISTORY:   Social History   Tobacco Use  . Smoking status: Former Smoker     Packs/day: 1.00    Years: 35.00    Pack years: 35.00    Types: Cigarettes    Quit date: 02/18/1984    Years since quitting: 36.5  . Smokeless tobacco: Never Used  Substance Use Topics  . Alcohol use: Yes    Alcohol/week: 2.0 standard drinks    Types: 2 Standard drinks or equivalent per week     ALLERGIES:   Allergies  Allergen Reactions  . Ancef [Cefazolin Sodium] Hives  . Vancomycin Rash     CURRENT MEDICATIONS:   Current Outpatient Medications  Medication Sig Dispense Refill  . acetaminophen (TYLENOL) 500 MG tablet Take 1,000 mg by mouth every 6 (six) hours as needed for moderate pain or headache.    Marland Kitchen apixaban (ELIQUIS) 5 MG TABS tablet Take 1 tablet (5 mg total) by mouth 2 (two) times daily. 60 tablet 5  . Ascorbic Acid 500 MG CAPS Take 500 mg by mouth daily.    Marland Kitchen atorvastatin (LIPITOR) 80 MG tablet Take 1 tablet (80 mg total) by mouth daily. 30 tablet 6  . cetirizine (ZYRTEC) 10 MG tablet Take 10 mg by mouth daily.      . cholecalciferol (VITAMIN D) 1000 UNITS tablet Take 1,000 Units by mouth every evening.     . furosemide (LASIX) 40 MG tablet Take 40 mg  by mouth daily.    . isosorbide mononitrate (IMDUR) 30 MG 24 hr tablet Take 2 tablets (60 mg total) by mouth daily.    Marland Kitchen levothyroxine (SYNTHROID, LEVOTHROID) 50 MCG tablet Take 50 mcg by mouth daily.    . Multiple Vitamins-Minerals (CENTRUM SILVER PO) Take 1 tablet by mouth daily.     . pantoprazole (PROTONIX) 40 MG tablet Take 40 mg by mouth daily.     . tamsulosin (FLOMAX) 0.4 MG CAPS capsule Take 0.4 mg by mouth daily after supper.     . carvedilol (COREG) 3.125 MG tablet Take 1 tablet (3.125 mg total) by mouth 2 (two) times daily. 60 tablet 11   No current facility-administered medications for this visit.    REVIEW OF SYSTEMS:   [X]  denotes positive finding, [ ]  denotes negative finding Cardiac  Comments:  Chest pain or chest pressure:    Shortness of breath upon exertion:    Short of breath when lying  flat:    Irregular heart rhythm:        Vascular    Pain in calf, thigh, or hip brought on by ambulation:    Pain in feet at night that wakes you up from your sleep:     Blood clot in your veins:    Leg swelling:         Pulmonary    Oxygen at home:    Productive cough:     Wheezing:         Neurologic    Sudden weakness in arms or legs:     Sudden numbness in arms or legs:     Sudden onset of difficulty speaking or slurred speech:    Temporary loss of vision in one eye:     Problems with dizziness:         Gastrointestinal    Blood in stool:     Vomited blood:         Genitourinary    Burning when urinating:     Blood in urine:        Psychiatric    Major depression:         Hematologic    Bleeding problems:    Problems with blood clotting too easily:        Skin    Rashes or ulcers:        Constitutional    Fever or chills:      PHYSICAL EXAM:   Vitals:   08/27/20 1514  BP: (!) 142/81  Pulse: 61  Resp: 20  Temp: 98 F (36.7 C)  SpO2: 96%  Weight: 204 lb (92.5 kg)  Height: 6\' 3"  (1.905 m)    GENERAL: The patient is a well-nourished male, in no acute distress. The vital signs are documented above. CARDIAC: There is a regular rate and rhythm.  PULMONARY: Non-labored respirations ABDOMEN: Soft and non-tender with normal pitched bowel sounds.  MUSCULOSKELETAL: There are no major deformities or cyanosis. NEUROLOGIC: No focal weakness or paresthesias are detected. SKIN: There are no ulcers or rashes noted. PSYCHIATRIC: The patient has a normal affect.  STUDIES:   I have reviewed the CA with the following findings: 1. Prominent atherosclerotic changes of the right carotid bifurcation with soft and calcified plaques resulting approximately 90% stenosis. A small plaque ulceration is also noted. 2. Atherosclerotic changes in the left carotid bifurcation with soft and calcified plaque resulting in approximately 85% stenosis.    MEDICAL ISSUES:    Bilateral asymptomatic carotid stenosis, right  greater than left.  After reviewing his images, I think that he would be a good candidate for TCAR.  I discussed in detail the operation including the risk of stroke and bleeding.  We will do the right side first which has the greatest degree of stenosis (90%).  He is on Eliquis which will need to be discontinued prior to surgery.  I have given him a prescription for Plavix and he will start taking an aspirin.  His procedure is been scheduled for Wednesday, December 23    Leia Alf, MD, [facility] [phone] Pager [phone]

## 2020-08-27 NOTE — H&P (View-Only) (Signed)
[facility] [place]  Patient name: Eric Gordon MRN: [id number] DOB: 04-04-1931 Sex: male   REASON FOR VISIT:    Follow up  [place] ILLNESS:   Eric Gordon is a 86 y.o. male, who is referred for bilateral carotid stenosis.  He is asymptomatic.  Specifically, he denies numbness or weakness in either extremity.  He denies slurred speech.  He denies amaurosis fugax.  I sent him for CT scan.  He is back today to discuss these results.  Patient has a history of coronary artery disease status post CABG.  He has had a NSTEMI from occluded bypass grafts.  He has atrial fibrillation.  He is anticoagulated on Eliquis.  He has had a spontaneous pneumothorax requiring chest tube decompression he gets dyspneic at 150 to 200 yards.  He is medically managed for hypertension.  He takes a statin for hypercholesterolemia.  He has a known abdominal aortic aneurysm with ultrasound on 06/08/2020 showing maximal diameter 4.1 cm.   PAST MEDICAL HISTORY:   Past Medical History:  Diagnosis Date  . Abdominal aortic aneurysm (AAA) ([place])   . Arthritis    "my whole body" (03/19/2018)  . Atrial fibrillation ([place])   . CAD (coronary artery disease)   . CHF (congestive heart failure) ([place])   . Chronic anticoagulation    PE  . Chronic lower back pain   . GERD (gastroesophageal reflux disease)   . High cholesterol   . Hypertension   . Hypothyroidism   . Myocardial infarction [facility])    2 in Jan. 2019  . OSA on CPAP    uses CPAP  . Pneumonia    "now and once before" (03/19/2018)  . Pulmonary embolism [facility])    April 2012 after CABG  . S/P CABG (coronary artery bypass graft)      FAMILY HISTORY:   Family History  Problem Relation Age of Onset  . Coronary artery disease Mother   . Hypertension Mother   . Heart disease Mother     SOCIAL HISTORY:   Social History   Tobacco Use  . Smoking status: Former Smoker     Packs/day: 1.00    Years: 35.00    Pack years: 35.00    Types: Cigarettes    Quit date: 02/18/1984    Years since quitting: 36.5  . Smokeless tobacco: Never Used  Substance Use Topics  . Alcohol use: Yes    Alcohol/week: 2.0 standard drinks    Types: 2 Standard drinks or equivalent per week     ALLERGIES:   Allergies  Allergen Reactions  . Ancef [Cefazolin Sodium] Hives  . Vancomycin Rash     CURRENT MEDICATIONS:   Current Outpatient Medications  Medication Sig Dispense Refill  . acetaminophen (TYLENOL) 500 MG tablet Take 1,000 mg by mouth every 6 (six) hours as needed for moderate pain or headache.    Marland Kitchen apixaban (ELIQUIS) 5 MG TABS tablet Take 1 tablet (5 mg total) by mouth 2 (two) times daily. 60 tablet 5  . Ascorbic Acid 500 MG CAPS Take 500 mg by mouth daily.    Marland Kitchen atorvastatin (LIPITOR) 80 MG tablet Take 1 tablet (80 mg total) by mouth daily. 30 tablet 6  . cetirizine (ZYRTEC) 10 MG tablet Take 10 mg by mouth daily.      . cholecalciferol (VITAMIN D) 1000 UNITS tablet Take 1,000 Units by mouth every evening.     . furosemide (LASIX) 40 MG tablet Take 40 mg  by mouth daily.    . isosorbide mononitrate (IMDUR) 30 MG 24 hr tablet Take 2 tablets (60 mg total) by mouth daily.    Marland Kitchen levothyroxine (SYNTHROID, LEVOTHROID) 50 MCG tablet Take 50 mcg by mouth daily.    . Multiple Vitamins-Minerals (CENTRUM SILVER PO) Take 1 tablet by mouth daily.     . pantoprazole (PROTONIX) 40 MG tablet Take 40 mg by mouth daily.     . tamsulosin (FLOMAX) 0.4 MG CAPS capsule Take 0.4 mg by mouth daily after supper.     . carvedilol (COREG) 3.125 MG tablet Take 1 tablet (3.125 mg total) by mouth 2 (two) times daily. 60 tablet 11   No current facility-administered medications for this visit.    REVIEW OF SYSTEMS:   [X]  denotes positive finding, [ ]  denotes negative finding Cardiac  Comments:  Chest pain or chest pressure:    Shortness of breath upon exertion:    Short of breath when lying  flat:    Irregular heart rhythm:        Vascular    Pain in calf, thigh, or hip brought on by ambulation:    Pain in feet at night that wakes you up from your sleep:     Blood clot in your veins:    Leg swelling:         Pulmonary    Oxygen at home:    Productive cough:     Wheezing:         Neurologic    Sudden weakness in arms or legs:     Sudden numbness in arms or legs:     Sudden onset of difficulty speaking or slurred speech:    Temporary loss of vision in one eye:     Problems with dizziness:         Gastrointestinal    Blood in stool:     Vomited blood:         Genitourinary    Burning when urinating:     Blood in urine:        Psychiatric    Major depression:         Hematologic    Bleeding problems:    Problems with blood clotting too easily:        Skin    Rashes or ulcers:        Constitutional    Fever or chills:      PHYSICAL EXAM:   Vitals:   08/27/20 1514  BP: (!) 142/81  Pulse: 61  Resp: 20  Temp: 98 F (36.7 C)  SpO2: 96%  Weight: 204 lb (92.5 kg)  Height: 6\' 3"  (1.905 m)    GENERAL: The patient is a well-nourished male, in no acute distress. The vital signs are documented above. CARDIAC: There is a regular rate and rhythm.  PULMONARY: Non-labored respirations ABDOMEN: Soft and non-tender with normal pitched bowel sounds.  MUSCULOSKELETAL: There are no major deformities or cyanosis. NEUROLOGIC: No focal weakness or paresthesias are detected. SKIN: There are no ulcers or rashes noted. PSYCHIATRIC: The patient has a normal affect.  STUDIES:   I have reviewed the CA with the following findings: 1. Prominent atherosclerotic changes of the right carotid bifurcation with soft and calcified plaques resulting approximately 90% stenosis. A small plaque ulceration is also noted. 2. Atherosclerotic changes in the left carotid bifurcation with soft and calcified plaque resulting in approximately 85% stenosis.    MEDICAL ISSUES:    Bilateral asymptomatic carotid stenosis, right  greater than left.  After reviewing his images, I think that he would be a good candidate for TCAR.  I discussed in detail the operation including the risk of stroke and bleeding.  We will do the right side first which has the greatest degree of stenosis (90%).  He is on Eliquis which will need to be discontinued prior to surgery.  I have given him a prescription for Plavix and he will start taking an aspirin.  His procedure is been scheduled for Wednesday, December 23    Leia Alf, MD, [facility] [phone] Pager [phone]

## 2020-08-28 ENCOUNTER — Other Ambulatory Visit: Payer: Self-pay

## 2020-08-28 ENCOUNTER — Ambulatory Visit (HOSPITAL_COMMUNITY)
Admission: RE | Admit: 2020-08-28 | Discharge: 2020-08-28 | Disposition: A | Discharge status: Home / Self Care | Payer: Medicare Other | Source: Ambulatory Visit | Attending: Cardiology | Admitting: Cardiology

## 2020-08-28 DIAGNOSIS — R079 Chest pain, unspecified: Secondary | ICD-10-CM | POA: Diagnosis not present

## 2020-08-28 MED ORDER — TECHNETIUM TC 99M TETROFOSMIN IV KIT
10.9000 | PACK | Freq: Once | INTRAVENOUS | Status: AC | PRN
  Administered 2020-08-28: 10.9 via INTRAVENOUS
  Filled 2020-08-28: qty 11

## 2020-08-28 MED ORDER — TECHNETIUM TC 99M TETROFOSMIN IV KIT
29.5000 | PACK | Freq: Once | INTRAVENOUS | Status: AC | PRN
  Administered 2020-08-28: 29.5 via INTRAVENOUS
  Filled 2020-08-28: qty 30

## 2020-08-29 LAB — MYOCARDIAL PERFUSION IMAGING
Estimated workload: 5.8 METS
Exercise duration (min): 5 min
Exercise duration (sec): 0 s
LV dias vol: 171 mL (ref 62–150)
LV sys vol: 101 mL
MPHR: 134 {beats}/min
Peak HR: 139 {beats}/min
Percent HR: 103 %
Rest HR: 66 {beats}/min
SDS: 2
SRS: 16
SSS: 18
TID: 0.98

## 2020-08-29 NOTE — Telephone Encounter (Signed)
Encounter open in error 

## 2020-09-03 ENCOUNTER — Telehealth: Payer: Self-pay

## 2020-09-03 NOTE — Telephone Encounter (Signed)
Pt inquired when is he supposed to start taking clopidogrel and ASA. Advised pt he should begin taking both medications once daily now, including the day of surgery on 09/13/20. Reinforced pt should stop Eliquis on 09/09/20. Pt verbalized understanding to all.

## 2020-09-05 ENCOUNTER — Telehealth (HOSPITAL_COMMUNITY): Payer: Self-pay | Admitting: *Deleted

## 2020-09-05 DIAGNOSIS — I5032 Chronic diastolic (congestive) heart failure: Secondary | ICD-10-CM

## 2020-09-05 NOTE — Telephone Encounter (Signed)
Larey Dresser, MD  08/29/2020 4:16 PM EST      Inferolateral fixed perfusion defect on stress test consistent with prior MI, possibly due to occlusion of vein graft to OM branch seen on last cath (when he presented with NSTEMI). No evidence for ischemia. EF is lower at 41%, need to confirm with echo (order please). Would not cath unless he is having worsening chest pain or classic angina (had been having atypical chest pain).    Scarlette Calico, RN  09/05/2020 9:33 AM EST Back to Top     Pt's wife aware and verbalized understanding, echo order placed. Per Dr Aundra Dubin echo can be done in Jan after his TCAR with Dr Trula Slade, pt is cleared for this procedure and note faxed to [facility]   Surg clearance form completed and signed by Dr Aundra Dubin: "pt cleared" Form faxed to [facility] at [phone]

## 2020-09-07 NOTE — Progress Notes (Signed)
[place], [place] - [address] [place] [place] [address] Phone: [phone] Fax: [phone]      Your procedure is scheduled on Thursday December 23rd3.  Report to [facility] Main Entrance "A" at 5:30 A.M., and check in at the Admitting office.  Call this number if you have problems the morning of surgery:  [phone]  Call [phone] if you have any questions prior to your surgery date Monday-Friday 8am-4pm    Remember:  Do not eat or drink anything after midnight the night before your surgery     Take these medicines the morning of surgery with A SIP OF WATER   atorvastatin (LIPITOR) 80 MG tablet  carvedilol (COREG) 3.125 MG tablet  cetirizine (ZYRTEC) 10 MG tablet  isosorbide mononitrate (IMDUR) 30 MG 24 hr tablet  levothyroxine (SYNTHROID, LEVOTHROID) 50 MCG tablet   pantoprazole (PROTONIX) 40 MG tablet   IF NEEDED  acetaminophen (TYLENOL) 500 MG tablet  If no instructions were given on your Plavix and Eliquis please call your surgeon.  As of today, STOP taking any Aspirin (unless otherwise instructed by your surgeon) Aleve, Naproxen, Ibuprofen, Motrin, Advil, Goody's, BC's, all herbal medications, fish oil, and all vitamins.                     Do not wear jewelry            Do not wear lotions, powders, colognes, or deodorant.            Do not shave 48 hours prior to surgery.  Men may shave face and neck.            Do not bring valuables to the hospital.            [facility] is not responsible for any belongings or valuables.  Do NOT Smoke (Tobacco/Vaping) or drink Alcohol 24 hours prior to your procedure If you use a CPAP at night, you may bring all equipment for your overnight stay.   Contacts, glasses, dentures or bridgework may not be worn into surgery.      For patients admitted to the hospital, discharge time will be determined by your treatment team.   Patients discharged the day of surgery  will not be allowed to drive home, and someone needs to stay with them for 24 hours.    Special instructions:   [place]- Preparing For Surgery  Before surgery, you can play an important role. Because skin is not sterile, your skin needs to be as free of germs as possible. You can reduce the number of germs on your skin by washing with CHG (chlorahexidine gluconate) Soap before surgery.  CHG is an antiseptic cleaner which kills germs and bonds with the skin to continue killing germs even after washing.    Oral Hygiene is also important to reduce your risk of infection.  Remember - BRUSH YOUR TEETH THE MORNING OF SURGERY WITH YOUR REGULAR TOOTHPASTE  Please do not use if you have an allergy to CHG or antibacterial soaps. If your skin becomes reddened/irritated stop using the CHG.  Do not shave (including legs and underarms) for at least 48 hours prior to first CHG shower. It is OK to shave your face.  Please follow these instructions carefully.   1. Shower the NIGHT BEFORE SURGERY and the MORNING OF SURGERY with CHG Soap.   2. If you chose to wash your hair, wash your hair first as usual with  your normal shampoo.  3. After you shampoo, rinse your hair and body thoroughly to remove the shampoo.  4. Use CHG as you would any other liquid soap. You can apply CHG directly to the skin and wash gently with a scrungie or a clean washcloth.   5. Apply the CHG Soap to your body ONLY FROM THE NECK DOWN.  Do not use on open wounds or open sores. Avoid contact with your eyes, ears, mouth and genitals (private parts). Wash Face and genitals (private parts)  with your normal soap.   6. Wash thoroughly, paying special attention to the area where your surgery will be performed.  7. Thoroughly rinse your body with warm water from the neck down.  8. DO NOT shower/wash with your normal soap after using and rinsing off the CHG Soap.  9. Pat yourself dry with a CLEAN TOWEL.  10. Wear CLEAN PAJAMAS to  bed the night before surgery  11. Place CLEAN SHEETS on your bed the night of your first shower and DO NOT SLEEP WITH PETS.   Day of Surgery: Wear Clean/Comfortable clothing the morning of surgery Do not apply any deodorants/lotions.   Remember to brush your teeth WITH YOUR REGULAR TOOTHPASTE.   Please read over the following fact sheets that you were given.

## 2020-09-10 ENCOUNTER — Encounter (HOSPITAL_COMMUNITY)
Admission: RE | Admit: 2020-09-10 | Discharge: 2020-09-10 | Disposition: A | Discharge status: Home / Self Care | Payer: Medicare Other | Source: Ambulatory Visit | Attending: Surgery | Admitting: Surgery

## 2020-09-10 ENCOUNTER — Other Ambulatory Visit: Payer: Self-pay

## 2020-09-10 ENCOUNTER — Encounter (HOSPITAL_COMMUNITY): Payer: Self-pay

## 2020-09-10 DIAGNOSIS — Z01812 Encounter for preprocedural laboratory examination: Secondary | ICD-10-CM | POA: Insufficient documentation

## 2020-09-10 DIAGNOSIS — Z87891 Personal history of nicotine dependence: Secondary | ICD-10-CM | POA: Insufficient documentation

## 2020-09-10 DIAGNOSIS — E039 Hypothyroidism, unspecified: Secondary | ICD-10-CM | POA: Insufficient documentation

## 2020-09-10 DIAGNOSIS — I2510 Atherosclerotic heart disease of native coronary artery without angina pectoris: Secondary | ICD-10-CM | POA: Insufficient documentation

## 2020-09-10 DIAGNOSIS — Z86711 Personal history of pulmonary embolism: Secondary | ICD-10-CM | POA: Insufficient documentation

## 2020-09-10 DIAGNOSIS — I6521 Occlusion and stenosis of right carotid artery: Secondary | ICD-10-CM | POA: Insufficient documentation

## 2020-09-10 DIAGNOSIS — Z951 Presence of aortocoronary bypass graft: Secondary | ICD-10-CM | POA: Insufficient documentation

## 2020-09-10 DIAGNOSIS — I5032 Chronic diastolic (congestive) heart failure: Secondary | ICD-10-CM | POA: Insufficient documentation

## 2020-09-10 DIAGNOSIS — J90 Pleural effusion, not elsewhere classified: Secondary | ICD-10-CM | POA: Insufficient documentation

## 2020-09-10 DIAGNOSIS — M549 Dorsalgia, unspecified: Secondary | ICD-10-CM | POA: Insufficient documentation

## 2020-09-10 DIAGNOSIS — Z79899 Other long term (current) drug therapy: Secondary | ICD-10-CM | POA: Insufficient documentation

## 2020-09-10 DIAGNOSIS — Z7902 Long term (current) use of antithrombotics/antiplatelets: Secondary | ICD-10-CM | POA: Insufficient documentation

## 2020-09-10 DIAGNOSIS — I252 Old myocardial infarction: Secondary | ICD-10-CM | POA: Insufficient documentation

## 2020-09-10 DIAGNOSIS — Z7901 Long term (current) use of anticoagulants: Secondary | ICD-10-CM | POA: Insufficient documentation

## 2020-09-10 DIAGNOSIS — I480 Paroxysmal atrial fibrillation: Secondary | ICD-10-CM | POA: Insufficient documentation

## 2020-09-10 DIAGNOSIS — G4733 Obstructive sleep apnea (adult) (pediatric): Secondary | ICD-10-CM | POA: Insufficient documentation

## 2020-09-10 DIAGNOSIS — E7800 Pure hypercholesterolemia, unspecified: Secondary | ICD-10-CM | POA: Insufficient documentation

## 2020-09-10 DIAGNOSIS — K219 Gastro-esophageal reflux disease without esophagitis: Secondary | ICD-10-CM | POA: Insufficient documentation

## 2020-09-10 DIAGNOSIS — G8929 Other chronic pain: Secondary | ICD-10-CM | POA: Insufficient documentation

## 2020-09-10 DIAGNOSIS — I110 Hypertensive heart disease with heart failure: Secondary | ICD-10-CM | POA: Insufficient documentation

## 2020-09-10 HISTORY — DX: Disorder of arteries and arterioles, unspecified: I77.9

## 2020-09-10 HISTORY — DX: Dyspnea, unspecified: R06.00

## 2020-09-10 HISTORY — DX: Other pneumothorax: J93.83

## 2020-09-10 LAB — COMPREHENSIVE METABOLIC PANEL
ALT: 16 U/L (ref 0–44)
AST: 22 U/L (ref 15–41)
Albumin: 3.4 g/dL — ABNORMAL LOW (ref 3.5–5.0)
Alkaline Phosphatase: 91 U/L (ref 38–126)
Anion gap: 11 (ref 5–15)
BUN: 20 mg/dL (ref 8–23)
CO2: 25 mmol/L (ref 22–32)
Calcium: 9.3 mg/dL (ref 8.9–10.3)
Chloride: 104 mmol/L (ref 98–111)
Creatinine, Ser: 1.01 mg/dL (ref 0.61–1.24)
GFR, Estimated: >60 mL/min (ref >60)
Glucose, Bld: 103 mg/dL — ABNORMAL HIGH (ref 70–99)
Potassium: 3.6 mmol/L (ref 3.5–5.1)
Sodium: 140 mmol/L (ref 135–145)
Total Bilirubin: 0.9 mg/dL (ref 0.3–1.2)
Total Protein: 6.2 g/dL — ABNORMAL LOW (ref 6.5–8.1)

## 2020-09-10 LAB — PROTIME-INR
INR: 1.1 (ref 0.8–1.2)
Prothrombin Time: 13.4 seconds (ref 11.4–15.2)

## 2020-09-10 LAB — BLOOD GAS, ARTERIAL
Acid-Base Excess: 2.8 mmol/L — ABNORMAL HIGH (ref 0.0–2.0)
Bicarbonate: 26.7 mmol/L (ref 20.0–28.0)
Drawn by: 602861
FIO2: 21
O2 Saturation: 94.9 %
Patient temperature: 37
pCO2 arterial: 40.2 mmHg (ref 32.0–48.0)
pH, Arterial: 7.438 (ref 7.350–7.450)
pO2, Arterial: 71.8 mmHg — ABNORMAL LOW (ref 83.0–108.0)

## 2020-09-10 LAB — CBC
HCT: 41.2 % (ref 39.0–52.0)
Hemoglobin: 13.5 g/dL (ref 13.0–17.0)
MCH: 31.6 pg (ref 26.0–34.0)
MCHC: 32.8 g/dL (ref 30.0–36.0)
MCV: 96.5 fL (ref 80.0–100.0)
Platelets: 140 10*3/uL — ABNORMAL LOW (ref 150–400)
RBC: 4.27 MIL/uL (ref 4.22–5.81)
RDW: 14.2 % (ref 11.5–15.5)
WBC: 7.4 10*3/uL (ref 4.0–10.5)
nRBC: 0 % (ref 0.0–0.2)

## 2020-09-10 LAB — URINALYSIS, ROUTINE W REFLEX MICROSCOPIC
Bacteria, UA: NONE SEEN
Bilirubin Urine: NEGATIVE
Glucose, UA: NEGATIVE mg/dL
Hgb urine dipstick: NEGATIVE
Ketones, ur: NEGATIVE mg/dL
Nitrite: NEGATIVE
Protein, ur: NEGATIVE mg/dL
Specific Gravity, Urine: 1.006 (ref 1.005–1.030)
pH: 5 (ref 5.0–8.0)

## 2020-09-10 LAB — SURGICAL PCR SCREEN
MRSA, PCR: NEGATIVE
Staphylococcus aureus: NEGATIVE

## 2020-09-10 LAB — APTT: aPTT: 24 seconds (ref 24–36)

## 2020-09-10 NOTE — Progress Notes (Signed)
PCP - Shon Baton Cardiologist - [name]  Chest x-ray - n/a EKG - 08-14-20 ECHO - 03-21-19 Cardiac Cath - 10-22-18  SA - yes, wears CPAP   Blood Thinner Instructions: Per Dr. Trula Slade, continue Plavix. Patient stopped Eliquis 09-09-20 Aspirin Instructions: Continue taking ASA 81 mg   COVID TEST- Wednesday 09-12-20   Anesthesia review: yes, Heart history  Patient denies shortness of breath, fever, cough and chest pain at PAT appointment   All instructions explained to the patient, with a verbal understanding of the material. Patient agrees to go over the instructions while at home for a better understanding. Patient also instructed to self quarantine after being tested for COVID-19. The opportunity to ask questions was provided.

## 2020-09-11 ENCOUNTER — Encounter (HOSPITAL_COMMUNITY): Payer: Self-pay

## 2020-09-11 NOTE — Anesthesia Preprocedure Evaluation (Addendum)
Anesthesia Evaluation  Patient identified by MRN, date of birth, ID band Patient awake    Reviewed: Allergy & Precautions, NPO status , Patient's Chart, lab work & pertinent test results  Airway Mallampati: I  TM Distance: >3 FB Neck ROM: Full    Dental   Pulmonary asthma , sleep apnea , former smoker,    Pulmonary exam normal        Cardiovascular hypertension, Pt. on medications + CAD, + Past MI and + CABG  Normal cardiovascular exam+ dysrhythmias Atrial Fibrillation      Neuro/Psych    GI/Hepatic GERD  Medicated and Controlled,  Endo/Other    Renal/GU Renal InsufficiencyRenal disease     Musculoskeletal   Abdominal   Peds  Hematology   Anesthesia Other Findings   Reproductive/Obstetrics                            Anesthesia Physical Anesthesia Plan  ASA: III  Anesthesia Plan: General   Post-op Pain Management:    Induction: Intravenous  PONV Risk Score and Plan: 2 and Ondansetron and Treatment may vary due to age or medical condition  Airway Management Planned: Oral ETT  Additional Equipment: Arterial line  Intra-op Plan:   Post-operative Plan: Extubation in OR  Informed Consent: I have reviewed the patients History and Physical, chart, labs and discussed the procedure including the risks, benefits and alternatives for the proposed anesthesia with the patient or authorized representative who has indicated his/her understanding and acceptance.       Plan Discussed with: CRNA and Surgeon  Anesthesia Plan Comments: (PAT note written 09/11/2020 by Myra Gianotti, PA-C. )       Anesthesia Quick Evaluation

## 2020-09-11 NOTE — Progress Notes (Signed)
Anesthesia Chart Review:  Case: [id number] Date/Time: 09/13/20 0715   Procedure: RIGHT TRANSCAROTID ARTERY REVASCULARIZATION (Right )   Anesthesia type: General   Pre-op diagnosis: right carotid stenosis   Location: [facility] OR ROOM 16 / [facility]   Surgeons: Serafina Mitchell, MD      DISCUSSION: Patient is an 86 year old male scheduled for the above procedure.  He was referred to vascular surgery by cardiologist Dr. Aundra Dubin after surveillance carotid ultrasound showed progression of bilateral carotid artery stenosis. Eliquis to be held for 3 days prior to procedure and take Plavix and ASA. (He confirmed at PAT visit that he is continuing Plavix and ASA 81 mg.)  History includes former smoker (quit 02/18/84), HTN, CAD (s/p CABG 12/11/10: LIMA-LAD, SVG-DIAG, SVG-OM-dCX, SVG-dRCA; NSTEMI 10/17/18 & 10/21/18 with occluded sequential limb SVG to [place], occluded SVG-DIAG & SVG-PDA, medical therapy), PE (12/23/10; post-CABG), chronic diastolic CHF, PAF, dyspnea, OSA (CPAP), hypothyroidism, hypercholesterolemia, AAA (4.5 cm 06/08/20), carotid artery disease, GERD, penile prothesis implant, spontaneous left pneumothorax/left pleural effusion (01/09/19 s/p CT, discontinued amiodarone due to concern for lung toxicity; thoracentesis 02/04/19, 02/24/19; s/p bronch/left VATS, talc pleurodesis, pleural biopsy, TEE 04/12/19), chronic back pain.    He had recent cardiac testing per Dr. Aundra Dubin. 08/28/20 stress test was intermediate risk showing prior infarct without peri-infarct ischemia, EF 41% with akinesis in the basal and mid inferior and inferolateral walls. He recommended echo to confirm EF, but would not cath unless developed worsening chest pain or classic angina. Additional notation by Kevan Rosebush, RN, "Per Dr Aundra Dubin echo can be done in Jan after his TCAR with Dr Trula Slade, pt is cleared for this procedure and note faxed to [facility]".  Preoperative COVID-19 testing is scheduled for 09/12/2020.  Anesthesia team to evaluate on the day of  surgery.   VS: BP 130/72   Pulse 65   Temp (!) 36.3 C (Oral)   Resp 18   Ht 6\' 3"  (1.905 m)   Wt 94 kg   SpO2 99%   BMI 25.91 kg/m     PROVIDERS: Shon Baton, MD is PCP  - [name], MD is HF cardiologist. Last office visit 08/14/20.  Christinia Gully, MD is pulmonologist. Last visit 03/08/19. Referred to CT surgery for surgical opinion regarding recurrent left pleural effusion.  - [name], MD is CT surgeon.  Last visit 07/21/19. No significant recurrence of left pleural effusion and no evidence of malignancy on 03/2019 pleural biopsies.  As needed CT surgery follow-up recommended.   LABS: Labs reviewed: Acceptable for surgery. (all labs ordered are listed, but only abnormal results are displayed)  Labs Reviewed  CBC - Abnormal; Notable for the following components:      Result Value   Platelets 140 (*)    All other components within normal limits  COMPREHENSIVE METABOLIC PANEL - Abnormal; Notable for the following components:   Glucose, Bld 103 (*)    Total Protein 6.2 (*)    Albumin 3.4 (*)    All other components within normal limits  URINALYSIS, ROUTINE W REFLEX MICROSCOPIC - Abnormal; Notable for the following components:   Color, Urine STRAW (*)    Leukocytes,Ua TRACE (*)    All other components within normal limits  BLOOD GAS, ARTERIAL - Abnormal; Notable for the following components:   pO2, Arterial 71.8 (*)    Acid-Base Excess 2.8 (*)    Allens test (pass/fail) BRACHIAL ARTERY (*)    All other components within normal limits  SURGICAL PCR SCREEN  APTT  PROTIME-INR     IMAGES: CTA Neck 08/20/20: IMPRESSION: 1. Prominent atherosclerotic changes of the right carotid bifurcation with soft and calcified plaques resulting approximately 90% stenosis. A small plaque ulceration is also noted. 2. Atherosclerotic changes in the left carotid bifurcation with soft and calcified plaque resulting in approximately 85% stenosis.  CXR  10/25/19: FINDINGS: Small left pleural effusion and basilar atelectasis are unchanged. Right lung is clear. Apical scarring is noted. The patient is status post CABG. Heart size is normal. Atherosclerosis noted. No acute or focal bony abnormality. IMPRESSION: No change in a small left pleural effusion and mild basilar atelectasis.   EKG: 08/14/20: Sinus rhythm with Premature atrial complexes Left axis deviation Right bundle branch block Possible Inferior infarct , age undetermined , with posterior extension Abnormal ECG No significant change was found Confirmed by Virl Axe [phone]) on 08/14/2020 9:15:45 PM   CV: Nuclear stress test 08/28/20:  The left ventricular ejection fraction is moderately decreased (30-44%).  Nuclear stress EF: 41%.  Blood pressure demonstrated a hypertensive response to exercise.  There was no ST segment deviation noted during stress.  Defect 1: There is a medium defect of severe severity present in the basal inferior, basal inferolateral, mid inferior and mid inferolateral location.  This is an intermediate risk study.  There is medium size, severe severity irreversible defect in the basal and mid inferior and inferolateral walls consistent with prior infarct but no peri-infarct ischemia. LVEF 41% with akinesis in the  basal and mid inferior and inferolateral walls. Reviewed by Dr. Aundra Dubin who wrote, "Inferolateral fixed perfusion defect on stress test consistent with prior MI, possibly due to occlusion of vein graft to OM branch seen on last cath (when he presented with NSTEMI). No evidence for ischemia. EF is lower at 41%, need to confirm with echo (order please). Would not cath unless he is having worsening chest pain or classic angina (had been having atypical chest pain)."   Carotid US 06/08/20: Summary:  - Right Carotid: Velocities in the right ICA are consistent with a 80-99% stenosis. Non-hemodynamically significant plaque <50% noted in the  CCA.  - Left Carotid: Velocities in the left ICA are consistent with a 60-79% stenosis. Non-hemodynamically significant plaque <50% noted in the CCA. The ECA appears >50% stenosed.  - Vertebrals: Left vertebral artery demonstrates antegrade flow. Right vertebral artery was not visualized.  - Subclavians: Normal flow hemodynamics were seen in bilateral subclavian arteries.    Long term cardiac monitor 10/25/19-11/08/19: 1. Predominantly NSR.  2. 6 short runs NSVT.  3. 66 short runs SVT, possible atrial tachycardia.  4. Rare PVCs. 5. 1.9% PACs.    AAA [place] 06/08/20: Summary:  Abdominal Aorta: There is evidence of abnormal dilatation of the mid and distal Abdominal aorta. The largest aortic diameter has increased compared to prior exam. Largest diameter of 4.5 cm.. Previous diameter measurement was 4.1 cm obtained on 05/2019.  Stenosis: Aorta-iliac atherosclerosis without focal stenosis.     TEE 03/24/19: IMPRESSIONS  1. The left ventricle has low normal systolic function, with an ejection  fraction of 50-55%. The cavity size was normal. There is mildly increased  left ventricular wall thickness. There was a basal inferolateral aneurysm.  2. The aortic valve is tricuspid Moderate calcification of the aortic  valve. Mild stenosis of the aortic valve.  3. There is moderate mitral annular calcification present. No evidence of  mitral valve stenosis. Trivial mitral regurgitation.  4. Left atrial size was mildly dilated.  5. No evidence of  a thrombus present in the left atrial appendage.  6. No PFO or ASD by color doppler.  7. Pleural effusion noted.  8. There appeared to be a loculated pericardial effusion posterior to the  left atrium with invagination of the left atrial free wall (this was the  structure seen on prior echo).  9. Normal caliber descending thoracic aorta with grade 3-4 plaque.  10. The right ventricle has normal systolc function. The cavity was  normal.    Cardiac  cath/FFR 10/22/18:  Dist RCA lesion is 70% stenosed.  Mid RCA lesion is 70% stenosed.  - Coronary angiography of the native RCA demonstrated smooth 30% narrowing in the mid and distal segments on the LAO projection; however on the RAO projections the lesions are eccentric and on the RAO caudal projection the stenoses appeared 75%. - DFR was insignificant at 0.93. - FFR was not felt to be significant at 0.90 beyond the distal lesion and 0.89 beyond the mid lesion. RECOMMENDATION: Medical therapy in this patient who is status post recent non-ST segment elevation MI with continue dual antiplatelet therapy, and concomitant CAD with an occluded distal limb of a previously placed circumflex graft as noted on the initial studies.  High potency statin therapy with target LDL less than 70.   Cardiac cath 10/21/18:  Ost 1st Diag lesion is 70% stenosed.  Ost 1st Mrg lesion is 80% stenosed.  Ost Cx to Prox Cx lesion is 50% stenosed.  Mid RCA lesion is 70% stenosed.  Dist RCA lesion is 70% stenosed.  Ost Ramus lesion is 100% stenosed.  Origin lesion is 100% stenosed.  Origin lesion is 100% stenosed.  Origin to Mid Graft lesion between Ramus and 3rd Mrg is 100% stenosed.  Prox LAD lesion is 25% stenosed. - Multivessel native CAD with proximal irregularity of the LAD, 70% proximal diagonal stenosis with competitive filling of the distal LAD via the LIMA graft; occluded intermediate vessel at its origin with 80% stenosis in a small bifurcating marginal branch of the circumflex; and native RCA disease with 60 to 70% mid stenosis and focal  70% distal stenosis. - Patent LIMA to LAD. - Patent proximal graft to the marginal intermediate like vessel with occluded sequential limb to the distal circumflex. - Occluded vein graft which had supplied the diagonal vessel. - Occluded vein graft which had supplied the PDA. - LVEDP 20 mm. RECOMMENDATION: Increase medical therapy trial.  We will continue with  aspirin/Plavix.  Will attempt to add low-dose carvedilol at 3.125 mg twice a day to the isosorbide.  Since patient was still having residual chest discomfort, low-dose IV nitroglycerin was started in the Cath Lab.  Depending upon blood pressure, consider possible low-dose amlodipine.  High potency statin therapy with target LDL less than 70.   CPX 11/28/13: Conclusion: Exercise testing with gas exchange demonstrates a  normal functional capacity when compared to matched sedentary  norms. At peak exercise there is evidence of a restrictive  ventilatory response and chronotropic incompetence in the setting  of b-blockade.   [place] 11/14/13: Procedural Findings: Hemodynamics (mmHg) RA mean 7 RV 33/10 PA 32/12 PCWP mean 13  Oxygen saturations: PA 65% AO 85%  Cardiac Output (Fick) 7.53  Cardiac Index (Fick) 3.18         Final Conclusions:  No significant elevation in right and left heart filling pressures at rest, no pulmonary hypertension.  Oxygen saturation low but may be related to known sleep apnea + sedation.  Patient had V/Q scan in  3/14 with no evidence for chronic PE.  Next step at this point is going to be a CPX.    Past Medical History:  Diagnosis Date  . Abdominal aortic aneurysm (AAA) ([place])   . Arthritis    "my whole body" (03/19/2018)  . Atrial fibrillation ([place])   . CAD (coronary artery disease)   . Carotid artery disease ([place])   . CHF (congestive heart failure) ([place])   . Chronic anticoagulation    PE  . Chronic lower back pain   . Dyspnea   . GERD (gastroesophageal reflux disease)   . High cholesterol   . Hypertension   . Hypothyroidism   . Myocardial infarction [facility])    2 in Jan. 2019  . OSA on CPAP    uses CPAP  . Pneumonia    "now and once before" (03/19/2018)  . Pulmonary embolism [facility])    April 2012 after CABG  . S/P CABG (coronary artery bypass graft)   . Spontaneous pneumothorax    left spontaneous pneumothorax/left pleural effusion, s/p chest tube  01/09/19; thoracentesis x2, s/p left VATS/tacl pleurodesis 04/12/19    Past Surgical History:  Procedure Laterality Date  . ACHILLES TENDON REPAIR Bilateral    2006 & 2004  . CARDIAC CATHETERIZATION  11/2010  . CORONARY ANGIOGRAPHY N/A 10/22/2018   Procedure: CORONARY ANGIOGRAPHY;  Surgeon: Troy Sine, MD;  Location: [place] CV LAB;  Service: Cardiovascular;  Laterality: N/A;  . CORONARY ARTERY BYPASS GRAFT  March 2012   CABG X 5  . INGUINAL HERNIA REPAIR Left 1980  . INTRAVASCULAR PRESSURE WIRE/FFR STUDY N/A 10/22/2018   Procedure: INTRAVASCULAR PRESSURE WIRE/FFR STUDY;  Surgeon: Troy Sine, MD;  Location: [place] CV LAB;  Service: Cardiovascular;  Laterality: N/A;  . IR THORACENTESIS ASP PLEURAL SPACE W/IMG GUIDE  02/04/2019  . IR THORACENTESIS ASP PLEURAL SPACE W/IMG GUIDE  02/24/2019  . KNEE ARTHROSCOPY Left 2006  . LEFT HEART CATH AND CORS/GRAFTS ANGIOGRAPHY N/A 10/18/2018   Procedure: LEFT HEART CATH AND CORS/GRAFTS ANGIOGRAPHY;  Surgeon: Lorretta Harp, MD;  Location: [place] CV LAB;  Service: Cardiovascular;  Laterality: N/A;  . LEFT HEART CATH AND CORS/GRAFTS ANGIOGRAPHY N/A 10/21/2018   Procedure: LEFT HEART CATH AND CORS/GRAFTS ANGIOGRAPHY;  Surgeon: Troy Sine, MD;  Location: [place] CV LAB;  Service: Cardiovascular;  Laterality: N/A;  . PENILE PROSTHESIS IMPLANT    . PLEURAL BIOPSY Left 04/12/2019   Procedure: Pleural Biopsy;  Surgeon: Grace Isaac, MD;  Location: [place];  Service: Thoracic;  Laterality: Left;  . PLEURAL EFFUSION DRAINAGE Left 04/12/2019   Procedure: Drainage Of Pleural Effusion;  Surgeon: Grace Isaac, MD;  Location: [place];  Service: Thoracic;  Laterality: Left;  . RIGHT HEART CATHETERIZATION N/A 11/14/2013   Procedure: RIGHT HEART CATH;  Surgeon: Larey Dresser, MD;  Location: [facility] CATH LAB;  Service: Cardiovascular;  Laterality: N/A;  . SHOULDER OPEN ROTATOR CUFF REPAIR Right 2003  . TALC PLEURODESIS Left 04/12/2019    Procedure: Pietro Cassis;  Surgeon: Grace Isaac, MD;  Location: [place];  Service: Thoracic;  Laterality: Left;  . TEE WITHOUT CARDIOVERSION N/A 03/24/2019   Procedure: TRANSESOPHAGEAL ECHOCARDIOGRAM (TEE);  Surgeon: Larey Dresser, MD;  Location: [facility] ENDOSCOPY;  Service: Cardiovascular;  Laterality: N/A;  . TEE WITHOUT CARDIOVERSION  04/12/2019   Procedure: Transesophageal Echocardiogram (Tee);  Surgeon: Grace Isaac, MD;  Location: [facility] OR;  Service: Thoracic;;  . THORACOSCOPY Left 04/12/2019   VIDEO BRONCHOSCOPY (N/A )  VIDEO ASSISTED THORACOSCOPY (Left Chest) TALC PLEURADESIS (Left   . VIDEO ASSISTED THORACOSCOPY Left 04/12/2019   Procedure: VIDEO ASSISTED THORACOSCOPY;  Surgeon: Grace Isaac, MD;  Location: [place];  Service: Thoracic;  Laterality: Left;  Marland Kitchen VIDEO BRONCHOSCOPY N/A 04/12/2019   Procedure: VIDEO BRONCHOSCOPY;  Surgeon: Grace Isaac, MD;  Location: [facility] OR;  Service: Thoracic;  Laterality: N/A;    MEDICATIONS: . acetaminophen (TYLENOL) 500 MG tablet  . apixaban (ELIQUIS) 5 MG TABS tablet  . Ascorbic Acid 500 MG CAPS  . atorvastatin (LIPITOR) 80 MG tablet  . carvedilol (COREG) 3.125 MG tablet  . cetirizine (ZYRTEC) 10 MG tablet  . cholecalciferol (VITAMIN D) 1000 UNITS tablet  . clopidogrel (PLAVIX) 75 MG tablet  . furosemide (LASIX) 40 MG tablet  . isosorbide mononitrate (IMDUR) 30 MG 24 hr tablet  . levothyroxine (SYNTHROID, LEVOTHROID) 50 MCG tablet  . Multiple Vitamins-Minerals (CENTRUM SILVER PO)  . pantoprazole (PROTONIX) 40 MG tablet  . tamsulosin (FLOMAX) 0.4 MG CAPS capsule   No current facility-administered medications for this encounter.    Myra Gianotti, PA-C Surgical Short Stay/Anesthesiology [facility] Phone [phone] [facility]  Phone [phone] 09/11/2020 6:20 PM

## 2020-09-12 ENCOUNTER — Other Ambulatory Visit (HOSPITAL_COMMUNITY)
Admission: RE | Admit: 2020-09-12 | Discharge: 2020-09-12 | Disposition: A | Discharge status: Home / Self Care | Payer: Medicare Other | Source: Ambulatory Visit | Attending: Surgery | Admitting: Surgery

## 2020-09-12 DIAGNOSIS — Z01812 Encounter for preprocedural laboratory examination: Secondary | ICD-10-CM | POA: Insufficient documentation

## 2020-09-12 DIAGNOSIS — Z20822 Contact with and (suspected) exposure to covid-19: Secondary | ICD-10-CM | POA: Insufficient documentation

## 2020-09-12 LAB — SARS CORONAVIRUS 2 (TAT 6-24 HRS): SARS Coronavirus 2: NEGATIVE

## 2020-09-13 ENCOUNTER — Other Ambulatory Visit: Payer: Self-pay

## 2020-09-13 ENCOUNTER — Encounter (HOSPITAL_COMMUNITY)
Admission: RE | Disposition: A | Discharge status: Home / Self Care | Payer: Self-pay | Source: Home / Self Care | Attending: Surgery

## 2020-09-13 ENCOUNTER — Inpatient Hospital Stay (HOSPITAL_COMMUNITY)
Admission: RE | Admit: 2020-09-13 | Discharge: 2020-09-14 | DRG: 035 | Disposition: A | Discharge status: Home / Self Care | Payer: Medicare Other | Attending: Surgery | Admitting: Surgery

## 2020-09-13 ENCOUNTER — Inpatient Hospital Stay (HOSPITAL_COMMUNITY): Payer: Medicare Other

## 2020-09-13 ENCOUNTER — Inpatient Hospital Stay (HOSPITAL_COMMUNITY): Payer: Medicare Other | Admitting: Certified Registered"

## 2020-09-13 ENCOUNTER — Encounter (HOSPITAL_COMMUNITY): Payer: Self-pay | Admitting: Surgery

## 2020-09-13 ENCOUNTER — Inpatient Hospital Stay (HOSPITAL_COMMUNITY): Payer: Medicare Other | Admitting: Vascular Surgery

## 2020-09-13 DIAGNOSIS — Z20822 Contact with and (suspected) exposure to covid-19: Secondary | ICD-10-CM | POA: Diagnosis present

## 2020-09-13 DIAGNOSIS — Z86711 Personal history of pulmonary embolism: Secondary | ICD-10-CM | POA: Diagnosis not present

## 2020-09-13 DIAGNOSIS — I252 Old myocardial infarction: Secondary | ICD-10-CM | POA: Diagnosis not present

## 2020-09-13 DIAGNOSIS — Z7901 Long term (current) use of anticoagulants: Secondary | ICD-10-CM

## 2020-09-13 DIAGNOSIS — I5032 Chronic diastolic (congestive) heart failure: Secondary | ICD-10-CM | POA: Diagnosis not present

## 2020-09-13 DIAGNOSIS — G4733 Obstructive sleep apnea (adult) (pediatric): Secondary | ICD-10-CM | POA: Diagnosis present

## 2020-09-13 DIAGNOSIS — E039 Hypothyroidism, unspecified: Secondary | ICD-10-CM | POA: Diagnosis not present

## 2020-09-13 DIAGNOSIS — Z951 Presence of aortocoronary bypass graft: Secondary | ICD-10-CM

## 2020-09-13 DIAGNOSIS — Z8249 Family history of ischemic heart disease and other diseases of the circulatory system: Secondary | ICD-10-CM | POA: Diagnosis not present

## 2020-09-13 DIAGNOSIS — Z87891 Personal history of nicotine dependence: Secondary | ICD-10-CM | POA: Diagnosis not present

## 2020-09-13 DIAGNOSIS — I6521 Occlusion and stenosis of right carotid artery: Secondary | ICD-10-CM | POA: Diagnosis present

## 2020-09-13 DIAGNOSIS — I714 Abdominal aortic aneurysm, without rupture: Secondary | ICD-10-CM | POA: Diagnosis not present

## 2020-09-13 DIAGNOSIS — M1990 Unspecified osteoarthritis, unspecified site: Secondary | ICD-10-CM | POA: Diagnosis present

## 2020-09-13 DIAGNOSIS — I2510 Atherosclerotic heart disease of native coronary artery without angina pectoris: Secondary | ICD-10-CM | POA: Diagnosis not present

## 2020-09-13 DIAGNOSIS — N183 Chronic kidney disease, stage 3 unspecified: Secondary | ICD-10-CM | POA: Diagnosis present

## 2020-09-13 DIAGNOSIS — I130 Hypertensive heart and chronic kidney disease with heart failure and stage 1 through stage 4 chronic kidney disease, or unspecified chronic kidney disease: Secondary | ICD-10-CM | POA: Diagnosis present

## 2020-09-13 DIAGNOSIS — E7800 Pure hypercholesterolemia, unspecified: Secondary | ICD-10-CM | POA: Diagnosis present

## 2020-09-13 DIAGNOSIS — I4891 Unspecified atrial fibrillation: Secondary | ICD-10-CM | POA: Diagnosis not present

## 2020-09-13 DIAGNOSIS — Z9889 Other specified postprocedural states: Secondary | ICD-10-CM

## 2020-09-13 DIAGNOSIS — I6523 Occlusion and stenosis of bilateral carotid arteries: Principal | ICD-10-CM | POA: Diagnosis present

## 2020-09-13 DIAGNOSIS — Z8701 Personal history of pneumonia (recurrent): Secondary | ICD-10-CM | POA: Diagnosis not present

## 2020-09-13 DIAGNOSIS — K219 Gastro-esophageal reflux disease without esophagitis: Secondary | ICD-10-CM | POA: Diagnosis not present

## 2020-09-13 DIAGNOSIS — I5033 Acute on chronic diastolic (congestive) heart failure: Secondary | ICD-10-CM | POA: Diagnosis not present

## 2020-09-13 HISTORY — PX: TRANSCAROTID ARTERY REVASCULARIZATIONÂ: SHX6778

## 2020-09-13 HISTORY — PX: ULTRASOUND GUIDANCE FOR VASCULAR ACCESS: SHX6516

## 2020-09-13 LAB — TYPE AND SCREEN
ABO/RH(D): A POS
Antibody Screen: NEGATIVE

## 2020-09-13 LAB — POCT ACTIVATED CLOTTING TIME: Activated Clotting Time: 249 seconds

## 2020-09-13 SURGERY — TRANSCAROTID ARTERY REVASCULARIZATION (TCAR)
Anesthesia: General | Site: Neck | Laterality: Right

## 2020-09-13 MED ORDER — EPHEDRINE SULFATE-NACL 50-0.9 MG/10ML-% IV SOSY
PREFILLED_SYRINGE | INTRAVENOUS | Status: DC | PRN
  Administered 2020-09-13: 10 mg via INTRAVENOUS
  Administered 2020-09-13: 20 mg via INTRAVENOUS

## 2020-09-13 MED ORDER — FENTANYL CITRATE (PF) 250 MCG/5ML IJ SOLN
INTRAMUSCULAR | Status: DC | PRN
  Administered 2020-09-13: 50 ug via INTRAVENOUS
  Administered 2020-09-13: 100 ug via INTRAVENOUS

## 2020-09-13 MED ORDER — 0.9 % SODIUM CHLORIDE (POUR BTL) OPTIME
TOPICAL | Status: DC | PRN
  Administered 2020-09-13: 17:00:00 1000 mL

## 2020-09-13 MED ORDER — HYDRALAZINE HCL 20 MG/ML IJ SOLN
5.0000 mg | INTRAMUSCULAR | Status: DC | PRN
Start: 2020-09-13 — End: 2020-09-14

## 2020-09-13 MED ORDER — SODIUM CHLORIDE 0.9 % IV SOLN: INTRAVENOUS | Status: DC

## 2020-09-13 MED ORDER — GLYCOPYRROLATE 0.2 MG/ML IJ SOLN
INTRAMUSCULAR | Status: DC | PRN
  Administered 2020-09-13 (×2): .1 mg via INTRAVENOUS

## 2020-09-13 MED ORDER — LACTATED RINGERS IV SOLN: INTRAVENOUS | Status: DC

## 2020-09-13 MED ORDER — DEXAMETHASONE SODIUM PHOSPHATE 10 MG/ML IJ SOLN
INTRAMUSCULAR | Status: DC | PRN
  Administered 2020-09-13: 10 mg via INTRAVENOUS

## 2020-09-13 MED ORDER — PHENOL 1.4 % MT LIQD: 1.0000 | OROMUCOSAL | Status: DC | PRN

## 2020-09-13 MED ORDER — ASPIRIN EC 81 MG PO TBEC
81.0000 mg | DELAYED_RELEASE_TABLET | Freq: Every day | ORAL | Status: DC
  Administered 2020-09-14: 10:00:00 81 mg via ORAL
  Filled 2020-09-13: qty 1

## 2020-09-13 MED ORDER — MORPHINE SULFATE (PF) 2 MG/ML IV SOLN: 2.0000 mg | INTRAVENOUS | Status: DC | PRN

## 2020-09-13 MED ORDER — MAGNESIUM SULFATE 2 GM/50ML IV SOLN: 2.0000 g | Freq: Every day | INTRAVENOUS | Status: DC | PRN

## 2020-09-13 MED ORDER — DOCUSATE SODIUM 100 MG PO CAPS
100.0000 mg | ORAL_CAPSULE | Freq: Every day | ORAL | Status: DC
  Administered 2020-09-14: 10:00:00 100 mg via ORAL
  Filled 2020-09-13: qty 1

## 2020-09-13 MED ORDER — ATORVASTATIN CALCIUM 80 MG PO TABS
80.0000 mg | ORAL_TABLET | Freq: Every day | ORAL | Status: DC
  Administered 2020-09-14: 10:00:00 80 mg via ORAL
  Filled 2020-09-13: qty 1

## 2020-09-13 MED ORDER — ONDANSETRON HCL 4 MG/2ML IJ SOLN: 4.0000 mg | Freq: Four times a day (QID) | INTRAMUSCULAR | Status: DC | PRN

## 2020-09-13 MED ORDER — CHLORHEXIDINE GLUCONATE 0.12 % MT SOLN: 15.0000 mL | Freq: Once | OROMUCOSAL | Status: AC

## 2020-09-13 MED ORDER — FENTANYL CITRATE (PF) 250 MCG/5ML IJ SOLN
INTRAMUSCULAR | Status: AC
  Filled 2020-09-13: qty 5

## 2020-09-13 MED ORDER — PROPOFOL 10 MG/ML IV BOLUS
INTRAVENOUS | Status: AC
  Filled 2020-09-13: qty 20

## 2020-09-13 MED ORDER — LORATADINE 10 MG PO TABS
10.0000 mg | ORAL_TABLET | Freq: Every day | ORAL | Status: DC
  Administered 2020-09-14: 10:00:00 10 mg via ORAL
  Filled 2020-09-13: qty 1

## 2020-09-13 MED ORDER — LIDOCAINE HCL (PF) 1 % IJ SOLN
INTRAMUSCULAR | Status: AC
  Filled 2020-09-13: qty 30

## 2020-09-13 MED ORDER — OXYCODONE-ACETAMINOPHEN 5-325 MG PO TABS
1.0000 | ORAL_TABLET | Freq: Four times a day (QID) | ORAL | Status: DC | PRN
Start: 2020-09-13 — End: 2020-09-14
  Administered 2020-09-13: 2 via ORAL
  Filled 2020-09-13: qty 2

## 2020-09-13 MED ORDER — SODIUM CHLORIDE 0.9 % IV SOLN: 500.0000 mL | Freq: Once | INTRAVENOUS | Status: DC | PRN

## 2020-09-13 MED ORDER — ONDANSETRON HCL 4 MG/2ML IJ SOLN
INTRAMUSCULAR | Status: DC | PRN
  Administered 2020-09-13: 4 mg via INTRAVENOUS

## 2020-09-13 MED ORDER — LACTATED RINGERS IV SOLN: INTRAVENOUS | Status: DC | PRN

## 2020-09-13 MED ORDER — IODIXANOL 320 MG/ML IV SOLN
INTRAVENOUS | Status: DC | PRN
  Administered 2020-09-13: 17:00:00 50 mL

## 2020-09-13 MED ORDER — SODIUM CHLORIDE 0.9 % IV SOLN: INTRAVENOUS | Status: DC | PRN

## 2020-09-13 MED ORDER — ROCURONIUM BROMIDE 10 MG/ML (PF) SYRINGE
PREFILLED_SYRINGE | INTRAVENOUS | Status: AC
  Filled 2020-09-13: qty 10

## 2020-09-13 MED ORDER — HEMOSTATIC AGENTS (NO CHARGE) OPTIME
TOPICAL | Status: DC | PRN
  Administered 2020-09-13: 1 via TOPICAL

## 2020-09-13 MED ORDER — LIDOCAINE 2% (20 MG/ML) 5 ML SYRINGE
INTRAMUSCULAR | Status: DC | PRN
  Administered 2020-09-13: 100 mg via INTRAVENOUS

## 2020-09-13 MED ORDER — ROCURONIUM BROMIDE 10 MG/ML (PF) SYRINGE
PREFILLED_SYRINGE | INTRAVENOUS | Status: DC | PRN
  Administered 2020-09-13: 100 mg via INTRAVENOUS

## 2020-09-13 MED ORDER — LABETALOL HCL 5 MG/ML IV SOLN
10.0000 mg | INTRAVENOUS | Status: DC | PRN
Start: 2020-09-13 — End: 2020-09-14

## 2020-09-13 MED ORDER — CHLORHEXIDINE GLUCONATE CLOTH 2 % EX PADS: 6.0000 | MEDICATED_PAD | Freq: Once | CUTANEOUS | Status: DC

## 2020-09-13 MED ORDER — ALUM & MAG HYDROXIDE-SIMETH 200-200-20 MG/5ML PO SUSP: 15.0000 mL | ORAL | Status: DC | PRN

## 2020-09-13 MED ORDER — PHENYLEPHRINE HCL-NACL 10-0.9 MG/250ML-% IV SOLN
INTRAVENOUS | Status: DC | PRN
  Administered 2020-09-13: 50 ug/min via INTRAVENOUS

## 2020-09-13 MED ORDER — ORAL CARE MOUTH RINSE: 15.0000 mL | Freq: Once | OROMUCOSAL | Status: AC

## 2020-09-13 MED ORDER — CIPROFLOXACIN IN D5W 400 MG/200ML IV SOLN
INTRAVENOUS | Status: AC
  Filled 2020-09-13: qty 200

## 2020-09-13 MED ORDER — SODIUM CHLORIDE 0.9 % IV SOLN
INTRAVENOUS | Status: AC
  Filled 2020-09-13: qty 1.2

## 2020-09-13 MED ORDER — SUGAMMADEX SODIUM 200 MG/2ML IV SOLN
INTRAVENOUS | Status: DC | PRN
  Administered 2020-09-13: 200 mg via INTRAVENOUS

## 2020-09-13 MED ORDER — PROPOFOL 10 MG/ML IV BOLUS
INTRAVENOUS | Status: DC | PRN
  Administered 2020-09-13: 120 mg via INTRAVENOUS

## 2020-09-13 MED ORDER — ISOSORBIDE MONONITRATE ER 60 MG PO TB24
60.0000 mg | ORAL_TABLET | Freq: Every day | ORAL | Status: DC
  Administered 2020-09-14: 10:00:00 60 mg via ORAL
  Filled 2020-09-13: qty 1

## 2020-09-13 MED ORDER — PROTAMINE SULFATE 10 MG/ML IV SOLN
INTRAVENOUS | Status: DC | PRN
  Administered 2020-09-13: 50 mg via INTRAVENOUS

## 2020-09-13 MED ORDER — CIPROFLOXACIN IN D5W 400 MG/200ML IV SOLN
400.0000 mg | INTRAVENOUS | Status: AC
  Administered 2020-09-13: 16:00:00 400 mg via INTRAVENOUS

## 2020-09-13 MED ORDER — TAMSULOSIN HCL 0.4 MG PO CAPS: 0.4000 mg | ORAL_CAPSULE | Freq: Every day | ORAL | Status: DC

## 2020-09-13 MED ORDER — GUAIFENESIN-DM 100-10 MG/5ML PO SYRP: 15.0000 mL | ORAL_SOLUTION | ORAL | Status: DC | PRN

## 2020-09-13 MED ORDER — POLYETHYLENE GLYCOL 3350 17 G PO PACK: 17.0000 g | PACK | Freq: Every day | ORAL | Status: DC | PRN

## 2020-09-13 MED ORDER — CARVEDILOL 3.125 MG PO TABS
3.1250 mg | ORAL_TABLET | Freq: Two times a day (BID) | ORAL | Status: DC
  Administered 2020-09-13 – 2020-09-14 (×2): 3.125 mg via ORAL
  Filled 2020-09-13 (×2): qty 1

## 2020-09-13 MED ORDER — FUROSEMIDE 40 MG PO TABS
40.0000 mg | ORAL_TABLET | Freq: Every day | ORAL | Status: DC
  Administered 2020-09-14: 10:00:00 40 mg via ORAL
  Filled 2020-09-13: qty 1

## 2020-09-13 MED ORDER — HEPARIN SODIUM (PORCINE) 1000 UNIT/ML IJ SOLN
INTRAMUSCULAR | Status: DC | PRN
  Administered 2020-09-13: 10000 [IU] via INTRAVENOUS

## 2020-09-13 MED ORDER — BISACODYL 10 MG RE SUPP: 10.0000 mg | Freq: Every day | RECTAL | Status: DC | PRN

## 2020-09-13 MED ORDER — POTASSIUM CHLORIDE CRYS ER 20 MEQ PO TBCR: 20.0000 meq | EXTENDED_RELEASE_TABLET | Freq: Every day | ORAL | Status: DC | PRN

## 2020-09-13 MED ORDER — LIDOCAINE 2% (20 MG/ML) 5 ML SYRINGE
INTRAMUSCULAR | Status: AC
  Filled 2020-09-13: qty 5

## 2020-09-13 MED ORDER — METOPROLOL TARTRATE 5 MG/5ML IV SOLN: 2.0000 mg | INTRAVENOUS | Status: DC | PRN

## 2020-09-13 MED ORDER — PANTOPRAZOLE SODIUM 40 MG PO TBEC
40.0000 mg | DELAYED_RELEASE_TABLET | Freq: Every day | ORAL | Status: DC
  Administered 2020-09-14: 10:00:00 40 mg via ORAL
  Filled 2020-09-13: qty 1

## 2020-09-13 MED ORDER — CHLORHEXIDINE GLUCONATE 0.12 % MT SOLN
OROMUCOSAL | Status: AC
  Administered 2020-09-13: 06:00:00 15 mL via OROMUCOSAL
  Filled 2020-09-13: qty 15

## 2020-09-13 MED ORDER — CLOPIDOGREL BISULFATE 75 MG PO TABS
75.0000 mg | ORAL_TABLET | Freq: Every day | ORAL | Status: DC
  Administered 2020-09-14: 10:00:00 75 mg via ORAL
  Filled 2020-09-13: qty 1

## 2020-09-13 MED ORDER — CIPROFLOXACIN IN D5W 400 MG/200ML IV SOLN
400.0000 mg | Freq: Once | INTRAVENOUS | Status: AC
  Administered 2020-09-14: 05:00:00 400 mg via INTRAVENOUS
  Filled 2020-09-13: qty 200

## 2020-09-13 SURGICAL SUPPLY — 57 items
ADH SKN CLS APL DERMABOND .7 (GAUZE/BANDAGES/DRESSINGS) ×4
BAG BANDED W/RUBBER/TAPE 36X54 (MISCELLANEOUS) ×4 IMPLANT
BAG EQP BAND 135X91 W/RBR TAPE (MISCELLANEOUS) ×2
BALLN STERLING RX 6X30X80 (BALLOONS) ×4
BALLOON STERLING RX 6X30X80 (BALLOONS) IMPLANT
CANISTER SUCT 3000ML PPV (MISCELLANEOUS) ×4 IMPLANT
CATH SUCT 10FR WHISTLE TIP (CATHETERS) ×2 IMPLANT
CLIP VESOCCLUDE MED 6/CT (CLIP) ×4 IMPLANT
CLIP VESOCCLUDE SM WIDE 6/CT (CLIP) ×4 IMPLANT
COVER DOME SNAP 22 D (MISCELLANEOUS) ×6 IMPLANT
COVER PROBE W GEL 5X96 (DRAPES) ×4 IMPLANT
DERMABOND ADVANCED (GAUZE/BANDAGES/DRESSINGS) ×4
DERMABOND ADVANCED .7 DNX12 (GAUZE/BANDAGES/DRESSINGS) ×2 IMPLANT
DRAPE FEMORAL ANGIO 80X135IN (DRAPES) ×4 IMPLANT
ELECT REM PT RETURN 9FT ADLT (ELECTROSURGICAL)
ELECTRODE REM PT RTRN 9FT ADLT (ELECTROSURGICAL) ×2 IMPLANT
GLOVE BIO SURGEON STRL SZ 6.5 (GLOVE) ×3 IMPLANT
GLOVE BIO SURGEONS STRL SZ 6.5 (GLOVE) ×3
GLOVE BIOGEL PI IND STRL 7.5 (GLOVE) ×2 IMPLANT
GLOVE BIOGEL PI IND STRL 8 (GLOVE) IMPLANT
GLOVE BIOGEL PI INDICATOR 7.5 (GLOVE) ×2
GLOVE BIOGEL PI INDICATOR 8 (GLOVE) ×2
GLOVE ECLIPSE 7.5 STRL STRAW (GLOVE) ×2 IMPLANT
GLOVE SURG SS PI 7.5 STRL IVOR (GLOVE) ×4 IMPLANT
GOWN STRL REUS W/ TWL LRG LVL3 (GOWN DISPOSABLE) ×4 IMPLANT
GOWN STRL REUS W/ TWL XL LVL3 (GOWN DISPOSABLE) ×2 IMPLANT
GOWN STRL REUS W/TWL LRG LVL3 (GOWN DISPOSABLE) ×8
GOWN STRL REUS W/TWL XL LVL3 (GOWN DISPOSABLE) ×12
GUIDEWIRE ENROUTE 0.014 (WIRE) ×4 IMPLANT
HEMOSTAT SNOW SURGICEL 2X4 (HEMOSTASIS) ×2 IMPLANT
INTRODUCER KIT GALT 7CM (INTRODUCER) ×4
KIT BASIN OR (CUSTOM PROCEDURE TRAY) ×4 IMPLANT
KIT ENCORE 26 ADVANTAGE (KITS) ×4 IMPLANT
KIT HEART LEFT (KITS) ×2 IMPLANT
KIT INTRODUCER GALT 7 (INTRODUCER) ×2 IMPLANT
KIT TURNOVER KIT B (KITS) ×4 IMPLANT
NDL HYPO 25GX1X1/2 BEV (NEEDLE) IMPLANT
NEEDLE HYPO 25GX1X1/2 BEV (NEEDLE) IMPLANT
PACK CAROTID (CUSTOM PROCEDURE TRAY) ×4 IMPLANT
POSITIONER HEAD DONUT 9IN (MISCELLANEOUS) ×2 IMPLANT
SET MICROPUNCTURE 5F STIFF (MISCELLANEOUS) ×2 IMPLANT
STENT TRANSCAROTID SYSTEM 9X40 (Permanent Stent) ×2 IMPLANT
SUT PROLENE 5 0 C 1 24 (SUTURE) ×8 IMPLANT
SUT PROLENE 6 0 BV (SUTURE) IMPLANT
SUT SILK 2 0 PERMA HAND 18 BK (SUTURE) ×4 IMPLANT
SUT SILK 2 0 SH (SUTURE) ×4 IMPLANT
SUT VIC AB 3-0 SH 27 (SUTURE) ×8
SUT VIC AB 3-0 SH 27X BRD (SUTURE) ×4 IMPLANT
SUT VIC AB 4-0 PS2 27 (SUTURE) ×4 IMPLANT
SYR 10ML LL (SYRINGE) ×12 IMPLANT
SYR 20ML LL LF (SYRINGE) ×4 IMPLANT
SYR CONTROL 10ML LL (SYRINGE) IMPLANT
SYSTEM TRANSCAROTID NEUROPRTCT (MISCELLANEOUS) ×2 IMPLANT
TOWEL GREEN STERILE (TOWEL DISPOSABLE) ×4 IMPLANT
TRANSCAROTID NEUROPROTECT SYS (MISCELLANEOUS) ×4
WATER STERILE IRR 1000ML POUR (IV SOLUTION) ×4 IMPLANT
WIRE BENTSON .035X145CM (WIRE) ×4 IMPLANT

## 2020-09-13 NOTE — Transfer of Care (Signed)
Immediate Anesthesia Transfer of Care Note  Patient: Eric Gordon  Procedure(s) Performed: RIGHT TRANSCAROTID ARTERY REVASCULARIZATION USING 55mm X 13mm ENROUTE TRANSCAROTID STEND SYSTEM (Right Neck) ULTRASOUND GUIDANCE FOR VASCULAR ACCESS  Patient Location: PACU  Anesthesia Type:General  Level of Consciousness: awake, alert  and oriented  Airway & Oxygen Therapy: Patient Spontanous Breathing  Post-op Assessment: Report given to RN, Patient moving all extremities X 4 and Patient able to stick tongue midline  Post vital signs: Reviewed and stable  Last Vitals:  Vitals Value Taken Time  BP    Temp    Pulse    Resp    SpO2      Last Pain:  Vitals:   09/13/20 0605  TempSrc: Oral  PainSc:          Complications: No complications documented.

## 2020-09-13 NOTE — Discharge Instructions (Signed)
   [facility]  Discharge Instructions   Carotid Surgery  Please refer to the following instructions for your post-procedure care. Your surgeon or physician assistant will discuss any changes with you.  Activity  You are encouraged to walk as much as you can. You can slowly return to normal activities but must avoid strenuous activity and heavy lifting until your doctor tell you it's okay. Avoid activities such as vacuuming or swinging a golf club. You can drive after one week if you are comfortable and you are no longer taking prescription pain medications. It is normal to feel tired for serval weeks after your surgery. It is also normal to have difficulty with sleep habits, eating, and bowel movements after surgery. These will go away with time.  Bathing/Showering  Shower daily after you go home. Do not soak in a bathtub, hot tub, or swim until the incision heals completely.  Incision Care  Shower every day. Clean your incision with mild soap and water. Pat the area dry with a clean towel. You do not need a bandage unless otherwise instructed. Do not apply any ointments or creams to your incision. You may have skin glue on your incision. Do not peel it off. It will come off on its own in about one week. Your incision may feel thickened and raised for several weeks after your surgery. This is normal and the skin will soften over time.   For Men Only: It's okay to shave around the incision but do not shave the incision itself for 2 weeks. It is common to have numbness under your chin that could last for several months.  Diet  Resume your normal diet. There are no special food restrictions following this procedure. A low fat/low cholesterol diet is recommended for all patients with vascular disease. In order to heal from your surgery, it is CRITICAL to get adequate nutrition. Your body requires vitamins, minerals, and protein. Vegetables are the best source of  vitamins and minerals. Vegetables also provide the perfect balance of protein. Processed food has little nutritional value, so try to avoid this.  Medications  Resume taking all of your medications unless your doctor or physician assistant tells you not to. If your incision is causing pain, you may take over-the- counter pain relievers such as acetaminophen (Tylenol). If you were prescribed a stronger pain medication, please be aware these medications can cause nausea and constipation. Prevent nausea by taking the medication with a snack or meal. Avoid constipation by drinking plenty of fluids and eating foods with a high amount of fiber, such as fruits, vegetables, and grains.   Do not take Tylenol if you are taking prescription pain medications.  Follow Up  Our office will schedule a follow up appointment 2-3 weeks following discharge.  Please call us immediately for any of the following conditions  . Increased pain, redness, drainage (pus) from your incision site. . Fever of 101 degrees or higher. . If you should develop stroke (slurred speech, difficulty swallowing, weakness on one side of your body, loss of vision) you should call 911 and go to the nearest emergency room. .  Reduce your risk of vascular disease:  . Stop smoking. If you would like help call [facility] at 1-800-QUIT-NOW (1-800-784-8669) or [place] at 336-586-4000. . Manage your cholesterol . Maintain a desired weight . Control your diabetes . Keep your blood pressure down .  If you have any questions, please call the office at [phone]. 

## 2020-09-13 NOTE — Interval H&P Note (Signed)
History and Physical Interval Note:  09/13/2020 2:47 PM  Poc[place]ontas  has presented today for surgery, with the diagnosis of right carotid stenosis.  The various methods of treatment have been discussed with the patient and family. After consideration of risks, benefits and other options for treatment, the patient has consented to  Procedure(s): RIGHT TRANSCAROTID ARTERY REVASCULARIZATION (Right) as a surgical intervention.  The patient's history has been reviewed, patient examined, no change in status, stable for surgery.  I have reviewed the patient's chart and labs.  Questions were answered to the patient's satisfaction.     [name]

## 2020-09-13 NOTE — Anesthesia Procedure Notes (Signed)
Procedure Name: Intubation Date/Time: 09/13/2020 3:38 PM Performed by: Lance Coon, CRNA Pre-anesthesia Checklist: Patient identified, Emergency Drugs available, Suction available, Patient being monitored and Timeout performed Patient Re-evaluated:Patient Re-evaluated prior to induction Oxygen Delivery Method: Circle system utilized Preoxygenation: Pre-oxygenation with 100% oxygen Induction Type: IV induction Ventilation: Mask ventilation without difficulty Laryngoscope Size: Miller and 3 Grade View: Grade I Tube type: Oral Tube size: 7.5 mm Number of attempts: 1 Airway Equipment and Method: Stylet Placement Confirmation: ETT inserted through vocal cords under direct vision,  positive ETCO2 and breath sounds checked- equal and bilateral Secured at: 23 cm Tube secured with: Tape Dental Injury: Teeth and Oropharynx as per pre-operative assessment

## 2020-09-13 NOTE — Op Note (Signed)
Patient name: Eric Gordon MRN: [id number] DOB: 09-Aug-1931 Sex: male  09/13/2020 Pre-operative Diagnosis: Asymptomatic right carotid stenosis Post-operative diagnosis:  Same Surgeon:  Durene CalWells Consuella Scurlock Assistants:  Clotilde Dieterhris Clark Procedure:   #1: Right transcarotid artery revascularization (carotid stent)   #2: Flow reversal neuro protection   #3: Ultrasound-guided cannulation, left femoral vein Anesthesia: General Blood Loss: Minimal Specimens: None  Findings: 90% stenosis reduced to less than 10% post stenting. Predilation balloon: 6 x 30 Stent: ENROUTE 9 x 40 Contrast: 25 cc Dose area: 2391 Fluoroscopy time: 3 minutes Procedure time: 50 minutes Flow reversal time 6 minutes   Indications: This is a 86 year old gentleman with bilateral carotid stenosis.  He is a high risk candidate due to his anatomy and well as cardiac issues.  He comes in today for right-sided intervention.    Procedure:  The patient was identified in the holding area and taken to [facility] ROOM 16  The patient was then placed supine on the table. general anesthesia was administered.  The patient was prepped and draped in the usual sterile fashion.  A time out was called and antibiotics were administered.  Dr. Chestine Sporelark performed ultrasound guided access of the left common femoral vein and insertion of the TCAR venous sheath under fluoroscopic visualization.  The right carotid artery was located with ultrasound.  A transverse incision was made above the clavicle between the 2 heads of the sternocleidomastoid.  Cautery was used about subcutaneous tissue and platysma muscle.  The avascular plane between the 2 heads of the sternocleidomastoid was developed and through this the common carotid artery was identified and circumferentially exposed.  A umbilical tape and Potts Vesseloops were placed.  The patient was fully heparinized.  A 5-0 Prolene U stitch was placed in the adventitia of the common carotid artery.  The carotid  artery was then cannulated under ultrasound guidance with a micropuncture needle.  A 018 wire was inserted up to the black line and the needle was removed and a micropuncture sheath was inserted for 3 cm.  A contrast injection was performed locating the origin of the plaque.  A carotid Amplatz wire was then inserted up to the plaque and the TCAR sheath was placed.  The TCAR sheath was sutured into position with 2 silk stitches.  The flow reversal tubing was connected.  Passive flow reversal was initiated and confirmed with saline flush.  A TCAR timeout was performed.  The ACT was 250.  Heart rate and blood pressure were in the appropriate range.  Active flow reversal was initiated by securing the proximal vessel loop.  This was confirmed with a saline flush.  Next a 018 wire and the 6 x 30 predilation balloon were inserted.  Oblique images were performed to open up the bifurcation.  This revealed a 90% internal carotid stenosis.  There was no dissection from the sheath insertion.  The wire was then advanced into the internal carotid artery.  The 6 x 30 balloon was used to predilate the lesion up to nominal pressure.  There was minimal hemodynamic response.  Next, a 9 x 40 stent was inserted and deployed.  I waited approximately 2 minutes and then performed completion imaging in multiple views which showed a fully expanded stent with no residual stenosis.  There was no dissection from the sheath insertion.  The wire was removed.  Next, the TCAR sheath was removed and the arteriotomy closed by securing the previously placed 5-0 Prolene.  Doppler was used to evaluate  the signals in the common carotid artery which were normal.  The patient's heparin was then reversed with 50 mg of protamine.  The sheath in the groin was removed and manual pressure was held for hemostasis.  The carotid incision was irrigated.  Hemostasis was achieved.  The platysma was closed with 3-0 Vicryl and the skin was closed with 3-0 Vicryl  followed by Dermabond.  The patient was successfully extubated and taken recovery room in stable condition.  He was neurologically intact   Disposition: To PACU, neurologically intact   V. [name], M.D., [facility] [place] Office: [phone] Pager:  [phone]

## 2020-09-14 LAB — CBC
HCT: 40.2 % (ref 39.0–52.0)
Hemoglobin: 12.6 g/dL — ABNORMAL LOW (ref 13.0–17.0)
MCH: 30.5 pg (ref 26.0–34.0)
MCHC: 31.3 g/dL (ref 30.0–36.0)
MCV: 97.3 fL (ref 80.0–100.0)
Platelets: 144 10*3/uL — ABNORMAL LOW (ref 150–400)
RBC: 4.13 MIL/uL — ABNORMAL LOW (ref 4.22–5.81)
RDW: 14.1 % (ref 11.5–15.5)
WBC: 8.4 10*3/uL (ref 4.0–10.5)
nRBC: 0 % (ref 0.0–0.2)

## 2020-09-14 LAB — BASIC METABOLIC PANEL
Anion gap: 10 (ref 5–15)
BUN: 19 mg/dL (ref 8–23)
CO2: 27 mmol/L (ref 22–32)
Calcium: 8.8 mg/dL — ABNORMAL LOW (ref 8.9–10.3)
Chloride: 102 mmol/L (ref 98–111)
Creatinine, Ser: 1.19 mg/dL (ref 0.61–1.24)
GFR, Estimated: 59 mL/min — ABNORMAL LOW (ref >60)
Glucose, Bld: 181 mg/dL — ABNORMAL HIGH (ref 70–99)
Potassium: 4 mmol/L (ref 3.5–5.1)
Sodium: 139 mmol/L (ref 135–145)

## 2020-09-14 MED ORDER — OXYCODONE-ACETAMINOPHEN 5-325 MG PO TABS: 1.0000 | ORAL_TABLET | Freq: Four times a day (QID) | ORAL | 0 refills | Status: DC | PRN

## 2020-09-14 MED ORDER — APIXABAN 5 MG PO TABS
5.0000 mg | ORAL_TABLET | Freq: Two times a day (BID) | ORAL | Status: DC
  Administered 2020-09-14: 10:00:00 5 mg via ORAL
  Filled 2020-09-14: qty 1

## 2020-09-14 MED ORDER — LEVOTHYROXINE SODIUM 50 MCG PO TABS: 50.0000 ug | ORAL_TABLET | Freq: Every day | ORAL | Status: DC

## 2020-09-14 NOTE — Progress Notes (Signed)
Patient discharged by wheelchair to [facility] tower by NT.  AVS given with pt education provided, PIV removed, telemetry discontinued.

## 2020-09-14 NOTE — Progress Notes (Addendum)
  Progress Note    09/14/2020 8:51 AM 1 Day Post-Op  Subjective:  Says he has a little soreness around the incision.  Sore to swallow but no difficulty.  Has been able to void.  Afebrile HR 50's-90's  81'E-751'Z systolic 001% 7CB4WH Vitals:   09/14/20 0700 09/14/20 0847  BP: 109/62 (!) 98/55  Pulse: 77 60  Resp: 20 20  Temp: 98.7 F (37.1 C) 98 F (36.7 C)  SpO2: 100% 100%     Physical Exam: Neuro:  In tact; tongue is midline and he is moving all extremities equally Lungs:  Non labored Incision:  Right neck incision is clean and dry; left groin soft without hematoma  CBC    Component Value Date/Time   WBC 8.4 09/14/2020 0208   RBC 4.13 (L) 09/14/2020 0208   HGB 12.6 (L) 09/14/2020 0208   HCT 40.2 09/14/2020 0208   PLT 144 (L) 09/14/2020 0208   MCV 97.3 09/14/2020 0208   MCH 30.5 09/14/2020 0208   MCHC 31.3 09/14/2020 0208   RDW 14.1 09/14/2020 0208   LYMPHSABS 1.5 03/08/2019 1228   MONOABS 0.6 03/08/2019 1228   EOSABS 0.3 03/08/2019 1228   BASOSABS 0.0 03/08/2019 1228    BMET    Component Value Date/Time   NA 139 09/14/2020 0208   K 4.0 09/14/2020 0208   CL 102 09/14/2020 0208   CO2 27 09/14/2020 0208   GLUCOSE 181 (H) 09/14/2020 0208   BUN 19 09/14/2020 0208   CREATININE 1.19 09/14/2020 0208   CALCIUM 8.8 (L) 09/14/2020 0208   GFRNONAA 59 (L) 09/14/2020 0208   GFRAA >60 02/21/2020 1147     Intake/Output Summary (Last 24 hours) at 09/14/2020 0851 Last data filed at 09/14/2020 0530 Gross per 24 hour  Intake 1380 ml  Output 250 ml  Net 1130 ml     Assessment/Plan:  This is a 86 y.o. male who is s/p right TCAR 1 Day Post-Op  -pt is doing well this am. -pt neuro exam is in tact -restart Eliquis this morning.  Will need to continue plavix/asa/statin -pt has not ambulated-needs to walk  -pt has voided -f/u with Dr. Trula Slade in 4 weeks with carotid duplex   Leontine Locket, PA-C [facility] [phone]   I have  interviewed and examined patient with PA and agree with assessment and plan above.   Xayne Brumbaugh C. Donzetta Matters, MD [facility] [place] Office: [phone] Pager: [phone]

## 2020-09-14 NOTE — Anesthesia Postprocedure Evaluation (Signed)
Anesthesia Post Note  Patient: Eric Gordon  Procedure(s) Performed: RIGHT TRANSCAROTID ARTERY REVASCULARIZATION USING 22mm X 22mm ENROUTE TRANSCAROTID STEND SYSTEM (Right Neck) ULTRASOUND GUIDANCE FOR VASCULAR ACCESS     Patient location during evaluation: PACU Anesthesia Type: General Level of consciousness: awake and alert Pain management: pain level controlled Vital Signs Assessment: post-procedure vital signs reviewed and stable Respiratory status: spontaneous breathing, nonlabored ventilation, respiratory function stable and patient connected to nasal cannula oxygen Cardiovascular status: blood pressure returned to baseline and stable Postop Assessment: no apparent nausea or vomiting Anesthetic complications: no   No complications documented.  Last Vitals:  Vitals:   09/14/20 0700 09/14/20 0847  BP: 109/62 (!) 98/55  Pulse: 77 60  Resp: 20 20  Temp: 37.1 C 36.7 C  SpO2: 100% 100%    Last Pain:  Vitals:   09/14/20 1200  TempSrc:   PainSc: 0-No pain                 Lu Paradise DAVID

## 2020-09-14 NOTE — Discharge Summary (Signed)
Discharge Summary     Eric Gordon Aug 05, 1931 86 y.o. male  [id number]  Admission Date: 09/13/2020  Discharge Date: 09/14/2020  Physician: Nada Libman, MD  Admission Diagnosis: Status post carotid endarterectomy [Z98.890] Carotid artery stenosis, symptomatic, right [I65.21]   HPI:   This is a 86 y.o. male who isreferred for bilateral carotid stenosis. He is asymptomatic. Specifically, he denies numbness or weakness in either extremity. He denies slurred speech. He denies amaurosis fugax.  I sent him for CT scan.  He is back today to discuss these results.  Patient has a history of coronary artery disease status post CABG. He has had a NSTEMI from occluded bypass grafts. He has atrial fibrillation. He is anticoagulated on Eliquis. He has had a spontaneous pneumothorax requiring chest tube decompression he gets dyspneic at 150 to 200 yards. He is medically managed for hypertension. He takes a statin for hypercholesterolemia. He has a known abdominal aortic aneurysm with ultrasound on 06/08/2020 showing maximal diameter 4.1 cm.  Hospital Course:  The patient was admitted to the hospital and taken to the operating room on 09/13/2020 and underwent right TCAR.    Findings: 90% stenosis reduced to less than 10% post stenting. Predilation balloon: 6 x 30 Stent: ENROUTE 9 x 40 Contrast: 25 cc Dose area: 2391 Fluoroscopy time: 3 minutes Procedure time: 50 minutes Flow reversal time 6 minutes  The pt tolerated the procedure well and was transported to the PACU in good condition.   By POD 1, the pt neuro status was in tact.  His Eliquis is restarted.  He has voided.     Recent Labs    09/14/20 0208  NA 139  K 4.0  CL 102  CO2 27  GLUCOSE 181*  BUN 19  CALCIUM 8.8*   Recent Labs    09/14/20 0208  WBC 8.4  HGB 12.6*  HCT 40.2  PLT 144*   No results for input(s): INR in the last 72 hours.   Discharge Instructions    Discharge patient    Complete by: As directed    Discharge pt after he has ambulated.  Thanks!   Discharge disposition: 01-Home or Self Care   Discharge patient date: 09/14/2020      Discharge Diagnosis:  Status post carotid endarterectomy [Z98.890] Carotid artery stenosis, symptomatic, right [I65.21]  Secondary Diagnosis: Patient Active Problem List   Diagnosis Date Noted  . Status post carotid endarterectomy 09/13/2020  . Carotid artery stenosis, symptomatic, right 09/13/2020  . Tear of lateral meniscus of knee 05/04/2020  . Osteoarthritis of left knee 04/05/2020  . Pain in left knee 04/03/2020  . Pleural effusion 04/12/2019  . Pneumothorax on left 01/10/2019  . Pneumothorax, left 01/09/2019  . CKD (chronic kidney disease), stage III (HCC) 01/09/2019  . Chronic diastolic CHF (congestive heart failure) (HCC) 01/09/2019  . Hypothyroidism 01/09/2019  . Pleural effusion on left 01/09/2019  . NSTEMI (non-ST elevated myocardial infarction) (HCC) 10/21/2018  . Acute diastolic CHF (congestive heart failure) (HCC)   . Non-ST elevation (NSTEMI) myocardial infarction (HCC) 10/17/2018  . CAP (community acquired pneumonia) 03/19/2018  . Hoarseness 12/15/2013  . Mild aortic stenosis 11/03/2012  . Atrial fibrillation (HCC) 04/02/2011  . Carotid stenosis, asymptomatic 04/02/2011  . CAD (coronary artery disease) 01/10/2011  . Abdominal aortic aneurysm (HCC) 11/06/2010  . CHEST TIGHTNESS-PRESSURE-OTHER 11/01/2010  . Asthma, mild intermittent 04/10/2010  . DYSPNEA 02/26/2010  . OBSTRUCTIVE SLEEP APNEA 08/22/2008  . Essential hypertension 08/22/2008  . BARRETTS ESOPHAGUS 08/18/2008  .  HIATAL HERNIA 08/18/2008  . DIVERTICULOSIS, COLON 08/18/2008  . COLONIC POLYPS, HX OF 08/18/2008   Past Medical History:  Diagnosis Date  . Abdominal aortic aneurysm (AAA) (HCC)   . Arthritis    "my whole body" (03/19/2018)  . Atrial fibrillation (HCC)   . CAD (coronary artery disease)   . Carotid artery disease (HCC)    . CHF (congestive heart failure) (HCC)   . Chronic anticoagulation    PE  . Chronic lower back pain   . Dyspnea   . GERD (gastroesophageal reflux disease)   . High cholesterol   . Hypertension   . Hypothyroidism   . Myocardial infarction [facility](HCC)    2 in Jan. 2019  . OSA on CPAP    uses CPAP  . Pneumonia    "now and once before" (03/19/2018)  . Pulmonary embolism [facility](HCC)    April 2012 after CABG  . S/P CABG (coronary artery bypass graft)   . Spontaneous pneumothorax    left spontaneous pneumothorax/left pleural effusion, s/p chest tube 01/09/19; thoracentesis x2, s/p left VATS/tacl pleurodesis 04/12/19    Allergies as of 09/14/2020      Reactions   Ancef [cefazolin Sodium] Hives   Vancomycin Rash      Medication List    TAKE these medications   acetaminophen 500 MG tablet Commonly known as: TYLENOL Take 1,000 mg by mouth every 6 (six) hours as needed for moderate pain or headache.   apixaban 5 MG Tabs tablet Commonly known as: ELIQUIS Take 1 tablet (5 mg total) by mouth 2 (two) times daily.   Ascorbic Acid 500 MG Caps Take 500 mg by mouth daily.   aspirin EC 81 MG tablet Take 81 mg by mouth daily. Swallow whole.   atorvastatin 80 MG tablet Commonly known as: LIPITOR Take 1 tablet (80 mg total) by mouth daily.   carvedilol 3.125 MG tablet Commonly known as: Coreg Take 1 tablet (3.125 mg total) by mouth 2 (two) times daily.   CENTRUM SILVER PO Take 1 tablet by mouth daily.   cetirizine 10 MG tablet Commonly known as: ZYRTEC Take 10 mg by mouth daily.   cholecalciferol 1000 units tablet Commonly known as: VITAMIN D Take 1,000 Units by mouth every evening.   clopidogrel 75 MG tablet Commonly known as: Plavix Take 1 tablet (75 mg total) by mouth daily.   furosemide 40 MG tablet Commonly known as: LASIX Take 40 mg by mouth daily.   isosorbide mononitrate 30 MG 24 hr tablet Commonly known as: IMDUR Take 2 tablets (60 mg total) by mouth daily.    levothyroxine 50 MCG tablet Commonly known as: SYNTHROID Take 50 mcg by mouth daily before breakfast.   oxyCODONE-acetaminophen 5-325 MG tablet Commonly known as: Percocet Take 1 tablet by mouth every 6 (six) hours as needed for moderate pain.   pantoprazole 40 MG tablet Commonly known as: PROTONIX Take 40 mg by mouth daily.   tamsulosin 0.4 MG Caps capsule Commonly known as: FLOMAX Take 0.4 mg by mouth daily after supper.        [facility] Discharge Instructions Carotid Endarterectomy (CEA)  Please refer to the following instructions for your post-procedure care. Your surgeon or physician assistant will discuss any changes with you.  Activity  You are encouraged to walk as much as you can. You can slowly return to normal activities but must avoid strenuous activity and heavy lifting until your doctor tell you it's OK. Avoid activities such as vacuuming  or swinging a golf club. You can drive after one week if you are comfortable and you are no longer taking prescription pain medications. It is normal to feel tired for serval weeks after your surgery. It is also normal to have difficulty with sleep habits, eating, and bowel movements after surgery. These will go away with time.  Bathing/Showering  You may shower after you come home. Do not soak in a bathtub, hot tub, or swim until the incision heals completely.  Incision Care  Shower every day. Clean your incision with mild soap and water. Pat the area dry with a clean towel. You do not need a bandage unless otherwise instructed. Do not apply any ointments or creams to your incision. You may have skin glue on your incision. Do not peel it off. It will come off on its own in about one week. Your incision may feel thickened and raised for several weeks after your surgery. This is normal and the skin will soften over time. For Men Only: It's OK to shave around the incision but do not shave the  incision itself for 2 weeks. It is common to have numbness under your chin that could last for several months.  Diet  Resume your normal diet. There are no special food restrictions following this procedure. A low fat/low cholesterol diet is recommended for all patients with vascular disease. In order to heal from your surgery, it is CRITICAL to get adequate nutrition. Your body requires vitamins, minerals, and protein. Vegetables are the best source of vitamins and minerals. Vegetables also provide the perfect balance of protein. Processed food has little nutritional value, so try to avoid this.  Medications  Resume taking all of your medications unless your doctor or physician assistant tells you not to.  If your incision is causing pain, you may take over-the- counter pain relievers such as acetaminophen (Tylenol). If you were prescribed a stronger pain medication, please be aware these medications can cause nausea and constipation.  Prevent nausea by taking the medication with a snack or meal. Avoid constipation by drinking plenty of fluids and eating foods with a high amount of fiber, such as fruits, vegetables, and grains.  Do not take Tylenol if you are taking prescription pain medications.  Follow Up  Our office will schedule a follow up appointment 2-3 weeks following discharge.  Please call us immediately for any of the following conditions  . Increased pain, redness, drainage (pus) from your incision site. . Fever of 101 degrees or higher. . If you should develop stroke (slurred speech, difficulty swallowing, weakness on one side of your body, loss of vision) you should call 911 and go to the nearest emergency room. .  Reduce your risk of vascular disease:  . Stop smoking. If you would like help call [facility] at 1-800-QUIT-NOW 743-383-6178) or [place] at (814) 409-1122. . Manage your cholesterol . Maintain a desired weight . Control your diabetes . Keep your blood  pressure down .  If you have any questions, please call the office at [phone].  Prescriptions given: 1.   Roxicet #8 No Refill-PDMP reviewed  Disposition: home  Patient's condition: is Good  Follow up: 1. Dr. Trula Slade in 4 weeks with carotid duplex   Leontine Locket, PA-C [facility] [phone]   --- For [facility] Registry use ---   Modified Rankin score at D/C (0-6): 0  IV medication needed for:  1. Hypertension: No 2. Hypotension: No  Post-op Complications: No  1. Post-op  CVA or TIA: No  If yes: Event classification (right eye, left eye, right cortical, left cortical, verterobasilar, other): n/a  If yes: Timing of event (intra-op, <6 hrs post-op, >=6 hrs post-op, unknown):  n/a  2. CN injury: No  If yes: CN n/a injuried   3. Myocardial infarction: No  If yes: Dx by (EKG or clinical, Troponin): n/a  4.  CHF: No  5.  Dysrhythmia (new): No  6. Wound infection: No  7. Reperfusion symptoms: No  8. Return to OR: No  If yes: return to OR for (bleeding, neurologic, other CEA incision, other): n/a  Discharge medications: Statin use:  Yes ASA use:  Yes   Beta blocker use:  Yes ACE-Inhibitor use:  No  ARB use:  No CCB use: No P2Y12 Antagonist use: Yes, [x ] Plavix, [ ]  Plasugrel, [ ]  Ticlopinine, [ ]  Ticagrelor, [ ]  Other, [ ]  No for medical reason, [ ]  Non-compliant, [ ]  Not-indicated Anti-coagulant use:  Yes, [ ]  Warfarin, [ ]  Rivaroxaban, [ ]  Dabigatran,  eliquis

## 2020-09-16 DIAGNOSIS — I110 Hypertensive heart disease with heart failure: Secondary | ICD-10-CM | POA: Diagnosis not present

## 2020-09-16 DIAGNOSIS — I4891 Unspecified atrial fibrillation: Secondary | ICD-10-CM | POA: Diagnosis not present

## 2020-09-16 DIAGNOSIS — I2510 Atherosclerotic heart disease of native coronary artery without angina pectoris: Secondary | ICD-10-CM | POA: Diagnosis not present

## 2020-09-16 DIAGNOSIS — I252 Old myocardial infarction: Secondary | ICD-10-CM | POA: Diagnosis not present

## 2020-09-16 DIAGNOSIS — I509 Heart failure, unspecified: Secondary | ICD-10-CM | POA: Diagnosis not present

## 2020-09-16 DIAGNOSIS — Z86711 Personal history of pulmonary embolism: Secondary | ICD-10-CM | POA: Diagnosis not present

## 2020-09-16 DIAGNOSIS — Z951 Presence of aortocoronary bypass graft: Secondary | ICD-10-CM | POA: Diagnosis not present

## 2020-09-16 DIAGNOSIS — Z7901 Long term (current) use of anticoagulants: Secondary | ICD-10-CM | POA: Diagnosis not present

## 2020-09-16 DIAGNOSIS — Z48812 Encounter for surgical aftercare following surgery on the circulatory system: Secondary | ICD-10-CM | POA: Diagnosis not present

## 2020-09-16 DIAGNOSIS — M6281 Muscle weakness (generalized): Secondary | ICD-10-CM | POA: Diagnosis not present

## 2020-09-16 DIAGNOSIS — I6523 Occlusion and stenosis of bilateral carotid arteries: Secondary | ICD-10-CM | POA: Diagnosis not present

## 2020-09-17 ENCOUNTER — Encounter (HOSPITAL_COMMUNITY): Payer: Self-pay | Admitting: Surgery

## 2020-09-17 ENCOUNTER — Other Ambulatory Visit: Payer: Self-pay | Admitting: *Deleted

## 2020-09-17 NOTE — Patient Outreach (Signed)
[facility]) Care Management  09/17/2020  Eric Gordon 09/14/1931 [id number]  EMMI Red Flag Alert   Day: 1 Date: 09/16/20 Reason : Scheduled Follow up ? No    Inpatient Hospital Admission [place] 12/23-12/24/21 , Right Carotid Endarterectomy.   PMHX includes : MI, chronic diastolic heart failure, atrial fib,   Outreach Attempt #1 Subjective : Successful outreach call to patient, his wife [name], designated party release answered the phone, HIPAA verified. Explained automated EMMI Discharge call patient received, wife states she is aware of call patient received.  Addressed EMMI red alert reason: Scheduled follow up appointment ? No  Patient wife states that they have placed call to office on today regarding scheduling appointment , awaiting return call, she reports plan to return on next day if no response.   Wife discussed that patient is doing really well since discharge, tolerating mobility in home, he did have home health physical therapy visit with [facility] on 12/16 and patient tolerating well working on balance. He is tolerating taking a shower, incision area without increase in redness or swelling. Patient tolerating diet without complaint of difficulty swallowing. She reports that his pain is managed with prn tylenol.  Wife states that she is able to assist patient as needed in home, with transportation , medication as needed. She denies any other concerns at this time.  Explained that patient may receive additional automated discharge call and depending on response may trigger for RN to return call. Wife is agreeable to return follow up call regarding scheduling post discharge office visit.   Plan  Will plan return call in the next 4 business days regarding scheduling post discharge office visit.    Egbert Garibaldi, RN, BSN  [facility] Care Management,Care Management Coordinator  [phone]- Mobile [phone]- Toll Free Main  Office

## 2020-09-18 ENCOUNTER — Other Ambulatory Visit: Payer: Self-pay

## 2020-09-18 DIAGNOSIS — I6523 Occlusion and stenosis of bilateral carotid arteries: Secondary | ICD-10-CM

## 2020-09-18 DIAGNOSIS — I110 Hypertensive heart disease with heart failure: Secondary | ICD-10-CM | POA: Diagnosis not present

## 2020-09-18 DIAGNOSIS — Z48812 Encounter for surgical aftercare following surgery on the circulatory system: Secondary | ICD-10-CM | POA: Diagnosis not present

## 2020-09-18 DIAGNOSIS — I509 Heart failure, unspecified: Secondary | ICD-10-CM | POA: Diagnosis not present

## 2020-09-18 DIAGNOSIS — I2510 Atherosclerotic heart disease of native coronary artery without angina pectoris: Secondary | ICD-10-CM | POA: Diagnosis not present

## 2020-09-18 DIAGNOSIS — I4891 Unspecified atrial fibrillation: Secondary | ICD-10-CM | POA: Diagnosis not present

## 2020-09-19 ENCOUNTER — Other Ambulatory Visit: Payer: Self-pay | Admitting: *Deleted

## 2020-09-19 DIAGNOSIS — Z48812 Encounter for surgical aftercare following surgery on the circulatory system: Secondary | ICD-10-CM | POA: Diagnosis not present

## 2020-09-19 DIAGNOSIS — I6523 Occlusion and stenosis of bilateral carotid arteries: Secondary | ICD-10-CM | POA: Diagnosis not present

## 2020-09-19 DIAGNOSIS — I509 Heart failure, unspecified: Secondary | ICD-10-CM | POA: Diagnosis not present

## 2020-09-19 DIAGNOSIS — I4891 Unspecified atrial fibrillation: Secondary | ICD-10-CM | POA: Diagnosis not present

## 2020-09-19 DIAGNOSIS — I110 Hypertensive heart disease with heart failure: Secondary | ICD-10-CM | POA: Diagnosis not present

## 2020-09-19 DIAGNOSIS — I2510 Atherosclerotic heart disease of native coronary artery without angina pectoris: Secondary | ICD-10-CM | POA: Diagnosis not present

## 2020-09-19 NOTE — Patient Outreach (Signed)
[facility]) Care Management  09/19/2020  KEROLOS NEHME 1931-07-01 [id number]   EMMI Red Flag Alert   Day: 1 Date: 09/16/20 Reason : Scheduled Follow up ? No    Inpatient Hospital Admission [place] 12/23-12/24/21 , Right Carotid Endarterectomy.   PMHX includes : MI, chronic diastolic heart failure, atrial fib,   Outreach Attempt #1 Successful follow up call to patient , explained reason for the call, able to speak with patient wife Vashawn Ekstein, designated party release, she states patient is resting.  Discussed red flag alert. She states that they have been able to schedule post discharge visit.  She reports that patient is doing good, everything has been addressed denies any further concerns,he has home health RN and physical therapy.  Explained to patient wife that he may receive an additional automated call, she states that she received a call on today .    Plan EMMI red alert addressed , no further care management needs to be addressed, will close to [facility] care management .   Egbert Garibaldi, RN, BSN  [facility] Care Management,Care Management Coordinator  [phone]- Mobile [phone]- Toll Free Main Office

## 2020-09-20 DIAGNOSIS — Z48812 Encounter for surgical aftercare following surgery on the circulatory system: Secondary | ICD-10-CM | POA: Diagnosis not present

## 2020-09-20 DIAGNOSIS — I110 Hypertensive heart disease with heart failure: Secondary | ICD-10-CM | POA: Diagnosis not present

## 2020-09-20 DIAGNOSIS — I6523 Occlusion and stenosis of bilateral carotid arteries: Secondary | ICD-10-CM | POA: Diagnosis not present

## 2020-09-20 DIAGNOSIS — I509 Heart failure, unspecified: Secondary | ICD-10-CM | POA: Diagnosis not present

## 2020-09-20 DIAGNOSIS — I4891 Unspecified atrial fibrillation: Secondary | ICD-10-CM | POA: Diagnosis not present

## 2020-09-20 DIAGNOSIS — I2510 Atherosclerotic heart disease of native coronary artery without angina pectoris: Secondary | ICD-10-CM | POA: Diagnosis not present

## 2020-09-25 DIAGNOSIS — I4891 Unspecified atrial fibrillation: Secondary | ICD-10-CM | POA: Diagnosis not present

## 2020-09-25 DIAGNOSIS — I110 Hypertensive heart disease with heart failure: Secondary | ICD-10-CM | POA: Diagnosis not present

## 2020-09-25 DIAGNOSIS — Z48812 Encounter for surgical aftercare following surgery on the circulatory system: Secondary | ICD-10-CM | POA: Diagnosis not present

## 2020-09-25 DIAGNOSIS — I6523 Occlusion and stenosis of bilateral carotid arteries: Secondary | ICD-10-CM | POA: Diagnosis not present

## 2020-09-25 DIAGNOSIS — I2510 Atherosclerotic heart disease of native coronary artery without angina pectoris: Secondary | ICD-10-CM | POA: Diagnosis not present

## 2020-09-25 DIAGNOSIS — I509 Heart failure, unspecified: Secondary | ICD-10-CM | POA: Diagnosis not present

## 2020-09-28 DIAGNOSIS — I509 Heart failure, unspecified: Secondary | ICD-10-CM | POA: Diagnosis not present

## 2020-09-28 DIAGNOSIS — I4891 Unspecified atrial fibrillation: Secondary | ICD-10-CM | POA: Diagnosis not present

## 2020-09-28 DIAGNOSIS — I6523 Occlusion and stenosis of bilateral carotid arteries: Secondary | ICD-10-CM | POA: Diagnosis not present

## 2020-09-28 DIAGNOSIS — I110 Hypertensive heart disease with heart failure: Secondary | ICD-10-CM | POA: Diagnosis not present

## 2020-09-28 DIAGNOSIS — Z48812 Encounter for surgical aftercare following surgery on the circulatory system: Secondary | ICD-10-CM | POA: Diagnosis not present

## 2020-09-28 DIAGNOSIS — I2510 Atherosclerotic heart disease of native coronary artery without angina pectoris: Secondary | ICD-10-CM | POA: Diagnosis not present

## 2020-10-02 ENCOUNTER — Ambulatory Visit (INDEPENDENT_AMBULATORY_CARE_PROVIDER_SITE_OTHER): Payer: No Typology Code available for payment source | Admitting: Gastroenterology

## 2020-10-02 ENCOUNTER — Encounter: Payer: Self-pay | Admitting: Gastroenterology

## 2020-10-02 VITALS — BP 100/72 | HR 60 | Ht 75.0 in | Wt 206.0 lb

## 2020-10-02 DIAGNOSIS — Z9889 Other specified postprocedural states: Secondary | ICD-10-CM | POA: Diagnosis not present

## 2020-10-02 DIAGNOSIS — I480 Paroxysmal atrial fibrillation: Secondary | ICD-10-CM

## 2020-10-02 DIAGNOSIS — Z7901 Long term (current) use of anticoagulants: Secondary | ICD-10-CM

## 2020-10-02 DIAGNOSIS — K449 Diaphragmatic hernia without obstruction or gangrene: Secondary | ICD-10-CM

## 2020-10-02 DIAGNOSIS — I110 Hypertensive heart disease with heart failure: Secondary | ICD-10-CM | POA: Diagnosis not present

## 2020-10-02 DIAGNOSIS — R1314 Dysphagia, pharyngoesophageal phase: Secondary | ICD-10-CM | POA: Diagnosis not present

## 2020-10-02 DIAGNOSIS — Z48812 Encounter for surgical aftercare following surgery on the circulatory system: Secondary | ICD-10-CM | POA: Diagnosis not present

## 2020-10-02 DIAGNOSIS — I2510 Atherosclerotic heart disease of native coronary artery without angina pectoris: Secondary | ICD-10-CM | POA: Diagnosis not present

## 2020-10-02 DIAGNOSIS — K2270 Barrett's esophagus without dysplasia: Secondary | ICD-10-CM

## 2020-10-02 DIAGNOSIS — I509 Heart failure, unspecified: Secondary | ICD-10-CM | POA: Diagnosis not present

## 2020-10-02 DIAGNOSIS — Z7902 Long term (current) use of antithrombotics/antiplatelets: Secondary | ICD-10-CM

## 2020-10-02 DIAGNOSIS — I4891 Unspecified atrial fibrillation: Secondary | ICD-10-CM | POA: Diagnosis not present

## 2020-10-02 DIAGNOSIS — I5032 Chronic diastolic (congestive) heart failure: Secondary | ICD-10-CM

## 2020-10-02 DIAGNOSIS — I6523 Occlusion and stenosis of bilateral carotid arteries: Secondary | ICD-10-CM | POA: Diagnosis not present

## 2020-10-02 NOTE — Progress Notes (Signed)
[facility] Consult Note:  History: Eric Gordon 10/02/2020  Referring provider: [facility])  Reason for consult/chief complaint: Dysphagia (Patient has history of Barrett's Esophagus, hx of dilation)   Subjective  HPI:  This is a very pleasant 88 year old man accompanied by his wife and referred by his provider at the local [place] clinic. He believes he was primarily referred for previous history of Barrett's esophagus and concerned that this needed follow-up.  For several months he has felt solid food, especially meat, intermittently hung up in the throat.  He has not had to bring it back up, he can swallow liquids and pills without difficulty.  His appetite has been very good and his weight stable.  He denies abdominal pain altered bowel habits or rectal bleeding.  This patient underwent right carotid stent placement after balloon predilation by Dr. Trula Slade.  (Was briefly off Eliquis for the procedure).  That procedure report was reviewed.  Preprocedure clinic note from 08/27/2020 was reviewed, noting patient had no neurologic symptoms from the carotid stenosis.  Plavix was added to his aspirin at the time of that visit.  Upper endoscopy by Dr. Sharlett Iles December 2009 for reflux symptoms.  5 cm hiatal hernia noted, no Barrett's on white light or NBI imaging.  Random EG junction biopsies reportedly taken which showed intestinal metaplasia (query possible gastric cardia tissue given endoscopy report) ROS:  Review of Systems  Constitutional: Negative for appetite change and unexpected weight change.  HENT: Negative for mouth sores and voice change.   Eyes: Negative for pain and redness.  Respiratory: Positive for shortness of breath. Negative for cough.   Cardiovascular: Negative for chest pain and palpitations.  Genitourinary: Negative for dysuria and hematuria.  Musculoskeletal: Negative for arthralgias and myalgias.  Skin: Negative for pallor  and rash.  Neurological: Negative for weakness and headaches.  Hematological: Negative for adenopathy.     Past Medical History: Past Medical History:  Diagnosis Date  . Abdominal aortic aneurysm (AAA) ([place])   . Achilles tendinitis of right lower extremity   . Arthritis    "my whole body" (03/19/2018)  . Atherosclerosis of coronary artery bypass graft w/o angina pectoris   . Atrial fibrillation ([place])   . Barrett's esophagus   . CAD (coronary artery disease)   . Carotid artery disease ([place])   . CHF (congestive heart failure) ([place])   . Chronic anticoagulation    PE  . Chronic lower back pain   . Dyspnea   . Essential tremor   . GERD (gastroesophageal reflux disease)   . High cholesterol   . Hyperlipidemia   . Hyperplasia, prostate   . Hypertension   . Hypothyroidism   . Myocardial infarction [facility])    2 in Jan. 2019  . Nail dystrophy   . OSA on CPAP    uses CPAP  . Osteoarthrosis   . Pneumonia    "now and once before" (03/19/2018)  . Pulmonary embolism [facility])    April 2012 after CABG  . S/P CABG (coronary artery bypass graft)   . Sleep apnea   . Spontaneous pneumothorax    left spontaneous pneumothorax/left pleural effusion, s/p chest tube 01/09/19; thoracentesis x2, s/p left VATS/tacl pleurodesis 04/12/19   Complex cardiopulmonary history reviewed in detailed office note from cardiology on 08/14/2020.  In addition to congestive heart failure, patient has medically managed coronary artery disease, atrial fibrillation and in April 2020 had a spontaneous pneumothorax requiring a chest tube with subsequent recurrent left pleural effusions  treated with VATS.  Patient has chronic dyspnea as a result.  Past Surgical History: Past Surgical History:  Procedure Laterality Date  . ACHILLES TENDON REPAIR Bilateral    2006 & 2004  . CARDIAC CATHETERIZATION  11/2010  . CORONARY ANGIOGRAPHY N/A 10/22/2018   Procedure: CORONARY ANGIOGRAPHY;  Surgeon: Troy Sine, MD;  Location: [place] CV LAB;  Service: Cardiovascular;  Laterality: N/A;  . CORONARY ARTERY BYPASS GRAFT  March 2012   CABG X 5  . INGUINAL HERNIA REPAIR Left 1980  . INTRAVASCULAR PRESSURE WIRE/FFR STUDY N/A 10/22/2018   Procedure: INTRAVASCULAR PRESSURE WIRE/FFR STUDY;  Surgeon: Troy Sine, MD;  Location: [place] CV LAB;  Service: Cardiovascular;  Laterality: N/A;  . IR THORACENTESIS ASP PLEURAL SPACE W/IMG GUIDE  02/04/2019  . IR THORACENTESIS ASP PLEURAL SPACE W/IMG GUIDE  02/24/2019  . KNEE ARTHROSCOPY Left 2006  . LEFT HEART CATH AND CORS/GRAFTS ANGIOGRAPHY N/A 10/18/2018   Procedure: LEFT HEART CATH AND CORS/GRAFTS ANGIOGRAPHY;  Surgeon: Lorretta Harp, MD;  Location: [place] CV LAB;  Service: Cardiovascular;  Laterality: N/A;  . LEFT HEART CATH AND CORS/GRAFTS ANGIOGRAPHY N/A 10/21/2018   Procedure: LEFT HEART CATH AND CORS/GRAFTS ANGIOGRAPHY;  Surgeon: Troy Sine, MD;  Location: [place] CV LAB;  Service: Cardiovascular;  Laterality: N/A;  . PENILE PROSTHESIS IMPLANT    . PLEURAL BIOPSY Left 04/12/2019   Procedure: Pleural Biopsy;  Surgeon: Grace Isaac, MD;  Location: [place];  Service: Thoracic;  Laterality: Left;  . PLEURAL EFFUSION DRAINAGE Left 04/12/2019   Procedure: Drainage Of Pleural Effusion;  Surgeon: Grace Isaac, MD;  Location: [place];  Service: Thoracic;  Laterality: Left;  . RIGHT HEART CATHETERIZATION N/A 11/14/2013   Procedure: RIGHT HEART CATH;  Surgeon: Larey Dresser, MD;  Location: [facility] CATH LAB;  Service: Cardiovascular;  Laterality: N/A;  . SHOULDER OPEN ROTATOR CUFF REPAIR Right 2003  . TALC PLEURODESIS Left 04/12/2019   Procedure: Pietro Cassis;  Surgeon: Grace Isaac, MD;  Location: [place];  Service: Thoracic;  Laterality: Left;  . TEE WITHOUT CARDIOVERSION N/A 03/24/2019   Procedure: TRANSESOPHAGEAL ECHOCARDIOGRAM (TEE);  Surgeon: Larey Dresser, MD;  Location[facility] ENDOSCOPY;  Service: Cardiovascular;  Laterality: N/A;  . TEE WITHOUT  CARDIOVERSION  04/12/2019   Procedure: Transesophageal Echocardiogram (Tee);  Surgeon: Grace Isaac, MD;  Location[facility];  Service: Thoracic;;  . THORACOSCOPY Left 04/12/2019   VIDEO BRONCHOSCOPY (N/A ) VIDEO ASSISTED THORACOSCOPY (Left Chest) TALC PLEURADESIS (Left   . TRANSCAROTID ARTERY REVASCULARIZATION Right 09/13/2020   Procedure: RIGHT TRANSCAROTID ARTERY REVASCULARIZATION USING 78mm X 30mm ENROUTE TRANSCAROTID STEND SYSTEM;  Surgeon: Serafina Mitchell, MD;  Location: [place];  Service: Vascular;  Laterality: Right;  . ULTRASOUND GUIDANCE FOR VASCULAR ACCESS  09/13/2020   Procedure: ULTRASOUND GUIDANCE FOR VASCULAR ACCESS;  Surgeon: Serafina Mitchell, MD;  Location: [place];  Service: Vascular;;  . VIDEO ASSISTED THORACOSCOPY Left 04/12/2019   Procedure: VIDEO ASSISTED THORACOSCOPY;  Surgeon: Grace Isaac, MD;  Location: [place];  Service: Thoracic;  Laterality: Left;  Marland Kitchen VIDEO BRONCHOSCOPY N/A 04/12/2019   Procedure: VIDEO BRONCHOSCOPY;  Surgeon: Grace Isaac, MD;  Location: [facility] OR;  Service: Thoracic;  Laterality: N/A;     Family History: Family History  Problem Relation Age of Onset  . Coronary artery disease Mother   . Hypertension Mother   . Heart disease Mother     Social History: Social History   Socioeconomic History  .  Marital status: Married    Spouse name: Not on file  . Number of children: 2  . Years of education: Not on file  . Highest education level: Not on file  Occupational History  . Occupation: Scientist, clinical (histocompatibility and immunogenetics): [employer]  Tobacco Use  . Smoking status: Former Smoker    Packs/day: 1.00    Years: 35.00    Pack years: 35.00    Types: Cigarettes    Quit date: 02/18/1984    Years since quitting: 36.6  . Smokeless tobacco: Never Used  Vaping Use  . Vaping Use: Never used  Substance and Sexual Activity  . Alcohol use: Yes    Alcohol/week: 2.0 standard drinks    Types: 2 Standard drinks or equivalent per week  . Drug use: Never  . Sexual  activity: Not on file  Other Topics Concern  . Not on file  Social History Narrative  . Not on file   Social Determinants of Health   Financial Resource Strain: Not on file  Food Insecurity: Not on file  Transportation Needs: Not on file  Physical Activity: Not on file  Stress: Not on file  Social Connections: Not on file    Allergies: Allergies  Allergen Reactions  . Ancef [Cefazolin Sodium] Hives  . Vancomycin Rash    Outpatient Meds: Current Outpatient Medications  Medication Sig Dispense Refill  . acetaminophen (TYLENOL) 500 MG tablet Take 1,000 mg by mouth every 6 (six) hours as needed for moderate pain or headache.    Marland Kitchen apixaban (ELIQUIS) 5 MG TABS tablet Take 1 tablet (5 mg total) by mouth 2 (two) times daily. 60 tablet 5  . Ascorbic Acid 500 MG CAPS Take 500 mg by mouth daily.    Marland Kitchen aspirin EC 81 MG tablet Take 81 mg by mouth daily. Swallow whole.    Marland Kitchen atorvastatin (LIPITOR) 80 MG tablet Take 1 tablet (80 mg total) by mouth daily. 30 tablet 6  . cetirizine (ZYRTEC) 10 MG tablet Take 10 mg by mouth daily.    . cholecalciferol (VITAMIN D) 1000 UNITS tablet Take 1,000 Units by mouth every evening.    . clopidogrel (PLAVIX) 75 MG tablet Take 1 tablet (75 mg total) by mouth daily. 30 tablet 3  . furosemide (LASIX) 40 MG tablet Take 40 mg by mouth daily.    . isosorbide mononitrate (IMDUR) 30 MG 24 hr tablet Take 2 tablets (60 mg total) by mouth daily.    Marland Kitchen levothyroxine (SYNTHROID, LEVOTHROID) 50 MCG tablet Take 50 mcg by mouth daily before breakfast.    . Multiple Vitamins-Minerals (CENTRUM SILVER PO) Take 1 tablet by mouth daily.     Marland Kitchen oxyCODONE-acetaminophen (PERCOCET) 5-325 MG tablet Take 1 tablet by mouth every 6 (six) hours as needed for moderate pain. 8 tablet 0  . pantoprazole (PROTONIX) 40 MG tablet Take 40 mg by mouth daily.     . tamsulosin (FLOMAX) 0.4 MG CAPS capsule Take 0.4 mg by mouth daily after supper.     . carvedilol (COREG) 3.125 MG tablet Take 1 tablet  (3.125 mg total) by mouth 2 (two) times daily. 60 tablet 11   No current facility-administered medications for this visit.      ___________________________________________________________________ Objective   Exam:  BP 100/72   Pulse 60   Ht 6\' 3"  (1.905 m)   Wt 206 lb (93.4 kg)   BMI 25.75 kg/m  Wt Readings from Last 3 Encounters:  10/02/20 206 lb (93.4 kg)  09/13/20 207  lb 4.8 oz (94 kg)  09/10/20 207 lb 4.8 oz (94 kg)   His wife was present for the entire visit.  General: Ambulatory, well-appearing and conversational  Eyes: sclera anicteric, no redness  ENT: oral mucosa moist without lesions, no cervical or supraclavicular lymphadenopathy  CV: RRR with systolic murmur, no JVD, no peripheral edema  Resp: clear to auscultation bilaterally, normal RR and effort noted  GI: soft, no tenderness, with active bowel sounds. No guarding or palpable organomegaly noted.  Rectus diastasis  Skin; warm and dry, no rash or jaundice noted  Neuro: awake, alert and oriented x 3. Normal gross motor function and fluent speech  Labs:  CBC Latest Ref Rng & Units 09/14/2020 09/10/2020 08/14/2020  WBC 4.0 - 10.5 K/uL 8.4 7.4 7.1  Hemoglobin 13.0 - 17.0 g/dL 12.6(L) 13.5 13.8  Hematocrit 39.0 - 52.0 % 40.2 41.2 43.2  Platelets 150 - 400 K/uL 144(L) 140(L) 147(L)   CMP Latest Ref Rng & Units 09/14/2020 09/10/2020 08/14/2020  Glucose 70 - 99 mg/dL 181(H) 103(H) 94  BUN 8 - 23 mg/dL 19 20 17   Creatinine 0.61 - 1.24 mg/dL 1.19 1.01 1.19  Sodium 135 - 145 mmol/L 139 140 141  Potassium 3.5 - 5.1 mmol/L 4.0 3.6 4.2  Chloride 98 - 111 mmol/L 102 104 102  CO2 22 - 32 mmol/L 27 25 29   Calcium 8.9 - 10.3 mg/dL 8.8(L) 9.3 9.4  Total Protein 6.5 - 8.1 g/dL - 6.2(L) -  Total Bilirubin 0.3 - 1.2 mg/dL - 0.9 -  Alkaline Phos 38 - 126 U/L - 91 -  AST 15 - 41 U/L - 22 -  ALT 0 - 44 U/L - 16 -     Radiologic Studies:  Nuclear stress test 08/28/2020:  The left ventricular ejection fraction  is moderately decreased (30-44%).  Nuclear stress EF: 41%.  Blood pressure demonstrated a hypertensive response to exercise.  There was no ST segment deviation noted during stress.  Defect 1: There is a medium defect of severe severity present in the basal inferior, basal inferolateral, mid inferior and mid inferolateral location.  This is an intermediate risk study.   There is medium size, severe severity irreversible defect in the basal and mid inferior and inferolateral walls consistent with prior infarct but no peri-infarct ischemia. LVEF 41% with akinesis in the  basal and mid inferior and inferolateral walls.  (Cardiology recommendation was no catheterization unless patient develop chest pain, and then echocardiogram was planned but not yet done)  Assessment: Encounter Diagnoses  Name Primary?  . Pharyngoesophageal dysphagia Yes  . Barrett's esophagus without dysplasia   . Hiatal hernia   . Status post carotid endarterectomy   . Chronic diastolic CHF (congestive heart failure) ([place])   . Paroxysmal atrial fibrillation ([facility])   . Current use of long term anticoagulation   . Long term (current) use of antithrombotics/antiplatelets     His dysphagia sounds centered in the throat more like upper esophageal sphincter dysfunction then stricture. I am doubtful of his Barrett's diagnosis given the endoscopic description of no visible Barrett's but then random biopsies from the EG junction that may have truly been from the gastric cardia.  Nevertheless, it is possible the Barrett's diagnosis is accurate.  While this patient would be passed usual age of surveillance for that, especially given his multiple complex medical conditions and increased procedural risk, his dysphagia in the setting of possible Barrett's warrants further evaluation.  I recommended upper endoscopy, but planning that must wait  until his upcoming vascular surgery appointment, at which time Abimelec believes that he might  be taken off Plavix.  He also has an upcoming echocardiogram.  We would then need to communicate with his cardiologist to have patient off Eliquis 2 days prior to an endoscopy, and I suspect they will be agreeable since this happened before his recent carotid procedure. His upper endoscopy should be performed in the hospital outpatient endoscopy lab due to his medical conditions and increased procedural risk.  He was agreeable after discussion of procedure and risks, and further procedural planning ending his upcoming test results and vascular surgery follow-up.   Thank you for the courtesy of this consult.  Please call me with any questions or concerns.  Nelida Meuse III  CC: Referring provider noted above

## 2020-10-02 NOTE — Patient Instructions (Signed)
If you are age 85 or older, your body mass index should be between 23-30. Your Body mass index is 25.75 kg/m. If this is out of the aforementioned range listed, please consider follow up with your Primary Care Provider.  If you are age 42 or younger, your body mass index should be between 19-25. Your Body mass index is 25.75 kg/m. If this is out of the aformentioned range listed, please consider follow up with your Primary Care Provider.   We will await cardiac work up to determine the treatment plan.  It was a pleasure to see you today!  Dr. Loletha Carrow

## 2020-10-08 ENCOUNTER — Encounter: Payer: Medicare Other | Admitting: Surgery

## 2020-10-08 ENCOUNTER — Encounter (HOSPITAL_COMMUNITY): Payer: Medicare Other

## 2020-10-15 ENCOUNTER — Ambulatory Visit (HOSPITAL_COMMUNITY)
Admission: RE | Admit: 2020-10-15 | Discharge: 2020-10-15 | Disposition: A | Discharge status: Home / Self Care | Payer: Medicare Other | Source: Ambulatory Visit | Attending: Cardiology | Admitting: Cardiology

## 2020-10-15 ENCOUNTER — Other Ambulatory Visit: Payer: Self-pay

## 2020-10-15 DIAGNOSIS — I5032 Chronic diastolic (congestive) heart failure: Secondary | ICD-10-CM | POA: Diagnosis not present

## 2020-10-15 DIAGNOSIS — I4891 Unspecified atrial fibrillation: Secondary | ICD-10-CM | POA: Diagnosis not present

## 2020-10-15 DIAGNOSIS — I110 Hypertensive heart disease with heart failure: Secondary | ICD-10-CM | POA: Insufficient documentation

## 2020-10-15 DIAGNOSIS — G4730 Sleep apnea, unspecified: Secondary | ICD-10-CM | POA: Diagnosis not present

## 2020-10-15 DIAGNOSIS — E785 Hyperlipidemia, unspecified: Secondary | ICD-10-CM | POA: Diagnosis not present

## 2020-10-15 DIAGNOSIS — Z951 Presence of aortocoronary bypass graft: Secondary | ICD-10-CM | POA: Insufficient documentation

## 2020-10-15 LAB — ECHOCARDIOGRAM COMPLETE
AV Mean grad: 11 mmHg
Area-P 1/2: 3.53 cm2
S' Lateral: 4.4 cm

## 2020-10-15 NOTE — Progress Notes (Signed)
Echocardiogram 2D Echocardiogram has been performed.  Eric Gordon Kennis Wissmann 10/15/2020, 11:33 AM

## 2020-10-19 ENCOUNTER — Telehealth: Payer: Self-pay

## 2020-10-19 NOTE — Telephone Encounter (Signed)
Pt's wife called with concerns about making it to the post op f/u on Monday due to potential snow because they have been concerned about pt experiencing several weeks of generalized weakness, blurred vision and light headedness. It mainly occurs in the mornings and he lays down and then it resolves. His BP does run low but they have been monitoring it. She denies any facial drooping, one sided weakness, slurred speech, confusion and will report to ED immediately if that occurs; she verbalized understanding of stroke symptoms and importance of getting to ED immediately.

## 2020-10-22 ENCOUNTER — Ambulatory Visit (INDEPENDENT_AMBULATORY_CARE_PROVIDER_SITE_OTHER): Payer: Medicare Other | Admitting: Surgery

## 2020-10-22 ENCOUNTER — Other Ambulatory Visit: Payer: Self-pay

## 2020-10-22 ENCOUNTER — Ambulatory Visit (HOSPITAL_COMMUNITY)
Admission: RE | Admit: 2020-10-22 | Discharge: 2020-10-22 | Disposition: A | Discharge status: Home / Self Care | Payer: Medicare Other | Source: Ambulatory Visit | Attending: Surgery | Admitting: Surgery

## 2020-10-22 ENCOUNTER — Encounter: Payer: Self-pay | Admitting: Surgery

## 2020-10-22 VITALS — BP 110/68 | HR 62 | Temp 98.0°F | Resp 20 | Ht 75.0 in | Wt 205.0 lb

## 2020-10-22 DIAGNOSIS — I6523 Occlusion and stenosis of bilateral carotid arteries: Secondary | ICD-10-CM

## 2020-10-22 NOTE — Progress Notes (Signed)
Patient name: Eric Gordon MRN: [id number] DOB: Jul 11, 1931 Sex: male  REASON FOR VISIT:     post op  HISTORY OF PRESENT ILLNESS:   Eric Gordon is a 87 y.o. male with asymptomatic bilateral carotid stenosis, documented by CT scan.  On 09/13/2020 he underwent right sided TCAR.  Intraoperative findings included a 90% stenosis  He reports some blurry vision that usually occurs in the morning.  It involves both eyes.  He usually takes an hour nap and it is gone for the rest of the day.   Patient has a history of coronary artery disease status post CABG. He has had a NSTEMI from occluded bypass grafts. He has atrial fibrillation. He is anticoagulated on Eliquis. He has had a spontaneous pneumothorax requiring chest tube decompression he gets dyspneic at 150 to 200 yards. He is medically managed for hypertension. He takes a statin for hypercholesterolemia. He has a known abdominal aortic aneurysm with ultrasound on 06/08/2020 showing maximal diameter 4.1 cm. CURRENT MEDICATIONS:    Current Outpatient Medications  Medication Sig Dispense Refill  . acetaminophen (TYLENOL) 500 MG tablet Take 1,000 mg by mouth every 6 (six) hours as needed for moderate pain or headache.    Marland Kitchen apixaban (ELIQUIS) 5 MG TABS tablet Take 1 tablet (5 mg total) by mouth 2 (two) times daily. 60 tablet 5  . Ascorbic Acid 500 MG CAPS Take 500 mg by mouth daily.    Marland Kitchen aspirin EC 81 MG tablet Take 81 mg by mouth daily. Swallow whole.    Marland Kitchen atorvastatin (LIPITOR) 80 MG tablet Take 1 tablet (80 mg total) by mouth daily. 30 tablet 6  . cetirizine (ZYRTEC) 10 MG tablet Take 10 mg by mouth daily.    . cholecalciferol (VITAMIN D) 1000 UNITS tablet Take 1,000 Units by mouth every evening.    . clopidogrel (PLAVIX) 75 MG tablet Take 1 tablet (75 mg total) by mouth daily. 30 tablet 3  . furosemide (LASIX) 40 MG tablet Take 40 mg by mouth daily.    . isosorbide mononitrate (IMDUR)  30 MG 24 hr tablet Take 2 tablets (60 mg total) by mouth daily.    Marland Kitchen levothyroxine (SYNTHROID, LEVOTHROID) 50 MCG tablet Take 50 mcg by mouth daily before breakfast.    . Multiple Vitamins-Minerals (CENTRUM SILVER PO) Take 1 tablet by mouth daily.     Marland Kitchen oxyCODONE-acetaminophen (PERCOCET) 5-325 MG tablet Take 1 tablet by mouth every 6 (six) hours as needed for moderate pain. 8 tablet 0  . pantoprazole (PROTONIX) 40 MG tablet Take 40 mg by mouth daily.     . tamsulosin (FLOMAX) 0.4 MG CAPS capsule Take 0.4 mg by mouth daily after supper.     . carvedilol (COREG) 3.125 MG tablet Take 1 tablet (3.125 mg total) by mouth 2 (two) times daily. 60 tablet 11   No current facility-administered medications for this visit.    REVIEW OF SYSTEMS:   [X]  denotes positive finding, [ ]  denotes negative finding Cardiac  Comments:  Chest pain or chest pressure:    Shortness of breath upon exertion:    Short of breath when lying flat:    Irregular heart rhythm:    Constitutional    Fever or chills:      PHYSICAL EXAM:   Vitals:   10/22/20 0834 10/22/20 0836  BP: 109/63 110/68  Pulse: 62   Resp: 20   Temp: 98 F (36.7 C)   SpO2: 98%   Weight: 205 lb (93 kg)  Height: 6\' 3"  (1.905 m)     GENERAL: The patient is a well-nourished male, in no acute distress. The vital signs are documented above. CARDIOVASCULAR: There is a regular rate and rhythm. PULMONARY: Non-labored respirations Incisions are well-healed.  Neurologically intact  STUDIES:  Carotid duplex: Right Carotid: Patent stent with no evidence of stenosis in the right ICA.         The ECA appears >50% stenosed.   Left Carotid: Velocities in the left ICA are consistent with a 60-79%  stenosis.        The ECA appears >50% stenosed.   Vertebrals: Bilateral vertebral arteries demonstrate antegrade flow.  Subclavians: Normal flow hemodynamics were seen in bilateral subclavian        arteries.    MEDICAL ISSUES:    Status post right carotid endarterectomy.  The right carotid stent is widely patent.  He continues to have a high-grade lesion of the left as documented by CT scan.  I have told him to check his blood pressures when he has his episodes, as I suspect this is related to hypotension.  He is going to try and get this resolved.  He is not quite ready to have the other side done.  We have agreed with follow-up in 3 months.  I am stopping his Plavix today.  Leia Alf, MD, FACS [facility] [phone] Pager [phone]

## 2020-10-31 ENCOUNTER — Telehealth: Payer: Self-pay | Admitting: Gastroenterology

## 2020-10-31 ENCOUNTER — Other Ambulatory Visit: Payer: Self-pay

## 2020-10-31 DIAGNOSIS — R1314 Dysphagia, pharyngoesophageal phase: Secondary | ICD-10-CM

## 2020-10-31 DIAGNOSIS — K2270 Barrett's esophagus without dysplasia: Secondary | ICD-10-CM

## 2020-10-31 NOTE — Telephone Encounter (Signed)
Patient returned your call, please call patient one more time.   

## 2020-10-31 NOTE — Telephone Encounter (Signed)
Spoke with patient he has been scheduled for a pre-visit on Monday, 11/26/20 at 9 AM. Patient is scheduled for COVID test on Thursday, 12/06/20 at 10 AM. Patient is scheduled for an EGD at [facility] on Monday, 12/10/20 at 7:30 AM, pt is aware that he will need to arrive at 6 AM with a care partner. Pt is aware that Dr. Aundra Dubin has approved Eliquis hold for 2 evening prior. Patient verbalized understanding of all information and had no concerns at the end of the call. CASE ID: [id number]

## 2020-10-31 NOTE — Telephone Encounter (Signed)
Spoke with patient's wife, asked that she have patient give me a call when he returns.

## 2020-10-31 NOTE — Telephone Encounter (Signed)
[name],  I saw this patient in clinic 10/02/2020 for dysphagia and history of Barrett's esophagus.  He needs an upper endoscopy with me in the [place] outpatient endoscopy lab. I was waiting until he had follow-up with his vascular surgeon and a repeat echocardiogram, both of which were done recently.  I reviewed his echocardiogram report, and I also see that his vascular surgeon discontinued the Plavix.  I have 2 [facility] outpatient procedure blocks in March, and I would like the patient to come 1 of those 2 dates. Please contact you to make the arrangements  Regarding his Eliquis, I have also copied this to his cardiologist to make sure they are agreeable to refill holding that medicine before the procedure. I would plan for Eric Gordon to take his last dose of Eliquis 2 evenings prior to the procedure (thus being off the medicine about 36 hours).  ----------------------------------------------------  Dr. Aundra Dubin,  Please see above on this mutual patient and let us know your thoughts regarding the Eliquis. It looks like it was briefly held for his carotid stent placement several months ago.  [name]

## 2020-10-31 NOTE — Telephone Encounter (Signed)
I think at brief Eliquis hold would be ok.

## 2020-11-26 ENCOUNTER — Other Ambulatory Visit: Payer: Self-pay

## 2020-11-26 ENCOUNTER — Ambulatory Visit (AMBULATORY_SURGERY_CENTER): Payer: Non-veteran care | Admitting: *Deleted

## 2020-11-26 VITALS — Ht 75.0 in | Wt 210.0 lb

## 2020-11-26 DIAGNOSIS — R1314 Dysphagia, pharyngoesophageal phase: Secondary | ICD-10-CM

## 2020-11-26 DIAGNOSIS — K2270 Barrett's esophagus without dysplasia: Secondary | ICD-10-CM

## 2020-11-26 NOTE — Progress Notes (Addendum)
Patient is here in-person for PV. Patient denies any allergies to eggs or soy. Patient denies any problems with anesthesia/sedation. Patient denies any oxygen use at home. Patient denies taking any diet/weight loss medications. Pt aware when to stop blood thinner-eliquis, pt states he is not taking Plavix anymore. Patient is not being treated for MRSA or C-diff. Patient is aware of our care-partner policy and [id number] safety protocol.

## 2020-12-03 ENCOUNTER — Encounter (HOSPITAL_COMMUNITY): Payer: Self-pay | Admitting: Gastroenterology

## 2020-12-03 ENCOUNTER — Other Ambulatory Visit: Payer: Self-pay

## 2020-12-06 ENCOUNTER — Other Ambulatory Visit (HOSPITAL_COMMUNITY)
Admission: RE | Admit: 2020-12-06 | Discharge: 2020-12-06 | Disposition: A | Discharge status: Home / Self Care | Payer: Medicare Other | Source: Ambulatory Visit | Attending: Gastroenterology | Admitting: Gastroenterology

## 2020-12-06 DIAGNOSIS — Z20822 Contact with and (suspected) exposure to covid-19: Secondary | ICD-10-CM | POA: Insufficient documentation

## 2020-12-06 DIAGNOSIS — Z01812 Encounter for preprocedural laboratory examination: Secondary | ICD-10-CM | POA: Insufficient documentation

## 2020-12-06 LAB — SARS CORONAVIRUS 2 (TAT 6-24 HRS): SARS Coronavirus 2: NEGATIVE

## 2020-12-10 ENCOUNTER — Other Ambulatory Visit: Payer: Self-pay

## 2020-12-10 ENCOUNTER — Ambulatory Visit (HOSPITAL_COMMUNITY)
Admission: RE | Admit: 2020-12-10 | Discharge: 2020-12-10 | Disposition: A | Discharge status: Home / Self Care | Payer: No Typology Code available for payment source | Source: Ambulatory Visit | Attending: Gastroenterology | Admitting: Gastroenterology

## 2020-12-10 ENCOUNTER — Ambulatory Visit (HOSPITAL_COMMUNITY): Payer: No Typology Code available for payment source | Admitting: Anesthesiology

## 2020-12-10 ENCOUNTER — Encounter (HOSPITAL_COMMUNITY)
Admission: RE | Disposition: A | Discharge status: Home / Self Care | Payer: Self-pay | Source: Ambulatory Visit | Attending: Gastroenterology

## 2020-12-10 ENCOUNTER — Encounter (HOSPITAL_COMMUNITY): Payer: Self-pay | Admitting: Gastroenterology

## 2020-12-10 DIAGNOSIS — K449 Diaphragmatic hernia without obstruction or gangrene: Secondary | ICD-10-CM | POA: Diagnosis not present

## 2020-12-10 DIAGNOSIS — Z86711 Personal history of pulmonary embolism: Secondary | ICD-10-CM | POA: Diagnosis not present

## 2020-12-10 DIAGNOSIS — Z951 Presence of aortocoronary bypass graft: Secondary | ICD-10-CM | POA: Diagnosis not present

## 2020-12-10 DIAGNOSIS — R1313 Dysphagia, pharyngeal phase: Secondary | ICD-10-CM | POA: Diagnosis present

## 2020-12-10 DIAGNOSIS — Z881 Allergy status to other antibiotic agents status: Secondary | ICD-10-CM | POA: Insufficient documentation

## 2020-12-10 DIAGNOSIS — K317 Polyp of stomach and duodenum: Secondary | ICD-10-CM | POA: Diagnosis not present

## 2020-12-10 DIAGNOSIS — Z87891 Personal history of nicotine dependence: Secondary | ICD-10-CM | POA: Diagnosis not present

## 2020-12-10 HISTORY — PX: ESOPHAGOGASTRODUODENOSCOPY (EGD) WITH PROPOFOL: SHX5813

## 2020-12-10 SURGERY — ESOPHAGOGASTRODUODENOSCOPY (EGD) WITH PROPOFOL
Anesthesia: Monitor Anesthesia Care

## 2020-12-10 MED ORDER — EPHEDRINE SULFATE 50 MG/ML IJ SOLN
INTRAMUSCULAR | Status: DC | PRN
  Administered 2020-12-10 (×2): 10 mg via INTRAVENOUS

## 2020-12-10 MED ORDER — PROPOFOL 500 MG/50ML IV EMUL
INTRAVENOUS | Status: DC | PRN
  Administered 2020-12-10: 125 ug/kg/min via INTRAVENOUS

## 2020-12-10 MED ORDER — PROPOFOL 500 MG/50ML IV EMUL
INTRAVENOUS | Status: AC
  Filled 2020-12-10: qty 50

## 2020-12-10 MED ORDER — LACTATED RINGERS IV SOLN: INTRAVENOUS | Status: DC

## 2020-12-10 MED ORDER — PHENYLEPHRINE HCL (PRESSORS) 10 MG/ML IV SOLN
INTRAVENOUS | Status: DC | PRN
Start: 2020-12-10 — End: 2020-12-10
  Administered 2020-12-10 (×3): 80 ug via INTRAVENOUS
  Administered 2020-12-10: 120 ug via INTRAVENOUS

## 2020-12-10 MED ORDER — LIDOCAINE HCL 1 % IJ SOLN
INTRAMUSCULAR | Status: DC | PRN
  Administered 2020-12-10: 40 mg via INTRADERMAL

## 2020-12-10 MED ORDER — PROPOFOL 10 MG/ML IV BOLUS
INTRAVENOUS | Status: DC | PRN
  Administered 2020-12-10 (×2): 10 mg via INTRAVENOUS

## 2020-12-10 SURGICAL SUPPLY — 15 items

## 2020-12-10 NOTE — H&P (Addendum)
History:  This patient presents for endoscopic testing for dysphagia. See Jan 2022 [place] [facility] note for details Patient was later taken off plavix. Has held Eliquis 2 days before this procedure.  [name] Referring physician: Shon Baton, MD  Past Medical History: Past Medical History:  Diagnosis Date  . Abdominal aortic aneurysm (AAA) ([place])   . Achilles tendinitis of right lower extremity   . Arthritis    "my whole body" (03/19/2018)  . Atherosclerosis of coronary artery bypass graft w/o angina pectoris   . Atrial fibrillation ([place])   . Barrett's esophagus   . CAD (coronary artery disease)   . Carotid artery disease ([place])   . CHF (congestive heart failure) ([place])   . Chronic anticoagulation    PE  . Chronic lower back pain   . Dyspnea   . Essential tremor   . GERD (gastroesophageal reflux disease)   . High cholesterol   . Hyperlipidemia   . Hyperplasia, prostate   . Hypertension   . Hypothyroidism   . Myocardial infarction [facility])    2 in Jan. 2019  . Nail dystrophy   . OSA on CPAP    uses CPAP  . Osteoarthrosis   . Pneumonia    "now and once before" (03/19/2018)  . Pulmonary embolism [facility])    April 2012 after CABG  . S/P CABG (coronary artery bypass graft)   . Sleep apnea   . Spontaneous pneumothorax    left spontaneous pneumothorax/left pleural effusion, s/p chest tube 01/09/19; thoracentesis x2, s/p left VATS/tacl pleurodesis 04/12/19     Past Surgical History: Past Surgical History:  Procedure Laterality Date  . ACHILLES TENDON REPAIR Bilateral    2006 & 2004  . CARDIAC CATHETERIZATION  11/2010  . CORONARY ANGIOGRAPHY N/A 10/22/2018   Procedure: CORONARY ANGIOGRAPHY;  Surgeon: Troy Sine, MD;  Location: [place] CV LAB;  Service: Cardiovascular;  Laterality: N/A;  . CORONARY ARTERY BYPASS GRAFT  March 2012   CABG X 5  . INGUINAL HERNIA REPAIR Left 1980  . INTRAVASCULAR PRESSURE WIRE/FFR STUDY N/A 10/22/2018   Procedure: INTRAVASCULAR  PRESSURE WIRE/FFR STUDY;  Surgeon: Troy Sine, MD;  Location: [place] [facility];  Service: Cardiovascular;  Laterality: N/A;  . IR THORACENTESIS ASP PLEURAL SPACE W/IMG GUIDE  02/04/2019  . IR THORACENTESIS ASP PLEURAL SPACE W/IMG GUIDE  02/24/2019  . KNEE ARTHROSCOPY Left 2006  . LEFT HEART CATH AND CORS/GRAFTS ANGIOGRAPHY N/A 10/18/2018   Procedure: LEFT HEART CATH AND CORS/GRAFTS ANGIOGRAPHY;  Surgeon: Lorretta Harp, MD;  Location: [place] [facility];  Service: Cardiovascular;  Laterality: N/A;  . LEFT HEART CATH AND CORS/GRAFTS ANGIOGRAPHY N/A 10/21/2018   Procedure: LEFT HEART CATH AND CORS/GRAFTS ANGIOGRAPHY;  Surgeon: Troy Sine, MD;  Location: [place] [facility];  Service: Cardiovascular;  Laterality: N/A;  . PENILE PROSTHESIS IMPLANT    . PLEURAL BIOPSY Left 04/12/2019   Procedure: Pleural Biopsy;  Surgeon: Grace Isaac, MD;  Location: [place];  Service: Thoracic;  Laterality: Left;  . PLEURAL EFFUSION DRAINAGE Left 04/12/2019   Procedure: Drainage Of Pleural Effusion;  Surgeon: Grace Isaac, MD;  Location: [place];  Service: Thoracic;  Laterality: Left;  . RIGHT HEART CATHETERIZATION N/A 11/14/2013   Procedure: RIGHT HEART CATH;  Surgeon: Larey Dresser, MD;  Location: [facility] CATH LAB;  Service: Cardiovascular;  Laterality: N/A;  . SHOULDER OPEN ROTATOR CUFF REPAIR Right 2003  . TALC PLEURODESIS Left 04/12/2019   Procedure: TALC PLEURADESIS;  Surgeon: Grace Isaac, MD;  Location: [place];  Service: Thoracic;  Laterality: Left;  . TEE WITHOUT CARDIOVERSION N/A 03/24/2019   Procedure: TRANSESOPHAGEAL ECHOCARDIOGRAM (TEE);  Surgeon: Larey Dresser, MD;  Location: [facility] ENDOSCOPY;  Service: Cardiovascular;  Laterality: N/A;  . TEE WITHOUT CARDIOVERSION  04/12/2019   Procedure: Transesophageal Echocardiogram (Tee);  Surgeon: Grace Isaac, MD;  Location: [facility] OR;  Service: Thoracic;;  . THORACOSCOPY Left 04/12/2019   VIDEO BRONCHOSCOPY (N/A ) VIDEO ASSISTED THORACOSCOPY  (Left Chest) TALC PLEURADESIS (Left   . TRANSCAROTID ARTERY REVASCULARIZATION Right 09/13/2020   Procedure: RIGHT TRANSCAROTID ARTERY REVASCULARIZATION USING 41mm X 81mm ENROUTE TRANSCAROTID STEND SYSTEM;  Surgeon: Serafina Mitchell, MD;  Location: [place];  Service: Vascular;  Laterality: Right;  . ULTRASOUND GUIDANCE FOR VASCULAR ACCESS  09/13/2020   Procedure: ULTRASOUND GUIDANCE FOR VASCULAR ACCESS;  Surgeon: Serafina Mitchell, MD;  Location: [place];  Service: Vascular;;  . VIDEO ASSISTED THORACOSCOPY Left 04/12/2019   Procedure: VIDEO ASSISTED THORACOSCOPY;  Surgeon: Grace Isaac, MD;  Location: [place];  Service: Thoracic;  Laterality: Left;  Marland Kitchen VIDEO BRONCHOSCOPY N/A 04/12/2019   Procedure: VIDEO BRONCHOSCOPY;  Surgeon: Grace Isaac, MD;  Location: [facility] OR;  Service: Thoracic;  Laterality: N/A;    Allergies: Allergies  Allergen Reactions  . Ancef [Cefazolin Sodium] Hives  . Vancomycin Rash    Outpatient Meds: Current Facility-Administered Medications  Medication Dose Route Frequency Provider Last Rate Last Admin  . lactated ringers infusion   Intravenous Continuous Nelida Meuse III, MD 20 mL/hr at 12/10/20 0711 New Bag at 12/10/20 0711      ___________________________________________________________________ Objective   Exam:  BP (!) 156/88   Pulse 78   Temp 98.1 F (36.7 C) (Temporal)   Resp 20   Ht 6\' 3"  (1.905 m)   Wt 93 kg   SpO2 100%   BMI 25.62 kg/m  Comfortable, alert and conversational  CV: RRR with 3/6 systolic murmur, R9/Y5, no JVD, no peripheral edema  Resp: clear to auscultation bilaterally, normal RR and effort noted  GI: soft, no tenderness, with active bowel sounds. No guarding or palpable organomegaly noted.  Neuro: awake, alert and oriented x 3. Normal gross motor function and fluent speech   Assessment:  Pharygoesophageal dysphagia  Plan:  EGD with possible dilation  The benefits and risks of the planned procedure were described in  detail with the patient or (when appropriate) their health care proxy.  Risks were outlined as including, but not limited to, bleeding, infection, perforation, adverse medication reaction leading to cardiac or pulmonary decompensation, pancreatitis (if ERCP).  The limitation of incomplete mucosal visualization was also discussed.  No guarantees or warranties were given.   Nelida Meuse III

## 2020-12-10 NOTE — Anesthesia Postprocedure Evaluation (Signed)
Anesthesia Post Note  Patient: Eric Gordon  Procedure(s) Performed: ESOPHAGOGASTRODUODENOSCOPY (EGD) WITH PROPOFOL (N/A )     Patient location during evaluation: Endoscopy Anesthesia Type: MAC Level of consciousness: awake Pain management: pain level controlled Vital Signs Assessment: post-procedure vital signs reviewed and stable Respiratory status: spontaneous breathing Cardiovascular status: stable Postop Assessment: no apparent nausea or vomiting Anesthetic complications: no   No complications documented.  Last Vitals:  Vitals:   12/10/20 0802 12/10/20 0812  BP: (!) 114/45 (!) 123/47  Pulse: 61 66  Resp: 18 (!) 23  Temp:    SpO2: 100% 97%    Last Pain:  Vitals:   12/10/20 0812  TempSrc:   PainSc: 0-No pain                 Huston Foley

## 2020-12-10 NOTE — Transfer of Care (Signed)
Immediate Anesthesia Transfer of Care Note  Patient: Eric Gordon  Procedure(s) Performed: ESOPHAGOGASTRODUODENOSCOPY (EGD) WITH PROPOFOL (N/A )  Patient Location: PACU and Endoscopy Unit  Anesthesia Type:MAC  Level of Consciousness: awake, alert , oriented and patient cooperative  Airway & Oxygen Therapy: Patient Spontanous Breathing and Patient connected to face mask oxygen  Post-op Assessment: Report given to RN and Post -op Vital signs reviewed and stable  Post vital signs: Reviewed and stable  Last Vitals:  Vitals Value Taken Time  BP 125/59 12/10/20 0755  Temp 36.5 C 12/10/20 0752  Pulse 61 12/10/20 0757  Resp 23 12/10/20 0757  SpO2 100 % 12/10/20 0757  Vitals shown include unvalidated device data.  Last Pain:  Vitals:   12/10/20 0752  TempSrc:   PainSc: 0-No pain         Complications: No complications documented.

## 2020-12-10 NOTE — Anesthesia Preprocedure Evaluation (Signed)
Anesthesia Evaluation  Patient identified by MRN, date of birth, ID band Patient awake    Reviewed: Allergy & Precautions, NPO status , Patient's Chart, lab work & pertinent test results  Airway Mallampati: I  TM Distance: >3 FB Neck ROM: Full    Dental no notable dental hx.    Pulmonary asthma , sleep apnea and Continuous Positive Airway Pressure Ventilation , former smoker,    Pulmonary exam normal        Cardiovascular hypertension, Pt. on medications + CAD, + Past MI and + CABG  Normal cardiovascular exam+ dysrhythmias Atrial Fibrillation      Neuro/Psych negative psych ROS   GI/Hepatic GERD  Medicated and Controlled,  Endo/Other  Hypothyroidism   Renal/GU Renal InsufficiencyRenal disease  negative genitourinary   Musculoskeletal   Abdominal Normal abdominal exam  (+)   Peds  Hematology   Anesthesia Other Findings   Reproductive/Obstetrics                             Anesthesia Physical  Anesthesia Plan  ASA: III  Anesthesia Plan: MAC   Post-op Pain Management:    Induction:   PONV Risk Score and Plan: 2 and Ondansetron, Propofol infusion and Treatment may vary due to age or medical condition  Airway Management Planned: Natural Airway and Mask  Additional Equipment: None  Intra-op Plan:   Post-operative Plan:   Informed Consent: I have reviewed the patients History and Physical, chart, labs and discussed the procedure including the risks, benefits and alternatives for the proposed anesthesia with the patient or authorized representative who has indicated his/her understanding and acceptance.     Dental advisory given  Plan Discussed with: CRNA  Anesthesia Plan Comments: ( )        Anesthesia Quick Evaluation

## 2020-12-10 NOTE — Interval H&P Note (Signed)
History and Physical Interval Note:  12/10/2020 7:19 AM  Eric Gordon  has presented today for surgery, with the diagnosis of dysphagia, hx of Barrett's esophagus.  The various methods of treatment have been discussed with the patient and family. After consideration of risks, benefits and other options for treatment, the patient has consented to  Procedure(s): ESOPHAGOGASTRODUODENOSCOPY (EGD) WITH PROPOFOL (N/A) as a surgical intervention.  The patient's history has been reviewed, patient examined, no change in status, stable for surgery.  I have reviewed the patient's chart and labs.  Questions were answered to the patient's satisfaction.     Nelida Meuse III

## 2020-12-10 NOTE — Discharge Instructions (Signed)
Resume your Eliquis at usual dose this morning.  _____________________________________________________________________  YOU HAD AN ENDOSCOPIC PROCEDURE TODAY: Refer to the procedure report and other information in the discharge instructions given to you for any specific questions about what was found during the examination. If this information does not answer your questions, please call [place] office at [phone] to clarify.   YOU SHOULD EXPECT: Some feelings of bloating in the abdomen. Passage of more gas than usual. Walking can help get rid of the air that was put into your GI tract during the procedure and reduce the bloating. If you had a lower endoscopy (such as a colonoscopy or flexible sigmoidoscopy) you may notice spotting of blood in your stool or on the toilet paper. Some abdominal soreness may be present for a day or two, also.  DIET: Your first meal following the procedure should be a light meal and then it is ok to progress to your normal diet. A half-sandwich or bowl of soup is an example of a good first meal. Heavy or fried foods are harder to digest and may make you feel nauseous or bloated. Drink plenty of fluids but you should avoid alcoholic beverages for 24 hours. If you had a esophageal dilation, please see attached instructions for diet.    ACTIVITY: Your care partner should take you home directly after the procedure. You should plan to take it easy, moving slowly for the rest of the day. You can resume normal activity the day after the procedure however YOU SHOULD NOT DRIVE, use power tools, machinery or perform tasks that involve climbing or major physical exertion for 24 hours (because of the sedation medicines used during the test).   SYMPTOMS TO REPORT IMMEDIATELY: A gastroenterologist can be reached at any hour. Please call [phone]  for any of the following symptoms:   . Following upper endoscopy (EGD, EUS, ERCP, esophageal dilation) Vomiting of blood or coffee  ground material  New, significant abdominal pain  New, significant chest pain or pain under the shoulder blades  Painful or persistently difficult swallowing  New shortness of breath  Black, tarry-looking or red, bloody stools  FOLLOW UP:  If any biopsies were taken you will be contacted by phone or by letter within the next 1-3 weeks. Call [phone]  if you have not heard about the biopsies in 3 weeks.  Please also call with any specific questions about appointments or follow up tests.

## 2020-12-10 NOTE — Op Note (Signed)
[facility] Patient Name: Eric Gordon Procedure Date: 12/10/2020 MRN: [id number] Attending MD: Estill Cotta. Loletha Carrow , MD Date of Birth: 1931/06/18 CSN: [id number] Age: 87 Admit Type: Outpatient Procedure:                Upper GI endoscopy Indications:              Pharyngeal phase dysphagia Providers:                Mallie Mussel L. Loletha Carrow, MD, Cleda Daub, RN, Tyna Jaksch Technician Referring MD:             Shon Baton MD Medicines:                Monitored Anesthesia Care Complications:            No immediate complications. Estimated Blood Loss:     Estimated blood loss: none. Procedure:                Pre-Anesthesia Assessment:                           - Prior to the procedure, a History and Physical                            was performed, and patient medications and                            allergies were reviewed. The patient's tolerance of                            previous anesthesia was also reviewed. The risks                            and benefits of the procedure and the sedation                            options and risks were discussed with the patient.                            All questions were answered, and informed consent                            was obtained. Prior Anticoagulants: The patient has                            taken Eliquis (apixaban), last dose was 2 days                            prior to procedure. ASA Grade Assessment: III - A                            patient with severe systemic disease. After  reviewing the risks and benefits, the patient was                            deemed in satisfactory condition to undergo the                            procedure.                           After obtaining informed consent, the endoscope was                            passed under direct vision. Throughout the                            procedure, the patient's blood pressure,  pulse, and                            oxygen saturations were monitored continuously. The                            GIF-H190 ([id number]) Olympus gastroscope was                            introduced through the mouth, and advanced to the                            second part of duodenum. The upper GI endoscopy was                            accomplished without difficulty. The patient                            tolerated the procedure well. Scope In: Scope Out: Findings:      The larynx was normal.      A 1 cm hiatal hernia was present, and the LES was somewhat patulous.      There is no endoscopic evidence of Barrett's esophagus, esophagitis,       stricture or mass in the entire esophagus.      Multiple small sessile fundic gland polyps were found in the gastric       fundus and in the gastric body.      The exam of the stomach was otherwise normal, including on retroflexion.      The examined duodenum was normal. Impression:               - Normal larynx.                           - 1 cm hiatal hernia.                           - Multiple fundic gland polyps.                           - Normal examined duodenum.                           -  No specimens collected. Moderate Sedation:      MAC sedation used Recommendation:           - Patient has a contact number available for                            emergencies. The signs and symptoms of potential                            delayed complications were discussed with the                            patient. Return to normal activities tomorrow.                            Written discharge instructions were provided to the                            patient.                           - Resume previous diet.                           - Resume Eliquis (apixaban) at prior dose today.                           - Meat should be ground whenever possible.                           Cut and chew food well, pre-moisten with beverage                             before swallowing, employ chin-tuck maneuver with                            swallowing.                           - Return to my office PRN. Procedure Code(s):        --- Professional ---                           [phone], Esophagogastroduodenoscopy, flexible,                            transoral; diagnostic, including collection of                            specimen(s) by brushing or washing, when performed                            (separate procedure) Diagnosis Code(s):        --- Professional ---                           K44.9, Diaphragmatic hernia without obstruction or  gangrene                           K31.7, Polyp of stomach and duodenum                           R13.13, Dysphagia, pharyngeal phase CPT copyright 2019 [employer]. All rights reserved. The codes documented in this report are preliminary and upon coder review may  be revised to meet current compliance requirements. Vikas Wegmann L. Loletha Carrow, MD 12/10/2020 7:51:17 AM This report has been signed electronically. Number of Addenda: 0

## 2020-12-12 ENCOUNTER — Encounter (HOSPITAL_COMMUNITY): Payer: Self-pay | Admitting: Gastroenterology

## 2020-12-27 DIAGNOSIS — M5416 Radiculopathy, lumbar region: Secondary | ICD-10-CM | POA: Diagnosis not present

## 2021-01-04 DIAGNOSIS — S83242D Other tear of medial meniscus, current injury, left knee, subsequent encounter: Secondary | ICD-10-CM | POA: Diagnosis not present

## 2021-01-04 DIAGNOSIS — S83272D Complex tear of lateral meniscus, current injury, left knee, subsequent encounter: Secondary | ICD-10-CM | POA: Diagnosis not present

## 2021-01-04 DIAGNOSIS — M1712 Unilateral primary osteoarthritis, left knee: Secondary | ICD-10-CM | POA: Diagnosis not present

## 2021-01-14 ENCOUNTER — Ambulatory Visit (INDEPENDENT_AMBULATORY_CARE_PROVIDER_SITE_OTHER): Payer: Medicare Other | Admitting: Surgery

## 2021-01-14 ENCOUNTER — Encounter: Payer: Self-pay | Admitting: Surgery

## 2021-01-14 ENCOUNTER — Other Ambulatory Visit: Payer: Self-pay

## 2021-01-14 VITALS — BP 102/62 | HR 60 | Temp 98.0°F | Resp 20 | Ht 75.0 in | Wt 206.0 lb

## 2021-01-14 DIAGNOSIS — I6523 Occlusion and stenosis of bilateral carotid arteries: Secondary | ICD-10-CM

## 2021-01-14 MED ORDER — CLOPIDOGREL BISULFATE 75 MG PO TABS: 75.0000 mg | ORAL_TABLET | Freq: Every day | ORAL | 11 refills | Status: DC

## 2021-01-14 NOTE — H&P (View-Only) (Signed)
V[facility]in Specialist of [place]  Patient name: Eric Gordon MRN: [id number] DOB: 1931/09/08 Sex: male   REASON FOR VISIT:    Follow up  HISOTRY OF PRESENT ILLNESS:    IRL BODIE is a 88 y.o. male with asymptomatic bilateral carotid stenosis, documented by CT scan.  On 09/13/2020 he underwent right sided TCAR.  Intraoperative findings included a 90% stenosis.  He is back to discuss plans for his left carotid stenosis.  He reports some blurry vision that usually occurs in the morning.  It involves both eyes.  He usually takes an hour nap and it is gone for the rest of the day.   Patient has a history of coronary artery disease status post CABG. He has had a NSTEMI from occluded bypass grafts. He has atrial fibrillation. He is anticoagulated on Eliquis. He has had a spontaneous pneumothorax requiring chest tube decompression he gets dyspneic at 150 to 200 yards. He is medically managed for hypertension. He takes a statin for hypercholesterolemia. He has a known abdominal aortic aneurysm with ultrasound on 06/08/2020 showing maximal diameter 4.1 cm.   PAST MEDICAL HISTORY:   Past Medical History:  Diagnosis Date  . Abdominal aortic aneurysm (AAA) ([place])   . Achilles tendinitis of right lower extremity   . Arthritis    "my whole body" (03/19/2018)  . Atherosclerosis of coronary artery bypass graft w/o angina pectoris   . Atrial fibrillation ([place])   . Barrett's esophagus   . CAD (coronary artery disease)   . Carotid artery disease ([place])   . CHF (congestive heart failure) ([place])   . Chronic anticoagulation    PE  . Chronic lower back pain   . Dyspnea   . Essential tremor   . GERD (gastroesophageal reflux disease)   . High cholesterol   . Hyperlipidemia   . Hyperplasia, prostate   . Hypertension   . Hypothyroidism   . Myocardial infarction [facility])    2 in Jan. 2019  . Nail dystrophy   . OSA on CPAP    uses  CPAP  . Osteoarthrosis   . Pneumonia    "now and once before" (03/19/2018)  . Pulmonary embolism [facility])    April 2012 after CABG  . S/P CABG (coronary artery bypass graft)   . Sleep apnea   . Spontaneous pneumothorax    left spontaneous pneumothorax/left pleural effusion, s/p chest tube 01/09/19; thoracentesis x2, s/p left VATS/tacl pleurodesis 04/12/19     FAMILY HISTORY:   Family History  Problem Relation Age of Onset  . Coronary artery disease Mother   . Hypertension Mother   . Heart disease Mother     SOCIAL HISTORY:   Social History   Tobacco Use  . Smoking status: Former Smoker    Packs/day: 1.00    Years: 35.00    Pack years: 35.00    Types: Cigarettes    Quit date: 02/18/1984    Years since quitting: 36.9  . Smokeless tobacco: Never Used  Substance Use Topics  . Alcohol use: Yes    Alcohol/week: 2.0 standard drinks    Types: 2 Standard drinks or equivalent per week     ALLERGIES:   Allergies  Allergen Reactions  . Ancef [Cefazolin Sodium] Hives  . Vancomycin Rash     CURRENT MEDICATIONS:   Current Outpatient Medications  Medication Sig Dispense Refill  . acetaminophen (TYLENOL) 500 MG tablet Take 1,000 mg by mouth every 6 (six) hours as needed for moderate pain  or headache.    Marland Kitchen apixaban (ELIQUIS) 5 MG TABS tablet Take 1 tablet (5 mg total) by mouth 2 (two) times daily. 60 tablet 5  . Ascorbic Acid 500 MG CAPS Take 500 mg by mouth daily.    Marland Kitchen atorvastatin (LIPITOR) 80 MG tablet Take 1 tablet (80 mg total) by mouth daily. 30 tablet 6  . cetirizine (ZYRTEC) 10 MG tablet Take 10 mg by mouth daily.    . cholecalciferol (VITAMIN D) 1000 UNITS tablet Take 1,000 Units by mouth every evening.    . furosemide (LASIX) 40 MG tablet Take 40 mg by mouth daily.    . isosorbide mononitrate (IMDUR) 30 MG 24 hr tablet Take 2 tablets (60 mg total) by mouth daily.    Marland Kitchen levothyroxine (SYNTHROID, LEVOTHROID) 50 MCG tablet Take 50 mcg by mouth daily before breakfast.    .  Multiple Vitamins-Minerals (CENTRUM SILVER PO) Take 1 tablet by mouth daily.     . pantoprazole (PROTONIX) 40 MG tablet Take 40 mg by mouth daily.     . tamsulosin (FLOMAX) 0.4 MG CAPS capsule Take 0.4 mg by mouth daily after supper.     . carvedilol (COREG) 3.125 MG tablet Take 1 tablet (3.125 mg total) by mouth 2 (two) times daily. 60 tablet 11   No current facility-administered medications for this visit.    REVIEW OF SYSTEMS:   [X]  denotes positive finding, [ ]  denotes negative finding Cardiac  Comments:  Chest pain or chest pressure:    Shortness of breath upon exertion:    Short of breath when lying flat:    Irregular heart rhythm:        Vascular    Pain in calf, thigh, or hip brought on by ambulation:    Pain in feet at night that wakes you up from your sleep:     Blood clot in your veins:    Leg swelling:         Pulmonary    Oxygen at home:    Productive cough:     Wheezing:         Neurologic    Sudden weakness in arms or legs:     Sudden numbness in arms or legs:     Sudden onset of difficulty speaking or slurred speech:    Temporary loss of vision in one eye:     Problems with dizziness:         Gastrointestinal    Blood in stool:     Vomited blood:         Genitourinary    Burning when urinating:     Blood in urine:        Psychiatric    Major depression:         Hematologic    Bleeding problems:    Problems with blood clotting too easily:        Skin    Rashes or ulcers:        Constitutional    Fever or chills:      PHYSICAL EXAM:   Vitals:   01/14/21 0845 01/14/21 0847  BP: 103/66 102/62  Pulse: 60   Resp: 20   Temp: 98 F (36.7 C)   SpO2: 94%   Weight: 206 lb (93.4 kg)   Height: 6\' 3"  (1.905 m)     GENERAL: The patient is a well-nourished male, in no acute distress. The vital signs are documented above. CARDIAC: There is a regular rate and rhythm.  PULMONARY: Non-labored respirations MUSCULOSKELETAL: There are no major  deformities or cyanosis. NEUROLOGIC: No focal weakness or paresthesias are detected. SKIN: There are no ulcers or rashes noted. PSYCHIATRIC: The patient has a normal affect.  STUDIES:   I have reviewed his CT scan with the following findings: 1. Prominent atherosclerotic changes of the right carotid bifurcation with soft and calcified plaques resulting approximately 90% stenosis. A small plaque ulceration is also noted. 2. Atherosclerotic changes in the left carotid bifurcation with soft and calcified plaque resulting in approximately 85% stenosis. MEDICAL ISSUES:   Asymptomatic left carotid stenosis: The patient has previously undergone right-sided TCAR.  He has completely recovered from this and is back to discuss the left side.  Because of his age and comorbidities, I feel TCAR is indicated.  We discussed the details of the procedure as well as the risk of stroke and bleeding.  I will stop his Eliquis prior to his procedure.  He will stop Plavix 5 days prior to the procedure and continue it for 1 month post procedure.  He is going on the flight of honor this Wednesday.  This procedure is been scheduled for May 20    Leia Alf, MD, FACS [facility] Specialists [facility] [place] [phone] Pager [phone]

## 2021-01-14 NOTE — Progress Notes (Signed)
 Vascular and Vein Specialist of [place]  Patient name: Eric Gordon MRN: [id number] DOB: 03/02/1931 Sex: male   REASON FOR VISIT:    Follow up  HISOTRY OF PRESENT ILLNESS:    Eric Gordon is a 87 y.o. male with asymptomatic bilateral carotid stenosis, documented by CT scan.  On 09/13/2020 he underwent right sided TCAR.  Intraoperative findings included a 90% stenosis.  He is back to discuss plans for his left carotid stenosis.  He reports some blurry vision that usually occurs in the morning.  It involves both eyes.  He usually takes an hour nap and it is gone for the rest of the day.   Patient has a history of coronary artery disease status post CABG. He has had a NSTEMI from occluded bypass grafts. He has atrial fibrillation. He is anticoagulated on Eliquis. He has had a spontaneous pneumothorax requiring chest tube decompression he gets dyspneic at 150 to 200 yards. He is medically managed for hypertension. He takes a statin for hypercholesterolemia. He has a known abdominal aortic aneurysm with ultrasound on 06/08/2020 showing maximal diameter 4.1 cm.   PAST MEDICAL HISTORY:   Past Medical History:  Diagnosis Date  . Abdominal aortic aneurysm (AAA) ([facility])   . Achilles tendinitis of right lower extremity   . Arthritis    "my whole body" (03/19/2018)  . Atherosclerosis of coronary artery bypass graft w/o angina pectoris   . Atrial fibrillation ([facility])   . Barrett's esophagus   . CAD (coronary artery disease)   . Carotid artery disease ([facility])   . CHF (congestive heart failure) ([facility])   . Chronic anticoagulation    PE  . Chronic lower back pain   . Dyspnea   . Essential tremor   . GERD (gastroesophageal reflux disease)   . High cholesterol   . Hyperlipidemia   . Hyperplasia, prostate   . Hypertension   . Hypothyroidism   . Myocardial infarction ([facility])    2 in Jan. 2019  . Nail dystrophy   . OSA on CPAP    uses  CPAP  . Osteoarthrosis   . Pneumonia    "now and once before" (03/19/2018)  . Pulmonary embolism ([facility])    April 2012 after CABG  . S/P CABG (coronary artery bypass graft)   . Sleep apnea   . Spontaneous pneumothorax    left spontaneous pneumothorax/left pleural effusion, s/p chest tube 01/09/19; thoracentesis x2, s/p left VATS/tacl pleurodesis 04/12/19     FAMILY HISTORY:   Family History  Problem Relation Age of Onset  . Coronary artery disease Mother   . Hypertension Mother   . Heart disease Mother     SOCIAL HISTORY:   Social History   Tobacco Use  . Smoking status: Former Smoker    Packs/day: 1.00    Years: 35.00    Pack years: 35.00    Types: Cigarettes    Quit date: 02/18/1984    Years since quitting: 36.9  . Smokeless tobacco: Never Used  Substance Use Topics  . Alcohol use: Yes    Alcohol/week: 2.0 standard drinks    Types: 2 Standard drinks or equivalent per week     ALLERGIES:   Allergies  Allergen Reactions  . Ancef [Cefazolin Sodium] Hives  . Vancomycin Rash     CURRENT MEDICATIONS:   Current Outpatient Medications  Medication Sig Dispense Refill  . acetaminophen (TYLENOL) 500 MG tablet Take 1,000 mg by mouth every 6 (six) hours as needed for moderate pain   or headache.    Marland Kitchen apixaban (ELIQUIS) 5 MG TABS tablet Take 1 tablet (5 mg total) by mouth 2 (two) times daily. 60 tablet 5  . Ascorbic Acid 500 MG CAPS Take 500 mg by mouth daily.    Marland Kitchen atorvastatin (LIPITOR) 80 MG tablet Take 1 tablet (80 mg total) by mouth daily. 30 tablet 6  . cetirizine (ZYRTEC) 10 MG tablet Take 10 mg by mouth daily.    . cholecalciferol (VITAMIN D) 1000 UNITS tablet Take 1,000 Units by mouth every evening.    . furosemide (LASIX) 40 MG tablet Take 40 mg by mouth daily.    . isosorbide mononitrate (IMDUR) 30 MG 24 hr tablet Take 2 tablets (60 mg total) by mouth daily.    Marland Kitchen levothyroxine (SYNTHROID, LEVOTHROID) 50 MCG tablet Take 50 mcg by mouth daily before breakfast.    .  Multiple Vitamins-Minerals (CENTRUM SILVER PO) Take 1 tablet by mouth daily.     . pantoprazole (PROTONIX) 40 MG tablet Take 40 mg by mouth daily.     . tamsulosin (FLOMAX) 0.4 MG CAPS capsule Take 0.4 mg by mouth daily after supper.     . carvedilol (COREG) 3.125 MG tablet Take 1 tablet (3.125 mg total) by mouth 2 (two) times daily. 60 tablet 11   No current facility-administered medications for this visit.    REVIEW OF SYSTEMS:   [X]  denotes positive finding, [ ]  denotes negative finding Cardiac  Comments:  Chest pain or chest pressure:    Shortness of breath upon exertion:    Short of breath when lying flat:    Irregular heart rhythm:        Vascular    Pain in calf, thigh, or hip brought on by ambulation:    Pain in feet at night that wakes you up from your sleep:     Blood clot in your veins:    Leg swelling:         Pulmonary    Oxygen at home:    Productive cough:     Wheezing:         Neurologic    Sudden weakness in arms or legs:     Sudden numbness in arms or legs:     Sudden onset of difficulty speaking or slurred speech:    Temporary loss of vision in one eye:     Problems with dizziness:         Gastrointestinal    Blood in stool:     Vomited blood:         Genitourinary    Burning when urinating:     Blood in urine:        Psychiatric    Major depression:         Hematologic    Bleeding problems:    Problems with blood clotting too easily:        Skin    Rashes or ulcers:        Constitutional    Fever or chills:      PHYSICAL EXAM:   Vitals:   01/14/21 0845 01/14/21 0847  BP: 103/66 102/62  Pulse: 60   Resp: 20   Temp: 98 F (36.7 C)   SpO2: 94%   Weight: 206 lb (93.4 kg)   Height: 6\' 3"  (1.905 m)     GENERAL: The patient is a well-nourished male, in no acute distress. The vital signs are documented above. CARDIAC: There is a regular rate and rhythm.  PULMONARY: Non-labored respirations MUSCULOSKELETAL: There are no major  deformities or cyanosis. NEUROLOGIC: No focal weakness or paresthesias are detected. SKIN: There are no ulcers or rashes noted. PSYCHIATRIC: The patient has a normal affect.  STUDIES:   I have reviewed his CT scan with the following findings: 1. Prominent atherosclerotic changes of the right carotid bifurcation with soft and calcified plaques resulting approximately 90% stenosis. A small plaque ulceration is also noted. 2. Atherosclerotic changes in the left carotid bifurcation with soft and calcified plaque resulting in approximately 85% stenosis. MEDICAL ISSUES:   Asymptomatic left carotid stenosis: The patient has previously undergone right-sided TCAR.  He has completely recovered from this and is back to discuss the left side.  Because of his age and comorbidities, I feel TCAR is indicated.  We discussed the details of the procedure as well as the risk of stroke and bleeding.  I will stop his Eliquis prior to his procedure.  He will stop Plavix 5 days prior to the procedure and continue it for 1 month post procedure.  He is going on the flight of honor this Wednesday.  This procedure is been scheduled for May 20    Wells Esty Ahuja, IV, MD, FACS [facility] of [place] Tel [phone] Pager [phone] 

## 2021-01-15 ENCOUNTER — Other Ambulatory Visit: Payer: Self-pay

## 2021-01-21 ENCOUNTER — Other Ambulatory Visit (HOSPITAL_COMMUNITY): Payer: Self-pay

## 2021-01-21 MED ORDER — PAXLOVID 20 X 150 MG & 10 X 100MG PO TBPK
1.0000 | ORAL_TABLET | Freq: Two times a day (BID) | ORAL | 0 refills | Status: DC
  Filled 2021-01-21: qty 10, 5d supply, fill #0

## 2021-01-22 ENCOUNTER — Other Ambulatory Visit (HOSPITAL_COMMUNITY): Payer: Self-pay

## 2021-01-29 DIAGNOSIS — Z125 Encounter for screening for malignant neoplasm of prostate: Secondary | ICD-10-CM | POA: Diagnosis not present

## 2021-01-29 DIAGNOSIS — E1122 Type 2 diabetes mellitus with diabetic chronic kidney disease: Secondary | ICD-10-CM | POA: Diagnosis not present

## 2021-01-29 DIAGNOSIS — N1831 Chronic kidney disease, stage 3a: Secondary | ICD-10-CM | POA: Diagnosis not present

## 2021-01-29 DIAGNOSIS — E785 Hyperlipidemia, unspecified: Secondary | ICD-10-CM | POA: Diagnosis not present

## 2021-01-29 DIAGNOSIS — E039 Hypothyroidism, unspecified: Secondary | ICD-10-CM | POA: Diagnosis not present

## 2021-01-30 NOTE — Pre-Procedure Instructions (Signed)
Surgical Instructions:    Your procedure is scheduled on Friday 02/08/21 (07:30 AM- 09:30 AM).  Report to [facility] Main Entrance "A" at 05:30 A.M., then check in with the Admitting office.  Call this number if you have any questions prior to, or have any problems the morning of surgery:  [phone]    Remember:  Do not eat or drink after midnight the night before your surgery.    Take these medicines the morning of surgery with A SIP OF WATER: atorvastatin (LIPITOR) carvedilol (COREG)  cetirizine (ZYRTEC)  isosorbide mononitrate (IMDUR)  levothyroxine (SYNTHROID, LEVOTHROID) pantoprazole (PROTONIX)  IF NEEDED: acetaminophen (TYLENOL)  >>Per your surgeon's instructions, stop clopidogrel (PLAVIX) 5 days prior to surgery.<<  Follow your surgeon's instructions concerning apixaban (ELIQUIS).   If no instructions were given by your surgeon then you will need to call the office to get those instructions.     As of today, STOP taking any Aspirin (unless otherwise instructed by your surgeon) Aleve, Naproxen, Ibuprofen, Motrin, Advil, Goody's, BC's, all herbal medications, fish oil, and all vitamins.              Special instructions:   [place]- Preparing For Surgery  Before surgery, you can play an important role. Because skin is not sterile, your skin needs to be as free of germs as possible. You can reduce the number of germs on your skin by washing with CHG (chlorahexidine gluconate) Soap before surgery.  CHG is an antiseptic cleaner which kills germs and bonds with the skin to continue killing germs even after washing.    Oral Hygiene is also important to reduce your risk of infection.  Remember - BRUSH YOUR TEETH THE MORNING OF SURGERY WITH YOUR REGULAR TOOTHPASTE  Please do not use if you have an allergy to CHG or antibacterial soaps. If your skin becomes reddened/irritated stop using the CHG.  Do not shave (including legs and underarms) for at least 48 hours prior to  first CHG shower. It is OK to shave your face.  Please follow these instructions carefully.   1. Shower the NIGHT BEFORE SURGERY and the MORNING OF SURGERY  2. If you chose to wash your hair, wash your hair first as usual with your normal shampoo.  3. After you shampoo, rinse your hair and body thoroughly to remove the shampoo.  4. Wash Face and genitals (private parts) with your normal soap.   5. Use CHG Soap as you would any other liquid soap. You can apply CHG directly to the skin and wash gently with a scrungie or a clean washcloth.   6. Apply the CHG Soap to your body ONLY FROM THE NECK DOWN.  Do not use on open wounds or open sores. Avoid contact with your eyes, ears, mouth and genitals (private parts). Wash Face and genitals (private parts)  with your normal soap.   7. Wash thoroughly, paying special attention to the area where your surgery will be performed.  8. Thoroughly rinse your body with warm water from the neck down.  9. DO NOT shower/wash with your normal soap after using and rinsing off the CHG Soap.  10. Pat yourself dry with a CLEAN TOWEL.  11. Wear CLEAN PAJAMAS to bed the night before surgery.  12. Place CLEAN SHEETS on your bed the night before your surgery.  13. DO NOT SLEEP WITH PETS.   Day of Surgery: SHOWER with CHG soap. Brush your teeth WITH YOUR REGULAR TOOTHPASTE. Wear Clean/Comfortable clothing the morning  of surgery. Do not apply any deodorants/lotions.   Do not wear jewelry. Do not shave 48 hours prior to surgery.  Men may shave face and neck.  Do NOT Smoke (Tobacco/Vaping) or drink Alcohol 24 hours prior to your procedure. Do not bring valuables to the hospital. [facility] is not responsible for any belongings or valuables.  If you use a CPAP at night, you may bring all equipment for your overnight stay.   Contacts, glasses, or dentures may not be worn into surgery, please bring cases for these belongings.   For patients admitted to the  hospital, discharge time will be determined by your treatment team.   Patients discharged the day of surgery will not be allowed to drive home, and someone needs to stay with them for 24 hours.    Please read over the following fact sheets that you were given.

## 2021-01-31 ENCOUNTER — Encounter (HOSPITAL_COMMUNITY): Payer: Self-pay

## 2021-01-31 ENCOUNTER — Other Ambulatory Visit: Payer: Self-pay

## 2021-01-31 ENCOUNTER — Encounter (HOSPITAL_COMMUNITY)
Admission: RE | Admit: 2021-01-31 | Discharge: 2021-01-31 | Disposition: A | Discharge status: Home / Self Care | Payer: Medicare Other | Source: Ambulatory Visit | Attending: Surgery | Admitting: Surgery

## 2021-01-31 DIAGNOSIS — I480 Paroxysmal atrial fibrillation: Secondary | ICD-10-CM | POA: Insufficient documentation

## 2021-01-31 DIAGNOSIS — Z79899 Other long term (current) drug therapy: Secondary | ICD-10-CM | POA: Insufficient documentation

## 2021-01-31 DIAGNOSIS — Z8616 Personal history of COVID-19: Secondary | ICD-10-CM | POA: Diagnosis not present

## 2021-01-31 DIAGNOSIS — I700 Atherosclerosis of aorta: Secondary | ICD-10-CM | POA: Diagnosis not present

## 2021-01-31 DIAGNOSIS — Z7902 Long term (current) use of antithrombotics/antiplatelets: Secondary | ICD-10-CM | POA: Insufficient documentation

## 2021-01-31 DIAGNOSIS — I252 Old myocardial infarction: Secondary | ICD-10-CM | POA: Diagnosis not present

## 2021-01-31 DIAGNOSIS — J90 Pleural effusion, not elsewhere classified: Secondary | ICD-10-CM | POA: Diagnosis not present

## 2021-01-31 DIAGNOSIS — I110 Hypertensive heart disease with heart failure: Secondary | ICD-10-CM | POA: Insufficient documentation

## 2021-01-31 DIAGNOSIS — I2510 Atherosclerotic heart disease of native coronary artery without angina pectoris: Secondary | ICD-10-CM | POA: Diagnosis not present

## 2021-01-31 DIAGNOSIS — Z7901 Long term (current) use of anticoagulants: Secondary | ICD-10-CM | POA: Diagnosis not present

## 2021-01-31 DIAGNOSIS — G4733 Obstructive sleep apnea (adult) (pediatric): Secondary | ICD-10-CM | POA: Diagnosis not present

## 2021-01-31 DIAGNOSIS — G8929 Other chronic pain: Secondary | ICD-10-CM | POA: Diagnosis not present

## 2021-01-31 DIAGNOSIS — I6523 Occlusion and stenosis of bilateral carotid arteries: Secondary | ICD-10-CM | POA: Diagnosis not present

## 2021-01-31 DIAGNOSIS — Z951 Presence of aortocoronary bypass graft: Secondary | ICD-10-CM | POA: Diagnosis not present

## 2021-01-31 DIAGNOSIS — K219 Gastro-esophageal reflux disease without esophagitis: Secondary | ICD-10-CM | POA: Diagnosis not present

## 2021-01-31 DIAGNOSIS — M549 Dorsalgia, unspecified: Secondary | ICD-10-CM | POA: Insufficient documentation

## 2021-01-31 DIAGNOSIS — Z01812 Encounter for preprocedural laboratory examination: Secondary | ICD-10-CM | POA: Insufficient documentation

## 2021-01-31 DIAGNOSIS — I5032 Chronic diastolic (congestive) heart failure: Secondary | ICD-10-CM | POA: Diagnosis not present

## 2021-01-31 DIAGNOSIS — Z87891 Personal history of nicotine dependence: Secondary | ICD-10-CM | POA: Insufficient documentation

## 2021-01-31 DIAGNOSIS — E039 Hypothyroidism, unspecified: Secondary | ICD-10-CM | POA: Diagnosis not present

## 2021-01-31 LAB — CBC
HCT: 43.4 % (ref 39.0–52.0)
Hemoglobin: 13.6 g/dL (ref 13.0–17.0)
MCH: 30.6 pg (ref 26.0–34.0)
MCHC: 31.3 g/dL (ref 30.0–36.0)
MCV: 97.5 fL (ref 80.0–100.0)
Platelets: 185 10*3/uL (ref 150–400)
RBC: 4.45 MIL/uL (ref 4.22–5.81)
RDW: 13.8 % (ref 11.5–15.5)
WBC: 7.2 10*3/uL (ref 4.0–10.5)
nRBC: 0 % (ref 0.0–0.2)

## 2021-01-31 LAB — COMPREHENSIVE METABOLIC PANEL
ALT: 14 U/L (ref 0–44)
AST: 21 U/L (ref 15–41)
Albumin: 3.5 g/dL (ref 3.5–5.0)
Alkaline Phosphatase: 91 U/L (ref 38–126)
Anion gap: 8 (ref 5–15)
BUN: 19 mg/dL (ref 8–23)
CO2: 30 mmol/L (ref 22–32)
Calcium: 9.3 mg/dL (ref 8.9–10.3)
Chloride: 104 mmol/L (ref 98–111)
Creatinine, Ser: 1.22 mg/dL (ref 0.61–1.24)
GFR, Estimated: 57 mL/min — ABNORMAL LOW (ref >60)
Glucose, Bld: 103 mg/dL — ABNORMAL HIGH (ref 70–99)
Potassium: 4.3 mmol/L (ref 3.5–5.1)
Sodium: 142 mmol/L (ref 135–145)
Total Bilirubin: 1 mg/dL (ref 0.3–1.2)
Total Protein: 6.5 g/dL (ref 6.5–8.1)

## 2021-01-31 LAB — URINALYSIS, ROUTINE W REFLEX MICROSCOPIC
Bacteria, UA: NONE SEEN
Bilirubin Urine: NEGATIVE
Glucose, UA: NEGATIVE mg/dL
Ketones, ur: NEGATIVE mg/dL
Leukocytes,Ua: NEGATIVE
Nitrite: NEGATIVE
Protein, ur: NEGATIVE mg/dL
Specific Gravity, Urine: 1.011 (ref 1.005–1.030)
pH: 5 (ref 5.0–8.0)

## 2021-01-31 LAB — TYPE AND SCREEN
ABO/RH(D): A POS
Antibody Screen: NEGATIVE

## 2021-01-31 LAB — PLATELET MAPPING ADP AND AA
AA Aggregation: 99.1 % (ref 89–100)
AA Inhibition: 0.9 % (ref 0–11)
ADP Aggregation: 99.8 % (ref 83–100)
ADP Inhibition: 0.2 % (ref 0–17)
Activator F: 17.8 mm (ref 2–19)
Adenosine 5 Diphosphate (MA): 64.6 mm (ref 45–69)
Arachidonic Acid (MA): 64.3 mm (ref 51–71)
Kaolin with Heparinase: 64.7 mm (ref 53–68)

## 2021-01-31 LAB — SURGICAL PCR SCREEN
MRSA, PCR: NEGATIVE
Staphylococcus aureus: NEGATIVE

## 2021-01-31 LAB — PROTIME-INR
INR: 1.2 (ref 0.8–1.2)
Prothrombin Time: 15.1 seconds (ref 11.4–15.2)

## 2021-01-31 LAB — APTT: aPTT: 31 seconds (ref 24–36)

## 2021-01-31 NOTE — Progress Notes (Signed)
Per pt, he tested positive for COVID 01/18/21, via Home test. He stated he has completed his Molnupiravir treatment which was prescribed by his PCP, Dr. Shon Baton. Pt states his symptoms resolved 01/23/21. Patient denies any new or worsening flu-like/ COVID symptoms today. Because of his recent COVID result, pt will not need be retested (since it has been <90 days). However, patient made aware that he will need to bring a note from his provider indicating that he did indeed have COVID and was prescribed treatment; patient instructed to bring this doctor's note DOS so as to avoid being re-tested.

## 2021-01-31 NOTE — Progress Notes (Addendum)
PCP - Shon Baton MD Cardiologist - [name] GI -[name] MD -follow up for Barrett's.   PPM/ICD - Denies   Chest x-ray - N/A EKG - 07/25/2020 Stress Test - 08/28/2020 ECHO - 10/15/2020 Cardiac Cath - 10/21/2018  Sleep Study - Yes CPAP - Yes. Pt dos not setting.   Pt denies hx of Diabetes.  Blood Thinner Instructions: Stop Eliquis 3 day prior to surgery and start Plavix 5 days prior to surgery Aspirin Instructions: N/A  ERAS Protcol -No PRE-SURGERY Ensure or G2- N/A  COVID TEST- Per pt, positive home test on 01/18/2021. See Progress note.   Anesthesia review: Yes, extensive cardiac hx  Patient denies shortness of breath, fever, cough and chest pain at PAT appointment   All instructions explained to the patient, with a verbal understanding of the material. Patient agrees to go over the instructions while at home for a better understanding. Patient also instructed to self quarantine after being tested for COVID-19. The opportunity to ask questions was provided.

## 2021-01-31 NOTE — Progress Notes (Signed)
Emailed Allyne Gee, RN with fr Brabham's office who clarified that pt is to stop Eliquis 3 day prior to surgery and start Plavix 5 days prior to surgery. Per Cristie Hem, she has notified pt's wife.

## 2021-02-01 NOTE — Anesthesia Preprocedure Evaluation (Addendum)
Anesthesia Evaluation  Patient identified by MRN, date of birth, ID band Patient awake    Reviewed: Allergy & Precautions, H&P , NPO status , Patient's Chart, lab work & pertinent test results  Airway Mallampati: II   Neck ROM: full    Dental   Pulmonary shortness of breath, asthma , sleep apnea , former smoker,    breath sounds clear to auscultation       Cardiovascular hypertension, + CAD, + Past MI, + CABG and +CHF  + dysrhythmias Atrial Fibrillation  Rhythm:regular Rate:Normal     Neuro/Psych  Neuromuscular disease    GI/Hepatic GERD  ,  Endo/Other  Hypothyroidism   Renal/GU      Musculoskeletal  (+) Arthritis ,   Abdominal   Peds  Hematology   Anesthesia Other Findings   Reproductive/Obstetrics                            Anesthesia Physical Anesthesia Plan  ASA: III  Anesthesia Plan: General   Post-op Pain Management:    Induction: Intravenous  PONV Risk Score and Plan: 2 and Ondansetron, Dexamethasone and Treatment may vary due to age or medical condition  Airway Management Planned: Oral ETT  Additional Equipment: Arterial line  Intra-op Plan:   Post-operative Plan: Extubation in OR  Informed Consent: I have reviewed the patients History and Physical, chart, labs and discussed the procedure including the risks, benefits and alternatives for the proposed anesthesia with the patient or authorized representative who has indicated his/her understanding and acceptance.     Dental advisory given  Plan Discussed with: CRNA, Anesthesiologist and Surgeon  Anesthesia Plan Comments: (PAT note written 02/01/2021 by Myra Gianotti, PA-C. )       Anesthesia Quick Evaluation

## 2021-02-01 NOTE — Progress Notes (Addendum)
Anesthesia Chart Review:  Case: [id number] Date/Time: 02/08/21 0715   Procedure: LEFT TRANSCAROTID ARTERY REVASCULARIZATION (Left )   Anesthesia type: General   Pre-op diagnosis: BILATERAL CAROTID STENOSIS   Location: [facility] OR ROOM 16 / [place] OR   Surgeons: Serafina Mitchell, MD      DISCUSSION: Patient is an 87 year old male scheduled for the above procedure.  He was referred to vascular surgery by cardiologist Dr. Aundra Dubin after surveillance carotid ultrasound showed progression of bilateral carotid artery stenosis, asymptomatic.He is s/p right TCAR 09/13/20. Per VVS, he is to start Plavix 5 days prior to procedure (and continue 1 month post) and hold Eliquis 3 days prior to procedure.  History includes former smoker (quit 02/18/84), HTN, CAD (s/p CABG 12/11/10: LIMA-LAD, SVG-DIAG, SVG-OM-dCX, SVG-dRCA; NSTEMI 10/17/18 & 10/21/18 with occluded sequential limb SVG to [place], occluded SVG-DIAG & SVG-PDA, medical therapy), PE (12/23/10; post-CABG), chronic diastolic CHF, PAF, dyspnea, OSA (uses CPAP), hypothyroidism, hypercholesterolemia, AAA (4.5 cm 06/08/20), carotid artery disease (right TCAR 09/13/20), GERD, penile prothesis implant, spontaneous left pneumothorax/left pleural effusion (01/09/19 s/p CT, discontinued amiodarone due to concern for lung toxicity; thoracentesis 02/04/19, 02/24/19; s/p bronch/left VATS, talc pleurodesis, pleural biopsy, TEE 04/12/19), chronic back pain. Reported + COVID-19 01/18/21 via home test per patient, s/p molnupiravir, asymptomatic since 01/23/21.   Currently, last visit with HF cardiologist Dr. Aundra Dubin was on 08/14/20. 08/28/20 stress test was intermediate risk showing prior infarct without peri-infarct ischemia, EF 41% with akinesis in the basal and mid inferior and inferolateral walls. He recommended echo to confirm EF, but would not cath unless developed worsening chest pain or classic angina. Echo was done on 10/15/20 with finding consistent with Cardiolite (EF 40-45%, evidence of old  inferolateral MI). Currently, he has a routine visit scheduled for 02/04/21.  EGD 12/10/20 showed normal larynx, 1 cm hiatal hernia, multiple fundic gland polyps, normal examined duodenum--no endoscopic evidence of Barrett's esophagus, esophagitis, stricture or mass in the entire esophagus.  Patient instructed by PAT RN to bing in primary care records to support recent (< 90 days) + COVID-19 test. Anesthesia team to evaluate on the day of surgery. (ADDENDUM 02/06/21 10:01 AM: PCP records received indicating patient was prescribed molnupiravir on 01/21/21 after reporting a positive COVID-19 test. Patient had routine follow-up with HF cardiologist Dr. Aundra Dubin on 02/04/21. Stable exertional dyspnea after walking 100-200 yards. Rare atypical/non-exertional chest pain. In SR. Aware that Eliquis on hold transiently for upcoming TCAR. 3 month follow-up planned.)   VS: BP (!) 148/78   Pulse 67   Temp (!) 36.4 C (Oral)   Resp 20   Ht _0  (1.905 m)   Wt 93.6 kg   SpO2 98%   BMI 25.80 kg/m     PROVIDERS: Shon Baton, MD is PCP [facility]) - [name], MD is HF cardiologist - Christinia Gully, MD is pulmonologist. Last visit 03/08/19. Referred to CT surgery for surgical opinion regarding recurrent left pleural effusion.  - [name], MD is CT surgeon.  Last visit 07/21/19. No significant recurrence of left pleural effusion and no evidence of malignancy on 03/2019 pleural biopsies.  As needed CT surgery follow-up recommended. Wray Kearns, MD is GI   LABS: Labs reviewed: Acceptable for surgery. (all labs ordered are listed, but only abnormal results are displayed)  Labs Reviewed  COMPREHENSIVE METABOLIC PANEL - Abnormal; Notable for the following components:      Result Value   Glucose, Bld 103 (*)    GFR, Estimated 57 (*)  All other components within normal limits  URINALYSIS, ROUTINE W REFLEX MICROSCOPIC - Abnormal; Notable for the following components:   Hgb  urine dipstick SMALL (*)    All other components within normal limits  SURGICAL PCR SCREEN  PLATELET MAPPING ADP AND AA  CBC  PROTIME-INR  APTT  TYPE AND SCREEN     IMAGES: CTA Neck 08/20/20: IMPRESSION: 1. Prominent atherosclerotic changes of the right carotid bifurcation with soft and calcified plaques resulting approximately 90% stenosis. A small plaque ulceration is also noted. 2. Atherosclerotic changes in the left carotid bifurcation with soft and calcified plaque resulting in approximately 85% stenosis. - S/p right TCAR 09/13/20   EKG: 08/14/20: Sinus rhythm with Premature atrial complexes Left axis deviation Right bundle branch block Possible Inferior infarct , age undetermined , with posterior extension Abnormal ECG No significant change was found Confirmed by Virl Axe [phone]) on 08/14/2020 9:15:45 PM   CV: Carotid US 10/22/20: Summary:  Right Carotid: Patent stent with no evidence of stenosis in the right ICA. The ECA appears >50% stenosed.  Left Carotid: Velocities in the left ICA are consistent with a 60-79% stenosis. The ECA appears >50% stenosed.  Vertebrals: Bilateral vertebral arteries demonstrate antegrade flow.  Subclavians: Normal flow hemodynamics were seen in bilateral subclavian arteries.    Echo 10/15/20: IMPRESSIONS  1. Left ventricular ejection fraction, by estimation, is 40 to 45%. The  left ventricle has mildly decreased function. The left ventricle  demonstrates regional wall motion abnormalities with basal to mid  inferolateral akinesis and basal inferior  hypokinesis. There is mild left ventricular hypertrophy. Left ventricular  diastolic parameters are consistent with Grade I diastolic dysfunction  (impaired relaxation).  2. Right ventricular systolic function is normal. The right ventricular  size is mildly enlarged. Tricuspid regurgitation signal is inadequate for  assessing PA pressure.  3. The mitral valve is normal in  structure. Trivial mitral valve  regurgitation. No evidence of mitral stenosis. Moderate mitral annular  calcification.  4. The aortic valve is tricuspid. Aortic valve regurgitation is not  visualized. Mild aortic valve stenosis. Aortic valve mean gradient  measures 11.0 mmHg.  5. Aortic dilatation noted. There is mild dilatation of the aortic root,  measuring 38 mm.  6. The inferior vena cava is normal in size with greater than 50%  respiratory variability, suggesting right atrial pressure of 3 mmHg.  (Comparison: TEE 04/11/19: LVEF 50-55%, basal inferolateral aneurysm, mild AS, moderate MAC, trivial MR, no thrombus in LAA, no PFO or ASD by color Doppler, loculated pleural effusion)    Nuclear stress test 08/28/20:  The left ventricular ejection fraction is moderately decreased (30-44%).  Nuclear stress EF: 41%.  Blood pressure demonstrated a hypertensive response to exercise.  There was no ST segment deviation noted during stress.  Defect 1: There is a medium defect of severe severity present in the basal inferior, basal inferolateral, mid inferior and mid inferolateral location.  This is an intermediate risk study. There is medium size, severe severity irreversible defect in the basal and mid inferior and inferolateral walls consistent with prior infarct but no peri-infarct ischemia. LVEF 41% with akinesis in the basal and mid inferior and inferolateral walls. Reviewed by Dr. Aundra Dubin who wrote, "Inferolateral fixed perfusion defect on stress test consistent with prior MI, possibly due to occlusion of vein graft to OM branch seen on last cath (when he presented with NSTEMI). No evidence for ischemia. EF is lower at 41%, need to confirm with echo (order please). Would not cath  unless he is having worsening chest pain or classic angina (had been having atypical chest pain)."   Long term cardiac monitor 10/25/19-11/08/19: 1. Predominantly NSR.  2. 6 short runs NSVT.  3. 66 short  runs SVT, possible atrial tachycardia.  4. Rare PVCs. 5. 1.9% PACs.    AAA [place] 06/08/20: Summary:  Abdominal Aorta: There is evidence of abnormal dilatation of the mid and distal Abdominal aorta. The largest aortic diameter has increased compared to prior exam. Largest diameter of 4.5 cm.. Previous diameter measurement was 4.1 cm obtained on 05/2019.  Stenosis: Aorta-iliac atherosclerosis without focal stenosis.    Cardiac cath/FFR 10/22/18:  Dist RCA lesion is 70% stenosed.  Mid RCA lesion is 70% stenosed. - Coronary angiography of the native RCA demonstrated smooth 30% narrowing in the mid and distal segments on the LAO projection; however on the RAO projections the lesions are eccentric and on the RAO caudal projection the stenoses appeared 75%. - DFR was insignificant at 0.93. - FFR was not felt to be significant at 0.90 beyond the distal lesion and 0.89 beyond the mid lesion. RECOMMENDATION: Medical therapy in this patient who is status post recent non-ST segment elevation MI with continue dual antiplatelet therapy, and concomitant CAD with an occluded distal limb of a previously placed circumflex graft as noted on the initial studies. High potency statin therapy with target LDL less than 70.   Cardiac cath 10/21/18:  Ost 1st Diag lesion is 70% stenosed.  Ost 1st Mrg lesion is 80% stenosed.  Ost Cx to Prox Cx lesion is 50% stenosed.  Mid RCA lesion is 70% stenosed.  Dist RCA lesion is 70% stenosed.  Ost Ramus lesion is 100% stenosed.  Origin lesion is 100% stenosed.  Origin lesion is 100% stenosed.  Origin to Mid Graft lesion between Ramus and 3rd Mrg is 100% stenosed.  Prox LAD lesion is 25% stenosed. -Multivessel native CAD with proximal irregularity of the LAD, 70% proximal diagonal stenosis with competitive filling of the distal LAD via the LIMA graft; occluded intermediate vessel at its origin with 80% stenosis in a small bifurcating marginal branch of the  circumflex; and native RCA disease with 60 to 70% mid stenosis and focal 70% distal stenosis. - Patent LIMA to LAD. - Patent proximal graft to the marginal intermediate like vessel with occluded sequential limb to the distal circumflex. - Occluded vein graft which had supplied the diagonal vessel. - Occluded vein graft which had supplied the PDA. - LVEDP 20 mm. RECOMMENDATION: Increase medical therapy trial. We will continue with aspirin/Plavix. Will attempt to add low-dose carvedilol at 3.125 mg twice a day to the isosorbide. Since patient was still having residual chest discomfort, low-dose IV nitroglycerin was started in the Cath Lab. Depending upon blood pressure, consider possible low-dose amlodipine. High potency statin therapy with target LDL less than 70.   CPX 11/28/13: Conclusion: Exercise testing with gas exchange demonstrates a  normal functional capacity when compared to matched sedentary  norms. At peak exercise there is evidence of a restrictive  ventilatory response and chronotropic incompetence in the setting  of b-blockade.   Dent 11/14/13: Procedural Findings: Hemodynamics (mmHg) RA mean 7 RV 33/10 PA 32/12 PCWP mean 13  Oxygen saturations: PA 65% AO 85%  Cardiac Output (Fick) 7.53 Cardiac Index (Fick) 3.18  Final Conclusions: No significant elevation in right and left heart filling pressures at rest, no pulmonary hypertension. Oxygen saturation low but may be related to known sleep apnea + sedation. Patient had V/Q  scan in 3/14 with no evidence for chronic PE. Next step at this point is going to be a CPX.    Past Medical History:  Diagnosis Date  . Abdominal aortic aneurysm (AAA) ([place])   . Achilles tendinitis of right lower extremity   . Arthritis    "my whole body" (03/19/2018)  . Atherosclerosis of coronary artery bypass graft w/o angina pectoris   . Atrial fibrillation ([place])   . Barrett's esophagus   . CAD (coronary artery  disease)   . Carotid artery disease ([place])   . CHF (congestive heart failure) ([place])   . Chronic anticoagulation    PE  . Chronic lower back pain   . Dyspnea   . Essential tremor   . GERD (gastroesophageal reflux disease)   . High cholesterol   . Hyperlipidemia   . Hyperplasia, prostate   . Hypertension   . Hypothyroidism   . Myocardial infarction [facility])    2 in Jan. 2019  . Nail dystrophy   . OSA on CPAP    uses CPAP  . Osteoarthrosis   . Pneumonia    "now and once before" (03/19/2018)  . Pulmonary embolism [facility])    April 2012 after CABG  . S/P CABG (coronary artery bypass graft)   . Sleep apnea   . Spontaneous pneumothorax    left spontaneous pneumothorax/left pleural effusion, s/p chest tube 01/09/19; thoracentesis x2, s/p left VATS/tacl pleurodesis 04/12/19    Past Surgical History:  Procedure Laterality Date  . ACHILLES TENDON REPAIR Bilateral    2006 & 2004  . CARDIAC CATHETERIZATION  11/2010  . CORONARY ANGIOGRAPHY N/A 10/22/2018   Procedure: CORONARY ANGIOGRAPHY;  Surgeon: Troy Sine, MD;  Location: [place] CV LAB;  Service: Cardiovascular;  Laterality: N/A;  . CORONARY ARTERY BYPASS GRAFT  March 2012   CABG X 5  . ESOPHAGOGASTRODUODENOSCOPY (EGD) WITH PROPOFOL N/A 12/10/2020   Procedure: ESOPHAGOGASTRODUODENOSCOPY (EGD) WITH PROPOFOL;  Surgeon: Doran Stabler, MD;  Location: [facility] ENDOSCOPY;  Service: Gastroenterology;  Laterality: N/A;  . INGUINAL HERNIA REPAIR Left 1980  . INTRAVASCULAR PRESSURE WIRE/FFR STUDY N/A 10/22/2018   Procedure: INTRAVASCULAR PRESSURE WIRE/FFR STUDY;  Surgeon: Troy Sine, MD;  Location: [place] CV LAB;  Service: Cardiovascular;  Laterality: N/A;  . IR THORACENTESIS ASP PLEURAL SPACE W/IMG GUIDE  02/04/2019  . IR THORACENTESIS ASP PLEURAL SPACE W/IMG GUIDE  02/24/2019  . KNEE ARTHROSCOPY Left 2006  . LEFT HEART CATH AND CORS/GRAFTS ANGIOGRAPHY N/A 10/18/2018   Procedure: LEFT HEART CATH AND CORS/GRAFTS ANGIOGRAPHY;  Surgeon:  Lorretta Harp, MD;  Location: [place] CV LAB;  Service: Cardiovascular;  Laterality: N/A;  . LEFT HEART CATH AND CORS/GRAFTS ANGIOGRAPHY N/A 10/21/2018   Procedure: LEFT HEART CATH AND CORS/GRAFTS ANGIOGRAPHY;  Surgeon: Troy Sine, MD;  Location: [place] CV LAB;  Service: Cardiovascular;  Laterality: N/A;  . PENILE PROSTHESIS IMPLANT    . PLEURAL BIOPSY Left 04/12/2019   Procedure: Pleural Biopsy;  Surgeon: Grace Isaac, MD;  Location: [place];  Service: Thoracic;  Laterality: Left;  . PLEURAL EFFUSION DRAINAGE Left 04/12/2019   Procedure: Drainage Of Pleural Effusion;  Surgeon: Grace Isaac, MD;  Location: [place];  Service: Thoracic;  Laterality: Left;  . RIGHT HEART CATHETERIZATION N/A 11/14/2013   Procedure: RIGHT HEART CATH;  Surgeon: Larey Dresser, MD;  Location: [facility] CATH LAB;  Service: Cardiovascular;  Laterality: N/A;  . SHOULDER OPEN ROTATOR CUFF REPAIR Right 2003  . TALC PLEURODESIS Left  04/12/2019   Procedure: Pietro Cassis;  Surgeon: Grace Isaac, MD;  Location: [place];  Service: Thoracic;  Laterality: Left;  . TEE WITHOUT CARDIOVERSION N/A 03/24/2019   Procedure: TRANSESOPHAGEAL ECHOCARDIOGRAM (TEE);  Surgeon: Larey Dresser, MD;  Location: [facility] ENDOSCOPY;  Service: Cardiovascular;  Laterality: N/A;  . TEE WITHOUT CARDIOVERSION  04/12/2019   Procedure: Transesophageal Echocardiogram (Tee);  Surgeon: Grace Isaac, MD;  Location: [facility] OR;  Service: Thoracic;;  . THORACOSCOPY Left 04/12/2019   VIDEO BRONCHOSCOPY (N/A ) VIDEO ASSISTED THORACOSCOPY (Left Chest) TALC PLEURADESIS (Left   . TRANSCAROTID ARTERY REVASCULARIZATION Right 09/13/2020   Procedure: RIGHT TRANSCAROTID ARTERY REVASCULARIZATION USING 82m X 474mENROUTE TRANSCAROTID STEND SYSTEM;  Surgeon: BrSerafina MitchellMD;  [facility] Service: Vascular;  Laterality: Right;  . ULTRASOUND GUIDANCE FOR VASCULAR ACCESS  09/13/2020   Procedure: ULTRASOUND GUIDANCE FOR VASCULAR ACCESS;  Surgeon:  BrSerafina MitchellMD;  [place] [facility] Service: Vascular;;  . VIDEO ASSISTED THORACOSCOPY Left 04/12/2019   Procedure: VIDEO ASSISTED THORACOSCOPY;  Surgeon: GeGrace IsaacMD;  [facility] Service: Thoracic;  Laterality: Left;  . Marland KitchenIDEO BRONCHOSCOPY N/A 04/12/2019   Procedure: VIDEO BRONCHOSCOPY;  Surgeon: GeGrace IsaacMD;  [facility];  Service: Thoracic;  Laterality: N/A;    MEDICATIONS: . acetaminophen (TYLENOL) 500 MG tablet  . apixaban (ELIQUIS) 5 MG TABS tablet  . Ascorbic Acid 500 MG CAPS  . atorvastatin (LIPITOR) 80 MG tablet  . carvedilol (COREG) 3.125 MG tablet  . cetirizine (ZYRTEC) 10 MG tablet  . cholecalciferol (VITAMIN D) 1000 UNITS tablet  . clopidogrel (PLAVIX) 75 MG tablet  . furosemide (LASIX) 40 MG tablet  . isosorbide mononitrate (IMDUR) 30 MG 24 hr tablet  . levothyroxine (SYNTHROID, LEVOTHROID) 50 MCG tablet  . Molnupiravir 200 MG CAPS  . Multiple Vitamins-Minerals (CENTRUM SILVER PO)  . pantoprazole (PROTONIX) 40 MG tablet  . tamsulosin (FLOMAX) 0.4 MG CAPS capsule   No current facility-administered medications for this encounter.    AlMyra GianottiPA-C Surgical Short Stay/Anesthesiology [facility]hone ([phone] Park Medical Centerhone ([phone]/13/2022 4:01 PM

## 2021-02-04 ENCOUNTER — Encounter (HOSPITAL_COMMUNITY): Payer: Self-pay | Admitting: Cardiology

## 2021-02-04 ENCOUNTER — Other Ambulatory Visit: Payer: Self-pay

## 2021-02-04 ENCOUNTER — Ambulatory Visit (HOSPITAL_COMMUNITY)
Admission: RE | Admit: 2021-02-04 | Discharge: 2021-02-04 | Disposition: A | Discharge status: Home / Self Care | Payer: Medicare Other | Source: Ambulatory Visit | Attending: Cardiology | Admitting: Cardiology

## 2021-02-04 ENCOUNTER — Other Ambulatory Visit (HOSPITAL_COMMUNITY): Payer: Self-pay

## 2021-02-04 VITALS — BP 130/74 | HR 70 | Wt 210.0 lb

## 2021-02-04 DIAGNOSIS — I452 Bifascicular block: Secondary | ICD-10-CM | POA: Diagnosis not present

## 2021-02-04 DIAGNOSIS — Z79899 Other long term (current) drug therapy: Secondary | ICD-10-CM | POA: Diagnosis not present

## 2021-02-04 DIAGNOSIS — Z7902 Long term (current) use of antithrombotics/antiplatelets: Secondary | ICD-10-CM | POA: Diagnosis not present

## 2021-02-04 DIAGNOSIS — I491 Atrial premature depolarization: Secondary | ICD-10-CM | POA: Insufficient documentation

## 2021-02-04 DIAGNOSIS — E785 Hyperlipidemia, unspecified: Secondary | ICD-10-CM | POA: Diagnosis not present

## 2021-02-04 DIAGNOSIS — Z8249 Family history of ischemic heart disease and other diseases of the circulatory system: Secondary | ICD-10-CM | POA: Diagnosis not present

## 2021-02-04 DIAGNOSIS — Z951 Presence of aortocoronary bypass graft: Secondary | ICD-10-CM | POA: Insufficient documentation

## 2021-02-04 DIAGNOSIS — I480 Paroxysmal atrial fibrillation: Secondary | ICD-10-CM | POA: Diagnosis not present

## 2021-02-04 DIAGNOSIS — N183 Chronic kidney disease, stage 3 unspecified: Secondary | ICD-10-CM | POA: Diagnosis not present

## 2021-02-04 DIAGNOSIS — I350 Nonrheumatic aortic (valve) stenosis: Secondary | ICD-10-CM | POA: Insufficient documentation

## 2021-02-04 DIAGNOSIS — I252 Old myocardial infarction: Secondary | ICD-10-CM | POA: Diagnosis not present

## 2021-02-04 DIAGNOSIS — I714 Abdominal aortic aneurysm, without rupture: Secondary | ICD-10-CM | POA: Insufficient documentation

## 2021-02-04 DIAGNOSIS — I2510 Atherosclerotic heart disease of native coronary artery without angina pectoris: Secondary | ICD-10-CM | POA: Diagnosis not present

## 2021-02-04 DIAGNOSIS — Z8616 Personal history of COVID-19: Secondary | ICD-10-CM | POA: Diagnosis not present

## 2021-02-04 DIAGNOSIS — J90 Pleural effusion, not elsewhere classified: Secondary | ICD-10-CM | POA: Diagnosis not present

## 2021-02-04 DIAGNOSIS — I6523 Occlusion and stenosis of bilateral carotid arteries: Secondary | ICD-10-CM | POA: Insufficient documentation

## 2021-02-04 DIAGNOSIS — I5020 Unspecified systolic (congestive) heart failure: Secondary | ICD-10-CM

## 2021-02-04 DIAGNOSIS — Z7901 Long term (current) use of anticoagulants: Secondary | ICD-10-CM | POA: Diagnosis not present

## 2021-02-04 DIAGNOSIS — I509 Heart failure, unspecified: Secondary | ICD-10-CM | POA: Diagnosis not present

## 2021-02-04 DIAGNOSIS — I129 Hypertensive chronic kidney disease with stage 1 through stage 4 chronic kidney disease, or unspecified chronic kidney disease: Secondary | ICD-10-CM | POA: Diagnosis not present

## 2021-02-04 DIAGNOSIS — Z87891 Personal history of nicotine dependence: Secondary | ICD-10-CM | POA: Insufficient documentation

## 2021-02-04 MED ORDER — DAPAGLIFLOZIN PROPANEDIOL 10 MG PO TABS: 10.0000 mg | ORAL_TABLET | Freq: Every day | ORAL | 6 refills | Status: DC

## 2021-02-04 MED ORDER — FUROSEMIDE 40 MG PO TABS: 20.0000 mg | ORAL_TABLET | Freq: Every day | ORAL | Status: DC

## 2021-02-04 NOTE — Progress Notes (Signed)
HDate:  02/04/2021   ID:  Eric Gordon, DOB 1930-11-07, MRN [id number]   Provider location: [facility] Type of Visit: Established patient  PCP:  Shon Baton, MD  Cardiologist:  Dr. Aundra Dubin  Chief Complaint: Shortness of breath   History of Present Illness: Eric Gordon is a 87 y.o. male with history of CAD s/p CABG.  He has had trouble long-term with exertional dyspnea.  Dyspnea triggered evaluation in 2012 leading to CABG but was not resolved by CABG.  PFTs in 3/14 showed only mild obstructive airways disease and V/Q scan showed no PE.  Echo in 9/14 showed normal LV systolic function with moderately dilated RV. [place] in 2/15 showed normal right and left heart filling pressures and normal PA pressure.  Finally, he had a CPX in 3/15 that showed normal capacity compared to age-matched sedentary norms.  He was noted to have chronotropic incompetence.  At a prior appointment, I took him off metoprolol given chronotropic incompetence noted on CPX.  Dyspnea improved significantly with weight loss.  He had Cardiolite in 8/18 with EF 52%, no ischemia or infarction.   He was admitted in 1/20 with NSTEMI.  LHC showed new occlusion of the branch of SVG-ramus and OM that touched down on OM.  There were also serial 70% stenoses in the mid/distal RCA.  There was not thought to be an interventional option and patient was treated medically. He was discharged home but presented a couple days later with recurrent chest pain and dyspnea.  Cath was repeated showing no change from prior.  This admission, he had FFR of the RCA which did not suggest hemodynamic significance.  Echo showed EF 55-60%, normal RV.  CTA did not show a PE. He was noted to be volume overloaded and was diuresed.  He was also noted to be in atrial fibrillation with RVR transiently.  ASA/Plavix was stopped and Eliquis was started. He was discharged to SNF.  Atrial fibrillation recurred and he was started on  amiodarone with conversion back to NSR.   In 4/20, he was admitted with left spontaneous PTX requiring chest tube.  After this, he developed recurrent left pleural effusions requiring thoracentesis x 3 so far.  Pleural fluid was exudative with eosinophil predominance.  With elevated ESR, there was concern that amiodarone could be involved, so this medication was stopped.  CT chest in 6/20 showed LLL consolidation/collapse with small left effusion, there was a rim-enhancing lesion posterior to the heart between esophagus and left atrium, ?loculated fluid.  TEE was done in 7/20 and confirmed loculated pleural effusion behind the left atrium.  In 7/20, patient had VATS on the left.   Carotid dopplers in 9/21 showed 80-99% RICA stenosis, 22-29% LICA.  AAA [place] in 9/21 showed 4.5 cm AAA. Patient was referred to Dr. Trula Slade for evaluation => he has had TCAR on right in 12/21.  Plan for TCAR on left later in May.   Cardiolite in 12/21 with EF 41%, no ischemia, fixed inferolateral defect. Echo in 1/22 showed EF 40-45%, basal to mid inferolateral AK, basal inferior HK, normal RV, mild aortic stenosis mean gradient 11 mmHg.  Patient returns for followup of dyspnea and CAD.  Eliquis currently on hold for left TCAR later this month.  Stable dyspnea after walking 100-200 yards.  Rare atypical chest pain (not exertional).  No orthopnea/PND.  No stroke-like symptoms.  No BRBPR/melena.   ECG (personally reviewed): NSR, LAFB, RBBB  Labs (2/15): K  4.2, creatinine 1.2, LDL 87, HDL 39, BNP 71 Labs (4/15): K 3.9, creatinine 1.1 Labs (12/15): LDL 73, HDL 37, K 3.9, creatinine 1.0 Labs (12/16): LDL 71, HDL 49 Labs (3/17): K 4.2, creatinine 1.28, HCT 45 Labs (8/18): LDL 82, HDL 41, TSH normal, hgb 16.3, K 4.1, creatinine 1.05 Labs (2/20): LFTs normal, pro BNP 842 Labs (3/20): K 3.8, creatinine 1.48, TSH mildly elevated at 6.97, free T3 and free T4 normal Labs (4/20): K 3.9, creatinine 1.36 Labs (2/20): LDL 52 Labs  (8/20): BNP 241, creatinine 1.21  Labs (9/20): BNP 244, K 4, creatinine 1.12 Labs (2/21): K 4.3, creatinine 1.26, BNP 271 Labs (6/21): K 4.2, creatinine 1.11, LDL 57  PMH: 1. Essential tremor 2. CAD: s/p CABG in 3/12.   - Cardiolite (8/18): EF 52%, no ischemia/infarction.  - NSTEMI (1/20):  LHC showed new occlusion of the branch of SVG-ramus and OM that touched down on OM.  There were also serial 70% stenoses in the mid/distal RCA.  FFR of RCA was negative.  - Cardiolite (12/21): EF 41%, no ischemia, fixed inferolateral defect 3. Atrial fibrillation: Only noted post-op CABG.  4. PE: Post-op CABG in 2012.  5. AAA: 3.6 cm on [place] in 3/14.  3.6 cm on [place] in 3/15. 3.6 cm on [place] 8/16. 4.1 cm on [place] 4/17.  - AAA [place] (5/18): 4.1 cm AAA.  - AAA [place] (5/19): 4.1 cm AAA.  - AAA [place] (4/20): 4.1 cm AAA.  - AAA [place] (9/21): 4.5 cm AAA 6. OSA: On CPAP.  7. Carotid stenosis: Carotid dopplers (2/68) with 34-19% LICA stenosis.  Carotid dopplers (6/22) with 29-79% LICA stenosis. Carotid dopplers (3/16) with 60-79% RCIA stenosis, 89-21% LICA stenosis.  - Carotid dopplers (8/16) with 40-59% RICA stenosis, 19-41% LICA stenosis.  - Carotid dopplers (8/17) with 74-08% RICA, 14-48% LICA. - Carotid dopplers (9/18) with 18-56% RICA, 31-49% LICA.   - Carotid dopplers (9/19) with 70-26% RICA, 37-85% LICA.  - Carotid dopplers (9/20) with 60-79% BICA - Carotid dopplers (9/21): 88-50% RICA, 27-74% LICA - Right TCAR 12/87 8. Asthma 9. PNA x 2 10. Chronic diastolic CHF/dyspnea: Echo (9/14) with EF 60-65%, mild LVH, very mild AS with mean gradient 10 mmHg, RV moderately dilated.  PFTs (3/14) showed mild obstructive airways disease.  V/Q scan (3/14) with no PE.  ENT workup for upper respiratory causes was negative.  RHC (2/15) with mean RA 7, PA 32/12, mean PCWP 13, CI 3.18.  CPX (3/15) with peak VO2 16.4, VE/VCO2 33; normal when compared to age-matched sedentary normals; chronotropic incompetence was noted.  Dyspnea was improved  with weight loss.  - Echo (8/17): EF 60-65%, mild AS.  - Echo (1/20): EF 55-60%, normal RV size and systolic function.  - Echo (6/20): EF 55%, mild LVH, mild AS, normal RV size and systolic function, ?LA mass or mass impinging on posterior LA.  - TEE (7/20): EF 50-55%, mild LVH, basal inferolateral aneurysm, normal RV size/systolic function, loculated pleural fluid behind LA.  - Echo (1/22): EF 40-45%, basal to mid inferolateral AK, basal inferior HK, normal RV, mild aortic stenosis mean gradient 11 mmHg. 11. HTN 12. Aortic stenosis: Mild.  13. Ascending aortic aneurysm: CTA chest with 4.4 cm ascending aorta in 1/20.  - 4.2 cm ascending aorta on CT chest 6/20.  14. Atrial fibrillation: Paroxysmal.  15. CKD: Stage 3.  16. Left lung spontaneous PTX then recurrent left pleural effusion.  - Left VATS in 7/20.  17. Palpitations:  - Zio patch (  6/20): NSR with 1 short NSVT run and few short SVT runs, no atrial fibrillation.  - Zio patch 2 wks (2/21): 6 short NSVT runs, 66 short SVT runs, rare PACs/PVCs.  45. COVID-19 infection 4/22.    Current Outpatient Medications  Medication Sig Dispense Refill  . acetaminophen (TYLENOL) 500 MG tablet Take 1,000 mg by mouth every 6 (six) hours as needed for moderate pain or headache.    . Ascorbic Acid 500 MG CAPS Take 500 mg by mouth daily.    Marland Kitchen atorvastatin (LIPITOR) 80 MG tablet Take 1 tablet (80 mg total) by mouth daily. 30 tablet 6  . carvedilol (COREG) 3.125 MG tablet Take 1 tablet (3.125 mg total) by mouth 2 (two) times daily. 60 tablet 11  . cetirizine (ZYRTEC) 10 MG tablet Take 10 mg by mouth daily.    . cholecalciferol (VITAMIN D) 1000 UNITS tablet Take 1,000 Units by mouth every evening.    . clopidogrel (PLAVIX) 75 MG tablet Take 1 tablet (75 mg total) by mouth daily. 30 tablet 11  . dapagliflozin propanediol (FARXIGA) 10 MG TABS tablet Take 1 tablet (10 mg total) by mouth daily before breakfast. 30 tablet 6  . isosorbide mononitrate (IMDUR) 30  MG 24 hr tablet Take 2 tablets (60 mg total) by mouth daily.    Marland Kitchen levothyroxine (SYNTHROID, LEVOTHROID) 50 MCG tablet Take 50 mcg by mouth daily before breakfast.    . Multiple Vitamins-Minerals (CENTRUM SILVER PO) Take 1 tablet by mouth daily.     . pantoprazole (PROTONIX) 40 MG tablet Take 40 mg by mouth daily.     . tamsulosin (FLOMAX) 0.4 MG CAPS capsule Take 0.4 mg by mouth daily after supper.     . furosemide (LASIX) 40 MG tablet Take 0.5 tablets (20 mg total) by mouth daily. 30 tablet    No current facility-administered medications for this encounter.    Allergies:   Ancef [cefazolin sodium] and Vancomycin   Social History:  The patient  reports that he quit smoking about 36 years ago. His smoking use included cigarettes. He has a 35.00 pack-year smoking history. He has never used smokeless tobacco. He reports current alcohol use of about 2.0 standard drinks of alcohol per week. He reports that he does not use drugs.   Family History:  The patient's family history includes Coronary artery disease in his mother; Heart disease in his mother; Hypertension in his mother.   ROS:  Please see the history of present illness.   All other systems are personally reviewed and negative.   Exam:   BP 130/74   Pulse 70   Wt 95.3 kg (210 lb)   SpO2 95%   BMI 26.25 kg/m  General: NAD Neck: No JVD, no thyromegaly or thyroid nodule.  Lungs: Clear to auscultation bilaterally with normal respiratory effort. CV: Nondisplaced PMI.  Heart regular S1/S2, no S3/S4, 2/6 SEM RUSB.  Trace ankle edema.  No carotid bruit.  Normal pedal pulses.  Abdomen: Soft, nontender, no hepatosplenomegaly, no distention.  Skin: Intact without lesions or rashes.  Neurologic: Alert and oriented x 3.  Psych: Normal affect. Extremities: No clubbing or cyanosis.  HEENT: Normal.   Recent Labs: 01/31/2021: ALT 14; BUN 19; Creatinine, Ser 1.22; Hemoglobin 13.6; Platelets 185; Potassium 4.3; Sodium 142  Personally reviewed    Wt Readings from Last 3 Encounters:  02/04/21 95.3 kg (210 lb)  01/31/21 93.6 kg (206 lb 7 oz)  01/14/21 93.4 kg (206 lb)      ASSESSMENT  AND PLAN:  1. CAD: s/p CABG 3/12. NSTEMI 1/20, LHC showed new occlusion of the branch of SVG-ramus and OM that touched down on OM.  There were also serial 70% stenoses in the mid/distal RCA.  FFR of RCA was negative.  No interventional target.  Cardiolite in 12/21 showed inferolateral infarction with no ischemia, consistent with findings on NSTEMI in 1/20.  Rare atypical chest pain.    - He is on combination of Plavix and Eliquis post-TCAR, Eliquis on hold transiently for his 2nd TCAR.  - Continue Imdur 60 mg daily.  - Continue atorvastatin 80 daily.   - Off ranolazine with prolonged QTc.  2. Hyperlipidemia: Goal LDL < 70.   - On atorvastatin 80 daily 3. Chronic HF with mid range EF: TEE in 7/20 showed EF 50-55%, basal inferolateral aneurysm, and normal RV.  Echo in 1/22 with EF 40-45%, basal-mid inferolateral akinesis and inferior hypokinesis, normal RV.  He is not volume overloaded on exam.  NYHA class II-III  I think that his dyspnea is primarily related to his left lung disease (mild and stable). - Decrease Lasix to 20 mg daily.   - Start dapagliflozin 10 mg daily. BMET 2 wks.  - Continue Coreg 3.125 mg bid.  4. Spontaneous left PTX with recurrent left-sided pleural effusion:  He is s/p left VATS/pleurodesis in 7/20.    5. Carotid stenosis: Severe, asymptomatic RICA stenosis s/p TCAR on right, planning TCAR on left later this month.  - On Plavix, Eliquis on hold for TCAR and will be restarted afterwards.    6. AAA: 4.5 cm AAA in 9/21, repeat in 9/22.      7. Aortic stenosis: Mild.   8. Atrial fibrillation: Paroxysmal. He is significantly symptomatic while in atrial fibrillation.  He is in NSR today.  He is off amiodarone due to concern for pulmonary toxicity.   - Continue Eliquis 5 mg bid.   Recommended follow-up:  3 months.   Signed, [name], MD  02/04/2021  [facility] [address] [facility] [place] [address] [phone] (office) [phone] (fax)

## 2021-02-04 NOTE — Patient Instructions (Signed)
Decrease Furosemide to 20 mg (1/2 tab) Daily  Start Farxiga 10 mg Daily  Labs done today, your results will be available in MyChart, we will contact you for abnormal readings.  Your physician recommends that you schedule a follow-up appointment in: 3 months  If you have any questions or concerns before your next appointment please send [place] a message through [place] or call our office at [phone].    TO LEAVE A MESSAGE FOR THE NURSE SELECT OPTION 2, PLEASE LEAVE A MESSAGE INCLUDING: . YOUR NAME . DATE OF BIRTH . CALL BACK NUMBER . REASON FOR CALL**this is important as we prioritize the call backs  [place] AS LONG AS YOU CALL BEFORE 4:00 PM  At the [facility], you and your health needs are our priority. As part of our continuing mission to provide you with exceptional heart care, we have created designated Provider Care Teams. These Care Teams include your primary Cardiologist (physician) and Advanced Practice Providers (APPs- Physician Assistants and Nurse Practitioners) who all work together to provide you with the care you need, when you need it.   You may see any of the following providers on your designated Care Team at your next follow up: Marland Kitchen Dr Glori Bickers . Dr [name] . Dr Vickki Muff . Darrick Grinder, NP . Lyda Jester, [place] . Audry Riles, PharmD   Please be sure to bring in all your medications bottles to every appointment.

## 2021-02-05 DIAGNOSIS — I2510 Atherosclerotic heart disease of native coronary artery without angina pectoris: Secondary | ICD-10-CM | POA: Diagnosis not present

## 2021-02-05 DIAGNOSIS — I130 Hypertensive heart and chronic kidney disease with heart failure and stage 1 through stage 4 chronic kidney disease, or unspecified chronic kidney disease: Secondary | ICD-10-CM | POA: Diagnosis not present

## 2021-02-05 DIAGNOSIS — N1831 Chronic kidney disease, stage 3a: Secondary | ICD-10-CM | POA: Diagnosis not present

## 2021-02-05 DIAGNOSIS — Z0000 Encounter for general adult medical examination without abnormal findings: Secondary | ICD-10-CM | POA: Diagnosis not present

## 2021-02-05 DIAGNOSIS — G252 Other specified forms of tremor: Secondary | ICD-10-CM | POA: Diagnosis not present

## 2021-02-05 DIAGNOSIS — E785 Hyperlipidemia, unspecified: Secondary | ICD-10-CM | POA: Diagnosis not present

## 2021-02-05 DIAGNOSIS — I480 Paroxysmal atrial fibrillation: Secondary | ICD-10-CM | POA: Diagnosis not present

## 2021-02-05 DIAGNOSIS — I714 Abdominal aortic aneurysm, without rupture: Secondary | ICD-10-CM | POA: Diagnosis not present

## 2021-02-05 DIAGNOSIS — I5022 Chronic systolic (congestive) heart failure: Secondary | ICD-10-CM | POA: Diagnosis not present

## 2021-02-05 DIAGNOSIS — E1122 Type 2 diabetes mellitus with diabetic chronic kidney disease: Secondary | ICD-10-CM | POA: Diagnosis not present

## 2021-02-05 DIAGNOSIS — N401 Enlarged prostate with lower urinary tract symptoms: Secondary | ICD-10-CM | POA: Diagnosis not present

## 2021-02-05 DIAGNOSIS — R82998 Other abnormal findings in urine: Secondary | ICD-10-CM | POA: Diagnosis not present

## 2021-02-05 DIAGNOSIS — I700 Atherosclerosis of aorta: Secondary | ICD-10-CM | POA: Diagnosis not present

## 2021-02-06 ENCOUNTER — Inpatient Hospital Stay (HOSPITAL_COMMUNITY): Admission: RE | Admit: 2021-02-06 | Payer: Non-veteran care | Source: Ambulatory Visit

## 2021-02-08 ENCOUNTER — Inpatient Hospital Stay (HOSPITAL_COMMUNITY)
Admission: RE | Admit: 2021-02-08 | Discharge: 2021-02-09 | DRG: 035 | Disposition: A | Discharge status: Home / Self Care | Payer: Medicare Other | Attending: Surgery | Admitting: Surgery

## 2021-02-08 ENCOUNTER — Inpatient Hospital Stay (HOSPITAL_COMMUNITY): Payer: Medicare Other

## 2021-02-08 ENCOUNTER — Encounter (HOSPITAL_COMMUNITY)
Admission: RE | Disposition: A | Discharge status: Home / Self Care | Payer: Self-pay | Source: Home / Self Care | Attending: Surgery

## 2021-02-08 ENCOUNTER — Inpatient Hospital Stay (HOSPITAL_COMMUNITY): Payer: Medicare Other | Admitting: Physician Assistant

## 2021-02-08 ENCOUNTER — Other Ambulatory Visit: Payer: Self-pay

## 2021-02-08 ENCOUNTER — Encounter (HOSPITAL_COMMUNITY): Payer: Self-pay | Admitting: Surgery

## 2021-02-08 DIAGNOSIS — K219 Gastro-esophageal reflux disease without esophagitis: Secondary | ICD-10-CM | POA: Diagnosis present

## 2021-02-08 DIAGNOSIS — I252 Old myocardial infarction: Secondary | ICD-10-CM

## 2021-02-08 DIAGNOSIS — N183 Chronic kidney disease, stage 3 unspecified: Secondary | ICD-10-CM | POA: Diagnosis not present

## 2021-02-08 DIAGNOSIS — I25810 Atherosclerosis of coronary artery bypass graft(s) without angina pectoris: Secondary | ICD-10-CM | POA: Diagnosis present

## 2021-02-08 DIAGNOSIS — I4891 Unspecified atrial fibrillation: Secondary | ICD-10-CM | POA: Diagnosis present

## 2021-02-08 DIAGNOSIS — E785 Hyperlipidemia, unspecified: Secondary | ICD-10-CM | POA: Diagnosis present

## 2021-02-08 DIAGNOSIS — I6522 Occlusion and stenosis of left carotid artery: Secondary | ICD-10-CM | POA: Diagnosis not present

## 2021-02-08 DIAGNOSIS — Z79899 Other long term (current) drug therapy: Secondary | ICD-10-CM | POA: Diagnosis not present

## 2021-02-08 DIAGNOSIS — E039 Hypothyroidism, unspecified: Secondary | ICD-10-CM | POA: Diagnosis present

## 2021-02-08 DIAGNOSIS — M1990 Unspecified osteoarthritis, unspecified site: Secondary | ICD-10-CM | POA: Diagnosis not present

## 2021-02-08 DIAGNOSIS — E7800 Pure hypercholesterolemia, unspecified: Secondary | ICD-10-CM | POA: Diagnosis not present

## 2021-02-08 DIAGNOSIS — Z951 Presence of aortocoronary bypass graft: Secondary | ICD-10-CM | POA: Diagnosis not present

## 2021-02-08 DIAGNOSIS — Z86711 Personal history of pulmonary embolism: Secondary | ICD-10-CM

## 2021-02-08 DIAGNOSIS — Z881 Allergy status to other antibiotic agents status: Secondary | ICD-10-CM

## 2021-02-08 DIAGNOSIS — I5032 Chronic diastolic (congestive) heart failure: Secondary | ICD-10-CM | POA: Diagnosis not present

## 2021-02-08 DIAGNOSIS — Z8249 Family history of ischemic heart disease and other diseases of the circulatory system: Secondary | ICD-10-CM | POA: Diagnosis not present

## 2021-02-08 DIAGNOSIS — Z79890 Hormone replacement therapy (postmenopausal): Secondary | ICD-10-CM

## 2021-02-08 DIAGNOSIS — G8929 Other chronic pain: Secondary | ICD-10-CM | POA: Diagnosis not present

## 2021-02-08 DIAGNOSIS — Z87891 Personal history of nicotine dependence: Secondary | ICD-10-CM

## 2021-02-08 DIAGNOSIS — G4733 Obstructive sleep apnea (adult) (pediatric): Secondary | ICD-10-CM | POA: Diagnosis present

## 2021-02-08 DIAGNOSIS — I714 Abdominal aortic aneurysm, without rupture: Secondary | ICD-10-CM | POA: Diagnosis present

## 2021-02-08 DIAGNOSIS — I214 Non-ST elevation (NSTEMI) myocardial infarction: Secondary | ICD-10-CM | POA: Diagnosis not present

## 2021-02-08 DIAGNOSIS — I6529 Occlusion and stenosis of unspecified carotid artery: Secondary | ICD-10-CM | POA: Diagnosis present

## 2021-02-08 DIAGNOSIS — I2510 Atherosclerotic heart disease of native coronary artery without angina pectoris: Secondary | ICD-10-CM | POA: Diagnosis not present

## 2021-02-08 DIAGNOSIS — I130 Hypertensive heart and chronic kidney disease with heart failure and stage 1 through stage 4 chronic kidney disease, or unspecified chronic kidney disease: Secondary | ICD-10-CM | POA: Diagnosis not present

## 2021-02-08 DIAGNOSIS — Z7901 Long term (current) use of anticoagulants: Secondary | ICD-10-CM | POA: Diagnosis not present

## 2021-02-08 DIAGNOSIS — I6521 Occlusion and stenosis of right carotid artery: Secondary | ICD-10-CM | POA: Diagnosis not present

## 2021-02-08 DIAGNOSIS — J90 Pleural effusion, not elsewhere classified: Secondary | ICD-10-CM | POA: Diagnosis not present

## 2021-02-08 HISTORY — PX: TRANSCAROTID ARTERY REVASCULARIZATIONÂ: SHX6778

## 2021-02-08 LAB — POCT ACTIVATED CLOTTING TIME
Activated Clotting Time: 231 seconds
Activated Clotting Time: 225 seconds

## 2021-02-08 SURGERY — TRANSCAROTID ARTERY REVASCULARIZATION (TCAR)
Anesthesia: General | Site: Neck | Laterality: Left

## 2021-02-08 MED ORDER — DAPAGLIFLOZIN PROPANEDIOL 10 MG PO TABS
10.0000 mg | ORAL_TABLET | Freq: Every day | ORAL | Status: DC
  Administered 2021-02-09: 10 mg via ORAL
  Filled 2021-02-08: qty 1

## 2021-02-08 MED ORDER — GLYCOPYRROLATE PF 0.2 MG/ML IJ SOSY
PREFILLED_SYRINGE | INTRAMUSCULAR | Status: DC | PRN
  Administered 2021-02-08 (×2): .1 mg via INTRAVENOUS

## 2021-02-08 MED ORDER — MAGNESIUM SULFATE 2 GM/50ML IV SOLN: 2.0000 g | Freq: Every day | INTRAVENOUS | Status: DC | PRN

## 2021-02-08 MED ORDER — CIPROFLOXACIN IN D5W 400 MG/200ML IV SOLN
400.0000 mg | INTRAVENOUS | Status: AC
  Administered 2021-02-08: 400 mg via INTRAVENOUS
  Filled 2021-02-08: qty 200

## 2021-02-08 MED ORDER — DIPHENHYDRAMINE HCL 12.5 MG/5ML PO ELIX: 50.0000 mg | ORAL_SOLUTION | Freq: Once | ORAL | Status: DC

## 2021-02-08 MED ORDER — ALUM & MAG HYDROXIDE-SIMETH 200-200-20 MG/5ML PO SUSP: 15.0000 mL | ORAL | Status: DC | PRN

## 2021-02-08 MED ORDER — METOPROLOL TARTRATE 5 MG/5ML IV SOLN: 2.0000 mg | INTRAVENOUS | Status: DC | PRN

## 2021-02-08 MED ORDER — ACETAMINOPHEN 500 MG PO TABS: 1000.0000 mg | ORAL_TABLET | Freq: Four times a day (QID) | ORAL | Status: DC | PRN

## 2021-02-08 MED ORDER — POTASSIUM CHLORIDE CRYS ER 20 MEQ PO TBCR: 20.0000 meq | EXTENDED_RELEASE_TABLET | Freq: Every day | ORAL | Status: DC | PRN

## 2021-02-08 MED ORDER — PROTAMINE SULFATE 10 MG/ML IV SOLN
INTRAVENOUS | Status: AC
  Filled 2021-02-08: qty 5

## 2021-02-08 MED ORDER — FENTANYL CITRATE (PF) 100 MCG/2ML IJ SOLN
INTRAMUSCULAR | Status: AC
  Filled 2021-02-08: qty 2

## 2021-02-08 MED ORDER — CLOPIDOGREL BISULFATE 75 MG PO TABS: 75.0000 mg | ORAL_TABLET | Freq: Every day | ORAL | Status: DC

## 2021-02-08 MED ORDER — HEPARIN SODIUM (PORCINE) 1000 UNIT/ML IJ SOLN
INTRAMUSCULAR | Status: DC | PRN
  Administered 2021-02-08 (×2): 1000 [IU] via INTRAVENOUS
  Administered 2021-02-08: 9500 [IU] via INTRAVENOUS

## 2021-02-08 MED ORDER — DEXAMETHASONE SODIUM PHOSPHATE 10 MG/ML IJ SOLN
INTRAMUSCULAR | Status: AC
  Filled 2021-02-08: qty 1

## 2021-02-08 MED ORDER — FENTANYL CITRATE (PF) 100 MCG/2ML IJ SOLN
25.0000 ug | INTRAMUSCULAR | Status: DC | PRN
  Administered 2021-02-08 (×3): 50 ug via INTRAVENOUS

## 2021-02-08 MED ORDER — HYDRALAZINE HCL 20 MG/ML IJ SOLN: 5.0000 mg | INTRAMUSCULAR | Status: DC | PRN

## 2021-02-08 MED ORDER — VITAMIN D 25 MCG (1000 UNIT) PO TABS
1000.0000 [IU] | ORAL_TABLET | Freq: Every evening | ORAL | Status: DC
  Administered 2021-02-08: 1000 [IU] via ORAL
  Filled 2021-02-08: qty 1

## 2021-02-08 MED ORDER — IODIXANOL 320 MG/ML IV SOLN
INTRAVENOUS | Status: DC | PRN
  Administered 2021-02-08: 24 mL

## 2021-02-08 MED ORDER — SODIUM CHLORIDE 0.9 % IV SOLN
INTRAVENOUS | Status: DC | PRN
  Administered 2021-02-08: 500 mL

## 2021-02-08 MED ORDER — HEMOSTATIC AGENTS (NO CHARGE) OPTIME
TOPICAL | Status: DC | PRN
  Administered 2021-02-08: 1 via TOPICAL

## 2021-02-08 MED ORDER — CARVEDILOL 3.125 MG PO TABS
3.1250 mg | ORAL_TABLET | Freq: Two times a day (BID) | ORAL | Status: DC
  Administered 2021-02-08 – 2021-02-09 (×2): 3.125 mg via ORAL
  Filled 2021-02-08 (×2): qty 1

## 2021-02-08 MED ORDER — ADULT MULTIVITAMIN W/MINERALS CH
1.0000 | ORAL_TABLET | Freq: Every day | ORAL | Status: DC
  Administered 2021-02-09: 1 via ORAL
  Filled 2021-02-08: qty 1

## 2021-02-08 MED ORDER — PROTAMINE SULFATE 10 MG/ML IV SOLN
INTRAVENOUS | Status: DC | PRN
  Administered 2021-02-08: 50 mg via INTRAVENOUS

## 2021-02-08 MED ORDER — LEVOTHYROXINE SODIUM 50 MCG PO TABS
50.0000 ug | ORAL_TABLET | Freq: Every day | ORAL | Status: DC
  Administered 2021-02-09: 50 ug via ORAL
  Filled 2021-02-08: qty 1

## 2021-02-08 MED ORDER — FUROSEMIDE 20 MG PO TABS
20.0000 mg | ORAL_TABLET | Freq: Every day | ORAL | Status: DC
  Administered 2021-02-09: 20 mg via ORAL
  Filled 2021-02-08: qty 1

## 2021-02-08 MED ORDER — TRAMADOL HCL 50 MG PO TABS: 50.0000 mg | ORAL_TABLET | Freq: Four times a day (QID) | ORAL | Status: DC | PRN

## 2021-02-08 MED ORDER — ACETAMINOPHEN 325 MG PO TABS: 325.0000 mg | ORAL_TABLET | ORAL | Status: DC | PRN

## 2021-02-08 MED ORDER — ISOSORBIDE MONONITRATE ER 60 MG PO TB24
60.0000 mg | ORAL_TABLET | Freq: Every day | ORAL | Status: DC
  Administered 2021-02-09: 60 mg via ORAL
  Filled 2021-02-08: qty 1

## 2021-02-08 MED ORDER — MORPHINE SULFATE (PF) 2 MG/ML IV SOLN: 2.0000 mg | INTRAVENOUS | Status: DC | PRN

## 2021-02-08 MED ORDER — OXYCODONE HCL 5 MG/5ML PO SOLN: 5.0000 mg | Freq: Once | ORAL | Status: AC | PRN

## 2021-02-08 MED ORDER — ORAL CARE MOUTH RINSE: 15.0000 mL | Freq: Once | OROMUCOSAL | Status: AC

## 2021-02-08 MED ORDER — ROCURONIUM BROMIDE 10 MG/ML (PF) SYRINGE
PREFILLED_SYRINGE | INTRAVENOUS | Status: DC | PRN
  Administered 2021-02-08: 60 mg via INTRAVENOUS

## 2021-02-08 MED ORDER — PHENOL 1.4 % MT LIQD: 1.0000 | OROMUCOSAL | Status: DC | PRN

## 2021-02-08 MED ORDER — PROPOFOL 10 MG/ML IV BOLUS
INTRAVENOUS | Status: DC | PRN
  Administered 2021-02-08: 150 mg via INTRAVENOUS

## 2021-02-08 MED ORDER — PHENYLEPHRINE 40 MCG/ML (10ML) SYRINGE FOR IV PUSH (FOR BLOOD PRESSURE SUPPORT)
PREFILLED_SYRINGE | INTRAVENOUS | Status: AC
  Filled 2021-02-08: qty 10

## 2021-02-08 MED ORDER — ONDANSETRON HCL 4 MG/2ML IJ SOLN: 4.0000 mg | Freq: Four times a day (QID) | INTRAMUSCULAR | Status: DC | PRN

## 2021-02-08 MED ORDER — ATORVASTATIN CALCIUM 80 MG PO TABS
80.0000 mg | ORAL_TABLET | Freq: Every day | ORAL | Status: DC
  Administered 2021-02-09: 80 mg via ORAL
  Filled 2021-02-08: qty 1

## 2021-02-08 MED ORDER — ASCORBIC ACID 500 MG PO TABS
500.0000 mg | ORAL_TABLET | Freq: Every day | ORAL | Status: DC
  Administered 2021-02-09: 500 mg via ORAL
  Filled 2021-02-08: qty 1

## 2021-02-08 MED ORDER — SENNOSIDES-DOCUSATE SODIUM 8.6-50 MG PO TABS: 1.0000 | ORAL_TABLET | Freq: Every evening | ORAL | Status: DC | PRN

## 2021-02-08 MED ORDER — GLYCOPYRROLATE PF 0.2 MG/ML IJ SOSY
PREFILLED_SYRINGE | INTRAMUSCULAR | Status: AC
  Filled 2021-02-08: qty 1

## 2021-02-08 MED ORDER — DEXAMETHASONE SODIUM PHOSPHATE 10 MG/ML IJ SOLN
INTRAMUSCULAR | Status: DC | PRN
  Administered 2021-02-08: 10 mg via INTRAVENOUS

## 2021-02-08 MED ORDER — CHLORHEXIDINE GLUCONATE 0.12 % MT SOLN
15.0000 mL | Freq: Once | OROMUCOSAL | Status: AC
  Administered 2021-02-08: 15 mL via OROMUCOSAL
  Filled 2021-02-08: qty 15

## 2021-02-08 MED ORDER — LORATADINE 10 MG PO TABS
10.0000 mg | ORAL_TABLET | Freq: Every day | ORAL | Status: DC
  Administered 2021-02-09: 10 mg via ORAL
  Filled 2021-02-08: qty 1

## 2021-02-08 MED ORDER — LIDOCAINE 2% (20 MG/ML) 5 ML SYRINGE
INTRAMUSCULAR | Status: AC
  Filled 2021-02-08: qty 5

## 2021-02-08 MED ORDER — FENTANYL CITRATE (PF) 250 MCG/5ML IJ SOLN
INTRAMUSCULAR | Status: AC
  Filled 2021-02-08: qty 5

## 2021-02-08 MED ORDER — SODIUM CHLORIDE 0.9 % IV SOLN: INTRAVENOUS | Status: DC

## 2021-02-08 MED ORDER — CHLORHEXIDINE GLUCONATE CLOTH 2 % EX PADS: 6.0000 | MEDICATED_PAD | Freq: Once | CUTANEOUS | Status: DC

## 2021-02-08 MED ORDER — DIPHENHYDRAMINE HCL 25 MG PO CAPS
50.0000 mg | ORAL_CAPSULE | Freq: Once | ORAL | Status: AC
  Administered 2021-02-08: 50 mg via ORAL
  Filled 2021-02-08: qty 2

## 2021-02-08 MED ORDER — CLOPIDOGREL BISULFATE 75 MG PO TABS
75.0000 mg | ORAL_TABLET | Freq: Every day | ORAL | Status: DC
  Administered 2021-02-09: 75 mg via ORAL
  Filled 2021-02-08: qty 1

## 2021-02-08 MED ORDER — LACTATED RINGERS IV SOLN: INTRAVENOUS | Status: DC | PRN

## 2021-02-08 MED ORDER — TAMSULOSIN HCL 0.4 MG PO CAPS
0.4000 mg | ORAL_CAPSULE | Freq: Every day | ORAL | Status: DC
  Administered 2021-02-08: 0.4 mg via ORAL
  Filled 2021-02-08: qty 1

## 2021-02-08 MED ORDER — PANTOPRAZOLE SODIUM 40 MG PO TBEC
40.0000 mg | DELAYED_RELEASE_TABLET | Freq: Every day | ORAL | Status: DC
  Administered 2021-02-09: 40 mg via ORAL
  Filled 2021-02-08: qty 1

## 2021-02-08 MED ORDER — LACTATED RINGERS IV SOLN: INTRAVENOUS | Status: DC

## 2021-02-08 MED ORDER — PHENYLEPHRINE 40 MCG/ML (10ML) SYRINGE FOR IV PUSH (FOR BLOOD PRESSURE SUPPORT)
PREFILLED_SYRINGE | INTRAVENOUS | Status: DC | PRN
  Administered 2021-02-08: 80 ug via INTRAVENOUS

## 2021-02-08 MED ORDER — ROCURONIUM BROMIDE 10 MG/ML (PF) SYRINGE
PREFILLED_SYRINGE | INTRAVENOUS | Status: AC
  Filled 2021-02-08: qty 10

## 2021-02-08 MED ORDER — GUAIFENESIN-DM 100-10 MG/5ML PO SYRP: 15.0000 mL | ORAL_SOLUTION | ORAL | Status: DC | PRN

## 2021-02-08 MED ORDER — EPHEDRINE SULFATE-NACL 50-0.9 MG/10ML-% IV SOSY
PREFILLED_SYRINGE | INTRAVENOUS | Status: DC | PRN
  Administered 2021-02-08: 5 mg via INTRAVENOUS
  Administered 2021-02-08: 10 mg via INTRAVENOUS
  Administered 2021-02-08: 5 mg via INTRAVENOUS
  Administered 2021-02-08 (×3): 10 mg via INTRAVENOUS

## 2021-02-08 MED ORDER — PHENYLEPHRINE HCL-NACL 10-0.9 MG/250ML-% IV SOLN
INTRAVENOUS | Status: DC | PRN
  Administered 2021-02-08: 20 ug/min via INTRAVENOUS

## 2021-02-08 MED ORDER — EPHEDRINE 5 MG/ML INJ
INTRAVENOUS | Status: AC
  Filled 2021-02-08: qty 10

## 2021-02-08 MED ORDER — LABETALOL HCL 5 MG/ML IV SOLN: 10.0000 mg | INTRAVENOUS | Status: DC | PRN

## 2021-02-08 MED ORDER — ACETAMINOPHEN 650 MG RE SUPP
325.0000 mg | RECTAL | Status: DC | PRN
Start: 2021-02-08 — End: 2021-02-09

## 2021-02-08 MED ORDER — HEPARIN SODIUM (PORCINE) 1000 UNIT/ML IJ SOLN
INTRAMUSCULAR | Status: AC
  Filled 2021-02-08: qty 1

## 2021-02-08 MED ORDER — 0.9 % SODIUM CHLORIDE (POUR BTL) OPTIME
TOPICAL | Status: DC | PRN
  Administered 2021-02-08: 1000 mL

## 2021-02-08 MED ORDER — SUGAMMADEX SODIUM 200 MG/2ML IV SOLN
INTRAVENOUS | Status: DC | PRN
  Administered 2021-02-08: 200 mg via INTRAVENOUS

## 2021-02-08 MED ORDER — SODIUM CHLORIDE 0.9 % IV SOLN: 500.0000 mL | Freq: Once | INTRAVENOUS | Status: DC | PRN

## 2021-02-08 MED ORDER — OXYCODONE HCL 5 MG PO TABS
ORAL_TABLET | ORAL | Status: AC
  Filled 2021-02-08: qty 1

## 2021-02-08 MED ORDER — DOCUSATE SODIUM 100 MG PO CAPS
100.0000 mg | ORAL_CAPSULE | Freq: Every day | ORAL | Status: DC
  Administered 2021-02-09: 100 mg via ORAL
  Filled 2021-02-08: qty 1

## 2021-02-08 MED ORDER — ASPIRIN EC 81 MG PO TBEC
81.0000 mg | DELAYED_RELEASE_TABLET | Freq: Every day | ORAL | Status: DC
  Administered 2021-02-09: 81 mg via ORAL
  Filled 2021-02-08: qty 1

## 2021-02-08 MED ORDER — FENTANYL CITRATE (PF) 100 MCG/2ML IJ SOLN
INTRAMUSCULAR | Status: DC | PRN
  Administered 2021-02-08 (×5): 50 ug via INTRAVENOUS

## 2021-02-08 MED ORDER — ONDANSETRON HCL 4 MG/2ML IJ SOLN
INTRAMUSCULAR | Status: AC
  Filled 2021-02-08: qty 2

## 2021-02-08 MED ORDER — SODIUM CHLORIDE 0.9 % IV SOLN
INTRAVENOUS | Status: AC
  Filled 2021-02-08: qty 1.2

## 2021-02-08 MED ORDER — LIDOCAINE HCL 1 % IJ SOLN
INTRAMUSCULAR | Status: AC
  Filled 2021-02-08: qty 20

## 2021-02-08 MED ORDER — LIDOCAINE 2% (20 MG/ML) 5 ML SYRINGE
INTRAMUSCULAR | Status: DC | PRN
  Administered 2021-02-08: 60 mg via INTRAVENOUS

## 2021-02-08 MED ORDER — SODIUM CHLORIDE 0.9 % IV BOLUS
500.0000 mL | Freq: Once | INTRAVENOUS | Status: AC
  Administered 2021-02-08: 500 mL via INTRAVENOUS

## 2021-02-08 MED ORDER — ONDANSETRON HCL 4 MG/2ML IJ SOLN
INTRAMUSCULAR | Status: DC | PRN
  Administered 2021-02-08: 4 mg via INTRAVENOUS

## 2021-02-08 MED ORDER — OXYCODONE HCL 5 MG PO TABS
5.0000 mg | ORAL_TABLET | Freq: Once | ORAL | Status: AC | PRN
  Administered 2021-02-08: 5 mg via ORAL

## 2021-02-08 MED ORDER — PANTOPRAZOLE SODIUM 40 MG PO TBEC: 40.0000 mg | DELAYED_RELEASE_TABLET | Freq: Every day | ORAL | Status: DC

## 2021-02-08 SURGICAL SUPPLY — 54 items
ADH SKN CLS APL DERMABOND .7 (GAUZE/BANDAGES/DRESSINGS) ×2
AGENT HMST MTR 8 SURGIFLO (HEMOSTASIS) ×1
BAG BANDED W/RUBBER/TAPE 36X54 (MISCELLANEOUS) ×2 IMPLANT
BAG EQP BAND 135X91 W/RBR TAPE (MISCELLANEOUS) ×1
BALLN STERLING RX 6X30X80 (BALLOONS) ×2
BALLOON STERLING RX 6X30X80 (BALLOONS) IMPLANT
CANISTER SUCT 3000ML PPV (MISCELLANEOUS) ×2 IMPLANT
CATH SUCT 10FR WHISTLE TIP (CATHETERS) ×2 IMPLANT
CLIP VESOCCLUDE MED 6/CT (CLIP) ×2 IMPLANT
CLIP VESOCCLUDE SM WIDE 6/CT (CLIP) ×2 IMPLANT
COVER DOME SNAP 22 D (MISCELLANEOUS) ×2 IMPLANT
COVER PROBE W GEL 5X96 (DRAPES) ×2 IMPLANT
DERMABOND ADVANCED (GAUZE/BANDAGES/DRESSINGS) ×2
DERMABOND ADVANCED .7 DNX12 (GAUZE/BANDAGES/DRESSINGS) ×1 IMPLANT
DRAPE FEMORAL ANGIO 80X135IN (DRAPES) ×2 IMPLANT
ELECT REM PT RETURN 9FT ADLT (ELECTROSURGICAL) ×2
ELECTRODE REM PT RTRN 9FT ADLT (ELECTROSURGICAL) ×1 IMPLANT
GLOVE BIOGEL PI IND STRL 7.5 (GLOVE) ×1 IMPLANT
GLOVE BIOGEL PI INDICATOR 7.5 (GLOVE) ×1
GLOVE SURG SS PI 7.5 STRL IVOR (GLOVE) ×2 IMPLANT
GOWN STRL REUS W/ TWL LRG LVL3 (GOWN DISPOSABLE) ×2 IMPLANT
GOWN STRL REUS W/ TWL XL LVL3 (GOWN DISPOSABLE) ×1 IMPLANT
GOWN STRL REUS W/TWL LRG LVL3 (GOWN DISPOSABLE) ×4
GOWN STRL REUS W/TWL XL LVL3 (GOWN DISPOSABLE) ×2
GUIDEWIRE ENROUTE 0.014 (WIRE) ×2 IMPLANT
HEMOSTAT SNOW SURGICEL 2X4 (HEMOSTASIS) IMPLANT
INTRODUCER KIT GALT 7CM (INTRODUCER) ×2
KIT BASIN OR (CUSTOM PROCEDURE TRAY) ×2 IMPLANT
KIT ENCORE 26 ADVANTAGE (KITS) ×2 IMPLANT
KIT INTRODUCER GALT 7 (INTRODUCER) ×1 IMPLANT
KIT TURNOVER KIT B (KITS) ×2 IMPLANT
NDL HYPO 25GX1X1/2 BEV (NEEDLE) IMPLANT
NEEDLE HYPO 25GX1X1/2 BEV (NEEDLE) IMPLANT
PACK CAROTID (CUSTOM PROCEDURE TRAY) ×2 IMPLANT
POSITIONER HEAD DONUT 9IN (MISCELLANEOUS) ×2 IMPLANT
SET MICROPUNCTURE 5F STIFF (MISCELLANEOUS) IMPLANT
SPOGE SURGIFLO 8M (HEMOSTASIS) ×2
SPONGE SURGIFLO 8M (HEMOSTASIS) IMPLANT
STENT TRANSCAROTID SYS 10X40 (Permanent Stent) ×1 IMPLANT
SUT PROLENE 5 0 C 1 24 (SUTURE) ×2 IMPLANT
SUT PROLENE 6 0 BV (SUTURE) IMPLANT
SUT SILK 2 0 PERMA HAND 18 BK (SUTURE) ×2 IMPLANT
SUT SILK 2 0 SH (SUTURE) ×2 IMPLANT
SUT VIC AB 3-0 SH 27 (SUTURE) ×4
SUT VIC AB 3-0 SH 27X BRD (SUTURE) ×2 IMPLANT
SUT VIC AB 4-0 PS2 27 (SUTURE) ×2 IMPLANT
SYR 10ML LL (SYRINGE) ×6 IMPLANT
SYR 20ML LL LF (SYRINGE) ×3 IMPLANT
SYR CONTROL 10ML LL (SYRINGE) ×1 IMPLANT
SYSTEM TRANSCAROTID NEUROPRTCT (MISCELLANEOUS) ×1 IMPLANT
TOWEL GREEN STERILE (TOWEL DISPOSABLE) ×2 IMPLANT
TRANSCAROTID NEUROPROTECT SYS (MISCELLANEOUS) ×2
WATER STERILE IRR 1000ML POUR (IV SOLUTION) ×2 IMPLANT
WIRE BENTSON .035X145CM (WIRE) ×2 IMPLANT

## 2021-02-08 NOTE — Anesthesia Procedure Notes (Signed)
Arterial Line Insertion Start/End5/20/2022 7:15 AM, 02/08/2021 7:20 AM Performed by: Albertha Ghee, MD, Genelle Bal, CRNA, CRNA  Patient location: Pre-op. Preanesthetic checklist: patient identified, IV checked, site marked, risks and benefits discussed, surgical consent, monitors and equipment checked, pre-op evaluation, timeout performed and anesthesia consent Lidocaine 1% used for infiltration Left, radial was placed Catheter size: 20 Fr Hand hygiene performed  and maximum sterile barriers used   Attempts: 2 Procedure performed without using ultrasound guided technique. Following insertion, dressing applied. Post procedure assessment: normal and unchanged  Patient tolerated the procedure well with no immediate complications.

## 2021-02-08 NOTE — Progress Notes (Signed)
--  Status post left sided TCAR  Patient seen and examined in the holding area postprocedure.  He is conversant and answering questions appropriately.  He has no neurologic deficits.  The groin site and left neck are without hematoma  -The patient is stable for transfer to 4 E.  -I anticipate the patient will be able to be discharged home on Saturday.  He will take aspirin and Plavix for 1 month and this will be discontinued.  He will restart his Eliquis tomorrow morning if there are no signs of bleeding.  He will continue his statin.  Eric Gordon

## 2021-02-08 NOTE — Brief Op Note (Signed)
02/08/2021  11:01 AM  PATIENT:  Eric Gordon  87 y.o. male  PRE-OPERATIVE DIAGNOSIS:  BILATERAL CAROTID STENOSIS  POST-OPERATIVE DIAGNOSIS:  BILATERAL CAROTID STENOSIS  PROCEDURE:  Procedure(s): LEFT TRANSCAROTID ARTERY REVASCULARIZATION (Left)  SURGEON:  Surgeon(s) and Role:    * Serafina Mitchell, MD - Primary    * Early, Arvilla Meres, MD - Assisting  PHYSICIAN ASSISTANT:   ASSISTANTS: Dr. Hedy Camara ANESTHESIA:   general  EBL:  20 mL   BLOOD ADMINISTERED:none  DRAINS: none   LOCAL MEDICATIONS USED:  NONE  SPECIMEN:  No Specimen  DISPOSITION OF SPECIMEN:  N/A  COUNTS:  YES  TOURNIQUET:  * No tourniquets in log *  DICTATION: .Dragon Dictation  PLAN OF CARE: Admit to inpatient   PATIENT DISPOSITION:  PACU - hemodynamically stable.   Delay start of Pharmacological VTE agent (>24hrs) due to surgical blood loss or risk of bleeding: yes

## 2021-02-08 NOTE — Anesthesia Postprocedure Evaluation (Signed)
Anesthesia Post Note  Patient: Eric Gordon  Procedure(s) Performed: LEFT TRANSCAROTID ARTERY REVASCULARIZATION (Left Neck)     Patient location during evaluation: PACU Anesthesia Type: General Level of consciousness: awake and alert Pain management: pain level controlled Vital Signs Assessment: post-procedure vital signs reviewed and stable Respiratory status: spontaneous breathing, nonlabored ventilation, respiratory function stable and patient connected to nasal cannula oxygen Cardiovascular status: blood pressure returned to baseline and stable Postop Assessment: no apparent nausea or vomiting Anesthetic complications: no   No complications documented.  Last Vitals:  Vitals:   02/08/21 1240 02/08/21 1319  BP: 97/62 (!) 102/53  Pulse: 61 76  Resp: 18 18  Temp:  36.4 C  SpO2: 97% 92%    Last Pain:  Vitals:   02/08/21 1319  TempSrc: Oral  PainSc:                  [place]

## 2021-02-08 NOTE — Progress Notes (Signed)
Patient refused use of CPAP for the evening 

## 2021-02-08 NOTE — Progress Notes (Signed)
Pt's SBP was less than 90, Dr. Scot Dock notified new order received. We'll continue to monitor.

## 2021-02-08 NOTE — Op Note (Signed)
Patient name: Eric Gordon MRN: [id number] DOB: [birth year] Sex: male  02/08/2021 Pre-operative Diagnosis: Asymptomatic left carotid stenosis Post-operative diagnosis:  Same Surgeon:  [name] Assistants: Dr. Donnetta Hutching, MD Procedure:   #1: Left transthoracic artery revascularization (carotid)   #2: Flow reversal neuro protection   #3: Ultrasound-guided access, right femoral vein Anesthesia: General Blood Loss: Minimal Specimens: None  Findings: Approximate 90% stenosis, resolved after stenting.  The flow reversal filter was filled with small particulate matter  Stent: ENROUTE 10x40 Predilation balloon: 6 x 30 Contrast: 24 cc There is an area 1884.2 Fluoroscopy: 2.9 minutes Procedure time: 50 minutes  Indications: This is an 87 year old gentleman who has previously undergone right sided TCAR who comes in today for treatment of his left side.  He is asymptomatic.  Procedure:  The patient was identified in the holding area and taken to [place] 16  The patient was then placed supine on the table. general anesthesia was administered.  The patient was prepped and draped in the usual sterile fashion.  A time out was called and antibiotics were administered.  A assistant was necessary to expedite the procedure and assist with technical details.  Ultrasound was used to evaluate the right common femoral vein which was widely patent and easily compressible.  The right common femoral vein was then cannulated under ultrasound guidance with a micropuncture needle.  A 018 wire was advanced without resistance and a micropuncture sheath was placed.  The dilator and wire were removed and an 035 wire was inserted.  The micropuncture sheath was removed and the TCAR sheath was inserted.  This flushed easily.  Next the carotid artery on the left was identified above the clavicle with ultrasound.  I then made a transverse incision approximately a fingerbreadth above the clavicle.  Cautery was  used about the subcutaneous tissue and platysma muscle.  I identified the 2 heads of the sternocleidomastoid and developed a avascular plane between them.  The vagus nerve was identified on the anterior lateral side of the carotid artery and was protected.  I then circumferentially exposed the left common carotid artery and encircled it with a Potts vessel loop and an umbilical tape.  The patient was fully heparinized.  A 5-0 Prolene U stitch was placed in the adventitia of the carotid artery.  I then cannulated the left common carotid artery with a micropuncture needle and inserted the TCAR 018 wire up to the mark.  The needle was removed and a micropuncture sheath was placed.  This was connected to tubing.  I then performed angiography and identified the carotid bifurcation.  This was marked on the screen.  The carotid Amplatz wire was then inserted up to the mark on the screen.  The micropuncture sheath was removed and the TCAR sheath was inserted under fluoroscopic visualization making sure that the carotid Amplatz did not cross the mark on the screen.  Once the sheath was in good position, the dilator and carotid Amplatz wire were removed.  The sheath was then sutured into position with 2-0 silk suture x2.  Next the flow reversal tubing was connected.  Passive flow reversal was confirmed with a saline flush.  Next a TCAR timeout was performed.  At this point the ACT was 220.  I gave an additional bolus of heparin and checked another ACT which did not change.  I then gave another 1000 units of heparin and elected to proceed.  Hemodynamics were in the appropriate range.  The vessel loop  was tightened on the common carotid artery.  Active flow reversal was confirmed with a saline flush.  Next the image detector was rotated to a 15 degree LAO projection.  A contrast injection was performed which opened up the carotid bifurcation.  The 6 x 30 predilatation balloon and an 018 wire were inserted.  The wire was then  advanced across the lesion into the distal internal carotid artery.  The lesion was then predilated taking the balloon to 12 atm.  There was a moderate hemodynamic response with heart rate drop of approximately 20 beats.  This returned to baseline once the balloon was deflated.  Next, the stent was prepared and inserted.  This was a ENROUTE 10 x 40 stent.  It was positioned appropriately and then deployed.  The proximal end of the stent ended up at the tip of the sheath, as planned.  I then waited approximately 2 minutes and performed angiography.  The initial arteriogram showed what appeared to be a stenosis within the stent.  A second view was obtained which showed the same findings.  I felt this was unusual because on fluoroscopy the stent appeared to be fully expanded.  At this point I realized that the sheath was at the proximal edge of the stent and contrast was going behind the stent.  Therefore, I cut one of the sutures holding the sheath in place and backed the sheath up another centimeter.  I repeated the arteriogram, this time it showed that the stent was widely patent.  There was no evidence of dissection.  I was satisfied with these results.  The wire was removed.  Active flow reversal was discontinued and the blood in the tubing was returned via the femoral sheath.  Next, the TCAR sheath was removed from the common carotid artery and the previously placed 5-0 Prolene U stitch was used to close the arteriotomy.  An additional 6-0 Prolene was used for hemostasis.  Hand-held Doppler was used to evaluate the signal in the common carotid artery which was brisk and appropriate.  I then gave 50 mg of protamine to reverse the heparin.  The sheath in the right groin was removed and manual pressure was held for 10 minutes.  The carotid incision was irrigated.  Hemostasis was achieved.  Surgi-Flo was placed as a topical hemostatic agent.  Once I was satisfied, the platysma muscle was reapproximated with a running  3-0 Vicryl.  The skin was closed with a 3-0 Vicryl followed by Dermabond.  The patient was successfully extubated and taken to recovery in stable condition, neurologically intact.  There were no immediate complications.   Disposition: To PACU stable and neurologically intact   V. [name], M.D., [facility] Office: [phone] Pager:  [phone]

## 2021-02-08 NOTE — Interval H&P Note (Signed)
History and Physical Interval Note:  02/08/2021 7:24 AM  Eric Gordon  has presented today for surgery, with the diagnosis of BILATERAL CAROTID STENOSIS.  The various methods of treatment have been discussed with the patient and family. After consideration of risks, benefits and other options for treatment, the patient has consented to  Procedure(s): LEFT TRANSCAROTID ARTERY REVASCULARIZATION (Left) as a surgical intervention.  The patient's history has been reviewed, patient examined, no change in status, stable for surgery.  I have reviewed the patient's chart and labs.  Questions were answered to the patient's satisfaction.     [name]

## 2021-02-08 NOTE — Transfer of Care (Signed)
Immediate Anesthesia Transfer of Care Note  Patient: Eric Gordon  Procedure(s) Performed: LEFT TRANSCAROTID ARTERY REVASCULARIZATION (Left Neck)  Patient Location: PACU  Anesthesia Type:General  Level of Consciousness: awake, alert  and oriented  Airway & Oxygen Therapy: Patient Spontanous Breathing and Patient connected to face mask oxygen  Post-op Assessment: Report given to RN and Post -op Vital signs reviewed and stable  Post vital signs: Reviewed and stable  Last Vitals:  Vitals Value Taken Time  BP 115/59 02/08/21 0940  Temp    Pulse 75 02/08/21 0944  Resp 17 02/08/21 0944  SpO2 96 % 02/08/21 0944  Vitals shown include unvalidated device data.  Last Pain:  Vitals:   02/08/21 0605  TempSrc:   PainSc: 0-No pain         Complications: No complications documented.

## 2021-02-08 NOTE — Anesthesia Procedure Notes (Signed)
Procedure Name: Intubation Date/Time: 02/08/2021 7:40 AM Performed by: Genelle Bal, CRNA Pre-anesthesia Checklist: Patient identified, Emergency Drugs available, Suction available and Patient being monitored Patient Re-evaluated:Patient Re-evaluated prior to induction Oxygen Delivery Method: Circle system utilized Preoxygenation: Pre-oxygenation with 100% oxygen Induction Type: IV induction Ventilation: Mask ventilation without difficulty Laryngoscope Size: Miller and 2 Grade View: Grade I Tube type: Oral Tube size: 7.5 mm Number of attempts: 1 Airway Equipment and Method: Stylet and Oral airway Placement Confirmation: ETT inserted through vocal cords under direct vision,  positive ETCO2 and breath sounds checked- equal and bilateral Secured at: 23 cm Tube secured with: Tape Dental Injury: Teeth and Oropharynx as per pre-operative assessment

## 2021-02-09 LAB — CBC
HCT: 35.9 % — ABNORMAL LOW (ref 39.0–52.0)
Hemoglobin: 11.2 g/dL — ABNORMAL LOW (ref 13.0–17.0)
MCH: 30.4 pg (ref 26.0–34.0)
MCHC: 31.2 g/dL (ref 30.0–36.0)
MCV: 97.6 fL (ref 80.0–100.0)
Platelets: 125 10*3/uL — ABNORMAL LOW (ref 150–400)
RBC: 3.68 MIL/uL — ABNORMAL LOW (ref 4.22–5.81)
RDW: 14.7 % (ref 11.5–15.5)
WBC: 12.5 10*3/uL — ABNORMAL HIGH (ref 4.0–10.5)
nRBC: 0 % (ref 0.0–0.2)

## 2021-02-09 LAB — BASIC METABOLIC PANEL
Anion gap: 6 (ref 5–15)
BUN: 23 mg/dL (ref 8–23)
CO2: 26 mmol/L (ref 22–32)
Calcium: 8.6 mg/dL — ABNORMAL LOW (ref 8.9–10.3)
Chloride: 108 mmol/L (ref 98–111)
Creatinine, Ser: 1.22 mg/dL (ref 0.61–1.24)
GFR, Estimated: 57 mL/min — ABNORMAL LOW (ref >60)
Glucose, Bld: 119 mg/dL — ABNORMAL HIGH (ref 70–99)
Potassium: 4.1 mmol/L (ref 3.5–5.1)
Sodium: 140 mmol/L (ref 135–145)

## 2021-02-09 LAB — LIPID PANEL
Cholesterol: 92 mg/dL (ref 0–200)
HDL: 33 mg/dL — ABNORMAL LOW (ref >40)
LDL Cholesterol: 43 mg/dL (ref 0–99)
Total CHOL/HDL Ratio: 2.8 RATIO
Triglycerides: 78 mg/dL (ref <150)
VLDL: 16 mg/dL (ref 0–40)

## 2021-02-09 MED ORDER — APIXABAN 2.5 MG PO TABS: 2.5000 mg | ORAL_TABLET | Freq: Two times a day (BID) | ORAL | 1 refills | Status: DC

## 2021-02-09 MED ORDER — TRAMADOL HCL 50 MG PO TABS: 50.0000 mg | ORAL_TABLET | Freq: Four times a day (QID) | ORAL | 0 refills | Status: DC | PRN

## 2021-02-09 NOTE — Discharge Instructions (Signed)
   [facility] [place]  Discharge Instructions   Carotid Endarterectomy (CEA)  Please refer to the following instructions for your post-procedure care. Your surgeon or physician assistant will discuss any changes with you.  Activity  You are encouraged to walk as much as you can. You can slowly return to normal activities but must avoid strenuous activity and heavy lifting until your doctor tell you it's OK. Avoid activities such as vacuuming or swinging a golf club. You can drive after one week if you are comfortable and you are no longer taking prescription pain medications. It is normal to feel tired for serval weeks after your surgery. It is also normal to have difficulty with sleep habits, eating, and bowel movements after surgery. These will go away with time.  Bathing/Showering  You may shower after you come home. Do not soak in a bathtub, hot tub, or swim until the incision heals completely.  Incision Care  Shower every day. Clean your incision with mild soap and water. Pat the area dry with a clean towel. You do not need a bandage unless otherwise instructed. Do not apply any ointments or creams to your incision. You may have skin glue on your incision. Do not peel it off. It will come off on its own in about one week. Your incision may feel thickened and raised for several weeks after your surgery. This is normal and the skin will soften over time. For Men Only: It's OK to shave around the incision but do not shave the incision itself for 2 weeks. It is common to have numbness under your chin that could last for several months.  Diet  Resume your normal diet. There are no special food restrictions following this procedure. A low fat/low cholesterol diet is recommended for all patients with vascular disease. In order to heal from your surgery, it is CRITICAL to get adequate nutrition. Your body requires vitamins, minerals, and protein. Vegetables are the best  source of vitamins and minerals. Vegetables also provide the perfect balance of protein. Processed food has little nutritional value, so try to avoid this.        Medications  Resume taking all of your medications unless your doctor or physician assistant tells you not to. If your incision is causing pain, you may take over-the- counter pain relievers such as acetaminophen (Tylenol). If you were prescribed a stronger pain medication, please be aware these medications can cause nausea and constipation. Prevent nausea by taking the medication with a snack or meal. Avoid constipation by drinking plenty of fluids and eating foods with a high amount of fiber, such as fruits, vegetables, and grains. Do not take Tylenol if you are taking prescription pain medications.  Follow Up  Our office will schedule a follow up appointment 2-3 weeks following discharge.  Please call us immediately for any of the following conditions  Increased pain, redness, drainage (pus) from your incision site. Fever of 101 degrees or higher. If you should develop stroke (slurred speech, difficulty swallowing, weakness on one side of your body, loss of vision) you should call 911 and go to the nearest emergency room.  Reduce your risk of vascular disease:  Stop smoking. If you would like help call [facility] at 1-800-QUIT-NOW (1-800-784-8669) or [place] at 336-586-4000. Manage your cholesterol Maintain a desired weight Control your diabetes Keep your blood pressure down  If you have any questions, please call the office at [phone].   

## 2021-02-09 NOTE — Progress Notes (Deleted)
Discharge Summary     Eric Gordon 08/31/1931 87 y.o. male  [id number]  Admission Date: 02/08/2021  Discharge Date: 02/09/21  Physician: Serafina Mitchell, MD  Admission Diagnosis: Carotid stenosis [I65.29]  Discharge Day services:   See progress note 02/09/2021  Hospital Course:  Mr. Eric Gordon is a 87 year old male who was brought in as an outpatient and underwent left transcarotid artery revascularization by Dr. Trula Slade on 02/08/2021.  This was performed due to asymptomatic high-grade stenosis of the left internal carotid artery.  He tolerated procedure well and was admitted to the hospital postoperatively.  He did not have any neurological events overnight or postoperative day #1.  He is ready for discharge home.  He will resume his regular dose of Eliquis.  He will also resume Plavix for a duration of 1 month.  He will be prescribed 2 to 3 days of tramadol for continued postoperative pain control.  He will follow-up in office in 4 weeks with a carotid duplex.  Discharge instructions were reviewed with the patient and he voiced his understanding.  He will be discharged home this morning in stable condition.   Recent Labs    02/09/21 0542  NA 140  K 4.1  CL 108  CO2 26  GLUCOSE 119*  BUN 23  CALCIUM 8.6*   Recent Labs    02/09/21 0542  WBC 12.5*  HGB 11.2*  HCT 35.9*  PLT 125*   No results for input(s): INR in the last 72 hours.     Discharge Diagnosis:  Carotid stenosis [I65.29]  Secondary Diagnosis: Patient Active Problem List   Diagnosis Date Noted  . Carotid stenosis 02/08/2021  . Status post carotid endarterectomy 09/13/2020  . Carotid artery stenosis, symptomatic, right 09/13/2020  . Tear of lateral meniscus of knee 05/04/2020  . Osteoarthritis of left knee 04/05/2020  . Pain in left knee 04/03/2020  . Pleural effusion 04/12/2019  . Pneumothorax on left 01/10/2019  . Pneumothorax, left 01/09/2019  . CKD (chronic kidney disease),  stage III ([place]) 01/09/2019  . Chronic diastolic CHF (congestive heart failure) ([place]) 01/09/2019  . Hypothyroidism 01/09/2019  . Pleural effusion on left 01/09/2019  . NSTEMI (non-ST elevated myocardial infarction) ([place]) 10/21/2018  . Acute diastolic CHF (congestive heart failure) ([place])   . Non-ST elevation (NSTEMI) myocardial infarction ([place]) 10/17/2018  . CAP (community acquired pneumonia) 03/19/2018  . Hoarseness 12/15/2013  . Mild aortic stenosis 11/03/2012  . Atrial fibrillation ([place]) 04/02/2011  . Carotid stenosis, asymptomatic 04/02/2011  . CAD (coronary artery disease) 01/10/2011  . Abdominal aortic aneurysm ([place]) 11/06/2010  . CHEST TIGHTNESS-PRESSURE-OTHER 11/01/2010  . Asthma, mild intermittent 04/10/2010  . DYSPNEA 02/26/2010  . OBSTRUCTIVE SLEEP APNEA 08/22/2008  . Essential hypertension 08/22/2008  . BARRETTS ESOPHAGUS 08/18/2008  . HIATAL HERNIA 08/18/2008  . DIVERTICULOSIS, COLON 08/18/2008  . COLONIC POLYPS, HX OF 08/18/2008   Past Medical History:  Diagnosis Date  . Abdominal aortic aneurysm (AAA) ([place])   . Achilles tendinitis of right lower extremity   . Arthritis    "my whole body" (03/19/2018)  . Atherosclerosis of coronary artery bypass graft w/o angina pectoris   . Atrial fibrillation ([place])   . Barrett's esophagus   . CAD (coronary artery disease)   . Carotid artery disease ([place])   . CHF (congestive heart failure) ([place])   . Chronic anticoagulation    PE  . Chronic lower back pain   . Dyspnea   . Essential tremor   . GERD (gastroesophageal reflux disease)   .  High cholesterol   . Hyperlipidemia   . Hyperplasia, prostate   . Hypertension   . Hypothyroidism   . Myocardial infarction [facility])    2 in Jan. 2019  . Nail dystrophy   . OSA on CPAP    uses CPAP  . Osteoarthrosis   . Pneumonia    "now and once before" (03/19/2018)  . Pulmonary embolism [facility])    April 2012 after CABG  . S/P CABG (coronary artery bypass graft)   . Sleep apnea   .  Spontaneous pneumothorax    left spontaneous pneumothorax/left pleural effusion, s/p chest tube 01/09/19; thoracentesis x2, s/p left VATS/tacl pleurodesis 04/12/19    Allergies as of 02/09/2021      Reactions   Ancef [cefazolin Sodium] Hives   Vancomycin Rash      Medication List    TAKE these medications   acetaminophen 500 MG tablet Commonly known as: TYLENOL Take 1,000 mg by mouth every 6 (six) hours as needed for moderate pain or headache.   Ascorbic Acid 500 MG Caps Take 500 mg by mouth daily.   atorvastatin 80 MG tablet Commonly known as: LIPITOR Take 1 tablet (80 mg total) by mouth daily.   carvedilol 3.125 MG tablet Commonly known as: Coreg Take 1 tablet (3.125 mg total) by mouth 2 (two) times daily.   CENTRUM SILVER PO Take 1 tablet by mouth daily.   cetirizine 10 MG tablet Commonly known as: ZYRTEC Take 10 mg by mouth daily.   cholecalciferol 1000 units tablet Commonly known as: VITAMIN D Take 1,000 Units by mouth every evening.   clopidogrel 75 MG tablet Commonly known as: Plavix Take 1 tablet (75 mg total) by mouth daily.   dapagliflozin propanediol 10 MG Tabs tablet Commonly known as: Farxiga Take 1 tablet (10 mg total) by mouth daily before breakfast.   furosemide 40 MG tablet Commonly known as: LASIX Take 0.5 tablets (20 mg total) by mouth daily.   isosorbide mononitrate 30 MG 24 hr tablet Commonly known as: IMDUR Take 2 tablets (60 mg total) by mouth daily.   levothyroxine 50 MCG tablet Commonly known as: SYNTHROID Take 50 mcg by mouth daily before breakfast.   pantoprazole 40 MG tablet Commonly known as: PROTONIX Take 40 mg by mouth daily.   tamsulosin 0.4 MG Caps capsule Commonly known as: FLOMAX Take 0.4 mg by mouth daily after supper.   traMADol 50 MG tablet Commonly known as: ULTRAM Take 1 tablet (50 mg total) by mouth every 6 (six) hours as needed for moderate pain.        Discharge Instructions:   [facility] Discharge Instructions Carotid Endarterectomy (CEA)  Please refer to the following instructions for your post-procedure care. Your surgeon or physician assistant will discuss any changes with you.  Activity  You are encouraged to walk as much as you can. You can slowly return to normal activities but must avoid strenuous activity and heavy lifting until your doctor tell you it's OK. Avoid activities such as vacuuming or swinging a golf club. You can drive after one week if you are comfortable and you are no longer taking prescription pain medications. It is normal to feel tired for serval weeks after your surgery. It is also normal to have difficulty with sleep habits, eating, and bowel movements after surgery. These will go away with time.  Bathing/Showering  You may shower after you come home. Do not soak in a bathtub, hot tub, or swim until  the incision heals completely.  Incision Care  Shower every day. Clean your incision with mild soap and water. Pat the area dry with a clean towel. You do not need a bandage unless otherwise instructed. Do not apply any ointments or creams to your incision. You may have skin glue on your incision. Do not peel it off. It will come off on its own in about one week. Your incision may feel thickened and raised for several weeks after your surgery. This is normal and the skin will soften over time. For Men Only: It's OK to shave around the incision but do not shave the incision itself for 2 weeks. It is common to have numbness under your chin that could last for several months.  Diet  Resume your normal diet. There are no special food restrictions following this procedure. A low fat/low cholesterol diet is recommended for all patients with vascular disease. In order to heal from your surgery, it is CRITICAL to get adequate nutrition. Your body requires vitamins, minerals, and protein. Vegetables are the best source of vitamins and  minerals. Vegetables also provide the perfect balance of protein. Processed food has little nutritional value, so try to avoid this.  Medications  Resume taking all of your medications unless your doctor or physician assistant tells you not to.  If your incision is causing pain, you may take over-the- counter pain relievers such as acetaminophen (Tylenol). If you were prescribed a stronger pain medication, please be aware these medications can cause nausea and constipation.  Prevent nausea by taking the medication with a snack or meal. Avoid constipation by drinking plenty of fluids and eating foods with a high amount of fiber, such as fruits, vegetables, and grains. Do not take Tylenol if you are taking prescription pain medications.  Follow Up  Our office will schedule a follow up appointment 2-3 weeks following discharge.  Please call us immediately for any of the following conditions  . Increased pain, redness, drainage (pus) from your incision site. . Fever of 101 degrees or higher. . If you should develop stroke (slurred speech, difficulty swallowing, weakness on one side of your body, loss of vision) you should call 911 and go to the nearest emergency room. .  Reduce your risk of vascular disease:  . Stop smoking. If you would like help call [facility] at 1-800-QUIT-NOW (312)263-2774) or [place] at [phone]. . Manage your cholesterol . Maintain a desired weight . Control your diabetes . Keep your blood pressure down .  If you have any questions, please call the office at [phone].  Disposition: home  Patient's condition: is Good  Follow up: 1. Dr. Trula Slade in 2 weeks.   Dagoberto Ligas, PA-C [facility] [phone]   --- For [facility] Registry use ---   Modified Rankin score at D/C (0-6): 0  IV medication needed for:  1. Hypertension: No 2. Hypotension: No  Post-op Complications: No  1. Post-op CVA or TIA: No  If yes: Event  classification (right eye, left eye, right cortical, left cortical, verterobasilar, other):   If yes: Timing of event (intra-op, <6 hrs post-op, >=6 hrs post-op, unknown):   2. CN injury: No  If yes: CN  injuried   3. Myocardial infarction: No  If yes: Dx by (EKG or clinical, Troponin):   4.  CHF: No  5.  Dysrhythmia (new): No  6. Wound infection: No  7. Reperfusion symptoms: No  8. Return to OR: No  If yes: return to  OR for (bleeding, neurologic, other CEA incision, other):   Discharge medications: Statin use:  Yes ASA use:  No   Beta blocker use:  Yes ACE-Inhibitor use:  No  ARB use:  No CCB use: No P2Y12 Antagonist use: Yes, [ x] Plavix, [ ]  Plasugrel, [ ]  Ticlopinine, [ ]  Ticagrelor, [ ]  Other, [ ]  No for medical reason, [ ]  Non-compliant, [ ]  Not-indicated Anti-coagulant use:  No, [ ]  Warfarin, [ ]  Rivaroxaban, [ ]  Dabigatran,

## 2021-02-09 NOTE — Progress Notes (Signed)
Mobility Specialist: Progress Note   02/09/21 1046  Mobility  Activity Ambulated in hall  Level of Assistance Independent after set-up  Assistive Device Front wheel walker  Distance Ambulated (ft) 240 ft  Mobility Ambulated independently in hallway  Mobility Response Tolerated well  Mobility performed by Mobility specialist  Bed Position Chair  $Mobility charge 1 Mobility   Pre-Mobility: 58 HR Post-Mobility: 72 HR  Pt asx during ambulation. Pt used RW as a precaution during ambulation but says he has a cane at home he uses only some of the time. Pt to chair after walk and is eager for discharge.   [facility] Debraann Livingstone Mobility Specialist Mobility Specialist Phone: [phone]

## 2021-02-09 NOTE — Progress Notes (Signed)
  Progress Note    02/09/2021 7:46 AM 1 Day Post-Op  Subjective:  L neck soreness.  Denies R sided weakness, slurring speech, or vision changes   Vitals:   02/09/21 0338 02/09/21 0725  BP: 101/65 103/65  Pulse: 68 71  Resp: 20 20  Temp: 97.6 F (36.4 C) 98.9 F (37.2 C)  SpO2: 95% 90%   Physical Exam: Lungs:  Non labored Incisions:  L neck incision and R groin cath site c/d/i Extremities:  Moving all extremities well Neurologic: a&O; CN grossly intact  CBC    Component Value Date/Time   WBC 12.5 (H) 02/09/2021 0542   RBC 3.68 (L) 02/09/2021 0542   HGB 11.2 (L) 02/09/2021 0542   HCT 35.9 (L) 02/09/2021 0542   PLT 125 (L) 02/09/2021 0542   MCV 97.6 02/09/2021 0542   MCH 30.4 02/09/2021 0542   MCHC 31.2 02/09/2021 0542   RDW 14.7 02/09/2021 0542   LYMPHSABS 1.5 03/08/2019 1228   MONOABS 0.6 03/08/2019 1228   EOSABS 0.3 03/08/2019 1228   BASOSABS 0.0 03/08/2019 1228    BMET    Component Value Date/Time   NA 140 02/09/2021 0542   K 4.1 02/09/2021 0542   CL 108 02/09/2021 0542   CO2 26 02/09/2021 0542   GLUCOSE 119 (H) 02/09/2021 0542   BUN 23 02/09/2021 0542   CREATININE 1.22 02/09/2021 0542   CALCIUM 8.6 (L) 02/09/2021 0542   GFRNONAA 57 (L) 02/09/2021 0542   GFRAA >60 02/21/2020 1147    INR    Component Value Date/Time   INR 1.2 01/31/2021 1152     Intake/Output Summary (Last 24 hours) at 02/09/2021 0746 Last data filed at 02/09/2021 0730 Gross per 24 hour  Intake 2510 ml  Output 845 ml  Net 1665 ml     Assessment/Plan:  87 y.o. male is s/p L TCAR for symptomatic stenosis 1 Day Post-Op   No neuro events overnight Incisions are unremarkable this morning Resume Eliquis; patient will also take plavix for 1 month Check carotid duplex in 4 weeks; d/c home today   Dagoberto Ligas, PA-C [facility] [phone] 02/09/2021 7:46 AM

## 2021-02-10 NOTE — Discharge Summary (Signed)
Discharge Summary     Eric Gordon 12-13-1930 87 y.o. male  [id number]  Admission Date: 02/08/2021  Discharge Date: 02/09/21  Physician: Serafina Mitchell, MD  Admission Diagnosis: Carotid stenosis [I65.29]  Discharge Day services:   See progress note 02/09/2021  Hospital Course:  Eric Gordon is a 87 year old male who was brought in as an outpatient and underwent left transcarotid artery revascularization by Dr. Trula Slade on 02/08/2021.  This was performed due to asymptomatic high-grade stenosis of the left internal carotid artery.  He tolerated procedure well and was admitted to the hospital postoperatively.  He did not have any neurological events overnight or postoperative day #1.  He is ready for discharge home.  He will resume his regular dose of Eliquis.  He will also resume Plavix for a duration of 1 month.  He will be prescribed 2 to 3 days of tramadol for continued postoperative pain control.  He will follow-up in office in 4 weeks with a carotid duplex.  Discharge instructions were reviewed with the patient and he voiced his understanding.  He will be discharged home this morning in stable condition.   Recent Labs (last 2 labs)      Recent Labs    02/09/21 0542  NA 140  K 4.1  CL 108  CO2 26  GLUCOSE 119*  BUN 23  CALCIUM 8.6*     Recent Labs (last 2 labs)      Recent Labs    02/09/21 0542  WBC 12.5*  HGB 11.2*  HCT 35.9*  PLT 125*     Recent Labs (last 2 labs)   No results for input(s): INR in the last 72 hours.      Discharge Diagnosis:  Carotid stenosis [I65.29]  Secondary Diagnosis: Patient Active Problem List   Diagnosis Date Noted  . Carotid stenosis 02/08/2021  . Status post carotid endarterectomy 09/13/2020  . Carotid artery stenosis, symptomatic, right 09/13/2020  . Tear of lateral meniscus of knee 05/04/2020  . Osteoarthritis of left knee 04/05/2020  . Pain in left knee 04/03/2020  . Pleural  effusion 04/12/2019  . Pneumothorax on left 01/10/2019  . Pneumothorax, left 01/09/2019  . CKD (chronic kidney disease), stage III ([place]) 01/09/2019  . Chronic diastolic CHF (congestive heart failure) ([place]) 01/09/2019  . Hypothyroidism 01/09/2019  . Pleural effusion on left 01/09/2019  . NSTEMI (non-ST elevated myocardial infarction) ([place]) 10/21/2018  . Acute diastolic CHF (congestive heart failure) ([place])   . Non-ST elevation (NSTEMI) myocardial infarction ([place]) 10/17/2018  . CAP (community acquired pneumonia) 03/19/2018  . Hoarseness 12/15/2013  . Mild aortic stenosis 11/03/2012  . Atrial fibrillation ([place]) 04/02/2011  . Carotid stenosis, asymptomatic 04/02/2011  . CAD (coronary artery disease) 01/10/2011  . Abdominal aortic aneurysm ([place]) 11/06/2010  . CHEST TIGHTNESS-PRESSURE-OTHER 11/01/2010  . Asthma, mild intermittent 04/10/2010  . DYSPNEA 02/26/2010  . OBSTRUCTIVE SLEEP APNEA 08/22/2008  . Essential hypertension 08/22/2008  . BARRETTS ESOPHAGUS 08/18/2008  . HIATAL HERNIA 08/18/2008  . DIVERTICULOSIS, COLON 08/18/2008  . COLONIC POLYPS, HX OF 08/18/2008       Past Medical History:  Diagnosis Date  . Abdominal aortic aneurysm (AAA) ([place])   . Achilles tendinitis of right lower extremity   . Arthritis    "my whole body" (03/19/2018)  . Atherosclerosis of coronary artery bypass graft w/o angina pectoris   . Atrial fibrillation ([place])   . Barrett's esophagus   . CAD (coronary artery disease)   . Carotid artery disease ([place])   .  CHF (congestive heart failure) ([place])   . Chronic anticoagulation    PE  . Chronic lower back pain   . Dyspnea   . Essential tremor   . GERD (gastroesophageal reflux disease)   . High cholesterol   . Hyperlipidemia   . Hyperplasia, prostate   . Hypertension   . Hypothyroidism   . Myocardial infarction [facility])    2 in Jan. 2019  . Nail dystrophy   . OSA on CPAP    uses CPAP  . Osteoarthrosis   . Pneumonia     "now and once before" (03/19/2018)  . Pulmonary embolism [facility])    April 2012 after CABG  . S/P CABG (coronary artery bypass graft)   . Sleep apnea   . Spontaneous pneumothorax    left spontaneous pneumothorax/left pleural effusion, s/p chest tube 01/09/19; thoracentesis x2, s/p left VATS/tacl pleurodesis 04/12/19         Allergies as of 02/09/2021      Reactions   Ancef [cefazolin Sodium] Hives   Vancomycin Rash         Medication List    TAKE these medications   acetaminophen 500 MG tablet Commonly known as: TYLENOL Take 1,000 mg by mouth every 6 (six) hours as needed for moderate pain or headache.   Ascorbic Acid 500 MG Caps Take 500 mg by mouth daily.   atorvastatin 80 MG tablet Commonly known as: LIPITOR Take 1 tablet (80 mg total) by mouth daily.   carvedilol 3.125 MG tablet Commonly known as: Coreg Take 1 tablet (3.125 mg total) by mouth 2 (two) times daily.   CENTRUM SILVER PO Take 1 tablet by mouth daily.   cetirizine 10 MG tablet Commonly known as: ZYRTEC Take 10 mg by mouth daily.   cholecalciferol 1000 units tablet Commonly known as: VITAMIN D Take 1,000 Units by mouth every evening.   clopidogrel 75 MG tablet Commonly known as: Plavix Take 1 tablet (75 mg total) by mouth daily.   dapagliflozin propanediol 10 MG Tabs tablet Commonly known as: Farxiga Take 1 tablet (10 mg total) by mouth daily before breakfast.   furosemide 40 MG tablet Commonly known as: LASIX Take 0.5 tablets (20 mg total) by mouth daily.   isosorbide mononitrate 30 MG 24 hr tablet Commonly known as: IMDUR Take 2 tablets (60 mg total) by mouth daily.   levothyroxine 50 MCG tablet Commonly known as: SYNTHROID Take 50 mcg by mouth daily before breakfast.   pantoprazole 40 MG tablet Commonly known as: PROTONIX Take 40 mg by mouth daily.   tamsulosin 0.4 MG Caps capsule Commonly known as: FLOMAX Take 0.4 mg by mouth daily after supper.    traMADol 50 MG tablet Commonly known as: ULTRAM Take 1 tablet (50 mg total) by mouth every 6 (six) hours as needed for moderate pain.        Discharge Instructions:   [facility] Discharge Instructions Carotid Endarterectomy (CEA)  Please refer to the following instructions for your post-procedure care. Your surgeon or physician assistant will discuss any changes with you.  Activity  You are encouraged to walk as much as you can. You can slowly return to normal activities but must avoid strenuous activity and heavy lifting until your doctor tell you it's OK. Avoid activities such as vacuuming or swinging a golf club. You can drive after one week if you are comfortable and you are no longer taking prescription pain medications. It is normal to feel tired for serval  weeks after your surgery. It is also normal to have difficulty with sleep habits, eating, and bowel movements after surgery. These will go away with time.  Bathing/Showering  You may shower after you come home. Do not soak in a bathtub, hot tub, or swim until the incision heals completely.  Incision Care  Shower every day. Clean your incision with mild soap and water. Pat the area dry with a clean towel. You do not need a bandage unless otherwise instructed. Do not apply any ointments or creams to your incision. You may have skin glue on your incision. Do not peel it off. It will come off on its own in about one week. Your incision may feel thickened and raised for several weeks after your surgery. This is normal and the skin will soften over time. For Men Only: It's OK to shave around the incision but do not shave the incision itself for 2 weeks. It is common to have numbness under your chin that could last for several months.  Diet  Resume your normal diet. There are no special food restrictions following this procedure. A low fat/low cholesterol diet is recommended for all  patients with vascular disease. In order to heal from your surgery, it is CRITICAL to get adequate nutrition. Your body requires vitamins, minerals, and protein. Vegetables are the best source of vitamins and minerals. Vegetables also provide the perfect balance of protein. Processed food has little nutritional value, so try to avoid this.  Medications  Resume taking all of your medications unless your doctor or physician assistant tells you not to.  If your incision is causing pain, you may take over-the- counter pain relievers such as acetaminophen (Tylenol). If you were prescribed a stronger pain medication, please be aware these medications can cause nausea and constipation.  Prevent nausea by taking the medication with a snack or meal. Avoid constipation by drinking plenty of fluids and eating foods with a high amount of fiber, such as fruits, vegetables, and grains. Do not take Tylenol if you are taking prescription pain medications.  Follow Up  Our office will schedule a follow up appointment 2-3 weeks following discharge.  Please call us immediately for any of the following conditions   Increased pain, redness, drainage (pus) from your incision site.  Fever of 101 degrees or higher.  If you should develop stroke (slurred speech, difficulty swallowing, weakness on one side of your body, loss of vision) you should call 911 and go to the nearest emergency room.   Reduce your risk of vascular disease:   Stop smoking. If you would like help call [facility] at 1-800-QUIT-NOW 619-013-8749) or [place] at [phone].  Manage your cholesterol  Maintain a desired weight  Control your diabetes  Keep your blood pressure down   If you have any questions, please call the office at [phone].  Disposition: home  Patient's condition: is Good  Follow up: 1. Dr. Trula Slade in 2 weeks.   Dagoberto Ligas, PA-C [facility] [phone]   --- For [facility] Registry use ---   Modified Rankin score at D/C (0-6): 0  IV medication needed for:  1. Hypertension: No 2. Hypotension: No  Post-op Complications: No  1. Post-op CVA or TIA: No  If yes: Event classification (right eye, left eye, right cortical, left cortical, verterobasilar, other):   If yes: Timing of event (intra-op, <6 hrs post-op, >=6 hrs post-op, unknown):   2. CN injury: No  If yes: CN  injuried  3. Myocardial infarction: No  If yes: Dx by (EKG or clinical, Troponin):   4.  CHF: No  5.  Dysrhythmia (new): No  6. Wound infection: No  7. Reperfusion symptoms: No  8. Return to OR: No  If yes: return to OR for (bleeding, neurologic, other CEA incision, other):   Discharge medications: Statin use:  Yes ASA use:  No   Beta blocker use:  Yes ACE-Inhibitor use:  No           ARB use:  No CCB use: No P2Y12 Antagonist use: Yes, [ x] Plavix, [ ]  Plasugrel, [ ]  Ticlopinine, [ ]  Ticagrelor, [ ]  Other, [ ]  No for medical reason, [ ]  Non-compliant, [ ]  Not-indicated Anti-coagulant use:  No, [ ]  Warfarin, [ ]  Rivaroxaban, [ ]  Dabigatran,              Note Details  Orlean Patten File Time 02/09/2021 8:39 AM  Author Type Physician Assistant Certified Status Signed  Last Editor Iline Oven [facility] [place] # [id number] Admit Date 02/08/2021

## 2021-02-11 ENCOUNTER — Encounter (HOSPITAL_COMMUNITY): Payer: Self-pay | Admitting: Surgery

## 2021-02-11 LAB — POCT ACTIVATED CLOTTING TIME: Activated Clotting Time: 220 seconds

## 2021-02-12 ENCOUNTER — Telehealth: Payer: Self-pay

## 2021-02-12 DIAGNOSIS — I350 Nonrheumatic aortic (valve) stenosis: Secondary | ICD-10-CM | POA: Diagnosis not present

## 2021-02-12 DIAGNOSIS — K449 Diaphragmatic hernia without obstruction or gangrene: Secondary | ICD-10-CM | POA: Diagnosis not present

## 2021-02-12 DIAGNOSIS — Z48812 Encounter for surgical aftercare following surgery on the circulatory system: Secondary | ICD-10-CM | POA: Diagnosis not present

## 2021-02-12 DIAGNOSIS — K5730 Diverticulosis of large intestine without perforation or abscess without bleeding: Secondary | ICD-10-CM | POA: Diagnosis not present

## 2021-02-12 DIAGNOSIS — J4520 Mild intermittent asthma, uncomplicated: Secondary | ICD-10-CM | POA: Diagnosis not present

## 2021-02-12 DIAGNOSIS — M1712 Unilateral primary osteoarthritis, left knee: Secondary | ICD-10-CM | POA: Diagnosis not present

## 2021-02-12 DIAGNOSIS — I4891 Unspecified atrial fibrillation: Secondary | ICD-10-CM | POA: Diagnosis not present

## 2021-02-12 DIAGNOSIS — G8929 Other chronic pain: Secondary | ICD-10-CM | POA: Diagnosis not present

## 2021-02-12 DIAGNOSIS — K2270 Barrett's esophagus without dysplasia: Secondary | ICD-10-CM | POA: Diagnosis not present

## 2021-02-12 DIAGNOSIS — I130 Hypertensive heart and chronic kidney disease with heart failure and stage 1 through stage 4 chronic kidney disease, or unspecified chronic kidney disease: Secondary | ICD-10-CM | POA: Diagnosis not present

## 2021-02-12 DIAGNOSIS — R531 Weakness: Secondary | ICD-10-CM | POA: Diagnosis not present

## 2021-02-12 DIAGNOSIS — E7800 Pure hypercholesterolemia, unspecified: Secondary | ICD-10-CM | POA: Diagnosis not present

## 2021-02-12 DIAGNOSIS — I2510 Atherosclerotic heart disease of native coronary artery without angina pectoris: Secondary | ICD-10-CM | POA: Diagnosis not present

## 2021-02-12 DIAGNOSIS — Z95828 Presence of other vascular implants and grafts: Secondary | ICD-10-CM | POA: Diagnosis not present

## 2021-02-12 DIAGNOSIS — N183 Chronic kidney disease, stage 3 unspecified: Secondary | ICD-10-CM | POA: Diagnosis not present

## 2021-02-12 DIAGNOSIS — I5030 Unspecified diastolic (congestive) heart failure: Secondary | ICD-10-CM | POA: Diagnosis not present

## 2021-02-12 NOTE — Telephone Encounter (Signed)
Pt's home health nurse Rhona Leavens with [facility]) called to review pt's medications per discharge note. No further questions/concerns at this time.

## 2021-02-14 ENCOUNTER — Telehealth: Payer: Self-pay

## 2021-02-14 ENCOUNTER — Other Ambulatory Visit: Payer: Self-pay | Admitting: *Deleted

## 2021-02-14 DIAGNOSIS — I6523 Occlusion and stenosis of bilateral carotid arteries: Secondary | ICD-10-CM

## 2021-02-14 DIAGNOSIS — Z95828 Presence of other vascular implants and grafts: Secondary | ICD-10-CM | POA: Diagnosis not present

## 2021-02-14 DIAGNOSIS — Z48812 Encounter for surgical aftercare following surgery on the circulatory system: Secondary | ICD-10-CM | POA: Diagnosis not present

## 2021-02-14 DIAGNOSIS — N183 Chronic kidney disease, stage 3 unspecified: Secondary | ICD-10-CM | POA: Diagnosis not present

## 2021-02-14 DIAGNOSIS — I5030 Unspecified diastolic (congestive) heart failure: Secondary | ICD-10-CM | POA: Diagnosis not present

## 2021-02-14 DIAGNOSIS — M1712 Unilateral primary osteoarthritis, left knee: Secondary | ICD-10-CM | POA: Diagnosis not present

## 2021-02-14 DIAGNOSIS — I130 Hypertensive heart and chronic kidney disease with heart failure and stage 1 through stage 4 chronic kidney disease, or unspecified chronic kidney disease: Secondary | ICD-10-CM | POA: Diagnosis not present

## 2021-02-14 NOTE — Telephone Encounter (Signed)
HH RN Lauren called to report patient would like to skip his [address] [facility] visit tomorrow. She says patient is doing well with no issues. Advised this was okay, they will resume visits next week. PT will also perform assessment.

## 2021-02-19 DIAGNOSIS — Z48812 Encounter for surgical aftercare following surgery on the circulatory system: Secondary | ICD-10-CM | POA: Diagnosis not present

## 2021-02-19 DIAGNOSIS — M1712 Unilateral primary osteoarthritis, left knee: Secondary | ICD-10-CM | POA: Diagnosis not present

## 2021-02-19 DIAGNOSIS — N183 Chronic kidney disease, stage 3 unspecified: Secondary | ICD-10-CM | POA: Diagnosis not present

## 2021-02-19 DIAGNOSIS — I5030 Unspecified diastolic (congestive) heart failure: Secondary | ICD-10-CM | POA: Diagnosis not present

## 2021-02-19 DIAGNOSIS — I130 Hypertensive heart and chronic kidney disease with heart failure and stage 1 through stage 4 chronic kidney disease, or unspecified chronic kidney disease: Secondary | ICD-10-CM | POA: Diagnosis not present

## 2021-02-19 DIAGNOSIS — Z95828 Presence of other vascular implants and grafts: Secondary | ICD-10-CM | POA: Diagnosis not present

## 2021-02-26 ENCOUNTER — Other Ambulatory Visit (HOSPITAL_COMMUNITY): Payer: Self-pay | Admitting: Cardiology

## 2021-02-26 ENCOUNTER — Ambulatory Visit (HOSPITAL_COMMUNITY)
Admission: RE | Admit: 2021-02-26 | Discharge: 2021-02-26 | Disposition: A | Discharge status: Home / Self Care | Payer: Medicare Other | Source: Ambulatory Visit | Attending: Cardiology | Admitting: Cardiology

## 2021-02-26 ENCOUNTER — Other Ambulatory Visit (HOSPITAL_COMMUNITY): Payer: Self-pay | Admitting: *Deleted

## 2021-02-26 ENCOUNTER — Other Ambulatory Visit: Payer: Self-pay

## 2021-02-26 DIAGNOSIS — I10 Essential (primary) hypertension: Secondary | ICD-10-CM

## 2021-02-26 LAB — BASIC METABOLIC PANEL
Anion gap: 4 — ABNORMAL LOW (ref 5–15)
BUN: 17 mg/dL (ref 8–23)
CO2: 29 mmol/L (ref 22–32)
Calcium: 9.3 mg/dL (ref 8.9–10.3)
Chloride: 106 mmol/L (ref 98–111)
Creatinine, Ser: 1.16 mg/dL (ref 0.61–1.24)
GFR, Estimated: >60 mL/min (ref >60)
Glucose, Bld: 97 mg/dL (ref 70–99)
Potassium: 4.3 mmol/L (ref 3.5–5.1)
Sodium: 139 mmol/L (ref 135–145)

## 2021-02-26 LAB — LIPID PANEL
Cholesterol: 112 mg/dL (ref 0–200)
HDL: 39 mg/dL — ABNORMAL LOW (ref >40)
LDL Cholesterol: 49 mg/dL (ref 0–99)
Total CHOL/HDL Ratio: 2.9 RATIO
Triglycerides: 118 mg/dL (ref <150)
VLDL: 24 mg/dL (ref 0–40)

## 2021-02-26 MED ORDER — ISOSORBIDE MONONITRATE ER 30 MG PO TB24: 30.0000 mg | ORAL_TABLET | Freq: Every day | ORAL | Status: DC

## 2021-03-11 ENCOUNTER — Other Ambulatory Visit: Payer: Self-pay

## 2021-03-11 ENCOUNTER — Ambulatory Visit (HOSPITAL_COMMUNITY)
Admission: RE | Admit: 2021-03-11 | Discharge: 2021-03-11 | Disposition: A | Discharge status: Home / Self Care | Payer: Medicare Other | Source: Ambulatory Visit | Attending: Surgery | Admitting: Surgery

## 2021-03-11 ENCOUNTER — Ambulatory Visit (INDEPENDENT_AMBULATORY_CARE_PROVIDER_SITE_OTHER): Payer: Medicare Other | Admitting: Physician Assistant

## 2021-03-11 VITALS — BP 119/67 | HR 61 | Temp 97.3°F | Resp 18 | Ht 72.0 in | Wt 205.0 lb

## 2021-03-11 DIAGNOSIS — I6523 Occlusion and stenosis of bilateral carotid arteries: Secondary | ICD-10-CM | POA: Diagnosis not present

## 2021-03-11 NOTE — Progress Notes (Signed)
    Postoperative Visit   History of Present Illness   Eric Gordon is a 87 y.o. male who presents for postoperative follow-up for: left TCAR (Date: 02/08/21) by Dr. Trula Slade.  This was performed due to high-grade asymptomatic stenosis.  He also had his right TCAR performed Dr. Trula Slade on 09/13/2020 and has healed well without any neurological events.  The patient's neck incision is healed.  The patient has not stroke or TIA symptoms.  He is on plavix and statin daily.  Current Outpatient Medications  Medication Sig Dispense Refill   acetaminophen (TYLENOL) 500 MG tablet Take 1,000 mg by mouth every 6 (six) hours as needed for moderate pain or headache.     Ascorbic Acid 500 MG CAPS Take 500 mg by mouth daily.     atorvastatin (LIPITOR) 80 MG tablet Take 1 tablet (80 mg total) by mouth daily. 30 tablet 6   cetirizine (ZYRTEC) 10 MG tablet Take 10 mg by mouth daily.     cholecalciferol (VITAMIN D) 1000 UNITS tablet Take 1,000 Units by mouth every evening.     clopidogrel (PLAVIX) 75 MG tablet Take 1 tablet (75 mg total) by mouth daily. 30 tablet 11   dapagliflozin propanediol (FARXIGA) 10 MG TABS tablet Take 1 tablet (10 mg total) by mouth daily before breakfast. 30 tablet 6   furosemide (LASIX) 40 MG tablet Take 0.5 tablets (20 mg total) by mouth daily. 30 tablet    isosorbide mononitrate (IMDUR) 30 MG 24 hr tablet Take 1 tablet (30 mg total) by mouth daily.     levothyroxine (SYNTHROID, LEVOTHROID) 50 MCG tablet Take 50 mcg by mouth daily before breakfast.     Multiple Vitamins-Minerals (CENTRUM SILVER PO) Take 1 tablet by mouth daily.      pantoprazole (PROTONIX) 40 MG tablet Take 40 mg by mouth daily.      tamsulosin (FLOMAX) 0.4 MG CAPS capsule Take 0.4 mg by mouth daily after supper.      traMADol (ULTRAM) 50 MG tablet Take 1 tablet (50 mg total) by mouth every 6 (six) hours as needed for moderate pain. 20 tablet 0   carvedilol (COREG) 3.125 MG tablet Take 1 tablet (3.125 mg total)  by mouth 2 (two) times daily. 60 tablet 11   No current facility-administered medications for this visit.    For [facility] Use Only   PRE-ADM LIVING: Home  AMB STATUS: Ambulatory   Physical Examination   Vitals:   03/11/21 1331 03/11/21 1336  BP: 126/72 119/67  Pulse: 61 61  Resp: 18   Temp: (!) 97.3 F (36.3 C)   SpO2: 98%   Weight: 205 lb (93 kg)   Height: 6' (1.829 m)     bilateral Neck: Incisions are healed  Neuro: CN 2-12 are intact; Motor strength is 5/5 bilaterally, sensation is grossly intact   Medical Decision Making   Eric Gordon is a 87 y.o. male who presents s/p staged bilateral TCAR  Left neck incision is well-healed Carotid duplex demonstrates widely patent carotid artery stents bilaterally No significant neurological events postoperatively Continue Plavix Recheck carotid duplex in 9 months per protocol  Dagoberto Ligas PA-C [facility] [place] Office: [phone]  Clinic MD: Dr. Trula Slade was involved in the evaluation and management of this patient today

## 2021-03-12 ENCOUNTER — Other Ambulatory Visit: Payer: Self-pay

## 2021-03-12 DIAGNOSIS — I6523 Occlusion and stenosis of bilateral carotid arteries: Secondary | ICD-10-CM

## 2021-03-27 IMAGING — DX PORTABLE CHEST - 1 VIEW
1 series · 1 of 1 positions shown · non-contrast
Comparison: Chest CT and radiographs 10/24/2018 and earlier.
COMPARISON: Chest CT and radiographs 10/24/2018 and earlier.

Addendum:
CLINICAL DATA: 87-year-old male with left chest pain.

EXAM:
PORTABLE CHEST 1 VIEW

[chest ap]
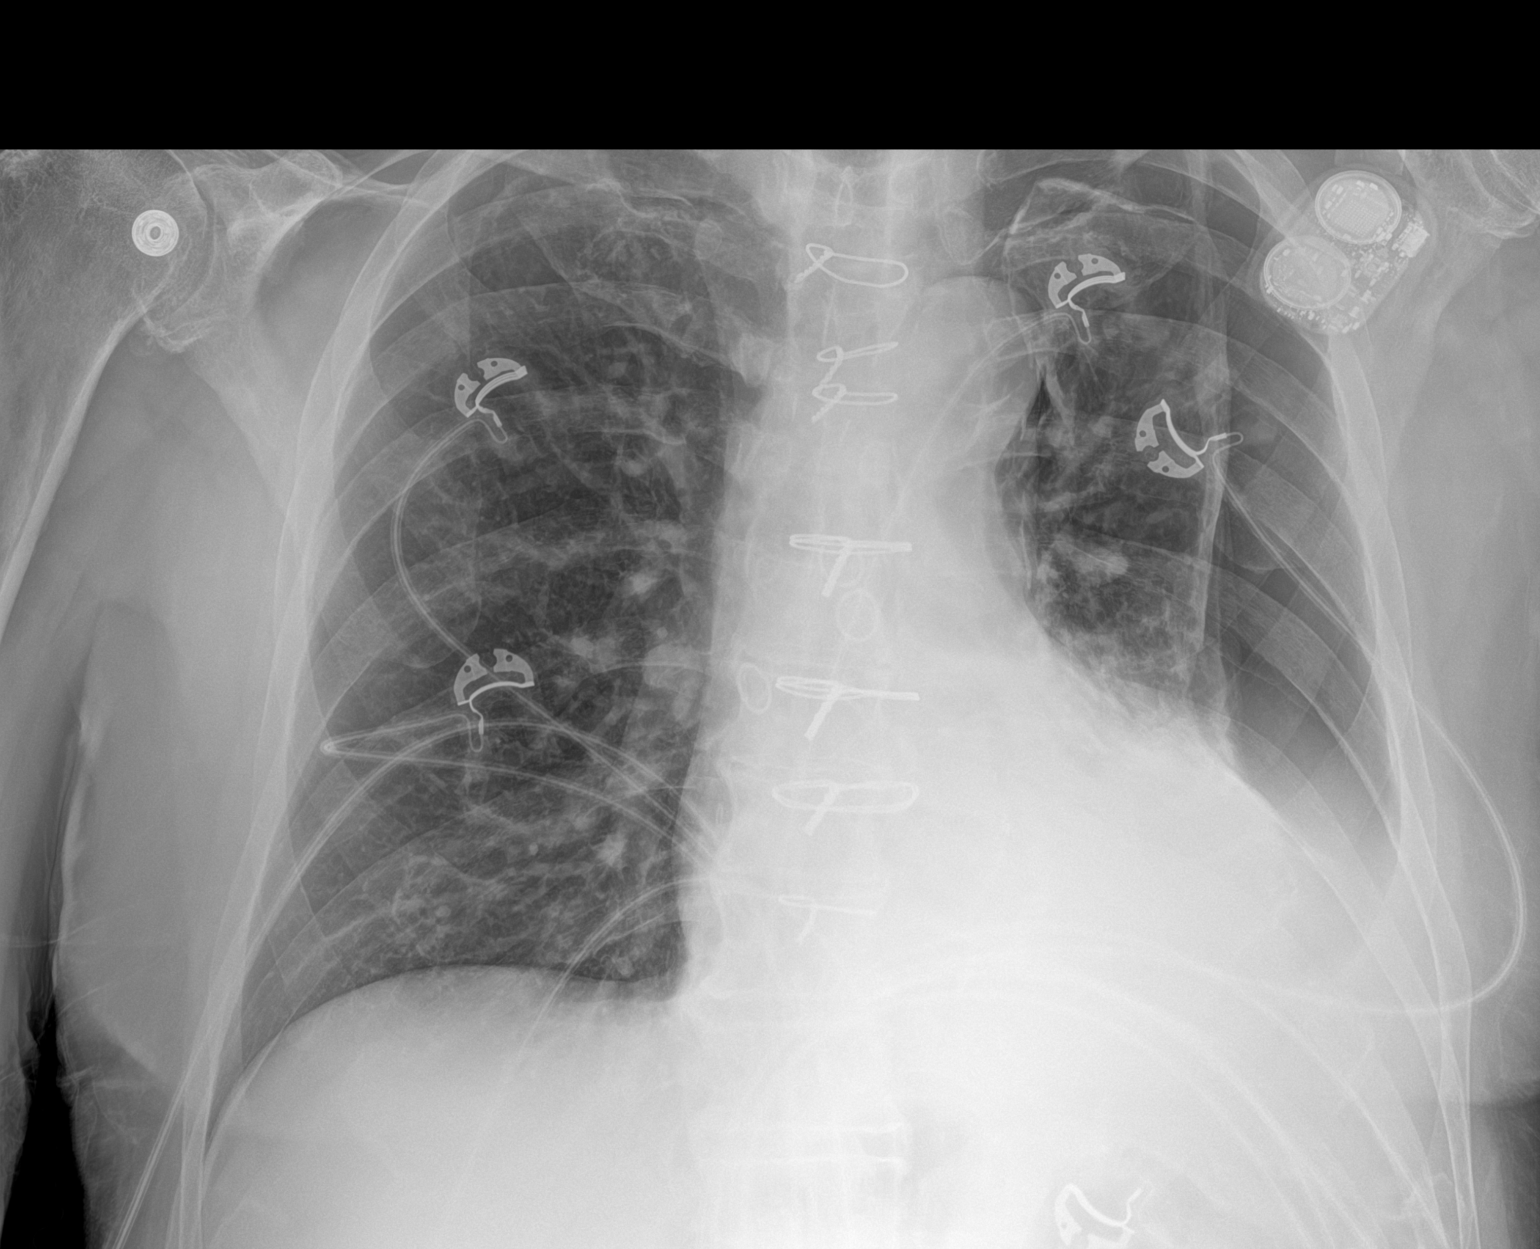

[1 of 1 positions shown; findings below may reference images not displayed]

FINDINGS: Portable AP upright view at 1491 hours.

Moderate to large left pneumothorax with superimposed lung base
pleural effusion and/or consolidation. Other atelectatic lung is
gathered in the medial left hemithorax.

Stable cardiac size and mediastinal contours. Prior CABG. Visualized
tracheal air column is within normal limits. No midline shift.

No left rib fracture is evident. The right lung appears stable in
clear. Negative visible bowel gas pattern.
IMPRESSION: 1. Moderate to large left pneumothorax but no mediastinal shift to
suggest a component of tension.
2. Superimposed left pleural effusion and/or lung base
consolidation.
3. No left rib fracture or other acute cardiopulmonary abnormality
identified.

ADDENDUM:
Critical Value/emergent results were called by telephone at the time
of interpretation on 01/09/2019 at 0540 hours to Dr. Caleb Aujla who
verbally acknowledged these results.

*** End of Addendum ***
FINDINGS: Portable AP upright view at 1491 hours.

Moderate to large left pneumothorax with superimposed lung base
pleural effusion and/or consolidation. Other atelectatic lung is
gathered in the medial left hemithorax.

Stable cardiac size and mediastinal contours. Prior CABG. Visualized
tracheal air column is within normal limits. No midline shift.

No left rib fracture is evident. The right lung appears stable in
clear. Negative visible bowel gas pattern.
IMPRESSION: 1. Moderate to large left pneumothorax but no mediastinal shift to
suggest a component of tension.
2. Superimposed left pleural effusion and/or lung base
consolidation.
3. No left rib fracture or other acute cardiopulmonary abnormality
identified.

## 2021-03-27 IMAGING — DX PORTABLE CHEST - 1 VIEW
1 series · 1 of 1 positions shown · non-contrast
Comparison: 01/09/2019 prior radiographs

CLINICAL DATA: Follow-up pneumothorax following LEFT thoracostomy
tube placement.

EXAM:
PORTABLE CHEST 1 VIEW

[chest ap]
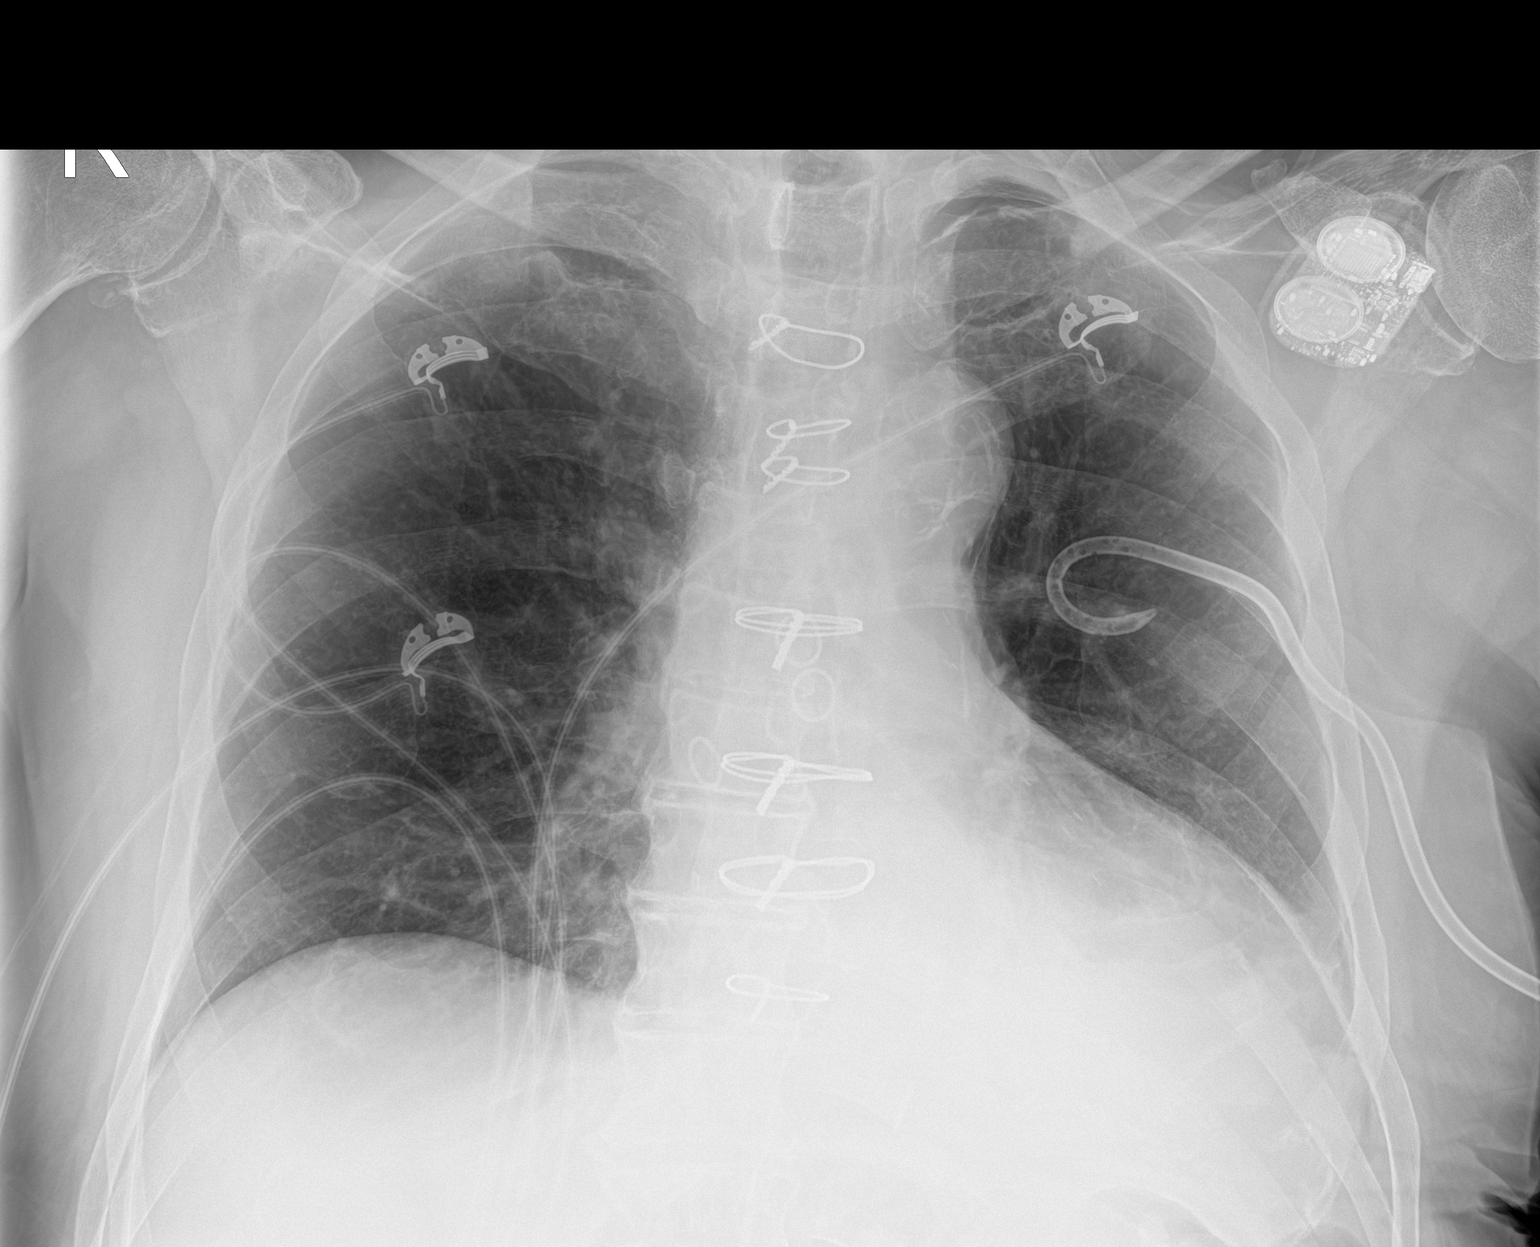

[1 of 1 positions shown; findings below may reference images not displayed]

FINDINGS: A LEFT pigtail thoracostomy tube is noted. A tiny residual LEFT
apical/lateral pneumothorax (less than 5%).

LEFT basilar consolidation/atelectasis is noted.

Cardiomegaly and CABG changes are noted.

No acute bony abnormality noted.
IMPRESSION: LEFT thoracostomy tube placement with tiny residual LEFT
pneumothorax.

Continued LEFT basilar consolidation/atelectasis.

## 2021-03-29 DIAGNOSIS — Z20822 Contact with and (suspected) exposure to covid-19: Secondary | ICD-10-CM | POA: Diagnosis not present

## 2021-03-29 IMAGING — DX PORTABLE CHEST - 1 VIEW
1 series · 1 of 1 positions shown · non-contrast
Comparison: Radiograph January 10, 2019.

CLINICAL DATA: Pneumothorax.

EXAM:
PORTABLE CHEST 1 VIEW

[chest ap]
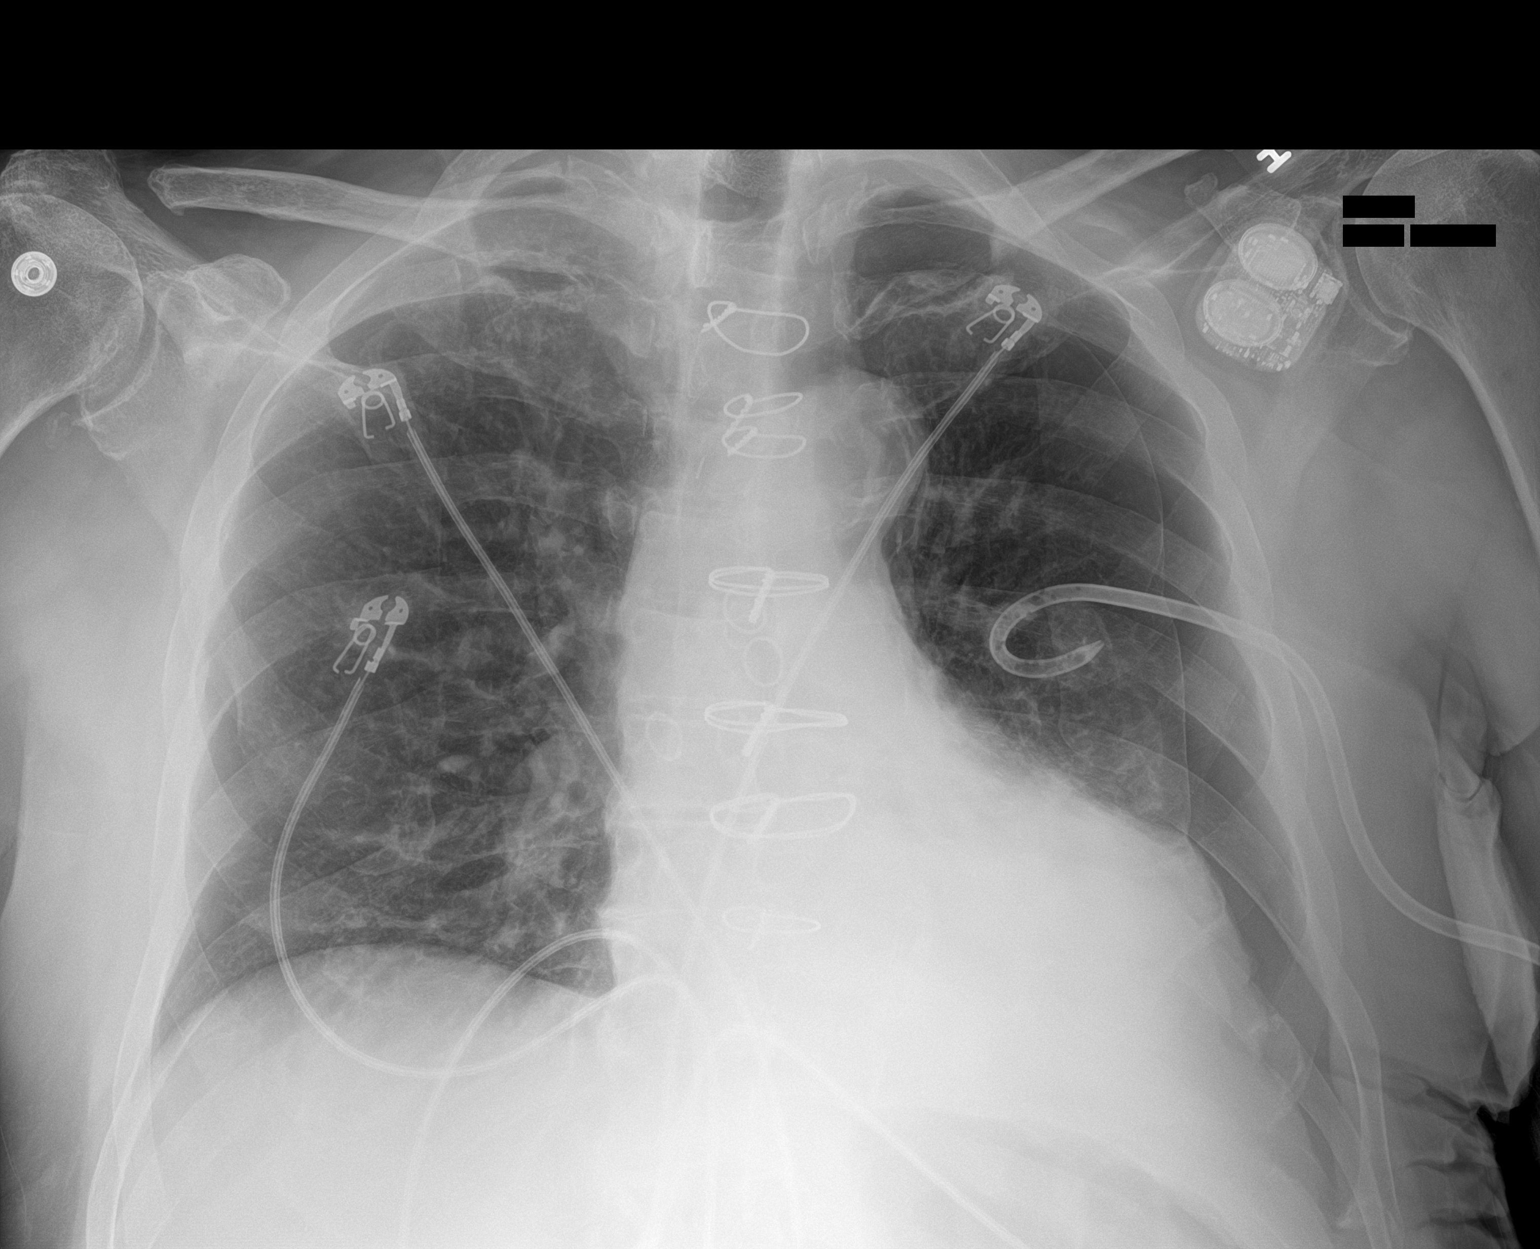

[1 of 1 positions shown; findings below may reference images not displayed]

FINDINGS: Stable cardiomediastinal silhouette. Atherosclerosis of thoracic
aorta is noted. Status post coronary bypass graft. Right lung is
clear. Stable position of left-sided chest tube. Moderate size left
pneumothorax is noted which is significantly increased in size
compared to prior exam. Bony thorax is unremarkable.
IMPRESSION: Left-sided pneumothorax is significantly enlarged compared to prior
exam. Left-sided chest tube is unchanged in position.

Aortic Atherosclerosis ([id number]).

## 2021-04-01 IMAGING — CR PORTABLE CHEST - 1 VIEW
1 series · 1 of 1 positions shown · non-contrast
Comparison: 01/14/2019 and prior radiographs

CLINICAL DATA: Shortness of breath

EXAM:
PORTABLE CHEST 1 VIEW

[AP]
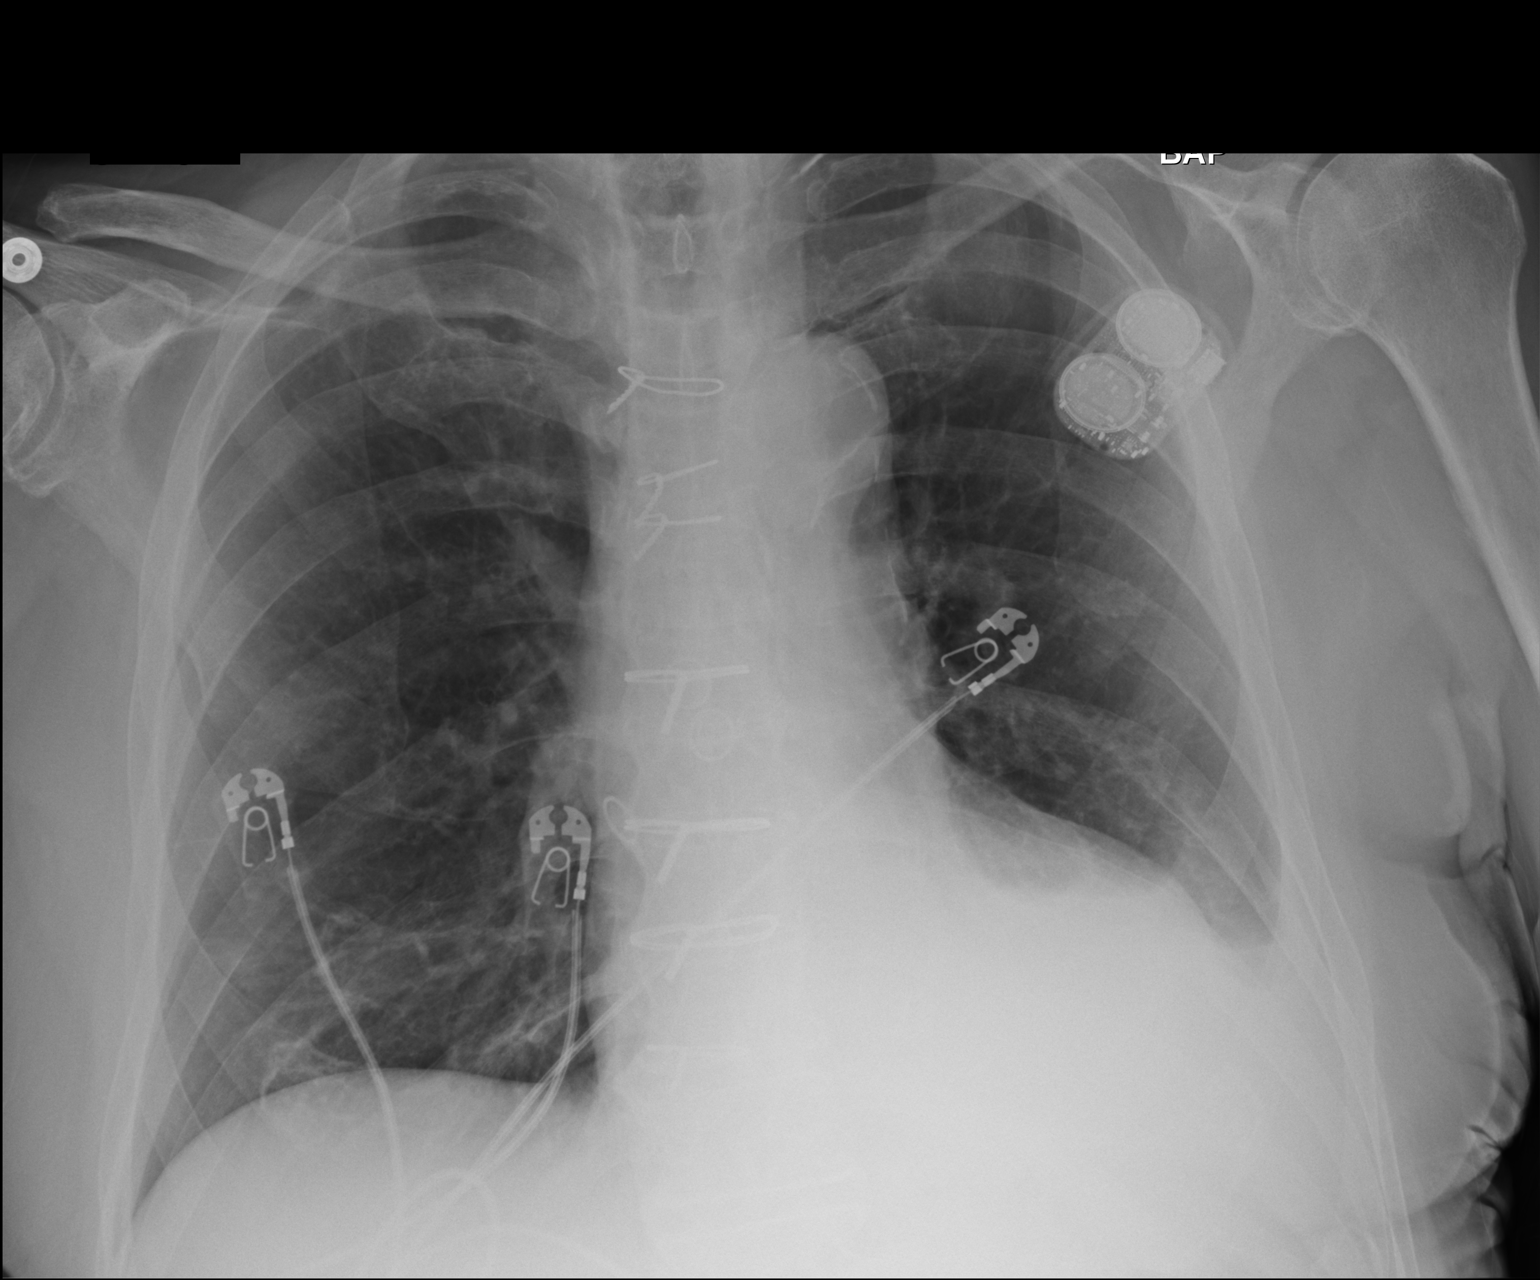

[1 of 1 positions shown; findings below may reference images not displayed]

FINDINGS: Cardiomediastinal silhouette is unchanged. CABG changes again noted.

A LEFT thoracostomy tube has been removed. A tiny LEFT apical
pneumothorax is unchanged.

LEFT LOWER lung consolidation/atelectasis and small LEFT effusion
again noted.

The RIGHT lung is clear.
IMPRESSION: LEFT thoracostomy tube removal with unchanged tiny LEFT apical
pneumothorax.

Continued LEFT LOWER lung consolidation/atelectasis and small LEFT
effusion

## 2021-04-04 ENCOUNTER — Telehealth (HOSPITAL_COMMUNITY): Payer: Self-pay | Admitting: *Deleted

## 2021-04-04 NOTE — Telephone Encounter (Signed)
Pt left VM stating since starting farxiga he has been dizzy. Experiencing heart burn and other side effects.  Pt no longer wants to take medication. Pt has not taken his bp.  Routed to [place]

## 2021-04-04 NOTE — Telephone Encounter (Signed)
Pts wife aware and agreeable with plan.  

## 2021-04-04 NOTE — Telephone Encounter (Signed)
He can stop [place]

## 2021-05-07 ENCOUNTER — Other Ambulatory Visit: Payer: Self-pay

## 2021-05-07 ENCOUNTER — Ambulatory Visit (HOSPITAL_COMMUNITY)
Admission: RE | Admit: 2021-05-07 | Discharge: 2021-05-07 | Disposition: A | Discharge status: Home / Self Care | Payer: Medicare Other | Source: Ambulatory Visit | Attending: Cardiology | Admitting: Cardiology

## 2021-05-07 VITALS — BP 134/72 | HR 60 | Wt 212.4 lb

## 2021-05-07 DIAGNOSIS — I2510 Atherosclerotic heart disease of native coronary artery without angina pectoris: Secondary | ICD-10-CM | POA: Insufficient documentation

## 2021-05-07 DIAGNOSIS — Z7902 Long term (current) use of antithrombotics/antiplatelets: Secondary | ICD-10-CM | POA: Diagnosis not present

## 2021-05-07 DIAGNOSIS — I350 Nonrheumatic aortic (valve) stenosis: Secondary | ICD-10-CM | POA: Insufficient documentation

## 2021-05-07 DIAGNOSIS — I110 Hypertensive heart disease with heart failure: Secondary | ICD-10-CM | POA: Insufficient documentation

## 2021-05-07 DIAGNOSIS — Z951 Presence of aortocoronary bypass graft: Secondary | ICD-10-CM | POA: Insufficient documentation

## 2021-05-07 DIAGNOSIS — I252 Old myocardial infarction: Secondary | ICD-10-CM | POA: Diagnosis not present

## 2021-05-07 DIAGNOSIS — J984 Other disorders of lung: Secondary | ICD-10-CM | POA: Diagnosis not present

## 2021-05-07 DIAGNOSIS — Z8616 Personal history of COVID-19: Secondary | ICD-10-CM | POA: Diagnosis not present

## 2021-05-07 DIAGNOSIS — Z8249 Family history of ischemic heart disease and other diseases of the circulatory system: Secondary | ICD-10-CM | POA: Diagnosis not present

## 2021-05-07 DIAGNOSIS — Z79899 Other long term (current) drug therapy: Secondary | ICD-10-CM | POA: Insufficient documentation

## 2021-05-07 DIAGNOSIS — I714 Abdominal aortic aneurysm, without rupture: Secondary | ICD-10-CM | POA: Insufficient documentation

## 2021-05-07 DIAGNOSIS — R0602 Shortness of breath: Secondary | ICD-10-CM | POA: Diagnosis not present

## 2021-05-07 DIAGNOSIS — I5032 Chronic diastolic (congestive) heart failure: Secondary | ICD-10-CM | POA: Insufficient documentation

## 2021-05-07 DIAGNOSIS — Z09 Encounter for follow-up examination after completed treatment for conditions other than malignant neoplasm: Secondary | ICD-10-CM | POA: Insufficient documentation

## 2021-05-07 DIAGNOSIS — Z87891 Personal history of nicotine dependence: Secondary | ICD-10-CM | POA: Diagnosis not present

## 2021-05-07 DIAGNOSIS — Z7901 Long term (current) use of anticoagulants: Secondary | ICD-10-CM | POA: Diagnosis not present

## 2021-05-07 DIAGNOSIS — J90 Pleural effusion, not elsewhere classified: Secondary | ICD-10-CM | POA: Insufficient documentation

## 2021-05-07 DIAGNOSIS — E785 Hyperlipidemia, unspecified: Secondary | ICD-10-CM | POA: Diagnosis not present

## 2021-05-07 DIAGNOSIS — M25562 Pain in left knee: Secondary | ICD-10-CM | POA: Insufficient documentation

## 2021-05-07 DIAGNOSIS — I480 Paroxysmal atrial fibrillation: Secondary | ICD-10-CM | POA: Insufficient documentation

## 2021-05-07 LAB — CBC
HCT: 43.7 % (ref 39.0–52.0)
Hemoglobin: 13.5 g/dL (ref 13.0–17.0)
MCH: 30.4 pg (ref 26.0–34.0)
MCHC: 30.9 g/dL (ref 30.0–36.0)
MCV: 98.4 fL (ref 80.0–100.0)
Platelets: 164 10*3/uL (ref 150–400)
RBC: 4.44 MIL/uL (ref 4.22–5.81)
RDW: 14.5 % (ref 11.5–15.5)
WBC: 6.9 10*3/uL (ref 4.0–10.5)
nRBC: 0 % (ref 0.0–0.2)

## 2021-05-07 LAB — BASIC METABOLIC PANEL
Anion gap: 8 (ref 5–15)
BUN: 17 mg/dL (ref 8–23)
CO2: 29 mmol/L (ref 22–32)
Calcium: 9.4 mg/dL (ref 8.9–10.3)
Chloride: 103 mmol/L (ref 98–111)
Creatinine, Ser: 1.1 mg/dL (ref 0.61–1.24)
GFR, Estimated: >60 mL/min (ref >60)
Glucose, Bld: 94 mg/dL (ref 70–99)
Potassium: 4 mmol/L (ref 3.5–5.1)
Sodium: 140 mmol/L (ref 135–145)

## 2021-05-07 LAB — BRAIN NATRIURETIC PEPTIDE: B Natriuretic Peptide: 443.3 pg/mL — ABNORMAL HIGH (ref 0.0–100.0)

## 2021-05-07 MED ORDER — FUROSEMIDE 40 MG PO TABS
20.0000 mg | ORAL_TABLET | ORAL | 3 refills | Status: DC
Start: 2021-05-07 — End: 2022-01-16

## 2021-05-07 MED ORDER — ENTRESTO 24-26 MG PO TABS: 1.0000 | ORAL_TABLET | Freq: Two times a day (BID) | ORAL | 11 refills | Status: DC

## 2021-05-07 NOTE — Patient Instructions (Addendum)
Labs done today. We will contact you only if your labs are abnormal.  START Entresto 24-'26mg'$  (1 tablet) by mouth 2 times daily.   DECREASE Lasix to '20mg'$  (1 tablet) by mouth every other day.  No other medication changes were made. Please continue all current medications as prescribed.  Your physician has requested that you have an abdominal aorta duplex. During this test, an ultrasound is used to evaluate the aorta. Allow 30 minutes for this exam. Do not eat after midnight the day before and avoid carbonated beverages   Your physician recommends that you schedule a follow-up appointment in: 10 days for a lab only appointment and in 3 months with Dr. Aundra Dubin.  If you have any questions or concerns before your next appointment please send [place] a message through [facility] Alanis or call our office at [phone].    TO LEAVE A MESSAGE FOR THE NURSE SELECT OPTION 2, PLEASE LEAVE A MESSAGE INCLUDING: YOUR NAME DATE OF BIRTH CALL BACK NUMBER REASON FOR CALL**this is important as we prioritize the call backs  YOU WILL RECEIVE A CALL BACK THE SAME DAY AS LONG AS YOU CALL BEFORE 4:00 PM   Do the following things EVERYDAY: Weigh yourself in the morning before breakfast. Write it down and keep it in a log. Take your medicines as prescribed Eat low salt foods--Limit salt (sodium) to 2000 mg per day.  Stay as active as you can everyday Limit all fluids for the day to less than 2 liters   At the [facility], you and your health needs are our priority. As part of our continuing mission to provide you with exceptional heart care, we have created designated Provider Care Teams. These Care Teams include your primary Cardiologist (physician) and Advanced Practice Providers (APPs- Physician Assistants and Nurse Practitioners) who all work together to provide you with the care you need, when you need it.   You may see any of the following providers on your designated Care Team at your next follow  up: Dr Glori Bickers Dr Haynes Kerns, NP Lyda Jester, [place] Audry Riles, PharmD   Please be sure to bring in all your medications bottles to every appointment.

## 2021-05-07 NOTE — Progress Notes (Signed)
HDate:  05/07/2021   ID:  [name], DOB 1930-12-30, MRN [id number]   Provider location: [place] [facility] Type of Visit: Established patient  PCP:  Shon Baton, MD  Cardiologist:  Dr. Aundra Dubin   History of Present Illness: Eric Gordon is a 87 y.o. male with history of CAD s/p CABG.  He has had trouble long-term with exertional dyspnea.  Dyspnea triggered evaluation in 2012 leading to CABG but was not resolved by CABG.  PFTs in 3/14 showed only mild obstructive airways disease and V/Q scan showed no PE.  Echo in 9/14 showed normal LV systolic function with moderately dilated RV. [place] in 2/15 showed normal right and left heart filling pressures and normal PA pressure.  Finally, he had a CPX in 3/15 that showed normal capacity compared to age-matched sedentary norms.  He was noted to have chronotropic incompetence.  At a prior appointment, I took him off metoprolol given chronotropic incompetence noted on CPX.  Dyspnea improved significantly with weight loss.  He had Cardiolite in 8/18 with EF 52%, no ischemia or infarction.    He was admitted in 1/20 with NSTEMI.  LHC showed new occlusion of the branch of SVG-ramus and OM that touched down on OM.  There were also serial 70% stenoses in the mid/distal RCA.  There was not thought to be an interventional option and patient was treated medically. He was discharged home but presented a couple days later with recurrent chest pain and dyspnea.  Cath was repeated showing no change from prior.  This admission, he had FFR of the RCA which did not suggest hemodynamic significance.  Echo showed EF 55-60%, normal RV.  CTA did not show a PE. He was noted to be volume overloaded and was diuresed.  He was also noted to be in atrial fibrillation with RVR transiently.  ASA/Plavix was stopped and Eliquis was started. He was discharged to SNF.  Atrial fibrillation recurred and he was started on amiodarone with conversion back to NSR.    In 4/20, he was admitted with left spontaneous PTX requiring chest tube.  After this, he developed recurrent left pleural effusions requiring thoracentesis x 3 so far.  Pleural fluid was exudative with eosinophil predominance.  With elevated ESR, there was concern that amiodarone could be involved, so this medication was stopped.  CT chest in 6/20 showed LLL consolidation/collapse with small left effusion, there was a rim-enhancing lesion posterior to the heart between esophagus and left atrium, ?loculated fluid.  TEE was done in 7/20 and confirmed loculated pleural effusion behind the left atrium.  In 7/20, patient had VATS on the left.   Carotid dopplers in 9/21 showed 80-99% RICA stenosis, 60-60% LICA.  AAA [place] in 9/21 showed 4.5 cm AAA. Patient was referred to Dr. Trula Slade for evaluation => he has had TCAR on right in 12/21.  He had left TCAR in 5/22.   Cardiolite in 12/21 with EF 41%, no ischemia, fixed inferolateral defect. Echo in 1/22 showed EF 40-45%, basal to mid inferolateral AK, basal inferior HK, normal RV, mild aortic stenosis mean gradient 11 mmHg.   Patient returns for followup of dyspnea and CAD.  He was unable to tolerate dapagliflozin and is off it.  No palpitations, NSR today.  No dyspnea walking on flat ground though he does get short of breath with hills/inclines.  He walks for 1 mile at the [facility] several times/week. No chest pain.  No lightheadedness.  He has left  knee pain/osteoarthritis and may need TKR.    ECG (personally reviewed): NSR, LAFB, RBBB   Labs (2/15): K 4.2, creatinine 1.2, LDL 87, HDL 39, BNP 71 Labs (4/15): K 3.9, creatinine 1.1 Labs (12/15): LDL 73, HDL 37, K 3.9, creatinine 1.0 Labs (12/16): LDL 71, HDL 49 Labs (3/17): K 4.2, creatinine 1.28, HCT 45 Labs (8/18): LDL 82, HDL 41, TSH normal, hgb 16.3, K 4.1, creatinine 1.05 Labs (2/20): LFTs normal, pro BNP 842 Labs (3/20): K 3.8, creatinine 1.48, TSH mildly elevated at 6.97, free T3 and free T4 normal Labs  (4/20): K 3.9, creatinine 1.36 Labs (2/20): LDL 52 Labs (8/20): BNP 241, creatinine 1.21  Labs (9/20): BNP 244, K 4, creatinine 1.12 Labs (2/21): K 4.3, creatinine 1.26, BNP 271 Labs (6/21): K 4.2, creatinine 1.11, LDL 57 Labs (6/22): K 4.3, creatinine 1.16, LDL 49   PMH: 1. Essential tremor 2. CAD: s/p CABG in 3/12.   - Cardiolite (8/18): EF 52%, no ischemia/infarction.  - NSTEMI (1/20):  LHC showed new occlusion of the branch of SVG-ramus and OM that touched down on OM.  There were also serial 70% stenoses in the mid/distal RCA.  FFR of RCA was negative.  - Cardiolite (12/21): EF 41%, no ischemia, fixed inferolateral defect 3. Atrial fibrillation: Only noted post-op CABG.  4. PE: Post-op CABG in 2012.  5. AAA: 3.6 cm on [place] in 3/14.  3.6 cm on [place] in 3/15. 3.6 cm on [place] 8/16. 4.1 cm on [place] 4/17.  - AAA [place] (5/18): 4.1 cm AAA.  - AAA [place] (5/19): 4.1 cm AAA.  - AAA [place] (4/20): 4.1 cm AAA.  - AAA [place] (9/21): 4.5 cm AAA 6. OSA: On CPAP.  7. Carotid stenosis: Carotid dopplers (0/17) with 49-44% LICA stenosis.  Carotid dopplers (9/67) with 59-16% LICA stenosis. Carotid dopplers (3/16) with 60-79% RCIA stenosis, 38-46% LICA stenosis.  - Carotid dopplers (8/16) with 40-59% RICA stenosis, 65-99% LICA stenosis.  - Carotid dopplers (8/17) with 35-70% RICA, 17-79% LICA. - Carotid dopplers (9/18) with 39-03% RICA, 00-92% LICA.   - Carotid dopplers (9/19) with 33-00% RICA, 76-22% LICA.  - Carotid dopplers (9/20) with 60-79% BICA - Carotid dopplers (9/21): 63-33% RICA, 54-56% LICA - Right TCAR 25/63, left TCAR 5/22.  8. Asthma 9. PNA x 2 10. Chronic diastolic CHF/dyspnea: Echo (9/14) with EF 60-65%, mild LVH, very mild AS with mean gradient 10 mmHg, RV moderately dilated.  PFTs (3/14) showed mild obstructive airways disease.  V/Q scan (3/14) with no PE.  ENT workup for upper respiratory causes was negative.  RHC (2/15) with mean RA 7, PA 32/12, mean PCWP 13, CI 3.18.  CPX (3/15) with peak VO2 16.4,  VE/VCO2 33; normal when compared to age-matched sedentary normals; chronotropic incompetence was noted.  Dyspnea was improved with weight loss.  - Echo (8/17): EF 60-65%, mild AS.  - Echo (1/20): EF 55-60%, normal RV size and systolic function.  - Echo (6/20): EF 55%, mild LVH, mild AS, normal RV size and systolic function, ?LA mass or mass impinging on posterior LA.  - TEE (7/20): EF 50-55%, mild LVH, basal inferolateral aneurysm, normal RV size/systolic function, loculated pleural fluid behind LA.  - Echo (1/22): EF 40-45%, basal to mid inferolateral AK, basal inferior HK, normal RV, mild aortic stenosis mean gradient 11 mmHg. 11. HTN 12. Aortic stenosis: Mild.  13. Ascending aortic aneurysm: CTA chest with 4.4 cm ascending aorta in 1/20.  - 4.2 cm ascending aorta on CT chest 6/20.  14. Atrial fibrillation: Paroxysmal.  15. CKD: Stage 3.  16. Left lung spontaneous PTX then recurrent left pleural effusion.  - Left VATS in 7/20.  17. Palpitations:  - Zio patch (6/20): NSR with 1 short NSVT run and few short SVT runs, no atrial fibrillation.  - Zio patch 2 wks (2/21): 6 short NSVT runs, 66 short SVT runs, rare PACs/PVCs.  69. COVID-19 infection 4/22.    Current Outpatient Medications  Medication Sig Dispense Refill   acetaminophen (TYLENOL) 500 MG tablet Take 1,000 mg by mouth every 6 (six) hours as needed for moderate pain or headache.     apixaban (ELIQUIS) 5 MG TABS tablet Take 5 mg by mouth 2 (two) times daily.     Ascorbic Acid 500 MG CAPS Take 500 mg by mouth daily.     atorvastatin (LIPITOR) 80 MG tablet Take 1 tablet (80 mg total) by mouth daily. 30 tablet 6   carvedilol (COREG) 3.125 MG tablet Take 1 tablet (3.125 mg total) by mouth 2 (two) times daily. 60 tablet 11   cetirizine (ZYRTEC) 10 MG tablet Take 10 mg by mouth daily.     cholecalciferol (VITAMIN D) 1000 UNITS tablet Take 1,000 Units by mouth every evening.     isosorbide mononitrate (IMDUR) 30 MG 24 hr tablet Take 1  tablet (30 mg total) by mouth daily.     levothyroxine (SYNTHROID, LEVOTHROID) 50 MCG tablet Take 50 mcg by mouth daily before breakfast.     Multiple Vitamins-Minerals (CENTRUM SILVER PO) Take 1 tablet by mouth daily.      pantoprazole (PROTONIX) 40 MG tablet Take 40 mg by mouth daily.      sacubitril-valsartan (ENTRESTO) 24-26 MG Take 1 tablet by mouth 2 (two) times daily. 60 tablet 11   tamsulosin (FLOMAX) 0.4 MG CAPS capsule Take 0.4 mg by mouth daily after supper.      traMADol (ULTRAM) 50 MG tablet Take 1 tablet (50 mg total) by mouth every 6 (six) hours as needed for moderate pain. 20 tablet 0   furosemide (LASIX) 40 MG tablet Take 0.5 tablets (20 mg total) by mouth every other day. 20 tablet 3   No current facility-administered medications for this encounter.    Allergies:   Ancef [cefazolin sodium] and Vancomycin   Social History:  The patient  reports that he quit smoking about 37 years ago. His smoking use included cigarettes. He has a 35.00 pack-year smoking history. He has never used smokeless tobacco. He reports current alcohol use of about 2.0 standard drinks per week. He reports that he does not use drugs.   Family History:  The patient's family history includes Coronary artery disease in his mother; Heart disease in his mother; Hypertension in his mother.   ROS:  Please see the history of present illness.   All other systems are personally reviewed and negative.   Exam:   BP 134/72   Pulse 60   Wt 96.3 kg (212 lb 6.4 oz)   SpO2 91%   BMI 28.81 kg/m  General: NAD Neck: No JVD, no thyromegaly or thyroid nodule.  Lungs: Decreased BS left base. CV: Nondisplaced PMI.  Heart regular S1/S2, no S3/S4, 3/6 early SEM RUSB.  1+ ankle edema.  No carotid bruit.  Normal pedal pulses.  Abdomen: Soft, nontender, no hepatosplenomegaly, no distention.  Skin: Intact without lesions or rashes.  Neurologic: Alert and oriented x 3.  Psych: Normal affect. Extremities: No clubbing or  cyanosis.  HEENT: Normal.  Recent Labs: 01/31/2021: ALT 14 05/07/2021: B Natriuretic Peptide 443.3; BUN 17; Creatinine, Ser 1.10; Hemoglobin 13.5; Platelets 164; Potassium 4.0; Sodium 140  Personally reviewed   Wt Readings from Last 3 Encounters:  05/07/21 96.3 kg (212 lb 6.4 oz)  03/11/21 93 kg (205 lb)  02/08/21 93.9 kg (207 lb)      ASSESSMENT AND PLAN:  1. CAD: s/p CABG 3/12. NSTEMI 1/20, LHC showed new occlusion of the branch of SVG-ramus and OM that touched down on OM.  There were also serial 70% stenoses in the mid/distal RCA.  FFR of RCA was negative.  No interventional target.  Cardiolite in 12/21 showed inferolateral infarction with no ischemia, consistent with findings on NSTEMI in 1/20.  Rare atypical chest pain.    - No ASA given Eliquis use.  - Continue Imdur 30 mg daily.  - Continue atorvastatin 80 daily.   - Off ranolazine with prolonged QTc.  2. Hyperlipidemia: Good lipids 6/22.  - On atorvastatin 80 daily 3. Chronic HF with mid range EF: TEE in 7/20 showed EF 50-55%, basal inferolateral aneurysm, and normal RV.  Echo in 1/22 with EF 40-45%, basal-mid inferolateral akinesis and inferior hypokinesis, normal RV.  He is not volume overloaded on exam.  NYHA class II, I think that his dyspnea is primarily related to his left lung disease (mild and stable).  He was unable to tolerate dapagliflozin.  - Start Entresto 24/26 bid and decrease Lasix to 20 mg every other day. BMET today and in 10 days.  - Continue Coreg 3.125 mg bid.  4. Spontaneous left PTX with recurrent left-sided pleural effusion:  He is s/p left VATS/pleurodesis in 7/20.    5. Carotid stenosis: S/p bilateral TCARs, followed at [facility].     6. AAA: 4.5 cm AAA in 9/21, repeat in 9/22.      7. Aortic stenosis: Mild.   8. Atrial fibrillation: Paroxysmal. He is significantly symptomatic while in atrial fibrillation.  He is in NSR today.  He is off amiodarone due to concern for pulmonary toxicity.   - Continue Eliquis  5 mg bid.  CBC today.   Recommended follow-up:  3 months.   Signed, [name], MD  05/07/2021  [place] [address] [facility] [place] [address] [phone] (office) [phone] (fax)

## 2021-05-12 IMAGING — DX CHEST  1 VIEW
1 series · 1 of 1 positions shown · non-contrast
Comparison: Radiographs February 21, 2019.

CLINICAL DATA: Status post left thoracentesis.

EXAM:
CHEST  1 VIEW

[chest pa]
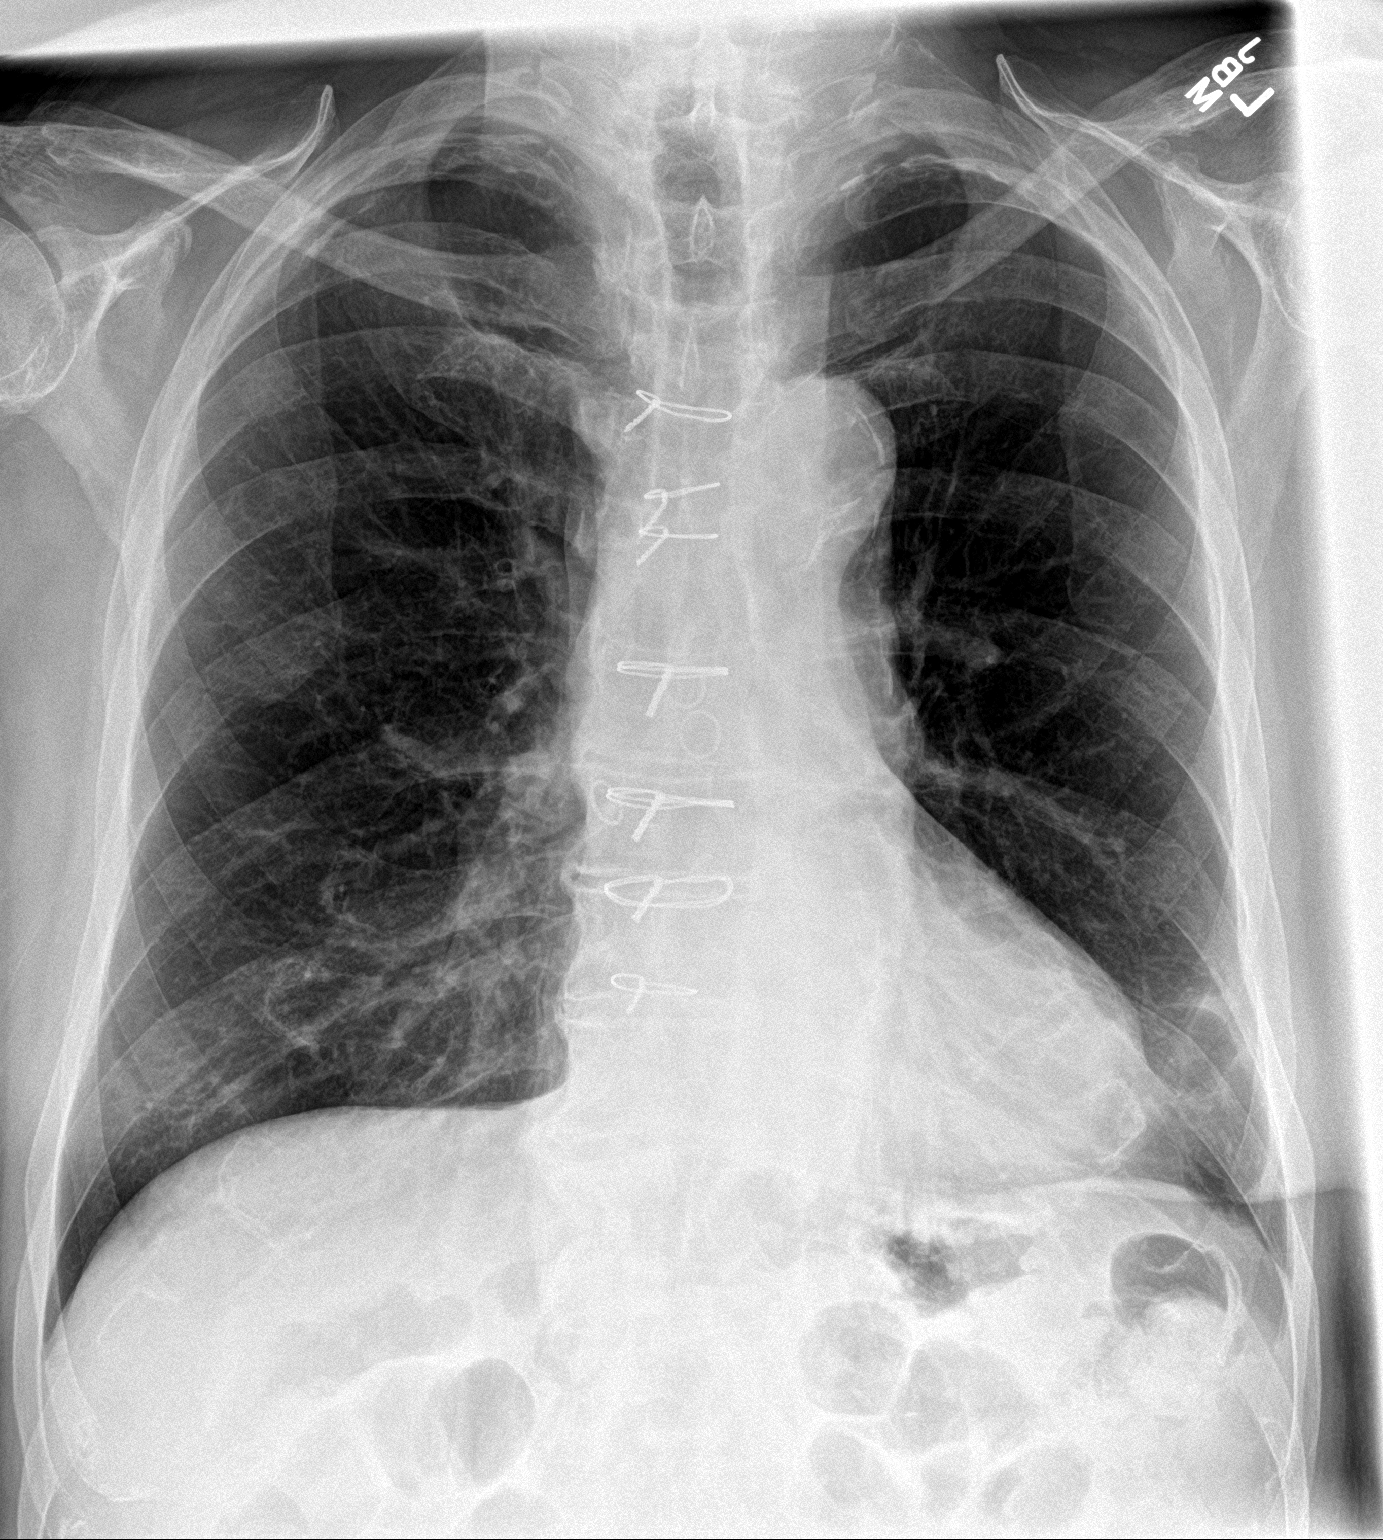

[1 of 1 positions shown; findings below may reference images not displayed]

FINDINGS: Stable cardiomediastinal silhouette. Status post coronary bypass
graft. Atherosclerosis of thoracic aorta is noted. No pneumothorax
is noted. Right lung is clear. Left pleural effusion noted on prior
exam has resolved status post thoracentesis. Minimal left basilar
residual atelectasis or scarring remains. Bony thorax is
unremarkable.
IMPRESSION: Left pleural effusion noted on prior exam has resolved status post
thoracentesis. Minimal left basilar residual atelectasis or scarring
remains. No pneumothorax is noted.

Aortic Atherosclerosis ([id number]).

## 2021-05-13 ENCOUNTER — Telehealth (HOSPITAL_COMMUNITY): Payer: Self-pay | Admitting: *Deleted

## 2021-05-13 NOTE — Telephone Encounter (Signed)
Per Dr Aundra Dubin he would like to keep pts on Entresto if possible, he would like him to get labs done this week (bmet) and decrease Lasix to every 3rd day. Pt is aware and agreeable, he did take his Lasix today but will not take it again until thur. He states he does have an appt tomorrow AM and doesn't want to take Entresto before going to that appt as he has to drive, advised pt ok to skip tomorrow AM dose of entresto, lab appt sch for thur.

## 2021-05-13 NOTE — Telephone Encounter (Signed)
Pt called to report that he started Entresto 24/26 mg BID last week as ordered and decreased his Lasix to QOD, however he has been feeling lightheaded/dizzy/presyncopal for past coulple of days, he says this occurs shortly after taking his Entresto and last for hours after. He states he has been checking his BP and it has been running high. Advised pt I would discuss with Dr Aundra Dubin and call him back later.

## 2021-05-16 ENCOUNTER — Ambulatory Visit (HOSPITAL_COMMUNITY)
Admission: RE | Admit: 2021-05-16 | Discharge: 2021-05-16 | Disposition: A | Discharge status: Home / Self Care | Payer: Medicare Other | Source: Ambulatory Visit | Attending: Cardiology | Admitting: Cardiology

## 2021-05-16 ENCOUNTER — Other Ambulatory Visit: Payer: Self-pay

## 2021-05-16 DIAGNOSIS — I5032 Chronic diastolic (congestive) heart failure: Secondary | ICD-10-CM | POA: Insufficient documentation

## 2021-05-16 LAB — BASIC METABOLIC PANEL
Anion gap: 8 (ref 5–15)
BUN: 20 mg/dL (ref 8–23)
CO2: 27 mmol/L (ref 22–32)
Calcium: 9.6 mg/dL (ref 8.9–10.3)
Chloride: 106 mmol/L (ref 98–111)
Creatinine, Ser: 1.37 mg/dL — ABNORMAL HIGH (ref 0.61–1.24)
GFR, Estimated: 50 mL/min — ABNORMAL LOW (ref >60)
Glucose, Bld: 100 mg/dL — ABNORMAL HIGH (ref 70–99)
Potassium: 4.3 mmol/L (ref 3.5–5.1)
Sodium: 141 mmol/L (ref 135–145)

## 2021-05-17 ENCOUNTER — Other Ambulatory Visit (HOSPITAL_COMMUNITY): Payer: Medicare Other

## 2021-05-17 ENCOUNTER — Encounter (HOSPITAL_COMMUNITY): Payer: Self-pay

## 2021-05-24 ENCOUNTER — Telehealth (HOSPITAL_COMMUNITY): Payer: Self-pay | Admitting: *Deleted

## 2021-05-24 NOTE — Telephone Encounter (Signed)
Pt left vm stating he is still dizzy and experiencing blurred vision. He said his bp is still low.   Routed to [place] for advice

## 2021-05-24 NOTE — Telephone Encounter (Signed)
Stop Entresto.  Wait a day and start on valsartan 20 mg bid and start back on Lasix 20 mg every other day.

## 2021-05-28 MED ORDER — VALSARTAN 40 MG PO TABS: 20.0000 mg | ORAL_TABLET | Freq: Two times a day (BID) | ORAL | 3 refills | Status: DC

## 2021-05-28 NOTE — Telephone Encounter (Signed)
Pt aware and agreeable with plan.  

## 2021-06-06 ENCOUNTER — Other Ambulatory Visit (HOSPITAL_COMMUNITY): Payer: Self-pay | Admitting: Cardiology

## 2021-06-06 DIAGNOSIS — I714 Abdominal aortic aneurysm, without rupture, unspecified: Secondary | ICD-10-CM

## 2021-06-11 ENCOUNTER — Other Ambulatory Visit (HOSPITAL_COMMUNITY): Payer: Self-pay | Admitting: Cardiology

## 2021-06-11 ENCOUNTER — Ambulatory Visit (HOSPITAL_COMMUNITY)
Admission: RE | Admit: 2021-06-11 | Discharge: 2021-06-11 | Disposition: A | Discharge status: Home / Self Care | Payer: Medicare Other | Source: Ambulatory Visit | Attending: Cardiology | Admitting: Cardiology

## 2021-06-11 ENCOUNTER — Other Ambulatory Visit: Payer: Self-pay

## 2021-06-11 DIAGNOSIS — I714 Abdominal aortic aneurysm, without rupture, unspecified: Secondary | ICD-10-CM

## 2021-08-05 DIAGNOSIS — M1990 Unspecified osteoarthritis, unspecified site: Secondary | ICD-10-CM | POA: Diagnosis not present

## 2021-08-05 DIAGNOSIS — I350 Nonrheumatic aortic (valve) stenosis: Secondary | ICD-10-CM | POA: Diagnosis not present

## 2021-08-05 DIAGNOSIS — E785 Hyperlipidemia, unspecified: Secondary | ICD-10-CM | POA: Diagnosis not present

## 2021-08-05 DIAGNOSIS — R972 Elevated prostate specific antigen [PSA]: Secondary | ICD-10-CM | POA: Diagnosis not present

## 2021-08-05 DIAGNOSIS — J45909 Unspecified asthma, uncomplicated: Secondary | ICD-10-CM | POA: Diagnosis not present

## 2021-08-05 DIAGNOSIS — G4733 Obstructive sleep apnea (adult) (pediatric): Secondary | ICD-10-CM | POA: Diagnosis not present

## 2021-08-05 DIAGNOSIS — E669 Obesity, unspecified: Secondary | ICD-10-CM | POA: Diagnosis not present

## 2021-08-05 DIAGNOSIS — E1122 Type 2 diabetes mellitus with diabetic chronic kidney disease: Secondary | ICD-10-CM | POA: Diagnosis not present

## 2021-08-05 DIAGNOSIS — I714 Abdominal aortic aneurysm, without rupture, unspecified: Secondary | ICD-10-CM | POA: Diagnosis not present

## 2021-08-05 DIAGNOSIS — I700 Atherosclerosis of aorta: Secondary | ICD-10-CM | POA: Diagnosis not present

## 2021-08-05 DIAGNOSIS — I6529 Occlusion and stenosis of unspecified carotid artery: Secondary | ICD-10-CM | POA: Diagnosis not present

## 2021-08-05 DIAGNOSIS — E039 Hypothyroidism, unspecified: Secondary | ICD-10-CM | POA: Diagnosis not present

## 2021-08-09 ENCOUNTER — Encounter (HOSPITAL_COMMUNITY): Payer: Medicare Other | Admitting: Cardiology

## 2021-08-12 ENCOUNTER — Ambulatory Visit (HOSPITAL_COMMUNITY)
Admission: RE | Admit: 2021-08-12 | Discharge: 2021-08-12 | Disposition: A | Discharge status: Home / Self Care | Payer: Medicare Other | Source: Ambulatory Visit | Attending: Cardiology | Admitting: Cardiology

## 2021-08-12 VITALS — BP 130/70 | HR 65 | Wt 207.8 lb

## 2021-08-12 DIAGNOSIS — Z7901 Long term (current) use of anticoagulants: Secondary | ICD-10-CM | POA: Diagnosis not present

## 2021-08-12 DIAGNOSIS — I491 Atrial premature depolarization: Secondary | ICD-10-CM | POA: Insufficient documentation

## 2021-08-12 DIAGNOSIS — I110 Hypertensive heart disease with heart failure: Secondary | ICD-10-CM | POA: Diagnosis not present

## 2021-08-12 DIAGNOSIS — I5032 Chronic diastolic (congestive) heart failure: Secondary | ICD-10-CM | POA: Diagnosis not present

## 2021-08-12 DIAGNOSIS — I2510 Atherosclerotic heart disease of native coronary artery without angina pectoris: Secondary | ICD-10-CM | POA: Insufficient documentation

## 2021-08-12 DIAGNOSIS — I252 Old myocardial infarction: Secondary | ICD-10-CM | POA: Insufficient documentation

## 2021-08-12 DIAGNOSIS — Z951 Presence of aortocoronary bypass graft: Secondary | ICD-10-CM | POA: Insufficient documentation

## 2021-08-12 DIAGNOSIS — I480 Paroxysmal atrial fibrillation: Secondary | ICD-10-CM | POA: Diagnosis not present

## 2021-08-12 DIAGNOSIS — E785 Hyperlipidemia, unspecified: Secondary | ICD-10-CM | POA: Insufficient documentation

## 2021-08-12 DIAGNOSIS — Z87891 Personal history of nicotine dependence: Secondary | ICD-10-CM | POA: Insufficient documentation

## 2021-08-12 DIAGNOSIS — I4510 Unspecified right bundle-branch block: Secondary | ICD-10-CM | POA: Insufficient documentation

## 2021-08-12 DIAGNOSIS — I714 Abdominal aortic aneurysm, without rupture, unspecified: Secondary | ICD-10-CM | POA: Diagnosis not present

## 2021-08-12 DIAGNOSIS — I350 Nonrheumatic aortic (valve) stenosis: Secondary | ICD-10-CM | POA: Diagnosis not present

## 2021-08-12 LAB — BASIC METABOLIC PANEL
Anion gap: 7 (ref 5–15)
BUN: 20 mg/dL (ref 8–23)
CO2: 26 mmol/L (ref 22–32)
Calcium: 9.3 mg/dL (ref 8.9–10.3)
Chloride: 107 mmol/L (ref 98–111)
Creatinine, Ser: 0.95 mg/dL (ref 0.61–1.24)
GFR, Estimated: >60 mL/min (ref >60)
Glucose, Bld: 103 mg/dL — ABNORMAL HIGH (ref 70–99)
Potassium: 4.6 mmol/L (ref 3.5–5.1)
Sodium: 140 mmol/L (ref 135–145)

## 2021-08-12 LAB — BRAIN NATRIURETIC PEPTIDE: B Natriuretic Peptide: 449.5 pg/mL — ABNORMAL HIGH (ref 0.0–100.0)

## 2021-08-12 MED ORDER — SPIRONOLACTONE 25 MG PO TABS
12.5000 mg | ORAL_TABLET | Freq: Every day | ORAL | 3 refills | Status: DC
Start: 2021-08-12 — End: 2021-11-15

## 2021-08-12 NOTE — Progress Notes (Signed)
HDate:  08/12/2021   ID:  Eric Gordon, DOB 1930/12/07, MRN [id number]   Provider location: [facility] Type of Visit: Established patient  PCP:  Shon Baton, MD  Cardiologist:  Dr. Aundra Dubin   History of Present Illness: Eric Gordon is a 87 y.o. male with history of CAD s/p CABG.  He has had trouble long-term with exertional dyspnea.  Dyspnea triggered evaluation in 2012 leading to CABG but was not resolved by CABG.  PFTs in 3/14 showed only mild obstructive airways disease and V/Q scan showed no PE.  Echo in 9/14 showed normal LV systolic function with moderately dilated RV. [place] in 2/15 showed normal right and left heart filling pressures and normal PA pressure.  Finally, he had a CPX in 3/15 that showed normal capacity compared to age-matched sedentary norms.  He was noted to have chronotropic incompetence.  At a prior appointment, I took him off metoprolol given chronotropic incompetence noted on CPX.  Dyspnea improved significantly with weight loss.  He had Cardiolite in 8/18 with EF 52%, no ischemia or infarction.    He was admitted in 1/20 with NSTEMI.  LHC showed new occlusion of the branch of SVG-ramus and OM that touched down on OM.  There were also serial 70% stenoses in the mid/distal RCA.  There was not thought to be an interventional option and patient was treated medically. He was discharged home but presented a couple days later with recurrent chest pain and dyspnea.  Cath was repeated showing no change from prior.  This admission, he had FFR of the RCA which did not suggest hemodynamic significance.  Echo showed EF 55-60%, normal RV.  CTA did not show a PE. He was noted to be volume overloaded and was diuresed.  He was also noted to be in atrial fibrillation with RVR transiently.  ASA/Plavix was stopped and Eliquis was started. He was discharged to SNF.  Atrial fibrillation recurred and he was started on amiodarone with conversion back to NSR.    In 4/20, he was admitted with left spontaneous PTX requiring chest tube.  After this, he developed recurrent left pleural effusions requiring thoracentesis x 3 so far.  Pleural fluid was exudative with eosinophil predominance.  With elevated ESR, there was concern that amiodarone could be involved, so this medication was stopped.  CT chest in 6/20 showed LLL consolidation/collapse with small left effusion, there was a rim-enhancing lesion posterior to the heart between esophagus and left atrium, ?loculated fluid.  TEE was done in 7/20 and confirmed loculated pleural effusion behind the left atrium.  In 7/20, patient had VATS on the left.   Carotid dopplers in 9/21 showed 80-99% RICA stenosis, 55-37% LICA.  AAA [place] in 9/21 showed 4.5 cm AAA. Patient was referred to Dr. Trula Slade for evaluation => he has had TCAR on right in 12/21.  He had left TCAR in 5/22.   Cardiolite in 12/21 with EF 41%, no ischemia, fixed inferolateral defect. Echo in 1/22 showed EF 40-45%, basal to mid inferolateral AK, basal inferior HK, normal RV, mild aortic stenosis mean gradient 11 mmHg.  He was unable to tolerate dapagliflozin and is off it. He was lightheaded on low dose Entresto so this was stopped.    Patient returns for followup of dyspnea and CAD.   He is doing well generally.  He is able to walk 1.25 miles on a treadmill with no problems.  He goes to the [facility] 5 days/week.  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  Mild dyspnea if he has to push a wheelchair a long distance.  No exertional chest pain.  He does have mild atypical chest pain that is rare.   ECG (personally reviewed): NSR, LAFB, RBBB   Labs (2/15): K 4.2, creatinine 1.2, LDL 87, HDL 39, BNP 71 Labs (4/15): K 3.9, creatinine 1.1 Labs (12/15): LDL 73, HDL 37, K 3.9, creatinine 1.0 Labs (12/16): LDL 71, HDL 49 Labs (3/17): K 4.2, creatinine 1.28, HCT 45 Labs (8/18): LDL 82, HDL 41, TSH normal, hgb  16.3, K 4.1, creatinine 1.05 Labs (2/20): LFTs normal, pro BNP 842 Labs (3/20): K 3.8, creatinine 1.48, TSH mildly elevated at 6.97, free T3 and free T4 normal Labs (4/20): K 3.9, creatinine 1.36 Labs (2/20): LDL 52 Labs (8/20): BNP 241, creatinine 1.21  Labs (9/20): BNP 244, K 4, creatinine 1.12 Labs (2/21): K 4.3, creatinine 1.26, BNP 271 Labs (6/21): K 4.2, creatinine 1.11, LDL 57 Labs (6/22): K 4.3, creatinine 1.16, LDL 49 Labs (8/22): K 4.3, creatinine 1.37   PMH: 1. Essential tremor 2. CAD: s/p CABG in 3/12.   - Cardiolite (8/18): EF 52%, no ischemia/infarction.  - NSTEMI (1/20):  LHC showed new occlusion of the branch of SVG-ramus and OM that touched down on OM.  There were also serial 70% stenoses in the mid/distal RCA.  FFR of RCA was negative.  - Cardiolite (12/21): EF 41%, no ischemia, fixed inferolateral defect 3. Atrial fibrillation: Only noted post-op CABG.  4. PE: Post-op CABG in 2012.  5. AAA: 3.6 cm on [place] in 3/14.  3.6 cm on [place] in 3/15. 3.6 cm on [place] 8/16. 4.1 cm on [place] 4/17.  - AAA [place] (5/18): 4.1 cm AAA.  - AAA [place] (5/19): 4.1 cm AAA.  - AAA [place] (4/20): 4.1 cm AAA.  - AAA [place] (9/21): 4.5 cm AAA - AAA [place] (9/22): 4.5 cm AAA 6. OSA: On CPAP.  7. Carotid stenosis: Carotid dopplers (0/25) with 42-70% LICA stenosis.  Carotid dopplers (6/23) with 76-28% LICA stenosis. Carotid dopplers (3/16) with 60-79% RCIA stenosis, 31-51% LICA stenosis.  - Carotid dopplers (8/16) with 40-59% RICA stenosis, 76-16% LICA stenosis.  - Carotid dopplers (8/17) with 07-37% RICA, 10-62% LICA. - Carotid dopplers (9/18) with 69-48% RICA, 54-62% LICA.   - Carotid dopplers (9/19) with 70-35% RICA, 00-93% LICA.  - Carotid dopplers (9/20) with 60-79% BICA - Carotid dopplers (9/21): 81-82% RICA, 99-37% LICA - Right TCAR 16/96, left TCAR 5/22.  8. Asthma 9. PNA x 2 10. Chronic diastolic CHF/dyspnea: Echo (9/14) with EF 60-65%, mild LVH, very mild AS with mean gradient 10 mmHg, RV moderately dilated.   PFTs (3/14) showed mild obstructive airways disease.  V/Q scan (3/14) with no PE.  ENT workup for upper respiratory causes was negative.  RHC (2/15) with mean RA 7, PA 32/12, mean PCWP 13, CI 3.18.  CPX (3/15) with peak VO2 16.4, VE/VCO2 33; normal when compared to age-matched sedentary normals; chronotropic incompetence was noted.  Dyspnea was improved with weight loss.  - Echo (8/17): EF 60-65%, mild AS.  - Echo (1/20): EF 55-60%, normal RV size and systolic function.  - Echo (6/20): EF 55%, mild LVH, mild AS, normal RV size and systolic function, ?LA mass or mass impinging on posterior LA.  - TEE (7/20): EF 50-55%, mild LVH, basal inferolateral aneurysm, normal RV size/systolic function, loculated pleural fluid behind LA.  - Echo (  1/22): EF 40-45%, basal to mid inferolateral AK, basal inferior HK, normal RV, mild aortic stenosis mean gradient 11 mmHg. 11. HTN 12. Aortic stenosis: Mild.  13. Ascending aortic aneurysm: CTA chest with 4.4 cm ascending aorta in 1/20.  - 4.2 cm ascending aorta on CT chest 6/20.  14. Atrial fibrillation: Paroxysmal.  15. CKD: Stage 3.  16. Left lung spontaneous PTX then recurrent left pleural effusion.  - Left VATS in 7/20.  17. Palpitations:  - Zio patch (6/20): NSR with 1 short NSVT run and few short SVT runs, no atrial fibrillation.  - Zio patch 2 wks (2/21): 6 short NSVT runs, 66 short SVT runs, rare PACs/PVCs.  65. COVID-19 infection 4/22.    Current Outpatient Medications  Medication Sig Dispense Refill   acetaminophen (TYLENOL) 500 MG tablet Take 1,000 mg by mouth every 6 (six) hours as needed for moderate pain or headache.     apixaban (ELIQUIS) 5 MG TABS tablet Take 5 mg by mouth 2 (two) times daily.     Ascorbic Acid 500 MG CAPS Take 500 mg by mouth daily.     atorvastatin (LIPITOR) 80 MG tablet Take 1 tablet (80 mg total) by mouth daily. 30 tablet 6   carvedilol (COREG) 3.125 MG tablet Take 1 tablet (3.125 mg total) by mouth 2 (two) times daily. 60  tablet 11   cetirizine (ZYRTEC) 10 MG tablet Take 10 mg by mouth daily.     cholecalciferol (VITAMIN D) 1000 UNITS tablet Take 1,000 Units by mouth every evening.     furosemide (LASIX) 40 MG tablet Take 0.5 tablets (20 mg total) by mouth every other day. 20 tablet 3   isosorbide mononitrate (IMDUR) 30 MG 24 hr tablet Take 1 tablet (30 mg total) by mouth daily.     levothyroxine (SYNTHROID, LEVOTHROID) 50 MCG tablet Take 50 mcg by mouth daily before breakfast.     Multiple Vitamins-Minerals (CENTRUM SILVER PO) Take 1 tablet by mouth daily.      pantoprazole (PROTONIX) 40 MG tablet Take 40 mg by mouth daily.      spironolactone (ALDACTONE) 25 MG tablet Take 0.5 tablets (12.5 mg total) by mouth daily. 45 tablet 3   tamsulosin (FLOMAX) 0.4 MG CAPS capsule Take 0.4 mg by mouth daily after supper.      traMADol (ULTRAM) 50 MG tablet Take 1 tablet (50 mg total) by mouth every 6 (six) hours as needed for moderate pain. 20 tablet 0   valsartan (DIOVAN) 40 MG tablet Take 0.5 tablets (20 mg total) by mouth 2 (two) times daily. 30 tablet 3   No current facility-administered medications for this encounter.    Allergies:   Ancef [cefazolin sodium] and Vancomycin   Social History:  The patient  reports that he quit smoking about 37 years ago. His smoking use included cigarettes. He has a 35.00 pack-year smoking history. He has never used smokeless tobacco. He reports current alcohol use of about 2.0 standard drinks per week. He reports that he does not use drugs.   Family History:  The patient's family history includes Coronary artery disease in his mother; Heart disease in his mother; Hypertension in his mother.   ROS:  Please see the history of present illness.   All other systems are personally reviewed and negative.   Exam:   BP 130/70   Pulse 65   Wt 94.3 kg (207 lb 12.8 oz)   SpO2 97%   BMI 28.18 kg/m  General: NAD Neck: No JVD,  no thyromegaly or thyroid nodule.  Lungs: Clear to auscultation  bilaterally with normal respiratory effort. CV: Nondisplaced PMI.  Heart regular S1/S2, no S3/S4, 2/6 early SEM RUSB.  1+ ankle edema.  No carotid bruit.  Normal pedal pulses.  Abdomen: Soft, nontender, no hepatosplenomegaly, no distention.  Skin: Intact without lesions or rashes.  Neurologic: Alert and oriented x 3.  Psych: Normal affect. Extremities: No clubbing or cyanosis.  HEENT: Normal.   Recent Labs: 01/31/2021: ALT 14 05/07/2021: Hemoglobin 13.5; Platelets 164 08/12/2021: B Natriuretic Peptide 449.5; BUN 20; Creatinine, Ser 0.95; Potassium 4.6; Sodium 140  Personally reviewed   Wt Readings from Last 3 Encounters:  08/12/21 94.3 kg (207 lb 12.8 oz)  05/07/21 96.3 kg (212 lb 6.4 oz)  03/11/21 93 kg (205 lb)      ASSESSMENT AND PLAN:  1. CAD: s/p CABG 3/12. NSTEMI 1/20, LHC showed new occlusion of the branch of SVG-ramus and OM that touched down on OM.  There were also serial 70% stenoses in the mid/distal RCA.  FFR of RCA was negative.  No interventional target.  Cardiolite in 12/21 showed inferolateral infarction with no ischemia, consistent with findings on NSTEMI in 1/20.  Rare atypical chest pain.    - No ASA given Eliquis use.  - Continue Imdur 30 mg daily.  - Continue atorvastatin 80 daily.   - Off ranolazine with prolonged QTc.  2. Hyperlipidemia: Good lipids 6/22.  - On atorvastatin 80 daily 3. Chronic HF with mid range EF: TEE in 7/20 showed EF 50-55%, basal inferolateral aneurysm, and normal RV.  Echo in 1/22 with EF 40-45%, basal-mid inferolateral akinesis and inferior hypokinesis, normal RV.  He is not volume overloaded on exam.  NYHA class II, I think that his dyspnea is primarily related to his left lung disease (mild and stable).  He was unable to tolerate dapagliflozin and became lightheaded/hypotensive with Entresto.  - Continue valsartan 20 mg bid.  - Continue Coreg 3.125 mg bid.  - Add spironolactone 12.5 mg daily, BMET/BNP today and BMET in 10 days.  -  Repeat echo at followup in 3 months.  4. Spontaneous left PTX with recurrent left-sided pleural effusion:  He is s/p left VATS/pleurodesis in 7/20.    5. Carotid stenosis: S/p bilateral TCARs, followed at [facility].     6. AAA: 4.5 cm AAA in 9/22, repeat in 4/22.      7. Aortic stenosis: Mild.   8. Atrial fibrillation: Paroxysmal. He is significantly symptomatic while in atrial fibrillation.  He is in NSR today.  He is off amiodarone due to concern for pulmonary toxicity.   - Continue Eliquis 5 mg bid.   Recommended follow-up:  3 months with echo.   Signed, [name], MD  08/12/2021  [facility] [address] [facility] [place] [address] [phone] (office) [phone] (fax)

## 2021-08-12 NOTE — Patient Instructions (Signed)
EKG done today.  Labs done today. We will contact you only if your labs are abnormal.  START Spironolactone 12.5mg  (1/2 tablet) by mouth daily.   No other medication changes were made. Please continue all current medications as prescribed.  Your physician recommends that you schedule a follow-up appointment in: 10 days for a lab only appointment and in 3 months with an echo prior to your exam.  Your physician has requested that you have an echocardiogram. Echocardiography is a painless test that uses sound waves to create images of your heart. It provides your doctor with information about the size and shape of your heart and how well your heart's chambers and valves are working. This procedure takes approximately one hour. There are no restrictions for this procedure.  If you have any questions or concerns before your next appointment please send [place] a message through [place] or call our office at [phone].    TO LEAVE A MESSAGE FOR THE NURSE SELECT OPTION 2, PLEASE LEAVE A MESSAGE INCLUDING: YOUR NAME DATE OF BIRTH CALL BACK NUMBER REASON FOR CALL**this is important as we prioritize the call backs  YOU WILL RECEIVE A CALL BACK THE SAME DAY AS LONG AS YOU CALL BEFORE 4:00 PM   Do the following things EVERYDAY: Weigh yourself in the morning before breakfast. Write it down and keep it in a log. Take your medicines as prescribed Eat low salt foods--Limit salt (sodium) to 2000 mg per day.  Stay as active as you can everyday Limit all fluids for the day to less than 2 liters   At the [facility], you and your health needs are our priority. As part of our continuing mission to provide you with exceptional heart care, we have created designated Provider Care Teams. These Care Teams include your primary Cardiologist (physician) and Advanced Practice Providers (APPs- Physician Assistants and Nurse Practitioners) who all work together to provide you with the care you need,  when you need it.   You may see any of the following providers on your designated Care Team at your next follow up: Dr Glori Bickers Dr Haynes Kerns, NP Lyda Jester, [place] Audry Riles, PharmD   Please be sure to bring in all your medications bottles to every appointment.

## 2021-08-23 ENCOUNTER — Ambulatory Visit (HOSPITAL_COMMUNITY)
Admission: RE | Admit: 2021-08-23 | Discharge: 2021-08-23 | Disposition: A | Discharge status: Home / Self Care | Payer: Medicare Other | Source: Ambulatory Visit | Attending: Cardiology | Admitting: Cardiology

## 2021-08-23 ENCOUNTER — Other Ambulatory Visit: Payer: Self-pay

## 2021-08-23 DIAGNOSIS — I5032 Chronic diastolic (congestive) heart failure: Secondary | ICD-10-CM | POA: Diagnosis not present

## 2021-08-23 LAB — BASIC METABOLIC PANEL
Anion gap: 3 — ABNORMAL LOW (ref 5–15)
BUN: 20 mg/dL (ref 8–23)
CO2: 29 mmol/L (ref 22–32)
Calcium: 9.3 mg/dL (ref 8.9–10.3)
Chloride: 107 mmol/L (ref 98–111)
Creatinine, Ser: 1.18 mg/dL (ref 0.61–1.24)
GFR, Estimated: 60 mL/min — ABNORMAL LOW (ref >60)
Glucose, Bld: 103 mg/dL — ABNORMAL HIGH (ref 70–99)
Potassium: 3.9 mmol/L (ref 3.5–5.1)
Sodium: 139 mmol/L (ref 135–145)

## 2021-09-26 ENCOUNTER — Telehealth (HOSPITAL_COMMUNITY): Payer: Self-pay | Admitting: *Deleted

## 2021-09-26 NOTE — Telephone Encounter (Signed)
Pt called stating his apple watch has showed him in afib 18 different times since 12/18. Pt said he is asymptomatic but just wanted to report this to [place]. pt said his watch currently shows NSR.   Routed to [place]

## 2021-09-26 NOTE — Telephone Encounter (Signed)
Let's have him do a formal Zio patch monitor x 2 wks.

## 2021-09-27 ENCOUNTER — Encounter (HOSPITAL_COMMUNITY): Payer: Self-pay

## 2021-09-27 ENCOUNTER — Ambulatory Visit (HOSPITAL_COMMUNITY): Admission: RE | Admit: 2021-09-27 | Payer: Medicare Other | Source: Ambulatory Visit

## 2021-09-27 ENCOUNTER — Ambulatory Visit (HOSPITAL_COMMUNITY)
Admission: RE | Admit: 2021-09-27 | Discharge: 2021-09-27 | Disposition: A | Discharge status: Home / Self Care | Payer: Medicare Other | Source: Ambulatory Visit | Attending: Cardiology | Admitting: Cardiology

## 2021-09-27 ENCOUNTER — Other Ambulatory Visit (HOSPITAL_COMMUNITY): Payer: Self-pay | Admitting: Cardiology

## 2021-09-27 ENCOUNTER — Other Ambulatory Visit: Payer: Self-pay

## 2021-09-27 DIAGNOSIS — I4891 Unspecified atrial fibrillation: Secondary | ICD-10-CM

## 2021-09-27 NOTE — Telephone Encounter (Signed)
Spoke with pt he will come to the office today for zio patch

## 2021-10-09 DIAGNOSIS — I4891 Unspecified atrial fibrillation: Secondary | ICD-10-CM | POA: Diagnosis not present

## 2021-10-15 ENCOUNTER — Telehealth (HOSPITAL_COMMUNITY): Payer: Self-pay

## 2021-10-15 DIAGNOSIS — I480 Paroxysmal atrial fibrillation: Secondary | ICD-10-CM

## 2021-10-15 NOTE — Telephone Encounter (Addendum)
Pt aware, agreeable, and verbalized understanding Referral sent to EP  ----- Message from Larey Dresser, MD sent at 10/13/2021 10:15 PM EST ----- 25% atrial flutter.  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  ?Ablation candidate given symptomatic atrial flutter.  I would like him to be referred to EP.

## 2021-10-20 DIAGNOSIS — N1831 Chronic kidney disease, stage 3a: Secondary | ICD-10-CM | POA: Diagnosis not present

## 2021-10-20 DIAGNOSIS — I480 Paroxysmal atrial fibrillation: Secondary | ICD-10-CM | POA: Diagnosis not present

## 2021-10-20 DIAGNOSIS — I10 Essential (primary) hypertension: Secondary | ICD-10-CM | POA: Diagnosis not present

## 2021-10-20 DIAGNOSIS — I130 Hypertensive heart and chronic kidney disease with heart failure and stage 1 through stage 4 chronic kidney disease, or unspecified chronic kidney disease: Secondary | ICD-10-CM | POA: Diagnosis not present

## 2021-10-29 DIAGNOSIS — Z20822 Contact with and (suspected) exposure to covid-19: Secondary | ICD-10-CM | POA: Diagnosis not present

## 2021-11-01 DIAGNOSIS — Z20822 Contact with and (suspected) exposure to covid-19: Secondary | ICD-10-CM | POA: Diagnosis not present

## 2021-11-05 ENCOUNTER — Encounter: Payer: Self-pay | Admitting: Internal Medicine

## 2021-11-05 ENCOUNTER — Other Ambulatory Visit: Payer: Self-pay

## 2021-11-05 ENCOUNTER — Ambulatory Visit (INDEPENDENT_AMBULATORY_CARE_PROVIDER_SITE_OTHER): Payer: Medicare Other | Admitting: Internal Medicine

## 2021-11-05 VITALS — BP 118/76 | HR 60 | Ht 72.0 in | Wt 209.6 lb

## 2021-11-05 DIAGNOSIS — I480 Paroxysmal atrial fibrillation: Secondary | ICD-10-CM | POA: Diagnosis not present

## 2021-11-05 DIAGNOSIS — I10 Essential (primary) hypertension: Secondary | ICD-10-CM

## 2021-11-05 DIAGNOSIS — I5020 Unspecified systolic (congestive) heart failure: Secondary | ICD-10-CM

## 2021-11-05 NOTE — Progress Notes (Signed)
HPI Eric Gordon is referred by Dr. Aundra Dubin for evaluation of atrial arrhythmias. He has a h/o PAF and has been on eliquis in the past. He was on amiodarone but developed lung toxicity. He wore a cardiac monitor and had atrial fib and possibly flutter vs atrial tachycardia as well as NSVT. He does not have syncope and his palpitations are fairly well controlled. His smart watch showed that his atrial arrhythmias have been quiet the last 6 weeks. He has preserved LV function by echo. He denies syncope. He has been exercising regularly and without limit.  Allergies  Allergen Reactions   Ancef [Cefazolin Sodium] Hives   Vancomycin Rash     Current Outpatient Medications  Medication Sig Dispense Refill   acetaminophen (TYLENOL) 500 MG tablet Take 1,000 mg by mouth every 6 (six) hours as needed for moderate pain or headache.     apixaban (ELIQUIS) 5 MG TABS tablet Take 5 mg by mouth 2 (two) times daily.     Ascorbic Acid 500 MG CAPS Take 500 mg by mouth daily.     atorvastatin (LIPITOR) 80 MG tablet Take 1 tablet (80 mg total) by mouth daily. 30 tablet 6   carvedilol (COREG) 3.125 MG tablet Take 1 tablet (3.125 mg total) by mouth 2 (two) times daily. 60 tablet 11   cetirizine (ZYRTEC) 10 MG tablet Take 10 mg by mouth daily.     cholecalciferol (VITAMIN D) 1000 UNITS tablet Take 1,000 Units by mouth every evening.     furosemide (LASIX) 40 MG tablet Take 0.5 tablets (20 mg total) by mouth every other day. 20 tablet 3   isosorbide mononitrate (IMDUR) 30 MG 24 hr tablet Take 1 tablet (30 mg total) by mouth daily.     levothyroxine (SYNTHROID, LEVOTHROID) 50 MCG tablet Take 50 mcg by mouth daily before breakfast.     Multiple Vitamins-Minerals (CENTRUM SILVER PO) Take 1 tablet by mouth daily.      pantoprazole (PROTONIX) 40 MG tablet Take 40 mg by mouth daily.      spironolactone (ALDACTONE) 25 MG tablet Take 0.5 tablets (12.5 mg total) by mouth daily. 45 tablet 3   tamsulosin (FLOMAX) 0.4  MG CAPS capsule Take 0.4 mg by mouth daily after supper.      traMADol (ULTRAM) 50 MG tablet Take 1 tablet (50 mg total) by mouth every 6 (six) hours as needed for moderate pain. 20 tablet 0   valsartan (DIOVAN) 40 MG tablet Take 0.5 tablets (20 mg total) by mouth 2 (two) times daily. 30 tablet 3   No current facility-administered medications for this visit.     Past Medical History:  Diagnosis Date   Abdominal aortic aneurysm (AAA)    Achilles tendinitis of right lower extremity    Arthritis    "my whole body" (03/19/2018)   Atherosclerosis of coronary artery bypass graft w/o angina pectoris    Atrial fibrillation (HCC)    Barrett's esophagus    CAD (coronary artery disease)    Carotid artery disease (HCC)    CHF (congestive heart failure) (HCC)    Chronic anticoagulation    PE   Chronic lower back pain    Dyspnea    Essential tremor    GERD (gastroesophageal reflux disease)    High cholesterol    Hyperlipidemia    Hyperplasia, prostate    Hypertension    Hypothyroidism    Myocardial infarction [facility])    2 in Jan. 2019   Nail dystrophy  OSA on CPAP    uses CPAP   Osteoarthrosis    Pneumonia    "now and once before" (03/19/2018)   Pulmonary embolism [facility])    April 2012 after CABG   S/P CABG (coronary artery bypass graft)    Sleep apnea    Spontaneous pneumothorax    left spontaneous pneumothorax/left pleural effusion, s/p chest tube 01/09/19; thoracentesis x2, s/p left VATS/tacl pleurodesis 04/12/19    ROS:   All systems reviewed and negative except as noted in the HPI.   Past Surgical History:  Procedure Laterality Date   ACHILLES TENDON REPAIR Bilateral    2006 & 2004   CARDIAC CATHETERIZATION  11/2010   CORONARY ANGIOGRAPHY N/A 10/22/2018   Procedure: CORONARY ANGIOGRAPHY;  Surgeon: Troy Sine, MD;  Location: [place] CV LAB;  Service: Cardiovascular;  Laterality: N/A;   CORONARY ARTERY BYPASS GRAFT  March 2012   CABG X 5    ESOPHAGOGASTRODUODENOSCOPY (EGD) WITH PROPOFOL N/A 12/10/2020   Procedure: ESOPHAGOGASTRODUODENOSCOPY (EGD) WITH PROPOFOL;  Surgeon: Doran Stabler, MD;  Location: [facility];  Service: Gastroenterology;  Laterality: N/A;   INGUINAL HERNIA REPAIR Left 1980   INTRAVASCULAR PRESSURE WIRE/FFR STUDY N/A 10/22/2018   Procedure: INTRAVASCULAR PRESSURE WIRE/FFR STUDY;  Surgeon: Troy Sine, MD;  Location: [place] [facility];  Service: Cardiovascular;  Laterality: N/A;   IR THORACENTESIS ASP PLEURAL SPACE W/IMG GUIDE  02/04/2019   IR THORACENTESIS ASP PLEURAL SPACE W/IMG GUIDE  02/24/2019   KNEE ARTHROSCOPY Left 2006   LEFT HEART CATH AND CORS/GRAFTS ANGIOGRAPHY N/A 10/18/2018   Procedure: LEFT HEART CATH AND CORS/GRAFTS ANGIOGRAPHY;  Surgeon: Lorretta Harp, MD;  Location: [place] [facility];  Service: Cardiovascular;  Laterality: N/A;   LEFT HEART CATH AND CORS/GRAFTS ANGIOGRAPHY N/A 10/21/2018   Procedure: LEFT HEART CATH AND CORS/GRAFTS ANGIOGRAPHY;  Surgeon: Troy Sine, MD;  Location: [place] CV LAB;  Service: Cardiovascular;  Laterality: N/A;   PENILE PROSTHESIS IMPLANT     PLEURAL BIOPSY Left 04/12/2019   Procedure: Pleural Biopsy;  Surgeon: Grace Isaac, MD;  Location: [place];  Service: Thoracic;  Laterality: Left;   PLEURAL EFFUSION DRAINAGE Left 04/12/2019   Procedure: Drainage Of Pleural Effusion;  Surgeon: Grace Isaac, MD;  Location: [place];  Service: Thoracic;  Laterality: Left;   RIGHT HEART CATHETERIZATION N/A 11/14/2013   Procedure: RIGHT HEART CATH;  Surgeon: Larey Dresser, MD;  Location: [facility] CATH LAB;  Service: Cardiovascular;  Laterality: N/A;   SHOULDER OPEN ROTATOR CUFF REPAIR Right 2003   TALC PLEURODESIS Left 04/12/2019   Procedure: Pietro Cassis;  Surgeon: Grace Isaac, MD;  Location: [place];  Service: Thoracic;  Laterality: Left;   TEE WITHOUT CARDIOVERSION N/A 03/24/2019   Procedure: TRANSESOPHAGEAL ECHOCARDIOGRAM (TEE);  Surgeon: Larey Dresser, MD;  Location: [facility];  Service: Cardiovascular;  Laterality: N/A;   TEE WITHOUT CARDIOVERSION  04/12/2019   Procedure: Transesophageal Echocardiogram (Tee);  Surgeon: Grace Isaac, MD;  Location: [facility] OR;  Service: Thoracic;;   THORACOSCOPY Left 04/12/2019   VIDEO BRONCHOSCOPY (N/A ) VIDEO ASSISTED THORACOSCOPY (Left Chest) TALC PLEURADESIS (Left    TRANSCAROTID ARTERY REVASCULARIZATION  Right 09/13/2020   Procedure: RIGHT TRANSCAROTID ARTERY REVASCULARIZATION USING 52mm X 32mm ENROUTE TRANSCAROTID STEND SYSTEM;  Surgeon: Serafina Mitchell, MD;  Location: [place];  Service: Vascular;  Laterality: Right;   TRANSCAROTID ARTERY REVASCULARIZATION  Left 02/08/2021   Procedure: LEFT TRANSCAROTID ARTERY REVASCULARIZATION;  Surgeon: Serafina Mitchell, MD;  Location: [facility] OR;  Service: Vascular;  Laterality: Left;   ULTRASOUND GUIDANCE FOR VASCULAR ACCESS  09/13/2020   Procedure: ULTRASOUND GUIDANCE FOR VASCULAR ACCESS;  Surgeon: Serafina Mitchell, MD;  Location: [facility] OR;  Service: Vascular;;   VIDEO ASSISTED THORACOSCOPY Left 04/12/2019   Procedure: VIDEO ASSISTED THORACOSCOPY;  Surgeon: Grace Isaac, MD;  Location: [place];  Service: Thoracic;  Laterality: Left;   VIDEO BRONCHOSCOPY N/A 04/12/2019   Procedure: VIDEO BRONCHOSCOPY;  Surgeon: Grace Isaac, MD;  Location: [facility] OR;  Service: Thoracic;  Laterality: N/A;     Family History  Problem Relation Age of Onset   Coronary artery disease Mother    Hypertension Mother    Heart disease Mother      Social History   Socioeconomic History   Marital status: Married    Spouse name: Not on file   Number of children: 2   Years of education: Not on file   Highest education level: Not on file  Occupational History   Occupation: Scientist, clinical (histocompatibility and immunogenetics): [employer]  Tobacco Use   Smoking status: Former    Packs/day: 1.00    Years: 35.00    Pack years: 35.00    Types: Cigarettes    Quit date: 02/18/1984    Years since quitting: 37.7    Smokeless tobacco: Never  Vaping Use   Vaping Use: Never used  Substance and Sexual Activity   Alcohol use: Yes    Alcohol/week: 2.0 standard drinks    Types: 2 Standard drinks or equivalent per week   Drug use: Never   Sexual activity: Not on file  Other Topics Concern   Not on file  Social History Narrative   Not on file   Social Determinants of Health   Financial Resource Strain: Not on file  Food Insecurity: Not on file  Transportation Needs: Not on file  Physical Activity: Not on file  Stress: Not on file  Social Connections: Not on file  Intimate Partner Violence: Not on file     BP 118/76    Pulse 60    Ht 6' (1.829 m)    Wt 209 lb 9.6 oz (95.1 kg)    SpO2 97%    BMI 28.43 kg/m   Physical Exam:  Well appearing elderly man, NAD HEENT: Unremarkable Neck:  No JVD, no thyromegally Lymphatics:  No adenopathy Back:  No CVA tenderness Lungs:  Clear with no wheezes HEART:  Regular rate rhythm, no murmurs, no rubs, no clicks Abd:  soft, positive bowel sounds, no organomegally, no rebound, no guarding Ext:  2 plus pulses, no edema, no cyanosis, no clubbing Skin:  No rashes no nodules Neuro:  CN II through XII intact, motor grossly intact   Assess/Plan:  Atrial arrhythmias - we discussed the treatment options. He would require either sotalol or dofetilide in the hospital for 3 days. He does not think that his symptoms are currently severe enough for him to go to the hospital but if they worsen then  he would consider. His age and multiple arrhythmias make catheter ablation relatively contra-indicated.  CAD - he denies anginal symptoms.  Chronic systolic/diastolic heart failure - his symptoms are class 1. No change in meds. 4. Sinus node dysfunction - he appears to be asymptomatic. No indication for PPM at this point.  Carleene Overlie Brynlie Daza,MD

## 2021-11-13 ENCOUNTER — Other Ambulatory Visit (HOSPITAL_COMMUNITY): Payer: Medicare Other

## 2021-11-13 ENCOUNTER — Encounter (HOSPITAL_COMMUNITY): Payer: Medicare Other | Admitting: Cardiology

## 2021-11-15 ENCOUNTER — Ambulatory Visit (HOSPITAL_COMMUNITY)
Admission: RE | Admit: 2021-11-15 | Discharge: 2021-11-15 | Disposition: A | Discharge status: Home / Self Care | Payer: Medicare Other | Source: Ambulatory Visit | Attending: Cardiology | Admitting: Cardiology

## 2021-11-15 ENCOUNTER — Other Ambulatory Visit: Payer: Self-pay

## 2021-11-15 ENCOUNTER — Ambulatory Visit (HOSPITAL_BASED_OUTPATIENT_CLINIC_OR_DEPARTMENT_OTHER)
Admission: RE | Admit: 2021-11-15 | Discharge: 2021-11-15 | Disposition: A | Discharge status: Home / Self Care | Payer: Medicare Other | Source: Ambulatory Visit | Attending: Cardiology | Admitting: Cardiology

## 2021-11-15 VITALS — BP 138/80 | HR 56 | Wt 207.2 lb

## 2021-11-15 DIAGNOSIS — I700 Atherosclerosis of aorta: Secondary | ICD-10-CM | POA: Diagnosis not present

## 2021-11-15 DIAGNOSIS — Z79899 Other long term (current) drug therapy: Secondary | ICD-10-CM | POA: Insufficient documentation

## 2021-11-15 DIAGNOSIS — I5032 Chronic diastolic (congestive) heart failure: Secondary | ICD-10-CM

## 2021-11-15 DIAGNOSIS — R946 Abnormal results of thyroid function studies: Secondary | ICD-10-CM | POA: Diagnosis not present

## 2021-11-15 DIAGNOSIS — I2510 Atherosclerotic heart disease of native coronary artery without angina pectoris: Secondary | ICD-10-CM | POA: Diagnosis not present

## 2021-11-15 DIAGNOSIS — I4892 Unspecified atrial flutter: Secondary | ICD-10-CM | POA: Diagnosis not present

## 2021-11-15 DIAGNOSIS — I5042 Chronic combined systolic (congestive) and diastolic (congestive) heart failure: Secondary | ICD-10-CM | POA: Insufficient documentation

## 2021-11-15 DIAGNOSIS — I480 Paroxysmal atrial fibrillation: Secondary | ICD-10-CM | POA: Diagnosis not present

## 2021-11-15 DIAGNOSIS — G4730 Sleep apnea, unspecified: Secondary | ICD-10-CM | POA: Insufficient documentation

## 2021-11-15 DIAGNOSIS — Z7901 Long term (current) use of anticoagulants: Secondary | ICD-10-CM | POA: Insufficient documentation

## 2021-11-15 DIAGNOSIS — E785 Hyperlipidemia, unspecified: Secondary | ICD-10-CM | POA: Insufficient documentation

## 2021-11-15 DIAGNOSIS — J984 Other disorders of lung: Secondary | ICD-10-CM | POA: Diagnosis not present

## 2021-11-15 DIAGNOSIS — J90 Pleural effusion, not elsewhere classified: Secondary | ICD-10-CM | POA: Diagnosis not present

## 2021-11-15 DIAGNOSIS — J9383 Other pneumothorax: Secondary | ICD-10-CM | POA: Insufficient documentation

## 2021-11-15 DIAGNOSIS — I110 Hypertensive heart disease with heart failure: Secondary | ICD-10-CM | POA: Diagnosis not present

## 2021-11-15 DIAGNOSIS — I252 Old myocardial infarction: Secondary | ICD-10-CM | POA: Insufficient documentation

## 2021-11-15 DIAGNOSIS — I714 Abdominal aortic aneurysm, without rupture, unspecified: Secondary | ICD-10-CM | POA: Insufficient documentation

## 2021-11-15 DIAGNOSIS — I080 Rheumatic disorders of both mitral and aortic valves: Secondary | ICD-10-CM | POA: Insufficient documentation

## 2021-11-15 DIAGNOSIS — Z951 Presence of aortocoronary bypass graft: Secondary | ICD-10-CM | POA: Diagnosis not present

## 2021-11-15 DIAGNOSIS — I6523 Occlusion and stenosis of bilateral carotid arteries: Secondary | ICD-10-CM | POA: Diagnosis not present

## 2021-11-15 LAB — CBC
HCT: 42.7 % (ref 39.0–52.0)
Hemoglobin: 13.6 g/dL (ref 13.0–17.0)
MCH: 30.7 pg (ref 26.0–34.0)
MCHC: 31.9 g/dL (ref 30.0–36.0)
MCV: 96.4 fL (ref 80.0–100.0)
Platelets: 152 10*3/uL (ref 150–400)
RBC: 4.43 MIL/uL (ref 4.22–5.81)
RDW: 13.8 % (ref 11.5–15.5)
WBC: 7.3 10*3/uL (ref 4.0–10.5)
nRBC: 0 % (ref 0.0–0.2)

## 2021-11-15 LAB — COMPREHENSIVE METABOLIC PANEL
ALT: 14 U/L (ref 0–44)
AST: 21 U/L (ref 15–41)
Albumin: 3.8 g/dL (ref 3.5–5.0)
Alkaline Phosphatase: 75 U/L (ref 38–126)
Anion gap: 6 (ref 5–15)
BUN: 25 mg/dL — ABNORMAL HIGH (ref 8–23)
CO2: 28 mmol/L (ref 22–32)
Calcium: 9.6 mg/dL (ref 8.9–10.3)
Chloride: 105 mmol/L (ref 98–111)
Creatinine, Ser: 1.22 mg/dL (ref 0.61–1.24)
GFR, Estimated: 57 mL/min — ABNORMAL LOW (ref >60)
Glucose, Bld: 102 mg/dL — ABNORMAL HIGH (ref 70–99)
Potassium: 4.7 mmol/L (ref 3.5–5.1)
Sodium: 139 mmol/L (ref 135–145)
Total Bilirubin: 0.6 mg/dL (ref 0.3–1.2)
Total Protein: 6.8 g/dL (ref 6.5–8.1)

## 2021-11-15 LAB — ECHOCARDIOGRAM COMPLETE
AR max vel: 1.14 cm2
AV Area VTI: 1 cm2
AV Area mean vel: 1.08 cm2
AV Mean grad: 15 mmHg
AV Peak grad: 26.8 mmHg
Ao pk vel: 2.59 m/s
Calc EF: 51.8 %
S' Lateral: 4.2 cm
Single Plane A2C EF: 53.8 %
Single Plane A4C EF: 51.2 %

## 2021-11-15 LAB — BRAIN NATRIURETIC PEPTIDE: B Natriuretic Peptide: 331.5 pg/mL — ABNORMAL HIGH (ref 0.0–100.0)

## 2021-11-15 MED ORDER — SPIRONOLACTONE 25 MG PO TABS: 25.0000 mg | ORAL_TABLET | Freq: Every day | ORAL | 3 refills | Status: DC

## 2021-11-15 NOTE — Patient Instructions (Signed)
Thank you for your visit today.  Increase Spironolactone to 25 mg daily  Labs done today, your results will be available in MyChart, we will contact you for abnormal readings.  How to Prepare for Your Myoview Test (stress test):  Nothing to eat or drink, except water, 4 hours prior to arrival time.  NO caffeine/decaffeinated products, or chocolate 12 hours prior to arrival. [place], please do not wear dresses.  Skirts or pants are approprate, please wear a short sleeve shirt. NO perfume, cologne or lotion Wear comfortable walking shoes.  NO HEELS! Total time is 3 to 4 hours; you may want to bring reading material for the waiting time. Please report to [facility] for your test  What to expect after you arrive:  Once you arrive and check in for your appointment an IV will be started in your arm.  Then the Technoligist will inject a small amount of radioactive tracer.  There will be a 1 hour waiting period after this injection.  A series of pictures will be taken of your heart following this waiting period.  You will be prepped for the stress portion of the test.  During the stress portion of your test you will either walk on a treadmill or receive a small, safe amount of radioactive tracer injected in your IV.  After the stress portion, there is a short rest period during which time your heart and blood pressure will be monitored.  After the short rest period the Technologist will begin your second set of pictures.  Your doctor will inform you of your test results within 7-10 business days.  In preparation for your appointment, medication and supplies will be purchased.  Appointment availability is limited, so if you need to cancel or reschedule please call the office at [phone] 24 hours in advance to avoid a cancellation fee of $100.00  IF [place], [place] TECHNOLOGIST.  Repeat blood work in 10 days  Your physician recommends that you schedule a  follow-up appointment in: 3 months  If you have any questions or concerns before your next appointment please send [place] a message through [place] or call our office at [phone].    TO LEAVE A MESSAGE FOR THE NURSE SELECT OPTION 2, PLEASE LEAVE A MESSAGE INCLUDING: YOUR NAME DATE OF BIRTH CALL BACK NUMBER REASON FOR CALL**this is important as we prioritize the call backs  YOU WILL RECEIVE A CALL BACK THE SAME DAY AS LONG AS YOU CALL BEFORE 4:00 PM  At the [facility], you and your health needs are our priority. As part of our continuing mission to provide you with exceptional heart care, we have created designated Provider Care Teams. These Care Teams include your primary Cardiologist (physician) and Advanced Practice Providers (APPs- Physician Assistants and Nurse Practitioners) who all work together to provide you with the care you need, when you need it.   You may see any of the following providers on your designated Care Team at your next follow up: Dr Glori Bickers Dr Haynes Kerns, NP Lyda Jester, [place] [facility] [place], [place] Audry Riles, PharmD   Please be sure to bring in all your medications bottles to every appointment.

## 2021-11-15 NOTE — Progress Notes (Signed)
°  Echocardiogram 2D Echocardiogram has been performed.  Eric Gordon 11/15/2021, 11:41 AM

## 2021-11-17 NOTE — Progress Notes (Signed)
HDate:  11/17/2021   ID:  [name], DOB Jun 26, 1931, MRN [id number]   Provider location: [facility] Type of Visit: Established patient  PCP:  Shon Baton, MD  Cardiologist:  Dr. Aundra Dubin   History of Present Illness: Eric Gordon is a 88 y.o. male with history of CAD s/p CABG.  He has had trouble long-term with exertional dyspnea.  Dyspnea triggered evaluation in 2012 leading to CABG but was not resolved by CABG.  PFTs in 3/14 showed only mild obstructive airways disease and V/Q scan showed no PE.  Echo in 9/14 showed normal LV systolic function with moderately dilated RV. [place] in 2/15 showed normal right and left heart filling pressures and normal PA pressure.  Finally, he had a CPX in 3/15 that showed normal capacity compared to age-matched sedentary norms.  He was noted to have chronotropic incompetence.  At a prior appointment, I took him off metoprolol given chronotropic incompetence noted on CPX.  Dyspnea improved significantly with weight loss.  He had Cardiolite in 8/18 with EF 52%, no ischemia or infarction.    He was admitted in 1/20 with NSTEMI.  LHC showed new occlusion of the branch of SVG-ramus and OM that touched down on OM.  There were also serial 70% stenoses in the mid/distal RCA.  There was not thought to be an interventional option and patient was treated medically. He was discharged home but presented a couple days later with recurrent chest pain and dyspnea.  Cath was repeated showing no change from prior.  This admission, he had FFR of the RCA which did not suggest hemodynamic significance.  Echo showed EF 55-60%, normal RV.  CTA did not show a PE. He was noted to be volume overloaded and was diuresed.  He was also noted to be in atrial fibrillation with RVR transiently.  ASA/Plavix was stopped and Eliquis was started. He was discharged to SNF.  Atrial fibrillation recurred and he was started on amiodarone with conversion back to NSR.    In 4/20, he was admitted with left spontaneous PTX requiring chest tube.  After this, he developed recurrent left pleural effusions requiring thoracentesis x 3 so far.  Pleural fluid was exudative with eosinophil predominance.  With elevated ESR, there was concern that amiodarone could be involved, so this medication was stopped.  CT chest in 6/20 showed LLL consolidation/collapse with small left effusion, there was a rim-enhancing lesion posterior to the heart between esophagus and left atrium, ?loculated fluid.  TEE was done in 7/20 and confirmed loculated pleural effusion behind the left atrium.  In 7/20, patient had VATS on the left.   Carotid dopplers in 9/21 showed 80-99% RICA stenosis, 62-26% LICA.  AAA [place] in 9/21 showed 4.5 cm AAA. Patient was referred to Dr. Trula Slade for evaluation => he has had TCAR on right in 12/21.  He had left TCAR in 5/22.   Cardiolite in 12/21 with EF 41%, no ischemia, fixed inferolateral defect. Echo in 1/22 showed EF 40-45%, basal to mid inferolateral AK, basal inferior HK, normal RV, mild aortic stenosis mean gradient 11 mmHg.  He was unable to tolerate dapagliflozin and is off it. He was lightheaded on low dose Entresto so this was stopped.   Zio monitor in 1/23 showed 25% atrial flutter.  He was referred to EP, by the time he saw EP, burden of atrial fibrillation was lower.   Echo was done today, and reviewed, EF 45-50%, moderate LVH, normal  RV, mild AS mean gradient 15 mmHg with IVC normal.    Patient returns for followup of dyspnea and CAD.   He has had atrial fibrillation rarely, had run of AF/AFL yesterday (saw on Apple Watch).  This is the first symptomatic episode in 6 wks.  He notes palpitations when in atrial fibrillation. No orthopnea/PND.  Dyspnea only if he walks fast.  Weight is stable.  He has been having constant left lower chest pain, relatively mild.     Labs (2/15): K 4.2, creatinine 1.2, LDL 87, HDL 39, BNP 71 Labs (4/15): K 3.9, creatinine  1.1 Labs (12/15): LDL 73, HDL 37, K 3.9, creatinine 1.0 Labs (12/16): LDL 71, HDL 49 Labs (3/17): K 4.2, creatinine 1.28, HCT 45 Labs (8/18): LDL 82, HDL 41, TSH normal, hgb 16.3, K 4.1, creatinine 1.05 Labs (2/20): LFTs normal, pro BNP 842 Labs (3/20): K 3.8, creatinine 1.48, TSH mildly elevated at 6.97, free T3 and free T4 normal Labs (4/20): K 3.9, creatinine 1.36 Labs (2/20): LDL 52 Labs (8/20): BNP 241, creatinine 1.21  Labs (9/20): BNP 244, K 4, creatinine 1.12 Labs (2/21): K 4.3, creatinine 1.26, BNP 271 Labs (6/21): K 4.2, creatinine 1.11, LDL 57 Labs (6/22): K 4.3, creatinine 1.16, LDL 49 Labs (8/22): K 4.3, creatinine 1.37 Labs (12/22): K 3.9, creatinine 1.18   PMH: 1. Essential tremor 2. CAD: s/p CABG in 3/12.   - Cardiolite (8/18): EF 52%, no ischemia/infarction.  - NSTEMI (1/20):  LHC showed new occlusion of the branch of SVG-ramus and OM that touched down on OM.  There were also serial 70% stenoses in the mid/distal RCA.  FFR of RCA was negative.  - Cardiolite (12/21): EF 41%, no ischemia, fixed inferolateral defect 3. Atrial fibrillation: Only noted post-op CABG.  4. PE: Post-op CABG in 2012.  5. AAA: 3.6 cm on [place] in 3/14.  3.6 cm on [place] in 3/15. 3.6 cm on [place] 8/16. 4.1 cm on [place] 4/17.  - AAA [place] (5/18): 4.1 cm AAA.  - AAA [place] (5/19): 4.1 cm AAA.  - AAA [place] (4/20): 4.1 cm AAA.  - AAA [place] (9/21): 4.5 cm AAA - AAA [place] (9/22): 4.5 cm AAA 6. OSA: On CPAP.  7. Carotid stenosis: Carotid dopplers (8/67) with 67-20% LICA stenosis.  Carotid dopplers (9/47) with 09-62% LICA stenosis. Carotid dopplers (3/16) with 60-79% RCIA stenosis, 83-66% LICA stenosis.  - Carotid dopplers (8/16) with 40-59% RICA stenosis, 29-47% LICA stenosis.  - Carotid dopplers (8/17) with 65-46% RICA, 50-35% LICA. - Carotid dopplers (9/18) with 46-56% RICA, 81-27% LICA.   - Carotid dopplers (9/19) with 51-70% RICA, 01-74% LICA.  - Carotid dopplers (9/20) with 60-79% BICA - Carotid dopplers (9/21): 94-49%  RICA, 67-59% LICA - Right TCAR 16/38, left TCAR 5/22.  8. Asthma 9. PNA x 2 10. Chronic diastolic CHF/dyspnea: Echo (9/14) with EF 60-65%, mild LVH, very mild AS with mean gradient 10 mmHg, RV moderately dilated.  PFTs (3/14) showed mild obstructive airways disease.  V/Q scan (3/14) with no PE.  ENT workup for upper respiratory causes was negative.  RHC (2/15) with mean RA 7, PA 32/12, mean PCWP 13, CI 3.18.  CPX (3/15) with peak VO2 16.4, VE/VCO2 33; normal when compared to age-matched sedentary normals; chronotropic incompetence was noted.  Dyspnea was improved with weight loss.  - Echo (8/17): EF 60-65%, mild AS.  - Echo (1/20): EF 55-60%, normal RV size and systolic function.  - Echo (6/20): EF 55%, mild LVH, mild AS, normal  RV size and systolic function, ?LA mass or mass impinging on posterior LA.  - TEE (7/20): EF 50-55%, mild LVH, basal inferolateral aneurysm, normal RV size/systolic function, loculated pleural fluid behind LA.  - Echo (1/22): EF 40-45%, basal to mid inferolateral AK, basal inferior HK, normal RV, mild aortic stenosis mean gradient 11 mmHg. - Echo (2/23): EF 45-50%, moderate LVH, normal RV, mild AS mean gradient 15 mmHg with IVC normal.  11. HTN 12. Aortic stenosis: Mild.  13. Ascending aortic aneurysm: CTA chest with 4.4 cm ascending aorta in 1/20.  - 4.2 cm ascending aorta on CT chest 6/20.  14. Atrial fibrillation: Paroxysmal.  15. CKD: Stage 3.  16. Left lung spontaneous PTX then recurrent left pleural effusion.  - Left VATS in 7/20.  17. Palpitations:  - Zio patch (6/20): NSR with 1 short NSVT run and few short SVT runs, no atrial fibrillation.  - Zio patch 2 wks (2/21): 6 short NSVT runs, 66 short SVT runs, rare PACs/PVCs.  61. COVID-19 infection 4/22.    Current Outpatient Medications  Medication Sig Dispense Refill   acetaminophen (TYLENOL) 500 MG tablet Take 1,000 mg by mouth every 6 (six) hours as needed for moderate pain or headache.     apixaban  (ELIQUIS) 5 MG TABS tablet Take 5 mg by mouth 2 (two) times daily.     Ascorbic Acid 500 MG CAPS Take 500 mg by mouth daily.     atorvastatin (LIPITOR) 80 MG tablet Take 1 tablet (80 mg total) by mouth daily. 30 tablet 6   carvedilol (COREG) 3.125 MG tablet Take 1 tablet (3.125 mg total) by mouth 2 (two) times daily. 60 tablet 11   cetirizine (ZYRTEC) 10 MG tablet Take 10 mg by mouth daily.     cholecalciferol (VITAMIN D) 1000 UNITS tablet Take 1,000 Units by mouth every evening.     furosemide (LASIX) 40 MG tablet Take 0.5 tablets (20 mg total) by mouth every other day. 20 tablet 3   isosorbide mononitrate (IMDUR) 30 MG 24 hr tablet Take 1 tablet (30 mg total) by mouth daily.     levothyroxine (SYNTHROID, LEVOTHROID) 50 MCG tablet Take 50 mcg by mouth daily before breakfast.     Multiple Vitamins-Minerals (CENTRUM SILVER PO) Take 1 tablet by mouth daily.      pantoprazole (PROTONIX) 40 MG tablet Take 40 mg by mouth daily.      tamsulosin (FLOMAX) 0.4 MG CAPS capsule Take 0.4 mg by mouth daily after supper.      traMADol (ULTRAM) 50 MG tablet Take 1 tablet (50 mg total) by mouth every 6 (six) hours as needed for moderate pain. 20 tablet 0   valsartan (DIOVAN) 40 MG tablet Take 0.5 tablets (20 mg total) by mouth 2 (two) times daily. 30 tablet 3   spironolactone (ALDACTONE) 25 MG tablet Take 1 tablet (25 mg total) by mouth daily. 90 tablet 3   No current facility-administered medications for this encounter.    Allergies:   Ancef [cefazolin sodium] and Vancomycin   Social History:  The patient  reports that he quit smoking about 37 years ago. His smoking use included cigarettes. He has a 35.00 pack-year smoking history. He has never used smokeless tobacco. He reports current alcohol use of about 2.0 standard drinks per week. He reports that he does not use drugs.   Family History:  The patient's family history includes Coronary artery disease in his mother; Heart disease in his mother;  Hypertension in his mother.  ROS:  Please see the history of present illness.   All other systems are personally reviewed and negative.   Exam:   BP 138/80    Pulse (!) 56    Wt 94 kg (207 lb 3.2 oz)    SpO2 96%    BMI 28.10 kg/m  General: NAD Neck: No JVD, no thyromegaly or thyroid nodule.  Lungs: Clear to auscultation bilaterally with normal respiratory effort. CV: Nondisplaced PMI.  Heart regular S1/S2, no S3/S4, 2/6 early SEM RUSB.  No peripheral edema.  No carotid bruit.  Normal pedal pulses.  Abdomen: Soft, nontender, no hepatosplenomegaly, no distention.  Skin: Intact without lesions or rashes.  Neurologic: Alert and oriented x 3.  Psych: Normal affect. Extremities: No clubbing or cyanosis.  HEENT: Normal.   Recent Labs: 11/15/2021: ALT 14; B Natriuretic Peptide 331.5; BUN 25; Creatinine, Ser 1.22; Hemoglobin 13.6; Platelets 152; Potassium 4.7; Sodium 139  Personally reviewed   Wt Readings from Last 3 Encounters:  11/15/21 94 kg (207 lb 3.2 oz)  11/05/21 95.1 kg (209 lb 9.6 oz)  08/12/21 94.3 kg (207 lb 12.8 oz)      ASSESSMENT AND PLAN:  1. CAD: s/p CABG 3/12. NSTEMI 1/20, LHC showed new occlusion of the branch of SVG-ramus and OM that touched down on OM.  There were also serial 70% stenoses in the mid/distal RCA.  FFR of RCA was negative.  No interventional target.  Cardiolite in 12/21 showed inferolateral infarction with no ischemia, consistent with findings on NSTEMI in 1/20.  He is having more chest pain, but it is atypical.  - I will arrange for repeat Cardiolite.     - No ASA given Eliquis use.  - Continue Imdur 30 mg daily.  - Continue atorvastatin 80 daily.   - Off ranolazine with prolonged QTc.  2. Hyperlipidemia: Good lipids 6/22.  - On atorvastatin 80 daily 3. Chronic HF with mid range EF: TEE in 7/20 showed EF 50-55%, basal inferolateral aneurysm, and normal RV.  Echo in 1/22 with EF 40-45%, basal-mid inferolateral akinesis and inferior hypokinesis, normal  RV.  Echo was done today and reviewed, EF 45-50%, moderate LVH, normal RV, mild AS mean gradient 15 mmHg with IVC normal. He is not volume overloaded on exam.  NYHA class II, I think that his dyspnea is primarily related to his left lung disease (mild and stable).  He was unable to tolerate dapagliflozin and became lightheaded/hypotensive with Entresto.  - Continue valsartan 20 mg bid.  - Continue Coreg 3.125 mg bid.  - Increase spironolactone to 25 mg daily, BMET/BNP today and BMET in 10 days.  4. Spontaneous left PTX with recurrent left-sided pleural effusion:  He is s/p left VATS/pleurodesis in 7/20.    5. Carotid stenosis: S/p bilateral TCARs, followed at [facility].     6. AAA: 4.5 cm AAA in 9/22, repeat in 4/22.      7. Aortic stenosis: Mild on today's echo.   8. Atrial fibrillation: Paroxysmal. He is significantly symptomatic while in atrial fibrillation.  He is in NSR today.  He is off amiodarone due to concern for pulmonary toxicity.  He saw EP, did not recommend ablation. He would likely be a Tikosyn or sotalol candidate but would have to come into the hospital for it.  As he has had minimal atrial fibrillation recently, hold off Tikosyn/sotalol for now.  - Continue Eliquis 5 mg bid.   Recommended follow-up:  3 months  Signed, [name], MD  11/17/2021  [facility]  [address] [address] [place] [address] [phone] (office) [phone] (fax)

## 2021-11-19 DIAGNOSIS — I480 Paroxysmal atrial fibrillation: Secondary | ICD-10-CM | POA: Diagnosis not present

## 2021-11-19 DIAGNOSIS — I130 Hypertensive heart and chronic kidney disease with heart failure and stage 1 through stage 4 chronic kidney disease, or unspecified chronic kidney disease: Secondary | ICD-10-CM | POA: Diagnosis not present

## 2021-11-19 DIAGNOSIS — I10 Essential (primary) hypertension: Secondary | ICD-10-CM | POA: Diagnosis not present

## 2021-11-19 DIAGNOSIS — N1831 Chronic kidney disease, stage 3a: Secondary | ICD-10-CM | POA: Diagnosis not present

## 2021-11-20 ENCOUNTER — Telehealth (HOSPITAL_COMMUNITY): Payer: Self-pay

## 2021-11-20 NOTE — Telephone Encounter (Signed)
Detailed instructions left on the patient's answering machine. Asked to call back with any questions. S.Teejay Meader EMTP 

## 2021-11-21 ENCOUNTER — Other Ambulatory Visit: Payer: Self-pay

## 2021-11-21 ENCOUNTER — Ambulatory Visit (HOSPITAL_COMMUNITY): Payer: Medicare Other | Attending: Cardiology

## 2021-11-21 DIAGNOSIS — I5032 Chronic diastolic (congestive) heart failure: Secondary | ICD-10-CM | POA: Insufficient documentation

## 2021-11-21 LAB — MYOCARDIAL PERFUSION IMAGING
Estimated workload: 5.4
Exercise duration (min): 3 min
Exercise duration (sec): 50 s
LV dias vol: 177 mL (ref 62–150)
LV sys vol: 111 mL
MPHR: 132 {beats}/min
Nuc Stress EF: 37 %
Peak HR: 155 {beats}/min
Percent HR: 117 %
RPE: 19
Rest HR: 54 {beats}/min
Rest Nuclear Isotope Dose: 10.2 mCi
SDS: 6
SRS: 15
SSS: 19
Stress Nuclear Isotope Dose: 31.5 mCi
TID: 0.91

## 2021-11-21 MED ORDER — TECHNETIUM TC 99M TETROFOSMIN IV KIT
10.2000 | PACK | Freq: Once | INTRAVENOUS | Status: AC | PRN
  Administered 2021-11-21: 10.2 via INTRAVENOUS
  Filled 2021-11-21: qty 11

## 2021-11-21 MED ORDER — REGADENOSON 0.4 MG/5ML IV SOLN
0.4000 mg | Freq: Once | INTRAVENOUS | Status: AC
  Administered 2021-11-21: 0.4 mg via INTRAVENOUS

## 2021-11-21 MED ORDER — TECHNETIUM TC 99M TETROFOSMIN IV KIT
31.5000 | PACK | Freq: Once | INTRAVENOUS | Status: AC | PRN
  Administered 2021-11-21: 31.5 via INTRAVENOUS
  Filled 2021-11-21: qty 32

## 2021-11-25 ENCOUNTER — Other Ambulatory Visit: Payer: Self-pay

## 2021-11-25 ENCOUNTER — Institutional Professional Consult (permissible substitution): Payer: Medicare Other | Admitting: Internal Medicine

## 2021-11-25 ENCOUNTER — Ambulatory Visit (HOSPITAL_COMMUNITY)
Admission: RE | Admit: 2021-11-25 | Discharge: 2021-11-25 | Disposition: A | Discharge status: Home / Self Care | Payer: Medicare Other | Source: Ambulatory Visit | Attending: Cardiology | Admitting: Cardiology

## 2021-11-25 DIAGNOSIS — I5032 Chronic diastolic (congestive) heart failure: Secondary | ICD-10-CM | POA: Diagnosis not present

## 2021-11-25 LAB — BASIC METABOLIC PANEL
Anion gap: 5 (ref 5–15)
BUN: 19 mg/dL (ref 8–23)
CO2: 29 mmol/L (ref 22–32)
Calcium: 9.4 mg/dL (ref 8.9–10.3)
Chloride: 104 mmol/L (ref 98–111)
Creatinine, Ser: 1.22 mg/dL (ref 0.61–1.24)
GFR, Estimated: 57 mL/min — ABNORMAL LOW (ref >60)
Glucose, Bld: 98 mg/dL (ref 70–99)
Potassium: 4.6 mmol/L (ref 3.5–5.1)
Sodium: 138 mmol/L (ref 135–145)

## 2021-12-31 ENCOUNTER — Encounter (HOSPITAL_COMMUNITY): Payer: Self-pay | Admitting: Emergency Medicine

## 2021-12-31 ENCOUNTER — Emergency Department (HOSPITAL_COMMUNITY): Payer: Medicare Other

## 2021-12-31 ENCOUNTER — Observation Stay (HOSPITAL_COMMUNITY)
Admission: EM | Admit: 2021-12-31 | Discharge: 2022-01-01 | Disposition: A | Discharge status: Home / Self Care | Payer: Medicare Other | Attending: Internal Medicine | Admitting: Internal Medicine

## 2021-12-31 ENCOUNTER — Other Ambulatory Visit: Payer: Self-pay

## 2021-12-31 DIAGNOSIS — I110 Hypertensive heart disease with heart failure: Secondary | ICD-10-CM | POA: Insufficient documentation

## 2021-12-31 DIAGNOSIS — I480 Paroxysmal atrial fibrillation: Secondary | ICD-10-CM | POA: Diagnosis not present

## 2021-12-31 DIAGNOSIS — R0602 Shortness of breath: Secondary | ICD-10-CM | POA: Diagnosis not present

## 2021-12-31 DIAGNOSIS — Z79899 Other long term (current) drug therapy: Secondary | ICD-10-CM | POA: Insufficient documentation

## 2021-12-31 DIAGNOSIS — I729 Aneurysm of unspecified site: Secondary | ICD-10-CM | POA: Diagnosis not present

## 2021-12-31 DIAGNOSIS — Z20822 Contact with and (suspected) exposure to covid-19: Secondary | ICD-10-CM | POA: Diagnosis not present

## 2021-12-31 DIAGNOSIS — Z87891 Personal history of nicotine dependence: Secondary | ICD-10-CM | POA: Diagnosis not present

## 2021-12-31 DIAGNOSIS — I2510 Atherosclerotic heart disease of native coronary artery without angina pectoris: Secondary | ICD-10-CM | POA: Diagnosis not present

## 2021-12-31 DIAGNOSIS — R0600 Dyspnea, unspecified: Secondary | ICD-10-CM | POA: Diagnosis present

## 2021-12-31 DIAGNOSIS — R059 Cough, unspecified: Secondary | ICD-10-CM | POA: Diagnosis not present

## 2021-12-31 DIAGNOSIS — R079 Chest pain, unspecified: Secondary | ICD-10-CM | POA: Diagnosis not present

## 2021-12-31 DIAGNOSIS — I253 Aneurysm of heart: Principal | ICD-10-CM | POA: Diagnosis present

## 2021-12-31 DIAGNOSIS — J90 Pleural effusion, not elsewhere classified: Secondary | ICD-10-CM | POA: Diagnosis not present

## 2021-12-31 DIAGNOSIS — I5022 Chronic systolic (congestive) heart failure: Secondary | ICD-10-CM | POA: Diagnosis not present

## 2021-12-31 DIAGNOSIS — R0789 Other chest pain: Principal | ICD-10-CM

## 2021-12-31 DIAGNOSIS — R051 Acute cough: Secondary | ICD-10-CM | POA: Diagnosis present

## 2021-12-31 LAB — CBC WITH DIFFERENTIAL/PLATELET
Abs Immature Granulocytes: 0.03 10*3/uL (ref 0.00–0.07)
Basophils Absolute: 0 10*3/uL (ref 0.0–0.1)
Basophils Relative: 0 %
Eosinophils Absolute: 0.4 10*3/uL (ref 0.0–0.5)
Eosinophils Relative: 4 %
HCT: 43.5 % (ref 39.0–52.0)
Hemoglobin: 13.8 g/dL (ref 13.0–17.0)
Immature Granulocytes: 0 %
Lymphocytes Relative: 15 %
Lymphs Abs: 1.2 10*3/uL (ref 0.7–4.0)
MCH: 31 pg (ref 26.0–34.0)
MCHC: 31.7 g/dL (ref 30.0–36.0)
MCV: 97.8 fL (ref 80.0–100.0)
Monocytes Absolute: 0.7 10*3/uL (ref 0.1–1.0)
Monocytes Relative: 9 %
Neutro Abs: 5.7 10*3/uL (ref 1.7–7.7)
Neutrophils Relative %: 72 %
Platelets: 136 10*3/uL — ABNORMAL LOW (ref 150–400)
RBC: 4.45 MIL/uL (ref 4.22–5.81)
RDW: 14.5 % (ref 11.5–15.5)
WBC: 8.1 10*3/uL (ref 4.0–10.5)
nRBC: 0 % (ref 0.0–0.2)

## 2021-12-31 LAB — COMPREHENSIVE METABOLIC PANEL
ALT: 17 U/L (ref 0–44)
AST: 26 U/L (ref 15–41)
Albumin: 3.6 g/dL (ref 3.5–5.0)
Alkaline Phosphatase: 84 U/L (ref 38–126)
Anion gap: 8 (ref 5–15)
BUN: 25 mg/dL — ABNORMAL HIGH (ref 8–23)
CO2: 28 mmol/L (ref 22–32)
Calcium: 9.5 mg/dL (ref 8.9–10.3)
Chloride: 103 mmol/L (ref 98–111)
Creatinine, Ser: 1.36 mg/dL — ABNORMAL HIGH (ref 0.61–1.24)
GFR, Estimated: 50 mL/min — ABNORMAL LOW (ref >60)
Glucose, Bld: 113 mg/dL — ABNORMAL HIGH (ref 70–99)
Potassium: 3.6 mmol/L (ref 3.5–5.1)
Sodium: 139 mmol/L (ref 135–145)
Total Bilirubin: 1.4 mg/dL — ABNORMAL HIGH (ref 0.3–1.2)
Total Protein: 7.1 g/dL (ref 6.5–8.1)

## 2021-12-31 LAB — I-STAT ARTERIAL BLOOD GAS, ED
Acid-Base Excess: 1 mmol/L (ref 0.0–2.0)
Bicarbonate: 26.2 mmol/L (ref 20.0–28.0)
Calcium, Ion: 1.28 mmol/L (ref 1.15–1.40)
HCT: 38 % — ABNORMAL LOW (ref 39.0–52.0)
Hemoglobin: 12.9 g/dL — ABNORMAL LOW (ref 13.0–17.0)
O2 Saturation: 94 %
Patient temperature: 97.5
Potassium: 3.8 mmol/L (ref 3.5–5.1)
Sodium: 139 mmol/L (ref 135–145)
TCO2: 27 mmol/L (ref 22–32)
pCO2 arterial: 40 mmHg (ref 32–48)
pH, Arterial: 7.422 (ref 7.35–7.45)
pO2, Arterial: 66 mmHg — ABNORMAL LOW (ref 83–108)

## 2021-12-31 LAB — LACTIC ACID, PLASMA
Lactic Acid, Venous: 2.4 mmol/L (ref 0.5–1.9)
Lactic Acid, Venous: 1 mmol/L (ref 0.5–1.9)

## 2021-12-31 LAB — TROPONIN I (HIGH SENSITIVITY)
Troponin I (High Sensitivity): 11 ng/L (ref <18)
Troponin I (High Sensitivity): 12 ng/L (ref <18)

## 2021-12-31 LAB — RESP PANEL BY RT-PCR (FLU A&B, COVID) ARPGX2
Influenza A by PCR: NEGATIVE
Influenza B by PCR: NEGATIVE
SARS Coronavirus 2 by RT PCR: NEGATIVE

## 2021-12-31 LAB — BRAIN NATRIURETIC PEPTIDE: B Natriuretic Peptide: 341.1 pg/mL — ABNORMAL HIGH (ref 0.0–100.0)

## 2021-12-31 MED ORDER — MELATONIN 3 MG PO TABS
3.0000 mg | ORAL_TABLET | Freq: Every day | ORAL | Status: DC
  Administered 2021-12-31: 3 mg via ORAL
  Filled 2021-12-31: qty 1

## 2021-12-31 MED ORDER — PANTOPRAZOLE SODIUM 40 MG PO TBEC
40.0000 mg | DELAYED_RELEASE_TABLET | Freq: Every day | ORAL | Status: DC
  Administered 2022-01-01: 40 mg via ORAL
  Filled 2021-12-31: qty 1

## 2021-12-31 MED ORDER — FUROSEMIDE 20 MG PO TABS: 20.0000 mg | ORAL_TABLET | ORAL | Status: DC

## 2021-12-31 MED ORDER — ACETAMINOPHEN 500 MG PO TABS: 1000.0000 mg | ORAL_TABLET | Freq: Four times a day (QID) | ORAL | Status: DC | PRN

## 2021-12-31 MED ORDER — FUROSEMIDE 10 MG/ML IJ SOLN
20.0000 mg | Freq: Once | INTRAMUSCULAR | Status: AC
  Administered 2021-12-31: 20 mg via INTRAVENOUS
  Filled 2021-12-31: qty 2

## 2021-12-31 MED ORDER — ATORVASTATIN CALCIUM 80 MG PO TABS
80.0000 mg | ORAL_TABLET | Freq: Every day | ORAL | Status: DC
  Administered 2022-01-01: 80 mg via ORAL
  Filled 2021-12-31: qty 2

## 2021-12-31 MED ORDER — BENZONATATE 100 MG PO CAPS
100.0000 mg | ORAL_CAPSULE | Freq: Three times a day (TID) | ORAL | Status: DC
  Administered 2021-12-31 – 2022-01-01 (×3): 100 mg via ORAL
  Filled 2021-12-31 (×3): qty 1

## 2021-12-31 MED ORDER — IRBESARTAN 75 MG PO TABS
37.5000 mg | ORAL_TABLET | Freq: Every day | ORAL | Status: DC
  Administered 2022-01-01: 37.5 mg via ORAL
  Filled 2021-12-31 (×2): qty 0.5

## 2021-12-31 MED ORDER — SPIRONOLACTONE 25 MG PO TABS
25.0000 mg | ORAL_TABLET | Freq: Every evening | ORAL | Status: DC
  Administered 2021-12-31: 25 mg via ORAL
  Filled 2021-12-31 (×3): qty 1

## 2021-12-31 MED ORDER — IOHEXOL 350 MG/ML SOLN
65.0000 mL | Freq: Once | INTRAVENOUS | Status: AC | PRN
  Administered 2021-12-31: 65 mL via INTRAVENOUS

## 2021-12-31 MED ORDER — ISOSORBIDE MONONITRATE ER 30 MG PO TB24
30.0000 mg | ORAL_TABLET | Freq: Every day | ORAL | Status: DC
  Administered 2022-01-01: 30 mg via ORAL
  Filled 2021-12-31: qty 1

## 2021-12-31 MED ORDER — LACTATED RINGERS IV BOLUS
500.0000 mL | Freq: Once | INTRAVENOUS | Status: AC
  Administered 2021-12-31: 500 mL via INTRAVENOUS

## 2021-12-31 MED ORDER — SODIUM CHLORIDE 0.9% FLUSH: 3.0000 mL | INTRAVENOUS | Status: DC | PRN

## 2021-12-31 MED ORDER — TAMSULOSIN HCL 0.4 MG PO CAPS
0.4000 mg | ORAL_CAPSULE | Freq: Every day | ORAL | Status: DC
  Administered 2021-12-31: 0.4 mg via ORAL
  Filled 2021-12-31: qty 1

## 2021-12-31 MED ORDER — CARVEDILOL 3.125 MG PO TABS
3.1250 mg | ORAL_TABLET | Freq: Two times a day (BID) | ORAL | Status: DC
  Administered 2021-12-31 – 2022-01-01 (×2): 3.125 mg via ORAL
  Filled 2021-12-31 (×2): qty 1

## 2021-12-31 MED ORDER — ACETAMINOPHEN 325 MG PO TABS: 650.0000 mg | ORAL_TABLET | ORAL | Status: DC | PRN

## 2021-12-31 MED ORDER — LORATADINE 10 MG PO TABS
10.0000 mg | ORAL_TABLET | Freq: Every day | ORAL | Status: DC
  Administered 2022-01-01: 10 mg via ORAL
  Filled 2021-12-31: qty 1

## 2021-12-31 MED ORDER — TRAMADOL HCL 50 MG PO TABS: 50.0000 mg | ORAL_TABLET | Freq: Four times a day (QID) | ORAL | Status: DC | PRN

## 2021-12-31 MED ORDER — ONDANSETRON HCL 4 MG/2ML IJ SOLN
4.0000 mg | Freq: Four times a day (QID) | INTRAMUSCULAR | Status: DC | PRN
Start: 2021-12-31 — End: 2022-01-01

## 2021-12-31 MED ORDER — LEVOTHYROXINE SODIUM 50 MCG PO TABS
50.0000 ug | ORAL_TABLET | Freq: Every day | ORAL | Status: DC
  Administered 2022-01-01: 50 ug via ORAL
  Filled 2021-12-31: qty 2

## 2021-12-31 MED ORDER — SODIUM CHLORIDE 0.9% FLUSH
3.0000 mL | Freq: Two times a day (BID) | INTRAVENOUS | Status: DC
  Administered 2021-12-31 (×2): 3 mL via INTRAVENOUS

## 2021-12-31 MED ORDER — SODIUM CHLORIDE 0.9 % IV SOLN: 250.0000 mL | INTRAVENOUS | Status: DC | PRN

## 2021-12-31 NOTE — ED Notes (Signed)
Patient transported to CT 

## 2021-12-31 NOTE — ED Notes (Signed)
Documentation by Thurmond Butts RN in chart are in error and not related to this patient. ?

## 2021-12-31 NOTE — ED Provider Triage Note (Signed)
Emergency Medicine Provider Triage Evaluation Note ? ?Eric Gordon , a 88 y.o. male  was evaluated in triage.  Pt complains of chest pain, shortness of breath, and cough.  Symptoms have been present over the last 3 to 4 days.  Patient states that chest pain is located to the left side of his chest and has been constant.  Pain does not radiate.  Pain is described as a stabbing.  Cough is nonproductive.  Denies any known sick contacts. ? ?Review of Systems  ?Positive: Left-sided chest pain, shortness of breath, nonproductive cough, rhinorrhea ?Negative: Fever, chills, sore throat, palpitations, leg swelling or tenderness, weight gain, abdominal pain, nausea, vomiting, lightheadedness, syncope ? ?Physical Exam  ?BP (!) 116/51 (BP Location: Left Arm)   Pulse 65   Temp (!) 97.5 ?F (36.4 ?C) (Oral)   Resp 16   SpO2 95%  ?Gen:   Awake, no distress   ?Resp:  Normal effort, minimal expiratory wheezing noted to all lung fields.  Speaks in focally sentences without difficulty ?MSK:   Moves extremities without difficulty; no swelling to bilateral lower extremities ?Other:  Abdomen soft, nondistended, nontender with no guarding or rebound tenderness. ? ?Medical Decision Making  ?Medically screening exam initiated at 9:05 AM.  Appropriate orders placed.  Eric Gordon was informed that the remainder of the evaluation will be completed by another provider, this initial triage assessment does not replace that evaluation, and the importance of remaining in the ED until their evaluation is complete. ? ?Due to patient's medical history will initiate ACS work-up at this time.  Additionally will obtain chest x-ray and COVID-19 testing. ?  ?Loni Beckwith, PA-C ?12/31/21 3419 ? ?

## 2021-12-31 NOTE — ED Notes (Signed)
Received patient in no acute distress breathing spontaneously to room air . Patient denies complaints at this time .Continuing monitoring.  ?

## 2021-12-31 NOTE — Progress Notes (Signed)
?  Subjective: ?I discussed this patient with Dr. Wyatt Haste for coordination of care and specifically reviewed the images of his most recent CTA and echocardiogram.  Patient has a complex history which included coronary bypass grafting in 2012 followed by vein graft occlusions and non-STEMI which were not treated with intervention.  In 2020 the patient had left VATS for drainage of chronic loculated left pleural effusion and application of talc pleurodesis with good result.  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  The enhancement seen on today's imaging is due to the talc pleurodesis performed in the left hemithorax. ? ?The patient has moderate LV dysfunction and is being followed at the [facility].  The patient has no indications for surgery and at age 88 with multiple comorbidities he would not be a candidate for cardiac surgery in any event. ? ?Objective: ?Vital signs in last 24 hours: ?Temp:  [97.5 ?F (36.4 ?C)] 97.5 ?F (36.4 ?C) (04/11 1014) ?Pulse Rate:  [53-110] 54 (04/11 1630) ?Resp:  [16-31] 25 (04/11 1630) ?BP: (103-161)/(51-129) 121/64 (04/11 1630) ?SpO2:  [93 %-100 %] 94 % (04/11 1630) ? ?Hemodynamic parameters for last 24 hours: ?Stable ? ?Intake/Output from previous day: ?No intake/output data recorded. ?Intake/Output this shift: ?No intake/output data recorded. ? ? ? ?Lab Results: ?Recent Labs  ?  12/31/21 ?0902 12/31/21 ?1445  ?WBC 8.1  --   ?HGB 13.8 12.9*  ?HCT 43.5 38.0*  ?PLT 136*  --   ? ?BMET:  ?Recent Labs  ?  12/31/21 ?0902 12/31/21 ?1445  ?NA 139 139  ?K 3.6 3.8  ?CL 103  --   ?CO2 28  --   ?GLUCOSE 113*  --   ?BUN 25*  --   ?CREATININE 1.36*  --   ?CALCIUM 9.5  --   ?  ?PT/INR: No results for input(s): LABPROT, INR in the last 72 hours. ?ABG ?   ?Component Value Date/Time  ? PHART 7.422 12/31/2021 1445  ? HCO3 26.2 12/31/2021 1445  ? TCO2 27 12/31/2021 1445  ? ACIDBASEDEF 1.0  12/11/2010 1902  ? O2SAT 94 12/31/2021 1445  ? ?CBG (last 3)  ?No results for input(s): GLUCAP in the last 72 hours. ? ?Assessment/Plan: ?S/P   Left VATS for drainage of pleural effusion and talc pleurodesis 2020 ? ?Patients CT scan today shows a stable chronic LV pseudoaneurysm and surgery is not indicated. ? ? ? LOS: 0 days  ? ? ?Dahlia Byes ?12/31/2021 ?  ?

## 2021-12-31 NOTE — ED Triage Notes (Signed)
Patient complains of cough, shortness of breath, and chest pain that started four days ago. Afebrile in triage, patient is alert, oriented, speaking in complete sentences with room air spO2 of 95%. ?

## 2021-12-31 NOTE — Consult Note (Signed)
?Cardiology Consultation:  ? ?Patient ID: Eric Gordon ?MRN: [id number]; DOB: May 04, 1931 ? ?Admit date: 12/31/2021 ?Date of Consult: 12/31/2021 ? ?PCP:  Shon Baton, MD ?  ?[facility] Providers ?Cardiologist:  None  ?Advanced Heart Failure:  [name], MD  { ?Patient Profile:  ? ?Eric Gordon is a 88 y.o. male with a hx of CAD  who is being seen 12/31/2021 for the evaluation of possilbe rupture of LV pseudoaneurysm at the request of Dr Matilde Sprang. ? ?History of Present Illness:  ? ?Eric Gordon is a 88 y.o. male with history of CAD  Dyspnea triggered evaluation in 2012 leading to CABG but was not resolved by CABG.  PFTs in 3/14 showed only mild obstructive airways disease and V/Q scan showed no PE.   ?Echo in 9/14 showed normal LV systolic function with moderately dilated RV. [place] in 2/15 showed normal right and left heart filling pressures and normal PA pressure.   CPX in 11/2013 that showed normal capacity compared to age-matched sedentary norms.  He was noted to have chronotropic incompetence.  taken off metoprolol.    ?Pt had NSTEMI in Jan 2020  LHC showed new occlusion of the branch of SVG-ramus and OM that touched down on OM.  There were also serial 70% stenoses in the mid/distal RCA, not thought to be an interventional option so patient was treated medically. He was discharged but presented a couple days later with recurrent chest pain and dyspnea.  Repeat cath showed no change from prior.  This admission, he had FFR of the RCA which did not suggest hemodynamic significance.  Echo showed EF 55-60%, normal RV.  CTA did not show a PE  Pt diuresed  Note he was  noted to be in atrial fibrillation with RVR transiently.  ASA/Plavix was stopped and Eliquis was started. He was discharged to SNF.Atrial fibrillation recurred and he was started on amiodarone with conversion back to NSR.  ?  ?In 4/20, he was admitted with left spontaneous PTX requiring chest tube.  After this, he developed recurrent  left pleural effusions requiring thoracentesis x 3 so far.  Pleural fluid was exudative with eosinophil predominance.  With elevated ESR, there was concern that amiodarone could be involved, so this medication was stopped.  CT chest in 6/20 showed LLL consolidation/collapse with small left effusion, there was a rim-enhancing lesion posterior to the heart between esophagus and left atrium, ?loculated fluid.  TEE was done in 7/20 and confirmed loculated pleural effusion behind the left atrium.  In 03/2019, patient had VATS on the left.  ?  ?He has been followed by Einar Crow in [place] in 2021 showed fixed inferolateral defect   LVEF 41%   Echo showed LVEF 40 to 45% with base/mid inferolateral AK; basal inferior HL   Mild AS   He was unable to tolerate dapagliflozin and is off it. He was lightheaded on low dose Entresto so this was stopped.  ?  ?Zio monitor in 1/23 showed 25% atrial flutter.  He was referred to EP, by the time he saw EP, burden of atrial fibrillation was lower.  ?  ?He [place] in [place] in Feb 6063  Pt noted dyspnea wit hfast walking    Constant mld L lower chest pain  ? ?The pt presents to ED today with Hx of nonproductive cough, SOB for the past 3 to 4 days   Chest pain on L sided of chest  that is constant  Stabbing  ?  ? ? ?Past Medical History:  ?Diagnosis Date  ? Abdominal aortic aneurysm (AAA) ([place])   ? Achilles tendinitis of right lower extremity   ? Arthritis   ? "my whole body" (03/19/2018)  ? Atherosclerosis of coronary artery bypass graft w/o angina pectoris   ? Atrial fibrillation ([place])   ? Barrett's esophagus   ? CAD (coronary artery disease)   ? Carotid artery disease ([place])   ? CHF (congestive heart failure) ([place])   ? Chronic anticoagulation   ? PE  ? Chronic lower back pain   ? Dyspnea   ? Essential tremor   ? GERD (gastroesophageal reflux disease)   ? High cholesterol   ? Hyperlipidemia   ? Hyperplasia, prostate   ? Hypertension   ? Hypothyroidism   ? Myocardial infarction  [facility])   ? 2 in Jan. 2019  ? Nail dystrophy   ? OSA on CPAP   ? uses CPAP  ? Osteoarthrosis   ? Pneumonia   ? "now and once before" (03/19/2018)  ? Pulmonary embolism ([place])   ? April 2012 after CABG  ? S/P CABG (coronary artery bypass graft)   ? Sleep apnea   ? Spontaneous pneumothorax   ? left spontaneous pneumothorax/left pleural effusion, s/p chest tube 01/09/19; thoracentesis x2, s/p left VATS/tacl pleurodesis 04/12/19  ? ? ?Past Surgical History:  ?Procedure Laterality Date  ? ACHILLES TENDON REPAIR Bilateral   ? 2006 & 2004  ? CARDIAC CATHETERIZATION  11/2010  ? CORONARY ANGIOGRAPHY N/A 10/22/2018  ? Procedure: CORONARY ANGIOGRAPHY;  Surgeon: Troy Sine, MD;  Location: [facility] CV LAB;  Service: Cardiovascular;  Laterality: N/A;  ? CORONARY ARTERY BYPASS GRAFT  March 2012  ? CABG X 5  ? ESOPHAGOGASTRODUODENOSCOPY (EGD) WITH PROPOFOL N/A 12/10/2020  ? Procedure: ESOPHAGOGASTRODUODENOSCOPY (EGD) WITH PROPOFOL;  Surgeon: Doran Stabler, MD;  [facility]: [facility]OS[facility];  Service: Gastroenterology;  Laterality: N/A;  ? INGUINAL HERNIA REPAIR Left 1980  ? INTRAVASCULAR PRESSURE WIRE/FFR STUDY N/A 10/22/2018  ? Procedure: INTRAVASCULAR PRESSURE WIRE/FFR STUDY;  Surgeon: Troy Sine, MD;  Location: [facility];  Service: Cardiovascular;  Laterality: N/A;  ? IR THORACENTESIS ASP PLEURAL SPACE W/IMG GUIDE  02/04/2019  ? IR THORACENTESIS ASP PLEURAL SPACE W/IMG GUIDE  02/24/2019  ? KNEE ARTHROSCOPY Left 2006  ? LEFT HEART CATH AND CORS/GRAFTS ANGIOGRAPHY N/A 10/18/2018  ? Procedure: LEFT HEART CATH AND CORS/GRAFTS ANGIOGRAPHY;  Surgeon: Lorretta Harp, MD;  Location: [place] [facility];  Service: Cardiovascular;  Laterality: N/A;  ? LEFT HEART CATH AND CORS/GRAFTS ANGIOGRAPHY N/A 10/21/2018  ? Procedure: LEFT HEART CATH AND CORS/GRAFTS ANGIOGRAPHY;  Surgeon: Troy Sine, MD;  Location: [facility];  Service: Cardiovascular;  Laterality: N/A;  ? PENILE PROSTHESIS IMPLANT    ? PLEURAL BIOPSY Left  04/12/2019  ? Procedure: Pleural Biopsy;  Surgeon: Grace Isaac, MD;  Location: [place];  Service: Thoracic;  Laterality: Left;  ? PLEURAL EFFUSION DRAINAGE Left 04/12/2019  ? Procedure: Drainage Of Pleural Effusion;  Surgeon: Grace Isaac, MD;  Location: [facility];  Service: Thoracic;  Laterality: Left;  ? RIGHT HEART CATHETERIZATION N/A 11/14/2013  ? Procedure: RIGHT HEART CATH;  Surgeon: Larey Dresser, MD;  Location: [facility] CATH LAB;  Service: Cardiovascular;  Laterality: N/A;  ? SHOULDER OPEN ROTATOR CUFF REPAIR Right 2003  ? TALC PLEURODESIS Left 04/12/2019  ? Procedure: Pietro Cassis;  Surgeon: Grace Isaac, MD;  Location: [facility];  Service: Thoracic;  Laterality: Left;  ? TEE WITHOUT CARDIOVERSION N/A 03/24/2019  ? Procedure: TRANSESOPHAGEAL ECHOCARDIOGRAM (TEE);  Surgeon: Larey Dresser, MD;  Location: [facility] ENDOSCOPY;  Service: Cardiovascular;  Laterality: N/A;  ? TEE WITHOUT CARDIOVERSION  04/12/2019  ? Procedure: Transesophageal Echocardiogram (Tee);  Surgeon: Grace Isaac, MD;  Location: [facility];  Service: Thoracic;;  ? THORACOSCOPY Left 04/12/2019  ? VIDEO BRONCHOSCOPY (N/A ) VIDEO ASSISTED THORACOSCOPY (Left Chest) TALC PLEURADESIS (Left   ? TRANSCAROTID ARTERY REVASCULARIZATION?  Right 09/13/2020  ? Procedure: RIGHT TRANSCAROTID ARTERY REVASCULARIZATION USING 42m X 465mENROUTE TRANSCAROTID STEND SYSTEM;  Surgeon: BrSerafina MitchellMD;  [facility]: Vascular;  Laterality: Right;  ? TRANSCAROTID ARTERY REVASCULARIZATION?  Left 02/08/2021  ? Procedure: LEFT TRANSCAROTID ARTERY REVASCULARIZATION;  Surgeon: BrSerafina MitchellMD;  [facility];  Service: Vascular;  Laterality: Left;  ? ULTRASOUND GUIDANCE FOR VASCULAR ACCESS  09/13/2020  ? Procedure: ULTRASOUND GUIDANCE FOR VASCULAR ACCESS;  Surgeon: BrSerafina MitchellMD;  [facility];  Service: Vascular;;  ? VIDEO ASSISTED THORACOSCOPY Left 04/12/2019  ? Procedure: VIDEO ASSISTED THORACOSCOPY;  Surgeon: GeGrace IsaacMD;   [facility]: Thoracic;  Laterality: Left;  ? VIDEO BRONCHOSCOPY N/A 04/12/2019  ? Procedure: VIDEO BRONCHOSCOPY;  Surgeon: GeGrace IsaacMD;  [facility]: Thoracic;  Lateral

## 2021-12-31 NOTE — ED Provider Notes (Signed)
?[facility] ?Provider Note ? ?CSN: [id number] ?Arrival date & time: 12/31/21 0847 ? ?Chief Complaint(s) ?Chest Pain ? ?HPI ?Eric Gordon is a 88 y.o. male with PMH abdominal aortic aneurysm, A-fib on Eliquis, extensive cardiac history status post CABG, left spontaneous pneumothorax requiring a chest tube complicated by recurrent left-sided pleural effusions and VATS who presents the emergency department for evaluation of cough and shortness of breath.  Symptoms have been present for 4 days.  Endorses mild left-sided chest pain along the rib line.  Denies fever, abdominal pain, nausea, vomiting or other systemic symptoms.  Patient does endorse dyspnea on exertion that appears to have worsened over the last 4 days. ? ? ?Chest Pain ?Associated symptoms: cough and shortness of breath   ? ?Past Medical History ?Past Medical History:  ?Diagnosis Date  ? Abdominal aortic aneurysm (AAA) ([place])   ? Achilles tendinitis of right lower extremity   ? Arthritis   ? "my whole body" (03/19/2018)  ? Atherosclerosis of coronary artery bypass graft w/o angina pectoris   ? Atrial fibrillation ([place])   ? Barrett's esophagus   ? CAD (coronary artery disease)   ? Carotid artery disease ([place])   ? CHF (congestive heart failure) ([place])   ? Chronic anticoagulation   ? PE  ? Chronic lower back pain   ? Dyspnea   ? Essential tremor   ? GERD (gastroesophageal reflux disease)   ? High cholesterol   ? Hyperlipidemia   ? Hyperplasia, prostate   ? Hypertension   ? Hypothyroidism   ? Myocardial infarction [facility])   ? 2 in Jan. 2019  ? Nail dystrophy   ? OSA on CPAP   ? uses CPAP  ? Osteoarthrosis   ? Pneumonia   ? "now and once before" (03/19/2018)  ? Pulmonary embolism ([place])   ? April 2012 after CABG  ? S/P CABG (coronary artery bypass graft)   ? Sleep apnea   ? Spontaneous pneumothorax   ? left spontaneous pneumothorax/left pleural effusion, s/p chest tube 01/09/19; thoracentesis x2, s/p left VATS/tacl  pleurodesis 04/12/19  ? ?Patient Active Problem List  ? Diagnosis Date Noted  ? Carotid stenosis 02/08/2021  ? Status post carotid endarterectomy 09/13/2020  ? Carotid artery stenosis, symptomatic, right 09/13/2020  ? Tear of lateral meniscus of knee 05/04/2020  ? Osteoarthritis of left knee 04/05/2020  ? Pain in left knee 04/03/2020  ? Pleural effusion 04/12/2019  ? Pneumothorax on left 01/10/2019  ? Pneumothorax, left 01/09/2019  ? CKD (chronic kidney disease), stage III ([place]) 01/09/2019  ? Chronic diastolic CHF (congestive heart failure) ([place]) 01/09/2019  ? Hypothyroidism 01/09/2019  ? Pleural effusion on left 01/09/2019  ? NSTEMI (non-ST elevated myocardial infarction) ([place]) 10/21/2018  ? Acute diastolic CHF (congestive heart failure) ([place])   ? Non-ST elevation (NSTEMI) myocardial infarction ([place]) 10/17/2018  ? CAP (community acquired pneumonia) 03/19/2018  ? Hoarseness 12/15/2013  ? Mild aortic stenosis 11/03/2012  ? Atrial fibrillation ([place]) 04/02/2011  ? Carotid stenosis, asymptomatic 04/02/2011  ? CAD (coronary artery disease) 01/10/2011  ? Abdominal aortic aneurysm ([place]) 11/06/2010  ? CHEST TIGHTNESS-PRESSURE-OTHER 11/01/2010  ? Asthma, mild intermittent 04/10/2010  ? DYSPNEA 02/26/2010  ? OBSTRUCTIVE SLEEP APNEA 08/22/2008  ? Essential hypertension 08/22/2008  ? BARRETTS ESOPHAGUS 08/18/2008  ? HIATAL HERNIA 08/18/2008  ? DIVERTICULOSIS, COLON 08/18/2008  ? COLONIC POLYPS, HX OF 08/18/2008  ? ?Home Medication(s) ?Prior to Admission medications   ?Medication Sig Start Date End Date Taking? Authorizing Provider  ?  acetaminophen (TYLENOL) 500 MG tablet Take 1,000 mg by mouth every 6 (six) hours as needed for moderate pain or headache.    [provider]  ?apixaban (ELIQUIS) 5 MG TABS tablet Take 5 mg by mouth 2 (two) times daily.    [provider]  ?Ascorbic Acid 500 MG CAPS Take 500 mg by mouth daily.    [provider]  ?atorvastatin (LIPITOR) 80 MG tablet Take 1 tablet (80 mg  total) by mouth daily. 11/01/18   Larey Dresser, MD  ?carvedilol (COREG) 3.125 MG tablet Take 1 tablet (3.125 mg total) by mouth 2 (two) times daily. 03/21/19 11/15/21  Larey Dresser, MD  ?cetirizine (ZYRTEC) 10 MG tablet Take 10 mg by mouth daily.    [provider]  ?cholecalciferol (VITAMIN D) 1000 UNITS tablet Take 1,000 Units by mouth every evening.    [provider]  ?furosemide (LASIX) 40 MG tablet Take 0.5 tablets (20 mg total) by mouth every other day. 05/07/21   Larey Dresser, MD  ?isosorbide mononitrate (IMDUR) 30 MG 24 hr tablet Take 1 tablet (30 mg total) by mouth daily. 02/26/21   Larey Dresser, MD  ?levothyroxine (SYNTHROID, LEVOTHROID) 50 MCG tablet Take 50 mcg by mouth daily before breakfast.    [provider]  ?Multiple Vitamins-Minerals (CENTRUM SILVER PO) Take 1 tablet by mouth daily.     [provider]  ?pantoprazole (PROTONIX) 40 MG tablet Take 40 mg by mouth daily.     [provider]  ?spironolactone (ALDACTONE) 25 MG tablet Take 1 tablet (25 mg total) by mouth daily. 11/15/21   Larey Dresser, MD  ?tamsulosin (FLOMAX) 0.4 MG CAPS capsule Take 0.4 mg by mouth daily after supper.     [provider]  ?traMADol (ULTRAM) 50 MG tablet Take 1 tablet (50 mg total) by mouth every 6 (six) hours as needed for moderate pain. 02/09/21   Dagoberto Ligas, PA-C  ?valsartan (DIOVAN) 40 MG tablet Take 0.5 tablets (20 mg total) by mouth 2 (two) times daily. 05/28/21   Larey Dresser, MD  ?                                                                                                                                  ?Past Surgical History ?Past Surgical History:  ?Procedure Laterality Date  ? ACHILLES TENDON REPAIR Bilateral   ? 2006 & 2004  ? CARDIAC CATHETERIZATION  11/2010  ? CORONARY ANGIOGRAPHY N/A 10/22/2018  ? Procedure: CORONARY ANGIOGRAPHY;  Surgeon: Troy Sine, MD;  Location: [place] [facility] LAB;  Service: Cardiovascular;   Laterality: N/A;  ? CORONARY ARTERY BYPASS GRAFT  March 2012  ? CABG X 5  ? ESOPHAGOGASTRODUODENOSCOPY (EGD) WITH PROPOFOL N/A 12/10/2020  ? Procedure: ESOPHAGOGASTRODUODENOSCOPY (EGD) WITH PROPOFOL;  Surgeon: Doran Stabler, MD;  [facility];  Service: Gastroenterology;  Laterality: N/A;  ? INGUINAL HERNIA REPAIR Left  1980  ? INTRAVASCULAR PRESSURE WIRE/FFR STUDY N/A 10/22/2018  ? Procedure: INTRAVASCULAR PRESSURE WIRE/FFR STUDY;  Surgeon: Troy Sine, MD;  Location: [place] CV LAB;  Service: Cardiovascular;  Laterality: N/A;  ? IR THORACENTESIS ASP PLEURAL SPACE W/IMG GUIDE  02/04/2019  ? IR THORACENTESIS ASP PLEURAL SPACE W/IMG GUIDE  02/24/2019  ? KNEE ARTHROSCOPY Left 2006  ? LEFT HEART CATH AND CORS/GRAFTS ANGIOGRAPHY N/A 10/18/2018  ? Procedure: LEFT HEART CATH AND CORS/GRAFTS ANGIOGRAPHY;  Surgeon: Lorretta Harp, MD;  Location: [place] CV LAB;  Service: Cardiovascular;  Laterality: N/A;  ? LEFT HEART CATH AND CORS/GRAFTS ANGIOGRAPHY N/A 10/21/2018  ? Procedure: LEFT HEART CATH AND CORS/GRAFTS ANGIOGRAPHY;  Surgeon: Troy Sine, MD;  Location: [facility] CV LAB;  Service: Cardiovascular;  Laterality: N/A;  ? PENILE PROSTHESIS IMPLANT    ? PLEURAL BIOPSY Left 04/12/2019  ? Procedure: Pleural Biopsy;  Surgeon: Grace Isaac, MD;  Location: [facility];  Service: Thoracic;  Laterality: Left;  ? PLEURAL EFFUSION DRAINAGE Left 04/12/2019  ? Procedure: Drainage Of Pleural Effusion;  Surgeon: Grace Isaac, MD;  Location: [place];  Service: Thoracic;  Laterality: Left;  ? RIGHT HEART CATHETERIZATION N/A 11/14/2013  ? Procedure: RIGHT HEART CATH;  Surgeon: Larey Dresser, MD;  Location[facility] CATH LAB;  Service: Cardiovascular;  Laterality: N/A;  ? SHOULDER OPEN ROTATOR CUFF REPAIR Right 2003  ? TALC PLEURODESIS Left 04/12/2019  ? Procedure: Pietro Cassis;  Surgeon: Grace Isaac, MD;  Location: [place];  Service: Thoracic;  Laterality: Left;  ? TEE WITHOUT CARDIOVERSION N/A 03/24/2019  ?  Procedure: TRANSESOPHAGEAL ECHOCARDIOGRAM (TEE);  Surgeon: Larey Dresser, MD;  Location: [facility] ENDOSCOPY;  Service: Cardiovascular;  Laterality: N/A;  ? TEE WITHOUT CARDIOVERSION  04/12/2019  ? Procedure: Transesophageal Ec

## 2021-12-31 NOTE — H&P (Signed)
?History and Physical  ? ? ?Eric Gordon YTK:[id number] DOB: [birth year] DOA: 12/31/2021 ? ?PCP: Shon Baton, MD (Confirm with patient/family/NH records and if not entered, this has to be entered at [facility] point of entry) ?Patient coming from: Home ? ?I have personally briefly reviewed patient's old medical records in [place] ? ?Chief Complaint: Cough ? ?HPI: Eric Gordon is a 88 y.o. male with medical history significant of CAD status post CABG, chronic systolic CHF, AAA s/p repair, PAF on Eliquis, bilateral carotid stenosis status post stenting, chronic systolic CHF, HTN, CKD stage II, HLD, came with worsening of cough. ? ?Symptoms started 4 days ago with initially sore throat runny nose, and dry cough, over the last 3 days, runny nose and sore throat improved but dry cough has been persistent, and getting worse since yesterday.  No relieving or exacerbating factors, no chest pain no fever chills.  Wife reported patient blood pressure has been fairly controlled at home ? ?ED Course: Borderline bradycardia, blood pressure fairly controlled, afebrile.  Troponin negative. ? ?Creatinine 1.3, BBC 8.1.  CT angiogram negative for PE but a large left ventricle pseudoaneurysm with intercommunicating fluid of the chest.  CT angiogram reviewed by cardiology, given the patient had history of VATS, likely interconnected fluid or sequelae from chest tube. ? ?Review of Systems: As per HPI otherwise 14 point review of systems negative.  ? ? ?Past Medical History:  ?Diagnosis Date  ? Abdominal aortic aneurysm (AAA) ([place])   ? Achilles tendinitis of right lower extremity   ? Arthritis   ? "my whole body" (03/19/2018)  ? Atherosclerosis of coronary artery bypass graft w/o angina pectoris   ? Atrial fibrillation ([place])   ? Barrett's esophagus   ? CAD (coronary artery disease)   ? Carotid artery disease ([place])   ? CHF (congestive heart failure) ([place])   ? Chronic anticoagulation   ? PE  ? Chronic lower back pain   ?  Dyspnea   ? Essential tremor   ? GERD (gastroesophageal reflux disease)   ? High cholesterol   ? Hyperlipidemia   ? Hyperplasia, prostate   ? Hypertension   ? Hypothyroidism   ? Myocardial infarction [facility])   ? 2 in Jan. 2019  ? Nail dystrophy   ? OSA on CPAP   ? uses CPAP  ? Osteoarthrosis   ? Pneumonia   ? "now and once before" (03/19/2018)  ? Pulmonary embolism ([place])   ? April 2012 after CABG  ? S/P CABG (coronary artery bypass graft)   ? Sleep apnea   ? Spontaneous pneumothorax   ? left spontaneous pneumothorax/left pleural effusion, s/p chest tube 01/09/19; thoracentesis x2, s/p left VATS/tacl pleurodesis 04/12/19  ? ? ?Past Surgical History:  ?Procedure Laterality Date  ? ACHILLES TENDON REPAIR Bilateral   ? 2006 & 2004  ? CARDIAC CATHETERIZATION  11/2010  ? CORONARY ANGIOGRAPHY N/A 10/22/2018  ? Procedure: CORONARY ANGIOGRAPHY;  Surgeon: Troy Sine, MD;  Location: [place] CV LAB;  Service: Cardiovascular;  Laterality: N/A;  ? CORONARY ARTERY BYPASS GRAFT  March 2012  ? CABG X 5  ? ESOPHAGOGASTRODUODENOSCOPY (EGD) WITH PROPOFOL N/A 12/10/2020  ? Procedure: ESOPHAGOGASTRODUODENOSCOPY (EGD) WITH PROPOFOL;  Surgeon: Doran Stabler, MD;  Location: [facility] ENDOSCOPY;  Service: Gastroenterology;  Laterality: N/A;  ? INGUINAL HERNIA REPAIR Left 1980  ? INTRAVASCULAR PRESSURE WIRE/FFR STUDY N/A 10/22/2018  ? Procedure: INTRAVASCULAR PRESSURE WIRE/FFR STUDY;  Surgeon: Troy Sine, MD;  Location: [facility] INVASIVE CV  LAB;  Service: Cardiovascular;  Laterality: N/A;  ? IR THORACENTESIS ASP PLEURAL SPACE W/IMG GUIDE  02/04/2019  ? IR THORACENTESIS ASP PLEURAL SPACE W/IMG GUIDE  02/24/2019  ? KNEE ARTHROSCOPY Left 2006  ? LEFT HEART CATH AND CORS/GRAFTS ANGIOGRAPHY N/A 10/18/2018  ? Procedure: LEFT HEART CATH AND CORS/GRAFTS ANGIOGRAPHY;  Surgeon: Lorretta Harp, MD;  Location: [place] CV LAB;  Service: Cardiovascular;  Laterality: N/A;  ? LEFT HEART CATH AND CORS/GRAFTS ANGIOGRAPHY N/A 10/21/2018  ? Procedure: LEFT HEART  CATH AND CORS/GRAFTS ANGIOGRAPHY;  Surgeon: Troy Sine, MD;  Location: [place] CV LAB;  Service: Cardiovascular;  Laterality: N/A;  ? PENILE PROSTHESIS IMPLANT    ? PLEURAL BIOPSY Left 04/12/2019  ? Procedure: Pleural Biopsy;  Surgeon: Grace Isaac, MD;  Location: [place];  Service: Thoracic;  Laterality: Left;  ? PLEURAL EFFUSION DRAINAGE Left 04/12/2019  ? Procedure: Drainage Of Pleural Effusion;  Surgeon: Grace Isaac, MD;  Location: [place];  Service: Thoracic;  Laterality: Left;  ? RIGHT HEART CATHETERIZATION N/A 11/14/2013  ? Procedure: RIGHT HEART CATH;  Surgeon: Larey Dresser, MD;  Location: [facility] CATH LAB;  Service: Cardiovascular;  Laterality: N/A;  ? SHOULDER OPEN ROTATOR CUFF REPAIR Right 2003  ? TALC PLEURODESIS Left 04/12/2019  ? Procedure: Pietro Cassis;  Surgeon: Grace Isaac, MD;  Location: [place];  Service: Thoracic;  Laterality: Left;  ? TEE WITHOUT CARDIOVERSION N/A 03/24/2019  ? Procedure: TRANSESOPHAGEAL ECHOCARDIOGRAM (TEE);  Surgeon: Larey Dresser, MD;  Location: [facility] ENDOSCOPY;  Service: Cardiovascular;  Laterality: N/A;  ? TEE WITHOUT CARDIOVERSION  04/12/2019  ? Procedure: Transesophageal Echocardiogram (Tee);  Surgeon: Grace Isaac, MD;  Location: [place];  Service: Thoracic;;  ? THORACOSCOPY Left 04/12/2019  ? VIDEO BRONCHOSCOPY (N/A ) VIDEO ASSISTED THORACOSCOPY (Left Chest) TALC PLEURADESIS (Left   ? TRANSCAROTID ARTERY REVASCULARIZATION?  Right 09/13/2020  ? Procedure: RIGHT TRANSCAROTID ARTERY REVASCULARIZATION USING 63m X 482mENROUTE TRANSCAROTID STEND SYSTEM;  Surgeon: BrSerafina MitchellMD;  [facility];  Laterality: Right;  ? TRANSCAROTID ARTERY REVASCULARIZATION?  Left 02/08/2021  ? Procedure: LEFT TRANSCAROTID ARTERY REVASCULARIZATION;  Surgeon: BrSerafina MitchellMD;  Location: [facility] OR;  Service: Vascular;  Laterality: Left;  ? ULTRASOUND GUIDANCE FOR VASCULAR ACCESS  09/13/2020  ? Procedure: ULTRASOUND GUIDANCE FOR VASCULAR ACCESS;   Surgeon: BrSerafina MitchellMD;  [facility];  Service: Vascular;;  ? VIDEO ASSISTED THORACOSCOPY Left 04/12/2019  ? Procedure: VIDEO ASSISTED THORACOSCOPY;  Surgeon: GeGrace IsaacMD;  [facility] Service: Thoracic;  Laterality: Left;  ? VIDEO BRONCHOSCOPY N/A 04/12/2019  ? Procedure: VIDEO BRONCHOSCOPY;  Surgeon: GeGrace IsaacMD;  [facility] Service: Thoracic;  Laterality: N/A;  ? ? ? reports that he quit smoking about 37 years ago. His smoking use included cigarettes. He has a 35.00 pack-year smoking history. He has never used smokeless tobacco. He reports current alcohol use of about 2.0 standard drinks per week. He reports that he does not use drugs. ? ?Allergies  ?Allergen Reactions  ? Ancef [Cefazolin Sodium] Hives  ? Vancomycin Rash  ? ? ?Family History  ?Problem Relation Age of Onset  ? Coronary artery disease Mother   ? Hypertension Mother   ? Heart disease Mother   ? ? ? ?Prior to Admission medications   ?Medication Sig Start Date End Date Taking? Authorizing Provider  ?acetaminophen (TYLENOL) 500 MG tablet Take 1,000 mg by mouth every 6 (six)  hours as needed for moderate pain or headache.    [provider]  ?apixaban (ELIQUIS) 5 MG TABS tablet Take 5 mg by mouth 2 (two) times daily.    [provider]  ?Ascorbic Acid 500 MG CAPS Take 500 mg by mouth daily.    [provider]  ?atorvastatin (LIPITOR) 80 MG tablet Take 1 tablet (80 mg total) by mouth daily. 11/01/18   Larey Dresser, MD  ?carvedilol (COREG) 3.125 MG tablet Take 1 tablet (3.125 mg total) by mouth 2 (two) times daily. 03/21/19 11/15/21  Larey Dresser, MD  ?cetirizine (ZYRTEC) 10 MG tablet Take 10 mg by mouth daily.    [provider]  ?cholecalciferol (VITAMIN D) 1000 UNITS tablet Take 1,000 Units by mouth every evening.    [provider]  ?furosemide (LASIX) 40 MG tablet Take 0.5 tablets (20 mg total) by mouth every other day. 05/07/21   Larey Dresser, MD   ?isosorbide mononitrate (IMDUR) 30 MG 24 hr tablet Take 1 tablet (30 mg total) by mouth daily. 02/26/21   Larey Dresser, MD  ?levothyroxine (SYNTHROID, LEVOTHROID) 50 MCG tablet Take 50 mcg by mouth daily before

## 2022-01-01 ENCOUNTER — Inpatient Hospital Stay (HOSPITAL_BASED_OUTPATIENT_CLINIC_OR_DEPARTMENT_OTHER): Payer: Medicare Other

## 2022-01-01 DIAGNOSIS — Z20822 Contact with and (suspected) exposure to covid-19: Secondary | ICD-10-CM | POA: Diagnosis not present

## 2022-01-01 DIAGNOSIS — I5021 Acute systolic (congestive) heart failure: Secondary | ICD-10-CM | POA: Diagnosis not present

## 2022-01-01 DIAGNOSIS — R0600 Dyspnea, unspecified: Secondary | ICD-10-CM | POA: Diagnosis present

## 2022-01-01 DIAGNOSIS — I253 Aneurysm of heart: Secondary | ICD-10-CM | POA: Diagnosis not present

## 2022-01-01 LAB — BASIC METABOLIC PANEL
Anion gap: 8 (ref 5–15)
BUN: 25 mg/dL — ABNORMAL HIGH (ref 8–23)
CO2: 26 mmol/L (ref 22–32)
Calcium: 9.3 mg/dL (ref 8.9–10.3)
Chloride: 107 mmol/L (ref 98–111)
Creatinine, Ser: 1.12 mg/dL (ref 0.61–1.24)
GFR, Estimated: >60 mL/min (ref >60)
Glucose, Bld: 92 mg/dL (ref 70–99)
Potassium: 3.9 mmol/L (ref 3.5–5.1)
Sodium: 141 mmol/L (ref 135–145)

## 2022-01-01 LAB — ECHOCARDIOGRAM COMPLETE
AR max vel: 1.1 cm2
AV Area VTI: 1.16 cm2
AV Area mean vel: 1.11 cm2
AV Mean grad: 18 mmHg
AV Peak grad: 32.5 mmHg
Ao pk vel: 2.85 m/s
Area-P 1/2: 2.73 cm2
S' Lateral: 4.1 cm

## 2022-01-01 MED ORDER — APIXABAN 5 MG PO TABS
5.0000 mg | ORAL_TABLET | Freq: Two times a day (BID) | ORAL | Status: DC
  Administered 2022-01-01: 5 mg via ORAL
  Filled 2022-01-01: qty 1

## 2022-01-01 MED ORDER — GUAIFENESIN 100 MG/5ML PO LIQD
5.0000 mL | Freq: Three times a day (TID) | ORAL | Status: DC | PRN
Start: 2022-01-01 — End: 2022-01-01
  Administered 2022-01-01: 5 mL via ORAL
  Filled 2022-01-01: qty 5

## 2022-01-01 MED ORDER — GUAIFENESIN 100 MG/5ML PO LIQD
5.0000 mL | Freq: Four times a day (QID) | ORAL | Status: DC | PRN
Start: 2022-01-01 — End: 2022-01-01

## 2022-01-01 NOTE — Progress Notes (Signed)
Echocardiogram ?2D Echocardiogram has been performed. ? ?Arlyss Gandy ?01/01/2022, 10:08 AM ?

## 2022-01-01 NOTE — ED Notes (Signed)
Report received via secure messaging ?

## 2022-01-01 NOTE — ED Notes (Signed)
Given mucinex. Up to b/r. Wife at [facility]. Pt placement notified of plan for d/c. Pending TOC. ?

## 2022-01-01 NOTE — ED Notes (Signed)
ECHO at bedside.

## 2022-01-01 NOTE — Progress Notes (Signed)
PT Cancellation Note ? ?Patient Details ?Name: Eric Gordon ?MRN: [id number] ?DOB: 31-May-1931 ? ? ?Cancelled Treatment:    Reason Eval/Treat Not Completed: Other (comment).  Pt is getting up to walk to BR and on unit without help, no signs of fall risk.  Nursing is observing and will let PT know if this changes.  Screen is done, no further PT needs identified.   ? ? ?Ramond Dial ?01/01/2022, 1:04 PM ? ?Mee Hives, PT PhD ?Acute Rehab Dept. Number: [facility] [phone] and [place] [phone] ? ?

## 2022-01-01 NOTE — ED Notes (Signed)
Pt ambulated to restroom by self  ?

## 2022-01-01 NOTE — Progress Notes (Signed)
CSW spoke to patient and wife at bedside about code 52, patient granted verbal permission to sign document on his behalf.  ?

## 2022-01-01 NOTE — Progress Notes (Signed)
Heart Failure Navigator Progress Note ? ?Assessed for Heart & Vascular TOC clinic readiness.  ?Patient does not meet criteria due to being a patient of the [facility]. Post-hosp appt sch with them for 01/16/22 at 10:30 am  ? ?Navigator available for reassessment of patient.  ? ?Kevan Rosebush, RN, BSN, CHFN ?Heart Failure Navigator ?Heart & Vascular Care Navigation Team ? ? ?

## 2022-01-01 NOTE — Discharge Summary (Signed)
?DISCHARGE SUMMARY ? ?[place] ? ?MR#: [id number] ? ?DOB:09-Aug-1931  ?Date of Admission: 12/31/2021 ?Date of Discharge: 01/01/2022 ? ?Attending Physician:Vail Basista Hennie Duos, MD ? ?Patient's AJO:INOMV, Eric Reichmann, MD ? ?Consults: ?Cardiology ?CVTS ? ?Disposition: Discharge home ? ?Follow-up Appts: ? Follow-up Information   ? ? [facility]. Go on 01/16/2022.   ?Specialty: Cardiology ?Why: 10:30 AM, [facility], Entrance C, parking code [phone] ?Contact information: ?[address] ?[id number] mc ?[place] ?[phone] ? ?  ?  ? ? Shon Baton, MD Follow up in 1 week(s).   ?Specialty: Internal Medicine ?Contact information: ?[place] ?[place] [address] ?[phone] ? ? ?  ?  ? ? Larey Dresser, MD .   ?Specialty: Cardiology ?Contact information: ?[address] ?[place] [address] ?[phone] ? ? ?  ?  ? ?  ?  ? ?  ? ? ?Discharge Diagnoses: ?Left ventricular pseudoaneurysm ?Viral URI -hectic cough ?History of left spontaneous pneumothorax with recurrent left pleural effusion status post VATS with talc pleurodesis 2020 ?Chronic systolic CHF ?Paroxysmal atrial fibrillation on chronic anticoagulation ?Coronary artery disease status post CABG 2012 -subsequent vein graft occlusion and NSTEMI ?Chronic bilateral carotid stenosis ? ?Initial presentation: ?88 year old with a history of CAD status post CABG, chronic systolic CHF, AAA status post repair, PAF on Eliquis, bilateral carotid stenosis status post stenting, chronic systolic CHF, HTN, CKD stage II, and HLD who presented to the ED with 4 days of worsening dry cough initially associated with sore throat and runny nose.  In the ED he was afebrile with borderline bradycardia.  CT angio chest was negative for PE but revealed a large left ventricle pseudoaneurysm, and initially was thought to reveal potential communication with a fluid collection within his chest  suggestive of possible leaking. ? ?Hospital Course: ?The patient presented to the ED with a history above.  Evaluation for possible occult pneumonia included a CT angio of the chest.  Initially this was read as revealing a left ventricular pseudoaneurysm with potential evidence of leaking into the chest with a high density fluid collection in the chest.  Cardiology and TCTS were consulted.  Further evaluation of the CT images in the context of the patient's clinical history revealed that the "potential leakage" was actually a consequence of his talc pleurodesis and not indicative of blood loss.  Furthermore it was felt that the patient's LV pseudoaneurysm was a consequence of his known prior LV NSTEMI and in fact retrospective review of prior images suggested this was not a new finding.  The patient was therefore cleared for discharge by cardiology and TCTS with no need for further intervention.  In regard to his presenting symptoms the patient was reassured that there was no evidence of a bacterial pneumonia and that he was likely suffering with a viral URI.  He was counseled on over-the-counter medications that can be used for cough suppression and use of Tylenol for minor aches and pains.  He will follow-up as already established with his cardiologist in short course. ? ?Allergies as of 01/01/2022   ? ?   Reactions  ? Ancef [cefazolin Sodium] Hives  ? Vancomycin Rash  ? ?  ? ?  ?Medication List  ?  ? ?TAKE these medications   ? ?acetaminophen 500 MG tablet ?Commonly known as: TYLENOL ?Take 1,000 mg by mouth every 6 (six) hours as needed for moderate pain or headache. ?  ?apixaban 5 MG Tabs tablet ?Commonly known  asArne [place] ?Take 5 mg by mouth 2 (two) times daily. ?  ?Ascorbic Acid 500 MG Caps ?Take 500 mg by mouth daily. ?  ?atorvastatin 80 MG tablet ?Commonly known as: LIPITOR ?Take 1 tablet (80 mg total) by mouth daily. ?  ?carvedilol 3.125 MG tablet ?Commonly known as: COREG ?Take 3.125 mg by mouth 2 (two)  times daily with a meal. ?  ?CENTRUM SILVER PO ?Take 1 tablet by mouth daily. ?  ?cholecalciferol 1000 units tablet ?Commonly known as: VITAMIN D ?Take 1,000 Units by mouth every evening. ?  ?fexofenadine 180 MG tablet ?Commonly known as: ALLEGRA ?Take 180 mg by mouth daily. ?  ?furosemide 40 MG tablet ?Commonly known as: LASIX ?Take 0.5 tablets (20 mg total) by mouth every other day. ?  ?isosorbide mononitrate 30 MG 24 hr tablet ?Commonly known as: IMDUR ?Take 1 tablet (30 mg total) by mouth daily. ?  ?levothyroxine 50 MCG tablet ?Commonly known as: SYNTHROID ?Take 50 mcg by mouth daily before breakfast. ?  ?pantoprazole 40 MG tablet ?Commonly known as: PROTONIX ?Take 40 mg by mouth daily. ?  ?spironolactone 25 MG tablet ?Commonly known as: ALDACTONE ?Take 1 tablet (25 mg total) by mouth daily. ?  ?tamsulosin 0.4 MG Caps capsule ?Commonly known as: FLOMAX ?Take 0.4 mg by mouth daily after supper. ?  ?traMADol 50 MG tablet ?Commonly known as: ULTRAM ?Take 1 tablet (50 mg total) by mouth every 6 (six) hours as needed for moderate pain. ?  ?valsartan 40 MG tablet ?Commonly known as: Diovan ?Take 0.5 tablets (20 mg total) by mouth 2 (two) times daily. ?  ? ?  ? ? ?Day of Discharge ?BP (!) 91/48   Pulse (!) 55   Temp (!) 97.5 ?F (36.4 ?C) (Oral)   Resp (!) 23   SpO2 92%  ? ?Physical Exam: ?General: No acute respiratory distress ?Lungs: Clear to auscultation bilaterally without wheezes or crackles ?Cardiovascular: Regular rate and rhythm without murmur gallop or rub normal S1 and S2 ?Abdomen: Nontender, nondistended, soft, bowel sounds positive, no rebound, no ascites, no appreciable mass ?Extremities: No significant cyanosis, clubbing, or edema bilateral lower extremities ? ?Basic Metabolic Panel: ?Recent Labs  ?Lab 12/31/21 ?0902 12/31/21 ?1445 01/01/22 ?0438  ?NA 139 139 141  ?K 3.6 3.8 3.9  ?CL 103  --  107  ?CO2 28  --  26  ?GLUCOSE 113*  --  92  ?BUN 25*  --  25*  ?CREATININE 1.36*  --  1.12  ?CALCIUM 9.5  --   9.3  ? ? ?Liver Function Tests: ?Recent Labs  ?Lab 12/31/21 ?0902  ?AST 26  ?ALT 17  ?ALKPHOS 84  ?BILITOT 1.4*  ?PROT 7.1  ?ALBUMIN 3.6  ? ?CBC: ?Recent Labs  ?Lab 12/31/21 ?0902 12/31/21 ?1445  ?WBC 8.1  --   ?NEUTROABS 5.7  --   ?HGB 13.8 12.9*  ?HCT 43.5 38.0*  ?MCV 97.8  --   ?PLT 136*  --   ? ? ?Recent Results (from the past 240 hour(s))  ?Resp Panel by RT-PCR (Flu A&B, Covid) Nasopharyngeal Swab     Status: None  ? Collection Time: 12/31/21  9:05 AM  ? Specimen: Nasopharyngeal Swab; Nasopharyngeal(NP) swabs in vial transport medium  ?Result Value Ref Range Status  ? SARS Coronavirus 2 by RT PCR NEGATIVE NEGATIVE Final  ?  Comment: (NOTE) ?SARS-CoV-2 target nucleic acids are NOT DETECTED. ? ?The SARS-CoV-2 RNA is generally detectable in upper respiratory ?specimens during the acute phase of infection. The lowest ?  concentration of SARS-CoV-2 viral copies this assay can detect is ?138 copies/mL. A negative result does not preclude SARS-Cov-2 ?infection and should not be used as the sole basis for treatment or ?other patient management decisions. A negative result may occur with  ?improper specimen collection/handling, submission of specimen other ?than nasopharyngeal swab, presence of viral mutation(s) within the ?areas targeted by this assay, and inadequate number of viral ?copies(<138 copies/mL). A negative result must be combined with ?clinical observations, patient history, and epidemiological ?information. The expected result is Negative. ? ?Fact Sheet for Patients:  ?EntrepreneurPulse.com.au ? ?Fact Sheet for Healthcare Providers:  ?IncredibleEmployment.be ? ?This test is no t yet approved or cleared by the [place] FDA and  ?has been authorized for detection and/or diagnosis of SARS-CoV-2 by ?FDA under an Emergency Use Authorization (EUA). This EUA will remain  ?in effect (meaning this test can be used) for the duration of the ?COVID-19 declaration under Section  564(b)(1) of the Act, 21 ?U.S.C.section 360bbb-3(b)(1), unless the authorization is terminated  ?or revoked sooner.  ? ? ?  ? Influenza A by PCR NEGATIVE NEGATIVE Final  ? Influenza B by PCR NEGATIVE NEGATIVE Final

## 2022-01-01 NOTE — ED Notes (Signed)
Pt ambulatory to room, moved to yellow 40, admitting MD into see. Pt alert, NAD, calm, interactive, steady gait.  ?

## 2022-01-01 NOTE — Care Management CC44 (Signed)
Condition Code 44 Documentation Completed ? ?Patient Details  ?Name: Eric Gordon ?MRN: [id number] ?Date of Birth: [birth year] ? ? ?Condition Code 44 given:  Yes ?Patient signature on Condition Code 44 notice:  Yes ?Documentation of 2 MD's agreement:  Yes ?Code 44 added to claim:  Yes ? ? ? ?Fuller Mandril, RN ?01/01/2022, 2:06 PM ? ?

## 2022-01-01 NOTE — ED Notes (Signed)
TOC at BS 

## 2022-01-01 NOTE — Care Management Obs Status (Signed)
MEDICARE OBSERVATION STATUS NOTIFICATION ? ? ?Patient Details  ?Name: Eric Gordon ?MRN: [id number] ?Date of Birth: 13-May-1931 ? ? ?Medicare Observation Status Notification Given:  Yes ? ? ? ?Fuller Mandril, RN ?01/01/2022, 2:06 PM ?

## 2022-01-01 NOTE — Progress Notes (Signed)
? ?Progress Note ? ?Patient Name: Eric Gordon ?Date of Encounter: 01/01/2022 ? ?[facility] Cardiologist: None  ? ?Subjective  ? ?Pt is breathing better   No CP   ? ?Inpatient Medications  ?  ?Scheduled Meds: ? atorvastatin  80 mg Oral Daily  ? benzonatate  100 mg Oral TID  ? carvedilol  3.125 mg Oral BID  ? [START ON 01/02/2022] furosemide  20 mg Oral QODAY  ? irbesartan  37.5 mg Oral Daily  ? isosorbide mononitrate  30 mg Oral Daily  ? levothyroxine  50 mcg Oral QAC breakfast  ? loratadine  10 mg Oral Daily  ? melatonin  3 mg Oral QHS  ? pantoprazole  40 mg Oral Daily  ? sodium chloride flush  3 mL Intravenous Q12H  ? spironolactone  25 mg Oral QPM  ? tamsulosin  0.4 mg Oral QPC supper  ? ?Continuous Infusions: ? sodium chloride    ? ?PRN Meds: ?sodium chloride, acetaminophen, ondansetron (ZOFRAN) IV, sodium chloride flush, traMADol  ? ?Vital Signs  ?  ?Vitals:  ? 01/01/22 0000 01/01/22 0200 01/01/22 0353 01/01/22 0500  ?BP: (!) 113/52 123/60  (!) 109/55  ?Pulse: (!) 55 (!) 57 (!) 47 (!) 55  ?Resp: (!) 21 (!) 24 (!) 21 20  ?Temp:      ?TempSrc:      ?SpO2: 93% 96%  97%  ? ?No intake or output data in the 24 hours ending 01/01/22 0723 ? ?  11/15/2021  ? 11:55 AM 11/05/2021  ?  1:16 PM 08/12/2021  ?  9:17 AM  ?Last 3 Weights  ?Weight (lbs) 207 lb 3.2 oz 209 lb 9.6 oz 207 lb 12.8 oz  ?Weight (kg) 93.985 kg 95.074 kg 94.257 kg  ?   ? ?I/O incomplete ? ?Telemetry  ?  ? SR - Personally Reviewed ? ?ECG  ?  ?No new  - Personally Reviewed ? ?Physical Exam  ? ?GEN: No acute distress.   ?Neck: JVP is normal ?Cardiac: RRR, no murmurs ?Respiratory: Clear to auscultation bilaterally. ?GI: Soft, nontender, non-distended  ?MS: No edema; No deformity. ?Neuro:  Nonfocal  ?Psych: Normal affect  ? ?Labs  ?  ?High Sensitivity Troponin:   ?Recent Labs  ?Lab 12/31/21 ?0902 12/31/21 ?1116  ?TROPONINIHS 11 12  ?   ?Chemistry ?Recent Labs  ?Lab 12/31/21 ?0902 12/31/21 ?1445 01/01/22 ?0438  ?NA 139 139 141  ?K 3.6 3.8 3.9  ?CL 103   --  107  ?CO2 28  --  26  ?GLUCOSE 113*  --  92  ?BUN 25*  --  25*  ?CREATININE 1.36*  --  1.12  ?CALCIUM 9.5  --  9.3  ?PROT 7.1  --   --   ?ALBUMIN 3.6  --   --   ?AST 26  --   --   ?ALT 17  --   --   ?ALKPHOS 84  --   --   ?BILITOT 1.4*  --   --   ?GFRNONAA 50*  --  >60  ?ANIONGAP 8  --  8  ?  ?Lipids No results for input(s): CHOL, TRIG, HDL, LABVLDL, LDLCALC, CHOLHDL in the last 168 hours.  ?Hematology ?Recent Labs  ?Lab 12/31/21 ?0902 12/31/21 ?1445  ?WBC 8.1  --   ?RBC 4.45  --   ?HGB 13.8 12.9*  ?HCT 43.5 38.0*  ?MCV 97.8  --   ?MCH 31.0  --   ?MCHC 31.7  --   ?RDW 14.5  --   ?  PLT 136*  --   ? ?Thyroid No results for input(s): TSH, FREET4 in the last 168 hours.  ?BNP ?Recent Labs  ?Lab 12/31/21 ?0902  ?BNP 341.1*  ?  ?DDimer No results for input(s): DDIMER in the last 168 hours.  ? ?Radiology  ?  ?DG Chest 2 View ? ?Result Date: 12/31/2021 ?CLINICAL DATA:  Cough, shortness of breath, and chest pain for the past 4 days. EXAM: CHEST - 2 VIEW COMPARISON:  Chest x-ray dated October 25, 2019. FINDINGS: The heart size and mediastinal contours are within normal limits. Prior CABG. Normal pulmonary vascularity. Chronic small left pleural effusion and left basilar atelectasis/scarring is not significantly changed. The right lung is clear. No pneumothorax. Similar biapical pleuroparenchymal scarring. No acute osseous abnormality. IMPRESSION: 1. Similar chronic small left pleural effusion with adjacent left basilar atelectasis/scarring. Electronically Signed   By: Titus Dubin M.D.   On: 12/31/2021 09:18  ? ?CT Angio Chest PE W and/or Wo Contrast ? ?Addendum Date: 12/31/2021   ?ADDENDUM REPORT: 12/31/2021 14:57 ADDENDUM: I just discussed this case with Dr. Harrington Challenger from cardiology. She pointed out that the patient had a left hemithoracic VATS procedure on 04/13/2019 for recurrent left pleural effusion and talc pleurodesis was performed at the time. Given this history, the high attenuation material tracking along the medial  pleural surface of the left hemithorax inferiorly can be explained by the history of talc pleurodesis. Electronically Signed   By: Misty Stanley M.D.   On: 12/31/2021 14:57  ? ?Result Date: 12/31/2021 ?CLINICAL DATA:  Shortness of breath. EXAM: CT ANGIOGRAPHY CHEST WITH CONTRAST TECHNIQUE: Multidetector CT imaging of the chest was performed using the standard protocol during bolus administration of intravenous contrast. Multiplanar CT image reconstructions and MIPs were obtained to evaluate the vascular anatomy. RADIATION DOSE REDUCTION: This exam was performed according to the departmental dose-optimization program which includes automated exposure control, adjustment of the mA and/or kV according to patient size and/or use of iterative reconstruction technique. CONTRAST:  68m OMNIPAQUE IOHEXOL 350 MG/ML SOLN COMPARISON:  CT chest 03/14/2019 FINDINGS: Cardiovascular: Heart size upper normal. No substantial pericardial effusion. 3.6 x 3.0 cm pseudoaneurysm identified inferior wall left ventricle. Previous CT of 03/14/2019 was performed without intravenous contrast material which limited assessment through this region. There is high density material tracking along the inferior pericardium (see axial image 95/series 5 and posteromedially in what appears to be the pleural space (image 95/5) raising concern for contrast extravasation. This extraluminal high attenuation is well demonstrated on coronal images 63-88 of series 8 and sagittal images 144-169 of series 9). Moderate atherosclerotic calcification is noted in the wall of the thoracic aorta. There is no filling defect within the opacified pulmonary arteries to suggest the presence of an acute pulmonary embolus. Mediastinum/Nodes: No mediastinal lymphadenopathy. There is no hilar lymphadenopathy. The esophagus has normal imaging features. There is no axillary lymphadenopathy. Lungs/Pleura: Bilateral dependent lower lobe collapse/consolidative opacity evident, left  greater than right. Small left pleural effusion noted. Upper Abdomen: 3.9 cm water density lesion in the posterior right upper abdomen, just posterior to the adrenal gland is probably an exophytic cyst from the upper pole the right kidney but has been incompletely visualized. Musculoskeletal: No worrisome lytic or sclerotic osseous abnormality. Review of the MIP images confirms the above findings. IMPRESSION: 1. 3.6 x 3.0 cm pseudoaneurysm identified inferior wall left ventricle. Previous CT of 03/14/2019 was performed without intravenous contrast material which limited assessment through this region. There is high density material tracking along the  inferior pericardium and posteromedially in what appears to be the pleural space, raising concern for contrast extravasation. Previous noncontrast CT not demonstrate any pleural or pericardial calcification in this region. 2. No CT evidence for acute pulmonary embolus. 3. Bilateral dependent lower lobe collapse/consolidative opacity, left greater than right, with small left pleural effusion. 4. 3.9 cm water density lesion in the posterior right upper abdomen, just posterior to the adrenal gland is probably an exophytic cyst from the upper pole the right kidney but has been incompletely visualized. 5. Aortic Atherosclerosis (ICD10-I70.0). I personally discussed these results by telephone with Dr. Jeanell Sparrow (covering for Dr. Matilde Sprang) in the emergency department at approximately 1311 hours on 12/31/2021. Electronically Signed: By: Misty Stanley M.D. On: 12/31/2021 13:20   ? ?Cardiac Studies  ? ? ? ?Patient Profile  ? ?  ?  ?Eric Gordon is a 88 y.o. male with a hx of CAD  who is being seen 12/31/2021 for the evaluation of possilbe rupture of LV pseudoaneurysm at the request of Dr Matilde Sprang. ? ?Assessment & Plan  ?  ?1  Pseudoaneurysm   Chronic After rereview of CT scan with radiology   No evid for leak  Pt seen by CV surgery   No indication for intervention ? ?2  CAD   Pt with  signficant CAD    Echo today   CP was atypical for ischemia  ? ?3  HFmrEF  Echo today   Volume is OK on exam ? ?Will review echo but from cardiac standpont feel pt is  OK to d/c   Will follow as out

## 2022-01-06 ENCOUNTER — Ambulatory Visit (INDEPENDENT_AMBULATORY_CARE_PROVIDER_SITE_OTHER): Payer: Medicare Other | Admitting: Physician Assistant

## 2022-01-06 ENCOUNTER — Encounter: Payer: Self-pay | Admitting: Physician Assistant

## 2022-01-06 ENCOUNTER — Ambulatory Visit (HOSPITAL_COMMUNITY)
Admission: RE | Admit: 2022-01-06 | Discharge: 2022-01-06 | Disposition: A | Discharge status: Home / Self Care | Payer: Medicare Other | Source: Ambulatory Visit | Attending: Surgery | Admitting: Surgery

## 2022-01-06 VITALS — BP 98/44 | HR 52 | Resp 20 | Ht 75.5 in | Wt 203.0 lb

## 2022-01-06 DIAGNOSIS — I6523 Occlusion and stenosis of bilateral carotid arteries: Secondary | ICD-10-CM | POA: Diagnosis not present

## 2022-01-06 DIAGNOSIS — I6529 Occlusion and stenosis of unspecified carotid artery: Secondary | ICD-10-CM

## 2022-01-06 NOTE — Progress Notes (Signed)
? ? ?History of Present Illness:  Patient is a 88 y.o. year old male who presents for evaluation of carotid stenosis.  He is s/p left TCAR (Date: 02/08/21) by Dr. Trula Slade.  This was performed due to high-grade asymptomatic stenosis.  With prior history of right TCAR 09/13/2020.  ? ?The patient denies symptoms of TIA, amaurosis, aphasia, or other symptoms of stroke.  He is medically managed on ASA and Statin daily.  He was seen in the ED recent for SOB and generalized malaise.  All studies performed were within normal limits and he was given a diagnosis of viral infection excluding COVID. ? ?Past Medical History:  ?Diagnosis Date  ? Abdominal aortic aneurysm (AAA) ([place])   ? Achilles tendinitis of right lower extremity   ? Arthritis   ? "my whole body" (03/19/2018)  ? Atherosclerosis of coronary artery bypass graft w/o angina pectoris   ? Atrial fibrillation ([place])   ? Barrett's esophagus   ? CAD (coronary artery disease)   ? Carotid artery disease ([place])   ? CHF (congestive heart failure) ([place])   ? Chronic anticoagulation   ? PE  ? Chronic lower back pain   ? Dyspnea   ? Essential tremor   ? GERD (gastroesophageal reflux disease)   ? High cholesterol   ? Hyperlipidemia   ? Hyperplasia, prostate   ? Hypertension   ? Hypothyroidism   ? Myocardial infarction [facility])   ? 2 in Jan. 2019  ? Nail dystrophy   ? OSA on CPAP   ? uses CPAP  ? Osteoarthrosis   ? Pneumonia   ? "now and once before" (03/19/2018)  ? Pulmonary embolism ([place])   ? April 2012 after CABG  ? S/P CABG (coronary artery bypass graft)   ? Sleep apnea   ? Spontaneous pneumothorax   ? left spontaneous pneumothorax/left pleural effusion, s/p chest tube 01/09/19; thoracentesis x2, s/p left VATS/tacl pleurodesis 04/12/19  ? ? ?Past Surgical History:  ?Procedure Laterality Date  ? ACHILLES TENDON REPAIR Bilateral   ? 2006 & 2004  ? CARDIAC CATHETERIZATION  11/2010  ? CORONARY ANGIOGRAPHY N/A 10/22/2018  ? Procedure: CORONARY ANGIOGRAPHY;  Surgeon: Troy Sine, MD;   Location: [place] [facility];  Service: Cardiovascular;  Laterality: N/A;  ? CORONARY ARTERY BYPASS GRAFT  March 2012  ? CABG X 5  ? ESOPHAGOGASTRODUODENOSCOPY (EGD) WITH PROPOFOL N/A 12/10/2020  ? Procedure: ESOPHAGOGASTRODUODENOSCOPY (EGD) WITH PROPOFOL;  Surgeon: Doran Stabler, MD;  Location: [facility] ENDOSCOPY;  Service: Gastroenterology;  Laterality: N/A;  ? INGUINAL HERNIA REPAIR Left 1980  ? INTRAVASCULAR PRESSURE WIRE/FFR STUDY N/A 10/22/2018  ? Procedure: INTRAVASCULAR PRESSURE WIRE/FFR STUDY;  Surgeon: Troy Sine, MD;  Location: [place] [facility];  Service: Cardiovascular;  Laterality: N/A;  ? IR THORACENTESIS ASP PLEURAL SPACE W/IMG GUIDE  02/04/2019  ? IR THORACENTESIS ASP PLEURAL SPACE W/IMG GUIDE  02/24/2019  ? KNEE ARTHROSCOPY Left 2006  ? LEFT HEART CATH AND CORS/GRAFTS ANGIOGRAPHY N/A 10/18/2018  ? Procedure: LEFT HEART CATH AND CORS/GRAFTS ANGIOGRAPHY;  Surgeon: Lorretta Harp, MD;  Location: [place] [facility];  Service: Cardiovascular;  Laterality: N/A;  ? LEFT HEART CATH AND CORS/GRAFTS ANGIOGRAPHY N/A 10/21/2018  ? Procedure: LEFT HEART CATH AND CORS/GRAFTS ANGIOGRAPHY;  Surgeon: Troy Sine, MD;  Location: [place] [facility];  Service: Cardiovascular;  Laterality: N/A;  ? PENILE PROSTHESIS IMPLANT    ? PLEURAL BIOPSY Left 04/12/2019  ? Procedure: Pleural Biopsy;  Surgeon: Grace Isaac, MD;  Location: [facility];  Service: Thoracic;  Laterality: Left;  ? PLEURAL EFFUSION DRAINAGE Left 04/12/2019  ? Procedure: Drainage Of Pleural Effusion;  Surgeon: Grace Isaac, MD;  Location: [place];  Service: Thoracic;  Laterality: Left;  ? RIGHT HEART CATHETERIZATION N/A 11/14/2013  ? Procedure: RIGHT HEART CATH;  Surgeon: Larey Dresser, MD;  [facility];  Service: Cardiovascular;  Laterality: N/A;  ? SHOULDER OPEN ROTATOR CUFF REPAIR Right 2003  ? TALC PLEURODESIS Left 04/12/2019  ? Procedure: Pietro Cassis;  Surgeon: Grace Isaac, MD;  Location: [place];  Service: Thoracic;   Laterality: Left;  ? TEE WITHOUT CARDIOVERSION N/A 03/24/2019  ? Procedure: TRANSESOPHAGEAL ECHOCARDIOGRAM (TEE);  Surgeon: Larey Dresser, MD;  Location: [facility] ENDOSCOPY;  Service: Cardiovascular;  Laterality: N/A;  ? TEE WITHOUT CARDIOVERSION  04/12/2019  ? Procedure: Transesophageal Echocardiogram (Tee);  Surgeon: Grace Isaac, MD;  Location: [place];  Service: Thoracic;;  ? THORACOSCOPY Left 04/12/2019  ? VIDEO BRONCHOSCOPY (N/A ) VIDEO ASSISTED THORACOSCOPY (Left Chest) TALC PLEURADESIS (Left   ? TRANSCAROTID ARTERY REVASCULARIZATION?  Right 09/13/2020  ? Procedure: RIGHT TRANSCAROTID ARTERY REVASCULARIZATION USING 56m X 436mENROUTE TRANSCAROTID STEND SYSTEM;  Surgeon: BrSerafina MitchellMD;  [facility];  Laterality: Right;  ? TRANSCAROTID ARTERY REVASCULARIZATION?  Left 02/08/2021  ? Procedure: LEFT TRANSCAROTID ARTERY REVASCULARIZATION;  Surgeon: BrSerafina MitchellMD;  Location: [facility];  Service: Vascular;  Laterality: Left;  ? ULTRASOUND GUIDANCE FOR VASCULAR ACCESS  09/13/2020  ? Procedure: ULTRASOUND GUIDANCE FOR VASCULAR ACCESS;  Surgeon: BrSerafina MitchellMD;  [facility];  Service: Vascular;;  ? VIDEO ASSISTED THORACOSCOPY Left 04/12/2019  ? Procedure: VIDEO ASSISTED THORACOSCOPY;  Surgeon: GeGrace IsaacMD;  [facility]: Thoracic;  Laterality: Left;  ? VIDEO BRONCHOSCOPY N/A 04/12/2019  ? Procedure: VIDEO BRONCHOSCOPY;  Surgeon: GeGrace IsaacMD;  [facility] Service: Thoracic;  Laterality: N/A;  ? ? ? ?Social History ?Social History  ? ?Tobacco Use  ? Smoking status: Former  ?  Packs/day: 1.00  ?  Years: 35.00  ?  Pack years: 35.00  ?  Types: Cigarettes  ?  Quit date: 02/18/1984  ?  Years since quitting: 37.9  ? Smokeless tobacco: Never  ?Vaping Use  ? Vaping Use: Never used  ?Substance Use Topics  ? Alcohol use: Yes  ?  Alcohol/week: 2.0 standard drinks  ?  Types: 2 Standard drinks or equivalent per week  ? Drug use: Never  ? ? ?Family  History ?Family History  ?Problem Relation Age of Onset  ? Coronary artery disease Mother   ? Hypertension Mother   ? Heart disease Mother   ? ? ?Allergies ? ?Allergies  ?Allergen Reactions  ? Ancef [Cefazolin Sodium] Hives  ? Vancomycin Rash  ? ? ? ?Current Outpatient Medications  ?Medication Sig Dispense Refill  ? acetaminophen (TYLENOL) 500 MG tablet Take 1,000 mg by mouth every 6 (six) hours as needed for moderate pain or headache.    ? apixaban (ELIQUIS) 5 MG TABS tablet Take 5 mg by mouth 2 (two) times daily.    ? Ascorbic Acid 500 MG CAPS Take 500 mg by mouth daily.    ? atorvastatin (LIPITOR) 80 MG tablet Take 1 tablet (80 mg total) by mouth daily. 30 tablet 6  ? carvedilol (COREG) 3.125 MG tablet Take 3.125 mg by mouth 2 (two) times daily with a meal.    ? cholecalciferol (VITAMIN  D) 1000 UNITS tablet Take 1,000 Units by mouth every evening.    ? fexofenadine (ALLEGRA) 180 MG tablet Take 180 mg by mouth daily.    ? furosemide (LASIX) 40 MG tablet Take 0.5 tablets (20 mg total) by mouth every other day. 20 tablet 3  ? isosorbide mononitrate (IMDUR) 30 MG 24 hr tablet Take 1 tablet (30 mg total) by mouth daily.    ? levothyroxine (SYNTHROID, LEVOTHROID) 50 MCG tablet Take 50 mcg by mouth daily before breakfast.    ? Multiple Vitamins-Minerals (CENTRUM SILVER PO) Take 1 tablet by mouth daily.     ? pantoprazole (PROTONIX) 40 MG tablet Take 40 mg by mouth daily.     ? spironolactone (ALDACTONE) 25 MG tablet Take 1 tablet (25 mg total) by mouth daily. 90 tablet 3  ? tamsulosin (FLOMAX) 0.4 MG CAPS capsule Take 0.4 mg by mouth daily after supper.     ? traMADol (ULTRAM) 50 MG tablet Take 1 tablet (50 mg total) by mouth every 6 (six) hours as needed for moderate pain. 20 tablet 0  ? valsartan (DIOVAN) 40 MG tablet Take 0.5 tablets (20 mg total) by mouth 2 (two) times daily. 30 tablet 3  ? ?No current facility-administered medications for this visit.  ? ? ?ROS:  ? ?General:  No weight loss, Fever, chills ? ?HEENT:  No recent headaches, no nasal bleeding, no visual changes, no sore throat ? ?Neurologic: No dizziness, blackouts, seizures. No recent symptoms of stroke or mini- stroke. No recent episodes of slurred speech, or te

## 2022-01-15 NOTE — Progress Notes (Addendum)
?  ? ?HDate:  01/16/2022  ? ?ID:  Eric Gordon, DOB [birth year], MRN [id number]   ?Provider location: [facility] ?Type of Visit: Established patient ? ?PCP:  Shon Baton, MD  ?Cardiologist:  Dr. Aundra Dubin ?  ?History of Present Illness: ?Eric Gordon is a 88 y.o. male with history of CAD s/p CABG.  He has had trouble long-term with exertional dyspnea.  Dyspnea triggered evaluation in 2012 leading to CABG but was not resolved by CABG.  PFTs in 3/14 showed only mild obstructive airways disease and V/Q scan showed no PE.  Echo in 9/14 showed normal LV systolic function with moderately dilated RV. [name] in 2/15 showed normal right and left heart filling pressures and normal PA pressure.  Finally, he had a CPX in 3/15 that showed normal capacity compared to age-matched sedentary norms.  He was noted to have chronotropic incompetence.  At a prior appointment, metoprolol stopped given chronotropic incompetence noted on CPX.  Dyspnea improved significantly with weight loss.  He had Cardiolite in 8/18 with EF 52%, no ischemia or infarction.  ?  ?He was admitted in 1/20 with NSTEMI.  LHC showed new occlusion of the branch of SVG-ramus and OM that touched down on OM.  There were also serial 70% stenoses in the mid/distal RCA.  There was not thought to be an interventional option and patient was treated medically. He was discharged home but presented a couple days later with recurrent chest pain and dyspnea.  Cath was repeated showing no change from prior.  This admission, he had FFR of the RCA which did not suggest hemodynamic significance.  Echo showed EF 55-60%, normal RV.  CTA did not show a PE. He was noted to be volume overloaded and was diuresed.  He was also noted to be in atrial fibrillation with RVR transiently.  ASA/Plavix was stopped and Eliquis was started. He was discharged to SNF.  Atrial fibrillation recurred and he was started on amiodarone with conversion back to NSR.  ? ?In  4/20, he was admitted with left spontaneous PTX requiring chest tube.  After this, he developed recurrent left pleural effusions requiring thoracentesis x 3 so far.  Pleural fluid was exudative with eosinophil predominance.  With elevated ESR, there was concern that amiodarone could be involved, so this medication was stopped.  CT chest in 6/20 showed LLL consolidation/collapse with small left effusion, there was a rim-enhancing lesion posterior to the heart between esophagus and left atrium, ?loculated fluid.  TEE was done in 7/20 and confirmed loculated pleural effusion behind the left atrium.  In 7/20, patient had VATS on the left.  ? ?Carotid dopplers in 9/21 showed 80-99% RICA stenosis, 96-22% LICA.  AAA [place] in 9/21 showed 4.5 cm AAA. Patient was referred to Dr. Trula Slade for evaluation => he has had TCAR on right in 12/21.  He had left TCAR in 5/22.  ? ?Cardiolite in 12/21 with EF 41%, no ischemia, fixed inferolateral defect. Echo in 1/22 showed EF 40-45%, basal to mid inferolateral AK, basal inferior HK, normal RV, mild aortic stenosis mean gradient 11 mmHg. ? ?He was unable to tolerate dapagliflozin and is off it. He was lightheaded on low dose Entresto so this was stopped.  ? ?Zio monitor in 1/23 showed 25% atrial flutter.  He was referred to EP, by the time he saw EP, burden of atrial fibrillation was lower.  ? ?Echo 02/23: EF 45-50%, moderate LVH, akinetic anterolateral wall and basal inferolateral segment, normal  RV, mild AS mean gradient 15 mmHg with IVC normal.  ? ?Stress MPI 03/23: EF 37%, medium sized, severe fixed defect in basal to mid apical inferior and inferolateral LV walls, and small fixed defect in basal to mid anterolateral LV walls. Findings consistent with infarct ?  ?Admitted 12/31/21 with complaints of cough, shortness of breath and stabbing left-sided chest pain. CP not felt to be ischemic. Did not appear volume overloaded. Cardiology and TCTS reviewed abnormalities noted on CTA. CTA chest  demonstrated 2-3 cm inferior wall LV pseduoaneurysm likely related to remote infarct from vein graft occlusion. Enhancement noted on imaging d/t talc pleurodesis in lfet hemithorax. No surgical intervention felt to be warranted. ? ?He is here today for hospital follow-up. Feels poorly. He is more fatigued and notes quite a bit of orthostatic dizziness. Has almost fallen standing to get up out of a chair. He has good appetite but his wife reports he does not drink a lot of fluids. Weight down 3 lb from last office visit. Has chronic dyspnea with exertion which is unchanged. No orthopnea, PND or lower extremity edema. Notes left-sided chest pain that is reproducible with palpation of left sternal border. ? ?He is taking medicines as prescribed. Had a couple of nosebleeds recently.  ?  ?  ?PMH: ?1. Essential tremor ?2. CAD: s/p CABG in 3/12.   ?- Cardiolite (8/18): EF 52%, no ischemia/infarction.  ?- NSTEMI (1/20):  LHC showed new occlusion of the branch of SVG-ramus and OM that touched down on OM.  There were also serial 70% stenoses in the mid/distal RCA.  FFR of RCA was negative.  ?- Cardiolite (12/21): EF 41%, no ischemia, fixed inferolateral defect ?3. Atrial fibrillation: Only noted post-op CABG.  ?4. PE: Post-op CABG in 2012.  ?5. AAA: 3.6 cm on [place] in 3/14.  3.6 cm on [place] in 3/15. 3.6 cm on [place] 8/16. 4.1 cm on [place] 4/17.  ?- AAA [place] (5/18): 4.1 cm AAA.  ?- AAA [place] (5/19): 4.1 cm AAA.  ?- AAA [place] (4/20): 4.1 cm AAA.  ?- AAA [place] (9/21): 4.5 cm AAA ?- AAA [place] (9/22): 4.5 cm AAA ?6. OSA: On CPAP.  ?7. Carotid stenosis: Carotid dopplers (1/27) with 51-70% LICA stenosis.  Carotid dopplers (0/17) with 49-44% LICA stenosis. Carotid dopplers (3/16) with 60-79% RCIA stenosis, 96-75% LICA stenosis.  ?- Carotid dopplers (8/16) with 40-59% RICA stenosis, 91-63% LICA stenosis.  ?- Carotid dopplers (8/17) with 84-66% RICA, 59-93% LICA. ?- Carotid dopplers (9/18) with 57-01% RICA, 77-93% LICA.   ?- Carotid dopplers (9/19) with 90-30%  RICA, 09-23% LICA.  ?- Carotid dopplers (9/20) with 60-79% BICA ?- Carotid dopplers (9/21): 30-07% RICA, 62-26% LICA ?- Right TCAR 33/35, left TCAR 5/22.  ?8. Asthma ?9. PNA x 2 ?10. Chronic diastolic CHF/dyspnea: Echo (9/14) with EF 60-65%, mild LVH, very mild AS with mean gradient 10 mmHg, RV moderately dilated.  PFTs (3/14) showed mild obstructive airways disease.  V/Q scan (3/14) with no PE.  ENT workup for upper respiratory causes was negative.  RHC (2/15) with mean RA 7, PA 32/12, mean PCWP 13, CI 3.18.  CPX (3/15) with peak VO2 16.4, VE/VCO2 33; normal when compared to age-matched sedentary normals; chronotropic incompetence was noted.  Dyspnea was improved with weight loss.  ?- Echo (8/17): EF 60-65%, mild AS.  ?- Echo (1/20): EF 55-60%, normal RV size and systolic function.  ?- Echo (6/20): EF 55%, mild LVH, mild AS, normal RV size and systolic function, ?LA mass or  mass impinging on posterior LA.  ?- TEE (7/20): EF 50-55%, mild LVH, basal inferolateral aneurysm, normal RV size/systolic function, loculated pleural fluid behind LA.  ?- Echo (1/22): EF 40-45%, basal to mid inferolateral AK, basal inferior HK, normal RV, mild aortic stenosis mean gradient 11 mmHg. ?- Echo (2/23): EF 45-50%, moderate LVH, normal RV, mild AS mean gradient 15 mmHg with IVC normal.  ?11. HTN ?12. Aortic stenosis: Mild.  ?13. Ascending aortic aneurysm: CTA chest with 4.4 cm ascending aorta in 1/20.  ?- 4.2 cm ascending aorta on CT chest 6/20.  ?14. Atrial fibrillation: Paroxysmal.  ?15. CKD: Stage 3.  ?16. Left lung spontaneous PTX then recurrent left pleural effusion.  ?- Left VATS in 7/20.  ?17. Palpitations:  ?- Zio patch (6/20): NSR with 1 short NSVT run and few short SVT runs, no atrial fibrillation.  ?- Zio patch 2 wks (2/21): 6 short NSVT runs, 66 short SVT runs, rare PACs/PVCs.  ?18. COVID-19 infection 4/22.  ? ? ?Current Outpatient Medications  ?Medication Sig Dispense Refill  ? acetaminophen (TYLENOL) 500 MG tablet Take  1,000 mg by mouth every 6 (six) hours as needed for moderate pain or headache.    ? apixaban (ELIQUIS) 5 MG TABS tablet Take 5 mg by mouth 2 (two) times daily.    ? Ascorbic Acid 500 MG CAPS Take 500 mg by

## 2022-01-16 ENCOUNTER — Ambulatory Visit (HOSPITAL_COMMUNITY)
Admission: RE | Admit: 2022-01-16 | Discharge: 2022-01-16 | Disposition: A | Discharge status: Home / Self Care | Payer: Medicare Other | Source: Ambulatory Visit | Attending: Physician Assistant | Admitting: Physician Assistant

## 2022-01-16 ENCOUNTER — Ambulatory Visit (HOSPITAL_COMMUNITY)
Admit: 2022-01-16 | Discharge: 2022-01-16 | Disposition: A | Discharge status: Home / Self Care | Payer: Medicare Other | Attending: Physician Assistant | Admitting: Physician Assistant

## 2022-01-16 ENCOUNTER — Encounter (HOSPITAL_COMMUNITY): Payer: Self-pay

## 2022-01-16 VITALS — BP 120/78 | HR 68 | Wt 204.8 lb

## 2022-01-16 DIAGNOSIS — J9383 Other pneumothorax: Secondary | ICD-10-CM | POA: Insufficient documentation

## 2022-01-16 DIAGNOSIS — Z7901 Long term (current) use of anticoagulants: Secondary | ICD-10-CM | POA: Insufficient documentation

## 2022-01-16 DIAGNOSIS — I714 Abdominal aortic aneurysm, without rupture, unspecified: Secondary | ICD-10-CM | POA: Diagnosis not present

## 2022-01-16 DIAGNOSIS — I110 Hypertensive heart disease with heart failure: Secondary | ICD-10-CM | POA: Diagnosis not present

## 2022-01-16 DIAGNOSIS — R5383 Other fatigue: Secondary | ICD-10-CM | POA: Diagnosis not present

## 2022-01-16 DIAGNOSIS — R0600 Dyspnea, unspecified: Secondary | ICD-10-CM | POA: Diagnosis not present

## 2022-01-16 DIAGNOSIS — J90 Pleural effusion, not elsewhere classified: Secondary | ICD-10-CM | POA: Diagnosis not present

## 2022-01-16 DIAGNOSIS — E785 Hyperlipidemia, unspecified: Secondary | ICD-10-CM | POA: Insufficient documentation

## 2022-01-16 DIAGNOSIS — I6529 Occlusion and stenosis of unspecified carotid artery: Secondary | ICD-10-CM | POA: Insufficient documentation

## 2022-01-16 DIAGNOSIS — R42 Dizziness and giddiness: Secondary | ICD-10-CM | POA: Diagnosis not present

## 2022-01-16 DIAGNOSIS — I5022 Chronic systolic (congestive) heart failure: Secondary | ICD-10-CM | POA: Diagnosis not present

## 2022-01-16 DIAGNOSIS — I480 Paroxysmal atrial fibrillation: Secondary | ICD-10-CM | POA: Diagnosis not present

## 2022-01-16 DIAGNOSIS — I350 Nonrheumatic aortic (valve) stenosis: Secondary | ICD-10-CM | POA: Diagnosis not present

## 2022-01-16 DIAGNOSIS — Z79899 Other long term (current) drug therapy: Secondary | ICD-10-CM | POA: Diagnosis not present

## 2022-01-16 DIAGNOSIS — I214 Non-ST elevation (NSTEMI) myocardial infarction: Secondary | ICD-10-CM | POA: Diagnosis not present

## 2022-01-16 DIAGNOSIS — Z951 Presence of aortocoronary bypass graft: Secondary | ICD-10-CM | POA: Insufficient documentation

## 2022-01-16 DIAGNOSIS — I252 Old myocardial infarction: Secondary | ICD-10-CM | POA: Insufficient documentation

## 2022-01-16 DIAGNOSIS — E782 Mixed hyperlipidemia: Secondary | ICD-10-CM

## 2022-01-16 DIAGNOSIS — I951 Orthostatic hypotension: Secondary | ICD-10-CM | POA: Insufficient documentation

## 2022-01-16 DIAGNOSIS — I5032 Chronic diastolic (congestive) heart failure: Secondary | ICD-10-CM

## 2022-01-16 DIAGNOSIS — I2510 Atherosclerotic heart disease of native coronary artery without angina pectoris: Secondary | ICD-10-CM | POA: Insufficient documentation

## 2022-01-16 DIAGNOSIS — I253 Aneurysm of heart: Secondary | ICD-10-CM | POA: Diagnosis not present

## 2022-01-16 LAB — CBC
HCT: 43.6 % (ref 39.0–52.0)
Hemoglobin: 13.8 g/dL (ref 13.0–17.0)
MCH: 31 pg (ref 26.0–34.0)
MCHC: 31.7 g/dL (ref 30.0–36.0)
MCV: 98 fL (ref 80.0–100.0)
Platelets: 173 10*3/uL (ref 150–400)
RBC: 4.45 MIL/uL (ref 4.22–5.81)
RDW: 14.4 % (ref 11.5–15.5)
WBC: 8.3 10*3/uL (ref 4.0–10.5)
nRBC: 0 % (ref 0.0–0.2)

## 2022-01-16 LAB — BASIC METABOLIC PANEL
Anion gap: 6 (ref 5–15)
BUN: 26 mg/dL — ABNORMAL HIGH (ref 8–23)
CO2: 30 mmol/L (ref 22–32)
Calcium: 9.5 mg/dL (ref 8.9–10.3)
Chloride: 101 mmol/L (ref 98–111)
Creatinine, Ser: 1.35 mg/dL — ABNORMAL HIGH (ref 0.61–1.24)
GFR, Estimated: 50 mL/min — ABNORMAL LOW (ref >60)
Glucose, Bld: 105 mg/dL — ABNORMAL HIGH (ref 70–99)
Potassium: 4.7 mmol/L (ref 3.5–5.1)
Sodium: 137 mmol/L (ref 135–145)

## 2022-01-16 LAB — BRAIN NATRIURETIC PEPTIDE: B Natriuretic Peptide: 253.2 pg/mL — ABNORMAL HIGH (ref 0.0–100.0)

## 2022-01-16 MED ORDER — FUROSEMIDE 40 MG PO TABS: 20.0000 mg | ORAL_TABLET | ORAL | 3 refills | Status: DC | PRN

## 2022-01-16 NOTE — Patient Instructions (Addendum)
Thank you for coming in today ? ?Labs were done today, if any labs are abnormal the clinic will call you ?No news is good news  ? ?STOP Coreg  ? ?Your provider has recommended that  you wear a Zio Patch for 14 days.  This monitor will record your heart rhythm for our review.  IF you have any symptoms while wearing the monitor please press the button.  If you have any issues with the patch or you notice a red or orange light on it please call the company at [phone].  Once you remove the patch please mail it back to the company as soon as possible so we can get the results. ? ?Your physician recommends that you schedule a follow-up appointment in:  ?Keep follow up with Dr. Aundra Dubin as scheduled in May 2023 ? ?You had orthostatic blood pressures done today in the clinic ? ?CHANGE Lasix 20 mg daily as needed For weight gain of 3 lbs in 24 hours or 5 lbs in a week ? ?At the [facility], you and your health needs are our priority. As part of our continuing mission to provide you with exceptional heart care, we have created designated Provider Care Teams. These Care Teams include your primary Cardiologist (physician) and Advanced Practice Providers (APPs- Physician Assistants and Nurse Practitioners) who all work together to provide you with the care you need, when you need it.  ? ?You may see any of the following providers on your designated Care Team at your next follow up: ?Dr Glori Bickers ?Dr [name] ?Darrick Grinder, NP ?Lyda Jester, PA ?Jessica Milford,NP ?Marlyce Huge, PA ?Audry Riles, PharmD ? ? ?Please be sure to bring in all your medications bottles to every appointment.  ? ?If you have any questions or concerns before your next appointment please send [place] a message through [place] or call our office at [phone].   ? ?TO LEAVE A MESSAGE FOR THE NURSE SELECT OPTION 2, PLEASE LEAVE A MESSAGE INCLUDING: ?YOUR NAME ?DATE OF BIRTH ?CALL BACK NUMBER ?REASON FOR CALL**this is  important as we prioritize the call backs ? ?YOU WILL RECEIVE A CALL BACK THE SAME DAY AS LONG AS YOU CALL BEFORE 4:00 PM ? ?

## 2022-01-16 NOTE — Addendum Note (Signed)
Encounter addended by: Joette Catching, PA-C on: 01/16/2022 5:18 PM ? Actions taken: Clinical Note Signed

## 2022-02-04 DIAGNOSIS — I5032 Chronic diastolic (congestive) heart failure: Secondary | ICD-10-CM | POA: Diagnosis not present

## 2022-02-04 DIAGNOSIS — R42 Dizziness and giddiness: Secondary | ICD-10-CM | POA: Diagnosis not present

## 2022-02-05 NOTE — Addendum Note (Signed)
Encounter addended by: Micki Riley, RN on: 02/05/2022 11:10 AM ? Actions taken: Imaging Exam ended

## 2022-02-13 DIAGNOSIS — E785 Hyperlipidemia, unspecified: Secondary | ICD-10-CM | POA: Diagnosis not present

## 2022-02-13 DIAGNOSIS — E039 Hypothyroidism, unspecified: Secondary | ICD-10-CM | POA: Diagnosis not present

## 2022-02-13 DIAGNOSIS — Z125 Encounter for screening for malignant neoplasm of prostate: Secondary | ICD-10-CM | POA: Diagnosis not present

## 2022-02-13 DIAGNOSIS — I10 Essential (primary) hypertension: Secondary | ICD-10-CM | POA: Diagnosis not present

## 2022-02-13 DIAGNOSIS — E1122 Type 2 diabetes mellitus with diabetic chronic kidney disease: Secondary | ICD-10-CM | POA: Diagnosis not present

## 2022-02-18 ENCOUNTER — Ambulatory Visit (HOSPITAL_COMMUNITY)
Admission: RE | Admit: 2022-02-18 | Discharge: 2022-02-18 | Disposition: A | Discharge status: Home / Self Care | Payer: Medicare Other | Source: Ambulatory Visit | Attending: Cardiology | Admitting: Cardiology

## 2022-02-18 ENCOUNTER — Encounter (HOSPITAL_COMMUNITY): Payer: Self-pay | Admitting: Cardiology

## 2022-02-18 VITALS — BP 122/72 | HR 52 | Wt 211.0 lb

## 2022-02-18 DIAGNOSIS — Z79899 Other long term (current) drug therapy: Secondary | ICD-10-CM | POA: Diagnosis not present

## 2022-02-18 DIAGNOSIS — I350 Nonrheumatic aortic (valve) stenosis: Secondary | ICD-10-CM | POA: Insufficient documentation

## 2022-02-18 DIAGNOSIS — Z951 Presence of aortocoronary bypass graft: Secondary | ICD-10-CM | POA: Insufficient documentation

## 2022-02-18 DIAGNOSIS — Z8249 Family history of ischemic heart disease and other diseases of the circulatory system: Secondary | ICD-10-CM | POA: Diagnosis not present

## 2022-02-18 DIAGNOSIS — J90 Pleural effusion, not elsewhere classified: Secondary | ICD-10-CM | POA: Insufficient documentation

## 2022-02-18 DIAGNOSIS — R0789 Other chest pain: Secondary | ICD-10-CM | POA: Insufficient documentation

## 2022-02-18 DIAGNOSIS — I2510 Atherosclerotic heart disease of native coronary artery without angina pectoris: Secondary | ICD-10-CM | POA: Insufficient documentation

## 2022-02-18 DIAGNOSIS — I714 Abdominal aortic aneurysm, without rupture, unspecified: Secondary | ICD-10-CM | POA: Insufficient documentation

## 2022-02-18 DIAGNOSIS — Z7901 Long term (current) use of anticoagulants: Secondary | ICD-10-CM | POA: Diagnosis not present

## 2022-02-18 DIAGNOSIS — I5032 Chronic diastolic (congestive) heart failure: Secondary | ICD-10-CM | POA: Diagnosis not present

## 2022-02-18 DIAGNOSIS — I253 Aneurysm of heart: Secondary | ICD-10-CM | POA: Insufficient documentation

## 2022-02-18 DIAGNOSIS — I480 Paroxysmal atrial fibrillation: Secondary | ICD-10-CM | POA: Insufficient documentation

## 2022-02-18 DIAGNOSIS — I5022 Chronic systolic (congestive) heart failure: Secondary | ICD-10-CM | POA: Diagnosis not present

## 2022-02-18 DIAGNOSIS — E785 Hyperlipidemia, unspecified: Secondary | ICD-10-CM | POA: Diagnosis not present

## 2022-02-18 LAB — BASIC METABOLIC PANEL
Anion gap: 3 — ABNORMAL LOW (ref 5–15)
BUN: 20 mg/dL (ref 8–23)
CO2: 30 mmol/L (ref 22–32)
Calcium: 9.7 mg/dL (ref 8.9–10.3)
Chloride: 106 mmol/L (ref 98–111)
Creatinine, Ser: 1.15 mg/dL (ref 0.61–1.24)
GFR, Estimated: >60 mL/min (ref >60)
Glucose, Bld: 100 mg/dL — ABNORMAL HIGH (ref 70–99)
Potassium: 4.8 mmol/L (ref 3.5–5.1)
Sodium: 139 mmol/L (ref 135–145)

## 2022-02-18 LAB — LIPID PANEL
Cholesterol: 113 mg/dL (ref 0–200)
HDL: 39 mg/dL — ABNORMAL LOW (ref >40)
LDL Cholesterol: 62 mg/dL (ref 0–99)
Total CHOL/HDL Ratio: 2.9 RATIO
Triglycerides: 62 mg/dL (ref <150)
VLDL: 12 mg/dL (ref 0–40)

## 2022-02-18 NOTE — Patient Instructions (Signed)
There has been no changes to you medications.  Labs done today, your results will be available in MyChart, we will contact you for abnormal readings.  Your provider has ordered an Ultrasound for you. You will be called to arrange this appointment.  Your physician recommends that you schedule a follow-up appointment in: 3 months.  If you have any questions or concerns before your next appointment please send [place] a message through [place] or call our office at [phone].    TO LEAVE A MESSAGE FOR THE NURSE SELECT OPTION 2, PLEASE LEAVE A MESSAGE INCLUDING: YOUR NAME DATE OF BIRTH CALL BACK NUMBER REASON FOR CALL**this is important as we prioritize the call backs  YOU WILL RECEIVE A CALL BACK THE SAME DAY AS LONG AS YOU CALL BEFORE 4:00 PM  At the [facility], you and your health needs are our priority. As part of our continuing mission to provide you with exceptional heart care, we have created designated Provider Care Teams. These Care Teams include your primary Cardiologist (physician) and Advanced Practice Providers (APPs- Physician Assistants and Nurse Practitioners) who all work together to provide you with the care you need, when you need it.   You may see any of the following providers on your designated Care Team at your next follow up: Dr Glori Bickers Dr Haynes Kerns, NP Lyda Jester, [place] [facility], [place] Audry Riles, PharmD   Please be sure to bring in all your medications bottles to every appointment.

## 2022-02-18 NOTE — Progress Notes (Signed)
HDate:  02/18/2022   ID:  Eric Gordon, DOB [birth year], MRN [id number]   Provider location: [facility] Type of Visit: Established patient  PCP:  Shon Baton, MD  Cardiologist:  Dr. Aundra Dubin   History of Present Illness: Eric Gordon is a 88 y.o. male with history of CAD s/p CABG.  He has had trouble long-term with exertional dyspnea.  Dyspnea triggered evaluation in 2012 leading to CABG but was not resolved by CABG.  PFTs in 3/14 showed only mild obstructive airways disease and V/Q scan showed no PE.  Echo in 9/14 showed normal LV systolic function with moderately dilated RV. [place] in 2/15 showed normal right and left heart filling pressures and normal PA pressure.  Finally, he had a CPX in 3/15 that showed normal capacity compared to age-matched sedentary norms.  He was noted to have chronotropic incompetence.  At a prior appointment, I took him off metoprolol given chronotropic incompetence noted on CPX.  Dyspnea improved significantly with weight loss.  He had Cardiolite in 8/18 with EF 52%, no ischemia or infarction.    He was admitted in 1/20 with NSTEMI.  LHC showed new occlusion of the branch of SVG-ramus and OM that touched down on OM.  There were also serial 70% stenoses in the mid/distal RCA.  There was not thought to be an interventional option and patient was treated medically. He was discharged home but presented a couple days later with recurrent chest pain and dyspnea.  Cath was repeated showing no change from prior.  This admission, he had FFR of the RCA which did not suggest hemodynamic significance.  Echo showed EF 55-60%, normal RV.  CTA did not show a PE. He was noted to be volume overloaded and was diuresed.  He was also noted to be in atrial fibrillation with RVR transiently.  ASA/Plavix was stopped and Eliquis was started. He was discharged to SNF.  Atrial fibrillation recurred and he was started on amiodarone with conversion back to NSR.    In 4/20, he was admitted with left spontaneous PTX requiring chest tube.  After this, he developed recurrent left pleural effusions requiring thoracentesis x 3 so far.  Pleural fluid was exudative with eosinophil predominance.  With elevated ESR, there was concern that amiodarone could be involved, so this medication was stopped.  CT chest in 6/20 showed LLL consolidation/collapse with small left effusion, there was a rim-enhancing lesion posterior to the heart between esophagus and left atrium, ?loculated fluid.  TEE was done in 7/20 and confirmed loculated pleural effusion behind the left atrium.  In 7/20, patient had VATS on the left.   Carotid dopplers in 9/21 showed 80-99% RICA stenosis, 73-41% LICA.  AAA [place] in 9/21 showed 4.5 cm AAA. Patient was referred to Dr. Trula Slade for evaluation => he has had TCAR on right in 12/21.  He had left TCAR in 5/22.   Cardiolite in 12/21 with EF 41%, no ischemia, fixed inferolateral defect. Echo in 1/22 showed EF 40-45%, basal to mid inferolateral AK, basal inferior HK, normal RV, mild aortic stenosis mean gradient 11 mmHg.  He was unable to tolerate dapagliflozin and is off it. He was lightheaded on low dose Entresto so this was stopped.   Zio monitor in 1/23 showed 25% atrial flutter.  He was referred to EP, by the time he saw EP, burden of atrial fibrillation was lower.   Cardiolite in 3/23 showed prior inferolateral and inferior infarct, no ischemia.  Patient was admitted in 4/23 with atypical chest pain.  This likely was due to cough/bronchitis.  CTA chest showed inferolateral wall pseudoaneurysm, this is chronic.  Echo in 4/23 showed EF 40-45%, akinesis of the inferolateral wall, basal inferolateral pseudoaneurysm, moderate RV enlargement, normal RV systolic function, mild AS with mean gradient 10 mmHg.   Zio monitor in 5/23 showed 2% AF burden, improved from prior.     Patient returns for followup of dyspnea and CAD.   Rare palpitations.  He has  stable dyspnea with moderate to heavy exertion.  He is now off Coreg due to orthostatic symptoms and feels better.  No lightheadedness.  He has chronic, atypical chest pain.  Weight is up a bit but he reports some dietary indiscretion recently.    REDS clip 22%   Labs (2/15): K 4.2, creatinine 1.2, LDL 87, HDL 39, BNP 71 Labs (4/15): K 3.9, creatinine 1.1 Labs (12/15): LDL 73, HDL 37, K 3.9, creatinine 1.0 Labs (12/16): LDL 71, HDL 49 Labs (3/17): K 4.2, creatinine 1.28, HCT 45 Labs (8/18): LDL 82, HDL 41, TSH normal, hgb 16.3, K 4.1, creatinine 1.05 Labs (2/20): LFTs normal, pro BNP 842 Labs (3/20): K 3.8, creatinine 1.48, TSH mildly elevated at 6.97, free T3 and free T4 normal Labs (4/20): K 3.9, creatinine 1.36 Labs (2/20): LDL 52 Labs (8/20): BNP 241, creatinine 1.21  Labs (9/20): BNP 244, K 4, creatinine 1.12 Labs (2/21): K 4.3, creatinine 1.26, BNP 271 Labs (6/21): K 4.2, creatinine 1.11, LDL 57 Labs (6/22): K 4.3, creatinine 1.16, LDL 49 Labs (8/22): K 4.3, creatinine 1.37 Labs (12/22): K 3.9, creatinine 1.18 Labs (4/23): K 4.7, creatinine 1.35, BNP 253   PMH: 1. Essential tremor 2. CAD: s/p CABG in 3/12.   - Cardiolite (8/18): EF 52%, no ischemia/infarction.  - NSTEMI (1/20):  LHC showed new occlusion of the branch of SVG-ramus and OM that touched down on OM.  There were also serial 70% stenoses in the mid/distal RCA.  FFR of RCA was negative.  - Cardiolite (12/21): EF 41%, no ischemia, fixed inferolateral defect - Cardiolite (3/23): LV EF 37%, fixed basal-mid inferolateral/inferior defect, no ischemia.  3. Atrial fibrillation: Only noted post-op CABG.  4. PE: Post-op CABG in 2012.  5. AAA: 3.6 cm on [place] in 3/14.  3.6 cm on [place] in 3/15. 3.6 cm on [place] 8/16. 4.1 cm on [place] 4/17.  - AAA [place] (5/18): 4.1 cm AAA.  - AAA [place] (5/19): 4.1 cm AAA.  - AAA [place] (4/20): 4.1 cm AAA.  - AAA [place] (9/21): 4.5 cm AAA - AAA [place] (9/22): 4.5 cm AAA 6. OSA: On CPAP.  7. Carotid stenosis: Carotid  dopplers (2/45) with 80-99% LICA stenosis.  Carotid dopplers (8/33) with 82-50% LICA stenosis. Carotid dopplers (3/16) with 60-79% RCIA stenosis, 53-97% LICA stenosis.  - Carotid dopplers (8/16) with 40-59% RICA stenosis, 67-34% LICA stenosis.  - Carotid dopplers (8/17) with 19-37% RICA, 90-24% LICA. - Carotid dopplers (9/18) with 09-73% RICA, 53-29% LICA.   - Carotid dopplers (9/19) with 92-42% RICA, 68-34% LICA.  - Carotid dopplers (9/20) with 60-79% BICA - Carotid dopplers (9/21): 19-62% RICA, 22-97% LICA - Right TCAR 98/92, left TCAR 5/22.  - Carotid dopplers (4/23): patent ICA stents.  8. Asthma 9. PNA x 2 10. Chronic diastolic CHF/dyspnea: Echo (9/14) with EF 60-65%, mild LVH, very mild AS with mean gradient 10 mmHg, RV moderately dilated.  PFTs (3/14) showed mild obstructive airways disease.  V/Q scan (3/14) with no  PE.  ENT workup for upper respiratory causes was negative.  RHC (2/15) with mean RA 7, PA 32/12, mean PCWP 13, CI 3.18.  CPX (3/15) with peak VO2 16.4, VE/VCO2 33; normal when compared to age-matched sedentary normals; chronotropic incompetence was noted.  Dyspnea was improved with weight loss.  - Echo (8/17): EF 60-65%, mild AS.  - Echo (1/20): EF 55-60%, normal RV size and systolic function.  - Echo (6/20): EF 55%, mild LVH, mild AS, normal RV size and systolic function, ?LA mass or mass impinging on posterior LA.  - TEE (7/20): EF 50-55%, mild LVH, basal inferolateral aneurysm, normal RV size/systolic function, loculated pleural fluid behind LA.  - Echo (1/22): EF 40-45%, basal to mid inferolateral AK, basal inferior HK, normal RV, mild aortic stenosis mean gradient 11 mmHg. - Echo (2/23): EF 45-50%, moderate LVH, normal RV, mild AS mean gradient 15 mmHg with IVC normal.  - Echo (4/23): EF 40-45%, akinesis of the inferolateral wall, basal inferolateral pseudoaneurysm, moderate RV enlargement, normal RV systolic function, mild AS with mean gradient 10 mmHg.  11. HTN 12.  Aortic stenosis: Mild.  13. Ascending aortic aneurysm: CTA chest with 4.4 cm ascending aorta in 1/20.  - 4.2 cm ascending aorta on CT chest 6/20.  14. Atrial fibrillation: Paroxysmal.  15. CKD: Stage 3.  16. Left lung spontaneous PTX then recurrent left pleural effusion.  - Left VATS in 7/20.  17. Palpitations:  - Zio patch (6/20): NSR with 1 short NSVT run and few short SVT runs, no atrial fibrillation.  - Zio patch 2 wks (2/21): 6 short NSVT runs, 66 short SVT runs, rare PACs/PVCs.  - Zio patch (5/23): 2% AF burden, 4 NSVT runs longest 5 beats.  29. COVID-19 infection 4/22.  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   Current Outpatient Medications  Medication Sig Dispense Refill   acetaminophen (TYLENOL) 500 MG tablet Take 1,000 mg by mouth every 6 (six) hours as needed for moderate pain or headache.     apixaban (ELIQUIS) 5 MG TABS tablet Take 5 mg by mouth 2 (two) times daily.     Ascorbic Acid 500 MG CAPS Take 500 mg by mouth daily.     atorvastatin (LIPITOR) 80 MG tablet Take 1 tablet (80 mg total) by mouth daily. 30 tablet 6   cholecalciferol (VITAMIN D) 1000 UNITS tablet Take 1,000 Units by mouth every evening.     fexofenadine (ALLEGRA) 180 MG tablet Take 180 mg by mouth daily.     furosemide (LASIX) 40 MG tablet Take 0.5 tablets (20 mg total) by mouth as needed. 30 tablet 3   isosorbide mononitrate (IMDUR) 30 MG 24 hr tablet Take 1 tablet (30 mg total) by mouth daily.     levothyroxine (SYNTHROID, LEVOTHROID) 50 MCG tablet Take 50 mcg by mouth daily before breakfast.     Multiple Vitamins-Minerals (CENTRUM SILVER PO) Take 1 tablet by mouth daily.      pantoprazole (PROTONIX) 40 MG tablet Take 40 mg by mouth daily.      spironolactone (ALDACTONE) 25 MG tablet Take 1 tablet (25 mg total) by mouth daily. 90 tablet 3   tamsulosin (FLOMAX) 0.4 MG CAPS capsule Take 0.4 mg by mouth daily after supper.      traMADol (ULTRAM) 50 MG tablet Take 1 tablet (50 mg total) by mouth every 6  (six) hours as needed for moderate pain. 20 tablet 0   valsartan (DIOVAN) 40 MG tablet Take 0.5 tablets (20 mg total) by mouth 2 (two)  times daily. 30 tablet 3   No current facility-administered medications for this encounter.    Allergies:   Ancef [cefazolin sodium] and Vancomycin   Social History:  The patient  reports that he quit smoking about 38 years ago. His smoking use included cigarettes. He has a 35.00 pack-year smoking history. He has never used smokeless tobacco. He reports current alcohol use of about 2.0 standard drinks per week. He reports that he does not use drugs.   Family History:  The patient's family history includes Coronary artery disease in his mother; Heart disease in his mother; Hypertension in his mother.   ROS:  Please see the history of present illness.   All other systems are personally reviewed and negative.   Exam:   BP 122/72   Pulse (!) 52   Wt 95.7 kg (211 lb)   SpO2 97%   BMI 26.03 kg/m  General: NAD Neck: No JVD, no thyromegaly or thyroid nodule.  Lungs: Clear to auscultation bilaterally with normal respiratory effort. CV: Nondisplaced PMI.  Heart regular S1/S2, no S3/S4, 2/6 SEM RUSB.  No peripheral edema.  No carotid bruit.  Normal pedal pulses.  Abdomen: Soft, nontender, no hepatosplenomegaly, no distention.  Skin: Intact without lesions or rashes.  Neurologic: Alert and oriented x 3.  Psych: Normal affect. Extremities: No clubbing or cyanosis.  HEENT: Normal.   Recent Labs: 12/31/2021: ALT 17 01/16/2022: B Natriuretic Peptide 253.2; Hemoglobin 13.8; Platelets 173 02/18/2022: BUN 20; Creatinine, Ser 1.15; Potassium 4.8; Sodium 139  Personally reviewed   Wt Readings from Last 3 Encounters:  02/18/22 95.7 kg (211 lb)  01/16/22 92.9 kg (204 lb 12.8 oz)  01/06/22 92.1 kg (203 lb)     ASSESSMENT AND PLAN:  1. CAD: s/p CABG 3/12. NSTEMI 1/20, LHC showed new occlusion of the branch of SVG-ramus and OM that touched down on OM.  There were  also serial 70% stenoses in the mid/distal RCA.  FFR of RCA was negative.  No interventional target.  Cardiolite in 12/21 showed inferolateral infarction with no ischemia, consistent with findings on NSTEMI in 1/20.  Cardiolite in 3/23 showed inferior/inferolateral fixed defect with no ischemia.  He continues to have occasional atypical chest pain.   - No ASA given Eliquis use.  - Continue Imdur 30 mg daily.  - Continue atorvastatin 80 daily.  Check lipids today.  - Off ranolazine with prolonged QTc.  2. Hyperlipidemia: On atorvastatin 80 daily - Check lipids today.  3. Chronic HF with mid range EF: TEE in 7/20 showed EF 50-55%, basal inferolateral aneurysm, and normal RV.  Echo in 1/22 with EF 40-45%, basal-mid inferolateral akinesis and inferior hypokinesis, normal RV.  Echo was stable in 4/23 with EF 40-45%, akinesis of the inferolateral wall, basal inferolateral pseudoaneurysm, moderate RV enlargement, normal RV systolic function, mild AS with mean gradient 10 mmHg. He is not volume overloaded on exam or by REDS clip (22%).  NYHA class II, I think that his dyspnea is primarily related to his left lung disease (mild and stable).  He was unable to tolerate dapagliflozin and became lightheaded/hypotensive with Entresto.  Coreg had to be stopped due to orthostasis.  - Continue valsartan 20 mg bid.  - Continue spironolactone 25 mg daily. BMET today.  - He has Lasix for prn use, does not appear to need it.  4. Spontaneous left PTX with recurrent left-sided pleural effusion:  He is s/p left VATS/pleurodesis in 7/20.    5. Carotid stenosis: S/p bilateral TCARs, followed  at [facility].     6. AAA: 4.5 cm AAA in 9/22, I will arrange for repeat AAA [place].      7. Aortic stenosis: Mild on 4/23 echo.   8. Atrial fibrillation: Paroxysmal. He is significantly symptomatic while in atrial fibrillation.  He is in NSR today.  He is off amiodarone due to concern for pulmonary toxicity.  He saw EP, did not recommend ablation.  He would likely be a Tikosyn or sotalol candidate but would have to come into the hospital for it.  As he has had minimal atrial fibrillation recently, hold off Tikosyn/sotalol for now. NSR today.  - Continue Eliquis 5 mg bid.  9. Pseudoaneurysm: Chronic PSA basal inferolateral wall.  This has been stable, not candidate for surgery.   Recommended follow-up:  3 months  Signed, [name], MD  02/18/2022  [facility] [address] [facility] [place] [address] [phone] (office) [phone] (fax)

## 2022-02-18 NOTE — Progress Notes (Signed)
ReDS Vest / Clip - 02/18/22 1000       ReDS Vest / Clip   Station Marker D    Ruler Value 36    ReDS Value Range Low volume    ReDS Actual Value 22

## 2022-02-20 DIAGNOSIS — E039 Hypothyroidism, unspecified: Secondary | ICD-10-CM | POA: Diagnosis not present

## 2022-02-20 DIAGNOSIS — R82998 Other abnormal findings in urine: Secondary | ICD-10-CM | POA: Diagnosis not present

## 2022-02-20 DIAGNOSIS — I2510 Atherosclerotic heart disease of native coronary artery without angina pectoris: Secondary | ICD-10-CM | POA: Diagnosis not present

## 2022-02-20 DIAGNOSIS — Z0000 Encounter for general adult medical examination without abnormal findings: Secondary | ICD-10-CM | POA: Diagnosis not present

## 2022-02-20 DIAGNOSIS — I6529 Occlusion and stenosis of unspecified carotid artery: Secondary | ICD-10-CM | POA: Diagnosis not present

## 2022-02-20 DIAGNOSIS — N401 Enlarged prostate with lower urinary tract symptoms: Secondary | ICD-10-CM | POA: Diagnosis not present

## 2022-02-20 DIAGNOSIS — E1122 Type 2 diabetes mellitus with diabetic chronic kidney disease: Secondary | ICD-10-CM | POA: Diagnosis not present

## 2022-02-20 DIAGNOSIS — M1990 Unspecified osteoarthritis, unspecified site: Secondary | ICD-10-CM | POA: Diagnosis not present

## 2022-02-20 DIAGNOSIS — E785 Hyperlipidemia, unspecified: Secondary | ICD-10-CM | POA: Diagnosis not present

## 2022-02-20 DIAGNOSIS — I5022 Chronic systolic (congestive) heart failure: Secondary | ICD-10-CM | POA: Diagnosis not present

## 2022-02-20 DIAGNOSIS — I130 Hypertensive heart and chronic kidney disease with heart failure and stage 1 through stage 4 chronic kidney disease, or unspecified chronic kidney disease: Secondary | ICD-10-CM | POA: Diagnosis not present

## 2022-02-20 DIAGNOSIS — G252 Other specified forms of tremor: Secondary | ICD-10-CM | POA: Diagnosis not present

## 2022-02-20 DIAGNOSIS — N1831 Chronic kidney disease, stage 3a: Secondary | ICD-10-CM | POA: Diagnosis not present

## 2022-02-22 ENCOUNTER — Other Ambulatory Visit: Payer: Self-pay

## 2022-02-22 ENCOUNTER — Emergency Department (HOSPITAL_COMMUNITY): Payer: Medicare Other

## 2022-02-22 ENCOUNTER — Encounter (HOSPITAL_COMMUNITY): Payer: Self-pay | Admitting: Internal Medicine

## 2022-02-22 ENCOUNTER — Inpatient Hospital Stay (HOSPITAL_COMMUNITY)
Admission: EM | Admit: 2022-02-22 | Discharge: 2022-02-26 | DRG: 226 | Disposition: A | Discharge status: Home Health | Payer: Medicare Other | Attending: Internal Medicine | Admitting: Internal Medicine

## 2022-02-22 DIAGNOSIS — R0902 Hypoxemia: Secondary | ICD-10-CM | POA: Diagnosis not present

## 2022-02-22 DIAGNOSIS — I350 Nonrheumatic aortic (valve) stenosis: Secondary | ICD-10-CM | POA: Diagnosis present

## 2022-02-22 DIAGNOSIS — I255 Ischemic cardiomyopathy: Secondary | ICD-10-CM | POA: Diagnosis not present

## 2022-02-22 DIAGNOSIS — I25810 Atherosclerosis of coronary artery bypass graft(s) without angina pectoris: Secondary | ICD-10-CM | POA: Diagnosis not present

## 2022-02-22 DIAGNOSIS — E860 Dehydration: Secondary | ICD-10-CM | POA: Diagnosis present

## 2022-02-22 DIAGNOSIS — I7143 Infrarenal abdominal aortic aneurysm, without rupture: Secondary | ICD-10-CM | POA: Diagnosis present

## 2022-02-22 DIAGNOSIS — I10 Essential (primary) hypertension: Secondary | ICD-10-CM | POA: Diagnosis not present

## 2022-02-22 DIAGNOSIS — Z79890 Hormone replacement therapy (postmenopausal): Secondary | ICD-10-CM

## 2022-02-22 DIAGNOSIS — M50321 Other cervical disc degeneration at C4-C5 level: Secondary | ICD-10-CM | POA: Diagnosis not present

## 2022-02-22 DIAGNOSIS — S20219A Contusion of unspecified front wall of thorax, initial encounter: Secondary | ICD-10-CM

## 2022-02-22 DIAGNOSIS — K219 Gastro-esophageal reflux disease without esophagitis: Secondary | ICD-10-CM | POA: Diagnosis present

## 2022-02-22 DIAGNOSIS — Z95810 Presence of automatic (implantable) cardiac defibrillator: Secondary | ICD-10-CM | POA: Diagnosis not present

## 2022-02-22 DIAGNOSIS — Z043 Encounter for examination and observation following other accident: Secondary | ICD-10-CM | POA: Diagnosis not present

## 2022-02-22 DIAGNOSIS — I110 Hypertensive heart disease with heart failure: Secondary | ICD-10-CM | POA: Diagnosis not present

## 2022-02-22 DIAGNOSIS — Z86711 Personal history of pulmonary embolism: Secondary | ICD-10-CM

## 2022-02-22 DIAGNOSIS — I4729 Other ventricular tachycardia: Secondary | ICD-10-CM | POA: Diagnosis not present

## 2022-02-22 DIAGNOSIS — M7989 Other specified soft tissue disorders: Secondary | ICD-10-CM | POA: Diagnosis not present

## 2022-02-22 DIAGNOSIS — I2510 Atherosclerotic heart disease of native coronary artery without angina pectoris: Secondary | ICD-10-CM | POA: Diagnosis present

## 2022-02-22 DIAGNOSIS — I447 Left bundle-branch block, unspecified: Secondary | ICD-10-CM | POA: Diagnosis present

## 2022-02-22 DIAGNOSIS — R079 Chest pain, unspecified: Secondary | ICD-10-CM | POA: Diagnosis not present

## 2022-02-22 DIAGNOSIS — I714 Abdominal aortic aneurysm, without rupture, unspecified: Secondary | ICD-10-CM | POA: Diagnosis present

## 2022-02-22 DIAGNOSIS — S0191XA Laceration without foreign body of unspecified part of head, initial encounter: Secondary | ICD-10-CM

## 2022-02-22 DIAGNOSIS — N400 Enlarged prostate without lower urinary tract symptoms: Secondary | ICD-10-CM | POA: Diagnosis present

## 2022-02-22 DIAGNOSIS — I472 Ventricular tachycardia, unspecified: Secondary | ICD-10-CM | POA: Diagnosis not present

## 2022-02-22 DIAGNOSIS — M159 Polyosteoarthritis, unspecified: Secondary | ICD-10-CM | POA: Diagnosis present

## 2022-02-22 DIAGNOSIS — I739 Peripheral vascular disease, unspecified: Secondary | ICD-10-CM

## 2022-02-22 DIAGNOSIS — E785 Hyperlipidemia, unspecified: Secondary | ICD-10-CM | POA: Diagnosis not present

## 2022-02-22 DIAGNOSIS — R918 Other nonspecific abnormal finding of lung field: Secondary | ICD-10-CM | POA: Diagnosis not present

## 2022-02-22 DIAGNOSIS — I452 Bifascicular block: Secondary | ICD-10-CM | POA: Diagnosis present

## 2022-02-22 DIAGNOSIS — Y92017 Garden or yard in single-family (private) house as the place of occurrence of the external cause: Secondary | ICD-10-CM

## 2022-02-22 DIAGNOSIS — I495 Sick sinus syndrome: Principal | ICD-10-CM | POA: Diagnosis present

## 2022-02-22 DIAGNOSIS — G4733 Obstructive sleep apnea (adult) (pediatric): Secondary | ICD-10-CM | POA: Diagnosis present

## 2022-02-22 DIAGNOSIS — W1830XA Fall on same level, unspecified, initial encounter: Secondary | ICD-10-CM | POA: Diagnosis present

## 2022-02-22 DIAGNOSIS — G8929 Other chronic pain: Secondary | ICD-10-CM | POA: Diagnosis present

## 2022-02-22 DIAGNOSIS — Z888 Allergy status to other drugs, medicaments and biological substances status: Secondary | ICD-10-CM

## 2022-02-22 DIAGNOSIS — Z7901 Long term (current) use of anticoagulants: Secondary | ICD-10-CM

## 2022-02-22 DIAGNOSIS — N281 Cyst of kidney, acquired: Secondary | ICD-10-CM | POA: Diagnosis not present

## 2022-02-22 DIAGNOSIS — N179 Acute kidney failure, unspecified: Secondary | ICD-10-CM | POA: Diagnosis not present

## 2022-02-22 DIAGNOSIS — S199XXA Unspecified injury of neck, initial encounter: Secondary | ICD-10-CM | POA: Diagnosis not present

## 2022-02-22 DIAGNOSIS — M545 Low back pain, unspecified: Secondary | ICD-10-CM | POA: Diagnosis present

## 2022-02-22 DIAGNOSIS — I4892 Unspecified atrial flutter: Secondary | ICD-10-CM | POA: Diagnosis not present

## 2022-02-22 DIAGNOSIS — I253 Aneurysm of heart: Secondary | ICD-10-CM | POA: Diagnosis not present

## 2022-02-22 DIAGNOSIS — I252 Old myocardial infarction: Secondary | ICD-10-CM

## 2022-02-22 DIAGNOSIS — I5042 Chronic combined systolic (congestive) and diastolic (congestive) heart failure: Secondary | ICD-10-CM | POA: Diagnosis not present

## 2022-02-22 DIAGNOSIS — D696 Thrombocytopenia, unspecified: Secondary | ICD-10-CM | POA: Diagnosis present

## 2022-02-22 DIAGNOSIS — Z79899 Other long term (current) drug therapy: Secondary | ICD-10-CM

## 2022-02-22 DIAGNOSIS — Z23 Encounter for immunization: Secondary | ICD-10-CM

## 2022-02-22 DIAGNOSIS — E039 Hypothyroidism, unspecified: Secondary | ICD-10-CM | POA: Diagnosis not present

## 2022-02-22 DIAGNOSIS — S0003XA Contusion of scalp, initial encounter: Secondary | ICD-10-CM | POA: Diagnosis not present

## 2022-02-22 DIAGNOSIS — I4819 Other persistent atrial fibrillation: Secondary | ICD-10-CM | POA: Diagnosis present

## 2022-02-22 DIAGNOSIS — I480 Paroxysmal atrial fibrillation: Secondary | ICD-10-CM | POA: Diagnosis not present

## 2022-02-22 DIAGNOSIS — Z87891 Personal history of nicotine dependence: Secondary | ICD-10-CM

## 2022-02-22 DIAGNOSIS — R55 Syncope and collapse: Secondary | ICD-10-CM | POA: Diagnosis not present

## 2022-02-22 DIAGNOSIS — I700 Atherosclerosis of aorta: Secondary | ICD-10-CM | POA: Diagnosis present

## 2022-02-22 DIAGNOSIS — I440 Atrioventricular block, first degree: Secondary | ICD-10-CM | POA: Diagnosis present

## 2022-02-22 DIAGNOSIS — I5043 Acute on chronic combined systolic (congestive) and diastolic (congestive) heart failure: Secondary | ICD-10-CM | POA: Diagnosis present

## 2022-02-22 DIAGNOSIS — K5730 Diverticulosis of large intestine without perforation or abscess without bleeding: Secondary | ICD-10-CM | POA: Diagnosis not present

## 2022-02-22 DIAGNOSIS — S0101XA Laceration without foreign body of scalp, initial encounter: Secondary | ICD-10-CM | POA: Diagnosis present

## 2022-02-22 DIAGNOSIS — L538 Other specified erythematous conditions: Secondary | ICD-10-CM | POA: Diagnosis not present

## 2022-02-22 DIAGNOSIS — I4891 Unspecified atrial fibrillation: Secondary | ICD-10-CM | POA: Diagnosis not present

## 2022-02-22 DIAGNOSIS — M50322 Other cervical disc degeneration at C5-C6 level: Secondary | ICD-10-CM | POA: Diagnosis not present

## 2022-02-22 DIAGNOSIS — E7800 Pure hypercholesterolemia, unspecified: Secondary | ICD-10-CM | POA: Diagnosis present

## 2022-02-22 DIAGNOSIS — Z881 Allergy status to other antibiotic agents status: Secondary | ICD-10-CM

## 2022-02-22 DIAGNOSIS — Z8249 Family history of ischemic heart disease and other diseases of the circulatory system: Secondary | ICD-10-CM

## 2022-02-22 LAB — CBC WITH DIFFERENTIAL/PLATELET
Abs Immature Granulocytes: 0.05 10*3/uL (ref 0.00–0.07)
Basophils Absolute: 0.1 10*3/uL (ref 0.0–0.1)
Basophils Relative: 1 %
Eosinophils Absolute: 0.1 10*3/uL (ref 0.0–0.5)
Eosinophils Relative: 1 %
HCT: 40.5 % (ref 39.0–52.0)
Hemoglobin: 12.8 g/dL — ABNORMAL LOW (ref 13.0–17.0)
Immature Granulocytes: 1 %
Lymphocytes Relative: 12 %
Lymphs Abs: 1.2 10*3/uL (ref 0.7–4.0)
MCH: 31.8 pg (ref 26.0–34.0)
MCHC: 31.6 g/dL (ref 30.0–36.0)
MCV: 100.7 fL — ABNORMAL HIGH (ref 80.0–100.0)
Monocytes Absolute: 1 10*3/uL (ref 0.1–1.0)
Monocytes Relative: 10 %
Neutro Abs: 7.3 10*3/uL (ref 1.7–7.7)
Neutrophils Relative %: 75 %
Platelets: 145 10*3/uL — ABNORMAL LOW (ref 150–400)
RBC: 4.02 MIL/uL — ABNORMAL LOW (ref 4.22–5.81)
RDW: 14.4 % (ref 11.5–15.5)
WBC: 9.7 10*3/uL (ref 4.0–10.5)
nRBC: 0 % (ref 0.0–0.2)

## 2022-02-22 LAB — HEPATIC FUNCTION PANEL
ALT: 14 U/L (ref 0–44)
AST: 25 U/L (ref 15–41)
Albumin: 3.7 g/dL (ref 3.5–5.0)
Alkaline Phosphatase: 71 U/L (ref 38–126)
Bilirubin, Direct: 0.2 mg/dL (ref 0.0–0.2)
Indirect Bilirubin: 0.4 mg/dL (ref 0.3–0.9)
Total Bilirubin: 0.6 mg/dL (ref 0.3–1.2)
Total Protein: 6.6 g/dL (ref 6.5–8.1)

## 2022-02-22 LAB — I-STAT CHEM 8, ED
BUN: 37 mg/dL — ABNORMAL HIGH (ref 8–23)
Calcium, Ion: 1.06 mmol/L — ABNORMAL LOW (ref 1.15–1.40)
Chloride: 108 mmol/L (ref 98–111)
Creatinine, Ser: 1.9 mg/dL — ABNORMAL HIGH (ref 0.61–1.24)
Glucose, Bld: 106 mg/dL — ABNORMAL HIGH (ref 70–99)
HCT: 39 % (ref 39.0–52.0)
Hemoglobin: 13.3 g/dL (ref 13.0–17.0)
Potassium: 4 mmol/L (ref 3.5–5.1)
Sodium: 141 mmol/L (ref 135–145)
TCO2: 23 mmol/L (ref 22–32)

## 2022-02-22 LAB — BASIC METABOLIC PANEL
Anion gap: 8 (ref 5–15)
BUN: 31 mg/dL — ABNORMAL HIGH (ref 8–23)
CO2: 27 mmol/L (ref 22–32)
Calcium: 9.8 mg/dL (ref 8.9–10.3)
Chloride: 107 mmol/L (ref 98–111)
Creatinine, Ser: 1.68 mg/dL — ABNORMAL HIGH (ref 0.61–1.24)
GFR, Estimated: 39 mL/min — ABNORMAL LOW (ref >60)
Glucose, Bld: 98 mg/dL (ref 70–99)
Potassium: 4.2 mmol/L (ref 3.5–5.1)
Sodium: 142 mmol/L (ref 135–145)

## 2022-02-22 LAB — TROPONIN I (HIGH SENSITIVITY): Troponin I (High Sensitivity): 14 ng/L (ref <18)

## 2022-02-22 MED ORDER — LIDOCAINE-EPINEPHRINE (PF) 2 %-1:200000 IJ SOLN
20.0000 mL | Freq: Once | INTRAMUSCULAR | Status: AC
  Administered 2022-02-22: 20 mL
  Filled 2022-02-22: qty 20

## 2022-02-22 MED ORDER — LEVOTHYROXINE SODIUM 50 MCG PO TABS: 50.0000 ug | ORAL_TABLET | Freq: Every day | ORAL | Status: DC

## 2022-02-22 MED ORDER — TAMSULOSIN HCL 0.4 MG PO CAPS
0.4000 mg | ORAL_CAPSULE | Freq: Every day | ORAL | Status: DC
  Administered 2022-02-22 – 2022-02-23 (×2): 0.4 mg via ORAL
  Filled 2022-02-22 (×2): qty 1

## 2022-02-22 MED ORDER — POLYETHYLENE GLYCOL 3350 17 G PO PACK: 17.0000 g | PACK | Freq: Every day | ORAL | Status: DC | PRN

## 2022-02-22 MED ORDER — MORPHINE SULFATE (PF) 4 MG/ML IV SOLN
4.0000 mg | Freq: Once | INTRAVENOUS | Status: AC
  Administered 2022-02-22: 4 mg via INTRAVENOUS
  Filled 2022-02-22: qty 1

## 2022-02-22 MED ORDER — PROCHLORPERAZINE EDISYLATE 10 MG/2ML IJ SOLN: 10.0000 mg | Freq: Four times a day (QID) | INTRAMUSCULAR | Status: DC | PRN

## 2022-02-22 MED ORDER — ACETAMINOPHEN 325 MG PO TABS
650.0000 mg | ORAL_TABLET | Freq: Four times a day (QID) | ORAL | Status: DC | PRN
  Administered 2022-02-23 (×2): 650 mg via ORAL
  Filled 2022-02-22 (×2): qty 2

## 2022-02-22 MED ORDER — ONDANSETRON HCL 4 MG/2ML IJ SOLN
4.0000 mg | Freq: Once | INTRAMUSCULAR | Status: AC
  Administered 2022-02-22: 4 mg via INTRAVENOUS
  Filled 2022-02-22: qty 2

## 2022-02-22 MED ORDER — OXYCODONE HCL 5 MG PO TABS
5.0000 mg | ORAL_TABLET | Freq: Four times a day (QID) | ORAL | Status: DC | PRN
  Administered 2022-02-22 – 2022-02-23 (×2): 5 mg via ORAL
  Filled 2022-02-22 (×2): qty 1

## 2022-02-22 MED ORDER — LEVOTHYROXINE SODIUM 75 MCG PO TABS
75.0000 ug | ORAL_TABLET | Freq: Every day | ORAL | Status: DC
  Administered 2022-02-23 – 2022-02-26 (×4): 75 ug via ORAL
  Filled 2022-02-22 (×4): qty 1

## 2022-02-22 MED ORDER — HYDROMORPHONE HCL 1 MG/ML IJ SOLN
0.5000 mg | INTRAMUSCULAR | Status: DC | PRN
  Administered 2022-02-22 – 2022-02-23 (×3): 0.5 mg via INTRAVENOUS
  Filled 2022-02-22 (×3): qty 0.5

## 2022-02-22 MED ORDER — MELATONIN 3 MG PO TABS
3.0000 mg | ORAL_TABLET | Freq: Every evening | ORAL | Status: DC | PRN
  Administered 2022-02-22 – 2022-02-25 (×3): 3 mg via ORAL
  Filled 2022-02-22 (×3): qty 1

## 2022-02-22 MED ORDER — PANTOPRAZOLE SODIUM 40 MG PO TBEC
40.0000 mg | DELAYED_RELEASE_TABLET | Freq: Every day | ORAL | Status: DC
  Administered 2022-02-23 – 2022-02-26 (×4): 40 mg via ORAL
  Filled 2022-02-22 (×4): qty 1

## 2022-02-22 MED ORDER — IOHEXOL 300 MG/ML  SOLN
100.0000 mL | Freq: Once | INTRAMUSCULAR | Status: AC | PRN
  Administered 2022-02-22: 100 mL via INTRAVENOUS

## 2022-02-22 MED ORDER — ATORVASTATIN CALCIUM 80 MG PO TABS
80.0000 mg | ORAL_TABLET | Freq: Every day | ORAL | Status: DC
  Administered 2022-02-23 – 2022-02-26 (×4): 80 mg via ORAL
  Filled 2022-02-22 (×2): qty 1
  Filled 2022-02-22 (×2): qty 2

## 2022-02-22 MED ORDER — TETANUS-DIPHTH-ACELL PERTUSSIS 5-2.5-18.5 LF-MCG/0.5 IM SUSY
0.5000 mL | PREFILLED_SYRINGE | Freq: Once | INTRAMUSCULAR | Status: AC
Start: 2022-02-22 — End: 2022-02-22
  Administered 2022-02-22: 0.5 mL via INTRAMUSCULAR
  Filled 2022-02-22: qty 0.5

## 2022-02-22 NOTE — H&P (Addendum)
History and Physical  TERRIS GERMANO VWU:[id number] DOB: 1930/10/20 DOA: 02/22/2022  Referring physician: Dr. Tyrone Nine- EDP  PCP: Shon Baton, MD  Outpatient Specialists: Cardiology Patient coming from: Home.  Chief Complaint: I passed out  HPI: Eric Gordon is a 88 y.o. male with medical history significant for paroxysmal A-fib on Eliquis, combined diastolic and systolic CHF with LVEF 29-56% grade 1 diastolic dysfunction, AAA status postrepair, bilateral carotid stenosis status post stenting, large left ventricle pseudoaneurysm, hypertension, hyperlipidemia, hypothyroidism, BPH, recently admitted in April 2023 where he was found to have a large left ventricular pseudoaneurysm for which cardiology and cardiothoracic surgery were consulted.  The patient presented to [facility] ED from home after a syncopal episode while working on his deck.  Initial felt weak and sat down.  After he felt better, he got up and passed out.  No chest pain prior to falling.  The patient was in his usual state of health prior to this.  He fell backward and hit his head and his chest.  He immediately started having head pain and chest wall pain.    He was brought into the ED for further evaluation.  CT head revealed left parieto-occipital scalp hematoma with laceration and skin staples, placed in the ED.  No acute intracranial abnormality.  No skull fracture.  CT chest abdomen pelvis with contrast revealed no evidence of acute traumatic injury to the chest abdomen or pelvis.  Pseudoaneurysm of the inferior left ventricular wall measuring 3.6 cm, unchanged from prior CT no associated stranding.  Small chronic left pleural effusion.  Enlarged prostate gland causing mild mass effect on the bladder base.  Infrarenal abdominal aortic aneurysm, 5 cm.  Recommend follow-up every 6 months and vascular consultation.  Aortic atherosclerosis.  TRH, hospitalist service, was asked to admit for further syncope work-up.  ED Course: Tmax  97.5.  BP 132/69, pulse 79, respiratory 20, with saturation 100% on room air.  Lab studies remarkable for BUN 37, creatinine 1.90, hemoglobin 13.3, WBC 9.7.  Chest x-ray no active disease.  Review of Systems: Review of systems as noted in the HPI. All other systems reviewed and are negative.   Past Medical History:  Diagnosis Date   Abdominal aortic aneurysm (AAA) (HCC)    Achilles tendinitis of right lower extremity    Arthritis    "my whole body" (03/19/2018)   Atherosclerosis of coronary artery bypass graft w/o angina pectoris    Atrial fibrillation (HCC)    Barrett's esophagus    CAD (coronary artery disease)    Carotid artery disease (HCC)    CHF (congestive heart failure) (HCC)    Chronic anticoagulation    PE   Chronic lower back pain    Dyspnea    Essential tremor    GERD (gastroesophageal reflux disease)    High cholesterol    Hyperlipidemia    Hyperplasia, prostate    Hypertension    Hypothyroidism    Myocardial infarction ([place])    2 in Jan. 2019   Nail dystrophy    OSA on CPAP    uses CPAP   Osteoarthrosis    Pneumonia    "now and once before" (03/19/2018)   Pulmonary embolism [facility])    April 2012 after CABG   S/P CABG (coronary artery bypass graft)    Sleep apnea    Spontaneous pneumothorax    left spontaneous pneumothorax/left pleural effusion, s/p chest tube 01/09/19; thoracentesis x2, s/p left VATS/tacl pleurodesis 04/12/19   Past Surgical History:  Procedure  Laterality Date   ACHILLES TENDON REPAIR Bilateral    2006 & 2004   CARDIAC CATHETERIZATION  11/2010   CORONARY ANGIOGRAPHY N/A 10/22/2018   Procedure: CORONARY ANGIOGRAPHY;  Surgeon: Troy Sine, MD;  Location: [place] [facility];  Service: Cardiovascular;  Laterality: N/A;   CORONARY ARTERY BYPASS GRAFT  March 2012   CABG X 5   ESOPHAGOGASTRODUODENOSCOPY (EGD) WITH PROPOFOL N/A 12/10/2020   Procedure: ESOPHAGOGASTRODUODENOSCOPY (EGD) WITH PROPOFOL;  Surgeon: Doran Stabler, MD;  Location:  [facility] ENDOSCOPY;  Service: Gastroenterology;  Laterality: N/A;   INGUINAL HERNIA REPAIR Left 1980   INTRAVASCULAR PRESSURE WIRE/FFR STUDY N/A 10/22/2018   Procedure: INTRAVASCULAR PRESSURE WIRE/FFR STUDY;  Surgeon: Troy Sine, MD;  Location: [place] [facility];  Service: Cardiovascular;  Laterality: N/A;   IR THORACENTESIS ASP PLEURAL SPACE W/IMG GUIDE  02/04/2019   IR THORACENTESIS ASP PLEURAL SPACE W/IMG GUIDE  02/24/2019   KNEE ARTHROSCOPY Left 2006   LEFT HEART CATH AND CORS/GRAFTS ANGIOGRAPHY N/A 10/18/2018   Procedure: LEFT HEART CATH AND CORS/GRAFTS ANGIOGRAPHY;  Surgeon: Lorretta Harp, MD;  Location: [place] [facility];  Service: Cardiovascular;  Laterality: N/A;   LEFT HEART CATH AND CORS/GRAFTS ANGIOGRAPHY N/A 10/21/2018   Procedure: LEFT HEART CATH AND CORS/GRAFTS ANGIOGRAPHY;  Surgeon: Troy Sine, MD;  Location: [place] [facility];  Service: Cardiovascular;  Laterality: N/A;   PENILE PROSTHESIS IMPLANT     PLEURAL BIOPSY Left 04/12/2019   Procedure: Pleural Biopsy;  Surgeon: Grace Isaac, MD;  Location: [place];  Service: Thoracic;  Laterality: Left;   PLEURAL EFFUSION DRAINAGE Left 04/12/2019   Procedure: Drainage Of Pleural Effusion;  Surgeon: Grace Isaac, MD;  Location: [place];  Service: Thoracic;  Laterality: Left;   RIGHT HEART CATHETERIZATION N/A 11/14/2013   Procedure: RIGHT HEART CATH;  Surgeon: Larey Dresser, MD;  Location: [facility] CATH LAB;  Service: Cardiovascular;  Laterality: N/A;   SHOULDER OPEN ROTATOR CUFF REPAIR Right 2003   TALC PLEURODESIS Left 04/12/2019   Procedure: Pietro Cassis;  Surgeon: Grace Isaac, MD;  Location: [place];  Service: Thoracic;  Laterality: Left;   TEE WITHOUT CARDIOVERSION N/A 03/24/2019   Procedure: TRANSESOPHAGEAL ECHOCARDIOGRAM (TEE);  Surgeon: Larey Dresser, MD;  Location: [facility] ENDOSCOPY;  Service: Cardiovascular;  Laterality: N/A;   TEE WITHOUT CARDIOVERSION  04/12/2019   Procedure: Transesophageal Echocardiogram (Tee);   Surgeon: Grace Isaac, MD;  Location: [facility] OR;  Service: Thoracic;;   THORACOSCOPY Left 04/12/2019   VIDEO BRONCHOSCOPY (N/A ) VIDEO ASSISTED THORACOSCOPY (Left Chest) TALC PLEURADESIS (Left    TRANSCAROTID ARTERY REVASCULARIZATION  Right 09/13/2020   Procedure: RIGHT TRANSCAROTID ARTERY REVASCULARIZATION USING 38m X 455mENROUTE TRANSCAROTID STEND SYSTEM;  Surgeon: BrSerafina MitchellMD;  [facility];  Laterality: Right;   TRANSCAROTID ARTERY REVASCULARIZATION  Left 02/08/2021   Procedure: LEFT TRANSCAROTID ARTERY REVASCULARIZATION;  Surgeon: BrSerafina MitchellMD;  [facility] Service: Vascular;  Laterality: Left;   ULTRASOUND GUIDANCE FOR VASCULAR ACCESS  09/13/2020   Procedure: ULTRASOUND GUIDANCE FOR VASCULAR ACCESS;  Surgeon: BrSerafina MitchellMD;  [facility] Service: Vascular;;   VIDEO ASSISTED THORACOSCOPY Left 04/12/2019   Procedure: VIDEO ASSISTED THORACOSCOPY;  Surgeon: GeGrace IsaacMD;  [facility] Service: Thoracic;  Laterality: Left;   VIDEO BRONCHOSCOPY N/A 04/12/2019   Procedure: VIDEO BRONCHOSCOPY;  Surgeon: GeGrace IsaacMD;  [facility] Service: Thoracic;  Laterality: N/A;  Social History:  reports that he quit smoking about 38 years ago. His smoking use included cigarettes. He has a 35.00 pack-year smoking history. He has never used smokeless tobacco. He reports current alcohol use of about 2.0 standard drinks per week. He reports that he does not use drugs.   Allergies  Allergen Reactions   Ancef [Cefazolin Sodium] Hives   Empagliflozin Other (See Comments)    Dizziness   Vancomycin Rash    Family History  Problem Relation Age of Onset   Coronary artery disease Mother    Hypertension Mother    Heart disease Mother       Prior to Admission medications   Medication Sig Start Date End Date Taking? Authorizing Provider  acetaminophen (TYLENOL) 500 MG tablet Take 1,000 mg by mouth every 6 (six) hours  as needed for moderate pain or headache.    [provider]  apixaban (ELIQUIS) 5 MG TABS tablet Take 5 mg by mouth 2 (two) times daily.    [provider]  Ascorbic Acid 500 MG CAPS Take 500 mg by mouth daily.    [provider]  atorvastatin (LIPITOR) 80 MG tablet Take 1 tablet (80 mg total) by mouth daily. 11/01/18   Larey Dresser, MD  cholecalciferol (VITAMIN D) 1000 UNITS tablet Take 1,000 Units by mouth every evening.    [provider]  fexofenadine (ALLEGRA) 180 MG tablet Take 180 mg by mouth daily.    [provider]  furosemide (LASIX) 40 MG tablet Take 0.5 tablets (20 mg total) by mouth as needed. 01/16/22   Joette Catching, PA-C  isosorbide mononitrate (IMDUR) 30 MG 24 hr tablet Take 1 tablet (30 mg total) by mouth daily. 02/26/21   Larey Dresser, MD  levothyroxine (SYNTHROID, LEVOTHROID) 50 MCG tablet Take 50 mcg by mouth daily before breakfast.    [provider]  Multiple Vitamins-Minerals (CENTRUM SILVER PO) Take 1 tablet by mouth daily.     [provider]  pantoprazole (PROTONIX) 40 MG tablet Take 40 mg by mouth daily.     [provider]  spironolactone (ALDACTONE) 25 MG tablet Take 1 tablet (25 mg total) by mouth daily. 11/15/21   Larey Dresser, MD  tamsulosin (FLOMAX) 0.4 MG CAPS capsule Take 0.4 mg by mouth daily after supper.     [provider]  traMADol (ULTRAM) 50 MG tablet Take 1 tablet (50 mg total) by mouth every 6 (six) hours as needed for moderate pain. 02/09/21   Dagoberto Ligas, PA-C  valsartan (DIOVAN) 40 MG tablet Take 0.5 tablets (20 mg total) by mouth 2 (two) times daily. 05/28/21   Larey Dresser, MD    Physical Exam: BP 135/73   Pulse 79   Resp (!) 21   SpO2 100%   General: 88 y.o. year-old male well developed well nourished in no acute distress.  Alert and oriented x3. Cardiovascular: Regular rate and rhythm with no rubs or gallops.  No thyromegaly or JVD noted.   No lower extremity edema. 2/4 pulses in all 4 extremities. Respiratory: Clear to auscultation with no wheezes or rales. Good inspiratory effort. Abdomen: Soft nontender nondistended with normal bowel sounds x4 quadrants. Muskuloskeletal: No cyanosis, clubbing or edema noted bilaterally Neuro: CN II-XII intact, strength, sensation, reflexes Skin: Left posterior scalp laceration with staples in place. Psychiatry: Judgement and insight appear normal. Mood is appropriate for condition and setting          Labs on Admission:  Basic  Metabolic Panel: Recent Labs  Lab 02/18/22 0920 02/22/22 1649 02/22/22 1656  NA 139 142 141  K 4.8 4.2 4.0  CL 106 107 108  CO2 30 27  --   GLUCOSE 100* 98 106*  BUN 20 31* 37*  CREATININE 1.15 1.68* 1.90*  CALCIUM 9.7 9.8  --    Liver Function Tests: Recent Labs  Lab 02/22/22 1649  AST 25  ALT 14  ALKPHOS 71  BILITOT 0.6  PROT 6.6  ALBUMIN 3.7   No results for input(s): LIPASE, AMYLASE in the last 168 hours. No results for input(s): AMMONIA in the last 168 hours. CBC: Recent Labs  Lab 02/22/22 1649 02/22/22 1656  WBC 9.7  --   NEUTROABS 7.3  --   HGB 12.8* 13.3  HCT 40.5 39.0  MCV 100.7*  --   PLT 145*  --    Cardiac Enzymes: No results for input(s): CKTOTAL, CKMB, CKMBINDEX, TROPONINI in the last 168 hours.  BNP (last 3 results) Recent Labs    11/15/21 1301 12/31/21 0902 01/16/22 1124  BNP 331.5* 341.1* 253.2*    ProBNP (last 3 results) No results for input(s): PROBNP in the last 8760 hours.  CBG: No results for input(s): GLUCAP in the last 168 hours.  Radiological Exams on Admission: CT Head Wo Contrast  Result Date: 02/22/2022 CLINICAL DATA:  Fall, head trauma. EXAM: CT HEAD WITHOUT CONTRAST TECHNIQUE: Contiguous axial images were obtained from the base of the skull through the vertex without intravenous contrast. RADIATION DOSE REDUCTION: This exam was performed according to the departmental dose-optimization program  which includes automated exposure control, adjustment of the mA and/or kV according to patient size and/or use of iterative reconstruction technique. COMPARISON:  None Available. FINDINGS: Brain: Brain volume is normal for age. No intracranial hemorrhage, mass effect, or midline shift. No hydrocephalus. The basilar cisterns are patent. No evidence of territorial infarct or acute ischemia. No extra-axial or intracranial fluid collection. Vascular: Atherosclerosis of skullbase vasculature without hyperdense vessel or abnormal calcification. Skull: No fracture or focal lesion. Sinuses/Orbits: Paranasal sinuses and mastoid air cells are clear. The visualized orbits are unremarkable. Other: Left parietooccipital scalp hematoma with laceration and skin staples. IMPRESSION: Left parietooccipital scalp hematoma with laceration and skin staples. No acute intracranial abnormality. No skull fracture. Electronically Signed   By: Keith Rake M.D.   On: 02/22/2022 18:20   CT Cervical Spine Wo Contrast  Result Date: 02/22/2022 CLINICAL DATA:  Neck trauma, fall. EXAM: CT CERVICAL SPINE WITHOUT CONTRAST TECHNIQUE: Multidetector CT imaging of the cervical spine was performed without intravenous contrast. Multiplanar CT image reconstructions were also generated. RADIATION DOSE REDUCTION: This exam was performed according to the departmental dose-optimization program which includes automated exposure control, adjustment of the mA and/or kV according to patient size and/or use of iterative reconstruction technique. COMPARISON:  None Available. FINDINGS: Alignment: Normal. Skull base and vertebrae: No acute fracture. Vertebral body heights are maintained. The dens and skull base are intact. Incidental note of non fusion of the posterior arch of C1, variant anatomy. Soft tissues and spinal canal: No prevertebral fluid or swelling. No visible canal hematoma. Disc levels:  Mild degenerative disc disease C4-C5 and C5-C6. Upper chest:  Cyst on concurrent chest CT, reported separately. Other: Bilateral carotid stents. IMPRESSION: Mild degenerative disc disease. No acute fracture or subluxation of the cervical spine. Electronically Signed   By: Keith Rake M.D.   On: 02/22/2022 18:17   CT CHEST ABDOMEN PELVIS W CONTRAST  Result Date: 02/22/2022  CLINICAL DATA:  88 year old post fall. EXAM: CT CHEST, ABDOMEN, AND PELVIS WITH CONTRAST TECHNIQUE: Multidetector CT imaging of the chest, abdomen and pelvis was performed following the standard protocol during bolus administration of intravenous contrast. RADIATION DOSE REDUCTION: This exam was performed according to the departmental dose-optimization program which includes automated exposure control, adjustment of the mA and/or kV according to patient size and/or use of iterative reconstruction technique. CONTRAST:  14m OMNIPAQUE IOHEXOL 300 MG/ML  SOLN COMPARISON:  Chest radiograph earlier today.  Chest CTA 12/31/2021 FINDINGS: CT CHEST FINDINGS Cardiovascular: No evidence of acute aortic or vascular injury. Moderate aortic atherosclerosis. No aortic aneurysm. Post CABG with dense calcification of native coronary arteries. Mild cardiomegaly. Pseudoaneurysm arising from the inferior left ventricular wall measures 3.6 cm, unchanged from prior CT, no associated stranding. Mitral annulus calcifications. No pericardial effusion. Mediastinum/Nodes: No mediastinal hemorrhage. No adenopathy. Patulous esophagus. No pneumomediastinum. Lungs/Pleura: Small chronic left pleural effusion with sequela of prior talc pleurodesis, high density along the pleural surface at the left lung base. There is associated scarring in the left lower lobe. Chronic fluid tracking into the inter lobar fissure on the left. No pneumothorax or pulmonary contusion. Chronic biapical pleuroparenchymal scarring. No acute airspace disease. Musculoskeletal: Prior median sternotomy, no sternal fracture. Diffuse thoracic spondylosis, no  acute thoracic spine fracture. No acute fracture of the ribs, included clavicles or shoulder girdles. There is no confluent chest wall hematoma. CT ABDOMEN PELVIS FINDINGS Hepatobiliary: No hepatic injury or perihepatic hematoma. There are multiple scattered tiny hepatic hypodensities are too small to accurately characterize but likely represent small cysts or biliary hamartomas. Gallbladder is unremarkable. Pancreas: No injury. Fatty atrophy of the head and uncinate process. No ductal dilatation or inflammation. Spleen: No splenic injury or perisplenic hematoma. Adrenals/Urinary Tract: No adrenal hemorrhage or renal injury identified. There are bilateral renal cysts that need no further follow-up. Bladder is unremarkable. Stomach/Bowel: Ingested material within the stomach with equivocal gastric wall thickening, nontraumatic. Small mural lipoma within the duodenum, nonobstructing. There is no evidence of bowel injury or mesenteric hematoma. Colonic diverticulosis without diverticulitis. The sigmoid colon is redundant. The appendix is not confidently visualized. Vascular/Lymphatic: No aortic or vascular injury. Moderate aortic atherosclerosis. 5 cm infrarenal aortic aneurysm without periaortic stranding or evidence of rupture. There is no retroperitoneal fluid or stranding. No adenopathy. Reproductive: Enlarged prostate gland causing mild mass effect on the bladder base. Penile prosthesis with decompressed pump in the midline. Other: No free air or free fluid. There is no confluent body wall contusion. Small bilateral fat containing inguinal hernias. Musculoskeletal: No acute fracture of the pelvis. Moderate lumbar degenerative change without acute fracture. IMPRESSION: 1. No evidence of acute traumatic injury to the chest, abdomen, or pelvis. 2. Small chronic left pleural effusion with sequela of prior talc pleurodesis, high density along the pleural surface at the left lung base. 3. Pseudoaneurysm of the inferior  left ventricular wall measuring 3.6 cm, unchanged from prior CT, no associated stranding. 4. Colonic diverticulosis without diverticulitis. 5. Enlarged prostate gland causing mild mass effect on the bladder base. 6. Infrarenal abdominal aortic aneurysm, 5 cm. Recommend follow-up every 6 months and vascular consultation. This recommendation follows ACR consensus guidelines: White Paper of the ACR Incidental Findings Committee II on Vascular Findings. J Am Coll Radiol 2013; 10:789-794. Aortic Atherosclerosis (ICD10-I70.0). Electronically Signed   By: MKeith RakeM.D.   On: 02/22/2022 18:15   DG Chest Port 1 View  Result Date: 02/22/2022 CLINICAL DATA:  Chest pain. EXAM: PORTABLE CHEST 1  VIEW COMPARISON:  December 31, 2021 FINDINGS: Chronic mild opacity in left base, likely scarring. Stable cardiomegaly. The hila and mediastinum are unchanged. Sternotomy wires are intact. No pneumothorax. No nodules or masses. IMPRESSION: No active disease. Electronically Signed   By: Dorise Bullion III M.D.   On: 02/22/2022 16:14    EKG: I independently viewed the EKG done and my findings are as followed: Sinus rhythm rate of 59.  Nonspecific ST-T changes.  QTc 486.  RBBB.  Assessment/Plan Present on Admission:  Syncope  Principal Problem:   Syncope  Syncope, unclear etiology. Significant cardiac history.  Including large left ventricle pseudoaneurysm measuring 3.6 cm. Obtain orthostatic vital signs daily, today and tomorrow. Obtain 2D echo PT/OT evaluation in the a.m., with fall precautions. Closely monitor on telemetry Cardiology consulted.  Large left ventricle pseudoaneurysm measuring 3.6 cm We will hold off on diuretics for now until seen by cardiology Recent 2D echo in April 2023 We will obtain limited echocardiogram to look for any acute changes  AKI, suspect prerenal in the setting of dehydration, diuresis Baseline creatinine appears to be 1.1 with GFR greater than 60 Presented with creatinine  1.68 with GFR of 39 Hold off home diuretics for now Avoid nephrotoxic agents and hypotension Closely monitor urine output Repeat renal panel in the morning  Mild thrombocytopenia Platelet count 145 Monitor for now  Chronic combined diastolic and systolic CHF Last 2D echo done on 01/09/2022 showed LVEF 40 to 45% and grade 1 diastolic dysfunction Closely monitor strict I's and O's and daily weight Holding off diuretics for now. Follow repeat limited 2D echo to rule out any acute cardiac structural abnormalities.  Paroxysmal A-fib on Eliquis Rate is currently controlled, 59 Defer restarting Eliquis to cardiology, Cardiologist Fellow Dr. Renella Cunas will see in consultation. Closely monitor on telemetry  Laceration of scalp, initial encounter Post staples placement in the ED. Continue local wound care Analgesics as needed  Hypothyroidism/hypertension/hyperlipidemia/GERD/BPH Stable Resume home regimen.   DVT prophylaxis: SCDs  Code Status: Full code  Family Communication: None at bedside  Disposition Plan: Admitted to telemetry cardiac unit  Consults called: Cardiology  Admission status: Observation status   Status is: Observation    Kayleen Memos MD [facility] Pager [phone]  If 7PM-7AM, please contact night-coverage [email or web address] Password [email or web address]  02/22/2022, 7:25 PM

## 2022-02-22 NOTE — ED Notes (Signed)
Patient transported to CT 

## 2022-02-22 NOTE — ED Notes (Signed)
CT head delayed due to delay in labwork.  Pt is difficult stick

## 2022-02-22 NOTE — Progress Notes (Signed)
Pt refusing cpap for tonight, will start tomorrow

## 2022-02-22 NOTE — Progress Notes (Signed)
Orthopedic Tech Progress Note Patient Details:  Eric Gordon [birth year] [id number] Level 2 Trauma  Patient ID: [name], male   DOB: 06-Jul-1931, 88 y.o.   MRN: [id number]  Jearld Lesch 02/22/2022, 3:44 PM

## 2022-02-22 NOTE — ED Provider Notes (Addendum)
[facility] Provider Note   CSN: [id number] Arrival date & time: 02/22/22  1523     History  No chief complaint on file.   Eric Gordon is a 88 y.o. male.  88 yo M with a chief complaints of a fall.  The patient tells me that he has been working on his deck this morning and suddenly woke up on the ground.  He denies any prodrome.  And struck his head and his chest.  Complaining of headache and chest pain.  Denies abdominal pain denies back pain denies extremity pain.  He is on Eliquis.       Home Medications Prior to Admission medications   Medication Sig Start Date End Date Taking? Authorizing Provider  acetaminophen (TYLENOL) 500 MG tablet Take 1,000 mg by mouth every 6 (six) hours as needed for moderate pain or headache.    [provider]  apixaban (ELIQUIS) 5 MG TABS tablet Take 5 mg by mouth 2 (two) times daily.    [provider]  Ascorbic Acid 500 MG CAPS Take 500 mg by mouth daily.    [provider]  atorvastatin (LIPITOR) 80 MG tablet Take 1 tablet (80 mg total) by mouth daily. 11/01/18   Larey Dresser, MD  cholecalciferol (VITAMIN D) 1000 UNITS tablet Take 1,000 Units by mouth every evening.    [provider]  fexofenadine (ALLEGRA) 180 MG tablet Take 180 mg by mouth daily.    [provider]  furosemide (LASIX) 40 MG tablet Take 0.5 tablets (20 mg total) by mouth as needed. 01/16/22   Joette Catching, PA-C  isosorbide mononitrate (IMDUR) 30 MG 24 hr tablet Take 1 tablet (30 mg total) by mouth daily. 02/26/21   Larey Dresser, MD  levothyroxine (SYNTHROID, LEVOTHROID) 50 MCG tablet Take 50 mcg by mouth daily before breakfast.    [provider]  Multiple Vitamins-Minerals (CENTRUM SILVER PO) Take 1 tablet by mouth daily.     [provider]  pantoprazole (PROTONIX) 40 MG tablet Take 40 mg by mouth daily.     [provider]  spironolactone  (ALDACTONE) 25 MG tablet Take 1 tablet (25 mg total) by mouth daily. 11/15/21   Larey Dresser, MD  tamsulosin (FLOMAX) 0.4 MG CAPS capsule Take 0.4 mg by mouth daily after supper.     [provider]  traMADol (ULTRAM) 50 MG tablet Take 1 tablet (50 mg total) by mouth every 6 (six) hours as needed for moderate pain. 02/09/21   Dagoberto Ligas, PA-C  valsartan (DIOVAN) 40 MG tablet Take 0.5 tablets (20 mg total) by mouth 2 (two) times daily. 05/28/21   Larey Dresser, MD      Allergies    Ancef [cefazolin sodium], Empagliflozin, and Vancomycin    Review of Systems   Review of Systems  Physical Exam Updated Vital Signs BP 135/73   Pulse 79   Resp (!) 21   SpO2 100%  Physical Exam Vitals and nursing note reviewed.  Constitutional:      Appearance: He is well-developed.  HENT:     Head: Normocephalic.     Comments: Laceration to the left side of the occiput approximately 5.5 cm in length.  No palpable skull fracture.  No obvious midline C-spine tenderness but has significant pain with range of motion. Eyes:     Pupils: Pupils are equal, round, and reactive to light.  Neck:     Vascular: No JVD.  Cardiovascular:     Rate and Rhythm: Normal rate and regular rhythm.     Heart sounds: No murmur heard.   No friction rub. No gallop.  Pulmonary:     Effort: No respiratory distress.     Breath sounds: No wheezing.  Abdominal:     General: There is no distension.     Tenderness: There is abdominal tenderness. There is guarding. There is no rebound.     Comments: Guarding mostly about the upper portion of the abdomen.  Pain with elevation of the left upper quadrant.  Musculoskeletal:        General: Tenderness present. Normal range of motion.     Cervical back: Normal range of motion and neck supple.     Comments: Pain about the mid thoracic back and diffusely across the anterior chest.  Pain in the left upper quadrant.  Skin:    Coloration: Skin is not pale.     Findings:  No rash.  Neurological:     Mental Status: He is alert and oriented to person, place, and time.  Psychiatric:        Behavior: Behavior normal.    ED Results / Procedures / Treatments   Labs (all labs ordered are listed, but only abnormal results are displayed) Labs Reviewed  CBC WITH DIFFERENTIAL/PLATELET - Abnormal; Notable for the following components:      Result Value   RBC 4.02 (*)    Hemoglobin 12.8 (*)    MCV 100.7 (*)    Platelets 145 (*)    All other components within normal limits  BASIC METABOLIC PANEL - Abnormal; Notable for the following components:   BUN 31 (*)    Creatinine, Ser 1.68 (*)    GFR, Estimated 39 (*)    All other components within normal limits  I-STAT CHEM 8, ED - Abnormal; Notable for the following components:   BUN 37 (*)    Creatinine, Ser 1.90 (*)    Glucose, Bld 106 (*)    Calcium, Ion 1.06 (*)    All other components within normal limits  HEPATIC FUNCTION PANEL  CBC WITH DIFFERENTIAL/PLATELET  COMPREHENSIVE METABOLIC PANEL  MAGNESIUM  PHOSPHORUS  CBG MONITORING, ED  TROPONIN I (HIGH SENSITIVITY)    EKG EKG Interpretation  Date/Time:  Saturday February 22 2022 15:39:04 EDT Ventricular Rate:  59 PR Interval:  111 QRS Duration: 176 QT Interval:  490 QTC Calculation: 486 R Axis:   257 Text Interpretation: Sinus rhythm Atrial premature complexes Borderline short PR interval RBBB and LAFB No significant change since last tracing Confirmed by Deno Etienne [phone]) on 02/22/2022 3:47:15 PM  Radiology CT Head Wo Contrast  Result Date: 02/22/2022 CLINICAL DATA:  Fall, head trauma. EXAM: CT HEAD WITHOUT CONTRAST TECHNIQUE: Contiguous axial images were obtained from the base of the skull through the vertex without intravenous contrast. RADIATION DOSE REDUCTION: This exam was performed according to the departmental dose-optimization program which includes automated exposure control, adjustment of the mA and/or kV according to patient size and/or use of  iterative reconstruction technique. COMPARISON:  None Available. FINDINGS: Brain: Brain volume is normal for age. No intracranial hemorrhage, mass effect, or midline shift. No hydrocephalus. The basilar cisterns are patent. No evidence of territorial infarct or acute ischemia. No extra-axial or intracranial fluid collection. Vascular: Atherosclerosis of skullbase vasculature without hyperdense vessel or abnormal calcification. Skull: No fracture or focal lesion. Sinuses/Orbits: Paranasal sinuses and mastoid air cells are clear. The visualized orbits are unremarkable. Other: Left parietooccipital  scalp hematoma with laceration and skin staples. IMPRESSION: Left parietooccipital scalp hematoma with laceration and skin staples. No acute intracranial abnormality. No skull fracture. Electronically Signed   By: Keith Rake M.D.   On: 02/22/2022 18:20   CT Cervical Spine Wo Contrast  Result Date: 02/22/2022 CLINICAL DATA:  Neck trauma, fall. EXAM: CT CERVICAL SPINE WITHOUT CONTRAST TECHNIQUE: Multidetector CT imaging of the cervical spine was performed without intravenous contrast. Multiplanar CT image reconstructions were also generated. RADIATION DOSE REDUCTION: This exam was performed according to the departmental dose-optimization program which includes automated exposure control, adjustment of the mA and/or kV according to patient size and/or use of iterative reconstruction technique. COMPARISON:  None Available. FINDINGS: Alignment: Normal. Skull base and vertebrae: No acute fracture. Vertebral body heights are maintained. The dens and skull base are intact. Incidental note of non fusion of the posterior arch of C1, variant anatomy. Soft tissues and spinal canal: No prevertebral fluid or swelling. No visible canal hematoma. Disc levels:  Mild degenerative disc disease C4-C5 and C5-C6. Upper chest: Cyst on concurrent chest CT, reported separately. Other: Bilateral carotid stents. IMPRESSION: Mild degenerative  disc disease. No acute fracture or subluxation of the cervical spine. Electronically Signed   By: Keith Rake M.D.   On: 02/22/2022 18:17   CT CHEST ABDOMEN PELVIS W CONTRAST  Result Date: 02/22/2022 CLINICAL DATA:  88 year old post fall. EXAM: CT CHEST, ABDOMEN, AND PELVIS WITH CONTRAST TECHNIQUE: Multidetector CT imaging of the chest, abdomen and pelvis was performed following the standard protocol during bolus administration of intravenous contrast. RADIATION DOSE REDUCTION: This exam was performed according to the departmental dose-optimization program which includes automated exposure control, adjustment of the mA and/or kV according to patient size and/or use of iterative reconstruction technique. CONTRAST:  121m OMNIPAQUE IOHEXOL 300 MG/ML  SOLN COMPARISON:  Chest radiograph earlier today.  Chest CTA 12/31/2021 FINDINGS: CT CHEST FINDINGS Cardiovascular: No evidence of acute aortic or vascular injury. Moderate aortic atherosclerosis. No aortic aneurysm. Post CABG with dense calcification of native coronary arteries. Mild cardiomegaly. Pseudoaneurysm arising from the inferior left ventricular wall measures 3.6 cm, unchanged from prior CT, no associated stranding. Mitral annulus calcifications. No pericardial effusion. Mediastinum/Nodes: No mediastinal hemorrhage. No adenopathy. Patulous esophagus. No pneumomediastinum. Lungs/Pleura: Small chronic left pleural effusion with sequela of prior talc pleurodesis, high density along the pleural surface at the left lung base. There is associated scarring in the left lower lobe. Chronic fluid tracking into the inter lobar fissure on the left. No pneumothorax or pulmonary contusion. Chronic biapical pleuroparenchymal scarring. No acute airspace disease. Musculoskeletal: Prior median sternotomy, no sternal fracture. Diffuse thoracic spondylosis, no acute thoracic spine fracture. No acute fracture of the ribs, included clavicles or shoulder girdles. There is no  confluent chest wall hematoma. CT ABDOMEN PELVIS FINDINGS Hepatobiliary: No hepatic injury or perihepatic hematoma. There are multiple scattered tiny hepatic hypodensities are too small to accurately characterize but likely represent small cysts or biliary hamartomas. Gallbladder is unremarkable. Pancreas: No injury. Fatty atrophy of the head and uncinate process. No ductal dilatation or inflammation. Spleen: No splenic injury or perisplenic hematoma. Adrenals/Urinary Tract: No adrenal hemorrhage or renal injury identified. There are bilateral renal cysts that need no further follow-up. Bladder is unremarkable. Stomach/Bowel: Ingested material within the stomach with equivocal gastric wall thickening, nontraumatic. Small mural lipoma within the duodenum, nonobstructing. There is no evidence of bowel injury or mesenteric hematoma. Colonic diverticulosis without diverticulitis. The sigmoid colon is redundant. The appendix is not confidently visualized. Vascular/Lymphatic:  No aortic or vascular injury. Moderate aortic atherosclerosis. 5 cm infrarenal aortic aneurysm without periaortic stranding or evidence of rupture. There is no retroperitoneal fluid or stranding. No adenopathy. Reproductive: Enlarged prostate gland causing mild mass effect on the bladder base. Penile prosthesis with decompressed pump in the midline. Other: No free air or free fluid. There is no confluent body wall contusion. Small bilateral fat containing inguinal hernias. Musculoskeletal: No acute fracture of the pelvis. Moderate lumbar degenerative change without acute fracture. IMPRESSION: 1. No evidence of acute traumatic injury to the chest, abdomen, or pelvis. 2. Small chronic left pleural effusion with sequela of prior talc pleurodesis, high density along the pleural surface at the left lung base. 3. Pseudoaneurysm of the inferior left ventricular wall measuring 3.6 cm, unchanged from prior CT, no associated stranding. 4. Colonic  diverticulosis without diverticulitis. 5. Enlarged prostate gland causing mild mass effect on the bladder base. 6. Infrarenal abdominal aortic aneurysm, 5 cm. Recommend follow-up every 6 months and vascular consultation. This recommendation follows ACR consensus guidelines: White Paper of the ACR Incidental Findings Committee II on Vascular Findings. J Am Coll Radiol 2013; 10:789-794. Aortic Atherosclerosis (ICD10-I70.0). Electronically Signed   By: Keith Rake M.D.   On: 02/22/2022 18:15   DG Chest Port 1 View  Result Date: 02/22/2022 CLINICAL DATA:  Chest pain. EXAM: PORTABLE CHEST 1 VIEW COMPARISON:  December 31, 2021 FINDINGS: Chronic mild opacity in left base, likely scarring. Stable cardiomegaly. The hila and mediastinum are unchanged. Sternotomy wires are intact. No pneumothorax. No nodules or masses. IMPRESSION: No active disease. Electronically Signed   By: Dorise Bullion III M.D.   On: 02/22/2022 16:14    Procedures .Marland KitchenLaceration Repair  Date/Time: 02/22/2022 6:25 PM Performed by: Deno Etienne, DO Authorized by: Deno Etienne, DO   Consent:    Consent obtained:  Verbal   Consent given by:  Patient and spouse   Risks, benefits, and alternatives were discussed: yes     Risks discussed:  Infection, pain, poor cosmetic result and poor wound healing   Alternatives discussed:  No treatment and delayed treatment Universal protocol:    Procedure explained and questions answered to patient or proxy's satisfaction: yes     Immediately prior to procedure, a time out was called: yes     Patient identity confirmed:  Verbally with patient Anesthesia:    Anesthesia method:  Local infiltration   Local anesthetic:  Lidocaine 2% WITH epi Laceration details:    Location:  Scalp   Scalp location:  Occipital   Length (cm):  5.5 Exploration:    Limited defect created (wound extended): no     Hemostasis achieved with:  Epinephrine and direct pressure   Imaging obtained comment:  CT   Imaging outcome:  foreign body not noted     Wound exploration: entire depth of wound visualized     Wound extent: no underlying fracture noted     Contaminated: no   Treatment:    Area cleansed with:  Saline   Amount of cleaning:  Standard   Irrigation solution:  Sterile saline   Irrigation volume:  20   Irrigation method:  Pressure wash   Visualized foreign bodies/material removed: no     Debridement:  None   Undermining:  None   Scar revision: no   Skin repair:    Repair method:  Staples   Number of staples:  5 Approximation:    Approximation:  Close Repair type:    Repair type:  Simple Post-procedure  details:    Dressing:  Open (no dressing)   Procedure completion:  Tolerated well, no immediate complications Comments:     Removal about day 7 .1-3 Lead EKG Interpretation Performed by: Deno Etienne, DO Authorized by: Deno Etienne, DO     Interpretation: normal     ECG rate:  80   ECG rate assessment: normal     Rhythm: sinus rhythm     Ectopy: none     Conduction: normal   Comments:     LBBB    Medications Ordered in ED Medications  morphine (PF) 4 MG/ML injection 4 mg (has no administration in time range)  ondansetron (ZOFRAN) injection 4 mg (has no administration in time range)  atorvastatin (LIPITOR) tablet 80 mg (has no administration in time range)  levothyroxine (SYNTHROID) tablet 50 mcg (has no administration in time range)  pantoprazole (PROTONIX) EC tablet 40 mg (has no administration in time range)  tamsulosin (FLOMAX) capsule 0.4 mg (has no administration in time range)  Tdap (BOOSTRIX) injection 0.5 mL (0.5 mLs Intramuscular Given 02/22/22 1701)  lidocaine-EPINEPHrine (XYLOCAINE W/EPI) 2 %-1:200000 (PF) injection 20 mL (20 mLs Infiltration Given 02/22/22 1701)  morphine (PF) 4 MG/ML injection 4 mg (4 mg Intravenous Given 02/22/22 1704)  ondansetron (ZOFRAN) injection 4 mg (4 mg Intravenous Given 02/22/22 1705)  iohexol (OMNIPAQUE) 300 MG/ML solution 100 mL (100 mLs Intravenous  Contrast Given 02/22/22 1741)    ED Course/ Medical Decision Making/ A&P                           Medical Decision Making Amount and/or Complexity of Data Reviewed Labs: ordered. Radiology: ordered.  Risk Prescription drug management. Decision regarding hospitalization.   88 yo M with a chief complaints of a fall.  The patient had a syncopal event, no prodrome history of heart failure.  Complaining mostly of headache and chest pain but does have pain that is fairly severe to the abdomen and the back.  We will obtain a CT scan of the head through the pelvis.  He arrived as a level 2 trauma as he is on Eliquis.  Most recent echo in April of this year with EF of 40-45%.  CT of the head through the pelvis without obvious acute pathology.  On my independent interpretation of the images the patient does have a left sided pleural effusion.  This appears to be old when compared to prior.  We will discuss with medicine.  The patients results and plan were reviewed and discussed.   Any x-rays performed were independently reviewed by myself.   Differential diagnosis were considered with the presenting HPI.  Medications  morphine (PF) 4 MG/ML injection 4 mg (has no administration in time range)  ondansetron (ZOFRAN) injection 4 mg (has no administration in time range)  atorvastatin (LIPITOR) tablet 80 mg (has no administration in time range)  levothyroxine (SYNTHROID) tablet 50 mcg (has no administration in time range)  pantoprazole (PROTONIX) EC tablet 40 mg (has no administration in time range)  tamsulosin (FLOMAX) capsule 0.4 mg (has no administration in time range)  Tdap (BOOSTRIX) injection 0.5 mL (0.5 mLs Intramuscular Given 02/22/22 1701)  lidocaine-EPINEPHrine (XYLOCAINE W/EPI) 2 %-1:200000 (PF) injection 20 mL (20 mLs Infiltration Given 02/22/22 1701)  morphine (PF) 4 MG/ML injection 4 mg (4 mg Intravenous Given 02/22/22 1704)  ondansetron (ZOFRAN) injection 4 mg (4 mg Intravenous  Given 02/22/22 1705)  iohexol (OMNIPAQUE) 300 MG/ML solution 100 mL (100  mLs Intravenous Contrast Given 02/22/22 1741)    Vitals:   02/22/22 1545 02/22/22 1600 02/22/22 1715 02/22/22 1845  BP: (!) 112/59 (!) 114/55 (!) 108/55 135/73  Pulse: 65 62 68 79  Resp: (!) 25 (!) 25 20 (!) 21  SpO2: 95% 94% 92% 100%    Final diagnoses:  Syncope and collapse  Laceration of scalp, initial encounter    Admission/ observation were discussed with the admitting physician, patient and/or family and they are comfortable with the plan.         Final Clinical Impression(s) / ED Diagnoses Final diagnoses:  Syncope and collapse  Laceration of scalp, initial encounter    Rx / DC Orders ED Discharge Orders     None         Deno Etienne, DO 02/22/22 [place], DO 02/22/22 1937

## 2022-02-22 NOTE — Plan of Care (Signed)

## 2022-02-22 NOTE — ED Triage Notes (Signed)
Pt came in [place] for fall on thinners.  Pt was working outside and had syncopal episode from standing position.  PT takes Eliquis.  Main complaint is headache and chest/upper abd pain on palpation.  Pt has a lac to back of head and a spot bleeding on forehead from having a spot removed.

## 2022-02-22 NOTE — Progress Notes (Signed)
Chaplain responding to referral for pt Eric Gordon regarding level 2 trauma. Pt was unavailable at time of visit.  Chaplain services remain available for follow-up spiritual/emotional support as needed.  Ortencia Kick, [place]      02/22/22 1800  Clinical Encounter Type  Visited With Patient not available  Visit Type Initial;ED;Trauma  Referral From Nurse

## 2022-02-23 ENCOUNTER — Observation Stay (HOSPITAL_COMMUNITY): Payer: Medicare Other

## 2022-02-23 DIAGNOSIS — I255 Ischemic cardiomyopathy: Secondary | ICD-10-CM | POA: Diagnosis not present

## 2022-02-23 DIAGNOSIS — N179 Acute kidney failure, unspecified: Secondary | ICD-10-CM | POA: Diagnosis present

## 2022-02-23 DIAGNOSIS — I25810 Atherosclerosis of coronary artery bypass graft(s) without angina pectoris: Secondary | ICD-10-CM | POA: Diagnosis present

## 2022-02-23 DIAGNOSIS — S0101XA Laceration without foreign body of scalp, initial encounter: Secondary | ICD-10-CM | POA: Diagnosis present

## 2022-02-23 DIAGNOSIS — E039 Hypothyroidism, unspecified: Secondary | ICD-10-CM

## 2022-02-23 DIAGNOSIS — Z95810 Presence of automatic (implantable) cardiac defibrillator: Secondary | ICD-10-CM | POA: Diagnosis not present

## 2022-02-23 DIAGNOSIS — Y92017 Garden or yard in single-family (private) house as the place of occurrence of the external cause: Secondary | ICD-10-CM | POA: Diagnosis not present

## 2022-02-23 DIAGNOSIS — I739 Peripheral vascular disease, unspecified: Secondary | ICD-10-CM

## 2022-02-23 DIAGNOSIS — I350 Nonrheumatic aortic (valve) stenosis: Secondary | ICD-10-CM | POA: Diagnosis present

## 2022-02-23 DIAGNOSIS — E860 Dehydration: Secondary | ICD-10-CM | POA: Diagnosis present

## 2022-02-23 DIAGNOSIS — I700 Atherosclerosis of aorta: Secondary | ICD-10-CM | POA: Diagnosis present

## 2022-02-23 DIAGNOSIS — I472 Ventricular tachycardia, unspecified: Secondary | ICD-10-CM | POA: Diagnosis not present

## 2022-02-23 DIAGNOSIS — M159 Polyosteoarthritis, unspecified: Secondary | ICD-10-CM | POA: Diagnosis present

## 2022-02-23 DIAGNOSIS — D696 Thrombocytopenia, unspecified: Secondary | ICD-10-CM | POA: Diagnosis present

## 2022-02-23 DIAGNOSIS — M7989 Other specified soft tissue disorders: Secondary | ICD-10-CM | POA: Diagnosis not present

## 2022-02-23 DIAGNOSIS — K219 Gastro-esophageal reflux disease without esophagitis: Secondary | ICD-10-CM | POA: Diagnosis present

## 2022-02-23 DIAGNOSIS — I495 Sick sinus syndrome: Secondary | ICD-10-CM | POA: Diagnosis present

## 2022-02-23 DIAGNOSIS — I5042 Chronic combined systolic (congestive) and diastolic (congestive) heart failure: Secondary | ICD-10-CM | POA: Diagnosis not present

## 2022-02-23 DIAGNOSIS — N400 Enlarged prostate without lower urinary tract symptoms: Secondary | ICD-10-CM | POA: Diagnosis present

## 2022-02-23 DIAGNOSIS — I4891 Unspecified atrial fibrillation: Secondary | ICD-10-CM | POA: Diagnosis not present

## 2022-02-23 DIAGNOSIS — I7143 Infrarenal abdominal aortic aneurysm, without rupture: Secondary | ICD-10-CM | POA: Diagnosis present

## 2022-02-23 DIAGNOSIS — E785 Hyperlipidemia, unspecified: Secondary | ICD-10-CM | POA: Diagnosis not present

## 2022-02-23 DIAGNOSIS — W1830XA Fall on same level, unspecified, initial encounter: Secondary | ICD-10-CM | POA: Diagnosis present

## 2022-02-23 DIAGNOSIS — I2510 Atherosclerotic heart disease of native coronary artery without angina pectoris: Secondary | ICD-10-CM

## 2022-02-23 DIAGNOSIS — I10 Essential (primary) hypertension: Secondary | ICD-10-CM

## 2022-02-23 DIAGNOSIS — I110 Hypertensive heart disease with heart failure: Secondary | ICD-10-CM | POA: Diagnosis present

## 2022-02-23 DIAGNOSIS — Z23 Encounter for immunization: Secondary | ICD-10-CM | POA: Diagnosis present

## 2022-02-23 DIAGNOSIS — L538 Other specified erythematous conditions: Secondary | ICD-10-CM | POA: Diagnosis not present

## 2022-02-23 DIAGNOSIS — I714 Abdominal aortic aneurysm, without rupture, unspecified: Secondary | ICD-10-CM | POA: Diagnosis not present

## 2022-02-23 DIAGNOSIS — R55 Syncope and collapse: Secondary | ICD-10-CM

## 2022-02-23 DIAGNOSIS — I447 Left bundle-branch block, unspecified: Secondary | ICD-10-CM | POA: Diagnosis present

## 2022-02-23 DIAGNOSIS — G8929 Other chronic pain: Secondary | ICD-10-CM | POA: Diagnosis present

## 2022-02-23 DIAGNOSIS — I480 Paroxysmal atrial fibrillation: Secondary | ICD-10-CM | POA: Diagnosis present

## 2022-02-23 DIAGNOSIS — I253 Aneurysm of heart: Secondary | ICD-10-CM | POA: Diagnosis present

## 2022-02-23 DIAGNOSIS — I4892 Unspecified atrial flutter: Secondary | ICD-10-CM | POA: Diagnosis present

## 2022-02-23 DIAGNOSIS — I4729 Other ventricular tachycardia: Secondary | ICD-10-CM | POA: Diagnosis present

## 2022-02-23 DIAGNOSIS — I452 Bifascicular block: Secondary | ICD-10-CM | POA: Diagnosis present

## 2022-02-23 DIAGNOSIS — I5043 Acute on chronic combined systolic (congestive) and diastolic (congestive) heart failure: Secondary | ICD-10-CM | POA: Diagnosis present

## 2022-02-23 LAB — PHOSPHORUS: Phosphorus: 4.7 mg/dL — ABNORMAL HIGH (ref 2.5–4.6)

## 2022-02-23 LAB — ECHOCARDIOGRAM LIMITED
AR max vel: 1.51 cm2
AV Area VTI: 1.42 cm2
AV Area mean vel: 1.43 cm2
AV Mean grad: 18.6 mmHg
AV Peak grad: 32.9 mmHg
Ao pk vel: 2.87 m/s
Height: 75.5 in
S' Lateral: 4.3 cm
Single Plane A4C EF: 42.4 %
Weight: 3294.55 oz

## 2022-02-23 LAB — COMPREHENSIVE METABOLIC PANEL
ALT: 15 U/L (ref 0–44)
AST: 25 U/L (ref 15–41)
Albumin: 3.8 g/dL (ref 3.5–5.0)
Alkaline Phosphatase: 71 U/L (ref 38–126)
Anion gap: 7 (ref 5–15)
BUN: 29 mg/dL — ABNORMAL HIGH (ref 8–23)
CO2: 28 mmol/L (ref 22–32)
Calcium: 9.3 mg/dL (ref 8.9–10.3)
Chloride: 107 mmol/L (ref 98–111)
Creatinine, Ser: 1.43 mg/dL — ABNORMAL HIGH (ref 0.61–1.24)
GFR, Estimated: 47 mL/min — ABNORMAL LOW (ref >60)
Glucose, Bld: 113 mg/dL — ABNORMAL HIGH (ref 70–99)
Potassium: 4 mmol/L (ref 3.5–5.1)
Sodium: 142 mmol/L (ref 135–145)
Total Bilirubin: 0.6 mg/dL (ref 0.3–1.2)
Total Protein: 6.8 g/dL (ref 6.5–8.1)

## 2022-02-23 LAB — CBC WITH DIFFERENTIAL/PLATELET
Abs Immature Granulocytes: 0.04 10*3/uL (ref 0.00–0.07)
Basophils Absolute: 0.1 10*3/uL (ref 0.0–0.1)
Basophils Relative: 1 %
Eosinophils Absolute: 0.2 10*3/uL (ref 0.0–0.5)
Eosinophils Relative: 2 %
HCT: 41.1 % (ref 39.0–52.0)
Hemoglobin: 13 g/dL (ref 13.0–17.0)
Immature Granulocytes: 0 %
Lymphocytes Relative: 22 %
Lymphs Abs: 2.1 10*3/uL (ref 0.7–4.0)
MCH: 31.5 pg (ref 26.0–34.0)
MCHC: 31.6 g/dL (ref 30.0–36.0)
MCV: 99.5 fL (ref 80.0–100.0)
Monocytes Absolute: 1.1 10*3/uL — ABNORMAL HIGH (ref 0.1–1.0)
Monocytes Relative: 11 %
Neutro Abs: 6.3 10*3/uL (ref 1.7–7.7)
Neutrophils Relative %: 64 %
Platelets: 143 10*3/uL — ABNORMAL LOW (ref 150–400)
RBC: 4.13 MIL/uL — ABNORMAL LOW (ref 4.22–5.81)
RDW: 14.6 % (ref 11.5–15.5)
WBC: 9.7 10*3/uL (ref 4.0–10.5)
nRBC: 0 % (ref 0.0–0.2)

## 2022-02-23 LAB — MAGNESIUM
Magnesium: 1.9 mg/dL (ref 1.7–2.4)
Magnesium: 1.8 mg/dL (ref 1.7–2.4)

## 2022-02-23 LAB — MRSA NEXT GEN BY PCR, NASAL: MRSA by PCR Next Gen: NOT DETECTED

## 2022-02-23 LAB — SURGICAL PCR SCREEN
MRSA, PCR: NEGATIVE
Staphylococcus aureus: NEGATIVE

## 2022-02-23 LAB — POTASSIUM: Potassium: 4.3 mmol/L (ref 3.5–5.1)

## 2022-02-23 LAB — TSH: TSH: 2.442 u[IU]/mL (ref 0.350–4.500)

## 2022-02-23 MED ORDER — APIXABAN 5 MG PO TABS
5.0000 mg | ORAL_TABLET | Freq: Two times a day (BID) | ORAL | Status: DC
Start: 2022-02-23 — End: 2022-02-23
  Filled 2022-02-23: qty 1

## 2022-02-23 MED ORDER — TRAMADOL HCL 50 MG PO TABS
50.0000 mg | ORAL_TABLET | Freq: Four times a day (QID) | ORAL | Status: DC | PRN
  Administered 2022-02-23 – 2022-02-26 (×8): 50 mg via ORAL
  Filled 2022-02-23 (×8): qty 1

## 2022-02-23 MED ORDER — PERFLUTREN LIPID MICROSPHERE
1.0000 mL | INTRAVENOUS | Status: AC | PRN
  Administered 2022-02-23: 2 mL via INTRAVENOUS

## 2022-02-23 NOTE — Consult Note (Signed)
Cardiology Consultation:   Patient ID: Eric Gordon MRN: [id number]; DOB: 09/18/1931  Admit date: 02/22/2022 Date of Consult: 02/23/2022  Primary Care Provider: Shon Baton, MD [facility] Cardiologist: Dr. [facility] Electrophysiologist:  None   Patient Profile:   Eric Gordon is a 88 y.o. male with CAD s/p CABG, DOE, spontaneous PTX, b/l CAS s/p R/L TCAR (2021/2022), AAA, OSA, prior PE, AF and tremor who is being seen today for the evaluation of syncope at the request of Dr. Nevada Crane.  History of Present Illness:   Eric Gordon reports that he was at his home painting his deck Saturday (02/22/22) and had one other worker helping him.  He was standing up and partially leaning over to paint the stairs when he started to develop gradual blurry vision and associated lightheadedness.  He sat down for a few minutes and then was standing again when the worker helping him had walked away to get something and after returning a few minutes later found him completely out on the deck.  He had no preceding symptoms other than the blurry vision during the initial episode but did not remember actually losing consciousness.  He was unable to even catch himself when he fell out.  The prodrome of blurry vision was several minutes long before he completely lost consciousness.  He denied any associated chest pain, chest pressure, shortness of breath, nausea, or diaphoresis leading up to this event.  He was wearing his Apple Watch which is able to monitor for certain arrhythmias during the episode which his wife is bringing from home this morning for further review.  He has had similar episodes in the past which he thinks in total were probably 5-6 different episodes most of the time would not related to position changes.  He was on diltiazem until rate control of his atrial fibrillation and after discontinuing this over a year ago had not had any recurrent episodes until today's  presentation.  His prior episodes were similar in fact they will have loss of consciousness during each of them but fortunately he was in areas where he could fall without trauma like the carpet in his house.  He thinks that he is usually out for a few minutes in duration although often he is unable to determine exact time.  He denies any loss of bowel or bladder function during these episodes and this is the first time that he has had trauma related to a fall.  Past Medical History:  Diagnosis Date   Abdominal aortic aneurysm (AAA) (HCC)    Achilles tendinitis of right lower extremity    Arthritis    "my whole body" (03/19/2018)   Atherosclerosis of coronary artery bypass graft w/o angina pectoris    Atrial fibrillation (HCC)    Barrett's esophagus    CAD (coronary artery disease)    Carotid artery disease (HCC)    CHF (congestive heart failure) (HCC)    Chronic anticoagulation    PE   Chronic lower back pain    Dyspnea    Essential tremor    GERD (gastroesophageal reflux disease)    High cholesterol    Hyperlipidemia    Hyperplasia, prostate    Hypertension    Hypothyroidism    Myocardial infarction ([place])    2 in Jan. 2019   Nail dystrophy    OSA on CPAP    uses CPAP   Osteoarthrosis    Pneumonia    "now and once before" (03/19/2018)  Pulmonary embolism [facility])    April 2012 after CABG   S/P CABG (coronary artery bypass graft)    Sleep apnea    Spontaneous pneumothorax    left spontaneous pneumothorax/left pleural effusion, s/p chest tube 01/09/19; thoracentesis x2, s/p left VATS/tacl pleurodesis 04/12/19   Past Surgical History:  Procedure Laterality Date   ACHILLES TENDON REPAIR Bilateral    2006 & 2004   CARDIAC CATHETERIZATION  11/2010   CORONARY ANGIOGRAPHY N/A 10/22/2018   Procedure: CORONARY ANGIOGRAPHY;  Surgeon: Troy Sine, MD;  Location: [place] CV LAB;  Service: Cardiovascular;  Laterality: N/A;   CORONARY ARTERY BYPASS GRAFT  March 2012   CABG X 5    ESOPHAGOGASTRODUODENOSCOPY (EGD) WITH PROPOFOL N/A 12/10/2020   Procedure: ESOPHAGOGASTRODUODENOSCOPY (EGD) WITH PROPOFOL;  Surgeon: Doran Stabler, MD;  Location: [facility] ENDOSCOPY;  Service: Gastroenterology;  Laterality: N/A;   INGUINAL HERNIA REPAIR Left 1980   INTRAVASCULAR PRESSURE WIRE/FFR STUDY N/A 10/22/2018   Procedure: INTRAVASCULAR PRESSURE WIRE/FFR STUDY;  Surgeon: Troy Sine, MD;  Location: [place] CV LAB;  Service: Cardiovascular;  Laterality: N/A;   IR THORACENTESIS ASP PLEURAL SPACE W/IMG GUIDE  02/04/2019   IR THORACENTESIS ASP PLEURAL SPACE W/IMG GUIDE  02/24/2019   KNEE ARTHROSCOPY Left 2006   LEFT HEART CATH AND CORS/GRAFTS ANGIOGRAPHY N/A 10/18/2018   Procedure: LEFT HEART CATH AND CORS/GRAFTS ANGIOGRAPHY;  Surgeon: Lorretta Harp, MD;  Location: [place] CV [facility];  Service: Cardiovascular;  Laterality: N/A;   LEFT HEART CATH AND CORS/GRAFTS ANGIOGRAPHY N/A 10/21/2018   Procedure: LEFT HEART CATH AND CORS/GRAFTS ANGIOGRAPHY;  Surgeon: Troy Sine, MD;  Location: [place] CV [facility];  Service: Cardiovascular;  Laterality: N/A;   PENILE PROSTHESIS IMPLANT     PLEURAL BIOPSY Left 04/12/2019   Procedure: Pleural Biopsy;  Surgeon: Grace Isaac, MD;  Location: [place];  Service: Thoracic;  Laterality: Left;   PLEURAL EFFUSION DRAINAGE Left 04/12/2019   Procedure: Drainage Of Pleural Effusion;  Surgeon: Grace Isaac, MD;  Location: [place];  Service: Thoracic;  Laterality: Left;   RIGHT HEART CATHETERIZATION N/A 11/14/2013   Procedure: RIGHT HEART CATH;  Surgeon: Larey Dresser, MD;  Location: [facility] CATH LAB;  Service: Cardiovascular;  Laterality: N/A;   SHOULDER OPEN ROTATOR CUFF REPAIR Right 2003   TALC PLEURODESIS Left 04/12/2019   Procedure: Pietro Cassis;  Surgeon: Grace Isaac, MD;  Location: [place];  Service: Thoracic;  Laterality: Left;   TEE WITHOUT CARDIOVERSION N/A 03/24/2019   Procedure: TRANSESOPHAGEAL ECHOCARDIOGRAM (TEE);  Surgeon: Larey Dresser, MD;  Location: [facility] ENDOSCOPY;  Service: Cardiovascular;  Laterality: N/A;   TEE WITHOUT CARDIOVERSION  04/12/2019   Procedure: Transesophageal Echocardiogram (Tee);  Surgeon: Grace Isaac, MD;  Location: [facility] OR;  Service: Thoracic;;   THORACOSCOPY Left 04/12/2019   VIDEO BRONCHOSCOPY (N/A ) VIDEO ASSISTED THORACOSCOPY (Left Chest) TALC PLEURADESIS (Left    TRANSCAROTID ARTERY REVASCULARIZATION  Right 09/13/2020   Procedure: RIGHT TRANSCAROTID ARTERY REVASCULARIZATION USING 1m X 475mENROUTE TRANSCAROTID STEND SYSTEM;  Surgeon: BrSerafina MitchellMD;  [place] [facility] Service: V[place];  Laterality: Right;   TRANSCAROTID ARTERY REVASCULARIZATION  Left 02/08/2021   Procedure: LEFT TRANSCAROTID ARTERY REVASCULARIZATION;  Surgeon: BrSerafina MitchellMD;  Location: [facility] OR;  Service: Vascular;  Laterality: Left;   ULTRASOUND GUIDANCE FOR VASCULAR ACCESS  09/13/2020   Procedure: ULTRASOUND GUIDANCE FOR VASCULAR ACCESS;  Surgeon: BrSerafina MitchellMD;  [place] [facility] Service: V[facility];;  VIDEO ASSISTED THORACOSCOPY Left 04/12/2019   Procedure: VIDEO ASSISTED THORACOSCOPY;  Surgeon: Grace Isaac, MD;  Location: [place];  Service: Thoracic;  Laterality: Left;   VIDEO BRONCHOSCOPY N/A 04/12/2019   Procedure: VIDEO BRONCHOSCOPY;  Surgeon: Grace Isaac, MD;  Location: [facility] OR;  Service: Thoracic;  Laterality: N/A;    Home Medications:  Prior to Admission medications   Medication Sig Start Date End Date Taking? Authorizing Provider  acetaminophen (TYLENOL) 500 MG tablet Take 1,000 mg by mouth every 6 (six) hours as needed for moderate pain or headache.   Yes [provider]  apixaban (ELIQUIS) 5 MG TABS tablet Take 5 mg by mouth 2 (two) times daily.   Yes [provider]  atorvastatin (LIPITOR) 80 MG tablet Take 1 tablet (80 mg total) by mouth daily. 11/01/18  Yes Larey Dresser, MD  Carboxymethylcellulose Sod PF 0.5 % SOLN Place 1 drop into both eyes See admin  instructions. Instill 1 drop into both eyes two to four times a day as needed for dryness   Yes [provider]  Cholecalciferol (VITAMIN D-3) 25 MCG (1000 UT) CAPS Take 1,000 Units by mouth daily.   Yes [provider]  fexofenadine (ALLEGRA) 180 MG tablet Take 180 mg by mouth daily.   Yes [provider]  isosorbide mononitrate (IMDUR) 30 MG 24 hr tablet Take 1 tablet (30 mg total) by mouth daily. 02/26/21  Yes Larey Dresser, MD  levothyroxine (SYNTHROID) 75 MCG tablet Take 75 mcg by mouth daily before breakfast.   Yes [provider]  Multiple Vitamins-Minerals (CENTRUM SILVER PO) Take 1 tablet by mouth See admin instructions. Take 1 tablet by mouth every other day with breakfast   Yes [provider]  pantoprazole (PROTONIX) 40 MG tablet Take 40 mg by mouth daily before breakfast.   Yes [provider]  spironolactone (ALDACTONE) 25 MG tablet Take 1 tablet (25 mg total) by mouth daily. Patient taking differently: Take 25 mg by mouth in the morning. 11/15/21  Yes Larey Dresser, MD  tamsulosin (FLOMAX) 0.4 MG CAPS capsule Take 0.4 mg by mouth every evening.   Yes [provider]  traMADol (ULTRAM) 50 MG tablet Take 1 tablet (50 mg total) by mouth every 6 (six) hours as needed for moderate pain. 02/09/21  Yes Dagoberto Ligas, PA-C  valsartan (DIOVAN) 40 MG tablet Take 0.5 tablets (20 mg total) by mouth 2 (two) times daily. 05/28/21  Yes Larey Dresser, MD  furosemide (LASIX) 40 MG tablet Take 0.5 tablets (20 mg total) by mouth as needed. Patient not taking: Reported on 02/22/2022 01/16/22   Joette Catching, PA-C  levothyroxine (SYNTHROID, LEVOTHROID) 50 MCG tablet Take 50 mcg by mouth daily before breakfast. Patient not taking: Reported on 02/22/2022    [provider]    Inpatient Medications: Scheduled Meds:  atorvastatin  80 mg Oral Daily   levothyroxine  75 mcg Oral QAC breakfast   pantoprazole  40 mg Oral Daily    tamsulosin  0.4 mg Oral QPC supper   Continuous Infusions:  PRN Meds: acetaminophen, HYDROmorphone (DILAUDID) injection, melatonin, oxyCODONE, polyethylene glycol, prochlorperazine  Allergies:    Allergies  Allergen Reactions   Ancef [Cefazolin Sodium] Hives   Empagliflozin Other (See Comments)    Dizziness   Vancomycin Rash    Social History:   Social History   Socioeconomic History   Marital status: Married    Spouse name: Not on file   Number of children:  2   Years of education: Not on file   Highest education level: Not on file  Occupational History   Occupation: Scientist, clinical (histocompatibility and immunogenetics): [employer]  Tobacco Use   Smoking status: Former    Packs/day: 1.00    Years: 35.00    Pack years: 35.00    Types: Cigarettes    Quit date: 02/18/1984    Years since quitting: 38.0   Smokeless tobacco: Never  Vaping Use   Vaping Use: Never used  Substance and Sexual Activity   Alcohol use: Yes    Alcohol/week: 2.0 standard drinks    Types: 2 Standard drinks or equivalent per week   Drug use: Never   Sexual activity: Not on file  Other Topics Concern   Not on file  Social History Narrative   Not on file   Social Determinants of Health   Financial Resource Strain: Not on file  Food Insecurity: Not on file  Transportation Needs: Not on file  Physical Activity: Not on file  Stress: Not on file  Social Connections: Not on file  Intimate Partner Violence: Not on file    Family History:    Family History  Problem Relation Age of Onset   Coronary artery disease Mother    Hypertension Mother    Heart disease Mother     ROS:  Review of Systems: [y] = yes, '[ ]'$  = no      General: Weight gain '[ ]'$ ; Weight loss '[ ]'$ ; Anorexia '[ ]'$ ; Fatigue '[ ]'$ ; Fever '[ ]'$ ; Chills '[ ]'$ ; Weakness '[ ]'$    Cardiac: Chest pain/pressure '[ ]'$ ; Resting SOB '[ ]'$ ; Exertional SOB '[ ]'$ ; Orthopnea '[ ]'$ ; Pedal Edema '[ ]'$ ; Palpitations '[ ]'$ ; Syncope '[ ]'$ ; Presyncope '[ ]'$ ; Paroxysmal nocturnal dyspnea '[ ]'$    Pulmonary: Cough [  ]; Wheezing '[ ]'$ ; Hemoptysis '[ ]'$ ; Sputum '[ ]'$ ; Snoring '[ ]'$    GI: Vomiting '[ ]'$ ; Dysphagia '[ ]'$ ; Melena '[ ]'$ ; Hematochezia '[ ]'$ ; Heartburn '[ ]'$ ; Abdominal pain '[ ]'$ ; Constipation '[ ]'$ ; Diarrhea '[ ]'$ ; BRBPR '[ ]'$    GU: Hematuria '[ ]'$ ; Dysuria '[ ]'$ ; Nocturia '[ ]'$  Vascular: Pain in legs with walking '[ ]'$ ; Pain in feet with lying flat '[ ]'$ ; Non-healing sores '[ ]'$ ; Stroke '[ ]'$ ; TIA '[ ]'$ ; Slurred speech '[ ]'$ ;   Neuro: Headaches '[ ]'$ ; Vertigo '[ ]'$ ; Seizures '[ ]'$ ; Paresthesias '[ ]'$ ;Blurred vision [y]; Diplopia '[ ]'$ ; Vision changes '[ ]'$ ; LOC [y];  Ortho/Skin: Arthritis '[ ]'$ ; Joint pain '[ ]'$ ; Muscle pain '[ ]'$ ; Joint swelling '[ ]'$ ; Back Pain '[ ]'$ ; Rash '[ ]'$    Psych: Depression '[ ]'$ ; Anxiety '[ ]'$    Heme: Bleeding problems '[ ]'$ ; Clotting disorders '[ ]'$ ; Anemia '[ ]'$    Endocrine: Diabetes '[ ]'$ ; Thyroid dysfunction '[ ]'$    Physical Exam/Data:   Vitals:   02/22/22 2003 02/22/22 2304 02/23/22 0340 02/23/22 0426  BP: (!) 147/80 133/83 (!) 112/43   Pulse: 82 71 (!) 56   Resp: '17 16 15   '$ Temp: 98.1 F (36.7 C) 97.9 F (36.6 C) 98 F (36.7 C)   TempSrc: Oral Oral Oral   SpO2: 97% 99% 99%   Weight: 93 kg   93.4 kg  Height: 6' 3.5" (1.918 m)       Intake/Output Summary (Last 24 hours) at 02/23/2022 0531 Last data filed at 02/22/2022 2330 Gross per 24 hour  Intake 240 ml  Output 600 ml  Net -360 ml  02/23/2022    4:26 AM 02/22/2022    8:03 PM 02/22/2022    3:45 PM  Last 3 Weights  Weight (lbs) 205 lb 14.6 oz 205 lb 0.4 oz 210 lb 15.7 oz  Weight (kg) 93.4 kg 93 kg 95.7 kg     Body mass index is 25.4 kg/m.  General:  Well nourished, well developed, in no acute distress HEENT: normal Lymph: no adenopathy Neck: no JVD Endocrine:  No thryomegaly Vascular: No carotid bruits; FA pulses 2+ bilaterally without bruits  Cardiac:  normal S1, S2; RRR; LUSB 3/6 systolic murmur also throughout precordium  Lungs:  clear to auscultation bilaterally, no wheezing, rhonchi or rales  Abd: soft, nontender, no hepatomegaly  Ext: no edema Musculoskeletal:   No deformities, BUE and BLE strength normal and equal Skin: warm and dry  Neuro:  CNs 2-12 intact, no focal abnormalities noted Psych:  Normal affect   EKG:  The EKG was personally reviewed (02/22/22, 15:39:04) and demonstrates: NSR ventricular rate 59 bpm, PR 111 ms, QRS 176 ms, Qtc 486 ms, RBBB and LAFB  Telemetry:  Telemetry was personally reviewed and demonstrates: NSR   Relevant CV Studies:  Holter monitor Result date: 01/16/22 1. Predominantly NSR.  2. 2% atrial fibrillation burden.  3. 4 NSVT runs, longest lasting 5 beats.  4. Rare PVCs  TTE Result date: 01/01/22  1. Poor acoustical windows limit accuracy of wall motion assessment. Left  ventricular ejection fraction, by estimation, is 40 to 45%. The left  ventricle has mildly decreased function. The left ventricle demonstrates  regional wall motion abnormalities  (see scoring diagram/findings for description). There is mild concentric  left ventricular hypertrophy. Left ventricular diastolic parameters are  consistent with Grade I diastolic dysfunction (impaired relaxation). There  is akinesis of the left  ventricular, entire inferolateral wall.   2. Right ventricular systolic function is normal. The right ventricular  size is moderately enlarged. There is normal pulmonary artery systolic  pressure. The estimated right ventricular systolic pressure is 72.5 mmHg.   3. Left atrial size was mildly dilated.   4. Right atrial size was mild to moderately dilated.   5. The mitral valve is normal in structure. Trivial mitral valve  regurgitation. No evidence of mitral stenosis. Moderate to severe mitral  annular calcification.   6. The aortic valve is calcified. There is mild calcification of the  aortic valve. There is moderate thickening of the aortic valve. Aortic  valve regurgitation is trivial. Mild aortic valve stenosis. Aortic valve  mean gradient measures 18.0 mmHg. Aortic   valve Vmax measures 2.85 m/s.   7. Aortic  dilatation noted. There is mild dilatation of the ascending  aorta, measuring 42 mm.   8. The inferior vena cava is dilated in size with <50% respiratory  variability, suggesting right atrial pressure of 15 mmHg.   Laboratory Data:  High Sensitivity Troponin:   Recent Labs  Lab 02/22/22 1649  TROPONINIHS 14     Chemistry Recent Labs  Lab 02/18/22 0920 02/22/22 1649 02/22/22 1656 02/23/22 0111  NA 139 142 141 142  K 4.8 4.2 4.0 4.0  CL 106 107 108 107  CO2 30 27  --  28  GLUCOSE 100* 98 106* 113*  BUN 20 31* 37* 29*  CREATININE 1.15 1.68* 1.90* 1.43*  CALCIUM 9.7 9.8  --  9.3  GFRNONAA >60 39*  --  47*  ANIONGAP 3* 8  --  7    Recent Labs  Lab 02/22/22 1649 02/23/22  0111  PROT 6.6 6.8  ALBUMIN 3.7 3.8  AST 25 25  ALT 14 15  ALKPHOS 71 71  BILITOT 0.6 0.6   Hematology Recent Labs  Lab 02/22/22 1649 02/22/22 1656 02/23/22 0111  WBC 9.7  --  9.7  RBC 4.02*  --  4.13*  HGB 12.8* 13.3 13.0  HCT 40.5 39.0 41.1  MCV 100.7*  --  99.5  MCH 31.8  --  31.5  MCHC 31.6  --  31.6  RDW 14.4  --  14.6  PLT 145*  --  143*   BNPNo results for input(s): BNP, PROBNP in the last 168 hours.  DDimer No results for input(s): DDIMER in the last 168 hours.  Radiology/Studies:  CT Head Wo Contrast  Result Date: 02/22/2022 CLINICAL DATA:  Fall, head trauma. EXAM: CT HEAD WITHOUT CONTRAST TECHNIQUE: Contiguous axial images were obtained from the base of the skull through the vertex without intravenous contrast. RADIATION DOSE REDUCTION: This exam was performed according to the departmental dose-optimization program which includes automated exposure control, adjustment of the mA and/or kV according to patient size and/or use of iterative reconstruction technique. COMPARISON:  None Available. FINDINGS: Brain: Brain volume is normal for age. No intracranial hemorrhage, mass effect, or midline shift. No hydrocephalus. The basilar cisterns are patent. No evidence of territorial infarct or  acute ischemia. No extra-axial or intracranial fluid collection. Vascular: Atherosclerosis of skullbase vasculature without hyperdense vessel or abnormal calcification. Skull: No fracture or focal lesion. Sinuses/Orbits: Paranasal sinuses and mastoid air cells are clear. The visualized orbits are unremarkable. Other: Left parietooccipital scalp hematoma with laceration and skin staples. IMPRESSION: Left parietooccipital scalp hematoma with laceration and skin staples. No acute intracranial abnormality. No skull fracture. Electronically Signed   By: Keith Rake M.D.   On: 02/22/2022 18:20   CT Cervical Spine Wo Contrast  Result Date: 02/22/2022 CLINICAL DATA:  Neck trauma, fall. EXAM: CT CERVICAL SPINE WITHOUT CONTRAST TECHNIQUE: Multidetector CT imaging of the cervical spine was performed without intravenous contrast. Multiplanar CT image reconstructions were also generated. RADIATION DOSE REDUCTION: This exam was performed according to the departmental dose-optimization program which includes automated exposure control, adjustment of the mA and/or kV according to patient size and/or use of iterative reconstruction technique. COMPARISON:  None Available. FINDINGS: Alignment: Normal. Skull base and vertebrae: No acute fracture. Vertebral body heights are maintained. The dens and skull base are intact. Incidental note of non fusion of the posterior arch of C1, variant anatomy. Soft tissues and spinal canal: No prevertebral fluid or swelling. No visible canal hematoma. Disc levels:  Mild degenerative disc disease C4-C5 and C5-C6. Upper chest: Cyst on concurrent chest CT, reported separately. Other: Bilateral carotid stents. IMPRESSION: Mild degenerative disc disease. No acute fracture or subluxation of the cervical spine. Electronically Signed   By: Keith Rake M.D.   On: 02/22/2022 18:17   CT CHEST ABDOMEN PELVIS W CONTRAST  Result Date: 02/22/2022 CLINICAL DATA:  88 year old post fall. EXAM: CT CHEST,  ABDOMEN, AND PELVIS WITH CONTRAST TECHNIQUE: Multidetector CT imaging of the chest, abdomen and pelvis was performed following the standard protocol during bolus administration of intravenous contrast. RADIATION DOSE REDUCTION: This exam was performed according to the departmental dose-optimization program which includes automated exposure control, adjustment of the mA and/or kV according to patient size and/or use of iterative reconstruction technique. CONTRAST:  121m OMNIPAQUE IOHEXOL 300 MG/ML  SOLN COMPARISON:  Chest radiograph earlier today.  Chest CTA 12/31/2021 FINDINGS: CT CHEST FINDINGS Cardiovascular: No evidence  of acute aortic or vascular injury. Moderate aortic atherosclerosis. No aortic aneurysm. Post CABG with dense calcification of native coronary arteries. Mild cardiomegaly. Pseudoaneurysm arising from the inferior left ventricular wall measures 3.6 cm, unchanged from prior CT, no associated stranding. Mitral annulus calcifications. No pericardial effusion. Mediastinum/Nodes: No mediastinal hemorrhage. No adenopathy. Patulous esophagus. No pneumomediastinum. Lungs/Pleura: Small chronic left pleural effusion with sequela of prior talc pleurodesis, high density along the pleural surface at the left lung base. There is associated scarring in the left lower lobe. Chronic fluid tracking into the inter lobar fissure on the left. No pneumothorax or pulmonary contusion. Chronic biapical pleuroparenchymal scarring. No acute airspace disease. Musculoskeletal: Prior median sternotomy, no sternal fracture. Diffuse thoracic spondylosis, no acute thoracic spine fracture. No acute fracture of the ribs, included clavicles or shoulder girdles. There is no confluent chest wall hematoma. CT ABDOMEN PELVIS FINDINGS Hepatobiliary: No hepatic injury or perihepatic hematoma. There are multiple scattered tiny hepatic hypodensities are too small to accurately characterize but likely represent small cysts or biliary  hamartomas. Gallbladder is unremarkable. Pancreas: No injury. Fatty atrophy of the head and uncinate process. No ductal dilatation or inflammation. Spleen: No splenic injury or perisplenic hematoma. Adrenals/Urinary Tract: No adrenal hemorrhage or renal injury identified. There are bilateral renal cysts that need no further follow-up. Bladder is unremarkable. Stomach/Bowel: Ingested material within the stomach with equivocal gastric wall thickening, nontraumatic. Small mural lipoma within the duodenum, nonobstructing. There is no evidence of bowel injury or mesenteric hematoma. Colonic diverticulosis without diverticulitis. The sigmoid colon is redundant. The appendix is not confidently visualized. Vascular/Lymphatic: No aortic or vascular injury. Moderate aortic atherosclerosis. 5 cm infrarenal aortic aneurysm without periaortic stranding or evidence of rupture. There is no retroperitoneal fluid or stranding. No adenopathy. Reproductive: Enlarged prostate gland causing mild mass effect on the bladder base. Penile prosthesis with decompressed pump in the midline. Other: No free air or free fluid. There is no confluent body wall contusion. Small bilateral fat containing inguinal hernias. Musculoskeletal: No acute fracture of the pelvis. Moderate lumbar degenerative change without acute fracture. IMPRESSION: 1. No evidence of acute traumatic injury to the chest, abdomen, or pelvis. 2. Small chronic left pleural effusion with sequela of prior talc pleurodesis, high density along the pleural surface at the left lung base. 3. Pseudoaneurysm of the inferior left ventricular wall measuring 3.6 cm, unchanged from prior CT, no associated stranding. 4. Colonic diverticulosis without diverticulitis. 5. Enlarged prostate gland causing mild mass effect on the bladder base. 6. Infrarenal abdominal aortic aneurysm, 5 cm. Recommend follow-up every 6 months and vascular consultation. This recommendation follows ACR consensus  guidelines: White Paper of the ACR Incidental Findings Committee II on Vascular Findings. J Am Coll Radiol 2013; 10:789-794. Aortic Atherosclerosis (ICD10-I70.0). Electronically Signed   By: Keith Rake M.D.   On: 02/22/2022 18:15   DG Chest Port 1 View  Result Date: 02/22/2022 CLINICAL DATA:  Chest pain. EXAM: PORTABLE CHEST 1 VIEW COMPARISON:  December 31, 2021 FINDINGS: Chronic mild opacity in left base, likely scarring. Stable cardiomegaly. The hila and mediastinum are unchanged. Sternotomy wires are intact. No pneumothorax. No nodules or masses. IMPRESSION: No active disease. Electronically Signed   By: Dorise Bullion III M.D.   On: 02/22/2022 16:14   { Assessment and Plan:   Syncope LV pseudoaneurysm  AF Mr. Steuber presents with acute syncope that is concerning for arrhythmic etiology.  He has been standing up for several minutes prior to symptom onset and positional changes did not exacerbate  this.  He has a known atrial fibrillation and has been evaluated by EP for management of this however his recent was only 2% although he has had much higher importance in the past.  He was intermittently on amiodarone but there was some concern about its contribution to recurrent left pleural effusions following a spontaneous pneumothorax.  Pleural fluid studies showed exudative fluid with eosinophilic predominance.  He recently had a cardiac monitor several weeks ago which showed 3 pauses of greater than 3 seconds which were automatically triggered and without reported associated symptoms.  These appear to be consistent with post atrial flutter with pauses.  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  EP did not feel that he would be appropriate AF ablation candidate given his age and comorbidities.  I do think he be a reasonable candidate for pacemaker placement and Micra could be an option for him given his low pacing needs and comorbidities.  His wife  is going to bring his wife in an [place] this morning and hopefully it looks like tomorrow when he had a syncopal episode which he thinks was around 3 PM yesterday.  I do not have reason to believe there is a more likely explanation currently for his syncopal episode tachybradycardia syndrome with postconversion symptomatic pauses.  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  For questions or updates, please contact [place] Please consult [email or web address] for contact info under   Signed, Dion Body, MD  02/23/2022 5:31 AM

## 2022-02-23 NOTE — Assessment & Plan Note (Signed)
Continue atorvastatin

## 2022-02-23 NOTE — Assessment & Plan Note (Signed)
Placed on monitoring, no events since admission.  Patient recently had long-term electrocardiographic monitoring that showed several 3-second postconversion pauses.  In the setting of a bifascicular block, now recurrent syncope, cardiology feel that he is a pacemaker candidate and will discuss with EP. - [place] cardiology, plan for PPM today

## 2022-02-23 NOTE — Evaluation (Signed)
Occupational Therapy Evaluation Patient Details Name: Eric Gordon MRN: [id number] DOB: Mar 12, 1931 Today's Date: 02/23/2022   History of Present Illness Eric Gordon is a 88 y.o. male presented to [facility] ED from home after a syncopal episode while working on his deck. 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   Clinical Impression   This 88 yo male admitted with above presents to acute OT with PLOF of being totally independent to Mod I with all basic ADLs from a [facility] standpoint.Currently he is setup/S-mod A for basic ADLs due to pain. His sats did drop into mid-high 80's on RA while up and about and sitting in recliner at end of session talking (reapplied O2 at 1 liter). Pt with low BP for orthostatics but not orthostatic. He will continue to benefit from acute OT with follow up [place].      Recommendations for follow up therapy are one component of a multi-disciplinary discharge planning process, led by the attending physician.  Recommendations may be updated based on patient status, additional functional criteria and insurance authorization.   Follow Up Recommendations  Home health OT    Assistance Recommended at Discharge Frequent or constant Supervision/Assistance  Patient can return home with the following A little help with walking and/or transfers;A little help with bathing/dressing/bathroom;Help with stairs or ramp for entrance;Assistance with cooking/housework;Assist for transportation    Functional Status Assessment  Patient has had a recent decline in their functional status and demonstrates the ability to make significant improvements in function in a reasonable and predictable amount of  time.  Equipment Recommendations  None recommended by OT       Precautions / Restrictions Precautions Precautions: Fall Restrictions Weight Bearing Restrictions: No      Mobility Bed Mobility Overal bed mobility: Modified Independent             General bed mobility comments: increased time, HOB up and use of rail    Transfers Overall transfer level: Needs assistance Equipment used: Rolling walker (2 wheels) Transfers: Sit to/from Stand Sit to Stand: Min assist                  Balance Overall balance assessment: Mild deficits observed, not formally tested                                         ADL either performed or assessed with clinical judgement   ADL Overall ADL's : Needs assistance/impaired Eating/Feeding: Independent;Sitting   Grooming: Set up;Sitting   Upper Body Bathing: Set up;Sitting Upper Body Bathing Details (indicate cue type and reason): increased time due to pain Lower Body Bathing: Minimal assistance;Sit to/from stand Lower Body Bathing Details (indicate cue type and reason): min A sit<>stand;increased time due to pain Upper Body Dressing : Set up;Sitting Upper Body Dressing Details (indicate cue type and reason): increased time due to pain Lower Body Dressing: Moderate assistance Lower Body Dressing Details (indicate cue type and reason): min A sit<>stand;increased time due to pain Toilet Transfer: Min guard;Ambulation;Rolling walker (2 wheels) Toilet Transfer Details (indicate cue type and reason): simulated bed around to recliner Toileting- Clothing Manipulation and Hygiene: Minimal assistance;Sit to/from stand  Vision Ability to See in Adequate Light: 0 Adequate Patient Visual Report: No change from baseline              Pertinent Vitals/Pain Pain Assessment Pain Assessment: 0-10 Pain Score: 9  Pain Location: between shoulder blades and substernal Pain Descriptors / Indicators: Aching,  Sore Pain Intervention(s): Limited activity within patient's tolerance, Monitored during session, Repositioned, Patient requesting pain meds-RN notified     Hand Dominance  right   Extremity/Trunk Assessment Upper Extremity Assessment Upper Extremity Assessment: Overall WFL for tasks assessed (does report 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)           Communication  No issues   Cognition Arousal/Alertness: Awake/alert Behavior During Therapy: WFL for tasks assessed/performed Overall Cognitive Status: Within Functional Limits for tasks assessed                                       General Comments  See orthostatic vitals in chart (low but not orthostatic), sats did drop on RA into mid to upper 80's (MD made aware via secure chat)            Home Living Family/patient expects to be discharged to:: Private residence Living Arrangements: Spouse/significant other Available Help at Discharge: Family;Available 24 hours/day Type of Home: House Home Access: Stairs to enter [employer] of Steps: 4 Entrance Stairs-Rails: Left Home Layout: Two level;Able to live on main level with bedroom/bathroom     Bathroom Shower/Tub: Tub/shower unit   Bathroom Toilet: Standard     Home Equipment: Cane - single [employer] (2 wheels);Shower seat;Toilet riser   Additional Comments: he and wife both drive      Prior Functioning/Environment Prior Level of Function : Independent/Modified Independent;Driving;History of Falls (last six months)             Mobility Comments: has fallen 5x in past year, uses cane ADLs Comments: independent with self care        OT Problem List: Impaired balance (sitting and/or standing);Pain      OT Treatment/Interventions: Self-care/ADL training;DME and/or AE instruction;Patient/family education;Balance training    OT Goals(Current goals can be  found in the care plan section) Acute Rehab OT Goals Patient Stated Goal: possibly go home today OT Goal Formulation: With patient Time For Goal Achievement: 03/09/22 Potential to Achieve Goals: Good  OT Frequency: Min 2X/week    Co-evaluation PT/OT/SLP Co-Evaluation/Treatment: Yes Reason for Co-Treatment: For patient/therapist safety;To address functional/ADL transfers PT goals addressed during session: Mobility/safety with mobility;Balance;Proper use of DME;Strengthening/ROM OT goals addressed during session: Strengthening/ROM;ADL's and self-care      AM-PAC OT "6 Clicks" Daily Activity     Outcome Measure Help from another person eating meals?: None Help from another person taking care of personal grooming?: A Little Help from another person toileting, which includes using toliet, bedpan, or urinal?: A Little Help from another person bathing (including washing, rinsing, drying)?: A Little Help from another person to put on and taking off regular upper body clothing?: A Little Help from another person to put on and taking off regular lower body clothing?: A Lot 6 Click Score: 18   End of Session Equipment Utilized During Treatment: Gait belt;Rolling walker (2 wheels) Nurse Communication: Mobility status (would benefit from inspirometer (not one in clean utility))  Activity Tolerance: Patient tolerated treatment well (but  painful) Patient left: in chair;with call bell/phone within reach;with chair alarm set  OT Visit Diagnosis: Unsteadiness on feet (R26.81);Other abnormalities of gait and mobility (R26.89);Muscle weakness (generalized) (M62.81);Pain Pain - part of body:  (between shoulder blades and sub sternal)                Time: 8372-9021 OT Time Calculation (min): 29 min Charges:  OT General Charges $OT Visit: 1 Visit OT Evaluation $OT Eval Moderate Complexity: [place], OTR/L Acute [employer] [facility] [phone] Office  [phone]   Almon Register 02/23/2022, 9:11 AM

## 2022-02-23 NOTE — Assessment & Plan Note (Signed)
-   Continue statin, Eliquis unless he needs to be held for cardiac procedures

## 2022-02-23 NOTE — Assessment & Plan Note (Signed)
-   Monitor on telemetry - Consult cardiology, appreciate cares

## 2022-02-23 NOTE — Progress Notes (Signed)
Consent taken for procedure tomorrow, surgical PCR swab sent, pt is aware of NPO from midnight, will continue to monitor  Palma Holter, RN

## 2022-02-23 NOTE — Assessment & Plan Note (Addendum)
Still hypoxic and Cardiology feel he is volume overloaded. - Gentle IV Lasix - Resume spironolactone - Hold valsartan - Hold BB given pauses

## 2022-02-23 NOTE — Assessment & Plan Note (Signed)
Continue levothyroxine 

## 2022-02-23 NOTE — Assessment & Plan Note (Addendum)
-   Hold Imdur, valsartan given soft blood pressure - Continue atorvastatin

## 2022-02-23 NOTE — Progress Notes (Addendum)
No new syncopal events. Telemetry shows normal sinus rhythm. 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  There is mild-moderate aortic stenosis, not severe enough to cause syncope. A recent event monitor showed several episodes of atrial flutter with postconversion pauses of up to 3.4 seconds.  He had not had syncope at that time, but with this current event I think there is enough evidence to justify implantation of a pacemaker. His left ventricular aneurysm does expose him to the risk of ventricular tachycardia and he did have some nonsustained brief VT on his arrhythmia monitor.  Benefit of defibrillator therapy is very questionable though at age 88. Discussed with Dr. Quentin Ore.  We will hold his Eliquis for possible device implantation tomorrow afternoon. Discussed pacemaker implantation with the patient and his wife.  Reviewed potential complications that include infection, lead dislodgment and need for reoperation, lead perforation, pneumothorax, bleeding, etc. This procedure has been fully reviewed with the patient and written informed consent has been obtained. 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  We may be able to give vancomycin slowly or use clindamycin for antibiotic prophylaxis with pacemaker implantation.  Will discuss with Dr. Caryl Comes and our clinical pharmacists tomorrow.

## 2022-02-23 NOTE — Progress Notes (Signed)
Pt still refusing CPAP. Will monitor

## 2022-02-23 NOTE — Assessment & Plan Note (Addendum)
-   Hold Eliquis for PPM, resume when appropriate after, defer to EP - Consult Cardiology - Hold Coreg

## 2022-02-23 NOTE — Hospital Course (Signed)
Eric Gordon is an 88 year old M with chronic systolic CHF, HTN, mild aortic stenosis, hypothyroidism, paroxysmal atrial fibrillation on Eliquis, pseudoaneurysm of the heart, peripheral vascular disease (carotid and AAA) and coronary disease s;p CABG remotely who presented with recurrent syncope.   6/3: Admitted on tele 6/4: Cardiology consulted, noted recent monitoring with 3s pause and bifascicular block, recommended EP evaluation

## 2022-02-23 NOTE — Assessment & Plan Note (Addendum)
Baseline 0.9-1.1, here was 1.7 up to 1.9 on admission.  Now resolved.

## 2022-02-23 NOTE — Evaluation (Signed)
Physical Therapy Evaluation Patient Details Name: Eric Gordon MRN: [id number] DOB: 10-21-1930 Today's Date: 02/23/2022  History of Present Illness  Eric Gordon is a 88 y.o. male presented to [facility] ED from home after a syncopal episode while working on his deck. 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  Clinical Impression  Pt admitted with above diagnosis. Pt from home with wife where he was independent, driving, had been working on restaining his deck. In addition to this fall, pt reports he has fallen 5x in past year with this one being the worst. On eval, pt with persistently low BP but not orthostatic. 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 Pt's O2 dropped into mid 80's on RA, 2 L O2 replaced after ambulation and SpO2 returned to upper 90's.  Pt would benefit from further acute PT as well as HHPT. Pt currently with functional limitations due to the deficits listed below (see PT Problem List). Pt will benefit from skilled PT to increase their independence and safety with mobility to allow discharge to the venue listed below.          Recommendations for follow up therapy are one component of a multi-disciplinary discharge planning process, led by the attending physician.  Recommendations may be updated based on patient status, additional functional criteria and insurance authorization.  Follow Up Recommendations Home health PT    Assistance Recommended at Discharge Intermittent Supervision/Assistance  Patient can return home with the following  A  little help with walking and/or transfers;A little help with bathing/dressing/bathroom;Assistance with cooking/housework;Help with stairs or ramp for entrance;Assist for transportation    Equipment Recommendations None recommended by PT  Recommendations for Other Services       Functional Status Assessment Patient has had a recent decline in their functional status and demonstrates the ability to make significant improvements in function in a reasonable and predictable amount of time.     Precautions / Restrictions Precautions Precautions: Fall Restrictions Weight Bearing Restrictions: No      Mobility  Bed Mobility Overal bed mobility: Modified Independent             General bed mobility comments: increased time, HOB up and use of rail    Transfers Overall transfer level: Needs assistance Equipment used: Rolling walker (2 wheels) Transfers: Sit to/from Stand Sit to Stand: Min assist, +2 safety/equipment           General transfer comment: min A for power up, pt has difficulty initiating B knee ext and avoids full wt on painful L knee but has h/o R knee weakness.    Ambulation/Gait Ambulation/Gait assistance: Min assist Gait Distance (Feet): 15 Feet Assistive device: Rolling walker (2 wheels) Gait Pattern/deviations: Step-through pattern, Decreased stride length Gait velocity: decreased Gait velocity interpretation: <1.31 ft/sec, indicative of household ambulator   General Gait Details: pt had diffciulty initiating stepping and once going took very short steps, approx 4 in, due to pain. Reliant on RW though he states he really does not want to use one long term  Stairs  Wheelchair Mobility    Modified Rankin (Stroke Patients Only)       Balance Overall balance assessment: Mild deficits observed, not formally tested                                           Pertinent Vitals/Pain Pain Assessment Pain Assessment:  0-10 Pain Score: 8  Pain Location: between shoulder blades and substernal Pain Descriptors / Indicators: Aching, Sore Pain Intervention(s): Limited activity within patient's tolerance, Patient requesting pain meds-RN notified    Home Living Family/patient expects to be discharged to:: Private residence Living Arrangements: Spouse/significant other Available Help at Discharge: Family;Available 24 hours/day Type of Home: House Home Access: Stairs to enter Entrance Stairs-Rails: Left Entrance Stairs-Number of Steps: 4   Home Layout: Two level;Able to live on main level with bedroom/bathroom Home Equipment: Cane - single point;Rolling Walker (2 wheels);Shower seat;Toilet riser Additional Comments: he and wife both drive. 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    Prior Function Prior Level of Function : Independent/Modified Independent;Driving;History of Falls (last six months)             Mobility Comments: has fallen 5x in past year, uses cane ADLs Comments: independent with self care     Hand Dominance   Dominant Hand: Right    Extremity/Trunk Assessment   Upper Extremity Assessment Upper Extremity Assessment: Defer to OT evaluation    Lower Extremity Assessment Lower Extremity Assessment: RLE deficits/detail;LLE deficits/detail RLE Deficits / Details: has old injury to R femur, knee ext 3+/5, has difficulty pushing up to standing RLE Sensation: WNL RLE Coordination: WNL LLE Deficits / Details: reports that he needs a knee replacement L knee and has sleeve on. knee ext 3/5, keeps wt more to R with sit>stand. LLE Sensation: WNL LLE Coordination: WNL    Cervical / Trunk Assessment Cervical / Trunk Assessment: Kyphotic (forward head)  Communication   Communication: No difficulties  Cognition Arousal/Alertness: Awake/alert Behavior During Therapy: WFL for tasks assessed/performed Overall Cognitive Status: Within Functional Limits for  tasks assessed                                          General Comments General comments (skin integrity, edema, etc.): orthostatic vitals taken, see flowsheet in chart, BP low throughout but pt not orthostatic on eval. SpO2 in mid 80's on RA, 2 L replaced after ambulation with return into 90's. Spoke to RN about obtaining IS for pt use.    Exercises     Assessment/Plan    PT Assessment Patient needs continued PT services  PT Problem List Decreased strength;Decreased activity tolerance;Decreased balance;Decreased mobility;Pain;Cardiopulmonary status limiting activity       PT Treatment Interventions DME instruction;Gait training;Stair training;Functional mobility training;Therapeutic activities;Therapeutic exercise;Balance training;Patient/family education    PT Goals (Current goals can be found in the Care Plan section)  Acute Rehab PT Goals Patient Stated Goal: return home PT Goal Formulation: With patient Time For Goal Achievement: 03/09/22 Potential to Achieve Goals: Good    Frequency Min 3X/week     Co-evaluation PT/OT/SLP Co-Evaluation/Treatment: Yes Reason for Co-Treatment: Complexity of the patient's impairments (multi-system involvement);For patient/therapist safety PT goals addressed during session: Mobility/safety with mobility;Balance;Proper use of DME OT goals addressed during session: Strengthening/ROM;ADL's and self-care  AM-PAC PT "6 Clicks" Mobility  Outcome Measure Help needed turning from your back to your side while in a flat bed without using bedrails?: None Help needed moving from lying on your back to sitting on the side of a flat bed without using bedrails?: None Help needed moving to and from a bed to a chair (including a wheelchair)?: A Little Help needed standing up from a chair using your arms (e.g., wheelchair or bedside chair)?: A Little Help needed to walk in hospital room?: A Little Help needed climbing 3-5 steps with  a railing? : A Lot 6 Click Score: 19    End of Session Equipment Utilized During Treatment: Gait belt;Oxygen Activity Tolerance: Patient limited by pain Patient left: in chair;with call bell/phone within reach;with chair alarm set Nurse Communication: Mobility status;Patient requests pain meds PT Visit Diagnosis: Unsteadiness on feet (R26.81);Muscle weakness (generalized) (M62.81);Repeated falls (R29.6);Difficulty in walking, not elsewhere classified (R26.2);Pain Pain - part of body:  (chest and back)    Time: 2500-3704 PT Time Calculation (min) (ACUTE ONLY): 30 min   Charges:   PT Evaluation $PT Eval Moderate Complexity: [place]  Pager [phone] Office [place] 02/23/2022, 9:33 AM

## 2022-02-23 NOTE — Assessment & Plan Note (Signed)
Blood pressure normal - Hold Imdur, valsartan, Coreg - Resume furosemide, spironolactone

## 2022-02-23 NOTE — Progress Notes (Signed)
  Progress Note   Patient: Eric Gordon FFM:[id number] DOB: 04-Apr-1931 DOA: 02/22/2022     0 DOS: the patient was seen and examined on 02/23/2022 at 11:02 AM      Brief hospital course: Eric Gordon is an 88 year old M with chronic systolic CHF, HTN, mild aortic stenosis, hypothyroidism, paroxysmal atrial fibrillation on Eliquis, pseudoaneurysm of the heart, peripheral vascular disease (carotid and AAA) and coronary disease s;p CABG remotely who presented with recurrent syncope.   6/3: Admitted on tele 6/4: Cardiology consulted, noted recent monitoring with 3s pause and bifascicular block, recommended EP evaluation     Assessment and Plan: * Sick sinus syndrome (Eric Gordon) - Monitor on telemetry - Consult cardiology, appreciate cares  Syncope Patient recently had long-term electrocardiographic monitoring that showed several 3-second postconversion pauses.  In the setting of a bifascicular block, now recurrent syncope, cardiology feel that he is a pacemaker candidate and will discuss with EP. - Consult cardiology, appreciate expertise.  PVD (peripheral vascular disease) carotid - Continue statin, Eliquis unless he needs to be held for cardiac procedures  Hyperlipidemia - Continue atorvastatin  AKI (acute kidney injury) (Eric Gordon) Baseline 0.9-1.1, here was 1.7 up to 1.9 on admission.  Now improving.  Hypothyroidism - Continue levothyroxine  Chronic combined systolic and diastolic CHF (congestive heart failure) (Eric Gordon) BP soft.   - Hold spiro, furos, valsartan  Paroxysmal atrial fibrillation (Eric Gordon) - Continue Elqiuis unless it needs to be held for cardiac procedures - [facility]  CAD s/p CABG 3/12  - Hold Imdur, valsartan given soft blood pressure - Continue atorvastatin   Abdominal aortic aneurysm (Eric Gordon) - Continue atorvastatin  Essential hypertension Blood pressure low normal --Hold furosemide, Imdur, spironolactone, valsartan          Subjective:  Patient has pain from his fall on his right shoulder and chest, he has no fever, confusion, further syncope, dyspnea, swelling.     Physical Exam: Vitals:   02/23/22 0426 02/23/22 0749 02/23/22 0823 02/23/22 1131  BP:  (!) 100/49 (!) 103/40 (!) 131/52  Pulse:  67 63 64  Resp:  '16 16 18  '$ Temp:  99.7 F (37.6 C)  97.7 F (36.5 C)  TempSrc:  Oral  Oral  SpO2:  98% 94% 98%  Weight: 93.4 kg     Height:       Elderly adult male, lying in bed, no acute distress, appears tired and in pain RRR, loud systolic murmur, mild pitting edema of the ankles bilaterally, no JVD Respiratory effort normal, lungs clear without rales or wheezes He is severe tenderness to palpation of the right precordium Abdomen soft no tenderness palpation or guarding Attention normal, judgment and insight appear normal, oriented to person, place, and time, speech fluent, right arm strength limited by pain but otherwise strength appears symmetric in the upper and lower extremities bilaterally  Data Reviewed: Cardiology, nursing notes reviewed, vital signs reviewed Basic metabolic panel shows stable creatinine 1.4 Complete blood count unremarkable LFTs normal Magnesium 1.9 CT head unremarkable CT C-spine unremarkable CT of the chest abdomen and pelvis shows no fractures of the ribs, shoulder girdle  Family Communication: Wife at the bedside    Disposition: Status is: Inpatient         Author: Edwin Dada, MD 02/23/2022 11:59 AM  For on call review [email or web address].

## 2022-02-23 NOTE — Progress Notes (Signed)
Mobility Specialist Progress Note:   02/23/22 1157  Mobility  Activity Stood at bedside;Dangled on edge of bed  Level of Assistance Moderate assist, patient does 50-74%  Assistive Device Front wheel walker  Distance Ambulated (ft) 4 ft  Activity Response Tolerated fair  $Mobility charge 1 Mobility   Pt received EOB needing to stand to use urinal. ModA to stand. Pt declining ambulation d/t pain. Pt able to take side steps along EOB. Left EOB with call bell in reach and all needs met.   [facility] Eric Gordon Mobility Specialist

## 2022-02-24 ENCOUNTER — Inpatient Hospital Stay (HOSPITAL_COMMUNITY)
Admission: EM | Disposition: A | Discharge status: Home Health | Payer: Self-pay | Source: Home / Self Care | Attending: Family Medicine

## 2022-02-24 DIAGNOSIS — I5043 Acute on chronic combined systolic (congestive) and diastolic (congestive) heart failure: Secondary | ICD-10-CM | POA: Diagnosis not present

## 2022-02-24 DIAGNOSIS — I472 Ventricular tachycardia, unspecified: Secondary | ICD-10-CM

## 2022-02-24 DIAGNOSIS — E785 Hyperlipidemia, unspecified: Secondary | ICD-10-CM

## 2022-02-24 DIAGNOSIS — I495 Sick sinus syndrome: Principal | ICD-10-CM

## 2022-02-24 HISTORY — PX: PACEMAKER IMPLANT: EP1218

## 2022-02-24 LAB — BASIC METABOLIC PANEL
Anion gap: 6 (ref 5–15)
BUN: 24 mg/dL — ABNORMAL HIGH (ref 8–23)
CO2: 28 mmol/L (ref 22–32)
Calcium: 9.2 mg/dL (ref 8.9–10.3)
Chloride: 104 mmol/L (ref 98–111)
Creatinine, Ser: 1.22 mg/dL (ref 0.61–1.24)
GFR, Estimated: 57 mL/min — ABNORMAL LOW (ref >60)
Glucose, Bld: 110 mg/dL — ABNORMAL HIGH (ref 70–99)
Potassium: 4.4 mmol/L (ref 3.5–5.1)
Sodium: 138 mmol/L (ref 135–145)

## 2022-02-24 SURGERY — PACEMAKER IMPLANT
Anesthesia: LOCAL

## 2022-02-24 MED ORDER — FENTANYL CITRATE (PF) 100 MCG/2ML IJ SOLN
INTRAMUSCULAR | Status: AC
  Filled 2022-02-24: qty 2

## 2022-02-24 MED ORDER — BUPIVACAINE HCL (PF) 0.25 % IJ SOLN
INTRAMUSCULAR | Status: DC | PRN
  Administered 2022-02-24: 30 mL

## 2022-02-24 MED ORDER — MIDAZOLAM HCL 5 MG/5ML IJ SOLN
INTRAMUSCULAR | Status: DC | PRN
  Administered 2022-02-24 (×3): 1 mg via INTRAVENOUS

## 2022-02-24 MED ORDER — SODIUM CHLORIDE 0.9 % IV SOLN: 250.0000 mL | INTRAVENOUS | Status: DC | PRN

## 2022-02-24 MED ORDER — SODIUM CHLORIDE 0.9 % IV SOLN: INTRAVENOUS | Status: DC

## 2022-02-24 MED ORDER — BUPIVACAINE HCL (PF) 0.25 % IJ SOLN
INTRAMUSCULAR | Status: AC
  Filled 2022-02-24: qty 30

## 2022-02-24 MED ORDER — ONDANSETRON HCL 4 MG/2ML IJ SOLN: 4.0000 mg | Freq: Four times a day (QID) | INTRAMUSCULAR | Status: DC | PRN

## 2022-02-24 MED ORDER — FUROSEMIDE 10 MG/ML IJ SOLN: 20.0000 mg | Freq: Once | INTRAMUSCULAR | Status: DC

## 2022-02-24 MED ORDER — HEPARIN (PORCINE) IN NACL 1000-0.9 UT/500ML-% IV SOLN
INTRAVENOUS | Status: AC
  Filled 2022-02-24: qty 500

## 2022-02-24 MED ORDER — SODIUM CHLORIDE 0.9% FLUSH
3.0000 mL | INTRAVENOUS | Status: DC | PRN
Start: 2022-02-24 — End: 2022-02-26

## 2022-02-24 MED ORDER — ACETAMINOPHEN 325 MG PO TABS: 650.0000 mg | ORAL_TABLET | ORAL | Status: DC | PRN

## 2022-02-24 MED ORDER — SODIUM CHLORIDE 0.9% FLUSH
3.0000 mL | Freq: Two times a day (BID) | INTRAVENOUS | Status: DC
  Administered 2022-02-24 – 2022-02-26 (×3): 3 mL via INTRAVENOUS

## 2022-02-24 MED ORDER — MIDAZOLAM HCL 5 MG/5ML IJ SOLN
INTRAMUSCULAR | Status: AC
  Filled 2022-02-24: qty 5

## 2022-02-24 MED ORDER — SPIRONOLACTONE 25 MG PO TABS
25.0000 mg | ORAL_TABLET | Freq: Every day | ORAL | Status: DC
  Administered 2022-02-24 – 2022-02-26 (×3): 25 mg via ORAL
  Filled 2022-02-24 (×3): qty 1

## 2022-02-24 MED ORDER — VANCOMYCIN HCL IN DEXTROSE 1-5 GM/200ML-% IV SOLN
1000.0000 mg | INTRAVENOUS | Status: DC
  Filled 2022-02-24: qty 200

## 2022-02-24 MED ORDER — FENTANYL CITRATE (PF) 100 MCG/2ML IJ SOLN
INTRAMUSCULAR | Status: DC | PRN
  Administered 2022-02-24 (×2): 25 ug via INTRAVENOUS

## 2022-02-24 MED ORDER — SODIUM CHLORIDE 0.9 % IV SOLN
80.0000 mg | INTRAVENOUS | Status: DC
  Filled 2022-02-24: qty 2

## 2022-02-24 SURGICAL SUPPLY — 5 items
CABLE SURGICAL S-101-97-12 (CABLE) ×3 IMPLANT
CATH JOSEPH QUAD ALLRED 6F REP (CATHETERS) ×2 IMPLANT
PAD DEFIB RADIO PHYSIO CONN (PAD) ×3 IMPLANT
SHEATH PINNACLE 6F 10CM (SHEATH) ×2 IMPLANT
TRAY PACEMAKER INSERTION (PACKS) ×3 IMPLANT

## 2022-02-24 NOTE — Consult Note (Addendum)
Cardiology Consultation:   Patient ID: Eric Gordon MRN: [id number]; DOB: [birth year]  Admit date: 02/22/2022 Date of Consult: 02/24/2022  PCP:  Shon Baton, MD   [facility] Providers Cardiologist:  None  Advanced Heart Failure:  [name], MD  {   Patient Profile:   Eric Gordon is a 88 y.o. male with a hx of CAD s/p CABG, DOE, spontaneous PTX, b/l CAS s/p R/L TCAR (2021/2022), AAA, OSA, prior PE, AF, LV aneurysm,  and tremor  who is being seen 02/24/2022 for the evaluation of symptomatic bradycardia at the request of Dr. Sallyanne Kuster.  History of Present Illness:   [name] was admitted with near syncope/syncope, reported a year or so ago on diltiazem having some similar episodes that stopped off the medication. Syncope/near syncope was worrisome for cardiac etiology and he was admitted for further evaluation.  He has prevoiously seen EP, Dr. Lovena Le in Feb 2023 for AFib rhythm management having been on amiodarone historically, stopped with some concerns of pulmonary involvement with spontaneous PTX and effusions.  He wore a monitor with a minimal AF burden, discussed AAD options of Tikosyn or sotalol though not felt to warrant yet, not thought an ablation candidate with a number of atrial arrhythgmias, AFib, flutter.  Monitor also noted some NSVT and evidence of SN dysfunction, though without symptoms.  3 Pauses occurred, the longest lasting 3.4 secs  May 2023 Conclusion:  1. Predominantly NSR.  2. 2% atrial fibrillation burden.  3. 4 NSVT runs, longest lasting 5 beats.  4. Rare PVCs  Now with syncope/near syncope, EP is asked to weigh in for consideration of PPM, eliquis held on admission,   Dr. Aundra Dubin has seen the patient this AM, discussed hx of some NSVT, known LV aneurysm, though given advanced age and no evidence of VT causing his syncope, felt more likely brady/pausing did not think there was a role for ICD.  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 He had a couple weak spells during the project but did not think much of it, feeling well otherwise. No CP, palpitations or SOB. He had been sitting a while got up to talk to the man helping him, had been standing chatting a few minutes, when he fainted.  He felt well and had no warning, fell backwards, thinks he may have twisted funny/pulled something with chest wall pain. He struck the back of his head required staples/closure.  Past Medical History:  Diagnosis Date   Abdominal aortic aneurysm (AAA) (HCC)    Achilles tendinitis of right lower extremity    Arthritis    "my whole body" (03/19/2018)   Atherosclerosis of coronary artery bypass graft w/o angina pectoris    Atrial fibrillation (HCC)    Barrett's esophagus    CAD (coronary artery disease)    Carotid artery disease (HCC)    CHF (congestive heart failure) (HCC)    Chronic anticoagulation    PE   Chronic lower back pain    Dyspnea    Essential tremor    GERD (gastroesophageal reflux disease)    High cholesterol    Hyperlipidemia    Hyperplasia, prostate    Hypertension    Hypothyroidism  Myocardial infarction [facility])    2 in Jan. 2019   Nail dystrophy    OSA on CPAP    uses CPAP   Osteoarthrosis    Pneumonia    "now and once before" (03/19/2018)   Pulmonary embolism [facility])    April 2012 after CABG   S/P CABG (coronary artery bypass graft)    Sleep apnea    Spontaneous pneumothorax    left spontaneous pneumothorax/left pleural effusion, s/p chest tube 01/09/19; thoracentesis x2, s/p left VATS/tacl pleurodesis 04/12/19    Past Surgical History:  Procedure Laterality Date   ACHILLES TENDON REPAIR Bilateral    2006 & 2004   CARDIAC CATHETERIZATION  11/2010   CORONARY  ANGIOGRAPHY N/A 10/22/2018   Procedure: CORONARY ANGIOGRAPHY;  Surgeon: Troy Sine, MD;  Location: [place] CV LAB;  Service: Cardiovascular;  Laterality: N/A;   CORONARY ARTERY BYPASS GRAFT  March 2012   CABG X 5   ESOPHAGOGASTRODUODENOSCOPY (EGD) WITH PROPOFOL N/A 12/10/2020   Procedure: ESOPHAGOGASTRODUODENOSCOPY (EGD) WITH PROPOFOL;  Surgeon: Doran Stabler, MD;  Location: [facility] ENDOSCOPY;  Service: Gastroenterology;  Laterality: N/A;   INGUINAL HERNIA REPAIR Left 1980   INTRAVASCULAR PRESSURE WIRE/FFR STUDY N/A 10/22/2018   Procedure: INTRAVASCULAR PRESSURE WIRE/FFR STUDY;  Surgeon: Troy Sine, MD;  Location: [place] CV LAB;  Service: Cardiovascular;  Laterality: N/A;   IR THORACENTESIS ASP PLEURAL SPACE W/IMG GUIDE  02/04/2019   IR THORACENTESIS ASP PLEURAL SPACE W/IMG GUIDE  02/24/2019   KNEE ARTHROSCOPY Left 2006   LEFT HEART CATH AND CORS/GRAFTS ANGIOGRAPHY N/A 10/18/2018   Procedure: LEFT HEART CATH AND CORS/GRAFTS ANGIOGRAPHY;  Surgeon: Lorretta Harp, MD;  Location: [place] CV LAB;  Service: Cardiovascular;  Laterality: N/A;   LEFT HEART CATH AND CORS/GRAFTS ANGIOGRAPHY N/A 10/21/2018   Procedure: LEFT HEART CATH AND CORS/GRAFTS ANGIOGRAPHY;  Surgeon: Troy Sine, MD;  Location: [place] CV LAB;  Service: Cardiovascular;  Laterality: N/A;   PENILE PROSTHESIS IMPLANT     PLEURAL BIOPSY Left 04/12/2019   Procedure: Pleural Biopsy;  Surgeon: Grace Isaac, MD;  Location: [place];  Service: Thoracic;  Laterality: Left;   PLEURAL EFFUSION DRAINAGE Left 04/12/2019   Procedure: Drainage Of Pleural Effusion;  Surgeon: Grace Isaac, MD;  Location: [place];  Service: Thoracic;  Laterality: Left;   RIGHT HEART CATHETERIZATION N/A 11/14/2013   Procedure: RIGHT HEART CATH;  Surgeon: Larey Dresser, MD;  Location: [facility] CATH LAB;  Service: Cardiovascular;  Laterality: N/A;   SHOULDER OPEN ROTATOR CUFF REPAIR Right 2003   TALC PLEURODESIS Left 04/12/2019   Procedure:  Pietro Cassis;  Surgeon: Grace Isaac, MD;  Location: [place];  Service: Thoracic;  Laterality: Left;   TEE WITHOUT CARDIOVERSION N/A 03/24/2019   Procedure: TRANSESOPHAGEAL ECHOCARDIOGRAM (TEE);  Surgeon: Larey Dresser, MD;  Location: [facility] ENDOSCOPY;  Service: Cardiovascular;  Laterality: N/A;   TEE WITHOUT CARDIOVERSION  04/12/2019   Procedure: Transesophageal Echocardiogram (Tee);  Surgeon: Grace Isaac, MD;  Location: [facility] OR;  Service: Thoracic;;   THORACOSCOPY Left 04/12/2019   VIDEO BRONCHOSCOPY (N/A ) VIDEO ASSISTED THORACOSCOPY (Left Chest) TALC PLEURADESIS (Left    TRANSCAROTID ARTERY REVASCULARIZATION  Right 09/13/2020   Procedure: RIGHT TRANSCAROTID ARTERY REVASCULARIZATION USING 70m X 468mENROUTE TRANSCAROTID STEND SYSTEM;  Surgeon: BrSerafina MitchellMD;  [place] Service: Vascular;  Laterality: Right;   TRANSCAROTID ARTERY REVASCULARIZATION  Left 02/08/2021   Procedure: LEFT TRANSCAROTID ARTERY REVASCULARIZATION;  Surgeon: BrSerafina Mitchell  MD;  Location: [place];  Service: Vascular;  Laterality: Left;   ULTRASOUND GUIDANCE FOR VASCULAR ACCESS  09/13/2020   Procedure: ULTRASOUND GUIDANCE FOR VASCULAR ACCESS;  Surgeon: Serafina Mitchell, MD;  Location: [place];  Service: Vascular;;   VIDEO ASSISTED THORACOSCOPY Left 04/12/2019   Procedure: VIDEO ASSISTED THORACOSCOPY;  Surgeon: Grace Isaac, MD;  Location: [place];  Service: Thoracic;  Laterality: Left;   VIDEO BRONCHOSCOPY N/A 04/12/2019   Procedure: VIDEO BRONCHOSCOPY;  Surgeon: Grace Isaac, MD;  Location[facility] OR;  Service: Thoracic;  Laterality: N/A;     Home Medications:  Prior to Admission medications   Medication Sig Start Date End Date Taking? Authorizing Provider  acetaminophen (TYLENOL) 500 MG tablet Take 1,000 mg by mouth every 6 (six) hours as needed for moderate pain or headache.   Yes [provider]  apixaban (ELIQUIS) 5 MG TABS tablet Take 5 mg by mouth 2 (two) times daily.   Yes  [provider]  atorvastatin (LIPITOR) 80 MG tablet Take 1 tablet (80 mg total) by mouth daily. 11/01/18  Yes Larey Dresser, MD  Carboxymethylcellulose Sod PF 0.5 % SOLN Place 1 drop into both eyes See admin instructions. Instill 1 drop into both eyes two to four times a day as needed for dryness   Yes [provider]  Cholecalciferol (VITAMIN D-3) 25 MCG (1000 UT) CAPS Take 1,000 Units by mouth daily.   Yes [provider]  fexofenadine (ALLEGRA) 180 MG tablet Take 180 mg by mouth daily.   Yes [provider]  isosorbide mononitrate (IMDUR) 30 MG 24 hr tablet Take 1 tablet (30 mg total) by mouth daily. 02/26/21  Yes Larey Dresser, MD  levothyroxine (SYNTHROID) 75 MCG tablet Take 75 mcg by mouth daily before breakfast.   Yes [provider]  Multiple Vitamins-Minerals (CENTRUM SILVER PO) Take 1 tablet by mouth See admin instructions. Take 1 tablet by mouth every other day with breakfast   Yes [provider]  pantoprazole (PROTONIX) 40 MG tablet Take 40 mg by mouth daily before breakfast.   Yes [provider]  spironolactone (ALDACTONE) 25 MG tablet Take 1 tablet (25 mg total) by mouth daily. Patient taking differently: Take 25 mg by mouth in the morning. 11/15/21  Yes Larey Dresser, MD  tamsulosin (FLOMAX) 0.4 MG CAPS capsule Take 0.4 mg by mouth every evening.   Yes [provider]  traMADol (ULTRAM) 50 MG tablet Take 1 tablet (50 mg total) by mouth every 6 (six) hours as needed for moderate pain. 02/09/21  Yes Dagoberto Ligas, PA-C  valsartan (DIOVAN) 40 MG tablet Take 0.5 tablets (20 mg total) by mouth 2 (two) times daily. 05/28/21  Yes Larey Dresser, MD  furosemide (LASIX) 40 MG tablet Take 0.5 tablets (20 mg total) by mouth as needed. Patient not taking: Reported on 02/22/2022 01/16/22   Joette Catching, PA-C  levothyroxine (SYNTHROID, LEVOTHROID) 50 MCG tablet Take 50 mcg by mouth daily before  breakfast. Patient not taking: Reported on 02/22/2022    [provider]    Inpatient Medications: Scheduled Meds:  atorvastatin  80 mg Oral Daily   furosemide  20 mg Intravenous Once   gentamicin irrigation  80 mg Irrigation On Call   levothyroxine  75 mcg Oral QAC breakfast   pantoprazole  40 mg Oral Daily   spironolactone  25 mg Oral Daily   tamsulosin  0.4 mg Oral QPC supper   Continuous Infusions:  sodium  chloride 50 mL/hr at 02/24/22 0526   vancomycin     PRN Meds: acetaminophen, melatonin, polyethylene glycol, prochlorperazine, traMADol  Allergies:    Allergies  Allergen Reactions   Ancef [Cefazolin Sodium] Hives   Empagliflozin Other (See Comments)    Dizziness   Vancomycin Rash    Social History:   Social History   Socioeconomic History   Marital status: Married    Spouse name: Not on file   Number of children: 2   Years of education: Not on file   Highest education level: Not on file  Occupational History   Occupation: Scientist, clinical (histocompatibility and immunogenetics): [employer]  Tobacco Use   Smoking status: Former    Packs/day: 1.00    Years: 35.00    Pack years: 35.00    Types: Cigarettes    Quit date: 02/18/1984    Years since quitting: 38.0   Smokeless tobacco: Never  Vaping Use   Vaping Use: Never used  Substance and Sexual Activity   Alcohol use: Yes    Alcohol/week: 2.0 standard drinks    Types: 2 Standard drinks or equivalent per week   Drug use: Never   Sexual activity: Not on file  Other Topics Concern   Not on file  Social History Narrative   Not on file   Social Determinants of Health   Financial Resource Strain: Not on file  Food Insecurity: Not on file  Transportation Needs: Not on file  Physical Activity: Not on file  Stress: Not on file  Social Connections: Not on file  Intimate Partner Violence: Not on file    Family History:   Family History  Problem Relation Age of Onset   Coronary artery disease Mother    Hypertension Mother    Heart  disease Mother      ROS:  Please see the history of present illness.  All other ROS reviewed and negative.     Physical Exam/Data:   Vitals:   02/23/22 1628 02/23/22 2349 02/24/22 0443 02/24/22 0733  BP: (!) 108/56  136/69 (!) 114/53  Pulse: 61 69 78   Resp: '12 18 18 15  '$ Temp: 98.5 F (36.9 C) 98.4 F (36.9 C) 98.1 F (36.7 C) 97.9 F (36.6 C)  TempSrc: Oral Oral Oral   SpO2: 97%     Weight:   93.2 kg   Height:        Intake/Output Summary (Last 24 hours) at 02/24/2022 0913 Last data filed at 02/24/2022 0534 Gross per 24 hour  Intake 486.08 ml  Output 1000 ml  Net -513.92 ml      02/24/2022    4:43 AM 02/23/2022    4:26 AM 02/22/2022    8:03 PM  Last 3 Weights  Weight (lbs) 205 lb 7.5 oz 205 lb 14.6 oz 205 lb 0.4 oz  Weight (kg) 93.2 kg 93.4 kg 93 kg     Body mass index is 25.34 kg/m.  General:  Well nourished, well developed, in no acute distress HEENT: staples posterior scalp/head Neck: no JVD Vascular: No carotid bruits; Distal pulses 2+ bilaterally Cardiac:  PFX;9/0 systolic murmur, no gallops or rubs Lungs:  CTA, no wheezing, rhonchi or rales  Abd: soft, nontender  Ext: no edema Musculoskeletal:  marked chest wall tenderness Skin: warm and dry  Neuro:  no gross focal motorabnormalities noted Psych:  Normal affect   EKG:  The EKG was personally reviewed and demonstrates:   SB 59bpm, RBBB, LAD, PACs  Telemetry:  Telemetry  was personally reviewed and demonstrates:   SB/SR generally 50's, PCAs, brief infrequent PATs   Relevant CV Studies:  02/23/22: TTE  1. There is a small aneurysm of the basal inferolateral wall. Thrombus is  not seen. Left ventricular ejection fraction, by estimation, is 40 to 45%.  The left ventricle has mildly decreased function. The left ventricle  demonstrates regional wall motion  abnormalities (see scoring diagram/findings for description). There is  mild concentric left ventricular hypertrophy. Left ventricular diastolic   function could not be evaluated.   2. Right ventricular systolic function is normal. The right ventricular  size is normal. There is normal pulmonary artery systolic pressure. The  estimated right ventricular systolic pressure is 60.6 mmHg.   3. 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 A small  partly mobile calcified component may be present. The mitral valve is  degenerative. No evidence of mitral stenosis. Moderate to severe mitral  annular calcification.   4. Tricuspid valve regurgitation is mild to moderate.   5. The aortic valve is tricuspid. There is moderate calcification of the  aortic valve. There is moderate thickening of the aortic valve. Aortic  valve regurgitation is not visualized. Mild to moderate aortic valve  stenosis.   6. There is borderline dilatation of the ascending aorta, measuring 39  mm.   7. The inferior vena cava is normal in size with greater than 50%  respiratory variability, suggesting right atrial pressure of 3 mmHg.   11/21/2021: stress myoview   Findings are consistent with prior myocardial infarction. The study is high risk.   There were ST depressions in the anterior leads and frequent brief runs of NSVT as well as isolated PVCs during stress.   LV perfusion is abnormal. There is a medium sized, severe fixed defect in the basal-to-apical inferior and inferolateral LV walls. There is a small fixed defect in the basal-to-mid anterolateral LV wall. There is abnormal wall motion in the defect area. Consistent with infarction.   Left ventricular function is abnormal. Nuclear stress EF: 37 %. The left ventricular ejection fraction is moderately decreased (30-44%). End diastolic cavity size is moderately enlarged.   Prior study available for comparison from 08/28/2020. Compared to prior study, the inferior and inferolateral defect is now seen in the apical segments as well as the basal-to-mid segments. There is also a basal-to-mid anterolateral  fixed defect.  Laboratory Data:  High Sensitivity Troponin:   Recent Labs  Lab 02/22/22 1649  TROPONINIHS 14     Chemistry Recent Labs  Lab 02/22/22 1649 02/22/22 1656 02/23/22 0111 02/23/22 1920 02/24/22 0613  NA 142 141 142  --  138  K 4.2 4.0 4.0 4.3 4.4  CL 107 108 107  --  104  CO2 27  --  28  --  28  GLUCOSE 98 106* 113*  --  110*  BUN 31* 37* 29*  --  24*  CREATININE 1.68* 1.90* 1.43*  --  1.22  CALCIUM 9.8  --  9.3  --  9.2  MG  --   --  1.9 1.8  --   GFRNONAA 39*  --  47*  --  57*  ANIONGAP 8  --  7  --  6    Recent Labs  Lab 02/22/22 1649 02/23/22 0111  PROT 6.6 6.8  ALBUMIN 3.7 3.8  AST 25 25  ALT 14 15  ALKPHOS 71 71  BILITOT 0.6 0.6   Lipids  Recent Labs  Lab 02/18/22 0920  CHOL 113  TRIG 62  HDL 39*  LDLCALC 62  CHOLHDL 2.9    Hematology Recent Labs  Lab 02/22/22 1649 02/22/22 1656 02/23/22 0111  WBC 9.7  --  9.7  RBC 4.02*  --  4.13*  HGB 12.8* 13.3 13.0  HCT 40.5 39.0 41.1  MCV 100.7*  --  99.5  MCH 31.8  --  31.5  MCHC 31.6  --  31.6  RDW 14.4  --  14.6  PLT 145*  --  143*   Thyroid  Recent Labs  Lab 02/23/22 1225  TSH 2.442    BNPNo results for input(s): BNP, PROBNP in the last 168 hours.  DDimer No results for input(s): DDIMER in the last 168 hours.   Radiology/Studies:    Assessment and Plan:   Syncope Noted to have had post termination pauses, longest 3.4 seconds on a prior monitor, these suspect to be the cause of his syncope. He does also have NSVT (In setting of an LV aneurysm), though this in review of his chart felt less likely the culprit and given advanced age , PPM felt the more appropriate route.  I discussed with the patient rational for recommendation of PPM, implant procedure, potential risks/benefits, he is agreeable to proceed.  Dr. Caryl Comes will see him later this AM    Risk Assessment/Risk Scores:    For questions or updates, please contact [facility] Please consult [email or web address] for  contact info under    Signed, Baldwin Jamaica, PA-C  02/24/2022 9:13 AM  Syncope-recurrent  Right bundle branch block left anterior fascicular block with intermittent first-degree AV block  Orthostatic lightheadedness  Atrial fibrillation/flutter with posttermination pauses less than 4 seconds  Ventricular tachycardia-nonsustained  Ischemic heart disease with prior bypass surgery prior MI and LV aneurysm  Spontaneous pneumothoraces, pleural effusions  Head trauma and chest trauma with syncope  Dehydrated on arrival  The patient has a history of syncope with a prolonged prodrome and prolonged recovery both of which decrease the likelihood of a primary arrhythmic event and make orthostatic hypotension a more likely event.  The event on Saturday occurred on a very very hot day, his laboratories are consistent with significant intravascular depletion and support of the latter hypophysis.  However, given his history of an aneurysm, prior to device implantation I think it is important to exclude ventricular tachycardia.  Following this, the next discussion would be is inappropriate to implant a loop recorder or a device.  My inclination is towards the former given that my clinical suspicion is that this is neurally mediated/orthostatic syncope and that the 3-second posttermination pause is clinically irrelevant as suggested by the protracted recovery phases of each of his events.  I will discuss this with Dr. Aundra Dubin after the EP study.  There are recent data looking at the value of empiric pacing in patients who have unexplained syncope and bifascicular block.  It has been shown to decrease the risk of recurrent syncope but not decrease the risk of heart endpoints.  I am not sure that those data were applied in this gentleman whose syncope I think is vaso depression as suggested above.  We have discussed the role of the EPS with risks and benefits akin but less than associated with cath    He does not have symptoms to suggest carotid sinus hypersensitivity.

## 2022-02-24 NOTE — Progress Notes (Signed)
Spoke to [employer], pharmacist,  he states Vancomycin allergy was put in 2012,  patient has since received dose of Vancomycin in 2020 and appears did ok with it.  He said if patient does get a little red can be treated with IV Benadryl.  Will notify Dr Sallyanne Kuster and Dr Caryl Comes

## 2022-02-24 NOTE — Progress Notes (Addendum)
Advanced Heart Failure Rounding Note  PCP-Cardiologist: None   Subjective:    No significant bradycardia/ pauses overnight.   Denies ischemic like CP. Has pleuritic chest wall pain post fall.   Denies dyspnea. Waiting potential PPM implant today.    Objective:   Weight Range: 93.2 kg Body mass index is 25.34 kg/m.   Vital Signs:   Temp:  [97.7 F (36.5 C)-98.5 F (36.9 C)] 97.9 F (36.6 C) (06/05 0733) Pulse Rate:  [61-78] 78 (06/05 0443) Resp:  [11-18] 15 (06/05 0733) BP: (103-136)/(40-69) 114/53 (06/05 0733) SpO2:  [94 %-98 %] 97 % (06/04 1628) Weight:  [93.2 kg] 93.2 kg (06/05 0443) Last BM Date : 02/22/22  Weight change: Filed Weights   02/22/22 2003 02/23/22 0426 02/24/22 0443  Weight: 93 kg 93.4 kg 93.2 kg    Intake/Output:   Intake/Output Summary (Last 24 hours) at 02/24/2022 0805 Last data filed at 02/24/2022 0534 Gross per 24 hour  Intake 486.08 ml  Output 1000 ml  Net -513.92 ml      Physical Exam    General:  Well appearing elderly male. No resp difficulty HEENT: Normal Neck: Supple. JVP 8-9 cm . Carotids 2+ bilat; no bruits. No lymphadenopathy or thyromegaly appreciated. Cor: PMI nondisplaced. Regular rate & rhythm. No rubs, gallops or murmurs. Lungs: Clear Abdomen: Soft, nontender, nondistended. No hepatosplenomegaly. No bruits or masses. Good bowel sounds. Extremities: No cyanosis, clubbing, rash, trace b/l LE edema Neuro: Alert & orientedx3, cranial nerves grossly intact. moves all 4 extremities w/o difficulty. Affect pleasant   Telemetry   SR w/ PACs 60s   EKG    N/A   Labs    CBC Recent Labs    02/22/22 1649 02/22/22 1656 02/23/22 0111  WBC 9.7  --  9.7  NEUTROABS 7.3  --  6.3  HGB 12.8* 13.3 13.0  HCT 40.5 39.0 41.1  MCV 100.7*  --  99.5  PLT 145*  --  027*   Basic Metabolic Panel Recent Labs    02/23/22 0111 02/23/22 1920 02/24/22 0613  NA 142  --  138  K 4.0 4.3 4.4  CL 107  --  104  CO2 28  --  28   GLUCOSE 113*  --  110*  BUN 29*  --  24*  CREATININE 1.43*  --  1.22  CALCIUM 9.3  --  9.2  MG 1.9 1.8  --   PHOS 4.7*  --   --    Liver Function Tests Recent Labs    02/22/22 1649 02/23/22 0111  AST 25 25  ALT 14 15  ALKPHOS 71 71  BILITOT 0.6 0.6  PROT 6.6 6.8  ALBUMIN 3.7 3.8   No results for input(s): LIPASE, AMYLASE in the last 72 hours. Cardiac Enzymes No results for input(s): CKTOTAL, CKMB, CKMBINDEX, TROPONINI in the last 72 hours.  BNP: BNP (last 3 results) Recent Labs    11/15/21 1301 12/31/21 0902 01/16/22 1124  BNP 331.5* 341.1* 253.2*    ProBNP (last 3 results) No results for input(s): PROBNP in the last 8760 hours.   D-Dimer No results for input(s): DDIMER in the last 72 hours. Hemoglobin A1C No results for input(s): HGBA1C in the last 72 hours. Fasting Lipid Panel No results for input(s): CHOL, HDL, LDLCALC, TRIG, CHOLHDL, LDLDIRECT in the last 72 hours. Thyroid Function Tests Recent Labs    02/23/22 1225  TSH 2.442    Other results:   Imaging    ECHOCARDIOGRAM LIMITED  Result Date: 02/23/2022    ECHOCARDIOGRAM LIMITED REPORT   Patient Name:   Eric Gordon Date of Exam: 02/23/2022 Medical Rec #:  [id number]           Height:       75.5 in Accession #:    [id number]          Weight:       205.9 lb Date of Birth:  April 07, 1931          BSA:          2.233 m Patient Age:    44 years            BP:           103/40 mmHg Patient Gender: M                   HR:           57 bpm. Exam Location:  Inpatient Procedure: Limited Echo, Cardiac Doppler, Color Doppler and Intracardiac            Opacification Agent Indications:    R55 Syncope  History:        Patient has prior history of Echocardiogram examinations, most                 recent 01/01/2022. CAD and Previous Myocardial Infarction, Prior                 CABG, Aortic Valve Disease, Signs/Symptoms:Syncope, Dyspnea,                 Shortness of Breath and Dizziness/Lightheadedness; Risk                  Factors:Sleep Apnea and Hypertension.  Sonographer:    Roseanna Rainbow RDCS Referring Phys: [id number] [place]  Sonographer Comments: Technically difficult study due to poor echo windows. Patient supine, could not turn due to recent fall. IMPRESSIONS  1. There is a small aneurysm of the basal inferolateral wall. Thrombus is not seen. Left ventricular ejection fraction, by estimation, is 40 to 45%. The left ventricle has mildly decreased function. The left ventricle demonstrates regional wall motion abnormalities (see scoring diagram/findings for description). There is mild concentric left ventricular hypertrophy. Left ventricular diastolic function could not be evaluated.  2. Right ventricular systolic function is normal. The right ventricular size is normal. There is normal pulmonary artery systolic pressure. The estimated right ventricular systolic pressure is 80.1 mmHg.  3. 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 A small partly mobile calcified component may be present. The mitral valve is degenerative. No evidence of mitral stenosis. Moderate to severe mitral annular calcification.  4. Tricuspid valve regurgitation is mild to moderate.  5. The aortic valve is tricuspid. There is moderate calcification of the aortic valve. There is moderate thickening of the aortic valve. Aortic valve regurgitation is not visualized. Mild to moderate aortic valve stenosis.  6. There is borderline dilatation of the ascending aorta, measuring 39 mm.  7. The inferior vena cava is normal in size with greater than 50% respiratory variability, suggesting right atrial pressure of 3 mmHg. Comparison(s): No significant change from prior study. Prior images reviewed side by side. FINDINGS  Left Ventricle: There is a small aneurysm of the basal inferolateral wall. Thrombus is not seen. Left ventricular ejection fraction, by estimation, is 40 to 45%. The left ventricle has mildly decreased function. The left ventricle  demonstrates regional wall motion abnormalities. Definity  contrast agent was given IV to delineate the left ventricular endocardial borders. The left ventricular internal cavity size was normal in size. There is mild concentric left ventricular hypertrophy. Left ventricular diastolic function could not be evaluated.  LV Wall Scoring: The basal inferolateral segment is aneurysmal. The mid inferolateral segment and basal anterolateral segment are akinetic. Right Ventricle: The right ventricular size is normal. No increase in right ventricular wall thickness. Right ventricular systolic function is normal. There is normal pulmonary artery systolic pressure. The tricuspid regurgitant velocity is 2.18 m/s, and  with an assumed right atrial pressure of 3 mmHg, the estimated right ventricular systolic pressure is 41.9 mmHg. Pericardium: There is no evidence of pericardial effusion. 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 A small partly mobile calcified component may be present. The mitral valve is degenerative in appearance. Moderate to severe mitral annular calcification. No evidence of mitral valve stenosis. Tricuspid Valve: The tricuspid valve is normal in structure. Tricuspid valve regurgitation is mild to moderate. Aortic Valve: The aortic valve is tricuspid. There is moderate calcification of the aortic valve. There is moderate thickening of the aortic valve. Aortic valve regurgitation is not visualized. Mild to moderate aortic stenosis is present. Aortic valve mean gradient measures 18.6 mmHg. Aortic valve peak gradient measures 32.9 mmHg. Aortic valve area, by VTI measures 1.42 cm. Pulmonic Valve: The pulmonic valve was normal in structure. Pulmonic valve regurgitation is trivial. Aorta: The aortic root is normal in size and structure. There is borderline dilatation of the ascending aorta, measuring 39 mm. Venous: The inferior vena cava is normal in size with greater than 50%  respiratory variability, suggesting right atrial pressure of 3 mmHg. LEFT VENTRICLE PLAX 2D LVIDd:         5.80 cm LVIDs:         4.30 cm LV PW:         1.40 cm LV IVS:        1.30 cm LVOT diam:     2.20 cm LV SV:         94 LV SV Index:   42 LVOT Area:     3.80 cm  LV Volumes (MOD) LV vol d, MOD A4C: 152.0 ml LV vol s, MOD A4C: 87.6 ml LV SV MOD A4C:     152.0 ml LEFT ATRIUM         Index LA diam:    4.00 cm 1.79 cm/m  AORTIC VALVE AV Area (Vmax):    1.51 cm AV Area (Vmean):   1.43 cm AV Area (VTI):     1.42 cm AV Vmax:           286.80 cm/s AV Vmean:          200.400 cm/s AV VTI:            0.657 m AV Peak Grad:      32.9 mmHg AV Mean Grad:      18.6 mmHg LVOT Vmax:         114.00 cm/s LVOT Vmean:        75.600 cm/s LVOT VTI:          0.246 m LVOT/AV VTI ratio: 0.37  AORTA Ao Root diam: 3.70 cm Ao Asc diam:  3.90 cm TRICUSPID VALVE TR Peak grad:   19.0 mmHg TR Vmax:        218.00 cm/s  SHUNTS Systemic VTI:  0.25 m Systemic Diam: 2.20 cm Sanda Klein MD Electronically signed  by Sanda Klein MD Signature Date/Time: 02/23/2022/11:14:27 AM    Final      Medications:     Scheduled Medications:  atorvastatin  80 mg Oral Daily   gentamicin irrigation  80 mg Irrigation On Call   levothyroxine  75 mcg Oral QAC breakfast   pantoprazole  40 mg Oral Daily   tamsulosin  0.4 mg Oral QPC supper    Infusions:  sodium chloride 50 mL/hr at 02/24/22 0526   vancomycin      PRN Medications: acetaminophen, melatonin, polyethylene glycol, prochlorperazine, traMADol    Patient Profile   Eric Gordon is a 88 y.o. male with CAD s/p CABG, DOE, spontaneous PTX, b/l CAS s/p R/L TCAR (2021/2022), AAA, OSA, prior PE, AF and tremor, admitted w/ syncope. Suspected to be cardiogenic. Known to have tachybradycardia syndrome with recent outpatient monitor showing postconversion symptomatic pauses.   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   Assessment/Plan    1. Syncope: most likely cardiogenic. Known tachybradycardia syndrome with recent outpatient monitor showing postconversion symptomatic pauses, up to 3 sec long. Has bifascicular block.  Echo EF 40-45% and pre-existing aneurysm of the basal inferolateral wall, mild-mod AS - EP to see today for possible PPM 2. CAD: s/p CABG 3/12. NSTEMI 1/20, LHC showed new occlusion of the branch of SVG-ramus and OM that touched down on OM.  There were also serial 70% stenoses in the mid/distal RCA.  FFR of RCA was negative.  No interventional target.  Cardiolite in 12/21 showed inferolateral infarction with no ischemia, consistent with findings on NSTEMI in 1/20.  Cardiolite in 3/23 showed inferior/inferolateral fixed defect with no ischemia.  No recent ischemic CP  - No ASA given Eliquis use.  - Continue atorvastatin 80 daily.   - Off ranolazine with prolonged QTc.  3. Hyperlipidemia: On atorvastatin 80 daily 4. Chronic HF with mid range EF: TEE in 7/20 showed EF 50-55%, basal inferolateral aneurysm, and normal RV.  Echo in 1/22 with EF 40-45%, basal-mid inferolateral akinesis and inferior hypokinesis, normal RV.  Echo was stable in 4/23 with EF 40-45%, akinesis of the inferolateral wall, basal inferolateral pseudoaneurysm, moderate RV enlargement, normal RV systolic function, mild AS with mean gradient 10 mmHg. He is not volume overloaded on exam or by REDS clip (22%).  NYHA class II, I think that his dyspnea is primarily related to his left lung disease (mild and stable).  He was unable to tolerate dapagliflozin and became lightheaded/hypotensive with Entresto.  Coreg had to be stopped due to orthostasis.  - Cannot get valsartan in hospital, hold for now.  - Restart spironolactone 25 mg daily. BMET today.  - He has Lasix for prn use. Looks mildly volume up. Give dose PO Lasix today 20 mg 5. Spontaneous left PTX with recurrent left-sided pleural effusion:  He is s/p left VATS/pleurodesis in 7/20.    6. Carotid  stenosis: S/p bilateral TCARs, followed at [facility].     7. AAA: 4.5 cm AAA in 9/22, plan repeat AAA [place] as outpatient.      8. Aortic stenosis: mild-mod on echo this admit  9. Atrial fibrillation: Paroxysmal. He is significantly symptomatic while in atrial fibrillation.  He is in NSR today.  He is off amiodarone due to concern for pulmonary toxicity.  He saw EP, did not recommend ablation. He would likely be a Tikosyn or sotalol candidate but would have to come into the hospital for it.  As he has had  minimal atrial fibrillation recently, hold off Tikosyn/sotalol for now. NSR today.  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  This has been stable, not candidate for surgery.     Length of Stay: [place], PA-C  02/24/2022, 8:05 AM  Advanced Heart Failure Team Pager [phone] (M-F; 7a - 5p)  Please contact [facility] for night-coverage after hours (5p -7a ) and weekends on [email or web address]  Patient seen with PA, agree with the above note.   Had brief SVT on monitor but no pauses.    Complains of pain in right chest and in the back of his head (areas injured in fall after syncope).    General: NAD Neck: JVP 9-10 cm, no thyromegaly or thyroid nodule.  Lungs: Clear to auscultation bilaterally with normal respiratory effort. CV: Nondisplaced PMI.  Heart regular S1/S2, no S3/S4, 2/6 early SEM RUSB.  1+ ankle edema.  Abdomen: Soft, nontender, no hepatosplenomegaly, no distention.  Skin: Intact without lesions or rashes.  Neurologic: Alert and oriented x 3.  Psych: Normal affect. Extremities: No clubbing or cyanosis.  HEENT: Normal.   Patient has RBBB with LAFB (bifascicular block), also has history of up to 3 second post-termination pauses with PAF.  With unexplained syncope, agree that PPM is appropriate (planned for today).    Patient has chronic ischemic CMP with EF 40-45% and chronic basal inferolateral wall pseudoaneurysm.  Think  syncope in this instance is more likely due to prolonged pause than VT.  At 55 and without clear evidence for VT causing syncope, think we can hold off on ICD.   Mild volume overload.  Will restart home spironolactone and give Lasix 20 mg IV x 1.   [name] 02/24/2022 8:49 AM

## 2022-02-24 NOTE — Care Plan (Signed)
Called to wife, both numbers, no answer.

## 2022-02-24 NOTE — Progress Notes (Signed)
Pt with inducible VT  Have spoken with Dr DM Recommend dual chamber device implantation, HV or LV with amio , would favor the former and with the hx also suggestive of vasomotor component , would use BIOTRONIK WITH CLS  NO DRIVING X 6 MONTH

## 2022-02-24 NOTE — Plan of Care (Signed)

## 2022-02-24 NOTE — TOC CAGE-AID Note (Signed)
Transition of Care [facility]) - CAGE-AID Screening   Patient Details  Name: TREAVON CASTILLEJA MRN: [id number] Date of Birth: [birth year]  Transition of Care [facility]) CM/SW Contact:    Lula Michaux C Tarpley-Carter, LCSWA Phone Number: 02/24/2022, 9:31 AM   Clinical Narrative: Pt participated in [place].  Pt stated he does not use substance or ETOH.  Pt was not offered resources, due to no usage of substance or ETOH.    Almina Schul Tarpley-Carter, MSW, LCSW-A Pronouns:  She/Her/Hers [facility] HealthTransitions of Care Clinical Social Worker Direct Number:  [phone] [email or web address]  CAGE-AID Screening:    Have You Ever Felt You Ought to Cut Down on Your Drinking or Drug Use?: No Have People Annoyed You By [employer] Your Drinking Or Drug Use?: No Have You Felt Bad Or Guilty About Your Drinking Or Drug Use?: No Have You Ever Had a Drink or Used Drugs First Thing In The Morning to Steady Your Nerves or to Get Rid of a Hangover?: No CAGE-AID Score: 0  Substance Abuse Education Offered: No

## 2022-02-24 NOTE — Progress Notes (Signed)
  Progress Note   Patient: Eric Gordon WUJ:[id number] DOB: 06-06-1931 DOA: 02/22/2022     1 DOS: the patient was seen and examined on 02/24/2022 at 9:35AM      Brief hospital course: Mr. Keenum is an 88 y.o. M with sCHF, CAD s/p CABG remote, hx LV pseudoaneurysm, hx previous spontaneous pneumothorax, HTN, mild AS, pAF on Eliquis, hypothyroidism and PVD (carotid and AAA) who presented with several episodes syncope.   6/3: Admitted on tele 6/4: Cardiology consulted, noted recent monitoring with 3s pause and bifascicular block, recommended EP evaluation 6/5: Started on Lasix by HF team, plan for PPM insertion later     Assessment and Plan: Syncope Placed on monitoring, no events since admission.  Patient recently had long-term electrocardiographic monitoring that showed several 3-second postconversion pauses.  In the setting of a bifascicular block, now recurrent syncope, cardiology feel that he is a pacemaker candidate and will discuss with EP. - [place] cardiology, plan for PPM today  Acute on chronic combined systolic and diastolic CHF (congestive heart failure) ([place]) Still hypoxic and Cardiology feel he is volume overloaded. - Gentle IV Lasix - Resume spironolactone - Hold valsartan - Hold BB given pauses   PVD (peripheral vascular disease) carotid - Continue statin   Hyperlipidemia - Continue atorvastatin  AKI (acute kidney injury) (HCC) Baseline 0.9-1.1, here was 1.7 up to 1.9 on admission.  Now resolved.  Pseudoaneurysm of left ventricle of heart    Hypothyroidism - Continue levothyroxine  Mild aortic stenosis    Paroxysmal atrial fibrillation (HCC) - Hold Eliquis for PPM, resume when appropriate after, defer to EP - Consult Cardiology - Hold Coreg  CAD s/p CABG 3/12  - Hold Imdur, valsartan given soft blood pressure - Continue atorvastatin   Abdominal aortic aneurysm (HCC) - Continue atorvastatin  Essential hypertension Blood  pressure normal - Hold Imdur, valsartan, Coreg - Resume furosemide, spironolactone          Subjective: Patient still has a lot of pain in his right chest and back.  No fever, no sputum.  Placed on oxygen.     Physical Exam: Vitals:   02/23/22 2349 02/24/22 0443 02/24/22 0733 02/24/22 1122  BP:  136/69 (!) 114/53 (!) 116/44  Pulse: 69 78    Resp: '18 18 15 14  '$ Temp: 98.4 F (36.9 C) 98.1 F (36.7 C) 97.9 F (36.6 C) 97.7 F (36.5 C)  TempSrc: Oral Oral    SpO2:      Weight:  93.2 kg    Height:       Elderly adult male, tender to palpation in the back and the chest, no confusion Heart rate normal, rhythm regular, soft systolic murmur, mild nonpitting LE edema Respiratory effort shallow but lungs clear I do not appreciate rales or wheezing Attention normal, affect appropriate, somewhat blunted by pain, judgment insight appear normal for age    Data Reviewed: Cardiology notes reviewed, nursing notes reviewed, vital signs reviewed Basic metabolic panel unremarkable     Disposition: Status is: Inpatient The patient was admitted for syncope, evaluated by cardiology for here who felt this was likely from cardiac pauses and recommended pacemaker.  Pacemaker likely placed today, cardiology will diurese today, hopefully home in the next 1 to 2 days pending response to diuretic        Author: Edwin Dada, MD 02/24/2022 1:17 PM  For on call review [email or web address].

## 2022-02-24 NOTE — Progress Notes (Signed)
Physical Therapy Treatment Patient Details Name: Eric Gordon MRN: [id number] DOB: 1931/04/29 Today's Date: 02/24/2022   History of Present Illness Eric Gordon is a 88 y.o. male presented to [facility] ED from home after a syncopal episode while working on his deck. Plan for pacemaker 02/24/22. PHMx:  paroxysmal A-fib, combined diastolic and systolic CHF with LVEF 51-70% grade 1 diastolic dysfunction, AAA status postrepair, bilateral carotid stenosis status post stenting, hypertension, hyperlipidemia, hypothyroidism, BPH, recently admitted in April 2023 where he was found to have a large left ventricular pseudoaneurysm.    PT Comments    Patient progressing slowly towards PT goals. Session focused on functional mobility and gait progression. Requires Mod A for bed mobility and Min A for standing and gait training with use of RW for support. Noted to have very slow and guarded gait due to pain in back/chest.  Education on back precautions for comfort, log roll technique and pursed lip breathing. HR ranging from low 50s-85 bpm with activity. Will plan to follow up post PPM to review precautions and reassess mobility. Plan is to return home with support of wife. Will follow.   Recommendations for follow up therapy are one component of a multi-disciplinary discharge planning process, led by the attending physician.  Recommendations may be updated based on patient status, additional functional criteria and insurance authorization.  Follow Up Recommendations  Home health PT     Assistance Recommended at Discharge Intermittent Supervision/Assistance  Patient can return home with the following A little help with walking and/or transfers;A little help with bathing/dressing/bathroom;Assistance with cooking/housework;Help with stairs or ramp for entrance;Assist for transportation   Equipment Recommendations  None recommended by PT    Recommendations for Other Services       Precautions /  Restrictions Precautions Precautions: Fall;Other (comment) Precaution Comments: watch HR/02 Restrictions Weight Bearing Restrictions: No     Mobility  Bed Mobility Overal bed mobility: Needs Assistance Bed Mobility: Rolling, Sidelying to Sit, Sit to Supine Rolling: Mod assist Sidelying to sit: Mod assist, HOB elevated   Sit to supine: Mod assist, HOB elevated   General bed mobility comments: Cues for log roll technique and to reach for rail, assist with LEs and trunk to get to EOB, assist with BLEs to return to supine    Transfers Overall transfer level: Needs assistance Equipment used: Rolling walker (2 wheels) Transfers: Sit to/from Stand Sit to Stand: Min assist, From elevated surface           General transfer comment: Min A to power to standing with cues for hand placement/technique. Stood from [employer], cues for upright/knee extension, pain.    Ambulation/Gait Ambulation/Gait assistance: Min assist Gait Distance (Feet): 75 Feet Assistive device: Rolling walker (2 wheels) Gait Pattern/deviations: Step-through pattern, Decreased stride length, Trunk flexed Gait velocity: decreased Gait velocity interpretation: <1.31 ft/sec, indicative of household ambulator   General Gait Details: Slow, mildly unsteady gait with short step lengths bilaterally and multiple rest breaks due to pain in back. HR ranging from low 50s-85 bpm A-fib.   Stairs             Wheelchair Mobility    Modified Rankin (Stroke Patients Only)       Balance Overall balance assessment: Mild deficits observed, not formally tested  Cognition Arousal/Alertness: Awake/alert Behavior During Therapy: WFL for tasks assessed/performed Overall Cognitive Status: Within Functional Limits for tasks assessed                                 General Comments: for basic mobility tasks        Exercises      General Comments  General comments (skin integrity, edema, etc.): Wife present during session. HR ranging from low 50s-85 bpm Afib.      Pertinent Vitals/Pain Pain Assessment Pain Assessment: Faces Faces Pain Scale: Hurts even more Pain Location: back, neck, everywhere Pain Descriptors / Indicators: Discomfort, Grimacing, Guarding, Sore Pain Intervention(s): Monitored during session, Repositioned, Limited activity within patient's tolerance    Home Living                          Prior Function            PT Goals (current goals can now be found in the care plan section) Progress towards PT goals: Progressing toward goals    Frequency    Min 3X/week      PT Plan Current plan remains appropriate    Co-evaluation              AM-PAC PT "6 Clicks" Mobility   Outcome Measure  Help needed turning from your back to your side while in a flat bed without using bedrails?: A Little Help needed moving from lying on your back to sitting on the side of a flat bed without using bedrails?: A Lot Help needed moving to and from a bed to a chair (including a wheelchair)?: A Lot Help needed standing up from a chair using your arms (e.g., wheelchair or bedside chair)?: A Little Help needed to walk in hospital room?: A Little Help needed climbing 3-5 steps with a railing? : A Lot 6 Click Score: 15    End of Session Equipment Utilized During Treatment: Gait belt;Oxygen Activity Tolerance: Patient limited by pain;Patient tolerated treatment well Patient left: in bed;with call bell/phone within reach;with bed alarm set;with family/visitor present Nurse Communication: Mobility status PT Visit Diagnosis: Unsteadiness on feet (R26.81);Muscle weakness (generalized) (M62.81);Repeated falls (R29.6);Difficulty in walking, not elsewhere classified (R26.2);Pain Pain - part of body:  (back)     Time: 1201-1220 PT Time Calculation (min) (ACUTE ONLY): 19 min  Charges:  $Gait Training: 8-22  mins                     Marisa Severin, PT, DPT Acute Rehabilitation Services Secure chat preferred Office [place] 02/24/2022, 3:19 PM

## 2022-02-25 ENCOUNTER — Encounter (HOSPITAL_COMMUNITY)
Admission: EM | Disposition: A | Discharge status: Home Health | Payer: Self-pay | Source: Home / Self Care | Attending: Family Medicine

## 2022-02-25 ENCOUNTER — Encounter (HOSPITAL_COMMUNITY): Payer: Self-pay | Admitting: Internal Medicine

## 2022-02-25 DIAGNOSIS — I255 Ischemic cardiomyopathy: Secondary | ICD-10-CM

## 2022-02-25 DIAGNOSIS — S0191XA Laceration without foreign body of unspecified part of head, initial encounter: Secondary | ICD-10-CM

## 2022-02-25 DIAGNOSIS — S20219A Contusion of unspecified front wall of thorax, initial encounter: Secondary | ICD-10-CM

## 2022-02-25 HISTORY — PX: ICD IMPLANT: EP1208

## 2022-02-25 LAB — BASIC METABOLIC PANEL
Anion gap: 7 (ref 5–15)
BUN: 22 mg/dL (ref 8–23)
CO2: 24 mmol/L (ref 22–32)
Calcium: 8.9 mg/dL (ref 8.9–10.3)
Chloride: 105 mmol/L (ref 98–111)
Creatinine, Ser: 1.12 mg/dL (ref 0.61–1.24)
GFR, Estimated: >60 mL/min (ref >60)
Glucose, Bld: 123 mg/dL — ABNORMAL HIGH (ref 70–99)
Potassium: 4.2 mmol/L (ref 3.5–5.1)
Sodium: 136 mmol/L (ref 135–145)

## 2022-02-25 LAB — SURGICAL PCR SCREEN
MRSA, PCR: NEGATIVE
Staphylococcus aureus: NEGATIVE

## 2022-02-25 LAB — MAGNESIUM: Magnesium: 1.6 mg/dL — ABNORMAL LOW (ref 1.7–2.4)

## 2022-02-25 SURGERY — ICD IMPLANT

## 2022-02-25 MED ORDER — SODIUM CHLORIDE 0.9 % IV SOLN
80.0000 mg | INTRAVENOUS | Status: AC
  Administered 2022-02-25: 80 mg
  Filled 2022-02-25: qty 2

## 2022-02-25 MED ORDER — CHLORHEXIDINE GLUCONATE 4 % EX LIQD
60.0000 mL | Freq: Once | CUTANEOUS | Status: AC
  Filled 2022-02-25: qty 60

## 2022-02-25 MED ORDER — LIDOCAINE HCL 1 % IJ SOLN
INTRAMUSCULAR | Status: AC
  Filled 2022-02-25: qty 60

## 2022-02-25 MED ORDER — MIDAZOLAM HCL 5 MG/5ML IJ SOLN
INTRAMUSCULAR | Status: AC
  Filled 2022-02-25: qty 5

## 2022-02-25 MED ORDER — MAGNESIUM SULFATE 4 GM/100ML IV SOLN
4.0000 g | Freq: Once | INTRAVENOUS | Status: AC
  Administered 2022-02-25: 4 g via INTRAVENOUS
  Filled 2022-02-25: qty 100

## 2022-02-25 MED ORDER — SODIUM CHLORIDE 0.9 % IV SOLN
INTRAVENOUS | Status: AC
  Filled 2022-02-25: qty 2

## 2022-02-25 MED ORDER — VANCOMYCIN HCL IN DEXTROSE 1-5 GM/200ML-% IV SOLN
INTRAVENOUS | Status: AC
  Filled 2022-02-25: qty 200

## 2022-02-25 MED ORDER — FENTANYL CITRATE (PF) 100 MCG/2ML IJ SOLN
INTRAMUSCULAR | Status: DC | PRN
  Administered 2022-02-25 (×2): 25 ug via INTRAVENOUS

## 2022-02-25 MED ORDER — MIDAZOLAM HCL 5 MG/5ML IJ SOLN
INTRAMUSCULAR | Status: DC | PRN
  Administered 2022-02-25 (×2): 1 mg via INTRAVENOUS

## 2022-02-25 MED ORDER — HEPARIN (PORCINE) IN NACL 1000-0.9 UT/500ML-% IV SOLN
INTRAVENOUS | Status: DC | PRN
  Administered 2022-02-25: 500 mL

## 2022-02-25 MED ORDER — MAGNESIUM SULFATE 50 % IJ SOLN: 3.0000 g | Freq: Once | INTRAVENOUS | Status: DC

## 2022-02-25 MED ORDER — SODIUM CHLORIDE 0.9 % IV SOLN: INTRAVENOUS | Status: DC

## 2022-02-25 MED ORDER — CHLORHEXIDINE GLUCONATE 4 % EX LIQD
60.0000 mL | Freq: Once | CUTANEOUS | Status: AC
  Administered 2022-02-25: 4 via TOPICAL
  Filled 2022-02-25: qty 60

## 2022-02-25 MED ORDER — HEPARIN (PORCINE) IN NACL 1000-0.9 UT/500ML-% IV SOLN
INTRAVENOUS | Status: AC
  Filled 2022-02-25: qty 500

## 2022-02-25 MED ORDER — VANCOMYCIN HCL IN DEXTROSE 1-5 GM/200ML-% IV SOLN
1000.0000 mg | INTRAVENOUS | Status: DC
  Administered 2022-02-25: 1000 mg via INTRAVENOUS
  Filled 2022-02-25: qty 200

## 2022-02-25 MED ORDER — LIDOCAINE HCL (PF) 1 % IJ SOLN
INTRAMUSCULAR | Status: DC | PRN
  Administered 2022-02-25: 60 mL

## 2022-02-25 MED ORDER — FENTANYL CITRATE (PF) 100 MCG/2ML IJ SOLN
INTRAMUSCULAR | Status: AC
  Filled 2022-02-25: qty 2

## 2022-02-25 MED FILL — Heparin Sod (Porcine)-NaCl IV Soln 1000 Unit/500ML-0.9%: INTRAVENOUS | Qty: 500 | Status: AC

## 2022-02-25 SURGICAL SUPPLY — 10 items
CABLE SURGICAL S-101-97-12 (CABLE) ×3 IMPLANT
ICD RIVACOR DR-T (ICD Generator) ×1 IMPLANT
LEAD PLEXA S65 402266 (Lead) ×1 IMPLANT
LEAD SOLIA S PRO MRI 53 (Lead) ×1 IMPLANT
MAT PREVALON FULL STRYKER (MISCELLANEOUS) ×1 IMPLANT
PAD DEFIB RADIO PHYSIO CONN (PAD) ×3 IMPLANT
SHEATH 7FR PRELUDE SNAP 13 (SHEATH) ×1 IMPLANT
SHEATH 8FR PRELUDE SNAP 13 (SHEATH) ×1 IMPLANT
SHEATH PROBE COVER 6X72 (BAG) ×1 IMPLANT
TRAY PACEMAKER INSERTION (PACKS) ×3 IMPLANT

## 2022-02-25 NOTE — Progress Notes (Signed)
RN was notified by pt wife that R arm has been swollen and red for a couple of hours. Pt and wife is unsure about when it started but stated it started sometime earlier today. Pt arm was red, swollen, and warm to touch around the IV site. NS was running at 10 but was stopped. On call MD was notified. Was told to remove IV, elevate extremity, and apply warm compress. Will carry out orders and monitor pt.

## 2022-02-25 NOTE — Progress Notes (Addendum)
Advanced Heart Failure Rounding Note  PCP-Cardiologist: None   Subjective:    EP study yesterday w/ inducible VT. Will need dual chamber ICD. Planning implant today.   No further syncope. No pauses or VT on tele. HR currently in 60s.   Feels well this morning. Sitting up eating breakfast. Denies CP and dyspnea.     Objective:   Weight Range: 94.3 kg Body mass index is 25.64 kg/m.   Vital Signs:   Temp:  [97.5 F (36.4 C)-98.2 F (36.8 C)] 97.5 F (36.4 C) (06/06 0316) Pulse Rate:  [58-79] 60 (06/06 0316) Resp:  [12-28] 20 (06/06 0316) BP: (113-152)/(44-74) 152/63 (06/06 0316) SpO2:  [80 %-100 %] 97 % (06/06 0316) Weight:  [94.3 kg] 94.3 kg (06/06 0500) Last BM Date : 02/22/22  Weight change: Filed Weights   02/23/22 0426 02/24/22 0443 02/25/22 0500  Weight: 93.4 kg 93.2 kg 94.3 kg    Intake/Output:   Intake/Output Summary (Last 24 hours) at 02/25/2022 0811 Last data filed at 02/25/2022 0318 Gross per 24 hour  Intake 554.07 ml  Output 350 ml  Net 204.07 ml      Physical Exam    General:  Well appearing elderly male, sitting up on side of bed. No respiratory difficulty HEENT: normal Neck: supple. no JVD. Carotids 2+ bilat; no bruits. No lymphadenopathy or thyromegaly appreciated. Cor: PMI nondisplaced. Regular rate & rhythm. No rubs, gallops or murmurs. Lungs: clear Abdomen: soft, nontender, nondistended. No hepatosplenomegaly. No bruits or masses. Good bowel sounds. Extremities: no cyanosis, clubbing, rash, edema Neuro: alert & oriented x 3, cranial nerves grossly intact. moves all 4 extremities w/o difficulty. Affect pleasant.  Telemetry   SR w/ PACs 60s. No pauses. No VT   EKG    N/A   Labs    CBC Recent Labs    02/22/22 1649 02/22/22 1656 02/23/22 0111  WBC 9.7  --  9.7  NEUTROABS 7.3  --  6.3  HGB 12.8* 13.3 13.0  HCT 40.5 39.0 41.1  MCV 100.7*  --  99.5  PLT 145*  --  557*   Basic Metabolic Panel Recent Labs    02/23/22 0111  02/23/22 1920 02/24/22 0613 02/25/22 0154  NA 142  --  138 136  K 4.0 4.3 4.4 4.2  CL 107  --  104 105  CO2 28  --  28 24  GLUCOSE 113*  --  110* 123*  BUN 29*  --  24* 22  CREATININE 1.43*  --  1.22 1.12  CALCIUM 9.3  --  9.2 8.9  MG 1.9 1.8  --  1.6*  PHOS 4.7*  --   --   --    Liver Function Tests Recent Labs    02/22/22 1649 02/23/22 0111  AST 25 25  ALT 14 15  ALKPHOS 71 71  BILITOT 0.6 0.6  PROT 6.6 6.8  ALBUMIN 3.7 3.8   No results for input(s): LIPASE, AMYLASE in the last 72 hours. Cardiac Enzymes No results for input(s): CKTOTAL, CKMB, CKMBINDEX, TROPONINI in the last 72 hours.  BNP: BNP (last 3 results) Recent Labs    11/15/21 1301 12/31/21 0902 01/16/22 1124  BNP 331.5* 341.1* 253.2*    ProBNP (last 3 results) No results for input(s): PROBNP in the last 8760 hours.   D-Dimer No results for input(s): DDIMER in the last 72 hours. Hemoglobin A1C No results for input(s): HGBA1C in the last 72 hours. Fasting Lipid Panel No results for input(s): CHOL,  HDL, LDLCALC, TRIG, CHOLHDL, LDLDIRECT in the last 72 hours. Thyroid Function Tests Recent Labs    02/23/22 1225  TSH 2.442    Other results:   Imaging    No results found.   Medications:     Scheduled Medications:  atorvastatin  80 mg Oral Daily   furosemide  20 mg Intravenous Once   levothyroxine  75 mcg Oral QAC breakfast   pantoprazole  40 mg Oral Daily   sodium chloride flush  3 mL Intravenous Q12H   spironolactone  25 mg Oral Daily   tamsulosin  0.4 mg Oral QPC supper    Infusions:  sodium chloride      PRN Medications: sodium chloride, acetaminophen, melatonin, ondansetron (ZOFRAN) IV, polyethylene glycol, prochlorperazine, sodium chloride flush, traMADol    Patient Profile   Eric Gordon is a 88 y.o. male with CAD s/p CABG, DOE, spontaneous PTX, b/l CAS s/p R/L TCAR (2021/2022), AAA, OSA, prior PE, AF and tremor, admitted w/ syncope. Suspected to be  cardiogenic. Known to have tachybradycardia syndrome with recent outpatient monitor showing postconversion symptomatic pauses. EP study this admit also showed inducible VT.   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   Assessment/Plan   1. Syncope: most likely cardiogenic. Known tachybradycardia syndrome with recent outpatient monitor showing postconversion symptomatic pauses, up to 3 sec long. Has bifascicular block.  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 EP study this admit w/ inducible VT - D/w Dr. Caryl Comes. Plan for dual chamber device implantation today  2. CAD: s/p CABG 3/12. NSTEMI 1/20, LHC showed new occlusion of the branch of SVG-ramus and OM that touched down on OM.  There were also serial 70% stenoses in the mid/distal RCA.  FFR of RCA was negative.  No interventional target.  Cardiolite in 12/21 showed inferolateral infarction with no ischemia, consistent with findings on NSTEMI in 1/20.  Cardiolite in 3/23 showed inferior/inferolateral fixed defect with no ischemia.  No recent ischemic CP  - No ASA given Eliquis use.  - Continue atorvastatin 80 daily.   - Off ranolazine with prolonged QTc.  3. Hyperlipidemia: On atorvastatin 80 daily 4. Chronic HF with mid range EF: TEE in 7/20 showed EF 50-55%, basal inferolateral aneurysm, and normal RV.  Echo in 1/22 with EF 40-45%, basal-mid inferolateral akinesis and inferior hypokinesis, normal RV.  Echo was stable in 4/23 with EF 40-45%, akinesis of the inferolateral wall, basal inferolateral pseudoaneurysm, moderate RV enlargement, normal RV systolic function, mild AS with mean gradient 10 mmHg. He is not volume overloaded on exam or by REDS clip (22%).  NYHA class II, I think that his dyspnea is primarily related to his left lung disease (mild and stable).  He was unable to tolerate dapagliflozin and became lightheaded/hypotensive with Entresto.  Coreg had  to be stopped due to orthostasis.  - Cannot get valsartan in hospital, hold for now.  - Continue spironolactone 25 mg daily.   - Volume improved on exam today. No further IV Lasix  5. Spontaneous left PTX with recurrent left-sided pleural effusion:  He is s/p left VATS/pleurodesis in 7/20.    6. Carotid stenosis: S/p bilateral TCARs, followed at [facility].     7. AAA: 4.5 cm AAA in 9/22, plan repeat AAA [place] as outpatient.      8. Aortic stenosis: mild-mod on echo this admit  9. Atrial fibrillation: Paroxysmal. He is significantly symptomatic while in atrial fibrillation.  He is in NSR today.  He is off amiodarone due to concern for pulmonary toxicity.  He saw EP, did not recommend ablation. He would likely be a Tikosyn or sotalol candidate but would have to come into the hospital for it.  As he has had minimal atrial fibrillation recently, hold off Tikosyn/sotalol for now. NSR today.  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  This has been stable, not candidate for surgery.  11. VT: EP study this admit w/ inducible VT - Dual chamber ICD today.  - No driving x 6 months.     Length of Stay: 2  Lyda Jester, PA-C  02/25/2022, 8:11 AM  Advanced Heart Failure Team Pager [phone] (M-F; 7a - 5p)  Please contact [facility] for night-coverage after hours (5p -7a ) and weekends on [email or web address]  Patient seen with PA, agree with the above note.   He is stable symptomatically, no complaints today.    ECG this morning shows NSR with bifascicular block.    EPS yesterday with inducible VT, patient has known chronic inferolateral PSA.   General: NAD Neck: No JVD, no thyromegaly or thyroid nodule.  Lungs: Clear to auscultation bilaterally with normal respiratory effort. CV: Nondisplaced PMI.  Heart regular S1/S2, no S3/S4, no murmur.  No peripheral edema.   Abdomen: Soft, nontender, no hepatosplenomegaly, no distention.  Skin: Intact without  lesions or rashes.  Neurologic: Alert and oriented x 3.  Psych: Normal affect. Extremities: No clubbing or cyanosis.  HEENT: Normal.   Volume status looks ok today, does not need Lasix. Continue home spironolactone, can resume home valsartan after discharge.   Inducible VT on EPS, also with bifascicular block with post-termination pauses when he goes into AF.  Syncope could have been due to VT or due to brady. Plan for dual chamber ICD today.  No driving x 6 months.   [name] 02/25/2022 9:05 AM

## 2022-02-25 NOTE — Progress Notes (Signed)
Central tele called and told RN that pt had 11-beat run VT. Pt asymptomatic and states he feels "fine." RN paged PA and awaiting a call. RN continuing to monitor pt.   PA said to obtain EKG, no amio at this time and that pm shift should page MD if it happens again. RN continuing to monitor.

## 2022-02-25 NOTE — Assessment & Plan Note (Signed)
-   Supplement Mag 

## 2022-02-25 NOTE — Progress Notes (Signed)
Physical Therapy Treatment Patient Details Name: Eric Gordon MRN: [id number] DOB: October 20, 1930 Today's Date: 02/25/2022   History of Present Illness Eric Gordon is a 88 y.o. male presented to [facility] ED from home after a syncopal episode while working on his deck. Plan for pacemaker 02/25/22. PHMx:  paroxysmal A-fib, combined diastolic and systolic CHF with LVEF 73-42% grade 1 diastolic dysfunction, AAA status postrepair, bilateral carotid stenosis status post stenting, hypertension, hyperlipidemia, hypothyroidism, BPH, recently admitted in April 2023 where he was found to have a large left ventricular pseudoaneurysm. Underwent EP study on 02/24/22 showing inducible VT.    PT Comments    Pt is progressing well towards PT goals. During today's session, pt was able to improve to min A with bed mobility requiring continued cues for log roll technique due to back pain. Pt continued to be min A for transfers and ambulation for safety. Pt was preemptively educated on post-op mobility restrictions with PPM placement, and pt tolerated practicing bed mobility without use of LUE well. Pt tolerated 125 ft of gait progression with RW and no rest breaks due to back pain, HR ranging from 59-82 bpm with an irregular rhythm. Will continue to follow acutely.   Recommendations for follow up therapy are one component of a multi-disciplinary discharge planning process, led by the attending physician.  Recommendations may be updated based on patient status, additional functional criteria and insurance authorization.  Follow Up Recommendations  Home health PT     Assistance Recommended at Discharge Intermittent Supervision/Assistance  Patient can return home with the following A little help with walking and/or transfers;A little help with bathing/dressing/bathroom;Assistance with cooking/housework;Help with stairs or ramp for entrance;Assist for transportation   Equipment Recommendations  None recommended by PT     Recommendations for Other Services       Precautions / Restrictions Precautions Precautions: Fall Precaution Comments: watch HR/02 Restrictions Weight Bearing Restrictions: No     Mobility  Bed Mobility Overal bed mobility: Needs Assistance Bed Mobility: Rolling, Sidelying to Sit Rolling: Min assist Sidelying to sit: Min assist, HOB elevated       General bed mobility comments: required cues for log rolling, reaching for rail, and trunk elevation. min A with LEs and trunk. pt tolerated practicing not using left arm for PPM placement during bed mobility well.    Transfers Overall transfer level: Needs assistance Equipment used: Rolling walker (2 wheels) Transfers: Sit to/from Stand Sit to Stand: Min assist, From elevated surface           General transfer comment: Min A to stand with cues for hand placement/technique. Stood from [employer].    Ambulation/Gait Ambulation/Gait assistance: Min assist Gait Distance (Feet): 125 Feet Assistive device: Rolling walker (2 wheels) Gait Pattern/deviations: Step-through pattern, Decreased stride length, Trunk flexed Gait velocity: decreased Gait velocity interpretation: <1.31 ft/sec, indicative of household ambulator   General Gait Details: pt with continued decrease in gait velocity and stride length. more fluid gait pattern with less guarding compared to yesterday with no rest breaks due to pain. HR ranging from 59-82 bpm irregular.   Stairs             Wheelchair Mobility    Modified Rankin (Stroke Patients Only)       Balance Overall balance assessment: Needs assistance Sitting-balance support: Feet supported Sitting balance-Leahy Scale: Fair Sitting balance - Comments: supervision for safety   Standing balance support: Reliant on assistive device for balance Standing balance-Leahy Scale: Poor Standing balance comment: min  A for safety                            Cognition Arousal/Alertness:  Awake/alert Behavior During Therapy: WFL for tasks assessed/performed Overall Cognitive Status: Within Functional Limits for tasks assessed                                          Exercises      General Comments        Pertinent Vitals/Pain Pain Assessment Pain Assessment: 0-10 Pain Score: 8  Pain Location: back, head injury, overall sore Pain Descriptors / Indicators: Discomfort, Grimacing, Sore, Aching Pain Intervention(s): Monitored during session, Repositioned, Premedicated before session, Limited activity within patient's tolerance    Home Living                          Prior Function            PT Goals (current goals can now be found in the care plan section) Acute Rehab PT Goals Patient Stated Goal: return home PT Goal Formulation: With patient Time For Goal Achievement: 03/09/22 Potential to Achieve Goals: Good Progress towards PT goals: Progressing toward goals    Frequency    Min 3X/week      PT Plan Current plan remains appropriate    Co-evaluation              AM-PAC PT "6 Clicks" Mobility   Outcome Measure  Help needed turning from your back to your side while in a flat bed without using bedrails?: A Little Help needed moving from lying on your back to sitting on the side of a flat bed without using bedrails?: A Lot Help needed moving to and from a bed to a chair (including a wheelchair)?: A Lot Help needed standing up from a chair using your arms (e.g., wheelchair or bedside chair)?: A Little Help needed to walk in hospital room?: A Little Help needed climbing 3-5 steps with a railing? : A Lot 6 Click Score: 15    End of Session Equipment Utilized During Treatment: Gait belt Activity Tolerance: Patient tolerated treatment well;No increased pain;Patient limited by pain Patient left: with call bell/phone within reach;with family/visitor present;in chair Nurse Communication: Mobility status PT Visit  Diagnosis: Unsteadiness on feet (R26.81);Muscle weakness (generalized) (M62.81);Repeated falls (R29.6);Difficulty in walking, not elsewhere classified (R26.2);Pain Pain - part of body:  (back)     Time: 1020-1045 PT Time Calculation (min) (ACUTE ONLY): 25 min  Charges:  $Gait Training: 8-22 mins $Therapeutic Activity: 8-22 mins                     Havery Moros, MS, [place] Acute Rehabilitation Services Office: [place] 02/25/2022, 1:32 PM

## 2022-02-25 NOTE — Assessment & Plan Note (Signed)
Sustained a fall due to syncope.  In a lot of pain before, CT radiography ruled out rib fracture, humerus fracture, shoulder fracture, PTX.  Feeling better.  Note prior hx PTX. - PT, hot pad, gentle analgesics

## 2022-02-25 NOTE — Progress Notes (Signed)
Patient refused CPAP for tonight 

## 2022-02-25 NOTE — Assessment & Plan Note (Signed)
Patient admitted and cardiology consulted.  EP study on 6/5 showed inducible VT and also post-tachycardia pauses, either which could have caused syncope.  - EP plan for device placement today - Hold Coreg

## 2022-02-25 NOTE — Progress Notes (Signed)
Progress Note   Patient: Eric Gordon XAJ:[id number] DOB: 1931-08-12 DOA: 02/22/2022     2 DOS: the patient was seen and examined on 02/25/2022 at 10:54AM      Brief hospital course: Mr. Pultz is an 88 y.o. M with sCHF, CAD s/p CABG remote, hx LV pseudoaneurysm, hx previous spontaneous pneumothorax, HTN, mild AS, pAF on Eliquis, hypothyroidism and PVD (carotid and AAA) who presented with several episodes syncope.   6/3: Admitted on tele 6/4: Cardiology consulted, noted recent monitoring with 3s pause and bifascicular block, recommended EP evaluation 6/5: Started on Lasix by HF team, underwent EP study showing inducible VT 6/6: ICD placement today     Assessment and Plan: Syncope Patient admitted and cardiology consulted.  EP study on 6/5 showed inducible VT and also post-tachycardia pauses, either which could have caused syncope.  - EP plan for device placement today - Hold Coreg  Acute on chronic combined systolic and diastolic CHF (congestive heart failure) (HCC) Now euvolemic - Hold further Lasix - Continue home spironolactone - Resume valsartan at d/c - Stop carvedilol   Laceration of head 5 staples to the posterior scalp placed in the ER.   - Need to be removed in 7-10 days as an outpt.  Chest wall contusion Sustained a fall due to syncope.  In a lot of pain before, CT radiography ruled out rib fracture, humerus fracture, shoulder fracture, PTX.  Feeling better.  Note prior hx PTX. - PT, hot pad, gentle analgesics  Hypomagnesemia - Supplement Mag  PVD (peripheral vascular disease) carotid - Continue statin   Hyperlipidemia - Continue atorvastatin  AKI (acute kidney injury) ([place]) Baseline 0.9-1.1, here was 1.7 up to 1.9 on admission.  Now resolved.  Pseudoaneurysm of left ventricle of heart     Hypothyroidism - Continue levothyroxine  Mild aortic stenosis     Paroxysmal atrial fibrillation (HCC) - Hold Eliquis for PPM, resume when  appropriate after, defer to EP - Consult Cardiology - Hold Coreg  CAD s/p CABG 3/12  - Hold Imdur, valsartan given soft blood pressure - Continue atorvastatin   Abdominal aortic aneurysm (HCC) - Continue atorvastatin  Essential hypertension Blood pressure normal - Hold Imdur, valsartan, Coreg - Continue spironolactone          Subjective: Patient feels a lot better.  His chest wall pain is mostly resolved.  He is sitting up, off oxygen, feels comfortable.  Appetite and energy are improving.     Physical Exam: Vitals:   02/24/22 2321 02/25/22 0316 02/25/22 0500 02/25/22 0828  BP: (!) 118/55 (!) 152/63  118/65  Pulse: 62 60  62  Resp: '19 20  15  '$ Temp: 97.6 F (36.4 C) (!) 97.5 F (36.4 C)  97.9 F (36.6 C)  TempSrc: Oral Oral  Oral  SpO2: 96% 97%  94%  Weight:   94.3 kg   Height:       Elderly adult male, sitting in recliner, interactive, no acute distress RRR, soft systolic murmur, minimal trace pretibial edema Respiratory rate normal, lungs clear without rales or wheezes Attention normal, affect normal, judgment insight appear normal  Data Reviewed: Cardiology and electrophysiology notes reviewed, nursing notes reviewed Basic metabolic panel shows normal electrolytes, low magnesium  Family Communication: Wife at the bedside    Disposition: Status is: Inpatient The patient was admitted with syncope  Cardiology consult note was found here that he was having arrhythmias that led to his syncope.  Electrophysiology recommend device placement which should happen today.  Likely home tomorrow        Author: Edwin Dada, MD 02/25/2022 12:13 PM  For on call review [email or web address].

## 2022-02-25 NOTE — Progress Notes (Addendum)
Progress Note  Patient Name: Eric Gordon Date of Encounter: 02/25/2022  [facility] Cardiologist: Dr. Aundra Dubin  Subjective   Feels better, less sore.  No CP outside of chest wall, no SOB  Inpatient Medications    Scheduled Meds:  atorvastatin  80 mg Oral Daily   furosemide  20 mg Intravenous Once   levothyroxine  75 mcg Oral QAC breakfast   pantoprazole  40 mg Oral Daily   sodium chloride flush  3 mL Intravenous Q12H   spironolactone  25 mg Oral Daily   tamsulosin  0.4 mg Oral QPC supper   Continuous Infusions:  sodium chloride     magnesium sulfate bolus IVPB     PRN Meds: sodium chloride, acetaminophen, melatonin, ondansetron (ZOFRAN) IV, polyethylene glycol, prochlorperazine, sodium chloride flush, traMADol   Vital Signs    Vitals:   02/24/22 2321 02/25/22 0316 02/25/22 0500 02/25/22 0828  BP: (!) 118/55 (!) 152/63  118/65  Pulse: 62 60  62  Resp: '19 20  15  '$ Temp: 97.6 F (36.4 C) (!) 97.5 F (36.4 C)  97.9 F (36.6 C)  TempSrc: Oral Oral  Oral  SpO2: 96% 97%  94%  Weight:   94.3 kg   Height:        Intake/Output Summary (Last 24 hours) at 02/25/2022 0909 Last data filed at 02/25/2022 0800 Gross per 24 hour  Intake 554.07 ml  Output 800 ml  Net -245.93 ml      02/25/2022    5:00 AM 02/24/2022    4:43 AM 02/23/2022    4:26 AM  Last 3 Weights  Weight (lbs) 207 lb 14.3 oz 205 lb 7.5 oz 205 lb 14.6 oz  Weight (kg) 94.3 kg 93.2 kg 93.4 kg      Telemetry    SB 50's, infrequent ad very brief PAF/PATs - Personally Reviewed  ECG    No  new EKGs - Personally Reviewed  Physical Exam   GEN: No acute distress.   Posterior head wound is clean/dry, staples in place Neck: No JVD Cardiac: RRR, 2/6SM, rubs, or gallops.  Respiratory: CTA b/l. GI: Soft, nontender, non-distended  MS: No edema; No deformity. Neuro:  Nonfocal  Psych: Normal affect   Labs    High Sensitivity Troponin:   Recent Labs  Lab 02/22/22 1649  TROPONINIHS 14      Chemistry Recent Labs  Lab 02/22/22 1649 02/22/22 1656 02/23/22 0111 02/23/22 1920 02/24/22 0613 02/25/22 0154  NA 142   < > 142  --  138 136  K 4.2   < > 4.0 4.3 4.4 4.2  CL 107   < > 107  --  104 105  CO2 27  --  28  --  28 24  GLUCOSE 98   < > 113*  --  110* 123*  BUN 31*   < > 29*  --  24* 22  CREATININE 1.68*   < > 1.43*  --  1.22 1.12  CALCIUM 9.8  --  9.3  --  9.2 8.9  MG  --   --  1.9 1.8  --  1.6*  PROT 6.6  --  6.8  --   --   --   ALBUMIN 3.7  --  3.8  --   --   --   AST 25  --  25  --   --   --   ALT 14  --  15  --   --   --  ALKPHOS 71  --  71  --   --   --   BILITOT 0.6  --  0.6  --   --   --   GFRNONAA 39*  --  47*  --  57* >60  ANIONGAP 8  --  7  --  6 7   < > = values in this interval not displayed.    Lipids  Recent Labs  Lab 02/18/22 0920  CHOL 113  TRIG 62  HDL 39*  LDLCALC 62  CHOLHDL 2.9    Hematology Recent Labs  Lab 02/22/22 1649 02/22/22 1656 02/23/22 0111  WBC 9.7  --  9.7  RBC 4.02*  --  4.13*  HGB 12.8* 13.3 13.0  HCT 40.5 39.0 41.1  MCV 100.7*  --  99.5  MCH 31.8  --  31.5  MCHC 31.6  --  31.6  RDW 14.4  --  14.6  PLT 145*  --  143*   Thyroid  Recent Labs  Lab 02/23/22 1225  TSH 2.442    BNPNo results for input(s): BNP, PROBNP in the last 168 hours.  DDimer No results for input(s): DDIMER in the last 168 hours.   Radiology     Cardiac Studies   May 2023 Conclusion:  1. Predominantly NSR.  2. 2% atrial fibrillation burden.  3. 4 NSVT runs, longest lasting 5 beats.  4. Rare PVCs  02/23/22: TTE  1. There is a small aneurysm of the basal inferolateral wall. Thrombus is  not seen. Left ventricular ejection fraction, by estimation, is 40 to 45%.  The left ventricle has mildly decreased function. The left ventricle  demonstrates regional wall motion  abnormalities (see scoring diagram/findings for description). There is  mild concentric left ventricular hypertrophy. Left ventricular diastolic  function could not  be evaluated.   2. Right ventricular systolic function is normal. The right ventricular  size is normal. There is normal pulmonary artery systolic pressure. The  estimated right ventricular systolic pressure is 25.0 mmHg.   3. 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 A small  partly mobile calcified component may be present. The mitral valve is  degenerative. No evidence of mitral stenosis. Moderate to severe mitral  annular calcification.   4. Tricuspid valve regurgitation is mild to moderate.   5. The aortic valve is tricuspid. There is moderate calcification of the  aortic valve. There is moderate thickening of the aortic valve. Aortic  valve regurgitation is not visualized. Mild to moderate aortic valve  stenosis.   6. There is borderline dilatation of the ascending aorta, measuring 39  mm.   7. The inferior vena cava is normal in size with greater than 50%  respiratory variability, suggesting right atrial pressure of 3 mmHg.    11/21/2021: stress myoview   Findings are consistent with prior myocardial infarction. The study is high risk.   There were ST depressions in the anterior leads and frequent brief runs of NSVT as well as isolated PVCs during stress.   LV perfusion is abnormal. There is a medium sized, severe fixed defect in the basal-to-apical inferior and inferolateral LV walls. There is a small fixed defect in the basal-to-mid anterolateral LV wall. There is abnormal wall motion in the defect area. Consistent with infarction.   Left ventricular function is abnormal. Nuclear stress EF: 37 %. The left ventricular ejection fraction is moderately decreased (30-44%). End diastolic cavity size is moderately enlarged.   Prior study available for comparison from  08/28/2020. Compared to prior study, the inferior and inferolateral defect is now seen in the apical segments as well as the basal-to-mid segments. There is also a basal-to-mid anterolateral fixed  defect.  Patient Profile     88 y.o. male CAD s/p CABG, DOE, spontaneous PTX, b/l CAS s/p R/L TCAR (2021/2022), AAA, OSA, prior PE, AF, LV aneurysm, RBBB,  and tremor.  Admitted with syncope  Assessment & Plan    Syncope 2. Conduction system disease with baseline RBBB/LAD (intermittent 1st degree AVBlock)     Post termination pauses (approx 3-3.4 seconds) 3. NSVT seen on stress test and monitor, inducible VT by EPS yesterday 4. LV aneurysm 5. CM (presumed ischemic)      LVEF 40's w/RBBB  Planned for dual chamber ICD today Pt despite his age remains very active, goes to the [facility] regularly. Discussed rational for ICD vs pacer he is very much in agreement with an ICD We reviewed implant procedure, potential risks/benefits again today he remains willing to proceed  No driving 82mo patient/wife are aware   6. Paroxysmal Afib CHA2DS2Vasc is 5, on Eliquis, held here for procedures Will plan to resume pending pocket stability post procedure  Historically off amiodarone with concerns of pulmonary toxicity  For questions or updates, please contact [place]Please consult [email or web address] for contact info under        Signed, RBaldwin Jamaica PA-C  02/25/2022, 9:09 AM

## 2022-02-25 NOTE — Assessment & Plan Note (Addendum)
5 staples to the posterior scalp placed in the ER.   - Need to be removed in 7-10 days as an outpt.

## 2022-02-26 ENCOUNTER — Inpatient Hospital Stay (HOSPITAL_COMMUNITY): Payer: Medicare Other

## 2022-02-26 DIAGNOSIS — L538 Other specified erythematous conditions: Secondary | ICD-10-CM

## 2022-02-26 DIAGNOSIS — M7989 Other specified soft tissue disorders: Secondary | ICD-10-CM

## 2022-02-26 DIAGNOSIS — I5043 Acute on chronic combined systolic (congestive) and diastolic (congestive) heart failure: Secondary | ICD-10-CM | POA: Diagnosis not present

## 2022-02-26 LAB — BASIC METABOLIC PANEL
Anion gap: 6 (ref 5–15)
BUN: 17 mg/dL (ref 8–23)
CO2: 26 mmol/L (ref 22–32)
Calcium: 9.1 mg/dL (ref 8.9–10.3)
Chloride: 103 mmol/L (ref 98–111)
Creatinine, Ser: 0.95 mg/dL (ref 0.61–1.24)
GFR, Estimated: >60 mL/min (ref >60)
Glucose, Bld: 120 mg/dL — ABNORMAL HIGH (ref 70–99)
Potassium: 4.1 mmol/L (ref 3.5–5.1)
Sodium: 135 mmol/L (ref 135–145)

## 2022-02-26 LAB — CBC
HCT: 40.4 % (ref 39.0–52.0)
Hemoglobin: 12.8 g/dL — ABNORMAL LOW (ref 13.0–17.0)
MCH: 31.2 pg (ref 26.0–34.0)
MCHC: 31.7 g/dL (ref 30.0–36.0)
MCV: 98.5 fL (ref 80.0–100.0)
Platelets: 129 10*3/uL — ABNORMAL LOW (ref 150–400)
RBC: 4.1 MIL/uL — ABNORMAL LOW (ref 4.22–5.81)
RDW: 13.7 % (ref 11.5–15.5)
WBC: 7.4 10*3/uL (ref 4.0–10.5)
nRBC: 0 % (ref 0.0–0.2)

## 2022-02-26 LAB — MAGNESIUM: Magnesium: 2.2 mg/dL (ref 1.7–2.4)

## 2022-02-26 MED ORDER — APIXABAN 5 MG PO TABS: 5.0000 mg | ORAL_TABLET | Freq: Two times a day (BID) | ORAL | Status: DC

## 2022-02-26 NOTE — Progress Notes (Signed)
Occupational Therapy Treatment Patient Details Name: Eric Gordon MRN: [id number] DOB: 1930/09/30 Today's Date: 02/26/2022   History of present illness Eric Gordon is a 88 y.o. male presented to [facility] ED from home after a syncopal episode while working on his deck. Plan for pacemaker 02/25/22. PHMx:  paroxysmal A-fib, combined diastolic and systolic CHF with LVEF 60-10% grade 1 diastolic dysfunction, AAA status postrepair, bilateral carotid stenosis status post stenting, hypertension, hyperlipidemia, hypothyroidism, BPH, recently admitted in April 2023 where he was found to have a large left ventricular pseudoaneurysm. Underwent EP study on 02/24/22 showing inducible VT.   OT comments  Patient required verbal cues and min assist to get to EOB.  Patient's LUE sling was readjusted for more support. Patient ambulated to sink with RW using RUE and performed grooming standing at sink. Patient asked to use straight cane to walk to recliner and was min assist to perform. Patient's wife discussed concerns over returning home. Discussed getting bed rail to assist with bed mobility, allowing patient to perform self care tasks as independent as possible while adhering to LUE precautions, and continued therapy with HHOT.     Recommendations for follow up therapy are one component of a multi-disciplinary discharge planning process, led by the attending physician.  Recommendations may be updated based on patient status, additional functional criteria and insurance authorization.    Follow Up Recommendations  Home health OT    Assistance Recommended at Discharge Frequent or constant Supervision/Assistance  Patient can return home with the following  A little help with walking and/or transfers;A little help with bathing/dressing/bathroom;Help with stairs or ramp for entrance;Assistance with cooking/housework;Assist for transportation   Equipment Recommendations  None recommended by OT     Recommendations for Other Services      Precautions / Restrictions Precautions Precautions: Fall Precaution Comments: watch HR/02 Restrictions Weight Bearing Restrictions: Yes LUE Weight Bearing: Non weight bearing       Mobility Bed Mobility Overal bed mobility: Needs Assistance Bed Mobility: Rolling, Sidelying to Sit Rolling: Min assist Sidelying to sit: Min assist, HOB elevated       General bed mobility comments: min assist due to uanble to use LUE    Transfers Overall transfer level: Needs assistance Equipment used: Rolling walker (2 wheels), Straight cane Transfers: Sit to/from Stand, Bed to chair/wheelchair/BSC Sit to Stand: Min assist, From elevated surface           General transfer comment: min assist to stand from EOB and ambulate to sink for grooming with RW. Used straight cane to ambulate and transfer to recliner     Balance Overall balance assessment: Needs assistance Sitting-balance support: Feet supported Sitting balance-Leahy Scale: Fair Sitting balance - Comments: supervision for safety   Standing balance support: Reliant on assistive device for balance Standing balance-Leahy Scale: Poor Standing balance comment: min A for safety                           ADL either performed or assessed with clinical judgement   ADL Overall ADL's : Needs assistance/impaired     Grooming: Wash/dry hands;Oral care;Wash/dry face;Minimal assistance;Standing Grooming Details (indicate cue type and reason): mi assist with toothpaste         Upper Body Dressing : Minimal assistance;Sitting Upper Body Dressing Details (indicate cue type and reason): to donn gown                   General ADL Comments: patient  askes for help before attempting on his own, requires cues for encouragement    Extremity/Trunk Assessment              Vision       Perception     Praxis      Cognition Arousal/Alertness: Awake/alert Behavior During  Therapy: WFL for tasks assessed/performed Overall Cognitive Status: Within Functional Limits for tasks assessed                                 General Comments: aware of deficits        Exercises      Shoulder Instructions       General Comments      Pertinent Vitals/ Pain       Pain Assessment Pain Assessment: Faces Faces Pain Scale: Hurts a little bit Pain Location: back Pain Descriptors / Indicators: Discomfort, Grimacing Pain Intervention(s): Premedicated before session, Monitored during session, Repositioned  Home Living                                          Prior Functioning/Environment              Frequency  Min 2X/week        Progress Toward Goals  OT Goals(current goals can now be found in the care plan section)  Progress towards OT goals: Progressing toward goals  Acute Rehab OT Goals Patient Stated Goal: go home OT Goal Formulation: With patient Time For Goal Achievement: 03/09/22 Potential to Achieve Goals: Good ADL Goals Pt Will Perform Grooming: with modified independence;standing Pt Will Perform Upper Body Bathing: with modified independence;sitting Pt Will Perform Lower Body Bathing: with modified independence;sit to/from stand Pt Will Perform Upper Body Dressing: Independently;sitting Pt Will Perform Lower Body Dressing: with modified independence;sit to/from stand Pt Will Transfer to Toilet: with modified independence;ambulating;grab bars Pt Will Perform Toileting - Clothing Manipulation and hygiene: with modified independence;sit to/from stand Additional ADL Goal #1: Pt will be Mod I in and OOB for basic ADLs (HOB flat, no rail, increased time)  Plan Discharge plan remains appropriate    Co-evaluation                 AM-PAC OT "6 Clicks" Daily Activity     Outcome Measure   Help from another person eating meals?: None Help from another person taking care of personal grooming?: A  Little Help from another person toileting, which includes using toliet, bedpan, or urinal?: A Little Help from another person bathing (including washing, rinsing, drying)?: A Little Help from another person to put on and taking off regular upper body clothing?: A Little Help from another person to put on and taking off regular lower body clothing?: A Lot 6 Click Score: 18    End of Session Equipment Utilized During Treatment: Gait belt;Rolling walker (2 wheels)  OT Visit Diagnosis: Unsteadiness on feet (R26.81);Other abnormalities of gait and mobility (R26.89);Muscle weakness (generalized) (M62.81);Pain   Activity Tolerance Patient tolerated treatment well   Patient Left in chair;with call bell/phone within reach;with family/visitor present   Nurse Communication Mobility status        Time: 4098-1191 OT Time Calculation (min): 25 min  Charges: OT General Charges $OT Visit: 1 Visit OT Treatments $Self Care/Home Management : 23-37 mins  Lodema Hong, OTA Acute Rehabilitation Services  Pager [phone] Office [phone]   Trixie Dredge 02/26/2022, 9:40 AM

## 2022-02-26 NOTE — Progress Notes (Addendum)
Advanced Heart Failure Rounding Note  PCP-Cardiologist: None   Subjective:    EP study w/ inducible VT also with bifascicular block with post-termination pauses when goes into AF.  Syncope possibly due to VT or due to bradycardia.  S/p dual chamber Biotronik ICD 06/06  Feels okay today. Has been up walking in room. Has already seen OT.   Has new RUE swelling. IV removed. Has [place] ordered to r/o DVT.  Objective:   Weight Range: 94.7 kg Body mass index is 25.75 kg/m.   Vital Signs:   Temp:  [97.7 F (36.5 C)-98.8 F (37.1 C)] 97.7 F (36.5 C) (06/07 0730) Pulse Rate:  [61-90] 71 (06/07 0730) Resp:  [16-30] 23 (06/07 0730) BP: (114-176)/(62-97) 141/95 (06/07 0730) SpO2:  [75 %-99 %] 95 % (06/07 0730) Weight:  [94.7 kg] 94.7 kg (06/07 0247) Last BM Date : 02/22/22  Weight change: Filed Weights   02/24/22 0443 02/25/22 0500 02/26/22 0247  Weight: 93.2 kg 94.3 kg 94.7 kg    Intake/Output:   Intake/Output Summary (Last 24 hours) at 02/26/2022 0830 Last data filed at 02/26/2022 0738 Gross per 24 hour  Intake 351.08 ml  Output 1000 ml  Net -648.92 ml      Physical Exam    General:  No distress. Sitting up in chair.  HEENT: normal. Dressing noted on forehead Neck: supple. no JVD. Carotids 2+ bilat; no bruits.  Cor: PMI nondisplaced. Regular rate & rhythm. No rubs, gallops, 2/6 SEM. ICD site stable. Lungs: clear Abdomen: soft, nontender, nondistended.  Extremities: no cyanosis, clubbing, rash, swelling RUE, edema Neuro: alert & orientedx3, cranial nerves grossly intact. moves all 4 extremities w/o difficulty. Affect pleasant   Telemetry   SR with PACs and PVCs, intermittently atrial paced. Several brief runs NSVT  EKG    N/A   Labs    CBC Recent Labs    02/26/22 0047  WBC 7.4  HGB 12.8*  HCT 40.4  MCV 98.5  PLT 937*   Basic Metabolic Panel Recent Labs    02/25/22 0154 02/26/22 0047  NA 136 135  K 4.2 4.1  CL 105 103  CO2 24 26  GLUCOSE  123* 120*  BUN 22 17  CREATININE 1.12 0.95  CALCIUM 8.9 9.1  MG 1.6* 2.2   Liver Function Tests No results for input(s): AST, ALT, ALKPHOS, BILITOT, PROT, ALBUMIN in the last 72 hours.  No results for input(s): LIPASE, AMYLASE in the last 72 hours. Cardiac Enzymes No results for input(s): CKTOTAL, CKMB, CKMBINDEX, TROPONINI in the last 72 hours.  BNP: BNP (last 3 results) Recent Labs    11/15/21 1301 12/31/21 0902 01/16/22 1124  BNP 331.5* 341.1* 253.2*    ProBNP (last 3 results) No results for input(s): PROBNP in the last 8760 hours.   D-Dimer No results for input(s): DDIMER in the last 72 hours. Hemoglobin A1C No results for input(s): HGBA1C in the last 72 hours. Fasting Lipid Panel No results for input(s): CHOL, HDL, LDLCALC, TRIG, CHOLHDL, LDLDIRECT in the last 72 hours. Thyroid Function Tests Recent Labs    02/23/22 1225  TSH 2.442    Other results:   Imaging    DG Chest 2 View  Result Date: 02/26/2022 CLINICAL DATA:  Encounter for ICD placement EXAM: CHEST - 2 VIEW COMPARISON:  February 22, 2022 FINDINGS: The heart size and mediastinal contours are within normal limits. Both lungs are clear. The visualized skeletal structures are unremarkable. IMPRESSION: No active cardiopulmonary disease. Electronically Signed  By: Abelardo Diesel M.D.   On: 02/26/2022 07:15   EP PPM/ICD IMPLANT  Result Date: 02/25/2022  CONCLUSIONS:  1. Ischemic cardiomyopathy, syncope and inducible sustained monomorphic ventricular tachycardia  2. Successful ICD implantation with a Biotronik DDD ICD implanted for secondary prevention of sudden death.  3.  No early apparent complications.     Medications:     Scheduled Medications:  atorvastatin  80 mg Oral Daily   furosemide  20 mg Intravenous Once   levothyroxine  75 mcg Oral QAC breakfast   pantoprazole  40 mg Oral Daily   sodium chloride flush  3 mL Intravenous Q12H   spironolactone  25 mg Oral Daily   tamsulosin  0.4 mg Oral QPC  supper    Infusions:  sodium chloride      PRN Medications: sodium chloride, acetaminophen, melatonin, ondansetron (ZOFRAN) IV, polyethylene glycol, prochlorperazine, sodium chloride flush, traMADol    Patient Profile   Eric Gordon is a 88 y.o. male with CAD s/p CABG, DOE, spontaneous PTX, b/l CAS s/p R/L TCAR (2021/2022), AAA, OSA, prior PE, AF and tremor, admitted w/ syncope. Suspected to be cardiogenic. Known to have tachybradycardia syndrome with recent outpatient monitor showing postconversion symptomatic pauses. EP study this admit also showed inducible VT.   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   Assessment/Plan   1. Syncope: most likely cardiogenic. Known tachybradycardia syndrome with recent outpatient monitor showing postconversion symptomatic pauses, up to 3 sec long. Has bifascicular block.  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 EP study this admit w/ inducible VT - S/p Biotronik ICD 06/06 2. CAD: s/p CABG 3/12. NSTEMI 1/20, LHC showed new occlusion of the branch of SVG-ramus and OM that touched down on OM.  There were also serial 70% stenoses in the mid/distal RCA.  FFR of RCA was negative.  No interventional target.  Cardiolite in 12/21 showed inferolateral infarction with no ischemia, consistent with findings on NSTEMI in 1/20.  Cardiolite in 3/23 showed inferior/inferolateral fixed defect with no ischemia.  No recent ischemic CP  - No ASA given Eliquis use.  - Continue atorvastatin 80 daily.   - Off ranolazine with prolonged QTc.  3. Hyperlipidemia: On atorvastatin 80 daily 4. Chronic HF with mid range EF: TEE in 7/20 showed EF 50-55%, basal inferolateral aneurysm, and normal RV.  Echo in 1/22 with EF 40-45%, basal-mid inferolateral akinesis and inferior hypokinesis, normal RV.  Echo was stable in 4/23 with EF 40-45%, akinesis of the inferolateral wall, basal  inferolateral pseudoaneurysm, moderate RV enlargement, normal RV systolic function, mild AS with mean gradient 10 mmHg.  NYHA class II, I think that his dyspnea is primarily related to his left lung disease (mild and stable).  He was unable to tolerate dapagliflozin and became lightheaded/hypotensive with Entresto.  - Coreg had to be stopped due to orthostasis.  - Restart home valsartan20 BID at discharge, not available in hospital - Continue spironolactone 25 mg daily.   - Volume stable on exam today. No further IV Lasix  5. Spontaneous left PTX with recurrent left-sided pleural effusion:  He is s/p left VATS/pleurodesis in 7/20.    6. Carotid stenosis: S/p bilateral TCARs, followed at [facility].     7. AAA: 4.5 cm AAA in 9/22, plan repeat AAA [place] as outpatient.      8. Aortic stenosis: mild-mod on echo this admit  9. Atrial fibrillation: Paroxysmal. He is significantly symptomatic  while in atrial fibrillation.  He is in NSR today.  He is off amiodarone due to concern for pulmonary toxicity.  He saw EP, did not recommend ablation. He would likely be a Tikosyn or sotalol candidate but would have to come into the hospital for it.  As he has had minimal atrial fibrillation recently, hold off Tikosyn/sotalol for now. NSR today.  - Eliquis held d/t ICD implant. Resume when okay with EP. 10. Pseudoaneurysm: Chronic PSA basal inferolateral wall.  This has been stable, not candidate for surgery.  11. VT: EP study this admit w/ inducible VT - Dual chamber ICD 06/06 - No driving x 6 months.  12. RUE swelling: May be phlebitis - IV removed - Awaiting [place]  Potentially stable for discharge home later today once completes US for RUE swelling.  Meds: -Apixaban 5 BID (resume when okay with EP) -Lipitor 80 mg daily -Lasix 20 mg PRN -Arlyce Harman 25 daily -Valsartan 20 mg BID -Imdur 30 daily    Length of Stay: 3  FINCH, LINDSAY N, PA-C  02/26/2022, 8:30 AM  Advanced Heart Failure Team Pager [phone] (M-F; 7a - 5p)   Please contact [facility] for night-coverage after hours (5p -7a ) and weekends on [email or web address]  Patient seen with PA, agree with the above note.   Biotronik dual chamber ICD placed yesterday, a-paced this morning.   Has redness/swelling right forearm to elbow, IV removed.   General: NAD Neck: No JVD, no thyromegaly or thyroid nodule.  Lungs: Clear to auscultation bilaterally with normal respiratory effort. CV: Nondisplaced PMI.  Heart regular S1/S2, no S3/S4, no murmur.  No peripheral edema.   Abdomen: Soft, nontender, no hepatosplenomegaly, no distention.  Skin: Intact without lesions or rashes.  Neurologic: Alert and oriented x 3.  Psych: Normal affect. Extremities: No clubbing or cyanosis,  redness/swelling right forearm to elbow HEENT: Normal.   Suspect phlebitis right forearm, doppler US ordered to rule out DVT.    Resume Eliquis when ok with EP.   Rhythm stable, has Biotronik DDD ICD.  I think that he is ready for discharge on the above meds as long as vascular US looks ok.    [name] 02/26/2022 9:04 AM

## 2022-02-26 NOTE — Care Management Important Message (Signed)
Important Message  Patient Details  Name: Eric Gordon MRN: [id number] Date of Birth: 1931/05/07   Medicare Important Message Given:  Yes     Oaklynn Stierwalt 02/26/2022, 3:05 PM

## 2022-02-26 NOTE — Discharge Summary (Signed)
Physician Discharge Summary  Eric Gordon JIR:[id number] DOB: Jul 11, 1931 DOA: 02/22/2022  PCP: Shon Baton, MD  Admit date: 02/22/2022 Discharge date: 02/26/2022  Time spent: 61mnutes  Recommendations for Outpatient Follow-up:  EP in 1 to 2 weeks PCP in 7 to 10 days, needs scalp staples removed Heart failure team in 2 to 3 weeks Restart Eliquis on 6/12  Discharge Diagnoses:  Principal Problem:   Tachy-brady syndrome ([place] Active Problems:   Syncope   Acute on chronic combined systolic and diastolic CHF (congestive heart failure) (HCC)   Essential hypertension   Abdominal aortic aneurysm (HCC)   CAD s/p CABG 3/12    Paroxysmal atrial fibrillation (HCC)   Mild aortic stenosis   Hypothyroidism   Pseudoaneurysm of left ventricle of heart   AKI (acute kidney injury) ([facility]   Hyperlipidemia   PVD (peripheral vascular disease) carotid   Hypomagnesemia   Chest wall contusion   Laceration of head   Discharge Condition: Stable  Diet recommendation: Low-sodium, heart healthy  Filed Weights   02/24/22 0443 02/25/22 0500 02/26/22 0247  Weight: 93.2 kg 94.3 kg 94.7 kg    History of present illness:  Mr. WBaumleris an 88y.o. M with sCHF, CAD s/p CABG remote, hx LV pseudoaneurysm, hx previous spontaneous pneumothorax, HTN, mild AS, pAF on Eliquis, hypothyroidism and PVD (carotid and AAA) who presented with several episodes syncope.    Hospital Course:   Syncope Patient admitted and cardiology consulted.  EP study on 6/5 showed inducible VT and also post-tachycardia pauses, either which could have caused syncope. -Seen by EP in consultation, underwent Biotronik ICD placement on 6/6 -Cleared for discharge home today by EP and heart failure team, follow-up in 1 week -Recommended to restart Eliquis on 6/12   Acute on chronic combined systolic and diastolic CHF (congestive heart failure) ([place] Diuresed with IV Lasix, now euvolemic -Aldactone valsartan Imdur resumed -Will  go back to taking Lasix 20 Mg as needed -Follow-up in the heart failure clinic, Coreg discontinued   Laceration of head 5 staples to the posterior scalp placed in the ER.   - Need to be removed in 7-10 days as an outpt.   Chest wall contusion Sustained a fall due to syncope.  In a lot of pain before, CT radiography ruled out rib fracture, humerus fracture, shoulder fracture, PTX.  Feeling better.   - Supportive care, heating pad, analgesics etc.  Right arm swelling -Suspect thrombophlebitis at the site of previous IV -Ultrasound today negative for superficial or deep venous thrombosis -Recommend supportive care, heating pad and arm elevation   Hypomagnesemia - Supplemented   PVD (peripheral vascular disease) carotid - Continue statin    Hyperlipidemia - Continue atorvastatin   AKI (acute kidney injury) (HCC) Baseline 0.9-1.1, here was 1.7 up to 1.9 on admission.  Now resolved.   Hypothyroidism - Continue levothyroxine   Mild aortic stenosis   -Follow-up with cardiology   Paroxysmal atrial fibrillation (HCC) - Held Eliquis for PPM, this was completed yesterday 6/6 per EP Eliquis can be resumed in 5 days on 6/12   CAD s/p CABG 3/12  -Coreg discontinued, continue Imdur - Continue atorvastatin    Abdominal aortic aneurysm (HCC) - Continue atorvastatin   Essential hypertension -Stable, restarted Imdur, valsartan and Aldactone    Procedures: Dual-chamber ICD with Biotronik placed by Dr. LQuentin Ore6/6  Consultations: Cardiology, EP  Discharge Exam: Vitals:   02/26/22 0247 02/26/22 0730  BP: (!) 150/76 (!) 141/95  Pulse: 63 71  Resp: 19 (!)  23  Temp: 98.5 F (36.9 C) 97.7 F (36.5 C)  SpO2: 96% 95%   Gen: Awake, Alert, Oriented X 3,  HEENT: no JVD Lungs: Good air movement bilaterally, CTAB CVS: S1S2/RRR Abd: soft, Non tender, non distended, BS present Extremities: Mild swelling in right antecubital fossa Skin: no new rashes on exposed skin    Discharge  Instructions   Discharge Instructions     Diet - low sodium heart healthy   Complete by: As directed    Discharge wound care:   Complete by: As directed    routine   Increase activity slowly   Complete by: As directed       Allergies as of 02/26/2022       Reactions   Ancef [cefazolin Sodium] Hives   Empagliflozin Other (See Comments)   Dizziness   Vancomycin Rash        Medication List     TAKE these medications    acetaminophen 500 MG tablet Commonly known as: TYLENOL Take 1,000 mg by mouth every 6 (six) hours as needed for moderate pain or headache.   apixaban 5 MG Tabs tablet Commonly known as: ELIQUIS Take 1 tablet (5 mg total) by mouth 2 (two) times daily. Restart after 5 days on 6/12 Start taking on: March 03, 2022 What changed:  additional instructions These instructions start on March 03, 2022. If you are unsure what to do until then, ask your doctor or other care provider.   atorvastatin 80 MG tablet Commonly known as: LIPITOR Take 1 tablet (80 mg total) by mouth daily.   Carboxymethylcellulose Sod PF 0.5 % Soln Place 1 drop into both eyes See admin instructions. Instill 1 drop into both eyes two to four times a day as needed for dryness   CENTRUM SILVER PO Take 1 tablet by mouth See admin instructions. Take 1 tablet by mouth every other day with breakfast   fexofenadine 180 MG tablet Commonly known as: ALLEGRA Take 180 mg by mouth daily.   furosemide 40 MG tablet Commonly known as: LASIX Take 0.5 tablets (20 mg total) by mouth as needed.   isosorbide mononitrate 30 MG 24 hr tablet Commonly known as: IMDUR Take 1 tablet (30 mg total) by mouth daily.   levothyroxine 75 MCG tablet Commonly known as: SYNTHROID Take 75 mcg by mouth daily before breakfast. What changed: Another medication with the same name was removed. Continue taking this medication, and follow the directions you see here.   pantoprazole 40 MG tablet Commonly known as:  PROTONIX Take 40 mg by mouth daily before breakfast.   spironolactone 25 MG tablet Commonly known as: ALDACTONE Take 1 tablet (25 mg total) by mouth daily. What changed: when to take this   tamsulosin 0.4 MG Caps capsule Commonly known as: FLOMAX Take 0.4 mg by mouth every evening.   traMADol 50 MG tablet Commonly known as: ULTRAM Take 1 tablet (50 mg total) by mouth every 6 (six) hours as needed for moderate pain.   valsartan 40 MG tablet Commonly known as: Diovan Take 0.5 tablets (20 mg total) by mouth 2 (two) times daily.   Vitamin D-3 25 MCG (1000 UT) Caps Take 1,000 Units by mouth daily.               Discharge Care Instructions  (From admission, onward)           Start     Ordered   02/26/22 0000  Discharge wound care:  Comments: routine   02/26/22 1410           Allergies  Allergen Reactions   Ancef [Cefazolin Sodium] Hives   Empagliflozin Other (See Comments)    Dizziness   Vancomycin Rash      The results of significant diagnostics from this hospitalization (including imaging, microbiology, ancillary and laboratory) are listed below for reference.    Significant Diagnostic Studies: DG Chest 2 View  Result Date: 02/26/2022 CLINICAL DATA:  Encounter for ICD placement EXAM: CHEST - 2 VIEW COMPARISON:  February 22, 2022 FINDINGS: The heart size and mediastinal contours are within normal limits. Both lungs are clear. The visualized skeletal structures are unremarkable. IMPRESSION: No active cardiopulmonary disease. Electronically Signed   By: Abelardo Diesel M.D.   On: 02/26/2022 07:15   CT Head Wo Contrast  Result Date: 02/22/2022 CLINICAL DATA:  Fall, head trauma. EXAM: CT HEAD WITHOUT CONTRAST TECHNIQUE: Contiguous axial images were obtained from the base of the skull through the vertex without intravenous contrast. RADIATION DOSE REDUCTION: This exam was performed according to the departmental dose-optimization program which includes automated  exposure control, adjustment of the mA and/or kV according to patient size and/or use of iterative reconstruction technique. COMPARISON:  None Available. FINDINGS: Brain: Brain volume is normal for age. No intracranial hemorrhage, mass effect, or midline shift. No hydrocephalus. The basilar cisterns are patent. No evidence of territorial infarct or acute ischemia. No extra-axial or intracranial fluid collection. Vascular: Atherosclerosis of skullbase vasculature without hyperdense vessel or abnormal calcification. Skull: No fracture or focal lesion. Sinuses/Orbits: Paranasal sinuses and mastoid air cells are clear. The visualized orbits are unremarkable. Other: Left parietooccipital scalp hematoma with laceration and skin staples. IMPRESSION: Left parietooccipital scalp hematoma with laceration and skin staples. No acute intracranial abnormality. No skull fracture. Electronically Signed   By: Keith Rake M.D.   On: 02/22/2022 18:20   CT Cervical Spine Wo Contrast  Result Date: 02/22/2022 CLINICAL DATA:  Neck trauma, fall. EXAM: CT CERVICAL SPINE WITHOUT CONTRAST TECHNIQUE: Multidetector CT imaging of the cervical spine was performed without intravenous contrast. Multiplanar CT image reconstructions were also generated. RADIATION DOSE REDUCTION: This exam was performed according to the departmental dose-optimization program which includes automated exposure control, adjustment of the mA and/or kV according to patient size and/or use of iterative reconstruction technique. COMPARISON:  None Available. FINDINGS: Alignment: Normal. Skull base and vertebrae: No acute fracture. Vertebral body heights are maintained. The dens and skull base are intact. Incidental note of non fusion of the posterior arch of C1, variant anatomy. Soft tissues and spinal canal: No prevertebral fluid or swelling. No visible canal hematoma. Disc levels:  Mild degenerative disc disease C4-C5 and C5-C6. Upper chest: Cyst on concurrent chest  CT, reported separately. Other: Bilateral carotid stents. IMPRESSION: Mild degenerative disc disease. No acute fracture or subluxation of the cervical spine. Electronically Signed   By: Keith Rake M.D.   On: 02/22/2022 18:17   CT CHEST ABDOMEN PELVIS W CONTRAST  Result Date: 02/22/2022 CLINICAL DATA:  88 year old post fall. EXAM: CT CHEST, ABDOMEN, AND PELVIS WITH CONTRAST TECHNIQUE: Multidetector CT imaging of the chest, abdomen and pelvis was performed following the standard protocol during bolus administration of intravenous contrast. RADIATION DOSE REDUCTION: This exam was performed according to the departmental dose-optimization program which includes automated exposure control, adjustment of the mA and/or kV according to patient size and/or use of iterative reconstruction technique. CONTRAST:  164m OMNIPAQUE IOHEXOL 300 MG/ML  SOLN COMPARISON:  Chest radiograph earlier  today.  Chest CTA 12/31/2021 FINDINGS: CT CHEST FINDINGS Cardiovascular: No evidence of acute aortic or vascular injury. Moderate aortic atherosclerosis. No aortic aneurysm. Post CABG with dense calcification of native coronary arteries. Mild cardiomegaly. Pseudoaneurysm arising from the inferior left ventricular wall measures 3.6 cm, unchanged from prior CT, no associated stranding. Mitral annulus calcifications. No pericardial effusion. Mediastinum/Nodes: No mediastinal hemorrhage. No adenopathy. Patulous esophagus. No pneumomediastinum. Lungs/Pleura: Small chronic left pleural effusion with sequela of prior talc pleurodesis, high density along the pleural surface at the left lung base. There is associated scarring in the left lower lobe. Chronic fluid tracking into the inter lobar fissure on the left. No pneumothorax or pulmonary contusion. Chronic biapical pleuroparenchymal scarring. No acute airspace disease. Musculoskeletal: Prior median sternotomy, no sternal fracture. Diffuse thoracic spondylosis, no acute thoracic spine  fracture. No acute fracture of the ribs, included clavicles or shoulder girdles. There is no confluent chest wall hematoma. CT ABDOMEN PELVIS FINDINGS Hepatobiliary: No hepatic injury or perihepatic hematoma. There are multiple scattered tiny hepatic hypodensities are too small to accurately characterize but likely represent small cysts or biliary hamartomas. Gallbladder is unremarkable. Pancreas: No injury. Fatty atrophy of the head and uncinate process. No ductal dilatation or inflammation. Spleen: No splenic injury or perisplenic hematoma. Adrenals/Urinary Tract: No adrenal hemorrhage or renal injury identified. There are bilateral renal cysts that need no further follow-up. Bladder is unremarkable. Stomach/Bowel: Ingested material within the stomach with equivocal gastric wall thickening, nontraumatic. Small mural lipoma within the duodenum, nonobstructing. There is no evidence of bowel injury or mesenteric hematoma. Colonic diverticulosis without diverticulitis. The sigmoid colon is redundant. The appendix is not confidently visualized. Vascular/Lymphatic: No aortic or vascular injury. Moderate aortic atherosclerosis. 5 cm infrarenal aortic aneurysm without periaortic stranding or evidence of rupture. There is no retroperitoneal fluid or stranding. No adenopathy. Reproductive: Enlarged prostate gland causing mild mass effect on the bladder base. Penile prosthesis with decompressed pump in the midline. Other: No free air or free fluid. There is no confluent body wall contusion. Small bilateral fat containing inguinal hernias. Musculoskeletal: No acute fracture of the pelvis. Moderate lumbar degenerative change without acute fracture. IMPRESSION: 1. No evidence of acute traumatic injury to the chest, abdomen, or pelvis. 2. Small chronic left pleural effusion with sequela of prior talc pleurodesis, high density along the pleural surface at the left lung base. 3. Pseudoaneurysm of the inferior left ventricular wall  measuring 3.6 cm, unchanged from prior CT, no associated stranding. 4. Colonic diverticulosis without diverticulitis. 5. Enlarged prostate gland causing mild mass effect on the bladder base. 6. Infrarenal abdominal aortic aneurysm, 5 cm. Recommend follow-up every 6 months and vascular consultation. This recommendation follows ACR consensus guidelines: White Paper of the ACR Incidental Findings Committee II on Vascular Findings. J Am Coll Radiol 2013; 10:789-794. Aortic Atherosclerosis (ICD10-I70.0). Electronically Signed   By: Keith Rake M.D.   On: 02/22/2022 18:15   EP PPM/ICD IMPLANT  Result Date: 02/25/2022  CONCLUSIONS:  1. Ischemic cardiomyopathy, syncope and inducible sustained monomorphic ventricular tachycardia  2. Successful ICD implantation with a Biotronik DDD ICD implanted for secondary prevention of sudden death.  3.  No early apparent complications.   DG Chest Port 1 View  Result Date: 02/22/2022 CLINICAL DATA:  Chest pain. EXAM: PORTABLE CHEST 1 VIEW COMPARISON:  December 31, 2021 FINDINGS: Chronic mild opacity in left base, likely scarring. Stable cardiomegaly. The hila and mediastinum are unchanged. Sternotomy wires are intact. No pneumothorax. No nodules or masses. IMPRESSION: No active disease. Electronically  Signed   By: Dorise Bullion III M.D.   On: 02/22/2022 16:14   LONG TERM MONITOR (3-14 DAYS)  Result Date: 02/09/2022 Patch Wear Time:  13 days and 8 hours (2023-04-27T11:52:04-399 to 2023-05-10T20:20:01-399) Patient had a min HR of 43 bpm, max HR of 174 bpm, and avg HR of 65 bpm. Predominant underlying rhythm was Sinus Rhythm. Bundle Branch Block/IVCD was present. 4 Ventricular Tachycardia runs occurred, the run with the fastest interval lasting 5 beats with  a max rate of 174 bpm (avg 101 bpm); the run with the fastest interval was also the longest. Atrial Fibrillation/Flutter occurred (2% burden), ranging from 61-151 bpm (avg of 99 bpm), the longest lasting 1 hour 6 mins  with an avg rate of 82 bpm. Atrial Flutter may be possible Atrial Tachycardia with variable block. 3 Pauses occurred, the longest lasting 3.4 secs (18 bpm). Isolated SVEs were frequent (8.3%, [id number]), SVE Couplets were occasional (1.1%, 6805), and SVE Triplets were rare (<1.0%, 1312). Isolated VEs were rare (<1.0%, 4751), VE Couplets were rare (<1.0%, 220), and VE Triplets were rare (<1.0%, 6). Ventricular Trigeminy was present. Conclusion: 1. Predominantly NSR. 2. 2% atrial fibrillation burden. 3. 4 NSVT runs, longest lasting 5 beats. 4. Rare PVCs   VAS [place] UPPER EXTREMITY VENOUS DUPLEX  Result Date: 02/26/2022 UPPER VENOUS STUDY  Patient Name:  Eric Gordon  Date of Exam:   02/26/2022 Medical Rec #: [id number]            Accession #:    [id number] Date of Birth: 1931/05/13           Patient Gender: M Patient Age:   25 years Exam Location:  [facility] Procedure:      VAS [place] UPPER EXTREMITY VENOUS DUPLEX Referring Phys: Domenic Polite --------------------------------------------------------------------------------  Indications: Swelling, and Erythema Comparison Study: No prior studies. Performing Technologist: Darlin Coco RDMS, RVT  Examination Guidelines: A complete evaluation includes B-mode imaging, spectral Doppler, color Doppler, and power Doppler as needed of all accessible portions of each vessel. Bilateral testing is considered an integral part of a complete examination. Limited examinations for reoccurring indications may be performed as noted.  Right Findings: +----------+------------+---------+-----------+----------+-------+ RIGHT     CompressiblePhasicitySpontaneousPropertiesSummary +----------+------------+---------+-----------+----------+-------+ IJV           Full       Yes       Yes                      +----------+------------+---------+-----------+----------+-------+ Subclavian               Yes       Yes                       +----------+------------+---------+-----------+----------+-------+ Axillary      Full       Yes       Yes                      +----------+------------+---------+-----------+----------+-------+ Brachial      Full                                          +----------+------------+---------+-----------+----------+-------+ Radial        Full                                          +----------+------------+---------+-----------+----------+-------+  Ulnar         Full                                          +----------+------------+---------+-----------+----------+-------+ Cephalic      Full                                          +----------+------------+---------+-----------+----------+-------+ Basilic       Full                                          +----------+------------+---------+-----------+----------+-------+  Left Findings: +----------+------------+---------+-----------+----------+-------+ LEFT      CompressiblePhasicitySpontaneousPropertiesSummary +----------+------------+---------+-----------+----------+-------+ Subclavian               Yes       Yes                      +----------+------------+---------+-----------+----------+-------+  Summary:  Right: No evidence of deep vein thrombosis in the upper extremity. No evidence of superficial vein thrombosis in the upper extremity.  Left: No evidence of thrombosis in the subclavian.  *See table(s) above for measurements and observations.    Preliminary    ECHOCARDIOGRAM LIMITED  Result Date: 02/23/2022    ECHOCARDIOGRAM LIMITED REPORT   Patient Name:   Eric Gordon Date of Exam: 02/23/2022 Medical Rec #:  [id number]           Height:       75.5 in Accession #:    [id number]          Weight:       205.9 lb Date of Birth:  08/31/1931          BSA:          2.233 m Patient Age:    32 years            BP:           103/40 mmHg Patient Gender: M                   HR:           57 bpm. Exam Location:   Inpatient Procedure: Limited Echo, Cardiac Doppler, Color Doppler and Intracardiac            Opacification Agent Indications:    R55 Syncope  History:        Patient has prior history of Echocardiogram examinations, most                 recent 01/01/2022. CAD and Previous Myocardial Infarction, Prior                 CABG, Aortic Valve Disease, Signs/Symptoms:Syncope, Dyspnea,                 Shortness of Breath and Dizziness/Lightheadedness; Risk                 Factors:Sleep Apnea and Hypertension.  Sonographer:    Roseanna Rainbow RDCS Referring Phys: [id number] [place]  Sonographer Comments: Technically difficult study due to poor echo windows. Patient supine, could not turn due to recent fall. IMPRESSIONS  1. There is a small aneurysm  of the basal inferolateral wall. Thrombus is not seen. Left ventricular ejection fraction, by estimation, is 40 to 45%. The left ventricle has mildly decreased function. The left ventricle demonstrates regional wall motion abnormalities (see scoring diagram/findings for description). There is mild concentric left ventricular hypertrophy. Left ventricular diastolic function could not be evaluated.  2. Right ventricular systolic function is normal. The right ventricular size is normal. There is normal pulmonary artery systolic pressure. The estimated right ventricular systolic pressure is 74.2 mmHg.  3. 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 A small partly mobile calcified component may be present. The mitral valve is degenerative. No evidence of mitral stenosis. Moderate to severe mitral annular calcification.  4. Tricuspid valve regurgitation is mild to moderate.  5. The aortic valve is tricuspid. There is moderate calcification of the aortic valve. There is moderate thickening of the aortic valve. Aortic valve regurgitation is not visualized. Mild to moderate aortic valve stenosis.  6. There is borderline dilatation of the ascending aorta, measuring 39 mm.  7. The  inferior vena cava is normal in size with greater than 50% respiratory variability, suggesting right atrial pressure of 3 mmHg. Comparison(s): No significant change from prior study. Prior images reviewed side by side. FINDINGS  Left Ventricle: There is a small aneurysm of the basal inferolateral wall. Thrombus is not seen. Left ventricular ejection fraction, by estimation, is 40 to 45%. The left ventricle has mildly decreased function. The left ventricle demonstrates regional wall motion abnormalities. Definity contrast agent was given IV to delineate the left ventricular endocardial borders. The left ventricular internal cavity size was normal in size. There is mild concentric left ventricular hypertrophy. Left ventricular diastolic function could not be evaluated.  LV Wall Scoring: The basal inferolateral segment is aneurysmal. The mid inferolateral segment and basal anterolateral segment are akinetic. Right Ventricle: The right ventricular size is normal. No increase in right ventricular wall thickness. Right ventricular systolic function is normal. There is normal pulmonary artery systolic pressure. The tricuspid regurgitant velocity is 2.18 m/s, and  with an assumed right atrial pressure of 3 mmHg, the estimated right ventricular systolic pressure is 59.5 mmHg. Pericardium: There is no evidence of pericardial effusion. 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 A small partly mobile calcified component may be present. The mitral valve is degenerative in appearance. Moderate to severe mitral annular calcification. No evidence of mitral valve stenosis. Tricuspid Valve: The tricuspid valve is normal in structure. Tricuspid valve regurgitation is mild to moderate. Aortic Valve: The aortic valve is tricuspid. There is moderate calcification of the aortic valve. There is moderate thickening of the aortic valve. Aortic valve regurgitation is not visualized. Mild to moderate aortic  stenosis is present. Aortic valve mean gradient measures 18.6 mmHg. Aortic valve peak gradient measures 32.9 mmHg. Aortic valve area, by VTI measures 1.42 cm. Pulmonic Valve: The pulmonic valve was normal in structure. Pulmonic valve regurgitation is trivial. Aorta: The aortic root is normal in size and structure. There is borderline dilatation of the ascending aorta, measuring 39 mm. Venous: The inferior vena cava is normal in size with greater than 50% respiratory variability, suggesting right atrial pressure of 3 mmHg. LEFT VENTRICLE PLAX 2D LVIDd:         5.80 cm LVIDs:         4.30 cm LV PW:         1.40 cm LV IVS:        1.30 cm  LVOT diam:     2.20 cm LV SV:         94 LV SV Index:   42 LVOT Area:     3.80 cm  LV Volumes (MOD) LV vol d, MOD A4C: 152.0 ml LV vol s, MOD A4C: 87.6 ml LV SV MOD A4C:     152.0 ml LEFT ATRIUM         Index LA diam:    4.00 cm 1.79 cm/m  AORTIC VALVE AV Area (Vmax):    1.51 cm AV Area (Vmean):   1.43 cm AV Area (VTI):     1.42 cm AV Vmax:           286.80 cm/s AV Vmean:          200.400 cm/s AV VTI:            0.657 m AV Peak Grad:      32.9 mmHg AV Mean Grad:      18.6 mmHg LVOT Vmax:         114.00 cm/s LVOT Vmean:        75.600 cm/s LVOT VTI:          0.246 m LVOT/AV VTI ratio: 0.37  AORTA Ao Root diam: 3.70 cm Ao Asc diam:  3.90 cm TRICUSPID VALVE TR Peak grad:   19.0 mmHg TR Vmax:        218.00 cm/s  SHUNTS Systemic VTI:  0.25 m Systemic Diam: 2.20 cm Dani Gobble Croitoru MD Electronically signed by Sanda Klein MD Signature Date/Time: 02/23/2022/11:14:27 AM    Final     Microbiology: Recent Results (from the past 240 hour(s))  MRSA Next Gen by PCR, Nasal     Status: None   Collection Time: 02/22/22  9:35 PM   Specimen: Nasal Mucosa; Nasal Swab  Result Value Ref Range Status   MRSA by PCR Next Gen NOT DETECTED NOT DETECTED Final    Comment: (NOTE) The GeneXpert MRSA Assay (FDA approved for NASAL specimens only), is one component of a comprehensive MRSA colonization  surveillance program. It is not intended to diagnose MRSA infection nor to guide or monitor treatment for MRSA infections. Test performance is not FDA approved in patients less than 61 years old. Performed at [facility], [place] [address] [place], [place] [address]   Surgical PCR screen     Status: None   Collection Time: 02/23/22  3:02 PM   Specimen: Nasal Mucosa; Nasal Swab  Result Value Ref Range Status   MRSA, PCR NEGATIVE NEGATIVE Final   Staphylococcus aureus NEGATIVE NEGATIVE Final    Comment: (NOTE) The Xpert SA Assay (FDA approved for NASAL specimens in patients 35 years of age and older), is one component of a comprehensive surveillance program. It is not intended to diagnose infection nor to guide or monitor treatment. Performed at [facility], [place] [address] [place], [place] [address]   Surgical PCR screen     Status: None   Collection Time: 02/25/22 11:15 AM   Specimen: Nasal Mucosa; Nasal Swab  Result Value Ref Range Status   MRSA, PCR NEGATIVE NEGATIVE Final   Staphylococcus aureus NEGATIVE NEGATIVE Final    Comment: (NOTE) The Xpert SA Assay (FDA approved for NASAL specimens in patients 37 years of age and older), is one component of a comprehensive surveillance program. It is not intended to diagnose infection nor to guide or monitor treatment. Performed at [facility], [place] [address], [place], [place]  [place]: Basic Metabolic Panel: Recent Labs  Lab 02/22/22 1649 02/22/22 1656 02/23/22 0111 02/23/22 1920 02/24/22 0613 02/25/22 0154 02/26/22 0047  NA 142 141 142  --  138 136 135  K 4.2 4.0 4.0 4.3 4.4 4.2 4.1  CL 107 108 107  --  104 105 103  CO2 27  --  28  --  '28 24 26  '$ GLUCOSE 98 106* 113*  --  110* 123* 120*  BUN 31* 37* 29*  --  24* 22 17  CREATININE 1.68* 1.90* 1.43*  --  1.22 1.12 0.95  CALCIUM 9.8  --  9.3  --  9.2 8.9 9.1  MG  --   --  1.9 1.8  --  1.6* 2.2  PHOS  --   --  4.7*  --   --   --   --     Liver Function Tests: Recent Labs  Lab 02/22/22 1649 02/23/22 0111  AST 25 25  ALT 14 15  ALKPHOS 71 71  BILITOT 0.6 0.6  PROT 6.6 6.8  ALBUMIN 3.7 3.8   No results for input(s): LIPASE, AMYLASE in the last 168 hours. No results for input(s): AMMONIA in the last 168 hours. CBC: Recent Labs  Lab 02/22/22 1649 02/22/22 1656 02/23/22 0111 02/26/22 0047  WBC 9.7  --  9.7 7.4  NEUTROABS 7.3  --  6.3  --   HGB 12.8* 13.3 13.0 12.8*  HCT 40.5 39.0 41.1 40.4  MCV 100.7*  --  99.5 98.5  PLT 145*  --  143* 129*   Cardiac Enzymes: No results for input(s): CKTOTAL, CKMB, CKMBINDEX, TROPONINI in the last 168 hours. BNP: BNP (last 3 results) Recent Labs    11/15/21 1301 12/31/21 0902 01/16/22 1124  BNP 331.5* 341.1* 253.2*    ProBNP (last 3 results) No results for input(s): PROBNP in the last 8760 hours.  CBG: No results for input(s): GLUCAP in the last 168 hours.     Signed:  Domenic Polite MD.  [facility] 02/26/2022, 2:10 PM

## 2022-02-26 NOTE — Progress Notes (Signed)
Nursing DC note  Patient alert and oriented, both patient and wife verbalized understanding of dc instructions.all belongings and dc papers given to patient.

## 2022-02-26 NOTE — Progress Notes (Signed)
Upper extremity venous right study completed.  Preliminary results relayed to Broadus John, MD.  See CV Proc for preliminary results report.   Darlin Coco, RDMS, RVT

## 2022-02-26 NOTE — Progress Notes (Signed)
Progress Note  Patient Name: Eric Gordon Date of Encounter: 02/26/2022  [facility] Cardiologist: Dr. Aundra Dubin  Subjective   Feels better, still sore from Eric fall/syncope, but improving slowly.  No CP outside of chest wall, no SOB  Inpatient Medications    Scheduled Meds:  atorvastatin  80 mg Oral Daily   furosemide  20 mg Intravenous Once   levothyroxine  75 mcg Oral QAC breakfast   pantoprazole  40 mg Oral Daily   sodium chloride flush  3 mL Intravenous Q12H   spironolactone  25 mg Oral Daily   tamsulosin  0.4 mg Oral QPC supper   Continuous Infusions:  sodium chloride     PRN Meds: sodium chloride, acetaminophen, melatonin, ondansetron (ZOFRAN) IV, polyethylene glycol, prochlorperazine, sodium chloride flush, traMADol   Vital Signs    Vitals:   02/25/22 1945 02/25/22 2309 02/26/22 0247 02/26/22 0730  BP: 140/89 119/62 (!) 150/76 (!) 141/95  Pulse: 90 61 63 71  Resp: '18 19 19 '$ (!) 23  Temp:  98.2 F (36.8 C) 98.5 F (36.9 C) 97.7 F (36.5 C)  TempSrc:  Oral Oral Oral  SpO2: 91% 90% 96% 95%  Weight:   94.7 kg   Height:        Intake/Output Summary (Last 24 hours) at 02/26/2022 1001 Last data filed at 02/26/2022 0738 Gross per 24 hour  Intake 111.08 ml  Output 1000 ml  Net -888.92 ml      02/26/2022    2:47 AM 02/25/2022    5:00 AM 02/24/2022    4:43 AM  Last 3 Weights  Weight (lbs) 208 lb 12.4 oz 207 lb 14.3 oz 205 lb 7.5 oz  Weight (kg) 94.7 kg 94.3 kg 93.2 kg      Telemetry    Largely AP/VS, some AVpacing - Personally Reviewed  ECG    AP/VS 60 - Personally Reviewed  Physical Exam   GEN: No acute distress.   Posterior head wound is clean/dry, staples in place Neck: No JVD Cardiac: RRR, 2/6SM, rubs, or gallops.  Respiratory: CTA b/l. GI: Soft, nontender, non-distended  MS: No edema; No deformity. Neuro:  Nonfocal  Psych: Normal affect   ICD site is stable, no bleeding or hematoma  Labs    High Sensitivity Troponin:   Recent Labs   Lab 02/22/22 1649  TROPONINIHS 14     Chemistry Recent Labs  Lab 02/22/22 1649 02/22/22 1656 02/23/22 0111 02/23/22 1920 02/24/22 0613 02/25/22 0154 02/26/22 0047  NA 142   < > 142  --  138 136 135  K 4.2   < > 4.0 4.3 4.4 4.2 4.1  CL 107   < > 107  --  104 105 103  CO2 27  --  28  --  '28 24 26  '$ GLUCOSE 98   < > 113*  --  110* 123* 120*  BUN 31*   < > 29*  --  24* 22 17  CREATININE 1.68*   < > 1.43*  --  1.22 1.12 0.95  CALCIUM 9.8  --  9.3  --  9.2 8.9 9.1  MG  --    < > 1.9 1.8  --  1.6* 2.2  PROT 6.6  --  6.8  --   --   --   --   ALBUMIN 3.7  --  3.8  --   --   --   --   AST 25  --  25  --   --   --   --  ALT 14  --  15  --   --   --   --   ALKPHOS 71  --  71  --   --   --   --   BILITOT 0.6  --  0.6  --   --   --   --   GFRNONAA 39*  --  47*  --  57* >60 >60  ANIONGAP 8  --  7  --  '6 7 6   '$ < > = values in this interval not displayed.    Lipids  No results for input(s): CHOL, TRIG, HDL, LABVLDL, LDLCALC, CHOLHDL in the last 168 hours.   Hematology Recent Labs  Lab 02/22/22 1649 02/22/22 1656 02/23/22 0111 02/26/22 0047  WBC 9.7  --  9.7 7.4  RBC 4.02*  --  4.13* 4.10*  HGB 12.8* 13.3 13.0 12.8*  HCT 40.5 39.0 41.1 40.4  MCV 100.7*  --  99.5 98.5  MCH 31.8  --  31.5 31.2  MCHC 31.6  --  31.6 31.7  RDW 14.4  --  14.6 13.7  PLT 145*  --  143* 129*   Thyroid  Recent Labs  Lab 02/23/22 1225  TSH 2.442    BNPNo results for input(s): BNP, PROBNP in the last 168 hours.  DDimer No results for input(s): DDIMER in the last 168 hours.   Radiology     Cardiac Studies   May 2023 Conclusion:  1. Predominantly NSR.  2. 2% atrial fibrillation burden.  3. 4 NSVT runs, longest lasting 5 beats.  4. Rare PVCs  02/23/22: TTE  1. There is a small aneurysm of the basal inferolateral wall. Thrombus is  not seen. Left ventricular ejection fraction, by estimation, is 40 to 45%.  The left ventricle has mildly decreased function. The left ventricle  demonstrates  regional wall motion  abnormalities (see scoring diagram/findings for description). There is  mild concentric left ventricular hypertrophy. Left ventricular diastolic  function could not be evaluated.   2. Right ventricular systolic function is normal. The right ventricular  size is normal. There is normal pulmonary artery systolic pressure. The  estimated right ventricular systolic pressure is 44.3 mmHg.   3. 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 A small  partly mobile calcified component may be present. The mitral valve is  degenerative. No evidence of mitral stenosis. Moderate to severe mitral  annular calcification.   4. Tricuspid valve regurgitation is mild to moderate.   5. The aortic valve is tricuspid. There is moderate calcification of the  aortic valve. There is moderate thickening of the aortic valve. Aortic  valve regurgitation is not visualized. Mild to moderate aortic valve  stenosis.   6. There is borderline dilatation of the ascending aorta, measuring 39  mm.   7. The inferior vena cava is normal in size with greater than 50%  respiratory variability, suggesting right atrial pressure of 3 mmHg.    11/21/2021: stress myoview   Findings are consistent with prior myocardial infarction. The study is high risk.   There were ST depressions in the anterior leads and frequent brief runs of NSVT as well as isolated PVCs during stress.   LV perfusion is abnormal. There is a medium sized, severe fixed defect in the basal-to-apical inferior and inferolateral LV walls. There is a small fixed defect in the basal-to-mid anterolateral LV wall. There is abnormal wall motion in the defect area. Consistent with infarction.   Left ventricular function  is abnormal. Nuclear stress EF: 37 %. The left ventricular ejection fraction is moderately decreased (30-44%). End diastolic cavity size is moderately enlarged.   Prior study available for comparison from 08/28/2020. Compared to  prior study, the inferior and inferolateral defect is now seen in the apical segments as well as the basal-to-mid segments. There is also a basal-to-mid anterolateral fixed defect.  Patient Profile     88 y.o. male CAD s/p CABG, DOE, spontaneous PTX, b/l CAS s/p R/L TCAR (2021/2022), AAA, OSA, prior PE, AF, LV aneurysm, RBBB,  and tremor.  Admitted with syncope  Assessment & Plan    Syncope 2. Conduction system disease with baseline RBBB/LAD (intermittent 1st degree AVBlock)     Post termination pauses (approx 3-3.4 seconds) 3. NSVT seen on stress test and monitor, inducible VT by EPS yesterday 4. LV aneurysm 5. CM (presumed ischemic)      LVEF 40's w/RBBB  S/p ICD implant yesterday with Dr. Quentin Ore Site is stable CXR this Am without ptx Device check this AM with stable measurements Wound care and activity restrictions discussed with the patient and Eric Gordon EP follow up is in place No driving 51mo patient/Gordon are aware  In d/w Dr. MAundra Dubin no AAD at this time, OK for betablocker, defer choice to HF team    6. Paroxysmal Afib CHA2DS2Vasc is 5, on Eliquis, held here for procedures PLEASE HOLD ELIQUIS FOR 5 DAYS Resume on 03/03/22  Historically off amiodarone with concerns of pulmonary toxicity   For questions or updates, [facility] Please consult [email or web address] for contact info under        Signed, RBaldwin Jamaica PA-C  02/26/2022, 10:01 AM

## 2022-02-26 NOTE — Discharge Instructions (Signed)
    Supplemental Discharge Instructions for  Pacemaker/Defibrillator Patients   Activity No heavy lifting or vigorous activity with your left/right arm for 6 to 8 weeks.  Do not raise your left/right arm above your head for one week.  Gradually raise your affected arm as drawn below.              03/01/22                    03/02/22                    03/03/22                  03/04/22 __  NO DRIVING for 6 months  WOUND CARE Keep the wound area clean and dry.  Do not get this area wet , no showers for one week; you may shower on  03/04/22   . The tape/steri-strips on your wound will fall off; do not pull them off.  No bandage is needed on the site.  DO  NOT apply any creams, oils, or ointments to the wound area. If you notice any drainage or discharge from the wound, any swelling or bruising at the site, or you develop a fever > 101? F after you are discharged home, call the office at once.  Special Instructions You are still able to use cellular telephones; use the ear opposite the side where you have your pacemaker/defibrillator.  Avoid carrying your cellular phone near your device. When traveling through airports, show security personnel your identification card to avoid being screened in the metal detectors.  Ask the security personnel to use the hand wand. Avoid arc welding equipment, MRI testing (magnetic resonance imaging), TENS units (transcutaneous nerve stimulators).  Call the office for questions about other devices. Avoid electrical appliances that are in poor condition or are not properly grounded. Microwave ovens are safe to be near or to operate.  Additional information for defibrillator patients should your device go off: If your device goes off ONCE and you feel fine afterward, notify the device clinic nurses. If your device goes off ONCE and you do not feel well afterward, call 911. If your device goes off TWICE, call 911. If your device goes off THREE times in one day,  call 911.  DO NOT DRIVE YOURSELF OR A FAMILY MEMBER WITH A DEFIBRILLATOR TO THE HOSPITAL--CALL 911.

## 2022-02-26 NOTE — Progress Notes (Signed)
Physical Therapy Treatment Patient Details Name: Eric Gordon MRN: [id number] DOB: 01/31/1931 Today's Date: 02/26/2022   History of Present Illness Eric Gordon is a 88 y.o. male presented to [facility] ED from home after a syncopal episode while working on his deck. 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 Underwent EP study on 02/24/22 showing inducible VT. PPM and defibrillator placed 02/25/22.    PT Comments    Pt is slowly progressing towards PT goals. Pt continues to present with generalized weakness, decreased tolerance to activity, and balance deficits. Pt was mod A for bed mobility with head of bed flat, requiring cues for log roll technique and use of bed rails. Pt continues to be min A for transfers and ambulation for safety with use of RW. Today's session focused on functional mobility training and stair training, with spouse education on assisting pt at home. Pt and spouse were educated on post-op mobility and precautions, use of momentum for transfers, and safe stair navigation. Provided PPM handout and gait belt. Also recommended sleeping in recliner for the first couple of nights due to amount of assist needed for getting out of bed. Pt tolerated 350 ft of ambulation with use of RW and HR ranging 77-99 bpm. Pt also tolerated stair training sideways with use of L hand rail for 8 stairs with one standing rest break after 4 stairs. Pt would continue to benefit from skilled PT for continued functional mobility training, balance training, and stair training for safe discharge home, increase independence, and improve mobility. Home with home health PT remains appropriate for discharge. Will continue to follow acutely.       Recommendations for follow up therapy are one component of a multi-disciplinary discharge planning process, led by the attending physician.  Recommendations may be updated based on patient status, additional functional criteria and insurance authorization.  Follow Up Recommendations  Home health PT     Assistance Recommended at Discharge Intermittent Supervision/Assistance  Patient can return home with the following A little help with walking and/or transfers;A little help with bathing/dressing/bathroom;Assistance with cooking/housework;Help with stairs or ramp for entrance;Assist for transportation   Equipment Recommendations  None recommended by PT    Recommendations for Other Services       Precautions / Restrictions Precautions Precautions: Fall Precaution Comments: watch HR/02, PPM L arm precautions Restrictions Weight Bearing Restrictions: Yes LUE Weight Bearing: Non weight bearing     Mobility  Bed Mobility Overal bed mobility: Needs Assistance Bed Mobility: Rolling, Sidelying to Sit Rolling: Mod assist Sidelying to sit: Mod assist       General bed mobility comments: head of bed flat, pt required cues for use of rails and log rolling technique, required mod A for trunk elevation and min A with LEs.    Transfers Overall transfer level: Needs assistance Equipment used: Rolling walker (2 wheels) Transfers: Sit to/from Stand, Bed to chair/wheelchair/BSC Sit to Stand: Min assist   Step pivot transfers: Min assist       General transfer comment: min A to stand from EOB x1 requiring cues for use of momentum, hand placement on RW, and L arm precautions. from edge of chair x3.    Ambulation/Gait Ambulation/Gait assistance: Min assist Gait Distance (Feet): 350 Feet Assistive device: Rolling walker (2 wheels) Gait  Pattern/deviations: Step-to pattern, Decreased stride length, Narrow base of support, Trunk flexed Gait velocity: decreased Gait velocity  interpretation: <1.31 ft/sec, indicative of household ambulator   General Gait Details: pt with continued decrease in gait veolcity but increased endurance. no rest breaks during ambulation due to fatigue or pain. HR ranging from 77-99 bpm.   Stairs Stairs: Yes Stairs assistance: Min assist Stair Management: One rail Left, Sideways Number of Stairs: 8 (4 x2) General stair comments: pt required min guard for safety and cues for hand placement on rail and L arm precautions. step to pattern.   Wheelchair Mobility    Modified Rankin (Stroke Patients Only)       Balance Overall balance assessment: Needs assistance Sitting-balance support: Feet supported Sitting balance-Leahy Scale: Fair Sitting balance - Comments: supervision for safety, no LOB with movement of bed for room set-up.   Standing balance support: Reliant on assistive device for balance Standing balance-Leahy Scale: Poor Standing balance comment: min A for safety                            Cognition Arousal/Alertness: Awake/alert Behavior During Therapy: WFL for tasks assessed/performed Overall Cognitive Status: Within Functional Limits for tasks assessed                                 General Comments: aware of deficits & L arm precautions        Exercises      General Comments General comments (skin integrity, edema, etc.): wife present during session.      Pertinent Vitals/Pain Pain Assessment Pain Assessment: Faces Faces Pain Scale: Hurts a little bit Pain Location: back Pain Descriptors / Indicators: Discomfort, Grimacing Pain Intervention(s): Premedicated before session, Monitored during session    Home Living                          Prior Function            PT Goals (current goals can now be found in the care plan section) Acute Rehab PT Goals Patient Stated Goal: return home PT Goal Formulation: With patient Time For Goal Achievement:  03/09/22 Potential to Achieve Goals: Good Progress towards PT goals: Progressing toward goals    Frequency    Min 3X/week      PT Plan Current plan remains appropriate    Co-evaluation              AM-PAC PT "6 Clicks" Mobility   Outcome Measure  Help needed turning from your back to your side while in a flat bed without using bedrails?: A Little Help needed moving from lying on your back to sitting on the side of a flat bed without using bedrails?: A Lot Help needed moving to and from a bed to a chair (including a wheelchair)?: A Lot Help needed standing up from a chair using your arms (e.g., wheelchair or bedside chair)?: A Little Help needed to walk in hospital room?: A Little Help needed climbing 3-5 steps with a railing? : A Little 6 Click Score: 16    End of Session Equipment Utilized During Treatment: Gait belt Activity Tolerance: Patient tolerated treatment well;No increased pain;Patient limited by pain Patient left: with call bell/phone within reach;with family/visitor present;in chair Nurse Communication: Mobility status PT Visit Diagnosis: Unsteadiness on feet (R26.81);Muscle weakness (generalized) (M62.81);Repeated falls (R29.6);Difficulty in  walking, not elsewhere classified (R26.2);Pain Pain - part of body:  (back)     Time: 5009-3818 PT Time Calculation (min) (ACUTE ONLY): 38 min  Charges:  $Gait Training: 8-22 mins $Therapeutic Activity: 23-37 mins                     Havery Moros, MS, [place] Acute Rehabilitation Services Office: [phone]   Havery Moros 02/26/2022, 2:00 PM

## 2022-02-27 NOTE — TOC Initial Note (Signed)
Transition of Care [facility]) - Initial/Assessment Note    Patient Details  Name: JOHNE BUCKLE MRN: [id number] Date of Birth: [birth year]  Transition of Care [facility]) CM/SW Contact:    Erenest Rasher, RN Phone Number: [phone] 02/27/2022, 12:36 PM  Clinical Narrative:                  02/27/2022 1238 pm  HF TOC CM spoke to wife [place], and offered choice for [facility]. States pt had [facility] in the past. She is requesting for [facility]. Contacted [place] rep, Amy with new referral. Verbal orders for [facility], PT with disease management received from attending. Dr Broadus John.     Expected Discharge Plan: [place] Barriers to Discharge: No Barriers Identified   Patient Goals and CMS Choice   CMS Medicare.gov Compare Post Acute Care list provided to:: Patient Represenative (must comment) Christena Deem) Choice offered to / list presented to : Spouse  Expected Discharge Plan and Services Expected Discharge Plan: [place]   Discharge Planning Services: CM Consult Post Acute Care Choice: [place] arrangements for the past 2 months: Single Family Home Expected Discharge Date: 02/26/22                         HH Arranged: RN, PT HH Agency: [place] Date [facility] Agency Contacted: 02/27/22 Time HH Agency Contacted: 61 Representative spoke with at [place]: Gevena Barre  Prior Living Arrangements/Services Living arrangements for the past 2 months: [employer] with:: Spouse Patient language and need for interpreter reviewed:: Yes Do you feel safe going back to the place where you live?: Yes            Criminal Activity/Legal Involvement Pertinent to Current Situation/Hospitalization: No - Comment as needed  Activities of Daily Living Home Assistive Devices/Equipment: CPAP, Cane (specify quad or straight) (straight) ADL Screening (condition at time of admission) Patient's cognitive ability adequate to safely complete daily  activities?: Yes Is the patient deaf or have difficulty hearing?: No Does the patient have difficulty seeing, even when wearing glasses/contacts?: No Does the patient have difficulty concentrating, remembering, or making decisions?: No Patient able to express need for assistance with ADLs?: Yes Does the patient have difficulty dressing or bathing?: No Independently performs ADLs?: Yes (appropriate for developmental age) Does the patient have difficulty walking or climbing stairs?: No Weakness of Legs: None Weakness of Arms/Hands: None  Permission Sought/Granted Permission sought to share information with : Case Manager, Family Supports, PCP                Emotional Assessment           Psych Involvement: No (comment)  Admission diagnosis:  Syncope and collapse [R55] Syncope [R55] Laceration of scalp, initial encounter [S01.01XA] Sick sinus syndrome ([place]) [I49.5] Patient Active Problem List   Diagnosis Date Noted   Hypomagnesemia 02/25/2022   Chest wall contusion 02/25/2022   Laceration of head 02/25/2022   AKI (acute kidney injury) ([place]) 02/23/2022   Hyperlipidemia 02/23/2022   PVD (peripheral vascular disease) carotid 02/23/2022   Tachy-brady syndrome ([place]) 02/23/2022   Syncope 02/22/2022   Dizzy 01/16/2022   Dizziness 01/16/2022   Dyspnea 01/01/2022   Pseudoaneurysm of left ventricle of heart 12/31/2021   Acquired aneurysm of left ventricle of heart 12/31/2021   Carotid stenosis 02/08/2021   Status post carotid endarterectomy 09/13/2020   Carotid artery stenosis, symptomatic, right 09/13/2020  Tear of lateral meniscus of knee 05/04/2020   Osteoarthritis of left knee 04/05/2020   Pain in left knee 04/03/2020   Pleural effusion 04/12/2019   Pneumothorax on left 01/10/2019   Pneumothorax, left 01/09/2019   CKD (chronic kidney disease), stage III ([place]) 01/09/2019   Acute on chronic combined systolic and diastolic CHF (congestive heart failure) ([place]) 01/09/2019    Hypothyroidism 01/09/2019   Pleural effusion on left 01/09/2019   NSTEMI (non-ST elevated myocardial infarction) ([place]) 03/55/9741   Acute diastolic CHF (congestive heart failure) ([facility])    Non-ST elevation (NSTEMI) myocardial infarction ([place]) 10/17/2018   CAP (community acquired pneumonia) 03/19/2018   Hoarseness 12/15/2013   Mild aortic stenosis 11/03/2012   Paroxysmal atrial fibrillation ([place]) 04/02/2011   Carotid stenosis, asymptomatic 04/02/2011   CAD s/p CABG 3/12  01/10/2011   Abdominal aortic aneurysm ([place]) 11/06/2010   CHEST TIGHTNESS-PRESSURE-OTHER 11/01/2010   Asthma, mild intermittent 04/10/2010   DYSPNEA 02/26/2010   OBSTRUCTIVE SLEEP APNEA 08/22/2008   Essential hypertension 08/22/2008   BARRETTS ESOPHAGUS 08/18/2008   HIATAL HERNIA 08/18/2008   DIVERTICULOSIS, COLON 08/18/2008   COLONIC POLYPS, HX OF 08/18/2008   PCP:  Shon Baton, MD Pharmacy:   [place], [place] - [address] [place] [address] Phone: [phone] Fax: [place], [place] - [address] [place] [address] Phone: [phone] Fax: [phone]     Social Determinants of Health (SDOH) Interventions    Readmission Risk Interventions     No data to display

## 2022-03-03 DIAGNOSIS — R55 Syncope and collapse: Secondary | ICD-10-CM | POA: Diagnosis not present

## 2022-03-03 DIAGNOSIS — I6529 Occlusion and stenosis of unspecified carotid artery: Secondary | ICD-10-CM | POA: Diagnosis not present

## 2022-03-03 DIAGNOSIS — N400 Enlarged prostate without lower urinary tract symptoms: Secondary | ICD-10-CM | POA: Diagnosis not present

## 2022-03-03 DIAGNOSIS — S0101XD Laceration without foreign body of scalp, subsequent encounter: Secondary | ICD-10-CM | POA: Diagnosis not present

## 2022-03-03 DIAGNOSIS — S20219D Contusion of unspecified front wall of thorax, subsequent encounter: Secondary | ICD-10-CM | POA: Diagnosis not present

## 2022-03-03 DIAGNOSIS — E039 Hypothyroidism, unspecified: Secondary | ICD-10-CM | POA: Diagnosis not present

## 2022-03-03 DIAGNOSIS — I739 Peripheral vascular disease, unspecified: Secondary | ICD-10-CM | POA: Diagnosis not present

## 2022-03-03 DIAGNOSIS — I2510 Atherosclerotic heart disease of native coronary artery without angina pectoris: Secondary | ICD-10-CM | POA: Diagnosis not present

## 2022-03-03 DIAGNOSIS — I495 Sick sinus syndrome: Secondary | ICD-10-CM | POA: Diagnosis not present

## 2022-03-03 DIAGNOSIS — I252 Old myocardial infarction: Secondary | ICD-10-CM | POA: Diagnosis not present

## 2022-03-03 DIAGNOSIS — I498 Other specified cardiac arrhythmias: Secondary | ICD-10-CM | POA: Diagnosis not present

## 2022-03-03 DIAGNOSIS — M1990 Unspecified osteoarthritis, unspecified site: Secondary | ICD-10-CM | POA: Diagnosis not present

## 2022-03-03 DIAGNOSIS — Z9989 Dependence on other enabling machines and devices: Secondary | ICD-10-CM | POA: Diagnosis not present

## 2022-03-03 DIAGNOSIS — M1380 Other specified arthritis, unspecified site: Secondary | ICD-10-CM | POA: Diagnosis not present

## 2022-03-03 DIAGNOSIS — Z951 Presence of aortocoronary bypass graft: Secondary | ICD-10-CM | POA: Diagnosis not present

## 2022-03-03 DIAGNOSIS — K219 Gastro-esophageal reflux disease without esophagitis: Secondary | ICD-10-CM | POA: Diagnosis not present

## 2022-03-03 DIAGNOSIS — N183 Chronic kidney disease, stage 3 unspecified: Secondary | ICD-10-CM | POA: Diagnosis not present

## 2022-03-03 DIAGNOSIS — Z7901 Long term (current) use of anticoagulants: Secondary | ICD-10-CM | POA: Diagnosis not present

## 2022-03-03 DIAGNOSIS — I253 Aneurysm of heart: Secondary | ICD-10-CM | POA: Diagnosis not present

## 2022-03-03 DIAGNOSIS — I5043 Acute on chronic combined systolic (congestive) and diastolic (congestive) heart failure: Secondary | ICD-10-CM | POA: Diagnosis not present

## 2022-03-03 DIAGNOSIS — I110 Hypertensive heart disease with heart failure: Secondary | ICD-10-CM | POA: Diagnosis not present

## 2022-03-03 DIAGNOSIS — E785 Hyperlipidemia, unspecified: Secondary | ICD-10-CM | POA: Diagnosis not present

## 2022-03-03 DIAGNOSIS — I480 Paroxysmal atrial fibrillation: Secondary | ICD-10-CM | POA: Diagnosis not present

## 2022-03-03 DIAGNOSIS — I714 Abdominal aortic aneurysm, without rupture, unspecified: Secondary | ICD-10-CM | POA: Diagnosis not present

## 2022-03-03 DIAGNOSIS — G4733 Obstructive sleep apnea (adult) (pediatric): Secondary | ICD-10-CM | POA: Diagnosis not present

## 2022-03-04 DIAGNOSIS — N1831 Chronic kidney disease, stage 3a: Secondary | ICD-10-CM | POA: Diagnosis not present

## 2022-03-04 DIAGNOSIS — S0003XD Contusion of scalp, subsequent encounter: Secondary | ICD-10-CM | POA: Diagnosis not present

## 2022-03-04 DIAGNOSIS — I2510 Atherosclerotic heart disease of native coronary artery without angina pectoris: Secondary | ICD-10-CM | POA: Diagnosis not present

## 2022-03-04 DIAGNOSIS — E1122 Type 2 diabetes mellitus with diabetic chronic kidney disease: Secondary | ICD-10-CM | POA: Diagnosis not present

## 2022-03-04 DIAGNOSIS — I5022 Chronic systolic (congestive) heart failure: Secondary | ICD-10-CM | POA: Diagnosis not present

## 2022-03-04 DIAGNOSIS — I480 Paroxysmal atrial fibrillation: Secondary | ICD-10-CM | POA: Diagnosis not present

## 2022-03-04 DIAGNOSIS — R55 Syncope and collapse: Secondary | ICD-10-CM | POA: Diagnosis not present

## 2022-03-04 DIAGNOSIS — Z95810 Presence of automatic (implantable) cardiac defibrillator: Secondary | ICD-10-CM | POA: Diagnosis not present

## 2022-03-05 ENCOUNTER — Telehealth: Payer: Self-pay | Admitting: Cardiology

## 2022-03-05 DIAGNOSIS — I495 Sick sinus syndrome: Secondary | ICD-10-CM | POA: Diagnosis not present

## 2022-03-05 DIAGNOSIS — I5043 Acute on chronic combined systolic (congestive) and diastolic (congestive) heart failure: Secondary | ICD-10-CM | POA: Diagnosis not present

## 2022-03-05 DIAGNOSIS — R55 Syncope and collapse: Secondary | ICD-10-CM | POA: Diagnosis not present

## 2022-03-05 DIAGNOSIS — S20219D Contusion of unspecified front wall of thorax, subsequent encounter: Secondary | ICD-10-CM | POA: Diagnosis not present

## 2022-03-05 DIAGNOSIS — I739 Peripheral vascular disease, unspecified: Secondary | ICD-10-CM | POA: Diagnosis not present

## 2022-03-05 DIAGNOSIS — S0101XD Laceration without foreign body of scalp, subsequent encounter: Secondary | ICD-10-CM | POA: Diagnosis not present

## 2022-03-05 NOTE — Telephone Encounter (Signed)
Eric Gordon from [facility] calling to get an order for 2 week 2 for PT and any restrictions and precautions for the pt.

## 2022-03-06 DIAGNOSIS — R55 Syncope and collapse: Secondary | ICD-10-CM | POA: Diagnosis not present

## 2022-03-06 DIAGNOSIS — I739 Peripheral vascular disease, unspecified: Secondary | ICD-10-CM | POA: Diagnosis not present

## 2022-03-06 DIAGNOSIS — S20219D Contusion of unspecified front wall of thorax, subsequent encounter: Secondary | ICD-10-CM | POA: Diagnosis not present

## 2022-03-06 DIAGNOSIS — I495 Sick sinus syndrome: Secondary | ICD-10-CM | POA: Diagnosis not present

## 2022-03-06 DIAGNOSIS — S0101XD Laceration without foreign body of scalp, subsequent encounter: Secondary | ICD-10-CM | POA: Diagnosis not present

## 2022-03-06 DIAGNOSIS — I5043 Acute on chronic combined systolic (congestive) and diastolic (congestive) heart failure: Secondary | ICD-10-CM | POA: Diagnosis not present

## 2022-03-07 DIAGNOSIS — S0101XD Laceration without foreign body of scalp, subsequent encounter: Secondary | ICD-10-CM | POA: Diagnosis not present

## 2022-03-07 DIAGNOSIS — I739 Peripheral vascular disease, unspecified: Secondary | ICD-10-CM | POA: Diagnosis not present

## 2022-03-07 DIAGNOSIS — S20219D Contusion of unspecified front wall of thorax, subsequent encounter: Secondary | ICD-10-CM | POA: Diagnosis not present

## 2022-03-07 DIAGNOSIS — R55 Syncope and collapse: Secondary | ICD-10-CM | POA: Diagnosis not present

## 2022-03-07 DIAGNOSIS — I495 Sick sinus syndrome: Secondary | ICD-10-CM | POA: Diagnosis not present

## 2022-03-07 DIAGNOSIS — I5043 Acute on chronic combined systolic (congestive) and diastolic (congestive) heart failure: Secondary | ICD-10-CM | POA: Diagnosis not present

## 2022-03-11 ENCOUNTER — Ambulatory Visit (HOSPITAL_COMMUNITY)
Admission: RE | Admit: 2022-03-11 | Discharge: 2022-03-11 | Disposition: A | Discharge status: Home / Self Care | Payer: Medicare Other | Source: Ambulatory Visit | Attending: Family Medicine | Admitting: Family Medicine

## 2022-03-11 ENCOUNTER — Encounter (HOSPITAL_COMMUNITY): Payer: Self-pay

## 2022-03-11 VITALS — BP 102/60 | HR 78 | Wt 202.4 lb

## 2022-03-11 DIAGNOSIS — I6522 Occlusion and stenosis of left carotid artery: Secondary | ICD-10-CM | POA: Insufficient documentation

## 2022-03-11 DIAGNOSIS — I729 Aneurysm of unspecified site: Secondary | ICD-10-CM | POA: Diagnosis not present

## 2022-03-11 DIAGNOSIS — I495 Sick sinus syndrome: Secondary | ICD-10-CM | POA: Diagnosis not present

## 2022-03-11 DIAGNOSIS — I2510 Atherosclerotic heart disease of native coronary artery without angina pectoris: Secondary | ICD-10-CM | POA: Insufficient documentation

## 2022-03-11 DIAGNOSIS — I5022 Chronic systolic (congestive) heart failure: Secondary | ICD-10-CM | POA: Insufficient documentation

## 2022-03-11 DIAGNOSIS — I350 Nonrheumatic aortic (valve) stenosis: Secondary | ICD-10-CM | POA: Diagnosis not present

## 2022-03-11 DIAGNOSIS — I714 Abdominal aortic aneurysm, without rupture, unspecified: Secondary | ICD-10-CM | POA: Diagnosis not present

## 2022-03-11 DIAGNOSIS — E785 Hyperlipidemia, unspecified: Secondary | ICD-10-CM | POA: Insufficient documentation

## 2022-03-11 DIAGNOSIS — E782 Mixed hyperlipidemia: Secondary | ICD-10-CM | POA: Diagnosis not present

## 2022-03-11 DIAGNOSIS — Z79899 Other long term (current) drug therapy: Secondary | ICD-10-CM | POA: Insufficient documentation

## 2022-03-11 DIAGNOSIS — R55 Syncope and collapse: Secondary | ICD-10-CM | POA: Diagnosis not present

## 2022-03-11 DIAGNOSIS — Z7901 Long term (current) use of anticoagulants: Secondary | ICD-10-CM | POA: Diagnosis not present

## 2022-03-11 DIAGNOSIS — I452 Bifascicular block: Secondary | ICD-10-CM | POA: Insufficient documentation

## 2022-03-11 DIAGNOSIS — Z7902 Long term (current) use of antithrombotics/antiplatelets: Secondary | ICD-10-CM | POA: Diagnosis not present

## 2022-03-11 DIAGNOSIS — I5032 Chronic diastolic (congestive) heart failure: Secondary | ICD-10-CM | POA: Diagnosis not present

## 2022-03-11 DIAGNOSIS — Z8709 Personal history of other diseases of the respiratory system: Secondary | ICD-10-CM | POA: Diagnosis not present

## 2022-03-11 DIAGNOSIS — I6523 Occlusion and stenosis of bilateral carotid arteries: Secondary | ICD-10-CM

## 2022-03-11 DIAGNOSIS — I480 Paroxysmal atrial fibrillation: Secondary | ICD-10-CM | POA: Insufficient documentation

## 2022-03-11 DIAGNOSIS — Z951 Presence of aortocoronary bypass graft: Secondary | ICD-10-CM | POA: Diagnosis not present

## 2022-03-11 DIAGNOSIS — I472 Ventricular tachycardia, unspecified: Secondary | ICD-10-CM | POA: Insufficient documentation

## 2022-03-11 LAB — BASIC METABOLIC PANEL
Anion gap: 8 (ref 5–15)
BUN: 23 mg/dL (ref 8–23)
CO2: 27 mmol/L (ref 22–32)
Calcium: 9.8 mg/dL (ref 8.9–10.3)
Chloride: 106 mmol/L (ref 98–111)
Creatinine, Ser: 1.28 mg/dL — ABNORMAL HIGH (ref 0.61–1.24)
GFR, Estimated: 54 mL/min — ABNORMAL LOW (ref >60)
Glucose, Bld: 119 mg/dL — ABNORMAL HIGH (ref 70–99)
Potassium: 4.6 mmol/L (ref 3.5–5.1)
Sodium: 141 mmol/L (ref 135–145)

## 2022-03-11 LAB — CBC
HCT: 42.5 % (ref 39.0–52.0)
Hemoglobin: 13.7 g/dL (ref 13.0–17.0)
MCH: 32.1 pg (ref 26.0–34.0)
MCHC: 32.2 g/dL (ref 30.0–36.0)
MCV: 99.5 fL (ref 80.0–100.0)
Platelets: 180 10*3/uL (ref 150–400)
RBC: 4.27 MIL/uL (ref 4.22–5.81)
RDW: 13.8 % (ref 11.5–15.5)
WBC: 8.6 10*3/uL (ref 4.0–10.5)
nRBC: 0 % (ref 0.0–0.2)

## 2022-03-11 NOTE — Patient Instructions (Signed)
It was great to see you today! No medication changes are needed at this time.  Labs today We will only contact you if something comes back abnormal or we need to make some changes. Otherwise no news is good news!  Keep follow up as scheduled with Dr McLean   Do the following things EVERYDAY: Weigh yourself in the morning before breakfast. Write it down and keep it in a log. Take your medicines as prescribed Eat low salt foods--Limit salt (sodium) to 2000 mg per day.  Stay as active as you can everyday Limit all fluids for the day to less than 2 liters  At the [facility], you and your health needs are our priority. As part of our continuing mission to provide you with exceptional heart care, we have created designated Provider Care Teams. These Care Teams include your primary Cardiologist (physician) and Advanced Practice Providers (APPs- Physician Assistants and Nurse Practitioners) who all work together to provide you with the care you need, when you need it.   You may see any of the following providers on your designated Care Team at your next follow up: Dr Daniel Bensimhon Dr Dalton McLean Amy Clegg, NP Brittainy Simmons, PA Jessica Milford,NP Lindsay Finch, PA Lauren Kemp, PharmD   Please be sure to bring in all your medications bottles to every appointment.   If you have any questions or concerns before your next appointment please send us a message through mychart or call our office at [phone].    TO LEAVE A MESSAGE FOR THE NURSE SELECT OPTION 2, PLEASE LEAVE A MESSAGE INCLUDING: YOUR NAME DATE OF BIRTH CALL BACK NUMBER REASON FOR CALL**this is important as we prioritize the call backs  YOU WILL RECEIVE A CALL BACK THE SAME DAY AS LONG AS YOU CALL BEFORE 4:00 PM   

## 2022-03-11 NOTE — Progress Notes (Signed)
[facility] Note  Date:  03/11/2022   ID:  Eric Gordon, DOB Jan 09, 1931, MRN [id number]   Provider location: [facility] Type of Visit: Established patient  PCP:  Shon Baton, MD  HF Cardiologist:  Dr. Aundra Dubin   HPI: Eric Gordon is a 88 y.o. male with history of CAD s/p CABG.  He has had trouble long-term with exertional dyspnea.  Dyspnea triggered evaluation in 2012 leading to CABG but was not resolved by CABG.  PFTs in 3/14 showed only mild obstructive airways disease and V/Q scan showed no PE.  Echo in 9/14 showed normal LV systolic function with moderately dilated RV. [name] in 2/15 showed normal right and left heart filling pressures and normal PA pressure.  Finally, he had a CPX in 3/15 that showed normal capacity compared to age-matched sedentary norms.  He was noted to have chronotropic incompetence.  At a prior appointment, I took him off metoprolol given chronotropic incompetence noted on CPX.  Dyspnea improved significantly with weight loss.  He had Cardiolite in 8/18 with EF 52%, no ischemia or infarction.    He was admitted in 1/20 with NSTEMI.  LHC showed new occlusion of the branch of SVG-ramus and OM that touched down on OM.  There were also serial 70% stenoses in the mid/distal RCA.  There was not thought to be an interventional option and patient was treated medically. He was discharged home but presented a couple days later with recurrent chest pain and dyspnea.  Cath was repeated showing no change from prior.  This admission, he had FFR of the RCA which did not suggest hemodynamic significance.  Echo showed EF 55-60%, normal RV.  CTA did not show a PE. He was noted to be volume overloaded and was diuresed.  He was also noted to be in atrial fibrillation with RVR transiently.  ASA/Plavix was stopped and Eliquis was started. He was discharged to SNF.  Atrial fibrillation recurred and he was started on amiodarone with conversion  back to NSR.   In 4/20, he was admitted with left spontaneous PTX requiring chest tube.  After this, he developed recurrent left pleural effusions requiring thoracentesis x 3 so far.  Pleural fluid was exudative with eosinophil predominance.  With elevated ESR, there was concern that amiodarone could be involved, so this medication was stopped.  CT chest in 6/20 showed LLL consolidation/collapse with small left effusion, there was a rim-enhancing lesion posterior to the heart between esophagus and left atrium, ?loculated fluid.  TEE was done in 7/20 and confirmed loculated pleural effusion behind the left atrium.  In 7/20, patient had VATS on the left.   Carotid dopplers in 9/21 showed 80-99% RICA stenosis, 78-29% LICA.  AAA [place] in 9/21 showed 4.5 cm AAA. Patient was referred to Dr. Trula Slade for evaluation => he has had TCAR on right in 12/21.  He had left TCAR in 5/22.   Cardiolite in 12/21 with EF 41%, no ischemia, fixed inferolateral defect. Echo in 1/22 showed EF 40-45%, basal to mid inferolateral AK, basal inferior HK, normal RV, mild aortic stenosis mean gradient 11 mmHg.  He was unable to tolerate dapagliflozin and is off it. He was lightheaded on low dose Entresto so this was stopped.   Zio monitor in 1/23 showed 25% atrial flutter.  He was referred to EP, by the time he saw EP, burden of atrial fibrillation was lower.   Cardiolite in 3/23 showed prior inferolateral and  inferior infarct, no ischemia.   Patient was admitted in 4/23 with atypical chest pain.  This likely was due to cough/bronchitis.  CTA chest showed inferolateral wall pseudoaneurysm, this is chronic.  Echo in 4/23 showed EF 40-45%, akinesis of the inferolateral wall, basal inferolateral pseudoaneurysm, moderate RV enlargement, normal RV systolic function, mild AS with mean gradient 10 mmHg.   Zio monitor in 5/23 showed 2% AF burden, improved from prior.     Follow up 5/23, stable NYHA II, euvolemic. Off several meds due to  orthostatic symptoms.  Admitted 6/23 with syncope. 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 EP study with inducible VT. Underwent dual chamber Biotronik ICD placement. Discharged home, weight 208 lbs.  Today he returns for post hospital HF follow up with his wife. Overall feeling fine. Has some incisional soreness from ICD placement. He is short of breath walking on flat ground, but does OK if he takes his time. No further falls. Using a cane. Denies palpitations, abnormal  bleeding, CP, dizziness, edema, or PND/Orthopnea. Appetite ok. No fever or chills. Weight at home 198 pounds. Taking all medications. Working with [facility] PT but does not feel it is adding benefit. He has not needed Lasix recently.  ECG (personally reviewed): a-paced, rBBB, QRS 166 msec   Labs (2/15): K 4.2, creatinine 1.2, LDL 87, HDL 39, BNP 71 Labs (4/15): K 3.9, creatinine 1.1 Labs (12/15): LDL 73, HDL 37, K 3.9, creatinine 1.0 Labs (12/16): LDL 71, HDL 49 Labs (3/17): K 4.2, creatinine 1.28, HCT 45 Labs (8/18): LDL 82, HDL 41, TSH normal, hgb 16.3, K 4.1, creatinine 1.05 Labs (2/20): LFTs normal, pro BNP 842 Labs (3/20): K 3.8, creatinine 1.48, TSH mildly elevated at 6.97, free T3 and free T4 normal Labs (4/20): K 3.9, creatinine 1.36 Labs (2/20): LDL 52 Labs (8/20): BNP 241, creatinine 1.21  Labs (9/20): BNP 244, K 4, creatinine 1.12 Labs (2/21): K 4.3, creatinine 1.26, BNP 271 Labs (6/21): K 4.2, creatinine 1.11, LDL 57 Labs (6/22): K 4.3, creatinine 1.16, LDL 49 Labs (8/22): K 4.3, creatinine 1.37 Labs (12/22): K 3.9, creatinine 1.18 Labs (4/23): K 4.7, creatinine 1.35, BNP 253 Labs (6/23): K 4.1, creatinine 0.95   PMH: 1. Essential tremor 2. CAD: s/p CABG in 3/12.   - Cardiolite (8/18): EF 52%, no ischemia/infarction.  - NSTEMI (1/20):  LHC showed new occlusion of the branch of SVG-ramus and OM that touched down on OM.  There were also serial 70% stenoses in the  mid/distal RCA.  FFR of RCA was negative.  - Cardiolite (12/21): EF 41%, no ischemia, fixed inferolateral defect - Cardiolite (3/23): LV EF 37%, fixed basal-mid inferolateral/inferior defect, no ischemia.  3. Atrial fibrillation: Only noted post-op CABG.  4. PE: Post-op CABG in 2012.  5. AAA: 3.6 cm on [place] in 3/14.  3.6 cm on [place] in 3/15. 3.6 cm on [place] 8/16. 4.1 cm on [place] 4/17.  - AAA [place] (5/18): 4.1 cm AAA.  - AAA [place] (5/19): 4.1 cm AAA.  - AAA [place] (4/20): 4.1 cm AAA.  - AAA [place] (9/21): 4.5 cm AAA - AAA [place] (9/22): 4.5 cm AAA 6. OSA: On CPAP.  7. Carotid stenosis: Carotid dopplers (4/68) with 03-21% LICA stenosis.  Carotid dopplers (2/24) with 82-50% LICA stenosis. Carotid dopplers (3/16) with 60-79% RCIA stenosis, 03-70% LICA stenosis.  - Carotid dopplers (8/16) with 40-59% RICA stenosis, 48-88% LICA stenosis.  - Carotid dopplers (8/17) with 91-69% RICA, 45-03% LICA. -  Carotid dopplers (9/18) with 47-09% RICA, 62-83% LICA.   - Carotid dopplers (9/19) with 66-29% RICA, 47-65% LICA.  - Carotid dopplers (9/20) with 60-79% BICA - Carotid dopplers (9/21): 46-50% RICA, 35-46% LICA - Right TCAR 56/81, left TCAR 5/22.  - Carotid dopplers (4/23): patent ICA stents.  8. Asthma 9. PNA x 2 10. Chronic diastolic CHF/dyspnea: Echo (9/14) with EF 60-65%, mild LVH, very mild AS with mean gradient 10 mmHg, RV moderately dilated.  PFTs (3/14) showed mild obstructive airways disease.  V/Q scan (3/14) with no PE.  ENT workup for upper respiratory causes was negative.  RHC (2/15) with mean RA 7, PA 32/12, mean PCWP 13, CI 3.18.  CPX (3/15) with peak VO2 16.4, VE/VCO2 33; normal when compared to age-matched sedentary normals; chronotropic incompetence was noted.  Dyspnea was improved with weight loss.  - Echo (8/17): EF 60-65%, mild AS.  - Echo (1/20): EF 55-60%, normal RV size and systolic function.  - Echo (6/20): EF 55%, mild LVH, mild AS, normal RV size and systolic function, ?LA mass or mass impinging on posterior  LA.  - TEE (7/20): EF 50-55%, mild LVH, basal inferolateral aneurysm, normal RV size/systolic function, loculated pleural fluid behind LA.  - Echo (1/22): EF 40-45%, basal to mid inferolateral AK, basal inferior HK, normal RV, mild aortic stenosis mean gradient 11 mmHg. - Echo (2/23): EF 45-50%, moderate LVH, normal RV, mild AS mean gradient 15 mmHg with IVC normal.  - Echo (4/23): EF 40-45%, akinesis of the inferolateral wall, basal inferolateral pseudoaneurysm, moderate RV enlargement, normal RV systolic function, mild AS with mean gradient 10 mmHg.  - Echo (6/23): EF 40-45% and pre-existing aneurysm of the basal inferolateral wall, mild-mod AS. 11. HTN 12. Aortic stenosis: Mild.  13. Ascending aortic aneurysm: CTA chest with 4.4 cm ascending aorta in 1/20.  - 4.2 cm ascending aorta on CT chest 6/20.  14. Atrial fibrillation: Paroxysmal.  15. CKD: Stage 3.  16. Left lung spontaneous PTX then recurrent left pleural effusion.  - Left VATS in 7/20.  17. Palpitations:  - Zio patch (6/20): NSR with 1 short NSVT run and few short SVT runs, no atrial fibrillation.  - Zio patch 2 wks (2/21): 6 short NSVT runs, 66 short SVT runs, rare PACs/PVCs.  - Zio patch (5/23): 2% AF burden, 4 NSVT runs longest 5 beats.  56. COVID-19 infection 4/22.  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  20. VT: s/p dual chamber Biotronik ICD  Current Outpatient Medications  Medication Sig Dispense Refill   acetaminophen (TYLENOL) 500 MG tablet Take 1,000 mg by mouth every 6 (six) hours as needed for moderate pain or headache.     apixaban (ELIQUIS) 5 MG TABS tablet Take 1 tablet (5 mg total) by mouth 2 (two) times daily. Restart after 5 days on 6/12     ASCORBIC ACID PO Take 500 mg by mouth daily.     atorvastatin (LIPITOR) 80 MG tablet Take 1 tablet (80 mg total) by mouth daily. 30 tablet 6   Carboxymethylcellulose Sod PF 0.5 % SOLN Place 1 drop into both eyes See admin instructions. Instill 1 drop into both eyes  two to four times a day as needed for dryness     Cholecalciferol (VITAMIN D-3) 25 MCG (1000 UT) CAPS Take 1,000 Units by mouth daily.     fexofenadine (ALLEGRA) 180 MG tablet Take 180 mg by mouth daily.     furosemide (LASIX) 40 MG tablet Take 0.5 tablets (20 mg total) by mouth  as needed. 30 tablet 3   isosorbide mononitrate (IMDUR) 30 MG 24 hr tablet Take 1 tablet (30 mg total) by mouth daily.     levothyroxine (SYNTHROID) 50 MCG tablet Take 50 mcg by mouth daily before breakfast.     Multiple Vitamins-Minerals (CENTRUM SILVER PO) Take 1 tablet by mouth See admin instructions. Take 1 tablet by mouth every other day with breakfast     pantoprazole (PROTONIX) 40 MG tablet Take 40 mg by mouth daily before breakfast.     spironolactone (ALDACTONE) 25 MG tablet Take 1 tablet (25 mg total) by mouth daily. 90 tablet 3   tamsulosin (FLOMAX) 0.4 MG CAPS capsule Take 0.4 mg by mouth every evening.     traMADol (ULTRAM) 50 MG tablet Take 1 tablet (50 mg total) by mouth every 6 (six) hours as needed for moderate pain. 20 tablet 0   valsartan (DIOVAN) 40 MG tablet Take 0.5 tablets (20 mg total) by mouth 2 (two) times daily. 30 tablet 3   No current facility-administered medications for this encounter.    Allergies:   Ancef [cefazolin sodium], Empagliflozin, and Vancomycin   Social History:  The patient  reports that he quit smoking about 38 years ago. His smoking use included cigarettes. He has a 35.00 pack-year smoking history. He has never used smokeless tobacco. He reports current alcohol use of about 2.0 standard drinks of alcohol per week. He reports that he does not use drugs.   Family History:  The patient's family history includes Coronary artery disease in his mother; Heart disease in his mother; Hypertension in his mother.   ROS:  Please see the history of present illness.   All other systems are personally reviewed and negative.   Recent Labs: 01/16/2022: B Natriuretic Peptide  253.2 02/23/2022: ALT 15; TSH 2.442 02/26/2022: BUN 17; Creatinine, Ser 0.95; Hemoglobin 12.8; Magnesium 2.2; Platelets 129; Potassium 4.1; Sodium 135  Personally reviewed   BP 102/60 (BP Location: Left Arm)   Pulse 78   Wt 91.8 kg (202 lb 6.4 oz)   SpO2 97%   BMI 24.96 kg/m   Wt Readings from Last 3 Encounters:  03/11/22 91.8 kg (202 lb 6.4 oz)  02/26/22 94.7 kg (208 lb 12.4 oz)  02/18/22 95.7 kg (211 lb)    Physical Exam:   General:  NAD. No resp difficulty HEENT: Normal Neck: Supple. No JVD. Carotids 2+ bilat; no bruits. No lymphadenopathy or thryomegaly appreciated. Cor: PMI nondisplaced. Regular rate & rhythm. No rubs, gallops, 2/6 SEM RUSB Lungs: Clear, ICD pocket looks good, steristrips in place. Abdomen: Soft, nontender, nondistended. No hepatosplenomegaly. No bruits or masses. Good bowel sounds. Extremities: No cyanosis, clubbing, rash, edema Neuro: Alert & oriented x 3, cranial nerves grossly intact. Moves all 4 extremities w/o difficulty. Affect pleasant.  Assessment & Plan: 1. Syncope: most likely cardiogenic. Known tachybradycardia syndrome with recent outpatient monitor showing postconversion symptomatic pauses, up to 3 sec long. Has bifascicular block.  Echo (6/23) EF 40-45% and pre-existing aneurysm of the basal inferolateral wall, mild-mod AS. EP study this admit w/ inducible VT. No further syncope. - S/p Biotronik ICD 02/25/22. - No further events. 2. CAD: s/p CABG 3/12. NSTEMI 1/20, LHC showed new occlusion of the branch of SVG-ramus and OM that touched down on OM.  There were also serial 70% stenoses in the mid/distal RCA.  FFR of RCA was negative.  No interventional target.  Cardiolite in 12/21 showed inferolateral infarction with no ischemia, consistent with findings on NSTEMI in  1/20.  Cardiolite in 3/23 showed inferior/inferolateral fixed defect with no ischemia.  No recent ischemic CP . - No ASA given Eliquis use.  - Continue atorvastatin 80 daily.   - Off  ranolazine with prolonged QTc.  3. Hyperlipidemia: Continue statin. 4. Chronic HF with mid range EF: TEE in 7/20 showed EF 50-55%, basal inferolateral aneurysm, and normal RV.  Echo in 1/22 with EF 40-45%, basal-mid inferolateral akinesis and inferior hypokinesis, normal RV.  Echo was stable in 4/23 with EF 40-45%, akinesis of the inferolateral wall, basal inferolateral pseudoaneurysm, moderate RV enlargement, normal RV systolic function, mild AS with mean gradient 10 mmHg.  NYHA class II, I think that his dyspnea is primarily related to his left lung disease (mild and stable).  He is not volume overloaded today. - Coreg had to be stopped due to orthostasis.  - Continue valsartan 20 mg bid (no Entresto due to lightheaded/hypotensive). - Continue Lasix 20 mg PRN. - Continue spironolactone 25 mg daily.   - Unable to tolerate dapaglifozin. 5. Spontaneous left PTX with recurrent left-sided pleural effusion:  He is s/p left VATS/pleurodesis in 7/20.    6. Carotid stenosis: S/p bilateral TCARs, followed at [facility].     7. AAA: 4.5 cm AAA in 9/22, plan repeat AAA [place].   8. Aortic stenosis: mild-mod on echo 6/23. 9. Atrial fibrillation: Paroxysmal. He is significantly symptomatic while in atrial fibrillation. He is off amiodarone due to concern for pulmonary toxicity.  He saw EP, did not recommend ablation. He would likely be a Tikosyn or sotalol candidate but would have to come into the hospital for it.  As he has had minimal atrial fibrillation recently, hold off Tikosyn/sotalol for now. NSR today.  - Continue Eliquis 5 mg bid. No bleeding issues. CBC today. 10. Pseudoaneurysm: Chronic PSA basal inferolateral wall.  This has been stable, not candidate for surgery.  11. VT: EP study this admit w/ inducible VT. - s/p Dual chamber ICD 02/25/22.  - No driving x 6 months.   Follow up in 3 months with Dr. Aundra Dubin, as scheduled.    Allena Katz, FNP-BC 03/11/22

## 2022-03-12 ENCOUNTER — Ambulatory Visit (INDEPENDENT_AMBULATORY_CARE_PROVIDER_SITE_OTHER): Payer: Medicare Other

## 2022-03-12 DIAGNOSIS — I495 Sick sinus syndrome: Secondary | ICD-10-CM | POA: Diagnosis not present

## 2022-03-12 LAB — CUP PACEART INCLINIC DEVICE CHECK
Date Time Interrogation Session: 20230621111635
Implantable Lead Implant Date: 20230606
Implantable Lead Implant Date: 20230606
Implantable Lead Location: 753859
Implantable Lead Location: 753860
Implantable Lead Model: 377
Implantable Lead Model: 402266
Implantable Lead Serial Number: 8000807784
Implantable Lead Serial Number: 81503989
Implantable Pulse Generator Implant Date: 20230606
Pulse Gen Model: 429534
Pulse Gen Serial Number: 84900693

## 2022-03-12 NOTE — Patient Instructions (Signed)
   After Your ICD (Implantable Cardiac Defibrillator)    Monitor your defibrillator site for redness, swelling, and drainage. Call the device clinic at [phone] if you experience these symptoms or fever/chills.  Your incision was closed with Steri-strips or staples:  You may shower 7 days after your procedure and wash your incision with soap and water. Avoid lotions, ointments, or perfumes over your incision until it is well-healed.    You may use a hot tub or a pool after your wound check appointment if the incision is completely closed.  Do not lift, push or pull greater than 10 pounds with the affected arm until 6 weeks after your procedure. There are no other restrictions in arm movement after your wound check appointment.  Your ICD is MRI compatible.  Your ICD is designed to protect you from life threatening heart rhythms. Because of this, you may receive a shock.   1 shock with no symptoms:  Call the office during business hours. 1 shock with symptoms (chest pain, chest pressure, dizziness, lightheadedness, shortness of breath, overall feeling unwell):  Call 911. If you experience 2 or more shocks in 24 hours:  Call 911. If you receive a shock, you should not drive.  [place] DMV - no driving for 6 months if you receive appropriate therapy from your ICD.   ICD Alerts:  Some alerts are vibratory and others beep. These are NOT emergencies. Please call our office to let us know. If this occurs at night or on weekends, it can wait until the next business day. Send a remote transmission.  If your device is capable of reading fluid status (for heart failure), you will be offered monthly monitoring to review this with you.   Remote monitoring is used to monitor your ICD from home. This monitoring is scheduled every 91 days by our office. It allows us to keep an eye on the functioning of your device to ensure it is working properly. You will routinely see your Electrophysiologist annually  (more often if necessary).   

## 2022-03-12 NOTE — Progress Notes (Signed)
Wound check appointment. Steri-strips removed. Wound without redness or edema. Incision edges approximated, wound well healed. Normal device function. Thresholds, sensing, and impedances consistent with implant measurements. Device programmed at 3.5V for extra safety margin until 3 month visit. Histogram distribution appropriate for patient and level of activity. AT/AF burden 0.2%.  See report for details. Patient educated about wound care, arm mobility, lifting restrictions, shock plan. ROV in 3 months with implanting physician.

## 2022-04-22 ENCOUNTER — Telehealth: Payer: Self-pay | Admitting: Cardiology

## 2022-04-22 ENCOUNTER — Ambulatory Visit (INDEPENDENT_AMBULATORY_CARE_PROVIDER_SITE_OTHER): Payer: Medicare Other

## 2022-04-22 MED ORDER — CLINDAMYCIN HCL 300 MG PO CAPS: 300.0000 mg | ORAL_CAPSULE | Freq: Two times a day (BID) | ORAL | 0 refills | Status: AC

## 2022-04-22 NOTE — Progress Notes (Signed)
Patient present to device clinic to have wound assessed. S/P ICD implant 02/25/22 by CL. Dr. Lovena Le in to assess. He was able to express minimal amt of thick purulent drainage from dime sized "blister looking" area just beneath the incision line. Covered with bandaid and care instructions provided. Patient prescribed clindamycin 300,g BID x 5 days. He will return for wound check and assessment by Dr. Quentin Ore 04/28/22 at 11:00 am.

## 2022-04-22 NOTE — Patient Instructions (Addendum)
   After Your ICD (Implantable Cardiac Defibrillator)    Monitor your defibrillator site for redness, swelling, and drainage. Call the device clinic at [phone] if you experience these symptoms or fever/chills.  Please leave bandaid intact overnight. Tomorrow you may begin to shower using a clean washcloth and towel with each cleansing. Use warm soapy water and wash gently. Pat dry and leave open to air. Take all antibiotics as prescribed. Please return to your wound check appointment 04/28/22 at 11:00 am

## 2022-04-22 NOTE — Telephone Encounter (Signed)
Successful telephone encounter to patient to discuss ICD implant site drainage. Patient is scheduled for wound check today at 1:00 pm.

## 2022-04-22 NOTE — Telephone Encounter (Signed)
Patient had ICD insertion wound check on 03/12/22. Was reported good wound edge approximation, healed and w/o redness at site.   [name] states he noted 2-3 days ago some minor swelling over suture line site. He notice 2 drops of black material on his shirt today. He claims the wound edges are closed but a 1 inch area appears red and raised.    I will forward to [facility] device pool

## 2022-04-22 NOTE — Telephone Encounter (Signed)
Patient called to talk with Dr. Quentin Ore or nurse in regards to a growth that is on top of procedure that was done. Please call back

## 2022-04-28 ENCOUNTER — Ambulatory Visit (INDEPENDENT_AMBULATORY_CARE_PROVIDER_SITE_OTHER): Payer: Medicare Other

## 2022-04-28 DIAGNOSIS — I495 Sick sinus syndrome: Secondary | ICD-10-CM

## 2022-04-28 NOTE — Patient Instructions (Signed)
   After Your ICD (Implantable Cardiac Defibrillator)    Monitor your defibrillator site for redness, swelling, and drainage. Call the device clinic at [phone] if you experience these symptoms or fever/chills.  Please continue to wash area with warm soapy water and a clean wash cloth daily.  Leave dry and open to air.   Do NOT apply any ointments, creams, powders or lotions to area.

## 2022-04-28 NOTE — Progress Notes (Signed)
Patient seen in device clinic and wound appears to have healed well.  Dr. Quentin Ore in to view and no further abx needed. Wound care education provided. Wife seemed concerned if it would heal further or if patient needed further abx. Dr. Quentin Ore confirmed he did not but will be happy to see back in roughly 1 week. DC apt made next week.    Spoke to [employer] and confirmed patient ID card for ICD is still waiting to be mailed.   Card will be mailed out early part of next week, taken roughly 1.5 weeks to mail.   Attempted to call patient to advise. No answer, left detailed message on home phone as advised per DPR.

## 2022-05-08 ENCOUNTER — Ambulatory Visit: Payer: Medicare Other

## 2022-05-08 ENCOUNTER — Ambulatory Visit (INDEPENDENT_AMBULATORY_CARE_PROVIDER_SITE_OTHER): Payer: Medicare Other

## 2022-05-08 DIAGNOSIS — I495 Sick sinus syndrome: Secondary | ICD-10-CM

## 2022-05-08 MED ORDER — DOXYCYCLINE HYCLATE 50 MG PO CAPS: 100.0000 mg | ORAL_CAPSULE | Freq: Two times a day (BID) | ORAL | 0 refills | Status: AC

## 2022-05-08 NOTE — Progress Notes (Signed)
Wound re-check see image in media uploaded by Oda Kilts PA , patient prescribed Doxycycline BID return for wound re-check on 05/14/2022 with CL in office.

## 2022-05-08 NOTE — Patient Instructions (Signed)
Keep incision site clean and dry shower as normal, wear clean clothes everyday, dry with clean towel every shower, wear clean pajamas to bed.   Take probiotic with antibiotic

## 2022-05-14 ENCOUNTER — Ambulatory Visit: Payer: Medicare Other

## 2022-05-14 DIAGNOSIS — I495 Sick sinus syndrome: Secondary | ICD-10-CM

## 2022-05-14 NOTE — Progress Notes (Signed)
Wound re-check, right lateral corner of incision site, less red, small pimple whitehead area noted at center of device site, site assessed by Dr. Quentin Ore recommended patient finish anti-biotic course and keep 91day f/u as scheduled on 06/02/2022

## 2022-05-23 ENCOUNTER — Ambulatory Visit (HOSPITAL_COMMUNITY)
Admission: RE | Admit: 2022-05-23 | Discharge: 2022-05-23 | Disposition: A | Discharge status: Home / Self Care | Payer: Medicare Other | Source: Ambulatory Visit | Attending: Cardiology | Admitting: Cardiology

## 2022-05-23 ENCOUNTER — Encounter (HOSPITAL_COMMUNITY): Payer: Self-pay | Admitting: Cardiology

## 2022-05-23 VITALS — BP 110/70 | HR 70 | Wt 205.6 lb

## 2022-05-23 DIAGNOSIS — I714 Abdominal aortic aneurysm, without rupture, unspecified: Secondary | ICD-10-CM | POA: Diagnosis not present

## 2022-05-23 DIAGNOSIS — E785 Hyperlipidemia, unspecified: Secondary | ICD-10-CM | POA: Diagnosis not present

## 2022-05-23 DIAGNOSIS — R55 Syncope and collapse: Secondary | ICD-10-CM | POA: Diagnosis not present

## 2022-05-23 DIAGNOSIS — I350 Nonrheumatic aortic (valve) stenosis: Secondary | ICD-10-CM | POA: Diagnosis not present

## 2022-05-23 DIAGNOSIS — I480 Paroxysmal atrial fibrillation: Secondary | ICD-10-CM | POA: Diagnosis not present

## 2022-05-23 DIAGNOSIS — I495 Sick sinus syndrome: Secondary | ICD-10-CM | POA: Insufficient documentation

## 2022-05-23 DIAGNOSIS — I252 Old myocardial infarction: Secondary | ICD-10-CM | POA: Insufficient documentation

## 2022-05-23 DIAGNOSIS — Z951 Presence of aortocoronary bypass graft: Secondary | ICD-10-CM | POA: Diagnosis not present

## 2022-05-23 DIAGNOSIS — Z7901 Long term (current) use of anticoagulants: Secondary | ICD-10-CM | POA: Insufficient documentation

## 2022-05-23 DIAGNOSIS — I959 Hypotension, unspecified: Secondary | ICD-10-CM | POA: Diagnosis not present

## 2022-05-23 DIAGNOSIS — I5032 Chronic diastolic (congestive) heart failure: Secondary | ICD-10-CM | POA: Insufficient documentation

## 2022-05-23 DIAGNOSIS — Z79899 Other long term (current) drug therapy: Secondary | ICD-10-CM | POA: Diagnosis not present

## 2022-05-23 DIAGNOSIS — R42 Dizziness and giddiness: Secondary | ICD-10-CM | POA: Insufficient documentation

## 2022-05-23 DIAGNOSIS — J984 Other disorders of lung: Secondary | ICD-10-CM | POA: Insufficient documentation

## 2022-05-23 DIAGNOSIS — I2510 Atherosclerotic heart disease of native coronary artery without angina pectoris: Secondary | ICD-10-CM | POA: Insufficient documentation

## 2022-05-23 LAB — BASIC METABOLIC PANEL
Anion gap: 4 — ABNORMAL LOW (ref 5–15)
BUN: 24 mg/dL — ABNORMAL HIGH (ref 8–23)
CO2: 30 mmol/L (ref 22–32)
Calcium: 9.7 mg/dL (ref 8.9–10.3)
Chloride: 107 mmol/L (ref 98–111)
Creatinine, Ser: 1.29 mg/dL — ABNORMAL HIGH (ref 0.61–1.24)
GFR, Estimated: 53 mL/min — ABNORMAL LOW (ref >60)
Glucose, Bld: 96 mg/dL (ref 70–99)
Potassium: 4.7 mmol/L (ref 3.5–5.1)
Sodium: 141 mmol/L (ref 135–145)

## 2022-05-23 LAB — CBC
HCT: 42.5 % (ref 39.0–52.0)
Hemoglobin: 13.7 g/dL (ref 13.0–17.0)
MCH: 31.5 pg (ref 26.0–34.0)
MCHC: 32.2 g/dL (ref 30.0–36.0)
MCV: 97.7 fL (ref 80.0–100.0)
Platelets: 148 10*3/uL — ABNORMAL LOW (ref 150–400)
RBC: 4.35 MIL/uL (ref 4.22–5.81)
RDW: 13.5 % (ref 11.5–15.5)
WBC: 6.8 10*3/uL (ref 4.0–10.5)
nRBC: 0 % (ref 0.0–0.2)

## 2022-05-23 LAB — BRAIN NATRIURETIC PEPTIDE: B Natriuretic Peptide: 254.9 pg/mL — ABNORMAL HIGH (ref 0.0–100.0)

## 2022-05-23 MED ORDER — SPIRONOLACTONE 25 MG PO TABS: 25.0000 mg | ORAL_TABLET | Freq: Every evening | ORAL | 3 refills | Status: DC

## 2022-05-23 NOTE — Patient Instructions (Signed)
Change Spironolactone to nightly  Labs done today, your results will be available in MyChart, we will contact you for abnormal readings.  Please wear compression stockings.  Your physician recommends that you schedule a follow-up appointment in: 4 months ( January 2024) **please call the office in November to arrange your follow up appointment **  If you have any questions or concerns before your next appointment please send [place] a message through [place] or call our office at [phone].    TO LEAVE A MESSAGE FOR THE NURSE SELECT OPTION 2, PLEASE LEAVE A MESSAGE INCLUDING: YOUR NAME DATE OF BIRTH CALL BACK NUMBER REASON FOR CALL**this is important as we prioritize the call backs  YOU WILL RECEIVE A CALL BACK THE SAME DAY AS LONG AS YOU CALL BEFORE 4:00 PM  At the [facility], you and your health needs are our priority. As part of our continuing mission to provide you with exceptional heart care, we have created designated Provider Care Teams. These Care Teams include your primary Cardiologist (physician) and Advanced Practice Providers (APPs- Physician Assistants and Nurse Practitioners) who all work together to provide you with the care you need, when you need it.   You may see any of the following providers on your designated Care Team at your next follow up: Dr Glori Bickers Dr [name] Dr. Roxana Hires, NP Lyda Jester, [facility] [place], [place] Forestine Na, NP Audry Riles, PharmD   Please be sure to bring in all your medications bottles to every appointment.

## 2022-05-25 NOTE — Progress Notes (Signed)
Advanced Heart Failure Clinic Note  Date:  05/25/2022   ID:  KORRY DALGLEISH, DOB 05-27-1931, MRN [id number]   Provider location: [place] [facility] Type of Visit: Established patient  PCP:  Shon Baton, MD  HF Cardiologist:  Dr. Aundra Dubin   HPI: SEANPATRICK MAISANO is a 88 y.o. male with history of CAD s/p CABG.  He has had trouble long-term with exertional dyspnea.  Dyspnea triggered evaluation in 2012 leading to CABG but was not resolved by CABG.  PFTs in 3/14 showed only mild obstructive airways disease and V/Q scan showed no PE.  Echo in 9/14 showed normal LV systolic function with moderately dilated RV. [place] in 2/15 showed normal right and left heart filling pressures and normal PA pressure.  Finally, he had a CPX in 3/15 that showed normal capacity compared to age-matched sedentary norms.  He was noted to have chronotropic incompetence.  At a prior appointment, I took him off metoprolol given chronotropic incompetence noted on CPX.  Dyspnea improved significantly with weight loss.  He had Cardiolite in 8/18 with EF 52%, no ischemia or infarction.    He was admitted in 1/20 with NSTEMI.  LHC showed new occlusion of the branch of SVG-ramus and OM that touched down on OM.  There were also serial 70% stenoses in the mid/distal RCA.  There was not thought to be an interventional option and patient was treated medically. He was discharged home but presented a couple days later with recurrent chest pain and dyspnea.  Cath was repeated showing no change from prior.  This admission, he had FFR of the RCA which did not suggest hemodynamic significance.  Echo showed EF 55-60%, normal RV.  CTA did not show a PE. He was noted to be volume overloaded and was diuresed.  He was also noted to be in atrial fibrillation with RVR transiently.  ASA/Plavix was stopped and Eliquis was started. He was discharged to SNF.  Atrial fibrillation recurred and he was started on amiodarone with conversion  back to NSR.   In 4/20, he was admitted with left spontaneous PTX requiring chest tube.  After this, he developed recurrent left pleural effusions requiring thoracentesis x 3 so far.  Pleural fluid was exudative with eosinophil predominance.  With elevated ESR, there was concern that amiodarone could be involved, so this medication was stopped.  CT chest in 6/20 showed LLL consolidation/collapse with small left effusion, there was a rim-enhancing lesion posterior to the heart between esophagus and left atrium, ?loculated fluid.  TEE was done in 7/20 and confirmed loculated pleural effusion behind the left atrium.  In 7/20, patient had VATS on the left.   Carotid dopplers in 9/21 showed 80-99% RICA stenosis, 11-65% LICA.  AAA [place] in 9/21 showed 4.5 cm AAA. Patient was referred to Dr. Trula Slade for evaluation => he has had TCAR on right in 12/21.  He had left TCAR in 5/22.   Cardiolite in 12/21 with EF 41%, no ischemia, fixed inferolateral defect. Echo in 1/22 showed EF 40-45%, basal to mid inferolateral AK, basal inferior HK, normal RV, mild aortic stenosis mean gradient 11 mmHg.  He was unable to tolerate dapagliflozin and is off it. He was lightheaded on low dose Entresto so this was stopped.   Zio monitor in 1/23 showed 25% atrial flutter.  He was referred to EP, by the time he saw EP, burden of atrial fibrillation was lower.   Cardiolite in 3/23 showed prior inferolateral and  inferior infarct, no ischemia.   Patient was admitted in 4/23 with atypical chest pain.  This likely was due to cough/bronchitis.  CTA chest showed inferolateral wall pseudoaneurysm, this is chronic.  Echo in 4/23 showed EF 40-45%, akinesis of the inferolateral wall, basal inferolateral pseudoaneurysm, moderate RV enlargement, normal RV systolic function, mild AS with mean gradient 10 mmHg.   Zio monitor in 5/23 showed 2% AF burden, improved from prior.     Follow up 5/23, stable NYHA II, euvolemic. Off several meds due to  orthostatic symptoms.  Admitted 6/23 with syncope, no evidence for ACS. 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 EP study with inducible VT. Underwent dual chamber [employer] ICD placement. Discharged home, weight 208 lbs.  Patient returns for HF followup.  Weight up 3 lbs.  He still has trouble with orthostatic symptoms but not severe. No dyspnea today when walking into the office.  No chest pain.  He gets short of breath with moderate exertion chronically.  He has been taking Lasix 20 mg daily.    ECG (personally reviewed): a-paced, RBBB, LAFB   Labs (2/15): K 4.2, creatinine 1.2, LDL 87, HDL 39, BNP 71 Labs (4/15): K 3.9, creatinine 1.1 Labs (12/15): LDL 73, HDL 37, K 3.9, creatinine 1.0 Labs (12/16): LDL 71, HDL 49 Labs (3/17): K 4.2, creatinine 1.28, HCT 45 Labs (8/18): LDL 82, HDL 41, TSH normal, hgb 16.3, K 4.1, creatinine 1.05 Labs (2/20): LFTs normal, pro BNP 842 Labs (3/20): K 3.8, creatinine 1.48, TSH mildly elevated at 6.97, free T3 and free T4 normal Labs (4/20): K 3.9, creatinine 1.36 Labs (2/20): LDL 52 Labs (8/20): BNP 241, creatinine 1.21  Labs (9/20): BNP 244, K 4, creatinine 1.12 Labs (2/21): K 4.3, creatinine 1.26, BNP 271 Labs (6/21): K 4.2, creatinine 1.11, LDL 57 Labs (6/22): K 4.3, creatinine 1.16, LDL 49 Labs (8/22): K 4.3, creatinine 1.37 Labs (12/22): K 3.9, creatinine 1.18 Labs (4/23): K 4.7, creatinine 1.35, BNP 253 Labs (6/23): K 4.1, creatinine 0.95 => 1.28   PMH: 1. Essential tremor 2. CAD: s/p CABG in 3/12.   - Cardiolite (8/18): EF 52%, no ischemia/infarction.  - NSTEMI (1/20):  LHC showed new occlusion of the branch of SVG-ramus and OM that touched down on OM.  There were also serial 70% stenoses in the mid/distal RCA.  FFR of RCA was negative.  - Cardiolite (12/21): EF 41%, no ischemia, fixed inferolateral defect - Cardiolite (3/23): LV EF 37%, fixed basal-mid inferolateral/inferior defect, no ischemia.   3. Atrial fibrillation: Only noted post-op CABG.  4. PE: Post-op CABG in 2012.  5. AAA: 3.6 cm on [place] in 3/14.  3.6 cm on [place] in 3/15. 3.6 cm on [place] 8/16. 4.1 cm on [place] 4/17.  - AAA [place] (5/18): 4.1 cm AAA.  - AAA [place] (5/19): 4.1 cm AAA.  - AAA [place] (4/20): 4.1 cm AAA.  - AAA [place] (9/21): 4.5 cm AAA - AAA [place] (9/22): 4.5 cm AAA 6. OSA: On CPAP.  7. Carotid stenosis: Carotid dopplers (4/40) with 34-74% LICA stenosis.  Carotid dopplers (2/59) with 56-38% LICA stenosis. Carotid dopplers (3/16) with 60-79% RCIA stenosis, 75-64% LICA stenosis.  - Carotid dopplers (8/16) with 40-59% RICA stenosis, 33-29% LICA stenosis.  - Carotid dopplers (8/17) with 51-88% RICA, 41-66% LICA. - Carotid dopplers (9/18) with 06-30% RICA, 16-01% LICA.   - Carotid dopplers (9/19) with 09-32% RICA, 35-57% LICA.  - Carotid dopplers (9/20) with 60-79% BICA - Carotid  dopplers (9/21): 32-20% RICA, 25-42% LICA - Right TCAR 70/62, left TCAR 5/22.  - Carotid dopplers (4/23): patent ICA stents.  8. Asthma 9. PNA x 2 10. Chronic diastolic CHF/dyspnea: Echo (9/14) with EF 60-65%, mild LVH, very mild AS with mean gradient 10 mmHg, RV moderately dilated.  PFTs (3/14) showed mild obstructive airways disease.  V/Q scan (3/14) with no PE.  ENT workup for upper respiratory causes was negative.  RHC (2/15) with mean RA 7, PA 32/12, mean PCWP 13, CI 3.18.  CPX (3/15) with peak VO2 16.4, VE/VCO2 33; normal when compared to age-matched sedentary normals; chronotropic incompetence was noted.  Dyspnea was improved with weight loss.  - Echo (8/17): EF 60-65%, mild AS.  - Echo (1/20): EF 55-60%, normal RV size and systolic function.  - Echo (6/20): EF 55%, mild LVH, mild AS, normal RV size and systolic function, ?LA mass or mass impinging on posterior LA.  - TEE (7/20): EF 50-55%, mild LVH, basal inferolateral aneurysm, normal RV size/systolic function, loculated pleural fluid behind LA.  - Echo (1/22): EF 40-45%, basal to mid inferolateral AK, basal  inferior HK, normal RV, mild aortic stenosis mean gradient 11 mmHg. - Echo (2/23): EF 45-50%, moderate LVH, normal RV, mild AS mean gradient 15 mmHg with IVC normal.  - Echo (4/23): EF 40-45%, akinesis of the inferolateral wall, basal inferolateral pseudoaneurysm, moderate RV enlargement, normal RV systolic function, mild AS with mean gradient 10 mmHg.  - Echo (6/23): EF 40-45% and pre-existing aneurysm of the basal inferolateral wall, mild-mod AS. 11. HTN 12. Aortic stenosis: Mild.  13. Ascending aortic aneurysm: CTA chest with 4.4 cm ascending aorta in 1/20.  - 4.2 cm ascending aorta on CT chest 6/20.  14. Atrial fibrillation: Paroxysmal.  15. CKD: Stage 3.  16. Left lung spontaneous PTX then recurrent left pleural effusion.  - Left VATS in 7/20.  17. Palpitations:  - Zio patch (6/20): NSR with 1 short NSVT run and few short SVT runs, no atrial fibrillation.  - Zio patch 2 wks (2/21): 6 short NSVT runs, 66 short SVT runs, rare PACs/PVCs.  - Zio patch (5/23): 2% AF burden, 4 NSVT runs longest 5 beats.  36. COVID-19 infection 4/22.  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  20. VT: Syncope in 6/23 with inducible VT. s/p dual chamber [employer] ICD  Current Outpatient Medications  Medication Sig Dispense Refill   acetaminophen (TYLENOL) 500 MG tablet Take 1,000 mg by mouth every 6 (six) hours as needed for moderate pain or headache.     apixaban (ELIQUIS) 5 MG TABS tablet Take 1 tablet (5 mg total) by mouth 2 (two) times daily. Restart after 5 days on 6/12     ASCORBIC ACID PO Take 500 mg by mouth daily.     atorvastatin (LIPITOR) 80 MG tablet Take 1 tablet (80 mg total) by mouth daily. 30 tablet 6   Carboxymethylcellulose Sod PF 0.5 % SOLN Place 1 drop into both eyes See admin instructions. Instill 1 drop into both eyes two to four times a day as needed for dryness     Cholecalciferol (VITAMIN D-3) 25 MCG (1000 UT) CAPS Take 1,000 Units by mouth daily.     fexofenadine (ALLEGRA) 180 MG  tablet Take 180 mg by mouth daily.     furosemide (LASIX) 40 MG tablet Take 0.5 tablets (20 mg total) by mouth as needed. 30 tablet 3   isosorbide mononitrate (IMDUR) 30 MG 24 hr tablet Take 1 tablet (30 mg total) by mouth  daily.     levothyroxine (SYNTHROID) 50 MCG tablet Take 50 mcg by mouth daily before breakfast.     Multiple Vitamins-Minerals (CENTRUM SILVER PO) Take 1 tablet by mouth See admin instructions. Take 1 tablet by mouth every other day with breakfast     pantoprazole (PROTONIX) 40 MG tablet Take 40 mg by mouth daily before breakfast.     tamsulosin (FLOMAX) 0.4 MG CAPS capsule Take 0.4 mg by mouth every evening.     traMADol (ULTRAM) 50 MG tablet Take 1 tablet (50 mg total) by mouth every 6 (six) hours as needed for moderate pain. 20 tablet 0   valsartan (DIOVAN) 40 MG tablet Take 0.5 tablets (20 mg total) by mouth 2 (two) times daily. 30 tablet 3   spironolactone (ALDACTONE) 25 MG tablet Take 1 tablet (25 mg total) by mouth at bedtime. 90 tablet 3   No current facility-administered medications for this encounter.    Allergies:   Ancef [cefazolin sodium], Empagliflozin, and Vancomycin   Social History:  The patient  reports that he quit smoking about 38 years ago. His smoking use included cigarettes. He has a 35.00 pack-year smoking history. He has never used smokeless tobacco. He reports current alcohol use of about 2.0 standard drinks of alcohol per week. He reports that he does not use drugs.   Family History:  The patient's family history includes Coronary artery disease in his mother; Heart disease in his mother; Hypertension in his mother.   ROS:  Please see the history of present illness.   All other systems are personally reviewed and negative.   Recent Labs: 02/23/2022: ALT 15; TSH 2.442 02/26/2022: Magnesium 2.2 05/23/2022: B Natriuretic Peptide 254.9; BUN 24; Creatinine, Ser 1.29; Hemoglobin 13.7; Platelets 148; Potassium 4.7; Sodium 141  Personally reviewed   BP  110/70   Pulse 70   Wt 93.3 kg (205 lb 9.6 oz)   SpO2 97%   BMI 25.36 kg/m   Wt Readings from Last 3 Encounters:  05/23/22 93.3 kg (205 lb 9.6 oz)  03/11/22 91.8 kg (202 lb 6.4 oz)  02/26/22 94.7 kg (208 lb 12.4 oz)    Physical Exam:   General: NAD Neck: No JVD, no thyromegaly or thyroid nodule.  Lungs: Clear to auscultation bilaterally with normal respiratory effort. CV: Nondisplaced PMI.  Heart regular S1/S2, no S3/S4, no murmur.  No peripheral edema.  No carotid bruit.  Normal pedal pulses.  Abdomen: Soft, nontender, no hepatosplenomegaly, no distention.  Skin: Intact without lesions or rashes.  Neurologic: Alert and oriented x 3.  Psych: Normal affect. Extremities: No clubbing or cyanosis.  HEENT: Normal.   Assessment & Plan: 1. Syncope: 6/23, most likely cardiogenic. Known tachy-brady syndrome with outpatient monitor showing post-conversion (AF => NSR) symptomatic pauses, up to 3 sec long. Has bifascicular block.  Echo (6/23) EF 40-45% and pre-existing aneurysm of the basal inferolateral wall, mild-mod AS. EP study in 6/23 w/ inducible VT. [employer] ICD placed.  - No further events. 2. CAD: s/p CABG 3/12. NSTEMI 1/20, LHC showed new occlusion of the branch of SVG-ramus and OM that touched down on OM.  There were also serial 70% stenoses in the mid/distal RCA.  FFR of RCA was negative.  No interventional target.  Cardiolite in 12/21 showed inferolateral infarction with no ischemia, consistent with findings on NSTEMI in 1/20.  Cardiolite in 3/23 showed inferior/inferolateral fixed defect with no ischemia.  No recent chest pain.  Syncope in 6/23 was not associated with ACS.  -  No ASA given Eliquis use.  - Continue atorvastatin 80 daily.   - Off ranolazine with prolonged QTc.  3. Hyperlipidemia: Continue statin. 4. Chronic HF with mid range EF: TEE in 7/20 showed EF 50-55%, basal inferolateral aneurysm, and normal RV.  Echo in 1/22 with EF 40-45%, basal-mid inferolateral akinesis and  inferior hypokinesis, normal RV.  Echo in 6/23 showed EF 40-45% and pre-existing aneurysm of the basal inferolateral wall, mild-mod AS.  NYHA class II, I think that his dyspnea is primarily related to his left lung disease (mild and stable).  He is not volume overloaded today.GDMT has been limited by orthostatic symptoms.  - Coreg had to be stopped due to orthostasis.  - Continue valsartan 20 mg bid (no Entresto due to lightheaded/hypotensive). - Continue Lasix 20 mg daily.  - Continue spironolactone 25 mg daily, take qhs to limit effect on BP during the day.   - Unable to tolerate dapaglifozin. - He needs to wear graded compression stockings during the day, I gave him a prescription.  5. Spontaneous left PTX with recurrent left-sided pleural effusion:  He is s/p left VATS/pleurodesis in 7/20.    6. Carotid stenosis: S/p bilateral TCARs, followed at [facility].     7. AAA: 4.5 cm AAA in 9/22, plan repeat AAA [place] in 9/23 (ordered).   8. Aortic stenosis: mild-mod on echo 6/23. 9. Atrial fibrillation: Paroxysmal. He is significantly symptomatic while in atrial fibrillation. He is off amiodarone due to concern for pulmonary toxicity.  He saw EP, did not recommend ablation. He would likely be a Tikosyn or sotalol candidate but would have to come into the hospital for it.  As he has had minimal atrial fibrillation recently, hold off Tikosyn/sotalol for now. NSR today.  - Continue Eliquis 5 mg bid. No bleeding issues. CBC today. 10. Pseudoaneurysm: Chronic PSA basal inferolateral wall.  This has been stable.  11. VT: EP study this admit w/ inducible VT.  Has [employer] ICD.  - No driving x 6 months.   [name] 05/25/2022

## 2022-05-28 ENCOUNTER — Ambulatory Visit (INDEPENDENT_AMBULATORY_CARE_PROVIDER_SITE_OTHER): Payer: Medicare Other

## 2022-05-28 DIAGNOSIS — I495 Sick sinus syndrome: Secondary | ICD-10-CM

## 2022-05-29 LAB — CUP PACEART REMOTE DEVICE CHECK
Date Time Interrogation Session: 20230907075756
Implantable Lead Implant Date: 20230606
Implantable Lead Implant Date: 20230606
Implantable Lead Location: 753859
Implantable Lead Location: 753860
Implantable Lead Model: 377
Implantable Lead Model: 402266
Implantable Lead Serial Number: 8000807784
Implantable Lead Serial Number: 81503989
Implantable Pulse Generator Implant Date: 20230606
Pulse Gen Model: 429534
Pulse Gen Serial Number: 84900693

## 2022-06-02 ENCOUNTER — Encounter: Payer: Self-pay | Admitting: Cardiology

## 2022-06-02 ENCOUNTER — Ambulatory Visit: Payer: Medicare Other | Attending: Cardiology | Admitting: Cardiology

## 2022-06-02 VITALS — BP 114/70 | HR 90 | Ht 75.5 in | Wt 205.2 lb

## 2022-06-02 DIAGNOSIS — I5022 Chronic systolic (congestive) heart failure: Secondary | ICD-10-CM | POA: Diagnosis not present

## 2022-06-02 DIAGNOSIS — I25119 Atherosclerotic heart disease of native coronary artery with unspecified angina pectoris: Secondary | ICD-10-CM | POA: Diagnosis not present

## 2022-06-02 NOTE — Patient Instructions (Signed)
Medication Instructions:  none *If you need a refill on your cardiac medications before your next appointment, please call your pharmacy*   Lab Work: none If you have labs (blood work) drawn today and your tests are completely normal, you will receive your results only by: MyChart Message (if you have MyChart) OR A paper copy in the mail If you have any lab test that is abnormal or we need to change your treatment, we will call you to review the results.   Testing/Procedures: none   Follow-Up: At [facility], you and your health needs are our priority.  As part of our continuing mission to provide you with exceptional heart care, we have created designated Provider Care Teams.  These Care Teams include your primary Cardiologist (physician) and Advanced Practice Providers (APPs -  Physician Assistants and Nurse Practitioners) who all work together to provide you with the care you need, when you need it.  We recommend signing up for the patient portal called "MyChart".  Sign up information is provided on this After Visit Summary.  MyChart is used to connect with patients for Virtual Visits (Telemedicine).  Patients are able to view lab/test results, encounter notes, upcoming appointments, etc.  Non-urgent messages can be sent to your provider as well.   To learn more about what you can do with MyChart, go to [email or web address].    Your next appointment:   1 year(s)  The format for your next appointment:   In Person  Provider:   You will see one of the following Advanced Practice Providers on your designated Care Team:   Renee Ursuy, PA-C Michael "Andy" Tillery, PA-C      Other Instructions none  Important Information About Sugar       

## 2022-06-02 NOTE — Progress Notes (Deleted)
Electrophysiology Office Follow up Visit Note:    Date:  06/02/2022   ID:  Eric Gordon, DOB 1931-01-28, MRN [id number]  PCP:  Shon Baton, MD  [facility] Cardiologist:  None  [facility] Electrophysiologist:  Vickie Epley, MD    Interval History:    Eric Gordon is a 88 y.o. male who presents for a follow up visit.  He had an ICD implanted February 25, 2022 for inducible sustained monomorphic VT, ischemic cardiomyopathy and syncope.       Past Medical History:  Diagnosis Date   Abdominal aortic aneurysm (AAA) (HCC)    Achilles tendinitis of right lower extremity    Arthritis    "my whole body" (03/19/2018)   Atherosclerosis of coronary artery bypass graft w/o angina pectoris    Atrial fibrillation (HCC)    Barrett's esophagus    CAD (coronary artery disease)    Carotid artery disease (HCC)    CHF (congestive heart failure) (HCC)    Chronic anticoagulation    PE   Chronic lower back pain    Dyspnea    Essential tremor    GERD (gastroesophageal reflux disease)    High cholesterol    Hyperlipidemia    Hyperplasia, prostate    Hypertension    Hypothyroidism    Myocardial infarction ([place])    2 in Jan. 2019   Nail dystrophy    OSA on CPAP    uses CPAP   Osteoarthrosis    Pneumonia    "now and once before" (03/19/2018)   Pulmonary embolism [facility])    April 2012 after CABG   S/P CABG (coronary artery bypass graft)    Sleep apnea    Spontaneous pneumothorax    left spontaneous pneumothorax/left pleural effusion, s/p chest tube 01/09/19; thoracentesis x2, s/p left VATS/tacl pleurodesis 04/12/19    Past Surgical History:  Procedure Laterality Date   ACHILLES TENDON REPAIR Bilateral    2006 & 2004   CARDIAC CATHETERIZATION  11/2010   CORONARY ANGIOGRAPHY N/A 10/22/2018   Procedure: CORONARY ANGIOGRAPHY;  Surgeon: Troy Sine, MD;  Location: [place] CV LAB;  Service: Cardiovascular;  Laterality: N/A;   CORONARY ARTERY BYPASS GRAFT  March 2012    CABG X 5   ESOPHAGOGASTRODUODENOSCOPY (EGD) WITH PROPOFOL N/A 12/10/2020   Procedure: ESOPHAGOGASTRODUODENOSCOPY (EGD) WITH PROPOFOL;  Surgeon: Doran Stabler, MD;  [facility];  Service: Gastroenterology;  Laterality: N/A;   ICD IMPLANT N/A 02/25/2022   Procedure: ICD IMPLANT;  Surgeon: Vickie Epley, MD;  Location: [place] CV LAB;  Service: Cardiovascular;  Laterality: N/A;   INGUINAL HERNIA REPAIR Left 1980   INTRAVASCULAR PRESSURE WIRE/FFR STUDY N/A 10/22/2018   Procedure: INTRAVASCULAR PRESSURE WIRE/FFR STUDY;  Surgeon: Troy Sine, MD;  Location: [place] CV LAB;  Service: Cardiovascular;  Laterality: N/A;   IR THORACENTESIS ASP PLEURAL SPACE W/IMG GUIDE  02/04/2019   IR THORACENTESIS ASP PLEURAL SPACE W/IMG GUIDE  02/24/2019   KNEE ARTHROSCOPY Left 2006   LEFT HEART CATH AND CORS/GRAFTS ANGIOGRAPHY N/A 10/18/2018   Procedure: LEFT HEART CATH AND CORS/GRAFTS ANGIOGRAPHY;  Surgeon: Lorretta Harp, MD;  Location: [place] CV LAB;  Service: Cardiovascular;  Laterality: N/A;   LEFT HEART CATH AND CORS/GRAFTS ANGIOGRAPHY N/A 10/21/2018   Procedure: LEFT HEART CATH AND CORS/GRAFTS ANGIOGRAPHY;  Surgeon: Troy Sine, MD;  Location: [place] CV LAB;  Service: Cardiovascular;  Laterality: N/A;   PACEMAKER IMPLANT N/A 02/24/2022   Procedure: PACEMAKER IMPLANT;  Surgeon: Deboraha Sprang, MD;  Location: [place] CV LAB;  Service: Cardiovascular;  Laterality: N/A;   PENILE PROSTHESIS IMPLANT     PLEURAL BIOPSY Left 04/12/2019   Procedure: Pleural Biopsy;  Surgeon: Grace Isaac, MD;  Location: [place];  Service: Thoracic;  Laterality: Left;   PLEURAL EFFUSION DRAINAGE Left 04/12/2019   Procedure: Drainage Of Pleural Effusion;  Surgeon: Grace Isaac, MD;  Location: [place];  Service: Thoracic;  Laterality: Left;   RIGHT HEART CATHETERIZATION N/A 11/14/2013   Procedure: RIGHT HEART CATH;  Surgeon: Larey Dresser, MD;  Location: [facility] CATH LAB;  Service:  Cardiovascular;  Laterality: N/A;   SHOULDER OPEN ROTATOR CUFF REPAIR Right 2003   TALC PLEURODESIS Left 04/12/2019   Procedure: Pietro Cassis;  Surgeon: Grace Isaac, MD;  Location: [place];  Service: Thoracic;  Laterality: Left;   TEE WITHOUT CARDIOVERSION N/A 03/24/2019   Procedure: TRANSESOPHAGEAL ECHOCARDIOGRAM (TEE);  Surgeon: Larey Dresser, MD;  Location: [facility] ENDOSCOPY;  Service: Cardiovascular;  Laterality: N/A;   TEE WITHOUT CARDIOVERSION  04/12/2019   Procedure: Transesophageal Echocardiogram (Tee);  Surgeon: Grace Isaac, MD;  Location: [place];  Service: Thoracic;;   THORACOSCOPY Left 04/12/2019   VIDEO BRONCHOSCOPY (N/A ) VIDEO ASSISTED THORACOSCOPY (Left Chest) TALC PLEURADESIS (Left    TRANSCAROTID ARTERY REVASCULARIZATION  Right 09/13/2020   Procedure: RIGHT TRANSCAROTID ARTERY REVASCULARIZATION USING 46m X 424mENROUTE TRANSCAROTID STEND SYSTEM;  Surgeon: BrSerafina MitchellMD;  [place] [facility]: V[facility];  Laterality: Right;   TRANSCAROTID ARTERY REVASCULARIZATION  Left 02/08/2021   Procedure: LEFT TRANSCAROTID ARTERY REVASCULARIZATION;  Surgeon: BrSerafina MitchellMD;  [place] [facility]: Vascular;  Laterality: Left;   ULTRASOUND GUIDANCE FOR VASCULAR ACCESS  09/13/2020   Procedure: ULTRASOUND GUIDANCE FOR VASCULAR ACCESS;  Surgeon: BrSerafina MitchellMD;  [place] [facility] Service: Vascular;;   VIDEO ASSISTED THORACOSCOPY Left 04/12/2019   Procedure: VIDEO ASSISTED THORACOSCOPY;  Surgeon: GeGrace IsaacMD;  [facility]: Thor[facility];  Laterality: Left;   VIDEO BRONCHOSCOPY N/A 04/12/2019   Procedure: VIDEO BRONCHOSCOPY;  Surgeon: GeGrace IsaacMD;  [facility];  Service: Thoracic;  Laterality: N/A;    Current Medications: No outpatient medications have been marked as taking for the 06/02/22 encounter (Appointment) with LaVickie EpleyMD.     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[employer]  Tobacco Use   Smoking status: Former    Packs/day: 1.00    Years: 35.00    Total pack years: 35.00    Types: Cigarettes    Quit date: 02/18/1984    Years since quitting: 38.3   Smokeless tobacco: Never  Vaping Use   Vaping Use: Never used  Substance and Sexual Activity   Alcohol use: Yes    Alcohol/week: 2.0 standard drinks of alcohol    Types: 2 Standard drinks or equivalent per week   Drug use: Never   Sexual activity: Not on file  Other Topics Concern   Not on file  Social History Narrative   Not on file   Social Determinants of Health   Financial Resource Strain:  Not on file  Food Insecurity: Not on file  Transportation Needs: Not on file  Physical Activity: Not on file  Stress: Not on file  Social Connections: Not on file     Family History: The patient's family history includes Coronary artery disease in his mother; Heart disease in his mother; Hypertension in his mother.  ROS:   Please see the history of present illness.    All other systems reviewed and are negative.  EKGs/Labs/Other Studies Reviewed:    The following studies were reviewed today:  June 02, 2022 in clinic device interrogation personally reviewed ***     Recent Labs: 02/23/2022: ALT 15; TSH 2.442 02/26/2022: Magnesium 2.2 05/23/2022: B Natriuretic Peptide 254.9; BUN 24; Creatinine, Ser 1.29; Hemoglobin 13.7; Platelets 148; Potassium 4.7; Sodium 141  Recent Lipid Panel    Component Value Date/Time   CHOL 113 02/18/2022 0920   TRIG 62 02/18/2022 0920   HDL 39 (L) 02/18/2022 0920   CHOLHDL 2.9 02/18/2022 0920   VLDL 12 02/18/2022 0920   LDLCALC 62 02/18/2022 0920    Physical Exam:    VS:  There were no vitals taken for this visit.     Wt Readings from Last 3 Encounters:  05/23/22 205 lb 9.6 oz (93.3 kg)  03/11/22 202 lb 6.4 oz (91.8 kg)  02/26/22 208 lb 12.4 oz (94.7 kg)     GEN: *** Well nourished, well developed in no acute distress HEENT: Normal NECK: No JVD; No carotid bruits LYMPHATICS: No lymphadenopathy CARDIAC: ***RRR, no murmurs, rubs, gallops.  ICD pocket well-healed RESPIRATORY:  Clear to auscultation without rales, wheezing or rhonchi  ABDOMEN: Soft, non-tender, non-distended MUSCULOSKELETAL:  No edema; No deformity  SKIN: Warm and dry NEUROLOGIC:  Alert and oriented x 3 PSYCHIATRIC:  Normal affect        ASSESSMENT:    1. Chronic systolic heart failure ([place])   2. Coronary artery disease with angina pectoris, unspecified vessel or lesion type, unspecified whether native or transplanted heart ([place])    PLAN:    In order of problems listed above:  #Chronic systolic heart failure #Ventricular tachycardia NYHA class II.  Warm and dry on exam today. Continue valsartan, spironolactone, Imdur, Lasix.  #ICD in situ Device functioning appropriately.  Continue remote monitoring.  #Paroxysmal atrial fibrillation On Eliquis.  Burden 10.6% on most recent remote monitor.  Follow-up 1 year or sooner as needed.     Total time spent with patient today *** minutes. This includes reviewing records, evaluating the patient and coordinating care.   Medication Adjustments/Labs and Tests Ordered: Current medicines are reviewed at length with the patient today.  Concerns regarding medicines are outlined above.  No orders of the defined types were placed in this encounter.  No orders of the defined types were placed in this encounter.    Signed, Lars Mage, MD, [facility], [facility] 06/02/2022 6:05 AM    Electrophysiology [facility]

## 2022-06-02 NOTE — Progress Notes (Signed)
Electrophysiology Office Follow up Visit Note:    Date:  06/02/2022   ID:  Eric Gordon, DOB 04-06-1931, MRN [id number]  PCP:  Shon Baton, MD  [facility] Cardiologist:  None  [facility] Electrophysiologist:  Vickie Epley, MD    Interval History:    Eric Gordon is a 88 y.o. male who presents for a follow up visit. He had an ICD implanted February 25, 2022 for inducible sustained monomorphic VT, ischemic cardiomyopathy and syncope.  Today, he confirms some ongoing soreness of his device pocket. Otherwise he states it is healing well.  He reports having episodes of atrial fibrillation for 3 days in a row recently.  Additionally he complains of occasional dizzy spells. No presyncope or syncope.  When he woke up this morning his blood pressure was 88/57. He denies feeling any symptoms at that time. In clinic today his BP is 114/70.   He denies any shortness of breath, or peripheral edema. No headaches, syncope, orthopnea, or PND.      Past Medical History:  Diagnosis Date   Abdominal aortic aneurysm (AAA) (HCC)    Achilles tendinitis of right lower extremity    Arthritis    "my whole body" (03/19/2018)   Atherosclerosis of coronary artery bypass graft w/o angina pectoris    Atrial fibrillation (HCC)    Barrett's esophagus    CAD (coronary artery disease)    Carotid artery disease (HCC)    CHF (congestive heart failure) (HCC)    Chronic anticoagulation    PE   Chronic lower back pain    Dyspnea    Essential tremor    GERD (gastroesophageal reflux disease)    High cholesterol    Hyperlipidemia    Hyperplasia, prostate    Hypertension    Hypothyroidism    Myocardial infarction ([place])    2 in Jan. 2019   Nail dystrophy    OSA on CPAP    uses CPAP   Osteoarthrosis    Pneumonia    "now and once before" (03/19/2018)   Pulmonary embolism [facility])    April 2012 after CABG   S/P CABG (coronary artery bypass graft)    Sleep apnea    Spontaneous  pneumothorax    left spontaneous pneumothorax/left pleural effusion, s/p chest tube 01/09/19; thoracentesis x2, s/p left VATS/tacl pleurodesis 04/12/19    Past Surgical History:  Procedure Laterality Date   ACHILLES TENDON REPAIR Bilateral    2006 & 2004   CARDIAC CATHETERIZATION  11/2010   CORONARY ANGIOGRAPHY N/A 10/22/2018   Procedure: CORONARY ANGIOGRAPHY;  Surgeon: Troy Sine, MD;  Location: [place] CV LAB;  Service: Cardiovascular;  Laterality: N/A;   CORONARY ARTERY BYPASS GRAFT  March 2012   CABG X 5   ESOPHAGOGASTRODUODENOSCOPY (EGD) WITH PROPOFOL N/A 12/10/2020   Procedure: ESOPHAGOGASTRODUODENOSCOPY (EGD) WITH PROPOFOL;  Surgeon: Doran Stabler, MD;  [facility];  Service: Gastroenterology;  Laterality: N/A;   ICD IMPLANT N/A 02/25/2022   Procedure: ICD IMPLANT;  Surgeon: Vickie Epley, MD;  Location: [place] CV LAB;  Service: Cardiovascular;  Laterality: N/A;   INGUINAL HERNIA REPAIR Left 1980   INTRAVASCULAR PRESSURE WIRE/FFR STUDY N/A 10/22/2018   Procedure: INTRAVASCULAR PRESSURE WIRE/FFR STUDY;  Surgeon: Troy Sine, MD;  Location: [place] CV LAB;  Service: Cardiovascular;  Laterality: N/A;   IR THORACENTESIS ASP PLEURAL SPACE W/IMG GUIDE  02/04/2019   IR THORACENTESIS ASP PLEURAL SPACE W/IMG GUIDE  02/24/2019   KNEE ARTHROSCOPY Left  2006   LEFT HEART CATH AND CORS/GRAFTS ANGIOGRAPHY N/A 10/18/2018   Procedure: LEFT HEART CATH AND CORS/GRAFTS ANGIOGRAPHY;  Surgeon: Lorretta Harp, MD;  Location: [place] CV LAB;  Service: Cardiovascular;  Laterality: N/A;   LEFT HEART CATH AND CORS/GRAFTS ANGIOGRAPHY N/A 10/21/2018   Procedure: LEFT HEART CATH AND CORS/GRAFTS ANGIOGRAPHY;  Surgeon: Troy Sine, MD;  Location: [place] CV LAB;  Service: Cardiovascular;  Laterality: N/A;   PACEMAKER IMPLANT N/A 02/24/2022   Procedure: PACEMAKER IMPLANT;  Surgeon: Deboraha Sprang, MD;  Location: [place] CV LAB;  Service: Cardiovascular;  Laterality:  N/A;   PENILE PROSTHESIS IMPLANT     PLEURAL BIOPSY Left 04/12/2019   Procedure: Pleural Biopsy;  Surgeon: Grace Isaac, MD;  Location: [place];  Service: Thoracic;  Laterality: Left;   PLEURAL EFFUSION DRAINAGE Left 04/12/2019   Procedure: Drainage Of Pleural Effusion;  Surgeon: Grace Isaac, MD;  Location: [place];  Service: Thoracic;  Laterality: Left;   RIGHT HEART CATHETERIZATION N/A 11/14/2013   Procedure: RIGHT HEART CATH;  Surgeon: Larey Dresser, MD;  Location: [facility] CATH LAB;  Service: Cardiovascular;  Laterality: N/A;   SHOULDER OPEN ROTATOR CUFF REPAIR Right 2003   TALC PLEURODESIS Left 04/12/2019   Procedure: Pietro Cassis;  Surgeon: Grace Isaac, MD;  Location: [place];  Service: Thoracic;  Laterality: Left;   TEE WITHOUT CARDIOVERSION N/A 03/24/2019   Procedure: TRANSESOPHAGEAL ECHOCARDIOGRAM (TEE);  Surgeon: Larey Dresser, MD;  Location: [facility] ENDOSCOPY;  Service: Cardiovascular;  Laterality: N/A;   TEE WITHOUT CARDIOVERSION  04/12/2019   Procedure: Transesophageal Echocardiogram (Tee);  Surgeon: Grace Isaac, MD;  Location: [facility] OR;  Service: Thoracic;;   THORACOSCOPY Left 04/12/2019   VIDEO BRONCHOSCOPY (N/A ) VIDEO ASSISTED THORACOSCOPY (Left Chest) TALC PLEURADESIS (Left    TRANSCAROTID ARTERY REVASCULARIZATION  Right 09/13/2020   Procedure: RIGHT TRANSCAROTID ARTERY REVASCULARIZATION USING 23m X 426mENROUTE TRANSCAROTID STEND SYSTEM;  Surgeon: BrSerafina MitchellMD;  [facility]: V[facility];  Laterality: Right;   TRANSCAROTID ARTERY REVASCULARIZATION  Left 02/08/2021   Procedure: LEFT TRANSCAROTID ARTERY REVASCULARIZATION;  Surgeon: BrSerafina MitchellMD;  [facility]: Vascular;  Laterality: Left;   ULTRASOUND GUIDANCE FOR VASCULAR ACCESS  09/13/2020   Procedure: ULTRASOUND GUIDANCE FOR VASCULAR ACCESS;  Surgeon: BrSerafina MitchellMD;  [place] [facility] [facility] V[facility];;   VIDEO ASSISTED THORACOSCOPY Left 04/12/2019   Procedure: VIDEO  ASSISTED THORACOSCOPY;  Surgeon: GeGrace IsaacMD;  [facility] Service: Thoracic;  Laterality: Left;   VIDEO BRONCHOSCOPY N/A 04/12/2019   Procedure: VIDEO BRONCHOSCOPY;  Surgeon: GeGrace IsaacMD;  [facility];  Service: Thoracic;  Laterality: N/A;    Current Medications: Current Meds  Medication Sig   acetaminophen (TYLENOL) 500 MG tablet Take 1,000 mg by mouth every 6 (six) hours as needed for moderate pain or headache.   apixaban (ELIQUIS) 5 MG TABS tablet Take 1 tablet (5 mg total) by mouth 2 (two) times daily. Restart after 5 days on 6/12   ASCORBIC ACID PO Take 500 mg by mouth daily.   atorvastatin (LIPITOR) 80 MG tablet Take 1 tablet (80 mg total) by mouth daily.   Carboxymethylcellulose Sod PF 0.5 % SOLN Place 1 drop into both eyes See admin instructions. Instill 1 drop into both eyes two to four times a day as needed for dryness   Cholecalciferol (VITAMIN D-3) 25 MCG (1000 UT) CAPS Take 1,000 Units by  mouth daily.   fexofenadine (ALLEGRA) 180 MG tablet Take 180 mg by mouth daily.   furosemide (LASIX) 40 MG tablet Take 0.5 tablets (20 mg total) by mouth as needed.   isosorbide mononitrate (IMDUR) 30 MG 24 hr tablet Take 1 tablet (30 mg total) by mouth daily.   levothyroxine (SYNTHROID) 50 MCG tablet Take 50 mcg by mouth daily before breakfast.   Multiple Vitamins-Minerals (CENTRUM SILVER PO) Take 1 tablet by mouth See admin instructions. Take 1 tablet by mouth every other day with breakfast   pantoprazole (PROTONIX) 40 MG tablet Take 40 mg by mouth daily before breakfast.   spironolactone (ALDACTONE) 25 MG tablet Take 1 tablet (25 mg total) by mouth at bedtime.   tamsulosin (FLOMAX) 0.4 MG CAPS capsule Take 0.4 mg by mouth every evening.   traMADol (ULTRAM) 50 MG tablet Take 1 tablet (50 mg total) by mouth every 6 (six) hours as needed for moderate pain.   valsartan (DIOVAN) 40 MG tablet Take 0.5 tablets (20 mg total) by mouth 2 (two) times daily.      Allergies:   Ancef [cefazolin sodium], Empagliflozin, and Vancomycin   Social History   Socioeconomic History   Marital status: Married    Spouse name: Not on file   Number of children: 2   Years of education: Not on file   Highest education level: Not on file  Occupational History   Occupation: Scientist, clinical (histocompatibility and immunogenetics): [employer]  Tobacco Use   Smoking status: Former    Packs/day: 1.00    Years: 35.00    Total pack years: 35.00    Types: Cigarettes    Quit date: 02/18/1984    Years since quitting: 38.3   Smokeless tobacco: Never  Vaping Use   Vaping Use: Never used  Substance and Sexual Activity   Alcohol use: Yes    Alcohol/week: 2.0 standard drinks of alcohol    Types: 2 Standard drinks or equivalent per week   Drug use: Never   Sexual activity: Not on file  Other Topics Concern   Not on file  Social History Narrative   Not on file   Social Determinants of Health   Financial Resource Strain: Not on file  Food Insecurity: Not on file  Transportation Needs: Not on file  Physical Activity: Not on file  Stress: Not on file  Social Connections: Not on file     Family History: The patient's family history includes Coronary artery disease in his mother; Heart disease in his mother; Hypertension in his mother.  ROS:   Please see the history of present illness.    (+) Palpitations (+) Soreness of device pocket (+) Dizzy spells All other systems reviewed and are negative.  EKGs/Labs/Other Studies Reviewed:    The following studies were reviewed today:  September 07/2022 In clinic device interrogation personally reviewed: Battery longevity 100%, lead parameters stable Atrial pacing 44% Ventricular pacing percentage atrial 10.4% No high-voltage therapies delivered   EKG:  EKG is personally reviewed.    Recent Labs: 02/23/2022: ALT 15; TSH 2.442 02/26/2022: Magnesium 2.2 05/23/2022: B Natriuretic Peptide 254.9; BUN 24; Creatinine, Ser 1.29; Hemoglobin 13.7; Platelets  148; Potassium 4.7; Sodium 141   Recent Lipid Panel    Component Value Date/Time   CHOL 113 02/18/2022 0920   TRIG 62 02/18/2022 0920   HDL 39 (L) 02/18/2022 0920   CHOLHDL 2.9 02/18/2022 0920   VLDL 12 02/18/2022 0920   LDLCALC 62 02/18/2022 0920    Physical  Exam:    VS:  BP 114/70   Pulse 90   Ht 6' 3.5" (1.918 m)   Wt 205 lb 3.2 oz (93.1 kg)   SpO2 96%   BMI 25.31 kg/m     Wt Readings from Last 3 Encounters:  06/02/22 205 lb 3.2 oz (93.1 kg)  05/23/22 205 lb 9.6 oz (93.3 kg)  03/11/22 202 lb 6.4 oz (91.8 kg)     GEN: Well nourished, well developed in no acute distress HEENT: Normal NECK: No JVD; No carotid bruits LYMPHATICS: No lymphadenopathy CARDIAC: RRR, no murmurs, rubs, gallops; ICD pocket well healed. RESPIRATORY:  Clear to auscultation without rales, wheezing or rhonchi  ABDOMEN: Soft, non-tender, non-distended MUSCULOSKELETAL:  No edema; No deformity  SKIN: Warm and dry NEUROLOGIC:  Alert and oriented x 3 PSYCHIATRIC:  Normal affect        ASSESSMENT:    1. Chronic systolic heart failure ([place])   2. Coronary artery disease with angina pectoris, unspecified vessel or lesion type, unspecified whether native or transplanted heart ([place])    PLAN:    In order of problems listed above:  #Chronic systolic heart failure #Ventricular tachycardia NYHA class II.  Warm and dry on exam today. Continue valsartan, spironolactone, Imdur, Lasix.   #ICD in situ Device functioning appropriately.  Continue remote monitoring.   #Paroxysmal atrial fibrillation On Eliquis.  Burden 10.6% on most recent remote monitor.    Follow-up 1 year or sooner as needed.  Total time spent with patient today 41 minutes. This includes reviewing records, evaluating the patient and coordinating care.   Medication Adjustments/Labs and Tests Ordered: Current medicines are reviewed at length with the patient today.  Concerns regarding medicines are outlined above.  No orders of  the defined types were placed in this encounter.  No orders of the defined types were placed in this encounter.   I,Mathew Stumpf,acting as a Education administrator for Vickie Epley, MD.,have documented all relevant documentation on the behalf of Vickie Epley, MD,as directed by  Vickie Epley, MD while in the presence of Vickie Epley, MD.  I, Vickie Epley, MD, have reviewed all documentation for this visit. The documentation on 06/02/22 for the exam, diagnosis, procedures, and orders are all accurate and complete.   Signed, Lars Mage, MD, [facility], [facility] 06/02/2022 4:08 PM    Electrophysiology [facility]

## 2022-06-11 ENCOUNTER — Other Ambulatory Visit (HOSPITAL_COMMUNITY): Payer: Self-pay | Admitting: Cardiology

## 2022-06-11 ENCOUNTER — Ambulatory Visit (HOSPITAL_COMMUNITY)
Admission: RE | Admit: 2022-06-11 | Discharge: 2022-06-11 | Disposition: A | Discharge status: Home / Self Care | Payer: Medicare Other | Source: Ambulatory Visit | Attending: Cardiology | Admitting: Cardiology

## 2022-06-11 DIAGNOSIS — I714 Abdominal aortic aneurysm, without rupture, unspecified: Secondary | ICD-10-CM | POA: Insufficient documentation

## 2022-06-11 DIAGNOSIS — I7143 Infrarenal abdominal aortic aneurysm, without rupture: Secondary | ICD-10-CM | POA: Diagnosis not present

## 2022-06-12 ENCOUNTER — Telehealth (HOSPITAL_COMMUNITY): Payer: Self-pay | Admitting: Cardiology

## 2022-06-12 ENCOUNTER — Telehealth: Payer: Self-pay

## 2022-06-12 DIAGNOSIS — I714 Abdominal aortic aneurysm, without rupture, unspecified: Secondary | ICD-10-CM

## 2022-06-12 NOTE — Telephone Encounter (Signed)
Alert received for VF treated with ATP and atrial arrhythmias.  Spoke with Pt.  He was aware of the tachycardia, he states it happened after he was done eating lunch and he had chest pain during that episode.  The chest pain resolved when the tachycardia was treated.  He denies SOB related to episode, states he is SOB "all the time".  Advised no driving for 6 months.  Advised would send to Dr. Quentin Ore for review and call back with further instructions.  All episodes were grouped in time range 12:45 pm to 1:00 pm.     VF episode with ATP     Atrial arrhythmia episode (longest)

## 2022-06-12 NOTE — Telephone Encounter (Signed)
Patient called.  Patient aware. Referral placed 

## 2022-06-12 NOTE — Telephone Encounter (Signed)
-----   Message from Larey Dresser, MD sent at 06/11/2022  6:26 PM EDT ----- AAA enlarged to 4.9 cm.  Please make [facility] referral, Dr. Trula Slade.

## 2022-06-16 NOTE — Progress Notes (Signed)
Remote ICD transmission.   

## 2022-06-17 NOTE — Telephone Encounter (Signed)
Patient needs apt with EP APP this week per Dr. Quentin Ore.

## 2022-06-20 ENCOUNTER — Ambulatory Visit: Payer: Medicare Other | Attending: Student | Admitting: Student

## 2022-06-20 ENCOUNTER — Encounter: Payer: Self-pay | Admitting: Student

## 2022-06-20 VITALS — BP 88/0 | HR 86 | Ht 75.0 in | Wt 203.0 lb

## 2022-06-20 DIAGNOSIS — I714 Abdominal aortic aneurysm, without rupture, unspecified: Secondary | ICD-10-CM | POA: Diagnosis not present

## 2022-06-20 DIAGNOSIS — I6523 Occlusion and stenosis of bilateral carotid arteries: Secondary | ICD-10-CM | POA: Diagnosis not present

## 2022-06-20 DIAGNOSIS — I5022 Chronic systolic (congestive) heart failure: Secondary | ICD-10-CM | POA: Insufficient documentation

## 2022-06-20 DIAGNOSIS — I25119 Atherosclerotic heart disease of native coronary artery with unspecified angina pectoris: Secondary | ICD-10-CM | POA: Insufficient documentation

## 2022-06-20 DIAGNOSIS — Z79899 Other long term (current) drug therapy: Secondary | ICD-10-CM | POA: Diagnosis not present

## 2022-06-20 DIAGNOSIS — I951 Orthostatic hypotension: Secondary | ICD-10-CM | POA: Insufficient documentation

## 2022-06-20 LAB — CUP PACEART INCLINIC DEVICE CHECK
Date Time Interrogation Session: 20230929101848
Implantable Lead Implant Date: 20230606
Implantable Lead Implant Date: 20230606
Implantable Lead Location: 753859
Implantable Lead Location: 753860
Implantable Lead Model: 377
Implantable Lead Model: 402266
Implantable Lead Serial Number: 8000807784
Implantable Lead Serial Number: 81503989
Implantable Pulse Generator Implant Date: 20230606
Pulse Gen Model: 429534
Pulse Gen Serial Number: 84900693

## 2022-06-20 MED ORDER — LOSARTAN POTASSIUM 25 MG PO TABS: 12.5000 mg | ORAL_TABLET | Freq: Every day | ORAL | 3 refills | Status: DC

## 2022-06-20 MED ORDER — MIDODRINE HCL 2.5 MG PO TABS: 2.5000 mg | ORAL_TABLET | Freq: Two times a day (BID) | ORAL | 3 refills | Status: DC

## 2022-06-20 NOTE — Progress Notes (Signed)
Electrophysiology Office Note Date: 06/20/2022  ID:  Eric Gordon, DOB 07/21/1931, MRN [id number]  PCP: Shon Baton, MD Primary Cardiologist: None Electrophysiologist: Vickie Epley, MD   CC: Routine ICD follow-up  Eric Gordon is a 88 y.o. male seen today for Vickie Epley, MD for acute visit due to appropriate ICD therapy.    Patient reports feeling lightheaded and overall poorly since the episode. He was seated at lunch when wife states he "stared off" into the distance for a moment. He has had some lightheadedness and fatigue overall, that seems worse since. He took all of his medications this morning already. There have previously been mentions of considering midodrine if his BP remains low. He has also had transient episode of blurred vision, which seem to be consistent with BP dropping.   Device History: Biotronik Dual Chamber ICD implanted 02/25/2022 for VT and ICM History of appropriate therapy: Yes History of AAD therapy: Previously taken off amiodarone with concerns for pulm tox (for AF)  Past Medical History:  Diagnosis Date   Abdominal aortic aneurysm (AAA) ([place])    Achilles tendinitis of right lower extremity    Arthritis    "my whole body" (03/19/2018)   Atherosclerosis of coronary artery bypass graft w/o angina pectoris    Atrial fibrillation (HCC)    Barrett's esophagus    CAD (coronary artery disease)    Carotid artery disease (HCC)    CHF (congestive heart failure) (HCC)    Chronic anticoagulation    PE   Chronic lower back pain    Dyspnea    Essential tremor    GERD (gastroesophageal reflux disease)    High cholesterol    Hyperlipidemia    Hyperplasia, prostate    Hypertension    Hypothyroidism    Myocardial infarction ([place])    2 in Jan. 2019   Nail dystrophy    OSA on CPAP    uses CPAP   Osteoarthrosis    Pneumonia    "now and once before" (03/19/2018)   Pulmonary embolism [facility])    April 2012 after CABG   S/P CABG  (coronary artery bypass graft)    Sleep apnea    Spontaneous pneumothorax    left spontaneous pneumothorax/left pleural effusion, s/p chest tube 01/09/19; thoracentesis x2, s/p left VATS/tacl pleurodesis 04/12/19   Past Surgical History:  Procedure Laterality Date   ACHILLES TENDON REPAIR Bilateral    2006 & 2004   CARDIAC CATHETERIZATION  11/2010   CORONARY ANGIOGRAPHY N/A 10/22/2018   Procedure: CORONARY ANGIOGRAPHY;  Surgeon: Troy Sine, MD;  Location: [place] [facility];  Service: Cardiovascular;  Laterality: N/A;   CORONARY ARTERY BYPASS GRAFT  March 2012   CABG X 5   ESOPHAGOGASTRODUODENOSCOPY (EGD) WITH PROPOFOL N/A 12/10/2020   Procedure: ESOPHAGOGASTRODUODENOSCOPY (EGD) WITH PROPOFOL;  Surgeon: Doran Stabler, MD;  Location: [facility];  Service: Gastroenterology;  Laterality: N/A;   ICD IMPLANT N/A 02/25/2022   Procedure: ICD IMPLANT;  Surgeon: Vickie Epley, MD;  Location: [place] [facility];  Service: Cardiovascular;  Laterality: N/A;   INGUINAL HERNIA REPAIR Left 1980   INTRAVASCULAR PRESSURE WIRE/FFR STUDY N/A 10/22/2018   Procedure: INTRAVASCULAR PRESSURE WIRE/FFR STUDY;  Surgeon: Troy Sine, MD;  Location: [place] CV LAB;  Service: Cardiovascular;  Laterality: N/A;   IR THORACENTESIS ASP PLEURAL SPACE W/IMG GUIDE  02/04/2019   IR THORACENTESIS ASP PLEURAL SPACE W/IMG GUIDE  02/24/2019   KNEE ARTHROSCOPY Left 2006  LEFT HEART CATH AND CORS/GRAFTS ANGIOGRAPHY N/A 10/18/2018   Procedure: LEFT HEART CATH AND CORS/GRAFTS ANGIOGRAPHY;  Surgeon: Lorretta Harp, MD;  Location: [place] [facility];  Service: Cardiovascular;  Laterality: N/A;   LEFT HEART CATH AND CORS/GRAFTS ANGIOGRAPHY N/A 10/21/2018   Procedure: LEFT HEART CATH AND CORS/GRAFTS ANGIOGRAPHY;  Surgeon: Troy Sine, MD;  Location: [place] [facility];  Service: Cardiovascular;  Laterality: N/A;   PACEMAKER IMPLANT N/A 02/24/2022   Procedure: PACEMAKER IMPLANT;  Surgeon: Deboraha Sprang, MD;   Location: [place] CV LAB;  Service: Cardiovascular;  Laterality: N/A;   PENILE PROSTHESIS IMPLANT     PLEURAL BIOPSY Left 04/12/2019   Procedure: Pleural Biopsy;  Surgeon: Grace Isaac, MD;  Location: [place];  Service: Thoracic;  Laterality: Left;   PLEURAL EFFUSION DRAINAGE Left 04/12/2019   Procedure: Drainage Of Pleural Effusion;  Surgeon: Grace Isaac, MD;  Location: [place];  Service: Thoracic;  Laterality: Left;   RIGHT HEART CATHETERIZATION N/A 11/14/2013   Procedure: RIGHT HEART CATH;  Surgeon: Larey Dresser, MD;  [facility] C[facility] LAB;  Service: Cardiovascular;  Laterality: N/A;   SHOULDER OPEN ROTATOR CUFF REPAIR Right 2003   TALC PLEURODESIS Left 04/12/2019   Procedure: Pietro Cassis;  Surgeon: Grace Isaac, MD;  Location: [place];  Service: Thoracic;  Laterality: Left;   TEE WITHOUT CARDIOVERSION N/A 03/24/2019   Procedure: TRANSESOPHAGEAL ECHOCARDIOGRAM (TEE);  Surgeon: Larey Dresser, MD;  Location: [facility] ENDOSCOPY;  Service: Cardiovascular;  Laterality: N/A;   TEE WITHOUT CARDIOVERSION  04/12/2019   Procedure: Transesophageal Echocardiogram (Tee);  Surgeon: Grace Isaac, MD;  [facility] OR;  Service: Thoracic;;   THORACOSCOPY Left 04/12/2019   VIDEO BRONCHOSCOPY (N/A ) VIDEO ASSISTED THORACOSCOPY (Left Chest) TALC PLEURADESIS (Left    TRANSCAROTID ARTERY REVASCULARIZATION  Right 09/13/2020   Procedure: RIGHT TRANSCAROTID ARTERY REVASCULARIZATION USING 29m X 475mENROUTE TRANSCAROTID STEND SYSTEM;  Surgeon: BrSerafina MitchellMD;  [facility] Service: Vascular;  Laterality: Right;   TRANSCAROTID ARTERY REVASCULARIZATION  Left 02/08/2021   Procedure: LEFT TRANSCAROTID ARTERY REVASCULARIZATION;  Surgeon: BrSerafina MitchellMD;  [place] Service: Vascular;  Laterality: Left;   ULTRASOUND GUIDANCE FOR VASCULAR ACCESS  09/13/2020   Procedure: ULTRASOUND GUIDANCE FOR VASCULAR ACCESS;  Surgeon: BrSerafina MitchellMD;  [place] [facility] Service:  Vascular;;   VIDEO ASSISTED THORACOSCOPY Left 04/12/2019   Procedure: VIDEO ASSISTED THORACOSCOPY;  Surgeon: GeGrace IsaacMD;  [place] [facility] Service: Thoracic;  Laterality: Left;   VIDEO BRONCHOSCOPY N/A 04/12/2019   Procedure: VIDEO BRONCHOSCOPY;  Surgeon: GeGrace IsaacMD;  Location: [facility] OR;  Service: Thoracic;  Laterality: N/A;    Current Outpatient Medications  Medication Sig Dispense Refill   acetaminophen (TYLENOL) 500 MG tablet Take 1,000 mg by mouth every 6 (six) hours as needed for moderate pain or headache.     apixaban (ELIQUIS) 5 MG TABS tablet Take 1 tablet (5 mg total) by mouth 2 (two) times daily. Restart after 5 days on 6/12     ASCORBIC ACID PO Take 500 mg by mouth daily.     atorvastatin (LIPITOR) 80 MG tablet Take 1 tablet (80 mg total) by mouth daily. 30 tablet 6   Carboxymethylcellulose Sod PF 0.5 % SOLN Place 1 drop into both eyes See admin instructions. Instill 1 drop into both eyes two to four times a day as needed for dryness     Cholecalciferol (VITAMIN D-3)  25 MCG (1000 UT) CAPS Take 1,000 Units by mouth daily.     fexofenadine (ALLEGRA) 180 MG tablet Take 180 mg by mouth daily.     furosemide (LASIX) 40 MG tablet Take 0.5 tablets (20 mg total) by mouth as needed. 30 tablet 3   isosorbide mononitrate (IMDUR) 30 MG 24 hr tablet Take 1 tablet (30 mg total) by mouth daily.     levothyroxine (SYNTHROID) 50 MCG tablet Take 50 mcg by mouth daily before breakfast.     Multiple Vitamins-Minerals (CENTRUM SILVER PO) Take 1 tablet by mouth See admin instructions. Take 1 tablet by mouth every other day with breakfast     pantoprazole (PROTONIX) 40 MG tablet Take 40 mg by mouth daily before breakfast.     spironolactone (ALDACTONE) 25 MG tablet Take 1 tablet (25 mg total) by mouth at bedtime. 90 tablet 3   tamsulosin (FLOMAX) 0.4 MG CAPS capsule Take 0.4 mg by mouth every evening.     traMADol (ULTRAM) 50 MG tablet Take 1 tablet (50 mg total) by mouth every 6 (six)  hours as needed for moderate pain. 20 tablet 0   valsartan (DIOVAN) 40 MG tablet Take 0.5 tablets (20 mg total) by mouth 2 (two) times daily. 30 tablet 3   No current facility-administered medications for this visit.    Allergies:   Ancef [cefazolin sodium], Empagliflozin, and Vancomycin   Social History: Social History   Socioeconomic History   Marital status: Married    Spouse name: Not on file   Number of children: 2   Years of education: Not on file   Highest education level: Not on file  Occupational History   Occupation: Scientist, clinical (histocompatibility and immunogenetics): [employer]  Tobacco Use   Smoking status: Former    Packs/day: 1.00    Years: 35.00    Total pack years: 35.00    Types: Cigarettes    Quit date: 02/18/1984    Years since quitting: 38.3   Smokeless tobacco: Never  Vaping Use   Vaping Use: Never used  Substance and Sexual Activity   Alcohol use: Yes    Alcohol/week: 2.0 standard drinks of alcohol    Types: 2 Standard drinks or equivalent per week   Drug use: Never   Sexual activity: Not on file  Other Topics Concern   Not on file  Social History Narrative   Not on file   Social Determinants of Health   Financial Resource Strain: Not on file  Food Insecurity: Not on file  Transportation Needs: Not on file  Physical Activity: Not on file  Stress: Not on file  Social Connections: Not on file  Intimate Partner Violence: Not on file    Family History: Family History  Problem Relation Age of Onset   Coronary artery disease Mother    Hypertension Mother    Heart disease Mother     Review of Systems: All other systems reviewed and are otherwise negative except as noted above.   Physical Exam: Vitals:   06/20/22 0950  BP: (!) 88/0  Pulse: 86  Weight: 203 lb (92.1 kg)  Height: '6\' 3"'$  (1.905 m)     GEN- The patient is elderly appearing, alert and oriented x 3 today.   HEENT: normocephalic, atraumatic; sclera clear, conjunctiva pink; hearing intact; oropharynx clear;  neck supple, no JVP Lymph- no cervical lymphadenopathy Lungs- Clear to ausculation bilaterally, normal work of breathing.  No wheezes, rales, rhonchi Heart- Irregularly irregular  rate and rhythm, no  murmurs, rubs or gallops, PMI not laterally displaced GI- soft, non-tender, non-distended, bowel sounds present, no hepatosplenomegaly Extremities- no clubbing or cyanosis. Trace peripheral edema; DP/PT/radial pulses 2+ bilaterally MS- no significant deformity or atrophy Skin- warm and dry, no rash or lesion; ICD pocket well healed Psych- euthymic mood, full affect Neuro- strength and sensation are intact  ICD interrogation- reviewed in detail today,  See PACEART report  EKG:  EKG is ordered today. Personal review of EKG ordered today shows AF with V Pacing at 86 bpm  Recent Labs: 02/23/2022: ALT 15; TSH 2.442 02/26/2022: Magnesium 2.2 05/23/2022: B Natriuretic Peptide 254.9; BUN 24; Creatinine, Ser 1.29; Hemoglobin 13.7; Platelets 148; Potassium 4.7; Sodium 141   Wt Readings from Last 3 Encounters:  06/02/22 205 lb 3.2 oz (93.1 kg)  05/23/22 205 lb 9.6 oz (93.3 kg)  03/11/22 202 lb 6.4 oz (91.8 kg)     Other studies Reviewed: Additional studies/ records that were reviewed today include: Previous EP office notes.   Assessment and Plan:  1.  Chronic systolic dysfunction s/p Biotronik dual chamber ICD  euvolemic today Stable on an appropriate medical regimen Normal ICD function See Pace Art report No changes today Previously taken off coreg due to orthostasis   2. Ventricular tachycardia NYHA class III.  Warm and dry on exam today. Though fatigued. Continue spironolactone Continue Imdur Continue Lasix. Labs today. Transition valsartan to once daily losartan starting Sunday with orthostasis Starting midodrine and reaching out to HF clinic for other close follow up.    3. Paroxysmal atrial fibrillation Burden 5.5% but currently in AF today.  He is a poor candidate for tikosyn or  sotalol given VT with therapy. Continue  Eliquis.  Labs today.   4. Hypotension Suspect his recent symptoms of dizziness and intermittent blurred vision are from this, as recurred when preparing to leave from clinic.  Stop valsartan. Resume as losartan 12.5 mg qhs, starting Sunday.  Start midodrine 2.5 mg BID.   Current medicines are reviewed at length with the patient today.   =  Labs/ tests ordered today include:  Orders Placed This Encounter  Procedures   Comprehensive metabolic panel   CBC   Magnesium   CUP PACEART INCLINIC DEVICE CHECK   EKG 12-Lead    Disposition:   Follow up with EP APP  1-2 weeks. Will also reach out to HF clinic for close follow up.      Jacalyn Lefevre, PA-C  06/20/2022 9:37 AM  [place] [address] [place] [address] [phone] (office) [phone] (fax)

## 2022-06-20 NOTE — Patient Instructions (Signed)
Medication Instructions:  Your physician has recommended you make the following change in your medication:   DISCONTINUE: Valsartan START: Losartan 12.'5mg'$  daily at bedtime starting on Sunday 06/22/22 START: Midodrine 2.'5mg'$  twice daily  *If you need a refill on your cardiac medications before your next appointment, please call your pharmacy*   Lab Work: TODAY: CMET, CBC, Mag  If you have labs (blood work) drawn today and your tests are completely normal, you will receive your results only by: [place] (if you have MyChart) OR A paper copy in the mail If you have any lab test that is abnormal or we need to change your treatment, we will call you to review the results.   Follow-Up: At [facility], you and your health needs are our priority.  As part of our continuing mission to provide you with exceptional heart care, we have created designated Provider Care Teams.  These Care Teams include your primary Cardiologist (physician) and Advanced Practice Providers (APPs -  Physician Assistants and Nurse Practitioners) who all work together to provide you with the care you need, when you need it.   Your next appointment:   07/07/22  Important Information About Sugar

## 2022-06-21 LAB — COMPREHENSIVE METABOLIC PANEL
ALT: 13 IU/L (ref 0–44)
AST: 20 IU/L (ref 0–40)
Albumin/Globulin Ratio: 1.3 (ref 1.2–2.2)
Albumin: 4 g/dL (ref 3.7–4.7)
Alkaline Phosphatase: 99 IU/L (ref 44–121)
BUN/Creatinine Ratio: 21 (ref 10–24)
BUN: 32 mg/dL — ABNORMAL HIGH (ref 8–27)
Bilirubin Total: 0.6 mg/dL (ref 0.0–1.2)
CO2: 26 mmol/L (ref 20–29)
Calcium: 9.8 mg/dL (ref 8.6–10.2)
Chloride: 101 mmol/L (ref 96–106)
Creatinine, Ser: 1.5 mg/dL — ABNORMAL HIGH (ref 0.76–1.27)
Globulin, Total: 3 g/dL (ref 1.5–4.5)
Glucose: 89 mg/dL (ref 70–99)
Potassium: 4.6 mmol/L (ref 3.5–5.2)
Sodium: 142 mmol/L (ref 134–144)
Total Protein: 7 g/dL (ref 6.0–8.5)
eGFR: 45 mL/min/{1.73_m2} — ABNORMAL LOW (ref >59)

## 2022-06-21 LAB — MAGNESIUM: Magnesium: 2.1 mg/dL (ref 1.6–2.3)

## 2022-06-21 LAB — CBC
Hematocrit: 41.6 % (ref 37.5–51.0)
Hemoglobin: 14.1 g/dL (ref 13.0–17.7)
MCH: 31.5 pg (ref 26.6–33.0)
MCHC: 33.9 g/dL (ref 31.5–35.7)
MCV: 93 fL (ref 79–97)
Platelets: 174 10*3/uL (ref 150–450)
RBC: 4.47 x10E6/uL (ref 4.14–5.80)
RDW: 12.2 % (ref 11.6–15.4)
WBC: 8.8 10*3/uL (ref 3.4–10.8)

## 2022-06-30 ENCOUNTER — Ambulatory Visit (INDEPENDENT_AMBULATORY_CARE_PROVIDER_SITE_OTHER): Payer: Medicare Other | Admitting: Surgery

## 2022-06-30 ENCOUNTER — Encounter: Payer: Self-pay | Admitting: Surgery

## 2022-06-30 VITALS — BP 121/76 | HR 68 | Temp 97.9°F | Resp 20 | Ht 75.0 in | Wt 204.0 lb

## 2022-06-30 DIAGNOSIS — I6523 Occlusion and stenosis of bilateral carotid arteries: Secondary | ICD-10-CM

## 2022-06-30 DIAGNOSIS — I7143 Infrarenal abdominal aortic aneurysm, without rupture: Secondary | ICD-10-CM | POA: Diagnosis not present

## 2022-06-30 NOTE — Progress Notes (Signed)
[facility] [place]  Patient name: Eric Gordon MRN: [id number] DOB: 03-12-1931 Sex: male   REQUESTING PROVIDER:    Dr. Aundra Dubin   REASON FOR CONSULT:    AAA  HISTORY OF PRESENT ILLNESS:    Eric Gordon is a 88 y.o. male with asymptomatic bilateral carotid stenosis, documented by CT scan.  On 09/13/2020 he underwent right sided TCAR.  Intraoperative findings included a 90% stenosis.  On 02/08/2021, he underwent left-sided TCAR for a 90% asymptomatic lesion   He has been followed for a abdominal aortic aneurysm which on most recent ultrasound has increased to 4.9 cm.  He denies abdominal pain or back pain.   Patient has a history of coronary artery disease status post CABG.  He has had a NSTEMI from occluded bypass grafts.  He has atrial fibrillation.  He is anticoagulated on Eliquis.  He has had a spontaneous pneumothorax requiring chest tube decompression he gets dyspneic at 150 to 200 yards.  He is medically managed for hypertension.  He takes a statin for hypercholesterolemia.  He recently had a defibrillator placed for V. tach and NYHA class III heart failure  PAST MEDICAL HISTORY    Past Medical History:  Diagnosis Date   Abdominal aortic aneurysm (AAA) (HCC)    Achilles tendinitis of right lower extremity    Arthritis    "my whole body" (03/19/2018)   Atherosclerosis of coronary artery bypass graft w/o angina pectoris    Atrial fibrillation (HCC)    Barrett's esophagus    CAD (coronary artery disease)    Carotid artery disease (HCC)    CHF (congestive heart failure) (HCC)    Chronic anticoagulation    PE   Chronic lower back pain    Dyspnea    Essential tremor    GERD (gastroesophageal reflux disease)    High cholesterol    Hyperlipidemia    Hyperplasia, prostate    Hypertension    Hypothyroidism    Myocardial infarction ([place])    2 in Jan. 2019   Nail dystrophy    OSA on CPAP    uses CPAP    Osteoarthrosis    Pneumonia    "now and once before" (03/19/2018)   Pulmonary embolism [facility])    April 2012 after CABG   S/P CABG (coronary artery bypass graft)    Sleep apnea    Spontaneous pneumothorax    left spontaneous pneumothorax/left pleural effusion, s/p chest tube 01/09/19; thoracentesis x2, s/p left VATS/tacl pleurodesis 04/12/19     FAMILY HISTORY   Family History  Problem Relation Age of Onset   Coronary artery disease Mother    Hypertension Mother    Heart disease Mother     SOCIAL HISTORY:   Social History   Socioeconomic History   Marital status: Married    Spouse name: Not on file   Number of children: 2   Years of education: Not on file   Highest education level: Not on file  Occupational History   Occupation: Scientist, clinical (histocompatibility and immunogenetics): [employer]  Tobacco Use   Smoking status: Former    Packs/day: 1.00    Years: 35.00    Total pack years: 35.00    Types: Cigarettes    Quit date: 02/18/1984    Years since quitting: 38.3   Smokeless tobacco: Never  Vaping Use   Vaping Use: Never used  Substance and Sexual Activity   Alcohol use: Yes    Alcohol/week: 2.0 standard drinks  of alcohol    Types: 2 Standard drinks or equivalent per week   Drug use: Never   Sexual activity: Not on file  Other Topics Concern   Not on file  Social History Narrative   Not on file   Social Determinants of Health   Financial Resource Strain: Not on file  Food Insecurity: Not on file  Transportation Needs: Not on file  Physical Activity: Not on file  Stress: Not on file  Social Connections: Not on file  Intimate Partner Violence: Not on file    ALLERGIES:    Allergies  Allergen Reactions   Ancef [Cefazolin Sodium] Hives   Empagliflozin Other (See Comments)    Dizziness   Vancomycin Rash    CURRENT MEDICATIONS:    Current Outpatient Medications  Medication Sig Dispense Refill   acetaminophen (TYLENOL) 500 MG tablet Take 1,000 mg by mouth every 6 (six) hours as  needed for moderate pain or headache.     apixaban (ELIQUIS) 5 MG TABS tablet Take 1 tablet (5 mg total) by mouth 2 (two) times daily. Restart after 5 days on 6/12     ASCORBIC ACID PO Take 500 mg by mouth daily.     atorvastatin (LIPITOR) 80 MG tablet Take 1 tablet (80 mg total) by mouth daily. 30 tablet 6   Carboxymethylcellulose Sod PF 0.5 % SOLN Place 1 drop into both eyes See admin instructions. Instill 1 drop into both eyes two to four times a day as needed for dryness     Cholecalciferol (VITAMIN D-3) 25 MCG (1000 UT) CAPS Take 1,000 Units by mouth daily.     fexofenadine (ALLEGRA) 180 MG tablet Take 180 mg by mouth daily.     furosemide (LASIX) 40 MG tablet Take 0.5 tablets (20 mg total) by mouth as needed. 30 tablet 3   isosorbide mononitrate (IMDUR) 30 MG 24 hr tablet Take 1 tablet (30 mg total) by mouth daily.     levothyroxine (SYNTHROID) 50 MCG tablet Take 50 mcg by mouth daily before breakfast.     losartan (COZAAR) 25 MG tablet Take 0.5 tablets (12.5 mg total) by mouth at bedtime. 45 tablet 3   midodrine (PROAMATINE) 2.5 MG tablet Take 1 tablet (2.5 mg total) by mouth 2 (two) times daily with a meal. 180 tablet 3   Multiple Vitamins-Minerals (CENTRUM SILVER PO) Take 1 tablet by mouth See admin instructions. Take 1 tablet by mouth every other day with breakfast     pantoprazole (PROTONIX) 40 MG tablet Take 40 mg by mouth daily before breakfast.     spironolactone (ALDACTONE) 25 MG tablet Take 1 tablet (25 mg total) by mouth at bedtime. 90 tablet 3   tamsulosin (FLOMAX) 0.4 MG CAPS capsule Take 0.4 mg by mouth every evening.     traMADol (ULTRAM) 50 MG tablet Take 1 tablet (50 mg total) by mouth every 6 (six) hours as needed for moderate pain. 20 tablet 0   No current facility-administered medications for this visit.    REVIEW OF SYSTEMS:   '[X]'$  denotes positive finding, '[ ]'$  denotes negative finding Cardiac  Comments:  Chest pain or chest pressure:    Shortness of breath upon  exertion:    Short of breath when lying flat:    Irregular heart rhythm:        Vascular    Pain in calf, thigh, or hip brought on by ambulation:    Pain in feet at night that wakes you up from your sleep:  Blood clot in your veins:    Leg swelling:         Pulmonary    Oxygen at home:    Productive cough:     Wheezing:         Neurologic    Sudden weakness in arms or legs:     Sudden numbness in arms or legs:     Sudden onset of difficulty speaking or slurred speech:    Temporary loss of vision in one eye:     Problems with dizziness:         Gastrointestinal    Blood in stool:      Vomited blood:         Genitourinary    Burning when urinating:     Blood in urine:        Psychiatric    Major depression:         Hematologic    Bleeding problems:    Problems with blood clotting too easily:        Skin    Rashes or ulcers:        Constitutional    Fever or chills:     PHYSICAL EXAM:   Vitals:   06/30/22 1347  BP: 121/76  Pulse: 68  Resp: 20  Temp: 97.9 F (36.6 C)  SpO2: 96%  Weight: 204 lb (92.5 kg)  Height: '6\' 3"'$  (1.905 m)    GENERAL: The patient is a well-nourished male, in no acute distress. The vital signs are documented above. CARDIAC: There is a regular rate and rhythm.  PULMONARY: Nonlabored respirations ABDOMEN: Soft and non-tender  MUSCULOSKELETAL: There are no major deformities or cyanosis. NEUROLOGIC: No focal weakness or paresthesias are detected. SKIN: There are no ulcers or rashes noted. PSYCHIATRIC: The patient has a normal affect.  STUDIES:   I have reviewed the following: Abdominal Aorta Findings:  +-------------+-------+----------+----------+----------+--------+--------+  Location     AP (cm)Trans (cm)PSV (cm/s)Waveform  ThrombusComments  +-------------+-------+----------+----------+----------+--------+--------+  Proximal     2.90   2.90      46        biphasic                     +-------------+-------+----------+----------+----------+--------+--------+  Mid          3.50   3.70      33        monophasic        fusiform  +-------------+-------+----------+----------+----------+--------+--------+  Distal       4.80   4.90      74        monophasic        fusiform  +-------------+-------+----------+----------+----------+--------+--------+  RT CIA Prox  1.4    1.3       83        biphasic                    +-------------+-------+----------+----------+----------+--------+--------+  RT CIA Distal                 82        biphasic                    +-------------+-------+----------+----------+----------+--------+--------+  RT EIA Prox  1.3    1.1       103       biphasic                    +-------------+-------+----------+----------+----------+--------+--------+  RT EIA  Distal                 59        biphasic                    +-------------+-------+----------+----------+----------+--------+--------+  LT CIA Prox  1.4    1.2       68        biphasic                    +-------------+-------+----------+----------+----------+--------+--------+  LT CIA Distal                 117       biphasic                    +-------------+-------+----------+----------+----------+--------+--------+  LT EIA Prox  1.4    1.4       107       biphasic                    +-------------+-------+----------+----------+----------+--------+--------+  LT EIA Distal                 76        biphasic                    +-------------+-------+----------+----------+----------+--------+--------+  ASSESSMENT and PLAN   AAA: Maximum diameter on ultrasound is 4.9 cm.  I compared this to a CT scan earlier in the summer which was done for trauma and these correlate.  Last year it measured 4.5 cm in the year before it was 4.1 cm.  I think he is getting close to needing to get this repaired.  In addition he is very anxious  about this since his father and 2 uncles died with a ruptured aneurysm.  I believe the neck step is to get a CT angiogram for surgical planning and have him return afterwards.  I will also get ABIs at that time.  Carotid: His most recent ultrasound was in April 2023.  He will need to have another surveillance study in April 2024   Leia Alf, MD, FACS [facility] [phone] Pager [phone]

## 2022-07-06 NOTE — Progress Notes (Addendum)
Cardiology Office Note Date:  07/06/2022  Patient ID:  Eric Gordon, Eric Gordon May 15, 1931, MRN [id number] PCP:  Shon Baton, MD  Cardiologist:  Dr. Aundra Dubin Electrophysiologist: Dr. Quentin Ore    Chief Complaint:  planned early f/u  History of Present Illness: Eric Gordon is a 88 y.o. male with history of essential tremor, asthma, HTN, CKD (III), CAD (CABG), AFib, chronic CHF (systolic), VT, PVD (carotid) (right TCAR, 2021, left TCAR 02/08/21, follows with Dr. Trula Slade), AAA (lso followed by Dr. Trula Slade)  He was admitted in 09/2018 with NSTEMI.  LHC showed new occlusion of the branch of SVG-ramus and OM that touched down on OM.  There were also serial 70% stenoses in the mid/distal RCA.  There was not thought to be an interventional option and patient was treated medically. He was discharged home but presented a couple days later with recurrent chest pain and dyspnea.  Cath was repeated showing no change from prior.  This admission, he had FFR of the RCA which did not suggest hemodynamic significance.  Echo showed EF 55-60%, normal RV.  CTA did not show a PE. He was noted to be volume overloaded and was diuresed   Admitted again 4/20, he was admitted with left spontaneous PTX requiring chest tube.  After this, he developed recurrent left pleural effusions requiring thoracentesis x 3 so far.  Pleural fluid was exudative with eosinophil predominance.  With elevated ESR, there was concern that amiodarone could be involved, so this medication was stopped.  CT chest in 6/20 showed LLL consolidation/collapse with small left effusion, there was a rim-enhancing lesion posterior to the heart between esophagus and left atrium, ?loculated fluid.  TEE was done in 7/20 and confirmed loculated pleural effusion behind the left atrium.  In 7/20, patient had VATS on the left.   Admitted 6/23 with syncope, no evidence for ACS. Echo EF 40-45% and pre-existing aneurysm of the basal inferolateral wall, mild-mod  AS. EP study with inducible VT. Underwent dual chamber Biotronik ICD placement  He saw Dr. Aundra Dubin 05/23/22, suspect his dyspnea primarily related to his lung disease, not found to be volume OL, GDMT limited b  He saw Dr. Quentin Ore 06/02/22, his BP was low that AM at home, no symptoms and better in the clinic, ICD site was stable, AF burden 10.6%, planned for an annual visit  He saw Jonni Sanger 06/20/22 after getting appropriate ICD therapy (ATP for VT), he was feeling poorly fatigued, lightheaded, worse after his med. Discussed prior discussion of perhaps needing Midodrine. His Imdur, lasix/spiro, losartan to 12.69m daily (to start Sunday), and started on midodrine. He did have recurrent symptoms in the clinic c/w low BP, this felt to be driver of his weakness, lightheadedness. NOT a sotalol or Tikosyn candidate for his Afib w/VT He was in [place] this day  Labs/lytes, CBC done and stable  He saw Dr. BTrula Slade10/9/23, AAA, CT in the summer was 4.9, felt getting close to need repair.  Planned to repeat his CT for surgical planning and ABIs BP was 121/76  Reviewed with Dr. [place]undra Dubin [place] ARB > BB, felt add on mexiletine was also reasonable, did not feel a new ischemic evaluation would be of benefit. Dr. LQuentin Oresuggested Toprol 245mdaily and mexiletine 15041mID with early follow up to ensure tolerated  TODAY He is accompanied by his wife. He is MUCH better, far less dizzy with minimal lightheadedness only on occasion. No CP No near syncope or syncope. DOE is at his  baseline No bleeding or signs of bleeding  He never started the losartan. Home BPs have been 110's-140/60's-80s  He asks about getting back to the [facility], enjoys his exercise there, does not do any heavy lifting or overly strenuous exercises  Device information Biotronik dual chamber ICD implanted 02/25/2022 Secondary prevention Hx of syncope with inducible VT by EPS + appropriate therapy (ATP for VT)  AFIB Diagnosed Jan  2020  AAD Hx Off Ranolazine 2/2 QT prolongation Amiodarone (For AFib) stopped April 2020, concerns of pulm tox  Past Medical History:  Diagnosis Date   Abdominal aortic aneurysm (AAA) ([place])    Achilles tendinitis of right lower extremity    Arthritis    "my whole body" (03/19/2018)   Atherosclerosis of coronary artery bypass graft w/o angina pectoris    Atrial fibrillation (HCC)    Barrett's esophagus    CAD (coronary artery disease)    Carotid artery disease (HCC)    CHF (congestive heart failure) (HCC)    Chronic anticoagulation    PE   Chronic lower back pain    Dyspnea    Essential tremor    GERD (gastroesophageal reflux disease)    High cholesterol    Hyperlipidemia    Hyperplasia, prostate    Hypertension    Hypothyroidism    Myocardial infarction ([place])    2 in Jan. 2019   Nail dystrophy    OSA on CPAP    uses CPAP   Osteoarthrosis    Pneumonia    "now and once before" (03/19/2018)   Pulmonary embolism [facility])    April 2012 after CABG   S/P CABG (coronary artery bypass graft)    Sleep apnea    Spontaneous pneumothorax    left spontaneous pneumothorax/left pleural effusion, s/p chest tube 01/09/19; thoracentesis x2, s/p left VATS/tacl pleurodesis 04/12/19    Past Surgical History:  Procedure Laterality Date   ACHILLES TENDON REPAIR Bilateral    2006 & 2004   CARDIAC CATHETERIZATION  11/2010   CORONARY ANGIOGRAPHY N/A 10/22/2018   Procedure: CORONARY ANGIOGRAPHY;  Surgeon: Troy Sine, MD;  Location: [place] CV LAB;  Service: Cardiovascular;  Laterality: N/A;   CORONARY ARTERY BYPASS GRAFT  March 2012   CABG X 5   ESOPHAGOGASTRODUODENOSCOPY (EGD) WITH PROPOFOL N/A 12/10/2020   Procedure: ESOPHAGOGASTRODUODENOSCOPY (EGD) WITH PROPOFOL;  Surgeon: Doran Stabler, MD;  Location: [facility];  Service: Gastroenterology;  Laterality: N/A;   ICD IMPLANT N/A 02/25/2022   Procedure: ICD IMPLANT;  Surgeon: Vickie Epley, MD;  Location: [place] [facility];   Service: Cardiovascular;  Laterality: N/A;   INGUINAL HERNIA REPAIR Left 1980   INTRAVASCULAR PRESSURE WIRE/FFR STUDY N/A 10/22/2018   Procedure: INTRAVASCULAR PRESSURE WIRE/FFR STUDY;  Surgeon: Troy Sine, MD;  Location: [place] [facility];  Service: Cardiovascular;  Laterality: N/A;   IR THORACENTESIS ASP PLEURAL SPACE W/IMG GUIDE  02/04/2019   IR THORACENTESIS ASP PLEURAL SPACE W/IMG GUIDE  02/24/2019   KNEE ARTHROSCOPY Left 2006   LEFT HEART CATH AND CORS/GRAFTS ANGIOGRAPHY N/A 10/18/2018   Procedure: LEFT HEART CATH AND CORS/GRAFTS ANGIOGRAPHY;  Surgeon: Lorretta Harp, MD;  Location: [place] [facility];  Service: Cardiovascular;  Laterality: N/A;   LEFT HEART CATH AND CORS/GRAFTS ANGIOGRAPHY N/A 10/21/2018   Procedure: LEFT HEART CATH AND CORS/GRAFTS ANGIOGRAPHY;  Surgeon: Troy Sine, MD;  Location: [place] [facility];  Service: Cardiovascular;  Laterality: N/A;   PACEMAKER IMPLANT N/A 02/24/2022   Procedure: PACEMAKER IMPLANT;  Surgeon: Deboraha Sprang, MD;  Location: [place] [facility];  Service: Cardiovascular;  Laterality: N/A;   PENILE PROSTHESIS IMPLANT     PLEURAL BIOPSY Left 04/12/2019   Procedure: Pleural Biopsy;  Surgeon: Grace Isaac, MD;  Location: [place];  Service: Thoracic;  Laterality: Left;   PLEURAL EFFUSION DRAINAGE Left 04/12/2019   Procedure: Drainage Of Pleural Effusion;  Surgeon: Grace Isaac, MD;  Location: [place];  Service: Thoracic;  Laterality: Left;   RIGHT HEART CATHETERIZATION N/A 11/14/2013   Procedure: RIGHT HEART CATH;  Surgeon: Larey Dresser, MD;  Location: [facility] CATH LAB;  Service: Cardiovascular;  Laterality: N/A;   SHOULDER OPEN ROTATOR CUFF REPAIR Right 2003   TALC PLEURODESIS Left 04/12/2019   Procedure: Pietro Cassis;  Surgeon: Grace Isaac, MD;  Location: [place];  Service: Thoracic;  Laterality: Left;   TEE WITHOUT CARDIOVERSION N/A 03/24/2019   Procedure: TRANSESOPHAGEAL ECHOCARDIOGRAM (TEE);  Surgeon: Larey Dresser, MD;   Location: [facility] ENDOSCOPY;  Service: Cardiovascular;  Laterality: N/A;   TEE WITHOUT CARDIOVERSION  04/12/2019   Procedure: Transesophageal Echocardiogram (Tee);  Surgeon: Grace Isaac, MD;  [facility];  Service: Thoracic;;   THORACOSCOPY Left 04/12/2019   VIDEO BRONCHOSCOPY (N/A ) VIDEO ASSISTED THORACOSCOPY (Left Chest) TALC PLEURADESIS (Left    TRANSCAROTID ARTERY REVASCULARIZATION  Right 09/13/2020   Procedure: RIGHT TRANSCAROTID ARTERY REVASCULARIZATION USING 12m X 453mENROUTE TRANSCAROTID STEND SYSTEM;  Surgeon: BrSerafina MitchellMD;  [place] Service: V[facility];  Laterality: Right;   TRANSCAROTID ARTERY REVASCULARIZATION  Left 02/08/2021   Procedure: LEFT TRANSCAROTID ARTERY REVASCULARIZATION;  Surgeon: BrSerafina MitchellMD;  [place] [facility] Service: V[place];  Laterality: Left;   ULTRASOUND GUIDANCE FOR VASCULAR ACCESS  09/13/2020   Procedure: ULTRASOUND GUIDANCE FOR VASCULAR ACCESS;  Surgeon: BrSerafina MitchellMD;  [place] [facility]: V[facility];;   VIDEO ASSISTED THORACOSCOPY Left 04/12/2019   Procedure: VIDEO ASSISTED THORACOSCOPY;  Surgeon: GeGrace IsaacMD;  [facility] Service: Thoracic;  Laterality: Left;   VIDEO BRONCHOSCOPY N/A 04/12/2019   Procedure: VIDEO BRONCHOSCOPY;  Surgeon: GeGrace IsaacMD;  [facility];  Service: Thoracic;  Laterality: N/A;    Current Outpatient Medications  Medication Sig Dispense Refill   acetaminophen (TYLENOL) 500 MG tablet Take 1,000 mg by mouth every 6 (six) hours as needed for moderate pain or headache.     apixaban (ELIQUIS) 5 MG TABS tablet Take 1 tablet (5 mg total) by mouth 2 (two) times daily. Restart after 5 days on 6/12     ASCORBIC ACID PO Take 500 mg by mouth daily.     atorvastatin (LIPITOR) 80 MG tablet Take 1 tablet (80 mg total) by mouth daily. 30 tablet 6   Carboxymethylcellulose Sod PF 0.5 % SOLN Place 1 drop into both eyes See admin instructions. Instill 1 drop into both eyes two to  four times a day as needed for dryness     Cholecalciferol (VITAMIN D-3) 25 MCG (1000 UT) CAPS Take 1,000 Units by mouth daily.     fexofenadine (ALLEGRA) 180 MG tablet Take 180 mg by mouth daily.     furosemide (LASIX) 40 MG tablet Take 0.5 tablets (20 mg total) by mouth as needed. 30 tablet 3   isosorbide mononitrate (IMDUR) 30 MG 24 hr tablet Take 1 tablet (30 mg total) by mouth daily.     levothyroxine (SYNTHROID) 50 MCG tablet Take 50 mcg by mouth daily before breakfast.  losartan (COZAAR) 25 MG tablet Take 0.5 tablets (12.5 mg total) by mouth at bedtime. 45 tablet 3   midodrine (PROAMATINE) 2.5 MG tablet Take 1 tablet (2.5 mg total) by mouth 2 (two) times daily with a meal. 180 tablet 3   Multiple Vitamins-Minerals (CENTRUM SILVER PO) Take 1 tablet by mouth See admin instructions. Take 1 tablet by mouth every other day with breakfast     pantoprazole (PROTONIX) 40 MG tablet Take 40 mg by mouth daily before breakfast.     spironolactone (ALDACTONE) 25 MG tablet Take 1 tablet (25 mg total) by mouth at bedtime. 90 tablet 3   tamsulosin (FLOMAX) 0.4 MG CAPS capsule Take 0.4 mg by mouth every evening.     traMADol (ULTRAM) 50 MG tablet Take 1 tablet (50 mg total) by mouth every 6 (six) hours as needed for moderate pain. 20 tablet 0   No current facility-administered medications for this visit.    Allergies:   Ancef [cefazolin sodium], Empagliflozin, and Vancomycin   Social History:  The patient  reports that he quit smoking about 38 years ago. His smoking use included cigarettes. He has a 35.00 pack-year smoking history. He has never used smokeless tobacco. He reports current alcohol use of about 2.0 standard drinks of alcohol per week. He reports that he does not use drugs.   Family History:  The patient's family history includes Coronary artery disease in his mother; Heart disease in his mother; Hypertension in his mother.  ROS:  Please see the history of present illness.    All other  systems are reviewed and otherwise negative.   PHYSICAL EXAM:  VS:  There were no vitals taken for this visit. BMI: There is no height or weight on file to calculate BMI. Well nourished, well developed, in no acute distress HEENT: normocephalic, atraumatic Neck: no JVD, carotid bruits or masses Cardiac:  RRR; no significant murmurs, no rubs, or gallops Lungs:  CTA b/l, no wheezing, rhonchi or rales Abd: soft, nontender MS: no deformity or atrophy Ext: wearing compression hose, trace edema is appreciated Skin: warm and dry, no rash Neuro:  No gross deficits appreciated Psych: euthymic mood, full affect  ICD site is stable, no tethering or discomfort   EKG:  not done today  Device interrogation done today and reviewed by myself:  Battery and lead measurements are good One NSVT is a PAT + ATR, burden is 17.3% AS/VS today VP 4%   02/23/22: TTE (limited)  1. There is a small aneurysm of the basal inferolateral wall. Thrombus is  not seen. Left ventricular ejection fraction, by estimation, is 40 to 45%.  The left ventricle has mildly decreased function. The left ventricle  demonstrates regional wall motion  abnormalities (see scoring diagram/findings for description). There is  mild concentric left ventricular hypertrophy. Left ventricular diastolic  function could not be evaluated.   2. Right ventricular systolic function is normal. The right ventricular  size is normal. There is normal pulmonary artery systolic pressure. The  estimated right ventricular systolic pressure is 15.9 mmHg.   3. 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 A small  partly mobile calcified component may be present. The mitral valve is  degenerative. No evidence of mitral stenosis. Moderate to severe mitral  annular calcification.   4. Tricuspid valve regurgitation is mild to moderate.   5. The aortic valve is tricuspid. There is moderate calcification of the  aortic valve. There is  moderate thickening of the aortic  valve. Aortic  valve regurgitation is not visualized. Mild to moderate aortic valve  stenosis.   6. There is borderline dilatation of the ascending aorta, measuring 39  mm.   7. The inferior vena cava is normal in size with greater than 50%  respiratory variability, suggesting right atrial pressure of 3 mmHg.    11/21/21: stress myoview   Findings are consistent with prior myocardial infarction. The study is high risk.   There were ST depressions in the anterior leads and frequent brief runs of NSVT as well as isolated PVCs during stress.   LV perfusion is abnormal. There is a medium sized, severe fixed defect in the basal-to-apical inferior and inferolateral LV walls. There is a small fixed defect in the basal-to-mid anterolateral LV wall. There is abnormal wall motion in the defect area. Consistent with infarction.   Left ventricular function is abnormal. Nuclear stress EF: 37 %. The left ventricular ejection fraction is moderately decreased (30-44%). End diastolic cavity size is moderately enlarged.   Prior study available for comparison from 08/28/2020. Compared to prior study, the inferior and inferolateral defect is now seen in the apical segments as well as the basal-to-mid segments. There is also a basal-to-mid anterolateral fixed defect.   10/22/2018: LHC Dist RCA lesion is 70% stenosed. Mid RCA lesion is 70% stenosed.   Coronary angiography of the native RCA demonstrated smooth 30% narrowing in the mid and distal segments on the LAO projection; however on the RAO projections the lesions are eccentric and on the RAO caudal projection the stenoses appeared 75%.   DFR was insignificant at 0.93.  FFR was not felt to be significant at 0.90 beyond the distal lesion and 0.89 beyond the mid lesion.   RECOMMENDATION: Medical therapy in this patient who is status post recent non-ST segment elevation MI with continue dual antiplatelet therapy, and concomitant  CAD with an occluded distal limb of a previously placed circumflex graft as noted on the initial studies.  High potency statin therapy with target LDL less than 70.   10/21/2018; LHC Ost 1st Diag lesion is 70% stenosed. Ost 1st Mrg lesion is 80% stenosed. Ost Cx to Prox Cx lesion is 50% stenosed. Mid RCA lesion is 70% stenosed. Dist RCA lesion is 70% stenosed. Ost Ramus lesion is 100% stenosed. Origin lesion is 100% stenosed. Origin lesion is 100% stenosed. Origin to Mid Graft lesion between Ramus and 3rd Mrg is 100% stenosed. Prox LAD lesion is 25% stenosed.   Multivessel native CAD with proximal irregularity of the LAD, 70% proximal diagonal stenosis with competitive filling of the distal LAD via the LIMA graft; occluded intermediate vessel at its origin with 80% stenosis in a small bifurcating marginal branch of the circumflex; and native RCA disease with 60 to 70% mid stenosis and focal  70% distal stenosis.   Patent LIMA to LAD.   Patent proximal graft to the marginal intermediate like vessel with occluded sequential limb to the distal circumflex.   Occluded vein graft which had supplied the diagonal vessel.   Occluded vein graft which had supplied the PDA.   LVEDP 20 mm.   RECOMMENDATION: Increase medical therapy trial.  We will continue with aspirin/Plavix.  Will attempt to add low-dose carvedilol at 3.125 mg twice a day to the isosorbide.  Since patient was still having residual chest discomfort, low-dose IV nitroglycerin was started in the Cath Lab.  Depending upon blood pressure, consider possible low-dose amlodipine.  High potency statin therapy with target LDL less than  70.   Recent Labs: 02/23/2022: TSH 2.442 05/23/2022: B Natriuretic Peptide 254.9 06/20/2022: ALT 13; BUN 32; Creatinine, Ser 1.50; Hemoglobin 14.1; Magnesium 2.1; Platelets 174; Potassium 4.6; Sodium 142  02/18/2022: Cholesterol 113; HDL 39; LDL Cholesterol 62; Total CHOL/HDL Ratio 2.9; Triglycerides 62; VLDL  12   Estimated Creatinine Clearance: 40.7 mL/min (A) (by C-G formula based on SCr of 1.5 mg/dL (H)).   Wt Readings from Last 3 Encounters:  06/30/22 204 lb (92.5 kg)  06/20/22 203 lb (92.1 kg)  06/02/22 205 lb 3.2 oz (93.1 kg)     Other studies reviewed: Additional studies/records reviewed today include: summarized above  ASSESSMENT AND PLAN:  ICD Intact function No programming changes made  VT Not recurrent Start Toprol 79m to take in the evenings Mexiletine 1568mBID   Discussed rational at length today for the medication additions They were advised if he gets low BPs/lightheaded again to stop the Metoprolol.  He was aware, no driving rule  ADDEND: 07/18/22 Mexiletine is not his insurance company's preferred medication Pt insurance requiring additional documentation why he can not have sotalol or Tikosyn, Amiodarone, Propranolol, Flecainide, or Quinidine.  Tikosyn and flecainide inappropriate and not indicated for treatment of VT Sotalol is contraindicated in pt with chronic CHF He has previously failed amiodarone Neither Propanolol or quinidine are appropriate for management of his VT ReTommye StandardPA-C   CAD No anginal symptoms  ICM Chronic CHF (systolic) He appears euvolemic Thoracic impedance looks good  PVD b/l TCAR  AAA Dr. BrTrula Sladeollows  7.  Paroxysmal AFib/AFlutter (probably atypical, difficult to tell) CHA2DS2Vasc is at least 5, on Eliquis 17% burden Adding Toprol as above Last Creat borderline for his Eliquis dose, recheck today   Disposition: F/u with Dr. <CRickard Patiences scheduled, sooner with us/him if needed  Current medicines are reviewed at length with the patient today.  The patient did not have any concerns regarding medicines.  SiVenetia NightPA-C 07/06/2022 9:13 AM     [place] [phone])  [phone])

## 2022-07-07 ENCOUNTER — Encounter: Payer: Self-pay | Admitting: Physician Assistant

## 2022-07-07 ENCOUNTER — Ambulatory Visit: Payer: Medicare Other | Attending: Physician Assistant | Admitting: Physician Assistant

## 2022-07-07 VITALS — BP 110/60 | HR 76 | Ht 72.0 in | Wt 208.8 lb

## 2022-07-07 DIAGNOSIS — Z95810 Presence of automatic (implantable) cardiac defibrillator: Secondary | ICD-10-CM | POA: Diagnosis not present

## 2022-07-07 DIAGNOSIS — I5022 Chronic systolic (congestive) heart failure: Secondary | ICD-10-CM | POA: Diagnosis not present

## 2022-07-07 DIAGNOSIS — I480 Paroxysmal atrial fibrillation: Secondary | ICD-10-CM | POA: Insufficient documentation

## 2022-07-07 DIAGNOSIS — Z79899 Other long term (current) drug therapy: Secondary | ICD-10-CM | POA: Insufficient documentation

## 2022-07-07 DIAGNOSIS — I255 Ischemic cardiomyopathy: Secondary | ICD-10-CM | POA: Diagnosis not present

## 2022-07-07 DIAGNOSIS — I472 Ventricular tachycardia, unspecified: Secondary | ICD-10-CM | POA: Diagnosis not present

## 2022-07-07 DIAGNOSIS — I951 Orthostatic hypotension: Secondary | ICD-10-CM | POA: Insufficient documentation

## 2022-07-07 DIAGNOSIS — I2510 Atherosclerotic heart disease of native coronary artery without angina pectoris: Secondary | ICD-10-CM | POA: Insufficient documentation

## 2022-07-07 LAB — CUP PACEART INCLINIC DEVICE CHECK
Date Time Interrogation Session: 20231016172025
Implantable Lead Implant Date: 20230606
Implantable Lead Implant Date: 20230606
Implantable Lead Location: 753859
Implantable Lead Location: 753860
Implantable Lead Model: 377
Implantable Lead Model: 402266
Implantable Lead Serial Number: 8000807784
Implantable Lead Serial Number: 81503989
Implantable Pulse Generator Implant Date: 20230606
Lead Channel Pacing Threshold Amplitude: 0.5 V
Lead Channel Pacing Threshold Amplitude: 1 V
Lead Channel Pacing Threshold Pulse Width: 0.4 ms
Lead Channel Pacing Threshold Pulse Width: 0.4 ms
Lead Channel Sensing Intrinsic Amplitude: 1.8 mV
Lead Channel Sensing Intrinsic Amplitude: 9.2 mV
Pulse Gen Model: 429534
Pulse Gen Serial Number: 84900693

## 2022-07-07 MED ORDER — MEXILETINE HCL 150 MG PO CAPS: 150.0000 mg | ORAL_CAPSULE | Freq: Two times a day (BID) | ORAL | 2 refills | Status: DC

## 2022-07-07 MED ORDER — METOPROLOL SUCCINATE ER 25 MG PO TB24: 25.0000 mg | ORAL_TABLET | Freq: Every day | ORAL | 2 refills | Status: DC

## 2022-07-07 MED ORDER — MIDODRINE HCL 2.5 MG PO TABS: 2.5000 mg | ORAL_TABLET | Freq: Two times a day (BID) | ORAL | 3 refills | Status: DC

## 2022-07-07 NOTE — Patient Instructions (Addendum)
Medication Instructions:   START TAKING:  MEXILETINE 150 MG TWICE A DAY   TOPROL  XL  25 MG ONCE A DAY IN THE EVENING    STOP TAKING AND REMOVE THIS MEDICATION FROM YOUR MEDICATION LIST: LOSARTAN    *If you need a refill on your cardiac medications before your next appointment, please call your pharmacy*   Lab Work:  BMET TODAY   If you have labs (blood work) drawn today and your tests are completely normal, you will receive your results only by: [place] (if you have MyChart) OR A paper copy in the mail If you have any lab test that is abnormal or we need to change your treatment, we will call you to review the results.   Testing/Procedures: NONE ORDERED  TODAY     Follow-Up: At [facility], you and your health needs are our priority.  As part of our continuing mission to provide you with exceptional heart care, we have created designated Provider Care Teams.  These Care Teams include your primary Cardiologist (physician) and Advanced Practice Providers (APPs -  Physician Assistants and Nurse Practitioners) who all work together to provide you with the care you need, when you need it.  We recommend signing up for the patient portal called "MyChart".  Sign up information is provided on this After Visit Summary.  MyChart is used to connect with patients for Virtual Visits (Telemedicine).  Patients are able to view lab/test results, encounter notes, upcoming appointments, etc.  Non-urgent messages can be sent to your provider as well.   To learn more about what you can do with MyChart, go to [email or web address].    Your next appointment:   AS SCHEDULED WITH DR [facility]   The format for your next appointment:   In Person  Provider:  AS SCHEDULED   Other Instructions   Important Information About Sugar

## 2022-07-08 LAB — BASIC METABOLIC PANEL
BUN/Creatinine Ratio: 19 (ref 10–24)
BUN: 24 mg/dL (ref 8–27)
CO2: 28 mmol/L (ref 20–29)
Calcium: 9.3 mg/dL (ref 8.6–10.2)
Chloride: 104 mmol/L (ref 96–106)
Creatinine, Ser: 1.26 mg/dL (ref 0.76–1.27)
Glucose: 90 mg/dL (ref 70–99)
Potassium: 4.4 mmol/L (ref 3.5–5.2)
Sodium: 143 mmol/L (ref 134–144)
eGFR: 55 mL/min/{1.73_m2} — ABNORMAL LOW (ref >59)

## 2022-07-10 ENCOUNTER — Other Ambulatory Visit: Payer: Self-pay | Admitting: *Deleted

## 2022-07-10 DIAGNOSIS — I7143 Infrarenal abdominal aortic aneurysm, without rupture: Secondary | ICD-10-CM

## 2022-07-14 ENCOUNTER — Other Ambulatory Visit: Payer: Self-pay | Admitting: *Deleted

## 2022-07-15 ENCOUNTER — Telehealth: Payer: Self-pay

## 2022-07-15 NOTE — Telephone Encounter (Signed)
**Note De-Identified Eric Gordon Obfuscation** The pt had an office visit with Tommye Standard, PA-c on 07/07/22 and was started on Mexiletine '150mg'$  BID for VT  We received a Non formulary Mexiletine prior authorization form received from [facility].  Preferred alternatives are: Flecainide, Propafenone, Dofetilide, Quinidine, and Amiodarone. Per the pts chart the only preferred medication that the pt has taken is Amiodarone.  1st question:  Contraindications to the formulary agent (select the most appropriate reason): Contraindications to the formulary agent. Adverse reactions to the formulary agent. Therapeutic failure of all formulary alternatives. No formulary alternative exists. A serious risk is associated with a change to a formulary agent.  2nd question: Why was Amiodarone (since that is the only formulary med the pt has taken) stopped?  Forwarding to Tommye Standard, PA-c for advisement.

## 2022-07-17 ENCOUNTER — Other Ambulatory Visit: Payer: Self-pay

## 2022-07-17 ENCOUNTER — Ambulatory Visit (HOSPITAL_COMMUNITY)
Admission: RE | Admit: 2022-07-17 | Discharge: 2022-07-17 | Disposition: A | Discharge status: Home / Self Care | Payer: No Typology Code available for payment source | Source: Ambulatory Visit | Attending: Cardiology | Admitting: Cardiology

## 2022-07-17 VITALS — BP 130/78 | HR 69 | Wt 207.0 lb

## 2022-07-17 DIAGNOSIS — I6523 Occlusion and stenosis of bilateral carotid arteries: Secondary | ICD-10-CM

## 2022-07-17 DIAGNOSIS — I252 Old myocardial infarction: Secondary | ICD-10-CM | POA: Diagnosis not present

## 2022-07-17 DIAGNOSIS — R700 Elevated erythrocyte sedimentation rate: Secondary | ICD-10-CM | POA: Insufficient documentation

## 2022-07-17 DIAGNOSIS — Z8679 Personal history of other diseases of the circulatory system: Secondary | ICD-10-CM | POA: Insufficient documentation

## 2022-07-17 DIAGNOSIS — E785 Hyperlipidemia, unspecified: Secondary | ICD-10-CM | POA: Diagnosis not present

## 2022-07-17 DIAGNOSIS — I951 Orthostatic hypotension: Secondary | ICD-10-CM | POA: Diagnosis not present

## 2022-07-17 DIAGNOSIS — I7143 Infrarenal abdominal aortic aneurysm, without rupture: Secondary | ICD-10-CM

## 2022-07-17 DIAGNOSIS — I5022 Chronic systolic (congestive) heart failure: Secondary | ICD-10-CM | POA: Diagnosis not present

## 2022-07-17 DIAGNOSIS — Z95810 Presence of automatic (implantable) cardiac defibrillator: Secondary | ICD-10-CM | POA: Diagnosis not present

## 2022-07-17 DIAGNOSIS — Z7901 Long term (current) use of anticoagulants: Secondary | ICD-10-CM | POA: Diagnosis not present

## 2022-07-17 DIAGNOSIS — I495 Sick sinus syndrome: Secondary | ICD-10-CM | POA: Diagnosis not present

## 2022-07-17 DIAGNOSIS — I2510 Atherosclerotic heart disease of native coronary artery without angina pectoris: Secondary | ICD-10-CM | POA: Diagnosis not present

## 2022-07-17 DIAGNOSIS — I509 Heart failure, unspecified: Secondary | ICD-10-CM | POA: Insufficient documentation

## 2022-07-17 DIAGNOSIS — I714 Abdominal aortic aneurysm, without rupture, unspecified: Secondary | ICD-10-CM | POA: Diagnosis not present

## 2022-07-17 DIAGNOSIS — I350 Nonrheumatic aortic (valve) stenosis: Secondary | ICD-10-CM | POA: Diagnosis not present

## 2022-07-17 DIAGNOSIS — I480 Paroxysmal atrial fibrillation: Secondary | ICD-10-CM | POA: Insufficient documentation

## 2022-07-17 DIAGNOSIS — I452 Bifascicular block: Secondary | ICD-10-CM | POA: Insufficient documentation

## 2022-07-17 DIAGNOSIS — R55 Syncope and collapse: Secondary | ICD-10-CM | POA: Diagnosis not present

## 2022-07-17 DIAGNOSIS — Z79899 Other long term (current) drug therapy: Secondary | ICD-10-CM | POA: Insufficient documentation

## 2022-07-17 DIAGNOSIS — Z951 Presence of aortocoronary bypass graft: Secondary | ICD-10-CM | POA: Insufficient documentation

## 2022-07-17 LAB — BRAIN NATRIURETIC PEPTIDE: B Natriuretic Peptide: 261.9 pg/mL — ABNORMAL HIGH (ref 0.0–100.0)

## 2022-07-17 LAB — BASIC METABOLIC PANEL
Anion gap: 10 (ref 5–15)
BUN: 23 mg/dL (ref 8–23)
CO2: 27 mmol/L (ref 22–32)
Calcium: 9.6 mg/dL (ref 8.9–10.3)
Chloride: 102 mmol/L (ref 98–111)
Creatinine, Ser: 1.33 mg/dL — ABNORMAL HIGH (ref 0.61–1.24)
GFR, Estimated: 51 mL/min — ABNORMAL LOW (ref >60)
Glucose, Bld: 93 mg/dL (ref 70–99)
Potassium: 4.5 mmol/L (ref 3.5–5.1)
Sodium: 139 mmol/L (ref 135–145)

## 2022-07-17 MED ORDER — ISOSORBIDE MONONITRATE ER 30 MG PO TB24: 15.0000 mg | ORAL_TABLET | Freq: Every day | ORAL | Status: DC

## 2022-07-17 MED ORDER — METOPROLOL SUCCINATE ER 25 MG PO TB24: 25.0000 mg | ORAL_TABLET | Freq: Every day | ORAL | Status: DC

## 2022-07-17 MED ORDER — MEXILETINE HCL 150 MG PO CAPS: 150.0000 mg | ORAL_CAPSULE | Freq: Two times a day (BID) | ORAL | 2 refills | Status: DC

## 2022-07-17 MED ORDER — FUROSEMIDE 20 MG PO TABS: 20.0000 mg | ORAL_TABLET | Freq: Every day | ORAL | 3 refills | Status: DC

## 2022-07-17 NOTE — Patient Instructions (Signed)
Decrease Imdur to '15mg'$  (1/2 Tab) daily.  Please restart your Mexiletine 150 mg Twice daily  Labs done today, your results will be available in MyChart, we will contact you for abnormal readings.  Your physician recommends that you schedule a follow-up appointment in: 3 months ( January 2024)  ** please call the office in [facility] November to arrange your follow up appointment. **  If you have any questions or concerns before your next appointment please send [place] a message through [place] or call our office at [phone].    TO LEAVE A MESSAGE FOR THE NURSE SELECT OPTION 2, PLEASE LEAVE A MESSAGE INCLUDING: YOUR NAME DATE OF BIRTH CALL BACK NUMBER REASON FOR CALL**this is important as we prioritize the call backs  YOU WILL RECEIVE A CALL BACK THE SAME DAY AS LONG AS YOU CALL BEFORE 4:00 PM  At the [facility], you and your health needs are our priority. As part of our continuing mission to provide you with exceptional heart care, we have created designated Provider Care Teams. These Care Teams include your primary Cardiologist (physician) and Advanced Practice Providers (APPs- Physician Assistants and Nurse Practitioners) who all work together to provide you with the care you need, when you need it.   You may see any of the following providers on your designated Care Team at your next follow up: Dr Glori Bickers Dr [name] Dr. Roxana Hires, NP Lyda Jester, [place] [facility] [place], [place] Na, NP Audry Riles, PharmD   Please be sure to bring in all your medications bottles to every appointment.

## 2022-07-17 NOTE — Progress Notes (Signed)
Advanced [facility] Failure Clinic Note  Date:  07/17/2022   ID:  Eric Gordon, DOB 1931/06/16, MRN [id number]   Provider location: [facility] Type of Visit: Established patient  PCP:  Shon Baton, MD  HF Cardiologist:  Dr. Aundra Dubin   HPI: Eric Gordon is a 88 y.o. male with history of CAD s/p CABG.  He has had trouble long-term with exertional dyspnea.  Dyspnea triggered evaluation in 2012 leading to CABG but was not resolved by CABG.  PFTs in 3/14 showed only mild obstructive airways disease and V/Q scan showed no PE.  Echo in 9/14 showed normal LV systolic function with moderately dilated RV. [place] in 2/15 showed normal right and left heart filling pressures and normal PA pressure.  Finally, he had a CPX in 3/15 that showed normal capacity compared to age-matched sedentary norms.  He was noted to have chronotropic incompetence.  At a prior appointment, I took him off metoprolol given chronotropic incompetence noted on CPX.  Dyspnea improved significantly with weight loss.  He had Cardiolite in 8/18 with EF 52%, no ischemia or infarction.    He was admitted in 1/20 with NSTEMI.  LHC showed new occlusion of the branch of SVG-ramus and OM that touched down on OM.  There were also serial 70% stenoses in the mid/distal RCA.  There was not thought to be an interventional option and patient was treated medically. He was discharged home but presented a couple days later with recurrent chest pain and dyspnea.  Cath was repeated showing no change from prior.  This admission, he had FFR of the RCA which did not suggest hemodynamic significance.  Echo showed EF 55-60%, normal RV.  CTA did not show a PE. He was noted to be volume overloaded and was diuresed.  He was also noted to be in atrial fibrillation with RVR transiently.  ASA/Plavix was stopped and Eliquis was started. He was discharged to SNF.  Atrial fibrillation recurred and he was started on amiodarone with conversion  back to NSR.   In 4/20, he was admitted with left spontaneous PTX requiring chest tube.  After this, he developed recurrent left pleural effusions requiring thoracentesis x 3 so far.  Pleural fluid was exudative with eosinophil predominance.  With elevated ESR, there was concern that amiodarone could be involved, so this medication was stopped.  CT chest in 6/20 showed LLL consolidation/collapse with small left effusion, there was a rim-enhancing lesion posterior to the heart between esophagus and left atrium, ?loculated fluid.  TEE was done in 7/20 and confirmed loculated pleural effusion behind the left atrium.  In 7/20, patient had VATS on the left.   Carotid dopplers in 9/21 showed 80-99% RICA stenosis, 50-93% LICA.  AAA [place] in 9/21 showed 4.5 cm AAA. Patient was referred to Dr. Trula Slade for evaluation => he has had TCAR on right in 12/21.  He had left TCAR in 5/22.   Cardiolite in 12/21 with EF 41%, no ischemia, fixed inferolateral defect. Echo in 1/22 showed EF 40-45%, basal to mid inferolateral AK, basal inferior HK, normal RV, mild aortic stenosis mean gradient 11 mmHg.  He was unable to tolerate dapagliflozin and is off it. He was lightheaded on low dose Entresto so this was stopped.   Zio monitor in 1/23 showed 25% atrial flutter.  He was referred to EP, by the time he saw EP, burden of atrial fibrillation was lower.   Cardiolite in 3/23 showed prior inferolateral and  inferior infarct, no ischemia.   Patient was admitted in 4/23 with atypical chest pain.  This likely was due to cough/bronchitis.  CTA chest showed inferolateral wall pseudoaneurysm, this is chronic.  Echo in 4/23 showed EF 40-45%, akinesis of the inferolateral wall, basal inferolateral pseudoaneurysm, moderate RV enlargement, normal RV systolic function, mild AS with mean gradient 10 mmHg.   Zio monitor in 5/23 showed 2% AF burden, improved from prior.     Follow up 5/23, stable NYHA II, euvolemic. Off several meds due to  orthostatic symptoms.  Admitted 6/23 with syncope, no evidence for ACS. 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 EP study with inducible VT. Underwent dual chamber Biotronik ICD placement. Discharged home, weight 208 lbs.  VT again in 9/23, treated with ATP.  Mexiletine recommended by EP.   Patient returns for HF followup.  Due to ongoing orthostatic symptoms, he is off ARB and is on low dose midodrine 2.5 mg bid.  He still has occasional episodes of positional lightheadedness but is improved.  He continues to have rare episodes of atypical chest pain.  He reports bendopnea, no orthopnea/PND.  He continues to have exertional dyspnea, generally ok walking around house but gets short of breath walking longer distances.  Weight up 2 lbs.   ECG (personally reviewed): a-paced, RBBB, LAFB.    Labs (2/15): K 4.2, creatinine 1.2, LDL 87, HDL 39, BNP 71 Labs (4/15): K 3.9, creatinine 1.1 Labs (12/15): LDL 73, HDL 37, K 3.9, creatinine 1.0 Labs (12/16): LDL 71, HDL 49 Labs (3/17): K 4.2, creatinine 1.28, HCT 45 Labs (8/18): LDL 82, HDL 41, TSH normal, hgb 16.3, K 4.1, creatinine 1.05 Labs (2/20): LFTs normal, pro BNP 842 Labs (3/20): K 3.8, creatinine 1.48, TSH mildly elevated at 6.97, free T3 and free T4 normal Labs (4/20): K 3.9, creatinine 1.36 Labs (2/20): LDL 52 Labs (8/20): BNP 241, creatinine 1.21  Labs (9/20): BNP 244, K 4, creatinine 1.12 Labs (2/21): K 4.3, creatinine 1.26, BNP 271 Labs (6/21): K 4.2, creatinine 1.11, LDL 57 Labs (6/22): K 4.3, creatinine 1.16, LDL 49 Labs (8/22): K 4.3, creatinine 1.37 Labs (12/22): K 3.9, creatinine 1.18 Labs (4/23): K 4.7, creatinine 1.35, BNP 253 Labs (6/23): K 4.1, creatinine 0.95 => 1.28 Labs (10/23): K 4.4, creatinine 1.26   PMH: 1. Essential tremor 2. CAD: s/p CABG in 3/12.   - Cardiolite (8/18): EF 52%, no ischemia/infarction.  - NSTEMI (1/20):  LHC showed new occlusion of the branch of  SVG-ramus and OM that touched down on OM.  There were also serial 70% stenoses in the mid/distal RCA.  FFR of RCA was negative.  - Cardiolite (12/21): EF 41%, no ischemia, fixed inferolateral defect - Cardiolite (3/23): LV EF 37%, fixed basal-mid inferolateral/inferior defect, no ischemia.  3. Atrial fibrillation: Only noted post-op CABG.  4. PE: Post-op CABG in 2012.  5. AAA: 3.6 cm on [place] in 3/14.  3.6 cm on [place] in 3/15. 3.6 cm on [place] 8/16. 4.1 cm on [place] 4/17.  - AAA [place] (5/18): 4.1 cm AAA.  - AAA [place] (5/19): 4.1 cm AAA.  - AAA [place] (4/20): 4.1 cm AAA.  - AAA [place] (9/21): 4.5 cm AAA - AAA [place] (9/22): 4.5 cm AAA - AAA [place] (10/23): 4.9 cm AAA 6. OSA: On CPAP.  7. Carotid stenosis: Carotid dopplers (3/88) with 82-80% LICA stenosis.  Carotid dopplers (0/34) with 91-79% LICA stenosis. Carotid dopplers (3/16) with 60-79% RCIA stenosis, 15-05% LICA  stenosis.  - Carotid dopplers (8/16) with 40-59% RICA stenosis, 34-74% LICA stenosis.  - Carotid dopplers (8/17) with 25-95% RICA, 63-87% LICA. - Carotid dopplers (9/18) with 56-43% RICA, 32-95% LICA.   - Carotid dopplers (9/19) with 18-84% RICA, 16-60% LICA.  - Carotid dopplers (9/20) with 60-79% BICA - Carotid dopplers (9/21): 63-01% RICA, 60-10% LICA - Right TCAR 93/23, left TCAR 5/22.  - Carotid dopplers (4/23): patent ICA stents.  8. Asthma 9. PNA x 2 10. Chronic diastolic CHF/dyspnea: Echo (9/14) with EF 60-65%, mild LVH, very mild AS with mean gradient 10 mmHg, RV moderately dilated.  PFTs (3/14) showed mild obstructive airways disease.  V/Q scan (3/14) with no PE.  ENT workup for upper respiratory causes was negative.  RHC (2/15) with mean RA 7, PA 32/12, mean PCWP 13, CI 3.18.  CPX (3/15) with peak VO2 16.4, VE/VCO2 33; normal when compared to age-matched sedentary normals; chronotropic incompetence was noted.  Dyspnea was improved with weight loss.  - Echo (8/17): EF 60-65%, mild AS.  - Echo (1/20): EF 55-60%, normal RV size and systolic function.  -  Echo (6/20): EF 55%, mild LVH, mild AS, normal RV size and systolic function, ?LA mass or mass impinging on posterior LA.  - TEE (7/20): EF 50-55%, mild LVH, basal inferolateral aneurysm, normal RV size/systolic function, loculated pleural fluid behind LA.  - Echo (1/22): EF 40-45%, basal to mid inferolateral AK, basal inferior HK, normal RV, mild aortic stenosis mean gradient 11 mmHg. - Echo (2/23): EF 45-50%, moderate LVH, normal RV, mild AS mean gradient 15 mmHg with IVC normal.  - Echo (4/23): EF 40-45%, akinesis of the inferolateral wall, basal inferolateral pseudoaneurysm, moderate RV enlargement, normal RV systolic function, mild AS with mean gradient 10 mmHg.  - Echo (6/23): EF 40-45% and pre-existing aneurysm of the basal inferolateral wall, mild-mod AS. 11. HTN 12. Aortic stenosis: Mild.  13. Ascending aortic aneurysm: CTA chest with 4.4 cm ascending aorta in 1/20.  - 4.2 cm ascending aorta on CT chest 6/20.  14. Atrial fibrillation: Paroxysmal.  15. CKD: Stage 3.  16. Left lung spontaneous PTX then recurrent left pleural effusion.  - Left VATS in 7/20.  17. Palpitations:  - Zio patch (6/20): NSR with 1 short NSVT run and few short SVT runs, no atrial fibrillation.  - Zio patch 2 wks (2/21): 6 short NSVT runs, 66 short SVT runs, rare PACs/PVCs.  - Zio patch (5/23): 2% AF burden, 4 NSVT runs longest 5 beats.  69. COVID-19 infection 4/22.  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  20. VT: Syncope in 6/23 with inducible VT. s/p dual chamber Biotronik ICD  Current Outpatient Medications  Medication Sig Dispense Refill   acetaminophen (TYLENOL) 500 MG tablet Take 1,000 mg by mouth every 6 (six) hours as needed for moderate pain or headache.     apixaban (ELIQUIS) 5 MG TABS tablet Take 1 tablet (5 mg total) by mouth 2 (two) times daily. Restart after 5 days on 6/12     ASCORBIC ACID PO Take 500 mg by mouth daily.     atorvastatin (LIPITOR) 80 MG tablet Take 1 tablet (80 mg total) by  mouth daily. 30 tablet 6   Carboxymethylcellulose Sod PF 0.5 % SOLN Place 1 drop into both eyes See admin instructions. Instill 1 drop into both eyes two to four times a day as needed for dryness     Cholecalciferol (VITAMIN D-3) 25 MCG (1000 UT) CAPS Take 1,000 Units by mouth daily.  fexofenadine (ALLEGRA) 180 MG tablet Take 180 mg by mouth daily.     levothyroxine (SYNTHROID) 50 MCG tablet Take 50 mcg by mouth daily before breakfast.     metoprolol succinate (TOPROL XL) 25 MG 24 hr tablet Take 1 tablet (25 mg total) by mouth daily.     midodrine (PROAMATINE) 2.5 MG tablet Take 1 tablet (2.5 mg total) by mouth 2 (two) times daily with a meal. 180 tablet 3   Multiple Vitamins-Minerals (CENTRUM SILVER PO) Take 1 tablet by mouth See admin instructions. Take 1 tablet by mouth every other day with breakfast     pantoprazole (PROTONIX) 40 MG tablet Take 40 mg by mouth daily before breakfast.     spironolactone (ALDACTONE) 25 MG tablet Take 1 tablet (25 mg total) by mouth at bedtime. 90 tablet 3   tamsulosin (FLOMAX) 0.4 MG CAPS capsule Take 0.4 mg by mouth every evening.     traMADol (ULTRAM) 50 MG tablet Take 1 tablet (50 mg total) by mouth every 6 (six) hours as needed for moderate pain. 20 tablet 0   furosemide (LASIX) 20 MG tablet Take 1 tablet (20 mg total) by mouth daily. 90 tablet 3   isosorbide mononitrate (IMDUR) 30 MG 24 hr tablet Take 0.5 tablets (15 mg total) by mouth daily.     mexiletine (MEXITIL) 150 MG capsule Take 1 capsule (150 mg total) by mouth 2 (two) times daily. 180 capsule 2   No current facility-administered medications for this encounter.    Allergies:   Ancef [cefazolin sodium], Empagliflozin, and Vancomycin   Social History:  The patient  reports that he quit smoking about 38 years ago. His smoking use included cigarettes. He has a 35.00 pack-year smoking history. He has never used smokeless tobacco. He reports current alcohol use of about 2.0 standard drinks of alcohol  per week. He reports that he does not use drugs.   Family History:  The patient's family history includes Coronary artery disease in his mother; Heart disease in his mother; Hypertension in his mother.   ROS:  Please see the history of present illness.   All other systems are personally reviewed and negative.   Recent Labs: 02/23/2022: TSH 2.442 06/20/2022: ALT 13; Hemoglobin 14.1; Magnesium 2.1; Platelets 174 07/17/2022: B Natriuretic Peptide 261.9; BUN 23; Creatinine, Ser 1.33; Potassium 4.5; Sodium 139  Personally reviewed   BP 130/78   Pulse 69   Wt 93.9 kg (207 lb)   SpO2 97%   BMI 28.07 kg/m   Wt Readings from Last 3 Encounters:  07/17/22 93.9 kg (207 lb)  07/07/22 94.7 kg (208 lb 12.8 oz)  06/30/22 92.5 kg (204 lb)    Physical Exam:   General: NAD Neck: No JVD, no thyromegaly or thyroid nodule.  Lungs: Clear to auscultation bilaterally with normal respiratory effort. CV: Nondisplaced PMI.  Heart regular S1/S2, no S3/S4, 1/6 SEM RUSB.  Trace ankle edema.  No carotid bruit.  Normal pedal pulses.  Abdomen: Soft, nontender, no hepatosplenomegaly, no distention.  Skin: Intact without lesions or rashes.  Neurologic: Alert and oriented x 3.  Psych: Normal affect. Extremities: No clubbing or cyanosis.  HEENT: Normal.   Assessment & Plan: 1. Syncope: 6/23, most likely cardiogenic. Known tachy-brady syndrome with outpatient monitor showing post-conversion (AF => NSR) symptomatic pauses, up to 3 sec long. Has bifascicular block.  Echo (6/23) EF 40-45% and pre-existing aneurysm of the basal inferolateral wall, mild-mod AS. EP study in 6/23 w/ inducible VT. Biotronik ICD placed.  -  No further events. 2. CAD: s/p CABG 3/12. NSTEMI 1/20, LHC showed new occlusion of the branch of SVG-ramus and OM that touched down on OM.  There were also serial 70% stenoses in the mid/distal RCA.  FFR of RCA was negative.  No interventional target.  Cardiolite in 12/21 showed inferolateral infarction with  no ischemia, consistent with findings on NSTEMI in 1/20.  Cardiolite in 3/23 showed inferior/inferolateral fixed defect with no ischemia.  Rare atypical chest pain.  Syncope in 6/23 was not associated with ACS.  - No ASA given Eliquis use.  - Continue atorvastatin 80 daily.   - Off ranolazine with prolonged QTc.  - With recent orthostatic symptoms and minimal chest pain, decrease Imdur to 15 mg daily and can stop if no worsening of chest pain.  3. Hyperlipidemia: Continue statin. 4. Chronic HF with mid range EF: TEE in 7/20 showed EF 50-55%, basal inferolateral aneurysm, and normal RV.  Echo in 1/22 with EF 40-45%, basal-mid inferolateral akinesis and inferior hypokinesis, normal RV.  Echo in 6/23 showed EF 40-45% and pre-existing aneurysm of the basal inferolateral wall, mild-mod AS.  NYHA class III symptoms.  He is not volume overloaded today on exam.  GDMT has been very limited by orthostatic symptoms. I suspect deconditioning and lung disease (recurrent left effusion and h/o PTX with likely scarring) play a role.  - Continue Toprol XL 25 mg daily.   - Off ARB due to orthostatic hypotension. - Continue Lasix 20 mg daily, would not increase as he does not look volume overloaded. BMET/BNP.  - Continue spironolactone 25 mg daily, take qhs to limit effect on BP during the day.   - Unable to tolerate dapaglifozin. - Can continue low dose midodrine for now with orthostatic symptoms.  - He needs to wear graded compression stockings during the day.  5. Spontaneous left PTX with recurrent left-sided pleural effusion:  He is s/p left VATS/pleurodesis in 7/20.    6. Carotid stenosis: S/p bilateral TCARs, followed at [facility].     7. AAA: 4.9 cm AAA in 10/23, followed by [facility].  Will likely need repair soon.   8. Aortic stenosis: mild-mod on echo 6/23. 9. Atrial fibrillation: Paroxysmal. He is significantly symptomatic while in atrial fibrillation. He is off amiodarone due to concern for pulmonary toxicity.  He  saw EP, did not recommend ablation. He would likely be a Tikosyn or sotalol candidate but would have to come into the hospital for it.  As he has had minimal atrial fibrillation recently, hold off Tikosyn/sotalol for now. NSR today.  - Continue Eliquis 5 mg bid. No bleeding issues. CBC today. 10. Pseudoaneurysm: Chronic PSA basal inferolateral wall.  This has been stable.  11. VT: Recurrent.  Has Biotronik ICD.  He had VT again in 9/23, not associated with chest pain/ischemic symptoms.  - Continue Toprol XL 25 mg daily.  - I agree with EP that mexiletine 150 mg bid is a reasonable next step, I asked him to start this.   Followup 3 months.   [name] 07/17/2022

## 2022-07-21 NOTE — Telephone Encounter (Signed)
**Note De-Identified [employer] Obfuscation** Mexiletine prior authorization form completed with notes attached supporting the pts need for Mexiletine over their formulary medications (Amiodarone, Flecainide, Propafenone, Dofetilide, and Quinidine). I have faxed all to [facility].  Fax: Tx 'ok' Report CONE_EMAIL-to-Fax  Rawan Riendeau, Mardene Celeste This message was sent [facility], a product from [employer] [email or web address]                    -------Fax Transmission Report-------  To:               Recipient at [phone] Subject:          Fw: Mexiletine Prior Authorization Result:           The transmission was successful. Explanation:      All Pages Ok Pages Sent:       17 Connect Time:     6 minutes, 17 seconds Transmit Time:    07/21/2022 13:14 Transfer Rate:    [id number] Status Code:      0000 Retry Count:      0 Job Id:           [id number] Unique Id:        [id number] Fax Line:         38 Fax Server:       [email or web address]

## 2022-07-24 ENCOUNTER — Ambulatory Visit (HOSPITAL_COMMUNITY)
Admission: RE | Admit: 2022-07-24 | Discharge: 2022-07-24 | Disposition: A | Discharge status: Home / Self Care | Payer: No Typology Code available for payment source | Source: Ambulatory Visit | Attending: Surgery | Admitting: Surgery

## 2022-07-24 DIAGNOSIS — I6523 Occlusion and stenosis of bilateral carotid arteries: Secondary | ICD-10-CM | POA: Diagnosis present

## 2022-07-24 DIAGNOSIS — I7143 Infrarenal abdominal aortic aneurysm, without rupture: Secondary | ICD-10-CM | POA: Diagnosis present

## 2022-07-24 MED ORDER — IOHEXOL 350 MG/ML SOLN
120.0000 mL | Freq: Once | INTRAVENOUS | Status: AC | PRN
Start: 2022-07-24 — End: 2022-07-24
  Administered 2022-07-24: 120 mL via INTRAVENOUS

## 2022-07-28 ENCOUNTER — Ambulatory Visit (HOSPITAL_COMMUNITY)
Admission: RE | Admit: 2022-07-28 | Discharge: 2022-07-28 | Disposition: A | Discharge status: Home / Self Care | Payer: Medicare Other | Source: Ambulatory Visit | Attending: Surgery | Admitting: Surgery

## 2022-07-28 ENCOUNTER — Encounter: Payer: Self-pay | Admitting: Surgery

## 2022-07-28 ENCOUNTER — Ambulatory Visit (INDEPENDENT_AMBULATORY_CARE_PROVIDER_SITE_OTHER): Payer: Medicare Other | Admitting: Surgery

## 2022-07-28 VITALS — BP 134/84 | HR 57 | Temp 97.8°F | Resp 20 | Ht 72.0 in | Wt 205.0 lb

## 2022-07-28 DIAGNOSIS — I7143 Infrarenal abdominal aortic aneurysm, without rupture: Secondary | ICD-10-CM

## 2022-07-28 DIAGNOSIS — I255 Ischemic cardiomyopathy: Secondary | ICD-10-CM

## 2022-07-28 NOTE — H&P (View-Only) (Signed)
[facility] [place]  Patient name: Eric Gordon MRN: [id number] DOB: 02/10/1931 Sex: male   REASON FOR VISIT:    Follow up  HISOTRY OF PRESENT ILLNESS:    Eric Gordon is a 88 y.o. male with asymptomatic bilateral carotid stenosis, documented by CT scan.  On 09/13/2020 he underwent right sided TCAR.  Intraoperative findings included a 90% stenosis.  On 02/08/2021, he underwent left-sided TCAR for a 90% asymptomatic lesion   He has been followed for a abdominal aortic aneurysm which on most recent ultrasound has increased to 4.9 cm.  He denies abdominal pain or back pain.  He is back today for review of his CT scan.   Patient has a history of coronary artery disease status post CABG.  He has had a NSTEMI from occluded bypass grafts.  He has atrial fibrillation.  He is anticoagulated on Eliquis.  He has had a spontaneous pneumothorax requiring chest tube decompression he gets dyspneic at 150 to 200 yards.  He is medically managed for hypertension.  He takes a statin for hypercholesterolemia.  He recently had a defibrillator placed for V. tach and NYHA class III heart failure     PAST MEDICAL HISTORY:   Past Medical History:  Diagnosis Date   Abdominal aortic aneurysm (AAA) (HCC)    Achilles tendinitis of right lower extremity    Arthritis    "my whole body" (03/19/2018)   Atherosclerosis of coronary artery bypass graft w/o angina pectoris    Atrial fibrillation (HCC)    Barrett's esophagus    CAD (coronary artery disease)    Carotid artery disease (HCC)    CHF (congestive heart failure) (HCC)    Chronic anticoagulation    PE   Chronic lower back pain    Dyspnea    Essential tremor    GERD (gastroesophageal reflux disease)    High cholesterol    Hyperlipidemia    Hyperplasia, prostate    Hypertension    Hypothyroidism    Myocardial infarction ([place])    2 in Jan. 2019   Nail dystrophy    OSA on CPAP     uses CPAP   Osteoarthrosis    Pneumonia    "now and once before" (03/19/2018)   Pulmonary embolism [facility])    April 2012 after CABG   S/P CABG (coronary artery bypass graft)    Sleep apnea    Spontaneous pneumothorax    left spontaneous pneumothorax/left pleural effusion, s/p chest tube 01/09/19; thoracentesis x2, s/p left VATS/tacl pleurodesis 04/12/19     FAMILY HISTORY:   Family History  Problem Relation Age of Onset   Coronary artery disease Mother    Hypertension Mother    Heart disease Mother     SOCIAL HISTORY:   Social History   Tobacco Use   Smoking status: Former    Packs/day: 1.00    Years: 35.00    Total pack years: 35.00    Types: Cigarettes    Quit date: 02/18/1984    Years since quitting: 38.4   Smokeless tobacco: Never  Substance Use Topics   Alcohol use: Yes    Alcohol/week: 2.0 standard drinks of alcohol    Types: 2 Standard drinks or equivalent per week     ALLERGIES:   Allergies  Allergen Reactions   Ancef [Cefazolin Sodium] Hives   Empagliflozin Other (See Comments)    Dizziness   Vancomycin Rash     CURRENT MEDICATIONS:   Current Outpatient Medications  Medication Sig Dispense Refill   acetaminophen (TYLENOL) 500 MG tablet Take 1,000 mg by mouth every 6 (six) hours as needed for moderate pain or headache.     apixaban (ELIQUIS) 5 MG TABS tablet Take 1 tablet (5 mg total) by mouth 2 (two) times daily. Restart after 5 days on 6/12     ASCORBIC ACID PO Take 500 mg by mouth daily.     atorvastatin (LIPITOR) 80 MG tablet Take 1 tablet (80 mg total) by mouth daily. 30 tablet 6   Carboxymethylcellulose Sod PF 0.5 % SOLN Place 1 drop into both eyes See admin instructions. Instill 1 drop into both eyes two to four times a day as needed for dryness     Cholecalciferol (VITAMIN D-3) 25 MCG (1000 UT) CAPS Take 1,000 Units by mouth daily.     fexofenadine (ALLEGRA) 180 MG tablet Take 180 mg by mouth daily.     furosemide (LASIX) 20 MG tablet Take  1 tablet (20 mg total) by mouth daily. 90 tablet 3   isosorbide mononitrate (IMDUR) 30 MG 24 hr tablet Take 0.5 tablets (15 mg total) by mouth daily.     levothyroxine (SYNTHROID) 50 MCG tablet Take 50 mcg by mouth daily before breakfast.     metoprolol succinate (TOPROL XL) 25 MG 24 hr tablet Take 1 tablet (25 mg total) by mouth daily.     mexiletine (MEXITIL) 150 MG capsule Take 1 capsule (150 mg total) by mouth 2 (two) times daily. 180 capsule 2   midodrine (PROAMATINE) 2.5 MG tablet Take 1 tablet (2.5 mg total) by mouth 2 (two) times daily with a meal. 180 tablet 3   Multiple Vitamins-Minerals (CENTRUM SILVER PO) Take 1 tablet by mouth See admin instructions. Take 1 tablet by mouth every other day with breakfast     pantoprazole (PROTONIX) 40 MG tablet Take 40 mg by mouth daily before breakfast.     spironolactone (ALDACTONE) 25 MG tablet Take 1 tablet (25 mg total) by mouth at bedtime. 90 tablet 3   tamsulosin (FLOMAX) 0.4 MG CAPS capsule Take 0.4 mg by mouth every evening.     traMADol (ULTRAM) 50 MG tablet Take 1 tablet (50 mg total) by mouth every 6 (six) hours as needed for moderate pain. 20 tablet 0   No current facility-administered medications for this visit.    REVIEW OF SYSTEMS:   '[X]'$  denotes positive finding, '[ ]'$  denotes negative finding Cardiac  Comments:  Chest pain or chest pressure:    Shortness of breath upon exertion:    Short of breath when lying flat:    Irregular heart rhythm:        Vascular    Pain in calf, thigh, or hip brought on by ambulation:    Pain in feet at night that wakes you up from your sleep:     Blood clot in your veins:    Leg swelling:         Pulmonary    Oxygen at home:    Productive cough:     Wheezing:         Neurologic    Sudden weakness in arms or legs:     Sudden numbness in arms or legs:     Sudden onset of difficulty speaking or slurred speech:    Temporary loss of vision in one eye:     Problems with dizziness:          Gastrointestinal    Blood in stool:  Vomited blood:         Genitourinary    Burning when urinating:     Blood in urine:        Psychiatric    Major depression:         Hematologic    Bleeding problems:    Problems with blood clotting too easily:        Skin    Rashes or ulcers:        Constitutional    Fever or chills:      PHYSICAL EXAM:   Vitals:   07/28/22 1336  BP: 134/84  Pulse: (!) 57  Resp: 20  Temp: 97.8 F (36.6 C)  SpO2: 94%  Weight: 205 lb (93 kg)  Height: 6' (1.829 m)    GENERAL: The patient is a well-nourished male, in no acute distress. The vital signs are documented above. CARDIAC: There is a regular rate and rhythm.  PULMONARY: Non-labored respirations ABDOMEN: Soft and non-tender C  MUSCULOSKELETAL: There are no major deformities or cyanosis. NEUROLOGIC: No focal weakness or paresthesias are detected. SKIN: There are no ulcers or rashes noted. PSYCHIATRIC: The patient has a normal affect.  STUDIES:   I have reviewed his CT scan with the following findings:  VASCULAR   1. Bilobed fusiform infrarenal abdominal aortic aneurysm. The superior aneurysmal component has a maximal diameter of 3.4 cm while the more inferior component has a maximal diameter of 4.8 cm. Recommend follow-up every 6 months. Patient is already under the care of vascular surgery. This recommendation follows ACR consensus guidelines: White Paper of the ACR Incidental Findings Committee II on Vascular Findings. J Am Coll Radiol 2013; 10:789-794. 2. Bilateral carotid artery stents are partially imaged. There appears to be significant in stent stenosis at the distal aspect of the right carotid artery stent. 3. 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 4. Mild aneurysmal dilation of the ascending thoracic aorta at 4.0 cm. Recommend annual imaging followup by CTA or MRA.  This recommendation follows 2010 ACCF/AHA/AATS/ACR/ASA/SCA/SCAI/SIR/STS/SVM Guidelines for the Diagnosis and Management of Patients with Thoracic Aortic Disease. Circulation. 2010; 121: Z858-I502. Aortic aneurysm NOS (ICD10-I71.9) 5. Mild stenosis of the origin of the right renal artery and moderate stenosis of the origin of the left renal artery.   NON VASCULAR   1. Ancillary findings as above without significant interval change.  +-------+-----------+-----------+------------+------------+  ABI/TBIToday's ABIToday's TBIPrevious ABIPrevious TBI  +-------+-----------+-----------+------------+------------+  Right 1.25       0.72                                 +-------+-----------+-----------+------------+------------+  Left  1.26       0.72                                 +-------+-----------+-----------+------------+------------+      MEDICAL ISSUES:   AAA: I reviewed the patient CT scan with him and his wife.  By my measurements, maximum diameter remains at 4.9 cm.  The patient is having significant anxiety regarding getting this fixed because his father and 2 uncles died with a ruptured aneurysm.  I have been keeping a close watch on this, waiting for to increase over 5 cm however has been relatively stable.  However because of the anxiety this is causing  the patient in addition to his increasing age, we have decided to move forward with endovascular repair.  I discussed the details of the operation with the patient including the risks and benefits.  All questions were answered.  He will need to stop his Eliquis prior to surgery.    Leia Alf, MD, FACS [facility] [phone] Pager [phone]

## 2022-07-28 NOTE — Progress Notes (Signed)
[facility] [place]  Patient name: Eric Gordon MRN: [id number] DOB: 09/08/1931 Sex: male   REASON FOR VISIT:    Follow up  HISOTRY OF PRESENT ILLNESS:    Eric Gordon is a 88 y.o. male with asymptomatic bilateral carotid stenosis, documented by CT scan.  On 09/13/2020 he underwent right sided TCAR.  Intraoperative findings included a 90% stenosis.  On 02/08/2021, he underwent left-sided TCAR for a 90% asymptomatic lesion   He has been followed for a abdominal aortic aneurysm which on most recent ultrasound has increased to 4.9 cm.  He denies abdominal pain or back pain.  He is back today for review of his CT scan.   Patient has a history of coronary artery disease status post CABG.  He has had a NSTEMI from occluded bypass grafts.  He has atrial fibrillation.  He is anticoagulated on Eliquis.  He has had a spontaneous pneumothorax requiring chest tube decompression he gets dyspneic at 150 to 200 yards.  He is medically managed for hypertension.  He takes a statin for hypercholesterolemia.  He recently had a defibrillator placed for V. tach and NYHA class III heart failure     PAST MEDICAL HISTORY:   Past Medical History:  Diagnosis Date   Abdominal aortic aneurysm (AAA) (HCC)    Achilles tendinitis of right lower extremity    Arthritis    "my whole body" (03/19/2018)   Atherosclerosis of coronary artery bypass graft w/o angina pectoris    Atrial fibrillation (HCC)    Barrett's esophagus    CAD (coronary artery disease)    Carotid artery disease (HCC)    CHF (congestive heart failure) (HCC)    Chronic anticoagulation    PE   Chronic lower back pain    Dyspnea    Essential tremor    GERD (gastroesophageal reflux disease)    High cholesterol    Hyperlipidemia    Hyperplasia, prostate    Hypertension    Hypothyroidism    Myocardial infarction ([place])    2 in Jan. 2019   Nail dystrophy    OSA on CPAP     uses CPAP   Osteoarthrosis    Pneumonia    "now and once before" (03/19/2018)   Pulmonary embolism [facility])    April 2012 after CABG   S/P CABG (coronary artery bypass graft)    Sleep apnea    Spontaneous pneumothorax    left spontaneous pneumothorax/left pleural effusion, s/p chest tube 01/09/19; thoracentesis x2, s/p left VATS/tacl pleurodesis 04/12/19     FAMILY HISTORY:   Family History  Problem Relation Age of Onset   Coronary artery disease Mother    Hypertension Mother    Heart disease Mother     SOCIAL HISTORY:   Social History   Tobacco Use   Smoking status: Former    Packs/day: 1.00    Years: 35.00    Total pack years: 35.00    Types: Cigarettes    Quit date: 02/18/1984    Years since quitting: 38.4   Smokeless tobacco: Never  Substance Use Topics   Alcohol use: Yes    Alcohol/week: 2.0 standard drinks of alcohol    Types: 2 Standard drinks or equivalent per week     ALLERGIES:   Allergies  Allergen Reactions   Ancef [Cefazolin Sodium] Hives   Empagliflozin Other (See Comments)    Dizziness   Vancomycin Rash     CURRENT MEDICATIONS:   Current Outpatient Medications  Medication Sig Dispense Refill   acetaminophen (TYLENOL) 500 MG tablet Take 1,000 mg by mouth every 6 (six) hours as needed for moderate pain or headache.     apixaban (ELIQUIS) 5 MG TABS tablet Take 1 tablet (5 mg total) by mouth 2 (two) times daily. Restart after 5 days on 6/12     ASCORBIC ACID PO Take 500 mg by mouth daily.     atorvastatin (LIPITOR) 80 MG tablet Take 1 tablet (80 mg total) by mouth daily. 30 tablet 6   Carboxymethylcellulose Sod PF 0.5 % SOLN Place 1 drop into both eyes See admin instructions. Instill 1 drop into both eyes two to four times a day as needed for dryness     Cholecalciferol (VITAMIN D-3) 25 MCG (1000 UT) CAPS Take 1,000 Units by mouth daily.     fexofenadine (ALLEGRA) 180 MG tablet Take 180 mg by mouth daily.     furosemide (LASIX) 20 MG tablet Take  1 tablet (20 mg total) by mouth daily. 90 tablet 3   isosorbide mononitrate (IMDUR) 30 MG 24 hr tablet Take 0.5 tablets (15 mg total) by mouth daily.     levothyroxine (SYNTHROID) 50 MCG tablet Take 50 mcg by mouth daily before breakfast.     metoprolol succinate (TOPROL XL) 25 MG 24 hr tablet Take 1 tablet (25 mg total) by mouth daily.     mexiletine (MEXITIL) 150 MG capsule Take 1 capsule (150 mg total) by mouth 2 (two) times daily. 180 capsule 2   midodrine (PROAMATINE) 2.5 MG tablet Take 1 tablet (2.5 mg total) by mouth 2 (two) times daily with a meal. 180 tablet 3   Multiple Vitamins-Minerals (CENTRUM SILVER PO) Take 1 tablet by mouth See admin instructions. Take 1 tablet by mouth every other day with breakfast     pantoprazole (PROTONIX) 40 MG tablet Take 40 mg by mouth daily before breakfast.     spironolactone (ALDACTONE) 25 MG tablet Take 1 tablet (25 mg total) by mouth at bedtime. 90 tablet 3   tamsulosin (FLOMAX) 0.4 MG CAPS capsule Take 0.4 mg by mouth every evening.     traMADol (ULTRAM) 50 MG tablet Take 1 tablet (50 mg total) by mouth every 6 (six) hours as needed for moderate pain. 20 tablet 0   No current facility-administered medications for this visit.    REVIEW OF SYSTEMS:   '[X]'$  denotes positive finding, '[ ]'$  denotes negative finding Cardiac  Comments:  Chest pain or chest pressure:    Shortness of breath upon exertion:    Short of breath when lying flat:    Irregular heart rhythm:        Vascular    Pain in calf, thigh, or hip brought on by ambulation:    Pain in feet at night that wakes you up from your sleep:     Blood clot in your veins:    Leg swelling:         Pulmonary    Oxygen at home:    Productive cough:     Wheezing:         Neurologic    Sudden weakness in arms or legs:     Sudden numbness in arms or legs:     Sudden onset of difficulty speaking or slurred speech:    Temporary loss of vision in one eye:     Problems with dizziness:          Gastrointestinal    Blood in stool:  Vomited blood:         Genitourinary    Burning when urinating:     Blood in urine:        Psychiatric    Major depression:         Hematologic    Bleeding problems:    Problems with blood clotting too easily:        Skin    Rashes or ulcers:        Constitutional    Fever or chills:      PHYSICAL EXAM:   Vitals:   07/28/22 1336  BP: 134/84  Pulse: (!) 57  Resp: 20  Temp: 97.8 F (36.6 C)  SpO2: 94%  Weight: 205 lb (93 kg)  Height: 6' (1.829 m)    GENERAL: The patient is a well-nourished male, in no acute distress. The vital signs are documented above. CARDIAC: There is a regular rate and rhythm.  PULMONARY: Non-labored respirations ABDOMEN: Soft and non-tender C  MUSCULOSKELETAL: There are no major deformities or cyanosis. NEUROLOGIC: No focal weakness or paresthesias are detected. SKIN: There are no ulcers or rashes noted. PSYCHIATRIC: The patient has a normal affect.  STUDIES:   I have reviewed his CT scan with the following findings:  VASCULAR   1. Bilobed fusiform infrarenal abdominal aortic aneurysm. The superior aneurysmal component has a maximal diameter of 3.4 cm while the more inferior component has a maximal diameter of 4.8 cm. Recommend follow-up every 6 months. Patient is already under the care of vascular surgery. This recommendation follows ACR consensus guidelines: White Paper of the ACR Incidental Findings Committee II on Vascular Findings. J Am Coll Radiol 2013; 10:789-794. 2. Bilateral carotid artery stents are partially imaged. There appears to be significant in stent stenosis at the distal aspect of the right carotid artery stent. 3. 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 4. Mild aneurysmal dilation of the ascending thoracic aorta at 4.0 cm. Recommend annual imaging followup by CTA or MRA.  This recommendation follows 2010 ACCF/AHA/AATS/ACR/ASA/SCA/SCAI/SIR/STS/SVM Guidelines for the Diagnosis and Management of Patients with Thoracic Aortic Disease. Circulation. 2010; 121: S970-Y637. Aortic aneurysm NOS (ICD10-I71.9) 5. Mild stenosis of the origin of the right renal artery and moderate stenosis of the origin of the left renal artery.   NON VASCULAR   1. Ancillary findings as above without significant interval change.  +-------+-----------+-----------+------------+------------+  ABI/TBIToday's ABIToday's TBIPrevious ABIPrevious TBI  +-------+-----------+-----------+------------+------------+  Right 1.25       0.72                                 +-------+-----------+-----------+------------+------------+  Left  1.26       0.72                                 +-------+-----------+-----------+------------+------------+      MEDICAL ISSUES:   AAA: I reviewed the patient CT scan with him and his wife.  By my measurements, maximum diameter remains at 4.9 cm.  The patient is having significant anxiety regarding getting this fixed because his father and 2 uncles died with a ruptured aneurysm.  I have been keeping a close watch on this, waiting for to increase over 5 cm however has been relatively stable.  However because of the anxiety this is causing  the patient in addition to his increasing age, we have decided to move forward with endovascular repair.  I discussed the details of the operation with the patient including the risks and benefits.  All questions were answered.  He will need to stop his Eliquis prior to surgery.    Leia Alf, MD, FACS [facility] [phone] Pager [phone]

## 2022-07-29 ENCOUNTER — Telehealth: Payer: Self-pay

## 2022-07-29 NOTE — Telephone Encounter (Signed)
Received the following transmission from [employer]:  Long atrial episode detected 2 episode(s) detected between Jul 27, 2022 1:33:03 AM and Jul 28, 2022 10:59:19 AM - The programmed episode duration limit was 12h 57mn     New.  Atrial monitoring episode detected 76 atrial monitoring episode(s) detected between Jul 25, 2022 1:33:02 AM and Jul 29, 2022 1:33:02 AM     New.  Yellow status Recordings / Episode  Details for arrhythmia episodes received (all types) Details were received for 11 spontaneous episodes, which were detected between Jul 25, 2022 2:24:22 PM and Jul 28, 2022 3:39:06 PM  Patient continues to have increasing atrial burden and needs follow up on medication needs for rate control.  Last saw RJoseph Artin early October.  Continues to progress with alerts.  LM for patient to call me back to discuss. Heart failure clinic started on Mexiletine started on 10/26 by AFIB clinic.  Will set patient up with Dr. LQuentin Oreto review and discuss further with patient in office in the next couple of weeks.

## 2022-07-29 NOTE — Telephone Encounter (Signed)
The patient left a message returning nurse call.

## 2022-07-30 ENCOUNTER — Other Ambulatory Visit: Payer: Self-pay

## 2022-07-30 ENCOUNTER — Telehealth: Payer: Self-pay | Admitting: *Deleted

## 2022-07-30 DIAGNOSIS — I7143 Infrarenal abdominal aortic aneurysm, without rupture: Secondary | ICD-10-CM

## 2022-07-30 NOTE — Telephone Encounter (Signed)
LM with wife and on his cell VM that I am returning his call.

## 2022-07-30 NOTE — Telephone Encounter (Signed)
   Pre-operative Risk Assessment    Patient Name: Eric Gordon  DOB: [birth year] MRN: [id number]      Request for Surgical Clearance    Procedure:   ABDOMINAL AORTIC ENDOVASCULAR STENT GRAFT (EVAR)  Date of Surgery:  Clearance 08/06/22                                 Surgeon:  DR. [name] Surgeon's Group or Practice Name:  [facility] Phone number:  [phone] Fax number:  [phone]   Type of Clearance Requested:   - Medical  - Pharmacy:  Hold Apixaban (Eliquis)     Type of Anesthesia:  General    Additional requests/questions:    Jiles Prows   07/30/2022, 11:34 AM

## 2022-07-31 NOTE — Telephone Encounter (Signed)
Attempted to reach out to patient to advise needs apt with Dr. Quentin Ore for rate control. No answer, phone gave busy signal and unable to leave VM.

## 2022-07-31 NOTE — Telephone Encounter (Signed)
Patient recently seen by you on 07/17/22 now pending AAA stent graft on 11/15 with Dr. Trula Slade. He has an appointment with Dr Quentin Ore for increasing atrial burden on pacemaker. Is he cleared to proceed with surgery as scheduled or is additional testing/follow-up required.  Please route response to p cv div preop.  Thank you, Sharyn Lull

## 2022-07-31 NOTE — Telephone Encounter (Signed)
Patient with diagnosis of afib on Eliquis for anticoagulation.    Procedure: ABDOMINAL AORTIC ENDOVASCULAR STENT GRAFT (EVAR)  Date of procedure: 08/06/22   CHA2DS2-VASc Score = 5   This indicates a 7.2% annual risk of stroke. The patient's score is based upon: CHF History: 1 HTN History: 1 Diabetes History: 0 Stroke History: 0 Vascular Disease History: 1 Age Score: 2 Gender Score: 0      CrCl 42 ml/min  Per office protocol, patient can hold Eliquis for 2 days prior to procedure.    **This guidance is not considered finalized until pre-operative APP has relayed final recommendations.**

## 2022-07-31 NOTE — Telephone Encounter (Signed)
Protocol states that all patients followed by the Advanced Heart Failure clinic must be cleared by your team for general clearance. See notes, the Eliquis hold has been addressed.

## 2022-08-01 NOTE — Progress Notes (Signed)
[employer] rep called and [facility] messaged for device instructions.

## 2022-08-01 NOTE — Telephone Encounter (Signed)
Returning patient's call.   He is aware of his appt next week with Dr. Quentin Ore to review his device and med changes and follow up with his increased AF alerts on device.   After receiving this information, he had questions and asked that I call him back.  I have been unable to reach him in last few days and multiple messages left.   Today I left message letting him know that if he still has questions, he is welcome to call at any time otherwise we can discuss further at his appointment next week.   Thanks.

## 2022-08-01 NOTE — Progress Notes (Signed)
Surgical Instructions    Your procedure is scheduled on Wednesday, 08/06/22.  Report to [facility] Main Entrance "A" at 8:50 A.M., then check in with the Admitting office.  Call this number if you have problems the morning of surgery:  [phone]   If you have any questions prior to your surgery date call [phone]: Open Monday-Friday 8am-4pm If you experience any cold or flu symptoms such as cough, fever, chills, shortness of breath, etc. between now and your scheduled surgery, please notify us at the above number     Remember:  Do not eat or drink after midnight the night before your surgery     Take these medicines the morning of surgery with A SIP OF WATER:  atorvastatin (LIPITOR)  Carboxymethylcellulose Sod PF  fexofenadine (ALLEGRA)  isosorbide mononitrate (IMDUR)  mexiletine (MEXITIL)  midodrine (PROAMATINE)  pantoprazole (PROTONIX)   IF NEEDED: acetaminophen (TYLENOL)  traMADol (ULTRAM)   As of today, STOP taking any Aspirin (unless otherwise instructed by your surgeon) Aleve, Naproxen, Ibuprofen, Motrin, Advil, Goody's, BC's, all herbal medications, fish oil, and all vitamins.  Please hold  apixaban (ELIQUIS) for 2 days per your cardiologist instructions.       Do not wear jewelry or makeup. Do not wear lotions, powders, perfumes/cologne or deodorant. Men may shave face and neck. Do not bring valuables to the hospital. Do not wear nail polish, gel polish, artificial nails, or any other type of covering on natural nails (fingers and toes) If you have artificial nails or gel coating that need to be removed by a nail salon, please have this removed prior to surgery. Artificial nails or gel coating may interfere with anesthesia's ability to adequately monitor your vital signs.  [facility] is not responsible for any belongings or valuables.    Do NOT Smoke (Tobacco/Vaping)  24 hours prior to your procedure  If you use a CPAP at night, you may bring your mask for  your overnight stay.   Contacts, glasses, hearing aids, dentures or partials may not be worn into surgery, please bring cases for these belongings   For patients admitted to the hospital, discharge time will be determined by your treatment team.   Patients discharged the day of surgery will not be allowed to drive home, and someone needs to stay with them for 24 hours.   SURGICAL WAITING ROOM VISITATION Patients having surgery or a procedure may have no more than 2 support people in the waiting area - these visitors may rotate.   Children under the age of 62 must have an adult with them who is not the patient. If the patient needs to stay at the hospital during part of their recovery, the visitor guidelines for inpatient rooms apply. Pre-op nurse will coordinate an appropriate time for 1 support person to accompany patient in pre-op.  This support person may not rotate.   Please refer to [email or web address] for the visitor guidelines for Inpatients (after your surgery is over and you are in a regular room).    Special instructions:    Oral Hygiene is also important to reduce your risk of infection.  Remember - BRUSH YOUR TEETH THE MORNING OF SURGERY WITH YOUR REGULAR TOOTHPASTE   [facility]- Preparing For Surgery  Before surgery, you can play an important role. Because skin is not sterile, your skin needs to be as free of germs as possible. You can reduce the number of germs on your skin by washing with CHG (chlorahexidine gluconate) Soap before surgery.  CHG is an antiseptic cleaner which kills germs and bonds with the skin to continue killing germs even after washing.     Please do not use if you have an allergy to CHG or antibacterial soaps. If your skin becomes reddened/irritated stop using the CHG.  Do not shave (including legs and underarms) for at least 48 hours prior to first CHG shower. It is OK to shave your face.  Please  follow these instructions carefully.     Shower the NIGHT BEFORE SURGERY and the MORNING OF SURGERY with CHG Soap.   If you chose to wash your hair, wash your hair first as usual with your normal shampoo. After you shampoo, rinse your hair and body thoroughly to remove the shampoo.  Then [employer] and genitals (private parts) with your normal soap and rinse thoroughly to remove soap.  After that Use CHG Soap as you would any other liquid soap. You can apply CHG directly to the skin and wash gently with a scrungie or a clean washcloth.   Apply the CHG Soap to your body ONLY FROM THE NECK DOWN.  Do not use on open wounds or open sores. Avoid contact with your eyes, ears, mouth and genitals (private parts). Wash Face and genitals (private parts)  with your normal soap.   Wash thoroughly, paying special attention to the area where your surgery will be performed.  Thoroughly rinse your body with warm water from the neck down.  DO NOT shower/wash with your normal soap after using and rinsing off the CHG Soap.  Pat yourself dry with a CLEAN TOWEL.  Wear CLEAN PAJAMAS to bed the night before surgery  Place CLEAN SHEETS on your bed the night before your surgery  DO NOT SLEEP WITH PETS.   Day of Surgery: Take a shower with CHG soap. Wear Clean/Comfortable clothing the morning of surgery Do not apply any deodorants/lotions.   Remember to brush your teeth WITH YOUR REGULAR TOOTHPASTE.    If you received a COVID test during your pre-op visit, it is requested that you wear a mask when out in public, stay away from anyone that may not be feeling well, and notify your surgeon if you develop symptoms. If you have been in contact with anyone that has tested positive in the last 10 days, please notify your surgeon.    Please read over the following fact sheets that you were given. \

## 2022-08-04 ENCOUNTER — Encounter (HOSPITAL_COMMUNITY)
Admission: RE | Admit: 2022-08-04 | Discharge: 2022-08-04 | Disposition: A | Discharge status: Home / Self Care | Payer: Medicare Other | Source: Ambulatory Visit | Attending: Surgery | Admitting: Surgery

## 2022-08-04 ENCOUNTER — Other Ambulatory Visit: Payer: Self-pay

## 2022-08-04 ENCOUNTER — Encounter (HOSPITAL_COMMUNITY): Payer: Self-pay

## 2022-08-04 ENCOUNTER — Telehealth: Payer: Self-pay

## 2022-08-04 VITALS — BP 130/85 | HR 98 | Temp 97.8°F | Resp 18 | Ht 75.0 in | Wt 208.8 lb

## 2022-08-04 DIAGNOSIS — Z01818 Encounter for other preprocedural examination: Secondary | ICD-10-CM

## 2022-08-04 DIAGNOSIS — I7143 Infrarenal abdominal aortic aneurysm, without rupture: Secondary | ICD-10-CM | POA: Diagnosis not present

## 2022-08-04 DIAGNOSIS — Z01812 Encounter for preprocedural laboratory examination: Secondary | ICD-10-CM | POA: Diagnosis not present

## 2022-08-04 HISTORY — DX: Cardiac arrhythmia, unspecified: I49.9

## 2022-08-04 HISTORY — DX: Presence of automatic (implantable) cardiac defibrillator: Z95.810

## 2022-08-04 LAB — COMPREHENSIVE METABOLIC PANEL
ALT: 14 U/L (ref 0–44)
AST: 22 U/L (ref 15–41)
Albumin: 3.9 g/dL (ref 3.5–5.0)
Alkaline Phosphatase: 79 U/L (ref 38–126)
Anion gap: 10 (ref 5–15)
BUN: 28 mg/dL — ABNORMAL HIGH (ref 8–23)
CO2: 26 mmol/L (ref 22–32)
Calcium: 9.6 mg/dL (ref 8.9–10.3)
Chloride: 102 mmol/L (ref 98–111)
Creatinine, Ser: 1.59 mg/dL — ABNORMAL HIGH (ref 0.61–1.24)
GFR, Estimated: 41 mL/min — ABNORMAL LOW (ref >60)
Glucose, Bld: 97 mg/dL (ref 70–99)
Potassium: 4.2 mmol/L (ref 3.5–5.1)
Sodium: 138 mmol/L (ref 135–145)
Total Bilirubin: 0.5 mg/dL (ref 0.3–1.2)
Total Protein: 7.3 g/dL (ref 6.5–8.1)

## 2022-08-04 LAB — URINALYSIS, ROUTINE W REFLEX MICROSCOPIC
Bilirubin Urine: NEGATIVE
Glucose, UA: NEGATIVE mg/dL
Ketones, ur: NEGATIVE mg/dL
Nitrite: NEGATIVE
Protein, ur: NEGATIVE mg/dL
Specific Gravity, Urine: 1.01 (ref 1.005–1.030)
pH: 5 (ref 5.0–8.0)

## 2022-08-04 LAB — CBC
HCT: 44.2 % (ref 39.0–52.0)
Hemoglobin: 14 g/dL (ref 13.0–17.0)
MCH: 31.5 pg (ref 26.0–34.0)
MCHC: 31.7 g/dL (ref 30.0–36.0)
MCV: 99.5 fL (ref 80.0–100.0)
Platelets: 159 10*3/uL (ref 150–400)
RBC: 4.44 MIL/uL (ref 4.22–5.81)
RDW: 14.1 % (ref 11.5–15.5)
WBC: 8.6 10*3/uL (ref 4.0–10.5)
nRBC: 0 % (ref 0.0–0.2)

## 2022-08-04 LAB — TYPE AND SCREEN
ABO/RH(D): A POS
Antibody Screen: NEGATIVE

## 2022-08-04 LAB — APTT: aPTT: 29 seconds (ref 24–36)

## 2022-08-04 LAB — SURGICAL PCR SCREEN
MRSA, PCR: NEGATIVE
Staphylococcus aureus: NEGATIVE

## 2022-08-04 LAB — PROTIME-INR
INR: 1.1 (ref 0.8–1.2)
Prothrombin Time: 13.8 seconds (ref 11.4–15.2)

## 2022-08-04 NOTE — Progress Notes (Signed)
Notified office via voicemail and Dr. Trula Slade via staff message about abnormal U/A.

## 2022-08-04 NOTE — Progress Notes (Addendum)
PCP - Shon Baton Cardiologist - Aundra Dubin  PPM/ICD - ICD [employer] Device Orders - staff messaged- awaiting orders after his appt with Dr Aundra Dubin on 11/14 Rep Notified - Ruby Cola notified of arrival and surgery time and need to be present that day   Chest x-ray - n/a EKG - 07/17/22 Stress Test - 11/21/21 ECHO - 02/23/22 Cardiac Cath - 02/08/21  Sleep Study - +OSA, WEARS NIGHTLY    As of today, STOP taking any Aspirin (unless otherwise instructed by your surgeon) Aleve, Naproxen, Ibuprofen, Motrin, Advil, Goody's, BC's, all herbal medications, fish oil, and all vitamins.   Please hold  apixaban (ELIQUIS) for 2 days per your cardiologist instructions.   ERAS Protcol -no   COVID TEST- not needed   Anesthesia review: yes, review notes from visit with cardiologist on 08/05/22 and device orders. See above for ICD information  Patient denies shortness of breath, fever, cough and chest pain at PAT appointment   All instructions explained to the patient, with a verbal understanding of the material. Patient agrees to go over the instructions while at home for a better understanding. Patient also instructed to self quarantine after being tested for COVID-19. The opportunity to ask questions was provided.

## 2022-08-04 NOTE — Telephone Encounter (Signed)
I spoke with Eric Gordon at [facility] [address]  I provided a verbal order for the following medications:  Bactrim DS 800-'160mg'$  Qnty: 6 SIG: Take 1 tab by mouth 2 times daily for 3 days for UTI Refills: 0  Provider: Caryl Pina

## 2022-08-05 ENCOUNTER — Ambulatory Visit: Payer: Medicare Other | Attending: Cardiology | Admitting: Cardiology

## 2022-08-05 ENCOUNTER — Encounter: Payer: Self-pay | Admitting: Cardiology

## 2022-08-05 VITALS — BP 102/68 | HR 79 | Ht 75.0 in | Wt 206.2 lb

## 2022-08-05 DIAGNOSIS — I5022 Chronic systolic (congestive) heart failure: Secondary | ICD-10-CM | POA: Insufficient documentation

## 2022-08-05 DIAGNOSIS — Z95810 Presence of automatic (implantable) cardiac defibrillator: Secondary | ICD-10-CM | POA: Diagnosis not present

## 2022-08-05 DIAGNOSIS — I7143 Infrarenal abdominal aortic aneurysm, without rupture: Secondary | ICD-10-CM | POA: Diagnosis not present

## 2022-08-05 DIAGNOSIS — I472 Ventricular tachycardia, unspecified: Secondary | ICD-10-CM | POA: Diagnosis not present

## 2022-08-05 NOTE — Anesthesia Preprocedure Evaluation (Addendum)
Anesthesia Evaluation  Patient identified by MRN, date of birth, ID band Patient awake    Reviewed: Allergy & Precautions, NPO status , Patient's Chart, lab work & pertinent test results, reviewed documented beta blocker date and time   History of Anesthesia Complications Negative for: history of anesthetic complications  Airway Mallampati: II  TM Distance: >3 FB Neck ROM: Full    Dental no notable dental hx. (+) Dental Advisory Given   Pulmonary sleep apnea , former smoker   Pulmonary exam normal        Cardiovascular hypertension, Pt. on home beta blockers and Pt. on medications + CAD, + CABG and + Peripheral Vascular Disease  + dysrhythmias Atrial Fibrillation and Ventricular Tachycardia + Cardiac Defibrillator + Valvular Problems/Murmurs AS  Rhythm:Irregular Rate:Normal + Systolic murmurs Echo 10/8411 IMPRESSIONS     1. Poor acoustical windows limit accuracy of wall motion assessment. Left  ventricular ejection fraction, by estimation, is 40 to 45%. The left  ventricle has mildly decreased function. The left ventricle demonstrates  regional wall motion abnormalities  (see scoring diagram/findings for description). There is mild concentric  left ventricular hypertrophy. Left ventricular diastolic parameters are  consistent with Grade I diastolic dysfunction (impaired relaxation). There  is akinesis of the left  ventricular, entire inferolateral wall.   2. Right ventricular systolic function is normal. The right ventricular  size is moderately enlarged. There is normal pulmonary artery systolic  pressure. The estimated right ventricular systolic pressure is 24.4 mmHg.   3. Left atrial size was mildly dilated.   4. Right atrial size was mild to moderately dilated.   5. The mitral valve is normal in structure. Trivial mitral valve  regurgitation. No evidence of mitral stenosis. Moderate to severe mitral  annular  calcification.   6. The aortic valve is calcified. There is mild calcification of the  aortic valve. There is moderate thickening of the aortic valve. Aortic  valve regurgitation is trivial. Mild aortic valve stenosis. Aortic valve  mean gradient measures 18.0 mmHg. Aortic   valve Vmax measures 2.85 m/s.   7. Aortic dilatation noted. There is mild dilatation of the ascending  aorta, measuring 42 mm.   8. The inferior vena cava is dilated in size with <50% respiratory  variability, suggesting right atrial pressure of 15 mmHg.   Aneurysmal appearing basal posterior wall. The inferolateral wall is not  visualized adequately to comment on the basal inferolateral wall. 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     Neuro/Psych negative neurological ROS     GI/Hepatic Neg liver ROS,GERD  Medicated,,  Endo/Other  Hypothyroidism    Renal/GU negative Renal ROS     Musculoskeletal negative musculoskeletal ROS (+)    Abdominal   Peds  Hematology negative hematology ROS (+)   Anesthesia Other Findings   Reproductive/Obstetrics                             Anesthesia Physical Anesthesia Plan  ASA: 4  Anesthesia Plan: General   Post-op Pain Management: Tylenol PO (pre-op)*   Induction: Intravenous  PONV Risk Score and Plan: 2 and Ondansetron and Dexamethasone  Airway Management Planned: Oral ETT  Additional Equipment: Arterial line  Intra-op Plan:   Post-operative Plan: Extubation in OR  Informed Consent: I have reviewed the patients History and Physical, chart, labs and discussed the procedure including the risks, benefits and alternatives for the proposed anesthesia with the patient or  authorized representative who has indicated his/her understanding and acceptance.     Dental advisory given  Plan Discussed with: Anesthesiologist, CRNA and Surgeon  Anesthesia Plan Comments:         Anesthesia Quick Evaluation

## 2022-08-05 NOTE — Progress Notes (Signed)
Electrophysiology Office Follow up Visit Note:    Date:  08/05/2022   ID:  Eric Gordon, DOB Jul 04, 1931, MRN [id number]  PCP:  Shon Baton, MD  [facility] Cardiologist:  None  [facility] Electrophysiologist:  Vickie Epley, MD    Interval History:    Eric Gordon is a 88 y.o. male who presents for a follow up visit. They were last seen in clinic 07/17/2022 by Dr. Aundra Dubin. Due to ongoing orthostatic symptoms, he was off of ARB and on low dose midodrine 2.5 mg bid.  He still had occasional episodes of positional lightheadedness, DOE, and rare episodes of atypical chest pain. His ECG showed a-paced, RBBB, LAFB. With recent orthostatic symptoms and minimal chest pain, they decreased Imdur to 15 mg daily with plans to stop if no worsening chest pain. Agreed with EP that mexiletine 150 mg bid is a reasonable next step; he was asked to start this.   On 07/29/2022 Bi[employer]ron[employer] transmission was received and personally reviewed: Long atrial episode detected 2 episode(s) detected between Jul 27, 2022 1:33:03 AM and Jul 28, 2022 10:59:19 AM - The programmed episode duration limit was 12h 58mn                              New.   Atrial monitoring episode detected 76 atrial monitoring episode(s) detected between Jul 25, 2022 1:33:02 AM and Jul 29, 2022 1:33:02 AM                            New.   Yellow status Recordings / Episode            Details for arrhythmia episodes received (all types) Details were received for 11 spontaneous episodes, which were detected between Jul 25, 2022 2:24:22 PM and Jul 28, 2022 3:39:06 PM   Due to increasing atrial burden he was scheduled for follow-up today to discuss medication needs for rate control.  He also presents for preoperative clearance. He is scheduled for abdominal aortic endovascular stent graft (EVAR) on 08/06/2022 to be performed by Dr. WAnnamarie Major It is requested to hold his Apixaban.  Today, he is accompanied by a family member.  He states he is feeling better. He is tolerating mexiletine.  Yesterday he did hold his anticoagulation in anticipation of his procedure tomorrow.  He denies any palpitations, chest pain, shortness of breath, or peripheral edema. No lightheadedness, headaches, syncope, orthopnea, or PND.      Past Medical History:  Diagnosis Date   Abdominal aortic aneurysm (AAA) (HCC)    Achilles tendinitis of right lower extremity    AICD (automatic cardioverter/defibrillator) present    Arthritis    "my whole body" (03/19/2018)   Atherosclerosis of coronary artery bypass graft w/o angina pectoris    Atrial fibrillation (HCC)    Barrett's esophagus    CAD (coronary artery disease)    Carotid artery disease (HCC)    CHF (congestive heart failure) (HCC)    Chronic anticoagulation    PE   Chronic lower back pain    Dyspnea    Dysrhythmia    Essential tremor    GERD (gastroesophageal reflux disease)    High cholesterol    Hyperlipidemia    Hyperplasia, prostate    Hypertension    Hypothyroidism    Myocardial infarction ([facility]    2 in Jan. 2019   Nail dystrophy  OSA on CPAP    uses CPAP   Osteoarthrosis    Pneumonia    "now and once before" (03/19/2018)   Pulmonary embolism [facility])    April 2012 after CABG   S/P CABG (coronary artery bypass graft)    Sleep apnea    Spontaneous pneumothorax    left spontaneous pneumothorax/left pleural effusion, s/p chest tube 01/09/19; thoracentesis x2, s/p left VATS/tacl pleurodesis 04/12/19    Past Surgical History:  Procedure Laterality Date   ACHILLES TENDON REPAIR Bilateral    2006 & 2004   CARDIAC CATHETERIZATION  11/2010   CORONARY ANGIOGRAPHY N/A 10/22/2018   Procedure: CORONARY ANGIOGRAPHY;  Surgeon: Troy Sine, MD;  Location: [place] CV LAB;  Service: Cardiovascular;  Laterality: N/A;   CORONARY ARTERY BYPASS GRAFT  March 2012   CABG X 5   ESOPHAGOGASTRODUODENOSCOPY (EGD) WITH PROPOFOL N/A 12/10/2020   Procedure:  ESOPHAGOGASTRODUODENOSCOPY (EGD) WITH PROPOFOL;  Surgeon: Doran Stabler, MD;  Location: [facility];  Service: Gastroenterology;  Laterality: N/A;   ICD IMPLANT N/A 02/25/2022   Procedure: ICD IMPLANT;  Surgeon: Vickie Epley, MD;  Location: [place] CV [facility];  Service: Cardiovascular;  Laterality: N/A;   INGUINAL HERNIA REPAIR Left 1980   INTRAVASCULAR PRESSURE WIRE/FFR STUDY N/A 10/22/2018   Procedure: INTRAVASCULAR PRESSURE WIRE/FFR STUDY;  Surgeon: Troy Sine, MD;  Location: [place] [facility];  Service: Cardiovascular;  Laterality: N/A;   IR THORACENTESIS ASP PLEURAL SPACE W/IMG GUIDE  02/04/2019   IR THORACENTESIS ASP PLEURAL SPACE W/IMG GUIDE  02/24/2019   KNEE ARTHROSCOPY Left 2006   LEFT HEART CATH AND CORS/GRAFTS ANGIOGRAPHY N/A 10/18/2018   Procedure: LEFT HEART CATH AND CORS/GRAFTS ANGIOGRAPHY;  Surgeon: Lorretta Harp, MD;  Location: [place] [facility];  Service: Cardiovascular;  Laterality: N/A;   LEFT HEART CATH AND CORS/GRAFTS ANGIOGRAPHY N/A 10/21/2018   Procedure: LEFT HEART CATH AND CORS/GRAFTS ANGIOGRAPHY;  Surgeon: Troy Sine, MD;  Location: [place] CV LAB;  Service: Cardiovascular;  Laterality: N/A;   PACEMAKER IMPLANT N/A 02/24/2022   Procedure: PACEMAKER IMPLANT;  Surgeon: Deboraha Sprang, MD;  Location: [place] CV LAB;  Service: Cardiovascular;  Laterality: N/A;   PENILE PROSTHESIS IMPLANT     PLEURAL BIOPSY Left 04/12/2019   Procedure: Pleural Biopsy;  Surgeon: Grace Isaac, MD;  Location: [place];  Service: Thoracic;  Laterality: Left;   PLEURAL EFFUSION DRAINAGE Left 04/12/2019   Procedure: Drainage Of Pleural Effusion;  Surgeon: Grace Isaac, MD;  Location: [place];  Service: Thoracic;  Laterality: Left;   RIGHT HEART CATHETERIZATION N/A 11/14/2013   Procedure: RIGHT HEART CATH;  Surgeon: Larey Dresser, MD;  Location: [facility] C[facility] LAB;  Service: Cardiovascular;  Laterality: N/A;   SHOULDER OPEN ROTATOR CUFF REPAIR Right 2003   TALC  PLEURODESIS Left 04/12/2019   Procedure: Pietro Cassis;  Surgeon: Grace Isaac, MD;  Location: [place];  Service: Thoracic;  Laterality: Left;   TEE WITHOUT CARDIOVERSION N/A 03/24/2019   Procedure: TRANSESOPHAGEAL ECHOCARDIOGRAM (TEE);  Surgeon: Larey Dresser, MD;  Location: [facility] ENDOSCOPY;  Service: Cardiovascular;  Laterality: N/A;   TEE WITHOUT CARDIOVERSION  04/12/2019   Procedure: Transesophageal Echocardiogram (Tee);  Surgeon: Grace Isaac, MD;  Location: [facility];  Service: Thoracic;;   THORACOSCOPY Left 04/12/2019   VIDEO BRONCHOSCOPY (N/A ) VIDEO ASSISTED THORACOSCOPY (Left Chest) TALC PLEURADESIS (Left    TRANSCAROTID ARTERY REVASCULARIZATION  Right 09/13/2020   Procedure: RIGHT TRANSCAROTID ARTERY REVASCULARIZATION USING 68m X  56m ENROUTE TRANSCAROTID STEND SYSTEM;  Surgeon: BSerafina Mitchell MD;  [place]  Service: Vascular;  Laterality: Right;   TRANSCAROTID ARTERY REVASCULARIZATION  Left 02/08/2021   Procedure: LEFT TRANSCAROTID ARTERY REVASCULARIZATION;  Surgeon: BSerafina Mitchell MD;  [place]  Service: Vascular;  Laterality: Left;   ULTRASOUND GUIDANCE FOR VASCULAR ACCESS  09/13/2020   Procedure: ULTRASOUND GUIDANCE FOR VASCULAR ACCESS;  Surgeon: BSerafina Mitchell MD;  [place]  Service: Vascular;;   VIDEO ASSISTED THORACOSCOPY Left 04/12/2019   Procedure: VIDEO ASSISTED THORACOSCOPY;  Surgeon: GGrace Isaac MD;  [place]  Service: Thoracic;  Laterality: Left;   VIDEO BRONCHOSCOPY N/A 04/12/2019   Procedure: VIDEO BRONCHOSCOPY;  Surgeon: GGrace Isaac MD;  Location: [facility];  Service: Thoracic;  Laterality: N/A;    Current Medications: Current Meds  Medication Sig   acetaminophen (TYLENOL) 500 MG tablet Take 1,000 mg by mouth every 6 (six) hours as needed for moderate pain or headache.   apixaban (ELIQUIS) 5 MG TABS tablet Take 1 tablet (5 mg total) by mouth 2 (two) times daily. Restart after 5 days on 6/12   atorvastatin  (LIPITOR) 80 MG tablet Take 1 tablet (80 mg total) by mouth daily.   Carboxymethylcellulose Sod PF 0.5 % SOLN Place 1 drop into both eyes 4 (four) times daily as needed (dry/irritated eyes.).   Cholecalciferol (VITAMIN D-3) 25 MCG (1000 UT) CAPS Take 1,000 Units by mouth in the morning.   fexofenadine (ALLEGRA) 180 MG tablet Take 180 mg by mouth in the morning.   furosemide (LASIX) 20 MG tablet Take 1 tablet (20 mg total) by mouth daily.   isosorbide mononitrate (IMDUR) 30 MG 24 hr tablet Take 0.5 tablets (15 mg total) by mouth daily.   levothyroxine (SYNTHROID) 50 MCG tablet Take 50 mcg by mouth at bedtime.   metoprolol succinate (TOPROL XL) 25 MG 24 hr tablet Take 1 tablet (25 mg total) by mouth daily.   mexiletine (MEXITIL) 150 MG capsule Take 1 capsule (150 mg total) by mouth 2 (two) times daily.   midodrine (PROAMATINE) 2.5 MG tablet Take 1 tablet (2.5 mg total) by mouth 2 (two) times daily with a meal.   Multiple Vitamin (MULTIVITAMIN WITH MINERALS) TABS tablet Take 1 tablet by mouth in the morning.   pantoprazole (PROTONIX) 40 MG tablet Take 40 mg by mouth daily before breakfast.   spironolactone (ALDACTONE) 25 MG tablet Take 1 tablet (25 mg total) by mouth at bedtime.   sulfamethoxazole-trimethoprim (BACTRIM DS) 800-160 MG tablet Take 1 tablet by mouth 2 (two) times daily.   tamsulosin (FLOMAX) 0.4 MG CAPS capsule Take 0.4 mg by mouth every evening.   traMADol (ULTRAM) 50 MG tablet Take 1 tablet (50 mg total) by mouth every 6 (six) hours as needed for moderate pain.     Allergies:   Ancef [cefazolin sodium], Empagliflozin, and Vancomycin   Social History   Socioeconomic History   Marital status: Married    Spouse name: Not on file   Number of children: 2   Years of education: Not on file   Highest education level: Not on file  Occupational History   Occupation: SScientist, clinical (histocompatibility and immunogenetics) [employer]  Tobacco Use   Smoking status: Former    Packs/day: 1.00    Years: 35.00    Total pack  years: 35.00    Types: Cigarettes    Quit date: 02/18/1984    Years since quitting: 38.4   Smokeless  tobacco: Never  Vaping Use   Vaping Use: Never used  Substance and Sexual Activity   Alcohol use: Yes    Alcohol/week: 2.0 standard drinks of alcohol    Types: 2 Standard drinks or equivalent per week   Drug use: Never   Sexual activity: Not on file  Other Topics Concern   Not on file  Social History Narrative   Not on file   Social Determinants of Health   Financial Resource Strain: Not on file  Food Insecurity: Not on file  Transportation Needs: Not on file  Physical Activity: Not on file  Stress: Not on file  Social Connections: Not on file     Family History: The patient's family history includes Coronary artery disease in his mother; Heart disease in his mother; Hypertension in his mother.  ROS:   Please see the history of present illness.    All other systems reviewed and are negative.  EKGs/Labs/Other Studies Reviewed:    The following studies were reviewed today:  08/05/2022  In clinic device interrogation personally reviewed: Battery longevity 100% Lead parameters stable Atrial arrhythmia burden 42.3% Ap 21% Vp 8%    Recent Labs: 02/23/2022: TSH 2.442 06/20/2022: Magnesium 2.1 07/17/2022: B Natriuretic Peptide 261.9 08/04/2022: ALT 14; BUN 28; Creatinine, Ser 1.59; Hemoglobin 14.0; Platelets 159; Potassium 4.2; Sodium 138   Recent Lipid Panel    Component Value Date/Time   CHOL 113 02/18/2022 0920   TRIG 62 02/18/2022 0920   HDL 39 (L) 02/18/2022 0920   CHOLHDL 2.9 02/18/2022 0920   VLDL 12 02/18/2022 0920   LDLCALC 62 02/18/2022 0920    Physical Exam:    VS:  BP 102/68   Pulse 79   Ht '6\' 3"'$  (1.905 m)   Wt 206 lb 3.2 oz (93.5 kg)   SpO2 96%   BMI 25.77 kg/m     Wt Readings from Last 3 Encounters:  08/05/22 206 lb 3.2 oz (93.5 kg)  08/04/22 208 lb 12.8 oz (94.7 kg)  07/28/22 205 lb (93 kg)     GEN: Well nourished, well developed in  no acute distress HEENT: Normal NECK: No JVD; No carotid bruits LYMPHATICS: No lymphadenopathy CARDIAC: irregularly irregular, no murmurs, rubs, gallops. Device pocket well healed. RESPIRATORY:  Clear to auscultation without rales, wheezing or rhonchi  ABDOMEN: Soft, non-tender, non-distended MUSCULOSKELETAL:  No edema; No deformity  SKIN: Warm and dry NEUROLOGIC:  Alert and oriented x 3 PSYCHIATRIC:  Normal affect        ASSESSMENT:    1. Chronic systolic heart failure ([place])   2. VT (ventricular tachycardia) (HCC)   3. Cardiac defibrillator in situ   4. Infrarenal abdominal aortic aneurysm (AAA) without rupture (HCC)    PLAN:    In order of problems listed above:  #Chronic systolic heart failure NYHA II today. Warm and dry on exam. Continue current medical therapy including lasix, imdur, metoprolol, spironolactone. He appears well compensated on today's exam.  #VT No HV therapies on ICD. Continue remote monitoring. Continue mexiletine and metoprolol. I would recommend continuing metoprolol in the perioperative period.  #ICD in situ Device functioning appropriately. Continue remote monitoring.  #AAA #Pre op Eric Gordon's perioperative risk of a major cardiac event is 11% according to the Revised Cardiac Risk Index (RCRI).  Therefore, he is at high risk for perioperative complications.   His functional capacity is good. Recommendations: The patient may proceed to surgery at an elevated but not prohibitive risk.  I recommend he continue on  mexilitine and metoprolol in the perioperative period. BP should be monitored closely to avoid hypoperfusion given his extensive coronary disease history. He should remain on telemetry in the perioperative period.  Antiplatelet and/or Anticoagulation Recommendations: Eliquis (Apixaban) can be held for 2-3 days prior to surgery.  Please resume post op when felt to be safe.     Follow-up in 6 months with APP.   Medication  Adjustments/Labs and Tests Ordered: Current medicines are reviewed at length with the patient today.  Concerns regarding medicines are outlined above.   No orders of the defined types were placed in this encounter.  No orders of the defined types were placed in this encounter.   I,Mathew Stumpf,acting as a Education administrator for Vickie Epley, MD.,have documented all relevant documentation on the behalf of Vickie Epley, MD,as directed by  Vickie Epley, MD while in the presence of Vickie Epley, MD.  I, Vickie Epley, MD, have reviewed all documentation for this visit. The documentation on 08/05/22 for the exam, diagnosis, procedures, and orders are all accurate and complete.    Signed, Lars Mage, MD, [facility], [facility] 08/05/2022 5:15 PM    Electrophysiology [facility]

## 2022-08-05 NOTE — Patient Instructions (Addendum)
Medication Instructions:  None  *If you need a refill on your cardiac medications before your next appointment, please call your pharmacy*   Lab Work: None  If you have labs (blood work) drawn today and your tests are completely normal, you will receive your results only by: MyChart Message (if you have MyChart) OR A paper copy in the mail If you have any lab test that is abnormal or we need to change your treatment, we will call you to review the results.   Testing/Procedures: None    Follow-Up: At [facility], you and your health needs are our priority.  As part of our continuing mission to provide you with exceptional heart care, we have created designated Provider Care Teams.  These Care Teams include your primary Cardiologist (physician) and Advanced Practice Providers (APPs -  Physician Assistants and Nurse Practitioners) who all work together to provide you with the care you need, when you need it.  We recommend signing up for the patient portal called "MyChart".  Sign up information is provided on this After Visit Summary.  MyChart is used to connect with patients for Virtual Visits (Telemedicine).  Patients are able to view lab/test results, encounter notes, upcoming appointments, etc.  Non-urgent messages can be sent to your provider as well.   To learn more about what you can do with MyChart, go to [email or web address].    Your next appointment:   6 month(s)  The format for your next appointment:   In Person  Provider:   You will see one of the following Advanced Practice Providers on your designated Care Team:   Renee Ursuy, PA-C Michael "Andy" Tillery, PA-C      Other Instructions None   Important Information About Sugar      

## 2022-08-06 ENCOUNTER — Inpatient Hospital Stay (HOSPITAL_COMMUNITY): Payer: No Typology Code available for payment source | Admitting: Physician Assistant

## 2022-08-06 ENCOUNTER — Inpatient Hospital Stay (HOSPITAL_COMMUNITY)
Admission: RE | Admit: 2022-08-06 | Discharge: 2022-08-07 | DRG: 269 | Disposition: A | Discharge status: Home / Self Care | Payer: No Typology Code available for payment source | Attending: Surgery | Admitting: Surgery

## 2022-08-06 ENCOUNTER — Encounter (HOSPITAL_COMMUNITY): Payer: Self-pay | Admitting: Surgery

## 2022-08-06 ENCOUNTER — Other Ambulatory Visit: Payer: Self-pay

## 2022-08-06 ENCOUNTER — Encounter (HOSPITAL_COMMUNITY)
Admission: RE | Disposition: A | Discharge status: Home / Self Care | Payer: Self-pay | Source: Home / Self Care | Attending: Surgery

## 2022-08-06 ENCOUNTER — Inpatient Hospital Stay (HOSPITAL_COMMUNITY): Payer: No Typology Code available for payment source

## 2022-08-06 ENCOUNTER — Inpatient Hospital Stay (HOSPITAL_COMMUNITY): Payer: No Typology Code available for payment source | Admitting: Anesthesiology

## 2022-08-06 DIAGNOSIS — Z87891 Personal history of nicotine dependence: Secondary | ICD-10-CM

## 2022-08-06 DIAGNOSIS — Z95810 Presence of automatic (implantable) cardiac defibrillator: Secondary | ICD-10-CM

## 2022-08-06 DIAGNOSIS — I25810 Atherosclerosis of coronary artery bypass graft(s) without angina pectoris: Secondary | ICD-10-CM | POA: Diagnosis present

## 2022-08-06 DIAGNOSIS — I2510 Atherosclerotic heart disease of native coronary artery without angina pectoris: Secondary | ICD-10-CM | POA: Diagnosis present

## 2022-08-06 DIAGNOSIS — E7800 Pure hypercholesterolemia, unspecified: Secondary | ICD-10-CM | POA: Diagnosis present

## 2022-08-06 DIAGNOSIS — Z7901 Long term (current) use of anticoagulants: Secondary | ICD-10-CM

## 2022-08-06 DIAGNOSIS — I714 Abdominal aortic aneurysm, without rupture, unspecified: Secondary | ICD-10-CM

## 2022-08-06 DIAGNOSIS — M545 Low back pain, unspecified: Secondary | ICD-10-CM | POA: Diagnosis present

## 2022-08-06 DIAGNOSIS — Z79899 Other long term (current) drug therapy: Secondary | ICD-10-CM | POA: Diagnosis not present

## 2022-08-06 DIAGNOSIS — I4891 Unspecified atrial fibrillation: Secondary | ICD-10-CM | POA: Diagnosis present

## 2022-08-06 DIAGNOSIS — K2270 Barrett's esophagus without dysplasia: Secondary | ICD-10-CM | POA: Diagnosis present

## 2022-08-06 DIAGNOSIS — F419 Anxiety disorder, unspecified: Secondary | ICD-10-CM | POA: Diagnosis present

## 2022-08-06 DIAGNOSIS — K219 Gastro-esophageal reflux disease without esophagitis: Secondary | ICD-10-CM | POA: Diagnosis present

## 2022-08-06 DIAGNOSIS — I350 Nonrheumatic aortic (valve) stenosis: Secondary | ICD-10-CM | POA: Diagnosis not present

## 2022-08-06 DIAGNOSIS — G8929 Other chronic pain: Secondary | ICD-10-CM | POA: Diagnosis present

## 2022-08-06 DIAGNOSIS — Z86711 Personal history of pulmonary embolism: Secondary | ICD-10-CM | POA: Diagnosis not present

## 2022-08-06 DIAGNOSIS — I509 Heart failure, unspecified: Secondary | ICD-10-CM | POA: Diagnosis present

## 2022-08-06 DIAGNOSIS — I252 Old myocardial infarction: Secondary | ICD-10-CM

## 2022-08-06 DIAGNOSIS — G4733 Obstructive sleep apnea (adult) (pediatric): Secondary | ICD-10-CM | POA: Diagnosis present

## 2022-08-06 DIAGNOSIS — I110 Hypertensive heart disease with heart failure: Secondary | ICD-10-CM | POA: Diagnosis present

## 2022-08-06 DIAGNOSIS — Z79890 Hormone replacement therapy (postmenopausal): Secondary | ICD-10-CM

## 2022-08-06 DIAGNOSIS — M159 Polyosteoarthritis, unspecified: Secondary | ICD-10-CM | POA: Diagnosis present

## 2022-08-06 DIAGNOSIS — Z8249 Family history of ischemic heart disease and other diseases of the circulatory system: Secondary | ICD-10-CM | POA: Diagnosis not present

## 2022-08-06 DIAGNOSIS — Z881 Allergy status to other antibiotic agents status: Secondary | ICD-10-CM | POA: Diagnosis not present

## 2022-08-06 DIAGNOSIS — E039 Hypothyroidism, unspecified: Secondary | ICD-10-CM | POA: Diagnosis present

## 2022-08-06 DIAGNOSIS — Z888 Allergy status to other drugs, medicaments and biological substances status: Secondary | ICD-10-CM

## 2022-08-06 HISTORY — PX: ABDOMINAL AORTIC ENDOVASCULAR STENT GRAFT: SHX5707

## 2022-08-06 HISTORY — PX: ULTRASOUND GUIDANCE FOR VASCULAR ACCESS: SHX6516

## 2022-08-06 LAB — CBC
HCT: 37.3 % — ABNORMAL LOW (ref 39.0–52.0)
Hemoglobin: 12.1 g/dL — ABNORMAL LOW (ref 13.0–17.0)
MCH: 31.4 pg (ref 26.0–34.0)
MCHC: 32.4 g/dL (ref 30.0–36.0)
MCV: 96.9 fL (ref 80.0–100.0)
Platelets: 111 10*3/uL — ABNORMAL LOW (ref 150–400)
RBC: 3.85 MIL/uL — ABNORMAL LOW (ref 4.22–5.81)
RDW: 14.4 % (ref 11.5–15.5)
WBC: 8 10*3/uL (ref 4.0–10.5)
nRBC: 0 % (ref 0.0–0.2)

## 2022-08-06 LAB — BASIC METABOLIC PANEL
Anion gap: 6 (ref 5–15)
BUN: 24 mg/dL — ABNORMAL HIGH (ref 8–23)
CO2: 25 mmol/L (ref 22–32)
Calcium: 8.9 mg/dL (ref 8.9–10.3)
Chloride: 109 mmol/L (ref 98–111)
Creatinine, Ser: 1.42 mg/dL — ABNORMAL HIGH (ref 0.61–1.24)
GFR, Estimated: 48 mL/min — ABNORMAL LOW (ref >60)
Glucose, Bld: 107 mg/dL — ABNORMAL HIGH (ref 70–99)
Potassium: 4.1 mmol/L (ref 3.5–5.1)
Sodium: 140 mmol/L (ref 135–145)

## 2022-08-06 LAB — APTT: aPTT: 32 seconds (ref 24–36)

## 2022-08-06 LAB — PROTIME-INR
INR: 1.2 (ref 0.8–1.2)
Prothrombin Time: 15.4 seconds — ABNORMAL HIGH (ref 11.4–15.2)

## 2022-08-06 LAB — MAGNESIUM: Magnesium: 1.8 mg/dL (ref 1.7–2.4)

## 2022-08-06 LAB — POCT ACTIVATED CLOTTING TIME: Activated Clotting Time: 221 seconds

## 2022-08-06 SURGERY — INSERTION, ENDOVASCULAR STENT GRAFT, AORTA, ABDOMINAL
Anesthesia: General | Site: Groin

## 2022-08-06 MED ORDER — ALUM & MAG HYDROXIDE-SIMETH 200-200-20 MG/5ML PO SUSP: 15.0000 mL | ORAL | Status: DC | PRN

## 2022-08-06 MED ORDER — EPHEDRINE 5 MG/ML INJ
INTRAVENOUS | Status: AC
  Filled 2022-08-06: qty 10

## 2022-08-06 MED ORDER — FENTANYL CITRATE (PF) 250 MCG/5ML IJ SOLN
INTRAMUSCULAR | Status: DC | PRN
  Administered 2022-08-06: 150 ug via INTRAVENOUS

## 2022-08-06 MED ORDER — MIDODRINE HCL 5 MG PO TABS
2.5000 mg | ORAL_TABLET | Freq: Two times a day (BID) | ORAL | Status: DC
  Administered 2022-08-06 – 2022-08-07 (×2): 2.5 mg via ORAL
  Filled 2022-08-06 (×2): qty 1

## 2022-08-06 MED ORDER — HEPARIN 6000 UNIT IRRIGATION SOLUTION
Status: DC | PRN
  Administered 2022-08-06: 1

## 2022-08-06 MED ORDER — LEVOTHYROXINE SODIUM 50 MCG PO TABS
50.0000 ug | ORAL_TABLET | Freq: Every day | ORAL | Status: DC
  Administered 2022-08-07: 50 ug via ORAL
  Filled 2022-08-06: qty 1

## 2022-08-06 MED ORDER — HEPARIN 6000 UNIT IRRIGATION SOLUTION
Status: AC
  Filled 2022-08-06: qty 500

## 2022-08-06 MED ORDER — METOPROLOL SUCCINATE ER 25 MG PO TB24
ORAL_TABLET | ORAL | Status: AC
  Filled 2022-08-06: qty 1

## 2022-08-06 MED ORDER — SODIUM CHLORIDE 0.9 % IV SOLN: INTRAVENOUS | Status: DC

## 2022-08-06 MED ORDER — CHLORHEXIDINE GLUCONATE CLOTH 2 % EX PADS: 6.0000 | MEDICATED_PAD | Freq: Once | CUTANEOUS | Status: DC

## 2022-08-06 MED ORDER — PHENYLEPHRINE HCL-NACL 20-0.9 MG/250ML-% IV SOLN
INTRAVENOUS | Status: DC | PRN
  Administered 2022-08-06: 75 ug/min via INTRAVENOUS

## 2022-08-06 MED ORDER — SULFAMETHOXAZOLE-TRIMETHOPRIM 800-160 MG PO TABS
1.0000 | ORAL_TABLET | Freq: Two times a day (BID) | ORAL | Status: DC
  Administered 2022-08-06: 1 via ORAL
  Filled 2022-08-06: qty 1

## 2022-08-06 MED ORDER — FUROSEMIDE 20 MG PO TABS: 20.0000 mg | ORAL_TABLET | Freq: Every day | ORAL | Status: DC

## 2022-08-06 MED ORDER — LIDOCAINE 2% (20 MG/ML) 5 ML SYRINGE
INTRAMUSCULAR | Status: AC
  Filled 2022-08-06: qty 5

## 2022-08-06 MED ORDER — FENTANYL CITRATE (PF) 250 MCG/5ML IJ SOLN
INTRAMUSCULAR | Status: AC
  Filled 2022-08-06: qty 5

## 2022-08-06 MED ORDER — ROCURONIUM BROMIDE 10 MG/ML (PF) SYRINGE
PREFILLED_SYRINGE | INTRAVENOUS | Status: DC | PRN
  Administered 2022-08-06: 20 mg via INTRAVENOUS
  Administered 2022-08-06: 80 mg via INTRAVENOUS

## 2022-08-06 MED ORDER — ACETAMINOPHEN 325 MG PO TABS
325.0000 mg | ORAL_TABLET | ORAL | Status: DC | PRN
  Administered 2022-08-06: 650 mg via ORAL
  Filled 2022-08-06: qty 2

## 2022-08-06 MED ORDER — ALBUMIN HUMAN 5 % IV SOLN: INTRAVENOUS | Status: DC | PRN

## 2022-08-06 MED ORDER — HEPARIN SODIUM (PORCINE) 5000 UNIT/ML IJ SOLN
5000.0000 [IU] | Freq: Three times a day (TID) | INTRAMUSCULAR | Status: DC
  Administered 2022-08-07: 5000 [IU] via SUBCUTANEOUS
  Filled 2022-08-06: qty 1

## 2022-08-06 MED ORDER — ATORVASTATIN CALCIUM 80 MG PO TABS: 80.0000 mg | ORAL_TABLET | Freq: Every day | ORAL | Status: DC

## 2022-08-06 MED ORDER — ROCURONIUM BROMIDE 10 MG/ML (PF) SYRINGE
PREFILLED_SYRINGE | INTRAVENOUS | Status: AC
  Filled 2022-08-06: qty 20

## 2022-08-06 MED ORDER — LABETALOL HCL 5 MG/ML IV SOLN: 10.0000 mg | INTRAVENOUS | Status: DC | PRN

## 2022-08-06 MED ORDER — ORAL CARE MOUTH RINSE: 15.0000 mL | Freq: Once | OROMUCOSAL | Status: AC

## 2022-08-06 MED ORDER — SODIUM CHLORIDE 0.9 % IV SOLN: 500.0000 mL | Freq: Once | INTRAVENOUS | Status: DC | PRN

## 2022-08-06 MED ORDER — PROPOFOL 10 MG/ML IV BOLUS
INTRAVENOUS | Status: DC | PRN
  Administered 2022-08-06: 100 mg via INTRAVENOUS

## 2022-08-06 MED ORDER — IODIXANOL 320 MG/ML IV SOLN
INTRAVENOUS | Status: DC | PRN
  Administered 2022-08-06: 40 mL via INTRA_ARTERIAL

## 2022-08-06 MED ORDER — ISOSORBIDE MONONITRATE ER 30 MG PO TB24: 15.0000 mg | ORAL_TABLET | Freq: Every day | ORAL | Status: DC

## 2022-08-06 MED ORDER — DEXAMETHASONE SODIUM PHOSPHATE 10 MG/ML IJ SOLN
INTRAMUSCULAR | Status: AC
  Filled 2022-08-06: qty 1

## 2022-08-06 MED ORDER — ORAL CARE MOUTH RINSE: 15.0000 mL | Freq: Once | OROMUCOSAL | Status: DC

## 2022-08-06 MED ORDER — TAMSULOSIN HCL 0.4 MG PO CAPS
0.4000 mg | ORAL_CAPSULE | Freq: Every evening | ORAL | Status: DC
  Administered 2022-08-06: 0.4 mg via ORAL
  Filled 2022-08-06: qty 1

## 2022-08-06 MED ORDER — GLYCOPYRROLATE PF 0.2 MG/ML IJ SOSY
PREFILLED_SYRINGE | INTRAMUSCULAR | Status: AC
  Filled 2022-08-06: qty 2

## 2022-08-06 MED ORDER — STERILE WATER FOR IRRIGATION IR SOLN
Status: DC | PRN
  Administered 2022-08-06: 1000 mL

## 2022-08-06 MED ORDER — LACTATED RINGERS IV SOLN: INTRAVENOUS | Status: DC

## 2022-08-06 MED ORDER — ONDANSETRON HCL 4 MG/2ML IJ SOLN
INTRAMUSCULAR | Status: AC
  Filled 2022-08-06: qty 4

## 2022-08-06 MED ORDER — HEPARIN SODIUM (PORCINE) 1000 UNIT/ML IJ SOLN
INTRAMUSCULAR | Status: DC | PRN
  Administered 2022-08-06: 9000 [IU] via INTRAVENOUS

## 2022-08-06 MED ORDER — ACETAMINOPHEN 500 MG PO TABS
ORAL_TABLET | ORAL | Status: AC
  Filled 2022-08-06: qty 2

## 2022-08-06 MED ORDER — METOPROLOL TARTRATE 5 MG/5ML IV SOLN: 2.0000 mg | INTRAVENOUS | Status: DC | PRN

## 2022-08-06 MED ORDER — LIDOCAINE 2% (20 MG/ML) 5 ML SYRINGE
INTRAMUSCULAR | Status: DC | PRN
  Administered 2022-08-06: 30 mg via INTRAVENOUS

## 2022-08-06 MED ORDER — OXYCODONE-ACETAMINOPHEN 5-325 MG PO TABS: 1.0000 | ORAL_TABLET | ORAL | Status: DC | PRN

## 2022-08-06 MED ORDER — CHLORHEXIDINE GLUCONATE 0.12 % MT SOLN: 15.0000 mL | Freq: Once | OROMUCOSAL | Status: DC

## 2022-08-06 MED ORDER — GUAIFENESIN-DM 100-10 MG/5ML PO SYRP: 15.0000 mL | ORAL_SOLUTION | ORAL | Status: DC | PRN

## 2022-08-06 MED ORDER — ONDANSETRON HCL 4 MG/2ML IJ SOLN: 4.0000 mg | Freq: Four times a day (QID) | INTRAMUSCULAR | Status: DC | PRN

## 2022-08-06 MED ORDER — METOPROLOL SUCCINATE ER 25 MG PO TB24
25.0000 mg | ORAL_TABLET | Freq: Once | ORAL | Status: AC
  Administered 2022-08-06: 25 mg via ORAL

## 2022-08-06 MED ORDER — PHENOL 1.4 % MT LIQD: 1.0000 | OROMUCOSAL | Status: DC | PRN

## 2022-08-06 MED ORDER — MORPHINE SULFATE (PF) 2 MG/ML IV SOLN: 2.0000 mg | INTRAVENOUS | Status: DC | PRN

## 2022-08-06 MED ORDER — METOPROLOL TARTRATE 12.5 MG HALF TABLET
25.0000 mg | ORAL_TABLET | Freq: Once | ORAL | Status: DC
  Filled 2022-08-06: qty 2

## 2022-08-06 MED ORDER — HYDRALAZINE HCL 20 MG/ML IJ SOLN: 5.0000 mg | INTRAMUSCULAR | Status: DC | PRN

## 2022-08-06 MED ORDER — ACETAMINOPHEN 650 MG RE SUPP: 325.0000 mg | RECTAL | Status: DC | PRN

## 2022-08-06 MED ORDER — SPIRONOLACTONE 25 MG PO TABS: 25.0000 mg | ORAL_TABLET | Freq: Every day | ORAL | Status: DC

## 2022-08-06 MED ORDER — CHLORHEXIDINE GLUCONATE 0.12 % MT SOLN
15.0000 mL | Freq: Once | OROMUCOSAL | Status: AC
  Administered 2022-08-06: 15 mL via OROMUCOSAL
  Filled 2022-08-06: qty 15

## 2022-08-06 MED ORDER — DOCUSATE SODIUM 100 MG PO CAPS: 100.0000 mg | ORAL_CAPSULE | Freq: Every day | ORAL | Status: DC

## 2022-08-06 MED ORDER — SUGAMMADEX SODIUM 200 MG/2ML IV SOLN
INTRAVENOUS | Status: DC | PRN
  Administered 2022-08-06: 400 mg via INTRAVENOUS

## 2022-08-06 MED ORDER — POTASSIUM CHLORIDE CRYS ER 20 MEQ PO TBCR: 20.0000 meq | EXTENDED_RELEASE_TABLET | Freq: Every day | ORAL | Status: DC | PRN

## 2022-08-06 MED ORDER — CIPROFLOXACIN IN D5W 400 MG/200ML IV SOLN
400.0000 mg | INTRAVENOUS | Status: AC
  Administered 2022-08-06: 400 mg via INTRAVENOUS
  Filled 2022-08-06: qty 200

## 2022-08-06 MED ORDER — MAGNESIUM SULFATE 2 GM/50ML IV SOLN: 2.0000 g | Freq: Every day | INTRAVENOUS | Status: DC | PRN

## 2022-08-06 MED ORDER — METOPROLOL SUCCINATE ER 25 MG PO TB24: 25.0000 mg | ORAL_TABLET | Freq: Every day | ORAL | Status: DC

## 2022-08-06 MED ORDER — PROTAMINE SULFATE 10 MG/ML IV SOLN
INTRAVENOUS | Status: DC | PRN
  Administered 2022-08-06 (×5): 10 mg via INTRAVENOUS

## 2022-08-06 MED ORDER — MEXILETINE HCL 150 MG PO CAPS
150.0000 mg | ORAL_CAPSULE | Freq: Two times a day (BID) | ORAL | Status: DC
  Administered 2022-08-06: 150 mg via ORAL
  Filled 2022-08-06 (×2): qty 1

## 2022-08-06 MED ORDER — PANTOPRAZOLE SODIUM 40 MG PO TBEC
40.0000 mg | DELAYED_RELEASE_TABLET | Freq: Every day | ORAL | Status: DC
  Administered 2022-08-06: 40 mg via ORAL
  Filled 2022-08-06: qty 1

## 2022-08-06 MED ORDER — PROPOFOL 10 MG/ML IV BOLUS
INTRAVENOUS | Status: AC
  Filled 2022-08-06: qty 20

## 2022-08-06 MED ORDER — PHENYLEPHRINE 80 MCG/ML (10ML) SYRINGE FOR IV PUSH (FOR BLOOD PRESSURE SUPPORT)
PREFILLED_SYRINGE | INTRAVENOUS | Status: AC
  Filled 2022-08-06: qty 20

## 2022-08-06 SURGICAL SUPPLY — 55 items
ADH SKN CLS APL DERMABOND .7 (GAUZE/BANDAGES/DRESSINGS) ×4
BAG COUNTER SPONGE SURGICOUNT (BAG) ×3 IMPLANT
BAG SPNG CNTER NS LX DISP (BAG) ×2
BLADE CLIPPER SURG (BLADE) ×3 IMPLANT
CANISTER SUCT 3000ML PPV (MISCELLANEOUS) ×3 IMPLANT
CATH BEACON 5.038 65CM KMP-01 (CATHETERS) ×3 IMPLANT
CATH OMNI FLUSH .035X70CM (CATHETERS) ×3 IMPLANT
DERMABOND ADVANCED .7 DNX12 (GAUZE/BANDAGES/DRESSINGS) ×3 IMPLANT
DEVICE CLOSURE PERCLS PRGLD 6F (VASCULAR PRODUCTS) ×12 IMPLANT
DEVICE TORQUE H2O (MISCELLANEOUS) IMPLANT
DRSG TEGADERM 2-3/8X2-3/4 SM (GAUZE/BANDAGES/DRESSINGS) ×6 IMPLANT
DRYSEAL FLEXSHEATH 12FR 33CM (SHEATH) ×2
DRYSEAL FLEXSHEATH 18FR 33CM (SHEATH) ×2
ELECT CAUTERY BLADE 6.4 (BLADE) ×3 IMPLANT
ELECT REM PT RETURN 9FT ADLT (ELECTROSURGICAL) ×4
ELECTRODE REM PT RTRN 9FT ADLT (ELECTROSURGICAL) ×6 IMPLANT
EXCLDR TRNK ENDO 36X14.5X14 18 (Endovascular Graft) ×2 IMPLANT
EXCLUDER TNK END 36X14.5X14 18 (Endovascular Graft) IMPLANT
GAUZE SPONGE 2X2 8PLY STRL LF (GAUZE/BANDAGES/DRESSINGS) ×6 IMPLANT
GLOVE SURG SS PI 7.5 STRL IVOR (GLOVE) ×9 IMPLANT
GOWN STRL REUS W/ TWL LRG LVL3 (GOWN DISPOSABLE) ×6 IMPLANT
GOWN STRL REUS W/ TWL XL LVL3 (GOWN DISPOSABLE) ×6 IMPLANT
GOWN STRL REUS W/TWL LRG LVL3 (GOWN DISPOSABLE) ×4
GOWN STRL REUS W/TWL XL LVL3 (GOWN DISPOSABLE) ×4
GRAFT BALLN CATH 65CM (STENTS) ×3 IMPLANT
GUIDEWIRE ANGLED .035X150CM (WIRE) IMPLANT
KIT BASIN OR (CUSTOM PROCEDURE TRAY) ×3 IMPLANT
KIT DRAIN CSF ACCUDRAIN (MISCELLANEOUS) IMPLANT
KIT TURNOVER KIT B (KITS) ×3 IMPLANT
LEG CONTRALATERAL 16X14.5X12 (Vascular Products) IMPLANT
LEG CONTRALATERAL 16X14.5X14 (Vascular Products) ×4 IMPLANT
NS IRRIG 1000ML POUR BTL (IV SOLUTION) ×3 IMPLANT
PACK ENDOVASCULAR (PACKS) ×3 IMPLANT
PAD ARMBOARD 7.5X6 YLW CONV (MISCELLANEOUS) ×6 IMPLANT
PENCIL BUTTON HOLSTER BLD 10FT (ELECTRODE) ×3 IMPLANT
PERCLOSE PROGLIDE 6F (VASCULAR PRODUCTS) ×8
SET MICROPUNCTURE 5F STIFF (MISCELLANEOUS) ×3 IMPLANT
SHEATH BRITE TIP 8FR 23CM (SHEATH) ×3 IMPLANT
SHEATH DRYSEAL FLEX 12FR 33CM (SHEATH) IMPLANT
SHEATH DRYSEAL FLEX 18FR 33CM (SHEATH) IMPLANT
SHEATH PINNACLE 8F 10CM (SHEATH) ×3 IMPLANT
STENT GRAFT BALLN CATH 65CM (STENTS) ×2
STOPCOCK MORSE 400PSI 3WAY (MISCELLANEOUS) ×3 IMPLANT
SUT PROLENE 5 0 C 1 24 (SUTURE) IMPLANT
SUT VIC AB 2-0 CT1 27 (SUTURE)
SUT VIC AB 2-0 CT1 TAPERPNT 27 (SUTURE) IMPLANT
SUT VIC AB 3-0 SH 27 (SUTURE)
SUT VIC AB 3-0 SH 27X BRD (SUTURE) IMPLANT
SUT VICRYL 4-0 PS2 18IN ABS (SUTURE) IMPLANT
SYR 20ML LL LF (SYRINGE) ×3 IMPLANT
TOWEL GREEN STERILE (TOWEL DISPOSABLE) ×3 IMPLANT
TRAY FOLEY MTR SLVR 16FR STAT (SET/KITS/TRAYS/PACK) ×3 IMPLANT
TUBING HIGH PRESSURE 120CM (CONNECTOR) ×3 IMPLANT
WIRE AMPLATZ SS-J .035X180CM (WIRE) ×6 IMPLANT
WIRE BENTSON .035X145CM (WIRE) ×6 IMPLANT

## 2022-08-06 NOTE — Discharge Instructions (Signed)
  [facility]   Discharge Instructions  Endovascular Aortic Aneurysm Repair  Please refer to the following instructions for your post-procedure care. Your surgeon or Physician Assistant will discuss any changes with you.  Activity  You are encouraged to walk as much as you can. You can slowly return to normal activities but must avoid strenuous activity and heavy lifting until your doctor tells you it's OK. Avoid activities such as vacuuming or swinging a gold club. It is normal to feel tired for several weeks after your surgery. Do not drive until your doctor gives the OK and you are no longer taking prescription pain medications. It is also normal to have difficulty with sleep habits, eating, and bowel movements after surgery. These will go away with time.  Bathing/Showering  Shower daily after you go home.  Do not soak in a bathtub, hot tub, or swim until the incision heals completely.  If you have incisions in your groin, wash the groin wounds with soap and water daily and pat dry. (No tub bath-only shower)  Then put a dry gauze or washcloth there to keep this area dry to help prevent wound infection daily and as needed.  Do not use Vaseline or neosporin on your incisions.  Only use soap and water on your incisions and then protect and keep dry.  Incision Care  Shower every day. Clean your incision with mild soap and water. Pat the area dry with a clean towel. You do not need a bandage unless otherwise instructed. Do not apply any ointments or creams to your incision. If you clothing is irritating, you may cover your incision with a dry gauze pad.  Diet  Resume your normal diet. There are no special food restrictions following this procedure. A low fat/low cholesterol diet is recommended for all patients with vascular disease. In order to heal from your surgery, it is CRITICAL to get adequate nutrition. Your body requires vitamins, minerals, and protein.  Vegetables are the best source of vitamins and minerals. Vegetables also provide the perfect balance of protein. Processed food has little nutritional value, so try to avoid this.  Medications  Resume taking all of your medications unless your doctor or nurse practitioner tells you not to. If your incision is causing pain, you may take over-the-counter pain relievers such as acetaminophen (Tylenol). If you were prescribed a stronger pain medication, please be aware these medications can cause nausea and constipation. Prevent nausea by taking the medication with a snack or meal. Avoid constipation by drinking plenty of fluids and eating foods with a high amount of fiber, such as fruits, vegetables, and grains.  Do not take Tylenol if you are taking prescription pain medications.   Follow up  Our office will schedule a follow-up appointment with a CT scan 3-4 weeks after your surgery.  Please call us immediately for any of the following conditions  Severe or worsening pain in your legs or feet or in your abdomen back or chest. Increased pain, redness, drainage (pus) from your incision site. Increased abdominal pain, bloating, nausea, vomiting or persistent diarrhea. Fever of 101 degrees or higher. Swelling in your leg (s),  Reduce your risk of vascular disease  Stop smoking. If you would like help call [facility] at 1-800-QUIT-NOW (1-800-784-8669) or [place] at 336-586-4000. Manage your cholesterol Maintain a desired weight Control your diabetes Keep your blood pressure down  If you have questions, please call the office at [phone].  

## 2022-08-06 NOTE — Progress Notes (Signed)
Patient arrived from pacu, report done at the bedside. Patient with B groins level 0 at this time. Vital signs obtained and ccmd made aware. Call bell with in reach. Demetres Prochnow, Bettina Gavia RN

## 2022-08-06 NOTE — Op Note (Signed)
Patient name: TORIE PRIEBE MRN: [id number] DOB: [birth year] Sex: male  08/06/2022 Pre-operative Diagnosis: Abdominal aortic aneurysm Post-operative diagnosis:  Same Surgeon:  [name] Assistants:  [place] Bing Procedure:   #1: Ultrasound-guided access, bilateral common femoral arteries   #2: Endovascular pair of abdominal aortic aneurysm   #3: Abdominal aortogram   #4: Cath in aorta x2   #5: Radiology S & I codes Anesthesia:  General Blood Loss:  minimal Specimens:  none  Findings: Complete exclusion  Indications: This is an 88 year old gentleman with a known 4.9 cm aneurysm.  He has been very worried about this.  I have tried to wait until he was greater than 5 cm, however because he has had multiple family members die of ruptured aneurysms, he is having severe anxiety.  Therefore we have elected to go ahead and get this repaired.  Procedure:  The patient was identified in the holding area and taken to [place] [facility]  The patient was then placed supine on the table. general anesthesia was administered.  The patient was prepped and draped in the usual sterile fashion.  A time out was called and antibiotics were administered.  A PA was necessary to expedite the procedure and assist with technical details.  She help with wire and catheter exchanges, device deployment, as well as groin closure.  Ultrasound was used to evaluate bilateral common femoral arteries which were widely patent and easily compressible without significant plaque.  A #11 blade was used to make a skin nick.  Bilateral common femoral arteries were cannulated under ultrasound guidance with a micropuncture needle.  A [place] wire was advanced without resistance followed placement of micropuncture sheath.  Bentson wires were then placed.  The subcutaneous tract was dilated with an 8 [place] dilator.  Pro-glide devices were deployed at the 11:00 and 1 o'clock position for free closure and 8 French sheaths were placed  bilaterally.  The patient was fully heparinized.  Amplatz wires were then inserted under fluoroscopic visualization.  A 12 French sheath was placed on the left side and an 20 French sheath on the right.  A pigtail catheter was inserted up the left side.  The main body device was prepared on the back table and inserted.  This was a Gore 36 x 14 x 14 device.  A contrast injection was then performed locating the renal arteries.  The main body device was then deployed.  I had to read constrain it and pull it down to deploy it below the renals.  Next, a second access in the 12 [place] sheath was obtained.  The contralateral gate was cannulated with a Berenstein 2 catheter and a Bentson wire.  The Berenstein catheter was removed and replaced with a pigtail catheter which was able to be rotated within the main body, confirming successful cannulation.  I performed additional angiography confirming that the device was in good position.  The restraining tie was then released, fully deploying the proximal device.  The image detector was rotated to a right anterior oblique position.  A retrograde injection was performed through the sheath in the left groin locating the left hypogastric artery.  The left limb was then prepared on the back table.  This was a 14 x 14 device.  It was deployed landing just proximal to the hypogastric artery.  Next the remaining portion of the ipsilateral limb was deployed and the delivery system was removed.  A contrast injection was performed through the sheath in the right groin  and a left anterior oblique position locating the right hypogastric artery.  The right limb was pale the back table.  This was a 14 x 12 device.  It was successfully deployed landing just proximal to the right hypogastric artery.  Next, a MOB balloon was used to mold the proximal and distal attachment sites as well as device overlap.  Completion imaging was performed which showed preservation of the renal arteries  bilaterally with no evidence of endoleak.  There was successful exclusion.  The Amplatz wires were removed and replaced with Bentson wires.  The groins were closed by securing the previously placed ProGlide devices.  The patient had brisk pedal Doppler signals at the foot.  50 mg of protamine was used to reverse the heparin.  Cautery was used on the subcutaneous tissue.  Dermabond was applied.  The patient was successfully extubated taken recovery in stable condition.  There are no immediate complications.   Disposition: To PACU stable.   Theotis Burrow, M.D., [facility] [place] Office: [phone] Pager:  [phone]

## 2022-08-06 NOTE — Interval H&P Note (Signed)
History and Physical Interval Note:  08/06/2022 11:05 AM  [place]  has presented today for surgery, with the diagnosis of Infrarenal abdominal aortic aneurysm without rupture.  The various methods of treatment have been discussed with the patient and family. After consideration of risks, benefits and other options for treatment, the patient has consented to  Procedure(s): ABDOMINAL AORTIC ENDOVASCULAR STENT GRAFT (N/A) as a surgical intervention.  The patient's history has been reviewed, patient examined, no change in status, stable for surgery.  I have reviewed the patient's chart and labs.  Questions were answered to the patient's satisfaction.     [name]

## 2022-08-06 NOTE — Anesthesia Procedure Notes (Signed)
Arterial Line Insertion Start/End11/15/2023 10:05 AM, 08/06/2022 10:09 AM Performed by: Wilburn Cornelia, CRNA, CRNA  Preanesthetic checklist: patient identified, IV checked, site marked, risks and benefits discussed, surgical consent, monitors and equipment checked, pre-op evaluation, timeout performed and anesthesia consent Lidocaine 1% used for infiltration Right, radial was placed Catheter size: 20 G Hand hygiene performed  and maximum sterile barriers used  Allen's test indicative of satisfactory collateral circulation Attempts: 2 Procedure performed without using ultrasound guided technique. Following insertion, Biopatch and dressing applied. Post procedure assessment: normal  Patient tolerated the procedure well with no immediate complications.

## 2022-08-06 NOTE — Progress Notes (Signed)
  Progress Note    08/06/2022 4:40 PM Day of Surgery  Subjective:  no complaints, resting comfortably    Vitals:   08/06/22 1500 08/06/22 1600  BP: 129/73 (!) 140/57  Pulse: (!) 58 60  Resp: 19 16  Temp:    SpO2: 98% 100%    Physical Exam: Cardiac:  regular Lungs:  nonlabored Incisions:  bilateral groin cath sites c/d/l without hematoma Extremities:  brisk DP dopplers signals bilaterally   CBC    Component Value Date/Time   WBC 8.0 08/06/2022 1346   RBC 3.85 (L) 08/06/2022 1346   HGB 12.1 (L) 08/06/2022 1346   HGB 14.1 06/20/2022 1030   HCT 37.3 (L) 08/06/2022 1346   HCT 41.6 06/20/2022 1030   PLT 111 (L) 08/06/2022 1346   PLT 174 06/20/2022 1030   MCV 96.9 08/06/2022 1346   MCV 93 06/20/2022 1030   MCH 31.4 08/06/2022 1346   MCHC 32.4 08/06/2022 1346   RDW 14.4 08/06/2022 1346   RDW 12.2 06/20/2022 1030   LYMPHSABS 2.1 02/23/2022 0111   MONOABS 1.1 (H) 02/23/2022 0111   EOSABS 0.2 02/23/2022 0111   BASOSABS 0.1 02/23/2022 0111    BMET    Component Value Date/Time   NA 140 08/06/2022 1346   NA 143 07/07/2022 0939   K 4.1 08/06/2022 1346   CL 109 08/06/2022 1346   CO2 25 08/06/2022 1346   GLUCOSE 107 (H) 08/06/2022 1346   BUN 24 (H) 08/06/2022 1346   BUN 24 07/07/2022 0939   CREATININE 1.42 (H) 08/06/2022 1346   CALCIUM 8.9 08/06/2022 1346   GFRNONAA 48 (L) 08/06/2022 1346   GFRAA >60 02/21/2020 1147    INR    Component Value Date/Time   INR 1.2 08/06/2022 1346     Intake/Output Summary (Last 24 hours) at 08/06/2022 1640 Last data filed at 08/06/2022 1519 Gross per 24 hour  Intake 451.88 ml  Output 650 ml  Net -198.12 ml      Assessment/Plan:  88 y.o. male is s/p: [facility]AR   -Patient is doing well and pain is well controlled -Bilateral groin cath sites are c/d/l without hematoma -BLE well perfused -Hopeful for d/c tomorrow if patient tolerates normal diet and gets OOB prior to discharge -Will restart home Eliquis tomorrow -4 wk  follow up with CTA abd/pelvis has been arranged   Vicente Serene PA-C [facility] [phone] 08/06/2022 4:40 PM

## 2022-08-06 NOTE — Transfer of Care (Signed)
Immediate Anesthesia Transfer of Care Note  Patient: Eric Gordon  Procedure(s) Performed: ABDOMINAL AORTIC ENDOVASCULAR STENT GRAFT ULTRASOUND GUIDANCE FOR VASCULAR ACCESS (Bilateral: Groin)  Patient Location: PACU  Anesthesia Type:General  Level of Consciousness: awake and alert   Airway & Oxygen Therapy: Patient Spontanous Breathing  Post-op Assessment: Report given to RN and Post -op Vital signs reviewed and stable  Post vital signs: Reviewed and stable  Last Vitals:  Vitals Value Taken Time  BP 124/66 08/06/22 1330  Temp 36.3 C 08/06/22 1330  Pulse 76 08/06/22 1344  Resp 22 08/06/22 1344  SpO2 97 % 08/06/22 1344  Vitals shown include unvalidated device data.  Last Pain:  Vitals:   08/06/22 1330  TempSrc:   PainSc: 0-No pain         Complications: No notable events documented.

## 2022-08-06 NOTE — Anesthesia Postprocedure Evaluation (Signed)
Anesthesia Post Note  Patient: Eric Gordon  Procedure(s) Performed: ABDOMINAL AORTIC ENDOVASCULAR STENT GRAFT ULTRASOUND GUIDANCE FOR VASCULAR ACCESS (Bilateral: Groin)     Patient location during evaluation: PACU Anesthesia Type: General Level of consciousness: sedated Pain management: pain level controlled Vital Signs Assessment: post-procedure vital signs reviewed and stable Respiratory status: spontaneous breathing and respiratory function stable Cardiovascular status: stable Postop Assessment: no apparent nausea or vomiting Anesthetic complications: no   No notable events documented.  Last Vitals:  Vitals:   08/06/22 1412 08/06/22 1422  BP:  130/70  Pulse: 60 63  Resp: 19 19  Temp:    SpO2: 98% 98%    Last Pain:  Vitals:   08/06/22 1412  TempSrc: Oral  PainSc:                  Estell Dillinger DANIEL

## 2022-08-06 NOTE — Anesthesia Procedure Notes (Signed)
Procedure Name: Intubation Date/Time: 08/06/2022 11:52 AM  Performed by: Eligha Bridegroom, CRNAPre-anesthesia Checklist: Patient identified, Emergency Drugs available, Suction available, Patient being monitored and Timeout performed Patient Re-evaluated:Patient Re-evaluated prior to induction Oxygen Delivery Method: Circle system utilized Preoxygenation: Pre-oxygenation with 100% oxygen Induction Type: IV induction Ventilation: Mask ventilation with difficulty and Oral airway inserted - appropriate to patient size Laryngoscope Size: Mac and 4 Grade View: Grade I Tube type: Oral Tube size: 7.5 mm Airway Equipment and Method: Stylet Placement Confirmation: ETT inserted through vocal cords under direct vision, positive ETCO2 and breath sounds checked- equal and bilateral Secured at: 22 cm Tube secured with: Tape Dental Injury: Teeth and Oropharynx as per pre-operative assessment

## 2022-08-07 ENCOUNTER — Encounter (HOSPITAL_COMMUNITY): Payer: Self-pay | Admitting: Surgery

## 2022-08-07 LAB — CUP PACEART INCLINIC DEVICE CHECK
Date Time Interrogation Session: 20231114135212
Implantable Lead Connection Status: 753985
Implantable Lead Connection Status: 753985
Implantable Lead Implant Date: 20230606
Implantable Lead Implant Date: 20230606
Implantable Lead Location: 753859
Implantable Lead Location: 753860
Implantable Lead Model: 377
Implantable Lead Model: 402266
Implantable Lead Serial Number: 8000807784
Implantable Lead Serial Number: 81503989
Implantable Pulse Generator Implant Date: 20230606
Pulse Gen Model: 429534
Pulse Gen Serial Number: 84900693

## 2022-08-07 LAB — CBC
HCT: 37.1 % — ABNORMAL LOW (ref 39.0–52.0)
Hemoglobin: 12.5 g/dL — ABNORMAL LOW (ref 13.0–17.0)
MCH: 32.3 pg (ref 26.0–34.0)
MCHC: 33.7 g/dL (ref 30.0–36.0)
MCV: 95.9 fL (ref 80.0–100.0)
Platelets: 108 10*3/uL — ABNORMAL LOW (ref 150–400)
RBC: 3.87 MIL/uL — ABNORMAL LOW (ref 4.22–5.81)
RDW: 14.5 % (ref 11.5–15.5)
WBC: 9 10*3/uL (ref 4.0–10.5)
nRBC: 0 % (ref 0.0–0.2)

## 2022-08-07 LAB — BASIC METABOLIC PANEL
Anion gap: 4 — ABNORMAL LOW (ref 5–15)
BUN: 20 mg/dL (ref 8–23)
CO2: 23 mmol/L (ref 22–32)
Calcium: 8.7 mg/dL — ABNORMAL LOW (ref 8.9–10.3)
Chloride: 108 mmol/L (ref 98–111)
Creatinine, Ser: 1.28 mg/dL — ABNORMAL HIGH (ref 0.61–1.24)
GFR, Estimated: 54 mL/min — ABNORMAL LOW (ref >60)
Glucose, Bld: 88 mg/dL (ref 70–99)
Potassium: 4.3 mmol/L (ref 3.5–5.1)
Sodium: 135 mmol/L (ref 135–145)

## 2022-08-07 MED ORDER — OXYCODONE-ACETAMINOPHEN 5-325 MG PO TABS: 1.0000 | ORAL_TABLET | Freq: Four times a day (QID) | ORAL | 0 refills | Status: DC | PRN

## 2022-08-07 NOTE — Progress Notes (Addendum)
  Progress Note    08/07/2022 7:30 AM 1 Day Post-Op  Subjective:  feeling great    Vitals:   08/07/22 0001 08/07/22 0414  BP: (!) 113/57 (!) 102/48  Pulse: 100 82  Resp: 20 18  Temp: 97.8 F (36.6 C) 97.9 F (36.6 C)  SpO2: 95% 96%    Physical Exam: Cardiac:  regular Lungs:  nonlabored Incisions:  bilateral groins intact and dry without hematoma Extremities:  brisk DP doppler signals bilaterally  CBC    Component Value Date/Time   WBC 9.0 08/07/2022 0444   RBC 3.87 (L) 08/07/2022 0444   HGB 12.5 (L) 08/07/2022 0444   HGB 14.1 06/20/2022 1030   HCT 37.1 (L) 08/07/2022 0444   HCT 41.6 06/20/2022 1030   PLT 108 (L) 08/07/2022 0444   PLT 174 06/20/2022 1030   MCV 95.9 08/07/2022 0444   MCV 93 06/20/2022 1030   MCH 32.3 08/07/2022 0444   MCHC 33.7 08/07/2022 0444   RDW 14.5 08/07/2022 0444   RDW 12.2 06/20/2022 1030   LYMPHSABS 2.1 02/23/2022 0111   MONOABS 1.1 (H) 02/23/2022 0111   EOSABS 0.2 02/23/2022 0111   BASOSABS 0.1 02/23/2022 0111    BMET    Component Value Date/Time   NA 135 08/07/2022 0444   NA 143 07/07/2022 0939   K 4.3 08/07/2022 0444   CL 108 08/07/2022 0444   CO2 23 08/07/2022 0444   GLUCOSE 88 08/07/2022 0444   BUN 20 08/07/2022 0444   BUN 24 07/07/2022 0939   CREATININE 1.28 (H) 08/07/2022 0444   CALCIUM 8.7 (L) 08/07/2022 0444   GFRNONAA 54 (L) 08/07/2022 0444   GFRAA >60 02/21/2020 1147    INR    Component Value Date/Time   INR 1.2 08/06/2022 1346     Intake/Output Summary (Last 24 hours) at 08/07/2022 0730 Last data filed at 08/07/2022 0414 Gross per 24 hour  Intake 451.88 ml  Output 1430 ml  Net -978.12 ml      Assessment/Plan:  88 y.o. male is 1 day post op, s/p: [facility]   -Pain is well controlled -Bilateral groins clean intact without hematoma -BLE well perfused with brisk DP doppler signals -OOB this morning and then D/C if no issues -4 wk follow up arranged. Patient can restart home Eliquis today at  discharge  Vicente Serene, [place] [facility] [phone] 08/07/2022 7:30 AM   POD#! From [facility].  Looks great.  Plan fo d/c once he voids and ambulates  [place] Adalae Baysinger

## 2022-08-09 NOTE — Discharge Summary (Signed)
EVAR Discharge Summary   Eric Gordon 23-Oct-1930 88 y.o. male  MRN: [id number]  Admission Date: 08/06/2022  Discharge Date: 08/07/2022  Physician: Harold Barban, MD  Admission Diagnosis: AAA (abdominal aortic aneurysm) [facility]) [I71.40]  Discharge Day Diagnosis:  AAA (abdominal aortic aneurysm) [facility]) [I71.40]  Hospital Course:  The patient was admitted to the hospital and taken to the operating room on 08/06/2022 and underwent: endovascular repair of abdominal aortic aneurysm.  The pt tolerated the procedure well and was transported to the PACU in excellent condition.   The remainder of the hospital course consisted of increasing mobilization and increasing intake of solids without difficulty.  POD 1 the patient was ambulating well and tolerating a normal diet. Bilateral lower extremities were well perfused and bilateral groin cath sites were intact without hematoma. The patient was discharged home.  CBC    Component Value Date/Time   WBC 9.0 08/07/2022 0444   RBC 3.87 (L) 08/07/2022 0444   HGB 12.5 (L) 08/07/2022 0444   HGB 14.1 06/20/2022 1030   HCT 37.1 (L) 08/07/2022 0444   HCT 41.6 06/20/2022 1030   PLT 108 (L) 08/07/2022 0444   PLT 174 06/20/2022 1030   MCV 95.9 08/07/2022 0444   MCV 93 06/20/2022 1030   MCH 32.3 08/07/2022 0444   MCHC 33.7 08/07/2022 0444   RDW 14.5 08/07/2022 0444   RDW 12.2 06/20/2022 1030   LYMPHSABS 2.1 02/23/2022 0111   MONOABS 1.1 (H) 02/23/2022 0111   EOSABS 0.2 02/23/2022 0111   BASOSABS 0.1 02/23/2022 0111    BMET    Component Value Date/Time   NA 135 08/07/2022 0444   NA 143 07/07/2022 0939   K 4.3 08/07/2022 0444   CL 108 08/07/2022 0444   CO2 23 08/07/2022 0444   GLUCOSE 88 08/07/2022 0444   BUN 20 08/07/2022 0444   BUN 24 07/07/2022 0939   CREATININE 1.28 (H) 08/07/2022 0444   CALCIUM 8.7 (L) 08/07/2022 0444   GFRNONAA 54 (L) 08/07/2022 0444   GFRAA >60 02/21/2020 1147       Discharge Instructions      Call MD for:  redness, tenderness, or signs of infection (pain, swelling, redness, odor or green/yellow discharge around incision site)   Complete by: As directed    Call MD for:  severe uncontrolled pain   Complete by: As directed    Call MD for:  temperature >100.4   Complete by: As directed    Diet - low sodium heart healthy   Complete by: As directed    Discharge wound care:   Complete by: As directed    Keep groins clean and dry. Can wash daily with soap and water, pat dry   Increase activity slowly   Complete by: As directed        Discharge Diagnosis:  AAA (abdominal aortic aneurysm) ([place]) [I71.40]  Secondary Diagnosis: Patient Active Problem List   Diagnosis Date Noted   AAA (abdominal aortic aneurysm) ([place]) 08/06/2022   Hypomagnesemia 02/25/2022   Chest wall contusion 02/25/2022   Laceration of head 02/25/2022   AKI (acute kidney injury) ([place]) 02/23/2022   Hyperlipidemia 02/23/2022   PVD (peripheral vascular disease) carotid 02/23/2022   Tachy-brady syndrome ([place]) 02/23/2022   Syncope 02/22/2022   Dizzy 01/16/2022   Dizziness 01/16/2022   Dyspnea 01/01/2022   Pseudoaneurysm of left ventricle of heart 12/31/2021   Acquired aneurysm of left ventricle of heart 12/31/2021   Carotid stenosis 02/08/2021   Status post carotid endarterectomy 09/13/2020  Carotid artery stenosis, symptomatic, right 09/13/2020   Tear of lateral meniscus of knee 05/04/2020   Osteoarthritis of left knee 04/05/2020   Pain in left knee 04/03/2020   Pleural effusion 04/12/2019   Pneumothorax on left 01/10/2019   Pneumothorax, left 01/09/2019   CKD (chronic kidney disease), stage III ([place]) 01/09/2019   Acute on chronic combined systolic and diastolic CHF (congestive heart failure) ([place]) 01/09/2019   Hypothyroidism 01/09/2019   Pleural effusion on left 01/09/2019   NSTEMI (non-ST elevated myocardial infarction) ([place]) 63/84/6659   Acute diastolic CHF (congestive heart failure) (HCC)     Non-ST elevation (NSTEMI) myocardial infarction ([place]) 10/17/2018   CAP (community acquired pneumonia) 03/19/2018   Hoarseness 12/15/2013   Mild aortic stenosis 11/03/2012   Paroxysmal atrial fibrillation ([place]) 04/02/2011   Carotid stenosis, asymptomatic 04/02/2011   CAD s/p CABG 3/12  01/10/2011   Abdominal aortic aneurysm ([place]) 11/06/2010   CHEST TIGHTNESS-PRESSURE-OTHER 11/01/2010   Asthma, mild intermittent 04/10/2010   DYSPNEA 02/26/2010   OBSTRUCTIVE SLEEP APNEA 08/22/2008   Essential hypertension 08/22/2008   BARRETTS ESOPHAGUS 08/18/2008   HIATAL HERNIA 08/18/2008   DIVERTICULOSIS, COLON 08/18/2008   COLONIC POLYPS, HX OF 08/18/2008   Past Medical History:  Diagnosis Date   Abdominal aortic aneurysm (AAA) ([place])    Achilles tendinitis of right lower extremity    AICD (automatic cardioverter/defibrillator) present    Arthritis    "my whole body" (03/19/2018)   Atherosclerosis of coronary artery bypass graft w/o angina pectoris    Atrial fibrillation (HCC)    Barrett's esophagus    CAD (coronary artery disease)    Carotid artery disease (HCC)    CHF (congestive heart failure) (HCC)    Chronic anticoagulation    PE   Chronic lower back pain    Dyspnea    Dysrhythmia    Essential tremor    GERD (gastroesophageal reflux disease)    High cholesterol    Hyperlipidemia    Hyperplasia, prostate    Hypertension    Hypothyroidism    Myocardial infarction ([place])    2 in Jan. 2019   Nail dystrophy    OSA on CPAP    uses CPAP   Osteoarthrosis    Pneumonia    "now and once before" (03/19/2018)   Pulmonary embolism [facility])    April 2012 after CABG   S/P CABG (coronary artery bypass graft)    Sleep apnea    Spontaneous pneumothorax    left spontaneous pneumothorax/left pleural effusion, s/p chest tube 01/09/19; thoracentesis x2, s/p left VATS/tacl pleurodesis 04/12/19     Allergies as of 08/07/2022       Reactions   Ancef [cefazolin Sodium] Hives   Empagliflozin Other  (See Comments)   Dizziness   Vancomycin Rash        Medication List     STOP taking these medications    sulfamethoxazole-trimethoprim 800-160 MG tablet Commonly known as: BACTRIM DS   traMADol 50 MG tablet Commonly known as: ULTRAM       TAKE these medications    acetaminophen 500 MG tablet Commonly known as: TYLENOL Take 1,000 mg by mouth every 6 (six) hours as needed for moderate pain or headache.   apixaban 5 MG Tabs tablet Commonly known as: ELIQUIS Take 1 tablet (5 mg total) by mouth 2 (two) times daily. Restart after 5 days on 6/12   atorvastatin 80 MG tablet Commonly known as: LIPITOR Take 1 tablet (80 mg total) by mouth daily.   Carboxymethylcellulose Sod  PF 0.5 % Soln Place 1 drop into both eyes 4 (four) times daily as needed (dry/irritated eyes.).   fexofenadine 180 MG tablet Commonly known as: ALLEGRA Take 180 mg by mouth in the morning.   furosemide 20 MG tablet Commonly known as: LASIX Take 1 tablet (20 mg total) by mouth daily.   isosorbide mononitrate 30 MG 24 hr tablet Commonly known as: IMDUR Take 0.5 tablets (15 mg total) by mouth daily.   levothyroxine 50 MCG tablet Commonly known as: SYNTHROID Take 50 mcg by mouth at bedtime.   metoprolol succinate 25 MG 24 hr tablet Commonly known as: Toprol XL Take 1 tablet (25 mg total) by mouth daily.   mexiletine 150 MG capsule Commonly known as: MEXITIL Take 1 capsule (150 mg total) by mouth 2 (two) times daily.   midodrine 2.5 MG tablet Commonly known as: PROAMATINE Take 1 tablet (2.5 mg total) by mouth 2 (two) times daily with a meal.   multivitamin with minerals Tabs tablet Take 1 tablet by mouth in the morning.   oxyCODONE-acetaminophen 5-325 MG tablet Commonly known as: PERCOCET/ROXICET Take 1 tablet by mouth every 6 (six) hours as needed for moderate pain.   pantoprazole 40 MG tablet Commonly known as: PROTONIX Take 40 mg by mouth daily before breakfast.   spironolactone 25  MG tablet Commonly known as: ALDACTONE Take 1 tablet (25 mg total) by mouth at bedtime.   tamsulosin 0.4 MG Caps capsule Commonly known as: FLOMAX Take 0.4 mg by mouth every evening.   Vitamin D-3 25 MCG (1000 UT) Caps Take 1,000 Units by mouth in the morning.               Discharge Care Instructions  (From admission, onward)           Start     Ordered   08/07/22 0000  Discharge wound care:       Comments: Keep groins clean and dry. Can wash daily with soap and water, pat dry   08/07/22 0737            Discharge Instructions:   [facility]  Discharge Instructions Endovascular Aortic Aneurysm Repair  Please refer to the following instructions for your post-procedure care. Your surgeon or Physician Assistant will discuss any changes with you.  Activity  You are encouraged to walk as much as you can. You can slowly return to normal activities but must avoid strenuous activity and heavy lifting until your doctor tells you it's OK. Avoid activities such as vacuuming or swinging a gold club. It is normal to feel tired for several weeks after your surgery. Do not drive until your doctor gives the OK and you are no longer taking prescription pain medications. It is also normal to have difficulty with sleep habits, eating, and bowel movements after surgery. These will go away with time.  Bathing/Showering  You may shower after you go home. If you have an incision, do not soak in a bathtub, hot tub, or swim until the incision heals completely.  Incision Care  Shower every day. Clean your incision with mild soap and water. Pat the area dry with a clean towel. You do not need a bandage unless otherwise instructed. Do not apply any ointments or creams to your incision. If you clothing is irritating, you may cover your incision with a dry gauze pad.  Diet  Resume your normal diet. There are no special food restrictions following this  procedure. A low  fat/low cholesterol diet is recommended for all patients with vascular disease. In order to heal from your surgery, it is CRITICAL to get adequate nutrition. Your body requires vitamins, minerals, and protein. Vegetables are the best source of vitamins and minerals. Vegetables also provide the perfect balance of protein. Processed food has little nutritional value, so try to avoid this.  Medications  Resume taking all of your medications unless your doctor or Physician Assistnat tells you not to. If your incision is causing pain, you may take over-the-counter pain relievers such as acetaminophen (Tylenol). If you were prescribed a stronger pain medication, please be aware these medications can cause nausea and constipation. Prevent nausea by taking the medication with a snack or meal. Avoid constipation by drinking plenty of fluids and eating foods with a high amount of fiber, such as fruits, vegetables, and grains. Do not take Tylenol if you are taking prescription pain medications.   Follow up  [place] office will schedule a follow-up appointment with a C.T. scan 3-4 weeks after your surgery.  Please call us immediately for any of the following conditions  Severe or worsening pain in your legs or feet or in your abdomen back or chest. Increased pain, redness, drainage (pus) from your incision sit. Increased abdominal pain, bloating, nausea, vomiting or persistent diarrhea. Fever of 101 degrees or higher. Swelling in your leg (s),  Reduce your risk of vascular disease  Stop smoking. If you would like help call [facility] at 1-800-QUIT-NOW (754) 700-1149) or [place] at [phone]. Manage your cholesterol Maintain a desired weight Control your diabetes Keep your blood pressure down  If you have questions, please call the office at [phone].   Disposition: Home  Patient's condition: is Good  Follow up: 1. Dr. Trula Slade in 4 weeks with CTA protocol   Vicente Serene, PA-C [facility] [phone] 08/09/2022  9:19 PM   - For VQI Registry use - Post-op:  Time to Extubation: '[x]'$  In OR, '[ ]'$  < 12 hrs, '[ ]'$  12-24 hrs, '[ ]'$  >=24 hrs Vasopressors Req. Post-op: No MI: No., '[ ]'$  Troponin only, '[ ]'$  EKG or Clinical New Arrhythmia: No CHF: No ICU Stay: 0 days Transfusion: No     If yes,  units given  Complications: Resp failure: No., '[ ]'$  Pneumonia, '[ ]'$  Ventilator Chg in renal function: No., '[ ]'$  Inc. Cr > 0.5, '[ ]'$  Temp. Dialysis,  '[ ]'$  Permanent dialysis Leg ischemia: No., no Surgery needed, '[ ]'$  Yes, Surgery needed,  '[ ]'$  Amputation Bowel ischemia: No., '[ ]'$  Medical Rx, '[ ]'$  Surgical Rx Wound complication: No., '[ ]'$  Superficial separation/infection, '[ ]'$  Return to OR Return to OR: No  Return to OR for bleeding: No Stroke: No., '[ ]'$  Minor, '[ ]'$  Major  Discharge medications: Statin use:  Yes  ASA use:  No  Plavix use:  No  Beta blocker use:  Yes  ARB use:  No ACEI use:  No CCB use:  No

## 2022-08-12 ENCOUNTER — Encounter (HOSPITAL_COMMUNITY): Payer: Self-pay | Admitting: Surgery

## 2022-08-16 ENCOUNTER — Encounter (HOSPITAL_COMMUNITY): Payer: Self-pay | Admitting: Surgery

## 2022-08-19 ENCOUNTER — Other Ambulatory Visit: Payer: Self-pay

## 2022-08-19 DIAGNOSIS — I7143 Infrarenal abdominal aortic aneurysm, without rupture: Secondary | ICD-10-CM

## 2022-08-27 ENCOUNTER — Ambulatory Visit (INDEPENDENT_AMBULATORY_CARE_PROVIDER_SITE_OTHER): Payer: Medicare Other

## 2022-08-27 DIAGNOSIS — I255 Ischemic cardiomyopathy: Secondary | ICD-10-CM

## 2022-08-27 LAB — CUP PACEART REMOTE DEVICE CHECK
Battery Voltage: 3.01 V
Brady Statistic RA Percent Paced: 0 %
Brady Statistic RV Percent Paced: 6 %
Date Time Interrogation Session: 20231206103956
HighPow Impedance: 79 Ohm
Implantable Lead Connection Status: 753985
Implantable Lead Connection Status: 753985
Implantable Lead Implant Date: 20230606
Implantable Lead Implant Date: 20230606
Implantable Lead Location: 753859
Implantable Lead Location: 753860
Implantable Lead Model: 377
Implantable Lead Model: 402266
Implantable Lead Serial Number: 8000807784
Implantable Lead Serial Number: 81503989
Implantable Pulse Generator Implant Date: 20230606
Lead Channel Impedance Value: 500 Ohm
Lead Channel Impedance Value: 598 Ohm
Lead Channel Sensing Intrinsic Amplitude: 1.7 mV
Lead Channel Sensing Intrinsic Amplitude: 7.9 mV
Pulse Gen Model: 429534
Pulse Gen Serial Number: 84900693
Zone Setting Status: 755011

## 2022-08-28 ENCOUNTER — Ambulatory Visit (HOSPITAL_COMMUNITY)
Admission: RE | Admit: 2022-08-28 | Discharge: 2022-08-28 | Disposition: A | Discharge status: Home / Self Care | Payer: No Typology Code available for payment source | Source: Ambulatory Visit | Attending: Surgery | Admitting: Surgery

## 2022-08-28 DIAGNOSIS — I7143 Infrarenal abdominal aortic aneurysm, without rupture: Secondary | ICD-10-CM | POA: Diagnosis present

## 2022-08-28 MED ORDER — IOHEXOL 350 MG/ML SOLN
125.0000 mL | Freq: Once | INTRAVENOUS | Status: AC | PRN
  Administered 2022-08-28: 125 mL via INTRAVENOUS

## 2022-08-29 DIAGNOSIS — M1990 Unspecified osteoarthritis, unspecified site: Secondary | ICD-10-CM | POA: Diagnosis not present

## 2022-08-29 DIAGNOSIS — E1122 Type 2 diabetes mellitus with diabetic chronic kidney disease: Secondary | ICD-10-CM | POA: Diagnosis not present

## 2022-08-29 DIAGNOSIS — N1831 Chronic kidney disease, stage 3a: Secondary | ICD-10-CM | POA: Diagnosis not present

## 2022-08-29 DIAGNOSIS — I5022 Chronic systolic (congestive) heart failure: Secondary | ICD-10-CM | POA: Diagnosis not present

## 2022-08-29 DIAGNOSIS — N401 Enlarged prostate with lower urinary tract symptoms: Secondary | ICD-10-CM | POA: Diagnosis not present

## 2022-08-29 DIAGNOSIS — I700 Atherosclerosis of aorta: Secondary | ICD-10-CM | POA: Diagnosis not present

## 2022-08-29 DIAGNOSIS — I130 Hypertensive heart and chronic kidney disease with heart failure and stage 1 through stage 4 chronic kidney disease, or unspecified chronic kidney disease: Secondary | ICD-10-CM | POA: Diagnosis not present

## 2022-08-29 DIAGNOSIS — E039 Hypothyroidism, unspecified: Secondary | ICD-10-CM | POA: Diagnosis not present

## 2022-08-29 DIAGNOSIS — E785 Hyperlipidemia, unspecified: Secondary | ICD-10-CM | POA: Diagnosis not present

## 2022-08-29 DIAGNOSIS — I2510 Atherosclerotic heart disease of native coronary artery without angina pectoris: Secondary | ICD-10-CM | POA: Diagnosis not present

## 2022-08-29 DIAGNOSIS — I6529 Occlusion and stenosis of unspecified carotid artery: Secondary | ICD-10-CM | POA: Diagnosis not present

## 2022-08-29 DIAGNOSIS — I480 Paroxysmal atrial fibrillation: Secondary | ICD-10-CM | POA: Diagnosis not present

## 2022-09-01 ENCOUNTER — Encounter: Payer: Self-pay | Admitting: Surgery

## 2022-09-01 ENCOUNTER — Telehealth: Payer: Self-pay

## 2022-09-01 ENCOUNTER — Ambulatory Visit (INDEPENDENT_AMBULATORY_CARE_PROVIDER_SITE_OTHER): Payer: Medicare Other | Admitting: Surgery

## 2022-09-01 VITALS — BP 132/87 | HR 67 | Temp 98.1°F | Resp 20 | Ht 75.0 in | Wt 204.0 lb

## 2022-09-01 DIAGNOSIS — I7143 Infrarenal abdominal aortic aneurysm, without rupture: Secondary | ICD-10-CM

## 2022-09-01 NOTE — Telephone Encounter (Signed)
Frequent [employer] alerts for AF w/ elevated VR's. Patient called and reports doing well and no new symptoms. Compliant with meds on file. Routing to Dr. Quentin Ore considering daily alerts.

## 2022-09-01 NOTE — Progress Notes (Signed)
Patient name: Eric Gordon MRN: [id number] DOB: [birth year] Sex: male  REASON FOR VISIT:     Post op  HISTORY OF PRESENT ILLNESS:   Eric Gordon is a 88 y.o. male with a 5 cm aneurysm that is having significant anxiety about getting it fixed because he has had 2 uncles died with a ruptured aneurysm.  On 08/06/2022 he underwent endovascular repair he is back for follow-up.  He has no complaints  He is status post right sided TCAR on 09/13/2020 for 90% stenosis.  He then underwent left-sided TCAR on 02/08/2021 for 90% stenosis.  Patient has a history of coronary artery disease status post CABG.  He has had a NSTEMI from occluded bypass grafts.  He has atrial fibrillation.  He is anticoagulated on Eliquis.  He has had a spontaneous pneumothorax requiring chest tube decompression he gets dyspneic at 150 to 200 yards.  He is medically managed for hypertension.  He takes a statin for hypercholesterolemia.  He recently had a defibrillator placed for V. tach and NYHA class III heart failure   CURRENT MEDICATIONS:    Current Outpatient Medications  Medication Sig Dispense Refill   acetaminophen (TYLENOL) 500 MG tablet Take 1,000 mg by mouth every 6 (six) hours as needed for moderate pain or headache.     apixaban (ELIQUIS) 5 MG TABS tablet Take 1 tablet (5 mg total) by mouth 2 (two) times daily. Restart after 5 days on 6/12     atorvastatin (LIPITOR) 80 MG tablet Take 1 tablet (80 mg total) by mouth daily. 30 tablet 6   Carboxymethylcellulose Sod PF 0.5 % SOLN Place 1 drop into both eyes 4 (four) times daily as needed (dry/irritated eyes.).     Cholecalciferol (VITAMIN D-3) 25 MCG (1000 UT) CAPS Take 1,000 Units by mouth in the morning.     fexofenadine (ALLEGRA) 180 MG tablet Take 180 mg by mouth in the morning.     furosemide (LASIX) 20 MG tablet Take 1 tablet (20 mg total) by mouth daily. 90 tablet 3   isosorbide mononitrate (IMDUR) 30 MG 24 hr  tablet Take 0.5 tablets (15 mg total) by mouth daily.     levothyroxine (SYNTHROID) 50 MCG tablet Take 50 mcg by mouth at bedtime.     metoprolol succinate (TOPROL XL) 25 MG 24 hr tablet Take 1 tablet (25 mg total) by mouth daily.     mexiletine (MEXITIL) 150 MG capsule Take 1 capsule (150 mg total) by mouth 2 (two) times daily. 180 capsule 2   midodrine (PROAMATINE) 2.5 MG tablet Take 1 tablet (2.5 mg total) by mouth 2 (two) times daily with a meal. 180 tablet 3   Multiple Vitamin (MULTIVITAMIN WITH MINERALS) TABS tablet Take 1 tablet by mouth in the morning.     oxyCODONE-acetaminophen (PERCOCET/ROXICET) 5-325 MG tablet Take 1 tablet by mouth every 6 (six) hours as needed for moderate pain. 10 tablet 0   pantoprazole (PROTONIX) 40 MG tablet Take 40 mg by mouth daily before breakfast.     spironolactone (ALDACTONE) 25 MG tablet Take 1 tablet (25 mg total) by mouth at bedtime. 90 tablet 3   tamsulosin (FLOMAX) 0.4 MG CAPS capsule Take 0.4 mg by mouth every evening.     No current facility-administered medications for this visit.    REVIEW OF SYSTEMS:   '[X]'$  denotes positive finding, '[ ]'$  denotes negative finding Cardiac  Comments:  Chest pain or chest pressure:    Shortness of breath upon exertion:  Short of breath when lying flat:    Irregular heart rhythm:    Constitutional    Fever or chills:      PHYSICAL EXAM:   Vitals:   09/01/22 1202  BP: 132/87  Pulse: 67  Resp: 20  Temp: 98.1 F (36.7 C)  SpO2: 96%  Weight: 204 lb (92.5 kg)  Height: '6\' 3"'$  (1.905 m)    GENERAL: The patient is a well-nourished male, in no acute distress. The vital signs are documented above. CARDIOVASCULAR: There is a regular rate and rhythm. PULMONARY: Non-labored respirations    STUDIES:  I have reviewed his CT scan with the following findings:   VASCULAR   1. Status post aortobi-iliac endograft repair of infrarenal abdominal aortic aneurysm. There is a small type 2 endoleak arising from  the inferior mesenteric artery. No evidence of significant interval aneurysm sac enlargement 2.  Aortic Atherosclerosis (ICD10-I70.0).   NON-VASCULAR   1. Similar appearing small left pleural effusion with bibasilar cicatricial atelectasis. 2. Diverticulosis. 3. Prostatomegaly   MEDICAL ISSUES:   AAA: Subtle type II endoleak from the inferior mesenteric artery.  No significant sac expansion.  He will get an ultrasound in 6 months.  Leia Alf, MD, FACS [facility] [phone] Pager [phone]

## 2022-09-02 NOTE — Telephone Encounter (Signed)
Alerts have been disabled per [place] with [place].

## 2022-09-03 ENCOUNTER — Other Ambulatory Visit: Payer: Self-pay

## 2022-09-03 DIAGNOSIS — I7143 Infrarenal abdominal aortic aneurysm, without rupture: Secondary | ICD-10-CM

## 2022-09-03 DIAGNOSIS — I6529 Occlusion and stenosis of unspecified carotid artery: Secondary | ICD-10-CM

## 2022-09-03 DIAGNOSIS — I6523 Occlusion and stenosis of bilateral carotid arteries: Secondary | ICD-10-CM

## 2022-09-19 NOTE — Progress Notes (Signed)
Remote ICD transmission.   

## 2022-10-13 ENCOUNTER — Telehealth (HOSPITAL_COMMUNITY): Payer: Self-pay

## 2022-10-13 NOTE — Telephone Encounter (Signed)
Needs to be seen asap in office.  Work in with APP tomorrow or the next day.

## 2022-10-13 NOTE — Telephone Encounter (Signed)
I spoke with patient and he is coming in to APP clinic tomorrow.

## 2022-10-13 NOTE — Telephone Encounter (Signed)
Patient called to report that for the past week he has been experiencing chest pain off and on along with increased shortness of breath, dizziness and losing his voice sometimes. He denies nausea/vomiting, swelling, vision changes, headaches. He reports that over the past several days his blood pressure has been 153/79,151/85,151/89. His o2 has been 86-87. Patient does not wear oxygen. Please advise.

## 2022-10-14 ENCOUNTER — Emergency Department (HOSPITAL_COMMUNITY): Payer: No Typology Code available for payment source

## 2022-10-14 ENCOUNTER — Other Ambulatory Visit: Payer: Self-pay

## 2022-10-14 ENCOUNTER — Encounter (HOSPITAL_COMMUNITY): Payer: Self-pay

## 2022-10-14 ENCOUNTER — Ambulatory Visit (HOSPITAL_COMMUNITY)
Admission: RE | Admit: 2022-10-14 | Discharge: 2022-10-14 | Disposition: A | Discharge status: Home / Self Care | Payer: No Typology Code available for payment source | Source: Ambulatory Visit | Attending: Family Medicine | Admitting: Family Medicine

## 2022-10-14 ENCOUNTER — Observation Stay (HOSPITAL_COMMUNITY)
Admission: EM | Admit: 2022-10-14 | Discharge: 2022-10-16 | Disposition: A | Discharge status: Home / Self Care | Payer: No Typology Code available for payment source | Attending: Cardiology | Admitting: Cardiology

## 2022-10-14 VITALS — BP 122/80 | HR 85 | Wt 203.0 lb

## 2022-10-14 DIAGNOSIS — I25110 Atherosclerotic heart disease of native coronary artery with unstable angina pectoris: Secondary | ICD-10-CM | POA: Insufficient documentation

## 2022-10-14 DIAGNOSIS — N183 Chronic kidney disease, stage 3 unspecified: Secondary | ICD-10-CM | POA: Diagnosis not present

## 2022-10-14 DIAGNOSIS — I5042 Chronic combined systolic (congestive) and diastolic (congestive) heart failure: Secondary | ICD-10-CM | POA: Insufficient documentation

## 2022-10-14 DIAGNOSIS — I729 Aneurysm of unspecified site: Secondary | ICD-10-CM | POA: Insufficient documentation

## 2022-10-14 DIAGNOSIS — R072 Precordial pain: Secondary | ICD-10-CM | POA: Diagnosis not present

## 2022-10-14 DIAGNOSIS — N281 Cyst of kidney, acquired: Secondary | ICD-10-CM | POA: Diagnosis not present

## 2022-10-14 DIAGNOSIS — R079 Chest pain, unspecified: Secondary | ICD-10-CM | POA: Diagnosis present

## 2022-10-14 DIAGNOSIS — J90 Pleural effusion, not elsewhere classified: Secondary | ICD-10-CM | POA: Diagnosis not present

## 2022-10-14 DIAGNOSIS — I4819 Other persistent atrial fibrillation: Secondary | ICD-10-CM | POA: Diagnosis not present

## 2022-10-14 DIAGNOSIS — Z951 Presence of aortocoronary bypass graft: Secondary | ICD-10-CM | POA: Diagnosis not present

## 2022-10-14 DIAGNOSIS — K449 Diaphragmatic hernia without obstruction or gangrene: Secondary | ICD-10-CM | POA: Insufficient documentation

## 2022-10-14 DIAGNOSIS — I2510 Atherosclerotic heart disease of native coronary artery without angina pectoris: Secondary | ICD-10-CM | POA: Insufficient documentation

## 2022-10-14 DIAGNOSIS — J9811 Atelectasis: Secondary | ICD-10-CM | POA: Diagnosis not present

## 2022-10-14 DIAGNOSIS — Z1152 Encounter for screening for COVID-19: Secondary | ICD-10-CM | POA: Diagnosis not present

## 2022-10-14 DIAGNOSIS — I5043 Acute on chronic combined systolic (congestive) and diastolic (congestive) heart failure: Secondary | ICD-10-CM | POA: Diagnosis not present

## 2022-10-14 DIAGNOSIS — I4892 Unspecified atrial flutter: Secondary | ICD-10-CM | POA: Diagnosis not present

## 2022-10-14 DIAGNOSIS — Z86711 Personal history of pulmonary embolism: Secondary | ICD-10-CM | POA: Diagnosis not present

## 2022-10-14 DIAGNOSIS — I6523 Occlusion and stenosis of bilateral carotid arteries: Secondary | ICD-10-CM | POA: Diagnosis not present

## 2022-10-14 DIAGNOSIS — Z950 Presence of cardiac pacemaker: Secondary | ICD-10-CM | POA: Insufficient documentation

## 2022-10-14 DIAGNOSIS — R531 Weakness: Secondary | ICD-10-CM | POA: Insufficient documentation

## 2022-10-14 DIAGNOSIS — I130 Hypertensive heart and chronic kidney disease with heart failure and stage 1 through stage 4 chronic kidney disease, or unspecified chronic kidney disease: Secondary | ICD-10-CM | POA: Diagnosis not present

## 2022-10-14 DIAGNOSIS — I252 Old myocardial infarction: Secondary | ICD-10-CM | POA: Diagnosis not present

## 2022-10-14 DIAGNOSIS — I714 Abdominal aortic aneurysm, without rupture, unspecified: Secondary | ICD-10-CM | POA: Diagnosis not present

## 2022-10-14 DIAGNOSIS — Z87891 Personal history of nicotine dependence: Secondary | ICD-10-CM | POA: Diagnosis not present

## 2022-10-14 DIAGNOSIS — R059 Cough, unspecified: Secondary | ICD-10-CM | POA: Diagnosis not present

## 2022-10-14 DIAGNOSIS — I951 Orthostatic hypotension: Secondary | ICD-10-CM | POA: Insufficient documentation

## 2022-10-14 DIAGNOSIS — E785 Hyperlipidemia, unspecified: Secondary | ICD-10-CM | POA: Diagnosis not present

## 2022-10-14 DIAGNOSIS — Z79899 Other long term (current) drug therapy: Secondary | ICD-10-CM | POA: Insufficient documentation

## 2022-10-14 DIAGNOSIS — I4891 Unspecified atrial fibrillation: Secondary | ICD-10-CM

## 2022-10-14 DIAGNOSIS — Z7901 Long term (current) use of anticoagulants: Secondary | ICD-10-CM | POA: Insufficient documentation

## 2022-10-14 DIAGNOSIS — R0602 Shortness of breath: Secondary | ICD-10-CM | POA: Insufficient documentation

## 2022-10-14 DIAGNOSIS — I350 Nonrheumatic aortic (valve) stenosis: Secondary | ICD-10-CM

## 2022-10-14 DIAGNOSIS — I5022 Chronic systolic (congestive) heart failure: Secondary | ICD-10-CM

## 2022-10-14 DIAGNOSIS — Z95810 Presence of automatic (implantable) cardiac defibrillator: Secondary | ICD-10-CM | POA: Insufficient documentation

## 2022-10-14 DIAGNOSIS — I472 Ventricular tachycardia, unspecified: Secondary | ICD-10-CM | POA: Insufficient documentation

## 2022-10-14 DIAGNOSIS — K5730 Diverticulosis of large intestine without perforation or abscess without bleeding: Secondary | ICD-10-CM | POA: Diagnosis not present

## 2022-10-14 DIAGNOSIS — I6529 Occlusion and stenosis of unspecified carotid artery: Secondary | ICD-10-CM | POA: Diagnosis present

## 2022-10-14 DIAGNOSIS — I480 Paroxysmal atrial fibrillation: Secondary | ICD-10-CM | POA: Insufficient documentation

## 2022-10-14 DIAGNOSIS — E039 Hypothyroidism, unspecified: Secondary | ICD-10-CM | POA: Diagnosis not present

## 2022-10-14 DIAGNOSIS — N1832 Chronic kidney disease, stage 3b: Secondary | ICD-10-CM | POA: Diagnosis present

## 2022-10-14 DIAGNOSIS — R55 Syncope and collapse: Secondary | ICD-10-CM

## 2022-10-14 DIAGNOSIS — Z8709 Personal history of other diseases of the respiratory system: Secondary | ICD-10-CM | POA: Diagnosis not present

## 2022-10-14 DIAGNOSIS — I200 Unstable angina: Secondary | ICD-10-CM

## 2022-10-14 DIAGNOSIS — R0600 Dyspnea, unspecified: Secondary | ICD-10-CM | POA: Diagnosis not present

## 2022-10-14 DIAGNOSIS — I255 Ischemic cardiomyopathy: Secondary | ICD-10-CM | POA: Insufficient documentation

## 2022-10-14 DIAGNOSIS — E782 Mixed hyperlipidemia: Secondary | ICD-10-CM | POA: Diagnosis not present

## 2022-10-14 DIAGNOSIS — I712 Thoracic aortic aneurysm, without rupture, unspecified: Secondary | ICD-10-CM | POA: Diagnosis not present

## 2022-10-14 LAB — CBC WITH DIFFERENTIAL/PLATELET
Abs Immature Granulocytes: 0.07 10*3/uL (ref 0.00–0.07)
Basophils Absolute: 0.1 10*3/uL (ref 0.0–0.1)
Basophils Relative: 1 %
Eosinophils Absolute: 0.2 10*3/uL (ref 0.0–0.5)
Eosinophils Relative: 3 %
HCT: 45 % (ref 39.0–52.0)
Hemoglobin: 14.7 g/dL (ref 13.0–17.0)
Immature Granulocytes: 1 %
Lymphocytes Relative: 16 %
Lymphs Abs: 1.4 10*3/uL (ref 0.7–4.0)
MCH: 31.6 pg (ref 26.0–34.0)
MCHC: 32.7 g/dL (ref 30.0–36.0)
MCV: 96.8 fL (ref 80.0–100.0)
Monocytes Absolute: 0.9 10*3/uL (ref 0.1–1.0)
Monocytes Relative: 10 %
Neutro Abs: 6.3 10*3/uL (ref 1.7–7.7)
Neutrophils Relative %: 69 %
Platelets: 152 10*3/uL (ref 150–400)
RBC: 4.65 MIL/uL (ref 4.22–5.81)
RDW: 14.2 % (ref 11.5–15.5)
WBC: 8.9 10*3/uL (ref 4.0–10.5)
nRBC: 0 % (ref 0.0–0.2)

## 2022-10-14 LAB — COMPREHENSIVE METABOLIC PANEL
ALT: 17 U/L (ref 0–44)
AST: 24 U/L (ref 15–41)
Albumin: 3.8 g/dL (ref 3.5–5.0)
Alkaline Phosphatase: 99 U/L (ref 38–126)
Anion gap: 11 (ref 5–15)
BUN: 32 mg/dL — ABNORMAL HIGH (ref 8–23)
CO2: 24 mmol/L (ref 22–32)
Calcium: 9.6 mg/dL (ref 8.9–10.3)
Chloride: 101 mmol/L (ref 98–111)
Creatinine, Ser: 1.47 mg/dL — ABNORMAL HIGH (ref 0.61–1.24)
GFR, Estimated: 45 mL/min — ABNORMAL LOW (ref >60)
Glucose, Bld: 103 mg/dL — ABNORMAL HIGH (ref 70–99)
Potassium: 4.2 mmol/L (ref 3.5–5.1)
Sodium: 136 mmol/L (ref 135–145)
Total Bilirubin: 0.7 mg/dL (ref 0.3–1.2)
Total Protein: 7.3 g/dL (ref 6.5–8.1)

## 2022-10-14 LAB — TROPONIN I (HIGH SENSITIVITY)
Troponin I (High Sensitivity): 9 ng/L (ref <18)
Troponin I (High Sensitivity): 9 ng/L (ref <18)

## 2022-10-14 LAB — RESP PANEL BY RT-PCR (RSV, FLU A&B, COVID)  RVPGX2
Influenza A by PCR: NEGATIVE
Influenza B by PCR: NEGATIVE
Resp Syncytial Virus by PCR: NEGATIVE
SARS Coronavirus 2 by RT PCR: NEGATIVE

## 2022-10-14 LAB — BRAIN NATRIURETIC PEPTIDE: B Natriuretic Peptide: 202 pg/mL — ABNORMAL HIGH (ref 0.0–100.0)

## 2022-10-14 LAB — APTT: aPTT: 30 seconds (ref 24–36)

## 2022-10-14 LAB — HEPARIN LEVEL (UNFRACTIONATED): Heparin Unfractionated: >1.1 IU/mL — ABNORMAL HIGH (ref 0.30–0.70)

## 2022-10-14 MED ORDER — HEPARIN (PORCINE) 25000 UT/250ML-% IV SOLN
1450.0000 [IU]/h | INTRAVENOUS | Status: DC
  Administered 2022-10-14: 1450 [IU]/h via INTRAVENOUS
  Filled 2022-10-14: qty 250

## 2022-10-14 MED ORDER — SPIRONOLACTONE 12.5 MG HALF TABLET: 25.0000 mg | ORAL_TABLET | Freq: Every day | ORAL | Status: DC

## 2022-10-14 MED ORDER — IOHEXOL 350 MG/ML SOLN
60.0000 mL | Freq: Once | INTRAVENOUS | Status: AC | PRN
  Administered 2022-10-14: 60 mL via INTRAVENOUS

## 2022-10-14 MED ORDER — FUROSEMIDE 10 MG/ML IJ SOLN
20.0000 mg | Freq: Once | INTRAMUSCULAR | Status: AC
  Administered 2022-10-14: 20 mg via INTRAVENOUS
  Filled 2022-10-14: qty 2

## 2022-10-14 MED ORDER — ASPIRIN 81 MG PO CHEW
81.0000 mg | CHEWABLE_TABLET | ORAL | Status: AC
  Administered 2022-10-15: 81 mg via ORAL
  Filled 2022-10-14: qty 1

## 2022-10-14 MED ORDER — ASPIRIN 81 MG PO CHEW
324.0000 mg | CHEWABLE_TABLET | Freq: Once | ORAL | Status: AC
  Administered 2022-10-14: 324 mg via ORAL
  Filled 2022-10-14: qty 4

## 2022-10-14 MED ORDER — SODIUM CHLORIDE 0.9% FLUSH
3.0000 mL | Freq: Two times a day (BID) | INTRAVENOUS | Status: DC
  Administered 2022-10-15: 3 mL via INTRAVENOUS

## 2022-10-14 MED ORDER — SODIUM CHLORIDE 0.9 % IV SOLN: INTRAVENOUS | Status: DC

## 2022-10-14 NOTE — H&P (Addendum)
Cardiology Admission History and Physical   Patient ID: [name] MRN: [id number]; DOB: [birth year]   Admission date: 10/14/2022  PCP:  [facility], [facility] Providers Cardiologist:  None  Electrophysiologist:  Eric Epley, MD  Advanced Heart Failure:  Eric Champagne, MD       Chief Complaint:  Chest pain  Patient Profile:   [facility] is a 89 y.o. male with CAD s/Gordon CABG 2012 (NSTEMI 1/20 medically managed), chronotropic incompetence, PAF on Eliquis, history of pneumothorax 4/20 with subsequent recurrent left pleural effusion s/Gordon VATS 7/20, coronary artery disease s/Gordon bilateral TCAR, AAA s/Gordon EVAR 07/2022, orthostatic hypotension on midodrine, history of VT on dual-chamber Biotronik ICD and syncope who is being seen 10/14/2022 for the evaluation of chest pain.  History of Present Illness:   [facility] is a 89 year old male with past medical history of CAD s/Gordon CABG 2012 (NSTEMI 1/20 medically managed), chronotropic incompetence, PAF on Eliquis, history of pneumothorax 4/20 with subsequent recurrent left pleural effusion s/Gordon VATS 7/20, coronary artery disease s/Gordon bilateral TCAR, AAA s/Gordon EVAR 07/2022, orthostatic hypotension on midodrine, history of VT on dual-chamber Biotronik ICD and syncope.  He has chronic dyspnea on exertion, symptoms was not alleviated by bypass surgery.  Previous PFT in March 2014 demonstrated only mild obstructive airway disease.  VQ scan showed no PE.  EF was normal at the time.  Right heart cath performed in February 2015 showed normal left and right filling pressure and normal PA pressure.  Cardiopulmonary stress test in March 2015 showed normal capacity compared to age matched sedentary norms, he did have chronotropic incompetence.    He had NSTEMI in January 2020.  Cardiac catheterization performed on 10/22/2018 demonstrated 70% mid to distal RCA lesion, DFR was insignificant at 0.93, occluded distal limb of previously  placed left circumflex graft.  Medical therapy was recommended.  Echocardiogram at that time showed EF 55 to 60%, normal RV.  CTA showed no PE.  He was noted to be volume overloaded and now underwent diuresis.  He was also noted to be in transient A-fib.  Aspirin and Plavix stopped, he was placed on Eliquis.  Due to recurrence of A-fib, he was started on amiodarone therapy.  He was readmitted in April 2020 with spontaneous left pneumothorax requiring chest tube.  Afterward, he developed a recurrent left pleural effusion requiring multiple thoracentesis.  Pleural fluid was exudative with eosinophil predominance.  With elevated ESR, it was concerned that amiodarone could be contributing, therefore amiodarone was stopped.  CT of the chest showed left lower lobe consolidation and collapse.  He eventually underwent a left lung VATS procedure in July 2020.  Carotid Doppler in September 2021 showed severe disease bilaterally.  Patient ultimately underwent R TCAR 12/21 and left TCAR 5/22.  Myoview in December 2021 showed EF 41%, no ischemia, fixed inferolateral defect.  Echocardiogram obtained in January 2022 shows EF 40 to 45%, basal to mid inferolateral akinesis and basal inferior hypokinesis.  Last myoview obtained in March 2023 showed no ischemia, prior inferolateral and inferior infarct.  He was admitted in June with syncope. Echocardiogram obtained in June 2023 showed EF 40 to 45%, mild to moderate aortic stenosis.  EP study demonstrating inducible VT.  Patient underwent dual-chamber Biotronik ICD implantation.  He had VT again in September 2023 treated with ATP.  Mexiletine was started.  Recent device interrogation in December 2023 showed increased A-fib burden of 97.5%.  3 weeks ago, he had increasing  weakness, shortness of breath and chest discomfort.  He did a home test which came back positive for COVID.  However the following day, he went to [facility] and the had 2 COVID test both came back negative.  He  denies any fever, chill but does have a mild dry cough.  He was seen in the heart failure office today when he complained of increasing chest discomfort and was sent to the emergency room.  Talking with the patient, over the past 3 weeks, his chest discomfort has been increasing frequency and duration.  He is not very active.  He has chronic shortness of breath and fatigue, however both has been worsening in the past 3 weeks.  On arrival to [facility], blood pressure was normal at 118/71.  O2 saturation 96%.  Heart rate 60s.  Blood work showed a creatinine of 1.47.  Normal sodium and potassium.  BNP 202.  Serial troponin negative x 2.  White blood cell count normal.  EKG showed atrial paced rhythm with right bundle branch block.   Past Medical History:  Diagnosis Date   Abdominal aortic aneurysm (AAA) (HCC)    Achilles tendinitis of right lower extremity    AICD (automatic cardioverter/defibrillator) present    Arthritis    "my whole body" (03/19/2018)   Atherosclerosis of coronary artery bypass graft w/o angina pectoris    Atrial fibrillation (HCC)    Barrett's esophagus    CAD (coronary artery disease)    Carotid artery disease (HCC)    CHF (congestive heart failure) (HCC)    Chronic anticoagulation    PE   Chronic lower back pain    Dyspnea    Dysrhythmia    Essential tremor    GERD (gastroesophageal reflux disease)    High cholesterol    Hyperlipidemia    Hyperplasia, prostate    Hypertension    Hypothyroidism    Myocardial infarction ([place])    2 in Jan. 2019   Nail dystrophy    OSA on CPAP    uses CPAP   Osteoarthrosis    Pneumonia    "now and once before" (03/19/2018)   Pulmonary embolism [facility])    April 2012 after CABG   S/Gordon CABG (coronary artery bypass graft)    Sleep apnea    Spontaneous pneumothorax    left spontaneous pneumothorax/left pleural effusion, s/Gordon chest tube 01/09/19; thoracentesis x2, s/Gordon left VATS/tacl pleurodesis 04/12/19    Past Surgical  History:  Procedure Laterality Date   ABDOMINAL AORTIC ENDOVASCULAR STENT GRAFT N/A 08/06/2022   Procedure: ABDOMINAL AORTIC ENDOVASCULAR STENT GRAFT;  Surgeon: Serafina Mitchell, MD;  Location: [place];  Service: Vascular;  Laterality: N/A;   ACHILLES TENDON REPAIR Bilateral    2006 & 2004   CARDIAC CATHETERIZATION  11/2010   CORONARY ANGIOGRAPHY N/A 10/22/2018   Procedure: CORONARY ANGIOGRAPHY;  Surgeon: Troy Sine, MD;  Location: [place] CV LAB;  Service: Cardiovascular;  Laterality: N/A;   CORONARY ARTERY BYPASS GRAFT  March 2012   CABG X 5   ESOPHAGOGASTRODUODENOSCOPY (EGD) WITH PROPOFOL N/A 12/10/2020   Procedure: ESOPHAGOGASTRODUODENOSCOPY (EGD) WITH PROPOFOL;  Surgeon: Doran Stabler, MD;  Location: [facility];  Service: Gastroenterology;  Laterality: N/A;   ICD IMPLANT N/A 02/25/2022   Procedure: ICD IMPLANT;  Surgeon: Eric Epley, MD;  Location: [place] CV LAB;  Service: Cardiovascular;  Laterality: N/A;   INGUINAL HERNIA REPAIR Left 1980   INTRAVASCULAR PRESSURE WIRE/FFR STUDY N/A 10/22/2018   Procedure:  INTRAVASCULAR PRESSURE WIRE/FFR STUDY;  Surgeon: Troy Sine, MD;  Location: [place] CV LAB;  Service: Cardiovascular;  Laterality: N/A;   IR THORACENTESIS ASP PLEURAL SPACE W/IMG GUIDE  02/04/2019   IR THORACENTESIS ASP PLEURAL SPACE W/IMG GUIDE  02/24/2019   KNEE ARTHROSCOPY Left 2006   LEFT HEART CATH AND CORS/GRAFTS ANGIOGRAPHY N/A 10/18/2018   Procedure: LEFT HEART CATH AND CORS/GRAFTS ANGIOGRAPHY;  Surgeon: Lorretta Harp, MD;  Location: [place] CV LAB;  Service: Cardiovascular;  Laterality: N/A;   LEFT HEART CATH AND CORS/GRAFTS ANGIOGRAPHY N/A 10/21/2018   Procedure: LEFT HEART CATH AND CORS/GRAFTS ANGIOGRAPHY;  Surgeon: Troy Sine, MD;  Location: [place] CV LAB;  Service: Cardiovascular;  Laterality: N/A;   PACEMAKER IMPLANT N/A 02/24/2022   Procedure: PACEMAKER IMPLANT;  Surgeon: Deboraha Sprang, MD;  Location: [place] CV LAB;   Service: Cardiovascular;  Laterality: N/A;   PENILE PROSTHESIS IMPLANT     PLEURAL BIOPSY Left 04/12/2019   Procedure: Pleural Biopsy;  Surgeon: Grace Isaac, MD;  Location: [place];  Service: Thoracic;  Laterality: Left;   PLEURAL EFFUSION DRAINAGE Left 04/12/2019   Procedure: Drainage Of Pleural Effusion;  Surgeon: Grace Isaac, MD;  Location: [place];  Service: Thoracic;  Laterality: Left;   RIGHT HEART CATHETERIZATION N/A 11/14/2013   Procedure: RIGHT HEART CATH;  Surgeon: Larey Dresser, MD;  Location: [facility] CATH LAB;  Service: Cardiovascular;  Laterality: N/A;   SHOULDER OPEN ROTATOR CUFF REPAIR Right 2003   TALC PLEURODESIS Left 04/12/2019   Procedure: Pietro Cassis;  Surgeon: Grace Isaac, MD;  Location: [place];  Service: Thoracic;  Laterality: Left;   TEE WITHOUT CARDIOVERSION N/A 03/24/2019   Procedure: TRANSESOPHAGEAL ECHOCARDIOGRAM (TEE);  Surgeon: Larey Dresser, MD;  [facility]: [facility];  Service: Cardiovascular;  Laterality: N/A;   TEE WITHOUT CARDIOVERSION  04/12/2019   Procedure: Transesophageal Echocardiogram (Tee);  Surgeon: Grace Isaac, MD;  Location: [facility] OR;  Service: Thoracic;;   THORACOSCOPY Left 04/12/2019   VIDEO BRONCHOSCOPY (N/A ) VIDEO ASSISTED THORACOSCOPY (Left Chest) TALC PLEURADESIS (Left    TRANSCAROTID ARTERY REVASCULARIZATION  Right 09/13/2020   Procedure: RIGHT TRANSCAROTID ARTERY REVASCULARIZATION USING 51m X 43mENROUTE TRANSCAROTID STEND SYSTEM;  Surgeon: BrSerafina MitchellMD;  [place] [facility] Service: Vascular;  Laterality: Right;   TRANSCAROTID ARTERY REVASCULARIZATION  Left 02/08/2021   Procedure: LEFT TRANSCAROTID ARTERY REVASCULARIZATION;  Surgeon: BrSerafina MitchellMD;  [place] Service: Vascular;  Laterality: Left;   ULTRASOUND GUIDANCE FOR VASCULAR ACCESS  09/13/2020   Procedure: ULTRASOUND GUIDANCE FOR VASCULAR ACCESS;  Surgeon: BrSerafina MitchellMD;  [place] [facility] Service: Vascular;;   ULTRASOUND GUIDANCE FOR  VASCULAR ACCESS Bilateral 08/06/2022   Procedure: ULTRASOUND GUIDANCE FOR VASCULAR ACCESS;  Surgeon: BrSerafina MitchellMD;  [place] [facility] Service: Vascular;  Laterality: Bilateral;   VIDEO ASSISTED THORACOSCOPY Left 04/12/2019   Procedure: VIDEO ASSISTED THORACOSCOPY;  Surgeon: GeGrace IsaacMD;  [place] [facility] Service: Thoracic;  Laterality: Left;   VIDEO BRONCHOSCOPY N/A 04/12/2019   Procedure: VIDEO BRONCHOSCOPY;  Surgeon: GeGrace IsaacMD;  [facility];  Service: Thoracic;  Laterality: N/A;     Medications Prior to Admission: Prior to Admission medications   Medication Sig Start Date End Date Taking? Authorizing Provider  apixaban (ELIQUIS) 5 MG TABS tablet Take 1 tablet (5 mg total) by mouth 2 (two) times daily. Restart after 5 days on 6/12 03/03/22  Yes  Domenic Polite, MD  atorvastatin (LIPITOR) 80 MG tablet Take 1 tablet (80 mg total) by mouth daily. 11/01/18  Yes Larey Dresser, MD  cetirizine (ZYRTEC) 10 MG tablet Take 10 mg by mouth daily.   Yes [provider]  Cholecalciferol (VITAMIN D-3) 25 MCG (1000 UT) CAPS Take 1,000 Units by mouth in the morning.   Yes [provider]  finasteride (PROSCAR) 5 MG tablet Take 5 mg by mouth daily. 08/20/22  Yes [provider]  furosemide (LASIX) 20 MG tablet Take 1 tablet (20 mg total) by mouth daily. 07/17/22  Yes Larey Dresser, MD  isosorbide mononitrate (IMDUR) 30 MG 24 hr tablet Take 0.5 tablets (15 mg total) by mouth daily. 07/17/22  Yes Larey Dresser, MD  levothyroxine (SYNTHROID) 50 MCG tablet Take 50 mcg by mouth at bedtime.   Yes [provider]  metoprolol succinate (TOPROL XL) 25 MG 24 hr tablet Take 1 tablet (25 mg total) by mouth daily. 07/17/22  Yes Larey Dresser, MD  mexiletine (MEXITIL) 150 MG capsule Take 1 capsule (150 mg total) by mouth 2 (two) times daily. 07/17/22  Yes Larey Dresser, MD  midodrine (PROAMATINE) 2.5 MG tablet Take 1 tablet (2.5 mg total) by  mouth 2 (two) times daily with a meal. 07/07/22  Yes Baldwin Jamaica, PA-C  Multiple Vitamin (MULTIVITAMIN WITH MINERALS) TABS tablet Take 1 tablet by mouth in the morning.   Yes [provider]  pantoprazole (PROTONIX) 40 MG tablet Take 40 mg by mouth daily before breakfast.   Yes [provider]  spironolactone (ALDACTONE) 25 MG tablet Take 1 tablet (25 mg total) by mouth at bedtime. 05/23/22  Yes Larey Dresser, MD  tamsulosin (FLOMAX) 0.4 MG CAPS capsule Take 0.4 mg by mouth every evening.   Yes [provider]  traMADol (ULTRAM) 50 MG tablet Take 50 mg by mouth every 6 (six) hours as needed for moderate pain. 02/26/22  Yes [provider]  oxyCODONE-acetaminophen (PERCOCET/ROXICET) 5-325 MG tablet Take 1 tablet by mouth every 6 (six) hours as needed for moderate pain. Patient not taking: Reported on 10/14/2022 08/07/22   Eric Serene P, PA-C     Allergies:    Allergies  Allergen Reactions   Ancef [Cefazolin Sodium] Hives   Empagliflozin Other (See Comments)    Dizziness   Vancomycin Rash    Social History:   Social History   Socioeconomic History   Marital status: Married    Spouse name: Not on file   Number of children: 2   Years of education: Not on file   Highest education level: Not on file  Occupational History   Occupation: Scientist, clinical (histocompatibility and immunogenetics): [employer]  Tobacco Use   Smoking status: Former    Packs/day: 1.00    Years: 35.00    Total pack years: 35.00    Types: Cigarettes    Quit date: 02/18/1984    Years since quitting: 38.6   Smokeless tobacco: Never  Vaping Use   Vaping Use: Never used  Substance and Sexual Activity   Alcohol use: Yes    Alcohol/week: 2.0 standard drinks of alcohol    Types: 2 Standard drinks or equivalent per week   Drug use: Never   Sexual activity: Not on file  Other Topics Concern   Not on file  Social History Narrative   Not on file   Social Determinants of Health   Financial Resource Strain:  Not on file  Food Insecurity: No Food Insecurity (08/06/2022)   Hunger Vital Sign    Worried About Running Out of Food in the Last Year: Never true    Ran Out of Food in the Last Year: Never true  Transportation Needs: No Transportation Needs (08/06/2022)   PRAPARE - Hydrologist (Medical): No    Lack of Transportation (Non-Medical): No  Physical Activity: Not on file  Stress: Not on file  Social Connections: Not on file  Intimate Partner Violence: Not At Risk (08/06/2022)   Humiliation, Afraid, Rape, and Kick questionnaire    Fear of Current or Ex-Partner: No    Emotionally Abused: No    Physically Abused: No    Sexually Abused: No    Family History:   The patient's family history includes Coronary artery disease in his mother; Heart disease in his mother; Hypertension in his mother.    ROS:  Please see the history of present illness.  All other ROS reviewed and negative.     Physical Exam/Data:   Vitals:   10/14/22 1200 10/14/22 1215 10/14/22 1230 10/14/22 1404  BP: 111/62  121/62 126/69  Pulse: 63  61 66  Resp: 20 20 (!) 22 (!) 23  Temp: 97.6 F (36.4 C)   97.6 F (36.4 C)  TempSrc: Oral   Oral  SpO2: 97%  98% 98%  Weight:      Height:        Intake/Output Summary (Last 24 hours) at 10/14/2022 1519 Last data filed at 10/14/2022 1407 Gross per 24 hour  Intake --  Output 275 ml  Net -275 ml      10/14/2022    9:30 AM 10/14/2022    8:32 AM 09/01/2022   12:02 PM  Last 3 Weights  Weight (lbs) 203 lb 203 lb 204 lb  Weight (kg) 92.08 kg 92.08 kg 92.534 kg     Body mass index is 25.37 kg/m.  General:  Well nourished, well developed, in no acute distress HEENT: normal Neck: no JVD Vascular: No carotid bruits; Distal pulses 2+ bilaterally   Cardiac:  normal S1, S2; RRR; no murmur  Lungs:  clear to auscultation bilaterally, no wheezing, rhonchi or rales  Abd: soft, nontender, no hepatomegaly  Ext: no edema Musculoskeletal:  No  deformities, BUE and BLE strength normal and equal Skin: warm and dry  Neuro:  CNs 2-12 intact, no focal abnormalities noted Psych:  Normal affect    EKG:  The ECG that was done and was personally reviewed and demonstrates atrial paced rhythm  Relevant CV Studies:  Limited echo 02/23/2022 1. There is a small aneurysm of the basal inferolateral wall. Thrombus is  not seen. Left ventricular ejection fraction, by estimation, is 40 to 45%.  The left ventricle has mildly decreased function. The left ventricle  demonstrates regional wall motion  abnormalities (see scoring diagram/findings for description). There is  mild concentric left ventricular hypertrophy. Left ventricular diastolic  function could not be evaluated.   2. Right ventricular systolic function is normal. The right ventricular  size is normal. There is normal pulmonary artery systolic pressure. The  estimated right ventricular systolic pressure is 22.0 mmHg.   3. 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 A small  partly mobile calcified component may be present. The mitral valve is  degenerative. No evidence of mitral stenosis. Moderate to severe mitral  annular calcification.   4. Tricuspid valve regurgitation is mild to moderate.   5.  The aortic valve is tricuspid. There is moderate calcification of the  aortic valve. There is moderate thickening of the aortic valve. Aortic  valve regurgitation is not visualized. Mild to moderate aortic valve  stenosis.   6. There is borderline dilatation of the ascending aorta, measuring 39  mm.   7. The inferior vena cava is normal in size with greater than 50%  respiratory variability, suggesting right atrial pressure of 3 mmHg.   Comparison(s): No significant change from prior study. Prior images  reviewed side by side.   Laboratory Data:  High Sensitivity Troponin:   Recent Labs  Lab 10/14/22 0857 10/14/22 1017  TROPONINIHS 9 9      Chemistry Recent  Labs  Lab 10/14/22 1017  NA 136  K 4.2  CL 101  CO2 24  GLUCOSE 103*  BUN 32*  CREATININE 1.47*  CALCIUM 9.6  GFRNONAA 45*  ANIONGAP 11    Recent Labs  Lab 10/14/22 1017  PROT 7.3  ALBUMIN 3.8  AST 24  ALT 17  ALKPHOS 99  BILITOT 0.7   Lipids No results for input(s): "CHOL", "TRIG", "HDL", "LABVLDL", "LDLCALC", "CHOLHDL" in the last 168 hours. Hematology Recent Labs  Lab 10/14/22 1017  WBC 8.9  RBC 4.65  HGB 14.7  HCT 45.0  MCV 96.8  MCH 31.6  MCHC 32.7  RDW 14.2  PLT 152   Thyroid No results for input(s): "TSH", "FREET4" in the last 168 hours. BNP Recent Labs  Lab 10/14/22 1017  BNP 202.0*    DDimer No results for input(s): "DDIMER" in the last 168 hours.   Radiology/Studies:  DG Chest 2 [facility]  Result Date: 10/14/2022 CLINICAL DATA:  Cough and shortness of breath. EXAM: CHEST - 2 [facility] COMPARISON:  Chest two views 02/26/2022, CT chest 07/24/2022, CTA abdomen and pelvis 08/28/2022, CT chest 02/22/2022 FINDINGS: Left chest wall cardiac AICD with leads overlying the right atrium and right ventricle, similar to prior. Status post median sternotomy and CABG. Cardiac silhouette and mediastinal contours are within normal limits with moderate calcification again seen within the aortic arch. The right lung appears clear. Note is made of small left pleural effusion and basilar atelectasis within the left lower lung on 07/24/2022 and 08/28/2022 CTs, not as well seen on the most recent 02/26/2022 chest radiograph. There is similar left retrocardiac and costophrenic angle opacification as on 02/26/2022 prior frontal chest radiograph, and there may be minimal left basilar atelectasis and pleural fluid remaining. The right lung is clear. No pneumothorax. Moderate multilevel degenerative disc changes of the thoracic spine. IMPRESSION: The prior small left pleural effusion and left basilar atelectasis seen on 02/22/2022, 07/24/2022, and 08/28/2022 CTs was not well visualized on  prior 02/26/2022 radiographs. The appearance is similar on the current study, and there is again mild left retrocardiac and costophrenic angle opacification. This likely represents mild chronic left basilar atelectasis and pleural fluid. Electronically Signed   By: Yvonne Kendall M.D.   On: 10/14/2022 11:01     Assessment and Plan:   Chest pain/Unstable angina: Last cardiac catheterization 2017.  Patient complaining of worsening chest pain with worsening dyspnea on exertion and fatigue for the past 3 weeks.  Describes the chest discomfort as a left-sided chest discomfort radiating to the left shoulder.  It is not worse with deep inspiration, body rotation or palpation.  Patient's functional ability is very poor baseline.  Concerning feature is the increasing frequency and duration of chest discomfort for the past 3 weeks.  Will discuss  with MD regarding diagnostic cardiac catheterization.  Dyspnea with exertion: Euvolemic on exam.  Has chronic dyspnea on exertion at baseline.  Chest x-ray showed left basilar atelectasis and mild left retrocardiac and costophrenic angle opacification.  Patient has no fever, chill but does have a mild dry cough.  He reportedly had a positive COVID test more than 3 weeks ago, however he went to the [facility] and had 2 COVID test the following day which both came back negative.  Viral panel today negative for COVID, flu or RSV.  Patient does have increased A-fib burden on recent device interrogation, however he has never had worsening shortness of breath, worsening fatigue or chest discomfort with previous episode of A-fib.  Chronic systolic heart failure:  He is euvolemic on physical exam.  BNP 200 which is lower than the previous BMP.  CAD s/Gordon CABG 2012: Last cardiac catheterization in January 2020 demonstrated occluded distal limb of previously placed the left circumflex graft, 70% mid to distal RCA lesion.  DFR was insignificant  Persistent atrial fibrillation on  Eliquis: Increased A-fib burden on recent device interrogation.  History of VT on dual-chamber Biotronik ICD  Orthostatic hypotension: On midodrine  History of recurrent left pleural effusion s/Gordon VATS    Risk Assessment/Risk Scores:    TIMI Risk Score for Unstable Angina or Non-ST Elevation MI:   The patient's TIMI risk score is 4, which indicates a 20% risk of all cause mortality, new or recurrent myocardial infarction or need for urgent revascularization in the next 14 days.  New York Heart Association (NYHA) Functional Class NYHA Class III  CHA2DS2-VASc Score = 5   This indicates a 7.2% annual risk of stroke. The patient's score is based upon: CHF History: 1 HTN History: 1 Diabetes History: 0 Stroke History: 0 Vascular Disease History: 1 Age Score: 2 Gender Score: 0    Severity of Illness: The appropriate patient status for this patient is OBSERVATION. Observation status is judged to be reasonable and necessary in order to provide the required intensity of service to ensure the patient's safety. The patient's presenting symptoms, physical exam findings, and initial radiographic and laboratory data in the context of their medical condition is felt to place them at decreased risk for further clinical deterioration. Furthermore, it is anticipated that the patient will be medically stable for discharge from the hospital within 2 midnights of admission.    For questions or updates, please contact [place] Please consult [email or web address] for contact info under   Signed, Almyra Deforest, [place]  10/14/2022 3:19 PM  As above, patient seen and examined.  Briefly he is an 89 year old male with past medical history of coronary artery disease status post coronary artery bypass and graft, paroxysmal atrial fibrillation on chronic anticoagulation, ischemic cardiomyopathy, prior ICD, history of carotid artery disease, orthostatic hypotension for evaluation of dyspnea, fatigue and chest pain.   Patient states that over the past several weeks he has had worsening fatigue, dyspnea on exertion but no orthopnea, PND or pedal edema.  Has had intermittent chest pain that can last 40 minutes at a time.  It is described as a pulsating pain possibly similar to his previous cardiac pain.  No associated symptoms.  Resolves spontaneously.  Not exertional or pleuritic.  He was seen in the heart failure clinic today and sent to the emergency room for active chest pain.  Chest x-ray shows chronic mild left basilar atelectasis/pleural effusion unchanged.  Creatinine 1.47, BUN 32, BNP 202, troponin 9 and 9,  hemoglobin 14.7.  Electrocardiogram shows atrial pacing with right bundle branch block and lateral infarct.  1 chest pain-symptoms with both typical and atypical features.  However they are progressively and occurring at rest.  I discussed the options of stress nuclear study versus cardiac catheterization for definitive evaluation.  We have elected to proceed with the latter.  The risk and benefits including myocardial infarction, CVA and death discussed and he agrees to proceed.  We also discussed the risk of contrast nephropathy given baseline renal insufficiency.  We will also plan right heart catheterization at that time given his symptoms of dyspnea.  He does not appear to be volume overloaded on examination.  2 paroxysmal atrial fibrillation-he is in atrial paced rhythm today.  Will hold apixaban prior to catheterization.  Continue Toprol.  3 coronary artery disease-continue Lipitor 80 mg daily.  Add aspirin 81 mg daily prior to catheterization.  4 ischemic cardiomyopathy-continue Toprol.  He is not on an ARB as he has had difficulties with orthostasis requiring midodrine in the past.  5 chronic combined systolic/diastolic congestive heart failure-he appears to be euvolemic on examination.  Hold Lasix and spironolactone prior to catheterization and resume after.  He did not tolerate SGLT2 inhibitor in  the past.  6 prior ICD  7 chronic stage IIIa kidney disease-follow renal function closely after catheterization.  8 orthostasis-continue midodrine  Kirk Ruths, MD

## 2022-10-14 NOTE — ED Triage Notes (Signed)
Pt. Stated, I've been SOB, a little chest pain and feeling weak. This started 3 weeks ago.

## 2022-10-14 NOTE — ED Provider Notes (Signed)
[facility] Provider Note   CSN: [id number] Arrival date & time: 10/14/22  0175     History  Chief Complaint  Patient presents with   Chest Pain   Shortness of Breath   Weakness    Eric Gordon is a 89 y.o. male.  Patient with a history of CABG, aortic aneurysm status postrepair, atrial fibrillation on Eliquis, CHF, pneumothorax, AICD in place presenting from cardiology office with a several week history of generalized weakness, fatigue, chest pain and shortness of breath.  He states he has had central chest pain intermittently for the past 3 weeks that comes and goes.  Pain is in the center of his chest and feels like a pressure.  Lasting for several minutes to hours at a time.  Having increased dyspnea especially with exertion.  He did have a home test positive for COVID 2 weeks ago was subsequently tested negative at the [place].  He feels weak all over and short of breath with exertional chest pain and pressure.  Denies significant cough, headache, runny nose or sore throat.  Denies any abdominal pain, nausea or vomiting.  Denies any focal weakness, numbness or tingling.  No known fever.  No headache. He was referred to the ED by his cardiologist today.  The history is provided by the patient and the spouse.  Chest Pain Associated symptoms: cough, fatigue, shortness of breath and weakness   Associated symptoms: no abdominal pain, no headache, no nausea and no vomiting   Shortness of Breath Associated symptoms: chest pain and cough   Associated symptoms: no abdominal pain, no headaches, no rash and no vomiting   Weakness Associated symptoms: arthralgias, chest pain, cough, myalgias and shortness of breath   Associated symptoms: no abdominal pain, no dysuria, no headaches, no nausea and no vomiting        Home Medications Prior to Admission medications   Medication Sig Start Date End Date Taking? Authorizing Provider   acetaminophen (TYLENOL) 500 MG tablet Take 1,000 mg by mouth every 6 (six) hours as needed for moderate pain or headache.    [provider]  apixaban (ELIQUIS) 5 MG TABS tablet Take 1 tablet (5 mg total) by mouth 2 (two) times daily. Restart after 5 days on 6/12 03/03/22   Domenic Polite, MD  atorvastatin (LIPITOR) 80 MG tablet Take 1 tablet (80 mg total) by mouth daily. 11/01/18   Larey Dresser, MD  Carboxymethylcellulose Sod PF 0.5 % SOLN Place 1 drop into both eyes 4 (four) times daily as needed (dry/irritated eyes.).    [provider]  Cholecalciferol (VITAMIN D-3) 25 MCG (1000 UT) CAPS Take 1,000 Units by mouth in the morning.    [provider]  fexofenadine (ALLEGRA) 180 MG tablet Take 180 mg by mouth in the morning.    [provider]  furosemide (LASIX) 20 MG tablet Take 1 tablet (20 mg total) by mouth daily. 07/17/22   Larey Dresser, MD  isosorbide mononitrate (IMDUR) 30 MG 24 hr tablet Take 0.5 tablets (15 mg total) by mouth daily. 07/17/22   Larey Dresser, MD  levothyroxine (SYNTHROID) 50 MCG tablet Take 50 mcg by mouth at bedtime.    [provider]  metoprolol succinate (TOPROL XL) 25 MG 24 hr tablet Take 1 tablet (25 mg total) by mouth daily. 07/17/22   Larey Dresser, MD  mexiletine (MEXITIL) 150 MG capsule Take 1 capsule (150 mg total) by mouth 2 (two)  times daily. 07/17/22   Larey Dresser, MD  midodrine (PROAMATINE) 2.5 MG tablet Take 1 tablet (2.5 mg total) by mouth 2 (two) times daily with a meal. 07/07/22   Baldwin Jamaica, PA-C  Multiple Vitamin (MULTIVITAMIN WITH MINERALS) TABS tablet Take 1 tablet by mouth in the morning.    [provider]  oxyCODONE-acetaminophen (PERCOCET/ROXICET) 5-325 MG tablet Take 1 tablet by mouth every 6 (six) hours as needed for moderate pain. 08/07/22   Schuh, McKenzi P, PA-C  pantoprazole (PROTONIX) 40 MG tablet Take 40 mg by mouth daily before breakfast.    [provider]  spironolactone (ALDACTONE) 25 MG tablet Take 1 tablet (25 mg total) by mouth at bedtime. 05/23/22   Larey Dresser, MD  tamsulosin (FLOMAX) 0.4 MG CAPS capsule Take 0.4 mg by mouth every evening.    [provider]      Allergies    Ancef [cefazolin sodium], Empagliflozin, and Vancomycin    Review of Systems   Review of Systems  Constitutional:  Positive for activity change, appetite change and fatigue.  HENT:  Positive for congestion. Negative for rhinorrhea.   Respiratory:  Positive for cough, chest tightness and shortness of breath.   Cardiovascular:  Positive for chest pain.  Gastrointestinal:  Negative for abdominal pain, nausea and vomiting.  Genitourinary:  Negative for dysuria and hematuria.  Musculoskeletal:  Positive for arthralgias and myalgias.  Skin:  Negative for rash.  Neurological:  Positive for weakness. Negative for headaches.   all other systems are negative except as noted in the HPI and PMH.    Physical Exam Updated Vital Signs BP 118/71 (BP Location: Right Arm)   Pulse 68   Temp 97.6 F (36.4 C) (Oral)   Resp 16   Ht '6\' 3"'$  (1.905 m)   Wt 92.1 kg   SpO2 96%   BMI 25.37 kg/m  Physical Exam Vitals and nursing note reviewed.  Constitutional:      General: He is not in acute distress.    Appearance: He is well-developed.  HENT:     Head: Normocephalic and atraumatic.     Mouth/Throat:     Pharynx: No oropharyngeal exudate.  Eyes:     Conjunctiva/sclera: Conjunctivae normal.     Pupils: Pupils are equal, round, and reactive to light.  Neck:     Comments: No meningismus. Cardiovascular:     Rate and Rhythm: Normal rate and regular rhythm.     Heart sounds: Normal heart sounds. No murmur heard. Pulmonary:     Effort: Pulmonary effort is normal. No respiratory distress.     Breath sounds: Normal breath sounds.  Chest:     Chest wall: No tenderness.  Abdominal:     Palpations: Abdomen is soft.     Tenderness: There is no  abdominal tenderness. There is no guarding or rebound.  Musculoskeletal:        General: No tenderness. Normal range of motion.     Cervical back: Normal range of motion and neck supple.     Right lower leg: Edema present.     Left lower leg: Edema present.  Skin:    General: Skin is warm.     Capillary Refill: Capillary refill takes less than 2 seconds.  Neurological:     General: No focal deficit present.     Mental Status: He is alert and oriented to person, place, and time. Mental status is at baseline.     Cranial Nerves: No cranial  nerve deficit.     Motor: No abnormal muscle tone.     Coordination: Coordination normal.     Comments: No ataxia on finger to nose bilaterally. No pronator drift. 5/5 strength throughout. CN 2-12 intact.Equal grip strength. Sensation intact.   Psychiatric:        Behavior: Behavior normal.     ED Results / Procedures / Treatments   Labs (all labs ordered are listed, but only abnormal results are displayed) Labs Reviewed  COMPREHENSIVE METABOLIC PANEL - Abnormal; Notable for the following components:      Result Value   Glucose, Bld 103 (*)    BUN 32 (*)    Creatinine, Ser 1.47 (*)    GFR, Estimated 45 (*)    All other components within normal limits  BRAIN NATRIURETIC PEPTIDE - Abnormal; Notable for the following components:   B Natriuretic Peptide 202.0 (*)    All other components within normal limits  RESP PANEL BY RT-PCR (RSV, FLU A&B, COVID)  [id number]  CBC WITH DIFFERENTIAL/PLATELET  TROPONIN I (HIGH SENSITIVITY)  TROPONIN I (HIGH SENSITIVITY)    EKG EKG Interpretation  Date/Time:  Tuesday October 14 2022 09:27:31 EST Ventricular Rate:  76 PR Interval:  234 QRS Duration: 160 QT Interval:  438 QTC Calculation: 492 R Axis:   -65 Text Interpretation: Atrial-paced rhythm with prolonged AV conduction Right bundle branch block Left anterior fascicular block * Bifascicular block * Lateral infarct , age undetermined Abnormal ECG When  compared with ECG of 14-Oct-2022 08:37, PREVIOUS ECG IS PRESENT No significant change was found Confirmed by Ezequiel Essex [phone]) on 10/14/2022 9:54:15 AM  Radiology DG Chest 2 View  Result Date: 10/14/2022 CLINICAL DATA:  Cough and shortness of breath. EXAM: CHEST - 2 VIEW COMPARISON:  Chest two views 02/26/2022, CT chest 07/24/2022, CTA abdomen and pelvis 08/28/2022, CT chest 02/22/2022 FINDINGS: Left chest wall cardiac AICD with leads overlying the right atrium and right ventricle, similar to prior. Status post median sternotomy and CABG. Cardiac silhouette and mediastinal contours are within normal limits with moderate calcification again seen within the aortic arch. The right lung appears clear. Note is made of small left pleural effusion and basilar atelectasis within the left lower lung on 07/24/2022 and 08/28/2022 CTs, not as well seen on the most recent 02/26/2022 chest radiograph. There is similar left retrocardiac and costophrenic angle opacification as on 02/26/2022 prior frontal chest radiograph, and there may be minimal left basilar atelectasis and pleural fluid remaining. The right lung is clear. No pneumothorax. Moderate multilevel degenerative disc changes of the thoracic spine. IMPRESSION: The prior small left pleural effusion and left basilar atelectasis seen on 02/22/2022, 07/24/2022, and 08/28/2022 CTs was not well visualized on prior 02/26/2022 radiographs. The appearance is similar on the current study, and there is again mild left retrocardiac and costophrenic angle opacification. This likely represents mild chronic left basilar atelectasis and pleural fluid. Electronically Signed   By: Yvonne Kendall M.D.   On: 10/14/2022 11:01    Procedures Procedures    Medications Ordered in ED Medications  aspirin chewable tablet 324 mg (has no administration in time range)    ED Course/ Medical Decision Making/ A&P                             Medical Decision Making Amount and/or  Complexity of Data Reviewed Independent Historian: spouse Labs: ordered. Decision-making details documented in ED Course. Radiology: ordered and independent interpretation performed.  Decision-making details documented in ED Course. ECG/medicine tests: ordered and independent interpretation performed. Decision-making details documented in ED Course.  Risk OTC drugs. Prescription drug management. Decision regarding hospitalization.   Intermittent chest pressure, shortness of breath, fatigue.  Vitals are stable, no distress, no hypoxia or increased work of breathing.  EKG shows unchanged bifascicular block without acute ST changes  Patient given aspirin.  He has a normal troponin.  Chest x-ray does show small pleural effusions appear to be chronic.  Dose of IV Lasix given.  He has no hypoxia or increased work of breathing at rest.  Discussed with Dr. Stanford Breed of cardiology who will evaluate.  History is concerning for unstable angina.  Patient also may have atrial fibrillation contributing to his dyspnea.  COVID and flu testing today are negative.  Cardiology has seen patient and will plan admit for catheterization tomorrow.  He does not appear to be significantly volume overloaded.  CT scan will be obtained to confirm no pulmonary embolism ensure stability of his aortic repair.       Final Clinical Impression(s) / ED Diagnoses Final diagnoses:  Unstable angina [facility])    Rx / DC Orders ED Discharge Orders     None         Ramsey Guadamuz, Annie Main, MD 10/14/22 1553

## 2022-10-14 NOTE — Progress Notes (Addendum)
Advanced Heart Failure Clinic Note  Date:  10/14/2022   ID:  Eric Gordon, DOB [birth year], MRN [id number]   Provider location: [place] [facility] Type of Visit: Established patient  PCP:  Shon Baton, MD  EP: Dr. Quentin Ore HF Cardiologist:  Dr. Aundra Dubin   HPI: Eric Gordon is a 89 y.o. male with history of CAD s/p CABG.  He has had trouble long-term with exertional dyspnea.  Dyspnea triggered evaluation in 2012 leading to CABG but was not resolved by CABG.  PFTs in 3/14 showed only mild obstructive airways disease and V/Q scan showed no PE.  Echo in 9/14 showed normal LV systolic function with moderately dilated RV. Krupp in 2/15 showed normal right and left heart filling pressures and normal PA pressure.  Finally, he had a CPX in 3/15 that showed normal capacity compared to age-matched sedentary norms.  He was noted to have chronotropic incompetence.  At a prior appointment, I took him off metoprolol given chronotropic incompetence noted on CPX.  Dyspnea improved significantly with weight loss.  He had Cardiolite in 8/18 with EF 52%, no ischemia or infarction.    He was admitted in 1/20 with NSTEMI.  LHC showed new occlusion of the branch of SVG-ramus and OM that touched down on OM.  There were also serial 70% stenoses in the mid/distal RCA.  There was not thought to be an interventional option and patient was treated medically. He was discharged home but presented a couple days later with recurrent chest pain and dyspnea.  Cath was repeated showing no change from prior.  This admission, he had FFR of the RCA which did not suggest hemodynamic significance.  Echo showed EF 55-60%, normal RV.  CTA did not show a PE. He was noted to be volume overloaded and was diuresed.  He was also noted to be in atrial fibrillation with RVR transiently.  ASA/Plavix was stopped and Eliquis was started. He was discharged to SNF.  Atrial fibrillation recurred and he was started on amiodarone  with conversion back to NSR.   In 4/20, he was admitted with left spontaneous PTX requiring chest tube.  After this, he developed recurrent left pleural effusions requiring thoracentesis x 3 so far.  Pleural fluid was exudative with eosinophil predominance.  With elevated ESR, there was concern that amiodarone could be involved, so this medication was stopped.  CT chest in 6/20 showed LLL consolidation/collapse with small left effusion, there was a rim-enhancing lesion posterior to the heart between esophagus and left atrium, ?loculated fluid.  TEE was done in 7/20 and confirmed loculated pleural effusion behind the left atrium.  In 7/20, patient had VATS on the left.   Carotid dopplers in 9/21 showed 80-99% RICA stenosis, 80-99% LICA.  AAA [place] in 9/21 showed 4.5 cm AAA. Patient was referred to Dr. Trula Slade for evaluation => he has had TCAR on right in 12/21.  He had left TCAR in 5/22.   Cardiolite in 12/21 with EF 41%, no ischemia, fixed inferolateral defect. Echo in 1/22 showed EF 40-45%, basal to mid inferolateral AK, basal inferior HK, normal RV, mild aortic stenosis mean gradient 11 mmHg.  He was unable to tolerate dapagliflozin and is off it. He was lightheaded on low dose Entresto so this was stopped.   Zio monitor in 1/23 showed 25% atrial flutter.  He was referred to EP, by the time he saw EP, burden of atrial fibrillation was lower.   Cardiolite in 3/23 showed  prior inferolateral and inferior infarct, no ischemia.   Patient was admitted in 4/23 with atypical chest pain.  This likely was due to cough/bronchitis.  CTA chest showed inferolateral wall pseudoaneurysm, this is chronic.  Echo in 4/23 showed EF 40-45%, akinesis of the inferolateral wall, basal inferolateral pseudoaneurysm, moderate RV enlargement, normal RV systolic function, mild AS with mean gradient 10 mmHg.   Zio monitor in 5/23 showed 2% AF burden, improved from prior.     Follow up 5/23, stable NYHA II, euvolemic. Off several  meds due to orthostatic symptoms.  Admitted 6/23 with syncope, no evidence for ACS. 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 EP study with inducible VT. Underwent dual chamber Biotronik ICD placement. Discharged home, weight 208 lbs.  VT again in 9/23, treated with ATP.  Mexiletine recommended by EP.   Follow up 10/23, NYHA III and volume stable. GDMT limited by orthostatic symptoms and need for midodrine. Mexiletine started for recurrent VT.  S/p aortobi-iliac endograft repair of infrarenal abdominal aortic aneurysm 12/23.  Today he returns for an acute visit with his wife complaints of worsening dyspnea. Has felt weak x 3 weeks. He had a + home COVID test at that time. He was seen at [facility] 10/05/22 and COVID, RSV and Flu negative; he declined CXR. He is more short of breath walking short distances. + non-productive cough. Dizziness when he stands. Rare atypical chest pain. No fever, chills or sick contacts. Denies abnormal bleeding, palpitations, edema, or PND/Orthopnea. Appetite ok.Weight at home 198 pounds. Taking all medications. BP at home usually 120s/60s, more recently BP in 150's.   ECG (personally reviewed): a-paced w/ PVC, RBBB, 66 bpm   Labs (8/18): LDL 82, HDL 41, TSH normal, hgb 16.3, K 4.1, creatinine 1.05 Labs (2/20): LFTs normal, pro BNP 842 Labs (3/20): K 3.8, creatinine 1.48, TSH mildly elevated at 6.97, free T3 and free T4 normal Labs (4/20): K 3.9, creatinine 1.36 Labs (2/20): LDL 52 Labs (8/20): BNP 241, creatinine 1.21  Labs (9/20): BNP 244, K 4, creatinine 1.12 Labs (2/21): K 4.3, creatinine 1.26, BNP 271 Labs (6/21): K 4.2, creatinine 1.11, LDL 57 Labs (6/22): K 4.3, creatinine 1.16, LDL 49 Labs (8/22): K 4.3, creatinine 1.37 Labs (12/22): K 3.9, creatinine 1.18 Labs (4/23): K 4.7, creatinine 1.35, BNP 253 Labs (6/23): K 4.1, creatinine 0.95 => 1.28 Labs (10/23): K 4.4, creatinine 1.26 Labs (11/23): K 4.3, creatinine  1.28   PMH: 1. Essential tremor 2. CAD: s/p CABG in 3/12.   - Cardiolite (8/18): EF 52%, no ischemia/infarction.  - NSTEMI (1/20):  LHC showed new occlusion of the branch of SVG-ramus and OM that touched down on OM.  There were also serial 70% stenoses in the mid/distal RCA.  FFR of RCA was negative.  - Cardiolite (12/21): EF 41%, no ischemia, fixed inferolateral defect - Cardiolite (3/23): LV EF 37%, fixed basal-mid inferolateral/inferior defect, no ischemia.  3. Atrial fibrillation: Only noted post-op CABG.  4. PE: Post-op CABG in 2012.  5. AAA: 3.6 cm on [place] in 3/14.  3.6 cm on [place] in 3/15. 3.6 cm on [place] 8/16. 4.1 cm on [place] 4/17.  - AAA [place] (5/18): 4.1 cm AAA.  - AAA [place] (5/19): 4.1 cm AAA.  - AAA [place] (4/20): 4.1 cm AAA.  - AAA [place] (9/21): 4.5 cm AAA - AAA [place] (9/22): 4.5 cm AAA - AAA [place] (10/23): 4.9 cm AAA - s/p EVAR 12/23 6. OSA: On CPAP.  7. Carotid stenosis: Carotid dopplers (8/88) with 28-00% LICA stenosis.  Carotid dopplers (3/49) with 17-91% LICA stenosis. Carotid dopplers (3/16) with 60-79% RCIA stenosis, 50-56% LICA stenosis.  - Carotid dopplers (8/16) with 40-59% RICA stenosis, 97-94% LICA stenosis.  - Carotid dopplers (8/17) with 80-16% RICA, 55-37% LICA. - Carotid dopplers (9/18) with 48-27% RICA, 07-86% LICA.   - Carotid dopplers (9/19) with 75-44% RICA, 92-01% LICA.  - Carotid dopplers (9/20) with 60-79% BICA - Carotid dopplers (9/21): 00-71% RICA, 21-97% LICA - Right TCAR 58/83, left TCAR 5/22.  - Carotid dopplers (4/23): patent ICA stents.  8. Asthma 9. PNA x 2 10. Chronic diastolic CHF/dyspnea: Echo (9/14) with EF 60-65%, mild LVH, very mild AS with mean gradient 10 mmHg, RV moderately dilated.  PFTs (3/14) showed mild obstructive airways disease.  V/Q scan (3/14) with no PE.  ENT workup for upper respiratory causes was negative.  RHC (2/15) with mean RA 7, PA 32/12, mean PCWP 13, CI 3.18.  CPX (3/15) with peak VO2 16.4, VE/VCO2 33; normal when compared to age-matched  sedentary normals; chronotropic incompetence was noted.  Dyspnea was improved with weight loss.  - Echo (8/17): EF 60-65%, mild AS.  - Echo (1/20): EF 55-60%, normal RV size and systolic function.  - Echo (6/20): EF 55%, mild LVH, mild AS, normal RV size and systolic function, ?LA mass or mass impinging on posterior LA.  - TEE (7/20): EF 50-55%, mild LVH, basal inferolateral aneurysm, normal RV size/systolic function, loculated pleural fluid behind LA.  - Echo (1/22): EF 40-45%, basal to mid inferolateral AK, basal inferior HK, normal RV, mild aortic stenosis mean gradient 11 mmHg. - Echo (2/23): EF 45-50%, moderate LVH, normal RV, mild AS mean gradient 15 mmHg with IVC normal.  - Echo (4/23): EF 40-45%, akinesis of the inferolateral wall, basal inferolateral pseudoaneurysm, moderate RV enlargement, normal RV systolic function, mild AS with mean gradient 10 mmHg.  - Echo (6/23): EF 40-45% and pre-existing aneurysm of the basal inferolateral wall, mild-mod AS. 11. HTN 12. Aortic stenosis: Mild.  13. Ascending aortic aneurysm: CTA chest with 4.4 cm ascending aorta in 1/20.  - 4.2 cm ascending aorta on CT chest 6/20.  14. Atrial fibrillation: Paroxysmal.  15. CKD: Stage 3.  16. Left lung spontaneous PTX then recurrent left pleural effusion.  - Left VATS in 7/20.  17. Palpitations:  - Zio patch (6/20): NSR with 1 short NSVT run and few short SVT runs, no atrial fibrillation.  - Zio patch 2 wks (2/21): 6 short NSVT runs, 66 short SVT runs, rare PACs/PVCs.  - Zio patch (5/23): 2% AF burden, 4 NSVT runs longest 5 beats.  59. COVID-19 infection 4/22.  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  20. VT: Syncope in 6/23 with inducible VT. s/p dual chamber Biotronik ICD  Current Outpatient Medications  Medication Sig Dispense Refill   acetaminophen (TYLENOL) 500 MG tablet Take 1,000 mg by mouth every 6 (six) hours as needed for moderate pain or headache.     apixaban (ELIQUIS) 5 MG TABS tablet Take  1 tablet (5 mg total) by mouth 2 (two) times daily. Restart after 5 days on 6/12     atorvastatin (LIPITOR) 80 MG tablet Take 1 tablet (80 mg total) by mouth daily. 30 tablet 6   Carboxymethylcellulose Sod PF 0.5 % SOLN Place 1 drop into both eyes 4 (four) times daily as needed (dry/irritated eyes.).     Cholecalciferol (VITAMIN D-3) 25 MCG (1000 UT) CAPS Take 1,000 Units by mouth  in the morning.     fexofenadine (ALLEGRA) 180 MG tablet Take 180 mg by mouth in the morning.     furosemide (LASIX) 20 MG tablet Take 1 tablet (20 mg total) by mouth daily. 90 tablet 3   isosorbide mononitrate (IMDUR) 30 MG 24 hr tablet Take 0.5 tablets (15 mg total) by mouth daily.     levothyroxine (SYNTHROID) 50 MCG tablet Take 50 mcg by mouth at bedtime.     metoprolol succinate (TOPROL XL) 25 MG 24 hr tablet Take 1 tablet (25 mg total) by mouth daily.     mexiletine (MEXITIL) 150 MG capsule Take 1 capsule (150 mg total) by mouth 2 (two) times daily. 180 capsule 2   midodrine (PROAMATINE) 2.5 MG tablet Take 1 tablet (2.5 mg total) by mouth 2 (two) times daily with a meal. 180 tablet 3   Multiple Vitamin (MULTIVITAMIN WITH MINERALS) TABS tablet Take 1 tablet by mouth in the morning.     oxyCODONE-acetaminophen (PERCOCET/ROXICET) 5-325 MG tablet Take 1 tablet by mouth every 6 (six) hours as needed for moderate pain. 10 tablet 0   pantoprazole (PROTONIX) 40 MG tablet Take 40 mg by mouth daily before breakfast.     spironolactone (ALDACTONE) 25 MG tablet Take 1 tablet (25 mg total) by mouth at bedtime. 90 tablet 3   tamsulosin (FLOMAX) 0.4 MG CAPS capsule Take 0.4 mg by mouth every evening.     No current facility-administered medications for this encounter.   Allergies:   Ancef [cefazolin sodium], Empagliflozin, and Vancomycin   Social History:  The patient  reports that he quit smoking about 38 years ago. His smoking use included cigarettes. He has a 35.00 pack-year smoking history. He has never used smokeless  tobacco. He reports current alcohol use of about 2.0 standard drinks of alcohol per week. He reports that he does not use drugs.   Family History:  The patient's family history includes Coronary artery disease in his mother; Heart disease in his mother; Hypertension in his mother.   ROS:  Please see the history of present illness.   All other systems are personally reviewed and negative.   Recent Labs: 02/23/2022: TSH 2.442 07/17/2022: B Natriuretic Peptide 261.9 08/04/2022: ALT 14 08/06/2022: Magnesium 1.8 08/07/2022: BUN 20; Creatinine, Ser 1.28; Hemoglobin 12.5; Platelets 108; Potassium 4.3; Sodium 135  Personally reviewed   BP 122/80   Pulse 85   Wt 92.1 kg (203 lb)   SpO2 96%   BMI 25.37 kg/m   Wt Readings from Last 3 Encounters:  10/14/22 92.1 kg (203 lb)  09/01/22 92.5 kg (204 lb)  08/06/22 93.4 kg (206 lb)    Physical Exam:   General:  NAD. No resp difficulty, walked into clinic, elderly and fatigued-appearing. HEENT: Normal Neck: Supple. No JVD. Carotids 2+ bilat; no bruits. No lymphadenopathy or thryomegaly appreciated. Cor: PMI nondisplaced. Regular rate & rhythm. No rubs, gallops or murmurs. Lungs: Rhonchi in bases. Abdomen: Soft, nontender, nondistended. No hepatosplenomegaly. No bruits or masses. Good bowel sounds. Extremities: No cyanosis, clubbing, rash, edema Neuro: Alert & oriented x 3, cranial nerves grossly intact. Moves all 4 extremities w/o difficulty. Affect pleasant.  Assessment & Plan: 1. Dyspnea: Likely multifactorial. Had + home COVID test 3 weeks ago, but follow up testing at [facility] 10/05/22 negative. He is not volume overloaded today. Lung sounds concerning for PNA/effusion. He is afebrile, and O2 sats 96% on room air today.  - Will get CXR. - Check CBC, BMET and BNP. - I  will start work up here but we discussed ED precautions. I will call them with results of CXR and next steps.  2. Syncope: 6/23, most likely cardiogenic. Known tachy-brady syndrome with  outpatient monitor showing post-conversion (AF => NSR) symptomatic pauses, up to 3 sec long. Has bifascicular block.  Echo (6/23) EF 40-45% and pre-existing aneurysm of the basal inferolateral wall, mild-mod AS. EP study in 6/23 w/ inducible VT. Biotronik ICD placed.  - No further events. 3. CAD: s/p CABG 3/12. NSTEMI 1/20, LHC showed new occlusion of the branch of SVG-ramus and OM that touched down on OM.  There were also serial 70% stenoses in the mid/distal RCA.  FFR of RCA was negative.  No interventional target.  Cardiolite in 12/21 showed inferolateral infarction with no ischemia, consistent with findings on NSTEMI in 1/20. Cardiolite in 3/23 showed inferior/inferolateral fixed defect with no ischemia.  Syncope in 6/23 was not associated with ACS. Rare atypical chest pain. Worsening dyspnea could be anginal equivalent. No acute ST-T changes on ECG today. Will get CXR as above, consider repeat echo and LHC.  - No ASA given Eliquis use.  - Continue atorvastatin 80 daily.   - Off ranolazine with prolonged QTc.  - Continue Imdur 15 mg daily (Recently reduced from 30 daily, consider stopping if no worsening of chest pain).  4. Hyperlipidemia: Continue statin. 5. Chronic HF with mid range EF: TEE in 7/20 showed EF 50-55%, basal inferolateral aneurysm, and normal RV.  Echo in 1/22 with EF 40-45%, basal-mid inferolateral akinesis and inferior hypokinesis, normal RV.  Echo in 6/23 showed EF 40-45% and pre-existing aneurysm of the basal inferolateral wall, mild-mod AS.  NYHA class III-IIIb symptoms.  He is not volume overloaded today on exam.  GDMT has been very limited by orthostatic symptoms. I suspect deconditioning and lung disease (recurrent left effusion and h/o PTX with likely scarring) play a role.  - Continue Toprol XL 25 mg daily.  - Continue Lasix 20 mg daily. BMET and BNP today. - Continue spironolactone 25 mg qhs. - Continue low dose midodrine for now with orthostatic symptoms.  - Off ARB due  to orthostatic hypotension. - Unable to tolerate dapaglifozin. - He needs to wear graded compression stockings during the day.  6. Spontaneous left PTX with recurrent left-sided pleural effusion:  He is s/p left VATS/pleurodesis in 7/20.    7. Carotid stenosis: S/p bilateral TCARs, followed at [facility].     8. AAA: 4.9 cm AAA in 10/23, followed by [facility].  S/p EVAR 12/23. 9. Aortic stenosis: mild-mod on echo 6/23. 10. Atrial fibrillation: Paroxysmal. He is significantly symptomatic while in atrial fibrillation. He is off amiodarone due to concern for pulmonary toxicity.  He saw EP, did not recommend ablation. He would likely be a Tikosyn or sotalol candidate but would have to come into the hospital for it.  As he has had minimal atrial fibrillation recently, hold off Tikosyn/sotalol for now. NSR today. Frequent alerts for AV w/ elevated VRs 12/23, he was asymptomatic with these. Unable to interrogate device in clinic today. Will reach out to device clinic. - Continue Eliquis 5 mg bid. No bleeding issues. CBC today. 11. Pseudoaneurysm: Chronic PSA basal inferolateral wall.  This has been stable.  12. VT: Recurrent.  Has Biotronik ICD.  He had VT again in 9/23, not associated with chest pain/ischemic symptoms.  - Continue Toprol XL 25 mg daily.  - Continue mexiletine 150 mg bid.  Follow up in 3-4 weeks with Dr. Aundra Dubin.  Eric Bo Donavon Kimrey FNP-BC 10/14/2022  Addendum: Mr. Amend endorses 6/10 chest pain at the conclusion of our visit. I advised evaluation in ED. He and wife agreeable.

## 2022-10-14 NOTE — Addendum Note (Signed)
Encounter addended by: Rafael Bihari, FNP on: 10/14/2022 9:12 AM  Actions taken: Clinical Note Signed

## 2022-10-14 NOTE — Progress Notes (Signed)
H/o CABG by Dr. Servando Snare 12/11/2010  Coronary artery bypass grafting x5 with a left internal mammary to the left anterior descending coronary artery, reverse saphenous vein graft to the diagonal coronary artery, sequential reverse saphenous vein graft to the obtuse marginal and distal circumflex and reverse saphenous vein graft to the distal right coronary artery with endoscopic vein harvesting.

## 2022-10-14 NOTE — Patient Instructions (Signed)
It was great to see you today! No medication changes are needed at this time.   Labs today We will only contact you if something comes back abnormal or we need to make some changes. Otherwise no news is good news!  A chest x-ray takes a picture of the organs and structures inside the chest, including the heart, lungs, and blood vessels. This test can show several things, including, whether the heart is enlarges; whether fluid is building up in the lungs; and whether pacemaker / defibrillator leads are still in place.   Your physician recommends that you schedule a follow-up appointment as soon as possible with Dr Aundra Dubin   Do the following things EVERYDAY: Weigh yourself in the morning before breakfast. Write it down and keep it in a log. Take your medicines as prescribed Eat low salt foods--Limit salt (sodium) to 2000 mg per day.  Stay as active as you can everyday Limit all fluids for the day to less than 2 liters  At the [facility], you and your health needs are our priority. As part of our continuing mission to provide you with exceptional heart care, we have created designated Provider Care Teams. These Care Teams include your primary Cardiologist (physician) and Advanced Practice Providers (APPs- Physician Assistants and Nurse Practitioners) who all work together to provide you with the care you need, when you need it.   You may see any of the following providers on your designated Care Team at your next follow up: Dr Glori Bickers Dr [name] Dr. Roxana Hires, NP Lyda Jester, [place] [facility] [place], [place] Forestine Na, NP Audry Riles, PharmD   Please be sure to bring in all your medications bottles to every appointment.   If you have any questions or concerns before your next appointment please send [place] a message through [place] or call our office at [phone].    TO LEAVE A MESSAGE FOR THE NURSE SELECT OPTION 2,  PLEASE LEAVE A MESSAGE INCLUDING: YOUR NAME DATE OF BIRTH CALL BACK NUMBER REASON FOR CALL**this is important as we prioritize the call backs  YOU WILL RECEIVE A CALL BACK THE SAME DAY AS LONG AS YOU CALL BEFORE 4:00 PM

## 2022-10-14 NOTE — Progress Notes (Signed)
ANTICOAGULATION CONSULT NOTE - Initial Consult  Pharmacy Consult for Heparin Indication: atrial fibrillation  Allergies  Allergen Reactions   Ancef [Cefazolin Sodium] Hives   Empagliflozin Other (See Comments)    Dizziness   Vancomycin Rash    Patient Measurements: Height: '6\' 3"'$  (190.5 cm) Weight: 92.1 kg (203 lb) IBW/kg (Calculated) : 84.5 Heparin Dosing Weight: 92.1 kg  Vital Signs: Temp: 97.6 F (36.4 C) (01/23 1404) Temp Source: Oral (01/23 1404) BP: 126/69 (01/23 1404) Pulse Rate: 66 (01/23 1404)  Labs: Recent Labs    10/14/22 0857 10/14/22 1017  HGB  --  14.7  HCT  --  45.0  PLT  --  152  CREATININE  --  1.47*  TROPONINIHS 9 9    Estimated Creatinine Clearance: 40.7 mL/min (A) (by C-G formula based on SCr of 1.47 mg/dL (H)).   Medical History: Past Medical History:  Diagnosis Date   Abdominal aortic aneurysm (AAA) (HCC)    Achilles tendinitis of right lower extremity    AICD (automatic cardioverter/defibrillator) present    Arthritis    "my whole body" (03/19/2018)   Atherosclerosis of coronary artery bypass graft w/o angina pectoris    Atrial fibrillation (HCC)    Barrett's esophagus    CAD (coronary artery disease)    Carotid artery disease (HCC)    CHF (congestive heart failure) (HCC)    Chronic anticoagulation    PE   Chronic lower back pain    Dyspnea    Dysrhythmia    Essential tremor    GERD (gastroesophageal reflux disease)    High cholesterol    Hyperlipidemia    Hyperplasia, prostate    Hypertension    Hypothyroidism    Myocardial infarction ([place])    2 in Jan. 2019   Nail dystrophy    OSA on CPAP    uses CPAP   Osteoarthrosis    Pneumonia    "now and once before" (03/19/2018)   Pulmonary embolism [facility])    April 2012 after CABG   S/P CABG (coronary artery bypass graft)    Sleep apnea    Spontaneous pneumothorax    left spontaneous pneumothorax/left pleural effusion, s/p chest tube 01/09/19; thoracentesis x2, s/p left  VATS/tacl pleurodesis 04/12/19    Medications:  (Not in a hospital admission)  Scheduled:   sodium chloride flush  3 mL Intravenous Q12H   [START ON 10/16/2022] spironolactone  25 mg Oral Daily   Infusions:  PRN:   Assessment: 35 yom with a history of CAD s/p CABG 2012, PAF on Eliquis, pneumothorax 4/20 w/ recurrent left pleural effusion s/p VATS 7/20, s/p bilateral TCAR, AAA s/p EVAR 07/2022, orthostatic hypotension on midodrine, history of VT on ICD. Patient is presenting with chest pain. Heparin per pharmacy consult placed for atrial fibrillation.  Patient is on apixaban prior to arrival. Last dose 1/23 ~6am. Will require aPTT monitoring due to likely falsely high anti-Xa level secondary to DOAC use.  Hgb 14.7; plt 152 Estimated Creatinine Clearance: 40.7 mL/min (A) (by C-G formula based on SCr of 1.47 mg/dL (H)).  Goal of Therapy:  Heparin level 0.3-0.7 units/ml aPTT 66-102 seconds Monitor platelets by anticoagulation protocol: Yes   Plan:  Baseline coags ordered No initial heparin bolus Start heparin infusion at 1450 units/hr at 1830 today Check next aPTT & anti-Xa level at 0300 and monitor daily while on heparin Continue to monitor via aPTT until levels are correlated Continue to monitor H&H and platelets  Lorelei Pont, PharmD, BCPS 10/14/2022 4:31 PM  ED Clinical Pharmacist -  [phone]

## 2022-10-15 ENCOUNTER — Encounter (HOSPITAL_COMMUNITY)
Admission: EM | Disposition: A | Discharge status: Home / Self Care | Payer: Self-pay | Source: Home / Self Care | Attending: Emergency Medicine

## 2022-10-15 DIAGNOSIS — I25708 Atherosclerosis of coronary artery bypass graft(s), unspecified, with other forms of angina pectoris: Secondary | ICD-10-CM | POA: Diagnosis not present

## 2022-10-15 DIAGNOSIS — I25118 Atherosclerotic heart disease of native coronary artery with other forms of angina pectoris: Secondary | ICD-10-CM | POA: Diagnosis not present

## 2022-10-15 DIAGNOSIS — I5022 Chronic systolic (congestive) heart failure: Secondary | ICD-10-CM | POA: Diagnosis not present

## 2022-10-15 DIAGNOSIS — I4819 Other persistent atrial fibrillation: Secondary | ICD-10-CM | POA: Diagnosis not present

## 2022-10-15 DIAGNOSIS — R072 Precordial pain: Secondary | ICD-10-CM | POA: Diagnosis not present

## 2022-10-15 HISTORY — PX: RIGHT/LEFT HEART CATH AND CORONARY/GRAFT ANGIOGRAPHY: CATH118267

## 2022-10-15 LAB — POCT I-STAT EG7
Acid-Base Excess: 4 mmol/L — ABNORMAL HIGH (ref 0.0–2.0)
Bicarbonate: 30.1 mmol/L — ABNORMAL HIGH (ref 20.0–28.0)
Calcium, Ion: 1.32 mmol/L (ref 1.15–1.40)
HCT: 44 % (ref 39.0–52.0)
Hemoglobin: 15 g/dL (ref 13.0–17.0)
O2 Saturation: 69 %
Potassium: 4.4 mmol/L (ref 3.5–5.1)
Sodium: 141 mmol/L (ref 135–145)
TCO2: 32 mmol/L (ref 22–32)
pCO2, Ven: 52.1 mmHg (ref 44–60)
pH, Ven: 7.371 (ref 7.25–7.43)
pO2, Ven: 38 mmHg (ref 32–45)

## 2022-10-15 LAB — BASIC METABOLIC PANEL
Anion gap: 10 (ref 5–15)
Anion gap: 10 (ref 5–15)
BUN: 41 mg/dL — ABNORMAL HIGH (ref 8–23)
BUN: 38 mg/dL — ABNORMAL HIGH (ref 8–23)
CO2: 29 mmol/L (ref 22–32)
CO2: 31 mmol/L (ref 22–32)
Calcium: 9.5 mg/dL (ref 8.9–10.3)
Calcium: 9.4 mg/dL (ref 8.9–10.3)
Chloride: 100 mmol/L (ref 98–111)
Chloride: 101 mmol/L (ref 98–111)
Creatinine, Ser: 1.59 mg/dL — ABNORMAL HIGH (ref 0.61–1.24)
Creatinine, Ser: 1.49 mg/dL — ABNORMAL HIGH (ref 0.61–1.24)
GFR, Estimated: 41 mL/min — ABNORMAL LOW (ref >60)
GFR, Estimated: 45 mL/min — ABNORMAL LOW (ref >60)
Glucose, Bld: 104 mg/dL — ABNORMAL HIGH (ref 70–99)
Glucose, Bld: 97 mg/dL (ref 70–99)
Potassium: 4.2 mmol/L (ref 3.5–5.1)
Potassium: 4.4 mmol/L (ref 3.5–5.1)
Sodium: 139 mmol/L (ref 135–145)
Sodium: 142 mmol/L (ref 135–145)

## 2022-10-15 LAB — POCT I-STAT 7, (LYTES, BLD GAS, ICA,H+H)
Acid-Base Excess: 3 mmol/L — ABNORMAL HIGH (ref 0.0–2.0)
Bicarbonate: 29 mmol/L — ABNORMAL HIGH (ref 20.0–28.0)
Calcium, Ion: 1.31 mmol/L (ref 1.15–1.40)
HCT: 42 % (ref 39.0–52.0)
Hemoglobin: 14.3 g/dL (ref 13.0–17.0)
O2 Saturation: 93 %
Potassium: 4.3 mmol/L (ref 3.5–5.1)
Sodium: 139 mmol/L (ref 135–145)
TCO2: 31 mmol/L (ref 22–32)
pCO2 arterial: 49.6 mmHg — ABNORMAL HIGH (ref 32–48)
pH, Arterial: 7.375 (ref 7.35–7.45)
pO2, Arterial: 69 mmHg — ABNORMAL LOW (ref 83–108)

## 2022-10-15 LAB — POCT ACTIVATED CLOTTING TIME
Activated Clotting Time: 201 seconds
Activated Clotting Time: 185 seconds

## 2022-10-15 LAB — APTT
aPTT: 90 seconds — ABNORMAL HIGH (ref 24–36)
aPTT: 92 seconds — ABNORMAL HIGH (ref 24–36)
aPTT: 133 seconds — ABNORMAL HIGH (ref 24–36)

## 2022-10-15 LAB — HEPARIN LEVEL (UNFRACTIONATED)
Heparin Unfractionated: >1.1 IU/mL — ABNORMAL HIGH (ref 0.30–0.70)
Heparin Unfractionated: >1.1 IU/mL — ABNORMAL HIGH (ref 0.30–0.70)

## 2022-10-15 SURGERY — RIGHT/LEFT HEART CATH AND CORONARY/GRAFT ANGIOGRAPHY
Anesthesia: LOCAL

## 2022-10-15 MED ORDER — ACETAMINOPHEN 325 MG PO TABS: 650.0000 mg | ORAL_TABLET | ORAL | Status: DC | PRN

## 2022-10-15 MED ORDER — MIDODRINE HCL 5 MG PO TABS
2.5000 mg | ORAL_TABLET | Freq: Two times a day (BID) | ORAL | Status: DC
  Administered 2022-10-15 – 2022-10-16 (×2): 2.5 mg via ORAL
  Filled 2022-10-15 (×2): qty 1

## 2022-10-15 MED ORDER — HEPARIN SODIUM (PORCINE) 1000 UNIT/ML IJ SOLN
INTRAMUSCULAR | Status: DC | PRN
  Administered 2022-10-15: 4500 [IU] via INTRAVENOUS

## 2022-10-15 MED ORDER — SODIUM CHLORIDE 0.9 % IV SOLN: INTRAVENOUS | Status: DC

## 2022-10-15 MED ORDER — IOHEXOL 350 MG/ML SOLN
INTRAVENOUS | Status: DC | PRN
  Administered 2022-10-15: 60 mL via INTRA_ARTERIAL

## 2022-10-15 MED ORDER — LIDOCAINE HCL (PF) 1 % IJ SOLN
INTRAMUSCULAR | Status: AC
  Filled 2022-10-15: qty 30

## 2022-10-15 MED ORDER — HEPARIN SODIUM (PORCINE) 1000 UNIT/ML IJ SOLN
INTRAMUSCULAR | Status: AC
  Filled 2022-10-15: qty 10

## 2022-10-15 MED ORDER — MIDAZOLAM HCL 2 MG/2ML IJ SOLN
INTRAMUSCULAR | Status: AC
  Filled 2022-10-15: qty 2

## 2022-10-15 MED ORDER — SODIUM CHLORIDE 0.9 % IV SOLN: INTRAVENOUS | Status: AC

## 2022-10-15 MED ORDER — SODIUM CHLORIDE 0.9% FLUSH: 3.0000 mL | INTRAVENOUS | Status: DC | PRN

## 2022-10-15 MED ORDER — LIDOCAINE HCL (PF) 1 % IJ SOLN
INTRAMUSCULAR | Status: DC | PRN
  Administered 2022-10-15 (×2): 2 mL

## 2022-10-15 MED ORDER — ASPIRIN 81 MG PO TBEC
81.0000 mg | DELAYED_RELEASE_TABLET | Freq: Every day | ORAL | Status: DC
  Administered 2022-10-16: 81 mg via ORAL
  Filled 2022-10-15: qty 1

## 2022-10-15 MED ORDER — ONDANSETRON HCL 4 MG/2ML IJ SOLN: 4.0000 mg | Freq: Four times a day (QID) | INTRAMUSCULAR | Status: DC | PRN

## 2022-10-15 MED ORDER — LABETALOL HCL 5 MG/ML IV SOLN: 10.0000 mg | INTRAVENOUS | Status: AC | PRN

## 2022-10-15 MED ORDER — MELATONIN 5 MG PO TABS
5.0000 mg | ORAL_TABLET | Freq: Every day | ORAL | Status: DC
  Administered 2022-10-15 (×2): 5 mg via ORAL
  Filled 2022-10-15 (×2): qty 1

## 2022-10-15 MED ORDER — PANTOPRAZOLE SODIUM 40 MG PO TBEC
40.0000 mg | DELAYED_RELEASE_TABLET | Freq: Every day | ORAL | Status: DC
  Administered 2022-10-16: 40 mg via ORAL
  Filled 2022-10-15: qty 1

## 2022-10-15 MED ORDER — VERAPAMIL HCL 2.5 MG/ML IV SOLN
INTRAVENOUS | Status: DC | PRN
  Administered 2022-10-15: 10 mL via INTRA_ARTERIAL

## 2022-10-15 MED ORDER — LIDOCAINE HCL (PF) 1 % IJ SOLN
INTRAMUSCULAR | Status: DC | PRN
  Administered 2022-10-15: 10 mL

## 2022-10-15 MED ORDER — METOPROLOL SUCCINATE ER 25 MG PO TB24
25.0000 mg | ORAL_TABLET | Freq: Every day | ORAL | Status: DC
  Administered 2022-10-16: 25 mg via ORAL
  Filled 2022-10-15: qty 1

## 2022-10-15 MED ORDER — ISOSORBIDE MONONITRATE ER 30 MG PO TB24
15.0000 mg | ORAL_TABLET | Freq: Every day | ORAL | Status: DC
  Administered 2022-10-16: 15 mg via ORAL
  Filled 2022-10-15: qty 1

## 2022-10-15 MED ORDER — HEPARIN (PORCINE) IN NACL 1000-0.9 UT/500ML-% IV SOLN
INTRAVENOUS | Status: DC | PRN
  Administered 2022-10-15 (×2): 500 mL

## 2022-10-15 MED ORDER — FENTANYL CITRATE (PF) 100 MCG/2ML IJ SOLN
INTRAMUSCULAR | Status: AC
  Filled 2022-10-15: qty 2

## 2022-10-15 MED ORDER — MEXILETINE HCL 150 MG PO CAPS
150.0000 mg | ORAL_CAPSULE | Freq: Two times a day (BID) | ORAL | Status: DC
  Administered 2022-10-15 – 2022-10-16 (×2): 150 mg via ORAL
  Filled 2022-10-15 (×2): qty 1

## 2022-10-15 MED ORDER — VERAPAMIL HCL 2.5 MG/ML IV SOLN
INTRAVENOUS | Status: AC
  Filled 2022-10-15: qty 2

## 2022-10-15 MED ORDER — LEVOTHYROXINE SODIUM 25 MCG PO TABS
50.0000 ug | ORAL_TABLET | Freq: Every day | ORAL | Status: DC
  Administered 2022-10-16: 50 ug via ORAL
  Filled 2022-10-15: qty 2

## 2022-10-15 MED ORDER — SODIUM CHLORIDE 0.9% FLUSH: 3.0000 mL | Freq: Two times a day (BID) | INTRAVENOUS | Status: DC

## 2022-10-15 MED ORDER — HYDRALAZINE HCL 20 MG/ML IJ SOLN: 10.0000 mg | INTRAMUSCULAR | Status: AC | PRN

## 2022-10-15 MED ORDER — MIDAZOLAM HCL 2 MG/2ML IJ SOLN
INTRAMUSCULAR | Status: DC | PRN
  Administered 2022-10-15: 1 mg via INTRAVENOUS

## 2022-10-15 MED ORDER — SODIUM CHLORIDE 0.9 % IV SOLN: 250.0000 mL | INTRAVENOUS | Status: DC | PRN

## 2022-10-15 MED ORDER — ATORVASTATIN CALCIUM 40 MG PO TABS
80.0000 mg | ORAL_TABLET | Freq: Every day | ORAL | Status: DC
  Administered 2022-10-16: 80 mg via ORAL
  Filled 2022-10-15: qty 2

## 2022-10-15 MED ORDER — FENTANYL CITRATE (PF) 100 MCG/2ML IJ SOLN
INTRAMUSCULAR | Status: DC | PRN
  Administered 2022-10-15: 25 ug via INTRAVENOUS

## 2022-10-15 MED ORDER — HEPARIN (PORCINE) IN NACL 1000-0.9 UT/500ML-% IV SOLN
INTRAVENOUS | Status: AC
  Filled 2022-10-15: qty 1000

## 2022-10-15 MED ORDER — NITROGLYCERIN 0.4 MG SL SUBL: 0.4000 mg | SUBLINGUAL_TABLET | SUBLINGUAL | Status: DC | PRN

## 2022-10-15 SURGICAL SUPPLY — 15 items
CATH INFINITI 5FR MULTPACK ANG (CATHETERS) IMPLANT
CATH SWAN GANZ 7F STRAIGHT (CATHETERS) IMPLANT
DEVICE RAD COMP TR BAND LRG (VASCULAR PRODUCTS) IMPLANT
ELECT DEFIB PAD ADLT CADENCE (PAD) IMPLANT
GLIDESHEATH SLEND SS 6F .021 (SHEATH) IMPLANT
GLIDESHEATH SLENDER 7FR .021G (SHEATH) IMPLANT
GUIDEWIRE .025 260CM (WIRE) IMPLANT
GUIDEWIRE INQWIRE 1.5J.035X260 (WIRE) IMPLANT
INQWIRE 1.5J .035X260CM (WIRE) ×1
KIT HEART LEFT (KITS) ×2 IMPLANT
PACK CARDIAC CATHETERIZATION (CUSTOM PROCEDURE TRAY) ×2 IMPLANT
SHEATH PINNACLE 7F 10CM (SHEATH) IMPLANT
SHEATH PROBE COVER 6X72 (BAG) IMPLANT
TRANSDUCER W/STOPCOCK (MISCELLANEOUS) ×2 IMPLANT
TUBING CIL FLEX 10 FLL-RA (TUBING) ×2 IMPLANT

## 2022-10-15 NOTE — Progress Notes (Signed)
ANTICOAGULATION CONSULT NOTE - Follow Up Consult  Pharmacy Consult for heparin Indication:  Afib/USAP  Labs: Recent Labs    10/14/22 0857 10/14/22 1017 10/14/22 1845 10/15/22 0246 10/15/22 0345  HGB  --  14.7  --   --   --   HCT  --  45.0  --   --   --   PLT  --  152  --   --   --   APTT  --   --  30 90* 92*  HEPARINUNFRC  --   --  >1.10* >1.10*  --   CREATININE  --  1.47*  --   --   --   TROPONINIHS 9 9  --   --   --     Assessment/Plan:  89yo male therapeutic on heparin with initial dosing while Eliquis on hold. Will continue infusion at current rate of 1450 units/hr and confirm stable with additional PTT.   Wynona Neat, PharmD, BCPS  10/15/2022,4:33 AM

## 2022-10-15 NOTE — Interval H&P Note (Signed)
History and Physical Interval Note:  10/15/2022 10:34 AM  Eric Gordon  has presented today for surgery, with the diagnosis of chest pain.  The various methods of treatment have been discussed with the patient and family. After consideration of risks, benefits and other options for treatment, the patient has consented to  Procedure(s): RIGHT/LEFT HEART CATH AND CORONARY/GRAFT ANGIOGRAPHY (N/A) as a surgical intervention.  The patient's history has been reviewed, patient examined, no change in status, stable for surgery.  I have reviewed the patient's chart and labs.  Questions were answered to the patient's satisfaction.    Cath Lab Visit (complete for each Cath Lab visit)  Clinical Evaluation Leading to the Procedure:   ACS: No.  Non-ACS:    Anginal Classification: CCS III  Anti-ischemic medical therapy: Maximal Therapy (2 or more classes of medications)  Non-Invasive Test Results: No non-invasive testing performed  Prior CABG: Previous CABG        Lauree Chandler

## 2022-10-15 NOTE — Progress Notes (Signed)
TR BAND REMOVAL  LOCATION:    Left radial  DEFLATED PER PROTOCOL:    Yes.    TIME BAND OFF / DRESSING APPLIED:    1630pm a clean dry dressing with tegaderm and gauze, secured with coban.  SITE UPON ARRIVAL:    Level 0  SITE AFTER BAND REMOVAL:    Level 0  CIRCULATION SENSATION AND MOVEMENT:    Within Normal Limits   Yes.    COMMENTS:   Care instructions given to patient.

## 2022-10-15 NOTE — Progress Notes (Signed)
Site area: Right groin a 7 french venous sheath was removed  Site Prior to Removal:  Level 0   Pressure Applied For 15 MINUTES    Bedrest Beginning at  1345p X 2 hours  Manual:   Yes.    Patient Status During Pull:  stable  Post Pull Groin Site:  Level 0  Post Pull Instructions Given:  Yes.    Post Pull Pulses Present:  Yes  Dressing Applied:  Yes.    Comments:

## 2022-10-15 NOTE — ED Notes (Signed)
Pt in room A&O x4. Denies any CP or SOB at this time. Updated on plan of care. Pt changed into a gown and room adjusted for comfort.

## 2022-10-15 NOTE — H&P (View-Only) (Signed)
Rounding Note    Patient Name: Eric Gordon Date of Encounter: 10/15/2022  [facility] Cardiologist: None   Subjective   He had some chest pain overnight but chest pain free this morning. No shortness of breath at rest. No lightheadedness or dizziness. Plan is for [facility] later today.  Inpatient Medications    Scheduled Meds:  [MAR Hold] aspirin EC  81 mg Oral Daily   [MAR Hold] atorvastatin  80 mg Oral Daily   [MAR Hold] isosorbide mononitrate  15 mg Oral Daily   [MAR Hold] levothyroxine  50 mcg Oral Q0600   [MAR Hold] melatonin  5 mg Oral QHS   [MAR Hold] metoprolol succinate  25 mg Oral Daily   [MAR Hold] mexiletine  150 mg Oral BID   [MAR Hold] midodrine  2.5 mg Oral BID WC   [MAR Hold] pantoprazole  40 mg Oral QAC breakfast   [MAR Hold] sodium chloride flush  3 mL Intravenous Q12H   Continuous Infusions:  sodium chloride     sodium chloride 50 mL/hr at 10/15/22 0800   heparin Stopped (10/15/22 1026)   PRN Meds:    Vital Signs    Vitals:   10/15/22 0500 10/15/22 0545 10/15/22 0754 10/15/22 0945  BP: 121/66 (!) 115/43 104/75 (!) 120/55  Pulse: 64 62 77 62  Resp: 18 18 (!) 22 20  Temp:   97.8 F (36.6 C)   TempSrc:      SpO2: 97% 94% 94% 93%  Weight:      Height:        Intake/Output Summary (Last 24 hours) at 10/15/2022 1026 Last data filed at 10/14/2022 1803 Gross per 24 hour  Intake --  Output 1175 ml  Net -1175 ml      10/14/2022    9:30 AM 10/14/2022    8:32 AM 09/01/2022   12:02 PM  Last 3 Weights  Weight (lbs) 203 lb 203 lb 204 lb  Weight (kg) 92.08 kg 92.08 kg 92.534 kg      Telemetry    A paced rhythm. - Personally Reviewed  ECG    No new ECG tracing today. - Personally Reviewed  Physical Exam   GEN: No acute distress.   Neck: No JVD. Cardiac: RRR. Possible very soft systolic murmur noted along left lower sternal border. Radial pulses 2+ and equal bilaterally. Respiratory: Clear to auscultation bilaterally. No  wheezes, rhonchi, or rales. GI: Soft, non-distended, and non-tender. MS: No lower extremity edema. No deformity. Skin: Warm and dry. Neuro:  No focal deficits. Psych: Normal affect.   Labs    High Sensitivity Troponin:   Recent Labs  Lab 10/14/22 0857 10/14/22 1017  TROPONINIHS 9 9     Chemistry Recent Labs  Lab 10/14/22 1017 10/15/22 0345 10/15/22 0900  NA 136 139 142  K 4.2 4.2 4.4  CL 101 100 101  CO2 '24 29 31  '$ GLUCOSE 103* 104* 97  BUN 32* 41* 38*  CREATININE 1.47* 1.59* 1.49*  CALCIUM 9.6 9.5 9.4  PROT 7.3  --   --   ALBUMIN 3.8  --   --   AST 24  --   --   ALT 17  --   --   ALKPHOS 99  --   --   BILITOT 0.7  --   --   GFRNONAA 45* 41* 45*  ANIONGAP '11 10 10    '$ Lipids No results for input(s): "CHOL", "TRIG", "HDL", "LABVLDL", "LDLCALC", "CHOLHDL" in the last  168 hours.  Hematology Recent Labs  Lab 10/14/22 1017  WBC 8.9  RBC 4.65  HGB 14.7  HCT 45.0  MCV 96.8  MCH 31.6  MCHC 32.7  RDW 14.2  PLT 152   Thyroid No results for input(s): "TSH", "FREET4" in the last 168 hours.  BNP Recent Labs  Lab 10/14/22 1017  BNP 202.0*    DDimer No results for input(s): "DDIMER" in the last 168 hours.   Radiology    CT Angio Chest/Abd/Pel for Dissection W and/or Wo Contrast  Result Date: 10/14/2022 CLINICAL DATA:  Acute aortic syndrome (AAS) suspected evaluate pleural fluid as well as aortic aneurysm repair EXAM: CT ANGIOGRAPHY CHEST, ABDOMEN AND PELVIS TECHNIQUE: Non-contrast CT of the chest was initially obtained. Multidetector CT imaging through the chest, abdomen and pelvis was performed using the standard protocol during bolus administration of intravenous contrast. Multiplanar reconstructed images and MIPs were obtained and reviewed to evaluate the vascular anatomy. RADIATION DOSE REDUCTION: This exam was performed according to the departmental dose-optimization program which includes automated exposure control, adjustment of the mA and/or kV according to  patient size and/or use of iterative reconstruction technique. CONTRAST:  74m OMNIPAQUE IOHEXOL 350 MG/ML SOLN COMPARISON:  08/28/2022 and previous FINDINGS: CTA CHEST FINDINGS Cardiovascular: Stable left subclavian AICD. SVC patent. Heart size normal. No pericardial effusion. The RV is nondilated. Incomplete opacification of the pulmonary arterial tree, with no convincing filling defects to suggest acute PE. Mitral annulus calcifications. 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 Scattered coronary calcifications, post CABG. Good contrast opacification of the thoracic aorta, no with no evidence of dissection or stenosis. Moderate calcified atheromatous plaque particularly in the descending segment. Stable 1.6 cm penetrating atheromatous ulcer in the descending thoracic aorta at the level of the left mainstem bronchus. No intramural hematoma. Aortic Root: --Valve: 3 cm --Sinuses: 3.6 cm --Sinotubular Junction: 3.2 cm Limitations by motion: Mild Thoracic Aorta: --Ascending Aorta: 4 cm --Aortic Arch: 3.9 cm --Descending Aorta: 4 cm Mediastinum/Nodes: No mass or adenopathy.  Small hiatal hernia. Lungs/Pleura: Chronic small left pleural effusion. No pneumothorax. Stable subsegmental atelectasis/scarring in the lung bases left greater than right. Musculoskeletal: Sternotomy wires. Anterior vertebral endplate spurring at multiple levels in the mid and lower thoracic spine. Review of the MIP images confirms the above findings. CTA ABDOMEN AND PELVIS FINDINGS VASCULAR Aorta: Patent bifurcated infrarenal stent graft. Persistent type 2 endoleak at the level of the IMA origin. 5 x 4.8 cm native sac diameter (previously 5.1 x 4.7 by my measurement). No evidence of leak or impending rupture. Celiac: Patent without evidence of aneurysm, dissection, vasculitis or significant stenosis. SMA: Patent without evidence of aneurysm, dissection, vasculitis or significant stenosis. Renals:  Single right, with calcified ostial plaque resulting in short segment mild stenosis, patent distally. Single left, with mild short-segment proximal stenosis, atheromatous but patent distally. IMA: Continued patency, probably with retrograde flow contributing to the endoleak. Inflow: Stent graft limbs extend to distal common iliac arteries, well apposed. Native iliac arterial systems have scattered calcified plaque, no dissection, aneurysm, or stenosis. Veins: No obvious venous abnormality within the limitations of this arterial phase study. Review of the MIP images confirms the above findings. NON-VASCULAR Hepatobiliary: No focal liver abnormality is seen. No gallstones, gallbladder wall thickening, or biliary dilatation. Pancreas: Unremarkable. No pancreatic ductal dilatation or surrounding inflammatory changes. Spleen: Normal in size without focal abnormality. Adrenals/Urinary Tract: No adrenal mass. Stable 5.8 cm 9 HU left upper pole renal cyst, and  4.1 cm 24 HU right upper pole renal cyst; no follow-up required. No hydronephrosis or solid renal lesion. Urinary bladder physiologically distended. Stomach/Bowel: Small hiatal hernia. Stomach incompletely distended, unremarkable. 1.7 cm lipoma intraluminal in the mid duodenum stable. Small bowel decompressed. Normal appendix. Colon is incompletely distended by gas and fecal material, with scattered descending and sigmoid diverticula; no adjacent inflammatory change. Lymphatic: No abdominal or pelvic adenopathy. Reproductive: Prostate enlargement. Penile pump components partially visualized. Other: No ascites.  No free air. Musculoskeletal: Multilevel lumbar spondylitic change. No fracture or worrisome bone lesion. Review of the MIP images confirms the above findings. IMPRESSION: 1. Negative for acute PE or thoracic aortic dissection. 2. Chronic small left pleural effusion. 3. 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 4. Stable 1.6 cm penetrating atheromatous ulcer in the  descending thoracic aorta. 5. Patent infrarenal stent graft with persistent type 2 endoleak, stable 5 cm native sac diameter. 6. Small hiatal hernia. 7. Descending and sigmoid diverticulosis. Electronically Signed   By: Lucrezia Europe M.D.   On: 10/14/2022 16:19   DG Chest 2 View  Result Date: 10/14/2022 CLINICAL DATA:  Cough and shortness of breath. EXAM: CHEST - 2 VIEW COMPARISON:  Chest two views 02/26/2022, CT chest 07/24/2022, CTA abdomen and pelvis 08/28/2022, CT chest 02/22/2022 FINDINGS: Left chest wall cardiac AICD with leads overlying the right atrium and right ventricle, similar to prior. Status post median sternotomy and CABG. Cardiac silhouette and mediastinal contours are within normal limits with moderate calcification again seen within the aortic arch. The right lung appears clear. Note is made of small left pleural effusion and basilar atelectasis within the left lower lung on 07/24/2022 and 08/28/2022 CTs, not as well seen on the most recent 02/26/2022 chest radiograph. There is similar left retrocardiac and costophrenic angle opacification as on 02/26/2022 prior frontal chest radiograph, and there may be minimal left basilar atelectasis and pleural fluid remaining. The right lung is clear. No pneumothorax. Moderate multilevel degenerative disc changes of the thoracic spine. IMPRESSION: The prior small left pleural effusion and left basilar atelectasis seen on 02/22/2022, 07/24/2022, and 08/28/2022 CTs was not well visualized on prior 02/26/2022 radiographs. The appearance is similar on the current study, and there is again mild left retrocardiac and costophrenic angle opacification. This likely represents mild chronic left basilar atelectasis and pleural fluid. Electronically Signed   By: Yvonne Kendall M.D.   On: 10/14/2022 11:01    Cardiac Studies   Myoview 11/21/2021:   Findings are consistent with prior myocardial infarction. The study is high risk.   There were ST depressions in the  anterior leads and frequent brief runs of NSVT as well as isolated PVCs during stress.   LV perfusion is abnormal. There is a medium sized, severe fixed defect in the basal-to-apical inferior and inferolateral LV walls. There is a small fixed defect in the basal-to-mid anterolateral LV wall. There is abnormal wall motion in the defect area. Consistent with infarction.   Left ventricular function is abnormal. Nuclear stress EF: 37 %. The left ventricular ejection fraction is moderately decreased (30-44%). End diastolic cavity size is moderately enlarged.   Prior study available for comparison from 08/28/2020. Compared to prior study, the inferior and inferolateral defect is now seen in the apical segments as well as the basal-to-mid segments. There is also a basal-to-mid anterolateral fixed defect.  _______________  Limited Echocardiogram 02/23/2022: Impressions: 1. There is a small aneurysm of the basal inferolateral wall. Thrombus is  not seen. Left ventricular ejection fraction, by estimation,  is 40 to 45%.  The left ventricle has mildly decreased function. The left ventricle  demonstrates regional wall motion  abnormalities (see scoring diagram/findings for description). There is  mild concentric left ventricular hypertrophy. Left ventricular diastolic  function could not be evaluated.   2. Right ventricular systolic function is normal. The right ventricular  size is normal. There is normal pulmonary artery systolic pressure. The  estimated right ventricular systolic pressure is 37.6 mmHg.   3. 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 A small  partly mobile calcified component may be present. The mitral valve is  degenerative. No evidence of mitral stenosis. Moderate to severe mitral  annular calcification.   4. Tricuspid valve regurgitation is mild to moderate.   5. The aortic valve is tricuspid. There is moderate calcification of the  aortic valve. There is moderate  thickening of the aortic valve. Aortic  valve regurgitation is not visualized. Mild to moderate aortic valve  stenosis.   6. There is borderline dilatation of the ascending aorta, measuring 39  mm.   7. The inferior vena cava is normal in size with greater than 50%  respiratory variability, suggesting right atrial pressure of 3 mmHg.   Comparison(s): No significant change from prior study. Prior images  reviewed side by side.    Patient Profile     89 y.o. male with a history of CAD s/p CABG x5 in 11/2010 with subsequent NSTEMI in 09/2018 which was treated medically, ischemic cardiomyopathy/ chronic HFrEF with EF of 40-45% on last Echo in 02/2022, VT and syncope s/p dual chamber Biotronik ICD in 02/2022, paroxysmal atrial fibrillation on Eliquis,  chronic incompetence, spontaneous pneumothorax in 12/2018 with subsequent recurrent left pleural effusion s/p VATS in 03/2019, bilateral carotid diease s/p right TCAR in 08/2020 and left TCAR in 01/2021, AAA s/p EVAR in 07/2022, orthostatic hypotension on Midodrine, hyperlipidemia, prior PE after CABG, obstructive sleep apnea on CPAP who was admitted on 10/14/2022 for further evaluation of chest pain.  Assessment & Plan    Chest Pain Dyspnea on Exertion CAD History of CABG x5 in 2012. Last LHC in 09/2018 showed occluded SVT to Diag and occluded SVG to PDA but patent LIMA to LAD and patent SVG to OM but occluded sequential limb to distal LCX. Medical therapy was recommended at that time. Myoview in 11/2021 showed evidence of prior MI. He now presents worsening chest pain, dyspnea on exertion, and fatigue over the last 3 weeks. He has some typical and atypical symptoms. High-sensitivity troponin negative x2. EKG showed an atrial paced rhythm.  - He had some chest pain overnight but chest pain free now. - Continue Imdur '15mg'$  daily and Toprol-XL '25mg'$  daily. - Not on aspirin at home due to Eliquis.  - Continue Lipitor '80mg'$  daily. - Plans is for right/left  cardiac catheterization today. Consented yesterday.  Ischemic Cardiomyopathy Chronic HFrEF Last Echo in 02/2022 showed LVEF of 40-45% with akinesis of mid inferolateral segment and basal anterolateral segment as well as aneurysmal basal inferolateral segment. BNP 202 which is lower than it has been in the past. Chest x-ray showed small left pleural effusion similar to prior studies. Chest CTA negative for PE or dissection. Received one dose of IV Lasix '20mg'$  in the ED with good urine output. - Euvolemic on exam.  -  Hold diuretics until after cath. - GDMT has been limited in the past due to orthostatic hypotension requiring Midodrine.  Persistent Atrial Fibrillation Last device check in 08/2022 showed 97.5%  atrial fibrillation burden. - Currently a-paced. - Continue Toprol-XL '25mg'$  daily. - On chronic anticoagulation with Eliquis. Currently on hold in anticipation of cardiac catheterization.  VT s/p ICD History of VT s/p ICD in 02/2022. - No recent issues. - Continue Toprol-XL '25mg'$  daily. - Continue Mexiletine '150mg'$  twice daily.  Orthostatic Hypotension BP well controlled.  - Continue Toprol-XL and Imdur as above.  - Continue home Midrodine 2.'5mg'$  twice daily.  CKD Stage III Creatinine 1.47 on admission. Baseline around 1.2 to 1.5. - Creatinine 1.59 this morning.  - Fluids increased to 50cc/hr prior to to cath.  Hypothyroidism - Continue home Synthroid.  For questions or updates, please contact [place] Please consult [email or web address] for contact info under        Signed, Darreld Mclean, PA-C  10/15/2022, 10:26 AM   As above, patient seen and examined.  Patient denies dyspnea this morning though predominant symptoms previously were with exertion.  He has "a little" chest pain.  As outlined previously symptoms have both typical and atypical features but they are progressive.  Will plan to proceed with right and left cardiac catheterization.  Note creatinine mildly  increased.  We have hydrated prior to procedure.  Limit dye.  No ventriculogram.  Follow renal function closely after procedure.  Resume apixaban once all procedures complete.  Will need to resume diuretics prior to discharge. Kirk Ruths, MD

## 2022-10-15 NOTE — Progress Notes (Addendum)
Rounding Note    Patient Name: Eric Gordon Date of Encounter: 10/15/2022  [facility] Cardiologist: None   Subjective   He had some chest pain overnight but chest pain free this morning. No shortness of breath at rest. No lightheadedness or dizziness. Plan is for [facility] later today.  Inpatient Medications    Scheduled Meds:  [MAR Hold] aspirin EC  81 mg Oral Daily   [MAR Hold] atorvastatin  80 mg Oral Daily   [MAR Hold] isosorbide mononitrate  15 mg Oral Daily   [MAR Hold] levothyroxine  50 mcg Oral Q0600   [MAR Hold] melatonin  5 mg Oral QHS   [MAR Hold] metoprolol succinate  25 mg Oral Daily   [MAR Hold] mexiletine  150 mg Oral BID   [MAR Hold] midodrine  2.5 mg Oral BID WC   [MAR Hold] pantoprazole  40 mg Oral QAC breakfast   [MAR Hold] sodium chloride flush  3 mL Intravenous Q12H   Continuous Infusions:  sodium chloride     sodium chloride 50 mL/hr at 10/15/22 0800   heparin Stopped (10/15/22 1026)   PRN Meds:    Vital Signs    Vitals:   10/15/22 0500 10/15/22 0545 10/15/22 0754 10/15/22 0945  BP: 121/66 (!) 115/43 104/75 (!) 120/55  Pulse: 64 62 77 62  Resp: 18 18 (!) 22 20  Temp:   97.8 F (36.6 C)   TempSrc:      SpO2: 97% 94% 94% 93%  Weight:      Height:        Intake/Output Summary (Last 24 hours) at 10/15/2022 1026 Last data filed at 10/14/2022 1803 Gross per 24 hour  Intake --  Output 1175 ml  Net -1175 ml      10/14/2022    9:30 AM 10/14/2022    8:32 AM 09/01/2022   12:02 PM  Last 3 Weights  Weight (lbs) 203 lb 203 lb 204 lb  Weight (kg) 92.08 kg 92.08 kg 92.534 kg      Telemetry    A paced rhythm. - Personally Reviewed  ECG    No new ECG tracing today. - Personally Reviewed  Physical Exam   GEN: No acute distress.   Neck: No JVD. Cardiac: RRR. Possible very soft systolic murmur noted along left lower sternal border. Radial pulses 2+ and equal bilaterally. Respiratory: Clear to auscultation bilaterally. No  wheezes, rhonchi, or rales. GI: Soft, non-distended, and non-tender. MS: No lower extremity edema. No deformity. Skin: Warm and dry. Neuro:  No focal deficits. Psych: Normal affect.   Labs    High Sensitivity Troponin:   Recent Labs  Lab 10/14/22 0857 10/14/22 1017  TROPONINIHS 9 9     Chemistry Recent Labs  Lab 10/14/22 1017 10/15/22 0345 10/15/22 0900  NA 136 139 142  K 4.2 4.2 4.4  CL 101 100 101  CO2 '24 29 31  '$ GLUCOSE 103* 104* 97  BUN 32* 41* 38*  CREATININE 1.47* 1.59* 1.49*  CALCIUM 9.6 9.5 9.4  PROT 7.3  --   --   ALBUMIN 3.8  --   --   AST 24  --   --   ALT 17  --   --   ALKPHOS 99  --   --   BILITOT 0.7  --   --   GFRNONAA 45* 41* 45*  ANIONGAP '11 10 10    '$ Lipids No results for input(s): "CHOL", "TRIG", "HDL", "LABVLDL", "LDLCALC", "CHOLHDL" in the last  168 hours.  Hematology Recent Labs  Lab 10/14/22 1017  WBC 8.9  RBC 4.65  HGB 14.7  HCT 45.0  MCV 96.8  MCH 31.6  MCHC 32.7  RDW 14.2  PLT 152   Thyroid No results for input(s): "TSH", "FREET4" in the last 168 hours.  BNP Recent Labs  Lab 10/14/22 1017  BNP 202.0*    DDimer No results for input(s): "DDIMER" in the last 168 hours.   Radiology    CT Angio Chest/Abd/Pel for Dissection W and/or Wo Contrast  Result Date: 10/14/2022 CLINICAL DATA:  Acute aortic syndrome (AAS) suspected evaluate pleural fluid as well as aortic aneurysm repair EXAM: CT ANGIOGRAPHY CHEST, ABDOMEN AND PELVIS TECHNIQUE: Non-contrast CT of the chest was initially obtained. Multidetector CT imaging through the chest, abdomen and pelvis was performed using the standard protocol during bolus administration of intravenous contrast. Multiplanar reconstructed images and MIPs were obtained and reviewed to evaluate the vascular anatomy. RADIATION DOSE REDUCTION: This exam was performed according to the departmental dose-optimization program which includes automated exposure control, adjustment of the mA and/or kV according to  patient size and/or use of iterative reconstruction technique. CONTRAST:  43m OMNIPAQUE IOHEXOL 350 MG/ML SOLN COMPARISON:  08/28/2022 and previous FINDINGS: CTA CHEST FINDINGS Cardiovascular: Stable left subclavian AICD. SVC patent. Heart size normal. No pericardial effusion. The RV is nondilated. Incomplete opacification of the pulmonary arterial tree, with no convincing filling defects to suggest acute PE. Mitral annulus calcifications. 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 Scattered coronary calcifications, post CABG. Good contrast opacification of the thoracic aorta, no with no evidence of dissection or stenosis. Moderate calcified atheromatous plaque particularly in the descending segment. Stable 1.6 cm penetrating atheromatous ulcer in the descending thoracic aorta at the level of the left mainstem bronchus. No intramural hematoma. Aortic Root: --Valve: 3 cm --Sinuses: 3.6 cm --Sinotubular Junction: 3.2 cm Limitations by motion: Mild Thoracic Aorta: --Ascending Aorta: 4 cm --Aortic Arch: 3.9 cm --Descending Aorta: 4 cm Mediastinum/Nodes: No mass or adenopathy.  Small hiatal hernia. Lungs/Pleura: Chronic small left pleural effusion. No pneumothorax. Stable subsegmental atelectasis/scarring in the lung bases left greater than right. Musculoskeletal: Sternotomy wires. Anterior vertebral endplate spurring at multiple levels in the mid and lower thoracic spine. Review of the MIP images confirms the above findings. CTA ABDOMEN AND PELVIS FINDINGS VASCULAR Aorta: Patent bifurcated infrarenal stent graft. Persistent type 2 endoleak at the level of the IMA origin. 5 x 4.8 cm native sac diameter (previously 5.1 x 4.7 by my measurement). No evidence of leak or impending rupture. Celiac: Patent without evidence of aneurysm, dissection, vasculitis or significant stenosis. SMA: Patent without evidence of aneurysm, dissection, vasculitis or significant stenosis. Renals:  Single right, with calcified ostial plaque resulting in short segment mild stenosis, patent distally. Single left, with mild short-segment proximal stenosis, atheromatous but patent distally. IMA: Continued patency, probably with retrograde flow contributing to the endoleak. Inflow: Stent graft limbs extend to distal common iliac arteries, well apposed. Native iliac arterial systems have scattered calcified plaque, no dissection, aneurysm, or stenosis. Veins: No obvious venous abnormality within the limitations of this arterial phase study. Review of the MIP images confirms the above findings. NON-VASCULAR Hepatobiliary: No focal liver abnormality is seen. No gallstones, gallbladder wall thickening, or biliary dilatation. Pancreas: Unremarkable. No pancreatic ductal dilatation or surrounding inflammatory changes. Spleen: Normal in size without focal abnormality. Adrenals/Urinary Tract: No adrenal mass. Stable 5.8 cm 9 HU left upper pole renal cyst, and  4.1 cm 24 HU right upper pole renal cyst; no follow-up required. No hydronephrosis or solid renal lesion. Urinary bladder physiologically distended. Stomach/Bowel: Small hiatal hernia. Stomach incompletely distended, unremarkable. 1.7 cm lipoma intraluminal in the mid duodenum stable. Small bowel decompressed. Normal appendix. Colon is incompletely distended by gas and fecal material, with scattered descending and sigmoid diverticula; no adjacent inflammatory change. Lymphatic: No abdominal or pelvic adenopathy. Reproductive: Prostate enlargement. Penile pump components partially visualized. Other: No ascites.  No free air. Musculoskeletal: Multilevel lumbar spondylitic change. No fracture or worrisome bone lesion. Review of the MIP images confirms the above findings. IMPRESSION: 1. Negative for acute PE or thoracic aortic dissection. 2. Chronic small left pleural effusion. 3. 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 4. Stable 1.6 cm penetrating atheromatous ulcer in the  descending thoracic aorta. 5. Patent infrarenal stent graft with persistent type 2 endoleak, stable 5 cm native sac diameter. 6. Small hiatal hernia. 7. Descending and sigmoid diverticulosis. Electronically Signed   By: Lucrezia Europe M.D.   On: 10/14/2022 16:19   DG Chest 2 View  Result Date: 10/14/2022 CLINICAL DATA:  Cough and shortness of breath. EXAM: CHEST - 2 VIEW COMPARISON:  Chest two views 02/26/2022, CT chest 07/24/2022, CTA abdomen and pelvis 08/28/2022, CT chest 02/22/2022 FINDINGS: Left chest wall cardiac AICD with leads overlying the right atrium and right ventricle, similar to prior. Status post median sternotomy and CABG. Cardiac silhouette and mediastinal contours are within normal limits with moderate calcification again seen within the aortic arch. The right lung appears clear. Note is made of small left pleural effusion and basilar atelectasis within the left lower lung on 07/24/2022 and 08/28/2022 CTs, not as well seen on the most recent 02/26/2022 chest radiograph. There is similar left retrocardiac and costophrenic angle opacification as on 02/26/2022 prior frontal chest radiograph, and there may be minimal left basilar atelectasis and pleural fluid remaining. The right lung is clear. No pneumothorax. Moderate multilevel degenerative disc changes of the thoracic spine. IMPRESSION: The prior small left pleural effusion and left basilar atelectasis seen on 02/22/2022, 07/24/2022, and 08/28/2022 CTs was not well visualized on prior 02/26/2022 radiographs. The appearance is similar on the current study, and there is again mild left retrocardiac and costophrenic angle opacification. This likely represents mild chronic left basilar atelectasis and pleural fluid. Electronically Signed   By: Yvonne Kendall M.D.   On: 10/14/2022 11:01    Cardiac Studies   Myoview 11/21/2021:   Findings are consistent with prior myocardial infarction. The study is high risk.   There were ST depressions in the  anterior leads and frequent brief runs of NSVT as well as isolated PVCs during stress.   LV perfusion is abnormal. There is a medium sized, severe fixed defect in the basal-to-apical inferior and inferolateral LV walls. There is a small fixed defect in the basal-to-mid anterolateral LV wall. There is abnormal wall motion in the defect area. Consistent with infarction.   Left ventricular function is abnormal. Nuclear stress EF: 37 %. The left ventricular ejection fraction is moderately decreased (30-44%). End diastolic cavity size is moderately enlarged.   Prior study available for comparison from 08/28/2020. Compared to prior study, the inferior and inferolateral defect is now seen in the apical segments as well as the basal-to-mid segments. There is also a basal-to-mid anterolateral fixed defect.  _______________  Limited Echocardiogram 02/23/2022: Impressions: 1. There is a small aneurysm of the basal inferolateral wall. Thrombus is  not seen. Left ventricular ejection fraction, by estimation,  is 40 to 45%.  The left ventricle has mildly decreased function. The left ventricle  demonstrates regional wall motion  abnormalities (see scoring diagram/findings for description). There is  mild concentric left ventricular hypertrophy. Left ventricular diastolic  function could not be evaluated.   2. Right ventricular systolic function is normal. The right ventricular  size is normal. There is normal pulmonary artery systolic pressure. The  estimated right ventricular systolic pressure is 40.0 mmHg.   3. 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 A small  partly mobile calcified component may be present. The mitral valve is  degenerative. No evidence of mitral stenosis. Moderate to severe mitral  annular calcification.   4. Tricuspid valve regurgitation is mild to moderate.   5. The aortic valve is tricuspid. There is moderate calcification of the  aortic valve. There is moderate  thickening of the aortic valve. Aortic  valve regurgitation is not visualized. Mild to moderate aortic valve  stenosis.   6. There is borderline dilatation of the ascending aorta, measuring 39  mm.   7. The inferior vena cava is normal in size with greater than 50%  respiratory variability, suggesting right atrial pressure of 3 mmHg.   Comparison(s): No significant change from prior study. Prior images  reviewed side by side.    Patient Profile     89 y.o. male with a history of CAD s/p CABG x5 in 11/2010 with subsequent NSTEMI in 09/2018 which was treated medically, ischemic cardiomyopathy/ chronic HFrEF with EF of 40-45% on last Echo in 02/2022, VT and syncope s/p dual chamber Biotronik ICD in 02/2022, paroxysmal atrial fibrillation on Eliquis,  chronic incompetence, spontaneous pneumothorax in 12/2018 with subsequent recurrent left pleural effusion s/p VATS in 03/2019, bilateral carotid diease s/p right TCAR in 08/2020 and left TCAR in 01/2021, AAA s/p EVAR in 07/2022, orthostatic hypotension on Midodrine, hyperlipidemia, prior PE after CABG, obstructive sleep apnea on CPAP who was admitted on 10/14/2022 for further evaluation of chest pain.  Assessment & Plan    Chest Pain Dyspnea on Exertion CAD History of CABG x5 in 2012. Last LHC in 09/2018 showed occluded SVT to Diag and occluded SVG to PDA but patent LIMA to LAD and patent SVG to OM but occluded sequential limb to distal LCX. Medical therapy was recommended at that time. Myoview in 11/2021 showed evidence of prior MI. He now presents worsening chest pain, dyspnea on exertion, and fatigue over the last 3 weeks. He has some typical and atypical symptoms. High-sensitivity troponin negative x2. EKG showed an atrial paced rhythm.  - He had some chest pain overnight but chest pain free now. - Continue Imdur '15mg'$  daily and Toprol-XL '25mg'$  daily. - Not on aspirin at home due to Eliquis.  - Continue Lipitor '80mg'$  daily. - Plans is for right/left  cardiac catheterization today. Consented yesterday.  Ischemic Cardiomyopathy Chronic HFrEF Last Echo in 02/2022 showed LVEF of 40-45% with akinesis of mid inferolateral segment and basal anterolateral segment as well as aneurysmal basal inferolateral segment. BNP 202 which is lower than it has been in the past. Chest x-ray showed small left pleural effusion similar to prior studies. Chest CTA negative for PE or dissection. Received one dose of IV Lasix '20mg'$  in the ED with good urine output. - Euvolemic on exam.  -  Hold diuretics until after cath. - GDMT has been limited in the past due to orthostatic hypotension requiring Midodrine.  Persistent Atrial Fibrillation Last device check in 08/2022 showed 97.5%  atrial fibrillation burden. - Currently a-paced. - Continue Toprol-XL '25mg'$  daily. - On chronic anticoagulation with Eliquis. Currently on hold in anticipation of cardiac catheterization.  VT s/p ICD History of VT s/p ICD in 02/2022. - No recent issues. - Continue Toprol-XL '25mg'$  daily. - Continue Mexiletine '150mg'$  twice daily.  Orthostatic Hypotension BP well controlled.  - Continue Toprol-XL and Imdur as above.  - Continue home Midrodine 2.'5mg'$  twice daily.  CKD Stage III Creatinine 1.47 on admission. Baseline around 1.2 to 1.5. - Creatinine 1.59 this morning.  - Fluids increased to 50cc/hr prior to to cath.  Hypothyroidism - Continue home Synthroid.  For questions or updates, please contact [facility] Please consult [email or web address] for contact info under        Signed, Darreld Mclean, PA-C  10/15/2022, 10:26 AM   As above, patient seen and examined.  Patient denies dyspnea this morning though predominant symptoms previously were with exertion.  He has "a little" chest pain.  As outlined previously symptoms have both typical and atypical features but they are progressive.  Will plan to proceed with right and left cardiac catheterization.  Note creatinine mildly  increased.  We have hydrated prior to procedure.  Limit dye.  No ventriculogram.  Follow renal function closely after procedure.  Resume apixaban once all procedures complete.  Will need to resume diuretics prior to discharge. Kirk Ruths, MD

## 2022-10-16 ENCOUNTER — Other Ambulatory Visit: Payer: Self-pay | Admitting: Student

## 2022-10-16 ENCOUNTER — Encounter (HOSPITAL_COMMUNITY): Payer: Self-pay | Admitting: Cardiovascular Disease

## 2022-10-16 DIAGNOSIS — I5042 Chronic combined systolic (congestive) and diastolic (congestive) heart failure: Secondary | ICD-10-CM | POA: Insufficient documentation

## 2022-10-16 DIAGNOSIS — I472 Ventricular tachycardia, unspecified: Secondary | ICD-10-CM | POA: Insufficient documentation

## 2022-10-16 DIAGNOSIS — I951 Orthostatic hypotension: Secondary | ICD-10-CM | POA: Insufficient documentation

## 2022-10-16 DIAGNOSIS — I5022 Chronic systolic (congestive) heart failure: Secondary | ICD-10-CM | POA: Insufficient documentation

## 2022-10-16 DIAGNOSIS — I480 Paroxysmal atrial fibrillation: Secondary | ICD-10-CM | POA: Diagnosis not present

## 2022-10-16 DIAGNOSIS — I255 Ischemic cardiomyopathy: Secondary | ICD-10-CM | POA: Insufficient documentation

## 2022-10-16 DIAGNOSIS — R079 Chest pain, unspecified: Secondary | ICD-10-CM | POA: Diagnosis not present

## 2022-10-16 DIAGNOSIS — Z1152 Encounter for screening for COVID-19: Secondary | ICD-10-CM | POA: Diagnosis not present

## 2022-10-16 DIAGNOSIS — I4819 Other persistent atrial fibrillation: Secondary | ICD-10-CM | POA: Diagnosis not present

## 2022-10-16 DIAGNOSIS — I350 Nonrheumatic aortic (valve) stenosis: Secondary | ICD-10-CM

## 2022-10-16 DIAGNOSIS — I729 Aneurysm of unspecified site: Secondary | ICD-10-CM | POA: Insufficient documentation

## 2022-10-16 DIAGNOSIS — I200 Unstable angina: Secondary | ICD-10-CM

## 2022-10-16 DIAGNOSIS — Z951 Presence of aortocoronary bypass graft: Secondary | ICD-10-CM | POA: Diagnosis not present

## 2022-10-16 LAB — POCT I-STAT EG7
Acid-Base Excess: 5 mmol/L — ABNORMAL HIGH (ref 0.0–2.0)
Bicarbonate: 32.1 mmol/L — ABNORMAL HIGH (ref 20.0–28.0)
Calcium, Ion: 1.32 mmol/L (ref 1.15–1.40)
HCT: 44 % (ref 39.0–52.0)
Hemoglobin: 15 g/dL (ref 13.0–17.0)
O2 Saturation: 67 %
Potassium: 4.4 mmol/L (ref 3.5–5.1)
Sodium: 141 mmol/L (ref 135–145)
TCO2: 34 mmol/L — ABNORMAL HIGH (ref 22–32)
pCO2, Ven: 53.8 mmHg (ref 44–60)
pH, Ven: 7.384 (ref 7.25–7.43)
pO2, Ven: 36 mmHg (ref 32–45)

## 2022-10-16 LAB — BASIC METABOLIC PANEL
Anion gap: 7 (ref 5–15)
BUN: 32 mg/dL — ABNORMAL HIGH (ref 8–23)
CO2: 27 mmol/L (ref 22–32)
Calcium: 9.1 mg/dL (ref 8.9–10.3)
Chloride: 106 mmol/L (ref 98–111)
Creatinine, Ser: 1.1 mg/dL (ref 0.61–1.24)
GFR, Estimated: >60 mL/min (ref >60)
Glucose, Bld: 87 mg/dL (ref 70–99)
Potassium: 4.2 mmol/L (ref 3.5–5.1)
Sodium: 140 mmol/L (ref 135–145)

## 2022-10-16 LAB — POCT ACTIVATED CLOTTING TIME: Activated Clotting Time: 239 seconds

## 2022-10-16 LAB — CBC
HCT: 44.3 % (ref 39.0–52.0)
Hemoglobin: 14.4 g/dL (ref 13.0–17.0)
MCH: 31.4 pg (ref 26.0–34.0)
MCHC: 32.5 g/dL (ref 30.0–36.0)
MCV: 96.5 fL (ref 80.0–100.0)
Platelets: 135 10*3/uL — ABNORMAL LOW (ref 150–400)
RBC: 4.59 MIL/uL (ref 4.22–5.81)
RDW: 14.2 % (ref 11.5–15.5)
WBC: 8.3 10*3/uL (ref 4.0–10.5)
nRBC: 0 % (ref 0.0–0.2)

## 2022-10-16 LAB — APTT: aPTT: 28 seconds (ref 24–36)

## 2022-10-16 MED ORDER — SPIRONOLACTONE 25 MG PO TABS: 12.5000 mg | ORAL_TABLET | Freq: Every evening | ORAL | 2 refills | Status: DC

## 2022-10-16 MED ORDER — APIXABAN 5 MG PO TABS
5.0000 mg | ORAL_TABLET | Freq: Two times a day (BID) | ORAL | Status: DC
  Administered 2022-10-16: 5 mg via ORAL
  Filled 2022-10-16: qty 1

## 2022-10-16 MED ORDER — APIXABAN 5 MG PO TABS: 5.0000 mg | ORAL_TABLET | Freq: Two times a day (BID) | ORAL | Status: DC

## 2022-10-16 NOTE — Progress Notes (Signed)
Patient given discharge instructions. Aldactone Rx given to patient. Patient taken to discharge lounge in wheelchair.   Daymon Larsen, RN

## 2022-10-16 NOTE — Discharge Instructions (Signed)
Medication Changes: - DECREASE Spironolactone to 12.'5mg'$  daily. - Continue all other home medications.  Heart Failure Education: Weigh yourself EVERY morning after you go to the bathroom but before you eat or drink anything. Write this number down in a weight log/diary. If you gain 3 pounds overnight or 5 pounds in a week, call the office. Take your medicines as prescribed. If you have concerns about your medications, please call us before you stop taking them.  Eat low salt foods--Limit salt (sodium) to 2000 mg per day. This will help prevent your body from holding onto fluid. Read food labels as many processed foods have a lot of sodium, especially canned goods and prepackaged meats. If you would like some assistance choosing low sodium foods, we would be happy to set you up with a nutritionist. Limit all fluids for the day to less than 2 liters (64 ounces). Fluid includes all drinks, coffee, juice, ice chips, soup, jello, and all other liquids. Stay as active as you can everyday. Staying active will give you more energy and make your muscles stronger. Start with 5 minutes at a time and work your way up to 30 minutes a day. Break up your activities--do some in the morning and some in the afternoon. Start with 3 days per week and work your way up to 5 days as you can.  If you have chest pain, feel short of breath, dizzy, or lightheaded, STOP. If you don't feel better after a short rest, call 911. If you do feel better, call the office to let us know you have symptoms with exercise.

## 2022-10-16 NOTE — Discharge Summary (Addendum)
Discharge Summary    Patient ID: CAMARA ROSANDER MRN: [id number]; DOB: 1931/09/01  Admit date: 10/14/2022 Discharge date: 10/16/2022  PCP:  Shon Baton, MD   [place] Providers Cardiologist:  None  Electrophysiologist:  Vickie Epley, MD  Advanced Heart Failure:  [name], MD  {   Discharge Diagnoses    Principal Problem:   Chest pain Active Problems:   CAD s/p CABG 3/12    Persistent atrial fibrillation ([place])   Aortic stenosis   CKD (chronic kidney disease), stage III ([place])   Hypothyroidism   Carotid stenosis   AAA (abdominal aortic aneurysm) ([place])   Ischemic cardiomyopathy   Chronic HFrEF (heart failure with reduced ejection fraction) ([place])   VT s/p ICD   Orthostatic hypotension   Pseudoaneurysm ([place])    Diagnostic Studies/Procedures    Right/Left Cardiac Catheterization 10/15/2022:   Ost Cx to Prox Cx lesion is 50% stenosed.   Ost Ramus lesion is 100% stenosed.   Origin lesion is 100% stenosed.   Origin to Mid Graft lesion between Ramus and 3rd Mrg  is 100% stenosed.   Origin lesion is 100% stenosed.   Ost 1st Diag lesion is 70% stenosed.   Ost 1st Mrg lesion is 80% stenosed.   Mid RCA lesion is 50% stenosed.   Dist RCA lesion is 50% stenosed.   Prox LAD lesion is 50% stenosed.   SVG graft was visualized by angiography.   SVG graft was not injected.   SVG graft was not injected.   Three vessel CAD s/p 5V CABG with 2/5 patent bypass grafts Moderate proximal LAD disease. Patent LIMA to LAD. Known occlusion of SVG to Diagonal.  Moderate ostial Proximal Circumflex stenosis. Patent SVG to intermediate branch. Known occlusion of sequential segment of vein graft to OM.  Patent native RCA with moderate non-obstructive in the mid and distal vessel. Known occlusion of SVG to PDA.  Normal right and left heart pressures.    Recommendations: Continue medical management of CAD. No focal targets for PCI.   Diagnostic Dominance: Right      _____________   History of Present Illness     JONCARLO FRIBERG is a 89 y.o. male with a history of CAD s/p CABG x5 in 11/2010 with subsequent NSTEMI in 09/2018 which was treated medically, ischemic cardiomyopathy/ chronic HFrEF with EF of 40-45% on last Echo in 02/2022, VT and syncope s/p dual chamber Biotronik ICD in 02/2022, persistent atrial fibrillation on Eliquis,  chronic incompetence, spontaneous pneumothorax in 12/2018 with subsequent recurrent left pleural effusion s/p VATS in 03/2019, bilateral carotid diease s/p right TCAR in 08/2020 and left TCAR in 01/2021, AAA s/p EVAR in 07/2022, orthostatic hypotension on Midodrine, hyperlipidemia, prior PE after CABG, and obstructive sleep apnea on CPAP who is followed by Dr. Aundra Dubin and Dr. Quentin Ore and was admitted to [facility] on 10/14/2022 for further evaluation of chest pain.  Patient was seen by Samella Parr, NP, on 10/14/2022 for further evaluation of worsening dyspnea. He also reported generalized weakness for the past 3 weeks. He stated that he had a positive home COVID test at that time but was seen at a [facility] on 10/05/2022 and tested negative for COVID, RSV, and influenza. He declined a chest x-ray at that time. He reported at his office visit that he was more short of breath when walking short distance. He also reported having a non-productive cough. He noted rare atypical chest pain and dizziness when standing. He  denied any orthopnea, PND, edema, palpitations, or abnormal bleeding. He was felt to have NYHA Class III-IIIb symptoms but was not volume overloaded on exam. Deconditioning and lung disease (recurrent left effusion and history of pneumothorax with likely scarring) were felt to be playing a role. Initial plan was to have him follow-up in 3-4 weeks. However, at the end of the visit, he reported he was currently having 6/10 chest pain. Therefore, he was advised to go to the ED for further evaluation.  Upon arrival to  the ED, vitals were normal. EKG showed an atrial paced rhythm with RBBB. High sensitivity troponin negative x2 (9 >> 9). BNP mildly elevated at 202 which is lower than it has been in the past. Chest x-ray showed a small left pleural effusion and left basilar atelectasis similar to prior studies. Chest/ abdominal/ pelvic CTA was negative for acute PE or thoracic aortic dissection but showed a chronic small left pleural effusion, stable 3.5cm LV pseudoaneurysm, stable 1.6 cm penetrating atheromatous ulcer in the descending thoracic aorta, patent infrarenal stent graft with persistent type 2 endoleak but stable 5cm native sac diameter, and small hiatal hernia. WBC 8.9, Hgb 14.7, Plts 152. Na 136, K 4.2, Glucose 103, BUN 32, Cr 1.47. LFTS normal. Respiratory panel negative for COVID, RSV, and influenza A/B. Patient was admitted under Cardiology service for further evaluation.  Hospital Course     Consultants: None  Chest Pain Dyspnea on Exertion CAD s/p CABG x5 in 2012 Patient presented with worsening chest pain, dyspnea on exertion, and fatigue over the last 3 weeks. He was noted to have some typical and atypical symptoms. EKG showed an atrial paced rhythm with RBBB. High sensitivity troponin negative x2 (9 >> 9). BNP mildly elevated at 202. BNP mildly elevated at 202 which is lower than it has been in the past. Chest x-ray showed a small left pleural effusion and left basilar atelectasis similar to prior studies. Chest/ abdominal/ pelvic CTA was negative for acute PE or thoracic aortic dissection but showed a chronic small left pleural effusion. Decision was made to proceed with right/left cardiac catheterization which showed 2/5 patent bypass graft with patent LIMA to LAD and SVG to intermediate branch and known occluded SVT to Diag, sequential segment of SVG to OM, and occluded SVG to PDA (but patent native RCA). Also showed normal right and left heart filling pressure. There were no focal targets for PCI  so continued medical management was recommended. Continue Imdur '15mg'$  daily and Toprol-XL '25mg'$  daily. Continue Lipitor '80mg'$  daily. He is not on Aspirin due to need for Eliquis.   Ischemic Cardiomyopathy  Chronic HFrEF Chronic Left Pleural Effusion Last Echo in 02/2022 showed LVEF of 40-45% with akinesis of mid inferolateral segment and basal anterolateral segment as well as aneurysmal basal inferolateral segment. BNP 202 which is lower than it has been in the past. Chest x-ray and CTA showed chronic left pleural effusion. He received one dose of IV Lasix in the ED with good urine output. Right and left filling pressures were normal on right heart catheterization. GDMT has been limited by orthostatic hypotension in the past. Will continue home Lasix '20mg'$  daily but decrease Spironolactone to 12.'5mg'$  daily. Continue home Imdur '15mg'$  daily and Toprol-XL '25mg'$  daily. He is now off ARB due to orthostatic hypotension and has not been able to tolerate Farxiga in the past.   Persistent Atrial Fibrillation Last device check in 08/2022 showed 97.5% atrial fibrillation burden. He was previously on Amiodarone but this was stopped  due to concern for pulmonary toxicity. Atrial paced this admission. Continue Toprol-XL '25mg'$  daily. Continue chronic anticoagulation with Eliquis '5mg'$  twice daily. This was initially held for cardiac catheterization but was resumed on day of discharge.   VT s/p ICD History of VT s/p ICD in 02/2022. No recent issues. Continue Toprol-XL '25mg'$  daily and Mexiletine '150mg'$  twice daily.  Aortic Stenosis Last Echo in 02/2022 showed mild to moderate aortic stenosis. He does have a noticeable murmur on day of discharge. Will order outpatient Echo to reassess this.   Orthostatic Hypotension He has a history of orthostatic hypotension and is on Midodrine. No acute issues this admission. Continue low dose Toprol-XL, Imdur, and Spironolactone as above. Continue Midodrine 2.'5mg'$  twice  daily.  Hyperlipidemia Continue home Lipitor '80mg'$  daily.  Carotid Stenosis History of bilateral carotid stenosis s/p right TCAR in 08/2020 and left TCAR in 01/2021. Most recent dopplers in 12/2021 showed patent stent bilaterally. No Aspirin due to need for Eliquis. Continue statin. Followed by Vascular Surgery as an outpatient.  AAA S/p EVAR in 08/2022. CTA this admission showed patent infrarenal stent graft with persistent type 2 endoleak and stable 5cm native sac diameter. Followed by Vascular as an outpatient.   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 CTA this admission showed a stable 3.5 LV pseudoaneurysm.   CKD Stage III Creatinine 1.47 on admission. Baseline around 1.2 to 1.5. - Creatinine 1.59 this morning.  - Fluids increased to 50cc/hr prior to to cath.   Hypothyroidism Continue home Synthroid.  Patient seen and examined by Dr. Stanford Breed today and determined to be stable for discharge. Outpatient follow-up arranged. Medications as below.      Did the patient have an acute coronary syndrome (MI, NSTEMI, STEMI, etc) this admission?:  No                               Did the patient have a percutaneous coronary intervention (stent / angioplasty)?:  No.    _____________  Discharge Vitals Blood pressure 112/65, pulse 65, temperature 98.2 F (36.8 C), temperature source Oral, resp. rate (!) 23, height '6\' 3"'$  (1.905 m), weight 92.1 kg, SpO2 97 %.  Filed Weights   10/14/22 0930 10/15/22 1648  Weight: 92.1 kg 92.1 kg   General: 89 y.o. Caucasian male resting comfortably in no acute distress. HEENT: Normocephalic and atraumatic. Sclera clear.  Neck: Supple. No JVD. Heart: RRR. III/VI systolic murmur. No gallops or rubs. Radial pulses 2+ and equal bilaterally. Left radial cath site soft and stable with no signs of hematoma.  Lungs: No increased work of breathing. Clear to ausculation bilaterally. No wheezes, rhonchi, or rales.  Abdomen:  Soft, non-distended, and non-tender to palpation.  Extremities: No lower extremity edema.    Skin: Warm and dry. Neuro: Alert and oriented x3. No focal deficits. Psych: Normal affect. Responds appropriately.   Labs & Radiologic Studies    CBC Recent Labs    10/14/22 1017 10/15/22 1121 10/15/22 1135 10/16/22 0203  WBC 8.9  --   --  8.3  NEUTROABS 6.3  --   --   --   HGB 14.7   < > 14.3 14.4  HCT 45.0   < > 42.0 44.3  MCV 96.8  --   --  96.5  PLT 152  --   --  135*   < > = values in this interval not displayed.   Basic Metabolic Panel Recent  Labs    10/15/22 0900 10/15/22 1121 10/15/22 1135 10/16/22 0203  NA 142   < > 139 140  K 4.4   < > 4.3 4.2  CL 101  --   --  106  CO2 31  --   --  27  GLUCOSE 97  --   --  87  BUN 38*  --   --  32*  CREATININE 1.49*  --   --  1.10  CALCIUM 9.4  --   --  9.1   < > = values in this interval not displayed.   Liver Function Tests Recent Labs    10/14/22 1017  AST 24  ALT 17  ALKPHOS 99  BILITOT 0.7  PROT 7.3  ALBUMIN 3.8   No results for input(s): "LIPASE", "AMYLASE" in the last 72 hours. High Sensitivity Troponin:   Recent Labs  Lab 10/14/22 0857 10/14/22 1017  TROPONINIHS 9 9    BNP Invalid input(s): "POCBNP" D-Dimer No results for input(s): "DDIMER" in the last 72 hours. Hemoglobin A1C No results for input(s): "HGBA1C" in the last 72 hours. Fasting Lipid Panel No results for input(s): "CHOL", "HDL", "LDLCALC", "TRIG", "CHOLHDL", "LDLDIRECT" in the last 72 hours. Thyroid Function Tests No results for input(s): "TSH", "T4TOTAL", "T3FREE", "THYROIDAB" in the last 72 hours.  Invalid input(s): "FREET3" _____________  CARDIAC CATHETERIZATION  Result Date: 10/15/2022   Colon [place] Cx to Prox Cx lesion is 50% stenosed.   Ost Ramus lesion is 100% stenosed.   Origin lesion is 100% stenosed.   Origin to Mid Graft lesion between Ramus and 3rd Mrg  is 100% stenosed.   Origin lesion is 100% stenosed.   Ost 1st Diag lesion is 70%  stenosed.   Ost 1st Mrg lesion is 80% stenosed.   Mid RCA lesion is 50% stenosed.   Dist RCA lesion is 50% stenosed.   Prox LAD lesion is 50% stenosed.   SVG graft was visualized by angiography.   SVG graft was not injected.   SVG graft was not injected. Three vessel CAD s/p 5V CABG with 2/5 patent bypass grafts Moderate proximal LAD disease. Patent LIMA to LAD. Known occlusion of SVG to Diagonal. Moderate ostial Proximal Circumflex stenosis. Patent SVG to intermediate branch. Known occlusion of sequential segment of vein graft to OM. Patent native RCA with moderate non-obstructive in the mid and distal vessel. Known occlusion of SVG to PDA. Normal right and left heart pressures. Recommendations: Continue medical management of CAD. No focal targets for PCI.   CT Angio Chest/Abd/Pel for Dissection W and/or Wo Contrast  Result Date: 10/14/2022 CLINICAL DATA:  Acute aortic syndrome (AAS) suspected evaluate pleural fluid as well as aortic aneurysm repair EXAM: CT ANGIOGRAPHY CHEST, ABDOMEN AND PELVIS TECHNIQUE: Non-contrast CT of the chest was initially obtained. Multidetector CT imaging through the chest, abdomen and pelvis was performed using the standard protocol during bolus administration of intravenous contrast. Multiplanar reconstructed images and MIPs were obtained and reviewed to evaluate the vascular anatomy. RADIATION DOSE REDUCTION: This exam was performed according to the departmental dose-optimization program which includes automated exposure control, adjustment of the mA and/or kV according to patient size and/or use of iterative reconstruction technique. CONTRAST:  85m OMNIPAQUE IOHEXOL 350 MG/ML SOLN COMPARISON:  08/28/2022 and previous FINDINGS: CTA CHEST FINDINGS Cardiovascular: Stable left subclavian AICD. SVC patent. Heart size normal. No pericardial effusion. The RV is nondilated. Incomplete opacification of the pulmonary arterial tree, with no convincing filling defects to suggest acute PE.  Mitral  annulus calcifications. 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 Scattered coronary calcifications, post CABG. Good contrast opacification of the thoracic aorta, no with no evidence of dissection or stenosis. Moderate calcified atheromatous plaque particularly in the descending segment. Stable 1.6 cm penetrating atheromatous ulcer in the descending thoracic aorta at the level of the left mainstem bronchus. No intramural hematoma. Aortic Root: --Valve: 3 cm --Sinuses: 3.6 cm --Sinotubular Junction: 3.2 cm Limitations by motion: Mild Thoracic Aorta: --Ascending Aorta: 4 cm --Aortic Arch: 3.9 cm --Descending Aorta: 4 cm Mediastinum/Nodes: No mass or adenopathy.  Small hiatal hernia. Lungs/Pleura: Chronic small left pleural effusion. No pneumothorax. Stable subsegmental atelectasis/scarring in the lung bases left greater than right. Musculoskeletal: Sternotomy wires. Anterior vertebral endplate spurring at multiple levels in the mid and lower thoracic spine. Review of the MIP images confirms the above findings. CTA ABDOMEN AND PELVIS FINDINGS VASCULAR Aorta: Patent bifurcated infrarenal stent graft. Persistent type 2 endoleak at the level of the IMA origin. 5 x 4.8 cm native sac diameter (previously 5.1 x 4.7 by my measurement). No evidence of leak or impending rupture. Celiac: Patent without evidence of aneurysm, dissection, vasculitis or significant stenosis. SMA: Patent without evidence of aneurysm, dissection, vasculitis or significant stenosis. Renals: Single right, with calcified ostial plaque resulting in short segment mild stenosis, patent distally. Single left, with mild short-segment proximal stenosis, atheromatous but patent distally. IMA: Continued patency, probably with retrograde flow contributing to the endoleak. Inflow: Stent graft limbs extend to distal common iliac arteries, well apposed. Native iliac arterial systems have scattered calcified  plaque, no dissection, aneurysm, or stenosis. Veins: No obvious venous abnormality within the limitations of this arterial phase study. Review of the MIP images confirms the above findings. NON-VASCULAR Hepatobiliary: No focal liver abnormality is seen. No gallstones, gallbladder wall thickening, or biliary dilatation. Pancreas: Unremarkable. No pancreatic ductal dilatation or surrounding inflammatory changes. Spleen: Normal in size without focal abnormality. Adrenals/Urinary Tract: No adrenal mass. Stable 5.8 cm 9 HU left upper pole renal cyst, and 4.1 cm 24 HU right upper pole renal cyst; no follow-up required. No hydronephrosis or solid renal lesion. Urinary bladder physiologically distended. Stomach/Bowel: Small hiatal hernia. Stomach incompletely distended, unremarkable. 1.7 cm lipoma intraluminal in the mid duodenum stable. Small bowel decompressed. Normal appendix. Colon is incompletely distended by gas and fecal material, with scattered descending and sigmoid diverticula; no adjacent inflammatory change. Lymphatic: No abdominal or pelvic adenopathy. Reproductive: Prostate enlargement. Penile pump components partially visualized. Other: No ascites.  No free air. Musculoskeletal: Multilevel lumbar spondylitic change. No fracture or worrisome bone lesion. Review of the MIP images confirms the above findings. IMPRESSION: 1. Negative for acute PE or thoracic aortic dissection. 2. Chronic small left pleural effusion. 3. 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 4. Stable 1.6 cm penetrating atheromatous ulcer in the descending thoracic aorta. 5. Patent infrarenal stent graft with persistent type 2 endoleak, stable 5 cm native sac diameter. 6. Small hiatal hernia. 7. Descending and sigmoid diverticulosis. Electronically Signed   By: Lucrezia Europe M.D.   On: 10/14/2022 16:19   DG Chest 2 View  Result Date: 10/14/2022 CLINICAL DATA:  Cough and shortness of breath. EXAM: CHEST - 2 VIEW COMPARISON:  Chest two views 02/26/2022,  CT chest 07/24/2022, CTA abdomen and pelvis 08/28/2022, CT chest 02/22/2022 FINDINGS: Left chest wall cardiac AICD with leads overlying the right atrium and right ventricle, similar to prior. Status post median sternotomy and CABG. Cardiac silhouette and mediastinal contours are within  normal limits with moderate calcification again seen within the aortic arch. The right lung appears clear. Note is made of small left pleural effusion and basilar atelectasis within the left lower lung on 07/24/2022 and 08/28/2022 CTs, not as well seen on the most recent 02/26/2022 chest radiograph. There is similar left retrocardiac and costophrenic angle opacification as on 02/26/2022 prior frontal chest radiograph, and there may be minimal left basilar atelectasis and pleural fluid remaining. The right lung is clear. No pneumothorax. Moderate multilevel degenerative disc changes of the thoracic spine. IMPRESSION: The prior small left pleural effusion and left basilar atelectasis seen on 02/22/2022, 07/24/2022, and 08/28/2022 CTs was not well visualized on prior 02/26/2022 radiographs. The appearance is similar on the current study, and there is again mild left retrocardiac and costophrenic angle opacification. This likely represents mild chronic left basilar atelectasis and pleural fluid. Electronically Signed   By: Yvonne Kendall M.D.   On: 10/14/2022 11:01   Disposition   Patient is being discharged home today in good condition.  Follow-up Plans & Appointments     Follow-up Information     Larey Dresser, MD Follow up.   Specialty: Cardiology Why: Please keep follow-up visit with Dr. Aundra Dubin which is scheduled for 10/30/2022 at 9:20am. Contact information: [place] [address] [phone]                Discharge Instructions     Diet - low sodium heart healthy   Complete by: As directed    Increase activity slowly   Complete by: As directed         Discharge Medications    Allergies as of 10/16/2022       Reactions   Ancef [cefazolin Sodium] Hives   Empagliflozin Other (See Comments)   Dizziness   Vancomycin Rash        Medication List     STOP taking these medications    oxyCODONE-acetaminophen 5-325 MG tablet Commonly known as: PERCOCET/ROXICET       TAKE these medications    apixaban 5 MG Tabs tablet Commonly known as: ELIQUIS Take 1 tablet (5 mg total) by mouth 2 (two) times daily. Restart after 5 days on 6/12   atorvastatin 80 MG tablet Commonly known as: LIPITOR Take 1 tablet (80 mg total) by mouth daily.   cetirizine 10 MG tablet Commonly known as: ZYRTEC Take 10 mg by mouth daily.   finasteride 5 MG tablet Commonly known as: PROSCAR Take 5 mg by mouth daily.   furosemide 20 MG tablet Commonly known as: LASIX Take 1 tablet (20 mg total) by mouth daily.   isosorbide mononitrate 30 MG 24 hr tablet Commonly known as: IMDUR Take 0.5 tablets (15 mg total) by mouth daily.   levothyroxine 50 MCG tablet Commonly known as: SYNTHROID Take 50 mcg by mouth at bedtime.   metoprolol succinate 25 MG 24 hr tablet Commonly known as: Toprol XL Take 1 tablet (25 mg total) by mouth daily.   mexiletine 150 MG capsule Commonly known as: MEXITIL Take 1 capsule (150 mg total) by mouth 2 (two) times daily.   midodrine 2.5 MG tablet Commonly known as: PROAMATINE Take 1 tablet (2.5 mg total) by mouth 2 (two) times daily with a meal.   multivitamin with minerals Tabs tablet Take 1 tablet by mouth in the morning.   pantoprazole 40 MG tablet Commonly known as: PROTONIX Take 40 mg by mouth daily before breakfast.   spironolactone 25 MG tablet Commonly  known as: ALDACTONE Take 0.5 tablets (12.5 mg total) by mouth at bedtime. What changed: how much to take   tamsulosin 0.4 MG Caps capsule Commonly known as: FLOMAX Take 0.4 mg by mouth every evening.   traMADol 50 MG tablet Commonly known as: ULTRAM Take 50 mg by mouth every 6  (six) hours as needed for moderate pain.   Vitamin D-3 25 MCG (1000 UT) Caps Take 1,000 Units by mouth in the morning.           Outstanding Labs/Studies   Follow-up on Echo.  Duration of Discharge Encounter   Greater than 30 minutes including physician time.  Signed, Darreld Mclean, PA-C 10/16/2022, 8:26 AM  As above, patient seen and examined.  Brief chest pain last night.  Denies dyspnea this morning.  Cardiac catheterization results noted.  Plan medical therapy for coronary artery disease.  Right heart filling pressures were normal.  CTA at time of admission did not show pulmonary embolus.  Will plan to resume all preadmission medications but decrease spironolactone to 12.5 mg daily.  Plan outpatient echocardiogram to reassess aortic stenosis (mild to moderate June 2023).  Patient can be discharged from a cardiac standpoint and follow-up in CHF clinic as already scheduled.  > 30 minutes PA and physician time. [place], PA-C

## 2022-10-16 NOTE — Progress Notes (Signed)
Ordered outpatient Echo for further evaluation of aortic stenosis. Please see discharge summary from today for more information.  Darreld Mclean, PA-C 10/16/2022 8:16 AM

## 2022-10-17 LAB — LIPOPROTEIN A (LPA): Lipoprotein (a): 19.6 nmol/L (ref <75.0)

## 2022-10-30 ENCOUNTER — Ambulatory Visit (HOSPITAL_BASED_OUTPATIENT_CLINIC_OR_DEPARTMENT_OTHER)
Admission: RE | Admit: 2022-10-30 | Discharge: 2022-10-30 | Disposition: A | Discharge status: Home / Self Care | Payer: Medicare Other | Source: Ambulatory Visit | Attending: Student | Admitting: Student

## 2022-10-30 ENCOUNTER — Encounter (HOSPITAL_COMMUNITY): Payer: Self-pay | Admitting: Cardiology

## 2022-10-30 ENCOUNTER — Ambulatory Visit (HOSPITAL_COMMUNITY)
Admission: RE | Admit: 2022-10-30 | Discharge: 2022-10-30 | Disposition: A | Discharge status: Home / Self Care | Payer: Medicare Other | Source: Ambulatory Visit | Attending: Cardiology | Admitting: Cardiology

## 2022-10-30 VITALS — BP 110/70 | HR 68 | Wt 202.2 lb

## 2022-10-30 DIAGNOSIS — N183 Chronic kidney disease, stage 3 unspecified: Secondary | ICD-10-CM | POA: Insufficient documentation

## 2022-10-30 DIAGNOSIS — I252 Old myocardial infarction: Secondary | ICD-10-CM | POA: Insufficient documentation

## 2022-10-30 DIAGNOSIS — E785 Hyperlipidemia, unspecified: Secondary | ICD-10-CM | POA: Diagnosis not present

## 2022-10-30 DIAGNOSIS — Z7901 Long term (current) use of anticoagulants: Secondary | ICD-10-CM | POA: Insufficient documentation

## 2022-10-30 DIAGNOSIS — I2510 Atherosclerotic heart disease of native coronary artery without angina pectoris: Secondary | ICD-10-CM | POA: Insufficient documentation

## 2022-10-30 DIAGNOSIS — I480 Paroxysmal atrial fibrillation: Secondary | ICD-10-CM | POA: Insufficient documentation

## 2022-10-30 DIAGNOSIS — I5022 Chronic systolic (congestive) heart failure: Secondary | ICD-10-CM

## 2022-10-30 DIAGNOSIS — I452 Bifascicular block: Secondary | ICD-10-CM | POA: Insufficient documentation

## 2022-10-30 DIAGNOSIS — I495 Sick sinus syndrome: Secondary | ICD-10-CM | POA: Insufficient documentation

## 2022-10-30 DIAGNOSIS — I951 Orthostatic hypotension: Secondary | ICD-10-CM | POA: Diagnosis not present

## 2022-10-30 DIAGNOSIS — Z79899 Other long term (current) drug therapy: Secondary | ICD-10-CM | POA: Insufficient documentation

## 2022-10-30 DIAGNOSIS — I714 Abdominal aortic aneurysm, without rupture, unspecified: Secondary | ICD-10-CM | POA: Insufficient documentation

## 2022-10-30 DIAGNOSIS — I25810 Atherosclerosis of coronary artery bypass graft(s) without angina pectoris: Secondary | ICD-10-CM | POA: Insufficient documentation

## 2022-10-30 DIAGNOSIS — I080 Rheumatic disorders of both mitral and aortic valves: Secondary | ICD-10-CM | POA: Diagnosis not present

## 2022-10-30 DIAGNOSIS — I350 Nonrheumatic aortic (valve) stenosis: Secondary | ICD-10-CM | POA: Diagnosis not present

## 2022-10-30 DIAGNOSIS — R531 Weakness: Secondary | ICD-10-CM | POA: Diagnosis not present

## 2022-10-30 DIAGNOSIS — I253 Aneurysm of heart: Secondary | ICD-10-CM | POA: Insufficient documentation

## 2022-10-30 DIAGNOSIS — R110 Nausea: Secondary | ICD-10-CM | POA: Insufficient documentation

## 2022-10-30 DIAGNOSIS — Z8679 Personal history of other diseases of the circulatory system: Secondary | ICD-10-CM | POA: Insufficient documentation

## 2022-10-30 LAB — CBC
HCT: 43.8 % (ref 39.0–52.0)
Hemoglobin: 13.8 g/dL (ref 13.0–17.0)
MCH: 30.7 pg (ref 26.0–34.0)
MCHC: 31.5 g/dL (ref 30.0–36.0)
MCV: 97.6 fL (ref 80.0–100.0)
Platelets: 186 10*3/uL (ref 150–400)
RBC: 4.49 MIL/uL (ref 4.22–5.81)
RDW: 14.6 % (ref 11.5–15.5)
WBC: 7.7 10*3/uL (ref 4.0–10.5)
nRBC: 0 % (ref 0.0–0.2)

## 2022-10-30 LAB — ECHOCARDIOGRAM COMPLETE
AR max vel: 1.1 cm2
AV Area VTI: 1.13 cm2
AV Area mean vel: 1.05 cm2
AV Mean grad: 11 mmHg
AV Peak grad: 19 mmHg
Ao pk vel: 2.18 m/s
Area-P 1/2: 5.66 cm2
Calc EF: 53.6 %
S' Lateral: 4.2 cm
Single Plane A2C EF: 57.4 %
Single Plane A4C EF: 47.5 %

## 2022-10-30 LAB — BASIC METABOLIC PANEL
Anion gap: 7 (ref 5–15)
BUN: 30 mg/dL — ABNORMAL HIGH (ref 8–23)
CO2: 28 mmol/L (ref 22–32)
Calcium: 9.3 mg/dL (ref 8.9–10.3)
Chloride: 104 mmol/L (ref 98–111)
Creatinine, Ser: 1.53 mg/dL — ABNORMAL HIGH (ref 0.61–1.24)
GFR, Estimated: 43 mL/min — ABNORMAL LOW (ref >60)
Glucose, Bld: 93 mg/dL (ref 70–99)
Potassium: 4.5 mmol/L (ref 3.5–5.1)
Sodium: 139 mmol/L (ref 135–145)

## 2022-10-30 LAB — BRAIN NATRIURETIC PEPTIDE: B Natriuretic Peptide: 214.5 pg/mL — ABNORMAL HIGH (ref 0.0–100.0)

## 2022-10-30 LAB — LIPID PANEL
Cholesterol: 108 mg/dL (ref 0–200)
HDL: 41 mg/dL
LDL Cholesterol: 53 mg/dL (ref 0–99)
Total CHOL/HDL Ratio: 2.6 ratio
Triglycerides: 69 mg/dL
VLDL: 14 mg/dL (ref 0–40)

## 2022-10-30 NOTE — Patient Instructions (Signed)
There has been no changes to your medications.  Labs done today, your results will be available in MyChart, we will contact you for abnormal readings.  You have been referred to the [facility] you will be called to have to appointment set up.  PLEASE FOLLOW UP WITH DR. Quentin Ore SOON.  Your physician recommends that you schedule a follow-up appointment in: 3 months.  If you have any questions or concerns before your next appointment please send [place] a message through [place] or call our office at [phone].    TO LEAVE A MESSAGE FOR THE NURSE SELECT OPTION 2, PLEASE LEAVE A MESSAGE INCLUDING: YOUR NAME DATE OF BIRTH CALL BACK NUMBER REASON FOR CALL**this is important as we prioritize the call backs  YOU WILL RECEIVE A CALL BACK THE SAME DAY AS LONG AS YOU CALL BEFORE 4:00 PM  At the [facility], you and your health needs are our priority. As part of our continuing mission to provide you with exceptional heart care, we have created designated Provider Care Teams. These Care Teams include your primary Cardiologist (physician) and Advanced Practice Providers (APPs- Physician Assistants and Nurse Practitioners) who all work together to provide you with the care you need, when you need it.   You may see any of the following providers on your designated Care Team at your next follow up: Dr Glori Bickers Dr [name] Dr. Roxana Hires, NP Lyda Jester, [place] [facility] [place], [place] Forestine Na, NP Audry Riles, PharmD   Please be sure to bring in all your medications bottles to every appointment.    Thank you for choosing [facility]

## 2022-10-30 NOTE — Progress Notes (Signed)
Advanced Heart Failure Clinic Note  Date:  10/30/2022   ID:  Eric Gordon, DOB August 11, 1931, MRN [id number]   Provider location: [place] [facility] Failure Type of Visit: Established patient  PCP:  Shon Baton, MD  EP: Dr. Quentin Ore HF Cardiologist:  Dr. Aundra Dubin   HPI: Eric Gordon is a 89 y.o. male with history of CAD s/p CABG.  He has had trouble long-term with exertional dyspnea.  Dyspnea triggered evaluation in 2012 leading to CABG but was not resolved by CABG.  PFTs in 3/14 showed only mild obstructive airways disease and V/Q scan showed no PE.  Echo in 9/14 showed normal LV systolic function with moderately dilated RV. [place] in 2/15 showed normal right and left heart filling pressures and normal PA pressure.  Finally, he had a CPX in 3/15 that showed normal capacity compared to age-matched sedentary norms.  He was noted to have chronotropic incompetence.  At a prior appointment, I took him off metoprolol given chronotropic incompetence noted on CPX.  Dyspnea improved significantly with weight loss.  He had Cardiolite in 8/18 with EF 52%, no ischemia or infarction.    He was admitted in 1/20 with NSTEMI.  LHC showed new occlusion of the branch of SVG-ramus and OM that touched down on OM.  There were also serial 70% stenoses in the mid/distal RCA.  There was not thought to be an interventional option and patient was treated medically. He was discharged home but presented a couple days later with recurrent chest pain and dyspnea.  Cath was repeated showing no change from prior.  This admission, he had FFR of the RCA which did not suggest hemodynamic significance.  Echo showed EF 55-60%, normal RV.  CTA did not show a PE. He was noted to be volume overloaded and was diuresed.  He was also noted to be in atrial fibrillation with RVR transiently.  ASA/Plavix was stopped and Eliquis was started. He was discharged to SNF.  Atrial fibrillation recurred and he was started on amiodarone  with conversion back to NSR.   In 4/20, he was admitted with left spontaneous PTX requiring chest tube.  After this, he developed recurrent left pleural effusions requiring thoracentesis x 3 so far.  Pleural fluid was exudative with eosinophil predominance.  With elevated ESR, there was concern that amiodarone could be involved, so this medication was stopped.  CT chest in 6/20 showed LLL consolidation/collapse with small left effusion, there was a rim-enhancing lesion posterior to the heart between esophagus and left atrium, ?loculated fluid.  TEE was done in 7/20 and confirmed loculated pleural effusion behind the left atrium.  In 7/20, patient had VATS on the left.   Carotid dopplers in 9/21 showed 80-99% RICA stenosis, 00-86% LICA.  AAA [place] in 9/21 showed 4.5 cm AAA. Patient was referred to Dr. Trula Slade for evaluation => he has had TCAR on right in 12/21.  He had left TCAR in 5/22.   Cardiolite in 12/21 with EF 41%, no ischemia, fixed inferolateral defect. Echo in 1/22 showed EF 40-45%, basal to mid inferolateral AK, basal inferior HK, normal RV, mild aortic stenosis mean gradient 11 mmHg.  He was unable to tolerate dapagliflozin and is off it. He was lightheaded on low dose Entresto so this was stopped.   Zio monitor in 1/23 showed 25% atrial flutter.  He was referred to EP, by the time he saw EP, burden of atrial fibrillation was lower.   Cardiolite in 3/23 showed  prior inferolateral and inferior infarct, no ischemia.   Patient was admitted in 4/23 with atypical chest pain.  This likely was due to cough/bronchitis.  CTA chest showed inferolateral wall pseudoaneurysm, this is chronic.  Echo in 4/23 showed EF 40-45%, akinesis of the inferolateral wall, basal inferolateral pseudoaneurysm, moderate RV enlargement, normal RV systolic function, mild AS with mean gradient 10 mmHg.   Zio monitor in 5/23 showed 2% AF burden, improved from prior.     Follow up 5/23, stable NYHA II, euvolemic. Off several  meds due to orthostatic symptoms.  Admitted 6/23 with syncope, no evidence for ACS. 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 EP study with inducible VT. Underwent dual chamber Biotronik ICD placement. Discharged home, weight 208 lbs.  VT again in 9/23, treated with ATP.  Mexiletine recommended by EP.   Follow up 10/23, NYHA III and volume stable. GDMT limited by orthostatic symptoms and need for midodrine. Mexiletine started for recurrent VT.  S/p aortobi-iliac endograft repair of infrarenal abdominal aortic aneurysm 12/23. Type 2 endoleak noted on CTA in 1/24.   Patient was admitted in 1/24 with weakness, dyspnea, cough, atypical chest pain.  CTA chest showed not PE.  He ended up having cardiac cath, this showed no significant change to his coronary anatomy from the past.  Filling pressures were not elevated on RHC.  He was discharged home.   Echo in 2/24 showed EF 45-50%, basal-mid inferior/inferolateral akinesis, low normal RV function, moderate AS with mean gradient 11 and AVA 1.13 cm^2.   He returns for followup of CHF and CAD.  He still feels weak and has occasional nausea.  He continues to have episodes of lightheadedness with standing and remains on midodrine. He has bendopnea.  He gets short of breath dressing, showering, and walking 100-200 feet.  He has no chest pain.  He feels like his weakness, dyspnea, and nausea may be related to mexiletine.   ECG (personally reviewed): a-paced, RBBB with QTc 478 msec   Labs (8/18): LDL 82, HDL 41, TSH normal, hgb 16.3, K 4.1, creatinine 1.05 Labs (2/20): LFTs normal, pro BNP 842 Labs (3/20): K 3.8, creatinine 1.48, TSH mildly elevated at 6.97, free T3 and free T4 normal Labs (4/20): K 3.9, creatinine 1.36 Labs (2/20): LDL 52 Labs (8/20): BNP 241, creatinine 1.21  Labs (9/20): BNP 244, K 4, creatinine 1.12 Labs (2/21): K 4.3, creatinine 1.26, BNP 271 Labs (6/21): K 4.2, creatinine 1.11, LDL  57 Labs (6/22): K 4.3, creatinine 1.16, LDL 49 Labs (8/22): K 4.3, creatinine 1.37 Labs (12/22): K 3.9, creatinine 1.18 Labs (4/23): K 4.7, creatinine 1.35, BNP 253 Labs (6/23): K 4.1, creatinine 0.95 => 1.28 Labs (10/23): K 4.4, creatinine 1.26 Labs (11/23): K 4.3, creatinine 1.28 Labs (1/24): K 4.2, creatinine 1.1, BNP 202, HS-TnI negative    PMH: 1. Essential tremor 2. CAD: s/p CABG in 3/12.   - Cardiolite (8/18): EF 52%, no ischemia/infarction.  - NSTEMI (1/20):  LHC showed new occlusion of the branch of SVG-ramus and OM that touched down on OM.  There were also serial 70% stenoses in the mid/distal RCA.  FFR of RCA was negative.  - Cardiolite (12/21): EF 41%, no ischemia, fixed inferolateral defect - Cardiolite (3/23): LV EF 37%, fixed basal-mid inferolateral/inferior defect, no ischemia.  - LHC (1/24): Totally occluded SVG-PDA, occlusion of the branch of sequential SVG-ramus and OM that touches down on OM but patent ramus branch, occluded SVG-D, patent LIMA-LAD. Similar to  prior, no intervention.  3. Atrial fibrillation: Only noted post-op CABG.  4. PE: Post-op CABG in 2012.  5. AAA: 3.6 cm on [place] in 3/14.  3.6 cm on [place] in 3/15. 3.6 cm on [place] 8/16. 4.1 cm on [place] 4/17.  - AAA [place] (5/18): 4.1 cm AAA.  - AAA [place] (5/19): 4.1 cm AAA.  - AAA [place] (4/20): 4.1 cm AAA.  - AAA [place] (9/21): 4.5 cm AAA - AAA [place] (9/22): 4.5 cm AAA - AAA [place] (10/23): 4.9 cm AAA - s/p EVAR 12/23, type 2 endoleak noted on 1/24 CTA.  6. OSA: On CPAP.  7. Carotid stenosis: Carotid dopplers (1/61) with 09-60% LICA stenosis.  Carotid dopplers (4/54) with 09-81% LICA stenosis. Carotid dopplers (3/16) with 60-79% RCIA stenosis, 19-14% LICA stenosis.  - Carotid dopplers (8/16) with 40-59% RICA stenosis, 78-29% LICA stenosis.  - Carotid dopplers (8/17) with 56-21% RICA, 30-86% LICA. - Carotid dopplers (9/18) with 57-84% RICA, 69-62% LICA.   - Carotid dopplers (9/19) with 95-28% RICA, 41-32% LICA.  - Carotid dopplers (9/20)  with 60-79% BICA - Carotid dopplers (9/21): 44-01% RICA, 02-72% LICA - Right TCAR 53/66, left TCAR 5/22.  - Carotid dopplers (4/23): patent ICA stents.  8. Asthma 9. PNA x 2 10. Chronic diastolic CHF/dyspnea: Echo (9/14) with EF 60-65%, mild LVH, very mild AS with mean gradient 10 mmHg, RV moderately dilated.  PFTs (3/14) showed mild obstructive airways disease.  V/Q scan (3/14) with no PE.  ENT workup for upper respiratory causes was negative.  RHC (2/15) with mean RA 7, PA 32/12, mean PCWP 13, CI 3.18.  CPX (3/15) with peak VO2 16.4, VE/VCO2 33; normal when compared to age-matched sedentary normals; chronotropic incompetence was noted.  Dyspnea was improved with weight loss.  - Echo (8/17): EF 60-65%, mild AS.  - Echo (1/20): EF 55-60%, normal RV size and systolic function.  - Echo (6/20): EF 55%, mild LVH, mild AS, normal RV size and systolic function, ?LA mass or mass impinging on posterior LA.  - TEE (7/20): EF 50-55%, mild LVH, basal inferolateral aneurysm, normal RV size/systolic function, loculated pleural fluid behind LA.  - Echo (1/22): EF 40-45%, basal to mid inferolateral AK, basal inferior HK, normal RV, mild aortic stenosis mean gradient 11 mmHg. - Echo (2/23): EF 45-50%, moderate LVH, normal RV, mild AS mean gradient 15 mmHg with IVC normal.  - Echo (4/23): EF 40-45%, akinesis of the inferolateral wall, basal inferolateral pseudoaneurysm, moderate RV enlargement, normal RV systolic function, mild AS with mean gradient 10 mmHg.  - Echo (6/23): EF 40-45% and pre-existing aneurysm of the basal inferolateral wall, mild-mod AS. - RHC (1/24): mean RA 1, PA 19/10, mean PCWP 5, CI 2.66.  - Echo (2/24): EF 45-50%, basal-mid inferior/inferolateral akinesis, low normal RV function, moderate AS with mean gradient 11 and AVA 1.13 cm^2. 11. HTN 12. Aortic stenosis: Moderate on 2/24 echo.   13. Ascending aortic aneurysm: CTA chest with 4.4 cm ascending aorta in 1/20.  - 4.2 cm ascending aorta on  CT chest 6/20.  14. Atrial fibrillation: Paroxysmal.  15. CKD: Stage 3.  16. Left lung spontaneous PTX then recurrent left pleural effusion.  - Left VATS in 7/20.  17. Palpitations:  - Zio patch (6/20): NSR with 1 short NSVT run and few short SVT runs, no atrial fibrillation.  - Zio patch 2 wks (2/21): 6 short NSVT runs, 66 short SVT runs, rare PACs/PVCs.  - Zio patch (5/23): 2% AF burden, 4 NSVT runs  longest 5 beats.  89. COVID-19 infection 4/22.  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  20. VT: Syncope in 6/23 with inducible VT. s/p dual chamber Biotronik ICD  Current Outpatient Medications  Medication Sig Dispense Refill   apixaban (ELIQUIS) 5 MG TABS tablet Take 1 tablet (5 mg total) by mouth 2 (two) times daily. 60 tablet    atorvastatin (LIPITOR) 80 MG tablet Take 1 tablet (80 mg total) by mouth daily. 30 tablet 6   cetirizine (ZYRTEC) 10 MG tablet Take 10 mg by mouth daily.     Cholecalciferol (VITAMIN D-3) 25 MCG (1000 UT) CAPS Take 1,000 Units by mouth in the morning.     finasteride (PROSCAR) 5 MG tablet Take 5 mg by mouth daily.     furosemide (LASIX) 20 MG tablet Take 1 tablet (20 mg total) by mouth daily. 90 tablet 3   isosorbide mononitrate (IMDUR) 30 MG 24 hr tablet Take 0.5 tablets (15 mg total) by mouth daily.     levothyroxine (SYNTHROID) 50 MCG tablet Take 50 mcg by mouth at bedtime.     metoprolol succinate (TOPROL XL) 25 MG 24 hr tablet Take 1 tablet (25 mg total) by mouth daily.     mexiletine (MEXITIL) 150 MG capsule Take 1 capsule (150 mg total) by mouth 2 (two) times daily. 180 capsule 2   midodrine (PROAMATINE) 2.5 MG tablet Take 1 tablet (2.5 mg total) by mouth 2 (two) times daily with a meal. 180 tablet 3   Multiple Vitamin (MULTIVITAMIN WITH MINERALS) TABS tablet Take 1 tablet by mouth in the morning.     pantoprazole (PROTONIX) 40 MG tablet Take 40 mg by mouth daily before breakfast.     spironolactone (ALDACTONE) 25 MG tablet Take 0.5 tablets (12.5 mg  total) by mouth at bedtime. 15 tablet 2   tamsulosin (FLOMAX) 0.4 MG CAPS capsule Take 0.4 mg by mouth every evening.     traMADol (ULTRAM) 50 MG tablet Take 50 mg by mouth every 6 (six) hours as needed for moderate pain.     No current facility-administered medications for this encounter.   Allergies:   Ancef [cefazolin sodium], Empagliflozin, and Vancomycin   Social History:  The patient  reports that he quit smoking about 38 years ago. His smoking use included cigarettes. He has a 35.00 pack-year smoking history. He has never used smokeless tobacco. He reports current alcohol use of about 2.0 standard drinks of alcohol per week. He reports that he does not use drugs.   Family History:  The patient's family history includes Coronary artery disease in his mother; Heart disease in his mother; Hypertension in his mother.   ROS:  Please see the history of present illness.   All other systems are personally reviewed and negative.   Recent Labs: 02/23/2022: TSH 2.442 08/06/2022: Magnesium 1.8 10/14/2022: ALT 17 10/30/2022: B Natriuretic Peptide 214.5; BUN 30; Creatinine, Ser 1.53; Hemoglobin 13.8; Platelets 186; Potassium 4.5; Sodium 139  Personally reviewed   BP 110/70   Pulse 68   Wt 91.7 kg (202 lb 3.2 oz)   SpO2 97%   BMI 25.27 kg/m   Wt Readings from Last 3 Encounters:  10/30/22 91.7 kg (202 lb 3.2 oz)  10/15/22 92.1 kg (203 lb)  10/14/22 92.1 kg (203 lb)    General: NAD Neck: No JVD, no thyromegaly or thyroid nodule.  Lungs: Clear to auscultation bilaterally with normal respiratory effort. CV: Nondisplaced PMI.  Heart regular S1/S2, no S3/S4, no murmur.  No peripheral edema.  No carotid bruit.  Normal pedal pulses.  Abdomen: Soft, nontender, no hepatosplenomegaly, no distention.  Skin: Intact without lesions or rashes.  Neurologic: Alert and oriented x 3.  Psych: Normal affect. Extremities: No clubbing or cyanosis.  HEENT: Normal.   Assessment & Plan: 1. CAD: s/p CABG 3/12.  NSTEMI 1/20, LHC showed new occlusion of the branch of sequential SVG-ramus and OM that touched down on OM.  There were also serial 70% stenoses in the mid/distal RCA.  FFR of RCA was negative.  No interventional target.  Cardiolite in 12/21 showed inferolateral infarction with no ischemia, consistent with findings on NSTEMI in 1/20. Cardiolite in 3/23 showed inferior/inferolateral fixed defect with no ischemia.  LHC in 1/24 showed unchanged coronary anatomy with occluded branch of sequential SVG-ramus and OM touching down on OM, patent LIMA-LAD, occluded SVG-D, occluded SVG-PDA; no intervention.  He has not had chest pain since leaving the hospital in 1/24.  - No ASA given Eliquis use.  - Continue atorvastatin 80 daily.  Check lipids today.  - Continue Imdur 15 mg daily.  - Continue Toprol XL 25 mg daily.  2. Syncope: 6/23, most likely cardiogenic. Known tachy-brady syndrome with outpatient monitor showing post-conversion (AF => NSR) symptomatic pauses, up to 3 sec long. Has bifascicular block.  EP study in 6/23 w/ inducible VT. Biotronik ICD placed.  - No further syncope. 3. Hyperlipidemia: Continue statin, check lipids today. 4. Chronic HF with mid range EF: TEE in 7/20 showed EF 50-55%, basal inferolateral aneurysm, and normal RV.  Echo in 1/22 with EF 40-45%, basal-mid inferolateral akinesis and inferior hypokinesis, normal RV.  Echo in 6/23 showed EF 40-45% and pre-existing aneurysm of the basal inferolateral wall, mild-mod AS.  RHC in 1/24 with normal filling pressures and preserved cardiac output.  Echo in 2/24 showed EF 45-50%, basal-mid inferior/inferolateral akinesis, low normal RV function, moderate AS with mean gradient 11 and AVA 1.13 cm^2.  NYHA class III symptoms but not volume overloaded on exam or by recent RHC. GDMT has been very limited by orthostatic symptoms. I suspect deconditioning and lung disease (recurrent left effusion and h/o PTX with likely scarring) play a role in his dyspnea.   - Continue Toprol XL 25 mg daily.  - Continue Lasix 20 mg daily. BMET/BNP today. - Continue spironolactone 12.5 mg qhs. - Continue low dose midodrine for now with orthostatic symptoms.  - Off ARB due to orthostatic hypotension. - Unable to tolerate dapaglifozin. - He needs to wear graded compression stockings during the day.  - Needs exercise, refer to [facility] PREP program.  5. Spontaneous left PTX with recurrent left-sided pleural effusion:  He is s/p left VATS/pleurodesis in 7/20.    6. Carotid stenosis: S/p bilateral TCARs, followed at [facility].     7. AAA: 4.9 cm AAA in 10/23.  S/p EVAR 12/23.  Has type 2 endoleak by CTA in 1/24.  - Follow at [facility].  8. Aortic stenosis: Moderate by echo in 2/24.   - With his profound exertional symptoms, would consider him for trial of TAVR in moderate AS when/if this becomes an option at our center.  9. Atrial fibrillation: Paroxysmal. He is significantly symptomatic while in atrial fibrillation. He was taken off amiodarone due to concern for pulmonary toxicity.  He saw EP, did not recommend ablation. He would likely be a Tikosyn or sotalol candidate but would have to come into the hospital for it.  NSR (a-paced) today.  - Continue Eliquis 5 mg bid. CBC today. 10.  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  This has been stable.  11. VT: Recurrent, suspect scar in the inferolateral wall is nidus.  Has Biotronik ICD.  He had VT again in 9/23, not associated with chest pain/ischemic symptoms.  He was on amiodarone in the past but developed possible pulmonary toxicity.  Now on mexiletine, but he thinks that it is causing his weakness and nausea (has felt "terrible" since starting mexiletine).  - Continue Toprol XL 25 mg daily.  - Continue mexiletine 150 mg bid but will have him see Dr. Quentin Ore again to consider switching to sotalol.  His QTc looks acceptable for this but would have to come into hospital.  12. CKD stage 3: BMET today.    Followup 2 months with APP.   [name] 10/30/2022

## 2022-10-30 NOTE — Progress Notes (Signed)
Echocardiogram 2D Echocardiogram has been performed.  Fidel Levy 10/30/2022, 9:02 AM

## 2022-10-31 ENCOUNTER — Telehealth: Payer: Self-pay

## 2022-10-31 NOTE — Telephone Encounter (Signed)
Called re: PREP program, spoke with his wife, answered some of her questions, but asked her to let him call me back when he was available to explain program

## 2022-11-03 ENCOUNTER — Telehealth: Payer: Self-pay

## 2022-11-03 NOTE — Progress Notes (Unsigned)
Electrophysiology Office Follow up Visit Note:    Date:  11/03/2022   ID:  Eric Gordon, DOB 1930/10/09, MRN [id number]  PCP:  Eric Baton, MD  [facility] Cardiologist:  None  [facility] Electrophysiologist:  Vickie Epley, MD    Interval History:    Eric Gordon is a 89 y.o. male who presents for a follow up visit. They were last seen in clinic 08/05/2022. He has chronic systolic HF, VT with ICD. Was previously managed with mexiletine. Had off target effects with amiodarone. He has received therapies from his ICD. He saw Dr Aundra Dubin 10/30/2022. He reports nausea with mexiletine and is interested in changing medications.      Past Medical History:  Diagnosis Date   Abdominal aortic aneurysm (AAA) (HCC)    Achilles tendinitis of right lower extremity    AICD (automatic cardioverter/defibrillator) present    Arthritis    "my whole body" (03/19/2018)   Atherosclerosis of coronary artery bypass graft w/o angina pectoris    Atrial fibrillation (HCC)    Barrett's esophagus    CAD (coronary artery disease)    Carotid artery disease (HCC)    CHF (congestive heart failure) (HCC)    Chronic anticoagulation    PE   Chronic lower back pain    Dyspnea    Dysrhythmia    Essential tremor    GERD (gastroesophageal reflux disease)    High cholesterol    Hyperlipidemia    Hyperplasia, prostate    Hypertension    Hypothyroidism    Myocardial infarction ([place])    2 in Jan. 2019   Nail dystrophy    OSA on CPAP    uses CPAP   Osteoarthrosis    Pneumonia    "now and once before" (03/19/2018)   Pulmonary embolism [facility])    April 2012 after CABG   S/P CABG (coronary artery bypass graft)    Sleep apnea    Spontaneous pneumothorax    left spontaneous pneumothorax/left pleural effusion, s/p chest tube 01/09/19; thoracentesis x2, s/p left VATS/tacl pleurodesis 04/12/19    Past Surgical History:  Procedure Laterality Date   ABDOMINAL AORTIC ENDOVASCULAR STENT GRAFT N/A  08/06/2022   Procedure: ABDOMINAL AORTIC ENDOVASCULAR STENT GRAFT;  Surgeon: Serafina Mitchell, MD;  Location: [place];  Service: Vascular;  Laterality: N/A;   ACHILLES TENDON REPAIR Bilateral    2006 & 2004   CARDIAC CATHETERIZATION  11/2010   CORONARY ANGIOGRAPHY N/A 10/22/2018   Procedure: CORONARY ANGIOGRAPHY;  Surgeon: Troy Sine, MD;  Location: [place] CV LAB;  Service: Cardiovascular;  Laterality: N/A;   CORONARY ARTERY BYPASS GRAFT  March 2012   CABG X 5   ESOPHAGOGASTRODUODENOSCOPY (EGD) WITH PROPOFOL N/A 12/10/2020   Procedure: ESOPHAGOGASTRODUODENOSCOPY (EGD) WITH PROPOFOL;  Surgeon: Doran Stabler, MD;  Location: [facility] ENDOSCOPY;  Service: Gastroenterology;  Laterality: N/A;   ICD IMPLANT N/A 02/25/2022   Procedure: ICD IMPLANT;  Surgeon: Vickie Epley, MD;  Location: [place] CV LAB;  Service: Cardiovascular;  Laterality: N/A;   INGUINAL HERNIA REPAIR Left 1980   INTRAVASCULAR PRESSURE WIRE/FFR STUDY N/A 10/22/2018   Procedure: INTRAVASCULAR PRESSURE WIRE/FFR STUDY;  Surgeon: Troy Sine, MD;  Location: [place] CV LAB;  Service: Cardiovascular;  Laterality: N/A;   IR THORACENTESIS ASP PLEURAL SPACE W/IMG GUIDE  02/04/2019   IR THORACENTESIS ASP PLEURAL SPACE W/IMG GUIDE  02/24/2019   KNEE ARTHROSCOPY Left 2006   LEFT HEART CATH AND CORS/GRAFTS ANGIOGRAPHY N/A 10/18/2018  Procedure: LEFT HEART CATH AND CORS/GRAFTS ANGIOGRAPHY;  Surgeon: Lorretta Harp, MD;  Location: [place] CV LAB;  Service: Cardiovascular;  Laterality: N/A;   LEFT HEART CATH AND CORS/GRAFTS ANGIOGRAPHY N/A 10/21/2018   Procedure: LEFT HEART CATH AND CORS/GRAFTS ANGIOGRAPHY;  Surgeon: Troy Sine, MD;  Location: [place] CV LAB;  Service: Cardiovascular;  Laterality: N/A;   PACEMAKER IMPLANT N/A 02/24/2022   Procedure: PACEMAKER IMPLANT;  Surgeon: Deboraha Sprang, MD;  Location: [place] CV LAB;  Service: Cardiovascular;  Laterality: N/A;   PENILE PROSTHESIS IMPLANT     PLEURAL  BIOPSY Left 04/12/2019   Procedure: Pleural Biopsy;  Surgeon: Grace Isaac, MD;  Location: [place];  Service: Thoracic;  Laterality: Left;   PLEURAL EFFUSION DRAINAGE Left 04/12/2019   Procedure: Drainage Of Pleural Effusion;  Surgeon: Grace Isaac, MD;  Location: [place];  Service: Thoracic;  Laterality: Left;   RIGHT HEART CATHETERIZATION N/A 11/14/2013   Procedure: RIGHT HEART CATH;  Surgeon: Larey Dresser, MD;  Location: [facility] CATH LAB;  Service: Cardiovascular;  Laterality: N/A;   RIGHT/LEFT HEART CATH AND CORONARY/GRAFT ANGIOGRAPHY N/A 10/15/2022   Procedure: RIGHT/LEFT HEART CATH AND CORONARY/GRAFT ANGIOGRAPHY;  Surgeon: Burnell Blanks, MD;  Location: [place] CV LAB;  Service: Cardiovascular;  Laterality: N/A;   SHOULDER OPEN ROTATOR CUFF REPAIR Right 2003   TALC PLEURODESIS Left 04/12/2019   Procedure: Pietro Cassis;  Surgeon: Grace Isaac, MD;  Location: [place];  Service: Thoracic;  Laterality: Left;   TEE WITHOUT CARDIOVERSION N/A 03/24/2019   Procedure: TRANSESOPHAGEAL ECHOCARDIOGRAM (TEE);  Surgeon: Larey Dresser, MD;  Location: [facility] ENDOSCOPY;  Service: Cardiovascular;  Laterality: N/A;   TEE WITHOUT CARDIOVERSION  04/12/2019   Procedure: Transesophageal Echocardiogram (Tee);  Surgeon: Grace Isaac, MD;  Location: [facility] OR;  Service: Thoracic;;   THORACOSCOPY Left 04/12/2019   VIDEO BRONCHOSCOPY (N/A ) VIDEO ASSISTED THORACOSCOPY (Left Chest) TALC PLEURADESIS (Left    TRANSCAROTID ARTERY REVASCULARIZATION  Right 09/13/2020   Procedure: RIGHT TRANSCAROTID ARTERY REVASCULARIZATION USING 50m X 441mENROUTE TRANSCAROTID STEND SYSTEM;  Surgeon: BrSerafina MitchellMD;  [place] Service: Vascular;  Laterality: Right;   TRANSCAROTID ARTERY REVASCULARIZATION  Left 02/08/2021   Procedure: LEFT TRANSCAROTID ARTERY REVASCULARIZATION;  Surgeon: BrSerafina MitchellMD;  [place] [facility] Service: Vascular;  Laterality: Left;   ULTRASOUND GUIDANCE FOR VASCULAR ACCESS   09/13/2020   Procedure: ULTRASOUND GUIDANCE FOR VASCULAR ACCESS;  Surgeon: BrSerafina MitchellMD;  [place] [facility] Service: Vascular;;   ULTRASOUND GUIDANCE FOR VASCULAR ACCESS Bilateral 08/06/2022   Procedure: ULTRASOUND GUIDANCE FOR VASCULAR ACCESS;  Surgeon: BrSerafina MitchellMD;  [place] [facility] Service: Vascular;  Laterality: Bilateral;   VIDEO ASSISTED THORACOSCOPY Left 04/12/2019   Procedure: VIDEO ASSISTED THORACOSCOPY;  Surgeon: GeGrace IsaacMD;  [facility] Service: Thoracic;  Laterality: Left;   VIDEO BRONCHOSCOPY N/A 04/12/2019   Procedure: VIDEO BRONCHOSCOPY;  Surgeon: GeGrace IsaacMD;  [facility];  Service: Thoracic;  Laterality: N/A;    Current Medications: No outpatient medications have been marked as taking for the 11/04/22 encounter (Appointment) with LaVickie EpleyMD.     Allergies:   Ancef [cefazolin sodium], Empagliflozin, and Vancomycin   Social History   Socioeconomic History   Marital status: Married    Spouse name: Not on file   Number of children: 2   Years of education: Not on file   Highest education level:  Not on file  Occupational History   Occupation: Scientist, clinical (histocompatibility and immunogenetics): [employer]  Tobacco Use   Smoking status: Former    Packs/day: 1.00    Years: 35.00    Total pack years: 35.00    Types: Cigarettes    Quit date: 02/18/1984    Years since quitting: 38.7   Smokeless tobacco: Never  Vaping Use   Vaping Use: Never used  Substance and Sexual Activity   Alcohol use: Yes    Alcohol/week: 2.0 standard drinks of alcohol    Types: 2 Standard drinks or equivalent per week   Drug use: Never   Sexual activity: Not on file  Other Topics Concern   Not on file  Social History Narrative   Not on file   Social Determinants of Health   Financial Resource Strain: Not on file  Food Insecurity: No Food Insecurity (10/15/2022)   Hunger Vital Sign    Worried About Running Out of Food in the Last Year: Never true    Ran Out of  Food in the Last Year: Never true  Transportation Needs: No Transportation Needs (10/15/2022)   PRAPARE - Hydrologist (Medical): No    Lack of Transportation (Non-Medical): No  Physical Activity: Not on file  Stress: Not on file  Social Connections: Not on file     Family History: The patient's family history includes Coronary artery disease in his mother; Heart disease in his mother; Hypertension in his mother.  ROS:   Please see the history of present illness.    All other systems reviewed and are negative.  EKGs/Labs/Other Studies Reviewed:    The following studies were reviewed today:  11/04/2022 in clinic device interrogation personally reviewed ***  EKG:  The ekg ordered today demonstrates ***  Recent Labs: 02/23/2022: TSH 2.442 08/06/2022: Magnesium 1.8 10/14/2022: ALT 17 10/30/2022: B Natriuretic Peptide 214.5; BUN 30; Creatinine, Ser 1.53; Hemoglobin 13.8; Platelets 186; Potassium 4.5; Sodium 139  Recent Lipid Panel    Component Value Date/Time   CHOL 108 10/30/2022 1003   TRIG 69 10/30/2022 1003   HDL 41 10/30/2022 1003   CHOLHDL 2.6 10/30/2022 1003   VLDL 14 10/30/2022 1003   LDLCALC 53 10/30/2022 1003    Physical Exam:    VS:  There were no vitals taken for this visit.    Wt Readings from Last 3 Encounters:  10/30/22 202 lb 3.2 oz (91.7 kg)  10/15/22 203 lb (92.1 kg)  10/14/22 203 lb (92.1 kg)     GEN: *** Well nourished, well developed in no acute distress CARDIAC: ***RRR, no murmurs, rubs, gallops. ICD well healed.        ASSESSMENT:    1. Chronic systolic heart failure (HCC)   2. Paroxysmal atrial fibrillation ([place])   3. Ventricular tachycardia ([place])   4. Implantable cardioverter-defibrillator (ICD) in situ    PLAN:    In order of problems listed above:  #Chronic systolic heart failure NYHA II. Warm and dry. Follows with Dr Aundra Dubin. Last EF 45%. Continue current medical therapy.  ***stop BB/reduce before  sotalol admission  #VT #ICD in situ VT hx with appropriate therapies in the past. Failed mexiletine. Prior amio tox. Plan for sotalol admission.  Sotalol admission/therapy discussed in detail with the patient including the need for inpatinet hospitalization and risks associated with sotalol use. Will plan to get this set up for him.       Medication Adjustments/Labs and Tests Ordered: Current  medicines are reviewed at length with the patient today.  Concerns regarding medicines are outlined above.  No orders of the defined types were placed in this encounter.  No orders of the defined types were placed in this encounter.    Signed, Lars Mage, MD, [facility], [facility] 11/03/2022 1:19 PM    Electrophysiology [facility]

## 2022-11-03 NOTE — Telephone Encounter (Signed)
Returned his call, explained PREP, he would like to attend next class at [place] on March 12, every T/Th 10-11:15; will contact him end of February to set up assessment visit.

## 2022-11-04 ENCOUNTER — Ambulatory Visit: Payer: No Typology Code available for payment source | Attending: Cardiology | Admitting: Cardiology

## 2022-11-04 ENCOUNTER — Encounter: Payer: Self-pay | Admitting: Cardiology

## 2022-11-04 VITALS — BP 140/70 | HR 70 | Ht 75.0 in | Wt 204.0 lb

## 2022-11-04 DIAGNOSIS — I480 Paroxysmal atrial fibrillation: Secondary | ICD-10-CM

## 2022-11-04 DIAGNOSIS — I5022 Chronic systolic (congestive) heart failure: Secondary | ICD-10-CM

## 2022-11-04 DIAGNOSIS — Z95810 Presence of automatic (implantable) cardiac defibrillator: Secondary | ICD-10-CM | POA: Diagnosis not present

## 2022-11-04 DIAGNOSIS — I472 Ventricular tachycardia, unspecified: Secondary | ICD-10-CM

## 2022-11-04 NOTE — Patient Instructions (Signed)
Medication Instructions:  Your physician recommends that you continue on your current medications as directed. Please refer to the Current Medication list given to you today.  *If you need a refill on your cardiac medications before your next appointment, please call your pharmacy*  Follow-Up: At [facility], you and your health needs are our priority.  As part of our continuing mission to provide you with exceptional heart care, we have created designated Provider Care Teams.  These Care Teams include your primary Cardiologist (physician) and Advanced Practice Providers (APPs -  Physician Assistants and Nurse Practitioners) who all work together to provide you with the care you need, when you need it.  Your next appointment:   6 month(s)  Provider:   You will see one of the following Advanced Practice Providers on your designated Care Team:   Renee Ursuy, PA-C Michael "Andy" Tillery, PA-C Suzann Riddle, NP    

## 2022-11-04 NOTE — Progress Notes (Signed)
Electrophysiology Office Follow up Visit Note:    Date:  11/04/2022   ID:  Eric Gordon, DOB [birth year], MRN [id number]  PCP:  Shon Baton, MD  [facility] Cardiologist:  None  [facility] Electrophysiologist:  Vickie Epley, MD    Interval History:    Eric Gordon is a 89 y.o. male who presents for a follow up visit. They were last seen in clinic 08/05/2022. He has chronic systolic HF, VT with ICD. Was previously managed with mexiletine. Had off target effects with amiodarone. He has received therapies from his ICD. He saw Dr Aundra Dubin 10/30/2022. He reports nausea with mexiletine and is interested in changing medications.  Today, he is accompanied by his wife. He is feeling overall well. He has been compliant with his medication and believes it has been helping significantly.   He previously was experiencing nausea, weakness, and fatigue where he had to spend most his time in bed for about 3 weeks. For the last 2 weeks he has felt much better.   He denies any palpitations, chest pain, shortness of breath, or peripheral edema. No lightheadedness, headaches, syncope, orthopnea, or PND.  Past Medical History:  Diagnosis Date   Abdominal aortic aneurysm (AAA) (HCC)    Achilles tendinitis of right lower extremity    AICD (automatic cardioverter/defibrillator) present    Arthritis    "my whole body" (03/19/2018)   Atherosclerosis of coronary artery bypass graft w/o angina pectoris    Atrial fibrillation (HCC)    Barrett's esophagus    CAD (coronary artery disease)    Carotid artery disease (HCC)    CHF (congestive heart failure) (HCC)    Chronic anticoagulation    PE   Chronic lower back pain    Dyspnea    Dysrhythmia    Essential tremor    GERD (gastroesophageal reflux disease)    High cholesterol    Hyperlipidemia    Hyperplasia, prostate    Hypertension    Hypothyroidism    Myocardial infarction ([place])    2 in Jan. 2019   Nail dystrophy    OSA on CPAP     uses CPAP   Osteoarthrosis    Pneumonia    "now and once before" (03/19/2018)   Pulmonary embolism [facility])    April 2012 after CABG   S/P CABG (coronary artery bypass graft)    Sleep apnea    Spontaneous pneumothorax    left spontaneous pneumothorax/left pleural effusion, s/p chest tube 01/09/19; thoracentesis x2, s/p left VATS/tacl pleurodesis 04/12/19    Past Surgical History:  Procedure Laterality Date   ABDOMINAL AORTIC ENDOVASCULAR STENT GRAFT N/A 08/06/2022   Procedure: ABDOMINAL AORTIC ENDOVASCULAR STENT GRAFT;  Surgeon: Serafina Mitchell, MD;  Location: [place];  Service: Vascular;  Laterality: N/A;   ACHILLES TENDON REPAIR Bilateral    2006 & 2004   CARDIAC CATHETERIZATION  11/2010   CORONARY ANGIOGRAPHY N/A 10/22/2018   Procedure: CORONARY ANGIOGRAPHY;  Surgeon: Troy Sine, MD;  Location: [place] CV LAB;  Service: Cardiovascular;  Laterality: N/A;   CORONARY ARTERY BYPASS GRAFT  March 2012   CABG X 5   ESOPHAGOGASTRODUODENOSCOPY (EGD) WITH PROPOFOL N/A 12/10/2020   Procedure: ESOPHAGOGASTRODUODENOSCOPY (EGD) WITH PROPOFOL;  Surgeon: Doran Stabler, MD;  Location: [facility] END[facility];  Service: Gastroenterology;  Laterality: N/A;   ICD IMPLANT N/A 02/25/2022   Procedure: ICD IMPLANT;  Surgeon: Vickie Epley, MD;  Location: [place] CV LAB;  Service: Cardiovascular;  Laterality: N/A;  INGUINAL HERNIA REPAIR Left 1980   INTRAVASCULAR PRESSURE WIRE/FFR STUDY N/A 10/22/2018   Procedure: INTRAVASCULAR PRESSURE WIRE/FFR STUDY;  Surgeon: Troy Sine, MD;  Location: [place] CV LAB;  Service: Cardiovascular;  Laterality: N/A;   IR THORACENTESIS ASP PLEURAL SPACE W/IMG GUIDE  02/04/2019   IR THORACENTESIS ASP PLEURAL SPACE W/IMG GUIDE  02/24/2019   KNEE ARTHROSCOPY Left 2006   LEFT HEART CATH AND CORS/GRAFTS ANGIOGRAPHY N/A 10/18/2018   Procedure: LEFT HEART CATH AND CORS/GRAFTS ANGIOGRAPHY;  Surgeon: Lorretta Harp, MD;  Location: [place] CV LAB;  Service:  Cardiovascular;  Laterality: N/A;   LEFT HEART CATH AND CORS/GRAFTS ANGIOGRAPHY N/A 10/21/2018   Procedure: LEFT HEART CATH AND CORS/GRAFTS ANGIOGRAPHY;  Surgeon: Troy Sine, MD;  Location: [place] CV LAB;  Service: Cardiovascular;  Laterality: N/A;   PACEMAKER IMPLANT N/A 02/24/2022   Procedure: PACEMAKER IMPLANT;  Surgeon: Deboraha Sprang, MD;  Location: [place] CV LAB;  Service: Cardiovascular;  Laterality: N/A;   PENILE PROSTHESIS IMPLANT     PLEURAL BIOPSY Left 04/12/2019   Procedure: Pleural Biopsy;  Surgeon: Grace Isaac, MD;  Location: [place];  Service: Thoracic;  Laterality: Left;   PLEURAL EFFUSION DRAINAGE Left 04/12/2019   Procedure: Drainage Of Pleural Effusion;  Surgeon: Grace Isaac, MD;  Location: [place];  Service: Thoracic;  Laterality: Left;   RIGHT HEART CATHETERIZATION N/A 11/14/2013   Procedure: RIGHT HEART CATH;  Surgeon: Larey Dresser, MD;  Location: [facility] CATH LAB;  Service: Cardiovascular;  Laterality: N/A;   RIGHT/LEFT HEART CATH AND CORONARY/GRAFT ANGIOGRAPHY N/A 10/15/2022   Procedure: RIGHT/LEFT HEART CATH AND CORONARY/GRAFT ANGIOGRAPHY;  Surgeon: Burnell Blanks, MD;  Location: [place] CV LAB;  Service: Cardiovascular;  Laterality: N/A;   SHOULDER OPEN ROTATOR CUFF REPAIR Right 2003   TALC PLEURODESIS Left 04/12/2019   Procedure: Pietro Cassis;  Surgeon: Grace Isaac, MD;  Location: [place];  Service: Thoracic;  Laterality: Left;   TEE WITHOUT CARDIOVERSION N/A 03/24/2019   Procedure: TRANSESOPHAGEAL ECHOCARDIOGRAM (TEE);  Surgeon: Larey Dresser, MD;  Location: [facility] ENDOSCOPY;  Service: Cardiovascular;  Laterality: N/A;   TEE WITHOUT CARDIOVERSION  04/12/2019   Procedure: Transesophageal Echocardiogram (Tee);  Surgeon: Grace Isaac, MD;  Location: [facility] OR;  Service: Thoracic;;   THORACOSCOPY Left 04/12/2019   VIDEO BRONCHOSCOPY (N/A ) VIDEO ASSISTED THORACOSCOPY (Left Chest) TALC PLEURADESIS (Left    TRANSCAROTID ARTERY  REVASCULARIZATION  Right 09/13/2020   Procedure: RIGHT TRANSCAROTID ARTERY REVASCULARIZATION USING 53m X 44mENROUTE TRANSCAROTID STEND SYSTEM;  Surgeon: BrSerafina MitchellMD;  [place] [facility]: [place];  Laterality: Right;   TRANSCAROTID ARTERY REVASCULARIZATION  Left 02/08/2021   Procedure: LEFT TRANSCAROTID ARTERY REVASCULARIZATION;  Surgeon: BrSerafina MitchellMD;  [place] [facility] Service: Vascular;  Laterality: Left;   ULTRASOUND GUIDANCE FOR VASCULAR ACCESS  09/13/2020   Procedure: ULTRASOUND GUIDANCE FOR VASCULAR ACCESS;  Surgeon: BrSerafina MitchellMD;  [facility] Service: Vascular;;   ULTRASOUND GUIDANCE FOR VASCULAR ACCESS Bilateral 08/06/2022   Procedure: ULTRASOUND GUIDANCE FOR VASCULAR ACCESS;  Surgeon: BrSerafina MitchellMD;  [facility];  Service: Vascular;  Laterality: Bilateral;   VIDEO ASSISTED THORACOSCOPY Left 04/12/2019   Procedure: VIDEO ASSISTED THORACOSCOPY;  Surgeon: GeGrace IsaacMD;  [place] [facility] Service: Thoracic;  Laterality: Left;   VIDEO BRONCHOSCOPY N/A 04/12/2019   Procedure: VIDEO BRONCHOSCOPY;  Surgeon: GeGrace IsaacMD;  [place] [place]: Thor[facility];  Laterality: N/A;    Current Medications: Current Meds  Medication Sig   apixaban (ELIQUIS) 5 MG TABS tablet Take 1 tablet (5 mg total) by mouth 2 (two) times daily.   atorvastatin (LIPITOR) 80 MG tablet Take 1 tablet (80 mg total) by mouth daily.   cetirizine (ZYRTEC) 10 MG tablet Take 10 mg by mouth daily.   Cholecalciferol (VITAMIN D-3) 25 MCG (1000 UT) CAPS Take 1,000 Units by mouth in the morning.   finasteride (PROSCAR) 5 MG tablet Take 5 mg by mouth daily.   furosemide (LASIX) 20 MG tablet Take 1 tablet (20 mg total) by mouth daily.   isosorbide mononitrate (IMDUR) 30 MG 24 hr tablet Take 0.5 tablets (15 mg total) by mouth daily.   levothyroxine (SYNTHROID) 50 MCG tablet Take 50 mcg by mouth at bedtime.   metoprolol succinate (TOPROL XL) 25 MG 24 hr tablet  Take 1 tablet (25 mg total) by mouth daily.   mexiletine (MEXITIL) 150 MG capsule Take 1 capsule (150 mg total) by mouth 2 (two) times daily.   midodrine (PROAMATINE) 2.5 MG tablet Take 1 tablet (2.5 mg total) by mouth 2 (two) times daily with a meal.   Multiple Vitamin (MULTIVITAMIN WITH MINERALS) TABS tablet Take 1 tablet by mouth in the morning.   pantoprazole (PROTONIX) 40 MG tablet Take 40 mg by mouth daily before breakfast.   spironolactone (ALDACTONE) 25 MG tablet Take 0.5 tablets (12.5 mg total) by mouth at bedtime.   tamsulosin (FLOMAX) 0.4 MG CAPS capsule Take 0.4 mg by mouth every evening.   traMADol (ULTRAM) 50 MG tablet Take 50 mg by mouth every 6 (six) hours as needed for moderate pain.     Allergies:   Ancef [cefazolin sodium], Empagliflozin, and Vancomycin   Social History   Socioeconomic History   Marital status: Married    Spouse name: Not on file   Number of children: 2   Years of education: Not on file   Highest education level: Not on file  Occupational History   Occupation: Scientist, clinical (histocompatibility and immunogenetics): [employer]  Tobacco Use   Smoking status: Former    Packs/day: 1.00    Years: 35.00    Total pack years: 35.00    Types: Cigarettes    Quit date: 02/18/1984    Years since quitting: 38.7   Smokeless tobacco: Never  Vaping Use   Vaping Use: Never used  Substance and Sexual Activity   Alcohol use: Yes    Alcohol/week: 2.0 standard drinks of alcohol    Types: 2 Standard drinks or equivalent per week   Drug use: Never   Sexual activity: Not on file  Other Topics Concern   Not on file  Social History Narrative   Not on file   Social Determinants of Health   Financial Resource Strain: Not on file  Food Insecurity: No Food Insecurity (10/15/2022)   Hunger Vital Sign    Worried About Running Out of Food in the Last Year: Never true    Ran Out of Food in the Last Year: Never true  Transportation Needs: No Transportation Needs (10/15/2022)   PRAPARE - Armed forces logistics/support/administrative officer (Medical): No    Lack of Transportation (Non-Medical): No  Physical Activity: Not on file  Stress: Not on file  Social Connections: Not on file     Family History: The patient's family history includes Coronary artery disease in his mother; Heart disease in his mother; Hypertension in his mother.  ROS:   Please see the history of present illness.   (+) nausea (+) weakness (+) fatigue  All other systems reviewed and are negative.  EKGs/Labs/Other Studies Reviewed:    The following studies were reviewed today:  11/04/2022 in clinic device interrogation personally reviewed Battery longevity 100% No high-voltage therapies Atrial pacing 30%, ventricular pacing 3% Atrial fibrillation burden 30% but none since middle of November.    Recent Labs: 02/23/2022: TSH 2.442 08/06/2022: Magnesium 1.8 10/14/2022: ALT 17 10/30/2022: B Natriuretic Peptide 214.5; BUN 30; Creatinine, Ser 1.53; Hemoglobin 13.8; Platelets 186; Potassium 4.5; Sodium 139  Recent Lipid Panel    Component Value Date/Time   CHOL 108 10/30/2022 1003   TRIG 69 10/30/2022 1003   HDL 41 10/30/2022 1003   CHOLHDL 2.6 10/30/2022 1003   VLDL 14 10/30/2022 1003   LDLCALC 53 10/30/2022 1003    Physical Exam:    VS:  BP (!) 140/70   Pulse 70   Ht 6' 3"$  (1.905 m)   Wt 204 lb (92.5 kg)   SpO2 98%   BMI 25.50 kg/m     Wt Readings from Last 3 Encounters:  11/04/22 204 lb (92.5 kg)  10/30/22 202 lb 3.2 oz (91.7 kg)  10/15/22 203 lb (92.1 kg)     GEN:  Well nourished, well developed in no acute distress CARDIAC: RRR, no murmurs, rubs, gallops. ICD well healed.        ASSESSMENT:    1. Chronic systolic heart failure (HCC)   2. Paroxysmal atrial fibrillation ([place])   3. Ventricular tachycardia ([place])   4. Implantable cardioverter-defibrillator (ICD) in situ    PLAN:    In order of problems listed above:  #Chronic systolic heart failure NYHA II. Warm and dry. Follows with Dr Aundra Dubin.  Last EF 45%. Continue current medical therapy.  #VT #ICD in situ VT hx with appropriate therapies in the past.  Now tolerating mexiletine. Prior amio tox. We discussed sotalol loading during today's visit.  The patient is doing much better than when he saw Dr. Aundra Dubin and tolerating the mexiletine without any off target effects.  He would like to continue with mexiletine for now.  We will plan to load with sotalol should he have off target effects in the future or if he were to have recurrent ventricular arrhythmias.  Follow-up 6 months with APP   Medication Adjustments/Labs and Tests Ordered: Current medicines are reviewed at length with the patient today.  Concerns regarding medicines are outlined above.  No orders of the defined types were placed in this encounter.  No orders of the defined types were placed in this encounter.   I,Rachel Rivera,acting as a scribe for Vickie Epley, MD.,have documented all relevant documentation on the behalf of Vickie Epley, MD,as directed by  Vickie Epley, MD while in the presence of Vickie Epley, MD.  I, Vickie Epley, MD, have reviewed all documentation for this visit. The documentation on 11/04/22 for the exam, diagnosis, procedures, and orders are all accurate and complete.   Signed, Lars Mage, MD, [facility], [facility] 11/04/2022 8:44 AM    Electrophysiology [facility]

## 2022-11-24 ENCOUNTER — Telehealth: Payer: Self-pay

## 2022-11-24 NOTE — Telephone Encounter (Signed)
Called to confirm participation in next PREP class at [facility] on March 19; assessment visit scheduled for March 14 at 10am

## 2022-11-26 ENCOUNTER — Ambulatory Visit: Payer: Medicare Other

## 2022-11-26 DIAGNOSIS — I472 Ventricular tachycardia, unspecified: Secondary | ICD-10-CM

## 2022-11-26 LAB — CUP PACEART REMOTE DEVICE CHECK
Date Time Interrogation Session: 20240306071454
Implantable Lead Connection Status: 753985
Implantable Lead Connection Status: 753985
Implantable Lead Implant Date: 20230606
Implantable Lead Implant Date: 20230606
Implantable Lead Location: 753859
Implantable Lead Location: 753860
Implantable Lead Model: 377
Implantable Lead Model: 402266
Implantable Lead Serial Number: 8000807784
Implantable Lead Serial Number: 81503989
Implantable Pulse Generator Implant Date: 20230606
Pulse Gen Model: 429534
Pulse Gen Serial Number: 84900693

## 2022-11-28 ENCOUNTER — Other Ambulatory Visit: Payer: Self-pay

## 2022-11-28 ENCOUNTER — Ambulatory Visit: Payer: Medicare Other | Attending: Cardiology

## 2022-11-28 ENCOUNTER — Telehealth: Payer: Self-pay

## 2022-11-28 DIAGNOSIS — I472 Ventricular tachycardia, unspecified: Secondary | ICD-10-CM

## 2022-11-28 MED ORDER — METOPROLOL SUCCINATE ER 25 MG PO TB24: 12.5000 mg | ORAL_TABLET | Freq: Every day | ORAL | 1 refills | Status: DC

## 2022-11-28 MED ORDER — METOPROLOL SUCCINATE ER 25 MG PO TB24: 12.5000 mg | ORAL_TABLET | Freq: Every day | ORAL | Status: DC

## 2022-11-28 MED ORDER — METOPROLOL SUCCINATE ER 25 MG PO TB24: 25.0000 mg | ORAL_TABLET | Freq: Every day | ORAL | Status: DC

## 2022-11-28 NOTE — Telephone Encounter (Signed)
          Spoke with patient, patient had not missed any medications, patient has not been sick, patient did report that he passed out, Reviewed with Oda Kilts patient to get labs drawn today and add 12.5mg  of Toprol daily in addition to 25mg  he is taking daily,. Patient voiced understanding of these instructions. Also advised patient that if he received another shock to go to the ED, patient voiced understanding. Patient stated he had someone to drive him to get labs dawn today

## 2022-11-29 LAB — COMPREHENSIVE METABOLIC PANEL
ALT: 15 IU/L (ref 0–44)
AST: 21 IU/L (ref 0–40)
Albumin/Globulin Ratio: 1.5 (ref 1.2–2.2)
Albumin: 4.1 g/dL (ref 3.7–4.7)
Alkaline Phosphatase: 117 IU/L (ref 44–121)
BUN/Creatinine Ratio: 17 (ref 10–24)
BUN: 33 mg/dL — ABNORMAL HIGH (ref 8–27)
Bilirubin Total: 0.5 mg/dL (ref 0.0–1.2)
CO2: 24 mmol/L (ref 20–29)
Calcium: 9.4 mg/dL (ref 8.6–10.2)
Chloride: 101 mmol/L (ref 96–106)
Creatinine, Ser: 1.92 mg/dL — ABNORMAL HIGH (ref 0.76–1.27)
Globulin, Total: 2.8 g/dL (ref 1.5–4.5)
Glucose: 85 mg/dL (ref 70–99)
Potassium: 4.6 mmol/L (ref 3.5–5.2)
Sodium: 141 mmol/L (ref 134–144)
Total Protein: 6.9 g/dL (ref 6.0–8.5)
eGFR: 33 mL/min/{1.73_m2} — ABNORMAL LOW (ref >59)

## 2022-11-29 LAB — T4, FREE: Free T4: 1.9 ng/dL — ABNORMAL HIGH (ref 0.82–1.77)

## 2022-11-29 LAB — MAGNESIUM: Magnesium: 1.8 mg/dL (ref 1.6–2.3)

## 2022-11-29 LAB — TSH: TSH: 4.04 u[IU]/mL (ref 0.450–4.500)

## 2022-12-01 ENCOUNTER — Emergency Department (HOSPITAL_BASED_OUTPATIENT_CLINIC_OR_DEPARTMENT_OTHER): Payer: No Typology Code available for payment source

## 2022-12-01 ENCOUNTER — Emergency Department (HOSPITAL_BASED_OUTPATIENT_CLINIC_OR_DEPARTMENT_OTHER): Payer: No Typology Code available for payment source | Admitting: Radiology

## 2022-12-01 ENCOUNTER — Other Ambulatory Visit: Payer: Self-pay

## 2022-12-01 ENCOUNTER — Encounter (HOSPITAL_BASED_OUTPATIENT_CLINIC_OR_DEPARTMENT_OTHER): Payer: Self-pay

## 2022-12-01 ENCOUNTER — Inpatient Hospital Stay (HOSPITAL_BASED_OUTPATIENT_CLINIC_OR_DEPARTMENT_OTHER)
Admission: EM | Admit: 2022-12-01 | Discharge: 2022-12-04 | DRG: 312 | Disposition: A | Discharge status: Home Health | Payer: No Typology Code available for payment source | Attending: Internal Medicine | Admitting: Internal Medicine

## 2022-12-01 DIAGNOSIS — Z95810 Presence of automatic (implantable) cardiac defibrillator: Secondary | ICD-10-CM

## 2022-12-01 DIAGNOSIS — R55 Syncope and collapse: Secondary | ICD-10-CM | POA: Diagnosis not present

## 2022-12-01 DIAGNOSIS — E7800 Pure hypercholesterolemia, unspecified: Secondary | ICD-10-CM | POA: Diagnosis present

## 2022-12-01 DIAGNOSIS — J90 Pleural effusion, not elsewhere classified: Secondary | ICD-10-CM | POA: Diagnosis not present

## 2022-12-01 DIAGNOSIS — X509XXA Other and unspecified overexertion or strenuous movements or postures, initial encounter: Secondary | ICD-10-CM

## 2022-12-01 DIAGNOSIS — Z881 Allergy status to other antibiotic agents status: Secondary | ICD-10-CM

## 2022-12-01 DIAGNOSIS — N183 Chronic kidney disease, stage 3 unspecified: Secondary | ICD-10-CM | POA: Diagnosis present

## 2022-12-01 DIAGNOSIS — T1490XA Injury, unspecified, initial encounter: Secondary | ICD-10-CM | POA: Diagnosis not present

## 2022-12-01 DIAGNOSIS — R296 Repeated falls: Secondary | ICD-10-CM | POA: Diagnosis present

## 2022-12-01 DIAGNOSIS — I253 Aneurysm of heart: Secondary | ICD-10-CM | POA: Diagnosis present

## 2022-12-01 DIAGNOSIS — S51812A Laceration without foreign body of left forearm, initial encounter: Secondary | ICD-10-CM | POA: Diagnosis present

## 2022-12-01 DIAGNOSIS — Z981 Arthrodesis status: Secondary | ICD-10-CM

## 2022-12-01 DIAGNOSIS — I700 Atherosclerosis of aorta: Secondary | ICD-10-CM | POA: Diagnosis present

## 2022-12-01 DIAGNOSIS — I4819 Other persistent atrial fibrillation: Secondary | ICD-10-CM | POA: Diagnosis present

## 2022-12-01 DIAGNOSIS — I130 Hypertensive heart and chronic kidney disease with heart failure and stage 1 through stage 4 chronic kidney disease, or unspecified chronic kidney disease: Secondary | ICD-10-CM | POA: Diagnosis present

## 2022-12-01 DIAGNOSIS — W1830XA Fall on same level, unspecified, initial encounter: Secondary | ICD-10-CM | POA: Diagnosis present

## 2022-12-01 DIAGNOSIS — Z8249 Family history of ischemic heart disease and other diseases of the circulatory system: Secondary | ICD-10-CM

## 2022-12-01 DIAGNOSIS — I350 Nonrheumatic aortic (valve) stenosis: Secondary | ICD-10-CM | POA: Diagnosis present

## 2022-12-01 DIAGNOSIS — I5022 Chronic systolic (congestive) heart failure: Secondary | ICD-10-CM | POA: Diagnosis present

## 2022-12-01 DIAGNOSIS — K219 Gastro-esophageal reflux disease without esophagitis: Secondary | ICD-10-CM | POA: Diagnosis present

## 2022-12-01 DIAGNOSIS — N1831 Chronic kidney disease, stage 3a: Secondary | ICD-10-CM | POA: Diagnosis present

## 2022-12-01 DIAGNOSIS — Z86711 Personal history of pulmonary embolism: Secondary | ICD-10-CM

## 2022-12-01 DIAGNOSIS — S22079A Unspecified fracture of T9-T10 vertebra, initial encounter for closed fracture: Secondary | ICD-10-CM | POA: Diagnosis present

## 2022-12-01 DIAGNOSIS — I10 Essential (primary) hypertension: Secondary | ICD-10-CM | POA: Diagnosis present

## 2022-12-01 DIAGNOSIS — S22070A Wedge compression fracture of T9-T10 vertebra, initial encounter for closed fracture: Secondary | ICD-10-CM | POA: Diagnosis not present

## 2022-12-01 DIAGNOSIS — I252 Old myocardial infarction: Secondary | ICD-10-CM

## 2022-12-01 DIAGNOSIS — K449 Diaphragmatic hernia without obstruction or gangrene: Secondary | ICD-10-CM | POA: Diagnosis not present

## 2022-12-01 DIAGNOSIS — E039 Hypothyroidism, unspecified: Secondary | ICD-10-CM | POA: Diagnosis present

## 2022-12-01 DIAGNOSIS — I255 Ischemic cardiomyopathy: Secondary | ICD-10-CM | POA: Diagnosis present

## 2022-12-01 DIAGNOSIS — Z951 Presence of aortocoronary bypass graft: Secondary | ICD-10-CM

## 2022-12-01 DIAGNOSIS — Z7901 Long term (current) use of anticoagulants: Secondary | ICD-10-CM

## 2022-12-01 DIAGNOSIS — I2510 Atherosclerotic heart disease of native coronary artery without angina pectoris: Secondary | ICD-10-CM | POA: Diagnosis present

## 2022-12-01 DIAGNOSIS — Z79890 Hormone replacement therapy (postmenopausal): Secondary | ICD-10-CM

## 2022-12-01 DIAGNOSIS — M545 Low back pain, unspecified: Secondary | ICD-10-CM | POA: Diagnosis present

## 2022-12-01 DIAGNOSIS — Z79899 Other long term (current) drug therapy: Secondary | ICD-10-CM

## 2022-12-01 DIAGNOSIS — M1990 Unspecified osteoarthritis, unspecified site: Secondary | ICD-10-CM | POA: Diagnosis present

## 2022-12-01 DIAGNOSIS — I9789 Other postprocedural complications and disorders of the circulatory system, not elsewhere classified: Secondary | ICD-10-CM | POA: Diagnosis present

## 2022-12-01 DIAGNOSIS — G8929 Other chronic pain: Secondary | ICD-10-CM | POA: Diagnosis present

## 2022-12-01 DIAGNOSIS — Z043 Encounter for examination and observation following other accident: Secondary | ICD-10-CM | POA: Diagnosis not present

## 2022-12-01 DIAGNOSIS — N1832 Chronic kidney disease, stage 3b: Secondary | ICD-10-CM | POA: Diagnosis present

## 2022-12-01 DIAGNOSIS — I5042 Chronic combined systolic (congestive) and diastolic (congestive) heart failure: Secondary | ICD-10-CM | POA: Diagnosis present

## 2022-12-01 DIAGNOSIS — S22078A Other fracture of T9-T10 vertebra, initial encounter for closed fracture: Secondary | ICD-10-CM | POA: Diagnosis present

## 2022-12-01 DIAGNOSIS — I951 Orthostatic hypotension: Secondary | ICD-10-CM | POA: Diagnosis not present

## 2022-12-01 DIAGNOSIS — J9811 Atelectasis: Secondary | ICD-10-CM | POA: Diagnosis not present

## 2022-12-01 DIAGNOSIS — E785 Hyperlipidemia, unspecified: Secondary | ICD-10-CM | POA: Diagnosis present

## 2022-12-01 DIAGNOSIS — I25810 Atherosclerosis of coronary artery bypass graft(s) without angina pectoris: Secondary | ICD-10-CM | POA: Diagnosis present

## 2022-12-01 DIAGNOSIS — G4733 Obstructive sleep apnea (adult) (pediatric): Secondary | ICD-10-CM | POA: Diagnosis present

## 2022-12-01 DIAGNOSIS — Z87891 Personal history of nicotine dependence: Secondary | ICD-10-CM

## 2022-12-01 LAB — CBC WITH DIFFERENTIAL/PLATELET
Abs Immature Granulocytes: 0.1 10*3/uL — ABNORMAL HIGH (ref 0.00–0.07)
Basophils Absolute: 0 10*3/uL (ref 0.0–0.1)
Basophils Relative: 0 %
Eosinophils Absolute: 0.2 10*3/uL (ref 0.0–0.5)
Eosinophils Relative: 2 %
HCT: 42 % (ref 39.0–52.0)
Hemoglobin: 13.4 g/dL (ref 13.0–17.0)
Immature Granulocytes: 1 %
Lymphocytes Relative: 11 %
Lymphs Abs: 1.1 10*3/uL (ref 0.7–4.0)
MCH: 30.4 pg (ref 26.0–34.0)
MCHC: 31.9 g/dL (ref 30.0–36.0)
MCV: 95.2 fL (ref 80.0–100.0)
Monocytes Absolute: 1.2 10*3/uL — ABNORMAL HIGH (ref 0.1–1.0)
Monocytes Relative: 12 %
Neutro Abs: 7.4 10*3/uL (ref 1.7–7.7)
Neutrophils Relative %: 74 %
Platelets: 150 10*3/uL (ref 150–400)
RBC: 4.41 MIL/uL (ref 4.22–5.81)
RDW: 14.7 % (ref 11.5–15.5)
WBC: 10 10*3/uL (ref 4.0–10.5)
nRBC: 0 % (ref 0.0–0.2)

## 2022-12-01 LAB — BRAIN NATRIURETIC PEPTIDE: B Natriuretic Peptide: 284.1 pg/mL — ABNORMAL HIGH (ref 0.0–100.0)

## 2022-12-01 LAB — COMPREHENSIVE METABOLIC PANEL
ALT: 11 U/L (ref 0–44)
AST: 16 U/L (ref 15–41)
Albumin: 3.5 g/dL (ref 3.5–5.0)
Alkaline Phosphatase: 72 U/L (ref 38–126)
Anion gap: 9 (ref 5–15)
BUN: 37 mg/dL — ABNORMAL HIGH (ref 8–23)
CO2: 27 mmol/L (ref 22–32)
Calcium: 9.5 mg/dL (ref 8.9–10.3)
Chloride: 101 mmol/L (ref 98–111)
Creatinine, Ser: 1.43 mg/dL — ABNORMAL HIGH (ref 0.61–1.24)
GFR, Estimated: 47 mL/min — ABNORMAL LOW (ref >60)
Glucose, Bld: 113 mg/dL — ABNORMAL HIGH (ref 70–99)
Potassium: 4.5 mmol/L (ref 3.5–5.1)
Sodium: 137 mmol/L (ref 135–145)
Total Bilirubin: 0.9 mg/dL (ref 0.3–1.2)
Total Protein: 6.9 g/dL (ref 6.5–8.1)

## 2022-12-01 LAB — PROTIME-INR
INR: 1.4 — ABNORMAL HIGH (ref 0.8–1.2)
Prothrombin Time: 17.1 seconds — ABNORMAL HIGH (ref 11.4–15.2)

## 2022-12-01 LAB — TROPONIN I (HIGH SENSITIVITY)
Troponin I (High Sensitivity): 10 ng/L (ref <18)
Troponin I (High Sensitivity): 13 ng/L (ref <18)

## 2022-12-01 MED ORDER — IOHEXOL 300 MG/ML  SOLN
100.0000 mL | Freq: Once | INTRAMUSCULAR | Status: AC | PRN
  Administered 2022-12-01: 60 mL via INTRAVENOUS

## 2022-12-01 MED ORDER — IOHEXOL 350 MG/ML SOLN
100.0000 mL | Freq: Once | INTRAVENOUS | Status: AC | PRN
  Administered 2022-12-01: 70 mL via INTRAVENOUS

## 2022-12-01 MED ORDER — FENTANYL CITRATE PF 50 MCG/ML IJ SOSY
50.0000 ug | PREFILLED_SYRINGE | Freq: Once | INTRAMUSCULAR | Status: AC
  Administered 2022-12-01: 50 ug via INTRAVENOUS
  Filled 2022-12-01: qty 1

## 2022-12-01 MED ORDER — ONDANSETRON HCL 4 MG/2ML IJ SOLN
4.0000 mg | Freq: Once | INTRAMUSCULAR | Status: AC
  Administered 2022-12-01: 4 mg via INTRAVENOUS
  Filled 2022-12-01: qty 2

## 2022-12-01 NOTE — ED Triage Notes (Signed)
Patient here POV from Home.  Endorses 2 Falls recently. One on Thursday in which he "got up to fast" and fell onto the Floor. Possible Syncopal Episode then. Fall once more on Saturday in which he fell forward.   Takes Eliquis. Old Laceration to Forehead from Fall on Saturday. Pain to Back, Right Rib cage.   NAD Noted during Triage. A&Ox4. GCS 15. BIB Wheelchair.

## 2022-12-01 NOTE — ED Provider Notes (Signed)
[facility] Provider Note   CSN: [id number] Arrival date & time: 12/01/22  1427     History  Chief Complaint  Patient presents with   Hansel Feinstein is a 89 y.o. male.   Fall     89 year old male with medical history significant for CAD s/p CABG x5 in 11/2010 with subsequent NSTEMI in 09/2018 which was treated medically, ischemic cardiomyopathy/ chronic HFrEF with EF of 40-45% on last Echo in 02/2022, VT and syncope s/p dual chamber Biotronik ICD in 02/2022, persistent atrial fibrillation on Eliquis,  chronic incompetence, spontaneous pneumothorax in 12/2018 with subsequent recurrent left pleural effusion s/p VATS in 03/2019, bilateral carotid diease s/p right TCAR in 08/2020 and left TCAR in 01/2021, AAA s/p EVAR in 07/2022, orthostatic hypotension on Midodrine, hyperlipidemia, prior PE after CABG, and obstructive sleep apnea on CPAP who presents to the emergency department.  Episode of syncope.  The patient had 2 syncopal episodes over the past several days.  The first was on Thursday which time he got up fast and fell to the floor, no head trauma.  He landed on his right ribs and sustained back pain and has been having midthoracic back pain since.  Additionally, the patient stated on Saturday and had a second syncopal episode order which time he fell forward and sustained a small laceration to his scalp which has, hemostatic.  He is on Eliquis and has been compliant with the medication.  He denies any abdominal pain.  He endorses midthoracic back pain and pain on the right side of his rib cage.  He arrived at the emergency department GCS 15, ABC intact.  Home Medications Prior to Admission medications   Medication Sig Start Date End Date Taking? Authorizing Provider  apixaban (ELIQUIS) 5 MG TABS tablet Take 1 tablet (5 mg total) by mouth 2 (two) times daily. 10/16/22   Lelon Perla, MD  atorvastatin (LIPITOR) 80 MG tablet Take 1  tablet (80 mg total) by mouth daily. 11/01/18   Larey Dresser, MD  cetirizine (ZYRTEC) 10 MG tablet Take 10 mg by mouth daily.    [provider]  Cholecalciferol (VITAMIN D-3) 25 MCG (1000 UT) CAPS Take 1,000 Units by mouth in the morning.    [provider]  finasteride (PROSCAR) 5 MG tablet Take 5 mg by mouth daily. 08/20/22   [provider]  furosemide (LASIX) 20 MG tablet Take 1 tablet (20 mg total) by mouth daily. 07/17/22   Larey Dresser, MD  isosorbide mononitrate (IMDUR) 30 MG 24 hr tablet Take 0.5 tablets (15 mg total) by mouth daily. 07/17/22   Larey Dresser, MD  levothyroxine (SYNTHROID) 50 MCG tablet Take 50 mcg by mouth at bedtime.    [provider]  metoprolol succinate (TOPROL XL) 25 MG 24 hr tablet Take 1 tablet (25 mg total) by mouth daily. 11/28/22   Shirley Friar, PA-C  metoprolol succinate (TOPROL XL) 25 MG 24 hr tablet Take 0.5 tablets (12.5 mg total) by mouth daily. Take 12.'5mg'$  12 hours apart from '25mg'$  tablet dose 11/28/22   Shirley Friar, PA-C  mexiletine (MEXITIL) 150 MG capsule Take 1 capsule (150 mg total) by mouth 2 (two) times daily. 07/17/22   Larey Dresser, MD  midodrine (PROAMATINE) 2.5 MG tablet Take 1 tablet (2.5 mg total) by mouth 2 (two) times daily with a meal. 07/07/22   Baldwin Jamaica, PA-C  Multiple Vitamin (MULTIVITAMIN WITH  MINERALS) TABS tablet Take 1 tablet by mouth in the morning.    [provider]  pantoprazole (PROTONIX) 40 MG tablet Take 40 mg by mouth daily before breakfast.    [provider]  spironolactone (ALDACTONE) 25 MG tablet Take 0.5 tablets (12.5 mg total) by mouth at bedtime. 10/16/22   Darreld Mclean, PA-C  tamsulosin (FLOMAX) 0.4 MG CAPS capsule Take 0.4 mg by mouth every evening.    [provider]  traMADol (ULTRAM) 50 MG tablet Take 50 mg by mouth every 6 (six) hours as needed for moderate pain. 02/26/22   [provider]       Allergies    Ancef [cefazolin sodium], Empagliflozin, and Vancomycin    Review of Systems   Review of Systems  Musculoskeletal:  Positive for back pain.  Neurological:  Positive for syncope.  All other systems reviewed and are negative.   Physical Exam Updated Vital Signs BP 103/78 (BP Location: Right Arm)   Pulse 70   Temp (!) 96.7 F (35.9 C) (Temporal)   Resp 18   Ht '6\' 3"'$  (1.905 m)   Wt 90.7 kg   SpO2 98%   BMI 25.00 kg/m  Physical Exam Vitals and nursing note reviewed.  Constitutional:      Appearance: He is well-developed.     Comments: GCS 15, ABC intact  HENT:     Head: Normocephalic.  Eyes:     Conjunctiva/sclera: Conjunctivae normal.  Neck:     Comments: No midline tenderness to palpation of the cervical spine. ROM intact. Cardiovascular:     Rate and Rhythm: Normal rate and regular rhythm.  Pulmonary:     Effort: Pulmonary effort is normal. No respiratory distress.     Breath sounds: Normal breath sounds.  Chest:     Comments: Chest wall stable and non-tender to AP and lateral compression. Clavicles stable and non-tender to AP compression Abdominal:     Palpations: Abdomen is soft.     Tenderness: There is no abdominal tenderness.     Comments: Pelvis stable to lateral compression.  Musculoskeletal:     Cervical back: Neck supple.     Comments: Midline TTP of the thoracic spine. Extremities atraumatic with intact ROM.   Skin:    General: Skin is warm and dry.  Neurological:     Mental Status: He is alert.     Comments: CN II-XII grossly intact. Moving all four extremities spontaneously and sensation grossly intact.     ED Results / Procedures / Treatments   Labs (all labs ordered are listed, but only abnormal results are displayed) Labs Reviewed  CBC WITH DIFFERENTIAL/PLATELET - Abnormal; Notable for the following components:      Result Value   Monocytes Absolute 1.2 (*)    Abs Immature Granulocytes 0.10 (*)    All other components within  normal limits  PROTIME-INR - Abnormal; Notable for the following components:   Prothrombin Time 17.1 (*)    INR 1.4 (*)    All other components within normal limits  COMPREHENSIVE METABOLIC PANEL  BRAIN NATRIURETIC PEPTIDE  TROPONIN I (HIGH SENSITIVITY)    EKG EKG Interpretation  Date/Time:  Monday December 01 2022 14:40:29 EDT Ventricular Rate:  80 PR Interval:  159 QRS Duration: 162 QT Interval:  443 QTC Calculation: 512 R Axis:   -66 Text Interpretation: Sinus rhythm RBBB and LAFB Inferior infarct, old Confirmed by Regan Lemming ([id number]) on 12/01/2022 2:45:18 PM  Radiology CT Chest W Contrast  Result  Date: 12/01/2022 CLINICAL DATA:  89 year old male presents following fall with history of syncope. EXAM: CT CHEST WITH CONTRAST TECHNIQUE: Multidetector CT imaging of the chest was performed during intravenous contrast administration. RADIATION DOSE REDUCTION: This exam was performed according to the departmental dose-optimization program which includes automated exposure control, adjustment of the mA and/or kV according to patient size and/or use of iterative reconstruction technique. CONTRAST:  110m OMNIPAQUE IOHEXOL 300 MG/ML  SOLN COMPARISON:  October 14, 2022 July 24, 2022. FINDINGS: Cardiovascular: Dual lead pacer defibrillator remains in place. Thinning of the free wall of the LEFT ventricle with similar appearance and associated with a pseudoaneurysm that extends inferiorly from the LEFT heart, unchanged since prior imaging. No pericardial effusion area measuring approximately 3.5 x 2.9 cm extending inferiorly from the LEFT ventricle. Signs of median sternotomy for coronary revascularization. Central pulmonary vasculature is unremarkable to the extent evaluated. Signs of carotid stenting in the neck, incompletely evaluated. Mediastinum/Nodes: No thoracic inlet, axillary, mediastinal or hilar adenopathy. Esophagus grossly normal. Lungs/Pleura: Small chronic LEFT-sided pleural effusion  similar volume as compared to prior imaging and extending into the major fissure in the LEFT chest. No pneumothorax. Pleural and parenchymal scarring at the LEFT lung base. Basilar atelectasis in the RIGHT chest. Airways are patent. Signs of prior talc pleurodesis over the LEFT hemidiaphragm unchanged. Findings unchanged since November of 2023. Upper Abdomen: Incidental imaging of upper abdominal contents without acute process. Musculoskeletal: Transverse fracture through the T9 vertebral body in the setting of multilevel spinal fusion extends horizontally through the upper vertebral body without definitive signs of extension into posterior elements but suspicious for hyper extension injury none the less. Degenerative changes and anterior osteophytes bridging all levels of the spine as visible on today's exam mild widening of the anterior aspect of the fracture. No sign of displaced rib fracture. IMPRESSION: 1. T9 fracture or with horizontal orientation through the vertebral body extending posteriorly into the vertebral body without definitive signs of posterior element involvement but with appearance that is highly suspicious for hyperextension injury. Multilevel spinal fusion seen throughout the thoracic spine. Type of injury seen is at risk for developing pseudoarthrosis. Spinal MRI may be helpful to exclude any associated ligamentous injuries or other findings that would suggest instability though alignment of posterior elements currently is normal. 2. 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 This configuration shows increased risk of rupture and complication but is unchanged compared to prior imaging. 3. Signs of prior talc pleurodesis and small LEFT-sided effusion. 4. Aortic atherosclerosis with coronary revascularization. Aortic Atherosclerosis (ICD10-I70.0). These results were called by telephone at the time of interpretation on 12/01/2022 at 4:03 pm [facility] who verbally  acknowledged these results. Electronically Signed   By: GZetta BillsM.D.   On: 12/01/2022 16:03   CT Head Wo Contrast  Result Date: 12/01/2022 CLINICAL DATA:  Trauma EXAM: CT HEAD WITHOUT CONTRAST CT CERVICAL SPINE WITHOUT CONTRAST TECHNIQUE: Multidetector CT imaging of the head and cervical spine was performed following the standard protocol without intravenous contrast. Multiplanar CT image reconstructions of the cervical spine were also generated. RADIATION DOSE REDUCTION: This exam was performed according to the departmental dose-optimization program which includes automated exposure control, adjustment of the mA and/or kV according to patient size and/or use of iterative reconstruction technique. COMPARISON:  CT C Spine 02/22/22 FINDINGS: CT HEAD FINDINGS Brain: No evidence of acute infarction, hemorrhage, hydrocephalus, extra-axial collection or mass lesion/mass effect. Vascular: No hyperdense vessel or unexpected calcification. Skull: Normal. Negative for  fracture or focal lesion. Sinuses/Orbits: No middle ear or mastoid effusion. Paranasal sinuses are clear. Orbits are unremarkable. Other: None. CT CERVICAL SPINE FINDINGS Alignment: Straightening of the normal cervical lordosis. Skull base and vertebrae: No acute fracture. No primary bone lesion or focal pathologic process. Soft tissues and spinal canal: Redemonstrated left common carotid artery stent with narrowing of the stent lumen in the midportion, unchanged from prior exam. Disc levels:  No evidence of high-grade spinal canal stenosis. Upper chest:  Biapical pleural-parenchymal scarring. Other: None IMPRESSION: 1. No acute intracranial abnormality. 2. No acute cervical spine fracture or traumatic listhesis. 3. Redemonstrated left common carotid artery stent with narrowing of the stent lumen in the midportion, unchanged from prior exam. Electronically Signed   By: Marin Roberts M.D.   On: 12/01/2022 15:32   CT Cervical Spine Wo Contrast  Result  Date: 12/01/2022 CLINICAL DATA:  Trauma EXAM: CT HEAD WITHOUT CONTRAST CT CERVICAL SPINE WITHOUT CONTRAST TECHNIQUE: Multidetector CT imaging of the head and cervical spine was performed following the standard protocol without intravenous contrast. Multiplanar CT image reconstructions of the cervical spine were also generated. RADIATION DOSE REDUCTION: This exam was performed according to the departmental dose-optimization program which includes automated exposure control, adjustment of the mA and/or kV according to patient size and/or use of iterative reconstruction technique. COMPARISON:  CT C Spine 02/22/22 FINDINGS: CT HEAD FINDINGS Brain: No evidence of acute infarction, hemorrhage, hydrocephalus, extra-axial collection or mass lesion/mass effect. Vascular: No hyperdense vessel or unexpected calcification. Skull: Normal. Negative for fracture or focal lesion. Sinuses/Orbits: No middle ear or mastoid effusion. Paranasal sinuses are clear. Orbits are unremarkable. Other: None. CT CERVICAL SPINE FINDINGS Alignment: Straightening of the normal cervical lordosis. Skull base and vertebrae: No acute fracture. No primary bone lesion or focal pathologic process. Soft tissues and spinal canal: Redemonstrated left common carotid artery stent with narrowing of the stent lumen in the midportion, unchanged from prior exam. Disc levels:  No evidence of high-grade spinal canal stenosis. Upper chest:  Biapical pleural-parenchymal scarring. Other: None IMPRESSION: 1. No acute intracranial abnormality. 2. No acute cervical spine fracture or traumatic listhesis. 3. Redemonstrated left common carotid artery stent with narrowing of the stent lumen in the midportion, unchanged from prior exam. Electronically Signed   By: Marin Roberts M.D.   On: 12/01/2022 15:32   DG Chest 1 View  Result Date: 12/01/2022 CLINICAL DATA:  Fall EXAM: CHEST  1 VIEW COMPARISON:  Chest x-ray 10/14/2022. FINDINGS: The heart size and mediastinal contours  are within normal limits. Both lungs are clear. No visible pleural effusions or pneumothorax. No acute osseous abnormality. Left subclavian approach cardiac rhythm maintenance device. CABG. Median sternotomy. IMPRESSION: No active disease. Electronically Signed   By: Margaretha Sheffield M.D.   On: 12/01/2022 15:16    Procedures Procedures    Medications Ordered in ED Medications  iohexol (OMNIPAQUE) 300 MG/ML solution 100 mL (60 mLs Intravenous Contrast Given 12/01/22 1524)  fentaNYL (SUBLIMAZE) injection 50 mcg (50 mcg Intravenous Given 12/01/22 1615)  ondansetron (ZOFRAN) injection 4 mg (4 mg Intravenous Given 12/01/22 1615)    ED Course/ Medical Decision Making/ A&P                             Medical Decision Making Amount and/or Complexity of Data Reviewed Labs: ordered. Radiology: ordered.  Risk Prescription drug management.   89 year old male with medical history significant for CAD s/p CABG x5 in 11/2010 with  subsequent NSTEMI in 09/2018 which was treated medically, ischemic cardiomyopathy/ chronic HFrEF with EF of 40-45% on last Echo in 02/2022, VT and syncope s/p dual chamber Biotronik ICD in 02/2022, persistent atrial fibrillation on Eliquis,  chronic incompetence, spontaneous pneumothorax in 12/2018 with subsequent recurrent left pleural effusion s/p VATS in 03/2019, bilateral carotid diease s/p right TCAR in 08/2020 and left TCAR in 01/2021, AAA s/p EVAR in 07/2022, orthostatic hypotension on Midodrine, hyperlipidemia, prior PE after CABG, and obstructive sleep apnea on CPAP who presents to the emergency department.  Episode of syncope.  The patient had 2 syncopal episodes over the past several days.  The first was on Thursday which time he got up fast and fell to the floor, no head trauma.  He landed on his right ribs and sustained back pain and has been having midthoracic back pain since.  Additionally, the patient stated on Saturday and had a second syncopal episode order which time he  fell forward and sustained a small laceration to his scalp which has, hemostatic.  He is on Eliquis and has been compliant with the medication.  He denies any abdominal pain.  He endorses midthoracic back pain and pain on the right side of his rib cage.  He arrived at the emergency department GCS 15, ABC intact.  On arrival, the patient was afebrile, vitally stable, pulse 70, BP 103/78, saturating 98% on room air.  Physical exam was significant for tenderness of the thoracic spine, abdomen distended, nontender to palpation.  Neurologic exam unremarkable.  Differential diagnosis includes pacemaker malfunction, cardiac arrhythmia, anemia, hypovolemia, less likely ruptured AAA, orthostatic hypotension or vasovagal syncope, spinal fracture, intracranial bleed.  CT imaging of the head and cervical spine was performed which revealed no acute intracranial abnormality, no fracture or dislocation of the cervical spine. IMPRESSION:  1. No acute intracranial abnormality.  2. No acute cervical spine fracture or traumatic listhesis.  3. Redemonstrated left common carotid artery stent with narrowing of  the stent lumen in the midportion, unchanged from prior exam.   CXR: No active disease.  Laboratory evaluation significant for CBC without a leukocytosis or anemia, INR 1.4, BMP, troponin, CMP pending.  The patient was administered IV fentanyl for pain control.  A CT chest with contrast was performed and results are as follows: IMPRESSION:  1. T9 fracture or with horizontal orientation through the vertebral  body extending posteriorly into the vertebral body without  definitive signs of posterior element involvement but with  appearance that is highly suspicious for hyperextension injury.  Multilevel spinal fusion seen throughout the thoracic spine. Type of  injury seen is at risk for developing pseudoarthrosis. Spinal MRI  may be helpful to exclude any associated ligamentous injuries or  other findings  that would suggest instability though alignment of  posterior elements currently is normal.  2. 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 This  configuration shows increased risk of rupture and complication but  is unchanged compared to prior imaging.  3. Signs of prior talc pleurodesis and small LEFT-sided effusion.  4. Aortic atherosclerosis with coronary revascularization.    Aortic Atherosclerosis (ICD10-I70.0).    These results were called by telephone at the time of interpretation  on 12/01/2022 at 4:03 pm to provider [facility] , who verbally  acknowledged these results.   Plan at time of signout to follow-up labs and imaging, CTA abdomen pelvis was ordered to further evaluate the patient's AAA.  Neurosurgery was consulted given the patient's T9 fracture.  The patient  states that his pacemaker is not MRI safe.  Plan for repeat assessment, likely admission for pain control in the setting of his fracture and observation in the setting of his multiple syncopal episodes.  I was informed by nursing staff aware that we did not have a Biotronik pacemaker interrogated onsite, patient will need pacemaker interrogation inpatient.  Signout given to Dr. Tamera Punt at 1600.    Final Clinical Impression(s) / ED Diagnoses Final diagnoses:  Syncope, unspecified syncope type    Rx / DC Orders ED Discharge Orders     None         Regan Lemming, MD 12/01/22 1625

## 2022-12-01 NOTE — Progress Notes (Signed)
Orthopedic Tech Progress Note Patient Details:  Eric Gordon [birth year] [id number] Called in order to [facility]anger for TLSO Patient ID: Eric Gordon, male   DOB: [birth year], 89 y.o.   MRN: [id number]  Chip Boer 12/01/2022, 4:56 PM

## 2022-12-01 NOTE — ED Notes (Signed)
Paged Ortho Tech to put TLSO Brace order in -- called by Valinda Hoar., NS

## 2022-12-01 NOTE — ED Notes (Signed)
Unable to interrogate pacemaker at this time (lack of equipment), unable to complete orthostatic vitals at this time (t9 fx). EDP aware

## 2022-12-01 NOTE — ED Notes (Signed)
Pt to scans via stretcher

## 2022-12-01 NOTE — Consult Note (Signed)
Reason for Consult:T9 fx Referring Physician: EDP  KAMARRION SCHOESSOW is an 89 y.o. male.   HPI:  89 year old gentleman with multiple medical problems and chronic low back pain has fallen twice since Thursday and has had back pain since the first fall.  The pain radiates into the right rib cage.  He denies leg pain or numbness or tingling or weakness or bowel bladder changes.  The pain is cramp-like in nature and also aching.  It is moderate.  Worse with movements.  He has been walking since the fall.  He has been admitted to the hospitalist for syncopal workup.  He has a significant cardiac history.  We were called to see him for a T9 fracture seen on imaging.  Past Medical History:  Diagnosis Date   Abdominal aortic aneurysm (AAA) (HCC)    Achilles tendinitis of right lower extremity    AICD (automatic cardioverter/defibrillator) present    Arthritis    "my whole body" (03/19/2018)   Atherosclerosis of coronary artery bypass graft w/o angina pectoris    Atrial fibrillation (HCC)    Barrett's esophagus    CAD (coronary artery disease)    Carotid artery disease (HCC)    CHF (congestive heart failure) (HCC)    Chronic anticoagulation    PE   Chronic lower back pain    Dyspnea    Dysrhythmia    Essential tremor    GERD (gastroesophageal reflux disease)    High cholesterol    Hyperlipidemia    Hyperplasia, prostate    Hypertension    Hypothyroidism    Myocardial infarction ([place])    2 in Jan. 2019   Nail dystrophy    OSA on CPAP    uses CPAP   Osteoarthrosis    Pneumonia    "now and once before" (03/19/2018)   Pulmonary embolism [facility])    April 2012 after CABG   S/P CABG (coronary artery bypass graft)    Sleep apnea    Spontaneous pneumothorax    left spontaneous pneumothorax/left pleural effusion, s/p chest tube 01/09/19; thoracentesis x2, s/p left VATS/tacl pleurodesis 04/12/19    Past Surgical History:  Procedure Laterality Date   ABDOMINAL AORTIC ENDOVASCULAR STENT GRAFT  N/A 08/06/2022   Procedure: ABDOMINAL AORTIC ENDOVASCULAR STENT GRAFT;  Surgeon: Serafina Mitchell, MD;  Location: [place];  Service: Vascular;  Laterality: N/A;   ACHILLES TENDON REPAIR Bilateral    2006 & 2004   CARDIAC CATHETERIZATION  11/2010   CORONARY ANGIOGRAPHY N/A 10/22/2018   Procedure: CORONARY ANGIOGRAPHY;  Surgeon: Troy Sine, MD;  Location: [place] CV LAB;  Service: Cardiovascular;  Laterality: N/A;   CORONARY ARTERY BYPASS GRAFT  March 2012   CABG X 5   ESOPHAGOGASTRODUODENOSCOPY (EGD) WITH PROPOFOL N/A 12/10/2020   Procedure: ESOPHAGOGASTRODUODENOSCOPY (EGD) WITH PROPOFOL;  Surgeon: Doran Stabler, MD;  Location: [facility];  Service: Gastroenterology;  Laterality: N/A;   ICD IMPLANT N/A 02/25/2022   Procedure: ICD IMPLANT;  Surgeon: Vickie Epley, MD;  Location: [place] CV LAB;  Service: Cardiovascular;  Laterality: N/A;   INGUINAL HERNIA REPAIR Left 1980   INTRAVASCULAR PRESSURE WIRE/FFR STUDY N/A 10/22/2018   Procedure: INTRAVASCULAR PRESSURE WIRE/FFR STUDY;  Surgeon: Troy Sine, MD;  Location: [place] [facility];  Service: Cardiovascular;  Laterality: N/A;   IR THORACENTESIS ASP PLEURAL SPACE W/IMG GUIDE  02/04/2019   IR THORACENTESIS ASP PLEURAL SPACE W/IMG GUIDE  02/24/2019   KNEE ARTHROSCOPY Left 2006   LEFT HEART CATH  AND CORS/GRAFTS ANGIOGRAPHY N/A 10/18/2018   Procedure: LEFT HEART CATH AND CORS/GRAFTS ANGIOGRAPHY;  Surgeon: Lorretta Harp, MD;  Location: [place] [facility];  Service: Cardiovascular;  Laterality: N/A;   LEFT HEART CATH AND CORS/GRAFTS ANGIOGRAPHY N/A 10/21/2018   Procedure: LEFT HEART CATH AND CORS/GRAFTS ANGIOGRAPHY;  Surgeon: Troy Sine, MD;  Location: [place] CV LAB;  Service: Cardiovascular;  Laterality: N/A;   PACEMAKER IMPLANT N/A 02/24/2022   Procedure: PACEMAKER IMPLANT;  Surgeon: Deboraha Sprang, MD;  Location: [place] CV LAB;  Service: Cardiovascular;  Laterality: N/A;   PENILE PROSTHESIS IMPLANT     PLEURAL  BIOPSY Left 04/12/2019   Procedure: Pleural Biopsy;  Surgeon: Grace Isaac, MD;  Location: [place];  Service: Thoracic;  Laterality: Left;   PLEURAL EFFUSION DRAINAGE Left 04/12/2019   Procedure: Drainage Of Pleural Effusion;  Surgeon: Grace Isaac, MD;  Location: [place];  Service: Thoracic;  Laterality: Left;   RIGHT HEART CATHETERIZATION N/A 11/14/2013   Procedure: RIGHT HEART CATH;  Surgeon: Larey Dresser, MD;  Location: [facility] CATH LAB;  Service: Cardiovascular;  Laterality: N/A;   RIGHT/LEFT HEART CATH AND CORONARY/GRAFT ANGIOGRAPHY N/A 10/15/2022   Procedure: RIGHT/LEFT HEART CATH AND CORONARY/GRAFT ANGIOGRAPHY;  Surgeon: Burnell Blanks, MD;  Location: [place] CV LAB;  Service: Cardiovascular;  Laterality: N/A;   SHOULDER OPEN ROTATOR CUFF REPAIR Right 2003   TALC PLEURODESIS Left 04/12/2019   Procedure: Pietro Cassis;  Surgeon: Grace Isaac, MD;  Location: [place];  Service: Thoracic;  Laterality: Left;   TEE WITHOUT CARDIOVERSION N/A 03/24/2019   Procedure: TRANSESOPHAGEAL ECHOCARDIOGRAM (TEE);  Surgeon: Larey Dresser, MD;  Location: [facility] ENDOS[facility];  Service: Cardiovascular;  Laterality: N/A;   TEE WITHOUT CARDIOVERSION  04/12/2019   Procedure: Transesophageal Echocardiogram (Tee);  Surgeon: Grace Isaac, MD;  Location: [facility] OR;  Service: Thoracic;;   THORACOSCOPY Left 04/12/2019   VIDEO BRONCHOSCOPY (N/A ) VIDEO ASSISTED THORACOSCOPY (Left Chest) TALC PLEURADESIS (Left    TRANSCAROTID ARTERY REVASCULARIZATION  Right 09/13/2020   Procedure: RIGHT TRANSCAROTID ARTERY REVASCULARIZATION USING 100m X 463mENROUTE TRANSCAROTID STEND SYSTEM;  Surgeon: BrSerafina MitchellMD;  Location: [facility] OR;  Service: Vascular;  Laterality: Right;   TRANSCAROTID ARTERY REVASCULARIZATION  Left 02/08/2021   Procedure: LEFT TRANSCAROTID ARTERY REVASCULARIZATION;  Surgeon: BrSerafina MitchellMD;  [place] Service: Vascular;  Laterality: Left;   ULTRASOUND GUIDANCE FOR VASCULAR ACCESS   09/13/2020   Procedure: ULTRASOUND GUIDANCE FOR VASCULAR ACCESS;  Surgeon: BrSerafina MitchellMD;  [facility] Service: Vascular;;   ULTRASOUND GUIDANCE FOR VASCULAR ACCESS Bilateral 08/06/2022   Procedure: ULTRASOUND GUIDANCE FOR VASCULAR ACCESS;  Surgeon: BrSerafina MitchellMD;  Location: [facility] OR;  Service: Vascular;  Laterality: Bilateral;   VIDEO ASSISTED THORACOSCOPY Left 04/12/2019   Procedure: VIDEO ASSISTED THORACOSCOPY;  Surgeon: GeGrace IsaacMD;  [place] Service: Thoracic;  Laterality: Left;   VIDEO BRONCHOSCOPY N/A 04/12/2019   Procedure: VIDEO BRONCHOSCOPY;  Surgeon: GeGrace IsaacMD;  Location: [facility];  Service: Thoracic;  Laterality: N/A;    Allergies  Allergen Reactions   Ancef [Cefazolin Sodium] Hives   Empagliflozin Other (See Comments)    Dizziness   Vancomycin Rash    Social History   Tobacco Use   Smoking status: Former    Packs/day: 1.00    Years: 35.00    Total pack years: 35.00    Types: Cigarettes    Quit date: 02/18/1984  Years since quitting: 38.8   Smokeless tobacco: Never  Substance Use Topics   Alcohol use: Yes    Alcohol/week: 2.0 standard drinks of alcohol    Types: 2 Standard drinks or equivalent per week    Comment: Occ    Family History  Problem Relation Age of Onset   Coronary artery disease Mother    Hypertension Mother    Heart disease Mother      Review of Systems  Positive ROS: neg  All other systems have been reviewed and were otherwise negative with the exception of those mentioned in the HPI and as above.  Objective: Vital signs in last 24 hours: Temp:  [96.7 F (35.9 C)] 96.7 F (35.9 C) (03/11 1433) Pulse Rate:  [70-81] 81 (03/11 1615) Resp:  [18-21] 21 (03/11 1615) BP: (103-161)/(78-81) 161/81 (03/11 1615) SpO2:  [98 %-99 %] 99 % (03/11 1615) Weight:  [90.7 kg] 90.7 kg (03/11 1433)  General Appearance: Alert, cooperative, no distress, appears stated age Head: Normocephalic, abrasion to left  frontal region Eyes: PERRL, conjunctiva/corneas clear, EOM's intact    Neck: Supple, symmetrical, trachea midline Back: Symmetric, no curvature, ROM normal, no CVA tenderness Lungs:  respirations unlabored Heart: reg Abdomen: Soft Extremities: Abrasions to the left upper extremity Pulses: 2+ and symmetric all extremities Skin: Skin color, texture, turgor normal, no rashes or lesions  NEUROLOGIC:   Mental status: A&O x4, no aphasia, good attention span, Memory and fund of knowledge appears to be the reasonable Motor Exam - grossly normal, normal tone and bulk Sensory Exam - grossly normal Reflexes: symmetric, no pathologic reflexes, No Hoffman's, No clonus Coordination -normal in the upper extremities Gait -not tested Balance -not tested Cranial Nerves: I: smell Not tested  II: visual acuity  OS: na    OD: na  II: visual fields Full to confrontation  II: pupils Equal, round, reactive to light  III,VII: ptosis None  III,IV,VI: extraocular muscles  Full ROM  V: mastication Normal  V: facial light touch sensation  Normal  V,VII: corneal reflex  Present  VII: facial muscle function - upper  Normal  VII: facial muscle function - lower Normal  VIII: hearing Not tested  IX: soft palate elevation  Normal  IX,X: gag reflex Present  XI: trapezius strength  5/5  XI: sternocleidomastoid strength 5/5  XI: neck flexion strength  5/5  XII: tongue strength  Normal    Data Review Lab Results  Component Value Date   WBC 10.0 12/01/2022   HGB 13.4 12/01/2022   HCT 42.0 12/01/2022   MCV 95.2 12/01/2022   PLT 150 12/01/2022   Lab Results  Component Value Date   NA 137 12/01/2022   K 4.5 12/01/2022   CL 101 12/01/2022   CO2 27 12/01/2022   BUN 37 (H) 12/01/2022   CREATININE 1.43 (H) 12/01/2022   GLUCOSE 113 (H) 12/01/2022   Lab Results  Component Value Date   INR 1.4 (H) 12/01/2022    Radiology: CT Chest W Contrast  Result Date: 12/01/2022 CLINICAL DATA:  89 year old male  presents following fall with history of syncope. EXAM: CT CHEST WITH CONTRAST TECHNIQUE: Multidetector CT imaging of the chest was performed during intravenous contrast administration. RADIATION DOSE REDUCTION: This exam was performed according to the departmental dose-optimization program which includes automated exposure control, adjustment of the mA and/or kV according to patient size and/or use of iterative reconstruction technique. CONTRAST:  31m OMNIPAQUE IOHEXOL 300 MG/ML  SOLN COMPARISON:  October 14, 2022 July 24, 2022. FINDINGS: Cardiovascular: Dual lead pacer defibrillator remains in place. Thinning of the free wall of the LEFT ventricle with similar appearance and associated with a pseudoaneurysm that extends inferiorly from the LEFT heart, unchanged since prior imaging. No pericardial effusion area measuring approximately 3.5 x 2.9 cm extending inferiorly from the LEFT ventricle. Signs of median sternotomy for coronary revascularization. Central pulmonary vasculature is unremarkable to the extent evaluated. Signs of carotid stenting in the neck, incompletely evaluated. Mediastinum/Nodes: No thoracic inlet, axillary, mediastinal or hilar adenopathy. Esophagus grossly normal. Lungs/Pleura: Small chronic LEFT-sided pleural effusion similar volume as compared to prior imaging and extending into the major fissure in the LEFT chest. No pneumothorax. Pleural and parenchymal scarring at the LEFT lung base. Basilar atelectasis in the RIGHT chest. Airways are patent. Signs of prior talc pleurodesis over the LEFT hemidiaphragm unchanged. Findings unchanged since November of 2023. Upper Abdomen: Incidental imaging of upper abdominal contents without acute process. Musculoskeletal: Transverse fracture through the T9 vertebral body in the setting of multilevel spinal fusion extends horizontally through the upper vertebral body without definitive signs of extension into posterior elements but suspicious for hyper  extension injury none the less. Degenerative changes and anterior osteophytes bridging all levels of the spine as visible on today's exam mild widening of the anterior aspect of the fracture. No sign of displaced rib fracture. IMPRESSION: 1. T9 fracture or with horizontal orientation through the vertebral body extending posteriorly into the vertebral body without definitive signs of posterior element involvement but with appearance that is highly suspicious for hyperextension injury. Multilevel spinal fusion seen throughout the thoracic spine. Type of injury seen is at risk for developing pseudoarthrosis. Spinal MRI may be helpful to exclude any associated ligamentous injuries or other findings that would suggest instability though alignment of posterior elements currently is normal. 2. 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 This configuration shows increased risk of rupture and complication but is unchanged compared to prior imaging. 3. Signs of prior talc pleurodesis and small LEFT-sided effusion. 4. Aortic atherosclerosis with coronary revascularization. Aortic Atherosclerosis (ICD10-I70.0). These results were called by telephone at the time of interpretation on 12/01/2022 at 4:03 pm to provider [facility] , who verbally acknowledged these results. Electronically Signed   By: Zetta Bills M.D.   On: 12/01/2022 16:03   CT Head Wo Contrast  Result Date: 12/01/2022 CLINICAL DATA:  Trauma EXAM: CT HEAD WITHOUT CONTRAST CT CERVICAL SPINE WITHOUT CONTRAST TECHNIQUE: Multidetector CT imaging of the head and cervical spine was performed following the standard protocol without intravenous contrast. Multiplanar CT image reconstructions of the cervical spine were also generated. RADIATION DOSE REDUCTION: This exam was performed according to the departmental dose-optimization program which includes automated exposure control, adjustment of the mA and/or kV according to patient size and/or use of  iterative reconstruction technique. COMPARISON:  CT C Spine 02/22/22 FINDINGS: CT HEAD FINDINGS Brain: No evidence of acute infarction, hemorrhage, hydrocephalus, extra-axial collection or mass lesion/mass effect. Vascular: No hyperdense vessel or unexpected calcification. Skull: Normal. Negative for fracture or focal lesion. Sinuses/Orbits: No middle ear or mastoid effusion. Paranasal sinuses are clear. Orbits are unremarkable. Other: None. CT CERVICAL SPINE FINDINGS Alignment: Straightening of the normal cervical lordosis. Skull base and vertebrae: No acute fracture. No primary bone lesion or focal pathologic process. Soft tissues and spinal canal: Redemonstrated left common carotid artery stent with narrowing of the stent lumen in the midportion, unchanged from prior exam. Disc levels:  No evidence of high-grade spinal canal stenosis. Upper chest:  Biapical pleural-parenchymal scarring. Other: None IMPRESSION: 1. No acute intracranial abnormality. 2. No acute cervical spine fracture or traumatic listhesis. 3. Redemonstrated left common carotid artery stent with narrowing of the stent lumen in the midportion, unchanged from prior exam. Electronically Signed   By: Marin Roberts M.D.   On: 12/01/2022 15:32   CT Cervical Spine Wo Contrast  Result Date: 12/01/2022 CLINICAL DATA:  Trauma EXAM: CT HEAD WITHOUT CONTRAST CT CERVICAL SPINE WITHOUT CONTRAST TECHNIQUE: Multidetector CT imaging of the head and cervical spine was performed following the standard protocol without intravenous contrast. Multiplanar CT image reconstructions of the cervical spine were also generated. RADIATION DOSE REDUCTION: This exam was performed according to the departmental dose-optimization program which includes automated exposure control, adjustment of the mA and/or kV according to patient size and/or use of iterative reconstruction technique. COMPARISON:  CT C Spine 02/22/22 FINDINGS: CT HEAD FINDINGS Brain: No evidence of acute  infarction, hemorrhage, hydrocephalus, extra-axial collection or mass lesion/mass effect. Vascular: No hyperdense vessel or unexpected calcification. Skull: Normal. Negative for fracture or focal lesion. Sinuses/Orbits: No middle ear or mastoid effusion. Paranasal sinuses are clear. Orbits are unremarkable. Other: None. CT CERVICAL SPINE FINDINGS Alignment: Straightening of the normal cervical lordosis. Skull base and vertebrae: No acute fracture. No primary bone lesion or focal pathologic process. Soft tissues and spinal canal: Redemonstrated left common carotid artery stent with narrowing of the stent lumen in the midportion, unchanged from prior exam. Disc levels:  No evidence of high-grade spinal canal stenosis. Upper chest:  Biapical pleural-parenchymal scarring. Other: None IMPRESSION: 1. No acute intracranial abnormality. 2. No acute cervical spine fracture or traumatic listhesis. 3. Redemonstrated left common carotid artery stent with narrowing of the stent lumen in the midportion, unchanged from prior exam. Electronically Signed   By: Marin Roberts M.D.   On: 12/01/2022 15:32   DG Chest 1 View  Result Date: 12/01/2022 CLINICAL DATA:  Fall EXAM: CHEST  1 VIEW COMPARISON:  Chest x-ray 10/14/2022. FINDINGS: The heart size and mediastinal contours are within normal limits. Both lungs are clear. No visible pleural effusions or pneumothorax. No acute osseous abnormality. Left subclavian approach cardiac rhythm maintenance device. CABG. Median sternotomy. IMPRESSION: No active disease. Electronically Signed   By: Margaretha Sheffield M.D.   On: 12/01/2022 15:16    Assessment/Plan: Estimated body mass index is 25 kg/m as calculated from the following:   Height as of this encounter: '6\' 3"'$  (1.905 m).   Weight as of this encounter: 16.1 kg.   89 year old gentleman with multiple medical problems who fell Thursday and again Saturday and has had right rib cage pain and back pain since that time.  He has had a  history of chronic low back pain.  Imaging shows a T9 Chance fracture involving the vertebral body without involving the posterior elements.  I had a long talk with he and his wife, who is also an old patient of mine, regarding the typical management of a T9 Chance fracture in the face of  DISH.  This can be an unstable fracture with DISH that can be brittle, but given his medical comorbidities and advanced age and the fact that it only involves the anterior 2 columns I suspect the risk of surgery may outweigh the risk of treatment in a brace.  I explained that there is risk either way, but if he were my father I would probably want him to try the brace first.  Especially given the fact that this is a hyperextension  injury and when he loads his spine he will be in a flexion type of loading and I suspect it will be stable.  We will order a TLSO brace and get upright imaging in the TLSO brace.  We will try to avoid surgery as long as he can tolerate the pain and as long as it does not show worsening of the fracture with upright loading in the brace.   Eustace Moore 12/01/2022 5:15 PM

## 2022-12-01 NOTE — ED Provider Notes (Signed)
Patient care was taken over from Dr. Armandina Gemma.  Patient presented with weakness over the weekend with lightheadedness and 2 falls, the first and related to his syncopal event and the second 1 related to his dizziness.  He has significant upper back pain.  On his CT imaging, he has a T9 chance type fracture.  Dr. Ronnald Ramp has seen the patient and recommended a TLSO brace.  Will plan admission to the hospitalist service.  He does have a known aortic aneurysm which appears to be stable.  He has a endoleak which appears to be unchanged.  I spoke with Dr.Tu with the hospitalist service to admit the patient for further treatment.   Malvin Johns, MD 12/01/22 Dre[place]

## 2022-12-01 NOTE — Progress Notes (Signed)
Plan of Care Note for accepted transfer   Patient: Eric Gordon MRN: [id number]   [place]: 12/01/2022  Facility requesting transfer: [facility] Requesting Provider: Malvin Johns, MD Reason for transfer: T9 Facility course: 89 yo CAD s/p CABG, AAA s/p repair, HTN, persistent a.fib on Eliquis, pacemaker, CKD 3 presents with syncope and multiple falls several days ago now with upper back pain.   Has T9 chance type fracture seen on imaging. Neurosurgery has evaluated and had discussion with him regarding surgery vs brace given fracture could be unstable. Will proceed with TLSO brace for now and get upright imaging in TLSO brace.   Needs Admit for syncope workup and pain control  Couldn't interrogate pacemaker at [facility] since they do not have the equipement. Pacemaker also not MRI compatible.     Plan of care: The patient is accepted for admission to Telemetry unit, at [facility]..  ED MD to continue care of pt while he remains in ED.   Author: Orene Desanctis, DO 12/01/2022  Check [email or web address] for on-call coverage.  Nursing staff, Please call [place] number on Amion as soon as patient's arrival, so appropriate admitting provider can evaluate the pt.

## 2022-12-02 DIAGNOSIS — I472 Ventricular tachycardia, unspecified: Secondary | ICD-10-CM | POA: Diagnosis not present

## 2022-12-02 DIAGNOSIS — I10 Essential (primary) hypertension: Secondary | ICD-10-CM | POA: Diagnosis not present

## 2022-12-02 DIAGNOSIS — K219 Gastro-esophageal reflux disease without esophagitis: Secondary | ICD-10-CM | POA: Diagnosis not present

## 2022-12-02 DIAGNOSIS — R55 Syncope and collapse: Secondary | ICD-10-CM | POA: Diagnosis not present

## 2022-12-02 DIAGNOSIS — I4819 Other persistent atrial fibrillation: Secondary | ICD-10-CM

## 2022-12-02 DIAGNOSIS — M1990 Unspecified osteoarthritis, unspecified site: Secondary | ICD-10-CM | POA: Diagnosis not present

## 2022-12-02 DIAGNOSIS — I255 Ischemic cardiomyopathy: Secondary | ICD-10-CM | POA: Diagnosis not present

## 2022-12-02 DIAGNOSIS — M545 Low back pain, unspecified: Secondary | ICD-10-CM | POA: Diagnosis not present

## 2022-12-02 DIAGNOSIS — S22078A Other fracture of T9-T10 vertebra, initial encounter for closed fracture: Secondary | ICD-10-CM | POA: Diagnosis not present

## 2022-12-02 DIAGNOSIS — I252 Old myocardial infarction: Secondary | ICD-10-CM | POA: Diagnosis not present

## 2022-12-02 DIAGNOSIS — I350 Nonrheumatic aortic (valve) stenosis: Secondary | ICD-10-CM | POA: Diagnosis not present

## 2022-12-02 DIAGNOSIS — I5022 Chronic systolic (congestive) heart failure: Secondary | ICD-10-CM | POA: Diagnosis not present

## 2022-12-02 DIAGNOSIS — I130 Hypertensive heart and chronic kidney disease with heart failure and stage 1 through stage 4 chronic kidney disease, or unspecified chronic kidney disease: Secondary | ICD-10-CM | POA: Diagnosis not present

## 2022-12-02 DIAGNOSIS — Z79890 Hormone replacement therapy (postmenopausal): Secondary | ICD-10-CM | POA: Diagnosis not present

## 2022-12-02 DIAGNOSIS — I25810 Atherosclerosis of coronary artery bypass graft(s) without angina pectoris: Secondary | ICD-10-CM | POA: Diagnosis not present

## 2022-12-02 DIAGNOSIS — G8929 Other chronic pain: Secondary | ICD-10-CM | POA: Diagnosis not present

## 2022-12-02 DIAGNOSIS — S22079A Unspecified fracture of T9-T10 vertebra, initial encounter for closed fracture: Secondary | ICD-10-CM | POA: Diagnosis present

## 2022-12-02 DIAGNOSIS — G4733 Obstructive sleep apnea (adult) (pediatric): Secondary | ICD-10-CM | POA: Diagnosis not present

## 2022-12-02 DIAGNOSIS — I2510 Atherosclerotic heart disease of native coronary artery without angina pectoris: Secondary | ICD-10-CM

## 2022-12-02 DIAGNOSIS — X509XXA Other and unspecified overexertion or strenuous movements or postures, initial encounter: Secondary | ICD-10-CM | POA: Diagnosis not present

## 2022-12-02 DIAGNOSIS — I951 Orthostatic hypotension: Secondary | ICD-10-CM | POA: Diagnosis not present

## 2022-12-02 DIAGNOSIS — E039 Hypothyroidism, unspecified: Secondary | ICD-10-CM | POA: Diagnosis not present

## 2022-12-02 DIAGNOSIS — I700 Atherosclerosis of aorta: Secondary | ICD-10-CM | POA: Diagnosis not present

## 2022-12-02 DIAGNOSIS — R296 Repeated falls: Secondary | ICD-10-CM | POA: Diagnosis not present

## 2022-12-02 DIAGNOSIS — N1831 Chronic kidney disease, stage 3a: Secondary | ICD-10-CM

## 2022-12-02 DIAGNOSIS — I9789 Other postprocedural complications and disorders of the circulatory system, not elsewhere classified: Secondary | ICD-10-CM | POA: Diagnosis not present

## 2022-12-02 DIAGNOSIS — E7800 Pure hypercholesterolemia, unspecified: Secondary | ICD-10-CM | POA: Diagnosis not present

## 2022-12-02 DIAGNOSIS — I253 Aneurysm of heart: Secondary | ICD-10-CM | POA: Diagnosis not present

## 2022-12-02 DIAGNOSIS — W1830XA Fall on same level, unspecified, initial encounter: Secondary | ICD-10-CM | POA: Diagnosis not present

## 2022-12-02 DIAGNOSIS — S51812A Laceration without foreign body of left forearm, initial encounter: Secondary | ICD-10-CM | POA: Diagnosis not present

## 2022-12-02 LAB — MRSA NEXT GEN BY PCR, NASAL: MRSA by PCR Next Gen: NOT DETECTED

## 2022-12-02 MED ORDER — ONDANSETRON HCL 4 MG/2ML IJ SOLN: 4.0000 mg | Freq: Four times a day (QID) | INTRAMUSCULAR | Status: DC | PRN

## 2022-12-02 MED ORDER — CYCLOBENZAPRINE HCL 10 MG PO TABS
5.0000 mg | ORAL_TABLET | Freq: Three times a day (TID) | ORAL | Status: DC | PRN
  Administered 2022-12-02 – 2022-12-03 (×2): 5 mg via ORAL
  Filled 2022-12-02 (×2): qty 1

## 2022-12-02 MED ORDER — APIXABAN 5 MG PO TABS
5.0000 mg | ORAL_TABLET | Freq: Two times a day (BID) | ORAL | Status: DC
  Administered 2022-12-02 – 2022-12-04 (×5): 5 mg via ORAL
  Filled 2022-12-02: qty 1
  Filled 2022-12-02: qty 2
  Filled 2022-12-02 (×3): qty 1

## 2022-12-02 MED ORDER — ACETAMINOPHEN 325 MG PO TABS: 650.0000 mg | ORAL_TABLET | Freq: Four times a day (QID) | ORAL | Status: DC | PRN

## 2022-12-02 MED ORDER — MIDODRINE HCL 2.5 MG PO TABS
2.5000 mg | ORAL_TABLET | Freq: Two times a day (BID) | ORAL | Status: DC
  Administered 2022-12-02 – 2022-12-04 (×4): 2.5 mg via ORAL
  Filled 2022-12-02 (×5): qty 1

## 2022-12-02 MED ORDER — FINASTERIDE 5 MG PO TABS
5.0000 mg | ORAL_TABLET | Freq: Every day | ORAL | Status: DC
  Administered 2022-12-02 – 2022-12-04 (×3): 5 mg via ORAL
  Filled 2022-12-02 (×3): qty 1

## 2022-12-02 MED ORDER — PANTOPRAZOLE SODIUM 40 MG PO TBEC
40.0000 mg | DELAYED_RELEASE_TABLET | Freq: Every day | ORAL | Status: DC
  Administered 2022-12-03 – 2022-12-04 (×2): 40 mg via ORAL
  Filled 2022-12-02 (×3): qty 1

## 2022-12-02 MED ORDER — ATORVASTATIN CALCIUM 80 MG PO TABS
80.0000 mg | ORAL_TABLET | Freq: Every day | ORAL | Status: DC
  Administered 2022-12-02 – 2022-12-04 (×3): 80 mg via ORAL
  Filled 2022-12-02: qty 2
  Filled 2022-12-02 (×2): qty 1

## 2022-12-02 MED ORDER — HYDROMORPHONE HCL 1 MG/ML IJ SOLN: 1.0000 mg | INTRAMUSCULAR | Status: DC | PRN

## 2022-12-02 MED ORDER — METOPROLOL SUCCINATE ER 25 MG PO TB24
12.5000 mg | ORAL_TABLET | Freq: Every day | ORAL | Status: DC
  Administered 2022-12-02 – 2022-12-03 (×2): 12.5 mg via ORAL
  Filled 2022-12-02 (×2): qty 1

## 2022-12-02 MED ORDER — METOPROLOL SUCCINATE ER 25 MG PO TB24
25.0000 mg | ORAL_TABLET | Freq: Every day | ORAL | Status: DC
  Administered 2022-12-03 – 2022-12-04 (×2): 25 mg via ORAL
  Filled 2022-12-02 (×2): qty 1

## 2022-12-02 MED ORDER — ACETAMINOPHEN 650 MG RE SUPP: 650.0000 mg | Freq: Four times a day (QID) | RECTAL | Status: DC | PRN

## 2022-12-02 MED ORDER — VITAMIN D 25 MCG (1000 UNIT) PO TABS
1000.0000 [IU] | ORAL_TABLET | Freq: Every morning | ORAL | Status: DC
  Administered 2022-12-03 – 2022-12-04 (×2): 1000 [IU] via ORAL
  Filled 2022-12-02 (×2): qty 1

## 2022-12-02 MED ORDER — ISOSORBIDE MONONITRATE ER 30 MG PO TB24
15.0000 mg | ORAL_TABLET | Freq: Every day | ORAL | Status: DC
  Administered 2022-12-02 – 2022-12-04 (×3): 15 mg via ORAL
  Filled 2022-12-02 (×3): qty 1

## 2022-12-02 MED ORDER — LEVOTHYROXINE SODIUM 50 MCG PO TABS
50.0000 ug | ORAL_TABLET | Freq: Every morning | ORAL | Status: DC
  Administered 2022-12-02 – 2022-12-04 (×3): 50 ug via ORAL
  Filled 2022-12-02 (×2): qty 1
  Filled 2022-12-02: qty 2

## 2022-12-02 MED ORDER — TAMSULOSIN HCL 0.4 MG PO CAPS
0.4000 mg | ORAL_CAPSULE | Freq: Every evening | ORAL | Status: DC
  Administered 2022-12-02 – 2022-12-03 (×2): 0.4 mg via ORAL
  Filled 2022-12-02 (×2): qty 1

## 2022-12-02 MED ORDER — LORATADINE 10 MG PO TABS
10.0000 mg | ORAL_TABLET | Freq: Every day | ORAL | Status: DC
  Administered 2022-12-02 – 2022-12-04 (×3): 10 mg via ORAL
  Filled 2022-12-02 (×3): qty 1

## 2022-12-02 MED ORDER — TRAMADOL HCL 50 MG PO TABS
50.0000 mg | ORAL_TABLET | Freq: Four times a day (QID) | ORAL | Status: DC | PRN
  Administered 2022-12-02: 50 mg via ORAL
  Filled 2022-12-02: qty 1

## 2022-12-02 MED ORDER — OXYCODONE HCL 5 MG PO TABS
5.0000 mg | ORAL_TABLET | ORAL | Status: DC | PRN
  Administered 2022-12-02 – 2022-12-04 (×3): 5 mg via ORAL
  Filled 2022-12-02 (×3): qty 1

## 2022-12-02 MED ORDER — ONDANSETRON HCL 4 MG PO TABS: 4.0000 mg | ORAL_TABLET | Freq: Four times a day (QID) | ORAL | Status: DC | PRN

## 2022-12-02 MED ORDER — ADULT MULTIVITAMIN W/MINERALS CH
1.0000 | ORAL_TABLET | Freq: Every morning | ORAL | Status: DC
  Administered 2022-12-03 – 2022-12-04 (×2): 1 via ORAL
  Filled 2022-12-02 (×2): qty 1

## 2022-12-02 MED ORDER — MEXILETINE HCL 150 MG PO CAPS
150.0000 mg | ORAL_CAPSULE | Freq: Two times a day (BID) | ORAL | Status: DC
  Administered 2022-12-02 – 2022-12-03 (×2): 150 mg via ORAL
  Filled 2022-12-02 (×2): qty 1

## 2022-12-02 MED ORDER — SPIRONOLACTONE 12.5 MG HALF TABLET
12.5000 mg | ORAL_TABLET | Freq: Once | ORAL | Status: AC
  Administered 2022-12-02: 12.5 mg via ORAL
  Filled 2022-12-02: qty 1

## 2022-12-02 MED ORDER — ACETAMINOPHEN 325 MG PO TABS
650.0000 mg | ORAL_TABLET | Freq: Four times a day (QID) | ORAL | Status: DC
  Administered 2022-12-02 – 2022-12-04 (×7): 650 mg via ORAL
  Filled 2022-12-02 (×7): qty 2

## 2022-12-02 MED ORDER — OXYCODONE-ACETAMINOPHEN 5-325 MG PO TABS
1.0000 | ORAL_TABLET | Freq: Once | ORAL | Status: AC
  Administered 2022-12-02: 1 via ORAL
  Filled 2022-12-02: qty 1

## 2022-12-02 MED ORDER — MORPHINE SULFATE (PF) 2 MG/ML IV SOLN
1.0000 mg | Freq: Once | INTRAVENOUS | Status: AC
  Administered 2022-12-02: 1 mg via INTRAVENOUS
  Filled 2022-12-02: qty 1

## 2022-12-02 NOTE — Assessment & Plan Note (Signed)
Continue with levothyroxine  

## 2022-12-02 NOTE — H&P (Addendum)
History and Physical    Patient: Eric Gordon [id number] DOB: [birth year] DOA: 12/01/2022 DOS: the patient was seen and examined on 12/02/2022 PCP: Shon Baton, MD  Patient coming from: Home  Chief Complaint:  Chief Complaint  Patient presents with   Fall   HPI: Eric Gordon is a 89 y.o. male with medical history significant of atrial fibrillation, coronary artery disease, dyslipidemia, heart failure with ventricular tachycardia sp ICD who presented after a mechanical fall.  Patient has been experiencing orthostatic symptoms for several months. His last evaluation per EP was on 02/13 by Dr Quentin Ore, with recommendations to continue with mexiletine. He has history of receiving therapies from his ICD in the past.  4 days prior to presenting to he ED he developed a syncope episode, apparently he was using the vacuum cleaner when he loss his consciousness, no warning or prodromal symptoms. He recovered his consciousness fairly rapid, he woke up on the floor with pain on his left side. After the event he received a call from his cardiologist office, apparently alerted by the cardiac device and was instructed to get blood work. He got blood work the following day, but has not received the report yet. The following day, 2 days prior to coming to the ED, he woke up at 3:00 am to go to the bathroom and suffered another syncope episode, this time with trauma to his head and back. When he woke up he had pain to his back, that since then has been worsening and has limited his mobility.  Pain is located at the lower upper back, 10/10 intensity, worse with movement and improved with immobility, no radiation and no associated symptoms.   Because persistent pain he was brought to [facility] ED, there he was diagnosed with  T9 fracture. Neurosurgery was consulted with recommendations for conservative non operative treatment.   Review of Systems: As mentioned in the history of present illness. All other  systems reviewed and are negative. Past Medical History:  Diagnosis Date   Abdominal aortic aneurysm (AAA) (HCC)    Achilles tendinitis of right lower extremity    AICD (automatic cardioverter/defibrillator) present    Arthritis    "my whole body" (03/19/2018)   Atherosclerosis of coronary artery bypass graft w/o angina pectoris    Atrial fibrillation (HCC)    Barrett's esophagus    CAD (coronary artery disease)    Carotid artery disease (HCC)    CHF (congestive heart failure) (HCC)    Chronic anticoagulation    PE   Chronic lower back pain    Dyspnea    Dysrhythmia    Essential tremor    GERD (gastroesophageal reflux disease)    High cholesterol    Hyperlipidemia    Hyperplasia, prostate    Hypertension    Hypothyroidism    Myocardial infarction ([place])    2 in Jan. 2019   Nail dystrophy    OSA on CPAP    uses CPAP   Osteoarthrosis    Pneumonia    "now and once before" (03/19/2018)   Pulmonary embolism [facility])    April 2012 after CABG   S/P CABG (coronary artery bypass graft)    Sleep apnea    Spontaneous pneumothorax    left spontaneous pneumothorax/left pleural effusion, s/p chest tube 01/09/19; thoracentesis x2, s/p left VATS/tacl pleurodesis 04/12/19   Past Surgical History:  Procedure Laterality Date   ABDOMINAL AORTIC ENDOVASCULAR STENT GRAFT N/A 08/06/2022   Procedure: ABDOMINAL AORTIC ENDOVASCULAR STENT GRAFT;  Surgeon: Trula Slade,  Butch Penny, MD;  Location: [place];  Service: Vascular;  Laterality: N/A;   ACHILLES TENDON REPAIR Bilateral    2006 & 2004   CARDIAC CATHETERIZATION  11/2010   CORONARY ANGIOGRAPHY N/A 10/22/2018   Procedure: CORONARY ANGIOGRAPHY;  Surgeon: Troy Sine, MD;  Location: [place] CV LAB;  Service: Cardiovascular;  Laterality: N/A;   CORONARY ARTERY BYPASS GRAFT  March 2012   CABG X 5   ESOPHAGOGASTRODUODENOSCOPY (EGD) WITH PROPOFOL N/A 12/10/2020   Procedure: ESOPHAGOGASTRODUODENOSCOPY (EGD) WITH PROPOFOL;  Surgeon: Doran Stabler, MD;   Location: [facility] ENDOSCOPY;  Service: Gastroenterology;  Laterality: N/A;   ICD IMPLANT N/A 02/25/2022   Procedure: ICD IMPLANT;  Surgeon: Vickie Epley, MD;  Location: [place] CV LAB;  Service: Cardiovascular;  Laterality: N/A;   INGUINAL HERNIA REPAIR Left 1980   INTRAVASCULAR PRESSURE WIRE/FFR STUDY N/A 10/22/2018   Procedure: INTRAVASCULAR PRESSURE WIRE/FFR STUDY;  Surgeon: Troy Sine, MD;  Location: [place] CV LAB;  Service: Cardiovascular;  Laterality: N/A;   IR THORACENTESIS ASP PLEURAL SPACE W/IMG GUIDE  02/04/2019   IR THORACENTESIS ASP PLEURAL SPACE W/IMG GUIDE  02/24/2019   KNEE ARTHROSCOPY Left 2006   LEFT HEART CATH AND CORS/GRAFTS ANGIOGRAPHY N/A 10/18/2018   Procedure: LEFT HEART CATH AND CORS/GRAFTS ANGIOGRAPHY;  Surgeon: Lorretta Harp, MD;  Location: [place] CV LAB;  Service: Cardiovascular;  Laterality: N/A;   LEFT HEART CATH AND CORS/GRAFTS ANGIOGRAPHY N/A 10/21/2018   Procedure: LEFT HEART CATH AND CORS/GRAFTS ANGIOGRAPHY;  Surgeon: Troy Sine, MD;  Location: [place] CV LAB;  Service: Cardiovascular;  Laterality: N/A;   PACEMAKER IMPLANT N/A 02/24/2022   Procedure: PACEMAKER IMPLANT;  Surgeon: Deboraha Sprang, MD;  Location: [place] CV LAB;  Service: Cardiovascular;  Laterality: N/A;   PENILE PROSTHESIS IMPLANT     PLEURAL BIOPSY Left 04/12/2019   Procedure: Pleural Biopsy;  Surgeon: Grace Isaac, MD;  Location: [place];  Service: Thoracic;  Laterality: Left;   PLEURAL EFFUSION DRAINAGE Left 04/12/2019   Procedure: Drainage Of Pleural Effusion;  Surgeon: Grace Isaac, MD;  Location: [place];  Service: Thoracic;  Laterality: Left;   RIGHT HEART CATHETERIZATION N/A 11/14/2013   Procedure: RIGHT HEART CATH;  Surgeon: Larey Dresser, MD;  Location: [facility] CATH LAB;  Service: Cardiovascular;  Laterality: N/A;   RIGHT/LEFT HEART CATH AND CORONARY/GRAFT ANGIOGRAPHY N/A 10/15/2022   Procedure: RIGHT/LEFT HEART CATH AND CORONARY/GRAFT ANGIOGRAPHY;  Surgeon:  Burnell Blanks, MD;  Location: [place] CV LAB;  Service: Cardiovascular;  Laterality: N/A;   SHOULDER OPEN ROTATOR CUFF REPAIR Right 2003   TALC PLEURODESIS Left 04/12/2019   Procedure: Pietro Cassis;  Surgeon: Grace Isaac, MD;  Location: [place];  Service: Thoracic;  Laterality: Left;   TEE WITHOUT CARDIOVERSION N/A 03/24/2019   Procedure: TRANSESOPHAGEAL ECHOCARDIOGRAM (TEE);  Surgeon: Larey Dresser, MD;  Location: [facility] ENDOSCOPY;  Service: Cardiovascular;  Laterality: N/A;   TEE WITHOUT CARDIOVERSION  04/12/2019   Procedure: Transesophageal Echocardiogram (Tee);  Surgeon: Grace Isaac, MD;  Location: [facility] OR;  Service: Thoracic;;   THORACOSCOPY Left 04/12/2019   VIDEO BRONCHOSCOPY (N/A ) VIDEO ASSISTED THORACOSCOPY (Left Chest) TALC PLEURADESIS (Left    TRANSCAROTID ARTERY REVASCULARIZATION  Right 09/13/2020   Procedure: RIGHT TRANSCAROTID ARTERY REVASCULARIZATION USING 33m X 423mENROUTE TRANSCAROTID STEND SYSTEM;  Surgeon: BrSerafina MitchellMD;  [place] Service: Vascular;  Laterality: Right;   TRANSCAROTID ARTERY REVASCULARIZATION  Left 02/08/2021   Procedure:  LEFT TRANSCAROTID ARTERY REVASCULARIZATION;  Surgeon: Serafina Mitchell, MD;  Location[facility] OR;  Service: Vascular;  Laterality: Left;   ULTRASOUND GUIDANCE FOR VASCULAR ACCESS  09/13/2020   Procedure: ULTRASOUND GUIDANCE FOR VASCULAR ACCESS;  Surgeon: Serafina Mitchell, MD;  Location: [place];  Service: Vascular;;   ULTRASOUND GUIDANCE FOR VASCULAR ACCESS Bilateral 08/06/2022   Procedure: ULTRASOUND GUIDANCE FOR VASCULAR ACCESS;  Surgeon: Serafina Mitchell, MD;  Location: [facility] OR;  Service: Vascular;  Laterality: Bilateral;   VIDEO ASSISTED THORACOSCOPY Left 04/12/2019   Procedure: VIDEO ASSISTED THORACOSCOPY;  Surgeon: Grace Isaac, MD;  Location: [place];  Service: Thoracic;  Laterality: Left;   VIDEO BRONCHOSCOPY N/A 04/12/2019   Procedure: VIDEO BRONCHOSCOPY;  Surgeon: Grace Isaac, MD;  Location:  [facility] OR;  Service: Thoracic;  Laterality: N/A;   Social History:  reports that he quit smoking about 38 years ago. His smoking use included cigarettes. He has a 35.00 pack-year smoking history. He has never used smokeless tobacco. He reports current alcohol use of about 2.0 standard drinks of alcohol per week. He reports that he does not use drugs.  Allergies  Allergen Reactions   Ancef [Cefazolin Sodium] Hives   Empagliflozin Other (See Comments)    Dizziness   Vancomycin Rash    Family History  Problem Relation Age of Onset   Coronary artery disease Mother    Hypertension Mother    Heart disease Mother     Prior to Admission medications   Medication Sig Start Date End Date Taking? Authorizing Provider  apixaban (ELIQUIS) 5 MG TABS tablet Take 1 tablet (5 mg total) by mouth 2 (two) times daily. 10/16/22  Yes Lelon Perla, MD  atorvastatin (LIPITOR) 80 MG tablet Take 1 tablet (80 mg total) by mouth daily. 11/01/18  Yes Larey Dresser, MD  cetirizine (ZYRTEC) 10 MG tablet Take 10 mg by mouth daily.   Yes [provider]  Cholecalciferol (VITAMIN D-3) 25 MCG (1000 UT) CAPS Take 1,000 Units by mouth in the morning.   Yes [provider]  finasteride (PROSCAR) 5 MG tablet Take 5 mg by mouth daily. 08/20/22  Yes [provider]  furosemide (LASIX) 20 MG tablet Take 1 tablet (20 mg total) by mouth daily. 07/17/22  Yes Larey Dresser, MD  isosorbide mononitrate (IMDUR) 30 MG 24 hr tablet Take 0.5 tablets (15 mg total) by mouth daily. 07/17/22  Yes Larey Dresser, MD  levothyroxine (SYNTHROID) 50 MCG tablet Take 50 mcg by mouth at bedtime.   Yes [provider]  metoprolol succinate (TOPROL XL) 25 MG 24 hr tablet Take 1 tablet (25 mg total) by mouth daily. 11/28/22  Yes Shirley Friar, PA-C  metoprolol succinate (TOPROL XL) 25 MG 24 hr tablet Take 0.5 tablets (12.5 mg total) by mouth daily. Take 12.'5mg'$  12 hours apart from '25mg'$  tablet dose 11/28/22   Yes Tillery, Satira Mccallum, PA-C  mexiletine (MEXITIL) 150 MG capsule Take 1 capsule (150 mg total) by mouth 2 (two) times daily. 07/17/22  Yes Larey Dresser, MD  midodrine (PROAMATINE) 2.5 MG tablet Take 1 tablet (2.5 mg total) by mouth 2 (two) times daily with a meal. 07/07/22  Yes Baldwin Jamaica, PA-C  Multiple Vitamin (MULTIVITAMIN WITH MINERALS) TABS tablet Take 1 tablet by mouth in the morning.   Yes [provider]  pantoprazole (PROTONIX) 40 MG tablet Take 40 mg by mouth daily before breakfast.   Yes [provider]  spironolactone (ALDACTONE) 25  MG tablet Take 0.5 tablets (12.5 mg total) by mouth at bedtime. 10/16/22  Yes Sande Rives E, PA-C  tamsulosin (FLOMAX) 0.4 MG CAPS capsule Take 0.4 mg by mouth every evening.   Yes [provider]  traMADol (ULTRAM) 50 MG tablet Take 50 mg by mouth every 6 (six) hours as needed for moderate pain. 02/26/22  Yes [provider]    Physical Exam: Vitals:   12/02/22 1230 12/02/22 1315 12/02/22 1400 12/02/22 1541  BP: (!) 153/70 105/60 123/74 (!) 132/95  Pulse: 76 74 97 (!) 101  Resp: (!) '22 19 16 20  '$ Temp:      TempSrc:    Oral  SpO2: 100% 98% 100% 93%  Weight:    94.4 kg  Height:    '6\' 3"'$  (1.905 m)   Neurology awake and alert ENT with no pallor or icterus Cardiovascular with S1 and S2 present and rhythmic with systolic murmur at the apex, no gallops or rubs No JVD No lower extremity edema Respiratory with no rales or wheezing, no rhonchi Abdomen with no distention or tenderness Multiple bruising upper extremities, he was a healing wound on his left head.  Data Reviewed:   Na 137, K 4,5 Cl 101, bicarbonate 27 glucose 113 bun 37 cr 1,43  BNP 284  High sensitive troponin 10 and 13  Wbc 10,0 hgb 13,4 plt 150   Chest radiograph with mild cardiomegaly, with no infiltrates or effusions, pacemaker defibrillator in place with one atrial and one ventricular lead.   EKG 80 bpm, left axis  deviation, qtc 512, right bundle branch block, atrial pacing and ventricular pacing with poor R R wave progression, with no significant ST segment or T wave changes.   Head CT with no acute changes.  Cervical CT with no fracture or traumatic lesions. 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  Abdomen CT with prior graft repair of the abdominal aortic aneurysm with persistnet type 2 endoleak and stable aneurysm sac diameter of 5,0 cm.   89 yo male with extensive cardiovascular disease including atrial fibrillation, heart failure and VT who presented with recurrent syncope, complicated with acute T9 fracture.  He is hemodynamically stable and not in heart failure. His renal function is stable with serum K above 4.  Neurosurgery has recommended not operative approach.  Admitted for pain control and work up for recurrent syncope.  Assessment and Plan: * Syncope Hx of VT  Patient with recurrent syncope, clinically seems to be cardiogenic. Note patient had orthostatic symptoms for quite some time.   Plan to continue telemetry monitoring. Continue mexileting and metoprolol.  Blood pressure support with midodrine.  Keep K at 4 and Mg at 2.  Consult cardiology/ EP.  T9 vertebral fracture (HCC) Recommended conservative not operative treatment. Continue with TLSO brace and follow up imaging per neurosurgery recommendations.   Pain control with IV hydromorphone and oral oxycodone. Muscle relaxant as needed Scheduled acetaminophen Avoid non steroidal antiinflammatory agents due side effects including worsening renal function. Add GI prophylaxis.   Chronic HFrEF (heart failure with reduced ejection fraction) (HCC) No clinical signs of exacerbation. Plan to continue with metoprolol and blood pressure support with midodrine. Limited pharmacologic therapy due to orthostatic hypotension.    Essential hypertension Continue midodrine for blood pressure  support.   CAD s/p CABG 3/12  No chest pain, continue with isosorbide and statin.   Persistent atrial fibrillation (HCC) Continue rate control with metoprolol and anticoagulation with apixaban. Currently  atrial paced rhythm.   CKD (chronic kidney disease), stage III (HCC) CKD stage 3a   Renal function with serum cr at 1.43 with K at 4,5 and serum bicarbonate at 27. Plan to continue to hold on furosemide for now, follow up renal function in am. Avoid hypotension and nephrotoxic medications.   Hypothyroidism Continue with levothyroxine  Hyperlipidemia Continue with statin therapy.       Advance Care Planning:   Code Status: Full Code   Consults: cardiology   Family Communication: I spoke with patient's wife and nephrew at the bedside, we talked in detail about patient's condition, plan of care and prognosis and all questions were addressed.   Severity of Illness: The appropriate patient status for this patient is OBSERVATION. Observation status is judged to be reasonable and necessary in order to provide the required intensity of service to ensure the patient's safety. The patient's presenting symptoms, physical exam findings, and initial radiographic and laboratory data in the context of their medical condition is felt to place them at decreased risk for further clinical deterioration. Furthermore, it is anticipated that the patient will be medically stable for discharge from the hospital within 2 midnights of admission.   Author: Tawni Millers, MD 12/02/2022 4:55 PM  For on call review [email or web address].

## 2022-12-02 NOTE — Assessment & Plan Note (Signed)
Continue midodrine for blood pressure support.  

## 2022-12-02 NOTE — ED Notes (Signed)
Pt not normally on oxygen. Oxygen removed and RT to do incentive spirometry. Will continue to assess need for it.

## 2022-12-02 NOTE — ED Provider Notes (Signed)
  Physical Exam  BP (!) 159/82   Pulse 74   Temp 98.2 F (36.8 C) (Oral)   Resp (!) 26   Ht 1.905 m (6\' 3" )   Wt 90.7 kg   SpO2 99%   BMI 25.00 kg/m   Physical Exam  Procedures  Procedures  ED Course / MDM    Medical Decision Making Amount and/or Complexity of Data Reviewed Labs: ordered. Radiology: ordered.  Risk Prescription drug management. Decision regarding hospitalization.   89 yo male with syncope/fall, t9 fracture. Seen by neurosurgery- tls placed Patient to be admitted by hospitalist for further evaluation of syncope.        Pattricia Boss, MD 12/03/22 [phone]

## 2022-12-02 NOTE — ED Notes (Addendum)
PT is resting when RN came in room. PT had removed one side of TSO. Reminded to keep it intact and strapped it back on. Pt is in pain. Requesting pain meds. Repositioned in bed.

## 2022-12-02 NOTE — Assessment & Plan Note (Signed)
CKD stage 3a   Renal function with serum cr at 1.43 with K at 4,5 and serum bicarbonate at 27. Plan to continue to hold on furosemide for now, follow up renal function in am. Avoid hypotension and nephrotoxic medications.

## 2022-12-02 NOTE — Assessment & Plan Note (Addendum)
Hx of VT  Patient with recurrent syncope, clinically seems to be cardiogenic. Note patient had orthostatic symptoms for quite some time.   Plan to continue telemetry monitoring. Continue mexileting and metoprolol.  Blood pressure support with midodrine.  Keep K at 4 and Mg at 2.  Consult cardiology/ EP.

## 2022-12-02 NOTE — Plan of Care (Signed)

## 2022-12-02 NOTE — Assessment & Plan Note (Signed)
No chest pain, continue with isosorbide and statin.

## 2022-12-02 NOTE — Assessment & Plan Note (Signed)
Continue with statin therapy.  ?

## 2022-12-02 NOTE — Consult Note (Signed)
Cardiology Consultation:   Patient ID: Eric Gordon; CQ:[id number]; [birth year]   Admit date: 12/01/2022 Date of Consult: 12/02/2022  Primary Care Provider: Shon Baton, MD Primary Cardiologist: [name], MD Primary Electrophysiologist: Vickie Epley, MD   History of Present Illness:   Eric Gordon is currently admitted following two recent syncopal episodes at home.  He has a history of ischemic cardiomyopathy with LVEF most recently 45 to 50% range, paroxysmal VT, paroxysmal atrial fibrillation, Biotronik ICD in place, and multivessel CAD status post CABG.  Two of five bypass grafts were patent at angiography in January with plan to continue medical therapy and no focal targets for PCI.  He also has moderate aortic stenosis.  History obtained from patient, also nephew that has been visiting him recently.  He has been relatively weak and short of breath over the last month, last saw Dr. Aundra Dubin and Dr. Quentin Ore in February.  Some component of orthostasis, but the 2 recent syncopal events sounded fairly sudden and without obvious prodrome.  He has been on mexiletine for treatment of VT most recently with prior history of amiodarone toxicity.  Recent device interrogation indicated episode of VT resulting in increase in beta-blocker dosing.  Actually, the possibility of sotalol load was also discussed at the last EP visit in February depending on how he did clinically.  He has a T9 fracture with brace in place, no operative intervention planned at this time.  He states that he has been taking his medications regularly.  Past Medical History:  Diagnosis Date   Abdominal aortic aneurysm (AAA) (HCC)    Achilles tendinitis of right lower extremity    AICD (automatic cardioverter/defibrillator) present    Arthritis    "my whole body" (03/19/2018)   Atherosclerosis of coronary artery bypass graft w/o angina pectoris    Atrial fibrillation (HCC)    Barrett's esophagus    CAD  (coronary artery disease)    Carotid artery disease (HCC)    CHF (congestive heart failure) (HCC)    Chronic anticoagulation    PE   Chronic lower back pain    Dyspnea    Dysrhythmia    Essential tremor    GERD (gastroesophageal reflux disease)    High cholesterol    Hyperlipidemia    Hyperplasia, prostate    Hypertension    Hypothyroidism    Myocardial infarction ([place])    2 in Jan. 2019   Nail dystrophy    OSA on CPAP    uses CPAP   Osteoarthrosis    Pneumonia    "now and once before" (03/19/2018)   Pulmonary embolism [facility])    April 2012 after CABG   S/P CABG (coronary artery bypass graft)    Sleep apnea    Spontaneous pneumothorax    left spontaneous pneumothorax/left pleural effusion, s/p chest tube 01/09/19; thoracentesis x2, s/p left VATS/tacl pleurodesis 04/12/19    Past Surgical History:  Procedure Laterality Date   ABDOMINAL AORTIC ENDOVASCULAR STENT GRAFT N/A 08/06/2022   Procedure: ABDOMINAL AORTIC ENDOVASCULAR STENT GRAFT;  Surgeon: Serafina Mitchell, MD;  Location: [place];  Service: Vascular;  Laterality: N/A;   ACHILLES TENDON REPAIR Bilateral    2006 & 2004   CARDIAC CATHETERIZATION  11/2010   CORONARY ANGIOGRAPHY N/A 10/22/2018   Procedure: CORONARY ANGIOGRAPHY;  Surgeon: Troy Sine, MD;  Location: [place] CV LAB;  Service: Cardiovascular;  Laterality: N/A;   CORONARY ARTERY BYPASS GRAFT  March 2012   CABG X 5   ESOPHAGOGASTRODUODENOSCOPY (  EGD) WITH PROPOFOL N/A 12/10/2020   Procedure: ESOPHAGOGASTRODUODENOSCOPY (EGD) WITH PROPOFOL;  Surgeon: Doran Stabler, MD;  Location: [facility] ENDOSCOPY;  Service: Gastroenterology;  Laterality: N/A;   ICD IMPLANT N/A 02/25/2022   Procedure: ICD IMPLANT;  Surgeon: Vickie Epley, MD;  Location: [place] CV LAB;  Service: Cardiovascular;  Laterality: N/A;   INGUINAL HERNIA REPAIR Left 1980   INTRAVASCULAR PRESSURE WIRE/FFR STUDY N/A 10/22/2018   Procedure: INTRAVASCULAR PRESSURE WIRE/FFR STUDY;  Surgeon: Troy Sine, MD;  Location: [place] CV LAB;  Service: Cardiovascular;  Laterality: N/A;   IR THORACENTESIS ASP PLEURAL SPACE W/IMG GUIDE  02/04/2019   IR THORACENTESIS ASP PLEURAL SPACE W/IMG GUIDE  02/24/2019   KNEE ARTHROSCOPY Left 2006   LEFT HEART CATH AND CORS/GRAFTS ANGIOGRAPHY N/A 10/18/2018   Procedure: LEFT HEART CATH AND CORS/GRAFTS ANGIOGRAPHY;  Surgeon: Lorretta Harp, MD;  Location: [place] CV LAB;  Service: Cardiovascular;  Laterality: N/A;   LEFT HEART CATH AND CORS/GRAFTS ANGIOGRAPHY N/A 10/21/2018   Procedure: LEFT HEART CATH AND CORS/GRAFTS ANGIOGRAPHY;  Surgeon: Troy Sine, MD;  Location: [place] CV LAB;  Service: Cardiovascular;  Laterality: N/A;   PACEMAKER IMPLANT N/A 02/24/2022   Procedure: PACEMAKER IMPLANT;  Surgeon: Deboraha Sprang, MD;  Location: [place] CV LAB;  Service: Cardiovascular;  Laterality: N/A;   PENILE PROSTHESIS IMPLANT     PLEURAL BIOPSY Left 04/12/2019   Procedure: Pleural Biopsy;  Surgeon: Grace Isaac, MD;  Location: [place];  Service: Thoracic;  Laterality: Left;   PLEURAL EFFUSION DRAINAGE Left 04/12/2019   Procedure: Drainage Of Pleural Effusion;  Surgeon: Grace Isaac, MD;  Location: [place];  Service: Thoracic;  Laterality: Left;   RIGHT HEART CATHETERIZATION N/A 11/14/2013   Procedure: RIGHT HEART CATH;  Surgeon: Larey Dresser, MD;  Location: [facility] CATH LAB;  Service: Cardiovascular;  Laterality: N/A;   RIGHT/LEFT HEART CATH AND CORONARY/GRAFT ANGIOGRAPHY N/A 10/15/2022   Procedure: RIGHT/LEFT HEART CATH AND CORONARY/GRAFT ANGIOGRAPHY;  Surgeon: Burnell Blanks, MD;  Location: [place] CV LAB;  Service: Cardiovascular;  Laterality: N/A;   SHOULDER OPEN ROTATOR CUFF REPAIR Right 2003   TALC PLEURODESIS Left 04/12/2019   Procedure: Pietro Cassis;  Surgeon: Grace Isaac, MD;  Location: [place];  Service: Thoracic;  Laterality: Left;   TEE WITHOUT CARDIOVERSION N/A 03/24/2019   Procedure: TRANSESOPHAGEAL  ECHOCARDIOGRAM (TEE);  Surgeon: Larey Dresser, MD;  Location: [facility] ENDOSCOPY;  Service: Cardiovascular;  Laterality: N/A;   TEE WITHOUT CARDIOVERSION  04/12/2019   Procedure: Transesophageal Echocardiogram (Tee);  Surgeon: Grace Isaac, MD;  Location: [facility] OR;  Service: Thoracic;;   THORACOSCOPY Left 04/12/2019   VIDEO BRONCHOSCOPY (N/A ) VIDEO ASSISTED THORACOSCOPY (Left Chest) TALC PLEURADESIS (Left    TRANSCAROTID ARTERY REVASCULARIZATION  Right 09/13/2020   Procedure: RIGHT TRANSCAROTID ARTERY REVASCULARIZATION USING 67m X 461mENROUTE TRANSCAROTID STEND SYSTEM;  Surgeon: BrSerafina MitchellMD;  [place] [facility] Service: Vascular;  Laterality: Right;   TRANSCAROTID ARTERY REVASCULARIZATION  Left 02/08/2021   Procedure: LEFT TRANSCAROTID ARTERY REVASCULARIZATION;  Surgeon: BrSerafina MitchellMD;  [place] [facility] Service: Vascular;  Laterality: Left;   ULTRASOUND GUIDANCE FOR VASCULAR ACCESS  09/13/2020   Procedure: ULTRASOUND GUIDANCE FOR VASCULAR ACCESS;  Surgeon: BrSerafina MitchellMD;  [place] Service: Vascular;;   ULTRASOUND GUIDANCE FOR VASCULAR ACCESS Bilateral 08/06/2022   Procedure: ULTRASOUND GUIDANCE FOR VASCULAR ACCESS;  Surgeon: BrSerafina MitchellMD;  [place] [facility]  Service: Vascular;  Laterality: Bilateral;   VIDEO ASSISTED THORACOSCOPY Left 04/12/2019   Procedure: VIDEO ASSISTED THORACOSCOPY;  Surgeon: Grace Isaac, MD;  Location: [place];  Service: Thoracic;  Laterality: Left;   VIDEO BRONCHOSCOPY N/A 04/12/2019   Procedure: VIDEO BRONCHOSCOPY;  Surgeon: Grace Isaac, MD;  [facility];  Service: Thoracic;  Laterality: N/A;     Inpatient Medications: Scheduled Meds:  acetaminophen  650 mg Oral Q6H   apixaban  5 mg Oral BID   atorvastatin  80 mg Oral Daily   [START ON 12/03/2022] cholecalciferol  1,000 Units Oral q AM   finasteride  5 mg Oral Daily   isosorbide mononitrate  15 mg Oral Daily   levothyroxine  50 mcg Oral q AM   loratadine  10 mg  Oral Daily   metoprolol succinate  12.5 mg Oral QHS   [START ON 12/03/2022] metoprolol succinate  25 mg Oral Daily   mexiletine  150 mg Oral BID   midodrine  2.5 mg Oral BID WC   [START ON 12/03/2022] multivitamin with minerals  1 tablet Oral q AM   [START ON 12/03/2022] pantoprazole  40 mg Oral QAC breakfast   tamsulosin  0.4 mg Oral QPM    PRN Meds: cyclobenzaprine, HYDROmorphone (DILAUDID) injection, ondansetron **OR** ondansetron (ZOFRAN) IV, oxyCODONE  Allergies:    Allergies  Allergen Reactions   Ancef [Cefazolin Sodium] Hives   Empagliflozin Other (See Comments)    Dizziness   Vancomycin Rash    Social History:   Social History   Socioeconomic History   Marital status: Married    Spouse name: Not on file   Number of children: 2   Years of education: Not on file   Highest education level: Not on file  Occupational History   Occupation: Scientist, clinical (histocompatibility and immunogenetics): [employer]  Tobacco Use   Smoking status: Former    Packs/day: 1.00    Years: 35.00    Total pack years: 35.00    Types: Cigarettes    Quit date: 02/18/1984    Years since quitting: 38.8   Smokeless tobacco: Never  Vaping Use   Vaping Use: Never used  Substance and Sexual Activity   Alcohol use: Yes    Alcohol/week: 2.0 standard drinks of alcohol    Types: 2 Standard drinks or equivalent per week    Comment: Occ   Drug use: Never   Sexual activity: Not on file  Other Topics Concern   Not on file  Social History Narrative   Not on file   Social Determinants of Health   Financial Resource Strain: Not on file  Food Insecurity: No Food Insecurity (12/02/2022)   Hunger Vital Sign    Worried About Running Out of Food in the Last Year: Never true    Ran Out of Food in the Last Year: Never true  Transportation Needs: No Transportation Needs (12/02/2022)   PRAPARE - Hydrologist (Medical): No    Lack of Transportation (Non-Medical): No  Physical Activity: Not on file  Stress: Not on  file  Social Connections: Not on file  Intimate Partner Violence: Not At Risk (12/02/2022)   Humiliation, Afraid, Rape, and Kick questionnaire    Fear of Current or Ex-Partner: No    Emotionally Abused: No    Physically Abused: No    Sexually Abused: No    Family History:   The patient's family history includes Coronary artery disease in his mother; Heart  disease in his mother; Hypertension in his mother.  ROS:  Please see the history of present illness.  No orthopnea or PND, no leg swelling.  All other ROS reviewed and negative.     Physical Exam/Data:   Vitals:   12/02/22 1230 12/02/22 1315 12/02/22 1400 12/02/22 1541  BP: (!) 153/70 105/60 123/74 (!) 132/95  Pulse: 76 74 97 (!) 101  Resp: (!) '22 19 16 20  '$ Temp:      TempSrc:    Oral  SpO2: 100% 98% 100% 93%  Weight:    94.4 kg  Height:    '6\' 3"'$  (1.905 m)   No intake or output data in the 24 hours ending 12/02/22 1917 Filed Weights   12/01/22 1433 12/02/22 1541  Weight: 90.7 kg 94.4 kg   Body mass index is 26.01 kg/m.   Gen: Patient appears comfortable at rest. HEENT: Conjunctiva and lids normal. Neck: Supple, no elevated JVP or carotid bruits. Lungs: Clear to auscultation, nonlabored breathing at rest. Cardiac: Regular rate and rhythm, no S3, 3/6 systolic murmur, no pericardial rub. Abdomen: Soft, nontender, bowel sounds present, no guarding or rebound. Extremities: No pitting edema, distal pulses 2+. Skin: Warm and dry. Musculoskeletal: No kyphosis. Neuropsychiatric: Alert and oriented x3, affect grossly appropriate.  EKG:  An ECG dated 12/01/2022 was personally reviewed today and demonstrated:  Atrial paced rhythm with right bundle branch block and left anterior fascicular block.  Telemetry:  I personally reviewed telemetry which shows sinus rhythm with intermittent atrial pacing, PSVT and NSVT.  Relevant CV Studies:  Cardiac catheterization 10/15/2022:   Ost Cx to Prox Cx lesion is 50% stenosed.   Ost Ramus  lesion is 100% stenosed.   Origin lesion is 100% stenosed.   Origin to Mid Graft lesion between Ramus and 3rd Mrg  is 100% stenosed.   Origin lesion is 100% stenosed.   Ost 1st Diag lesion is 70% stenosed.   Ost 1st Mrg lesion is 80% stenosed.   Mid RCA lesion is 50% stenosed.   Dist RCA lesion is 50% stenosed.   Prox LAD lesion is 50% stenosed.   SVG graft was visualized by angiography.   SVG graft was not injected.   SVG graft was not injected.   Three vessel CAD s/p 5V CABG with 2/5 patent bypass grafts Moderate proximal LAD disease. Patent LIMA to LAD. Known occlusion of SVG to Diagonal.  Moderate ostial Proximal Circumflex stenosis. Patent SVG to intermediate branch. Known occlusion of sequential segment of vein graft to OM.  Patent native RCA with moderate non-obstructive in the mid and distal vessel. Known occlusion of SVG to PDA.  Normal right and left heart pressures.    Recommendations: Continue medical management of CAD. No focal targets for PCI.   Echocardiogram 10/30/2022:  1. Left ventricular ejection fraction, by estimation, is 45 to 50%. Left  ventricular ejection fraction by PLAX is 49 %. The left ventricle has  mildly decreased function. The left ventricle demonstrates regional wall  motion abnormalities (see scoring  diagram/findings for description). Left ventricular diastolic parameters  are consistent with Grade I diastolic dysfunction (impaired relaxation).  There is moderate hypokinesis of the left ventricular, basal-mid inferior  wall and inferolateral wall.   2. Right ventricular systolic function is low normal. The right  ventricular size is mildly enlarged. There is normal pulmonary artery  systolic pressure. The estimated right ventricular systolic pressure is  99991111 mmHg.   3. The mitral valve is abnormal. Trivial mitral valve regurgitation.  Moderate to severe mitral annular calcification.   4. The aortic valve is tricuspid. There is moderate  calcification of the  aortic valve. Aortic valve regurgitation is trivial. Moderate aortic valve  stenosis. Aortic valve area, by VTI measures 1.13 cm. Aortic valve mean  gradient measures 11.0 mmHg.  Aortic valve Vmax measures 2.18 m/s. Peak gradient 19 mmHg, DI 0.40. LVOT  diameter 1.9 cm.   5. Aortic dilatation noted. There is borderline dilatation of the aortic  root, measuring 38 mm. There is mild dilatation of the ascending aorta,  measuring 43 mm.   6. The inferior vena cava is dilated in size with <50% respiratory  variability, suggesting right atrial pressure of 15 mmHg.   Laboratory Data:  Chemistry Recent Labs  Lab 11/28/22 1207 12/01/22 1552  NA 141 137  K 4.6 4.5  CL 101 101  CO2 24 27  GLUCOSE 85 113*  BUN 33* 37*  CREATININE 1.92* 1.43*  CALCIUM 9.4 9.5  GFRNONAA  --  47*  ANIONGAP  --  9    Recent Labs  Lab 11/28/22 1207 12/01/22 1552  PROT 6.9 6.9  ALBUMIN 4.1 3.5  AST 21 16  ALT 15 11  ALKPHOS 117 72  BILITOT 0.5 0.9   Hematology Recent Labs  Lab 12/01/22 1552  WBC 10.0  RBC 4.41  HGB 13.4  HCT 42.0  MCV 95.2  MCH 30.4  MCHC 31.9  RDW 14.7  PLT 150   Cardiac Enzymes Recent Labs  Lab 12/01/22 1552 12/01/22 1708  TROPONINIHS 10 13   BNP Recent Labs  Lab 12/01/22 1552  BNP 284.1*    Radiology/Studies:  DG Thoracic Spine 2 View  Result Date: 12/01/2022 CLINICAL DATA:  T9 vertebral fracture EXAM: THORACIC SPINE 2 VIEWS COMPARISON:  Chest CT 12/01/2022 FINDINGS: AP view is limited secondary to overlying artifact. Patient's known T9 vertebral body fractures not well appreciated on lateral view view. There is no vertebral body height loss or malalignment. Mild degenerative changes are seen throughout the spine. IMPRESSION: Patient's known T9 vertebral body fracture not well on this study. Electronically Signed   By: Ronney Asters M.D.   On: 12/01/2022 19:47   CT Angio Abd/Pel W and/or Wo Contrast  Result Date: 12/01/2022 CLINICAL  DATA:  Abdominal aortic aneurysm postop follow-up EXAM: CTA ABDOMEN AND PELVIS WITHOUT AND WITH CONTRAST TECHNIQUE: Multidetector CT imaging of the abdomen and pelvis was performed using the standard protocol during bolus administration of intravenous contrast. Multiplanar reconstructed images and MIPs were obtained and reviewed to evaluate the vascular anatomy. RADIATION DOSE REDUCTION: This exam was performed according to the departmental dose-optimization program which includes automated exposure control, adjustment of the mA and/or kV according to patient size and/or use of iterative reconstruction technique. CONTRAST:  64m OMNIPAQUE IOHEXOL 350 MG/ML SOLN COMPARISON:  CTA chest, abdomen and pelvis dated October 14, 2022 FINDINGS: VASCULAR Aorta: Abdominal aortic aneurysm status post aorto bi-iliac graft repair. Graft is patent. Native aneurysm sac measures 5.0 x 4.6 cm, not significantly changed when compared with prior exam and remeasured in similar plane. Areas of contrast opacification are seen within the excluded aneurysm sac on delayed imaging, for example series 11 image 51-56, unchanged when compared with the prior exam. Celiac: Patent without evidence of aneurysm, dissection, vasculitis or significant stenosis. SMA: Patent without evidence of aneurysm, dissection, or vasculitis. Moderate narrowing at the origin due to calcified plaque. Renals: Both renal arteries are patent without evidence of aneurysm, dissection, vasculitis, or fibromuscular dysplasia. Moderate  narrowing at the origins due to calcified plaque. IMA: Occluded at the origin secondary to graft with distal reconstitution of flow via collaterals. Inflow: Patent without evidence of aneurysm, dissection, vasculitis or significant stenosis. Proximal Outflow: Bilateral common femoral and visualized portions of the superficial and profunda femoral arteries are patent without evidence of aneurysm, dissection, vasculitis or significant stenosis.  Veins: No obvious venous abnormality within the limitations of this arterial phase study. Review of the MIP images confirms the above findings. NON-VASCULAR Lower chest: Small left pleural effusion with bibasilar atelectasis. Hepatobiliary: Scattered small low-attenuation liver lesions which are too small to accurately characterize but are likely simple cysts. Gallbladder unremarkable. No biliary ductal dilation. Pancreas: Unremarkable. No pancreatic ductal dilatation or surrounding inflammatory changes. Spleen: Normal in size without focal abnormality. Adrenals/Urinary Tract: Bilateral adrenal glands are unremarkable. No hydronephrosis or nephrolithiasis. Simple appearing cyst of the bilateral kidneys. Numerous bladder trabeculations. Stomach/Bowel: Small hiatal hernia. Severe diverticulosis. Stomach is within normal limits. Appendix appears normal. No evidence of bowel wall thickening, distention, or inflammatory changes. Lymphatic: No pathologically enlarged lymph nodes seen in the abdomen or pelvis. Reproductive: Mild prostatomegaly.  Penile prosthetic device. Other: Small bilateral fat containing inguinal hernias. No abdominopelvic ascites. Musculoskeletal: No acute or significant osseous findings. IMPRESSION: 1. Prior graft repair of abdominal aortic aneurysm with persistent type 2 endoleak and stable aneurysm sac diameter of 5.0 cm. 2. Small left pleural effusion with bibasilar atelectasis. 3. Severe diverticulosis with no evidence of diverticulitis. 4. Numerous bladder trabeculations, likely due to chronic outlet obstruction. Electronically Signed   By: Yetta Glassman M.D.   On: 12/01/2022 18:16   CT Chest W Contrast  Result Date: 12/01/2022 CLINICAL DATA:  89 year old male presents following fall with history of syncope. EXAM: CT CHEST WITH CONTRAST TECHNIQUE: Multidetector CT imaging of the chest was performed during intravenous contrast administration. RADIATION DOSE REDUCTION: This exam was  performed according to the departmental dose-optimization program which includes automated exposure control, adjustment of the mA and/or kV according to patient size and/or use of iterative reconstruction technique. CONTRAST:  64m OMNIPAQUE IOHEXOL 300 MG/ML  SOLN COMPARISON:  October 14, 2022 July 24, 2022. FINDINGS: Cardiovascular: Dual lead pacer defibrillator remains in place. Thinning of the free wall of the LEFT ventricle with similar appearance and associated with a pseudoaneurysm that extends inferiorly from the LEFT heart, unchanged since prior imaging. No pericardial effusion area measuring approximately 3.5 x 2.9 cm extending inferiorly from the LEFT ventricle. Signs of median sternotomy for coronary revascularization. Central pulmonary vasculature is unremarkable to the extent evaluated. Signs of carotid stenting in the neck, incompletely evaluated. Mediastinum/Nodes: No thoracic inlet, axillary, mediastinal or hilar adenopathy. Esophagus grossly normal. Lungs/Pleura: Small chronic LEFT-sided pleural effusion similar volume as compared to prior imaging and extending into the major fissure in the LEFT chest. No pneumothorax. Pleural and parenchymal scarring at the LEFT lung base. Basilar atelectasis in the RIGHT chest. Airways are patent. Signs of prior talc pleurodesis over the LEFT hemidiaphragm unchanged. Findings unchanged since November of 2023. Upper Abdomen: Incidental imaging of upper abdominal contents without acute process. Musculoskeletal: Transverse fracture through the T9 vertebral body in the setting of multilevel spinal fusion extends horizontally through the upper vertebral body without definitive signs of extension into posterior elements but suspicious for hyper extension injury none the less. Degenerative changes and anterior osteophytes bridging all levels of the spine as visible on today's exam mild widening of the anterior aspect of the fracture. No sign of displaced rib  fracture.  IMPRESSION: 1. T9 fracture or with horizontal orientation through the vertebral body extending posteriorly into the vertebral body without definitive signs of posterior element involvement but with appearance that is highly suspicious for hyperextension injury. Multilevel spinal fusion seen throughout the thoracic spine. Type of injury seen is at risk for developing pseudoarthrosis. Spinal MRI may be helpful to exclude any associated ligamentous injuries or other findings that would suggest instability though alignment of posterior elements currently is normal. 2. 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 This configuration shows increased risk of rupture and complication but is unchanged compared to prior imaging. 3. Signs of prior talc pleurodesis and small LEFT-sided effusion. 4. Aortic atherosclerosis with coronary revascularization. Aortic Atherosclerosis (ICD10-I70.0). These results were called by telephone at the time of interpretation on 12/01/2022 at 4:03 pm to provider [facility] , who verbally acknowledged these results. Electronically Signed   By: Zetta Bills M.D.   On: 12/01/2022 16:03   CT Head Wo Contrast  Result Date: 12/01/2022 CLINICAL DATA:  Trauma EXAM: CT HEAD WITHOUT CONTRAST CT CERVICAL SPINE WITHOUT CONTRAST TECHNIQUE: Multidetector CT imaging of the head and cervical spine was performed following the standard protocol without intravenous contrast. Multiplanar CT image reconstructions of the cervical spine were also generated. RADIATION DOSE REDUCTION: This exam was performed according to the departmental dose-optimization program which includes automated exposure control, adjustment of the mA and/or kV according to patient size and/or use of iterative reconstruction technique. COMPARISON:  CT C Spine 02/22/22 FINDINGS: CT HEAD FINDINGS Brain: No evidence of acute infarction, hemorrhage, hydrocephalus, extra-axial collection or mass lesion/mass effect. Vascular: No  hyperdense vessel or unexpected calcification. Skull: Normal. Negative for fracture or focal lesion. Sinuses/Orbits: No middle ear or mastoid effusion. Paranasal sinuses are clear. Orbits are unremarkable. Other: None. CT CERVICAL SPINE FINDINGS Alignment: Straightening of the normal cervical lordosis. Skull base and vertebrae: No acute fracture. No primary bone lesion or focal pathologic process. Soft tissues and spinal canal: Redemonstrated left common carotid artery stent with narrowing of the stent lumen in the midportion, unchanged from prior exam. Disc levels:  No evidence of high-grade spinal canal stenosis. Upper chest:  Biapical pleural-parenchymal scarring. Other: None IMPRESSION: 1. No acute intracranial abnormality. 2. No acute cervical spine fracture or traumatic listhesis. 3. Redemonstrated left common carotid artery stent with narrowing of the stent lumen in the midportion, unchanged from prior exam. Electronically Signed   By: Marin Roberts M.D.   On: 12/01/2022 15:32   CT Cervical Spine Wo Contrast  Result Date: 12/01/2022 CLINICAL DATA:  Trauma EXAM: CT HEAD WITHOUT CONTRAST CT CERVICAL SPINE WITHOUT CONTRAST TECHNIQUE: Multidetector CT imaging of the head and cervical spine was performed following the standard protocol without intravenous contrast. Multiplanar CT image reconstructions of the cervical spine were also generated. RADIATION DOSE REDUCTION: This exam was performed according to the departmental dose-optimization program which includes automated exposure control, adjustment of the mA and/or kV according to patient size and/or use of iterative reconstruction technique. COMPARISON:  CT C Spine 02/22/22 FINDINGS: CT HEAD FINDINGS Brain: No evidence of acute infarction, hemorrhage, hydrocephalus, extra-axial collection or mass lesion/mass effect. Vascular: No hyperdense vessel or unexpected calcification. Skull: Normal. Negative for fracture or focal lesion. Sinuses/Orbits: No middle ear  or mastoid effusion. Paranasal sinuses are clear. Orbits are unremarkable. Other: None. CT CERVICAL SPINE FINDINGS Alignment: Straightening of the normal cervical lordosis. Skull base and vertebrae: No acute fracture. No primary bone lesion or focal pathologic process. Soft tissues and spinal canal: Redemonstrated  left common carotid artery stent with narrowing of the stent lumen in the midportion, unchanged from prior exam. Disc levels:  No evidence of high-grade spinal canal stenosis. Upper chest:  Biapical pleural-parenchymal scarring. Other: None IMPRESSION: 1. No acute intracranial abnormality. 2. No acute cervical spine fracture or traumatic listhesis. 3. Redemonstrated left common carotid artery stent with narrowing of the stent lumen in the midportion, unchanged from prior exam. Electronically Signed   By: Marin Roberts M.D.   On: 12/01/2022 15:32   DG Chest 1 View  Result Date: 12/01/2022 CLINICAL DATA:  Fall EXAM: CHEST  1 VIEW COMPARISON:  Chest x-ray 10/14/2022. FINDINGS: The heart size and mediastinal contours are within normal limits. Both lungs are clear. No visible pleural effusions or pneumothorax. No acute osseous abnormality. Left subclavian approach cardiac rhythm maintenance device. CABG. Median sternotomy. IMPRESSION: No active disease. Electronically Signed   By: Margaretha Sheffield M.D.   On: 12/01/2022 15:16    Assessment and Plan:   1.  Status post two recent falls/syncope, documentation of VT by recent device interrogation and known history of ischemic cardiomyopathy as discussed above, Biotronik ICD in place.  Has been weak and short of breath at least over the last month on baseline GDMT, mild component of orthostasis as well but current blood pressures are stable.  Toprol-XL was just increased to 25 mg in the morning and 12.5 mg in the evening after recent VT event.  He is also on mexiletine.  Dr. Quentin Ore had discussed possibility of sotalol load at last EP encounter in February.   He has a prior history of amiodarone toxicity.  2.  HFmrEF with ischemic cardiomyopathy and LVEF most recently 45 to 50%.  Two of five bypass grafts were patent at cardiac catheterization in January of this year.  History of CABG in 2012.  3.  Paroxysmal atrial fibrillation with CHA2DS2-VASc score of 4.  On Eliquis for stroke prophylaxis.  4.  Degenerative calcific aortic stenosis, moderate range by most recent echocardiogram with mean AV gradient 11 mmHg and dimensionless index 0.40.  Chart reviewed and case discussed with patient, wife, and his nephew.  For now would continue Eliquis, Lipitor, Imdur, Toprol-XL (just increased to 25 mg in the morning and 12.5 mg in the evening), midodrine, and mexiletine.  Will ask EP to round on patient tomorrow as it relates to potential sotalol load for better suppression of VT.  Toprol-XL may be able to be uptitrated further as well depending on blood pressure.  He just recently underwent cardiac catheterization and echocardiogram earlier this year for cardiac structural and ischemic evaluation.  Signed, Rozann Lesches, MD  12/02/2022 7:17 PM

## 2022-12-02 NOTE — ED Notes (Signed)
Pt. Continues to rest with eyes closed, respirations even and unlabored, no distress noted.

## 2022-12-02 NOTE — ED Notes (Signed)
Pt. Continues to rest with eyes closed, respirations even and unlabored. No distress noted.

## 2022-12-02 NOTE — ED Notes (Signed)
Pt. Resting with eyes closed, respirations even and unlabored. Pt. Denies pain, refused a blanket. Pt. Loosened his TLSO brace for comfort while trying to sleep.

## 2022-12-02 NOTE — Assessment & Plan Note (Signed)
Continue rate control with metoprolol and anticoagulation with apixaban. Currently atrial paced rhythm.

## 2022-12-02 NOTE — Assessment & Plan Note (Signed)
Recommended conservative not operative treatment. Continue with TLSO brace and follow up imaging per neurosurgery recommendations.   Pain control with IV hydromorphone and oral oxycodone. Muscle relaxant as needed Scheduled acetaminophen Avoid non steroidal antiinflammatory agents due side effects including worsening renal function. Add GI prophylaxis.

## 2022-12-02 NOTE — ED Notes (Signed)
George at [facility] will send transport as soon as one is available. Bed Ready at [facility] Rm#[id number].-ABB(NS)

## 2022-12-02 NOTE — Progress Notes (Signed)
Patient was set up with an incentive for shallow breathing due to pain. Patient tolerated well

## 2022-12-02 NOTE — Assessment & Plan Note (Signed)
No clinical signs of exacerbation. Plan to continue with metoprolol and blood pressure support with midodrine. Limited pharmacologic therapy due to orthostatic hypotension.

## 2022-12-02 NOTE — ED Notes (Signed)
Pt assisted to standing position to use urinal, pt able to stand independently while using urinal. Pt requested to sit upright on edge of bed. Pt caox4, son and nephew at the bedside advised they would be sitting with pt and would press call light when pt is ready to lay back down.

## 2022-12-03 ENCOUNTER — Telehealth: Payer: Self-pay

## 2022-12-03 DIAGNOSIS — I2510 Atherosclerotic heart disease of native coronary artery without angina pectoris: Secondary | ICD-10-CM | POA: Diagnosis not present

## 2022-12-03 DIAGNOSIS — I252 Old myocardial infarction: Secondary | ICD-10-CM | POA: Diagnosis not present

## 2022-12-03 DIAGNOSIS — I253 Aneurysm of heart: Secondary | ICD-10-CM | POA: Diagnosis present

## 2022-12-03 DIAGNOSIS — I25810 Atherosclerosis of coronary artery bypass graft(s) without angina pectoris: Secondary | ICD-10-CM | POA: Diagnosis present

## 2022-12-03 DIAGNOSIS — E039 Hypothyroidism, unspecified: Secondary | ICD-10-CM | POA: Diagnosis present

## 2022-12-03 DIAGNOSIS — R55 Syncope and collapse: Secondary | ICD-10-CM | POA: Diagnosis not present

## 2022-12-03 DIAGNOSIS — I700 Atherosclerosis of aorta: Secondary | ICD-10-CM | POA: Diagnosis present

## 2022-12-03 DIAGNOSIS — G8929 Other chronic pain: Secondary | ICD-10-CM | POA: Diagnosis present

## 2022-12-03 DIAGNOSIS — M1990 Unspecified osteoarthritis, unspecified site: Secondary | ICD-10-CM | POA: Diagnosis present

## 2022-12-03 DIAGNOSIS — I5022 Chronic systolic (congestive) heart failure: Secondary | ICD-10-CM | POA: Diagnosis not present

## 2022-12-03 DIAGNOSIS — S22078A Other fracture of T9-T10 vertebra, initial encounter for closed fracture: Secondary | ICD-10-CM | POA: Diagnosis not present

## 2022-12-03 DIAGNOSIS — R296 Repeated falls: Secondary | ICD-10-CM | POA: Diagnosis present

## 2022-12-03 DIAGNOSIS — E7800 Pure hypercholesterolemia, unspecified: Secondary | ICD-10-CM | POA: Diagnosis present

## 2022-12-03 DIAGNOSIS — K219 Gastro-esophageal reflux disease without esophagitis: Secondary | ICD-10-CM | POA: Diagnosis present

## 2022-12-03 DIAGNOSIS — X509XXA Other and unspecified overexertion or strenuous movements or postures, initial encounter: Secondary | ICD-10-CM | POA: Diagnosis not present

## 2022-12-03 DIAGNOSIS — S51812A Laceration without foreign body of left forearm, initial encounter: Secondary | ICD-10-CM | POA: Diagnosis present

## 2022-12-03 DIAGNOSIS — I472 Ventricular tachycardia, unspecified: Secondary | ICD-10-CM | POA: Diagnosis not present

## 2022-12-03 DIAGNOSIS — I350 Nonrheumatic aortic (valve) stenosis: Secondary | ICD-10-CM | POA: Diagnosis present

## 2022-12-03 DIAGNOSIS — N1831 Chronic kidney disease, stage 3a: Secondary | ICD-10-CM | POA: Diagnosis not present

## 2022-12-03 DIAGNOSIS — I4819 Other persistent atrial fibrillation: Secondary | ICD-10-CM | POA: Diagnosis not present

## 2022-12-03 DIAGNOSIS — I255 Ischemic cardiomyopathy: Secondary | ICD-10-CM | POA: Diagnosis present

## 2022-12-03 DIAGNOSIS — M545 Low back pain, unspecified: Secondary | ICD-10-CM | POA: Diagnosis present

## 2022-12-03 DIAGNOSIS — I130 Hypertensive heart and chronic kidney disease with heart failure and stage 1 through stage 4 chronic kidney disease, or unspecified chronic kidney disease: Secondary | ICD-10-CM | POA: Diagnosis present

## 2022-12-03 DIAGNOSIS — G4733 Obstructive sleep apnea (adult) (pediatric): Secondary | ICD-10-CM | POA: Diagnosis present

## 2022-12-03 DIAGNOSIS — W1830XA Fall on same level, unspecified, initial encounter: Secondary | ICD-10-CM | POA: Diagnosis present

## 2022-12-03 DIAGNOSIS — I9789 Other postprocedural complications and disorders of the circulatory system, not elsewhere classified: Secondary | ICD-10-CM | POA: Diagnosis present

## 2022-12-03 DIAGNOSIS — I951 Orthostatic hypotension: Secondary | ICD-10-CM | POA: Diagnosis present

## 2022-12-03 DIAGNOSIS — Z79890 Hormone replacement therapy (postmenopausal): Secondary | ICD-10-CM | POA: Diagnosis not present

## 2022-12-03 LAB — BASIC METABOLIC PANEL
Anion gap: 10 (ref 5–15)
BUN: 34 mg/dL — ABNORMAL HIGH (ref 8–23)
CO2: 24 mmol/L (ref 22–32)
Calcium: 9.2 mg/dL (ref 8.9–10.3)
Chloride: 101 mmol/L (ref 98–111)
Creatinine, Ser: 1.49 mg/dL — ABNORMAL HIGH (ref 0.61–1.24)
GFR, Estimated: 45 mL/min — ABNORMAL LOW (ref >60)
Glucose, Bld: 135 mg/dL — ABNORMAL HIGH (ref 70–99)
Potassium: 4.1 mmol/L (ref 3.5–5.1)
Sodium: 135 mmol/L (ref 135–145)

## 2022-12-03 LAB — CBC
HCT: 40.5 % (ref 39.0–52.0)
Hemoglobin: 13.2 g/dL (ref 13.0–17.0)
MCH: 31.1 pg (ref 26.0–34.0)
MCHC: 32.6 g/dL (ref 30.0–36.0)
MCV: 95.3 fL (ref 80.0–100.0)
Platelets: 135 10*3/uL — ABNORMAL LOW (ref 150–400)
RBC: 4.25 MIL/uL (ref 4.22–5.81)
RDW: 14.6 % (ref 11.5–15.5)
WBC: 8.8 10*3/uL (ref 4.0–10.5)
nRBC: 0 % (ref 0.0–0.2)

## 2022-12-03 LAB — MAGNESIUM: Magnesium: 1.9 mg/dL (ref 1.7–2.4)

## 2022-12-03 MED ORDER — AMIODARONE HCL 200 MG PO TABS
200.0000 mg | ORAL_TABLET | Freq: Two times a day (BID) | ORAL | Status: DC
  Administered 2022-12-03: 200 mg via ORAL
  Filled 2022-12-03: qty 1

## 2022-12-03 MED ORDER — AMIODARONE HCL 200 MG PO TABS
200.0000 mg | ORAL_TABLET | Freq: Two times a day (BID) | ORAL | Status: DC
  Administered 2022-12-03 – 2022-12-04 (×2): 200 mg via ORAL
  Filled 2022-12-03 (×2): qty 1

## 2022-12-03 MED ORDER — AMIODARONE HCL 200 MG PO TABS: 200.0000 mg | ORAL_TABLET | Freq: Every day | ORAL | Status: DC

## 2022-12-03 MED ORDER — SENNOSIDES-DOCUSATE SODIUM 8.6-50 MG PO TABS
2.0000 | ORAL_TABLET | Freq: Two times a day (BID) | ORAL | Status: DC
  Administered 2022-12-03 – 2022-12-04 (×3): 2 via ORAL
  Filled 2022-12-03 (×3): qty 2

## 2022-12-03 NOTE — TOC Initial Note (Signed)
Transition of Care [facility]) - Initial/Assessment Note    Patient Details  Name: Eric Gordon MRN: [id number] Date of Birth: 11-28-1930  Transition of Care [facility]) CM/SW Contact:    Cyndi Bender, RN Phone Number: 12/03/2022, 1:27 PM  Clinical Narrative:      Spoke to patient and wife at bedside regarding transition needs.  Patient lives with wife and has all needed DME. Patient agreeable to home health.  Offered choice and patient deferred to this RNCM  to find highly rated agency.  Patient has transportation home and to apts.  Address, Phone number and PCP verified.             TOC will continue to follow for needs.   Need Home health PT, OT orders.   Expected Discharge Plan: [place] Barriers to Discharge: Continued Medical Work up   Patient Goals and CMS Choice Patient states their goals for this hospitalization and ongoing recovery are:: return hom CMS Medicare.gov Compare Post Acute Care list provided to:: Patient Choice offered to / list presented to : Patient      Expected Discharge Plan and Services   Discharge Planning Services: CM Consult Post Acute Care Choice: [place] arrangements for the past 2 months: [place] Arranged: PT, OT HH Agency: [facility] Date [place]: 12/03/22 Time [place]: 1326 Representative spoke with at [place]: Tommi Rumps  Prior Living Arrangements/Services Living arrangements for the past 2 months: [place] with:: Spouse Patient language and need for interpreter reviewed:: Yes Do you feel safe going back to the place where you live?: Yes      Need for Family Participation in Patient Care: Yes (Comment) Care giver support system in place?: Yes (comment) Current home services: DME (walker, cane, shower chair) Criminal Activity/Legal Involvement Pertinent to Current Situation/Hospitalization: No - Comment as  needed  Activities of Daily Living Home Assistive Devices/Equipment: Environmental consultant (specify type), Hearing aid, Cane (specify quad or straight) ADL Screening (condition at time of admission) Patient's cognitive ability adequate to safely complete daily activities?: Yes Is the patient deaf or have difficulty hearing?: No Does the patient have difficulty seeing, even when wearing glasses/contacts?: No Does the patient have difficulty concentrating, remembering, or making decisions?: No Patient able to express need for assistance with ADLs?: Yes Does the patient have difficulty dressing or bathing?: No Independently performs ADLs?: Yes (appropriate for developmental age) Does the patient have difficulty walking or climbing stairs?: Yes Weakness of Legs: Both Weakness of Arms/Hands: None  Permission Sought/Granted Permission sought to share information with : Investment banker, corporate granted to share info w AGENCY: HH        Emotional Assessment Appearance:: Appears stated age Attitude/Demeanor/Rapport: Gracious Affect (typically observed): Accepting Orientation: : Oriented to Situation, Oriented to  Time, Oriented to Place, Oriented to Self Alcohol / Substance Use: Not Applicable Psych Involvement: No (comment)  Admission diagnosis:  Syncope [R55] Syncope, unspecified syncope type [R55] Closed fracture of ninth thoracic vertebra, unspecified fracture morphology, initial encounter [facility]) [S22.079A] Patient Active Problem List   Diagnosis Date Noted   T9 vertebral fracture ([place]) 12/02/2022   Ischemic cardiomyopathy 10/16/2022   Chronic HFrEF (heart failure with reduced ejection fraction) ([place]) 10/16/2022   VT s/p ICD 10/16/2022  Orthostatic hypotension 10/16/2022   Pseudoaneurysm ([place]) 10/16/2022   Unstable angina ([place]) 10/16/2022   Chest pain 10/14/2022   AAA (abdominal aortic aneurysm) ([place]) 08/06/2022   Hypomagnesemia 02/25/2022   Chest wall contusion  02/25/2022   Laceration of head 02/25/2022   AKI (acute kidney injury) ([place]) 02/23/2022   Hyperlipidemia 02/23/2022   PVD (peripheral vascular disease) carotid 02/23/2022   Tachy-brady syndrome ([place]) 02/23/2022   Syncope 02/22/2022   Dizzy 01/16/2022   Dizziness 01/16/2022   Dyspnea 01/01/2022   Pseudoaneurysm of left ventricle of heart 12/31/2021   Acquired aneurysm of left ventricle of heart 12/31/2021   Carotid stenosis 02/08/2021   Status post carotid endarterectomy 09/13/2020   Carotid artery stenosis, symptomatic, right 09/13/2020   Tear of lateral meniscus of knee 05/04/2020   Osteoarthritis of left knee 04/05/2020   Pain in left knee 04/03/2020   Pleural effusion 04/12/2019   Pneumothorax on left 01/10/2019   Pneumothorax, left 01/09/2019   CKD (chronic kidney disease), stage III ([place]) 01/09/2019   Acute on chronic combined systolic and diastolic CHF (congestive heart failure) ([place]) 01/09/2019   Hypothyroidism 01/09/2019   Pleural effusion on left 01/09/2019   NSTEMI (non-ST elevated myocardial infarction) ([place]) 0000000   Acute diastolic CHF (congestive heart failure) ([facility])    Non-ST elevation (NSTEMI) myocardial infarction ([place]) 10/17/2018   CAP (community acquired pneumonia) 03/19/2018   Hoarseness 12/15/2013   Aortic stenosis 11/03/2012   Persistent atrial fibrillation ([place]) 04/02/2011   Carotid stenosis, asymptomatic 04/02/2011   CAD s/p CABG 3/12  01/10/2011   Abdominal aortic aneurysm ([place]) 11/06/2010   CHEST TIGHTNESS-PRESSURE-OTHER 11/01/2010   Asthma, mild intermittent 04/10/2010   DYSPNEA 02/26/2010   OBSTRUCTIVE SLEEP APNEA 08/22/2008   Essential hypertension 08/22/2008   BARRETTS ESOPHAGUS 08/18/2008   HIATAL HERNIA 08/18/2008   DIVERTICULOSIS, COLON 08/18/2008   COLONIC POLYPS, HX OF 08/18/2008   PCP:  Shon Baton, MD Pharmacy:   [place], [place] - [address] [place] [place]  [address] Phone: [phone] Fax: [place], [place] - [address] [place] [address] Phone: [phone] Fax: [phone]     Social Determinants of Health (SDOH) Social History: SDOH Screenings   Food Insecurity: No Food Insecurity (12/02/2022)  Housing: Low Risk  (12/02/2022)  Transportation Needs: No Transportation Needs (12/02/2022)  Utilities: Not At Risk (12/02/2022)  Depression (PHQ2-9): Low Risk  (03/16/2019)  Tobacco Use: Medium Risk (12/01/2022)   SDOH Interventions:     Readmission Risk Interventions     No data to display

## 2022-12-03 NOTE — Evaluation (Signed)
Physical Therapy Evaluation Patient Details Name: Eric Gordon MRN: [id number] DOB: 11-20-1930 Today's Date: 12/03/2022  History of Present Illness  89 yo admitted 3/11 after several episodes of syncope with T9 fx. PMHx: CAD s/p CABG, ICD, PAF, ICM, CHF, GERD, HLD, HTN  Clinical Impression  Pt pleasant and reports increased weakness lately with reliance on RW for gait and at least 4 falls in the last year. Pt with assist of wife and family to don brace with education for brace wear, back precautions and safety. Pt with decreased ability with transfers, gait and mobility who will benefit from acute therapy to maximize mobility and safety for return home.   HR 77-92 BP 111/54 95% on 1L       Recommendations for follow up therapy are one component of a multi-disciplinary discharge planning process, led by the attending physician.  Recommendations may be updated based on patient status, additional functional criteria and insurance authorization.  Follow Up Recommendations Home health PT      Assistance Recommended at Discharge Intermittent Supervision/Assistance  Patient can return home with the following  A little help with walking and/or transfers;A lot of help with bathing/dressing/bathroom;Assistance with cooking/housework;Assist for transportation    Equipment Recommendations None recommended by PT  Recommendations for Other Services       Functional Status Assessment Patient has had a recent decline in their functional status and demonstrates the ability to make significant improvements in function in a reasonable and predictable amount of time.     Precautions / Restrictions Precautions Precautions: Back;Fall Precaution Booklet Issued: Yes (comment) Required Braces or Orthoses: Spinal Brace Spinal Brace: Thoracolumbosacral orthotic;Applied in sitting position Restrictions Weight Bearing Restrictions: No      Mobility  Bed Mobility Overal bed mobility: Needs  Assistance Bed Mobility: Rolling, Sidelying to Sit Rolling: Min assist Sidelying to sit: Min assist       General bed mobility comments: cues for sequence with assist to roll to side and rise from surface with assist to clear legs    Transfers Overall transfer level: Needs assistance   Transfers: Sit to/from Stand Sit to Stand: Min guard           General transfer comment: cues for hand placement    Ambulation/Gait Ambulation/Gait assistance: Min guard Gait Distance (Feet): 90 Feet Assistive device: Rolling walker (2 wheels) Gait Pattern/deviations: Step-through pattern, Decreased stance time - left   Gait velocity interpretation: 1.31 - 2.62 ft/sec, indicative of limited community ambulator   General Gait Details: cues for proximity to RW, pt self-regulating distance  Stairs            Wheelchair Mobility    Modified Rankin (Stroke Patients Only)       Balance Overall balance assessment: Needs assistance Sitting-balance support: No upper extremity supported, Feet supported Sitting balance-Leahy Scale: Good     Standing balance support: Bilateral upper extremity supported, Reliant on assistive device for balance Standing balance-Leahy Scale: Poor Standing balance comment: Rw for standing and gait                             Pertinent Vitals/Pain Pain Assessment Pain Assessment: 0-10 Pain Score: 3  Pain Location: thoracic spine Pain Descriptors / Indicators: Aching, Sore Pain Intervention(s): Limited activity within patient's tolerance, Repositioned, Monitored during session, Premedicated before session    Home Living Family/patient expects to be discharged to:: Private residence Living Arrangements: Spouse/significant other Available Help at Discharge: Family;Available 24  hours/day Type of Home: House Home Access: Stairs to enter Entrance Stairs-Rails: Left Entrance Stairs-Number of Steps: 4   Home Layout: Two level;Able to live  on main level with bedroom/bathroom Home Equipment: Cane - single point;Rolling Walker (2 wheels);Shower seat;Toilet riser Additional Comments: 4 falls in last year, pt was a Manufacturing engineer    Prior Function Prior Level of Function : Independent/Modified Independent;Driving;History of Falls (last six months)             Mobility Comments: walks with RW       Hand Dominance        Extremity/Trunk Assessment   Upper Extremity Assessment Upper Extremity Assessment: Overall WFL for tasks assessed    Lower Extremity Assessment Lower Extremity Assessment: Overall WFL for tasks assessed    Cervical / Trunk Assessment Cervical / Trunk Assessment: Kyphotic  Communication   Communication: No difficulties  Cognition Arousal/Alertness: Awake/alert Behavior During Therapy: WFL for tasks assessed/performed Overall Cognitive Status: Within Functional Limits for tasks assessed                                          General Comments      Exercises     Assessment/Plan    PT Assessment Patient needs continued PT services  PT Problem List Decreased mobility;Decreased activity tolerance;Decreased balance;Decreased knowledge of use of DME       PT Treatment Interventions Gait training;Therapeutic exercise;Patient/family education;Stair training;Functional mobility training;DME instruction;Therapeutic activities;Balance training    PT Goals (Current goals can be found in the Care Plan section)  Acute Rehab PT Goals Patient Stated Goal: return home PT Goal Formulation: With patient Time For Goal Achievement: 12/17/22 Potential to Achieve Goals: Good    Frequency Min 3X/week     Co-evaluation               AM-PAC PT "6 Clicks" Mobility  Outcome Measure Help needed turning from your back to your side while in a flat bed without using bedrails?: A Little Help needed moving from lying on your back to sitting on the side of a flat bed without using  bedrails?: A Little Help needed moving to and from a bed to a chair (including a wheelchair)?: A Little Help needed standing up from a chair using your arms (e.g., wheelchair or bedside chair)?: A Little Help needed to walk in hospital room?: A Little Help needed climbing 3-5 steps with a railing? : A Lot 6 Click Score: 17    End of Session Equipment Utilized During Treatment: Gait belt;Back brace Activity Tolerance: Patient tolerated treatment well Patient left: in chair;with call bell/phone within reach;with nursing/sitter in room;with family/visitor present Nurse Communication: Mobility status PT Visit Diagnosis: Other abnormalities of gait and mobility (R26.89);History of falling (Z91.81)    Time: [id number] PT Time Calculation (min) (ACUTE ONLY): 40 min   Charges:   PT Evaluation $PT Eval Moderate Complexity: 1 Mod PT Treatments $Gait Training: 8-22 mins $Therapeutic Activity: 8-22 mins        Bayard Males, PT Acute Rehabilitation Services Office: [phone]   Lennyn Gange B Otto Caraway 12/03/2022, 11:11 AM

## 2022-12-03 NOTE — Progress Notes (Signed)
Patient ID: Eric Gordon, male   DOB: [birth year], 89 y.o.   MRN: [id number] Doing well, appropriate back soreness, no leg pain or NTW. No need to wear brace in bed, only don brace while sitting or standing

## 2022-12-03 NOTE — Telephone Encounter (Signed)
Received voicemail earlier today from his wife, letting me know her was in the hospital and will be unable to attend PREP at this time. Called her back to confirm receipt of message. He will be eligible to attend PREP after he is discharged and medically cleared,

## 2022-12-03 NOTE — Consult Note (Cosign Needed Addendum)
ELECTROPHYSIOLOGY CONSULT NOTE    Patient ID: Eric Gordon MRN: [id number], DOB/AGE: 05-31-1931 89 y.o.  Admit date: 12/01/2022 Date of Consult: 12/03/2022  Primary Physician: Shon Baton, MD Primary Cardiologist: None  Electrophysiologist: Dr. Quentin Ore   Referring Provider: Dr. Renne Crigler  Patient Profile: Eric Gordon is a 89 y.o. male with a history of chronic systolic HF, VT with Biotronik ICD, CAD s/p CABG, Chronic anticoagulation with eliquis who is being seen today for the evaluation of syncope and VT at the request of Dr. Renne Crigler.  HPI:  Pt known to EP team with recent visit and device therapies, had close follow up planned for end of week to discuss AAD options.   Pt had cath 09/2022 with 3v CAD s/p 5V CABG with 2/5 patent bypass grafts. Planned for medical management with no targets for PCI.   Echo 10/30/2022 LVEF 45-50%, grade 1 DD.   Of note, pt had sudden onset chest pain 12/2018 and was admitted for left hydropneumothorax. Pt had + ESR and fluid was 70% EOS, amiodarone was discontinued at that time as potentially contributing.  Had recurrent effusions with gradually decreasing EOS, ultmiately had thoracoscopy and pleuradesis 03/2019.  Had previously discussed using Sotalol as he has had questionable toxicity on amiodarone.   Pt had VT treated by device on 3/7 associated with syncopal episode. Labs drawn (WNL) and toprol increased.     Pt had recurrent syncope early Saturday am getting up to urinate around 0300. Had immediate back pain. Called EMS and was taken to [facility]. Found to have T9 fracture and was transferred to [facility] for eval. Neurosurgery saw and brace put into place, no plan for surgical intervention at this time. With recent VT, cardiology -> EP consulted to assess and make recommendations given recent discussions involving AAD.   Today, he is feeling OK in bed. Back is sore. Wearing brace in bed. He states he stood up out of bed early Saturday am and had  syncope soon after. He did not make it into the bathroom.  His nephew is in the room has noticed frequent and increasing dizzy spells upon standing.  He did take his extra 12.5 mg of toprol of Friday night.  He denies chest pain, edema, or worsening SOB.   Labs Potassium4.1 (03/13 0019) Magnesium  1.9 (03/13 0019) Creatinine, ser  1.49* (03/13 0019) PLT  135* (03/13 0019) HGB  13.2 (03/13 0019) WBC 8.8 (03/13 0019) Troponin I (High Sensitivity)13 (03/11 1708).    Past Medical History:  Diagnosis Date   Abdominal aortic aneurysm (AAA) (HCC)    Achilles tendinitis of right lower extremity    AICD (automatic cardioverter/defibrillator) present    Arthritis    "my whole body" (03/19/2018)   Atherosclerosis of coronary artery bypass graft w/o angina pectoris    Atrial fibrillation (HCC)    Barrett's esophagus    CAD (coronary artery disease)    Carotid artery disease (HCC)    CHF (congestive heart failure) (HCC)    Chronic anticoagulation    PE   Chronic lower back pain    Dyspnea    Dysrhythmia    Essential tremor    GERD (gastroesophageal reflux disease)    High cholesterol    Hyperlipidemia    Hyperplasia, prostate    Hypertension    Hypothyroidism    Myocardial infarction ([place])    2 in Jan. 2019   Nail dystrophy    OSA on CPAP    uses CPAP   Osteoarthrosis  Pneumonia    "now and once before" (03/19/2018)   Pulmonary embolism [facility])    April 2012 after CABG   S/P CABG (coronary artery bypass graft)    Sleep apnea    Spontaneous pneumothorax    left spontaneous pneumothorax/left pleural effusion, s/p chest tube 01/09/19; thoracentesis x2, s/p left VATS/tacl pleurodesis 04/12/19     Surgical History:  Past Surgical History:  Procedure Laterality Date   ABDOMINAL AORTIC ENDOVASCULAR STENT GRAFT N/A 08/06/2022   Procedure: ABDOMINAL AORTIC ENDOVASCULAR STENT GRAFT;  Surgeon: Serafina Mitchell, MD;  Location: [place];  Service: Vascular;  Laterality: N/A;   ACHILLES TENDON  REPAIR Bilateral    2006 & 2004   CARDIAC CATHETERIZATION  11/2010   CORONARY ANGIOGRAPHY N/A 10/22/2018   Procedure: CORONARY ANGIOGRAPHY;  Surgeon: Troy Sine, MD;  Location: [place] CV LAB;  Service: Cardiovascular;  Laterality: N/A;   CORONARY ARTERY BYPASS GRAFT  March 2012   CABG X 5   ESOPHAGOGASTRODUODENOSCOPY (EGD) WITH PROPOFOL N/A 12/10/2020   Procedure: ESOPHAGOGASTRODUODENOSCOPY (EGD) WITH PROPOFOL;  Surgeon: Doran Stabler, MD;  Location: [facility] END[facility];  Service: Gastroenterology;  Laterality: N/A;   ICD IMPLANT N/A 02/25/2022   Procedure: ICD IMPLANT;  Surgeon: Vickie Epley, MD;  Location: [place] CV LAB;  Service: Cardiovascular;  Laterality: N/A;   INGUINAL HERNIA REPAIR Left 1980   INTRAVASCULAR PRESSURE WIRE/FFR STUDY N/A 10/22/2018   Procedure: INTRAVASCULAR PRESSURE WIRE/FFR STUDY;  Surgeon: Troy Sine, MD;  Location: [place] CV LAB;  Service: Cardiovascular;  Laterality: N/A;   IR THORACENTESIS ASP PLEURAL SPACE W/IMG GUIDE  02/04/2019   IR THORACENTESIS ASP PLEURAL SPACE W/IMG GUIDE  02/24/2019   KNEE ARTHROSCOPY Left 2006   LEFT HEART CATH AND CORS/GRAFTS ANGIOGRAPHY N/A 10/18/2018   Procedure: LEFT HEART CATH AND CORS/GRAFTS ANGIOGRAPHY;  Surgeon: Lorretta Harp, MD;  Location: [place] CV LAB;  Service: Cardiovascular;  Laterality: N/A;   LEFT HEART CATH AND CORS/GRAFTS ANGIOGRAPHY N/A 10/21/2018   Procedure: LEFT HEART CATH AND CORS/GRAFTS ANGIOGRAPHY;  Surgeon: Troy Sine, MD;  Location: [place] CV LAB;  Service: Cardiovascular;  Laterality: N/A;   PACEMAKER IMPLANT N/A 02/24/2022   Procedure: PACEMAKER IMPLANT;  Surgeon: Deboraha Sprang, MD;  Location: [place] CV LAB;  Service: Cardiovascular;  Laterality: N/A;   PENILE PROSTHESIS IMPLANT     PLEURAL BIOPSY Left 04/12/2019   Procedure: Pleural Biopsy;  Surgeon: Grace Isaac, MD;  Location: [place];  Service: Thoracic;  Laterality: Left;   PLEURAL EFFUSION DRAINAGE Left  04/12/2019   Procedure: Drainage Of Pleural Effusion;  Surgeon: Grace Isaac, MD;  Location: [place];  Service: Thoracic;  Laterality: Left;   RIGHT HEART CATHETERIZATION N/A 11/14/2013   Procedure: RIGHT HEART CATH;  Surgeon: Larey Dresser, MD;  Location: [facility] CATH LAB;  Service: Cardiovascular;  Laterality: N/A;   RIGHT/LEFT HEART CATH AND CORONARY/GRAFT ANGIOGRAPHY N/A 10/15/2022   Procedure: RIGHT/LEFT HEART CATH AND CORONARY/GRAFT ANGIOGRAPHY;  Surgeon: Burnell Blanks, MD;  Location: [place] CV LAB;  Service: Cardiovascular;  Laterality: N/A;   SHOULDER OPEN ROTATOR CUFF REPAIR Right 2003   TALC PLEURODESIS Left 04/12/2019   Procedure: Pietro Cassis;  Surgeon: Grace Isaac, MD;  Location: [place];  Service: Thoracic;  Laterality: Left;   TEE WITHOUT CARDIOVERSION N/A 03/24/2019   Procedure: TRANSESOPHAGEAL ECHOCARDIOGRAM (TEE);  Surgeon: Larey Dresser, MD;  Location: [facility] ENDOSCOPY;  Service: Cardiovascular;  Laterality: N/A;   TEE WITHOUT  CARDIOVERSION  04/12/2019   Procedure: Transesophageal Echocardiogram (Tee);  Surgeon: Grace Isaac, MD;  Location: [facility] OR;  Service: Thoracic;;   THORACOSCOPY Left 04/12/2019   VIDEO BRONCHOSCOPY (N/A ) VIDEO ASSISTED THORACOSCOPY (Left Chest) TALC PLEURADESIS (Left    TRANSCAROTID ARTERY REVASCULARIZATION  Right 09/13/2020   Procedure: RIGHT TRANSCAROTID ARTERY REVASCULARIZATION USING 54m X 454mENROUTE TRANSCAROTID STEND SYSTEM;  Surgeon: BrSerafina MitchellMD;  [place] [facility] Service: Vascular;  Laterality: Right;   TRANSCAROTID ARTERY REVASCULARIZATION  Left 02/08/2021   Procedure: LEFT TRANSCAROTID ARTERY REVASCULARIZATION;  Surgeon: BrSerafina MitchellMD;  [place] Service: Vascular;  Laterality: Left;   ULTRASOUND GUIDANCE FOR VASCULAR ACCESS  09/13/2020   Procedure: ULTRASOUND GUIDANCE FOR VASCULAR ACCESS;  Surgeon: BrSerafina MitchellMD;  [facility] Service: Vascular;;   ULTRASOUND GUIDANCE FOR VASCULAR  ACCESS Bilateral 08/06/2022   Procedure: ULTRASOUND GUIDANCE FOR VASCULAR ACCESS;  Surgeon: BrSerafina MitchellMD;  [facility] Service: Vascular;  Laterality: Bilateral;   VIDEO ASSISTED THORACOSCOPY Left 04/12/2019   Procedure: VIDEO ASSISTED THORACOSCOPY;  Surgeon: GeGrace IsaacMD;  [place] Service: Thoracic;  Laterality: Left;   VIDEO BRONCHOSCOPY N/A 04/12/2019   Procedure: VIDEO BRONCHOSCOPY;  Surgeon: GeGrace IsaacMD;  [facility];  Service: Thoracic;  Laterality: N/A;     Medications Prior to Admission  Medication Sig Dispense Refill Last Dose   apixaban (ELIQUIS) 5 MG TABS tablet Take 1 tablet (5 mg total) by mouth 2 (two) times daily. 60 tablet  12/01/2022   atorvastatin (LIPITOR) 80 MG tablet Take 1 tablet (80 mg total) by mouth daily. 30 tablet 6 12/01/2022   cetirizine (ZYRTEC) 10 MG tablet Take 10 mg by mouth daily.   12/01/2022   Cholecalciferol (VITAMIN D-3) 25 MCG (1000 UT) CAPS Take 1,000 Units by mouth in the morning.   12/01/2022   finasteride (PROSCAR) 5 MG tablet Take 5 mg by mouth daily.   12/01/2022   furosemide (LASIX) 20 MG tablet Take 1 tablet (20 mg total) by mouth daily. 90 tablet 3 12/01/2022   isosorbide mononitrate (IMDUR) 30 MG 24 hr tablet Take 0.5 tablets (15 mg total) by mouth daily.   12/01/2022   levothyroxine (SYNTHROID) 50 MCG tablet Take 50 mcg by mouth at bedtime.   12/01/2022   metoprolol succinate (TOPROL XL) 25 MG 24 hr tablet Take 1 tablet (25 mg total) by mouth daily.   12/01/2022   metoprolol succinate (TOPROL XL) 25 MG 24 hr tablet Take 0.5 tablets (12.5 mg total) by mouth daily. Take 12.'5mg'$  12 hours apart from '25mg'$  tablet dose 30 tablet 1 12/01/2022   mexiletine (MEXITIL) 150 MG capsule Take 1 capsule (150 mg total) by mouth 2 (two) times daily. 180 capsule 2 12/01/2022   midodrine (PROAMATINE) 2.5 MG tablet Take 1 tablet (2.5 mg total) by mouth 2 (two) times daily with a meal. 180 tablet 3 12/01/2022   Multiple Vitamin  (MULTIVITAMIN WITH MINERALS) TABS tablet Take 1 tablet by mouth in the morning.   12/01/2022   pantoprazole (PROTONIX) 40 MG tablet Take 40 mg by mouth daily before breakfast.   12/01/2022   spironolactone (ALDACTONE) 25 MG tablet Take 0.5 tablets (12.5 mg total) by mouth at bedtime. 15 tablet 2 12/01/2022   tamsulosin (FLOMAX) 0.4 MG CAPS capsule Take 0.4 mg by mouth every evening.   12/01/2022   traMADol (ULTRAM) 50 MG tablet Take 50 mg by mouth every 6 (  six) hours as needed for moderate pain.   12/01/2022    Inpatient Medications:   acetaminophen  650 mg Oral Q6H   apixaban  5 mg Oral BID   atorvastatin  80 mg Oral Daily   cholecalciferol  1,000 Units Oral q AM   finasteride  5 mg Oral Daily   isosorbide mononitrate  15 mg Oral Daily   levothyroxine  50 mcg Oral q AM   loratadine  10 mg Oral Daily   metoprolol succinate  12.5 mg Oral QHS   metoprolol succinate  25 mg Oral Daily   mexiletine  150 mg Oral BID   midodrine  2.5 mg Oral BID WC   multivitamin with minerals  1 tablet Oral q AM   pantoprazole  40 mg Oral QAC breakfast   tamsulosin  0.4 mg Oral QPM    Allergies:  Allergies  Allergen Reactions   Ancef [Cefazolin Sodium] Hives   Empagliflozin Other (See Comments)    Dizziness   Vancomycin Rash    Family History  Problem Relation Age of Onset   Coronary artery disease Mother    Hypertension Mother    Heart disease Mother      Physical Exam: Vitals:   12/02/22 1934 12/02/22 2317 12/03/22 0416 12/03/22 0816  BP: 127/86 107/64 116/61 111/63  Pulse: 91 85 71 64  Resp: '20 18 18 19  '$ Temp: 98.1 F (36.7 C) 97.7 F (36.5 C) 97.6 F (36.4 C) (!) 97.5 F (36.4 C)  TempSrc: Oral Oral Oral Oral  SpO2: 93% 95% 92% 96%  Weight:      Height:        GEN- NAD, A&O x 3, normal affect HEENT: Normocephalic, atraumatic Lungs- CTAB, Normal effort.  Heart- Regular rate and rhythm, No M/G/R.  GI- Soft, NT, ND.  Extremities- No clubbing, cyanosis, or edema    Radiology/Studies:  DG Thoracic Spine 2 View 12/01/22 Patient's known T9 vertebral body fracture not well on this study.   CT Angio Abd/Pel W and/or Wo Contrast 12/01/22 1. Prior graft repair of abdominal aortic aneurysm with persistent type 2 endoleak and stable aneurysm sac diameter of 5.0 cm.  2. Small left pleural effusion with bibasilar atelectasis.  3. Severe diverticulosis with no evidence of diverticulitis.  4. Numerous bladder trabeculations, likely due to chronic outlet obstruction.   CT Chest W Contrast 12/01/22 1. T9 fracture or with horizontal orientation through the vertebral body extending posteriorly into the vertebral body without definitive signs of posterior element involvement but with appearance that is highly suspicious for hyperextension injury. Multilevel spinal fusion seen throughout the thoracic spine. Type of injury seen is at risk for developing pseudoarthrosis. Spinal MRI may be helpful to exclude any associated ligamentous injuries or other findings that would suggest instability though alignment of posterior elements currently is normal.  2. 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 This configuration shows increased risk of rupture and complication but is unchanged compared to prior imaging.  3. Signs of prior talc pleurodesis and small LEFT-sided effusion.  4. Aortic atherosclerosis with coronary revascularization. Aortic Atherosclerosis   CT Head Wo Contrast 12/01/22 1. No acute intracranial abnormality.  2. No acute cervical spine fracture or traumatic listhesis.  3. Redemonstrated left common carotid artery stent with narrowing of the stent lumen in the midportion, unchanged from prior exam.   CT Cervical Spine Wo Contrast 12/01/22 1. No acute intracranial abnormality.  2. No acute cervical spine fracture or traumatic listhesis.  3. Redemonstrated left  common carotid artery stent with narrowing of the stent lumen in the midportion, unchanged from  prior exam.   DG Chest 1 View 12/01/22  No active disease.   EKG: on arrival shows A pacing at 80 bpm, NSR (personally reviewed)  TELEMETRY: NSR 60-80s (personally reviewed)  DEVICE HISTORY:  Biotronik Dual Chamber ICD implanted 02/25/2022 for VT and ICM History of appropriate therapy: Yes History of AAD therapy: Previously taken off amiodarone with concerns for pulm tox (for AF)  Assessment/Plan: Syncope VT No further VT on ICD interrogation. Episode 3/7 previously noted, no episode associated with fall 3/9. Continue toprol 25 mg daily, increased tolerated Potassium4.1 (03/13 0019) Magnesium  1.9 (03/13 0019) Creatinine, ser  1.49* (03/13 0019) Keep K > 4.0 and Mg > 2.0  Discussed AAD options at length with pt. Discussed with Dr. Quentin Ore and we no longer think Sotalol is best option for him. We would recommend re-challenging with low dose amiodarone and stopping his mexiletine.  Unfortunately, he has a quite advanced cardiomyopathy and few options for escalation of treatment. Would potentially recommend starting amiodarone 200 mg BID x 2 weeks then decreasing to 200 mg daily. Can adjust as needed.    HFrmEF ICM Echo 45-50% 10/2022 GDMT significantly limited by hypotension Would titrate midodrine up as needed/tolerated.  CAD Cath 09/2022 with 2 of 5 patent grafts  PAF Continue eliquis for CHA2DS2VASc of at least 4 Well controlled overall.   Orthostasis Has had several falls and near falls NOT associated with VT.  Nephew feels this is getting worse.  He is only on 2.5 mg of midodrine. Would titrate as needed.   GOC Given his advanced ages and significant co-morbidities   Dr. Myles Gip has seen and agrees with plan above.  EP will see as needed while remains here. Please call with questions. Outpatient follow up re-scheduled.   For questions or updates, please contact [place] Please consult [email or web address] for contact info under Cardiology/STEMI.  Eric Lefevre, PA-C  12/03/2022 8:38 AM

## 2022-12-03 NOTE — Progress Notes (Signed)
PROGRESS NOTE  Eric Gordon [id number] DOB: [birth year] DOA: 12/01/2022 PCP: Shon Baton, MD   LOS: 0 days   Brief Narrative / Interim history: 89 year old male with PAF, VT, CAD status post CABG, CHF status post ICD comes into the hospital for several syncopal episodes with concern for VT.  Cardiology/EP consulted  Subjective / 24h Interval events: Doing well this morning, no chest discomfort, no shortness of breath.  No palpitations  Assesement and Plan: Principal Problem:   Syncope Active Problems:   T9 vertebral fracture (HCC)   Chronic HFrEF (heart failure with reduced ejection fraction) (HCC)   Essential hypertension   CAD s/p CABG 3/12    Persistent atrial fibrillation (HCC)   CKD (chronic kidney disease), stage III (HCC)   Hypothyroidism   Hyperlipidemia   Principal problem Syncope with concern for VT -appreciate cardiology follow-up, he had VT treated by device on 3/7 associated with syncopal episode.  He has not further VT on ICD interrogation.  He is fall from 3/9 did not appear to be associated with VT.  Continue Toprol, increased to 25 mg.  Closely monitor for hypotension/orthostasis, may need to increase in midodrine.  Started on amiodarone  Active problems History of CAD status post CABG -this appears stable, no chest pain.  Continue statin, beta-blockers, Imdur  T9 vertebral fracture (HCC) - Recommended conservative not operative treatment.  Neurosurgery evaluated patient recommending TLSO brace when up.  Working with PT today.  Continue pain control  Chronic HFrEF (heart failure with reduced ejection fraction) (HCC) - No clinical signs of exacerbation.  He appears euvolemic.  Most recent 2D echo in February 2024 with EF 45-50%.  Goal-directed medical therapy limited by hypotension   Essential hypertension - Continue midodrine for blood pressure support.    Persistent atrial fibrillation (HCC) - Continue rate control with metoprolol and anticoagulation  with apixaban.  Now on amiodarone per cardiology. Currently atrial paced rhythm.    CKD (chronic kidney disease), stage IIIa (HCC) -creatinine appears at baseline    Hypothyroidism - Continue with levothyroxine   Hyperlipidemia - Continue with statin therapy.   Scheduled Meds:  acetaminophen  650 mg Oral Q6H   amiodarone  200 mg Oral BID   Followed by   Derrill Memo ON 12/17/2022] amiodarone  200 mg Oral Daily   apixaban  5 mg Oral BID   atorvastatin  80 mg Oral Daily   cholecalciferol  1,000 Units Oral q AM   finasteride  5 mg Oral Daily   isosorbide mononitrate  15 mg Oral Daily   levothyroxine  50 mcg Oral q AM   loratadine  10 mg Oral Daily   metoprolol succinate  12.5 mg Oral QHS   metoprolol succinate  25 mg Oral Daily   midodrine  2.5 mg Oral BID WC   multivitamin with minerals  1 tablet Oral q AM   pantoprazole  40 mg Oral QAC breakfast   senna-docusate  2 tablet Oral BID   tamsulosin  0.4 mg Oral QPM   Continuous Infusions: PRN Meds:.cyclobenzaprine, HYDROmorphone (DILAUDID) injection, ondansetron **OR** ondansetron (ZOFRAN) IV, oxyCODONE  Current Outpatient Medications  Medication Instructions   apixaban (ELIQUIS) 5 mg, Oral, 2 times daily   atorvastatin (LIPITOR) 80 mg, Oral, Daily   cetirizine (ZYRTEC) 10 mg, Oral, Daily   finasteride (PROSCAR) 5 mg, Oral, Daily   furosemide (LASIX) 20 mg, Oral, Daily   isosorbide mononitrate (IMDUR) 15 mg, Oral, Daily   levothyroxine (SYNTHROID) 50 mcg, Oral, Nightly   metoprolol  succinate (TOPROL XL) 25 mg, Oral, Daily   metoprolol succinate (TOPROL XL) 12.5 mg, Oral, Daily, Take 12.'5mg'$  12 hours apart from '25mg'$  tablet dose   mexiletine (MEXITIL) 150 mg, Oral, 2 times daily   midodrine (PROAMATINE) 2.5 mg, Oral, 2 times daily with meals   Multiple Vitamin (MULTIVITAMIN WITH MINERALS) TABS tablet 1 tablet, Oral, Every morning   pantoprazole (PROTONIX) 40 mg, Oral, Daily before breakfast   spironolactone (ALDACTONE) 12.5 mg, Oral,  Nightly   tamsulosin (FLOMAX) 0.4 mg, Oral, Every evening   traMADol (ULTRAM) 50 mg, Oral, Every 6 hours PRN   Vitamin D-3 1,000 Units, Oral, Every morning    Diet Orders (From admission, onward)     Start     Ordered   12/02/22 1617  Diet Heart Room service appropriate? Yes; Fluid consistency: Thin  Diet effective now       Question Answer Comment  Room service appropriate? Yes   Fluid consistency: Thin      12/02/22 1616            DVT prophylaxis: SCDs Start: 12/02/22 1616 apixaban (ELIQUIS) tablet 5 mg   Lab Results  Component Value Date   PLT 135 (L) 12/03/2022      Code Status: Full Code  Family Communication: nephew at bedside   Status is: Inpatient  Level of care: Telemetry Cardiac  Consultants:  Cardiology   Objective: Vitals:   12/03/22 0816 12/03/22 0926 12/03/22 1236 12/03/22 1305  BP: 111/63 (!) 115/54 (!) 110/96 102/60  Pulse: 64 70 72 71  Resp: 19 (!) 21 17 (!) 21  Temp: (!) 97.5 F (36.4 C) 97.7 F (36.5 C) 98.3 F (36.8 C)   TempSrc: Oral Oral Oral   SpO2: 96% 90% 96% 97%  Weight:      Height:        Intake/Output Summary (Last 24 hours) at 12/03/2022 1449 Last data filed at 12/03/2022 0602 Gross per 24 hour  Intake --  Output 500 ml  Net -500 ml   Wt Readings from Last 3 Encounters:  12/02/22 94.4 kg  11/04/22 92.5 kg  10/30/22 91.7 kg    Examination:  Constitutional: NAD Eyes: no scleral icterus ENMT: Mucous membranes are moist.  Neck: normal, supple Respiratory: clear to auscultation bilaterally, no wheezing, no crackles. Normal respiratory effort. No accessory muscle use.  Cardiovascular: Regular rate and rhythm, 3/6 SEM.  No edema Abdomen: non distended, no tenderness. Bowel sounds positive.  Musculoskeletal: no clubbing / cyanosis.   Data Reviewed: I have independently reviewed following labs and imaging studies   CBC Recent Labs  Lab 12/01/22 1552 12/03/22 0019  WBC 10.0 8.8  HGB 13.4 13.2  HCT 42.0 40.5   PLT 150 135*  MCV 95.2 95.3  MCH 30.4 31.1  MCHC 31.9 32.6  RDW 14.7 14.6  LYMPHSABS 1.1  --   MONOABS 1.2*  --   EOSABS 0.2  --   BASOSABS 0.0  --     Recent Labs  Lab 11/28/22 1207 12/01/22 1552 12/03/22 0019  NA 141 137 135  K 4.6 4.5 4.1  CL 101 101 101  CO2 '24 27 24  '$ GLUCOSE 85 113* 135*  BUN 33* 37* 34*  CREATININE 1.92* 1.43* 1.49*  CALCIUM 9.4 9.5 9.2  AST 21 16  --   ALT 15 11  --   ALKPHOS 117 72  --   BILITOT 0.5 0.9  --   ALBUMIN 4.1 3.5  --   MG 1.8  --  1.9  INR  --  1.4*  --   TSH 4.040  --   --   BNP  --  284.1*  --     ------------------------------------------------------------------------------------------------------------------ No results for input(s): "CHOL", "HDL", "LDLCALC", "TRIG", "CHOLHDL", "LDLDIRECT" in the last 72 hours.  Lab Results  Component Value Date   HGBA1C (H) 12/06/2010    5.8 (NOTE)                                                                       According to the ADA Clinical Practice Recommendations for 2011, when HbA1c is used as a screening test:   >=6.5%   Diagnostic of Diabetes Mellitus           (if abnormal result  is confirmed)  5.7-6.4%   Increased risk of developing Diabetes Mellitus  References:Diagnosis and Classification of Diabetes Mellitus,Diabetes Care,2011,34(Suppl 1):S62-S69 and Standards of Medical Care in         Diabetes - 2011,Diabetes [id number]  (Suppl 1):S11-S61.   ------------------------------------------------------------------------------------------------------------------ No results for input(s): "TSH", "T4TOTAL", "T3FREE", "THYROIDAB" in the last 72 hours.  Invalid input(s): "FREET3"  Cardiac Enzymes No results for input(s): "CKMB", "TROPONINI", "MYOGLOBIN" in the last 168 hours.  Invalid input(s): "CK" ------------------------------------------------------------------------------------------------------------------    Component Value Date/Time   BNP 284.1 (H) 12/01/2022 1552    BNP 81.4 06/24/2011 1657    CBG: No results for input(s): "GLUCAP" in the last 168 hours.  Recent Results (from the past 240 hour(s))  MRSA Next Gen by PCR, Nasal     Status: None   Collection Time: 12/02/22  3:47 PM   Specimen: Nasal Mucosa; Nasal Swab  Result Value Ref Range Status   MRSA by PCR Next Gen NOT DETECTED NOT DETECTED Final    Comment: (NOTE) The GeneXpert MRSA Assay (FDA approved for NASAL specimens only), is one component of a comprehensive MRSA colonization surveillance program. It is not intended to diagnose MRSA infection nor to guide or monitor treatment for MRSA infections. Test performance is not FDA approved in patients less than 68 years old. Performed at [facility], [place] [address] [place], [place] [address]      Radiology Studies: No results found.   Marzetta Board, MD, PhD [facility]  Between 7 am - 7 pm I am available, please contact me via Amion (for emergencies) or Securechat (non urgent messages)  Between 7 pm - 7 am I am not available, please contact night coverage MD/APP via Amion

## 2022-12-03 NOTE — Consult Note (Signed)
[place] Nurse Consult Note: Reason for Consult:trauma from fall.  Skin tear to left forearm and anterior head.  Wound type:trauma Pressure Injury POA: NA Measurement: Head;  dry scabbed abrasion  8 cm in length Forearm:  Xeroform and tegaderm to skin tear, placed on at [place] 3/12.  Due to thin skin, this will not be removed until 3/15 as the adhesive will likely cause additional trauma and the dressing in place is clean dry intact and appropriate.  Son is at bedside, he has seen the wound and agrees.  Wound bed: skin flap was removed during trauma.  Unable to be approximated, per son.  Drainage (amount, consistency, odor) some serosanguinous drainage noted to left forearm wound.  None to head abrasion.  Periwound:  bruising, thin skin Dressing procedure/placement/frequency: Head abrasion, open to air.  Left forearm:  cleanse with NS and pat dry. Apply Xeroform gauze to wound, covered with tegaderm.  Will change again 12/05/22 and secure dressing with kerlix and tape.  No tape on skin.  Will not follow at this time.  Please re-consult if needed.  Estrellita Ludwig MSN, RN, FNP-BC CWON Wound, Ostomy, Continence Nurse [facility] [phone] Pager [phone]

## 2022-12-04 DIAGNOSIS — R55 Syncope and collapse: Secondary | ICD-10-CM | POA: Diagnosis not present

## 2022-12-04 MED ORDER — AMIODARONE HCL 200 MG PO TABS: ORAL_TABLET | ORAL | 0 refills | Status: DC

## 2022-12-04 MED ORDER — OXYCODONE HCL 5 MG PO TABS: 5.0000 mg | ORAL_TABLET | ORAL | 0 refills | Status: DC | PRN

## 2022-12-04 MED ORDER — MAGNESIUM SULFATE 2 GM/50ML IV SOLN
2.0000 g | Freq: Once | INTRAVENOUS | Status: AC
  Administered 2022-12-04: 2 g via INTRAVENOUS
  Filled 2022-12-04: qty 50

## 2022-12-04 NOTE — Progress Notes (Signed)
Physical Therapy Treatment Patient Details Name: Eric Gordon MRN: [id number] DOB: 26-Oct-1930 Today's Date: 12/04/2022   History of Present Illness 89 yo admitted 3/11 after several episodes of syncope with T9 fx. PMHx: CAD s/p CABG, ICD, PAF, ICM, CHF, GERD, HLD, HTN    PT Comments    Pt pleasant with wife, son and nephew present with wife present and assisting with donning brace with min assist. Pt educated for precautions, activity progression, tub transfer, gait and brace wear. Pt continues to need reinforcement for precautions and safety but progressing with gait tolerance. Plan remains appropriate.   Pt maintained >93% on RA throughout session HR 105 with gait    Recommendations for follow up therapy are one component of a multi-disciplinary discharge planning process, led by the attending physician.  Recommendations may be updated based on patient status, additional functional criteria and insurance authorization.  Follow Up Recommendations  Home health PT     Assistance Recommended at Discharge Intermittent Supervision/Assistance  Patient can return home with the following A little help with walking and/or transfers;A lot of help with bathing/dressing/bathroom;Assistance with cooking/housework;Assist for transportation   Equipment Recommendations  None recommended by PT    Recommendations for Other Services       Precautions / Restrictions Precautions Precautions: Back;Fall Spinal Brace: Thoracolumbosacral orthotic;Applied in sitting position     Mobility  Bed Mobility Overal bed mobility: Needs Assistance Bed Mobility: Rolling, Sidelying to Sit Rolling: Supervision Sidelying to sit: Min guard       General bed mobility comments: cues for sequence, increased time with HOB 10 degrees and reliance on rail to rise    Transfers Overall transfer level: Needs assistance   Transfers: Sit to/from Stand Sit to Stand: Min guard           General  transfer comment: cues for hand placement    Ambulation/Gait Ambulation/Gait assistance: Min guard Gait Distance (Feet): 150 Feet Assistive device: Rolling walker (2 wheels) Gait Pattern/deviations: Step-through pattern, Decreased stride length   Gait velocity interpretation: 1.31 - 2.62 ft/sec, indicative of limited community ambulator   General Gait Details: cues for proximity to RW and posture, pt self-regulating distance with increased tolerance   Stairs             Wheelchair Mobility    Modified Rankin (Stroke Patients Only)       Balance Overall balance assessment: Needs assistance Sitting-balance support: No upper extremity supported, Feet supported Sitting balance-Leahy Scale: Good     Standing balance support: Bilateral upper extremity supported, Reliant on assistive device for balance Standing balance-Leahy Scale: Poor Standing balance comment: Rw for standing and gait                            Cognition Arousal/Alertness: Awake/alert Behavior During Therapy: WFL for tasks assessed/performed Overall Cognitive Status: Impaired/Different from baseline Area of Impairment: Memory                     Memory: Decreased recall of precautions                  Exercises      General Comments        Pertinent Vitals/Pain Pain Assessment Pain Score: 5  Pain Location: thoracic spine Pain Descriptors / Indicators: Aching, Sore Pain Intervention(s): Limited activity within patient's tolerance, Repositioned, Monitored during session, Premedicated before session    Home Living  Prior Function            PT Goals (current goals can now be found in the care plan section) Progress towards PT goals: Progressing toward goals    Frequency    Min 3X/week      PT Plan Current plan remains appropriate    Co-evaluation              AM-PAC PT "6 Clicks" Mobility   Outcome  Measure  Help needed turning from your back to your side while in a flat bed without using bedrails?: A Little Help needed moving from lying on your back to sitting on the side of a flat bed without using bedrails?: A Little Help needed moving to and from a bed to a chair (including a wheelchair)?: A Little Help needed standing up from a chair using your arms (e.g., wheelchair or bedside chair)?: A Little Help needed to walk in hospital room?: A Little Help needed climbing 3-5 steps with a railing? : A Lot 6 Click Score: 17    End of Session Equipment Utilized During Treatment: Gait belt;Back brace Activity Tolerance: Patient tolerated treatment well Patient left: in chair;with call bell/phone within reach;with family/visitor present Nurse Communication: Mobility status PT Visit Diagnosis: Other abnormalities of gait and mobility (R26.89);History of falling (Z91.81)     Time: [id number] PT Time Calculation (min) (ACUTE ONLY): 25 min  Charges:  $Gait Training: 8-22 mins $Therapeutic Activity: 8-22 mins                     Bayard Males, PT Acute Rehabilitation Services Office: [place] 12/04/2022, 1:44 PM

## 2022-12-04 NOTE — Consult Note (Signed)
   [facility] CM Inpatient Consult   12/04/2022  Eric Gordon [birth year] [id number]   [place] Organization [ACO] Patient: Medicare ACO REACH  Primary Care Provider:  Shon Baton, MD with [facility]   Patient has been outreach with  [facility] team to start PREP services.  Patient has been engaged by a  Microbiologist    Plan: PREP RN is aware of admission, patient to transition home today and recommended for home with [facility], unable to reach patient at this time by hospital phone.  Will follow as patient is for transitioning likely today. No other care coordination needs noted.  Of note, [facility] Care Management services does not replace or interfere with any services that are needed or arranged by inpatient [facility] care management team.   For additional questions or referrals please contact:  Natividad Brood, RN BSN [place]  [phone] business mobile phone Toll free office [phone]  *[place]  [phone] Fax number: [phone] [email or web address]    .

## 2022-12-04 NOTE — Progress Notes (Signed)
Discharge instructions reviewed with pt, wife, son and nephew.  Copy of instructions given to pt/wife. Informed scripts sent to his pharmacy for pick up.  Pt and family verbalized understanding of all instructions. Questions answered.  Pt d/c'd via wheelchair with belongings, with family.            Escorted by unit NT.

## 2022-12-04 NOTE — TOC Progression Note (Addendum)
Transition of Care (TOC) - Progression Note   Updated Tommi Rumps with [facility] Lemmings anticipated discharge date is today   TOC awaiting oxygen saturation ambulation note to see if home oxygen needed.  Patient Details  Name: Eric Gordon MRN: [id number] Date of Birth: Jun 02, 1931  Transition of Care [facility]) CM/SW Contact  Shatarra Wehling, Edson Snowball, RN Phone Number: 12/04/2022, 11:16 AM  Clinical Narrative:       Expected Discharge Plan: [place] Barriers to Discharge: Continued Medical Work up  Expected Discharge Plan and Services   Discharge Planning Services: CM Consult Post Acute Care Choice: [place] arrangements for the past 2 months: Single Family Home                           HH Arranged: PT, OT HH Agency: [place] Date [place]: 12/03/22 Time [place]: 1326 Representative spoke with at [place]: [place] Determinants of Health ([place]) Interventions SDOH Screenings   Food Insecurity: No Food Insecurity (12/02/2022)  Housing: Low Risk  (12/02/2022)  Transportation Needs: No Transportation Needs (12/02/2022)  Utilities: Not At Risk (12/02/2022)  Depression (PHQ2-9): Low Risk  (03/16/2019)  Tobacco Use: Medium Risk (12/01/2022)    Readmission Risk Interventions     No data to display

## 2022-12-04 NOTE — Discharge Instructions (Addendum)
If you have constipation at home, please read the attached document regarding diet. If constipation persists, there are plenty of options over the counter such as Miralax, Sennokot S, Dulcolax, Colace, Magnesium Citrate to name a few. Each patient is different and not all of these might work for you and you may need to try more than one before you find out which one if the best for you. If you decide to try, please carefully read the administration instructions   Follow with Shon Baton, MD in 5-7 days  Please get a complete blood count and chemistry panel checked by your Primary MD at your next visit, and again as instructed by your Primary MD. Please get your medications reviewed and adjusted by your Primary MD.  Please request your Primary MD to go over all Hospital Tests and Procedure/Radiological results at the follow up, please get all Hospital records sent to your Prim MD by signing hospital release before you go home.  In some cases, there will be blood work, cultures and biopsy results pending at the time of your discharge. Please request that your primary care M.D. goes through all the records of your hospital data and follows up on these results.  If you had Pneumonia of Lung problems at the Hospital: Please get a 2 view Chest X ray done in 6-8 weeks after hospital discharge or sooner if instructed by your Primary MD.  If you have Congestive Heart Failure: Please call your Cardiologist or Primary MD anytime you have any of the following symptoms:  1) 3 pound weight gain in 24 hours or 5 pounds in 1 week  2) shortness of breath, with or without a dry hacking cough  3) swelling in the hands, feet or stomach  4) if you have to sleep on extra pillows at night in order to breathe  Follow cardiac low salt diet and 1.5 lit/day fluid restriction.  If you have diabetes Accuchecks 4 times/day, Once in AM empty stomach and then before each meal. Log in all results and show them to your  primary doctor at your next visit. If any glucose reading is under 80 or above 300 call your primary MD immediately.  If you have Seizure/Convulsions/Epilepsy: Please do not drive, operate heavy machinery, participate in activities at heights or participate in high speed sports until you have seen by Primary MD or a Neurologist and advised to do so again. Per [facility] statutes, patients with seizures are not allowed to drive until they have been seizure-free for six months.  Use caution when using heavy equipment or power tools. Avoid working on ladders or at heights. Take showers instead of baths. Ensure the water temperature is not too high on the home water heater. Do not go swimming alone. Do not lock yourself in a room alone (i.e. bathroom). When caring for infants or small children, sit down when holding, feeding, or changing them to minimize risk of injury to the child in the event you have a seizure. Maintain good sleep hygiene. Avoid alcohol.   If you had Gastrointestinal Bleeding: Please ask your Primary MD to check a complete blood count within one week of discharge or at your next visit. Your endoscopic/colonoscopic biopsies that are pending at the time of discharge, will also need to followed by your Primary MD.  Get Medicines reviewed and adjusted. Please take all your medications with you for your next visit with your Primary MD  Please request your Primary MD to go over  all hospital tests and procedure/radiological results at the follow up, please ask your Primary MD to get all Hospital records sent to his/her office.  If you experience worsening of your admission symptoms, develop shortness of breath, life threatening emergency, suicidal or homicidal thoughts you must seek medical attention immediately by calling 911 or calling your MD immediately  if symptoms less severe.  You must read complete instructions/literature along with all the possible adverse reactions/side  effects for all the Medicines you take and that have been prescribed to you. Take any new Medicines after you have completely understood and accpet all the possible adverse reactions/side effects.   Do not drive or operate heavy machinery when taking Pain medications.   Do not take more than prescribed Pain, Sleep and Anxiety Medications  Special Instructions: If you have smoked or chewed Tobacco  in the last 2 yrs please stop smoking, stop any regular Alcohol  and or any Recreational drug use.  Wear Seat belts while driving.  Please note You were cared for by a hospitalist during your hospital stay. If you have any questions about your discharge medications or the care you received while you were in the hospital after you are discharged, you can call the unit and asked to speak with the hospitalist on call if the hospitalist that took care of you is not available. Once you are discharged, your primary care physician will handle any further medical issues. Please note that NO REFILLS for any discharge medications will be authorized once you are discharged, as it is imperative that you return to your primary care physician (or establish a relationship with a primary care physician if you do not have one) for your aftercare needs so that they can reassess your need for medications and monitor your lab values.  You can reach the hospitalist office at phone [phone] or fax [phone]   If you do not have a primary care physician, you can call [phone] for a physician referral.  Activity: As tolerated with Full fall precautions use walker/cane & assistance as needed    Diet: regular  Disposition Home

## 2022-12-04 NOTE — Plan of Care (Signed)

## 2022-12-04 NOTE — Discharge Summary (Signed)
Physician Discharge Summary  Eric Gordon [id number] DOB: 19-Jan-1931 DOA: 12/01/2022  PCP: Shon Baton, MD  Admit date: 12/01/2022 Discharge date: 12/04/2022  Admitted From: home Disposition:  home  Recommendations for Outpatient Follow-up:  Follow up with PCP in 1-2 weeks Follow-up with neurosurgery and cardiology in 2 to 3 weeks  Home Health: PT Equipment/Devices: none  Discharge Condition: stable CODE STATUS: Full code Diet Orders (From admission, onward)     Start     Ordered   12/02/22 1617  Diet Heart Room service appropriate? Yes; Fluid consistency: Thin  Diet effective now       Question Answer Comment  Room service appropriate? Yes   Fluid consistency: Thin      12/02/22 1616            HPI: Per admitting MD, Eric Gordon is a 89 y.o. male with medical history significant of atrial fibrillation, coronary artery disease, dyslipidemia, heart failure with ventricular tachycardia sp ICD who presented after a mechanical fall.  Patient has been experiencing orthostatic symptoms for several months. His last evaluation per EP was on 02/13 by Dr Quentin Ore, with recommendations to continue with mexiletine. He has history of receiving therapies from his ICD in the past. 4 days prior to presenting to he ED he developed a syncope episode, apparently he was using the vacuum cleaner when he loss his consciousness, no warning or prodromal symptoms. He recovered his consciousness fairly rapid, he woke up on the floor with pain on his left side. After the event he received a call from his cardiologist office, apparently alerted by the cardiac device and was instructed to get blood work. He got blood work the following day, but has not received the report yet. The following day, 2 days prior to coming to the ED, he woke up at 3:00 am to go to the bathroom and suffered another syncope episode, this time with trauma to his head and back. When he woke up he had pain to his  back, that since then has been worsening and has limited his mobility. Pain is located at the lower upper back, 10/10 intensity, worse with movement and improved with immobility, no radiation and no associated symptoms.  Because persistent pain he was brought to [facility] ED, there he was diagnosed with  T9 fracture. Neurosurgery was consulted with recommendations for conservative non operative treatment.   Hospital Course / Discharge diagnoses: Principal Problem:   Syncope Active Problems:   T9 vertebral fracture (HCC)   Chronic HFrEF (heart failure with reduced ejection fraction) (HCC)   Essential hypertension   CAD s/p CABG 3/12    Persistent atrial fibrillation (HCC)   CKD (chronic kidney disease), stage III (HCC)   Hypothyroidism   Hyperlipidemia   Syncope, cardiogenic   Principal problem Syncope with concern for VT -appreciate cardiology follow-up, he had VT treated by device on 3/7 associated with syncopal episode.  He has not further VT on ICD interrogation.  He is fall from 3/9 did not appear to be associated with VT. cardiology recommended patient be started on amiodarone, he tolerated that well.  He has not had any significant arrhythmias on telemetry.  Discussed with EP, he will be discharged home in stable condition, he is to continue amiodarone 200 mg twice daily for the next 2 weeks, then amiodarone 200 mg once daily.  He is to continue his metoprolol as well.  There was a concern about him having amiodarone toxicity in the past and this  will be monitored closely as an outpatient.   Active problems History of CAD status post CABG -this appears stable, no chest pain.  Continue statin, beta-blockers, Imdur T9 vertebral fracture (HCC) - Recommended conservative not operative treatment.  Neurosurgery evaluated patient recommending TLSO brace when up.  He is able to work with physical therapy, pain is controlled on oral agents, will be discharged home in stable condition with outpatient  neurosurgery follow-up  Chronic HFrEF (heart failure with reduced ejection fraction) (HCC) - No clinical signs of exacerbation.  He appears euvolemic.  Most recent 2D echo in February 2024 with EF 45-50%.  Goal-directed medical therapy limited by hypotension Essential hypertension - Continue midodrine for blood pressure support.  Persistent atrial fibrillation (HCC) - Continue rate control with metoprolol and anticoagulation with apixaban.  Now on amiodarone per cardiology. Currently atrial paced rhythm.  CKD (chronic kidney disease), stage IIIa (HCC) -creatinine appears at baseline Hypothyroidism - Continue with levothyroxine Hyperlipidemia - Continue with statin therapy.   Sepsis ruled out   Discharge Instructions   Allergies as of 12/04/2022       Reactions   Ancef [cefazolin Sodium] Hives   Empagliflozin Other (See Comments)   Dizziness   Vancomycin Rash        Medication List     STOP taking these medications    mexiletine 150 MG capsule Commonly known as: MEXITIL   traMADol 50 MG tablet Commonly known as: ULTRAM       TAKE these medications    amiodarone 200 MG tablet Commonly known as: PACERONE Twice 1 tablet twice daily for 2 weeks, then take 1 tablet once daily.   apixaban 5 MG Tabs tablet Commonly known as: ELIQUIS Take 1 tablet (5 mg total) by mouth 2 (two) times daily.   atorvastatin 80 MG tablet Commonly known as: LIPITOR Take 1 tablet (80 mg total) by mouth daily.   cetirizine 10 MG tablet Commonly known as: ZYRTEC Take 10 mg by mouth daily.   finasteride 5 MG tablet Commonly known as: PROSCAR Take 5 mg by mouth daily.   furosemide 20 MG tablet Commonly known as: LASIX Take 1 tablet (20 mg total) by mouth daily.   isosorbide mononitrate 30 MG 24 hr tablet Commonly known as: IMDUR Take 0.5 tablets (15 mg total) by mouth daily.   levothyroxine 50 MCG tablet Commonly known as: SYNTHROID Take 50 mcg by mouth at bedtime.   metoprolol  succinate 25 MG 24 hr tablet Commonly known as: Toprol XL Take 1 tablet (25 mg total) by mouth daily.   metoprolol succinate 25 MG 24 hr tablet Commonly known as: Toprol XL Take 0.5 tablets (12.5 mg total) by mouth daily. Take 12.'5mg'$  12 hours apart from '25mg'$  tablet dose   midodrine 2.5 MG tablet Commonly known as: PROAMATINE Take 1 tablet (2.5 mg total) by mouth 2 (two) times daily with a meal.   multivitamin with minerals Tabs tablet Take 1 tablet by mouth in the morning.   oxyCODONE 5 MG immediate release tablet Commonly known as: Oxy IR/ROXICODONE Take 1 tablet (5 mg total) by mouth every 4 (four) hours as needed for moderate pain.   pantoprazole 40 MG tablet Commonly known as: PROTONIX Take 40 mg by mouth daily before breakfast.   spironolactone 25 MG tablet Commonly known as: ALDACTONE Take 0.5 tablets (12.5 mg total) by mouth at bedtime.   tamsulosin 0.4 MG Caps capsule Commonly known as: FLOMAX Take 0.4 mg by mouth every evening.   Vitamin D-3  25 MCG (1000 UT) Caps Take 1,000 Units by mouth in the morning.        Follow-up Information     [facility] Follow up.   Specialty: Home Health Services Why: Home health has been arranged. They will contact you within 48 hours post discharge. to [address] Contact information: [address] [place] [address] [phone]                 Consultations: Neurosurgery  Cardiology  Procedures/Studies:  DG Thoracic Spine 2 View  Result Date: 12/01/2022 CLINICAL DATA:  T9 vertebral fracture EXAM: THORACIC SPINE 2 VIEWS COMPARISON:  Chest CT 12/01/2022 FINDINGS: AP view is limited secondary to overlying artifact. Patient's known T9 vertebral body fractures not well appreciated on lateral view view. There is no vertebral body height loss or malalignment. Mild degenerative changes are seen throughout the spine. IMPRESSION: Patient's known T9 vertebral body fracture not well on this  study. Electronically Signed   By: Ronney Asters M.D.   On: 12/01/2022 19:47   CT Angio Abd/Pel W and/or Wo Contrast  Result Date: 12/01/2022 CLINICAL DATA:  Abdominal aortic aneurysm postop follow-up EXAM: CTA ABDOMEN AND PELVIS WITHOUT AND WITH CONTRAST TECHNIQUE: Multidetector CT imaging of the abdomen and pelvis was performed using the standard protocol during bolus administration of intravenous contrast. Multiplanar reconstructed images and MIPs were obtained and reviewed to evaluate the vascular anatomy. RADIATION DOSE REDUCTION: This exam was performed according to the departmental dose-optimization program which includes automated exposure control, adjustment of the mA and/or kV according to patient size and/or use of iterative reconstruction technique. CONTRAST:  87m OMNIPAQUE IOHEXOL 350 MG/ML SOLN COMPARISON:  CTA chest, abdomen and pelvis dated October 14, 2022 FINDINGS: VASCULAR Aorta: Abdominal aortic aneurysm status post aorto bi-iliac graft repair. Graft is patent. Native aneurysm sac measures 5.0 x 4.6 cm, not significantly changed when compared with prior exam and remeasured in similar plane. Areas of contrast opacification are seen within the excluded aneurysm sac on delayed imaging, for example series 11 image 51-56, unchanged when compared with the prior exam. Celiac: Patent without evidence of aneurysm, dissection, vasculitis or significant stenosis. SMA: Patent without evidence of aneurysm, dissection, or vasculitis. Moderate narrowing at the origin due to calcified plaque. Renals: Both renal arteries are patent without evidence of aneurysm, dissection, vasculitis, or fibromuscular dysplasia. Moderate narrowing at the origins due to calcified plaque. IMA: Occluded at the origin secondary to graft with distal reconstitution of flow via collaterals. Inflow: Patent without evidence of aneurysm, dissection, vasculitis or significant stenosis. Proximal Outflow: Bilateral common femoral and  visualized portions of the superficial and profunda femoral arteries are patent without evidence of aneurysm, dissection, vasculitis or significant stenosis. Veins: No obvious venous abnormality within the limitations of this arterial phase study. Review of the MIP images confirms the above findings. NON-VASCULAR Lower chest: Small left pleural effusion with bibasilar atelectasis. Hepatobiliary: Scattered small low-attenuation liver lesions which are too small to accurately characterize but are likely simple cysts. Gallbladder unremarkable. No biliary ductal dilation. Pancreas: Unremarkable. No pancreatic ductal dilatation or surrounding inflammatory changes. Spleen: Normal in size without focal abnormality. Adrenals/Urinary Tract: Bilateral adrenal glands are unremarkable. No hydronephrosis or nephrolithiasis. Simple appearing cyst of the bilateral kidneys. Numerous bladder trabeculations. Stomach/Bowel: Small hiatal hernia. Severe diverticulosis. Stomach is within normal limits. Appendix appears normal. No evidence of bowel wall thickening, distention, or inflammatory changes. Lymphatic: No pathologically enlarged lymph nodes seen in the abdomen or pelvis. Reproductive: Mild  prostatomegaly.  Penile prosthetic device. Other: Small bilateral fat containing inguinal hernias. No abdominopelvic ascites. Musculoskeletal: No acute or significant osseous findings. IMPRESSION: 1. Prior graft repair of abdominal aortic aneurysm with persistent type 2 endoleak and stable aneurysm sac diameter of 5.0 cm. 2. Small left pleural effusion with bibasilar atelectasis. 3. Severe diverticulosis with no evidence of diverticulitis. 4. Numerous bladder trabeculations, likely due to chronic outlet obstruction. Electronically Signed   By: Yetta Glassman M.D.   On: 12/01/2022 18:16   CT Chest W Contrast  Result Date: 12/01/2022 CLINICAL DATA:  89 year old male presents following fall with history of syncope. EXAM: CT CHEST WITH  CONTRAST TECHNIQUE: Multidetector CT imaging of the chest was performed during intravenous contrast administration. RADIATION DOSE REDUCTION: This exam was performed according to the departmental dose-optimization program which includes automated exposure control, adjustment of the mA and/or kV according to patient size and/or use of iterative reconstruction technique. CONTRAST:  33m OMNIPAQUE IOHEXOL 300 MG/ML  SOLN COMPARISON:  October 14, 2022 July 24, 2022. FINDINGS: Cardiovascular: Dual lead pacer defibrillator remains in place. Thinning of the free wall of the LEFT ventricle with similar appearance and associated with a pseudoaneurysm that extends inferiorly from the LEFT heart, unchanged since prior imaging. No pericardial effusion area measuring approximately 3.5 x 2.9 cm extending inferiorly from the LEFT ventricle. Signs of median sternotomy for coronary revascularization. Central pulmonary vasculature is unremarkable to the extent evaluated. Signs of carotid stenting in the neck, incompletely evaluated. Mediastinum/Nodes: No thoracic inlet, axillary, mediastinal or hilar adenopathy. Esophagus grossly normal. Lungs/Pleura: Small chronic LEFT-sided pleural effusion similar volume as compared to prior imaging and extending into the major fissure in the LEFT chest. No pneumothorax. Pleural and parenchymal scarring at the LEFT lung base. Basilar atelectasis in the RIGHT chest. Airways are patent. Signs of prior talc pleurodesis over the LEFT hemidiaphragm unchanged. Findings unchanged since November of 2023. Upper Abdomen: Incidental imaging of upper abdominal contents without acute process. Musculoskeletal: Transverse fracture through the T9 vertebral body in the setting of multilevel spinal fusion extends horizontally through the upper vertebral body without definitive signs of extension into posterior elements but suspicious for hyper extension injury none the less. Degenerative changes and anterior  osteophytes bridging all levels of the spine as visible on today's exam mild widening of the anterior aspect of the fracture. No sign of displaced rib fracture. IMPRESSION: 1. T9 fracture or with horizontal orientation through the vertebral body extending posteriorly into the vertebral body without definitive signs of posterior element involvement but with appearance that is highly suspicious for hyperextension injury. Multilevel spinal fusion seen throughout the thoracic spine. Type of injury seen is at risk for developing pseudoarthrosis. Spinal MRI may be helpful to exclude any associated ligamentous injuries or other findings that would suggest instability though alignment of posterior elements currently is normal. 2. 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 This configuration shows increased risk of rupture and complication but is unchanged compared to prior imaging. 3. Signs of prior talc pleurodesis and small LEFT-sided effusion. 4. Aortic atherosclerosis with coronary revascularization. Aortic Atherosclerosis (ICD10-I70.0). These results were called by telephone at the time of interpretation on 12/01/2022 at 4:03 pm to provider [facility], who verbally acknowledged these results. Electronically Signed   By: GZetta BillsM.D.   On: 12/01/2022 16:03   CT Head Wo Contrast  Result Date: 12/01/2022 CLINICAL DATA:  Trauma EXAM: CT HEAD WITHOUT CONTRAST CT CERVICAL SPINE WITHOUT CONTRAST TECHNIQUE: Multidetector CT imaging of the head  and cervical spine was performed following the standard protocol without intravenous contrast. Multiplanar CT image reconstructions of the cervical spine were also generated. RADIATION DOSE REDUCTION: This exam was performed according to the departmental dose-optimization program which includes automated exposure control, adjustment of the mA and/or kV according to patient size and/or use of iterative reconstruction technique. COMPARISON:  CT C Spine 02/22/22  FINDINGS: CT HEAD FINDINGS Brain: No evidence of acute infarction, hemorrhage, hydrocephalus, extra-axial collection or mass lesion/mass effect. Vascular: No hyperdense vessel or unexpected calcification. Skull: Normal. Negative for fracture or focal lesion. Sinuses/Orbits: No middle ear or mastoid effusion. Paranasal sinuses are clear. Orbits are unremarkable. Other: None. CT CERVICAL SPINE FINDINGS Alignment: Straightening of the normal cervical lordosis. Skull base and vertebrae: No acute fracture. No primary bone lesion or focal pathologic process. Soft tissues and spinal canal: Redemonstrated left common carotid artery stent with narrowing of the stent lumen in the midportion, unchanged from prior exam. Disc levels:  No evidence of high-grade spinal canal stenosis. Upper chest:  Biapical pleural-parenchymal scarring. Other: None IMPRESSION: 1. No acute intracranial abnormality. 2. No acute cervical spine fracture or traumatic listhesis. 3. Redemonstrated left common carotid artery stent with narrowing of the stent lumen in the midportion, unchanged from prior exam. Electronically Signed   By: Marin Roberts M.D.   On: 12/01/2022 15:32   CT Cervical Spine Wo Contrast  Result Date: 12/01/2022 CLINICAL DATA:  Trauma EXAM: CT HEAD WITHOUT CONTRAST CT CERVICAL SPINE WITHOUT CONTRAST TECHNIQUE: Multidetector CT imaging of the head and cervical spine was performed following the standard protocol without intravenous contrast. Multiplanar CT image reconstructions of the cervical spine were also generated. RADIATION DOSE REDUCTION: This exam was performed according to the departmental dose-optimization program which includes automated exposure control, adjustment of the mA and/or kV according to patient size and/or use of iterative reconstruction technique. COMPARISON:  CT C Spine 02/22/22 FINDINGS: CT HEAD FINDINGS Brain: No evidence of acute infarction, hemorrhage, hydrocephalus, extra-axial collection or mass  lesion/mass effect. Vascular: No hyperdense vessel or unexpected calcification. Skull: Normal. Negative for fracture or focal lesion. Sinuses/Orbits: No middle ear or mastoid effusion. Paranasal sinuses are clear. Orbits are unremarkable. Other: None. CT CERVICAL SPINE FINDINGS Alignment: Straightening of the normal cervical lordosis. Skull base and vertebrae: No acute fracture. No primary bone lesion or focal pathologic process. Soft tissues and spinal canal: Redemonstrated left common carotid artery stent with narrowing of the stent lumen in the midportion, unchanged from prior exam. Disc levels:  No evidence of high-grade spinal canal stenosis. Upper chest:  Biapical pleural-parenchymal scarring. Other: None IMPRESSION: 1. No acute intracranial abnormality. 2. No acute cervical spine fracture or traumatic listhesis. 3. Redemonstrated left common carotid artery stent with narrowing of the stent lumen in the midportion, unchanged from prior exam. Electronically Signed   By: Marin Roberts M.D.   On: 12/01/2022 15:32   DG Chest 1 View  Result Date: 12/01/2022 CLINICAL DATA:  Fall EXAM: CHEST  1 VIEW COMPARISON:  Chest x-ray 10/14/2022. FINDINGS: The heart size and mediastinal contours are within normal limits. Both lungs are clear. No visible pleural effusions or pneumothorax. No acute osseous abnormality. Left subclavian approach cardiac rhythm maintenance device. CABG. Median sternotomy. IMPRESSION: No active disease. Electronically Signed   By: Margaretha Sheffield M.D.   On: 12/01/2022 15:16   CUP PACEART REMOTE DEVICE CHECK  Result Date: 11/26/2022 Scheduled remote reviewed. Normal device function.  Next remote 91 days. LA    Subjective: - no chest pain,  shortness of breath, no abdominal pain, nausea or vomiting.   Discharge Exam: BP (!) 141/70 (BP Location: Right Arm)   Pulse 74   Temp 97.7 F (36.5 C) (Oral)   Resp 20   Ht '6\' 3"'$  (1.905 m)   Wt 94.4 kg   SpO2 95%   BMI 26.01 kg/m    General: Pt is alert, awake, not in acute distress Cardiovascular: RRR, S1/S2 +, no rubs, no gallops Respiratory: CTA bilaterally, no wheezing, no rhonchi Abdominal: Soft, NT, ND, bowel sounds + Extremities: no edema, no cyanosis    The results of significant diagnostics from this hospitalization (including imaging, microbiology, ancillary and laboratory) are listed below for reference.     Microbiology: Recent Results (from the past 240 hour(s))  MRSA Next Gen by PCR, Nasal     Status: None   Collection Time: 12/02/22  3:47 PM   Specimen: Nasal Mucosa; Nasal Swab  Result Value Ref Range Status   MRSA by PCR Next Gen NOT DETECTED NOT DETECTED Final    Comment: (NOTE) The GeneXpert MRSA Assay (FDA approved for NASAL specimens only), is one component of a comprehensive MRSA colonization surveillance program. It is not intended to diagnose MRSA infection nor to guide or monitor treatment for MRSA infections. Test performance is not FDA approved in patients less than 34 years old. Performed at [facility], [place] [address] [place], [place] [address]      Labs: Basic Metabolic Panel: Recent Labs  Lab 11/28/22 1207 12/01/22 1552 12/03/22 0019  NA 141 137 135  K 4.6 4.5 4.1  CL 101 101 101  CO2 '24 27 24  '$ GLUCOSE 85 113* 135*  BUN 33* 37* 34*  CREATININE 1.92* 1.43* 1.49*  CALCIUM 9.4 9.5 9.2  MG 1.8  --  1.9   Liver Function Tests: Recent Labs  Lab 11/28/22 1207 12/01/22 1552  AST 21 16  ALT 15 11  ALKPHOS 117 72  BILITOT 0.5 0.9  PROT 6.9 6.9  ALBUMIN 4.1 3.5   CBC: Recent Labs  Lab 12/01/22 1552 12/03/22 0019  WBC 10.0 8.8  NEUTROABS 7.4  --   HGB 13.4 13.2  HCT 42.0 40.5  MCV 95.2 95.3  PLT 150 135*   CBG: No results for input(s): "GLUCAP" in the last 168 hours. Hgb A1c No results for input(s): "HGBA1C" in the last 72 hours. Lipid Profile No results for input(s): "CHOL", "HDL", "LDLCALC", "TRIG", "CHOLHDL", "LDLDIRECT" in the last 72  hours. Thyroid function studies No results for input(s): "TSH", "T4TOTAL", "T3FREE", "THYROIDAB" in the last 72 hours.  Invalid input(s): "FREET3" Urinalysis    Component Value Date/Time   COLORURINE YELLOW 08/04/2022 [place] 08/04/2022 1349   LABSPEC 1.010 08/04/2022 1349   PHURINE 5.0 08/04/2022 1349   GLUCOSEU NEGATIVE 08/04/2022 1349   HGBUR SMALL (A) 08/04/2022 1349   BILIRUBINUR NEGATIVE 08/04/2022 1349   KETONESUR NEGATIVE 08/04/2022 1349   PROTEINUR NEGATIVE 08/04/2022 1349   UROBILINOGEN 0.2 12/23/2010 2026   NITRITE NEGATIVE 08/04/2022 1349   LEUKOCYTESUR TRACE (A) 08/04/2022 1349    FURTHER DISCHARGE INSTRUCTIONS:   Get Medicines reviewed and adjusted: Please take all your medications with you for your next visit with your Primary MD   Laboratory/radiological data: Please request your Primary MD to go over all hospital tests and procedure/radiological results at the follow up, please ask your Primary MD to get all Hospital records sent to his/her office.   In some cases, they will be  blood work, cultures and biopsy results pending at the time of your discharge. Please request that your primary care M.D. goes through all the records of your hospital data and follows up on these results.   Also Note the following: If you experience worsening of your admission symptoms, develop shortness of breath, life threatening emergency, suicidal or homicidal thoughts you must seek medical attention immediately by calling 911 or calling your MD immediately  if symptoms less severe.   You must read complete instructions/literature along with all the possible adverse reactions/side effects for all the Medicines you take and that have been prescribed to you. Take any new Medicines after you have completely understood and accpet all the possible adverse reactions/side effects.    Do not drive when taking Pain medications or sleeping medications (Benzodaizepines)   Do  not take more than prescribed Pain, Sleep and Anxiety Medications. It is not advisable to combine anxiety,sleep and pain medications without talking with your primary care practitioner   Special Instructions: If you have smoked or chewed Tobacco  in the last 2 yrs please stop smoking, stop any regular Alcohol  and or any Recreational drug use.   Wear Seat belts while driving.   Please note: You were cared for by a hospitalist during your hospital stay. Once you are discharged, your primary care physician will handle any further medical issues. Please note that NO REFILLS for any discharge medications will be authorized once you are discharged, as it is imperative that you return to your primary care physician (or establish a relationship with a primary care physician if you do not have one) for your post hospital discharge needs so that they can reassess your need for medications and monitor your lab values.  Time coordinating discharge: 40 minutes  SIGNED:  Marzetta Board, MD, PhD 12/04/2022, 12:33 PM

## 2022-12-05 ENCOUNTER — Encounter: Payer: BLUE CROSS/BLUE SHIELD | Admitting: Physician Assistant

## 2022-12-06 DIAGNOSIS — K2270 Barrett's esophagus without dysplasia: Secondary | ICD-10-CM | POA: Diagnosis not present

## 2022-12-06 DIAGNOSIS — Z8701 Personal history of pneumonia (recurrent): Secondary | ICD-10-CM | POA: Diagnosis not present

## 2022-12-06 DIAGNOSIS — E039 Hypothyroidism, unspecified: Secondary | ICD-10-CM | POA: Diagnosis not present

## 2022-12-06 DIAGNOSIS — M545 Low back pain, unspecified: Secondary | ICD-10-CM | POA: Diagnosis not present

## 2022-12-06 DIAGNOSIS — I4510 Unspecified right bundle-branch block: Secondary | ICD-10-CM | POA: Diagnosis not present

## 2022-12-06 DIAGNOSIS — I4819 Other persistent atrial fibrillation: Secondary | ICD-10-CM | POA: Diagnosis not present

## 2022-12-06 DIAGNOSIS — I25810 Atherosclerosis of coronary artery bypass graft(s) without angina pectoris: Secondary | ICD-10-CM | POA: Diagnosis not present

## 2022-12-06 DIAGNOSIS — I5022 Chronic systolic (congestive) heart failure: Secondary | ICD-10-CM | POA: Diagnosis not present

## 2022-12-06 DIAGNOSIS — N1831 Chronic kidney disease, stage 3a: Secondary | ICD-10-CM | POA: Diagnosis not present

## 2022-12-06 DIAGNOSIS — K219 Gastro-esophageal reflux disease without esophagitis: Secondary | ICD-10-CM | POA: Diagnosis not present

## 2022-12-06 DIAGNOSIS — E7800 Pure hypercholesterolemia, unspecified: Secondary | ICD-10-CM | POA: Diagnosis not present

## 2022-12-06 DIAGNOSIS — I700 Atherosclerosis of aorta: Secondary | ICD-10-CM | POA: Diagnosis not present

## 2022-12-06 DIAGNOSIS — G8929 Other chronic pain: Secondary | ICD-10-CM | POA: Diagnosis not present

## 2022-12-06 DIAGNOSIS — S22079D Unspecified fracture of T9-T10 vertebra, subsequent encounter for fracture with routine healing: Secondary | ICD-10-CM | POA: Diagnosis not present

## 2022-12-06 DIAGNOSIS — I2510 Atherosclerotic heart disease of native coronary artery without angina pectoris: Secondary | ICD-10-CM | POA: Diagnosis not present

## 2022-12-06 DIAGNOSIS — I130 Hypertensive heart and chronic kidney disease with heart failure and stage 1 through stage 4 chronic kidney disease, or unspecified chronic kidney disease: Secondary | ICD-10-CM | POA: Diagnosis not present

## 2022-12-06 DIAGNOSIS — J9811 Atelectasis: Secondary | ICD-10-CM | POA: Diagnosis not present

## 2022-12-06 DIAGNOSIS — I252 Old myocardial infarction: Secondary | ICD-10-CM | POA: Diagnosis not present

## 2022-12-06 DIAGNOSIS — G4733 Obstructive sleep apnea (adult) (pediatric): Secondary | ICD-10-CM | POA: Diagnosis not present

## 2022-12-06 DIAGNOSIS — I472 Ventricular tachycardia, unspecified: Secondary | ICD-10-CM | POA: Diagnosis not present

## 2022-12-06 DIAGNOSIS — M1990 Unspecified osteoarthritis, unspecified site: Secondary | ICD-10-CM | POA: Diagnosis not present

## 2022-12-06 DIAGNOSIS — G250 Essential tremor: Secondary | ICD-10-CM | POA: Diagnosis not present

## 2022-12-06 DIAGNOSIS — N400 Enlarged prostate without lower urinary tract symptoms: Secondary | ICD-10-CM | POA: Diagnosis not present

## 2022-12-06 DIAGNOSIS — Z95810 Presence of automatic (implantable) cardiac defibrillator: Secondary | ICD-10-CM | POA: Diagnosis not present

## 2022-12-06 DIAGNOSIS — S51812D Laceration without foreign body of left forearm, subsequent encounter: Secondary | ICD-10-CM | POA: Diagnosis not present

## 2022-12-09 DIAGNOSIS — I2510 Atherosclerotic heart disease of native coronary artery without angina pectoris: Secondary | ICD-10-CM | POA: Diagnosis not present

## 2022-12-09 DIAGNOSIS — I472 Ventricular tachycardia, unspecified: Secondary | ICD-10-CM | POA: Diagnosis not present

## 2022-12-09 DIAGNOSIS — S22079D Unspecified fracture of T9-T10 vertebra, subsequent encounter for fracture with routine healing: Secondary | ICD-10-CM | POA: Diagnosis not present

## 2022-12-09 DIAGNOSIS — E039 Hypothyroidism, unspecified: Secondary | ICD-10-CM | POA: Diagnosis not present

## 2022-12-09 DIAGNOSIS — S40812A Abrasion of left upper arm, initial encounter: Secondary | ICD-10-CM | POA: Diagnosis not present

## 2022-12-09 DIAGNOSIS — R55 Syncope and collapse: Secondary | ICD-10-CM | POA: Diagnosis not present

## 2022-12-09 DIAGNOSIS — I4819 Other persistent atrial fibrillation: Secondary | ICD-10-CM | POA: Diagnosis not present

## 2022-12-09 DIAGNOSIS — S22079A Unspecified fracture of T9-T10 vertebra, initial encounter for closed fracture: Secondary | ICD-10-CM | POA: Diagnosis not present

## 2022-12-09 DIAGNOSIS — I5022 Chronic systolic (congestive) heart failure: Secondary | ICD-10-CM | POA: Diagnosis not present

## 2022-12-09 DIAGNOSIS — I130 Hypertensive heart and chronic kidney disease with heart failure and stage 1 through stage 4 chronic kidney disease, or unspecified chronic kidney disease: Secondary | ICD-10-CM | POA: Diagnosis not present

## 2022-12-09 DIAGNOSIS — N1831 Chronic kidney disease, stage 3a: Secondary | ICD-10-CM | POA: Diagnosis not present

## 2022-12-09 DIAGNOSIS — I480 Paroxysmal atrial fibrillation: Secondary | ICD-10-CM | POA: Diagnosis not present

## 2022-12-09 DIAGNOSIS — I25810 Atherosclerosis of coronary artery bypass graft(s) without angina pectoris: Secondary | ICD-10-CM | POA: Diagnosis not present

## 2022-12-09 DIAGNOSIS — E785 Hyperlipidemia, unspecified: Secondary | ICD-10-CM | POA: Diagnosis not present

## 2022-12-10 DIAGNOSIS — S22079D Unspecified fracture of T9-T10 vertebra, subsequent encounter for fracture with routine healing: Secondary | ICD-10-CM | POA: Diagnosis not present

## 2022-12-10 DIAGNOSIS — I130 Hypertensive heart and chronic kidney disease with heart failure and stage 1 through stage 4 chronic kidney disease, or unspecified chronic kidney disease: Secondary | ICD-10-CM | POA: Diagnosis not present

## 2022-12-10 DIAGNOSIS — I25810 Atherosclerosis of coronary artery bypass graft(s) without angina pectoris: Secondary | ICD-10-CM | POA: Diagnosis not present

## 2022-12-10 DIAGNOSIS — I2510 Atherosclerotic heart disease of native coronary artery without angina pectoris: Secondary | ICD-10-CM | POA: Diagnosis not present

## 2022-12-10 DIAGNOSIS — I5022 Chronic systolic (congestive) heart failure: Secondary | ICD-10-CM | POA: Diagnosis not present

## 2022-12-10 DIAGNOSIS — I4819 Other persistent atrial fibrillation: Secondary | ICD-10-CM | POA: Diagnosis not present

## 2022-12-16 DIAGNOSIS — S22078A Other fracture of T9-T10 vertebra, initial encounter for closed fracture: Secondary | ICD-10-CM | POA: Diagnosis not present

## 2022-12-16 DIAGNOSIS — Z6828 Body mass index (BMI) 28.0-28.9, adult: Secondary | ICD-10-CM | POA: Diagnosis not present

## 2022-12-17 DIAGNOSIS — S22079D Unspecified fracture of T9-T10 vertebra, subsequent encounter for fracture with routine healing: Secondary | ICD-10-CM | POA: Diagnosis not present

## 2022-12-17 DIAGNOSIS — I5022 Chronic systolic (congestive) heart failure: Secondary | ICD-10-CM | POA: Diagnosis not present

## 2022-12-17 DIAGNOSIS — I2510 Atherosclerotic heart disease of native coronary artery without angina pectoris: Secondary | ICD-10-CM | POA: Diagnosis not present

## 2022-12-17 DIAGNOSIS — I25810 Atherosclerosis of coronary artery bypass graft(s) without angina pectoris: Secondary | ICD-10-CM | POA: Diagnosis not present

## 2022-12-17 DIAGNOSIS — I130 Hypertensive heart and chronic kidney disease with heart failure and stage 1 through stage 4 chronic kidney disease, or unspecified chronic kidney disease: Secondary | ICD-10-CM | POA: Diagnosis not present

## 2022-12-17 DIAGNOSIS — I4819 Other persistent atrial fibrillation: Secondary | ICD-10-CM | POA: Diagnosis not present

## 2022-12-18 DIAGNOSIS — I5022 Chronic systolic (congestive) heart failure: Secondary | ICD-10-CM | POA: Diagnosis not present

## 2022-12-18 DIAGNOSIS — S22079D Unspecified fracture of T9-T10 vertebra, subsequent encounter for fracture with routine healing: Secondary | ICD-10-CM | POA: Diagnosis not present

## 2022-12-18 DIAGNOSIS — I4819 Other persistent atrial fibrillation: Secondary | ICD-10-CM | POA: Diagnosis not present

## 2022-12-18 DIAGNOSIS — I130 Hypertensive heart and chronic kidney disease with heart failure and stage 1 through stage 4 chronic kidney disease, or unspecified chronic kidney disease: Secondary | ICD-10-CM | POA: Diagnosis not present

## 2022-12-18 DIAGNOSIS — I2510 Atherosclerotic heart disease of native coronary artery without angina pectoris: Secondary | ICD-10-CM | POA: Diagnosis not present

## 2022-12-18 DIAGNOSIS — I25810 Atherosclerosis of coronary artery bypass graft(s) without angina pectoris: Secondary | ICD-10-CM | POA: Diagnosis not present

## 2022-12-22 NOTE — Progress Notes (Unsigned)
Electrophysiology Office Note:   Date:  12/23/2022  ID:  Eric Gordon, DOB [birth year], MRN [id number]  Primary Cardiologist: None Electrophysiologist: Vickie Epley, MD   History of Present Illness:   Eric Gordon is a 89 y.o. male with h/o chronic systolic HF, VT with Biotronik ICD, CAD s/p CABG, Chronic anticoagulation with eliquis  seen today for post hospital follow up.    Admitted 3/11 - 3/14 with fall, syncope, and T9 vertebral fracture. He had VT treated by device on 3/7 associated with syncopal episode.  He has not further VT on ICD interrogation.  He is fall from 3/9 did not appear to be associated with VT.    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   Since discharge from hospital the patient reports doing well from a cardiac perspective. Has brief lightheadedness with rapid standing, but none at rest. No further syncope or falls. In back brace for 6-8 weeks, at least.  he denies chest pain, palpitations, dyspnea, PND, orthopnea, nausea, vomiting, syncope, edema, weight gain, or early satiety.   Review of systems complete and found to be negative unless listed in HPI.   Device History: Biotronik Dual Chamber ICD implanted 02/25/2022 for VT and ICM History of appropriate therapy: Yes History of AAD therapy: Previously taken off amiodarone with concerns for pulm tox (for AF)  Studies Reviewed:    ICD Interrogation-  reviewed in detail today,  See PACEART report.  EKG is not ordered today   Risk Assessment/Calculations:             Physical Exam:   VS:  BP 134/80   Pulse 70   Ht 6\' 3"  (1.905 m)   Wt 207 lb (93.9 kg)   SpO2 95%   BMI 25.87 kg/m    Wt Readings from Last 3 Encounters:  12/23/22 207 lb (93.9 kg)  12/02/22 208 lb 1.6 oz (94.4 kg)  11/04/22 204 lb (92.5 kg)     GEN: Well nourished, well developed in  no acute distress NECK: No JVD; No carotid bruits CARDIAC: Regular rate and rhythm, no murmurs, rubs, gallops RESPIRATORY:  Clear to auscultation without rales, wheezing or rhonchi  ABDOMEN: Soft, non-tender, non-distended EXTREMITIES:  No edema; No deformity   ASSESSMENT AND PLAN:    Chronic systolic dysfunction s/p Biotronik dual chamber ICD  euvolemic today Stable on an appropriate medical regimen Normal ICD function See Pace Art report No changes today  Syncope VT No further VT on ICD interrogation. Episode 3/7 previously noted, no episode associated with fall 3/9. Brief NSVT 3/12. Continue toprol 25 mg daily Continue amiodarone 200 mg daily.  Surveillance labs @ 6 weeks.  Unfortunately, he has a quite advanced cardiomyopathy and few options for escalation of treatment.  He understands that amiodarone may or may not have been related to his issue with pleural effusions and is agreeable to this re-challenge.    HFrmEF ICM Echo 45-50% 10/2022 GDMT significantly limited by hypotension Would titrate midodrine up as needed/tolerated.   CAD Cath 09/2022 with 2 of 5 patent grafts   PAF Continue eliquis for CHA2DS2VASc of at least 4 Well controlled overall.    Orthostasis Has had several falls and near falls NOT associated with VT.  Continue midodrine 2.5 mg BID. Can titrate as needed.   Disposition:   Follow up with EP APP or Dr.  Lambert in 4 weeks for amio surveillance.    Signed, Shirley Friar, PA-C

## 2022-12-23 ENCOUNTER — Encounter: Payer: Self-pay | Admitting: Student

## 2022-12-23 ENCOUNTER — Ambulatory Visit: Payer: Medicare Other | Attending: Student | Admitting: Student

## 2022-12-23 VITALS — BP 134/80 | HR 70 | Ht 75.0 in | Wt 207.0 lb

## 2022-12-23 DIAGNOSIS — I480 Paroxysmal atrial fibrillation: Secondary | ICD-10-CM | POA: Diagnosis not present

## 2022-12-23 DIAGNOSIS — I472 Ventricular tachycardia, unspecified: Secondary | ICD-10-CM | POA: Diagnosis not present

## 2022-12-23 DIAGNOSIS — E782 Mixed hyperlipidemia: Secondary | ICD-10-CM | POA: Diagnosis not present

## 2022-12-23 DIAGNOSIS — R0602 Shortness of breath: Secondary | ICD-10-CM | POA: Diagnosis not present

## 2022-12-23 DIAGNOSIS — I5022 Chronic systolic (congestive) heart failure: Secondary | ICD-10-CM | POA: Diagnosis not present

## 2022-12-23 LAB — CUP PACEART INCLINIC DEVICE CHECK
Date Time Interrogation Session: 20240402115632
Implantable Lead Connection Status: 753985
Implantable Lead Connection Status: 753985
Implantable Lead Implant Date: 20230606
Implantable Lead Implant Date: 20230606
Implantable Lead Location: 753859
Implantable Lead Location: 753860
Implantable Lead Model: 377
Implantable Lead Model: 402266
Implantable Lead Serial Number: 8000807784
Implantable Lead Serial Number: 81503989
Implantable Pulse Generator Implant Date: 20230606
Pulse Gen Model: 429534
Pulse Gen Serial Number: 84900693

## 2022-12-23 MED ORDER — AMIODARONE HCL 200 MG PO TABS: 200.0000 mg | ORAL_TABLET | Freq: Every day | ORAL | 6 refills | Status: DC

## 2022-12-23 NOTE — Patient Instructions (Signed)
Medication Instructions:  Your physician recommends that you continue on your current medications as directed. Please refer to the Current Medication list given to you today.  *If you need a refill on your cardiac medications before your next appointment, please call your pharmacy*  Lab Work: None If you have labs (blood work) drawn today and your tests are completely normal, you will receive your results only by: [place] (if you have MyChart) OR A paper copy in the mail If you have any lab test that is abnormal or we need to change your treatment, we will call you to review the results.  Follow-Up: At [facility], you and your health needs are our priority.  As part of our continuing mission to provide you with exceptional heart care, we have created designated Provider Care Teams.  These Care Teams include your primary Cardiologist (physician) and Advanced Practice Providers (APPs -  Physician Assistants and Nurse Practitioners) who all work together to provide you with the care you need, when you need it.  Your next appointment:   4-6 week(s)  Provider:   Lars Mage, MD or Beryle Beams" [place], [place]

## 2022-12-26 DIAGNOSIS — I130 Hypertensive heart and chronic kidney disease with heart failure and stage 1 through stage 4 chronic kidney disease, or unspecified chronic kidney disease: Secondary | ICD-10-CM | POA: Diagnosis not present

## 2022-12-26 DIAGNOSIS — I2510 Atherosclerotic heart disease of native coronary artery without angina pectoris: Secondary | ICD-10-CM | POA: Diagnosis not present

## 2022-12-26 DIAGNOSIS — S22079D Unspecified fracture of T9-T10 vertebra, subsequent encounter for fracture with routine healing: Secondary | ICD-10-CM | POA: Diagnosis not present

## 2022-12-26 DIAGNOSIS — I25810 Atherosclerosis of coronary artery bypass graft(s) without angina pectoris: Secondary | ICD-10-CM | POA: Diagnosis not present

## 2022-12-26 DIAGNOSIS — I5022 Chronic systolic (congestive) heart failure: Secondary | ICD-10-CM | POA: Diagnosis not present

## 2022-12-26 DIAGNOSIS — I4819 Other persistent atrial fibrillation: Secondary | ICD-10-CM | POA: Diagnosis not present

## 2022-12-30 NOTE — Progress Notes (Signed)
Remote ICD transmission.   

## 2022-12-31 DIAGNOSIS — I5022 Chronic systolic (congestive) heart failure: Secondary | ICD-10-CM | POA: Diagnosis not present

## 2022-12-31 DIAGNOSIS — I4819 Other persistent atrial fibrillation: Secondary | ICD-10-CM | POA: Diagnosis not present

## 2022-12-31 DIAGNOSIS — I130 Hypertensive heart and chronic kidney disease with heart failure and stage 1 through stage 4 chronic kidney disease, or unspecified chronic kidney disease: Secondary | ICD-10-CM | POA: Diagnosis not present

## 2022-12-31 DIAGNOSIS — S22079D Unspecified fracture of T9-T10 vertebra, subsequent encounter for fracture with routine healing: Secondary | ICD-10-CM | POA: Diagnosis not present

## 2022-12-31 DIAGNOSIS — I2510 Atherosclerotic heart disease of native coronary artery without angina pectoris: Secondary | ICD-10-CM | POA: Diagnosis not present

## 2022-12-31 DIAGNOSIS — I25810 Atherosclerosis of coronary artery bypass graft(s) without angina pectoris: Secondary | ICD-10-CM | POA: Diagnosis not present

## 2023-01-05 DIAGNOSIS — I25810 Atherosclerosis of coronary artery bypass graft(s) without angina pectoris: Secondary | ICD-10-CM | POA: Diagnosis not present

## 2023-01-05 DIAGNOSIS — N400 Enlarged prostate without lower urinary tract symptoms: Secondary | ICD-10-CM | POA: Diagnosis not present

## 2023-01-05 DIAGNOSIS — K219 Gastro-esophageal reflux disease without esophagitis: Secondary | ICD-10-CM | POA: Diagnosis not present

## 2023-01-05 DIAGNOSIS — I4510 Unspecified right bundle-branch block: Secondary | ICD-10-CM | POA: Diagnosis not present

## 2023-01-05 DIAGNOSIS — G250 Essential tremor: Secondary | ICD-10-CM | POA: Diagnosis not present

## 2023-01-05 DIAGNOSIS — I5022 Chronic systolic (congestive) heart failure: Secondary | ICD-10-CM | POA: Diagnosis not present

## 2023-01-05 DIAGNOSIS — I2510 Atherosclerotic heart disease of native coronary artery without angina pectoris: Secondary | ICD-10-CM | POA: Diagnosis not present

## 2023-01-05 DIAGNOSIS — E039 Hypothyroidism, unspecified: Secondary | ICD-10-CM | POA: Diagnosis not present

## 2023-01-05 DIAGNOSIS — M545 Low back pain, unspecified: Secondary | ICD-10-CM | POA: Diagnosis not present

## 2023-01-05 DIAGNOSIS — G8929 Other chronic pain: Secondary | ICD-10-CM | POA: Diagnosis not present

## 2023-01-05 DIAGNOSIS — J9811 Atelectasis: Secondary | ICD-10-CM | POA: Diagnosis not present

## 2023-01-05 DIAGNOSIS — S51812D Laceration without foreign body of left forearm, subsequent encounter: Secondary | ICD-10-CM | POA: Diagnosis not present

## 2023-01-05 DIAGNOSIS — I472 Ventricular tachycardia, unspecified: Secondary | ICD-10-CM | POA: Diagnosis not present

## 2023-01-05 DIAGNOSIS — I700 Atherosclerosis of aorta: Secondary | ICD-10-CM | POA: Diagnosis not present

## 2023-01-05 DIAGNOSIS — G4733 Obstructive sleep apnea (adult) (pediatric): Secondary | ICD-10-CM | POA: Diagnosis not present

## 2023-01-05 DIAGNOSIS — I130 Hypertensive heart and chronic kidney disease with heart failure and stage 1 through stage 4 chronic kidney disease, or unspecified chronic kidney disease: Secondary | ICD-10-CM | POA: Diagnosis not present

## 2023-01-05 DIAGNOSIS — I252 Old myocardial infarction: Secondary | ICD-10-CM | POA: Diagnosis not present

## 2023-01-05 DIAGNOSIS — E7800 Pure hypercholesterolemia, unspecified: Secondary | ICD-10-CM | POA: Diagnosis not present

## 2023-01-05 DIAGNOSIS — Z8701 Personal history of pneumonia (recurrent): Secondary | ICD-10-CM | POA: Diagnosis not present

## 2023-01-05 DIAGNOSIS — Z95810 Presence of automatic (implantable) cardiac defibrillator: Secondary | ICD-10-CM | POA: Diagnosis not present

## 2023-01-05 DIAGNOSIS — N1831 Chronic kidney disease, stage 3a: Secondary | ICD-10-CM | POA: Diagnosis not present

## 2023-01-05 DIAGNOSIS — I4819 Other persistent atrial fibrillation: Secondary | ICD-10-CM | POA: Diagnosis not present

## 2023-01-05 DIAGNOSIS — S22079D Unspecified fracture of T9-T10 vertebra, subsequent encounter for fracture with routine healing: Secondary | ICD-10-CM | POA: Diagnosis not present

## 2023-01-05 DIAGNOSIS — M1990 Unspecified osteoarthritis, unspecified site: Secondary | ICD-10-CM | POA: Diagnosis not present

## 2023-01-05 DIAGNOSIS — K2270 Barrett's esophagus without dysplasia: Secondary | ICD-10-CM | POA: Diagnosis not present

## 2023-01-07 DIAGNOSIS — I25810 Atherosclerosis of coronary artery bypass graft(s) without angina pectoris: Secondary | ICD-10-CM | POA: Diagnosis not present

## 2023-01-07 DIAGNOSIS — I2510 Atherosclerotic heart disease of native coronary artery without angina pectoris: Secondary | ICD-10-CM | POA: Diagnosis not present

## 2023-01-07 DIAGNOSIS — S22079D Unspecified fracture of T9-T10 vertebra, subsequent encounter for fracture with routine healing: Secondary | ICD-10-CM | POA: Diagnosis not present

## 2023-01-07 DIAGNOSIS — I130 Hypertensive heart and chronic kidney disease with heart failure and stage 1 through stage 4 chronic kidney disease, or unspecified chronic kidney disease: Secondary | ICD-10-CM | POA: Diagnosis not present

## 2023-01-07 DIAGNOSIS — I5022 Chronic systolic (congestive) heart failure: Secondary | ICD-10-CM | POA: Diagnosis not present

## 2023-01-07 DIAGNOSIS — I4819 Other persistent atrial fibrillation: Secondary | ICD-10-CM | POA: Diagnosis not present

## 2023-01-13 ENCOUNTER — Other Ambulatory Visit (HOSPITAL_COMMUNITY): Payer: Self-pay | Admitting: Student

## 2023-01-13 DIAGNOSIS — S22078A Other fracture of T9-T10 vertebra, initial encounter for closed fracture: Secondary | ICD-10-CM | POA: Diagnosis not present

## 2023-01-14 DIAGNOSIS — I5022 Chronic systolic (congestive) heart failure: Secondary | ICD-10-CM | POA: Diagnosis not present

## 2023-01-14 DIAGNOSIS — I2510 Atherosclerotic heart disease of native coronary artery without angina pectoris: Secondary | ICD-10-CM | POA: Diagnosis not present

## 2023-01-14 DIAGNOSIS — I25810 Atherosclerosis of coronary artery bypass graft(s) without angina pectoris: Secondary | ICD-10-CM | POA: Diagnosis not present

## 2023-01-14 DIAGNOSIS — I130 Hypertensive heart and chronic kidney disease with heart failure and stage 1 through stage 4 chronic kidney disease, or unspecified chronic kidney disease: Secondary | ICD-10-CM | POA: Diagnosis not present

## 2023-01-14 DIAGNOSIS — I4819 Other persistent atrial fibrillation: Secondary | ICD-10-CM | POA: Diagnosis not present

## 2023-01-14 DIAGNOSIS — S22079D Unspecified fracture of T9-T10 vertebra, subsequent encounter for fracture with routine healing: Secondary | ICD-10-CM | POA: Diagnosis not present

## 2023-01-26 ENCOUNTER — Ambulatory Visit (HOSPITAL_COMMUNITY)
Admission: RE | Admit: 2023-01-26 | Discharge: 2023-01-26 | Disposition: A | Discharge status: Home / Self Care | Payer: Medicare Other | Source: Ambulatory Visit | Attending: Student | Admitting: Student

## 2023-01-26 DIAGNOSIS — S22079A Unspecified fracture of T9-T10 vertebra, initial encounter for closed fracture: Secondary | ICD-10-CM | POA: Diagnosis not present

## 2023-01-26 DIAGNOSIS — S22078A Other fracture of T9-T10 vertebra, initial encounter for closed fracture: Secondary | ICD-10-CM | POA: Insufficient documentation

## 2023-01-28 DIAGNOSIS — I130 Hypertensive heart and chronic kidney disease with heart failure and stage 1 through stage 4 chronic kidney disease, or unspecified chronic kidney disease: Secondary | ICD-10-CM | POA: Diagnosis not present

## 2023-01-28 DIAGNOSIS — I4819 Other persistent atrial fibrillation: Secondary | ICD-10-CM | POA: Diagnosis not present

## 2023-01-28 DIAGNOSIS — I5022 Chronic systolic (congestive) heart failure: Secondary | ICD-10-CM | POA: Diagnosis not present

## 2023-01-28 DIAGNOSIS — S22079D Unspecified fracture of T9-T10 vertebra, subsequent encounter for fracture with routine healing: Secondary | ICD-10-CM | POA: Diagnosis not present

## 2023-01-28 DIAGNOSIS — I2510 Atherosclerotic heart disease of native coronary artery without angina pectoris: Secondary | ICD-10-CM | POA: Diagnosis not present

## 2023-01-28 DIAGNOSIS — I25810 Atherosclerosis of coronary artery bypass graft(s) without angina pectoris: Secondary | ICD-10-CM | POA: Diagnosis not present

## 2023-01-29 ENCOUNTER — Encounter (HOSPITAL_COMMUNITY): Payer: Self-pay

## 2023-01-29 ENCOUNTER — Ambulatory Visit (HOSPITAL_COMMUNITY)
Admission: RE | Admit: 2023-01-29 | Discharge: 2023-01-29 | Disposition: A | Discharge status: Home / Self Care | Payer: Medicare Other | Source: Ambulatory Visit | Attending: Internal Medicine | Admitting: Internal Medicine

## 2023-01-29 VITALS — BP 122/74 | HR 64 | Wt 205.6 lb

## 2023-01-29 DIAGNOSIS — I495 Sick sinus syndrome: Secondary | ICD-10-CM | POA: Diagnosis not present

## 2023-01-29 DIAGNOSIS — Z79899 Other long term (current) drug therapy: Secondary | ICD-10-CM | POA: Insufficient documentation

## 2023-01-29 DIAGNOSIS — J90 Pleural effusion, not elsewhere classified: Secondary | ICD-10-CM | POA: Diagnosis not present

## 2023-01-29 DIAGNOSIS — I252 Old myocardial infarction: Secondary | ICD-10-CM | POA: Diagnosis not present

## 2023-01-29 DIAGNOSIS — E785 Hyperlipidemia, unspecified: Secondary | ICD-10-CM | POA: Insufficient documentation

## 2023-01-29 DIAGNOSIS — I6529 Occlusion and stenosis of unspecified carotid artery: Secondary | ICD-10-CM | POA: Diagnosis not present

## 2023-01-29 DIAGNOSIS — Z7901 Long term (current) use of anticoagulants: Secondary | ICD-10-CM | POA: Insufficient documentation

## 2023-01-29 DIAGNOSIS — I2510 Atherosclerotic heart disease of native coronary artery without angina pectoris: Secondary | ICD-10-CM | POA: Insufficient documentation

## 2023-01-29 DIAGNOSIS — I729 Aneurysm of unspecified site: Secondary | ICD-10-CM | POA: Diagnosis not present

## 2023-01-29 DIAGNOSIS — R55 Syncope and collapse: Secondary | ICD-10-CM

## 2023-01-29 DIAGNOSIS — R531 Weakness: Secondary | ICD-10-CM | POA: Insufficient documentation

## 2023-01-29 DIAGNOSIS — I480 Paroxysmal atrial fibrillation: Secondary | ICD-10-CM | POA: Diagnosis not present

## 2023-01-29 DIAGNOSIS — I25810 Atherosclerosis of coronary artery bypass graft(s) without angina pectoris: Secondary | ICD-10-CM | POA: Insufficient documentation

## 2023-01-29 DIAGNOSIS — N183 Chronic kidney disease, stage 3 unspecified: Secondary | ICD-10-CM | POA: Diagnosis not present

## 2023-01-29 DIAGNOSIS — I452 Bifascicular block: Secondary | ICD-10-CM | POA: Diagnosis not present

## 2023-01-29 DIAGNOSIS — I5022 Chronic systolic (congestive) heart failure: Secondary | ICD-10-CM

## 2023-01-29 DIAGNOSIS — I714 Abdominal aortic aneurysm, without rupture, unspecified: Secondary | ICD-10-CM | POA: Insufficient documentation

## 2023-01-29 DIAGNOSIS — I130 Hypertensive heart and chronic kidney disease with heart failure and stage 1 through stage 4 chronic kidney disease, or unspecified chronic kidney disease: Secondary | ICD-10-CM | POA: Insufficient documentation

## 2023-01-29 DIAGNOSIS — I472 Ventricular tachycardia, unspecified: Secondary | ICD-10-CM

## 2023-01-29 DIAGNOSIS — I951 Orthostatic hypotension: Secondary | ICD-10-CM | POA: Insufficient documentation

## 2023-01-29 DIAGNOSIS — I350 Nonrheumatic aortic (valve) stenosis: Secondary | ICD-10-CM | POA: Diagnosis not present

## 2023-01-29 DIAGNOSIS — R0600 Dyspnea, unspecified: Secondary | ICD-10-CM | POA: Diagnosis not present

## 2023-01-29 DIAGNOSIS — I253 Aneurysm of heart: Secondary | ICD-10-CM | POA: Insufficient documentation

## 2023-01-29 LAB — BASIC METABOLIC PANEL
Anion gap: 9 (ref 5–15)
BUN: 38 mg/dL — ABNORMAL HIGH (ref 8–23)
CO2: 27 mmol/L (ref 22–32)
Calcium: 9.9 mg/dL (ref 8.9–10.3)
Chloride: 100 mmol/L (ref 98–111)
Creatinine, Ser: 2 mg/dL — ABNORMAL HIGH (ref 0.61–1.24)
GFR, Estimated: 31 mL/min — ABNORMAL LOW (ref >60)
Glucose, Bld: 95 mg/dL (ref 70–99)
Potassium: 4.3 mmol/L (ref 3.5–5.1)
Sodium: 136 mmol/L (ref 135–145)

## 2023-01-29 LAB — CBC
HCT: 45.8 % (ref 39.0–52.0)
Hemoglobin: 15.1 g/dL (ref 13.0–17.0)
MCH: 31.6 pg (ref 26.0–34.0)
MCHC: 33 g/dL (ref 30.0–36.0)
MCV: 95.8 fL (ref 80.0–100.0)
Platelets: 178 10*3/uL (ref 150–400)
RBC: 4.78 MIL/uL (ref 4.22–5.81)
RDW: 15 % (ref 11.5–15.5)
WBC: 9 10*3/uL (ref 4.0–10.5)
nRBC: 0 % (ref 0.0–0.2)

## 2023-01-29 LAB — SEDIMENTATION RATE: Sed Rate: 30 mm/hr — ABNORMAL HIGH (ref 0–16)

## 2023-01-29 NOTE — Progress Notes (Signed)
Advanced Heart Failure Clinic Note  Date:  01/29/2023   ID:  Eric Gordon, DOB [birth year], MRN [id number]   Provider location: [facility] Type of Visit: Established patient  PCP:  Creola Corn, MD  EP: Dr. Lalla Brothers HF Cardiologist:  Dr. Shirlee Latch   HPI: Eric Gordon is a 89 y.o. male with history of CAD s/p CABG.  He has had trouble long-term with exertional dyspnea.  Dyspnea triggered evaluation in 2012 leading to CABG but was not resolved by CABG.  PFTs in 3/14 showed only mild obstructive airways disease and V/Q scan showed no PE.  Echo in 9/14 showed normal LV systolic function with moderately dilated RV. RHC in 2/15 showed normal right and left heart filling pressures and normal PA pressure.  Finally, he had a CPX in 3/15 that showed normal capacity compared to age-matched sedentary norms.  He was noted to have chronotropic incompetence.  At a prior appointment, I took him off metoprolol given chronotropic incompetence noted on CPX.  Dyspnea improved significantly with weight loss.  He had Cardiolite in 8/18 with EF 52%, no ischemia or infarction.    He was admitted in 1/20 with NSTEMI.  LHC showed new occlusion of the branch of SVG-ramus and OM that touched down on OM.  There were also serial 70% stenoses in the mid/distal RCA.  There was not thought to be an interventional option and patient was treated medically. He was discharged home but presented a couple days later with recurrent chest pain and dyspnea.  Cath was repeated showing no change from prior.  This admission, he had FFR of the RCA which did not suggest hemodynamic significance.  Echo showed EF 55-60%, normal RV.  CTA did not show a PE. He was noted to be volume overloaded and was diuresed.  He was also noted to be in atrial fibrillation with RVR transiently.  ASA/Plavix was stopped and Eliquis was started. He was discharged to SNF.  Atrial fibrillation recurred and he was started on amiodarone  with conversion back to NSR.   In 4/20, he was admitted with left spontaneous PTX requiring chest tube.  After this, he developed recurrent left pleural effusions requiring thoracentesis x 3 so far.  Pleural fluid was exudative with eosinophil predominance.  With elevated ESR, there was concern that amiodarone could be involved, so this medication was stopped.  CT chest in 6/20 showed LLL consolidation/collapse with small left effusion, there was a rim-enhancing lesion posterior to the heart between esophagus and left atrium, ?loculated fluid.  TEE was done in 7/20 and confirmed loculated pleural effusion behind the left atrium.  In 7/20, patient had VATS on the left.   Carotid dopplers in 9/21 showed 80-99% RICA stenosis, 60-79% LICA.  AAA [place] in 9/21 showed 4.5 cm AAA. Patient was referred to Dr. Myra Gianotti for evaluation => he has had TCAR on right in 12/21.  He had left TCAR in 5/22.   Cardiolite in 12/21 with EF 41%, no ischemia, fixed inferolateral defect. Echo in 1/22 showed EF 40-45%, basal to mid inferolateral AK, basal inferior HK, normal RV, mild aortic stenosis mean gradient 11 mmHg.  He was unable to tolerate dapagliflozin and is off it. He was lightheaded on low dose Entresto so this was stopped.   Zio monitor in 1/23 showed 25% atrial flutter.  He was referred to EP, by the time he saw EP, burden of atrial fibrillation was lower.   Cardiolite in 3/23 showed prior inferolateral  and inferior infarct, no ischemia.   Patient was admitted in 4/23 with atypical chest pain.  This likely was due to cough/bronchitis.  CTA chest showed inferolateral wall pseudoaneurysm, this is chronic.  Echo in 4/23 showed EF 40-45%, akinesis of the inferolateral wall, basal inferolateral pseudoaneurysm, moderate RV enlargement, normal RV systolic function, mild AS with mean gradient 10 mmHg.   Zio monitor in 5/23 showed 2% AF burden, improved from prior.     Follow up 5/23, stable NYHA II, euvolemic. Off several  meds due to orthostatic symptoms.  Admitted 6/23 with syncope, no evidence for ACS. 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 EP study with inducible VT. Underwent dual chamber Biotronik ICD placement. Discharged home, weight 208 lbs.  VT again in 9/23, treated with ATP.  Mexiletine recommended by EP.   Follow up 10/23, NYHA III and volume stable. GDMT limited by orthostatic symptoms and need for midodrine. Mexiletine started for recurrent VT.  S/p aortobi-iliac endograft repair of infrarenal abdominal aortic aneurysm 12/23. Type 2 endoleak noted on CTA in 1/24.   Patient was admitted in 1/24 with weakness, dyspnea, cough, atypical chest pain.  CTA chest showed not PE.  He ended up having cardiac cath, this showed no significant change to his coronary anatomy from the past.  Filling pressures were not elevated on RHC.  He was discharged home.   Echo in 2/24 showed EF 45-50%, basal-mid inferior/inferolateral akinesis, low normal RV function, moderate AS with mean gradient 11 and AVA 1.13 cm^2.   Admitted 3/24 with syncopal events, one event resulting in trauma to head and back. One event coincide with VT. Neuro consulted, suggested conservative non-op treatment, back brace for 6-8 weeks. Discharged on amiodarone taper.   Today he returns for post hospital follow up. Overall feeling pretty bad. Reports feeling ill and has significant SOB. SOB with ADLs. Naps frequently.  Worked with PT and was able to do laps around their driveway but that has decreased and is now only able to do laps in his house. Was walking with a cane now using a walker. Feels dizzy when he stands. Denies palpitations, CP, edema, or PND/Orthopnea. Appetite good. No fever or chills. Weight at home 202 pounds. Taking all medications. SPB 110s at home.   Biotronik device unable to be interrogated.   ECG (personally reviewed): a-paced, RBBB with QTc 478 msec   Labs (8/18): LDL 82, HDL  41, TSH normal, hgb 16.3, K 4.1, creatinine 1.05 Labs (2/20): LFTs normal, pro BNP 842 Labs (3/20): K 3.8, creatinine 1.48, TSH mildly elevated at 6.97, free T3 and free T4 normal Labs (4/20): K 3.9, creatinine 1.36 Labs (2/20): LDL 52 Labs (8/20): BNP 241, creatinine 1.21  Labs (9/20): BNP 244, K 4, creatinine 1.12 Labs (2/21): K 4.3, creatinine 1.26, BNP 271 Labs (6/21): K 4.2, creatinine 1.11, LDL 57 Labs (6/22): K 4.3, creatinine 1.16, LDL 49 Labs (8/22): K 4.3, creatinine 1.37 Labs (12/22): K 3.9, creatinine 1.18 Labs (4/23): K 4.7, creatinine 1.35, BNP 253 Labs (6/23): K 4.1, creatinine 0.95 => 1.28 Labs (10/23): K 4.4, creatinine 1.26 Labs (11/23): K 4.3, creatinine 1.28 Labs (1/24): K 4.2, creatinine 1.1, BNP 202, HS-TnI negative  Labs (3/24): K 4.1, SCr 1.49   PMH: 1. Essential tremor 2. CAD: s/p CABG in 3/12.   - Cardiolite (8/18): EF 52%, no ischemia/infarction.  - NSTEMI (1/20):  LHC showed new occlusion of the branch of SVG-ramus and OM that touched down on  OM.  There were also serial 70% stenoses in the mid/distal RCA.  FFR of RCA was negative.  - Cardiolite (12/21): EF 41%, no ischemia, fixed inferolateral defect - Cardiolite (3/23): LV EF 37%, fixed basal-mid inferolateral/inferior defect, no ischemia.  - LHC (1/24): Totally occluded SVG-PDA, occlusion of the branch of sequential SVG-ramus and OM that touches down on OM but patent ramus branch, occluded SVG-D, patent LIMA-LAD. Similar to prior, no intervention.  3. Atrial fibrillation: Only noted post-op CABG.  4. PE: Post-op CABG in 2012.  5. AAA: 3.6 cm on [place] in 3/14.  3.6 cm on [place] in 3/15. 3.6 cm on [place] 8/16. 4.1 cm on [place] 4/17.  - AAA [place] (5/18): 4.1 cm AAA.  - AAA [place] (5/19): 4.1 cm AAA.  - AAA [place] (4/20): 4.1 cm AAA.  - AAA [place] (9/21): 4.5 cm AAA - AAA [place] (9/22): 4.5 cm AAA - AAA [place] (10/23): 4.9 cm AAA - s/p EVAR 12/23, type 2 endoleak noted on 1/24 CTA.  6. OSA: On CPAP.  7. Carotid stenosis: Carotid dopplers  (9/14) with 60-79% LICA stenosis.  Carotid dopplers (9/15) with 60-79% LICA stenosis. Carotid dopplers (3/16) with 60-79% RCIA stenosis, 40-59% LICA stenosis.  - Carotid dopplers (8/16) with 40-59% RICA stenosis, 60-79% LICA stenosis.  - Carotid dopplers (8/17) with 60-79% RICA, 40-59% LICA. - Carotid dopplers (9/18) with 40-59% RICA, 60-79% LICA.   - Carotid dopplers (9/19) with 40-59% RICA, 60-79% LICA.  - Carotid dopplers (9/20) with 60-79% BICA - Carotid dopplers (9/21): 80-99% RICA, 60-79% LICA - Right TCAR 12/21, left TCAR 5/22.  - Carotid dopplers (4/23): patent ICA stents.  8. Asthma 9. PNA x 2 10. Chronic diastolic CHF/dyspnea: Echo (9/14) with EF 60-65%, mild LVH, very mild AS with mean gradient 10 mmHg, RV moderately dilated.  PFTs (3/14) showed mild obstructive airways disease.  V/Q scan (3/14) with no PE.  ENT workup for upper respiratory causes was negative.  RHC (2/15) with mean RA 7, PA 32/12, mean PCWP 13, CI 3.18.  CPX (3/15) with peak VO2 16.4, VE/VCO2 33; normal when compared to age-matched sedentary normals; chronotropic incompetence was noted.  Dyspnea was improved with weight loss.  - Echo (8/17): EF 60-65%, mild AS.  - Echo (1/20): EF 55-60%, normal RV size and systolic function.  - Echo (6/20): EF 55%, mild LVH, mild AS, normal RV size and systolic function, ?LA mass or mass impinging on posterior LA.  - TEE (7/20): EF 50-55%, mild LVH, basal inferolateral aneurysm, normal RV size/systolic function, loculated pleural fluid behind LA.  - Echo (1/22): EF 40-45%, basal to mid inferolateral AK, basal inferior HK, normal RV, mild aortic stenosis mean gradient 11 mmHg. - Echo (2/23): EF 45-50%, moderate LVH, normal RV, mild AS mean gradient 15 mmHg with IVC normal.  - Echo (4/23): EF 40-45%, akinesis of the inferolateral wall, basal inferolateral pseudoaneurysm, moderate RV enlargement, normal RV systolic function, mild AS with mean gradient 10 mmHg.  - Echo (6/23): EF 40-45%  and pre-existing aneurysm of the basal inferolateral wall, mild-mod AS. - RHC (1/24): mean RA 1, PA 19/10, mean PCWP 5, CI 2.66.  - Echo (2/24): EF 45-50%, basal-mid inferior/inferolateral akinesis, low normal RV function, moderate AS with mean gradient 11 and AVA 1.13 cm^2. 11. HTN 12. Aortic stenosis: Moderate on 2/24 echo.   13. Ascending aortic aneurysm: CTA chest with 4.4 cm ascending aorta in 1/20.  - 4.2 cm ascending aorta on CT chest 6/20.  14. Atrial fibrillation: Paroxysmal.  15. CKD: Stage 3.  16. Left lung spontaneous PTX then recurrent left pleural effusion.  - Left VATS in 7/20.  17. Palpitations:  - Zio patch (6/20): NSR with 1 short NSVT run and few short SVT runs, no atrial fibrillation.  - Zio patch 2 wks (2/21): 6 short NSVT runs, 66 short SVT runs, rare PACs/PVCs.  - Zio patch (5/23): 2% AF burden, 4 NSVT runs longest 5 beats.  18. COVID-19 infection 4/22.  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  20. VT: Syncope in 6/23 with inducible VT. s/p dual chamber Biotronik ICD  Current Outpatient Medications  Medication Sig Dispense Refill   amiodarone (PACERONE) 200 MG tablet Take 1 tablet (200 mg total) by mouth daily. 30 tablet 6   apixaban (ELIQUIS) 5 MG TABS tablet Take 1 tablet (5 mg total) by mouth 2 (two) times daily. 60 tablet    atorvastatin (LIPITOR) 80 MG tablet Take 1 tablet (80 mg total) by mouth daily. 30 tablet 6   cetirizine (ZYRTEC) 10 MG tablet Take 10 mg by mouth daily.     Cholecalciferol (VITAMIN D-3) 25 MCG (1000 UT) CAPS Take 1,000 Units by mouth in the morning.     finasteride (PROSCAR) 5 MG tablet Take 5 mg by mouth daily.     furosemide (LASIX) 20 MG tablet Take 1 tablet (20 mg total) by mouth daily. 90 tablet 3   isosorbide mononitrate (IMDUR) 30 MG 24 hr tablet Take 0.5 tablets (15 mg total) by mouth daily.     levothyroxine (SYNTHROID) 50 MCG tablet Take 50 mcg by mouth at bedtime.     metoprolol succinate (TOPROL XL) 25 MG 24 hr tablet  Take 1 tablet (25 mg total) by mouth daily.     metoprolol succinate (TOPROL XL) 25 MG 24 hr tablet Take 0.5 tablets (12.5 mg total) by mouth daily. Take 12.5mg  12 hours apart from 25mg  tablet dose 30 tablet 1   midodrine (PROAMATINE) 2.5 MG tablet Take 1 tablet (2.5 mg total) by mouth 2 (two) times daily with a meal. 180 tablet 3   Multiple Vitamin (MULTIVITAMIN WITH MINERALS) TABS tablet Take 1 tablet by mouth in the morning.     oxyCODONE (OXY IR/ROXICODONE) 5 MG immediate release tablet Take 1 tablet (5 mg total) by mouth every 4 (four) hours as needed for moderate pain. 30 tablet 0   pantoprazole (PROTONIX) 40 MG tablet Take 40 mg by mouth daily before breakfast.     spironolactone (ALDACTONE) 25 MG tablet Take 0.5 tablets (12.5 mg total) by mouth at bedtime. 15 tablet 2   tamsulosin (FLOMAX) 0.4 MG CAPS capsule Take 0.4 mg by mouth every evening.     No current facility-administered medications for this visit.   Allergies:   Ancef [cefazolin sodium], Empagliflozin, and Vancomycin   Social History:  The patient  reports that he quit smoking about 38 years ago. His smoking use included cigarettes. He has a 35.00 pack-year smoking history. He has never used smokeless tobacco. He reports current alcohol use of about 2.0 standard drinks of alcohol per week. He reports that he does not use drugs.   Family History:  The patient's family history includes Coronary artery disease in his mother; Heart disease in his mother; Hypertension in his mother.   ROS:  Please see the history of present illness.   All other systems are personally reviewed and negative.   Recent Labs: 11/28/2022: TSH 4.040 12/01/2022: ALT 11; B Natriuretic Peptide 284.1 12/03/2022: BUN 34; Creatinine, Ser  1.49; Hemoglobin 13.2; Magnesium 1.9; Platelets 135; Potassium 4.1; Sodium 135  Personally reviewed   There were no vitals taken for this visit.  Wt Readings from Last 3 Encounters:  12/23/22 93.9 kg (207 lb)  12/02/22 94.4  kg (208 lb 1.6 oz)  11/04/22 92.5 kg (204 lb)    Physical exam: General:  elderly appearing.  arrives via WC HEENT: normal Neck: supple. JVD flat Carotids 2+ bilat; no bruits. No lymphadenopathy or thyromegaly appreciated. Cor: PMI nondisplaced. Regular rate & rhythm. No rubs, gallops or murmurs. Lungs: clear Abdomen: soft, nontender, nondistended. No hepatosplenomegaly. No bruits or masses. Good bowel sounds. Extremities: no cyanosis, clubbing, rash, edema  Neuro: alert & oriented x 3, cranial nerves grossly intact. moves all 4 extremities w/o difficulty. Affect pleasant.   EKG: A-paced rhythm with prolonged AV conduction. RBBB 68 bpm (Personally reviewed)    ReDS: 25%  Assessment & Plan: 1. Syncope: 6/23, most likely cardiogenic. Known tachy-brady syndrome with outpatient monitor showing post-conversion (AF => NSR) symptomatic pauses, up to 3 sec long. Has bifascicular block.  EP study in 6/23 w/ inducible VT. Biotronik ICD placed.  - recently admitted 2/2 syncope. One event correlated with VT.  - Now on amiodarone 200mg  daily (prior h/o amiodarone toxicity). Feeling SOB and weak, he suspects its 2/2 amio. Has EP appt 5/13. For now side effects trump going back into VT. Would continue and let EP manage. Of note, he states he would rather the side effects from mexiletine that amio.  - Check TSH, Free T3 Free T4 3 months after starting amiodarone. Check ESR today 2. Chronic HF with mid range EF: TEE in 7/20 showed EF 50-55%, basal inferolateral aneurysm, and normal RV.  Echo in 1/22 with EF 40-45%, basal-mid inferolateral akinesis and inferior hypokinesis, normal RV.  Echo in 6/23 showed EF 40-45% and pre-existing aneurysm of the basal inferolateral wall, mild-mod AS.  RHC in 1/24 with normal filling pressures and preserved cardiac output.  Echo in 2/24 showed EF 45-50%, basal-mid inferior/inferolateral akinesis, low normal RV function, moderate AS with mean gradient 11 and AVA 1.13 cm^2.  NYHA  class III symptoms, not volume overloaded on exam, ReDS 25%. GDMT has been very limited by orthostatic symptoms. Suspect deconditioning and lung disease (recurrent left effusion and h/o PTX with likely scarring) play a role in his dyspnea, along with the recent addition of amio.  - Continue Toprol XL 25 mg daily.  - Continue Lasix 20 mg daily. BMET today. - Continue spironolactone 12.5 mg qhs. - Continue low dose midodrine for now with orthostatic symptoms.  - Off ARB due to orthostatic hypotension. - Unable to tolerate dapaglifozin. - He needs to wear graded compression stockings during the day.  - With syncope, Afib, flutter, sinus pauses up to 3 sec, VT, orthostatic hypotension requiring midodrine, recurrent pleural effusions requiring thoracentesis x 3, mod AS and spinal stenosis concern for amyloid. Discussed with Dr. Shirlee Latch, will order PYP scan, multiple myeloma panel, immunofixation, urine, kappa lambda light chain, SPEP, and IFE  - Would like to do cardiac rehab once breathing has improved.  3. CAD: s/p CABG 3/12. NSTEMI 1/20, LHC showed new occlusion of the branch of sequential SVG-ramus and OM that touched down on OM.  There were also serial 70% stenoses in the mid/distal RCA.  FFR of RCA was negative.  No interventional target.  Cardiolite in 12/21 showed inferolateral infarction with no ischemia, consistent with findings on NSTEMI in 1/20. Cardiolite in 3/23 showed inferior/inferolateral fixed defect  with no ischemia.  LHC in 1/24 showed unchanged coronary anatomy with occluded branch of sequential SVG-ramus and OM touching down on OM, patent LIMA-LAD, occluded SVG-D, occluded SVG-PDA; no intervention.  He has not had chest pain since leaving the hospital in 1/24.  - No ASA given Eliquis use.  - Continue atorvastatin 80 daily.   - Continue Imdur 15 mg daily.  - Continue Toprol XL 25 mg daily.  4. Hyperlipidemia: Continue statin, LDL 53 2/24 5. Spontaneous left PTX with recurrent  left-sided pleural effusion:  He is s/p left VATS/pleurodesis in 7/20.    6. Carotid stenosis: S/p bilateral TCARs, followed at [facility].     7. AAA: 4.9 cm AAA in 10/23.  S/p EVAR 12/23.  Has type 2 endoleak by CTA in 1/24.  - Follow at [facility].  8. Aortic stenosis: Moderate by echo in 2/24.   - With his profound exertional symptoms, would consider him for trial of TAVR in moderate AS when/if this becomes an option at our center.  9. Atrial fibrillation: Paroxysmal. He is significantly symptomatic while in atrial fibrillation. He was taken off amiodarone due to concern for pulmonary toxicity.  He saw EP, did not recommend ablation. Not candidate for sotalol per EP.  NSR (a-paced) today.   - Continue Eliquis 5 mg bid. CBC today. - Continue amiodarone 200 mg daily 10. 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  This has been stable.  11. VT: Recurrent, suspect scar in the inferolateral wall is nidus.  Has Biotronik ICD.  He had VT again in 9/23, not associated with chest pain/ischemic symptoms.  VT noted with syncopal episode 11/27/22. He was on amiodarone in the past but developed possible pulmonary toxicity. EP saw patient during last admission and suspected sotalol was no longer a good option for pt. Suggested to re-challenge amiodarone and stop mexiletine (as pt felt horrible on it).  - Continue Toprol XL 25 mg daily.  - Continue amiodarone 200 mg daily - r/o amyloid as above 12. CKD stage 3: BMET today.   Followup 4-6 weeks with Dr. Shirlee Latch.   Alen Bleacher AGACNP-BC  01/29/2023

## 2023-01-29 NOTE — Progress Notes (Signed)
ReDS Vest / Clip - 01/29/23 1200       ReDS Vest / Clip   Station Marker D    Ruler Value 25    ReDS Value Range Low volume    ReDS Actual Value 25

## 2023-01-29 NOTE — Patient Instructions (Addendum)
Medication Changes:  We recommend that you continue on your current medications as directed. Please refer to the Current Medication list given to you today.   *If you need a refill on your cardiac medications before your next appointment, please call your pharmacy*  Lab Work:  Labs done today, your results will be available in MyChart, we will contact you for abnormal readings.   Follow-Up in:   Your physician recommends that you schedule a follow-up appointment in: 4-6 weeks with Dr. Shirlee Latch    Do the following things EVERYDAY: Weigh yourself in the morning before breakfast. Write it down and keep it in a log. Take your medicines as prescribed Eat low salt foods--Limit salt (sodium) to 2000 mg per day.  Stay as active as you can everyday Limit all fluids for the day to less than 2 liters    Need to Contact us:  If you have any questions or concerns before your next appointment please send [place] a message through [place] or call our office at [phone].    TO LEAVE A MESSAGE FOR THE NURSE SELECT OPTION 2, PLEASE LEAVE A MESSAGE INCLUDING: YOUR NAME DATE OF BIRTH CALL BACK NUMBER REASON FOR CALL**this is important as we prioritize the call backs  YOU WILL RECEIVE A CALL BACK THE SAME DAY AS LONG AS YOU CALL BEFORE 4:00 PM   At the [facility], you and your health needs are our priority. As part of our continuing mission to provide you with exceptional heart care, we have created designated Provider Care Teams. These Care Teams include your primary Cardiologist (physician) and Advanced Practice Providers (APPs- Physician Assistants and Nurse Practitioners) who all work together to provide you with the care you need, when you need it.   You may see any of the following providers on your designated Care Team at your next follow up: Dr Arvilla Meres Dr Marca Ancona Dr. Marcos Eke, NP Robbie Lis, [place] [facility],  [place] Brynda Peon, NP Karle Plumber, PharmD   Please be sure to bring in all your medications bottles to every appointment.    Thank you for choosing [facility]

## 2023-01-30 LAB — KAPPA/LAMBDA LIGHT CHAINS
Kappa free light chain: 77.4 mg/L — ABNORMAL HIGH (ref 3.3–19.4)
Kappa, lambda light chain ratio: 2.14 — ABNORMAL HIGH (ref 0.26–1.65)
Lambda free light chains: 36.2 mg/L — ABNORMAL HIGH (ref 5.7–26.3)

## 2023-02-02 ENCOUNTER — Telehealth (HOSPITAL_COMMUNITY): Payer: Self-pay | Admitting: *Deleted

## 2023-02-02 ENCOUNTER — Ambulatory Visit: Payer: Medicare Other | Attending: Student | Admitting: Student

## 2023-02-02 ENCOUNTER — Other Ambulatory Visit: Payer: Self-pay

## 2023-02-02 ENCOUNTER — Ambulatory Visit (HOSPITAL_COMMUNITY): Payer: Medicare Other

## 2023-02-02 ENCOUNTER — Encounter: Payer: Self-pay | Admitting: Student

## 2023-02-02 VITALS — BP 116/68 | HR 74 | Ht 75.0 in | Wt 205.0 lb

## 2023-02-02 DIAGNOSIS — I472 Ventricular tachycardia, unspecified: Secondary | ICD-10-CM | POA: Diagnosis not present

## 2023-02-02 DIAGNOSIS — R55 Syncope and collapse: Secondary | ICD-10-CM | POA: Insufficient documentation

## 2023-02-02 DIAGNOSIS — I5022 Chronic systolic (congestive) heart failure: Secondary | ICD-10-CM | POA: Insufficient documentation

## 2023-02-02 DIAGNOSIS — I2510 Atherosclerotic heart disease of native coronary artery without angina pectoris: Secondary | ICD-10-CM | POA: Diagnosis not present

## 2023-02-02 DIAGNOSIS — I480 Paroxysmal atrial fibrillation: Secondary | ICD-10-CM | POA: Insufficient documentation

## 2023-02-02 LAB — CUP PACEART INCLINIC DEVICE CHECK
Date Time Interrogation Session: 20240513131335
Implantable Lead Connection Status: 753985
Implantable Lead Connection Status: 753985
Implantable Lead Implant Date: 20230606
Implantable Lead Implant Date: 20230606
Implantable Lead Location: 753859
Implantable Lead Location: 753860
Implantable Lead Model: 377
Implantable Lead Model: 402266
Implantable Lead Serial Number: 8000807784
Implantable Lead Serial Number: 81503989
Implantable Pulse Generator Implant Date: 20230606
Pulse Gen Model: 429534
Pulse Gen Serial Number: 84900693

## 2023-02-02 MED ORDER — FUROSEMIDE 20 MG PO TABS: 20.0000 mg | ORAL_TABLET | ORAL | 3 refills | Status: DC | PRN

## 2023-02-02 NOTE — Progress Notes (Signed)
Electrophysiology Office Note:   Date:  02/02/2023  ID:  CORTES YUHASZ, DOB 07/19/1931, MRN [id number]  Primary Cardiologist: None Electrophysiologist: Lanier Prude, MD   History of Present Illness:   Eric Gordon is a 89 y.o. male with h/o chronic systolic HF, VT with Biotronik ICD, CAD s/p CABG, Chronic anticoagulation with eliquis seen today for routine electrophysiology followup.   Admitted 3/11 - 3/14 with fall, syncope, and T9 vertebral fracture. He had VT treated by device on 3/7 associated with syncopal episode.  He has not further VT on ICD interrogation.  He is fall from 3/9 did not appear to be associated with VT.    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   Was doing fairly well in office visit 4/2 for f/u.   Seen in HF clinic 5/9 and complained of SOB and overall feeling terrible. Slight AKI noted. ESR mildly positive at 30 mm/hr. No thyroid or liver panels.    Lasix decreased.   Since last being seen in our clinic the patient reports doing poorly, especially for the last 2-3 weeks. He was progressing with PT and doing 6 "laps around the house" with a cane. Now has become increasingly SOB and can only make 3-4 with a walker. No syncope or further falls. SOB with any activity. Denies edema. States this is the way he felt when he was previously on amiodarone.   Review of systems complete and found to be negative unless listed in HPI.   Device History: Biotronik Dual Chamber ICD implanted 02/25/2022 for VT and ICM History of appropriate therapy: Yes History of AAD therapy: Previously taken off amiodarone with concerns for pulm tox (for AF) Resumed 11/2022 but again had symptoms of severe malaise and SOB  Studies Reviewed:    ICD Interrogation-  reviewed in detail today,  See PACEART report.  EKG is not ordered today     Physical Exam:   VS:  BP 116/68   Pulse 74   Ht 6\' 3"  (1.905 m)   Wt 205 lb (93 kg)   SpO2 95%   BMI 25.62 kg/m    Wt Readings from Last 3 Encounters:  02/02/23 205 lb (93 kg)  01/29/23 205 lb 9.6 oz (93.3 kg)  12/23/22 207 lb (93.9 kg)     GEN: Well nourished, well developed in no acute distress NECK: No JVD; No carotid bruits CARDIAC: Regular rate and rhythm, no murmurs, rubs, gallops RESPIRATORY:  Clear to auscultation without rales, wheezing or rhonchi  ABDOMEN: Soft, non-tender, non-distended EXTREMITIES:  No edema; No deformity   ASSESSMENT AND PLAN:    Chronic systolic dysfunction s/p Biotronik dual chamber ICD  euvolemic today Stable on an appropriate medical regimen Normal ICD function See Pace Art report No changes today  Syncope VT No further VT on ICD interrogation. Had 1 NSVT mid april Continue toprol 25 mg daily Will stop amiodarone due to profound malaise and worsening SOB.  Unfortunately, he has a quite advanced cardiomyopathy and few options for escalation of treatment which we discussed at length today.  Will resume mexitil 150 mg BID (he also previously felt poorly on this).  He understands he does not have other medication options for his VT.  He, wife, and son verbalize understanding that the risk of being off amiodarone is a potentially life threatening rhythm, and his symptoms may  also be the expression of his late stage cardiomyopathy.    HFrmEF ICM Echo 45-50% 10/2022 GDMT significantly limited by hypotension OK to titrate midodrine up as needed/tolerated. Lasix changed to as needed with recent AKI.    CAD Cath 09/2022 with 2 of 5 patent grafts Denies s/s ischemia   PAF Continue eliquis for CHA2DS2VASc of at least 4 Well controlled overall.    Orthostasis Has had several falls and near falls NOT associated with VT.  Continue midodrine 2.5 mg BID. Can titrate as needed.  Disposition:   Follow up with EP APP  2-3 months.     Signed, Graciella Freer, PA-C

## 2023-02-02 NOTE — Telephone Encounter (Signed)
Called patients' wife per Brynda Peon, NP with following lab results and instructions:  "Renal function slightly elevated. Stop daily lasix and change to as needed for overnight weight gain or swelling. Repeat BMET 7-10 days. Will forward Sed Rate to EP to see for Monday's f/u."  Wife verbalized understanding of same. Repeat lab scheduled and ordered.

## 2023-02-02 NOTE — Patient Instructions (Addendum)
Medication Instructions:  1.Stop amiodarone 2.Start mexiletine (Mexitil) 150 mg twice daily *If you need a refill on your cardiac medications before your next appointment, please call your pharmacy*  Lab Work: None ordered If you have labs (blood work) drawn today and your tests are completely normal, you will receive your results only by: MyChart Message (if you have MyChart) OR A paper copy in the mail If you have any lab test that is abnormal or we need to change your treatment, we will call you to review the results.  Follow-Up: At [facility], you and your health needs are our priority.  As part of our continuing mission to provide you with exceptional heart care, we have created designated Provider Care Teams.  These Care Teams include your primary Cardiologist (physician) and Advanced Practice Providers (APPs -  Physician Assistants and Nurse Practitioners) who all work together to provide you with the care you need, when you need it.  Your next appointment:   2 month(s)  Provider:   Casimiro Needle "Otilio Saber, PA-C

## 2023-02-03 ENCOUNTER — Encounter: Payer: No Typology Code available for payment source | Admitting: Cardiology

## 2023-02-04 LAB — PROTEIN ELECTROPHORESIS, SERUM
A/G Ratio: 1.1 (ref 0.7–1.7)
Albumin ELP: 3.8 g/dL (ref 2.9–4.4)
Alpha-1-Globulin: 0.3 g/dL (ref 0.0–0.4)
Alpha-2-Globulin: 0.9 g/dL (ref 0.4–1.0)
Beta Globulin: 1.2 g/dL (ref 0.7–1.3)
Gamma Globulin: 1.3 g/dL (ref 0.4–1.8)
Globulin, Total: 3.6 g/dL (ref 2.2–3.9)
Total Protein ELP: 7.4 g/dL (ref 6.0–8.5)

## 2023-02-05 DIAGNOSIS — S22078A Other fracture of T9-T10 vertebra, initial encounter for closed fracture: Secondary | ICD-10-CM | POA: Diagnosis not present

## 2023-02-05 LAB — MULTIPLE MYELOMA PANEL, SERUM
Albumin SerPl Elph-Mcnc: 3.9 g/dL (ref 2.9–4.4)
Albumin/Glob SerPl: 1.1 (ref 0.7–1.7)
Alpha 1: 0.3 g/dL (ref 0.0–0.4)
Alpha2 Glob SerPl Elph-Mcnc: 1 g/dL (ref 0.4–1.0)
B-Globulin SerPl Elph-Mcnc: 1.2 g/dL (ref 0.7–1.3)
Gamma Glob SerPl Elph-Mcnc: 1.3 g/dL (ref 0.4–1.8)
Globulin, Total: 3.7 g/dL (ref 2.2–3.9)
IgA: 288 mg/dL (ref 61–437)
IgG (Immunoglobin G), Serum: 1286 mg/dL (ref 603–1613)
IgM (Immunoglobulin M), Srm: 83 mg/dL (ref 15–143)
Total Protein ELP: 7.6 g/dL (ref 6.0–8.5)

## 2023-02-05 LAB — IFE+PROTEIN ELECTRO, 24-HR UR
% BETA, Urine: 0 %
ALBUMIN, U: 100 %
ALPHA 1 URINE: 0 %
ALPHA-2-GLOBULIN, U: 0 %
GAMMA GLOBULIN URINE: 0 %
Protein, 24H Urine: 205 mg/24 hr — ABNORMAL HIGH (ref 30–150)
Protein, Ur: 8.2 mg/dL

## 2023-02-10 ENCOUNTER — Encounter (HOSPITAL_COMMUNITY)
Admission: RE | Admit: 2023-02-10 | Discharge: 2023-02-10 | Disposition: A | Discharge status: Home / Self Care | Payer: Medicare Other | Source: Ambulatory Visit | Attending: Internal Medicine | Admitting: Internal Medicine

## 2023-02-10 DIAGNOSIS — I5022 Chronic systolic (congestive) heart failure: Secondary | ICD-10-CM | POA: Diagnosis not present

## 2023-02-10 DIAGNOSIS — I509 Heart failure, unspecified: Secondary | ICD-10-CM | POA: Diagnosis not present

## 2023-02-10 MED ORDER — TECHNETIUM TC 99M PYROPHOSPHATE
20.2000 | Freq: Once | INTRAVENOUS | Status: AC | PRN
  Administered 2023-02-10: 20.2 via INTRAVENOUS
  Filled 2023-02-10: qty 21

## 2023-02-11 ENCOUNTER — Ambulatory Visit (HOSPITAL_COMMUNITY)
Admission: RE | Admit: 2023-02-11 | Discharge: 2023-02-11 | Disposition: A | Discharge status: Home / Self Care | Payer: Medicare Other | Source: Ambulatory Visit | Attending: Cardiology | Admitting: Cardiology

## 2023-02-11 DIAGNOSIS — I5022 Chronic systolic (congestive) heart failure: Secondary | ICD-10-CM | POA: Insufficient documentation

## 2023-02-11 LAB — BASIC METABOLIC PANEL
Anion gap: 8 (ref 5–15)
BUN: 30 mg/dL — ABNORMAL HIGH (ref 8–23)
CO2: 26 mmol/L (ref 22–32)
Calcium: 9.4 mg/dL (ref 8.9–10.3)
Chloride: 102 mmol/L (ref 98–111)
Creatinine, Ser: 1.89 mg/dL — ABNORMAL HIGH (ref 0.61–1.24)
GFR, Estimated: 34 mL/min — ABNORMAL LOW (ref >60)
Glucose, Bld: 82 mg/dL (ref 70–99)
Potassium: 4.5 mmol/L (ref 3.5–5.1)
Sodium: 136 mmol/L (ref 135–145)

## 2023-02-18 ENCOUNTER — Telehealth (HOSPITAL_COMMUNITY): Payer: Self-pay

## 2023-02-18 NOTE — Telephone Encounter (Signed)
-----   Message from Alen Bleacher, NP sent at 02/17/2023  4:39 PM EDT ----- PET scan not suggestive of amyloidosis

## 2023-02-18 NOTE — Telephone Encounter (Signed)
Patient notified of PET scan results.

## 2023-02-24 ENCOUNTER — Encounter: Payer: Self-pay | Admitting: Neurology

## 2023-02-25 ENCOUNTER — Ambulatory Visit (INDEPENDENT_AMBULATORY_CARE_PROVIDER_SITE_OTHER): Payer: Medicare Other

## 2023-02-25 DIAGNOSIS — I472 Ventricular tachycardia, unspecified: Secondary | ICD-10-CM | POA: Diagnosis not present

## 2023-02-25 LAB — CUP PACEART REMOTE DEVICE CHECK
Battery Voltage: 3.01 V
Brady Statistic RA Percent Paced: 91 %
Brady Statistic RV Percent Paced: 1 %
Date Time Interrogation Session: 20240605083509
HighPow Impedance: 84 Ohm
Implantable Lead Connection Status: 753985
Implantable Lead Connection Status: 753985
Implantable Lead Implant Date: 20230606
Implantable Lead Implant Date: 20230606
Implantable Lead Location: 753859
Implantable Lead Location: 753860
Implantable Lead Model: 377
Implantable Lead Model: 402266
Implantable Lead Serial Number: 8000807784
Implantable Lead Serial Number: 81503989
Implantable Pulse Generator Implant Date: 20230606
Lead Channel Impedance Value: 500 Ohm
Lead Channel Impedance Value: 578 Ohm
Lead Channel Pacing Threshold Amplitude: 0.7 V
Lead Channel Pacing Threshold Amplitude: 1 V
Lead Channel Pacing Threshold Pulse Width: 0.4 ms
Lead Channel Pacing Threshold Pulse Width: 0.4 ms
Lead Channel Sensing Intrinsic Amplitude: 1.7 mV
Lead Channel Sensing Intrinsic Amplitude: 5.4 mV
Pulse Gen Model: 429534
Pulse Gen Serial Number: 84900693
Zone Setting Status: 755011

## 2023-02-25 NOTE — Progress Notes (Signed)
Initial neurology clinic note  Eric Gordon MRN: [id number] DOB: [birth year]  Referring provider: Tenna Delaine, MD  Primary care provider: Creola Corn, MD  Reason for consult:  left sided weakness  Subjective:  This is Mr. Eric Gordon, a 89 y.o. right-handed male with a medical history of afib (on eliquis), CAD s/p CABG, carotid stenosis s/p b/l stent, HTN, HLD, CHF, hypothyroidism, essential tremor who presents to neurology clinic with left sided weakness. The patient is accompanied by wife and son.  Patient is not sure when his symptoms started. He noticed numbness in the left leg for the last month or so. He did not notice left sided weakness. When he was at his PCP appointment at the end of May 2024, there was left sided weakness found. Left arm was also noted to be weak. He denies any numbness in left arm. He had a CT head on 02/18/23 that showed no obvious stroke. Patient cannot get MRI due to defibrillator. The family did not notice a significant change in the patient other than him mentioning the left leg numbness. Patient is on eliquis 5 mg BID for afib and atorvastatin 80 mg daily for HLD.  At baseline, patient walks with a walker or cane. He had 2 falls about 3 months ago and fractured the T9 vertebra. Per patient, he passed out but there has been no clear cause found. He has not had further episodes. Patient saw neurosurgery for his vertebra fracture, given a brace that he wore for 3 months, and recovered somewhat. He still has some pain in his mid and low back, but no neck pain. Patient is currently doing PT.  Patient has had tremor, diagnosed as essential tremor. It was initially affecting the right arm, but the left hand started about 1 month ago. He does not know of a family history of tremor. He has not noticed if alcohol helps his tremor. He had treatment years ago, but does not know what. He is currently on metoprolol. His tremor has been worsening,  especially in his right hand, affecting his eating. He was also prescribed primidone 25 mg qhs by PCP on 02/18/23, but patient read side effects and has not taken it.   Patient is a non-smoker. He rarely will have a vodka or beer. There is no drug use.   MEDICATIONS:  Outpatient Encounter Medications as of 03/05/2023  Medication Sig   apixaban (ELIQUIS) 5 MG TABS tablet Take 1 tablet (5 mg total) by mouth 2 (two) times daily.   atorvastatin (LIPITOR) 80 MG tablet Take 1 tablet (80 mg total) by mouth daily.   cetirizine (ZYRTEC) 10 MG tablet Take 10 mg by mouth daily.   finasteride (PROSCAR) 5 MG tablet Take 5 mg by mouth daily.   furosemide (LASIX) 20 MG tablet Take 1 tablet (20 mg total) by mouth as needed (for overnight weight gain or swelling).   isosorbide mononitrate (IMDUR) 30 MG 24 hr tablet Take 0.5 tablets (15 mg total) by mouth daily.   levothyroxine (SYNTHROID) 50 MCG tablet Take 50 mcg by mouth at bedtime.   metoprolol succinate (TOPROL XL) 25 MG 24 hr tablet Take 1 tablet (25 mg total) by mouth daily.   metoprolol succinate (TOPROL-XL) 25 MG 24 hr tablet Take 25 mg by mouth daily.   mexiletine (MEXITIL) 150 MG capsule Take 150 mg by mouth 2 (two) times daily.   midodrine (PROAMATINE) 2.5 MG tablet Take 1 tablet (2.5 mg total) by mouth 2 (two)  times daily with a meal.   Multiple Vitamin (MULTIVITAMIN WITH MINERALS) TABS tablet Take 1 tablet by mouth in the morning.   pantoprazole (PROTONIX) 40 MG tablet Take 40 mg by mouth daily before breakfast.   spironolactone (ALDACTONE) 25 MG tablet Take 0.5 tablets (12.5 mg total) by mouth at bedtime.   Cholecalciferol (VITAMIN D-3) 25 MCG (1000 UT) CAPS Take 1,000 Units by mouth in the morning. (Patient not taking: Reported on 03/05/2023)   tamsulosin (FLOMAX) 0.4 MG CAPS capsule Take 0.4 mg by mouth every evening. (Patient not taking: Reported on 03/05/2023)   [DISCONTINUED] oxyCODONE (OXY IR/ROXICODONE) 5 MG immediate release tablet Take 1  tablet (5 mg total) by mouth every 4 (four) hours as needed for moderate pain. (Patient not taking: Reported on 03/05/2023)   No facility-administered encounter medications on file as of 03/05/2023.    PAST MEDICAL HISTORY: Past Medical History:  Diagnosis Date   Abdominal aortic aneurysm (AAA) (HCC)    Achilles tendinitis of right lower extremity    AICD (automatic cardioverter/defibrillator) present    Arthritis    "my whole body" (03/19/2018)   Atherosclerosis of coronary artery bypass graft w/o angina pectoris    Atrial fibrillation (HCC)    Barrett's esophagus    CAD (coronary artery disease)    Carotid artery disease (HCC)    CHF (congestive heart failure) (HCC)    Chronic anticoagulation    PE   Chronic lower back pain    Dyspnea    Dysrhythmia    Essential tremor    GERD (gastroesophageal reflux disease)    High cholesterol    Hyperlipidemia    Hyperplasia, prostate    Hypertension    Hypothyroidism    Myocardial infarction (HCC)    2 in Jan. 2019   Nail dystrophy    OSA on CPAP    uses CPAP   Osteoarthrosis    Pneumonia    "now and once before" (03/19/2018)   Pulmonary embolism [facility])    April 2012 after CABG   S/P CABG (coronary artery bypass graft)    Sleep apnea    Spontaneous pneumothorax    left spontaneous pneumothorax/left pleural effusion, s/p chest tube 01/09/19; thoracentesis x2, s/p left VATS/tacl pleurodesis 04/12/19    PAST SURGICAL HISTORY: Past Surgical History:  Procedure Laterality Date   ABDOMINAL AORTIC ENDOVASCULAR STENT GRAFT N/A 08/06/2022   Procedure: ABDOMINAL AORTIC ENDOVASCULAR STENT GRAFT;  Surgeon: Nada Libman, MD;  Location: [facility] OR;  Service: Vascular;  Laterality: N/A;   ACHILLES TENDON REPAIR Bilateral    2006 & 2004   CARDIAC CATHETERIZATION  11/2010   CORONARY ANGIOGRAPHY N/A 10/22/2018   Procedure: CORONARY ANGIOGRAPHY;  Surgeon: Lennette Bihari, MD;  Location: [facility] INVASIVE CV LAB;  Service: Cardiovascular;  Laterality:  N/A;   CORONARY ARTERY BYPASS GRAFT  March 2012   CABG X 5   CORONARY PRESSURE/FFR STUDY N/A 10/22/2018   Procedure: INTRAVASCULAR PRESSURE WIRE/FFR STUDY;  Surgeon: Lennette Bihari, MD;  Location: [facility] INVASIVE CV LAB;  Service: Cardiovascular;  Laterality: N/A;   ESOPHAGOGASTRODUODENOSCOPY (EGD) WITH PROPOFOL N/A 12/10/2020   Procedure: ESOPHAGOGASTRODUODENOSCOPY (EGD) WITH PROPOFOL;  Surgeon: Sherrilyn Rist, MD;  Location: [facility] ENDOSCOPY;  Service: Gastroenterology;  Laterality: N/A;   ICD IMPLANT N/A 02/25/2022   Procedure: ICD IMPLANT;  Surgeon: Lanier Prude, MD;  Location: [facility] INVASIVE CV LAB;  Service: Cardiovascular;  Laterality: N/A;   INGUINAL HERNIA REPAIR Left 1980   IR THORACENTESIS ASP PLEURAL SPACE W/IMG  GUIDE  02/04/2019   IR THORACENTESIS ASP PLEURAL SPACE W/IMG GUIDE  02/24/2019   KNEE ARTHROSCOPY Left 2006   LEFT HEART CATH AND CORS/GRAFTS ANGIOGRAPHY N/A 10/18/2018   Procedure: LEFT HEART CATH AND CORS/GRAFTS ANGIOGRAPHY;  Surgeon: Runell Gess, MD;  [facility]: [facility] INVAS[facility] CV LAB;  Service: Cardiovascular;  Laterality: N/A;   LEFT HEART CATH AND CORS/GRAFTS ANGIOGRAPHY N/A 10/21/2018   Procedure: LEFT HEART CATH AND CORS/GRAFTS ANGIOGRAPHY;  Surgeon: Lennette Bihari, MD;  Location: [facility] INVASIVE CV LAB;  Service: Cardiovascular;  Laterality: N/A;   PACEMAKER IMPLANT N/A 02/24/2022   Procedure: PACEMAKER IMPLANT;  Surgeon: Duke Salvia, MD;  Location: [facility] INVASIVE CV LAB;  Service: Cardiovascular;  Laterality: N/A;   PENILE PROSTHESIS IMPLANT     PLEURAL BIOPSY Left 04/12/2019   Procedure: Pleural Biopsy;  Surgeon: Delight Ovens, MD;  Location: [facility] OR;  Service: Thoracic;  Laterality: Left;   PLEURAL EFFUSION DRAINAGE Left 04/12/2019   Procedure: Drainage Of Pleural Effusion;  Surgeon: Delight Ovens, MD;  Location: [facility] OR;  Service: Thoracic;  Laterality: Left;   RIGHT HEART CATHETERIZATION N/A 11/14/2013   Procedure: RIGHT HEART CATH;  Surgeon: Laurey Morale, MD;   Location: [facility] CATH LAB;  Service: Cardiovascular;  Laterality: N/A;   RIGHT/LEFT HEART CATH AND CORONARY/GRAFT ANGIOGRAPHY N/A 10/15/2022   Procedure: RIGHT/LEFT HEART CATH AND CORONARY/GRAFT ANGIOGRAPHY;  Surgeon: Kathleene Hazel, MD;  Location: [facility] INVASIVE CV LAB;  Service: Cardiovascular;  Laterality: N/A;   SHOULDER OPEN ROTATOR CUFF REPAIR Right 2003   TALC PLEURODESIS Left 04/12/2019   Procedure: Lurlean Nanny;  Surgeon: Delight Ovens, MD;  Location: [facility] OR;  Service: Thoracic;  Laterality: Left;   TEE WITHOUT CARDIOVERSION N/A 03/24/2019   Procedure: TRANSESOPHAGEAL ECHOCARDIOGRAM (TEE);  Surgeon: Laurey Morale, MD;  Location: [facility] ENDOSCOPY;  Service: Cardiovascular;  Laterality: N/A;   TEE WITHOUT CARDIOVERSION  04/12/2019   Procedure: Transesophageal Echocardiogram (Tee);  Surgeon: Delight Ovens, MD;  Location: [facility] OR;  Service: Thoracic;;   THORACOSCOPY Left 04/12/2019   VIDEO BRONCHOSCOPY (N/A ) VIDEO ASSISTED THORACOSCOPY (Left Chest) TALC PLEURADESIS (Left    TRANSCAROTID ARTERY REVASCULARIZATION  Right 09/13/2020   Procedure: RIGHT TRANSCAROTID ARTERY REVASCULARIZATION USING 9mm X 40mm ENROUTE TRANSCAROTID STEND SYSTEM;  Surgeon: Nada Libman, MD;  Location: [facility] OR;  Service: Vascular;  Laterality: Right;   TRANSCAROTID ARTERY REVASCULARIZATION  Left 02/08/2021   Procedure: LEFT TRANSCAROTID ARTERY REVASCULARIZATION;  Surgeon: Nada Libman, MD;  Location: [facility] OR;  Service: Vascular;  Laterality: Left;   ULTRASOUND GUIDANCE FOR VASCULAR ACCESS  09/13/2020   Procedure: ULTRASOUND GUIDANCE FOR VASCULAR ACCESS;  Surgeon: Nada Libman, MD;  Location: [facility] OR;  Service: Vascular;;   ULTRASOUND GUIDANCE FOR VASCULAR ACCESS Bilateral 08/06/2022   Procedure: ULTRASOUND GUIDANCE FOR VASCULAR ACCESS;  Surgeon: Nada Libman, MD;  Location: [facility] OR;  Service: Vascular;  Laterality: Bilateral;   VIDEO ASSISTED THORACOSCOPY Left 04/12/2019   Procedure: VIDEO ASSISTED  THORACOSCOPY;  Surgeon: Delight Ovens, MD;  Location: [facility] OR;  Service: Thoracic;  Laterality: Left;   VIDEO BRONCHOSCOPY N/A 04/12/2019   Procedure: VIDEO BRONCHOSCOPY;  Surgeon: Delight Ovens, MD;  Location: [facility] OR;  Service: Thoracic;  Laterality: N/A;    ALLERGIES: Allergies  Allergen Reactions   Ancef [Cefazolin Sodium] Hives   Empagliflozin Other (See Comments)    Dizziness   Vancomycin Rash    FAMILY HISTORY: Family History  Problem Relation Age of Onset   Coronary  artery disease Mother    Hypertension Mother    Heart disease Mother     SOCIAL HISTORY: Social History   Tobacco Use   Smoking status: Former    Packs/day: 1.00    Years: 35.00    Additional pack years: 0.00    Total pack years: 35.00    Types: Cigarettes    Quit date: 02/18/1984    Years since quitting: 39.0   Smokeless tobacco: Never  Vaping Use   Vaping Use: Never used  Substance Use Topics   Alcohol use: Yes    Alcohol/week: 2.0 standard drinks of alcohol    Types: 2 Standard drinks or equivalent per week    Comment: Occ   Drug use: Never   Social History   Social History Narrative   Right handed     Objective:  Vital Signs:  BP 135/65   Pulse 65   Ht 6\' 3"  (1.905 m)   Wt 200 lb (90.7 kg)   SpO2 93%   BMI 25.00 kg/m   General: General appearance: Awake and alert. No distress. Cooperative with exam.  Skin: No obvious rash or jaundice. HEENT: Atraumatic. Anicteric. Lungs: Non-labored breathing on room air  Heart: Regular. No carotid bruits Extremities: No edema. Psych: Affect appropriate.  Neurological: Mental Status: Alert. Speech fluent. No pseudobulbar affect Cranial Nerves: CNII: No RAPD. Visual fields intact. CNIII, IV, VI: PERRL. No nystagmus. EOMI. CN V: Facial sensation intact bilaterally to fine touch. CN VII: Facial muscles symmetric and strong. No ptosis at rest. CN VIII: Hears finger rub well bilaterally. CN IX: No hypophonia. CN X: Palate elevates  symmetrically. CN XI: Full strength shoulder shrug bilaterally. CN XII: Tongue protrusion full and midline. No atrophy or fasciculations. No significant dysarthria Motor: Tone is mildly increased in all extremities, most consistent with paratonia. Tremor in right hand at rest, worsens with movement. Mild tremor in left hand with movement.  Individual muscle group testing (MRC grade out of 5):  Movement     Neck flexion 5    Neck extension 5     Right Left   Shoulder abduction 5 5-   Elbow flexion 5 5   Elbow extension 5 5-   Wrist extension 5 5   Wrist flexion 5 5   Finger abduction - FDI 5 5   Finger abduction - ADM 5 5   Finger extension 5- 5-   Finger distal flexion - 2/3 5 5    Finger distal flexion - 4/5 5 5    Thumb flexion - FPL 5 5   Thumb abduction - APB 5 5    Hip flexion 5 5-   Hip extension 5 5   Hip adduction 5 5   Hip abduction 5 5   Knee extension 5 5   Knee flexion 5 5   Dorsiflexion 5 5   Plantarflexion 5 5     Reflexes:  Right Left   Bicep 1+ 1+   Tricep 1+ 1+   BrRad 1+ 1+   Knee 2+ 2+   Ankle 0 0    Pathological Reflexes: Babinski: mute response bilaterally Hoffman: absent bilaterally Troemner: absent bilaterally Sensation: Pinprick: Intact in upper extremities. Patchy in bilateral lower extremities with no clear distribution of sensation loss (some in right leg, some in left) Vibration: Intact in all extremities Coordination: Intact finger-to- nose-finger bilaterally. Tremor in bilateral hands worse with movement, right >> left. Gait: Walks with cane. Stooped. No freezing, no obvious en bloc turns. Narrow based.  Labs and Imaging review: Internal labs: Lab Results  Component Value Date   HGBA1C (H) 12/06/2010    5.8 (NOTE)                                                                       According to the ADA Clinical Practice Recommendations for 2011, when HbA1c is used as a screening test:   >=6.5%   Diagnostic of Diabetes Mellitus            (if abnormal result  is confirmed)  5.7-6.4%   Increased risk of developing Diabetes Mellitus  References:Diagnosis and Classification of Diabetes Mellitus,Diabetes Care,2011,34(Suppl 1):S62-S69 and Standards of Medical Care in         Diabetes - 2011,Diabetes Care,2011,34  (Suppl 1):S11-S61.   No results found for: "VITAMINB12" Lab Results  Component Value Date   TSH 4.040 11/28/2022   Lab Results  Component Value Date   ESRSEDRATE 30 (H) 01/29/2023   External labs: 02/18/23: CMP significant for Cr 1.45 Lipid panel: HDL 40, LDL 63, TG 101 CBC unremarkable HbA1c: 6.2  Imaging: External CT head wo contrast (02/18/23):   CT thoracic spine wo contrast (01/26/23): FINDINGS: Alignment: Normal.   Vertebrae: Diffuse idiopathic skeletal hyperostosis. Interval sclerosis of the now subacute fracture through the T9 vertebral body. No extension into the posterior elements. No retropulsion or significant height loss. No evidence of epidural hematoma.   Paraspinal and other soft tissues: Persistent paraspinal edema about the T9 level. Unchanged small left pleural effusion with adjacent atelectasis in the left lung base.   Disc levels: No disc herniation, spinal canal stenosis or neural foraminal narrowing in the thoracic spine.   IMPRESSION: 1. Interval sclerosis of the now subacute fracture through the T9 vertebral body. No extension into the posterior elements. No retropulsion or significant height loss. 2. Unchanged small left pleural effusion with adjacent atelectasis in the left lung base.  CT head and cervical spine wo contrast (12/01/22): FINDINGS: CT HEAD FINDINGS   Brain: No evidence of acute infarction, hemorrhage, hydrocephalus, extra-axial collection or mass lesion/mass effect.   Vascular: No hyperdense vessel or unexpected calcification.   Skull: Normal. Negative for fracture or focal lesion.   Sinuses/Orbits: No middle ear or mastoid effusion. Paranasal  sinuses are clear. Orbits are unremarkable.   Other: None.   CT CERVICAL SPINE FINDINGS   Alignment: Straightening of the normal cervical lordosis.   Skull base and vertebrae: No acute fracture. No primary bone lesion or focal pathologic process.   Soft tissues and spinal canal: Redemonstrated left common carotid artery stent with narrowing of the stent lumen in the midportion, unchanged from prior exam.   Disc levels:  No evidence of high-grade spinal canal stenosis.   Upper chest:  Biapical pleural-parenchymal scarring.   Other: None   IMPRESSION: 1. No acute intracranial abnormality. 2. No acute cervical spine fracture or traumatic listhesis. 3. Redemonstrated left common carotid artery stent with narrowing of the stent lumen in the midportion, unchanged from prior exam.  MRI lumbar spine wo contrast (10/18/17): FINDINGS: Segmentation:  Normal   Alignment:  Unchanged grade 1 retrolisthesis at L3-L4 and L4-L5.   Vertebrae:  No fracture, evidence of discitis, or bone lesion.   Conus medullaris and cauda  equina: Conus extends to the L2 level. Conus and cauda equina appear normal.   Paraspinal and other soft tissues: 3.3 cm abdominal aortic aneurysm.   Disc levels:   T11-T12: Minimal disc bulge with no stenosis.   T12-L1: Normal disc space and facets. No spinal canal or neuroforaminal stenosis.   L1-L2: Progression of small disc bulge without spinal canal or neural foraminal stenosis.   L2-L3: Disc space narrowing with small bulge.  No stenosis.   L3-L4: Disc space narrowing with central disc protrusion, unchanged. Mild facet hypertrophy. No spinal canal stenosis. Mild bilateral foraminal stenosis, unchanged.   L4-L5: Left eccentric disc bulge and small central protrusion. No central spinal canal stenosis. Moderate left foraminal stenosis, unchanged from prior.   L5-S1: Small central disc protrusion without central spinal canal stenosis. Severe right neural  foraminal stenosis, unchanged. An extraforaminal component of the disc is in close proximity to the exiting right L5 nerve root in the extraforaminal zone. Left-greater-than-right facet hypertrophy.   Visualized sacrum: Normal.   IMPRESSION: 1. Unchanged appearance of the lumbar spine with severe right L5-S1 neural foraminal stenosis and moderate left L4-5 foraminal stenosis. 2. No central spinal canal stenosis.  No acute findings. 3. 3.3 cm infrarenal abdominal aortic aneurysm. Recommend followup by ultrasound in 3 years. This recommendation follows ACR consensus guidelines: White Paper of the ACR Incidental Findings Committee II on Vascular Findings. Earlyne Iba Radiol 2013; 16:109-604  Echo (10/30/22): 1. Left ventricular ejection fraction, by estimation, is 45 to 50%. Left  ventricular ejection fraction by PLAX is 49 %. The left ventricle has  mildly decreased function. The left ventricle demonstrates regional wall  motion abnormalities (see scoring  diagram/findings for description). Left ventricular diastolic parameters  are consistent with Grade I diastolic dysfunction (impaired relaxation).  There is moderate hypokinesis of the left ventricular, basal-mid inferior  wall and inferolateral wall.   2. Right ventricular systolic function is low normal. The right  ventricular size is mildly enlarged. There is normal pulmonary artery  systolic pressure. The estimated right ventricular systolic pressure is  34.4 mmHg.   3. The mitral valve is abnormal. Trivial mitral valve regurgitation.  Moderate to severe mitral annular calcification.   4. The aortic valve is tricuspid. There is moderate calcification of the  aortic valve. Aortic valve regurgitation is trivial. Moderate aortic valve  stenosis. Aortic valve area, by VTI measures 1.13 cm. Aortic valve mean  gradient measures 11.0 mmHg.  Aortic valve Vmax measures 2.18 m/s. Peak gradient 19 mmHg, DI 0.40. LVOT  diameter 1.9 cm.   5.  Aortic dilatation noted. There is borderline dilatation of the aortic  root, measuring 38 mm. There is mild dilatation of the ascending aorta,  measuring 43 mm.   6. The inferior vena cava is dilated in size with <50% respiratory  variability, suggesting right atrial pressure of 15 mmHg.   Comparison(s): Changes from prior study are noted. 02/23/2022: LVEF 40-45%,  mild to moderate AS.   Assessment/Plan:  Eric Gordon is a 89 y.o. male who presents for evaluation of left sided weakness, left leg numbness, and worsening tremor. He has a relevant medical history of afib (on eliquis), CAD s/p CABG, carotid stenosis s/p b/l stent, HTN, HLD, CHF, hypothyroidism, essential tremor. His neurological examination is pertinent for possible weakness of left side, mostly in proximal muscles. There is no clear distribution of sensory loss in lower extremities as findings were patchy in both the right and left lower extremities. Available diagnostic data is  significant for CT head that was normal (1 month after onset of left leg numbness). MRI lumbar spine from 2019 showed lumbar stenosis.   This is a complex case given patient's multiple co-morbidities. Stroke is a possibility, though not clear. He certainly has multiple risk factors for stroke, including afib, heart failure, HLD, HTN, and carotid stenosis. He has not missed any doses of eliquis. If symptoms of equivocal left sided weakness were dismissed, the left leg numbness might be explained by lumbosacral radiculopathy, given known spine disease. Without clear evidence of stroke, I would keep medications as is and recommend PT, which patient is already getting. If there were a clear stroke, then a discussion about possible eliquis failure and having to switch may occur with the help of cardiology.  In terms of tremor, symptoms do appear consistent with essential tremor. Patient is currently on metoprolol, but tremor is worsening. PCP prescribed primidone,  but patient is reluctant to try it prior to talking to cardiology. I explained that he could increase metoprolol, though this may not be the best idea as he reports bradycardia, or start the primidone. He will discuss with cardiology.  PLAN: -Blood work: B12 -CT head wo contrast -CTA head and neck -Continue eliquis 5 mg BID -Continue atorvastatin 80 mg daily -Continue physical therapy  -Patient and family want to discuss medications for ET with cardiology - options include starting Primidone 25 mg qhs or increased metoprolol dosage   -Return to clinic in 3 months  The impression above as well as the plan as outlined below were extensively discussed with the patient (in the company of wife and son) who voiced understanding. All questions were answered to their satisfaction.  The patient was counseled on pertinent fall precautions per the printed material provided today, and as noted under the "Patient Instructions" section below.  When available, results of the above investigations and possible further recommendations will be communicated to the patient via telephone/MyChart. Patient to call office if not contacted after expected testing turnaround time.   Total time spent reviewing records, interview, history/exam, documentation, and coordination of care on day of encounter:  90 min   Thank you for allowing me to participate in patient's care.  If I can answer any additional questions, I would be pleased to do so.  Jacquelyne Balint, MD   CC: Creola Corn, MD [address] [place] [address]  CC: Referring provider: Tenna Delaine, MD [address] [place],  [place] [address]

## 2023-02-26 DIAGNOSIS — Z6825 Body mass index (BMI) 25.0-25.9, adult: Secondary | ICD-10-CM | POA: Diagnosis not present

## 2023-02-26 DIAGNOSIS — S22078A Other fracture of T9-T10 vertebra, initial encounter for closed fracture: Secondary | ICD-10-CM | POA: Diagnosis not present

## 2023-03-05 ENCOUNTER — Ambulatory Visit: Payer: No Typology Code available for payment source | Admitting: Neurology

## 2023-03-05 ENCOUNTER — Other Ambulatory Visit (INDEPENDENT_AMBULATORY_CARE_PROVIDER_SITE_OTHER): Payer: Medicare Other

## 2023-03-05 ENCOUNTER — Encounter: Payer: Self-pay | Admitting: Neurology

## 2023-03-05 VITALS — BP 135/65 | HR 65 | Ht 75.0 in | Wt 200.0 lb

## 2023-03-05 DIAGNOSIS — I6523 Occlusion and stenosis of bilateral carotid arteries: Secondary | ICD-10-CM

## 2023-03-05 DIAGNOSIS — G250 Essential tremor: Secondary | ICD-10-CM | POA: Diagnosis not present

## 2023-03-05 DIAGNOSIS — I5043 Acute on chronic combined systolic (congestive) and diastolic (congestive) heart failure: Secondary | ICD-10-CM

## 2023-03-05 DIAGNOSIS — R200 Anesthesia of skin: Secondary | ICD-10-CM

## 2023-03-05 DIAGNOSIS — R531 Weakness: Secondary | ICD-10-CM

## 2023-03-05 DIAGNOSIS — I6521 Occlusion and stenosis of right carotid artery: Secondary | ICD-10-CM | POA: Diagnosis not present

## 2023-03-05 DIAGNOSIS — I2510 Atherosclerotic heart disease of native coronary artery without angina pectoris: Secondary | ICD-10-CM | POA: Diagnosis not present

## 2023-03-05 DIAGNOSIS — I4891 Unspecified atrial fibrillation: Secondary | ICD-10-CM

## 2023-03-05 LAB — VITAMIN B12: Vitamin B-12: 901 pg/mL (ref 211–911)

## 2023-03-05 NOTE — Patient Instructions (Addendum)
I would like to investigate your symptoms further with the following: -Blood work today Your provider has requested that you have labwork completed today. The lab is located on the Second floor at [address], within the [facility] office. When you get off the elevator, turn right and go in the [facility] [address]; the first brown door on the left.  Tell the ladies behind the desk that you are there for lab work. If you are not called within 15 minutes please check with the front desk.   Once you complete your labs you are free to go. You will receive a call or message via MyChart with your lab results.    -Repeat CT of your head -CT of your blood vessels of neck and head (CTA)  I will be in touch when I have your results.  Continue eliquis 5 mg twice daily. Continue atorvastatin 80 mg daily.  Continue physical therapy.  Discuss with cardiologist medications for tremor. You could increase metoprolol or start the primidone given by your primary doctor.  I would like to see you back in clinic in about 3 months or sooner if needed.  Please let me know if you have any questions or concerns in the meantime.   The physicians and staff at [facility] are committed to providing excellent care. You may receive a survey requesting feedback about your experience at our office. We strive to receive "very good" responses to the survey questions. If you feel that your experience would prevent you from giving the office a "very good " response, please contact our office to try to remedy the situation. We may be reached at [phone]. Thank you for taking the time out of your busy day to complete the survey.  Jacquelyne Balint, MD [facility]  Preventing Falls at [facility] are common, often dreaded events in the lives of older people. Aside from the obvious injuries and even death that may result, fall can cause wide-ranging consequences including loss of independence,  mental decline, decreased activity and mobility. Younger people are also at risk of falling, especially those with chronic illnesses and fatigue.  Ways to reduce risk for falling Examine diet and medications. Warm foods and alcohol dilate blood vessels, which can lead to dizziness when standing. Sleep aids, antidepressants and pain medications can also increase the likelihood of a fall.  Get a vision exam. Poor vision, cataracts and glaucoma increase the chances of falling.  Check foot gear. Shoes should fit snugly and have a sturdy, nonskid sole and a broad, low heel  Participate in a physician-approved exercise program to build and maintain muscle strength and improve balance and coordination. Programs that use ankle weights or stretch bands are excellent for muscle-strengthening. Water aerobics programs and low-impact Tai Chi programs have also been shown to improve balance and coordination.  Increase vitamin D intake. Vitamin D improves muscle strength and increases the amount of calcium the body is able to absorb and deposit in bones.  How to prevent falls from common hazards Floors - Remove all loose wires, cords, and throw rugs. Minimize clutter. Make sure rugs are anchored and smooth. Keep furniture in its usual place.  Chairs -- Use chairs with straight backs, armrests and firm seats. Add firm cushions to existing pieces to add height.  Bathroom - Install grab bars and non-skid tape in the tub or shower. Use a bathtub transfer bench or a shower chair with a back support Use an elevated toilet seat  and/or safety rails to assist standing from a low surface. Do not use towel racks or bathroom tissue holders to help you stand.  Lighting - Make sure halls, stairways, and entrances are well-lit. Install a night light in your bathroom or hallway. Make sure there is a light switch at the top and bottom of the staircase. Turn lights on if you get up in the middle of the night. Make sure lamps or  light switches are within reach of the bed if you have to get up during the night.  Kitchen - Install non-skid rubber mats near the sink and stove. Clean spills immediately. Store frequently used utensils, pots, pans between waist and eye level. This helps prevent reaching and bending. Sit when getting things out of lower cupboards.  Living room/ Bedrooms - Place furniture with wide spaces in between, giving enough room to move around. Establish a route through the living room that gives you something to hold onto as you walk.  Stairs - Make sure treads, rails, and rugs are secure. Install a rail on both sides of the stairs. If stairs are a threat, it might be helpful to arrange most of your activities on the lower level to reduce the number of times you must climb the stairs.  Entrances and doorways - Install metal handles on the walls adjacent to the doorknobs of all doors to make it more secure as you travel through the doorway.  Tips for maintaining balance Keep at least one hand free at all times. Try using a backpack or fanny pack to hold things rather than carrying them in your hands. Never carry objects in both hands when walking as this interferes with keeping your balance.  Attempt to swing both arms from front to back while walking. This might require a conscious effort if Parkinson's disease has diminished your movement. It will, however, help you to maintain balance and posture, and reduce fatigue.  Consciously lift your feet off of the ground when walking. Shuffling and dragging of the feet is a common culprit in losing your balance.  When trying to navigate turns, use a "U" technique of facing forward and making a wide turn, rather than pivoting sharply.  Try to stand with your feet shoulder-length apart. When your feet are close together for any length of time, you increase your risk of losing your balance and falling.  Do one thing at a time. Don't try to walk and accomplish  another task, such as reading or looking around. The decrease in your automatic reflexes complicates motor function, so the less distraction, the better.  Do not wear rubber or gripping soled shoes, they might "catch" on the floor and cause tripping.  Move slowly when changing positions. Use deliberate, concentrated movements and, if needed, use a grab bar or walking aid. Count 15 seconds between each movement. For example, when rising from a seated position, wait 15 seconds after standing to begin walking.  If balance is a continuous problem, you might want to consider a walking aid such as a cane, walking stick, or walker. Once you've mastered walking with help, you might be ready to try it on your own again.

## 2023-03-19 ENCOUNTER — Ambulatory Visit (HOSPITAL_COMMUNITY)
Admission: RE | Admit: 2023-03-19 | Discharge: 2023-03-19 | Disposition: A | Discharge status: Home / Self Care | Payer: Medicare Other | Source: Ambulatory Visit | Attending: Cardiology | Admitting: Cardiology

## 2023-03-19 ENCOUNTER — Other Ambulatory Visit: Payer: Self-pay

## 2023-03-19 ENCOUNTER — Encounter (HOSPITAL_COMMUNITY): Payer: Self-pay | Admitting: Cardiology

## 2023-03-19 VITALS — BP 110/70 | HR 68 | Wt 203.0 lb

## 2023-03-19 DIAGNOSIS — J90 Pleural effusion, not elsewhere classified: Secondary | ICD-10-CM | POA: Insufficient documentation

## 2023-03-19 DIAGNOSIS — Z7901 Long term (current) use of anticoagulants: Secondary | ICD-10-CM | POA: Insufficient documentation

## 2023-03-19 DIAGNOSIS — I480 Paroxysmal atrial fibrillation: Secondary | ICD-10-CM | POA: Insufficient documentation

## 2023-03-19 DIAGNOSIS — I253 Aneurysm of heart: Secondary | ICD-10-CM | POA: Diagnosis not present

## 2023-03-19 DIAGNOSIS — I452 Bifascicular block: Secondary | ICD-10-CM | POA: Insufficient documentation

## 2023-03-19 DIAGNOSIS — I5022 Chronic systolic (congestive) heart failure: Secondary | ICD-10-CM

## 2023-03-19 DIAGNOSIS — I714 Abdominal aortic aneurysm, without rupture, unspecified: Secondary | ICD-10-CM | POA: Insufficient documentation

## 2023-03-19 DIAGNOSIS — I2510 Atherosclerotic heart disease of native coronary artery without angina pectoris: Secondary | ICD-10-CM | POA: Diagnosis not present

## 2023-03-19 DIAGNOSIS — I495 Sick sinus syndrome: Secondary | ICD-10-CM | POA: Diagnosis not present

## 2023-03-19 DIAGNOSIS — I252 Old myocardial infarction: Secondary | ICD-10-CM | POA: Insufficient documentation

## 2023-03-19 DIAGNOSIS — I350 Nonrheumatic aortic (valve) stenosis: Secondary | ICD-10-CM | POA: Diagnosis not present

## 2023-03-19 DIAGNOSIS — I951 Orthostatic hypotension: Secondary | ICD-10-CM | POA: Insufficient documentation

## 2023-03-19 DIAGNOSIS — I6523 Occlusion and stenosis of bilateral carotid arteries: Secondary | ICD-10-CM

## 2023-03-19 DIAGNOSIS — E785 Hyperlipidemia, unspecified: Secondary | ICD-10-CM | POA: Insufficient documentation

## 2023-03-19 DIAGNOSIS — Z79899 Other long term (current) drug therapy: Secondary | ICD-10-CM | POA: Insufficient documentation

## 2023-03-19 DIAGNOSIS — I25810 Atherosclerosis of coronary artery bypass graft(s) without angina pectoris: Secondary | ICD-10-CM | POA: Insufficient documentation

## 2023-03-19 DIAGNOSIS — N183 Chronic kidney disease, stage 3 unspecified: Secondary | ICD-10-CM | POA: Diagnosis not present

## 2023-03-19 DIAGNOSIS — R531 Weakness: Secondary | ICD-10-CM

## 2023-03-19 DIAGNOSIS — R0600 Dyspnea, unspecified: Secondary | ICD-10-CM | POA: Insufficient documentation

## 2023-03-19 DIAGNOSIS — G250 Essential tremor: Secondary | ICD-10-CM

## 2023-03-19 DIAGNOSIS — R200 Anesthesia of skin: Secondary | ICD-10-CM

## 2023-03-19 LAB — BASIC METABOLIC PANEL
Anion gap: 9 (ref 5–15)
BUN: 32 mg/dL — ABNORMAL HIGH (ref 8–23)
CO2: 26 mmol/L (ref 22–32)
Calcium: 9.9 mg/dL (ref 8.9–10.3)
Chloride: 101 mmol/L (ref 98–111)
Creatinine, Ser: 1.67 mg/dL — ABNORMAL HIGH (ref 0.61–1.24)
GFR, Estimated: 39 mL/min — ABNORMAL LOW (ref >60)
Glucose, Bld: 97 mg/dL (ref 70–99)
Potassium: 5 mmol/L (ref 3.5–5.1)
Sodium: 136 mmol/L (ref 135–145)

## 2023-03-19 LAB — CBC
HCT: 45.6 % (ref 39.0–52.0)
Hemoglobin: 14.7 g/dL (ref 13.0–17.0)
MCH: 31.3 pg (ref 26.0–34.0)
MCHC: 32.2 g/dL (ref 30.0–36.0)
MCV: 97 fL (ref 80.0–100.0)
Platelets: 185 10*3/uL (ref 150–400)
RBC: 4.7 MIL/uL (ref 4.22–5.81)
RDW: 14.9 % (ref 11.5–15.5)
WBC: 9.2 10*3/uL (ref 4.0–10.5)
nRBC: 0 % (ref 0.0–0.2)

## 2023-03-19 LAB — LIPID PANEL
Cholesterol: 134 mg/dL (ref 0–200)
HDL: 44 mg/dL (ref >40)
LDL Cholesterol: 73 mg/dL (ref 0–99)
Total CHOL/HDL Ratio: 3 RATIO
Triglycerides: 84 mg/dL (ref <150)
VLDL: 17 mg/dL (ref 0–40)

## 2023-03-19 LAB — BRAIN NATRIURETIC PEPTIDE: B Natriuretic Peptide: 229.5 pg/mL — ABNORMAL HIGH (ref 0.0–100.0)

## 2023-03-19 NOTE — Progress Notes (Signed)
Advanced Heart Failure Clinic Note  Date:  03/19/2023   ID:  Eric Gordon, DOB 26-Feb-1931, MRN [id number]   Provider location: [place] [facility] Type of Visit: Established patient  PCP:  Creola Corn, MD  EP: Dr. Lalla Brothers HF Cardiologist:  Dr. Shirlee Latch   HPI: Eric Gordon is a 89 y.o. male with history of CAD s/p CABG.  He has had trouble long-term with exertional dyspnea.  Dyspnea triggered evaluation in 2012 leading to CABG but was not resolved by CABG.  PFTs in 3/14 showed only mild obstructive airways disease and V/Q scan showed no PE.  Echo in 9/14 showed normal LV systolic function with moderately dilated RV. RHC in 2/15 showed normal right and left heart filling pressures and normal PA pressure.  Finally, he had a CPX in 3/15 that showed normal capacity compared to age-matched sedentary norms.  He was noted to have chronotropic incompetence.  At a prior appointment, I took him off metoprolol given chronotropic incompetence noted on CPX.  Dyspnea improved significantly with weight loss.  He had Cardiolite in 8/18 with EF 52%, no ischemia or infarction.    He was admitted in 1/20 with NSTEMI.  LHC showed new occlusion of the branch of SVG-ramus and OM that touched down on OM.  There were also serial 70% stenoses in the mid/distal RCA.  There was not thought to be an interventional option and patient was treated medically. He was discharged home but presented a couple days later with recurrent chest pain and dyspnea.  Cath was repeated showing no change from prior.  This admission, he had FFR of the RCA which did not suggest hemodynamic significance.  Echo showed EF 55-60%, normal RV.  CTA did not show a PE. He was noted to be volume overloaded and was diuresed.  He was also noted to be in atrial fibrillation with RVR transiently.  ASA/Plavix was stopped and Eliquis was started. He was discharged to SNF.  Atrial fibrillation recurred and he was started on amiodarone  with conversion back to NSR.   In 4/20, he was admitted with left spontaneous PTX requiring chest tube.  After this, he developed recurrent left pleural effusions requiring thoracentesis x 3 so far.  Pleural fluid was exudative with eosinophil predominance.  With elevated ESR, there was concern that amiodarone could be involved, so this medication was stopped.  CT chest in 6/20 showed LLL consolidation/collapse with small left effusion, there was a rim-enhancing lesion posterior to the heart between esophagus and left atrium, ?loculated fluid.  TEE was done in 7/20 and confirmed loculated pleural effusion behind the left atrium.  In 7/20, patient had VATS on the left.   Carotid dopplers in 9/21 showed 80-99% RICA stenosis, 60-79% LICA.  AAA [place] in 9/21 showed 4.5 cm AAA. Patient was referred to Dr. Myra Gianotti for evaluation => he has had TCAR on right in 12/21.  He had left TCAR in 5/22.   Cardiolite in 12/21 with EF 41%, no ischemia, fixed inferolateral defect. Echo in 1/22 showed EF 40-45%, basal to mid inferolateral AK, basal inferior HK, normal RV, mild aortic stenosis mean gradient 11 mmHg.  He was unable to tolerate dapagliflozin and is off it. He was lightheaded on low dose Entresto so this was stopped.   Zio monitor in 1/23 showed 25% atrial flutter.  He was referred to EP, by the time he saw EP, burden of atrial arrhythmias was lower.   Cardiolite in 3/23 showed prior inferolateral  and inferior infarct, no ischemia.   Patient was admitted in 4/23 with atypical chest pain.  This likely was due to cough/bronchitis.  CTA chest showed inferolateral wall pseudoaneurysm, this is chronic.  Echo in 4/23 showed EF 40-45%, akinesis of the inferolateral wall, basal inferolateral pseudoaneurysm, moderate RV enlargement, normal RV systolic function, mild AS with mean gradient 10 mmHg.   Zio monitor in 5/23 showed 2% AF burden, improved from prior.     Follow up 5/23, stable NYHA II, euvolemic. Off several  meds due to orthostatic symptoms.  Admitted 6/23 with syncope, no evidence for ACS. 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 EP study with inducible VT. Underwent dual chamber Biotronik ICD placement. Discharged home, weight 208 lbs.  VT again in 9/23, treated with ATP.   Follow up 10/23, NYHA III and volume stable. GDMT limited by orthostatic symptoms and need for midodrine. Mexiletine started for recurrent VT.  S/p aortobi-iliac endograft repair of infrarenal abdominal aortic aneurysm 12/23. Type 2 endoleak noted on CTA in 1/24.   Patient was admitted in 1/24 with weakness, dyspnea, cough, atypical chest pain.  CTA chest showed not PE.  He ended up having cardiac cath, this showed no significant change to his coronary anatomy from the past.  Filling pressures were not elevated on RHC.  He was discharged home.   Echo in 2/24 showed EF 45-50%, basal-mid inferior/inferolateral akinesis, low normal RV function, moderate AS with mean gradient 11 and AVA 1.13 cm^2.   Admitted 3/24 with syncopal events, one event resulting in trauma to head and back. One event coincide with VT. Neuro consulted, suggested conservative non-op treatment, back brace for 6-8 weeks. Discharged on amiodarone taper and off mexiletine.  Patient again tolerated amiodarone poorly with increased dyspnea (happened last time he was on amiodarone).  This was then stopped and mexiletine was started.   Today he returns for followup of CHF and CAD.  He is breathing much better off amiodarone.  No palpitations.  No lightheadedness or falls. He says he can walk up to 1/3 mile before getting fatigued or short of breath.  No chest pain. No orthopnea/PND. Weight down 2 lbs.   Biotronik device unable to be interrogated.   ECG (personally reviewed): a-paced, RBBB, LAFB   Labs (8/18): LDL 82, HDL 41, TSH normal, hgb 16.3, K 4.1, creatinine 1.05 Labs (2/20): LFTs normal, pro BNP 842 Labs (3/20):  K 3.8, creatinine 1.48, TSH mildly elevated at 6.97, free T3 and free T4 normal Labs (4/20): K 3.9, creatinine 1.36 Labs (2/20): LDL 52 Labs (8/20): BNP 241, creatinine 1.21  Labs (9/20): BNP 244, K 4, creatinine 1.12 Labs (2/21): K 4.3, creatinine 1.26, BNP 271 Labs (6/21): K 4.2, creatinine 1.11, LDL 57 Labs (6/22): K 4.3, creatinine 1.16, LDL 49 Labs (8/22): K 4.3, creatinine 1.37 Labs (12/22): K 3.9, creatinine 1.18 Labs (4/23): K 4.7, creatinine 1.35, BNP 253 Labs (6/23): K 4.1, creatinine 0.95 => 1.28 Labs (10/23): K 4.4, creatinine 1.26 Labs (11/23): K 4.3, creatinine 1.28 Labs (1/24): K 4.2, creatinine 1.1, BNP 202, HS-TnI negative  Labs (3/24): K 4.1, SCr 1.49 Labs (5/24): K 4.5, creatinine 1.89, urine immunofixation negative, myeloma panel negative   PMH: 1. Essential tremor 2. CAD: s/p CABG in 3/12.   - Cardiolite (8/18): EF 52%, no ischemia/infarction.  - NSTEMI (1/20):  LHC showed new occlusion of the branch of SVG-ramus and OM that touched down on OM.  There were also serial 70%  stenoses in the mid/distal RCA.  FFR of RCA was negative.  - Cardiolite (12/21): EF 41%, no ischemia, fixed inferolateral defect - Cardiolite (3/23): LV EF 37%, fixed basal-mid inferolateral/inferior defect, no ischemia.  - LHC (1/24): Totally occluded SVG-PDA, occlusion of the branch of sequential SVG-ramus and OM that touches down on OM but patent ramus branch, occluded SVG-D, patent LIMA-LAD. Similar to prior, no intervention.  3. Atrial fibrillation: Only noted post-op CABG.  4. PE: Post-op CABG in 2012.  5. AAA: 3.6 cm on [place] in 3/14.  3.6 cm on [place] in 3/15. 3.6 cm on [place] 8/16. 4.1 cm on [place] 4/17.  - AAA [place] (5/18): 4.1 cm AAA.  - AAA [place] (5/19): 4.1 cm AAA.  - AAA [place] (4/20): 4.1 cm AAA.  - AAA [place] (9/21): 4.5 cm AAA - AAA [place] (9/22): 4.5 cm AAA - AAA [place] (10/23): 4.9 cm AAA - s/p EVAR 12/23, type 2 endoleak noted on 1/24 CTA.  6. OSA: On CPAP.  7. Carotid stenosis: Carotid dopplers (9/14) with  60-79% LICA stenosis.  Carotid dopplers (9/15) with 60-79% LICA stenosis. Carotid dopplers (3/16) with 60-79% RCIA stenosis, 40-59% LICA stenosis.  - Carotid dopplers (8/16) with 40-59% RICA stenosis, 60-79% LICA stenosis.  - Carotid dopplers (8/17) with 60-79% RICA, 40-59% LICA. - Carotid dopplers (9/18) with 40-59% RICA, 60-79% LICA.   - Carotid dopplers (9/19) with 40-59% RICA, 60-79% LICA.  - Carotid dopplers (9/20) with 60-79% BICA - Carotid dopplers (9/21): 80-99% RICA, 60-79% LICA - Right TCAR 12/21, left TCAR 5/22.  - Carotid dopplers (4/23): patent ICA stents.  8. Asthma 9. PNA x 2 10. Chronic diastolic CHF/dyspnea: Echo (9/14) with EF 60-65%, mild LVH, very mild AS with mean gradient 10 mmHg, RV moderately dilated.  PFTs (3/14) showed mild obstructive airways disease.  V/Q scan (3/14) with no PE.  ENT workup for upper respiratory causes was negative.  RHC (2/15) with mean RA 7, PA 32/12, mean PCWP 13, CI 3.18.  CPX (3/15) with peak VO2 16.4, VE/VCO2 33; normal when compared to age-matched sedentary normals; chronotropic incompetence was noted.  Dyspnea was improved with weight loss.  - Echo (8/17): EF 60-65%, mild AS.  - Echo (1/20): EF 55-60%, normal RV size and systolic function.  - Echo (6/20): EF 55%, mild LVH, mild AS, normal RV size and systolic function, ?LA mass or mass impinging on posterior LA.  - TEE (7/20): EF 50-55%, mild LVH, basal inferolateral aneurysm, normal RV size/systolic function, loculated pleural fluid behind LA.  - Echo (1/22): EF 40-45%, basal to mid inferolateral AK, basal inferior HK, normal RV, mild aortic stenosis mean gradient 11 mmHg. - Echo (2/23): EF 45-50%, moderate LVH, normal RV, mild AS mean gradient 15 mmHg with IVC normal.  - Echo (4/23): EF 40-45%, akinesis of the inferolateral wall, basal inferolateral pseudoaneurysm, moderate RV enlargement, normal RV systolic function, mild AS with mean gradient 10 mmHg.  - Echo (6/23): EF 40-45% and  pre-existing aneurysm of the basal inferolateral wall, mild-mod AS. - RHC (1/24): mean RA 1, PA 19/10, mean PCWP 5, CI 2.66.  - Echo (2/24): EF 45-50%, basal-mid inferior/inferolateral akinesis, low normal RV function, moderate AS with mean gradient 11 and AVA 1.13 cm^2. - PYP scan (5/24): Grade 1, H/CL 1.2 => not suggestive of transthyretin cardiac amyloidosis.  11. HTN 12. Aortic stenosis: Moderate on 2/24 echo.   13. Ascending aortic aneurysm: CTA chest with 4.4 cm ascending aorta in 1/20.  - 4.2 cm ascending aorta  on CT chest 6/20.  14. Atrial fibrillation: Paroxysmal.  15. CKD: Stage 3.  16. Left lung spontaneous PTX then recurrent left pleural effusion.  - Left VATS in 7/20.  17. Palpitations:  - Zio patch (6/20): NSR with 1 short NSVT run and few short SVT runs, no atrial fibrillation.  - Zio patch 2 wks (2/21): 6 short NSVT runs, 66 short SVT runs, rare PACs/PVCs.  - Zio patch (5/23): 2% AF burden, 4 NSVT runs longest 5 beats.  18. COVID-19 infection 4/22.  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  20. VT: Syncope in 6/23 with inducible VT. s/p dual chamber Biotronik ICD - Dyspnea with amiodarone use.  21. Essential tremor  Current Outpatient Medications  Medication Sig Dispense Refill   apixaban (ELIQUIS) 5 MG TABS tablet Take 1 tablet (5 mg total) by mouth 2 (two) times daily. 60 tablet    atorvastatin (LIPITOR) 80 MG tablet Take 1 tablet (80 mg total) by mouth daily. 30 tablet 6   cetirizine (ZYRTEC) 10 MG tablet Take 10 mg by mouth daily.     Cholecalciferol (VITAMIN D-3) 25 MCG (1000 UT) CAPS Take 1,000 Units by mouth in the morning.     Cyanocobalamin (VITAMIN B 12 PO) Take 1 tablet by mouth daily.     finasteride (PROSCAR) 5 MG tablet Take 5 mg by mouth daily.     isosorbide mononitrate (IMDUR) 30 MG 24 hr tablet Take 0.5 tablets (15 mg total) by mouth daily.     levothyroxine (SYNTHROID) 50 MCG tablet Take 50 mcg by mouth at bedtime.     metoprolol succinate  (TOPROL XL) 25 MG 24 hr tablet Take 1 tablet (25 mg total) by mouth daily.     mexiletine (MEXITIL) 150 MG capsule Take 150 mg by mouth 2 (two) times daily.     midodrine (PROAMATINE) 2.5 MG tablet Take 1 tablet (2.5 mg total) by mouth 2 (two) times daily with a meal. 180 tablet 3   Multiple Vitamin (MULTIVITAMIN WITH MINERALS) TABS tablet Take 1 tablet by mouth in the morning.     pantoprazole (PROTONIX) 40 MG tablet Take 40 mg by mouth daily before breakfast.     spironolactone (ALDACTONE) 25 MG tablet Take 0.5 tablets (12.5 mg total) by mouth at bedtime. 15 tablet 2   No current facility-administered medications for this encounter.   Allergies:   Ancef [cefazolin sodium], Empagliflozin, and Vancomycin   Social History:  The patient  reports that he quit smoking about 39 years ago. His smoking use included cigarettes. He has a 35.00 pack-year smoking history. He has never used smokeless tobacco. He reports current alcohol use of about 2.0 standard drinks of alcohol per week. He reports that he does not use drugs.   Family History:  The patient's family history includes Coronary artery disease in his mother; Heart disease in his mother; Hypertension in his mother.   ROS:  Please see the history of present illness.   All other systems are personally reviewed and negative.   Recent Labs: 11/28/2022: TSH 4.040 12/01/2022: ALT 11 12/03/2022: Magnesium 1.9 03/19/2023: B Natriuretic Peptide 229.5; BUN 32; Creatinine, Ser 1.67; Hemoglobin 14.7; Platelets 185; Potassium 5.0; Sodium 136  Personally reviewed   BP 110/70   Pulse 68   Wt 92.1 kg (203 lb)   SpO2 95%   BMI 25.37 kg/m   Wt Readings from Last 3 Encounters:  03/19/23 92.1 kg (203 lb)  03/05/23 90.7 kg (200 lb)  02/02/23 93 kg (205 lb)  Physical exam: General: NAD Neck: No JVD, no thyromegaly or thyroid nodule.  Lungs: Decreased BS left base.  CV: Nondisplaced PMI.  Heart regular S1/S2, no S3/S4, 1/6 SEM RUSB.  No peripheral  edema.  No carotid bruit.  Normal pedal pulses.  Abdomen: Soft, nontender, no hepatosplenomegaly, no distention.  Skin: Intact without lesions or rashes.  Neurologic: Alert and oriented x 3. +Tremor Psych: Normal affect. Extremities: No clubbing or cyanosis.  HEENT: Normal.   Assessment & Plan: 1. Syncope: 6/23, most likely cardiogenic. Known tachy-brady syndrome with outpatient monitor showing post-conversion (AF => NSR) symptomatic pauses, up to 3 sec long. Has bifascicular block.  EP study in 6/23 w/ inducible VT. Biotronik ICD placed.  2. Chronic HF with mid range EF: TEE in 7/20 showed EF 50-55%, basal inferolateral aneurysm, and normal RV.  Echo in 1/22 with EF 40-45%, basal-mid inferolateral akinesis and inferior hypokinesis, normal RV.  Echo in 6/23 showed EF 40-45% and pre-existing aneurysm of the basal inferolateral wall, mild-mod AS.  RHC in 1/24 with normal filling pressures and preserved cardiac output.  Echo in 2/24 showed EF 45-50%, basal-mid inferior/inferolateral akinesis, low normal RV function, moderate AS with mean gradient 11 and AVA 1.13 cm^2.  Workup for cardiac amyloidosis was negative (PYP scan in 5/24 not suggestive of TTR cardiac amyloidosis).  NYHA class II-III symptoms, not volume overloaded on exam.  Dyspnea improved since stopping amiodarone.  GDMT has been very limited by orthostatic symptoms. Suspect deconditioning and lung disease (recurrent left effusion and h/o PTX with likely scarring) play a role in his dyspnea.  - Continue Toprol XL 25 mg daily.  - He is not taking Lasix and does not look volume overloaded.  - Continue spironolactone 12.5 mg qhs. - Continue low dose midodrine for now with orthostatic symptoms.  - Off ARB due to orthostatic hypotension. - Unable to tolerate dapaglifozin. 3. CAD: s/p CABG 3/12. NSTEMI 1/20, LHC showed new occlusion of the branch of sequential SVG-ramus and OM that touched down on OM.  There were also serial 70% stenoses in the  mid/distal RCA.  FFR of RCA was negative.  No interventional target.  Cardiolite in 12/21 showed inferolateral infarction with no ischemia, consistent with findings on NSTEMI in 1/20. Cardiolite in 3/23 showed inferior/inferolateral fixed defect with no ischemia.  LHC in 1/24 showed unchanged coronary anatomy with occluded branch of sequential SVG-ramus and OM touching down on OM, patent LIMA-LAD, occluded SVG-D, occluded SVG-PDA; no intervention.  He has not had chest pain since leaving the hospital in 1/24.  - No ASA given Eliquis use.  - Continue atorvastatin 80 daily.   - Continue Imdur 15 mg daily.  - Continue Toprol XL 25 mg daily.  4. Hyperlipidemia: Continue statin, LDL 53 2/24 5. Spontaneous left PTX with recurrent left-sided pleural effusion:  He is s/p left VATS/pleurodesis in 7/20.    6. Carotid stenosis: S/p bilateral TCARs, followed at [facility].     7. AAA: 4.9 cm AAA in 10/23.  S/p EVAR 12/23.  Has type 2 endoleak by CTA in 1/24.  - Follow at [facility].  8. Aortic stenosis: Moderate by echo in 2/24.   9. Atrial fibrillation: Paroxysmal. He is significantly symptomatic while in atrial fibrillation. He has gone back off amiodarone given pulmonary symptoms again while on it.  Not candidate for sotalol per EP.  NSR (a-paced) today.   - Continue Eliquis 5 mg bid. CBC today. 10. 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  This  has been stable.  11. VT: Recurrent, suspect scar in the inferolateral wall is nidus.  Has Biotronik ICD.  He had VT again in 9/23, not associated with chest pain/ischemic symptoms.  VT noted with syncopal episode 11/27/22. Has not tolerated amiodarone due to lung toxicity and mexiletine started.  - Continue mexiletine.  - Continue Toprol XL 25 mg daily.  12. CKD stage 3: BMET today.   Followup 3 months with APP   Marca Ancona  03/19/2023

## 2023-03-19 NOTE — Patient Instructions (Signed)
There has been no changes to your medications.  Labs done today, your results will be available in MyChart, we will contact you for abnormal readings.  Your physician recommends that you schedule a follow-up appointment in: 3 months  If you have any questions or concerns before your next appointment please send us a message through mychart or call our office at [phone].    TO LEAVE A MESSAGE FOR THE NURSE SELECT OPTION 2, PLEASE LEAVE A MESSAGE INCLUDING: YOUR NAME DATE OF BIRTH CALL BACK NUMBER REASON FOR CALL**this is important as we prioritize the call backs  YOU WILL RECEIVE A CALL BACK THE SAME DAY AS LONG AS YOU CALL BEFORE 4:00 PM  At the [facility], you and your health needs are our priority. As part of our continuing mission to provide you with exceptional heart care, we have created designated Provider Care Teams. These Care Teams include your primary Cardiologist (physician) and Advanced Practice Providers (APPs- Physician Assistants and Nurse Practitioners) who all work together to provide you with the care you need, when you need it.   You may see any of the following providers on your designated Care Team at your next follow up: Dr Daniel Bensimhon Dr Dalton McLean Dr. Aditya Sabharwal Amy Clegg, NP Brittainy Simmons, PA Jessica Milford,NP Lindsay Finch, PA Alma Diaz, NP Lauren Kemp, PharmD   Please be sure to bring in all your medications bottles to every appointment.    Thank you for choosing [facility]    

## 2023-03-23 ENCOUNTER — Ambulatory Visit
Admission: RE | Admit: 2023-03-23 | Discharge: 2023-03-23 | Disposition: A | Discharge status: Home / Self Care | Payer: Medicare Other | Source: Ambulatory Visit | Attending: Neurology | Admitting: Neurology

## 2023-03-23 ENCOUNTER — Other Ambulatory Visit: Payer: Self-pay | Admitting: Neurology

## 2023-03-23 DIAGNOSIS — I2510 Atherosclerotic heart disease of native coronary artery without angina pectoris: Secondary | ICD-10-CM

## 2023-03-23 DIAGNOSIS — R531 Weakness: Secondary | ICD-10-CM

## 2023-03-23 DIAGNOSIS — I4891 Unspecified atrial fibrillation: Secondary | ICD-10-CM

## 2023-03-23 DIAGNOSIS — G250 Essential tremor: Secondary | ICD-10-CM

## 2023-03-23 DIAGNOSIS — I6523 Occlusion and stenosis of bilateral carotid arteries: Secondary | ICD-10-CM

## 2023-03-23 DIAGNOSIS — R200 Anesthesia of skin: Secondary | ICD-10-CM

## 2023-03-23 DIAGNOSIS — I700 Atherosclerosis of aorta: Secondary | ICD-10-CM | POA: Diagnosis not present

## 2023-03-23 DIAGNOSIS — I6521 Occlusion and stenosis of right carotid artery: Secondary | ICD-10-CM

## 2023-03-23 DIAGNOSIS — I5043 Acute on chronic combined systolic (congestive) and diastolic (congestive) heart failure: Secondary | ICD-10-CM

## 2023-03-23 MED ORDER — IOPAMIDOL (ISOVUE-370) INJECTION 76%
60.0000 mL | Freq: Once | INTRAVENOUS | Status: AC | PRN
  Administered 2023-03-23: 60 mL via INTRAVENOUS

## 2023-03-24 NOTE — Progress Notes (Signed)
Remote ICD transmission.   

## 2023-03-30 ENCOUNTER — Ambulatory Visit: Payer: Medicare Other | Admitting: Surgery

## 2023-03-30 ENCOUNTER — Ambulatory Visit (HOSPITAL_COMMUNITY): Payer: Medicare Other

## 2023-03-30 ENCOUNTER — Telehealth: Payer: Self-pay | Admitting: Neurology

## 2023-03-30 NOTE — Telephone Encounter (Signed)
I called patient to discuss the results of his CT head and CTA head and neck. CT head showed no evidence of new infarct. CTA did show that the left carotid stent was partially collapsed with suggestion of endoleak resulting in 40% proximal in stent stenosis. This would not explain patient's left sided symptoms and patient has no right sided symptoms, so the significance of this stent collapse is unclear. Patient has a follow up with his vascular surgeon who did the stenting, Dr. Coral Else, on 05/11/23. Patient will discuss further at that time.  Overall, patient states he is feeling better currently.  All questions were answered.  Jacquelyne Balint, MD [facility]

## 2023-04-06 NOTE — Progress Notes (Unsigned)
Electrophysiology Office Note:   ID:  Eric Gordon, DOB 1931/01/21, MRN [id number]  Primary Cardiologist: None Electrophysiologist: Lanier Prude, MD  {Click to update primary MD,subspecialty MD or APP then REFRESH:1}    History of Present Illness:   Eric Gordon is a 89 y.o. male with h/o h/o chronic systolic HF, VT with Biotronik ICD, CAD s/p CABG, Chronic anticoagulation with eliquis  seen today for routine electrophysiology followup.   Admitted 3/11 - 3/14 with fall, syncope, and T9 vertebral fracture. He had VT treated by device on 3/7 associated with syncopal episode.  He has not further VT on ICD interrogation.  He is fall from 3/9 did not appear to be associated with VT.    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   Was doing fairly well in office visit 4/2 for f/u.    Seen in HF clinic 5/9 and complained of SOB and overall feeling terrible. Slight AKI noted. ESR mildly positive at 30 mm/hr. No thyroid or liver panels.    Lasix decreased.   Seen in clinic 5/13 and taken off amidoarone, as was felt to be contributing to his SOB and malaise.  Since last being seen in our clinic the patient reports doing ***.  he denies chest pain, palpitations, dyspnea, PND, orthopnea, nausea, vomiting, dizziness, syncope, edema, weight gain, or early satiety.   Review of systems complete and found to be negative unless listed in HPI.   EP Information / Studies Reviewed:    EKG is ordered today. Personal review as below.       ICD Interrogation-  reviewed in detail today,  See PACEART report.  Device History: Biotronik Dual Chamber ICD implanted 02/25/2022 for VT and ICM History of appropriate therapy: Yes History of AAD therapy: Previously taken off amiodarone with concerns for pulm tox (for AF) Resumed 11/2022 but again had symptoms of  severe malaise and SOB  Risk Assessment/Calculations:   {Does this patient have ATRIAL FIBRILLATION?:[id number]} No BP recorded.  {Refresh Note OR Click here to enter BP  :1}***        Physical Exam:   VS:  There were no vitals taken for this visit.   Wt Readings from Last 3 Encounters:  03/19/23 203 lb (92.1 kg)  03/05/23 200 lb (90.7 kg)  02/02/23 205 lb (93 kg)     GEN: Well nourished, well developed in no acute distress NECK: No JVD; No carotid bruits CARDIAC: {EPRHYTHM:28826}, no murmurs, rubs, gallops RESPIRATORY:  Clear to auscultation without rales, wheezing or rhonchi  ABDOMEN: Soft, non-tender, non-distended EXTREMITIES:  No edema; No deformity   ASSESSMENT AND PLAN:    Chronic systolic dysfunction s/p {INDUSTRY:28136} {Blank single:19197::"***","single chamber ICD","dual chamber ICD","CRT-D","S-ICD"}  euvolemic today Stable on an appropriate medical regimen Normal ICD function See Pace Art report No changes today  Syncope VT *** ICD interrogation.  Continue toprol 25 mg daily Intolerant to amiodarone due to profound malaise and worsening SOB.  Unfortunately, he has a quite advanced cardiomyopathy and few options for escalation of treatment which we discussed at length today.  Continue mexitil 150 mg BID  He understands he does not have other medication options for his VT.  He, wife, and son verbalize understanding that the risk of being off amiodarone is a potentially life threatening rhythm, and his symptoms may also be the expression of his late  stage cardiomyopathy.    HFrmEF ICM Echo 45-50% 10/2022 GDMT significantly limited by hypotension OK to titrate midodrine up as needed/tolerated. Lasix prn currently.    CAD Cath 09/2022 with 2 of 5 patent grafts Denies s/s ischemia   PAF Continue eliquis for CHA2DS2VASc of at least 4 Well controlled overall.     Orthostasis Has had several falls and near falls NOT associated with VT.  Continue midodrine  2.5 mg BID. Can titrate as needed.  Disposition:   Follow up with {EPPROVIDERS:28135} {EPFOLLOW UP:28173}   Signed, Eric Freer, PA-C

## 2023-04-07 ENCOUNTER — Encounter: Payer: Self-pay | Admitting: Student

## 2023-04-07 ENCOUNTER — Ambulatory Visit: Payer: Medicare Other | Attending: Student | Admitting: Student

## 2023-04-07 VITALS — BP 128/86 | HR 83 | Ht 75.0 in | Wt 204.2 lb

## 2023-04-07 DIAGNOSIS — I5022 Chronic systolic (congestive) heart failure: Secondary | ICD-10-CM | POA: Diagnosis not present

## 2023-04-07 DIAGNOSIS — I472 Ventricular tachycardia, unspecified: Secondary | ICD-10-CM | POA: Insufficient documentation

## 2023-04-07 DIAGNOSIS — I480 Paroxysmal atrial fibrillation: Secondary | ICD-10-CM | POA: Insufficient documentation

## 2023-04-07 DIAGNOSIS — I2510 Atherosclerotic heart disease of native coronary artery without angina pectoris: Secondary | ICD-10-CM | POA: Insufficient documentation

## 2023-04-07 DIAGNOSIS — R55 Syncope and collapse: Secondary | ICD-10-CM | POA: Diagnosis not present

## 2023-04-07 NOTE — Patient Instructions (Signed)
Medication Instructions:  Your physician recommends that you continue on your current medications as directed. Please refer to the Current Medication list given to you today.  *If you need a refill on your cardiac medications before your next appointment, please call your pharmacy*  Lab Work: None ordered If you have labs (blood work) drawn today and your tests are completely normal, you will receive your results only by: MyChart Message (if you have MyChart) OR A paper copy in the mail If you have any lab test that is abnormal or we need to change your treatment, we will call you to review the results.  Follow-Up: At [facility], you and your health needs are our priority.  As part of our continuing mission to provide you with exceptional heart care, we have created designated Provider Care Teams.  These Care Teams include your primary Cardiologist (physician) and Advanced Practice Providers (APPs -  Physician Assistants and Nurse Practitioners) who all work together to provide you with the care you need, when you need it.  Your next appointment:   6 month(s)  Provider:   Steffanie Dunn, MD or [facility]" [place], [place]

## 2023-05-11 ENCOUNTER — Encounter: Payer: Self-pay | Admitting: Surgery

## 2023-05-11 ENCOUNTER — Ambulatory Visit (HOSPITAL_COMMUNITY)
Admission: RE | Admit: 2023-05-11 | Discharge: 2023-05-11 | Disposition: A | Discharge status: Home / Self Care | Payer: Medicare Other | Source: Ambulatory Visit | Attending: Surgery | Admitting: Surgery

## 2023-05-11 ENCOUNTER — Ambulatory Visit (INDEPENDENT_AMBULATORY_CARE_PROVIDER_SITE_OTHER): Payer: Medicare Other | Admitting: Surgery

## 2023-05-11 VITALS — BP 148/80 | HR 62 | Temp 98.0°F | Resp 20 | Ht 75.0 in | Wt 207.0 lb

## 2023-05-11 DIAGNOSIS — I6523 Occlusion and stenosis of bilateral carotid arteries: Secondary | ICD-10-CM | POA: Diagnosis not present

## 2023-05-11 DIAGNOSIS — I7143 Infrarenal abdominal aortic aneurysm, without rupture: Secondary | ICD-10-CM

## 2023-05-11 DIAGNOSIS — I6529 Occlusion and stenosis of unspecified carotid artery: Secondary | ICD-10-CM

## 2023-05-11 NOTE — Progress Notes (Signed)
[facility] [place]  Patient name: Eric Gordon MRN: [id number] DOB: 12/31/1930 Sex: male   REASON FOR VISIT:    Follow up  HISOTRY OF PRESENT ILLNESS:    Eric Gordon is a 89 y.o. male who returns today for follow-up.  He is status post endovascular aneurysm repair on 08/06/2022.  He is status post right sided TCAR on 09/13/2020 for 90% stenosis. He then underwent left-sided TCAR on 02/08/2021 for 90% stenosis.  He has a family history of having 2 uncles died from ruptured aneurysms.  Patient has a history of coronary artery disease status post CABG.  He has had a NSTEMI from occluded bypass grafts.  He has atrial fibrillation.  He is anticoagulated on Eliquis.  He has had a spontaneous pneumothorax requiring chest tube decompression he gets dyspneic at 150 to 200 yards.  He is medically managed for hypertension.  He takes a statin for hypercholesterolemia.  He recently had a defibrillator placed for V. tach and NYHA class III heart failure  PAST MEDICAL HISTORY:   Past Medical History:  Diagnosis Date   Abdominal aortic aneurysm (AAA) (HCC)    Achilles tendinitis of right lower extremity    AICD (automatic cardioverter/defibrillator) present    Arthritis    "my whole body" (03/19/2018)   Atherosclerosis of coronary artery bypass graft w/o angina pectoris    Atrial fibrillation (HCC)    Barrett's esophagus    CAD (coronary artery disease)    Carotid artery disease (HCC)    CHF (congestive heart failure) (HCC)    Chronic anticoagulation    PE   Chronic lower back pain    Dyspnea    Dysrhythmia    Essential tremor    GERD (gastroesophageal reflux disease)    High cholesterol    Hyperlipidemia    Hyperplasia, prostate    Hypertension    Hypothyroidism    Myocardial infarction (HCC)    2 in Jan. 2019   Nail dystrophy    OSA on CPAP    uses CPAP   Osteoarthrosis    Pneumonia    "now and once before"  (03/19/2018)   Pulmonary embolism [facility])    April 2012 after CABG   S/P CABG (coronary artery bypass graft)    Sleep apnea    Spontaneous pneumothorax    left spontaneous pneumothorax/left pleural effusion, s/p chest tube 01/09/19; thoracentesis x2, s/p left VATS/tacl pleurodesis 04/12/19     FAMILY HISTORY:   Family History  Problem Relation Age of Onset   Coronary artery disease Mother    Hypertension Mother    Heart disease Mother     SOCIAL HISTORY:   Social History   Tobacco Use   Smoking status: Former    Current packs/day: 0.00    Average packs/day: 1 pack/day for 35.0 years (35.0 ttl pk-yrs)    Types: Cigarettes    Start date: 02/17/1949    Quit date: 02/18/1984    Years since quitting: 39.2   Smokeless tobacco: Never  Substance Use Topics   Alcohol use: Yes    Alcohol/week: 2.0 standard drinks of alcohol    Types: 2 Standard drinks or equivalent per week    Comment: Occ     ALLERGIES:   Allergies  Allergen Reactions   Ancef [Cefazolin Sodium] Hives   Empagliflozin Other (See Comments)    Dizziness   Vancomycin Rash     CURRENT MEDICATIONS:   Current Outpatient Medications  Medication Sig Dispense Refill  apixaban (ELIQUIS) 5 MG TABS tablet Take 1 tablet (5 mg total) by mouth 2 (two) times daily. 60 tablet    atorvastatin (LIPITOR) 80 MG tablet Take 1 tablet (80 mg total) by mouth daily. 30 tablet 6   cetirizine (ZYRTEC) 10 MG tablet Take 10 mg by mouth daily.     Cholecalciferol (VITAMIN D-3) 25 MCG (1000 UT) CAPS Take 1,000 Units by mouth in the morning.     Cyanocobalamin (VITAMIN B 12 PO) Take 1 tablet by mouth daily.     finasteride (PROSCAR) 5 MG tablet Take 5 mg by mouth daily.     isosorbide mononitrate (IMDUR) 30 MG 24 hr tablet Take 0.5 tablets (15 mg total) by mouth daily.     levothyroxine (SYNTHROID) 50 MCG tablet Take 50 mcg by mouth at bedtime.     metoprolol succinate (TOPROL XL) 25 MG 24 hr tablet Take 1 tablet (25 mg total) by mouth  daily.     mexiletine (MEXITIL) 150 MG capsule Take 150 mg by mouth 2 (two) times daily.     midodrine (PROAMATINE) 2.5 MG tablet Take 1 tablet (2.5 mg total) by mouth 2 (two) times daily with a meal. 180 tablet 3   Multiple Vitamin (MULTIVITAMIN WITH MINERALS) TABS tablet Take 1 tablet by mouth in the morning.     pantoprazole (PROTONIX) 40 MG tablet Take 40 mg by mouth daily before breakfast.     spironolactone (ALDACTONE) 25 MG tablet Take 0.5 tablets (12.5 mg total) by mouth at bedtime. 15 tablet 2   No current facility-administered medications for this visit.    REVIEW OF SYSTEMS:   [X]  denotes positive finding, [ ]  denotes negative finding Cardiac  Comments:  Chest pain or chest pressure:    Shortness of breath upon exertion:    Short of breath when lying flat:    Irregular heart rhythm:        Vascular    Pain in calf, thigh, or hip brought on by ambulation:    Pain in feet at night that wakes you up from your sleep:     Blood clot in your veins:    Leg swelling:         Pulmonary    Oxygen at home:    Productive cough:     Wheezing:         Neurologic    Sudden weakness in arms or legs:     Sudden numbness in arms or legs:     Sudden onset of difficulty speaking or slurred speech:    Temporary loss of vision in one eye:     Problems with dizziness:         Gastrointestinal    Blood in stool:     Vomited blood:         Genitourinary    Burning when urinating:     Blood in urine:        Psychiatric    Major depression:         Hematologic    Bleeding problems:    Problems with blood clotting too easily:        Skin    Rashes or ulcers:        Constitutional    Fever or chills:      PHYSICAL EXAM:   Vitals:   05/11/23 0831 05/11/23 0833  BP: (!) 145/77 (!) 148/80  Pulse: 62   Resp: 20   Temp: 98 F (36.7 C)   SpO2:  96%   Weight: 207 lb (93.9 kg)   Height: 6\' 3"  (1.905 m)     GENERAL: The patient is a well-nourished male, in no acute  distress. The vital signs are documented above. CARDIAC: There is a regular rate and rhythm.  PULMONARY: Non-labored respirations ABDOMEN: Soft and non-tender MUSCULOSKELETAL: There are no major deformities or cyanosis. NEUROLOGIC: No focal weakness or paresthesias are detected. SKIN: There are no ulcers or rashes noted. PSYCHIATRIC: The patient has a normal affect.  STUDIES:   I have reviewed the following: EVAR ultrasound: Abdominal Aorta: Patent endovascular aneurysm repair with evidence of  endoleak. CT 10/14/22: 5 x 4.8 cm Type 2 endoleak at level of the IMA. The  largest aortic diameter remains essentially unchanged compared to prior  exam.  CaRight Carotid: The ECA appears >50% stenosed. Patent stent.   Left Carotid: The ECA appears >50% stenosed. Patent stent.   Vertebrals:  Bilateral vertebral arteries demonstrate antegrade flow.  Subclavians: Normal flow hemodynamics were seen in bilateral subclavian               arteries. rotid:  MEDICAL ISSUES:   AAA: The patient has a persistent type II endoleak coming off the inferior mesenteric artery.  Maximal aortic diameter is unchanged at 5 cm.  I will continue surveillance imaging.  I did discuss the significance of endoleak and the treatment options.  Carotid: The patient is status post bilateral TCAR's.  He recently had a CT angiogram of the neck for a Parkinson's workup that his stent is partially collapsed.  This looks like it a anterior calcified plaque.  I do not think this lesion is hemodynamically significant and so we will continue to follow this.  I will repeat his ultrasound in 6 months.    Charlena Cross, MD, FACS [facility] [phone] Pager [phone]

## 2023-05-18 ENCOUNTER — Telehealth (HOSPITAL_COMMUNITY): Payer: Self-pay | Admitting: Cardiology

## 2023-05-18 NOTE — Telephone Encounter (Signed)
Patient called to report weight gain over the past week Normally 200 lbs-weight 214 Weight recheck this afternoon 210  Pt does have increase in SOB and mild edema Pt has lasix at home and took 20 mg for the past 4 days   Please advise further as weight is still elevated

## 2023-05-19 ENCOUNTER — Other Ambulatory Visit (HOSPITAL_COMMUNITY): Payer: Self-pay | Admitting: Family Medicine

## 2023-05-19 MED ORDER — FUROSEMIDE 20 MG PO TABS: 40.0000 mg | ORAL_TABLET | Freq: Every day | ORAL | 3 refills | Status: DC

## 2023-05-19 MED ORDER — POTASSIUM CHLORIDE CRYS ER 10 MEQ PO TBCR: 10.0000 meq | EXTENDED_RELEASE_TABLET | Freq: Once | ORAL | 3 refills | Status: DC

## 2023-05-19 NOTE — Telephone Encounter (Signed)
PT AWARE  

## 2023-05-22 NOTE — Progress Notes (Signed)
[facility] Note  Date:  05/26/2023   ID:  Eric Gordon, DOB 1930-10-10, MRN [id number]   Provider location: [place] [facility] Type of Visit: Established patient  PCP:  Creola Corn, MD  EP: Dr. Lalla Brothers HF Cardiologist:  Dr. Shirlee Latch   HPI: Eric Gordon is a 89 y.o. male with history of CAD s/p CABG.  He has had trouble long-term with exertional dyspnea.  Dyspnea triggered evaluation in 2012 leading to CABG but was not resolved by CABG.  PFTs in 3/14 showed only mild obstructive airways disease and V/Q scan showed no PE.  Echo in 9/14 showed normal LV systolic function with moderately dilated RV. RHC in 2/15 showed normal right and left heart filling pressures and normal PA pressure.  Finally, he had a CPX in 3/15 that showed normal capacity compared to age-matched sedentary norms.  He was noted to have chronotropic incompetence.  At a prior appointment, I took him off metoprolol given chronotropic incompetence noted on CPX.  Dyspnea improved significantly with weight loss.  He had Cardiolite in 8/18 with EF 52%, no ischemia or infarction.    He was admitted in 1/20 with NSTEMI.  LHC showed new occlusion of the branch of SVG-ramus and OM that touched down on OM.  There were also serial 70% stenoses in the mid/distal RCA.  There was not thought to be an interventional option and patient was treated medically. He was discharged home but presented a couple days later with recurrent chest pain and dyspnea.  Cath was repeated showing no change from prior.  This admission, he had FFR of the RCA which did not suggest hemodynamic significance.  Echo showed EF 55-60%, normal RV.  CTA did not show a PE. He was noted to be volume overloaded and was diuresed.  He was also noted to be in atrial fibrillation with RVR transiently.  ASA/Plavix was stopped and Eliquis was started. He was discharged to SNF.  Atrial fibrillation recurred and he was started on amiodarone  with conversion back to NSR.   In 4/20, he was admitted with left spontaneous PTX requiring chest tube.  After this, he developed recurrent left pleural effusions requiring thoracentesis x 3 so far.  Pleural fluid was exudative with eosinophil predominance.  With elevated ESR, there was concern that amiodarone could be involved, so this medication was stopped.  CT chest in 6/20 showed LLL consolidation/collapse with small left effusion, there was a rim-enhancing lesion posterior to the heart between esophagus and left atrium, ?loculated fluid.  TEE was done in 7/20 and confirmed loculated pleural effusion behind the left atrium.  In 7/20, patient had VATS on the left.   Carotid dopplers in 9/21 showed 80-99% RICA stenosis, 60-79% LICA.  AAA [place] in 9/21 showed 4.5 cm AAA. Patient was referred to Dr. Myra Gianotti for evaluation => he has had TCAR on right in 12/21.  He had left TCAR in 5/22.   Cardiolite in 12/21 with EF 41%, no ischemia, fixed inferolateral defect. Echo in 1/22 showed EF 40-45%, basal to mid inferolateral AK, basal inferior HK, normal RV, mild aortic stenosis mean gradient 11 mmHg.  He was unable to tolerate dapagliflozin and is off it. He was lightheaded on low dose Entresto so this was stopped.   Zio monitor in 1/23 showed 25% atrial flutter.  He was referred to EP, by the time he saw EP, burden of atrial arrhythmias was lower.   Cardiolite in 3/23 showed prior inferolateral  and inferior infarct, no ischemia.   Patient was admitted in 4/23 with atypical chest pain.  This likely was due to cough/bronchitis.  CTA chest showed inferolateral wall pseudoaneurysm, this is chronic.  Echo in 4/23 showed EF 40-45%, akinesis of the inferolateral wall, basal inferolateral pseudoaneurysm, moderate RV enlargement, normal RV systolic function, mild AS with mean gradient 10 mmHg.   Zio monitor in 5/23 showed 2% AF burden, improved from prior.     Follow up 5/23, stable NYHA II, euvolemic. Off several  meds due to orthostatic symptoms.  Admitted 6/23 with syncope, no evidence for ACS. 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 EP study with inducible VT. Underwent dual chamber Biotronik ICD placement. Discharged home, weight 208 lbs.  VT again in 9/23, treated with ATP.   Follow up 10/23, NYHA III and volume stable. GDMT limited by orthostatic symptoms and need for midodrine. Mexiletine started for recurrent VT.  S/p aortobi-iliac endograft repair of infrarenal abdominal aortic aneurysm 12/23. Type 2 endoleak noted on CTA in 1/24.   Patient was admitted in 1/24 with weakness, dyspnea, cough, atypical chest pain.  CTA chest showed not PE.  He ended up having cardiac cath, this showed no significant change to his coronary anatomy from the past.  Filling pressures were not elevated on RHC.  He was discharged home.   Echo in 2/24 showed EF 45-50%, basal-mid inferior/inferolateral akinesis, low normal RV function, moderate AS with mean gradient 11 and AVA 1.13 cm^2.   Admitted 3/24 with syncopal events, one event resulting in trauma to head and back. One event coincide with VT. Neuro consulted, suggested conservative non-op treatment, back brace for 6-8 weeks. Discharged on amiodarone taper and off mexiletine.  Patient again tolerated amiodarone poorly with increased dyspnea (happened last time he was on amiodarone).  This was then stopped and mexiletine was started.   Today he returns for HF and CAD follow up. Overall feeling fine. He had a mechanical fall 2 weeks ago, and hit his left flank. He is clear this was not a syncopal event. This area has been sore ever since. Breathing about the same, not SOB walking short distnaces on flat ground. This waxes and wanes, but he does feel breathing has improved off the amiodarone. Had AF alert on watch on 05/08/23, asymptomatic. He has occasional positional dizziness and rare atypical "bursts" of chest pain. Denies  palpitations, abnormal bleeding, edema, or PND/Orthopnea. Appetite ok. No fever or chills. Weight at home 204 pounds. Taking all medications. Asking if he can work out.  Biotronik device unable to be interrogated.   ECG (personally reviewed): NSR, RBBB, LAFB QRS 156 msec   Labs (2/20): LFTs normal, pro BNP 842 Labs (3/20): K 3.8, creatinine 1.48, TSH mildly elevated at 6.97, free T3 and free T4 normal Labs (4/20): K 3.9, creatinine 1.36 Labs (2/20): LDL 52 Labs (8/20): BNP 241, creatinine 1.21  Labs (9/20): BNP 244, K 4, creatinine 1.12 Labs (2/21): K 4.3, creatinine 1.26, BNP 271 Labs (6/21): K 4.2, creatinine 1.11, LDL 57 Labs (6/22): K 4.3, creatinine 1.16, LDL 49 Labs (8/22): K 4.3, creatinine 1.37 Labs (12/22): K 3.9, creatinine 1.18 Labs (4/23): K 4.7, creatinine 1.35, BNP 253 Labs (6/23): K 4.1, creatinine 0.95 => 1.28 Labs (10/23): K 4.4, creatinine 1.26 Labs (11/23): K 4.3, creatinine 1.28 Labs (1/24): K 4.2, creatinine 1.1, BNP 202, HS-TnI negative  Labs (3/24): K 4.1, creatinine 1.49 Labs (5/24): K 4.5, creatinine 1.89, urine immunofixation negative,  myeloma panel negative Labs (6/24): K 5.0, creatinine 1.67, LDL 73   PMH: 1. Essential tremor 2. CAD: s/p CABG in 3/12.   - Cardiolite (8/18): EF 52%, no ischemia/infarction.  - NSTEMI (1/20):  LHC showed new occlusion of the branch of SVG-ramus and OM that touched down on OM.  There were also serial 70% stenoses in the mid/distal RCA.  FFR of RCA was negative.  - Cardiolite (12/21): EF 41%, no ischemia, fixed inferolateral defect - Cardiolite (3/23): LV EF 37%, fixed basal-mid inferolateral/inferior defect, no ischemia.  - LHC (1/24): Totally occluded SVG-PDA, occlusion of the branch of sequential SVG-ramus and OM that touches down on OM but patent ramus branch, occluded SVG-D, patent LIMA-LAD. Similar to prior, no intervention.  3. Atrial fibrillation: Only noted post-op CABG.  4. PE: Post-op CABG in 2012.  5. AAA: 3.6 cm  on [place] in 3/14.  3.6 cm on [place] in 3/15. 3.6 cm on [place] 8/16. 4.1 cm on [place] 4/17.  - AAA [place] (5/18): 4.1 cm AAA.  - AAA [place] (5/19): 4.1 cm AAA.  - AAA [place] (4/20): 4.1 cm AAA.  - AAA [place] (9/21): 4.5 cm AAA - AAA [place] (9/22): 4.5 cm AAA - AAA [place] (10/23): 4.9 cm AAA - s/p EVAR 12/23, type 2 endoleak noted on 1/24 CTA.  6. OSA: On CPAP.  7. Carotid stenosis: Carotid dopplers (9/14) with 60-79% LICA stenosis.  Carotid dopplers (9/15) with 60-79% LICA stenosis. Carotid dopplers (3/16) with 60-79% RCIA stenosis, 40-59% LICA stenosis.  - Carotid dopplers (8/16) with 40-59% RICA stenosis, 60-79% LICA stenosis.  - Carotid dopplers (8/17) with 60-79% RICA, 40-59% LICA. - Carotid dopplers (9/18) with 40-59% RICA, 60-79% LICA.   - Carotid dopplers (9/19) with 40-59% RICA, 60-79% LICA.  - Carotid dopplers (9/20) with 60-79% BICA - Carotid dopplers (9/21): 80-99% RICA, 60-79% LICA - Right TCAR 12/21, left TCAR 5/22.  - Carotid dopplers (4/23): patent ICA stents.  8. Asthma 9. PNA x 2 10. Chronic diastolic CHF/dyspnea: Echo (9/14) with EF 60-65%, mild LVH, very mild AS with mean gradient 10 mmHg, RV moderately dilated.  PFTs (3/14) showed mild obstructive airways disease.  V/Q scan (3/14) with no PE.  ENT workup for upper respiratory causes was negative.  RHC (2/15) with mean RA 7, PA 32/12, mean PCWP 13, CI 3.18.  CPX (3/15) with peak VO2 16.4, VE/VCO2 33; normal when compared to age-matched sedentary normals; chronotropic incompetence was noted.  Dyspnea was improved with weight loss.  - Echo (8/17): EF 60-65%, mild AS.  - Echo (1/20): EF 55-60%, normal RV size and systolic function.  - Echo (6/20): EF 55%, mild LVH, mild AS, normal RV size and systolic function, ?LA mass or mass impinging on posterior LA.  - TEE (7/20): EF 50-55%, mild LVH, basal inferolateral aneurysm, normal RV size/systolic function, loculated pleural fluid behind LA.  - Echo (1/22): EF 40-45%, basal to mid inferolateral AK, basal inferior HK,  normal RV, mild aortic stenosis mean gradient 11 mmHg. - Echo (2/23): EF 45-50%, moderate LVH, normal RV, mild AS mean gradient 15 mmHg with IVC normal.  - Echo (4/23): EF 40-45%, akinesis of the inferolateral wall, basal inferolateral pseudoaneurysm, moderate RV enlargement, normal RV systolic function, mild AS with mean gradient 10 mmHg.  - Echo (6/23): EF 40-45% and pre-existing aneurysm of the basal inferolateral wall, mild-mod AS. - RHC (1/24): mean RA 1, PA 19/10, mean PCWP 5, CI 2.66.  - Echo (2/24): EF 45-50%, basal-mid inferior/inferolateral akinesis, low  normal RV function, moderate AS with mean gradient 11 and AVA 1.13 cm^2. - PYP scan (5/24): Grade 1, H/CL 1.2 => not suggestive of transthyretin cardiac amyloidosis.  11. HTN 12. Aortic stenosis: Moderate on 2/24 echo.   13. Ascending aortic aneurysm: CTA chest with 4.4 cm ascending aorta in 1/20.  - 4.2 cm ascending aorta on CT chest 6/20.  14. Atrial fibrillation: Paroxysmal.  15. CKD: Stage 3.  16. Left lung spontaneous PTX then recurrent left pleural effusion.  - Left VATS in 7/20.  17. Palpitations:  - Zio patch (6/20): NSR with 1 short NSVT run and few short SVT runs, no atrial fibrillation.  - Zio patch 2 wks (2/21): 6 short NSVT runs, 66 short SVT runs, rare PACs/PVCs.  - Zio patch (5/23): 2% AF burden, 4 NSVT runs longest 5 beats.  18. COVID-19 infection 4/22.  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  20. VT: Syncope in 6/23 with inducible VT. s/p dual chamber Biotronik ICD - Dyspnea with amiodarone use.  21. Essential tremor  Current Outpatient Medications  Medication Sig Dispense Refill   apixaban (ELIQUIS) 5 MG TABS tablet Take 1 tablet (5 mg total) by mouth 2 (two) times daily. 60 tablet    atorvastatin (LIPITOR) 80 MG tablet Take 1 tablet (80 mg total) by mouth daily. 30 tablet 6   cetirizine (ZYRTEC) 10 MG tablet Take 10 mg by mouth daily.     Cholecalciferol (VITAMIN D-3) 25 MCG (1000 UT) CAPS Take 1,000  Units by mouth in the morning.     Cyanocobalamin (VITAMIN B 12 PO) Take 1 tablet by mouth daily.     finasteride (PROSCAR) 5 MG tablet Take 5 mg by mouth daily.     furosemide (LASIX) 20 MG tablet Take 20 mg by mouth 2 (two) times daily.     isosorbide mononitrate (IMDUR) 30 MG 24 hr tablet Take 0.5 tablets (15 mg total) by mouth daily.     levothyroxine (SYNTHROID) 50 MCG tablet Take 50 mcg by mouth at bedtime.     metoprolol succinate (TOPROL XL) 25 MG 24 hr tablet Take 1 tablet (25 mg total) by mouth daily.     mexiletine (MEXITIL) 150 MG capsule Take 150 mg by mouth 2 (two) times daily.     midodrine (PROAMATINE) 2.5 MG tablet Take 1 tablet (2.5 mg total) by mouth 2 (two) times daily with a meal. 180 tablet 3   Multiple Vitamin (MULTIVITAMIN WITH MINERALS) TABS tablet Take 1 tablet by mouth in the morning.     pantoprazole (PROTONIX) 40 MG tablet Take 40 mg by mouth daily before breakfast.     potassium chloride (KLOR-CON M) 10 MEQ tablet Take 1 tablet (10 mEq total) by mouth daily. 30 tablet 3   spironolactone (ALDACTONE) 25 MG tablet Take 0.5 tablets (12.5 mg total) by mouth at bedtime. 15 tablet 2   No current facility-administered medications for this encounter.   Allergies:   Ancef [cefazolin sodium], Empagliflozin, and Vancomycin   Social History:  The patient  reports that he quit smoking about 39 years ago. His smoking use included cigarettes. He started smoking about 74 years ago. He has a 35 pack-year smoking history. He has never used smokeless tobacco. He reports current alcohol use of about 2.0 standard drinks of alcohol per week. He reports that he does not use drugs.   Family History:  The patient's family history includes Coronary artery disease in his mother; Heart disease in his mother; Hypertension in his mother.  ROS:  Please see the history of present illness.   All other systems are personally reviewed and negative.   Recent Labs: 11/28/2022: TSH 4.040 12/01/2022:  ALT 11 12/03/2022: Magnesium 1.9 03/19/2023: B Natriuretic Peptide 229.5; BUN 32; Creatinine, Ser 1.67; Hemoglobin 14.7; Platelets 185; Potassium 5.0; Sodium 136  Personally reviewed   BP (!) 148/76   Pulse 73   Wt 94.3 kg (207 lb 12.8 oz)   SpO2 95%   BMI 25.97 kg/m   Wt Readings from Last 3 Encounters:  05/26/23 94.3 kg (207 lb 12.8 oz)  05/11/23 93.9 kg (207 lb)  04/07/23 92.6 kg (204 lb 3.2 oz)    Physical Exam: General:  NAD. No resp difficulty, walked into clinic HEENT: Normal Neck: Supple. No JVD. Carotids 2+ bilat; no bruits. No lymphadenopathy or thryomegaly appreciated. Cor: PMI nondisplaced. Regular rate & rhythm. No rubs, gallops or murmurs. Lungs: Coarse BS LLL Abdomen: Soft, nontender, nondistended. No hepatosplenomegaly. No bruits or masses. Good bowel sounds. Left flank TTP, mild edema left upper flank Extremities: No cyanosis, clubbing, rash, edema; compression hose on Neuro: Alert & oriented x 3, cranial nerves grossly intact. Moves all 4 extremities w/o difficulty. Affect pleasant.  Assessment & Plan: 1. Syncope: 6/23, most likely cardiogenic. Known tachy-brady syndrome with outpatient monitor showing post-conversion (AF => NSR) symptomatic pauses, up to 3 sec long. Has bifascicular block.  EP study in 6/23 w/ inducible VT. Biotronik ICD placed.  2. Chronic HF with mid range EF: TEE in 7/20 showed EF 50-55%, basal inferolateral aneurysm, and normal RV.  Echo in 1/22 with EF 40-45%, basal-mid inferolateral akinesis and inferior hypokinesis, normal RV.  Echo in 6/23 showed EF 40-45% and pre-existing aneurysm of the basal inferolateral wall, mild-mod AS.  RHC in 1/24 with normal filling pressures and preserved cardiac output.  Echo in 2/24 showed EF 45-50%, basal-mid inferior/inferolateral akinesis, low normal RV function, moderate AS with mean gradient 11 and AVA 1.13 cm^2.  Workup for cardiac amyloidosis was negative (PYP scan in 5/24 not suggestive of TTR cardiac  amyloidosis).  NYHA class II-III symptoms, not volume overloaded on exam.  Dyspnea improved since stopping amiodarone.  GDMT has been very limited by orthostatic symptoms. Suspect deconditioning and lung disease (recurrent left effusion and h/o PTX with likely scarring) play a role in his dyspnea.  - Continue Toprol XL 25 mg daily.  - Continue Lasix, but change to 40 mg daily (has been taking 20 bid) - Continue spironolactone 12.5 mg qhs. - Continue low dose midodrine for now with orthostatic symptoms.  - Off ARB due to orthostatic hypotension. - Unable to tolerate dapaglifozin. - Repeat echo. 3. CAD: s/p CABG 3/12. NSTEMI 1/20, LHC showed new occlusion of the branch of sequential SVG-ramus and OM that touched down on OM.  There were also serial 70% stenoses in the mid/distal RCA.  FFR of RCA was negative.  No interventional target.  Cardiolite in 12/21 showed inferolateral infarction with no ischemia, consistent with findings on NSTEMI in 1/20. Cardiolite in 3/23 showed inferior/inferolateral fixed defect with no ischemia.  LHC in 1/24 showed unchanged coronary anatomy with occluded branch of sequential SVG-ramus and OM touching down on OM, patent LIMA-LAD, occluded SVG-D, occluded SVG-PDA; no intervention. He has rare, atypical chest pain, ECG without acute changes today. Suspect this is related to his recent fall as he also has chest tenderness to palpation.  - Will update echo as above. Could consider repeating Cardiolite if symptoms persist. - No ASA given Eliquis use.  -  Continue atorvastatin 80 daily.   - Continue Imdur 15 mg daily.  - Continue Toprol XL 25 mg daily.  4. Hyperlipidemia: Continue statin, LDL 53 2/24 5. Spontaneous left PTX with recurrent left-sided pleural effusion:  He is s/p left VATS/pleurodesis in 7/20.    6. Carotid stenosis: S/p bilateral TCARs, followed at [facility].     7. AAA: 4.9 cm AAA in 10/23.  S/p EVAR 12/23.  Has type 2 endoleak by CTA in 1/24.  - Follow at [facility].  8.  Aortic stenosis: Moderate by echo in 2/24.   9. Atrial fibrillation: Paroxysmal. He is significantly symptomatic while in atrial fibrillation. He has gone back off amiodarone given pulmonary symptoms again while on it.  Not candidate for sotalol per EP.  NSR today.   - Continue Eliquis 5 mg bid. No bleeding issues. Recent CBC stable 10. 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  This has been stable.  11. VT: Recurrent, suspect scar in the inferolateral wall is nidus.  Has Biotronik ICD.  He had VT again in 9/23, not associated with chest pain/ischemic symptoms.  VT noted with syncopal episode 11/27/22. Has not tolerated amiodarone due to lung toxicity and mexiletine started.  - Continue mexiletine.  - Continue Toprol XL 25 mg daily.  12. CKD stage 3: BMET today.   Follow up 4 months with Dr. Shirlee Latch.  [facility] FNP-BC 05/26/2023

## 2023-05-26 ENCOUNTER — Ambulatory Visit (HOSPITAL_COMMUNITY)
Admission: RE | Admit: 2023-05-26 | Discharge: 2023-05-26 | Disposition: A | Discharge status: Home / Self Care | Payer: Medicare Other | Source: Ambulatory Visit | Attending: Family Medicine | Admitting: Family Medicine

## 2023-05-26 ENCOUNTER — Encounter (HOSPITAL_COMMUNITY): Payer: Self-pay

## 2023-05-26 VITALS — BP 148/76 | HR 73 | Wt 207.8 lb

## 2023-05-26 DIAGNOSIS — Z7901 Long term (current) use of anticoagulants: Secondary | ICD-10-CM | POA: Insufficient documentation

## 2023-05-26 DIAGNOSIS — I714 Abdominal aortic aneurysm, without rupture, unspecified: Secondary | ICD-10-CM | POA: Diagnosis not present

## 2023-05-26 DIAGNOSIS — R55 Syncope and collapse: Secondary | ICD-10-CM

## 2023-05-26 DIAGNOSIS — I9789 Other postprocedural complications and disorders of the circulatory system, not elsewhere classified: Secondary | ICD-10-CM | POA: Insufficient documentation

## 2023-05-26 DIAGNOSIS — I253 Aneurysm of heart: Secondary | ICD-10-CM | POA: Diagnosis not present

## 2023-05-26 DIAGNOSIS — I5022 Chronic systolic (congestive) heart failure: Secondary | ICD-10-CM | POA: Diagnosis not present

## 2023-05-26 DIAGNOSIS — I25810 Atherosclerosis of coronary artery bypass graft(s) without angina pectoris: Secondary | ICD-10-CM | POA: Insufficient documentation

## 2023-05-26 DIAGNOSIS — E782 Mixed hyperlipidemia: Secondary | ICD-10-CM

## 2023-05-26 DIAGNOSIS — I452 Bifascicular block: Secondary | ICD-10-CM | POA: Insufficient documentation

## 2023-05-26 DIAGNOSIS — I252 Old myocardial infarction: Secondary | ICD-10-CM | POA: Diagnosis not present

## 2023-05-26 DIAGNOSIS — E785 Hyperlipidemia, unspecified: Secondary | ICD-10-CM | POA: Insufficient documentation

## 2023-05-26 DIAGNOSIS — I729 Aneurysm of unspecified site: Secondary | ICD-10-CM | POA: Diagnosis not present

## 2023-05-26 DIAGNOSIS — I130 Hypertensive heart and chronic kidney disease with heart failure and stage 1 through stage 4 chronic kidney disease, or unspecified chronic kidney disease: Secondary | ICD-10-CM | POA: Insufficient documentation

## 2023-05-26 DIAGNOSIS — I6529 Occlusion and stenosis of unspecified carotid artery: Secondary | ICD-10-CM

## 2023-05-26 DIAGNOSIS — N183 Chronic kidney disease, stage 3 unspecified: Secondary | ICD-10-CM | POA: Insufficient documentation

## 2023-05-26 DIAGNOSIS — I350 Nonrheumatic aortic (valve) stenosis: Secondary | ICD-10-CM | POA: Insufficient documentation

## 2023-05-26 DIAGNOSIS — Z95810 Presence of automatic (implantable) cardiac defibrillator: Secondary | ICD-10-CM | POA: Insufficient documentation

## 2023-05-26 DIAGNOSIS — I480 Paroxysmal atrial fibrillation: Secondary | ICD-10-CM | POA: Insufficient documentation

## 2023-05-26 DIAGNOSIS — J90 Pleural effusion, not elsewhere classified: Secondary | ICD-10-CM | POA: Diagnosis not present

## 2023-05-26 DIAGNOSIS — Z79899 Other long term (current) drug therapy: Secondary | ICD-10-CM | POA: Insufficient documentation

## 2023-05-26 DIAGNOSIS — I472 Ventricular tachycardia, unspecified: Secondary | ICD-10-CM | POA: Diagnosis not present

## 2023-05-26 DIAGNOSIS — I495 Sick sinus syndrome: Secondary | ICD-10-CM | POA: Insufficient documentation

## 2023-05-26 DIAGNOSIS — I2510 Atherosclerotic heart disease of native coronary artery without angina pectoris: Secondary | ICD-10-CM

## 2023-05-26 LAB — BASIC METABOLIC PANEL
Anion gap: 10 (ref 5–15)
BUN: 34 mg/dL — ABNORMAL HIGH (ref 8–23)
CO2: 28 mmol/L (ref 22–32)
Calcium: 9.7 mg/dL (ref 8.9–10.3)
Chloride: 99 mmol/L (ref 98–111)
Creatinine, Ser: 1.55 mg/dL — ABNORMAL HIGH (ref 0.61–1.24)
GFR, Estimated: 43 mL/min — ABNORMAL LOW (ref >60)
Glucose, Bld: 100 mg/dL — ABNORMAL HIGH (ref 70–99)
Potassium: 4.4 mmol/L (ref 3.5–5.1)
Sodium: 137 mmol/L (ref 135–145)

## 2023-05-26 LAB — BRAIN NATRIURETIC PEPTIDE: B Natriuretic Peptide: 217.4 pg/mL — ABNORMAL HIGH (ref 0.0–100.0)

## 2023-05-26 MED ORDER — POTASSIUM CHLORIDE CRYS ER 10 MEQ PO TBCR: 10.0000 meq | EXTENDED_RELEASE_TABLET | Freq: Every day | ORAL | 3 refills | Status: DC

## 2023-05-26 MED ORDER — FUROSEMIDE 20 MG PO TABS: 40.0000 mg | ORAL_TABLET | Freq: Every day | ORAL | 3 refills | Status: DC

## 2023-05-26 NOTE — Patient Instructions (Signed)
Medication Changes:  TAKE LASIX 40MG  ONCE DAILY   Lab Work:  Labs done today, your results will be available in MyChart, we will contact you for abnormal readings.  Special Instructions // Education:  Your physician has requested that you have an echocardiogram IN 4 MONTHS AS SCHEDULED. Echocardiography is a painless test that uses sound waves to create images of your heart. It provides your doctor with information about the size and shape of your heart and how well your heart's chambers and valves are working. This procedure takes approximately one hour. There are no restrictions for this procedure. Please do NOT wear cologne, perfume, aftershave, or lotions (deodorant is allowed). Please arrive 15 minutes prior to your appointment time.   Follow-Up in: 4 MONTHS AS SCHEDULED   At the [facility], you and your health needs are our priority. We have a designated team specialized in the treatment of Heart Failure. This Care Team includes your primary Heart Failure Specialized Cardiologist (physician), Advanced Practice Providers (APPs- Physician Assistants and Nurse Practitioners), and Pharmacist who all work together to provide you with the care you need, when you need it.   You may see any of the following providers on your designated Care Team at your next follow up:  Dr. Arvilla Meres Dr. Marca Ancona Dr. Marcos Eke, NP Robbie Lis, [place] [facility] [place], [place] Brynda Peon, NP Karle Plumber, PharmD   Please be sure to bring in all your medications bottles to every appointment.   Need to Contact us:  If you have any questions or concerns before your next appointment please send [place] a message through [place] or call our office at [phone].    TO LEAVE A MESSAGE FOR THE NURSE SELECT OPTION 2, PLEASE LEAVE A MESSAGE INCLUDING: YOUR NAME DATE OF BIRTH CALL BACK NUMBER REASON FOR CALL**this is important as we prioritize the  call backs  YOU WILL RECEIVE A CALL BACK THE SAME DAY AS LONG AS YOU CALL BEFORE 4:00 PM

## 2023-05-27 ENCOUNTER — Ambulatory Visit (INDEPENDENT_AMBULATORY_CARE_PROVIDER_SITE_OTHER): Payer: Medicare Other

## 2023-05-27 DIAGNOSIS — I472 Ventricular tachycardia, unspecified: Secondary | ICD-10-CM

## 2023-05-28 LAB — CUP PACEART REMOTE DEVICE CHECK
Date Time Interrogation Session: 20240904074021
Implantable Lead Connection Status: 753985
Implantable Lead Connection Status: 753985
Implantable Lead Implant Date: 20230606
Implantable Lead Implant Date: 20230606
Implantable Lead Location: 753859
Implantable Lead Location: 753860
Implantable Lead Model: 377
Implantable Lead Model: 402266
Implantable Lead Serial Number: 8000807784
Implantable Lead Serial Number: 81503989
Implantable Pulse Generator Implant Date: 20230606
Pulse Gen Model: 429534
Pulse Gen Serial Number: 84900693
Zone Setting Status: 755011

## 2023-06-01 ENCOUNTER — Other Ambulatory Visit: Payer: Self-pay

## 2023-06-01 DIAGNOSIS — I6523 Occlusion and stenosis of bilateral carotid arteries: Secondary | ICD-10-CM

## 2023-06-01 DIAGNOSIS — I7143 Infrarenal abdominal aortic aneurysm, without rupture: Secondary | ICD-10-CM

## 2023-06-10 NOTE — Progress Notes (Signed)
Remote ICD transmission.   

## 2023-06-10 NOTE — Progress Notes (Deleted)
NEUROLOGY FOLLOW UP OFFICE NOTE  [name] [id number]  Subjective:  Eric Gordon is a 89 y.o. year old right-handed male with a medical history of afib (on eliquis), CAD s/p CABG, carotid stenosis s/p b/l stent, HTN, HLD, CHF, hypothyroidism, essential tremor who we last saw on 03/05/23 for left sided weakness and tremor.  To briefly review: 03/05/23: Patient is not sure when his symptoms started. He noticed numbness in the left leg for the last month or so. He did not notice left sided weakness. When he was at his PCP appointment at the end of May 2024, there was left sided weakness found. Left arm was also noted to be weak. He denies any numbness in left arm. He had a CT head on 02/18/23 that showed no obvious stroke. Patient cannot get MRI due to defibrillator. The family did not notice a significant change in the patient other than him mentioning the left leg numbness. Patient is on eliquis 5 mg BID for afib and atorvastatin 80 mg daily for HLD.   At baseline, patient walks with a walker or cane. He had 2 falls about 3 months ago and fractured the T9 vertebra. Per patient, he passed out but there has been no clear cause found. He has not had further episodes. Patient saw neurosurgery for his vertebra fracture, given a brace that he wore for 3 months, and recovered somewhat. He still has some pain in his mid and low back, but no neck pain. Patient is currently doing PT.   Patient has had tremor, diagnosed as essential tremor. It was initially affecting the right arm, but the left hand started about 1 month ago. He does not know of a family history of tremor. He has not noticed if alcohol helps his tremor. He had treatment years ago, but does not know what. He is currently on metoprolol. His tremor has been worsening, especially in his right hand, affecting his eating. He was also prescribed primidone 25 mg qhs by PCP on 02/18/23, but patient read side effects and has not taken it.     Patient is a non-smoker. He rarely will have a vodka or beer. There is no drug use.  Most recent Assessment and Plan (03/05/23): Eric Gordon is a 89 y.o. male who presents for evaluation of left sided weakness, left leg numbness, and worsening tremor. He has a relevant medical history of afib (on eliquis), CAD s/p CABG, carotid stenosis s/p b/l stent, HTN, HLD, CHF, hypothyroidism, essential tremor. His neurological examination is pertinent for possible weakness of left side, mostly in proximal muscles. There is no clear distribution of sensory loss in lower extremities as findings were patchy in both the right and left lower extremities. Available diagnostic data is significant for CT head that was normal (1 month after onset of left leg numbness). MRI lumbar spine from 2019 showed lumbar stenosis.    This is a complex case given patient's multiple co-morbidities. Stroke is a possibility, though not clear. He certainly has multiple risk factors for stroke, including afib, heart failure, HLD, HTN, and carotid stenosis. He has not missed any doses of eliquis. If symptoms of equivocal left sided weakness were dismissed, the left leg numbness might be explained by lumbosacral radiculopathy, given known spine disease. Without clear evidence of stroke, I would keep medications as is and recommend PT, which patient is already getting. If there were a clear stroke, then a discussion about possible eliquis failure and having to switch may  occur with the help of cardiology.   In terms of tremor, symptoms do appear consistent with essential tremor. Patient is currently on metoprolol, but tremor is worsening. PCP prescribed primidone, but patient is reluctant to try it prior to talking to cardiology. I explained that he could increase metoprolol, though this may not be the best idea as he reports bradycardia, or start the primidone. He will discuss with cardiology.   PLAN: -Blood work: B12 -CT head wo  contrast -CTA head and neck -Continue eliquis 5 mg BID -Continue atorvastatin 80 mg daily -Continue physical therapy   -Patient and family want to discuss medications for ET with cardiology - options include starting Primidone 25 mg qhs or increased metoprolol dosage  Since their last visit: B12 was normal. CT head showed no new infarcts. CTA head and neck showed left carotid stent had partially collapsed with possible endoleak, resulting in 40% proximal in stent stenosis. I discussed this with patient on 03/30/23. He saw his vascular surgeon, Dr. Myra Gianotti on 05/11/23 who did not think this was hemodynamically significant and is planning to repeat CUS in 6 months.  Left sided weakness?*** Tremor?***Medication?***  MEDICATIONS:  Outpatient Encounter Medications as of 06/24/2023  Medication Sig   apixaban (ELIQUIS) 5 MG TABS tablet Take 1 tablet (5 mg total) by mouth 2 (two) times daily.   atorvastatin (LIPITOR) 80 MG tablet Take 1 tablet (80 mg total) by mouth daily.   cetirizine (ZYRTEC) 10 MG tablet Take 10 mg by mouth daily.   Cholecalciferol (VITAMIN D-3) 25 MCG (1000 UT) CAPS Take 1,000 Units by mouth in the morning.   Cyanocobalamin (VITAMIN B 12 PO) Take 1 tablet by mouth daily.   finasteride (PROSCAR) 5 MG tablet Take 5 mg by mouth daily.   furosemide (LASIX) 20 MG tablet Take 2 tablets (40 mg total) by mouth daily.   isosorbide mononitrate (IMDUR) 30 MG 24 hr tablet Take 0.5 tablets (15 mg total) by mouth daily.   levothyroxine (SYNTHROID) 50 MCG tablet Take 50 mcg by mouth at bedtime.   metoprolol succinate (TOPROL XL) 25 MG 24 hr tablet Take 1 tablet (25 mg total) by mouth daily.   mexiletine (MEXITIL) 150 MG capsule Take 150 mg by mouth 2 (two) times daily.   midodrine (PROAMATINE) 2.5 MG tablet Take 1 tablet (2.5 mg total) by mouth 2 (two) times daily with a meal.   Multiple Vitamin (MULTIVITAMIN WITH MINERALS) TABS tablet Take 1 tablet by mouth in the morning.   pantoprazole  (PROTONIX) 40 MG tablet Take 40 mg by mouth daily before breakfast.   potassium chloride (KLOR-CON M) 10 MEQ tablet Take 1 tablet (10 mEq total) by mouth daily.   spironolactone (ALDACTONE) 25 MG tablet Take 0.5 tablets (12.5 mg total) by mouth at bedtime.   No facility-administered encounter medications on file as of 06/24/2023.    PAST MEDICAL HISTORY: Past Medical History:  Diagnosis Date   Abdominal aortic aneurysm (AAA) (HCC)    Achilles tendinitis of right lower extremity    AICD (automatic cardioverter/defibrillator) present    Arthritis    "my whole body" (03/19/2018)   Atherosclerosis of coronary artery bypass graft w/o angina pectoris    Atrial fibrillation (HCC)    Barrett's esophagus    CAD (coronary artery disease)    Carotid artery disease (HCC)    CHF (congestive heart failure) (HCC)    Chronic anticoagulation    PE   Chronic lower back pain    Dyspnea  Dysrhythmia    Essential tremor    GERD (gastroesophageal reflux disease)    High cholesterol    Hyperlipidemia    Hyperplasia, prostate    Hypertension    Hypothyroidism    Myocardial infarction [facility])    2 in Jan. 2019   Nail dystrophy    OSA on CPAP    uses CPAP   Osteoarthrosis    Pneumonia    "now and once before" (03/19/2018)   Pulmonary embolism [facility])    April 2012 after CABG   S/P CABG (coronary artery bypass graft)    Sleep apnea    Spontaneous pneumothorax    left spontaneous pneumothorax/left pleural effusion, s/p chest tube 01/09/19; thoracentesis x2, s/p left VATS/tacl pleurodesis 04/12/19    PAST SURGICAL HISTORY: Past Surgical History:  Procedure Laterality Date   ABDOMINAL AORTIC ENDOVASCULAR STENT GRAFT N/A 08/06/2022   Procedure: ABDOMINAL AORTIC ENDOVASCULAR STENT GRAFT;  Surgeon: Nada Libman, MD;  Location: [facility] OR;  Service: Vascular;  Laterality: N/A;   ACHILLES TENDON REPAIR Bilateral    2006 & 2004   CARDIAC CATHETERIZATION  11/2010   CORONARY ANGIOGRAPHY N/A 10/22/2018    Procedure: CORONARY ANGIOGRAPHY;  Surgeon: Lennette Bihari, MD;  Location: [facility] INVASIVE CV LAB;  Service: Cardiovascular;  Laterality: N/A;   CORONARY ARTERY BYPASS GRAFT  March 2012   CABG X 5   CORONARY PRESSURE/FFR STUDY N/A 10/22/2018   Procedure: INTRAVASCULAR PRESSURE WIRE/FFR STUDY;  Surgeon: Lennette Bihari, MD;  Location: [facility] INVASIVE CV LAB;  Service: Cardiovascular;  Laterality: N/A;   ESOPHAGOGASTRODUODENOSCOPY (EGD) WITH PROPOFOL N/A 12/10/2020   Procedure: ESOPHAGOGASTRODUODENOSCOPY (EGD) WITH PROPOFOL;  Surgeon: Sherrilyn Rist, MD;  Location: [facility] ENDOSCOPY;  Service: Gastroenterology;  Laterality: N/A;   ICD IMPLANT N/A 02/25/2022   Procedure: ICD IMPLANT;  Surgeon: Lanier Prude, MD;  Location: [facility] INVASIVE CV LAB;  Service: Cardiovascular;  Laterality: N/A;   INGUINAL HERNIA REPAIR Left 1980   IR THORACENTESIS ASP PLEURAL SPACE W/IMG GUIDE  02/04/2019   IR THORACENTESIS ASP PLEURAL SPACE W/IMG GUIDE  02/24/2019   KNEE ARTHROSCOPY Left 2006   LEFT HEART CATH AND CORS/GRAFTS ANGIOGRAPHY N/A 10/18/2018   Procedure: LEFT HEART CATH AND CORS/GRAFTS ANGIOGRAPHY;  Surgeon: Runell Gess, MD;  Location: [facility] INVASIVE CV LAB;  Service: Cardiovascular;  Laterality: N/A;   LEFT HEART CATH AND CORS/GRAFTS ANGIOGRAPHY N/A 10/21/2018   Procedure: LEFT HEART CATH AND CORS/GRAFTS ANGIOGRAPHY;  Surgeon: Lennette Bihari, MD;  Location: [facility] INVASIVE CV LAB;  Service: Cardiovascular;  Laterality: N/A;   PACEMAKER IMPLANT N/A 02/24/2022   Procedure: PACEMAKER IMPLANT;  Surgeon: Duke Salvia, MD;  Location: [facility] INVASIVE CV LAB;  Service: Cardiovascular;  Laterality: N/A;   PENILE PROSTHESIS IMPLANT     PLEURAL BIOPSY Left 04/12/2019   Procedure: Pleural Biopsy;  Surgeon: Delight Ovens, MD;  Location: [facility] OR;  Service: Thoracic;  Laterality: Left;   PLEURAL EFFUSION DRAINAGE Left 04/12/2019   Procedure: Drainage Of Pleural Effusion;  Surgeon: Delight Ovens, MD;  Location: [facility] OR;  Service:  Thoracic;  Laterality: Left;   RIGHT HEART CATHETERIZATION N/A 11/14/2013   Procedure: RIGHT HEART CATH;  Surgeon: Laurey Morale, MD;  Location: [facility] CATH LAB;  Service: Cardiovascular;  Laterality: N/A;   RIGHT/LEFT HEART CATH AND CORONARY/GRAFT ANGIOGRAPHY N/A 10/15/2022   Procedure: RIGHT/LEFT HEART CATH AND CORONARY/GRAFT ANGIOGRAPHY;  Surgeon: Kathleene Hazel, MD;  Location: [facility] INVASIVE CV LAB;  Service: Cardiovascular;  Laterality: N/A;  SHOULDER OPEN ROTATOR CUFF REPAIR Right 2003   TALC PLEURODESIS Left 04/12/2019   Procedure: Lurlean Nanny;  Surgeon: Delight Ovens, MD;  Location: [facility] OR;  Service: Thoracic;  Laterality: Left;   TEE WITHOUT CARDIOVERSION N/A 03/24/2019   Procedure: TRANSESOPHAGEAL ECHOCARDIOGRAM (TEE);  Surgeon: Laurey Morale, MD;  Location: [facility] ENDOSCOPY;  Service: Cardiovascular;  Laterality: N/A;   TEE WITHOUT CARDIOVERSION  04/12/2019   Procedure: Transesophageal Echocardiogram (Tee);  Surgeon: Delight Ovens, MD;  Location: [facility] OR;  Service: Thoracic;;   THORACOSCOPY Left 04/12/2019   VIDEO BRONCHOSCOPY (N/A ) VIDEO ASSISTED THORACOSCOPY (Left Chest) TALC PLEURADESIS (Left    TRANSCAROTID ARTERY REVASCULARIZATION  Right 09/13/2020   Procedure: RIGHT TRANSCAROTID ARTERY REVASCULARIZATION USING 9mm X 40mm ENROUTE TRANSCAROTID STEND SYSTEM;  Surgeon: Nada Libman, MD;  Location: [facility] OR;  Service: Vascular;  Laterality: Right;   TRANSCAROTID ARTERY REVASCULARIZATION  Left 02/08/2021   Procedure: LEFT TRANSCAROTID ARTERY REVASCULARIZATION;  Surgeon: Nada Libman, MD;  Location: [facility] OR;  Service: Vascular;  Laterality: Left;   ULTRASOUND GUIDANCE FOR VASCULAR ACCESS  09/13/2020   Procedure: ULTRASOUND GUIDANCE FOR VASCULAR ACCESS;  Surgeon: Nada Libman, MD;  Location: [facility] OR;  Service: Vascular;;   ULTRASOUND GUIDANCE FOR VASCULAR ACCESS Bilateral 08/06/2022   Procedure: ULTRASOUND GUIDANCE FOR VASCULAR ACCESS;  Surgeon: Nada Libman, MD;   Location: [facility] OR;  Service: Vascular;  Laterality: Bilateral;   VIDEO ASSISTED THORACOSCOPY Left 04/12/2019   Procedure: VIDEO ASSISTED THORACOSCOPY;  Surgeon: Delight Ovens, MD;  Location: [facility] OR;  Service: Thoracic;  Laterality: Left;   VIDEO BRONCHOSCOPY N/A 04/12/2019   Procedure: VIDEO BRONCHOSCOPY;  Surgeon: Delight Ovens, MD;  Location: [facility] OR;  Service: Thoracic;  Laterality: N/A;    ALLERGIES: Allergies  Allergen Reactions   Ancef [Cefazolin Sodium] Hives   Empagliflozin Other (See Comments)    Dizziness   Vancomycin Rash    FAMILY HISTORY: Family History  Problem Relation Age of Onset   Coronary artery disease Mother    Hypertension Mother    Heart disease Mother     SOCIAL HISTORY: Social History   Tobacco Use   Smoking status: Former    Current packs/day: 0.00    Average packs/day: 1 pack/day for 35.0 years (35.0 ttl pk-yrs)    Types: Cigarettes    Start date: 02/17/1949    Quit date: 02/18/1984    Years since quitting: 39.3   Smokeless tobacco: Never  Vaping Use   Vaping status: Never Used  Substance Use Topics   Alcohol use: Yes    Alcohol/week: 2.0 standard drinks of alcohol    Types: 2 Standard drinks or equivalent per week    Comment: Occ   Drug use: Never   Social History   Social History Narrative   Right handed       Objective:  Vital Signs:  There were no vitals taken for this visit.  ***  Labs and Imaging review: New results: B12 (03/05/23): 901  03/19/23: CBC unremarkable BMP significant for Cr 1.67, BUN 32 Lipid panel: Component     Latest Ref Rng 03/19/2023  Cholesterol     0 - 200 mg/dL 161   Triglycerides     <150 mg/dL 84   HDL Cholesterol     >40 mg/dL 44   Total CHOL/HDL Ratio     RATIO 3.0   VLDL     0 - 40 mg/dL 17   LDL (calc)     0 -  99 mg/dL 73    CT head and CTA head and neck (03/23/23): FINDINGS: CT HEAD FINDINGS   Brain: No evidence of acute infarction, hemorrhage, hydrocephalus, extra-axial  collection or mass lesion/mass effect.   Vascular: See below.   Skull: No acute fracture.   Sinuses/Orbits: Clear sinuses.  No acute orbital findings.   Other: No mastoid effusions.   Review of the MIP images confirms the above findings   CTA NECK FINDINGS   Aortic arch: Aortic atherosclerosis. Great vessel origins are patent.   Right carotid system: Interval stenting of carotid bifurcation. Stent is patent without greater than 50% stenosis.   Left carotid system: Interval stenting of carotid bifurcation. Stent is partially collapsed proximally with suggestion of endoleak medially. There is resulting 40% proximal in stent stenosis. The external carotid artery origin is occluded versus severely stenotic.   Vertebral arteries: Mild atherosclerotic narrowing of bilateral vertebral artery origins. Otherwise, patent vertebral arteries without significant stenosis. Co dominant.   Skeleton: No evidence of acute abnormality on limited assessment.   Other neck: No acute abnormality on limited assessment.   Upper chest: Small left layering pleural effusion.   Review of the MIP images confirms the above findings   CTA HEAD FINDINGS   Anterior circulation: Bilateral intracranial ICAs, MCAs, and ACAs are patent without proximal high-grade stenosis. Calcific atherosclerosis of bilateral intracranial ICAs.   Posterior circulation: Bilateral intradural vertebral arteries, basilar artery and bilateral posterior cerebral arteries are patent without proximal hemodynamically significant stenosis. Right fetal type PCA, anatomic variant. Atherosclerotic irregularity of the PCAs bilaterally.   Venous sinuses: As permitted by contrast timing, patent.   Anatomic variants: Detailed above.   Review of the MIP images confirms the above findings   IMPRESSION: 1. Interval stenting of bilateral carotid bifurcations with improved stenosis. 2. On the left, stent is partially collapsed  proximally with suggestion of endoleak medially. There is resulting 40% proximal in stent stenosis. The external carotid artery origin is occluded versus severely stenotic. Catheter arteriogram could further characterize if clinically warranted. 3. On the right, stent is patent without significant stenosis. 4. Small layering left pleural effusion. 5.  Aortic Atherosclerosis (ICD10-I70.0).   Previously reviewed results: Lab Results  Component Value Date    HGBA1C (H) 12/06/2010      5.8 (NOTE)                                                                       According to the ADA Clinical Practice Recommendations for 2011, when HbA1c is used as a screening test:   >=6.5%   Diagnostic of Diabetes Mellitus           (if abnormal result  is confirmed)  5.7-6.4%   Increased risk of developing Diabetes Mellitus  References:Diagnosis and Classification of Diabetes Mellitus,Diabetes Care,2011,34(Suppl 1):S62-S69 and Standards of Medical Care in         Diabetes - 2011,Diabetes Care,2011,34  (Suppl 1):S11-S61.      Recent Labs  No results found for: "VITAMINB12"   Recent Labs       Lab Results  Component Value Date    TSH 4.040 11/28/2022      Recent Labs[] Expand by Default       Lab Results  Component  Value Date    ESRSEDRATE 30 (H) 01/29/2023      External labs: 02/18/23: CMP significant for Cr 1.45 Lipid panel: HDL 40, LDL 63, TG 101 CBC unremarkable HbA1c: 6.2   Imaging: External CT head wo contrast (02/18/23):    CT thoracic spine wo contrast (01/26/23): FINDINGS: Alignment: Normal.   Vertebrae: Diffuse idiopathic skeletal hyperostosis. Interval sclerosis of the now subacute fracture through the T9 vertebral body. No extension into the posterior elements. No retropulsion or significant height loss. No evidence of epidural hematoma.   Paraspinal and other soft tissues: Persistent paraspinal edema about the T9 level. Unchanged small left pleural effusion with  adjacent atelectasis in the left lung base.   Disc levels: No disc herniation, spinal canal stenosis or neural foraminal narrowing in the thoracic spine.   IMPRESSION: 1. Interval sclerosis of the now subacute fracture through the T9 vertebral body. No extension into the posterior elements. No retropulsion or significant height loss. 2. Unchanged small left pleural effusion with adjacent atelectasis in the left lung base.   CT head and cervical spine wo contrast (12/01/22): FINDINGS: CT HEAD FINDINGS   Brain: No evidence of acute infarction, hemorrhage, hydrocephalus, extra-axial collection or mass lesion/mass effect.   Vascular: No hyperdense vessel or unexpected calcification.   Skull: Normal. Negative for fracture or focal lesion.   Sinuses/Orbits: No middle ear or mastoid effusion. Paranasal sinuses are clear. Orbits are unremarkable.   Other: None.   CT CERVICAL SPINE FINDINGS   Alignment: Straightening of the normal cervical lordosis.   Skull base and vertebrae: No acute fracture. No primary bone lesion or focal pathologic process.   Soft tissues and spinal canal: Redemonstrated left common carotid artery stent with narrowing of the stent lumen in the midportion, unchanged from prior exam.   Disc levels:  No evidence of high-grade spinal canal stenosis.   Upper chest:  Biapical pleural-parenchymal scarring.   Other: None   IMPRESSION: 1. No acute intracranial abnormality. 2. No acute cervical spine fracture or traumatic listhesis. 3. Redemonstrated left common carotid artery stent with narrowing of the stent lumen in the midportion, unchanged from prior exam.   MRI lumbar spine wo contrast (10/18/17): FINDINGS: Segmentation:  Normal   Alignment:  Unchanged grade 1 retrolisthesis at L3-L4 and L4-L5.   Vertebrae:  No fracture, evidence of discitis, or bone lesion.   Conus medullaris and cauda equina: Conus extends to the L2 level. Conus and cauda equina  appear normal.   Paraspinal and other soft tissues: 3.3 cm abdominal aortic aneurysm.   Disc levels:   T11-T12: Minimal disc bulge with no stenosis.   T12-L1: Normal disc space and facets. No spinal canal or neuroforaminal stenosis.   L1-L2: Progression of small disc bulge without spinal canal or neural foraminal stenosis.   L2-L3: Disc space narrowing with small bulge.  No stenosis.   L3-L4: Disc space narrowing with central disc protrusion, unchanged. Mild facet hypertrophy. No spinal canal stenosis. Mild bilateral foraminal stenosis, unchanged.   L4-L5: Left eccentric disc bulge and small central protrusion. No central spinal canal stenosis. Moderate left foraminal stenosis, unchanged from prior.   L5-S1: Small central disc protrusion without central spinal canal stenosis. Severe right neural foraminal stenosis, unchanged. An extraforaminal component of the disc is in close proximity to the exiting right L5 nerve root in the extraforaminal zone. Left-greater-than-right facet hypertrophy.   Visualized sacrum: Normal.   IMPRESSION: 1. Unchanged appearance of the lumbar spine with severe right L5-S1 neural foraminal stenosis and  moderate left L4-5 foraminal stenosis. 2. No central spinal canal stenosis.  No acute findings. 3. 3.3 cm infrarenal abdominal aortic aneurysm. Recommend followup by ultrasound in 3 years. This recommendation follows ACR consensus guidelines: White Paper of the ACR Incidental Findings Committee II on Vascular Findings. Earlyne Iba Radiol 2013; 09:811-914   Echo (10/30/22): 1. Left ventricular ejection fraction, by estimation, is 45 to 50%. Left  ventricular ejection fraction by PLAX is 49 %. The left ventricle has  mildly decreased function. The left ventricle demonstrates regional wall  motion abnormalities (see scoring  diagram/findings for description). Left ventricular diastolic parameters  are consistent with Grade I diastolic dysfunction  (impaired relaxation).  There is moderate hypokinesis of the left ventricular, basal-mid inferior  wall and inferolateral wall.   2. Right ventricular systolic function is low normal. The right  ventricular size is mildly enlarged. There is normal pulmonary artery  systolic pressure. The estimated right ventricular systolic pressure is  34.4 mmHg.   3. The mitral valve is abnormal. Trivial mitral valve regurgitation.  Moderate to severe mitral annular calcification.   4. The aortic valve is tricuspid. There is moderate calcification of the  aortic valve. Aortic valve regurgitation is trivial. Moderate aortic valve  stenosis. Aortic valve area, by VTI measures 1.13 cm. Aortic valve mean  gradient measures 11.0 mmHg.  Aortic valve Vmax measures 2.18 m/s. Peak gradient 19 mmHg, DI 0.40. LVOT  diameter 1.9 cm.   5. Aortic dilatation noted. There is borderline dilatation of the aortic  root, measuring 38 mm. There is mild dilatation of the ascending aorta,  measuring 43 mm.   6. The inferior vena cava is dilated in size with <50% respiratory  variability, suggesting right atrial pressure of 15 mmHg.   Comparison(s): Changes from prior study are noted. 02/23/2022: LVEF 40-45%,  mild to moderate AS.   Assessment/Plan:  This is Bartolo Darter, a 89 y.o. male with: ***   Plan: *** -Continue eliquis 5 mg BID -Continue atorvastatin 80 mg daily  Return to clinic in ***  Total time spent reviewing records, interview, history/exam, documentation, and coordination of care on day of encounter:  *** min  Jacquelyne Balint, MD

## 2023-06-11 DIAGNOSIS — N1831 Chronic kidney disease, stage 3a: Secondary | ICD-10-CM | POA: Diagnosis not present

## 2023-06-11 DIAGNOSIS — I130 Hypertensive heart and chronic kidney disease with heart failure and stage 1 through stage 4 chronic kidney disease, or unspecified chronic kidney disease: Secondary | ICD-10-CM | POA: Diagnosis not present

## 2023-06-11 DIAGNOSIS — E1122 Type 2 diabetes mellitus with diabetic chronic kidney disease: Secondary | ICD-10-CM | POA: Diagnosis not present

## 2023-06-11 DIAGNOSIS — N401 Enlarged prostate with lower urinary tract symptoms: Secondary | ICD-10-CM | POA: Diagnosis not present

## 2023-06-11 DIAGNOSIS — E785 Hyperlipidemia, unspecified: Secondary | ICD-10-CM | POA: Diagnosis not present

## 2023-06-11 DIAGNOSIS — I5022 Chronic systolic (congestive) heart failure: Secondary | ICD-10-CM | POA: Diagnosis not present

## 2023-06-11 DIAGNOSIS — E039 Hypothyroidism, unspecified: Secondary | ICD-10-CM | POA: Diagnosis not present

## 2023-06-18 DIAGNOSIS — I700 Atherosclerosis of aorta: Secondary | ICD-10-CM | POA: Diagnosis not present

## 2023-06-18 DIAGNOSIS — Z0000 Encounter for general adult medical examination without abnormal findings: Secondary | ICD-10-CM | POA: Diagnosis not present

## 2023-06-18 DIAGNOSIS — I5022 Chronic systolic (congestive) heart failure: Secondary | ICD-10-CM | POA: Diagnosis not present

## 2023-06-18 DIAGNOSIS — I714 Abdominal aortic aneurysm, without rupture, unspecified: Secondary | ICD-10-CM | POA: Diagnosis not present

## 2023-06-18 DIAGNOSIS — E1122 Type 2 diabetes mellitus with diabetic chronic kidney disease: Secondary | ICD-10-CM | POA: Diagnosis not present

## 2023-06-18 DIAGNOSIS — I130 Hypertensive heart and chronic kidney disease with heart failure and stage 1 through stage 4 chronic kidney disease, or unspecified chronic kidney disease: Secondary | ICD-10-CM | POA: Diagnosis not present

## 2023-06-18 DIAGNOSIS — I480 Paroxysmal atrial fibrillation: Secondary | ICD-10-CM | POA: Diagnosis not present

## 2023-06-18 DIAGNOSIS — R82998 Other abnormal findings in urine: Secondary | ICD-10-CM | POA: Diagnosis not present

## 2023-06-18 DIAGNOSIS — I2510 Atherosclerotic heart disease of native coronary artery without angina pectoris: Secondary | ICD-10-CM | POA: Diagnosis not present

## 2023-06-18 DIAGNOSIS — Z1339 Encounter for screening examination for other mental health and behavioral disorders: Secondary | ICD-10-CM | POA: Diagnosis not present

## 2023-06-18 DIAGNOSIS — Z1331 Encounter for screening for depression: Secondary | ICD-10-CM | POA: Diagnosis not present

## 2023-06-18 DIAGNOSIS — N1831 Chronic kidney disease, stage 3a: Secondary | ICD-10-CM | POA: Diagnosis not present

## 2023-06-18 DIAGNOSIS — I10 Essential (primary) hypertension: Secondary | ICD-10-CM | POA: Diagnosis not present

## 2023-06-18 DIAGNOSIS — D6869 Other thrombophilia: Secondary | ICD-10-CM | POA: Diagnosis not present

## 2023-06-24 ENCOUNTER — Ambulatory Visit: Payer: Medicare Other | Admitting: Neurology

## 2023-07-02 DIAGNOSIS — I10 Essential (primary) hypertension: Secondary | ICD-10-CM | POA: Diagnosis not present

## 2023-07-17 ENCOUNTER — Other Ambulatory Visit (HOSPITAL_COMMUNITY): Payer: Self-pay

## 2023-07-20 ENCOUNTER — Telehealth: Payer: Self-pay

## 2023-07-20 NOTE — Telephone Encounter (Signed)
[employer] alert received for increase in AF burden with increased VR's.   Patient reports increased dizziness since going back in AF. Patient advised recommend AF clinic apt, pt agreeable to plan.

## 2023-07-27 ENCOUNTER — Other Ambulatory Visit (HOSPITAL_COMMUNITY): Payer: Self-pay

## 2023-07-28 ENCOUNTER — Ambulatory Visit (HOSPITAL_COMMUNITY): Payer: Medicare Other | Admitting: Physician Assistant

## 2023-07-28 ENCOUNTER — Telehealth (HOSPITAL_COMMUNITY): Payer: Self-pay | Admitting: Pharmacist

## 2023-07-28 NOTE — Telephone Encounter (Signed)
Patient provided AVS from [place] requesting review for possible change of apixaban (Eliquis) to edoxaban [facility]).   "Tremor -at our last visit we put him on some primidone. He is on 25 mg. It did help in his tremor. He is also interestingly using a weighted glove on his right hand to reduce the tremor. He has also bought heavier weight utensils that work for him. It would be nice to increase the primidone to 50 mg daily but there is an interaction with the apixaban. Primidone can decrease the effectiveness of the apixaban. I have asked him to speak to his cardiologist about edoxaban  in order that we can increase the primidone. He will do that and let us know. If so then we will have to do prior authorization."   As indicated by [place] provider above, Primidone is a strong CYP3A4 Inducer and decreases the effectiveness of Eliquis (and Xarelto). Discussed with Dr. Shirlee Latch, it is clinically appropriate to change to edoxaban. Patient's CrCL using [facility] is 43 mL/min, his dose of Edoxaban would be 30 mg (1 tablet) daily (based off labs from 05/26/23). Prior authorization was completed and approved. Cost of edoxaban (Savaysa) is $196. Will see if this is affordable for patient or if he can get it cheaper through the [place].   Was able to reach patient to discuss. He says the [place] provides his medications. He will get in touch with his PCP at the [place] who will prescribe the Edoxaban. He will let us know if we need to do anything else for him. Will update medication list once change in therapy is confirmed. Patient expressed understanding.   Karle Plumber, PharmD, BCPS, BCCP, CPP Heart Failure Clinic Pharmacist [phone]

## 2023-07-30 ENCOUNTER — Ambulatory Visit (HOSPITAL_COMMUNITY)
Admission: RE | Admit: 2023-07-30 | Discharge: 2023-07-30 | Disposition: A | Discharge status: Home / Self Care | Payer: Medicare Other | Source: Ambulatory Visit | Attending: Physician Assistant | Admitting: Physician Assistant

## 2023-07-30 ENCOUNTER — Encounter (HOSPITAL_COMMUNITY): Payer: Self-pay | Admitting: Physician Assistant

## 2023-07-30 VITALS — BP 120/78 | HR 89 | Ht 75.0 in | Wt 207.8 lb

## 2023-07-30 DIAGNOSIS — I110 Hypertensive heart disease with heart failure: Secondary | ICD-10-CM | POA: Diagnosis not present

## 2023-07-30 DIAGNOSIS — D6869 Other thrombophilia: Secondary | ICD-10-CM | POA: Diagnosis not present

## 2023-07-30 DIAGNOSIS — Z951 Presence of aortocoronary bypass graft: Secondary | ICD-10-CM | POA: Insufficient documentation

## 2023-07-30 DIAGNOSIS — I4819 Other persistent atrial fibrillation: Secondary | ICD-10-CM | POA: Insufficient documentation

## 2023-07-30 DIAGNOSIS — Z95810 Presence of automatic (implantable) cardiac defibrillator: Secondary | ICD-10-CM | POA: Diagnosis not present

## 2023-07-30 DIAGNOSIS — R55 Syncope and collapse: Secondary | ICD-10-CM | POA: Insufficient documentation

## 2023-07-30 DIAGNOSIS — Z7901 Long term (current) use of anticoagulants: Secondary | ICD-10-CM | POA: Diagnosis not present

## 2023-07-30 DIAGNOSIS — I2510 Atherosclerotic heart disease of native coronary artery without angina pectoris: Secondary | ICD-10-CM | POA: Insufficient documentation

## 2023-07-30 DIAGNOSIS — Z79899 Other long term (current) drug therapy: Secondary | ICD-10-CM | POA: Insufficient documentation

## 2023-07-30 DIAGNOSIS — I5022 Chronic systolic (congestive) heart failure: Secondary | ICD-10-CM | POA: Diagnosis not present

## 2023-07-30 LAB — BASIC METABOLIC PANEL
Anion gap: 10 (ref 5–15)
BUN: 46 mg/dL — ABNORMAL HIGH (ref 8–23)
CO2: 25 mmol/L (ref 22–32)
Calcium: 9.7 mg/dL (ref 8.9–10.3)
Chloride: 105 mmol/L (ref 98–111)
Creatinine, Ser: 1.98 mg/dL — ABNORMAL HIGH (ref 0.61–1.24)
GFR, Estimated: 32 mL/min — ABNORMAL LOW (ref >60)
Glucose, Bld: 99 mg/dL (ref 70–99)
Potassium: 4.2 mmol/L (ref 3.5–5.1)
Sodium: 140 mmol/L (ref 135–145)

## 2023-07-30 LAB — CBC
HCT: 43.5 % (ref 39.0–52.0)
Hemoglobin: 13.8 g/dL (ref 13.0–17.0)
MCH: 31.6 pg (ref 26.0–34.0)
MCHC: 31.7 g/dL (ref 30.0–36.0)
MCV: 99.5 fL (ref 80.0–100.0)
Platelets: 154 10*3/uL (ref 150–400)
RBC: 4.37 MIL/uL (ref 4.22–5.81)
RDW: 14.9 % (ref 11.5–15.5)
WBC: 7.7 10*3/uL (ref 4.0–10.5)
nRBC: 0 % (ref 0.0–0.2)

## 2023-07-30 NOTE — H&P (View-Only) (Signed)
 Primary Care Physician: Creola Corn, MD Primary Cardiologist: None Electrophysiologist: Lanier Prude, MD  Referring Physician: Device clinic    Eric Gordon is a 89 y.o. male with a history of chronic systolic HF, VT with Biotronik ICD, CAD, OSA, s/p CABG, atrial fibrillation who presents for follow up in the [facility]. Admitted 3/11 - 12/04/22 with fall, syncope, and T9 vertebral fracture. He had VT treated by device on 3/7 associated with syncopal episode. He was started on amiodarone. Seen in HF clinic 01/29/23 and complained of SOB and overall feeling terrible. Slight AKI noted. Seen in EP clinic 02/02/23 and taken off amidoarone, as was felt to be contributing to his SOB and malaise. He is on mexiletine.   Patient is on Eliquis for a CHADS2VASC score of 5.  On follow up today, the device clinic noted an increased afib burden on 07/17/23. He remains in afib today. The patient has noted increased SOB with exertion since that time. There were no specific triggers that he could identify.   Today, he denies symptoms of palpitations, chest pain, shortness of breath, orthopnea, PND, lower extremity edema, dizziness, presyncope, syncope, snoring, daytime somnolence, bleeding, or neurologic sequela. The patient is tolerating medications without difficulties and is otherwise without complaint today.    Atrial Fibrillation Risk Factors:  he does have symptoms or diagnosis of sleep apnea. he does not have a history of rheumatic fever.   Atrial Fibrillation Management history:  Previous antiarrhythmic drugs: amiodarone (lung toxicity?), mexiletine  Previous cardioversions: none Previous ablations: none Anticoagulation history: Eliquis (considering edoxaban)  ROS- All systems are reviewed and negative except as per the HPI above.  Past Medical History:  Diagnosis Date   Abdominal aortic aneurysm (AAA) (HCC)    Achilles tendinitis of right lower  extremity    AICD (automatic cardioverter/defibrillator) present    Arthritis    "my whole body" (03/19/2018)   Atherosclerosis of coronary artery bypass graft w/o angina pectoris    Atrial fibrillation (HCC)    Barrett's esophagus    CAD (coronary artery disease)    Carotid artery disease (HCC)    CHF (congestive heart failure) (HCC)    Chronic anticoagulation    PE   Chronic lower back pain    Dyspnea    Dysrhythmia    Essential tremor    GERD (gastroesophageal reflux disease)    High cholesterol    Hyperlipidemia    Hyperplasia, prostate    Hypertension    Hypothyroidism    Myocardial infarction (HCC)    2 in Jan. 2019   Nail dystrophy    OSA on CPAP    uses CPAP   Osteoarthrosis    Pneumonia    "now and once before" (03/19/2018)   Pulmonary embolism (HCC)    April 2012 after CABG   S/P CABG (coronary artery bypass graft)    Sleep apnea    Spontaneous pneumothorax    left spontaneous pneumothorax/left pleural effusion, s/p chest tube 01/09/19; thoracentesis x2, s/p left VATS/tacl pleurodesis 04/12/19    Current Outpatient Medications  Medication Sig Dispense Refill   apixaban (ELIQUIS) 5 MG TABS tablet Take 1 tablet (5 mg total) by mouth 2 (two) times daily. 60 tablet    atorvastatin (LIPITOR) 80 MG tablet Take 1 tablet (80 mg total) by mouth daily. 30 tablet 6   cetirizine (ZYRTEC) 10 MG tablet Take 10 mg by mouth daily.     Cholecalciferol (VITAMIN D-3) 25 MCG (1000 UT)  CAPS Take 1,000 Units by mouth in the morning.     Cyanocobalamin (VITAMIN B 12 PO) Take 1 tablet by mouth daily.     finasteride (PROSCAR) 5 MG tablet Take 5 mg by mouth daily.     furosemide (LASIX) 20 MG tablet Take 2 tablets (40 mg total) by mouth daily. 60 tablet 3   isosorbide mononitrate (IMDUR) 30 MG 24 hr tablet Take 0.5 tablets (15 mg total) by mouth daily.     levothyroxine (SYNTHROID) 50 MCG tablet Take 50 mcg by mouth at bedtime.     metoprolol succinate (TOPROL XL) 25 MG 24 hr tablet  Take 1 tablet (25 mg total) by mouth daily.     mexiletine (MEXITIL) 150 MG capsule Take 150 mg by mouth 2 (two) times daily.     midodrine (PROAMATINE) 2.5 MG tablet Take 1 tablet (2.5 mg total) by mouth 2 (two) times daily with a meal. 180 tablet 3   Multiple Vitamin (MULTIVITAMIN WITH MINERALS) TABS tablet Take 1 tablet by mouth in the morning.     pantoprazole (PROTONIX) 40 MG tablet Take 40 mg by mouth daily before breakfast.     potassium chloride (KLOR-CON M) 10 MEQ tablet Take 1 tablet (10 mEq total) by mouth daily. 30 tablet 3   spironolactone (ALDACTONE) 25 MG tablet Take 0.5 tablets (12.5 mg total) by mouth at bedtime. 15 tablet 2   No current facility-administered medications for this encounter.    Physical Exam: BP 120/78   Pulse 89   Ht 6\' 3"  (1.905 m)   Wt 94.3 kg   SpO2 98%   BMI 25.97 kg/m   GEN: Well nourished, well developed in no acute distress NECK: No JVD; No carotid bruits CARDIAC: Irregularly irregular rate and rhythm, no murmurs, rubs, gallops RESPIRATORY:  Clear to auscultation without rales, wheezing or rhonchi  ABDOMEN: Soft, non-tender, non-distended EXTREMITIES:  No edema; No deformity   Wt Readings from Last 3 Encounters:  07/30/23 94.3 kg  05/26/23 94.3 kg  05/11/23 93.9 kg     EKG today demonstrates  Afib, RBBB, LAFB Vent. rate 89 BPM PR interval * ms QRS duration 162 ms QT/QTcB 418/508 ms   Echo 10/30/22 demonstrated   1. Left ventricular ejection fraction, by estimation, is 45 to 50%. Left  ventricular ejection fraction by PLAX is 49 %. The left ventricle has  mildly decreased function. The left ventricle demonstrates regional wall  motion abnormalities (see scoring diagram/findings for description). Left ventricular diastolic parameters are consistent with Grade I diastolic dysfunction (impaired relaxation). There is moderate hypokinesis of the left ventricular, basal-mid inferior wall and inferolateral wall.   2. Right ventricular  systolic function is low normal. The right  ventricular size is mildly enlarged. There is normal pulmonary artery  systolic pressure. The estimated right ventricular systolic pressure is  34.4 mmHg.   3. The mitral valve is abnormal. Trivial mitral valve regurgitation.  Moderate to severe mitral annular calcification.   4. The aortic valve is tricuspid. There is moderate calcification of the  aortic valve. Aortic valve regurgitation is trivial. Moderate aortic valve  stenosis. Aortic valve area, by VTI measures 1.13 cm. Aortic valve mean  gradient measures 11.0 mmHg. Aortic valve Vmax measures 2.18 m/s. Peak gradient 19 mmHg, DI 0.40. LVOT diameter 1.9 cm.   5. Aortic dilatation noted. There is borderline dilatation of the aortic  root, measuring 38 mm. There is mild dilatation of the ascending aorta,  measuring 43 mm.   6.  The inferior vena cava is dilated in size with <50% respiratory  variability, suggesting right atrial pressure of 15 mmHg.   Comparison(s): Changes from prior study are noted. 02/23/2022: LVEF 40-45%,  mild to moderate AS.    CHA2DS2-VASc Score = 5  The patient's score is based upon: CHF History: 1 HTN History: 1 Diabetes History: 0 Stroke History: 0 Vascular Disease History: 1 Age Score: 2 Gender Score: 0       ASSESSMENT AND PLAN: Persistent Atrial Fibrillation (ICD10:  I48.19) The patient's CHA2DS2-VASc score is 5, indicating a 7.2% annual risk of stroke.   Previously not tolerated amiodarone.  Patient remains in symptomatic afib. We discussed rhythm control options today. After discussing the risks and benefits, will plan for DCCV. He plans to stay on Eliquis for now until at least 4 weeks post DCCV.  Continue Eliquis 5 mg BID Continue Toprol 25 mg daily Not felt to be a candidate for sotalol.   Secondary Hypercoagulable State (ICD10:  D68.69) The patient is at significant risk for stroke/thromboembolism based upon his CHA2DS2-VASc Score of 5.   Continue Apixaban (Eliquis).   CAD S/p CABG On statin No anginal symptoms  HTN Stable on current regimen  Chronic systolic CHF ICM S/p ICD Followed in the [facility] Fluid status appears stable today  VT/syncope S/p ICD On mexiletine  Followed by Dr Lalla Brothers    Follow up in the AF clinic post DCCV.   Informed Consent   Shared Decision Making/Informed Consent The risks (stroke, cardiac arrhythmias rarely resulting in the need for a temporary or permanent pacemaker, skin irritation or burns and complications associated with conscious sedation including aspiration, arrhythmia, respiratory failure and death), benefits (restoration of normal sinus rhythm) and alternatives of a direct current cardioversion were explained in detail to Mr. Bonura and he agrees to proceed.         Jorja Loa PA-C Afib Clinic [facility] [address] [place], [place] [address] [phone]

## 2023-07-30 NOTE — Patient Instructions (Addendum)
Cardioversion scheduled for:  Monday, November 11th   - Arrive at the [facility] and go to admitting at [facility] not eat or drink anything after midnight the night prior to your procedure.   - Take all your morning medication (except diabetic medications) with a sip of water prior to arrival.  - You will not be able to drive home after your procedure.    - Do NOT miss any doses of your blood thinner - if you should miss a dose please notify our office immediately.   - If you feel as if you go back into normal rhythm prior to scheduled cardioversion, please notify our office immediately.   If your procedure is canceled in the cardioversion suite you will be charged a cancellation fee.

## 2023-07-30 NOTE — Progress Notes (Signed)
Primary Care Physician: Creola Corn, MD Primary Cardiologist: None Electrophysiologist: Lanier Prude, MD  Referring Physician: Device clinic    Eric Gordon is a 89 y.o. male with a history of chronic systolic HF, VT with Biotronik ICD, CAD, OSA, s/p CABG, atrial fibrillation who presents for follow up in the [facility]. Admitted 3/11 - 12/04/22 with fall, syncope, and T9 vertebral fracture. He had VT treated by device on 3/7 associated with syncopal episode. He was started on amiodarone. Seen in HF clinic 01/29/23 and complained of SOB and overall feeling terrible. Slight AKI noted. Seen in EP clinic 02/02/23 and taken off amidoarone, as was felt to be contributing to his SOB and malaise. He is on mexiletine.   Patient is on Eliquis for a CHADS2VASC score of 5.  On follow up today, the device clinic noted an increased afib burden on 07/17/23. He remains in afib today. The patient has noted increased SOB with exertion since that time. There were no specific triggers that he could identify.   Today, he denies symptoms of palpitations, chest pain, shortness of breath, orthopnea, PND, lower extremity edema, dizziness, presyncope, syncope, snoring, daytime somnolence, bleeding, or neurologic sequela. The patient is tolerating medications without difficulties and is otherwise without complaint today.    Atrial Fibrillation Risk Factors:  he does have symptoms or diagnosis of sleep apnea. he does not have a history of rheumatic fever.   Atrial Fibrillation Management history:  Previous antiarrhythmic drugs: amiodarone (lung toxicity?), mexiletine  Previous cardioversions: none Previous ablations: none Anticoagulation history: Eliquis (considering edoxaban)  ROS- All systems are reviewed and negative except as per the HPI above.  Past Medical History:  Diagnosis Date   Abdominal aortic aneurysm (AAA) (HCC)    Achilles tendinitis of right lower  extremity    AICD (automatic cardioverter/defibrillator) present    Arthritis    "my whole body" (03/19/2018)   Atherosclerosis of coronary artery bypass graft w/o angina pectoris    Atrial fibrillation (HCC)    Barrett's esophagus    CAD (coronary artery disease)    Carotid artery disease (HCC)    CHF (congestive heart failure) (HCC)    Chronic anticoagulation    PE   Chronic lower back pain    Dyspnea    Dysrhythmia    Essential tremor    GERD (gastroesophageal reflux disease)    High cholesterol    Hyperlipidemia    Hyperplasia, prostate    Hypertension    Hypothyroidism    Myocardial infarction (HCC)    2 in Jan. 2019   Nail dystrophy    OSA on CPAP    uses CPAP   Osteoarthrosis    Pneumonia    "now and once before" (03/19/2018)   Pulmonary embolism (HCC)    April 2012 after CABG   S/P CABG (coronary artery bypass graft)    Sleep apnea    Spontaneous pneumothorax    left spontaneous pneumothorax/left pleural effusion, s/p chest tube 01/09/19; thoracentesis x2, s/p left VATS/tacl pleurodesis 04/12/19    Current Outpatient Medications  Medication Sig Dispense Refill   apixaban (ELIQUIS) 5 MG TABS tablet Take 1 tablet (5 mg total) by mouth 2 (two) times daily. 60 tablet    atorvastatin (LIPITOR) 80 MG tablet Take 1 tablet (80 mg total) by mouth daily. 30 tablet 6   cetirizine (ZYRTEC) 10 MG tablet Take 10 mg by mouth daily.     Cholecalciferol (VITAMIN D-3) 25 MCG (1000 UT)  CAPS Take 1,000 Units by mouth in the morning.     Cyanocobalamin (VITAMIN B 12 PO) Take 1 tablet by mouth daily.     finasteride (PROSCAR) 5 MG tablet Take 5 mg by mouth daily.     furosemide (LASIX) 20 MG tablet Take 2 tablets (40 mg total) by mouth daily. 60 tablet 3   isosorbide mononitrate (IMDUR) 30 MG 24 hr tablet Take 0.5 tablets (15 mg total) by mouth daily.     levothyroxine (SYNTHROID) 50 MCG tablet Take 50 mcg by mouth at bedtime.     metoprolol succinate (TOPROL XL) 25 MG 24 hr tablet  Take 1 tablet (25 mg total) by mouth daily.     mexiletine (MEXITIL) 150 MG capsule Take 150 mg by mouth 2 (two) times daily.     midodrine (PROAMATINE) 2.5 MG tablet Take 1 tablet (2.5 mg total) by mouth 2 (two) times daily with a meal. 180 tablet 3   Multiple Vitamin (MULTIVITAMIN WITH MINERALS) TABS tablet Take 1 tablet by mouth in the morning.     pantoprazole (PROTONIX) 40 MG tablet Take 40 mg by mouth daily before breakfast.     potassium chloride (KLOR-CON M) 10 MEQ tablet Take 1 tablet (10 mEq total) by mouth daily. 30 tablet 3   spironolactone (ALDACTONE) 25 MG tablet Take 0.5 tablets (12.5 mg total) by mouth at bedtime. 15 tablet 2   No current facility-administered medications for this encounter.    Physical Exam: BP 120/78   Pulse 89   Ht 6\' 3"  (1.905 m)   Wt 94.3 kg   SpO2 98%   BMI 25.97 kg/m   GEN: Well nourished, well developed in no acute distress NECK: No JVD; No carotid bruits CARDIAC: Irregularly irregular rate and rhythm, no murmurs, rubs, gallops RESPIRATORY:  Clear to auscultation without rales, wheezing or rhonchi  ABDOMEN: Soft, non-tender, non-distended EXTREMITIES:  No edema; No deformity   Wt Readings from Last 3 Encounters:  07/30/23 94.3 kg  05/26/23 94.3 kg  05/11/23 93.9 kg     EKG today demonstrates  Afib, RBBB, LAFB Vent. rate 89 BPM PR interval * ms QRS duration 162 ms QT/QTcB 418/508 ms   Echo 10/30/22 demonstrated   1. Left ventricular ejection fraction, by estimation, is 45 to 50%. Left  ventricular ejection fraction by PLAX is 49 %. The left ventricle has  mildly decreased function. The left ventricle demonstrates regional wall  motion abnormalities (see scoring diagram/findings for description). Left ventricular diastolic parameters are consistent with Grade I diastolic dysfunction (impaired relaxation). There is moderate hypokinesis of the left ventricular, basal-mid inferior wall and inferolateral wall.   2. Right ventricular  systolic function is low normal. The right  ventricular size is mildly enlarged. There is normal pulmonary artery  systolic pressure. The estimated right ventricular systolic pressure is  34.4 mmHg.   3. The mitral valve is abnormal. Trivial mitral valve regurgitation.  Moderate to severe mitral annular calcification.   4. The aortic valve is tricuspid. There is moderate calcification of the  aortic valve. Aortic valve regurgitation is trivial. Moderate aortic valve  stenosis. Aortic valve area, by VTI measures 1.13 cm. Aortic valve mean  gradient measures 11.0 mmHg. Aortic valve Vmax measures 2.18 m/s. Peak gradient 19 mmHg, DI 0.40. LVOT diameter 1.9 cm.   5. Aortic dilatation noted. There is borderline dilatation of the aortic  root, measuring 38 mm. There is mild dilatation of the ascending aorta,  measuring 43 mm.   6.  The inferior vena cava is dilated in size with <50% respiratory  variability, suggesting right atrial pressure of 15 mmHg.   Comparison(s): Changes from prior study are noted. 02/23/2022: LVEF 40-45%,  mild to moderate AS.    CHA2DS2-VASc Score = 5  The patient's score is based upon: CHF History: 1 HTN History: 1 Diabetes History: 0 Stroke History: 0 Vascular Disease History: 1 Age Score: 2 Gender Score: 0       ASSESSMENT AND PLAN: Persistent Atrial Fibrillation (ICD10:  I48.19) The patient's CHA2DS2-VASc score is 5, indicating a 7.2% annual risk of stroke.   Previously not tolerated amiodarone.  Patient remains in symptomatic afib. We discussed rhythm control options today. After discussing the risks and benefits, will plan for DCCV. He plans to stay on Eliquis for now until at least 4 weeks post DCCV.  Continue Eliquis 5 mg BID Continue Toprol 25 mg daily Not felt to be a candidate for sotalol.   Secondary Hypercoagulable State (ICD10:  D68.69) The patient is at significant risk for stroke/thromboembolism based upon his CHA2DS2-VASc Score of 5.   Continue Apixaban (Eliquis).   CAD S/p CABG On statin No anginal symptoms  HTN Stable on current regimen  Chronic systolic CHF ICM S/p ICD Followed in the [facility] Fluid status appears stable today  VT/syncope S/p ICD On mexiletine  Followed by Dr Lalla Brothers    Follow up in the AF clinic post DCCV.   Informed Consent   Shared Decision Making/Informed Consent The risks (stroke, cardiac arrhythmias rarely resulting in the need for a temporary or permanent pacemaker, skin irritation or burns and complications associated with conscious sedation including aspiration, arrhythmia, respiratory failure and death), benefits (restoration of normal sinus rhythm) and alternatives of a direct current cardioversion were explained in detail to Mr. Bonura and he agrees to proceed.         Jorja Loa PA-C Afib Clinic [facility] [address] [place], [place] [address] [phone]

## 2023-07-31 NOTE — Progress Notes (Signed)
Unable to reach patient about procedure, but was able to leave a detailed message. Stated that the patient needed to arrive at the hospital at 0730 , remain NPO after 0000, needs to have a ride home and a responsible adult to stay with them for 24 hours after the procedure. Instructed the patient to call back if they had any questions.

## 2023-08-03 ENCOUNTER — Encounter (HOSPITAL_COMMUNITY)
Admission: RE | Disposition: A | Discharge status: Home / Self Care | Payer: Self-pay | Source: Home / Self Care | Attending: Cardiology

## 2023-08-03 ENCOUNTER — Ambulatory Visit (HOSPITAL_COMMUNITY): Payer: Medicare Other | Admitting: Certified Registered"

## 2023-08-03 ENCOUNTER — Other Ambulatory Visit: Payer: Self-pay

## 2023-08-03 ENCOUNTER — Ambulatory Visit (HOSPITAL_COMMUNITY)
Admission: RE | Admit: 2023-08-03 | Discharge: 2023-08-03 | Disposition: A | Discharge status: Home / Self Care | Payer: Medicare Other | Attending: Cardiology | Admitting: Cardiology

## 2023-08-03 DIAGNOSIS — D6869 Other thrombophilia: Secondary | ICD-10-CM | POA: Diagnosis not present

## 2023-08-03 DIAGNOSIS — Z95810 Presence of automatic (implantable) cardiac defibrillator: Secondary | ICD-10-CM | POA: Insufficient documentation

## 2023-08-03 DIAGNOSIS — R55 Syncope and collapse: Secondary | ICD-10-CM | POA: Insufficient documentation

## 2023-08-03 DIAGNOSIS — Z79899 Other long term (current) drug therapy: Secondary | ICD-10-CM | POA: Insufficient documentation

## 2023-08-03 DIAGNOSIS — Z951 Presence of aortocoronary bypass graft: Secondary | ICD-10-CM | POA: Diagnosis not present

## 2023-08-03 DIAGNOSIS — I739 Peripheral vascular disease, unspecified: Secondary | ICD-10-CM | POA: Diagnosis not present

## 2023-08-03 DIAGNOSIS — I5022 Chronic systolic (congestive) heart failure: Secondary | ICD-10-CM | POA: Insufficient documentation

## 2023-08-03 DIAGNOSIS — G4733 Obstructive sleep apnea (adult) (pediatric): Secondary | ICD-10-CM | POA: Diagnosis not present

## 2023-08-03 DIAGNOSIS — I2510 Atherosclerotic heart disease of native coronary artery without angina pectoris: Secondary | ICD-10-CM | POA: Insufficient documentation

## 2023-08-03 DIAGNOSIS — I4819 Other persistent atrial fibrillation: Secondary | ICD-10-CM | POA: Diagnosis not present

## 2023-08-03 DIAGNOSIS — N183 Chronic kidney disease, stage 3 unspecified: Secondary | ICD-10-CM | POA: Diagnosis not present

## 2023-08-03 DIAGNOSIS — I255 Ischemic cardiomyopathy: Secondary | ICD-10-CM | POA: Insufficient documentation

## 2023-08-03 DIAGNOSIS — I130 Hypertensive heart and chronic kidney disease with heart failure and stage 1 through stage 4 chronic kidney disease, or unspecified chronic kidney disease: Secondary | ICD-10-CM | POA: Diagnosis not present

## 2023-08-03 DIAGNOSIS — I472 Ventricular tachycardia, unspecified: Secondary | ICD-10-CM | POA: Insufficient documentation

## 2023-08-03 DIAGNOSIS — I4891 Unspecified atrial fibrillation: Secondary | ICD-10-CM

## 2023-08-03 DIAGNOSIS — I110 Hypertensive heart disease with heart failure: Secondary | ICD-10-CM | POA: Insufficient documentation

## 2023-08-03 DIAGNOSIS — Z7901 Long term (current) use of anticoagulants: Secondary | ICD-10-CM | POA: Insufficient documentation

## 2023-08-03 DIAGNOSIS — I509 Heart failure, unspecified: Secondary | ICD-10-CM | POA: Diagnosis not present

## 2023-08-03 HISTORY — PX: CARDIOVERSION: EP1203

## 2023-08-03 SURGERY — CARDIOVERSION (CATH LAB)
Anesthesia: General

## 2023-08-03 MED ORDER — SODIUM CHLORIDE 0.9% FLUSH: 10.0000 mL | Freq: Two times a day (BID) | INTRAVENOUS | Status: DC

## 2023-08-03 MED ORDER — PROPOFOL 10 MG/ML IV BOLUS
INTRAVENOUS | Status: DC | PRN
  Administered 2023-08-03: 60 mg via INTRAVENOUS

## 2023-08-03 MED ORDER — LIDOCAINE 2% (20 MG/ML) 5 ML SYRINGE
INTRAMUSCULAR | Status: DC | PRN
  Administered 2023-08-03: 40 mg via INTRAVENOUS

## 2023-08-03 SURGICAL SUPPLY — 1 items: PAD DEFIB RADIO PHYSIO CONN (PAD) ×2 IMPLANT

## 2023-08-03 NOTE — Transfer of Care (Signed)
Immediate Anesthesia Transfer of Care Note  Patient: Eric Gordon  Procedure(s) Performed: CARDIOVERSION (CATH LAB)  Patient Location: PACU and Cath Lab  Anesthesia Type:General  Level of Consciousness: drowsy, patient cooperative, and responds to stimulation  Airway & Oxygen Therapy: Patient Spontanous Breathing and Patient connected to face mask oxygen  Post-op Assessment: Report given to RN and Post -op Vital signs reviewed and stable  Post vital signs: Reviewed and stable  Last Vitals:  Vitals Value Taken Time  BP    Temp    Pulse    Resp    SpO2      Last Pain:  Vitals:   08/03/23 0735  TempSrc:   PainSc: 0-No pain         Complications: No notable events documented.

## 2023-08-03 NOTE — Interval H&P Note (Signed)
History and Physical Interval Note:  08/03/2023 8:58 AM  Eric Gordon  has presented today for surgery, with the diagnosis of AFIB.  The various methods of treatment have been discussed with the patient and family. After consideration of risks, benefits and other options for treatment, the patient has consented to  Procedure(s): CARDIOVERSION (CATH LAB) (N/A) as a surgical intervention.  The patient's history has been reviewed, patient examined, no change in status, stable for surgery.  I have reviewed the patient's chart and labs.  Questions were answered to the patient's satisfaction.     Sharry Beining [employer]

## 2023-08-03 NOTE — Procedures (Signed)
Electrical Cardioversion Procedure Note JAKUB TAPP [id number] 09/24/1930  Procedure: Electrical Cardioversion Indications:  Atrial Fibrillation  Procedure Details Consent: Risks of procedure as well as the alternatives and risks of each were explained to the (patient/caregiver).  Consent for procedure obtained. Time Out: Verified patient identification, verified procedure, site/side was marked, verified correct patient position, special equipment/implants available, medications/allergies/relevent history reviewed, required imaging and test results available.  Performed  Patient placed on cardiac monitor, pulse oximetry, supplemental oxygen as necessary.  Sedation given:  Propofol per anesthesiology Pacer pads placed anterior and posterior chest.  Cardioverted 1 time(s).  Cardioverted at 200J.  Evaluation Findings: Post procedure EKG shows: NSR Complications: None Patient did tolerate procedure well.   Marca Ancona 08/03/2023, 9:12 AM

## 2023-08-03 NOTE — Anesthesia Preprocedure Evaluation (Addendum)
Anesthesia Evaluation  Patient identified by MRN, date of birth, ID band Patient awake    Reviewed: Allergy & Precautions, H&P , NPO status , Patient's Chart, lab work & pertinent test results  History of Anesthesia Complications Negative for: history of anesthetic complications  Airway Mallampati: II  TM Distance: >3 FB Neck ROM: Full    Dental no notable dental hx.    Pulmonary asthma , sleep apnea and Continuous Positive Airway Pressure Ventilation , former smoker, PE  Hx spontaneous PTX    Pulmonary exam normal breath sounds clear to auscultation       Cardiovascular hypertension, Pt. on medications + CAD, + Past MI, + CABG, + Peripheral Vascular Disease and +CHF  + Cardiac Defibrillator + Valvular Problems/Murmurs AS  Rhythm:Irregular Rate:Normal   B/l carotid stents  '24 TTE - EF 45 to 50%. Grade I diastolic dysfunction (impaired relaxation). There is moderate hypokinesis of the left ventricular, basal-mid inferior wall and inferolateral wall. The right ventricular size is mildly enlarged. There is normal pulmonary artery  systolic pressure. Trivial mitral valve regurgitation. Aortic valve regurgitation is trivial. Moderate aortic valve stenosis. Aortic valve area, by VTI measures 1.13 cm. Aortic valve mean gradient measures 11.0 mmHg. Aortic valve Vmax measures 2.18 m/s. Peak gradient 19 mmHg, DI 0.40. LVOT diameter 1.9 cm. There is borderline dilatation of the aortic root, measuring 38 mm. There is mild dilatation of the ascending aorta, measuring 43 mm.     Neuro/Psych negative neurological ROS  negative psych ROS   GI/Hepatic Neg liver ROS,GERD  Medicated,,  Endo/Other  Hypothyroidism    Renal/GU CRFRenal disease     Musculoskeletal  (+) Arthritis ,    Abdominal Normal abdominal exam  (+)   Peds  Hematology  On eliquis    Anesthesia Other Findings   Reproductive/Obstetrics                              Anesthesia Physical Anesthesia Plan  ASA: 3  Anesthesia Plan: General   Post-op Pain Management: Minimal or no pain anticipated   Induction: Intravenous  PONV Risk Score and Plan: 2 and Treatment may vary due to age or medical condition and Propofol infusion  Airway Management Planned: Natural Airway and Mask  Additional Equipment: None  Intra-op Plan:   Post-operative Plan:   Informed Consent: I have reviewed the patients History and Physical, chart, labs and discussed the procedure including the risks, benefits and alternatives for the proposed anesthesia with the patient or authorized representative who has indicated his/her understanding and acceptance.     Dental advisory given  Plan Discussed with: CRNA and Anesthesiologist  Anesthesia Plan Comments:         Anesthesia Quick Evaluation

## 2023-08-03 NOTE — Discharge Instructions (Signed)

## 2023-08-03 NOTE — Anesthesia Postprocedure Evaluation (Signed)
Anesthesia Post Note  Patient: Bartolo Darter  Procedure(s) Performed: CARDIOVERSION (CATH LAB)     Patient location during evaluation: PACU Anesthesia Type: General Level of consciousness: awake and alert Pain management: pain level controlled Vital Signs Assessment: post-procedure vital signs reviewed and stable Respiratory status: spontaneous breathing, nonlabored ventilation, respiratory function stable and patient connected to nasal cannula oxygen Cardiovascular status: blood pressure returned to baseline and stable Postop Assessment: no apparent nausea or vomiting Anesthetic complications: no   No notable events documented.  Last Vitals:  Vitals:   08/03/23 0920 08/03/23 0930  BP: (!) 103/52 (!) 111/44  Pulse: 70 70  Resp: 20 18  Temp:    SpO2: 98% 97%    Last Pain:  Vitals:   08/03/23 0930  TempSrc:   PainSc: 0-No pain                 Earl Lites P Stanly Si

## 2023-08-04 ENCOUNTER — Encounter (HOSPITAL_COMMUNITY): Payer: Self-pay | Admitting: Cardiology

## 2023-08-14 DIAGNOSIS — L03115 Cellulitis of right lower limb: Secondary | ICD-10-CM | POA: Diagnosis not present

## 2023-08-14 DIAGNOSIS — I130 Hypertensive heart and chronic kidney disease with heart failure and stage 1 through stage 4 chronic kidney disease, or unspecified chronic kidney disease: Secondary | ICD-10-CM | POA: Diagnosis not present

## 2023-08-14 DIAGNOSIS — E1122 Type 2 diabetes mellitus with diabetic chronic kidney disease: Secondary | ICD-10-CM | POA: Diagnosis not present

## 2023-08-14 DIAGNOSIS — I739 Peripheral vascular disease, unspecified: Secondary | ICD-10-CM | POA: Diagnosis not present

## 2023-08-17 ENCOUNTER — Encounter (HOSPITAL_COMMUNITY): Payer: Self-pay | Admitting: Physician Assistant

## 2023-08-17 ENCOUNTER — Ambulatory Visit (HOSPITAL_COMMUNITY)
Admission: RE | Admit: 2023-08-17 | Discharge: 2023-08-17 | Disposition: A | Discharge status: Home / Self Care | Payer: Medicare Other | Source: Ambulatory Visit | Attending: Physician Assistant | Admitting: Physician Assistant

## 2023-08-17 VITALS — BP 138/80 | HR 79 | Ht 75.0 in | Wt 208.0 lb

## 2023-08-17 DIAGNOSIS — G4733 Obstructive sleep apnea (adult) (pediatric): Secondary | ICD-10-CM | POA: Insufficient documentation

## 2023-08-17 DIAGNOSIS — R55 Syncope and collapse: Secondary | ICD-10-CM | POA: Diagnosis not present

## 2023-08-17 DIAGNOSIS — Z79899 Other long term (current) drug therapy: Secondary | ICD-10-CM | POA: Diagnosis not present

## 2023-08-17 DIAGNOSIS — Z95810 Presence of automatic (implantable) cardiac defibrillator: Secondary | ICD-10-CM | POA: Diagnosis not present

## 2023-08-17 DIAGNOSIS — Z7901 Long term (current) use of anticoagulants: Secondary | ICD-10-CM | POA: Diagnosis not present

## 2023-08-17 DIAGNOSIS — I110 Hypertensive heart disease with heart failure: Secondary | ICD-10-CM | POA: Diagnosis not present

## 2023-08-17 DIAGNOSIS — I4819 Other persistent atrial fibrillation: Secondary | ICD-10-CM | POA: Diagnosis not present

## 2023-08-17 DIAGNOSIS — I2510 Atherosclerotic heart disease of native coronary artery without angina pectoris: Secondary | ICD-10-CM | POA: Insufficient documentation

## 2023-08-17 DIAGNOSIS — D6869 Other thrombophilia: Secondary | ICD-10-CM | POA: Insufficient documentation

## 2023-08-17 DIAGNOSIS — I5022 Chronic systolic (congestive) heart failure: Secondary | ICD-10-CM | POA: Diagnosis not present

## 2023-08-17 NOTE — Progress Notes (Signed)
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 male with a history of chronic systolic HF, VT with Biotronik ICD, CAD, OSA, s/p CABG, atrial fibrillation who presents for follow up in [facility]. Admitted 3/11 - 12/04/22 with fall, syncope, and T9 vertebral fracture. He had VT treated by device on 3/7 associated with syncopal episode. He was started on amiodarone. Seen in HF clinic 01/29/23 and complained of SOB and overall feeling terrible. Slight AKI noted. Seen in EP clinic 02/02/23 and taken off amidoarone, as was felt to be contributing to his SOB and malaise. He is on mexiletine.   Patient is on Eliquis for a CHADS2VASC score of 5.  The device clinic noted an increased afib burden on 07/17/23. He is s/p DCCV on 08/03/23.  On follow up today, patient reports that he feels stronger in SR. He denies any bleeding issues on anticoagulation. He did start primidone.   Today, he denies symptoms of palpitations, chest pain, shortness of breath, orthopnea, PND, lower extremity edema, dizziness, presyncope, syncope, snoring, daytime somnolence, bleeding, or neurologic sequela. The patient is tolerating medications without difficulties and is otherwise without complaint today.    Atrial Fibrillation Risk Factors:  he does have symptoms or diagnosis of sleep apnea. he does not have a history of rheumatic fever.   Atrial Fibrillation Management history:  Previous antiarrhythmic drugs: amiodarone (lung toxicity?), mexiletine  Previous cardioversions: 08/03/23 Previous ablations: none Anticoagulation history: Eliquis (considering edoxaban)  ROS- All systems are reviewed and negative except as per the HPI above.  Past Medical History:  Diagnosis Date   Abdominal aortic aneurysm (AAA) (HCC)    Achilles  tendinitis of right lower extremity    AICD (automatic cardioverter/defibrillator) present    Arthritis    "my whole body" (03/19/2018)   Atherosclerosis of coronary artery bypass graft w/o angina pectoris    Atrial fibrillation (HCC)    Barrett's esophagus    CAD (coronary artery disease)    Carotid artery disease (HCC)    CHF (congestive heart failure) (HCC)    Chronic anticoagulation    PE   Chronic lower back pain    Dyspnea    Dysrhythmia    Essential tremor    GERD (gastroesophageal reflux disease)    High cholesterol    Hyperlipidemia    Hyperplasia, prostate    Hypertension    Hypothyroidism    Myocardial infarction (HCC)    2 in Jan. 2019   Nail dystrophy    OSA on CPAP    uses CPAP   Osteoarthrosis    Pneumonia    "now and once before" (03/19/2018)   Pulmonary embolism (HCC)    April 2012 after CABG   S/P CABG (coronary artery bypass graft)    Sleep apnea    Spontaneous pneumothorax    left spontaneous pneumothorax/left pleural effusion, s/p chest tube 01/09/19; thoracentesis x2, s/p left VATS/tacl pleurodesis 04/12/19    Current Outpatient Medications  Medication Sig Dispense Refill   apixaban (ELIQUIS) 5 MG TABS tablet Take 1 tablet (5 mg total) by mouth 2 (two) times daily. 60 tablet    atorvastatin (LIPITOR) 80 MG tablet Take 1 tablet (80 mg total) by mouth daily. 30 tablet 6   cetirizine (ZYRTEC) 10 MG tablet Take 10 mg by mouth daily.     Cholecalciferol (VITAMIN  D-3) 25 MCG (1000 UT) CAPS Take 1,000 Units by mouth in the morning.     Cyanocobalamin (VITAMIN B 12 PO) Take 1 tablet by mouth daily.     finasteride (PROSCAR) 5 MG tablet Take 5 mg by mouth daily.     furosemide (LASIX) 20 MG tablet Take 2 tablets (40 mg total) by mouth daily. 60 tablet 3   isosorbide mononitrate (IMDUR) 30 MG 24 hr tablet Take 0.5 tablets (15 mg total) by mouth daily.     levothyroxine (SYNTHROID) 50 MCG tablet Take 50 mcg by mouth at bedtime.     metoprolol succinate (TOPROL  XL) 25 MG 24 hr tablet Take 1 tablet (25 mg total) by mouth daily.     mexiletine (MEXITIL) 150 MG capsule Take 150 mg by mouth 2 (two) times daily.     midodrine (PROAMATINE) 2.5 MG tablet Take 1 tablet (2.5 mg total) by mouth 2 (two) times daily with a meal. 180 tablet 3   Multiple Vitamin (MULTIVITAMIN WITH MINERALS) TABS tablet Take 1 tablet by mouth in the morning.     pantoprazole (PROTONIX) 40 MG tablet Take 40 mg by mouth daily before breakfast.     potassium chloride (KLOR-CON M) 10 MEQ tablet Take 1 tablet (10 mEq total) by mouth daily. 30 tablet 3   primidone (MYSOLINE) 50 MG tablet Take 25 mg by mouth at bedtime.     spironolactone (ALDACTONE) 25 MG tablet Take 0.5 tablets (12.5 mg total) by mouth at bedtime. 15 tablet 2   No current facility-administered medications for this encounter.    Physical Exam: BP 138/80   Pulse 79   Ht 6\' 3"  (1.905 m)   Wt 94.3 kg   BMI 26.00 kg/m   GEN: Well nourished, well developed in no acute distress NECK: No JVD; No carotid bruits CARDIAC: Regular rate and rhythm, no murmurs, rubs, gallops RESPIRATORY:  Clear to auscultation without rales, wheezing or rhonchi  ABDOMEN: Soft, non-tender, non-distended EXTREMITIES:  No edema; No deformity    Wt Readings from Last 3 Encounters:  08/17/23 94.3 kg  08/03/23 93 kg  07/30/23 94.3 kg     EKG today demonstrates  A paced rhythm, RBBB, LAFB Vent. rate 79 BPM PR interval 246 ms QRS duration 162 ms QT/QTcB 436/499 ms   Echo 10/30/22 demonstrated   1. Left ventricular ejection fraction, by estimation, is 45 to 50%. Left  ventricular ejection fraction by PLAX is 49 %. The left ventricle has  mildly decreased function. The left ventricle demonstrates regional wall  motion abnormalities (see scoring diagram/findings for description). Left ventricular diastolic parameters are consistent with Grade I diastolic dysfunction (impaired relaxation). There is moderate hypokinesis of the left  ventricular, basal-mid inferior wall and inferolateral wall.   2. Right ventricular systolic function is low normal. The right  ventricular size is mildly enlarged. There is normal pulmonary artery  systolic pressure. The estimated right ventricular systolic pressure is  34.4 mmHg.   3. The mitral valve is abnormal. Trivial mitral valve regurgitation.  Moderate to severe mitral annular calcification.   4. The aortic valve is tricuspid. There is moderate calcification of the  aortic valve. Aortic valve regurgitation is trivial. Moderate aortic valve  stenosis. Aortic valve area, by VTI measures 1.13 cm. Aortic valve mean  gradient measures 11.0 mmHg. Aortic valve Vmax measures 2.18 m/s. Peak gradient 19 mmHg, DI 0.40. LVOT diameter 1.9 cm.   5. Aortic dilatation noted. There is borderline dilatation of the aortic  root, measuring 38 mm. There is mild dilatation of the ascending aorta,  measuring 43 mm.   6. The inferior vena cava is dilated in size with <50% respiratory  variability, suggesting right atrial pressure of 15 mmHg.   Comparison(s): Changes from prior study are noted. 02/23/2022: LVEF 40-45%,  mild to moderate AS.    CHA2DS2-VASc Score = 5  The patient's score is based upon: CHF History: 1 HTN History: 1 Diabetes History: 0 Stroke History: 0 Vascular Disease History: 1 Age Score: 2 Gender Score: 0        ASSESSMENT AND PLAN: Persistent Atrial Fibrillation (ICD10:  I48.19) The patient's CHA2DS2-VASc score is 5, indicating a 7.2% annual risk of stroke.   Previously not tolerated amiodarone.  S/p DCCV on 08/03/23 Patient appears to be maintaining SR Patient started primidone, educated on interaction with Eliquis. Encouraged him to change to edoxaban as soon as possible to avoid subtherapeutic anticoagulation if holding primidone is not an option.  Continue Toprol 25 mg daily Not felt to be a candidate for sotalol.   Secondary Hypercoagulable State (ICD10:   D68.69) The patient is at significant risk for stroke/thromboembolism based upon his CHA2DS2-VASc Score of 5.  Continue Apixaban (Eliquis).   CAD S/p CABG On statin No anginal symptoms  HTN Stable on current regimen  Chronic systolic CHF ICM S/p ICD Followed in the [facility] Fluid status appears stable  VT/syncope S/p ICD On mexiletine  Followed by Dr Lalla Brothers    Follow up with Dr Lalla Brothers as scheduled.     Jorja Loa PA-C [facility] [address] [place], [place] [address] [phone]

## 2023-08-26 ENCOUNTER — Ambulatory Visit: Payer: Medicare Other

## 2023-08-27 ENCOUNTER — Encounter: Payer: Self-pay | Admitting: Cardiology

## 2023-08-28 ENCOUNTER — Telehealth (HOSPITAL_COMMUNITY): Payer: Self-pay | Admitting: Cardiology

## 2023-08-28 NOTE — Telephone Encounter (Signed)
Pt called to report PCP decreased diuretic due to kidney infection Lasix 40 daily -->20 daily   Pt is not on abx ?AKI Will request labs/ov from PCP   Advised pt to continue med changes as advised by pcp Monitor for changes (weight gain, swelling, SOB etc)  -further instructions will come from HF  provider

## 2023-09-28 ENCOUNTER — Other Ambulatory Visit (HOSPITAL_COMMUNITY): Payer: Medicare Other

## 2023-09-28 ENCOUNTER — Encounter (HOSPITAL_COMMUNITY): Payer: Medicare Other | Admitting: Cardiology

## 2023-10-04 NOTE — Progress Notes (Signed)
  Electrophysiology Office Follow up Visit Note:    Date:  10/05/2023   ID:  [name], DOB 08-18-1931, MRN [id number]  PCP:  [name], MD  [facility] Cardiologist:  None  [facility] Electrophysiologist:  OLE ONEIDA HOLTS, MD    Interval History:     Eric Gordon is a 89 y.o. male who presents for a follow up visit.   He last saw Daril in the AF clinic 08/17/2023. He has chronic systolic heart failure c/b VT s/p ICD, CAD, OSA, CAD s/p CABG and AF. He takes mexiletine for his VT history. Was previously on Amiodarone  but did not tolerate. He takes eliquis  for stroke ppx. There is a known interaction between eliquis  and primidone  and he was previously encouraged to transition to edoxaban .  [facility], it looks like he transitioned to edoxaban .   He is doing quite well.  No recurrent arrhythmias.  His energy level is improving.  He takes edoxaban  for stroke prophylaxis without bleeding issues.  He is interested in getting back to the [facility] to do some exercise.      Past medical, surgical, social and family history were reviewed.  ROS:   Please see the history of present illness.    All other systems reviewed and are negative.  EKGs/Labs/Other Studies Reviewed:    The following studies were reviewed today:  10/05/2023 in clinic device interrogation personally reviewed Battery and lead parameter stable.  No recurrent A-fib since cardioversion No recurrent VT         Physical Exam:    VS:  BP 110/68 (BP Location: Left Arm, Patient Position: Sitting, Cuff Size: Large)   Pulse 70   Ht 6' 3 (1.905 m)   Wt 212 lb (96.2 kg)   SpO2 97%   BMI 26.50 kg/m     Wt Readings from Last 3 Encounters:  10/05/23 212 lb (96.2 kg)  08/17/23 208 lb (94.3 kg)  08/03/23 205 lb (93 kg)     GEN: no distress.  Elderly CARD: RRR, No MRG.  ICD pocket well-healed.  Superior portion of the pocket with some tissue thinning but skin integrity is  intact. RESP: No IWOB. CTAB.      ASSESSMENT:    1. Persistent atrial fibrillation (HCC)   2. VT (ventricular tachycardia) (HCC)   3. Coronary artery disease involving native heart without angina pectoris, unspecified vessel or lesion type   4. ICD (implantable cardioverter-defibrillator) in place   5. Heart failure with mildly reduced ejection fraction (HFmrEF) (HCC)    PLAN:    In order of problems listed above:  #HFrEF #ICD in situ #VT NYHA II. Warm and dry on exam. Cont GDMT. Cont mexiletine  #Persistent AF On edoxaban  for stroke ppx Maintaining sinus rhythm  Follow-up 6 months with APP  Signed, Ole Holts, MD, [facility], [facility] 10/05/2023 9:37 AM    Electrophysiology [facility]

## 2023-10-05 ENCOUNTER — Encounter: Payer: Self-pay | Admitting: Cardiology

## 2023-10-05 ENCOUNTER — Ambulatory Visit: Payer: Medicare Other | Attending: Cardiology | Admitting: Cardiology

## 2023-10-05 VITALS — BP 110/68 | HR 70 | Ht 75.0 in | Wt 212.0 lb

## 2023-10-05 DIAGNOSIS — I4819 Other persistent atrial fibrillation: Secondary | ICD-10-CM

## 2023-10-05 DIAGNOSIS — I5020 Unspecified systolic (congestive) heart failure: Secondary | ICD-10-CM

## 2023-10-05 DIAGNOSIS — I472 Ventricular tachycardia, unspecified: Secondary | ICD-10-CM | POA: Diagnosis not present

## 2023-10-05 DIAGNOSIS — Z95810 Presence of automatic (implantable) cardiac defibrillator: Secondary | ICD-10-CM

## 2023-10-05 DIAGNOSIS — I5022 Chronic systolic (congestive) heart failure: Secondary | ICD-10-CM | POA: Diagnosis not present

## 2023-10-05 DIAGNOSIS — I2510 Atherosclerotic heart disease of native coronary artery without angina pectoris: Secondary | ICD-10-CM | POA: Diagnosis not present

## 2023-10-05 LAB — CUP PACEART INCLINIC DEVICE CHECK
Date Time Interrogation Session: 20250113094823
Implantable Lead Connection Status: 753985
Implantable Lead Connection Status: 753985
Implantable Lead Implant Date: 20230606
Implantable Lead Implant Date: 20230606
Implantable Lead Location: 753859
Implantable Lead Location: 753860
Implantable Lead Model: 377
Implantable Lead Model: 402266
Implantable Lead Serial Number: 8000807784
Implantable Lead Serial Number: 81503989
Implantable Pulse Generator Implant Date: 20230606
Pulse Gen Model: 429534
Pulse Gen Serial Number: 84900693

## 2023-10-05 NOTE — Patient Instructions (Signed)
 Medication Instructions:  Your physician recommends that you continue on your current medications as directed. Please refer to the Current Medication list given to you today.  *If you need a refill on your cardiac medications before your next appointment, please call your pharmacy*  Follow-Up: At [facility], you and your health needs are our priority.  As part of our continuing mission to provide you with exceptional heart care, we have created designated Provider Care Teams.  These Care Teams include your primary Cardiologist (physician) and Advanced Practice Providers (APPs -  Physician Assistants and Nurse Practitioners) who all work together to provide you with the care you need, when you need it.   Your next appointment:   6 months  Provider:   You will see one of the following Advanced Practice Providers on your designated Care Team:   Francis Dowse, Charlott Holler [address]" [place], [place] Sherie Don, NP Canary Brim, NP

## 2023-11-16 ENCOUNTER — Ambulatory Visit (INDEPENDENT_AMBULATORY_CARE_PROVIDER_SITE_OTHER)
Admission: RE | Admit: 2023-11-16 | Discharge: 2023-11-16 | Disposition: A | Discharge status: Home / Self Care | Payer: Medicare Other | Source: Ambulatory Visit | Attending: Surgery | Admitting: Surgery

## 2023-11-16 ENCOUNTER — Encounter: Payer: Self-pay | Admitting: Surgery

## 2023-11-16 ENCOUNTER — Ambulatory Visit (INDEPENDENT_AMBULATORY_CARE_PROVIDER_SITE_OTHER): Payer: Medicare Other | Admitting: Surgery

## 2023-11-16 ENCOUNTER — Ambulatory Visit (HOSPITAL_COMMUNITY)
Admission: RE | Admit: 2023-11-16 | Discharge: 2023-11-16 | Disposition: A | Discharge status: Home / Self Care | Payer: Medicare Other | Source: Ambulatory Visit | Attending: Surgery | Admitting: Surgery

## 2023-11-16 VITALS — BP 134/66 | HR 79 | Temp 97.8°F | Resp 20 | Ht 75.0 in | Wt 212.0 lb

## 2023-11-16 DIAGNOSIS — I7143 Infrarenal abdominal aortic aneurysm, without rupture: Secondary | ICD-10-CM | POA: Diagnosis not present

## 2023-11-16 DIAGNOSIS — I6523 Occlusion and stenosis of bilateral carotid arteries: Secondary | ICD-10-CM | POA: Insufficient documentation

## 2023-11-16 NOTE — Progress Notes (Signed)
 [facility] Specialist of [place]  Patient name: Eric Gordon MRN: [id number] DOB: Jun 29, 1931 Sex: male   REASON FOR VISIT:    Follow up  HISOTRY OF PRESENT ILLNESS:   Eric Gordon is a 89 y.o. male who returns today for follow-up.  He is status post endovascular aneurysm repair on 08/06/2022.  He is status post right sided TCAR on 09/13/2020 for 90% stenosis. He then underwent left-sided TCAR on 02/08/2021 for 90% stenosis.  He has a family history of having 2 uncles died from ruptured aneurysms.  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  His last ultrasound showed an unchanged aortic diameter of 5 cm.  He had a CT scan for his Parkinson's disease at his last visit that suggested a partially collapsed stent, however upon further review it looks like it is more from a anterior calcified plaque.   Patient has a history of coronary artery disease status post CABG.  He has had a NSTEMI from occluded bypass grafts.  He has atrial fibrillation.  He is anticoagulated on Eliquis.  He has had a spontaneous pneumothorax requiring chest tube decompression he gets dyspneic at 150 to 200 yards.  He is medically managed for hypertension.  He takes a statin for hypercholesterolemia.  He recently had a defibrillator placed for V. tach and NYHA class III heart failure   PAST MEDICAL HISTORY:   Past Medical History:  Diagnosis Date   Abdominal aortic aneurysm (AAA) (HCC)    Achilles tendinitis of right lower extremity    AICD (automatic cardioverter/defibrillator) present    Arthritis    "my whole body" (03/19/2018)   Atherosclerosis of coronary artery bypass graft w/o angina pectoris    Atrial fibrillation (HCC)    Barrett's esophagus    CAD (coronary artery disease)    Carotid artery disease (HCC)    CHF (congestive heart failure) (HCC)    Chronic anticoagulation    PE   Chronic lower back pain    Dyspnea     Dysrhythmia    Essential tremor    GERD (gastroesophageal reflux disease)    High cholesterol    Hyperlipidemia    Hyperplasia, prostate    Hypertension    Hypothyroidism    Myocardial infarction (HCC)    2 in Jan. 2019   Nail dystrophy    OSA on CPAP    uses CPAP   Osteoarthrosis    Pneumonia    "now and once before" (03/19/2018)   Pulmonary embolism [facility])    April 2012 after CABG   S/P CABG (coronary artery bypass graft)    Sleep apnea    Spontaneous pneumothorax    left spontaneous pneumothorax/left pleural effusion, s/p chest tube 01/09/19; thoracentesis x2, s/p left VATS/tacl pleurodesis 04/12/19     FAMILY HISTORY:   Family History  Problem Relation Age of Onset   Coronary artery disease Mother    Hypertension Mother    Heart disease Mother     SOCIAL HISTORY:   Social History   Tobacco Use   Smoking status: Former    Current packs/day: 0.00    Average packs/day: 1 pack/day for 35.0 years (35.0 ttl pk-yrs)    Types: Cigarettes    Start date: 02/17/1949    Quit date: 02/18/1984    Years since quitting: 39.7   Smokeless tobacco: Never   Tobacco comments:    Former smoker 08/17/23  Substance Use Topics   Alcohol use: Yes  Alcohol/week: 2.0 standard drinks of alcohol    Types: 2 Standard drinks or equivalent per week    Comment: 1-2 glasses of wine weekly 08/17/23     ALLERGIES:   Allergies  Allergen Reactions   Ancef [Cefazolin Sodium] Hives   Jardiance [Empagliflozin] Other (See Comments)    Dizziness   Vancomycin Rash     CURRENT MEDICATIONS:   Current Outpatient Medications  Medication Sig Dispense Refill   atorvastatin (LIPITOR) 80 MG tablet Take 1 tablet (80 mg total) by mouth daily. 30 tablet 6   cetirizine (ZYRTEC) 10 MG tablet Take 10 mg by mouth daily.     Cholecalciferol (VITAMIN D-3) 25 MCG (1000 UT) CAPS Take 1,000 Units by mouth in the morning.     Cyanocobalamin (VITAMIN B 12 PO) Take 1 tablet by mouth daily.     edoxaban  (SAVAYSA) 30 MG TABS tablet Take 30 mg by mouth daily.     finasteride (PROSCAR) 5 MG tablet Take 5 mg by mouth daily.     furosemide (LASIX) 20 MG tablet Take 2 tablets (40 mg total) by mouth daily. 60 tablet 3   isosorbide mononitrate (IMDUR) 30 MG 24 hr tablet Take 0.5 tablets (15 mg total) by mouth daily.     levothyroxine (SYNTHROID) 50 MCG tablet Take 50 mcg by mouth at bedtime.     metoprolol succinate (TOPROL XL) 25 MG 24 hr tablet Take 1 tablet (25 mg total) by mouth daily.     mexiletine (MEXITIL) 150 MG capsule Take 150 mg by mouth 2 (two) times daily.     midodrine (PROAMATINE) 2.5 MG tablet Take 1 tablet (2.5 mg total) by mouth 2 (two) times daily with a meal. 180 tablet 3   Multiple Vitamin (MULTIVITAMIN WITH MINERALS) TABS tablet Take 1 tablet by mouth in the morning.     pantoprazole (PROTONIX) 40 MG tablet Take 40 mg by mouth daily before breakfast.     potassium chloride (KLOR-CON M) 10 MEQ tablet Take 1 tablet (10 mEq total) by mouth daily. 30 tablet 3   primidone (MYSOLINE) 50 MG tablet Take 25 mg by mouth at bedtime.     spironolactone (ALDACTONE) 25 MG tablet Take 0.5 tablets (12.5 mg total) by mouth at bedtime. 15 tablet 2   No current facility-administered medications for this visit.    REVIEW OF SYSTEMS:   [X]  denotes positive finding, [ ]  denotes negative finding Cardiac  Comments:  Chest pain or chest pressure:    Shortness of breath upon exertion:    Short of breath when lying flat:    Irregular heart rhythm:        Vascular    Pain in calf, thigh, or hip brought on by ambulation:    Pain in feet at night that wakes you up from your sleep:     Blood clot in your veins:    Leg swelling:         Pulmonary    Oxygen at home:    Productive cough:     Wheezing:         Neurologic    Sudden weakness in arms or legs:     Sudden numbness in arms or legs:     Sudden onset of difficulty speaking or slurred speech:    Temporary loss of vision in one eye:      Problems with dizziness:         Gastrointestinal    Blood in stool:  Vomited blood:         Genitourinary    Burning when urinating:     Blood in urine:        Psychiatric    Major depression:         Hematologic    Bleeding problems:    Problems with blood clotting too easily:        Skin    Rashes or ulcers:        Constitutional    Fever or chills:      PHYSICAL EXAM:   Vitals:   11/16/23 0946 11/16/23 0949  BP: 122/73 134/66  Pulse: 79   Resp: 20   Temp: 97.8 F (36.6 C)   SpO2: 95%   Weight: 212 lb (96.2 kg)   Height: 6\' 3"  (1.905 m)     GENERAL: The patient is a well-nourished male, in no acute distress. The vital signs are documented above. CARDIAC: There is a regular rate and rhythm.  PULMONARY: Non-labored respirations ABDOMEN: Soft and non-tender  MUSCULOSKELETAL: There are no major deformities or cyanosis. NEUROLOGIC: No focal weakness or paresthesias are detected. SKIN: There are no ulcers or rashes noted. PSYCHIATRIC: The patient has a normal affect.  STUDIES:   I have reviewed the following: Carotid duplex: Right Carotid: The ECA appears >50% stenosed. Patent stent with no  evidence for                 restenosis.   Left Carotid: The ECA appears >50% stenosed. Patent stent with no evidence  for               restenosis.   Vertebrals:  Bilateral vertebral arteries demonstrate antegrade flow.  Subclavians: Normal flow hemodynamics were seen in bilateral subclavian               arteries.   AAA duplex: Abdominal Aorta: The largest EVAR diameter remains essentially unchanged  compared to prior exam (4.89 x 4.91. Previous diameter measurement was 4.9  cm obtained on 05/11/2023 Duplex.  MEDICAL ISSUES:   AAA: Maximum diameter is 4.9 cm which is stable.  He has a persistent endoleak but no change in his aneurysm sac.  I will repeat his ultrasound in 1 year  Carotid: No significant in-stent stenosis.  This reassures me that the CT scan  findings were from the anterior plaque and not a collapsed stent.  He will have this repeated in 1 year.    Charlena Cross, MD, FACS [facility] [phone] Pager [phone]

## 2023-11-25 ENCOUNTER — Ambulatory Visit (INDEPENDENT_AMBULATORY_CARE_PROVIDER_SITE_OTHER): Payer: Medicare Other

## 2023-11-25 DIAGNOSIS — R55 Syncope and collapse: Secondary | ICD-10-CM

## 2023-11-28 LAB — CUP PACEART REMOTE DEVICE CHECK
Date Time Interrogation Session: 20250306135452
Implantable Lead Connection Status: 753985
Implantable Lead Connection Status: 753985
Implantable Lead Implant Date: 20230606
Implantable Lead Implant Date: 20230606
Implantable Lead Location: 753859
Implantable Lead Location: 753860
Implantable Lead Model: 377
Implantable Lead Model: 402266
Implantable Lead Serial Number: 8000807784
Implantable Lead Serial Number: 81503989
Implantable Pulse Generator Implant Date: 20230606
Pulse Gen Model: 429534
Pulse Gen Serial Number: 84900693

## 2023-12-03 ENCOUNTER — Telehealth (HOSPITAL_COMMUNITY): Payer: Self-pay | Admitting: Cardiology

## 2023-12-03 ENCOUNTER — Encounter: Payer: Self-pay | Admitting: Cardiology

## 2023-12-15 DIAGNOSIS — E1122 Type 2 diabetes mellitus with diabetic chronic kidney disease: Secondary | ICD-10-CM | POA: Diagnosis not present

## 2023-12-15 DIAGNOSIS — E039 Hypothyroidism, unspecified: Secondary | ICD-10-CM | POA: Diagnosis not present

## 2023-12-15 DIAGNOSIS — I350 Nonrheumatic aortic (valve) stenosis: Secondary | ICD-10-CM | POA: Diagnosis not present

## 2023-12-15 DIAGNOSIS — E669 Obesity, unspecified: Secondary | ICD-10-CM | POA: Diagnosis not present

## 2023-12-15 DIAGNOSIS — I495 Sick sinus syndrome: Secondary | ICD-10-CM | POA: Diagnosis not present

## 2023-12-15 DIAGNOSIS — E785 Hyperlipidemia, unspecified: Secondary | ICD-10-CM | POA: Diagnosis not present

## 2023-12-15 DIAGNOSIS — I2510 Atherosclerotic heart disease of native coronary artery without angina pectoris: Secondary | ICD-10-CM | POA: Diagnosis not present

## 2023-12-15 DIAGNOSIS — I130 Hypertensive heart and chronic kidney disease with heart failure and stage 1 through stage 4 chronic kidney disease, or unspecified chronic kidney disease: Secondary | ICD-10-CM | POA: Diagnosis not present

## 2023-12-15 DIAGNOSIS — I5022 Chronic systolic (congestive) heart failure: Secondary | ICD-10-CM | POA: Diagnosis not present

## 2023-12-15 DIAGNOSIS — I472 Ventricular tachycardia, unspecified: Secondary | ICD-10-CM | POA: Diagnosis not present

## 2023-12-15 DIAGNOSIS — N1831 Chronic kidney disease, stage 3a: Secondary | ICD-10-CM | POA: Diagnosis not present

## 2023-12-15 DIAGNOSIS — I480 Paroxysmal atrial fibrillation: Secondary | ICD-10-CM | POA: Diagnosis not present

## 2024-01-05 ENCOUNTER — Telehealth (HOSPITAL_COMMUNITY): Payer: Self-pay | Admitting: Cardiology

## 2024-01-05 NOTE — Telephone Encounter (Signed)
 Called to confirm/remind patient of their appointment at the [facility] on 01/05/24 .   Appointment:   [x] Confirmed  [] Left mess   [] No answer/No voice mail  [] Phone not in service  Patient reminded to bring all medications and/or complete list.  Confirmed patient has transportation. Gave directions, instructed to utilize valet parking.

## 2024-01-07 NOTE — Progress Notes (Signed)
 Remote ICD transmission.

## 2024-01-07 NOTE — Addendum Note (Signed)
 Addended by: Lott Rouleau A on: 01/07/2024 01:17 PM   Modules accepted: Orders

## 2024-01-12 ENCOUNTER — Ambulatory Visit (HOSPITAL_COMMUNITY)
Admission: RE | Admit: 2024-01-12 | Discharge: 2024-01-12 | Disposition: A | Discharge status: Home / Self Care | Source: Ambulatory Visit | Attending: Cardiology | Admitting: Cardiology

## 2024-01-12 ENCOUNTER — Ambulatory Visit (HOSPITAL_COMMUNITY)
Admission: RE | Admit: 2024-01-12 | Discharge: 2024-01-12 | Disposition: A | Discharge status: Home / Self Care | Source: Ambulatory Visit | Attending: Family Medicine | Admitting: Family Medicine

## 2024-01-12 VITALS — BP 128/70 | HR 63 | Wt 204.0 lb

## 2024-01-12 DIAGNOSIS — I951 Orthostatic hypotension: Secondary | ICD-10-CM | POA: Diagnosis not present

## 2024-01-12 DIAGNOSIS — I472 Ventricular tachycardia, unspecified: Secondary | ICD-10-CM | POA: Insufficient documentation

## 2024-01-12 DIAGNOSIS — Z95810 Presence of automatic (implantable) cardiac defibrillator: Secondary | ICD-10-CM | POA: Insufficient documentation

## 2024-01-12 DIAGNOSIS — I5022 Chronic systolic (congestive) heart failure: Secondary | ICD-10-CM | POA: Diagnosis not present

## 2024-01-12 DIAGNOSIS — I2510 Atherosclerotic heart disease of native coronary artery without angina pectoris: Secondary | ICD-10-CM

## 2024-01-12 DIAGNOSIS — I714 Abdominal aortic aneurysm, without rupture, unspecified: Secondary | ICD-10-CM | POA: Insufficient documentation

## 2024-01-12 DIAGNOSIS — Z79899 Other long term (current) drug therapy: Secondary | ICD-10-CM | POA: Diagnosis not present

## 2024-01-12 DIAGNOSIS — I452 Bifascicular block: Secondary | ICD-10-CM | POA: Diagnosis not present

## 2024-01-12 DIAGNOSIS — Z955 Presence of coronary angioplasty implant and graft: Secondary | ICD-10-CM | POA: Insufficient documentation

## 2024-01-12 DIAGNOSIS — I080 Rheumatic disorders of both mitral and aortic valves: Secondary | ICD-10-CM | POA: Diagnosis not present

## 2024-01-12 DIAGNOSIS — I130 Hypertensive heart and chronic kidney disease with heart failure and stage 1 through stage 4 chronic kidney disease, or unspecified chronic kidney disease: Secondary | ICD-10-CM | POA: Diagnosis not present

## 2024-01-12 DIAGNOSIS — N183 Chronic kidney disease, stage 3 unspecified: Secondary | ICD-10-CM | POA: Diagnosis not present

## 2024-01-12 DIAGNOSIS — G250 Essential tremor: Secondary | ICD-10-CM | POA: Diagnosis not present

## 2024-01-12 DIAGNOSIS — G8929 Other chronic pain: Secondary | ICD-10-CM | POA: Insufficient documentation

## 2024-01-12 DIAGNOSIS — I252 Old myocardial infarction: Secondary | ICD-10-CM | POA: Insufficient documentation

## 2024-01-12 DIAGNOSIS — Z7901 Long term (current) use of anticoagulants: Secondary | ICD-10-CM | POA: Diagnosis not present

## 2024-01-12 DIAGNOSIS — I253 Aneurysm of heart: Secondary | ICD-10-CM | POA: Diagnosis not present

## 2024-01-12 DIAGNOSIS — I495 Sick sinus syndrome: Secondary | ICD-10-CM | POA: Diagnosis not present

## 2024-01-12 DIAGNOSIS — E785 Hyperlipidemia, unspecified: Secondary | ICD-10-CM | POA: Insufficient documentation

## 2024-01-12 DIAGNOSIS — I480 Paroxysmal atrial fibrillation: Secondary | ICD-10-CM | POA: Diagnosis not present

## 2024-01-12 DIAGNOSIS — R0789 Other chest pain: Secondary | ICD-10-CM | POA: Insufficient documentation

## 2024-01-12 DIAGNOSIS — Z87891 Personal history of nicotine dependence: Secondary | ICD-10-CM | POA: Insufficient documentation

## 2024-01-12 LAB — BASIC METABOLIC PANEL WITH GFR
Anion gap: 7 (ref 5–15)
BUN: 37 mg/dL — ABNORMAL HIGH (ref 8–23)
CO2: 28 mmol/L (ref 22–32)
Calcium: 10 mg/dL (ref 8.9–10.3)
Chloride: 101 mmol/L (ref 98–111)
Creatinine, Ser: 1.92 mg/dL — ABNORMAL HIGH (ref 0.61–1.24)
GFR, Estimated: 33 mL/min — ABNORMAL LOW (ref >60)
Glucose, Bld: 99 mg/dL (ref 70–99)
Potassium: 4.5 mmol/L (ref 3.5–5.1)
Sodium: 136 mmol/L (ref 135–145)

## 2024-01-12 LAB — CBC
HCT: 42.7 % (ref 39.0–52.0)
Hemoglobin: 13.6 g/dL (ref 13.0–17.0)
MCH: 30.6 pg (ref 26.0–34.0)
MCHC: 31.9 g/dL (ref 30.0–36.0)
MCV: 96.2 fL (ref 80.0–100.0)
Platelets: 167 10*3/uL (ref 150–400)
RBC: 4.44 MIL/uL (ref 4.22–5.81)
RDW: 14 % (ref 11.5–15.5)
WBC: 7.1 10*3/uL (ref 4.0–10.5)
nRBC: 0 % (ref 0.0–0.2)

## 2024-01-12 LAB — LIPID PANEL
Cholesterol: 118 mg/dL (ref 0–200)
HDL: 41 mg/dL (ref >40)
LDL Cholesterol: 60 mg/dL (ref 0–99)
Total CHOL/HDL Ratio: 2.9 ratio
Triglycerides: 86 mg/dL (ref <150)
VLDL: 17 mg/dL (ref 0–40)

## 2024-01-12 LAB — ECHOCARDIOGRAM COMPLETE
AR max vel: 0.92 cm2
AV Area VTI: 1.01 cm2
AV Area mean vel: 0.89 cm2
AV Mean grad: 15 mmHg
AV Peak grad: 24.1 mmHg
Ao pk vel: 2.45 m/s
Area-P 1/2: 4.1 cm2
S' Lateral: 4.9 cm
Single Plane A4C EF: 41.9 %

## 2024-01-12 LAB — BRAIN NATRIURETIC PEPTIDE: B Natriuretic Peptide: 308.3 pg/mL — ABNORMAL HIGH (ref 0.0–100.0)

## 2024-01-12 NOTE — Patient Instructions (Signed)
 STOP Midodrine   STOP Imdur .  Labs done today, your results will be available in MyChart, we will contact you for abnormal readings.  Your physician recommends that you schedule a follow-up appointment in: 3 months.  If you have any questions or concerns before your next appointment please send us  a message through [place] or call our office at [phone].    TO LEAVE A MESSAGE FOR THE NURSE SELECT OPTION 2, PLEASE LEAVE A MESSAGE INCLUDING: YOUR NAME DATE OF BIRTH CALL BACK NUMBER REASON FOR CALL**this is important as we prioritize the call backs  YOU WILL RECEIVE A CALL BACK THE SAME DAY AS LONG AS YOU CALL BEFORE 4:00 PM  At the [facility], you and your health needs are our priority. As part of our continuing mission to provide you with exceptional heart care, we have created designated Provider Care Teams. These Care Teams include your primary Cardiologist (physician) and Advanced Practice Providers (APPs- Physician Assistants and Nurse Practitioners) who all work together to provide you with the care you need, when you need it.   You may see any of the following providers on your designated Care Team at your next follow up: Dr Jules Oar Dr Peder Bourdon Dr. Alwin Baars Dr. Arta Lark Amy Marijane Shoulders, NP Ruddy Corral, [place] [facility] [place], [place] Dennise Fitz, NP [place] Lee, NP Shawnee Dellen, NP Luster Salters, PharmD Bevely Brush, PharmD   Please be sure to bring in all your medications bottles to every appointment.    Thank you for choosing [facility]

## 2024-01-12 NOTE — Progress Notes (Signed)
 [facility] Note  Date:  01/12/2024   ID:  Eric Gordon, DOB 03/09/1931, MRN [id number]   Provider location: [place] [facility] Type of Visit: Established patient  PCP:  Margarete Sharps, MD  EP: Dr. Marven Slimmer HF Cardiologist:  Dr. Mitzie Anda  Chief complaint: CHF   HPI: Eric Gordon is a 89 y.o. male with history of CAD s/p CABG.  He has had trouble long-term with exertional dyspnea.  Dyspnea triggered evaluation in 2012 leading to CABG but was not resolved by CABG.  PFTs in 3/14 showed only mild obstructive airways disease and V/Q scan showed no PE.  Echo in 9/14 showed normal LV systolic function with moderately dilated RV. RHC in 2/15 showed normal right and left heart filling pressures and normal PA pressure.  Finally, he had a CPX in 3/15 that showed normal capacity compared to age-matched sedentary norms.  He was noted to have chronotropic incompetence.  At a prior appointment, I took him off metoprolol  given chronotropic incompetence noted on CPX.  Dyspnea improved significantly with weight loss.  He had Cardiolite in 8/18 with EF 52%, no ischemia or infarction.    He was admitted in 1/20 with NSTEMI.  LHC showed new occlusion of the branch of SVG-ramus and OM that touched down on OM.  There were also serial 70% stenoses in the mid/distal RCA.  There was not thought to be an interventional option and patient was treated medically. He was discharged home but presented a couple days later with recurrent chest pain and dyspnea.  Cath was repeated showing no change from prior.  This admission, he had FFR of the RCA which did not suggest hemodynamic significance.  Echo showed EF 55-60%, normal RV.  CTA did not show a PE. He was noted to be volume overloaded and was diuresed.  He was also noted to be in atrial fibrillation with RVR transiently.  ASA/Plavix  was stopped and Eliquis  was started. He was discharged to SNF.  Atrial fibrillation recurred and he was  started on amiodarone  with conversion back to NSR.   In 4/20, he was admitted with left spontaneous PTX requiring chest tube.  After this, he developed recurrent left pleural effusions requiring thoracentesis x 3 so far.  Pleural fluid was exudative with eosinophil predominance.  With elevated ESR, there was concern that amiodarone  could be involved, so this medication was stopped.  CT chest in 6/20 showed LLL consolidation/collapse with small left effusion, there was a rim-enhancing lesion posterior to the heart between esophagus and left atrium, ?loculated fluid.  TEE was done in 7/20 and confirmed loculated pleural effusion behind the left atrium.  In 7/20, patient had VATS on the left.   Carotid dopplers in 9/21 showed 80-99% RICA stenosis, 60-79% LICA.  AAA US  in 9/21 showed 4.5 cm AAA. Patient was referred to Dr. Charlotte Cookey for evaluation => he has had TCAR on right in 12/21.  He had left TCAR in 5/22.   Cardiolite in 12/21 with EF 41%, no ischemia, fixed inferolateral defect. Echo in 1/22 showed EF 40-45%, basal to mid inferolateral AK, basal inferior HK, normal RV, mild aortic stenosis mean gradient 11 mmHg.  He was unable to tolerate dapagliflozin  and is off it. He was lightheaded on low dose Entresto  so this was stopped.   Zio monitor in 1/23 showed 25% atrial flutter.  He was referred to EP, by the time he saw EP, burden of atrial arrhythmias was lower.   Cardiolite in  3/23 showed prior inferolateral and inferior infarct, no ischemia.   Patient was admitted in 4/23 with atypical chest pain.  This likely was due to cough/bronchitis.  CTA chest showed inferolateral wall pseudoaneurysm, this is chronic.  Echo in 4/23 showed EF 40-45%, akinesis of the inferolateral wall, basal inferolateral pseudoaneurysm, moderate RV enlargement, normal RV systolic function, mild AS with mean gradient 10 mmHg.   Zio monitor in 5/23 showed 2% AF burden, improved from prior.     Follow up 5/23, stable NYHA II,  euvolemic. Off several meds due to orthostatic symptoms.  Admitted 6/23 with syncope, no evidence for ACS. 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 EP study with inducible VT. Underwent dual chamber Biotronik ICD placement. Discharged home, weight 208 lbs.  VT again in 9/23, treated with ATP.   Follow up 10/23, NYHA III and volume stable. GDMT limited by orthostatic symptoms and need for midodrine . Mexiletine started for recurrent VT.  S/p aortobi-iliac endograft repair of infrarenal abdominal aortic aneurysm 12/23. Type 2 endoleak noted on CTA in 1/24.   Patient was admitted in 1/24 with weakness, dyspnea, cough, atypical chest pain.  CTA chest showed not PE.  He ended up having cardiac cath, this showed no significant change to his coronary anatomy from the past.  Filling pressures were not elevated on RHC.  He was discharged home.   Echo in 2/24 showed EF 45-50%, basal-mid inferior/inferolateral akinesis, low normal RV function, moderate AS with mean gradient 11 and AVA 1.13 cm^2.   Admitted 3/24 with syncopal events, one event resulting in trauma to head and back. One event coincide with VT. Neuro consulted, suggested conservative non-op treatment, back brace for 6-8 weeks. Discharged on amiodarone  taper and off mexiletine.  Patient again tolerated amiodarone  poorly with increased dyspnea (happened last time he was on amiodarone ).  This was then stopped and mexiletine was started.   Recurrent atrial fibrillation in 11/24, s/p DCCV to NSR.   Echo was done today and reviewed, EF 45% with inferolateral akinesis and anterolateral hypokinesis, mild LV dilation, mild LVH, trileaflet aortic valve with mild AI, moderate AS with mean gradient 16 mmHg and AVA 1.0 cm^2 (DI 0.29).   Today he returns for HF and CAD follow up. Weight down 3 lbs.  He has chronic left-sided chest pain that never resolves.  Left chest is mildly tender.  No exertional component to  the chest pain. Occasional lightheadedness with standing but now rare.  No falls.  No syncope.  He is short of breath walking long distances, does fine around the house.  Fatigues easily.  No palpitations. No orthopnea/PND. Goes to the [facility].   ECG (personally reviewed): a-paced, RBBB, LAFB, PAC   Labs (2/20): LFTs normal, pro BNP 842 Labs (3/20): K 3.8, creatinine 1.48, TSH mildly elevated at 6.97, free T3 and free T4 normal Labs (4/20): K 3.9, creatinine 1.36 Labs (2/20): LDL 52 Labs (8/20): BNP 241, creatinine 1.21  Labs (9/20): BNP 244, K 4, creatinine 1.12 Labs (2/21): K 4.3, creatinine 1.26, BNP 271 Labs (6/21): K 4.2, creatinine 1.11, LDL 57 Labs (6/22): K 4.3, creatinine 1.16, LDL 49 Labs (8/22): K 4.3, creatinine 1.37 Labs (12/22): K 3.9, creatinine 1.18 Labs (4/23): K 4.7, creatinine 1.35, BNP 253 Labs (6/23): K 4.1, creatinine 0.95 => 1.28 Labs (10/23): K 4.4, creatinine 1.26 Labs (11/23): K 4.3, creatinine 1.28 Labs (1/24): K 4.2, creatinine 1.1, BNP 202, HS-TnI negative  Labs (3/24): K 4.1, creatinine 1.49 Labs (  5/24): K 4.5, creatinine 1.89, urine immunofixation negative, myeloma panel negative Labs (6/24): K 5.0, creatinine 1.67, LDL 73 Labs (11/24): hgb 13.8, K 4.2, creatinine 1.98   PMH: 1. Essential tremor 2. CAD: s/p CABG in 3/12.   - Cardiolite (8/18): EF 52%, no ischemia/infarction.  - NSTEMI (1/20):  LHC showed new occlusion of the branch of SVG-ramus and OM that touched down on OM.  There were also serial 70% stenoses in the mid/distal RCA.  FFR of RCA was negative.  - Cardiolite (12/21): EF 41%, no ischemia, fixed inferolateral defect - Cardiolite (3/23): LV EF 37%, fixed basal-mid inferolateral/inferior defect, no ischemia.  - LHC (1/24): Totally occluded SVG-PDA, occlusion of the branch of sequential SVG-ramus and OM that touches down on OM but patent ramus branch, occluded SVG-D, patent LIMA-LAD. Similar to prior, no intervention.  3. Atrial fibrillation:  Only noted post-op CABG.  4. PE: Post-op CABG in 2012.  5. AAA: 3.6 cm on US  in 3/14.  3.6 cm on US  in 3/15. 3.6 cm on US  8/16. 4.1 cm on US  4/17.  - AAA US  (5/18): 4.1 cm AAA.  - AAA US  (5/19): 4.1 cm AAA.  - AAA US  (4/20): 4.1 cm AAA.  - AAA US  (9/21): 4.5 cm AAA - AAA US  (9/22): 4.5 cm AAA - AAA US  (10/23): 4.9 cm AAA - s/p EVAR 12/23, type 2 endoleak noted on 1/24 CTA.  6. OSA: On CPAP.  7. Carotid stenosis: Carotid dopplers (9/14) with 60-79% LICA stenosis.  Carotid dopplers (9/15) with 60-79% LICA stenosis. Carotid dopplers (3/16) with 60-79% RCIA stenosis, 40-59% LICA stenosis.  - Carotid dopplers (8/16) with 40-59% RICA stenosis, 60-79% LICA stenosis.  - Carotid dopplers (8/17) with 60-79% RICA, 40-59% LICA. - Carotid dopplers (9/18) with 40-59% RICA, 60-79% LICA.   - Carotid dopplers (9/19) with 40-59% RICA, 60-79% LICA.  - Carotid dopplers (9/20) with 60-79% BICA - Carotid dopplers (9/21): 80-99% RICA, 60-79% LICA - Right TCAR 12/21, left TCAR 5/22.  - Carotid dopplers (4/23): patent ICA stents.  - Carotid dopplers (2/25): No in-stent restenosis.  8. Asthma 9. PNA x 2 10. Chronic diastolic CHF/dyspnea: Echo (9/14) with EF 60-65%, mild LVH, very mild AS with mean gradient 10 mmHg, RV moderately dilated.  PFTs (3/14) showed mild obstructive airways disease.  V/Q scan (3/14) with no PE.  ENT workup for upper respiratory causes was negative.  RHC (2/15) with mean RA 7, PA 32/12, mean PCWP 13, CI 3.18.  CPX (3/15) with peak VO2 16.4, VE/VCO2 33; normal when compared to age-matched sedentary normals; chronotropic incompetence was noted.  Dyspnea was improved with weight loss.  - Echo (8/17): EF 60-65%, mild AS.  - Echo (1/20): EF 55-60%, normal RV size and systolic function.  - Echo (6/20): EF 55%, mild LVH, mild AS, normal RV size and systolic function, ?LA mass or mass impinging on posterior LA.  - TEE (7/20): EF 50-55%, mild LVH, basal inferolateral aneurysm, normal RV size/systolic  function, loculated pleural fluid behind LA.  - Echo (1/22): EF 40-45%, basal to mid inferolateral AK, basal inferior HK, normal RV, mild aortic stenosis mean gradient 11 mmHg. - Echo (2/23): EF 45-50%, moderate LVH, normal RV, mild AS mean gradient 15 mmHg with IVC normal.  - Echo (4/23): EF 40-45%, akinesis of the inferolateral wall, basal inferolateral pseudoaneurysm, moderate RV enlargement, normal RV systolic function, mild AS with mean gradient 10 mmHg.  - Echo (6/23): EF 40-45% and pre-existing aneurysm of the basal inferolateral wall, mild-mod  AS. - RHC (1/24): mean RA 1, PA 19/10, mean PCWP 5, CI 2.66.  - Echo (2/24): EF 45-50%, basal-mid inferior/inferolateral akinesis, low normal RV function, moderate AS with mean gradient 11 and AVA 1.13 cm^2. - PYP scan (5/24): Grade 1, H/CL 1.2 => not suggestive of transthyretin cardiac amyloidosis.  - Echo (4/25): EF 45% with inferolateral akinesis and anterolateral hypokinesis, mild LV dilation, mild LVH, trileaflet aortic valve with mild AI, moderate AS with mean gradient 16 mmHg and AVA 1.0 cm^2 (DI 0.29).  11. HTN 12. Aortic stenosis: Moderate on 4/25 echo.   13. Ascending aortic aneurysm: CTA chest with 4.4 cm ascending aorta in 1/20.  - 4.2 cm ascending aorta on CT chest 6/20.  14. Atrial fibrillation: Paroxysmal.  - DCCV 11/24 15. CKD: Stage 3.  16. Left lung spontaneous PTX then recurrent left pleural effusion.  - Left VATS in 7/20.  17. Palpitations:  - Zio patch (6/20): NSR with 1 short NSVT run and few short SVT runs, no atrial fibrillation.  - Zio patch 2 wks (2/21): 6 short NSVT runs, 66 short SVT runs, rare PACs/PVCs.  - Zio patch (5/23): 2% AF burden, 4 NSVT runs longest 5 beats.  18. COVID-19 infection 4/22.  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  20. VT: Syncope in 6/23 with inducible VT. s/p dual chamber Biotronik ICD - Dyspnea with amiodarone  use.  21. Essential tremor  Current Outpatient Medications  Medication  Sig Dispense Refill   atorvastatin  (LIPITOR ) 80 MG tablet Take 1 tablet (80 mg total) by mouth daily. 30 tablet 6   cetirizine (ZYRTEC) 10 MG tablet Take 10 mg by mouth daily.     Cholecalciferol  (VITAMIN D -3) 25 MCG (1000 UT) CAPS Take 1,000 Units by mouth in the morning.     Cyanocobalamin  (VITAMIN B 12 PO) Take 1 tablet by mouth daily.     edoxaban (SAVAYSA) 30 MG TABS tablet Take 30 mg by mouth daily.     finasteride  (PROSCAR ) 5 MG tablet Take 5 mg by mouth daily.     furosemide  (LASIX ) 20 MG tablet Take 2 tablets (40 mg total) by mouth daily. 60 tablet 3   levothyroxine  (SYNTHROID ) 50 MCG tablet Take 50 mcg by mouth at bedtime.     metoprolol  succinate (TOPROL  XL) 25 MG 24 hr tablet Take 1 tablet (25 mg total) by mouth daily.     mexiletine (MEXITIL ) 150 MG capsule Take 150 mg by mouth 2 (two) times daily.     Multiple Vitamin (MULTIVITAMIN WITH MINERALS) TABS tablet Take 1 tablet by mouth in the morning.     pantoprazole  (PROTONIX ) 40 MG tablet Take 40 mg by mouth daily before breakfast.     potassium chloride  (KLOR-CON  M) 10 MEQ tablet Take 1 tablet (10 mEq total) by mouth daily. 30 tablet 3   primidone (MYSOLINE) 50 MG tablet Take 25 mg by mouth at bedtime.     spironolactone  (ALDACTONE ) 25 MG tablet Take 0.5 tablets (12.5 mg total) by mouth at bedtime. 15 tablet 2   No current facility-administered medications for this encounter.   Allergies:   Ancef [cefazolin sodium], Jardiance [empagliflozin], and Vancomycin    Social History:  The patient  reports that he quit smoking about 39 years ago. His smoking use included cigarettes. He started smoking about 74 years ago. He has a 35 pack-year smoking history. He has never used smokeless tobacco. He reports current alcohol  use of about 2.0 standard drinks of alcohol  per week. He reports that he does  not use drugs.   Family History:  The patient's family history includes Coronary artery disease in his mother; Heart disease in his mother;  Hypertension in his mother.   ROS:  Please see the history of present illness.   All other systems are personally reviewed and negative.   BP 128/70   Pulse 63   Wt 92.5 kg (204 lb)   SpO2 98%   BMI 25.50 kg/m   Wt Readings from Last 3 Encounters:  01/12/24 92.5 kg (204 lb)  11/16/23 96.2 kg (212 lb)  10/05/23 96.2 kg (212 lb)    Physical Exam: General: NAD Neck: No JVD, no thyromegaly or thyroid  nodule.  Lungs: Clear to auscultation bilaterally with normal respiratory effort. CV: Nondisplaced PMI.  Heart regular S1/S2, no S3/S4, 3/6 SEM RUSB with slightly muffled S2.  Trace ankle edema.  No carotid bruit.  Normal pedal pulses.  Abdomen: Soft, nontender, no hepatosplenomegaly, no distention.  Skin: Intact without lesions or rashes.  Neurologic: Alert and oriented x 3.  Psych: Normal affect. Extremities: No clubbing or cyanosis.  HEENT: Normal.   Assessment & Plan: 1. Syncope: 6/23, most likely cardiogenic. Known tachy-brady syndrome with outpatient monitor showing post-conversion (AF => NSR) symptomatic pauses, up to 3 sec long. Has bifascicular block.  EP study in 6/23 w/ inducible VT. Biotronik ICD placed.  2. Chronic HF with mid range EF: TEE in 7/20 showed EF 50-55%, basal inferolateral aneurysm, and normal RV.  Echo in 1/22 with EF 40-45%, basal-mid inferolateral akinesis and inferior hypokinesis, normal RV.  Echo in 6/23 showed EF 40-45% and pre-existing aneurysm of the basal inferolateral wall, mild-mod AS.  RHC in 1/24 with normal filling pressures and preserved cardiac output.  Echo in 2/24 showed EF 45-50%, basal-mid inferior/inferolateral akinesis, low normal RV function, moderate AS with mean gradient 11 and AVA 1.13 cm^2.  Workup for cardiac amyloidosis was negative (PYP scan in 5/24 not suggestive of TTR cardiac amyloidosis).  Echo today was reviewed,  EF 45% with inferolateral akinesis and anterolateral hypokinesis, mild LV dilation, mild LVH, trileaflet aortic valve with  mild AI, moderate AS with mean gradient 16 mmHg and AVA 1.0 cm^2 (DI 0.29). NYHA class II symptoms, not volume overloaded on exam.  Dyspnea improved since stopping amiodarone .  GDMT has been limited by orthostatic symptoms but this is actually improved. Suspect deconditioning and lung disease (recurrent left effusion and h/o PTX with likely scarring) play a role in his dyspnea.  - Continue Toprol  XL 25 mg daily.  - Continue Lasix  40 mg daily with BMET/BNP today.  - Continue spironolactone  12.5 mg qhs. - He is going to try to stop both midodrine  and Imdur .   - Off ARB due to orthostatic hypotension. - Unable to tolerate dapaglifozin. 3. CAD: s/p CABG 3/12. NSTEMI 1/20, LHC showed new occlusion of the branch of sequential SVG-ramus and OM that touched down on OM.  There were also serial 70% stenoses in the mid/distal RCA.  FFR of RCA was negative.  No interventional target.  Cardiolite in 12/21 showed inferolateral infarction with no ischemia, consistent with findings on NSTEMI in 1/20. Cardiolite in 3/23 showed inferior/inferolateral fixed defect with no ischemia.  LHC in 1/24 showed unchanged coronary anatomy with occluded branch of sequential SVG-ramus and OM touching down on OM, patent LIMA-LAD, occluded SVG-D, occluded SVG-PDA; no intervention. Atypical, nonexertional chest pain.  ECG without acute changes today.   - No ASA given Eliquis  use.  - Continue atorvastatin  80 daily. Check lipids today.  -  As above, I am going to stop Imdur .  - Continue Toprol  XL 25 mg daily.  4. Hyperlipidemia: Continue statin, check lipids today.  5. Spontaneous left PTX with recurrent left-sided pleural effusion:  He is s/p left VATS/pleurodesis in 7/20.    6. Carotid stenosis: S/p bilateral TCARs, followed at [facility].     7. AAA: 4.9 cm AAA in 10/23.  S/p EVAR 12/23.  Has type 2 endoleak by CTA in 1/24.  - Follow at [facility].  8. Aortic stenosis: Moderate by echo today, unchanged.   9. Atrial fibrillation: Paroxysmal. He is  significantly symptomatic while in atrial fibrillation. He has gone back off amiodarone  given pulmonary symptoms again while on it.  Not candidate for sotalol per EP.  NSR today.   - Continue edoxaban 30 mg daily. No bleeding issues. Check CBC.  10. 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  This has been stable.  11. VT: Recurrent, suspect scar in the inferolateral wall is nidus.  Has Biotronik ICD.  He had VT again in 9/23, not associated with chest pain/ischemic symptoms.  VT noted with syncopal episode 11/27/22. Has not tolerated amiodarone  due to lung toxicity and mexiletine started.  - Continue mexiletine.  - Continue Toprol  XL 25 mg daily.  12. CKD stage 3: BMET today.   Follow up 3 months with APP  I spent 31 minutes reviewing records, interviewing/examining patient, and managing orders.   Peder Bourdon  01/12/2024

## 2024-02-24 ENCOUNTER — Ambulatory Visit (INDEPENDENT_AMBULATORY_CARE_PROVIDER_SITE_OTHER): Payer: Medicare Other

## 2024-02-24 DIAGNOSIS — I5022 Chronic systolic (congestive) heart failure: Secondary | ICD-10-CM | POA: Diagnosis not present

## 2024-02-24 LAB — CUP PACEART REMOTE DEVICE CHECK
Date Time Interrogation Session: 20250604082227
Implantable Lead Connection Status: 753985
Implantable Lead Connection Status: 753985
Implantable Lead Implant Date: 20230606
Implantable Lead Implant Date: 20230606
Implantable Lead Location: 753859
Implantable Lead Location: 753860
Implantable Lead Model: 377
Implantable Lead Model: 402266
Implantable Lead Serial Number: 8000807784
Implantable Lead Serial Number: 81503989
Implantable Pulse Generator Implant Date: 20230606
Pulse Gen Model: 429534
Pulse Gen Serial Number: 84900693

## 2024-02-27 ENCOUNTER — Ambulatory Visit: Payer: Self-pay | Admitting: Cardiology

## 2024-03-15 ENCOUNTER — Telehealth (HOSPITAL_COMMUNITY): Payer: Self-pay | Admitting: Cardiology

## 2024-03-15 NOTE — Telephone Encounter (Signed)
 Patient left VM wit FYI that [facility] restarted midodrine 

## 2024-03-22 NOTE — Telephone Encounter (Signed)
 Patient returned call to report [facility] provider would like to confirm if its ok to start midodrine    Patient states [facility] provider stated in his opinion he should still be on midodrine   B/p today 117/60   Please advise

## 2024-03-22 NOTE — Telephone Encounter (Signed)
 Only reason for him to restart midodrine  would be if he were having significant orthostatic symptoms (lightheadedness with standing) AND systolic BP < 90 at times with standing.  If asymptomatic and SBP > 90, no benefit from midodrine .

## 2024-03-23 NOTE — Telephone Encounter (Signed)
 Left message to call back

## 2024-03-23 NOTE — Telephone Encounter (Signed)
 PT AWARE AND VOICED UNDERSTANDING REPORTS HE WILL NOT START AT THIS TIME

## 2024-04-11 ENCOUNTER — Telehealth (HOSPITAL_COMMUNITY): Payer: Self-pay | Admitting: *Deleted

## 2024-04-11 NOTE — Progress Notes (Signed)
 [facility] Note  Date:  04/12/2024   ID:  Eric Gordon, DOB 09/15/1931, MRN [id number]   Provider location: [place] [facility] Type of Visit: Established patient  PCP:  [name], MD  EP: [name] Cardiologist:  [name]   HPI: Eric Gordon is a 89 y.o. male with history of CAD s/p CABG.  He has had trouble long-term with exertional dyspnea.  Dyspnea triggered evaluation in 2012 leading to CABG but was not resolved by CABG.  PFTs in 3/14 showed only mild obstructive airways disease and V/Q scan showed no PE.  Echo in 9/14 showed normal LV systolic function with moderately dilated RV. RHC in 2/15 showed normal right and left heart filling pressures and normal PA pressure.  Finally, he had a CPX in 3/15 that showed normal capacity compared to age-matched sedentary norms.  He was noted to have chronotropic incompetence.  At a prior appointment, I took him off metoprolol  given chronotropic incompetence noted on CPX.  Dyspnea improved significantly with weight loss.  He had Cardiolite in 8/18 with EF 52%, no ischemia or infarction.    He was admitted in 1/20 with NSTEMI.  LHC showed new occlusion of the branch of SVG-ramus and OM that touched down on OM.  There were also serial 70% stenoses in the mid/distal RCA.  There was not thought to be an interventional option and patient was treated medically. He was discharged home but presented a couple days later with recurrent chest pain and dyspnea.  Cath was repeated showing no change from prior.  This admission, he had FFR of the RCA which did not suggest hemodynamic significance.  Echo showed EF 55-60%, normal RV.  CTA did not show a PE. He was noted to be volume overloaded and was diuresed.  He was also noted to be in atrial fibrillation with RVR transiently.  ASA/Plavix  was stopped and Eliquis  was started. He was discharged to SNF.  Atrial fibrillation recurred and he was started on amiodarone   with conversion back to NSR.   In 4/20, he was admitted with left spontaneous PTX requiring chest tube.  After this, he developed recurrent left pleural effusions requiring thoracentesis x 3 so far.  Pleural fluid was exudative with eosinophil predominance.  With elevated ESR, there was concern that amiodarone  could be involved, so this medication was stopped.  CT chest in 6/20 showed LLL consolidation/collapse with small left effusion, there was a rim-enhancing lesion posterior to the heart between esophagus and left atrium, ?loculated fluid.  TEE was done in 7/20 and confirmed loculated pleural effusion behind the left atrium.  In 7/20, patient had VATS on the left.   Carotid dopplers in 9/21 showed 80-99% RICA stenosis, 60-79% LICA.  AAA US  in 9/21 showed 4.5 cm AAA. Patient was referred to Dr. Serene for evaluation => he has had TCAR on right in 12/21.  He had left TCAR in 5/22.   Cardiolite in 12/21 with EF 41%, no ischemia, fixed inferolateral defect. Echo in 1/22 showed EF 40-45%, basal to mid inferolateral AK, basal inferior HK, normal RV, mild aortic stenosis mean gradient 11 mmHg.  He was unable to tolerate dapagliflozin  and is off it. He was lightheaded on low dose Entresto  so this was stopped.   Zio monitor in 1/23 showed 25% atrial flutter.  He was referred to EP, by the time he saw EP, burden of atrial arrhythmias was lower.   Cardiolite in 3/23 showed prior inferolateral  and inferior infarct, no ischemia.   Patient was admitted in 4/23 with atypical chest pain.  This likely was due to cough/bronchitis.  CTA chest showed inferolateral wall pseudoaneurysm, this is chronic.  Echo in 4/23 showed EF 40-45%, akinesis of the inferolateral wall, basal inferolateral pseudoaneurysm, moderate RV enlargement, normal RV systolic function, mild AS with mean gradient 10 mmHg.   Zio monitor in 5/23 showed 2% AF burden, improved from prior.     Follow up 5/23, stable NYHA II, euvolemic. Off several  meds due to orthostatic symptoms.  Admitted 6/23 with syncope, no evidence for ACS. 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 EP study with inducible VT. Underwent dual chamber Biotronik ICD placement. Discharged home, weight 208 lbs.  VT again in 9/23, treated with ATP.   Follow up 10/23, NYHA III and volume stable. GDMT limited by orthostatic symptoms and need for midodrine . Mexiletine started for recurrent VT.  S/p aortobi-iliac endograft repair of infrarenal abdominal aortic aneurysm 12/23. Type 2 endoleak noted on CTA in 1/24.   Patient was admitted in 1/24 with weakness, dyspnea, cough, atypical chest pain.  CTA chest showed not PE.  He ended up having cardiac cath, this showed no significant change to his coronary anatomy from the past.  Filling pressures were not elevated on RHC.  He was discharged home.   Echo in 2/24 showed EF 45-50%, basal-mid inferior/inferolateral akinesis, low normal RV function, moderate AS with mean gradient 11 and AVA 1.13 cm^2.   Admitted 3/24 with syncopal events, one event resulting in trauma to head and back. One event coincide with VT. Neuro consulted, suggested conservative non-op treatment, back brace for 6-8 weeks. Discharged on amiodarone  taper and off mexiletine.  Patient again tolerated amiodarone  poorly with increased dyspnea (happened last time he was on amiodarone ).  This was then stopped and mexiletine was started.   Recurrent atrial fibrillation in 11/24, s/p DCCV to NSR.   Echo 5/25 EF 45% with inferolateral akinesis and anterolateral hypokinesis, mild LV dilation, mild LVH, trileaflet aortic valve with mild AI, moderate AS with mean gradient 16 mmHg and AVA 1.0 cm^2 (DI 0.29).   Today he returns for HF follow up. Overall feeling fine. Breathing about the same, has SOB walking short distances. Uses cane for balance, no falls. He has dizziness. Continues with chronic left sided chest pain that never  resolves, left chest mildly tender No exertional symptoms. Struggling with hot flashes, becoming more frequent and occur after meals. He has to lay down then episodes pass. He checks his BP during episodes and reports it is fine. Denies abnormal bleeding, edema, or PND/Orthopnea. Appetite ok. Weight at home 194 pounds. Taking all medications.   ECG (personally reviewed): none ordered today.  Labs (5/24): K 4.5, creatinine 1.89, urine immunofixation negative, myeloma panel negative Labs (6/24): K 5.0, creatinine 1.67, LDL 73 Labs (11/24): hgb 13.8, K 4.2, creatinine 1.98 Labs (4/25): K 4.5, creatinine 1.92, LDL 60   PMH: 1. Essential tremor 2. CAD: s/p CABG in 3/12.   - Cardiolite (8/18): EF 52%, no ischemia/infarction.  - NSTEMI (1/20):  LHC showed new occlusion of the branch of SVG-ramus and OM that touched down on OM.  There were also serial 70% stenoses in the mid/distal RCA.  FFR of RCA was negative.  - Cardiolite (12/21): EF 41%, no ischemia, fixed inferolateral defect - Cardiolite (3/23): LV EF 37%, fixed basal-mid inferolateral/inferior defect, no ischemia.  - LHC (1/24): Totally occluded SVG-PDA, occlusion  of the branch of sequential SVG-ramus and OM that touches down on OM but patent ramus branch, occluded SVG-D, patent LIMA-LAD. Similar to prior, no intervention.  3. Atrial fibrillation: Only noted post-op CABG.  4. PE: Post-op CABG in 2012.  5. AAA: 3.6 cm on US  in 3/14.  3.6 cm on US  in 3/15. 3.6 cm on US  8/16. 4.1 cm on US  4/17.  - AAA US  (5/18): 4.1 cm AAA.  - AAA US  (5/19): 4.1 cm AAA.  - AAA US  (4/20): 4.1 cm AAA.  - AAA US  (9/21): 4.5 cm AAA - AAA US  (9/22): 4.5 cm AAA - AAA US  (10/23): 4.9 cm AAA - s/p EVAR 12/23, type 2 endoleak noted on 1/24 CTA.  6. OSA: On CPAP.  7. Carotid stenosis: Carotid dopplers (9/14) with 60-79% LICA stenosis.  Carotid dopplers (9/15) with 60-79% LICA stenosis. Carotid dopplers (3/16) with 60-79% RCIA stenosis, 40-59% LICA stenosis.  -  Carotid dopplers (8/16) with 40-59% RICA stenosis, 60-79% LICA stenosis.  - Carotid dopplers (8/17) with 60-79% RICA, 40-59% LICA. - Carotid dopplers (9/18) with 40-59% RICA, 60-79% LICA.   - Carotid dopplers (9/19) with 40-59% RICA, 60-79% LICA.  - Carotid dopplers (9/20) with 60-79% BICA - Carotid dopplers (9/21): 80-99% RICA, 60-79% LICA - Right TCAR 12/21, left TCAR 5/22.  - Carotid dopplers (4/23): patent ICA stents.  - Carotid dopplers (2/25): No in-stent restenosis.  8. Asthma 9. PNA x 2 10. Chronic diastolic CHF/dyspnea: Echo (9/14) with EF 60-65%, mild LVH, very mild AS with mean gradient 10 mmHg, RV moderately dilated.  PFTs (3/14) showed mild obstructive airways disease.  V/Q scan (3/14) with no PE.  ENT workup for upper respiratory causes was negative.  RHC (2/15) with mean RA 7, PA 32/12, mean PCWP 13, CI 3.18.  CPX (3/15) with peak VO2 16.4, VE/VCO2 33; normal when compared to age-matched sedentary normals; chronotropic incompetence was noted.  Dyspnea was improved with weight loss.  - Echo (8/17): EF 60-65%, mild AS.  - Echo (1/20): EF 55-60%, normal RV size and systolic function.  - Echo (6/20): EF 55%, mild LVH, mild AS, normal RV size and systolic function, ?LA mass or mass impinging on posterior LA.  - TEE (7/20): EF 50-55%, mild LVH, basal inferolateral aneurysm, normal RV size/systolic function, loculated pleural fluid behind LA.  - Echo (1/22): EF 40-45%, basal to mid inferolateral AK, basal inferior HK, normal RV, mild aortic stenosis mean gradient 11 mmHg. - Echo (2/23): EF 45-50%, moderate LVH, normal RV, mild AS mean gradient 15 mmHg with IVC normal.  - Echo (4/23): EF 40-45%, akinesis of the inferolateral wall, basal inferolateral pseudoaneurysm, moderate RV enlargement, normal RV systolic function, mild AS with mean gradient 10 mmHg.  - Echo (6/23): EF 40-45% and pre-existing aneurysm of the basal inferolateral wall, mild-mod AS. - RHC (1/24): mean RA 1, PA 19/10, mean  PCWP 5, CI 2.66.  - Echo (2/24): EF 45-50%, basal-mid inferior/inferolateral akinesis, low normal RV function, moderate AS with mean gradient 11 and AVA 1.13 cm^2. - PYP scan (5/24): Grade 1, H/CL 1.2 => not suggestive of transthyretin cardiac amyloidosis.  - Echo (4/25): EF 45% with inferolateral akinesis and anterolateral hypokinesis, mild LV dilation, mild LVH, trileaflet aortic valve with mild AI, moderate AS with mean gradient 16 mmHg and AVA 1.0 cm^2 (DI 0.29).  11. HTN 12. Aortic stenosis: Moderate on 4/25 echo.   13. Ascending aortic aneurysm: CTA chest with 4.4 cm ascending aorta in 1/20.  - 4.2 cm  ascending aorta on CT chest 6/20.  14. Atrial fibrillation: Paroxysmal.  - DCCV 11/24 15. CKD: Stage 3.  16. Left lung spontaneous PTX then recurrent left pleural effusion.  - Left VATS in 7/20.  17. Palpitations:  - Zio patch (6/20): NSR with 1 short NSVT run and few short SVT runs, no atrial fibrillation.  - Zio patch 2 wks (2/21): 6 short NSVT runs, 66 short SVT runs, rare PACs/PVCs.  - Zio patch (5/23): 2% AF burden, 4 NSVT runs longest 5 beats.  18. COVID-19 infection 4/22.  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  20. VT: Syncope in 6/23 with inducible VT. s/p dual chamber Biotronik ICD - Dyspnea with amiodarone  use.  21. Essential tremor  Current Outpatient Medications  Medication Sig Dispense Refill   cetirizine (ZYRTEC) 10 MG tablet Take 10 mg by mouth daily.     Cholecalciferol  (VITAMIN D -3) 25 MCG (1000 UT) CAPS Take 1,000 Units by mouth in the morning.     Cyanocobalamin  (VITAMIN B 12 PO) Take 1 tablet by mouth daily.     edoxaban (SAVAYSA) 30 MG TABS tablet Take 30 mg by mouth daily.     finasteride  (PROSCAR ) 5 MG tablet Take 5 mg by mouth daily.     furosemide  (LASIX ) 20 MG tablet Take 2 tablets (40 mg total) by mouth daily. 60 tablet 3   levothyroxine  (SYNTHROID ) 50 MCG tablet Take 50 mcg by mouth at bedtime.     metoprolol  succinate (TOPROL  XL) 25 MG 24 hr  tablet Take 1 tablet (25 mg total) by mouth daily.     mexiletine (MEXITIL ) 150 MG capsule Take 150 mg by mouth 2 (two) times daily.     Multiple Vitamin (MULTIVITAMIN WITH MINERALS) TABS tablet Take 1 tablet by mouth in the morning.     pantoprazole  (PROTONIX ) 40 MG tablet Take 40 mg by mouth daily before breakfast.     potassium chloride  (KLOR-CON  M) 10 MEQ tablet Take 1 tablet (10 mEq total) by mouth daily. 30 tablet 3   primidone (MYSOLINE) 50 MG tablet Take 25 mg by mouth at bedtime.     spironolactone  (ALDACTONE ) 25 MG tablet Take 0.5 tablets (12.5 mg total) by mouth at bedtime. 15 tablet 2   atorvastatin  (LIPITOR ) 80 MG tablet Take 1 tablet (80 mg total) by mouth daily. 30 tablet 6   No current facility-administered medications for this encounter.   Allergies:   Ancef [cefazolin sodium], Jardiance [empagliflozin], and Vancomycin    Social History:  The patient  reports that he quit smoking about 40 years ago. His smoking use included cigarettes. He started smoking about 75 years ago. He has a 35 pack-year smoking history. He has never used smokeless tobacco. He reports current alcohol  use of about 2.0 standard drinks of alcohol  per week. He reports that he does not use drugs.   Family History:  The patient's family history includes Coronary artery disease in his mother; Heart disease in his mother; Hypertension in his mother.   ROS:  Please see the history of present illness.   All other systems are personally reviewed and negative.   BP 118/78   Pulse 89   Ht 6' 3 (1.905 m)   Wt 89.6 kg (197 lb 9.6 oz)   SpO2 96%   BMI 24.70 kg/m   Wt Readings from Last 3 Encounters:  04/12/24 89.6 kg (197 lb 9.6 oz)  01/12/24 92.5 kg (204 lb)  11/16/23 96.2 kg (212 lb)    Physical Exam: General:  NAD. No resp difficulty, walked into clinic with cane, appears younger than stated age. HEENT: Normal Neck: Supple. No JVD. Cor: Regular rate & rhythm. No rubs, gallops, 3/6 SEM RUSB Lungs:  Clear Abdomen: Soft, nontender, nondistended.  Extremities: No cyanosis, clubbing, rash, edema Neuro: Alert & oriented x 3, moves all 4 extremities w/o difficulty. Affect pleasant.  Assessment & Plan: 1. Syncope: 6/23, most likely cardiogenic. Known tachy-brady syndrome with outpatient monitor showing post-conversion (AF => NSR) symptomatic pauses, up to 3 sec long. Has bifascicular block.  EP study in 6/23 w/ inducible VT.  - Biotronik ICD placed.  2. Chronic HF with mid range EF: TEE in 7/20 showed EF 50-55%, basal inferolateral aneurysm, and normal RV.  Echo in 1/22 with EF 40-45%, basal-mid inferolateral akinesis and inferior hypokinesis, normal RV.  Echo in 6/23 showed EF 40-45% and pre-existing aneurysm of the basal inferolateral wall, mild-mod AS.  RHC in 1/24 with normal filling pressures and preserved cardiac output.  Echo in 2/24 showed EF 45-50%, basal-mid inferior/inferolateral akinesis, low normal RV function, moderate AS with mean gradient 11 and AVA 1.13 cm^2.  Workup for cardiac amyloidosis was negative (PYP scan in 5/24 not suggestive of TTR cardiac amyloidosis).  Echo 5/25 EF 45% with inferolateral akinesis and anterolateral hypokinesis, mild LV dilation, mild LVH, trileaflet aortic valve with mild AI, moderate AS with mean gradient 16 mmHg and AVA 1.0 cm^2 (DI 0.29). NYHA class II-early III symptoms, not volume overloaded on exam.  Dyspnea improved since stopping amiodarone .  GDMT has been limited by orthostatic symptoms but this is actually improved. Suspect deconditioning and lung disease (recurrent left effusion and h/o PTX with likely scarring) play a role in his dyspnea.  - Continue Toprol  XL 25 mg daily.  - Continue Lasix  40 mg daily + 10 KCL daily. BMET and BNP today. - Continue spironolactone  12.5 mg qhs. - Off midodrine  and Imdur . - Off ARB due to orthostatic hypotension. - Unable to tolerate dapaglifozin. 3. CAD: s/p CABG 3/12. NSTEMI 1/20, LHC showed new occlusion of the  branch of sequential SVG-ramus and OM that touched down on OM.  There were also serial 70% stenoses in the mid/distal RCA.  FFR of RCA was negative.  No interventional target.  Cardiolite in 12/21 showed inferolateral infarction with no ischemia, consistent with findings on NSTEMI in 1/20. Cardiolite in 3/23 showed inferior/inferolateral fixed defect with no ischemia.  LHC in 1/24 showed unchanged coronary anatomy with occluded branch of sequential SVG-ramus and OM touching down on OM, patent LIMA-LAD, occluded SVG-D, occluded SVG-PDA; no intervention. Atypical, nonexertional chest pain. No change to chronic pattern. - No ASA given Eliquis  use.  - Continue atorvastatin  80 daily.  - Continue Toprol  XL 25 mg daily.  - No longer on Imdur . 4. Hyperlipidemia: Continue statin, good lipids 5/25. 5. Spontaneous left PTX with recurrent left-sided pleural effusion:  He is s/p left VATS/pleurodesis in 7/20.    6. Carotid stenosis: S/p bilateral TCARs, followed at [facility].     7. AAA: 4.9 cm AAA in 10/23.  S/p EVAR 12/23.  Has type 2 endoleak by CTA in 1/24.  - Follow at [facility].  8. Aortic stenosis: Moderate by most recent echo, unchanged.   9. Atrial fibrillation: Paroxysmal. He is significantly symptomatic while in atrial fibrillation. He has gone back off amiodarone  given pulmonary symptoms again while on it.  Not candidate for sotalol per EP.  Regular on exam today. - Continue edoxaban 30 mg daily. No bleeding issues. Recent CBC (01/12/24)  reviewed and stable. 10. 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  This has been stable.  11. VT: Recurrent, suspect scar in the inferolateral wall is nidus.  Has Biotronik ICD.  He had VT again in 9/23, not associated with chest pain/ischemic symptoms.  VT noted with syncopal episode 11/27/22. Has not tolerated amiodarone  due to lung toxicity and mexiletine started.  - Continue mexiletine 150 mg bid.  - Continue Toprol  XL 25 mg daily.  12. CKD  stage 3: BMET today.  13. Heat intolerance: Frequent hot flashes, unclear etiology. ? Post prandial hypotension or thyroid  dysfunction. I asked him to follow up with his PCP. - Check labs and TSH today.   Follow up in 4 months with [name] Raisin [facility] FNP-BC 04/12/2024

## 2024-04-11 NOTE — Telephone Encounter (Signed)
 Called to confirm/remind patient of their appointment at the [facility] on 04/12/24.       Appointment:              [x] Confirmed             [] Left mess              [] No answer/No voice mail             [] Phone not in service   Patient reminded to bring all medications and/or complete list.   Confirmed patient has transportation. Gave directions, instructed to utilize valet parking.

## 2024-04-12 ENCOUNTER — Ambulatory Visit (HOSPITAL_COMMUNITY)
Admission: RE | Admit: 2024-04-12 | Discharge: 2024-04-12 | Disposition: A | Discharge status: Home / Self Care | Source: Ambulatory Visit | Attending: Family Medicine | Admitting: Family Medicine

## 2024-04-12 ENCOUNTER — Encounter (HOSPITAL_COMMUNITY): Payer: Self-pay

## 2024-04-12 ENCOUNTER — Ambulatory Visit (HOSPITAL_COMMUNITY): Payer: Self-pay | Admitting: Family Medicine

## 2024-04-12 VITALS — BP 118/78 | HR 89 | Ht 75.0 in | Wt 197.6 lb

## 2024-04-12 DIAGNOSIS — I714 Abdominal aortic aneurysm, without rupture, unspecified: Secondary | ICD-10-CM | POA: Diagnosis not present

## 2024-04-12 DIAGNOSIS — N183 Chronic kidney disease, stage 3 unspecified: Secondary | ICD-10-CM | POA: Insufficient documentation

## 2024-04-12 DIAGNOSIS — R0789 Other chest pain: Secondary | ICD-10-CM | POA: Insufficient documentation

## 2024-04-12 DIAGNOSIS — R232 Flushing: Secondary | ICD-10-CM | POA: Insufficient documentation

## 2024-04-12 DIAGNOSIS — I252 Old myocardial infarction: Secondary | ICD-10-CM | POA: Insufficient documentation

## 2024-04-12 DIAGNOSIS — I495 Sick sinus syndrome: Secondary | ICD-10-CM | POA: Diagnosis not present

## 2024-04-12 DIAGNOSIS — I472 Ventricular tachycardia, unspecified: Secondary | ICD-10-CM

## 2024-04-12 DIAGNOSIS — I480 Paroxysmal atrial fibrillation: Secondary | ICD-10-CM | POA: Diagnosis not present

## 2024-04-12 DIAGNOSIS — E785 Hyperlipidemia, unspecified: Secondary | ICD-10-CM | POA: Insufficient documentation

## 2024-04-12 DIAGNOSIS — Z79899 Other long term (current) drug therapy: Secondary | ICD-10-CM | POA: Insufficient documentation

## 2024-04-12 DIAGNOSIS — I253 Aneurysm of heart: Secondary | ICD-10-CM | POA: Diagnosis not present

## 2024-04-12 DIAGNOSIS — G8929 Other chronic pain: Secondary | ICD-10-CM | POA: Insufficient documentation

## 2024-04-12 DIAGNOSIS — J90 Pleural effusion, not elsewhere classified: Secondary | ICD-10-CM | POA: Diagnosis not present

## 2024-04-12 DIAGNOSIS — I2510 Atherosclerotic heart disease of native coronary artery without angina pectoris: Secondary | ICD-10-CM | POA: Insufficient documentation

## 2024-04-12 DIAGNOSIS — Z7901 Long term (current) use of anticoagulants: Secondary | ICD-10-CM | POA: Diagnosis not present

## 2024-04-12 DIAGNOSIS — I452 Bifascicular block: Secondary | ICD-10-CM | POA: Diagnosis not present

## 2024-04-12 DIAGNOSIS — I5022 Chronic systolic (congestive) heart failure: Secondary | ICD-10-CM | POA: Insufficient documentation

## 2024-04-12 DIAGNOSIS — Z95810 Presence of automatic (implantable) cardiac defibrillator: Secondary | ICD-10-CM | POA: Diagnosis not present

## 2024-04-12 DIAGNOSIS — I729 Aneurysm of unspecified site: Secondary | ICD-10-CM

## 2024-04-12 DIAGNOSIS — I350 Nonrheumatic aortic (valve) stenosis: Secondary | ICD-10-CM | POA: Insufficient documentation

## 2024-04-12 DIAGNOSIS — I6529 Occlusion and stenosis of unspecified carotid artery: Secondary | ICD-10-CM | POA: Diagnosis not present

## 2024-04-12 DIAGNOSIS — Z951 Presence of aortocoronary bypass graft: Secondary | ICD-10-CM | POA: Diagnosis not present

## 2024-04-12 DIAGNOSIS — Z87891 Personal history of nicotine dependence: Secondary | ICD-10-CM | POA: Diagnosis not present

## 2024-04-12 LAB — BASIC METABOLIC PANEL WITH GFR
Anion gap: 12 (ref 5–15)
BUN: 41 mg/dL — ABNORMAL HIGH (ref 8–23)
CO2: 27 mmol/L (ref 22–32)
Calcium: 9.9 mg/dL (ref 8.9–10.3)
Chloride: 103 mmol/L (ref 98–111)
Creatinine, Ser: 1.76 mg/dL — ABNORMAL HIGH (ref 0.61–1.24)
GFR, Estimated: 36 mL/min — ABNORMAL LOW (ref >60)
Glucose, Bld: 101 mg/dL — ABNORMAL HIGH (ref 70–99)
Potassium: 4.8 mmol/L (ref 3.5–5.1)
Sodium: 142 mmol/L (ref 135–145)

## 2024-04-12 LAB — BRAIN NATRIURETIC PEPTIDE: B Natriuretic Peptide: 323.5 pg/mL — ABNORMAL HIGH (ref 0.0–100.0)

## 2024-04-12 LAB — TSH: TSH: 2.842 u[IU]/mL (ref 0.350–4.500)

## 2024-04-12 NOTE — Patient Instructions (Addendum)
 Good to see you today!  Labs done today, your results will be available in MyChart, we will contact you for abnormal readings.  Your physician recommends that you schedule a follow-up appointment 4 months(November) call office in September to schedule an appointment  If you have any questions or concerns before your next appointment please send us  a message through [place] or call our office at [phone].    TO LEAVE A MESSAGE FOR THE NURSE SELECT OPTION 2, PLEASE LEAVE A MESSAGE INCLUDING: YOUR NAME DATE OF BIRTH CALL BACK NUMBER REASON FOR CALL**this is important as we prioritize the call backs  YOU WILL RECEIVE A CALL BACK THE SAME DAY AS LONG AS YOU CALL BEFORE 4:00 PM At the [facility], you and your health needs are our priority. As part of our continuing mission to provide you with exceptional heart care, we have created designated Provider Care Teams. These Care Teams include your primary Cardiologist (physician) and Advanced Practice Providers (APPs- Physician Assistants and Nurse Practitioners) who all work together to provide you with the care you need, when you need it.   You may see any of the following providers on your designated Care Team at your next follow up: Dr Toribio Fuel Dr Ezra Shuck Dr. Ria Commander Dr. Morene Brownie Amy Lenetta, NP Caffie Shed, [place] [facility] [place], [place] Beckey Coe, NP [place] Lee, NP Ellouise Class, NP Tinnie Redman, PharmD Jaun Bash, PharmD   Please be sure to bring in all your medications bottles to every appointment.    Thank you for choosing [facility]

## 2024-04-14 NOTE — Progress Notes (Signed)
 Remote ICD transmission.

## 2024-05-25 ENCOUNTER — Ambulatory Visit: Payer: Medicare Other

## 2024-05-25 DIAGNOSIS — I480 Paroxysmal atrial fibrillation: Secondary | ICD-10-CM

## 2024-05-26 LAB — CUP PACEART REMOTE DEVICE CHECK
Date Time Interrogation Session: 20250904120731
Implantable Lead Connection Status: 753985
Implantable Lead Connection Status: 753985
Implantable Lead Implant Date: 20230606
Implantable Lead Implant Date: 20230606
Implantable Lead Location: 753859
Implantable Lead Location: 753860
Implantable Lead Model: 377
Implantable Lead Model: 402266
Implantable Lead Serial Number: 8000807784
Implantable Lead Serial Number: 81503989
Implantable Pulse Generator Implant Date: 20230606
Pulse Gen Model: 429534
Pulse Gen Serial Number: 84900693

## 2024-05-28 ENCOUNTER — Ambulatory Visit: Payer: Self-pay | Admitting: Cardiology

## 2024-06-04 NOTE — Progress Notes (Signed)
Remote ICD Transmission.

## 2024-06-06 ENCOUNTER — Emergency Department (HOSPITAL_COMMUNITY)

## 2024-06-06 ENCOUNTER — Inpatient Hospital Stay (HOSPITAL_COMMUNITY)
Admission: EM | Admit: 2024-06-06 | Discharge: 2024-06-08 | DRG: 193 | Disposition: A | Discharge status: Home / Self Care | Attending: Student | Admitting: Student

## 2024-06-06 ENCOUNTER — Other Ambulatory Visit: Payer: Self-pay

## 2024-06-06 DIAGNOSIS — K219 Gastro-esophageal reflux disease without esophagitis: Secondary | ICD-10-CM | POA: Diagnosis present

## 2024-06-06 DIAGNOSIS — E785 Hyperlipidemia, unspecified: Secondary | ICD-10-CM | POA: Diagnosis present

## 2024-06-06 DIAGNOSIS — R531 Weakness: Secondary | ICD-10-CM | POA: Diagnosis not present

## 2024-06-06 DIAGNOSIS — Z881 Allergy status to other antibiotic agents status: Secondary | ICD-10-CM

## 2024-06-06 DIAGNOSIS — B9789 Other viral agents as the cause of diseases classified elsewhere: Secondary | ICD-10-CM | POA: Diagnosis present

## 2024-06-06 DIAGNOSIS — R1312 Dysphagia, oropharyngeal phase: Secondary | ICD-10-CM | POA: Diagnosis present

## 2024-06-06 DIAGNOSIS — I252 Old myocardial infarction: Secondary | ICD-10-CM

## 2024-06-06 DIAGNOSIS — Z7901 Long term (current) use of anticoagulants: Secondary | ICD-10-CM | POA: Diagnosis not present

## 2024-06-06 DIAGNOSIS — I5032 Chronic diastolic (congestive) heart failure: Secondary | ICD-10-CM | POA: Diagnosis present

## 2024-06-06 DIAGNOSIS — R0902 Hypoxemia: Secondary | ICD-10-CM | POA: Diagnosis present

## 2024-06-06 DIAGNOSIS — J189 Pneumonia, unspecified organism: Secondary | ICD-10-CM | POA: Diagnosis present

## 2024-06-06 DIAGNOSIS — J45909 Unspecified asthma, uncomplicated: Secondary | ICD-10-CM | POA: Diagnosis present

## 2024-06-06 DIAGNOSIS — I97631 Postprocedural hematoma of a circulatory system organ or structure following cardiac bypass: Secondary | ICD-10-CM | POA: Diagnosis not present

## 2024-06-06 DIAGNOSIS — J9811 Atelectasis: Secondary | ICD-10-CM | POA: Diagnosis present

## 2024-06-06 DIAGNOSIS — Z87891 Personal history of nicotine dependence: Secondary | ICD-10-CM | POA: Diagnosis not present

## 2024-06-06 DIAGNOSIS — Z79899 Other long term (current) drug therapy: Secondary | ICD-10-CM

## 2024-06-06 DIAGNOSIS — I253 Aneurysm of heart: Secondary | ICD-10-CM | POA: Diagnosis present

## 2024-06-06 DIAGNOSIS — K5730 Diverticulosis of large intestine without perforation or abscess without bleeding: Secondary | ICD-10-CM | POA: Diagnosis not present

## 2024-06-06 DIAGNOSIS — Z95810 Presence of automatic (implantable) cardiac defibrillator: Secondary | ICD-10-CM

## 2024-06-06 DIAGNOSIS — J1289 Other viral pneumonia: Principal | ICD-10-CM | POA: Diagnosis present

## 2024-06-06 DIAGNOSIS — Z86711 Personal history of pulmonary embolism: Secondary | ICD-10-CM

## 2024-06-06 DIAGNOSIS — R079 Chest pain, unspecified: Secondary | ICD-10-CM | POA: Diagnosis not present

## 2024-06-06 DIAGNOSIS — Z79890 Hormone replacement therapy (postmenopausal): Secondary | ICD-10-CM | POA: Diagnosis not present

## 2024-06-06 DIAGNOSIS — G250 Essential tremor: Secondary | ICD-10-CM | POA: Diagnosis present

## 2024-06-06 DIAGNOSIS — Z8249 Family history of ischemic heart disease and other diseases of the circulatory system: Secondary | ICD-10-CM

## 2024-06-06 DIAGNOSIS — I130 Hypertensive heart and chronic kidney disease with heart failure and stage 1 through stage 4 chronic kidney disease, or unspecified chronic kidney disease: Secondary | ICD-10-CM | POA: Diagnosis present

## 2024-06-06 DIAGNOSIS — I25810 Atherosclerosis of coronary artery bypass graft(s) without angina pectoris: Secondary | ICD-10-CM | POA: Diagnosis present

## 2024-06-06 DIAGNOSIS — J9601 Acute respiratory failure with hypoxia: Secondary | ICD-10-CM | POA: Diagnosis not present

## 2024-06-06 DIAGNOSIS — N281 Cyst of kidney, acquired: Secondary | ICD-10-CM | POA: Diagnosis not present

## 2024-06-06 DIAGNOSIS — N183 Chronic kidney disease, stage 3 unspecified: Secondary | ICD-10-CM | POA: Diagnosis present

## 2024-06-06 DIAGNOSIS — B348 Other viral infections of unspecified site: Secondary | ICD-10-CM | POA: Diagnosis not present

## 2024-06-06 DIAGNOSIS — E039 Hypothyroidism, unspecified: Secondary | ICD-10-CM | POA: Diagnosis present

## 2024-06-06 DIAGNOSIS — Z888 Allergy status to other drugs, medicaments and biological substances status: Secondary | ICD-10-CM

## 2024-06-06 DIAGNOSIS — I480 Paroxysmal atrial fibrillation: Secondary | ICD-10-CM | POA: Diagnosis present

## 2024-06-06 DIAGNOSIS — G4733 Obstructive sleep apnea (adult) (pediatric): Secondary | ICD-10-CM | POA: Diagnosis present

## 2024-06-06 DIAGNOSIS — N400 Enlarged prostate without lower urinary tract symptoms: Secondary | ICD-10-CM | POA: Diagnosis present

## 2024-06-06 DIAGNOSIS — J90 Pleural effusion, not elsewhere classified: Secondary | ICD-10-CM | POA: Diagnosis not present

## 2024-06-06 DIAGNOSIS — M549 Dorsalgia, unspecified: Secondary | ICD-10-CM | POA: Diagnosis not present

## 2024-06-06 DIAGNOSIS — J690 Pneumonitis due to inhalation of food and vomit: Principal | ICD-10-CM | POA: Diagnosis present

## 2024-06-06 DIAGNOSIS — J168 Pneumonia due to other specified infectious organisms: Secondary | ICD-10-CM | POA: Diagnosis not present

## 2024-06-06 DIAGNOSIS — I2510 Atherosclerotic heart disease of native coronary artery without angina pectoris: Secondary | ICD-10-CM | POA: Diagnosis present

## 2024-06-06 LAB — BASIC METABOLIC PANEL WITH GFR
Anion gap: 13 (ref 5–15)
BUN: 28 mg/dL — ABNORMAL HIGH (ref 8–23)
CO2: 24 mmol/L (ref 22–32)
Calcium: 9.5 mg/dL (ref 8.9–10.3)
Chloride: 98 mmol/L (ref 98–111)
Creatinine, Ser: 1.74 mg/dL — ABNORMAL HIGH (ref 0.61–1.24)
GFR, Estimated: 37 mL/min — ABNORMAL LOW (ref >60)
Glucose, Bld: 102 mg/dL — ABNORMAL HIGH (ref 70–99)
Potassium: 4.3 mmol/L (ref 3.5–5.1)
Sodium: 135 mmol/L (ref 135–145)

## 2024-06-06 LAB — CBC
HCT: 42.4 % (ref 39.0–52.0)
Hemoglobin: 13.2 g/dL (ref 13.0–17.0)
MCH: 30.3 pg (ref 26.0–34.0)
MCHC: 31.1 g/dL (ref 30.0–36.0)
MCV: 97.5 fL (ref 80.0–100.0)
Platelets: 166 K/uL (ref 150–400)
RBC: 4.35 MIL/uL (ref 4.22–5.81)
RDW: 14.8 % (ref 11.5–15.5)
WBC: 10.8 K/uL — ABNORMAL HIGH (ref 4.0–10.5)
nRBC: 0 % (ref 0.0–0.2)

## 2024-06-06 LAB — TROPONIN I (HIGH SENSITIVITY)
Troponin I (High Sensitivity): 18 ng/L — ABNORMAL HIGH (ref <18)
Troponin I (High Sensitivity): 18 ng/L — ABNORMAL HIGH (ref <18)

## 2024-06-06 LAB — RESP PANEL BY RT-PCR (RSV, FLU A&B, COVID)  RVPGX2
Influenza A by PCR: NEGATIVE
Influenza B by PCR: NEGATIVE
Resp Syncytial Virus by PCR: NEGATIVE
SARS Coronavirus 2 by RT PCR: NEGATIVE

## 2024-06-06 LAB — BRAIN NATRIURETIC PEPTIDE: B Natriuretic Peptide: 329.1 pg/mL — ABNORMAL HIGH (ref 0.0–100.0)

## 2024-06-06 MED ORDER — LEVOFLOXACIN IN D5W 750 MG/150ML IV SOLN: 750.0000 mg | Freq: Once | INTRAVENOUS | Status: DC

## 2024-06-06 MED ORDER — IOHEXOL 350 MG/ML SOLN
75.0000 mL | Freq: Once | INTRAVENOUS | Status: AC | PRN
  Administered 2024-06-06: 75 mL via INTRAVENOUS

## 2024-06-06 NOTE — ED Notes (Signed)
 Pt desat to 88%. Placed on 2L [place] with improvement to 95%. MD Dionisio notified

## 2024-06-06 NOTE — ED Provider Notes (Signed)
 [place] EMERGENCY DEPARTMENT AT  HOSPITAL Provider Note HPI Eric Gordon is a 89 y.o. male with a PMH notable for CAD status post CABG, CHF c/b VT s/p ICD on mexilitine, A-fib on Eliquis , CKD, HLD, HTN, AAA status post repair who presents emergency department with 1 week of cough, congestion, chest pain, shortness of breath, weakness and a sore throat.  Denies fevers, abdominal pain, vomiting, diarrhea.  Past Medical History:  Diagnosis Date   Abdominal aortic aneurysm (AAA) (HCC)    Achilles tendinitis of right lower extremity    AICD (automatic cardioverter/defibrillator) present    Arthritis    my whole body (03/19/2018)   Atherosclerosis of coronary artery bypass graft w/o angina pectoris    Atrial fibrillation (HCC)    Barrett's esophagus    CAD (coronary artery disease)    Carotid artery disease (HCC)    CHF (congestive heart failure) (HCC)    Chronic anticoagulation    PE   Chronic lower back pain    Dyspnea    Dysrhythmia    Essential tremor    GERD (gastroesophageal reflux disease)    High cholesterol    Hyperlipidemia    Hyperplasia, prostate    Hypertension    Hypothyroidism    Myocardial infarction (HCC)    2 in Jan. 2019   Nail dystrophy    OSA on CPAP    uses CPAP   Osteoarthrosis    Pneumonia    now and once before (03/19/2018)   Pulmonary embolism [facility])    April 2012 after CABG   S/P CABG (coronary artery bypass graft)    Sleep apnea    Spontaneous pneumothorax    left spontaneous pneumothorax/left pleural effusion, s/p chest tube 01/09/19; thoracentesis x2, s/p left VATS/tacl pleurodesis 04/12/19   Past Surgical History:  Procedure Laterality Date   ABDOMINAL AORTIC ENDOVASCULAR STENT GRAFT N/A 08/06/2022   Procedure: ABDOMINAL AORTIC ENDOVASCULAR STENT GRAFT;  Surgeon: Serene Gaile ORN, MD;  [facility];  Service: Vascular;  Laterality: N/A;   ACHILLES TENDON REPAIR Bilateral    2006 & 2004   CARDIAC CATHETERIZATION   11/2010   CARDIOVERSION N/A 08/03/2023   Procedure: CARDIOVERSION (CATH LAB);  Surgeon: Rolan Ezra RAMAN, MD;  Location: [facility] INVASIVE CV LAB;  Service: Cardiovascular;  Laterality: N/A;   CORONARY ANGIOGRAPHY N/A 10/22/2018   Procedure: CORONARY ANGIOGRAPHY;  Surgeon: Burnard Debby LABOR, MD;  [facility]: [facility] INVASIVE CV LAB;  Service: Cardiovascular;  Laterality: N/A;   CORONARY ARTERY BYPASS GRAFT  March 2012   CABG X 5   CORONARY PRESSURE/FFR STUDY N/A 10/22/2018   Procedure: INTRAVASCULAR PRESSURE WIRE/FFR STUDY;  Surgeon: Burnard Debby LABOR, MD;  [facility] INVASIVE CV LAB;  Service: Cardiovascular;  Laterality: N/A;   ESOPHAGOGASTRODUODENOSCOPY (EGD) WITH PROPOFOL  N/A 12/10/2020   Procedure: ESOPHAGOGASTRODUODENOSCOPY (EGD) WITH PROPOFOL ;  Surgeon: Legrand Victory LITTIE DOUGLAS, MD;  Location: [facility] ENDOSCOPY;  Service: Gastroenterology;  Laterality: N/A;   ICD IMPLANT N/A 02/25/2022   Procedure: ICD IMPLANT;  Surgeon: Cindie Ole DASEN, MD;  Location: [facility] INVASIVE CV LAB;  Service: Cardiovascular;  Laterality: N/A;   INGUINAL HERNIA REPAIR Left 1980   IR THORACENTESIS ASP PLEURAL SPACE W/IMG GUIDE  02/04/2019   IR THORACENTESIS ASP PLEURAL SPACE W/IMG GUIDE  02/24/2019   KNEE ARTHROSCOPY Left 2006   LEFT HEART CATH AND CORS/GRAFTS ANGIOGRAPHY N/A 10/18/2018   Procedure: LEFT HEART CATH AND CORS/GRAFTS ANGIOGRAPHY;  Surgeon: Court Dorn PARAS, MD;  [facility] INVAS[facility] CV LAB;  Service: Cardiovascular;  Laterality: N/A;   LEFT HEART CATH AND CORS/GRAFTS ANGIOGRAPHY N/A 10/21/2018   Procedure: LEFT HEART CATH AND CORS/GRAFTS ANGIOGRAPHY;  Surgeon: Burnard Debby LABOR, MD;  [facility] INVASIVE CV LAB;  Service: Cardiovascular;  Laterality: N/A;   PACEMAKER IMPLANT N/A 02/24/2022   Procedure: PACEMAKER IMPLANT;  Surgeon: Fernande Elspeth BROCKS, MD;  Location: [facility] INVASIVE CV LAB;  Service: Cardiovascular;  Laterality: N/A;   PENILE PROSTHESIS IMPLANT     PLEURAL BIOPSY Left 04/12/2019   Procedure: Pleural Biopsy;  Surgeon: Army Dallas NOVAK, MD;  Location: [facility] OR;  Service: Thoracic;  Laterality: Left;   PLEURAL EFFUSION DRAINAGE Left 04/12/2019   Procedure: Drainage Of Pleural Effusion;  Surgeon: Army Dallas NOVAK, MD;  Location: [facility] OR;  Service: Thoracic;  Laterality: Left;   RIGHT HEART CATHETERIZATION N/A 11/14/2013   Procedure: RIGHT HEART CATH;  Surgeon: Ezra GORMAN Shuck, MD;  Location: [facility] CATH LAB;  Service: Cardiovascular;  Laterality: N/A;   RIGHT/LEFT HEART CATH AND CORONARY/GRAFT ANGIOGRAPHY N/A 10/15/2022   Procedure: RIGHT/LEFT HEART CATH AND CORONARY/GRAFT ANGIOGRAPHY;  Surgeon: Verlin Lonni BIRCH, MD;  [facility] INVAS[facility] CV LAB;  Service: Cardiovascular;  Laterality: N/A;   SHOULDER OPEN ROTATOR CUFF REPAIR Right 2003   TALC  PLEURODESIS Left 04/12/2019   Procedure: TALC  PLEURADESIS;  Surgeon: Army Dallas NOVAK, MD;  Location: [facility] OR;  Service: Thoracic;  Laterality: Left;   TEE WITHOUT CARDIOVERSION N/A 03/24/2019   Procedure: TRANSESOPHAGEAL ECHOCARDIOGRAM (TEE);  Surgeon: Shuck Ezra GORMAN, MD;  Location: [facility] ENDOSCOPY;  Service: Cardiovascular;  Laterality: N/A;   TEE WITHOUT CARDIOVERSION  04/12/2019   Procedure: Transesophageal Echocardiogram (Tee);  Surgeon: Army Dallas NOVAK, MD;  Location: [facility] OR;  Service: Thoracic;;   THORACOSCOPY Left 04/12/2019   VIDEO BRONCHOSCOPY (N/A ) VIDEO ASSISTED THORACOSCOPY (Left Chest) TALC  PLEURADESIS (Left    TRANSCAROTID ARTERY REVASCULARIZATION  Right 09/13/2020   Procedure: RIGHT TRANSCAROTID ARTERY REVASCULARIZATION USING 9mm X 40mm ENROUTE TRANSCAROTID STEND SYSTEM;  Surgeon: Serene Gaile ORN, MD;  [facility];  Service: Vascular;  Laterality: Right;   TRANSCAROTID ARTERY REVASCULARIZATION  Left 02/08/2021   Procedure: LEFT TRANSCAROTID ARTERY REVASCULARIZATION;  Surgeon: Serene Gaile ORN, MD;  [facility] OR;  Service: Vascular;  Laterality: Left;   ULTRASOUND GUIDANCE FOR VASCULAR ACCESS  09/13/2020   Procedure: ULTRASOUND GUIDANCE FOR VASCULAR ACCESS;   Surgeon: Serene Gaile ORN, MD;  [facility];  Service: Vascular;;   ULTRASOUND GUIDANCE FOR VASCULAR ACCESS Bilateral 08/06/2022   Procedure: ULTRASOUND GUIDANCE FOR VASCULAR ACCESS;  Surgeon: Serene Gaile ORN, MD;  [facility];  Service: Vascular;  Laterality: Bilateral;   VIDEO ASSISTED THORACOSCOPY Left 04/12/2019   Procedure: VIDEO ASSISTED THORACOSCOPY;  Surgeon: Army Dallas NOVAK, MD;  [facility];  Service: Thoracic;  Laterality: Left;   VIDEO BRONCHOSCOPY N/A 04/12/2019   Procedure: VIDEO BRONCHOSCOPY;  Surgeon: Army Dallas NOVAK, MD;  [facility] OR;  Service: Thoracic;  Laterality: N/A;    Review of Systems Pertinent positives and negative findings are listed as part of the History of Present Illness and MDM  Physical Exam Vitals:   06/06/24 1120 06/06/24 1243 06/06/24 1245 06/06/24 1652  BP: (!) 127/58   (!) 126/54  Pulse: 94   79  Resp: 17   17  Temp: 98.8 F (37.1 C)   98.5 F (36.9 C)  SpO2: (!) 89%  92% 92%  Weight:  88.9 kg    Height:  6' 3 (1.905 m)       Constitutional Nursing notes  reviewed Vital signs reviewed  HEENT No obvious trauma Pupils round, equal, and reactive to light. Pupils cross midline Neck supple Uvula midline with no evidence of PTA, significant erythema, or tonsillar exudates  Respiratory Effort normal Breathing well on room air CTAB  CV Normal rate and rhythm *** ***No pitting edema  Abdomen Soft, non-tender, non-distended No peritonitis  MSK Atraumatic No obvious deformity ROM appropriate  Neuro Awake and alert Pupils cross midline Moving all extremities    MDM:  Initial Differential Diagnoses includes pharyngitis  I reviewed the patient's vitals, the nursing triage note and evaluated the patient at bedside.   I reviewed the patient's external records which show ***  I considered ACS and reviewed/interpreted the EKG. EKG shows normal sinus rhythm with no evidence of acute ischemic changes or high grade  conduction block. There is no STEMI or contiguous T wave inversions. No evidence of new-onset arrythmia. The PR, QRS, and QT intervals are normal.    The patient was risk stratified with a HEAR score of ***. Troponin  *** --> ***. This significantly lowers my suspicion for ACS.  Procedures: Procedures  Medications administered in the ED: Medications - No data to display   Impression: No diagnosis found.   Patient's presentation is most consistent with {EM COPA:27473}  Disposition: ED Disposition:  Data Unavailable   ***Discharge: Patient is felt to be medically appropriate for discharge at this time. Patient was instructed to follow up with their primary care doctor/specialists listed above for re-evaluation. Patient was given strict return precautions.  ED Discharge Orders     None

## 2024-06-06 NOTE — ED Triage Notes (Signed)
 Patient endorses one week of back pain, generalized weakness, congested cough, DOE, and chest pain. Wife reports no fever at home. Negative home covid test.

## 2024-06-06 NOTE — ED Provider Triage Note (Signed)
 Emergency Medicine Provider Triage Evaluation Note  Eric Gordon , a 89 y.o. male  was evaluated in triage.  Pt complains of weakness, cough.  Patient reports ongoing issues with dyspnea on exertion as well as a dry nonproductive cough that has progressively worsened over the last week.  He states that he does feel more out of breath when he tries to walk around at home.  Has slight pain in his chest when he feels more winded.  Reportedly negative home COVID test.  He does endorse a history of CHF.  Review of Systems  Positive: As above Negative: As above  Physical Exam  BP (!) 127/58   Pulse 94   Temp 98.8 F (37.1 C)   Resp 17   Ht 6' 3 (1.905 m)   Wt 88.9 kg   SpO2 92%   BMI 24.50 kg/m  Gen:   Awake, no distress   Resp:  Normal effort  MSK:   Moves extremities without difficulty  Other:    Medical Decision Making  Medically screening exam initiated at 1:09 PM.  Appropriate orders placed.  Eric Gordon was informed that the remainder of the evaluation will be completed by another provider, this initial triage assessment does not replace that evaluation, and the importance of remaining in the ED until their evaluation is complete.     Eric Gordon A, PA-C 06/06/24 1309

## 2024-06-07 ENCOUNTER — Encounter (HOSPITAL_COMMUNITY): Payer: Self-pay | Admitting: Internal Medicine

## 2024-06-07 DIAGNOSIS — Z87891 Personal history of nicotine dependence: Secondary | ICD-10-CM | POA: Diagnosis not present

## 2024-06-07 DIAGNOSIS — N183 Chronic kidney disease, stage 3 unspecified: Secondary | ICD-10-CM | POA: Diagnosis present

## 2024-06-07 DIAGNOSIS — Z79890 Hormone replacement therapy (postmenopausal): Secondary | ICD-10-CM | POA: Diagnosis not present

## 2024-06-07 DIAGNOSIS — J9601 Acute respiratory failure with hypoxia: Secondary | ICD-10-CM | POA: Insufficient documentation

## 2024-06-07 DIAGNOSIS — G250 Essential tremor: Secondary | ICD-10-CM | POA: Diagnosis present

## 2024-06-07 DIAGNOSIS — I5032 Chronic diastolic (congestive) heart failure: Secondary | ICD-10-CM | POA: Diagnosis present

## 2024-06-07 DIAGNOSIS — G4733 Obstructive sleep apnea (adult) (pediatric): Secondary | ICD-10-CM | POA: Diagnosis present

## 2024-06-07 DIAGNOSIS — B9789 Other viral agents as the cause of diseases classified elsewhere: Secondary | ICD-10-CM | POA: Diagnosis present

## 2024-06-07 DIAGNOSIS — N400 Enlarged prostate without lower urinary tract symptoms: Secondary | ICD-10-CM | POA: Diagnosis present

## 2024-06-07 DIAGNOSIS — I2510 Atherosclerotic heart disease of native coronary artery without angina pectoris: Secondary | ICD-10-CM | POA: Diagnosis present

## 2024-06-07 DIAGNOSIS — I130 Hypertensive heart and chronic kidney disease with heart failure and stage 1 through stage 4 chronic kidney disease, or unspecified chronic kidney disease: Secondary | ICD-10-CM | POA: Diagnosis present

## 2024-06-07 DIAGNOSIS — B348 Other viral infections of unspecified site: Secondary | ICD-10-CM | POA: Diagnosis not present

## 2024-06-07 DIAGNOSIS — J45909 Unspecified asthma, uncomplicated: Secondary | ICD-10-CM | POA: Diagnosis present

## 2024-06-07 DIAGNOSIS — Z8249 Family history of ischemic heart disease and other diseases of the circulatory system: Secondary | ICD-10-CM | POA: Diagnosis not present

## 2024-06-07 DIAGNOSIS — J9811 Atelectasis: Secondary | ICD-10-CM | POA: Diagnosis present

## 2024-06-07 DIAGNOSIS — J189 Pneumonia, unspecified organism: Secondary | ICD-10-CM | POA: Diagnosis not present

## 2024-06-07 DIAGNOSIS — E785 Hyperlipidemia, unspecified: Secondary | ICD-10-CM | POA: Diagnosis present

## 2024-06-07 DIAGNOSIS — R0902 Hypoxemia: Secondary | ICD-10-CM | POA: Diagnosis present

## 2024-06-07 DIAGNOSIS — E039 Hypothyroidism, unspecified: Secondary | ICD-10-CM | POA: Diagnosis present

## 2024-06-07 DIAGNOSIS — I480 Paroxysmal atrial fibrillation: Secondary | ICD-10-CM | POA: Diagnosis present

## 2024-06-07 DIAGNOSIS — K219 Gastro-esophageal reflux disease without esophagitis: Secondary | ICD-10-CM | POA: Diagnosis present

## 2024-06-07 DIAGNOSIS — I253 Aneurysm of heart: Secondary | ICD-10-CM | POA: Diagnosis present

## 2024-06-07 DIAGNOSIS — I25810 Atherosclerosis of coronary artery bypass graft(s) without angina pectoris: Secondary | ICD-10-CM | POA: Diagnosis present

## 2024-06-07 DIAGNOSIS — J1289 Other viral pneumonia: Secondary | ICD-10-CM | POA: Diagnosis present

## 2024-06-07 DIAGNOSIS — R1312 Dysphagia, oropharyngeal phase: Secondary | ICD-10-CM | POA: Diagnosis present

## 2024-06-07 DIAGNOSIS — Z79899 Other long term (current) drug therapy: Secondary | ICD-10-CM | POA: Diagnosis not present

## 2024-06-07 DIAGNOSIS — Z7901 Long term (current) use of anticoagulants: Secondary | ICD-10-CM | POA: Diagnosis not present

## 2024-06-07 LAB — CBC
HCT: 37.8 % — ABNORMAL LOW (ref 39.0–52.0)
Hemoglobin: 12.1 g/dL — ABNORMAL LOW (ref 13.0–17.0)
MCH: 30.6 pg (ref 26.0–34.0)
MCHC: 32 g/dL (ref 30.0–36.0)
MCV: 95.5 fL (ref 80.0–100.0)
Platelets: 142 K/uL — ABNORMAL LOW (ref 150–400)
RBC: 3.96 MIL/uL — ABNORMAL LOW (ref 4.22–5.81)
RDW: 14.8 % (ref 11.5–15.5)
WBC: 6.5 K/uL (ref 4.0–10.5)
nRBC: 0 % (ref 0.0–0.2)

## 2024-06-07 LAB — RESPIRATORY PANEL BY PCR

## 2024-06-07 LAB — MAGNESIUM: Magnesium: 1.8 mg/dL (ref 1.7–2.4)

## 2024-06-07 LAB — BASIC METABOLIC PANEL WITH GFR
Anion gap: 12 (ref 5–15)
BUN: 34 mg/dL — ABNORMAL HIGH (ref 8–23)
CO2: 25 mmol/L (ref 22–32)
Calcium: 9.1 mg/dL (ref 8.9–10.3)
Chloride: 100 mmol/L (ref 98–111)
Creatinine, Ser: 1.75 mg/dL — ABNORMAL HIGH (ref 0.61–1.24)
GFR, Estimated: 37 mL/min — ABNORMAL LOW (ref >60)
Glucose, Bld: 87 mg/dL (ref 70–99)
Potassium: 3.9 mmol/L (ref 3.5–5.1)
Sodium: 137 mmol/L (ref 135–145)

## 2024-06-07 LAB — PHOSPHORUS: Phosphorus: 4.1 mg/dL (ref 2.5–4.6)

## 2024-06-07 MED ORDER — ISOSORBIDE MONONITRATE ER 30 MG PO TB24
30.0000 mg | ORAL_TABLET | Freq: Every day | ORAL | Status: DC
  Administered 2024-06-07: 30 mg via ORAL
  Filled 2024-06-07: qty 1

## 2024-06-07 MED ORDER — ALBUTEROL SULFATE (2.5 MG/3ML) 0.083% IN NEBU: 2.5000 mg | INHALATION_SOLUTION | RESPIRATORY_TRACT | Status: DC | PRN

## 2024-06-07 MED ORDER — LEVOTHYROXINE SODIUM 50 MCG PO TABS
50.0000 ug | ORAL_TABLET | Freq: Every day | ORAL | Status: DC
  Administered 2024-06-07: 50 ug via ORAL
  Filled 2024-06-07 (×2): qty 1

## 2024-06-07 MED ORDER — AZITHROMYCIN 500 MG IV SOLR
500.0000 mg | Freq: Once | INTRAVENOUS | Status: AC
  Administered 2024-06-07: 500 mg via INTRAVENOUS
  Filled 2024-06-07: qty 5

## 2024-06-07 MED ORDER — SODIUM CHLORIDE 0.9 % IV SOLN
2.0000 g | Freq: Once | INTRAVENOUS | Status: AC
  Administered 2024-06-07: 2 g via INTRAVENOUS
  Filled 2024-06-07: qty 10

## 2024-06-07 MED ORDER — PRIMIDONE 50 MG PO TABS
50.0000 mg | ORAL_TABLET | Freq: Every day | ORAL | Status: DC
  Administered 2024-06-07: 50 mg via ORAL
  Filled 2024-06-07 (×2): qty 1

## 2024-06-07 MED ORDER — SODIUM CHLORIDE 0.9 % IV SOLN
3.0000 g | Freq: Four times a day (QID) | INTRAVENOUS | Status: DC
  Administered 2024-06-07 – 2024-06-08 (×5): 3 g via INTRAVENOUS
  Filled 2024-06-07 (×5): qty 8

## 2024-06-07 MED ORDER — FINASTERIDE 5 MG PO TABS
5.0000 mg | ORAL_TABLET | Freq: Every day | ORAL | Status: DC
  Administered 2024-06-07 – 2024-06-08 (×2): 5 mg via ORAL
  Filled 2024-06-07 (×2): qty 1

## 2024-06-07 MED ORDER — AZITHROMYCIN 250 MG PO TABS
500.0000 mg | ORAL_TABLET | Freq: Every day | ORAL | Status: DC
  Administered 2024-06-07: 500 mg via ORAL
  Filled 2024-06-07: qty 2

## 2024-06-07 MED ORDER — FUROSEMIDE 40 MG PO TABS
40.0000 mg | ORAL_TABLET | Freq: Every day | ORAL | Status: DC
  Administered 2024-06-07 – 2024-06-08 (×2): 40 mg via ORAL
  Filled 2024-06-07 (×2): qty 1

## 2024-06-07 MED ORDER — SODIUM CHLORIDE 0.9% FLUSH
3.0000 mL | Freq: Two times a day (BID) | INTRAVENOUS | Status: DC
  Administered 2024-06-07 – 2024-06-08 (×2): 3 mL via INTRAVENOUS

## 2024-06-07 MED ORDER — PANTOPRAZOLE SODIUM 40 MG PO TBEC
40.0000 mg | DELAYED_RELEASE_TABLET | Freq: Every day | ORAL | Status: DC
  Administered 2024-06-07 – 2024-06-08 (×2): 40 mg via ORAL
  Filled 2024-06-07 (×2): qty 1

## 2024-06-07 MED ORDER — ATORVASTATIN CALCIUM 80 MG PO TABS
80.0000 mg | ORAL_TABLET | Freq: Every day | ORAL | Status: DC
  Administered 2024-06-07 – 2024-06-08 (×2): 80 mg via ORAL
  Filled 2024-06-07 (×2): qty 1

## 2024-06-07 MED ORDER — EDOXABAN TOSYLATE 30 MG PO TABS
30.0000 mg | ORAL_TABLET | ORAL | Status: DC
  Administered 2024-06-07 – 2024-06-08 (×2): 30 mg via ORAL
  Filled 2024-06-07 (×2): qty 1

## 2024-06-07 MED ORDER — TRAMADOL HCL 50 MG PO TABS: 50.0000 mg | ORAL_TABLET | Freq: Four times a day (QID) | ORAL | Status: DC | PRN

## 2024-06-07 MED ORDER — METOPROLOL SUCCINATE ER 25 MG PO TB24
25.0000 mg | ORAL_TABLET | Freq: Every day | ORAL | Status: DC
  Administered 2024-06-07 – 2024-06-08 (×2): 25 mg via ORAL
  Filled 2024-06-07 (×2): qty 1

## 2024-06-07 MED ORDER — MEXILETINE HCL 150 MG PO CAPS
150.0000 mg | ORAL_CAPSULE | Freq: Two times a day (BID) | ORAL | Status: DC
  Administered 2024-06-07 – 2024-06-08 (×3): 150 mg via ORAL
  Filled 2024-06-07 (×4): qty 1

## 2024-06-07 MED ORDER — GUAIFENESIN ER 600 MG PO TB12
600.0000 mg | ORAL_TABLET | Freq: Two times a day (BID) | ORAL | Status: DC
  Administered 2024-06-07 – 2024-06-08 (×3): 600 mg via ORAL
  Filled 2024-06-07 (×4): qty 1

## 2024-06-07 MED ORDER — LOSARTAN POTASSIUM 50 MG PO TABS
50.0000 mg | ORAL_TABLET | Freq: Every day | ORAL | Status: DC
Start: 2024-06-07 — End: 2024-06-08
  Administered 2024-06-07: 50 mg via ORAL
  Filled 2024-06-07 (×2): qty 1

## 2024-06-07 NOTE — Evaluation (Signed)
 Clinical/Bedside Swallow Evaluation Patient Details  Name: Eric Gordon MRN: [id number] Date of Birth: [birth year]  Today's Date: 06/07/2024 Time: SLP Start Time (ACUTE ONLY): 1006 SLP Stop Time (ACUTE ONLY): 1027 SLP Time Calculation (min) (ACUTE ONLY): 21 min  Past Medical History:  Past Medical History:  Diagnosis Date   Abdominal aortic aneurysm (AAA) (HCC)    Achilles tendinitis of right lower extremity    AICD (automatic cardioverter/defibrillator) present    Arthritis    my whole body (03/19/2018)   Atherosclerosis of coronary artery bypass graft w/o angina pectoris    Atrial fibrillation (HCC)    Barrett's esophagus    CAD (coronary artery disease)    Carotid artery disease (HCC)    CHF (congestive heart failure) (HCC)    Chronic anticoagulation    PE   Chronic lower back pain    Dyspnea    Dysrhythmia    Essential tremor    GERD (gastroesophageal reflux disease)    High cholesterol    Hyperlipidemia    Hyperplasia, prostate    Hypertension    Hypothyroidism    Myocardial infarction (HCC)    2 in Jan. 2019   Nail dystrophy    OSA on CPAP    uses CPAP   Osteoarthrosis    Pneumonia    now and once before (03/19/2018)   Pulmonary embolism [facility])    April 2012 after CABG   S/P CABG (coronary artery bypass graft)    Sleep apnea    Spontaneous pneumothorax    left spontaneous pneumothorax/left pleural effusion, s/p chest tube 01/09/19; thoracentesis x2, s/p left VATS/tacl pleurodesis 04/12/19   Past Surgical History:  Past Surgical History:  Procedure Laterality Date   ABDOMINAL AORTIC ENDOVASCULAR STENT GRAFT N/A 08/06/2022   Procedure: ABDOMINAL AORTIC ENDOVASCULAR STENT GRAFT;  Surgeon: Serene Gaile ORN, MD;  [facility] OR;  Service: Vascular;  Laterality: N/A;   ACHILLES TENDON REPAIR Bilateral    2006 & 2004   CARDIAC CATHETERIZATION  11/2010   CARDIOVERSION N/A 08/03/2023   Procedure: CARDIOVERSION (CATH LAB);  Surgeon: Rolan Ezra RAMAN, MD;   Location: [facility] INVASIVE CV LAB;  Service: Cardiovascular;  Laterality: N/A;   CORONARY ANGIOGRAPHY N/A 10/22/2018   Procedure: CORONARY ANGIOGRAPHY;  Surgeon: Burnard Debby LABOR, MD;  [facility] INVASIVE CV LAB;  Service: Cardiovascular;  Laterality: N/A;   CORONARY ARTERY BYPASS GRAFT  March 2012   CABG X 5   CORONARY PRESSURE/FFR STUDY N/A 10/22/2018   Procedure: INTRAVASCULAR PRESSURE WIRE/FFR STUDY;  Surgeon: Burnard Debby LABOR, MD;  [facility] INVASIVE CV LAB;  Service: Cardiovascular;  Laterality: N/A;   ESOPHAGOGASTRODUODENOSCOPY (EGD) WITH PROPOFOL  N/A 12/10/2020   Procedure: ESOPHAGOGASTRODUODENOSCOPY (EGD) WITH PROPOFOL ;  Surgeon: Legrand Victory LITTIE DOUGLAS, MD;  Location: [facility] ENDOSCOPY;  Service: Gastroenterology;  Laterality: N/A;   ICD IMPLANT N/A 02/25/2022   Procedure: ICD IMPLANT;  Surgeon: Cindie Ole DASEN, MD;  Location: [facility] INVASIVE CV LAB;  Service: Cardiovascular;  Laterality: N/A;   INGUINAL HERNIA REPAIR Left 1980   IR THORACENTESIS ASP PLEURAL SPACE W/IMG GUIDE  02/04/2019   IR THORACENTESIS ASP PLEURAL SPACE W/IMG GUIDE  02/24/2019   KNEE ARTHROSCOPY Left 2006   LEFT HEART CATH AND CORS/GRAFTS ANGIOGRAPHY N/A 10/18/2018   Procedure: LEFT HEART CATH AND CORS/GRAFTS ANGIOGRAPHY;  Surgeon: Court Dorn PARAS, MD;  [facility] INVASIVE CV LAB;  Service: Cardiovascular;  Laterality: N/A;   LEFT HEART CATH AND CORS/GRAFTS ANGIOGRAPHY N/A 10/21/2018   Procedure: LEFT HEART CATH AND CORS/GRAFTS ANGIOGRAPHY;  Surgeon: Burnard Debby LABOR, MD;  Location: [facility] INVASIVE CV LAB;  Service: Cardiovascular;  Laterality: N/A;   PACEMAKER IMPLANT N/A 02/24/2022   Procedure: PACEMAKER IMPLANT;  Surgeon: Fernande Elspeth BROCKS, MD;  Location: [facility] INVASIVE CV LAB;  Service: Cardiovascular;  Laterality: N/A;   PENILE PROSTHESIS IMPLANT     PLEURAL BIOPSY Left 04/12/2019   Procedure: Pleural Biopsy;  Surgeon: Army Dallas NOVAK, MD;  Location: [facility] OR;  Service: Thoracic;  Laterality: Left;   PLEURAL EFFUSION DRAINAGE Left  04/12/2019   Procedure: Drainage Of Pleural Effusion;  Surgeon: Army Dallas NOVAK, MD;  Location: [facility] OR;  Service: Thoracic;  Laterality: Left;   RIGHT HEART CATHETERIZATION N/A 11/14/2013   Procedure: RIGHT HEART CATH;  Surgeon: Ezra GORMAN Shuck, MD;  Location: [facility] CATH LAB;  Service: Cardiovascular;  Laterality: N/A;   RIGHT/LEFT HEART CATH AND CORONARY/GRAFT ANGIOGRAPHY N/A 10/15/2022   Procedure: RIGHT/LEFT HEART CATH AND CORONARY/GRAFT ANGIOGRAPHY;  Surgeon: Verlin Lonni BIRCH, MD;  [facility] INVASIVE CV LAB;  Service: Cardiovascular;  Laterality: N/A;   SHOULDER OPEN ROTATOR CUFF REPAIR Right 2003   TALC  PLEURODESIS Left 04/12/2019   Procedure: TALC  PLEURADESIS;  Surgeon: Army Dallas NOVAK, MD;  Location: [facility] OR;  Service: Thoracic;  Laterality: Left;   TEE WITHOUT CARDIOVERSION N/A 03/24/2019   Procedure: TRANSESOPHAGEAL ECHOCARDIOGRAM (TEE);  Surgeon: Shuck Ezra GORMAN, MD;  Location: [facility] ENDOSCOPY;  Service: Cardiovascular;  Laterality: N/A;   TEE WITHOUT CARDIOVERSION  04/12/2019   Procedure: Transesophageal Echocardiogram (Tee);  Surgeon: Army Dallas NOVAK, MD;  Location: [facility] OR;  Service: Thoracic;;   THORACOSCOPY Left 04/12/2019   VIDEO BRONCHOSCOPY (N/A ) VIDEO ASSISTED THORACOSCOPY (Left Chest) TALC  PLEURADESIS (Left    TRANSCAROTID ARTERY REVASCULARIZATION  Right 09/13/2020   Procedure: RIGHT TRANSCAROTID ARTERY REVASCULARIZATION USING 9mm X 40mm ENROUTE TRANSCAROTID STEND SYSTEM;  Surgeon: Serene Gaile ORN, MD;  [facility] OR;  Service: Vascular;  Laterality: Right;   TRANSCAROTID ARTERY REVASCULARIZATION  Left 02/08/2021   Procedure: LEFT TRANSCAROTID ARTERY REVASCULARIZATION;  Surgeon: Serene Gaile ORN, MD;  [facility] OR;  Service: Vascular;  Laterality: Left;   ULTRASOUND GUIDANCE FOR VASCULAR ACCESS  09/13/2020   Procedure: ULTRASOUND GUIDANCE FOR VASCULAR ACCESS;  Surgeon: Serene Gaile ORN, MD;  [facility] OR;  Service: Vascular;;   ULTRASOUND GUIDANCE FOR VASCULAR  ACCESS Bilateral 08/06/2022   Procedure: ULTRASOUND GUIDANCE FOR VASCULAR ACCESS;  Surgeon: Serene Gaile ORN, MD;  [facility] OR;  Service: Vascular;  Laterality: Bilateral;   VIDEO ASSISTED THORACOSCOPY Left 04/12/2019   Procedure: VIDEO ASSISTED THORACOSCOPY;  Surgeon: Army Dallas NOVAK, MD;  Location: [facility] OR;  Service: Thoracic;  Laterality: Left;   VIDEO BRONCHOSCOPY N/A 04/12/2019   Procedure: VIDEO BRONCHOSCOPY;  Surgeon: Army Dallas NOVAK, MD;  Location: [facility] OR;  Service: Thoracic;  Laterality: N/A;   HPI:  89 yo male presenting 9/15 with SOB/URI symptoms for one week PTA. Found to have PNA. PMH includes: Barrett's esophagus, GERD, PNA, CAD s/p CABG '12, HFpEF, Moderate AS, LV pseudoaneurysm, VT s/p ICD on mexilitine, paroxysmal A-fib on Eliquis , AAA s/p EVAR '23, carotid stenosis s/p R TCAR '22, CKD3, HLD, HTN, hypothyroidism, asthma, OSA on CPAP, hx PE post OP CABG    Assessment / Plan / Recommendation  Clinical Impression  Pt has a baseline cough that persists throughout PO trials but does not appear to change and does not immediately follow any specific POs. His oral motor exam is WNL, and he has no overt signs of dysphagia. Pt says that over  the last week, as he has had a sore throat, he has been experiencing odynophagia and solid shave felt hung in his throat. He reports that his sore throat today is 90% improved, and with this improvement, his self-reported symptoms of dysphagia are resolved. Recommend continuing with regular textures and thin liquids, with education offered about esophageal and aspiration precautions, as well as importance of rest breaks PRN for respiratory status. SLP will f/u briefly.    SLP Visit Diagnosis: Dysphagia, unspecified (R13.10)    Aspiration Risk       Diet Recommendation Regular;Thin liquid    Liquid Administration via: Cup;Straw Medication Administration: Whole meds with liquid Supervision: Patient able to self feed;Intermittent supervision to  cue for compensatory strategies Compensations: Slow rate;Small sips/bites;Follow solids with liquid Postural Changes: Seated upright at 90 degrees;Remain upright for at least 30 minutes after po intake    Other  Recommendations Oral Care Recommendations: Oral care BID     Assistance Recommended at Discharge    Functional Status Assessment Patient has had a recent decline in their functional status and demonstrates the ability to make significant improvements in function in a reasonable and predictable amount of time.  Frequency and Duration min 2x/week  2 weeks       Prognosis Prognosis for improved oropharyngeal function: Good      Swallow Study   General HPI: 89 yo male presenting 9/15 with SOB/URI symptoms for one week PTA. Found to have PNA. PMH includes: Barrett's esophagus, GERD, PNA, CAD s/p CABG '12, HFpEF, Moderate AS, LV pseudoaneurysm, VT s/p ICD on mexilitine, paroxysmal A-fib on Eliquis , AAA s/p EVAR '23, carotid stenosis s/p R TCAR '22, CKD3, HLD, HTN, hypothyroidism, asthma, OSA on CPAP, hx PE post OP CABG Type of Study: Bedside Swallow Evaluation Previous Swallow Assessment: none in chart Diet Prior to this Study: Regular;Thin liquids (Level 0) Temperature Spikes Noted: No Respiratory Status: Nasal cannula History of Recent Intubation: No Behavior/Cognition: Alert;Cooperative;Pleasant mood Oral Cavity Assessment: Within Functional Limits Oral Care Completed by SLP: No Oral Cavity - Dentition: Adequate natural dentition Vision: Functional for self-feeding Self-Feeding Abilities: Able to feed self Patient Positioning: Upright in chair Baseline Vocal Quality: Normal Volitional Cough: Strong;Congested Volitional Swallow: Able to elicit    Oral/Motor/Sensory Function Overall Oral Motor/Sensory Function: Within functional limits   Ice Chips Ice chips: Not tested   Thin Liquid Thin Liquid: Impaired Presentation: Self Fed;Straw Pharyngeal  Phase Impairments: Cough -  Delayed    Nectar Thick Nectar Thick Liquid: Not tested   Honey Thick Honey Thick Liquid: Not tested   Puree Puree: Impaired Presentation: Self Fed;Spoon Pharyngeal Phase Impairments: Cough - Delayed   Solid     Solid: Impaired Presentation: Self Fed Pharyngeal Phase Impairments: Cough - Delayed      Leita SAILOR., M.A. CCC-SLP Acute Rehabilitation Services Office: [phone]  Secure chat preferred  06/07/2024,10:30 AM

## 2024-06-07 NOTE — Progress Notes (Signed)
 SATURATION QUALIFICATIONS: (This note is used to comply with regulatory documentation for home oxygen )  Patient Saturations on Room Air at Rest = 87%  Patient Saturations on Room Air while Ambulating = NT as pt desat on Ra at rest  Patient Saturations on 3 Liters of oxygen  while Ambulating = 88%  Please briefly explain why patient needs home oxygen :Pt requiring 2LO2 at rest and 3LO2 with activity to maintain sats > 88%. Jumar Greenstreet M,PT Acute Rehab Services [phone]

## 2024-06-07 NOTE — Progress Notes (Signed)
 PROGRESS NOTE  Eric Gordon FMW:[id number] DOB: 12/29/1930   PCP: [name], MD  Patient is from: Home.    DOA: 06/06/2024 LOS: 0  Chief complaints No chief complaint on file.    Brief Narrative / Interim history: 89 y.o. male with hx of  CAD s/p CABG in 2012, HFpEF, Moderate AS, LV pseudoaneurysm, VT s/p ICD on mexilitine, paroxysmal A-fib on Eliquis , AAA s/p EVAR '23, carotid stenosis s/p R TCAR '22, CKD3, HLD, HTN, Asthma, OSA on CPAP, hx PE after CABG, who presents with worsening shortness of breath, cough, sore throat, generalized weakness for about a week, and admitted with LLL pneumonia with concern for possible aspiration pneumonia.  No dysphagia but odynophagia likely from sore throat that has resolved.  In ED, brief desaturation to 89% but no oxygen  requirement.  WBC 10.8. Cr 1.74.  BUN 28.  BNP 330.  Troponin 18 x 2.  COVID-19, influenza and RSV PCR nonreactive.  CXR without acute finding but chronic left pleural effusion and scarring.  CT angio chest negative for PE but small left pleural effusion and bibasilar atelectasis with airway inflammation and impaction with LLL and stable AAA, descending thoracic aorta and severe sigmoid diverticulosis without evidence of diverticulitis.  Sputum culture and a 20 pathogen RVP ordered.  Started on Unasyn  and Zithromax  and admitted.  Subjective: Seen and examined earlier this morning.  No major events overnight or this morning.  Sleeping on bedside chair.  Feels better from breathing standpoint.  Still with dry cough.  Denies chest pain.  Denies nausea, vomiting or abdominal pain.  He says he had a sore throat about 2 days ago that has resolved.  Denies dysphagia.  Objective: Vitals:   06/07/24 0110 06/07/24 0151 06/07/24 0816 06/07/24 1543  BP: (!) 120/52 (!) 152/69 (!) 115/56 (!) 113/50  Pulse: 79 90 79 75  Resp: 20 (!) 24 16 16   Temp:   98 F (36.7 C) (!) 97.5 F (36.4 C)  TempSrc:  Oral    SpO2: 96% 96% 94% 97%  Weight:   89.5 kg    Height:  6' 3 (1.905 m)      Examination:  GENERAL: No apparent distress.  Nontoxic. HEENT: MMM.  Vision and hearing grossly intact.  NECK: Supple.  No apparent JVD.  RESP:  No IWOB.  Rhonchi over RLL.  Diminished aeration in LLL. CVS:  RRR. Heart sounds normal.  ABD/GI/GU: BS+. Abd soft, NTND.  MSK/EXT:  Moves extremities. No apparent deformity. No edema.  SKIN: no apparent skin lesion or wound NEURO: Sleeping but wakes to voice.  Oriented x 4.  No apparent focal neuro deficit. PSYCH: Calm. Normal affect.   Consultants:  None  Procedures: None  Microbiology summarized: COVID-19, influenza and RSV PCR nonreactive A-20 pathogen RVP pending  Assessment and plan: Community-acquired pneumonia: Some concern about aspiration due to the report of odynophagia when he had a sore throat.  Patient denies dysphagia.  Odynophagia has resolved.  CT raises concern for LLL infiltrate or atelectasis and small left pleural effusion.  Pleural effusion seems to be chronic.  He had mild leukocytosis that has resolved now.  Reports improvement in his symptoms. -Continue Unasyn  and Zithromax  -Encourage incentive spirometry -Follow a 20 pathogen RVP. -Continue as needed bronchodilators, mucolytic's and antitussive  -OOB, PT/OT    CAD s/p CABG in 2012: Stable.  No chest pain.  On edoxaban , atorvastatin  Chronic HFpEF:   BNP similar to prior.  Continue home Lasix  40 mg daily Moderate AS: Noted,  outpatient surveillance LV pseudoaneurysm: Chronic also seen on CT VT s/p ICD: Biotronik ICD, continue home mexiletine Paroxysmal A-fib: Continue home edoxaban , metoprolol  Aortic aneurysm s/p EVAR ' 23: Stable on CT.  Outpatient surveillance. Carotid stenosis s/p R TCAR '22: See CAD CKD-3B: Baseline creatinine ~ 1.7  Hyperlipidemia: See CAD Hypertension: Continue home losartan , ISMN, metoprolol  per above Hypothyroidism: Continue home levothyroxine  Asthma: Nebs prn OSA: CPAP nightly History  PE postop CABG: On AC for A-fib BPH: Continue home finasteride  GERD: Continue pantoprazole  Essential tremor: Continue primidone   Body mass index is 24.66 kg/m.           DVT prophylaxis:   edoxaban  (SAVAYSA ) tablet 30 mg  Code Status: Full code Family Communication: None at bedside Level of care: Telemetry Medical Status is: Inpatient Remains inpatient appropriate because: Community-acquired pneumonia   Final disposition: Home   55 minutes with more than 50% spent in reviewing records, counseling patient/family and coordinating care.   Sch Meds:  Scheduled Meds:  atorvastatin   80 mg Oral Daily   azithromycin   500 mg Oral QHS   edoxaban   30 mg Oral Q24H   finasteride   5 mg Oral Daily   furosemide   40 mg Oral Daily   guaiFENesin   600 mg Oral BID   isosorbide  mononitrate  30 mg Oral QHS   levothyroxine   50 mcg Oral QHS   losartan   50 mg Oral Daily   metoprolol  succinate  25 mg Oral Daily   mexiletine  150 mg Oral BID   pantoprazole   40 mg Oral QAC breakfast   primidone   50 mg Oral QHS   sodium chloride  flush  3 mL Intravenous Q12H   Continuous Infusions:  ampicillin -sulbactam (UNASYN ) IV 3 g (06/07/24 1733)   PRN Meds:.albuterol , traMADol   Antimicrobials: Anti-infectives (From admission, onward)    Start     Dose/Rate Route Frequency Ordered Stop   06/07/24 0600  Ampicillin -Sulbactam (UNASYN ) 3 g in sodium chloride  0.9 % 100 mL IVPB        3 g 200 mL/hr over 30 Minutes Intravenous Every 6 hours 06/07/24 0456 06/13/24 2359   06/07/24 0548  azithromycin  (ZITHROMAX ) tablet 500 mg        500 mg Oral Daily at bedtime 06/07/24 0548 06/09/24 2159   06/07/24 0015  aztreonam  (AZACTAM ) 2 g in sodium chloride  0.9 % 100 mL IVPB        2 g 200 mL/hr over 30 Minutes Intravenous  Once 06/07/24 0009 06/07/24 0138   06/07/24 0015  azithromycin  (ZITHROMAX ) 500 mg in sodium chloride  0.9 % 250 mL IVPB        500 mg 250 mL/hr over 60 Minutes Intravenous  Once 06/07/24 0009  06/07/24 0312   06/07/24 0000  levofloxacin  (LEVAQUIN ) IVPB 750 mg  Status:  Discontinued        750 mg 100 mL/hr over 90 Minutes Intravenous  Once 06/06/24 2355 06/07/24 0004        I have personally reviewed the following labs and images: CBC: Recent Labs  Lab 06/06/24 1300 06/07/24 0648  WBC 10.8* 6.5  HGB 13.2 12.1*  HCT 42.4 37.8*  MCV 97.5 95.5  PLT 166 142*   BMP &GFR Recent Labs  Lab 06/06/24 1300 06/07/24 0648  NA 135 137  K 4.3 3.9  CL 98 100  CO2 24 25  GLUCOSE 102* 87  BUN 28* 34*  CREATININE 1.74* 1.75*  CALCIUM  9.5 9.1  MG  --  1.8  PHOS  --  4.1  Estimated Creatinine Clearance: 33.5 mL/min (A) (by C-G formula based on SCr of 1.75 mg/dL (H)). Liver & Pancreas: No results for input(s): AST, ALT, ALKPHOS, BILITOT, PROT, ALBUMIN  in the last 168 hours. No results for input(s): LIPASE, AMYLASE in the last 168 hours. No results for input(s): AMMONIA in the last 168 hours. Diabetic: No results for input(s): HGBA1C in the last 72 hours. No results for input(s): GLUCAP in the last 168 hours. Cardiac Enzymes: No results for input(s): CKTOTAL, CKMB, CKMBINDEX, TROPONINI in the last 168 hours. No results for input(s): PROBNP in the last 8760 hours. Coagulation Profile: No results for input(s): INR, PROTIME in the last 168 hours. Thyroid  Function Tests: No results for input(s): TSH, T4TOTAL, FREET4, T3FREE, THYROIDAB in the last 72 hours. Lipid Profile: No results for input(s): CHOL, HDL, LDLCALC, TRIG, CHOLHDL, LDLDIRECT in the last 72 hours. Anemia Panel: No results for input(s): VITAMINB12, FOLATE, FERRITIN, TIBC, IRON, RETICCTPCT in the last 72 hours. Urine analysis:    Component Value Date/Time   COLORURINE YELLOW 08/04/2022 1349   APPEARANCEUR CLEAR 08/04/2022 1349   LABSPEC 1.010 08/04/2022 1349   PHURINE 5.0 08/04/2022 1349   GLUCOSEU NEGATIVE 08/04/2022 1349   HGBUR SMALL  (A) 08/04/2022 1349   BILIRUBINUR NEGATIVE 08/04/2022 1349   KETONESUR NEGATIVE 08/04/2022 1349   PROTEINUR NEGATIVE 08/04/2022 1349   UROBILINOGEN 0.2 12/23/2010 2026   NITRITE NEGATIVE 08/04/2022 1349   LEUKOCYTESUR TRACE (A) 08/04/2022 1349   Sepsis Labs: Invalid input(s): PROCALCITONIN, LACTICIDVEN  Microbiology: Recent Results (from the past 240 hours)  Resp panel by RT-PCR (RSV, Flu A&B, Covid) Anterior Nasal Swab     Status: None   Collection Time: 06/06/24 12:52 PM   Specimen: Anterior Nasal Swab  Result Value Ref Range Status   SARS Coronavirus 2 by RT PCR NEGATIVE NEGATIVE Final   Influenza A by PCR NEGATIVE NEGATIVE Final   Influenza B by PCR NEGATIVE NEGATIVE Final    Comment: (NOTE) The Xpert Xpress SARS-CoV-2/FLU/RSV plus assay is intended as an aid in the diagnosis of influenza from Nasopharyngeal swab specimens and should not be used as a sole basis for treatment. Nasal washings and aspirates are unacceptable for Xpert Xpress SARS-CoV-2/FLU/RSV testing.  Fact Sheet for Patients: BloggerCourse.com  Fact Sheet for Healthcare Providers: SeriousBroker.it  This test is not yet approved or cleared by the [place]  FDA and has been authorized for detection and/or diagnosis of SARS-CoV-2 by FDA under an Emergency Use Authorization (EUA). This EUA will remain in effect (meaning this test can be used) for the duration of the COVID-19 declaration under Section 564(b)(1) of the Act, 21 U.S.C. section 360bbb-3(b)(1), unless the authorization is terminated or revoked.     Resp Syncytial Virus by PCR NEGATIVE NEGATIVE Final    Comment: (NOTE) Fact Sheet for Patients: BloggerCourse.com  Fact Sheet for Healthcare Providers: SeriousBroker.it  This test is not yet approved or cleared by the [place]  FDA and has been authorized for detection and/or diagnosis of  SARS-CoV-2 by FDA under an Emergency Use Authorization (EUA). This EUA will remain in effect (meaning this test can be used) for the duration of the COVID-19 declaration under Section 564(b)(1) of the Act, 21 U.S.C. section 360bbb-3(b)(1), unless the authorization is terminated or revoked.  Performed at [facility], [address] [place], [place] [address]     Radiology Studies: CT Angio Chest/Abd/Pel for Dissection W and/or Wo Contrast Result Date: 06/06/2024 EXAM: CTA CHEST, ABDOMEN AND PELVIS WITH AND WITHOUT CONTRAST 06/06/2024  10:40:24 PM TECHNIQUE: CTA of the chest was performed with and without the administration of intravenous contrast. CTA of the abdomen and pelvis was performed with and without the administration of intravenous contrast. Multiplanar reformatted images are provided for review. MIP images are provided for review. Automated exposure control, iterative reconstruction, and/or weight based adjustment of the mA/kV was utilized to reduce the radiation dose to as low as reasonably achievable. COMPARISON: 12/01/2022 CLINICAL HISTORY: 89 y.o. with chest pain and SOB. prior CABG and AAA repair. ALso with generalized abdominal pain. Evaluate for PE and stability of AAA. FINDINGS: VASCULATURE: AORTA: Status post abdominal aortic aneurysm repair. Aneurysm sac measures 4.8 x 5.1 cm, stable since prior examination. Stable fusiform dilation of the descending thoracic aorta measuring 3.9 cm in maximal diameter proximally. Extensive atherosclerotic calcification within the descending thoracic aorta. No dissection. PULMONARY ARTERIES: The central pulmonary arteries are enlarged in keeping with changes of pulmonary arterial hypertension. No pulmonary embolism within the limits of this exam. GREAT VESSELS OF AORTIC ARCH: No acute finding. No dissection. No arterial occlusion or significant stenosis. CELIAC TRUNK: No acute finding. No occlusion or significant stenosis. SUPERIOR MESENTERIC  ARTERY: Demonstrates a less than 50% stenosis at its origin and is distally widely patent. INFERIOR MESENTERIC ARTERY: Occluded at its origin but is reconstituted by the arc of [place]. RENAL ARTERIES: Demonstrate normal vascular morphology and there is no aneurysm or dissection identified. ILIAC ARTERIES: Bifurcated stent graft repair of the infrarenal abdominal aorta has been performed. Lower extremity arterial inflow is widely patent. Moderate atherosclerotic calcification noted. Internal iliac arteries are patent bilaterally. CHEST: MEDIASTINUM: Mild cardiomegaly. There is both a broad-based aneurysm of the lateral wall of the left ventricle, as well as a narrow neck pseudoaneurysm of the inferior wall of the left ventricle seen at image 128/6 with the pseudoaneurysm sac measuring 1.9 x 3.2 x 3.1 cm. A left subclavian dual-lead pacemaker is identified with leads within the right atrium and right ventricle toward the apex. No pericardial effusion. LUNGS AND PLEURA: Small left pleural effusion. Bibasilar atelectasis. Superimposed bronchial wall thickening noted in keeping with airway inflammation with airway impaction within the left lower lobe. No central obstructing lesion. No pneumothorax. Mild emphysema. THORACIC BONES AND SOFT TISSUES: No acute bone or soft tissue abnormality. ABDOMEN AND PELVIS: LIVER: The liver is unremarkable. GALLBLADDER AND BILE DUCTS: Gallbladder is unremarkable. No biliary ductal dilatation. SPLEEN: The spleen is unremarkable. PANCREAS: The pancreas is unremarkable. ADRENAL GLANDS: Bilateral adrenal glands demonstrate no acute abnormality. KIDNEYS, URETERS AND BLADDER: Simple cysts within the kidneys bilaterally for which no follow up imaging is recommended. No stones in the kidneys or ureters. No hydronephrosis. No perinephric or periureteral stranding. Urinary bladder is unremarkable. GI AND BOWEL: Severe sigmoid diverticulosis. Scattered diverticula are seen throughout the remainder  of the colon. The stomach, small bowel, and large bowel are otherwise unremarkable. Appendix normal. Small hiatal hernia. REPRODUCTIVE: Mild prostatic hypertrophy. Penile prosthesis in place. PERITONEUM AND RETROPERITONEUM: No ascites or free air. LYMPH NODES: No lymphadenopathy. ABDOMINAL BONES AND SOFT TISSUES: Osseous structures are age appropriate. No acute bone abnormality. No lytic or blastic bone lesion. Small fat-containing bilateral inguinal hernias. IMPRESSION: 1. No pulmonary embolism within the limits of this exam. 2. Status post abdominal aortic aneurysm repair with stable aneurysm sac measuring 4.8 x 5.1 cm. 3. Stable fusiform dilation of the descending thoracic aorta measuring 3.9 cm in maximal diameter proximally. No dissection. Follow-up imaging will be predicated on the patient's clinical comorbidities. 4. Mild cardiomegaly with  broad-based aneurysm of the lateral wall of the left ventricle and narrow neck pseudoaneurysm of the inferior wall of the left ventricle. No pericardial effusion. 5. Small left pleural effusion and bibasilar atelectasis with airway inflammation and impaction within the left lower lobe. No pneumothorax. 6. Severe sigmoid diverticulosis without evidence of diverticulitis. Electronically signed by: Dorethia Molt MD 06/06/2024 11:04 PM EDT RP Workstation: [id number]      Wania Longstreth T. Annya Lizana [facility]  If 7PM-7AM, please contact night-coverage [email or web address] 06/07/2024, 5:37 PM

## 2024-06-07 NOTE — Evaluation (Signed)
 Physical Therapy Evaluation Patient Details Name: Eric Gordon MRN: [id number] DOB: [birth year] Today's Date: 06/07/2024  History of Present Illness  Eric Gordon is a 89 y.o. male presents with SOB / URI symptoms.  Reports that symptoms began approximately 1 week ago with cough, sore throat, generalized weakness.  Symptoms continued throughout the week but he had worsening weakness and shortness of breath so sought care.  Positive for PNA. PMH: CAD s/p CABG '12, HFpEF, Moderate AS, LV pseudoaneurysm, VT s/p ICD on mexilitine, paroxysmal A-fib on Eliquis , AAA s/p EVAR '23, carotid stenosis s/p R TCAR '22, CKD3, HLD, HTN, Asthma, OSA on CPAP, hx PE post OP CABG  Clinical Impression  Pt admitted with above diagnosis. Pt was able to ambulate with RW with overall good stability. Pt requiring 2LO2 at rest and 3LO2 with activity to keep sats > 88%.  Will follow acutely and progress pt as able.  Pt currently with functional limitations due to the deficits listed below (see PT Problem List). Pt will benefit from acute skilled PT to increase their independence and safety with mobility to allow discharge.           If plan is discharge home, recommend the following: Assistance with cooking/housework;Assist for transportation;Help with stairs or ramp for entrance   Can travel by private vehicle        Equipment Recommendations Other (comment) (home O2)  Recommendations for Other Services       Functional Status Assessment Patient has had a recent decline in their functional status and demonstrates the ability to make significant improvements in function in a reasonable and predictable amount of time.     Precautions / Restrictions Precautions Precautions: Fall Precaution/Restrictions Comments: DROPLET Restrictions Weight Bearing Restrictions Per Provider Order: No      Mobility  Bed Mobility Overal bed mobility: Independent             General bed mobility comments: inc  time to come to EOB but didnt need help    Transfers Overall transfer level: Needs assistance Equipment used: Rolling walker (2 wheels) Transfers: Sit to/from Stand Sit to Stand: Contact guard assist           General transfer comment: cues for hand placement only and didnt need assist    Ambulation/Gait Ambulation/Gait assistance: Contact guard assist Gait Distance (Feet): 280 Feet Assistive device: Rolling walker (2 wheels) Gait Pattern/deviations: Step-through pattern, Decreased stride length   Gait velocity interpretation: 1.31 - 2.62 ft/sec, indicative of limited community ambulator   General Gait Details: Pt able to ambulate with RW wtihout LOB with good stability. Requiring up to 3LO2 to keep sats > 88% with activity and 2LO2 at rest.  Stairs            Wheelchair Mobility     Tilt Bed    Modified Rankin (Stroke Patients Only)       Balance Overall balance assessment: Needs assistance Sitting-balance support: No upper extremity supported, Feet supported Sitting balance-Leahy Scale: Fair     Standing balance support: Bilateral upper extremity supported, During functional activity, Reliant on assistive device for balance Standing balance-Leahy Scale: Poor Standing balance comment: relies on RW                             Pertinent Vitals/Pain Pain Assessment Pain Assessment: Faces Faces Pain Scale: Hurts little more Pain Location: chest Pain Descriptors / Indicators: Aching, Discomfort, Grimacing, Guarding Pain Intervention(s): Limited  activity within patient's tolerance, Monitored during session, Repositioned    Home Living Family/patient expects to be discharged to:: Private residence Living Arrangements: Spouse/significant other Available Help at Discharge: Family;Available 24 hours/day Type of Home: House Home Access: Stairs to enter Entrance Stairs-Rails: Left Entrance Stairs-Number of Steps: 4   Home Layout: Two level;Able  to live on main level with bedroom/bathroom Home Equipment: Cane - single point;Rolling Walker (2 wheels);Toilet riser;Shower seat Additional Comments: 2 falls last 6 months using cane.    Prior Function Prior Level of Function : Independent/Modified Independent             Mobility Comments: walks with RW recently for safety ADLs Comments: independent with B/D     Extremity/Trunk Assessment   Upper Extremity Assessment Upper Extremity Assessment: Defer to OT evaluation    Lower Extremity Assessment Lower Extremity Assessment: Overall WFL for tasks assessed    Cervical / Trunk Assessment Cervical / Trunk Assessment: Normal  Communication   Communication Communication: No apparent difficulties    Cognition Arousal: Alert Behavior During Therapy: WFL for tasks assessed/performed   PT - Cognitive impairments: No apparent impairments                         Following commands: Intact       Cueing       General Comments      Exercises     Assessment/Plan    PT Assessment Patient needs continued PT services  PT Problem List Decreased balance;Decreased mobility;Decreased activity tolerance;Decreased knowledge of precautions;Decreased safety awareness;Decreased knowledge of use of DME       PT Treatment Interventions DME instruction;Gait training;Functional mobility training;Therapeutic activities;Therapeutic exercise;Balance training;Patient/family education;Stair training    PT Goals (Current goals can be found in the Care Plan section)  Acute Rehab PT Goals Patient Stated Goal: to go home PT Goal Formulation: With patient Time For Goal Achievement: 06/21/24 Potential to Achieve Goals: Good    Frequency Min 2X/week     Co-evaluation               AM-PAC PT 6 Clicks Mobility  Outcome Measure Help needed turning from your back to your side while in a flat bed without using bedrails?: None Help needed moving from lying on your back  to sitting on the side of a flat bed without using bedrails?: None Help needed moving to and from a bed to a chair (including a wheelchair)?: A Little Help needed standing up from a chair using your arms (e.g., wheelchair or bedside chair)?: A Little Help needed to walk in hospital room?: A Little Help needed climbing 3-5 steps with a railing? : A Lot 6 Click Score: 19    End of Session Equipment Utilized During Treatment: Gait belt;Oxygen  Activity Tolerance: Patient tolerated treatment well Patient left: in chair;with call bell/phone within reach;with chair alarm set Nurse Communication: Mobility status PT Visit Diagnosis: Muscle weakness (generalized) (M62.81)    Time: 9086-9059 PT Time Calculation (min) (ACUTE ONLY): 27 min   Charges:   PT Evaluation $PT Eval Moderate Complexity: 1 Mod PT Treatments $Gait Training: 8-22 mins PT General Charges $$ ACUTE PT VISIT: 1 Visit         Hall Birchard M,PT Acute Rehab Services [phone]   Stephane JULIANNA Bevel 06/07/2024, 10:33 AM

## 2024-06-07 NOTE — Progress Notes (Addendum)
 Mobility Specialist: Progress Note   06/07/24 1600  Mobility  Activity Ambulated with assistance  Level of Assistance Standby assist, set-up cues, supervision of patient - no hands on  Assistive Device Front wheel walker  Distance Ambulated (ft) 250 ft  Activity Response Tolerated well  Mobility Referral Yes  Mobility visit 1 Mobility  Mobility Specialist Start Time (ACUTE ONLY) 1520  Mobility Specialist Stop Time (ACUTE ONLY) 1540  Mobility Specialist Time Calculation (min) (ACUTE ONLY) 20 min    Pre Mobility: SpO2 95% 2LO2 During Mobility: SpO2 86-91% 2LO2 Post Mobility: SpO2 95% 2LO2  Pt received in bed, very pleasant and agreeable to mobility session. SV throughout. SpO2 desat 2x to 86-87% on 2LO2 but pt was able to recover to 89-91% with a brief standing break and PLB. No complaints. Returned to room without fault. Left in bed on 2LO2 with all needs met, call bell in reach.   Ileana Lute Mobility Specialist Please contact via SecureChat or Rehab office at [phone]

## 2024-06-07 NOTE — H&P (Signed)
 History and Physical    Eric Gordon FMW:[id number] DOB: 28-Jul-1931 DOA: 06/06/2024  PCP: [name], MD   Patient coming from: Home   Chief Complaint: SOB    HPI:  Eric Gordon is a 89 y.o. male with hx of  CAD s/p CABG '12, HFpEF, Moderate AS, LV pseudoaneurysm, VT s/p ICD on mexilitine, paroxysmal A-fib on Eliquis , AAA s/p EVAR '23, carotid stenosis s/p R TCAR '22, CKD3, HLD, HTN, Asthma, OSA on CPAP, hx PE post OP CABG, who presents with SOB / URI symptoms.  Reports that symptoms began approximately 1 week ago with cough, sore throat, generalized weakness.  Symptoms continued throughout the week but he had worsening weakness and shortness of breath so sought care.  Notes left lower chest pain which is persistent, nonexertional.  No fevers or chills.  No sick contacts.  Denies dysphagia although was having odynophagia related to sore throat this week.    Review of Systems:  ROS complete and negative except as marked above   Allergies  Allergen Reactions   Ancef [Cefazolin Sodium] Hives   Jardiance [Empagliflozin] Other (See Comments)    Dizziness   Vancomycin  Rash    Prior to Admission medications   Medication Sig Start Date End Date Taking? Authorizing Provider  atorvastatin  (LIPITOR ) 80 MG tablet Take 1 tablet (80 mg total) by mouth daily. 11/01/18  Yes Rolan Ezra RAMAN, MD  Cholecalciferol  (VITAMIN D -3) 25 MCG (1000 UT) CAPS Take 1,000 Units by mouth every evening.   Yes [provider]  edoxaban  (SAVAYSA ) 30 MG TABS tablet Take 30 mg by mouth daily. 08/26/23  Yes [provider]  finasteride  (PROSCAR ) 5 MG tablet Take 5 mg by mouth daily. 08/20/22  Yes [provider]  isosorbide  mononitrate (IMDUR ) 30 MG 24 hr tablet Take 30 mg by mouth at bedtime.   Yes [provider]  levothyroxine  (SYNTHROID ) 50 MCG tablet Take 50 mcg by mouth at bedtime.   Yes [provider]  losartan  (COZAAR ) 50 MG tablet Take 50 mg by mouth  daily.   Yes [provider]  metoprolol  succinate (TOPROL  XL) 25 MG 24 hr tablet Take 1 tablet (25 mg total) by mouth daily. 11/28/22  Yes Lesia Ozell Barter, PA-C  mexiletine (MEXITIL ) 150 MG capsule Take 150 mg by mouth 2 (two) times daily.   Yes [provider]  Multiple Vitamin (MULTIVITAMIN WITH MINERALS) TABS tablet Take 1 tablet by mouth in the morning.   Yes [provider]  pantoprazole  (PROTONIX ) 40 MG tablet Take 40 mg by mouth daily before breakfast.   Yes [provider]  primidone  (MYSOLINE ) 50 MG tablet Take 50 mg by mouth at bedtime.   Yes [provider]  traMADol  (ULTRAM ) 50 MG tablet Take 50 mg by mouth every 6 (six) hours as needed for moderate pain (pain score 4-6).   Yes [provider]  furosemide  (LASIX ) 20 MG tablet Take 2 tablets (40 mg total) by mouth daily. 05/26/23   Glena Harlene HERO, FNP    Past Medical History:  Diagnosis Date   Abdominal aortic aneurysm (AAA) [facility])    Achilles tendinitis of right lower extremity    AICD (automatic cardioverter/defibrillator) present    Arthritis    my whole body (03/19/2018)   Atherosclerosis of coronary artery bypass graft w/o angina pectoris    Atrial fibrillation (HCC)    Barrett's esophagus    CAD (coronary artery disease)    Carotid artery disease (HCC)  CHF (congestive heart failure) (HCC)    Chronic anticoagulation    PE   Chronic lower back pain    Dyspnea    Dysrhythmia    Essential tremor    GERD (gastroesophageal reflux disease)    High cholesterol    Hyperlipidemia    Hyperplasia, prostate    Hypertension    Hypothyroidism    Myocardial infarction [facility])    2 in Jan. 2019   Nail dystrophy    OSA on CPAP    uses CPAP   Osteoarthrosis    Pneumonia    now and once before (03/19/2018)   Pulmonary embolism [facility])    April 2012 after CABG   S/P CABG (coronary artery bypass graft)    Sleep apnea    Spontaneous pneumothorax    left  spontaneous pneumothorax/left pleural effusion, s/p chest tube 01/09/19; thoracentesis x2, s/p left VATS/tacl pleurodesis 04/12/19    Past Surgical History:  Procedure Laterality Date   ABDOMINAL AORTIC ENDOVASCULAR STENT GRAFT N/A 08/06/2022   Procedure: ABDOMINAL AORTIC ENDOVASCULAR STENT GRAFT;  Surgeon: Serene Gaile ORN, MD;  Location: [facility] OR;  Service: Vascular;  Laterality: N/A;   ACHILLES TENDON REPAIR Bilateral    2006 & 2004   CARDIAC CATHETERIZATION  11/2010   CARDIOVERSION N/A 08/03/2023   Procedure: CARDIOVERSION (CATH LAB);  Surgeon: Rolan Ezra RAMAN, MD;  Location: [facility] INVASIVE CV LAB;  Service: Cardiovascular;  Laterality: N/A;   CORONARY ANGIOGRAPHY N/A 10/22/2018   Procedure: CORONARY ANGIOGRAPHY;  Surgeon: Burnard Debby LABOR, MD;  Location: [facility] INVASIVE CV LAB;  Service: Cardiovascular;  Laterality: N/A;   CORONARY ARTERY BYPASS GRAFT  March 2012   CABG X 5   CORONARY PRESSURE/FFR STUDY N/A 10/22/2018   Procedure: INTRAVASCULAR PRESSURE WIRE/FFR STUDY;  Surgeon: Burnard Debby LABOR, MD;  Location: [facility] INVASIVE CV LAB;  Service: Cardiovascular;  Laterality: N/A;   ESOPHAGOGASTRODUODENOSCOPY (EGD) WITH PROPOFOL  N/A 12/10/2020   Procedure: ESOPHAGOGASTRODUODENOSCOPY (EGD) WITH PROPOFOL ;  Surgeon: Legrand Victory LITTIE DOUGLAS, MD;  Location: [facility] ENDOSCOPY;  Service: Gastroenterology;  Laterality: N/A;   ICD IMPLANT N/A 02/25/2022   Procedure: ICD IMPLANT;  Surgeon: Cindie Ole DASEN, MD;  Location: [facility] INVASIVE CV LAB;  Service: Cardiovascular;  Laterality: N/A;   INGUINAL HERNIA REPAIR Left 1980   IR THORACENTESIS ASP PLEURAL SPACE W/IMG GUIDE  02/04/2019   IR THORACENTESIS ASP PLEURAL SPACE W/IMG GUIDE  02/24/2019   KNEE ARTHROSCOPY Left 2006   LEFT HEART CATH AND CORS/GRAFTS ANGIOGRAPHY N/A 10/18/2018   Procedure: LEFT HEART CATH AND CORS/GRAFTS ANGIOGRAPHY;  Surgeon: Court Dorn PARAS, MD;  Location: [facility] INVASIVE CV LAB;  Service: Cardiovascular;  Laterality: N/A;   LEFT HEART CATH AND CORS/GRAFTS  ANGIOGRAPHY N/A 10/21/2018   Procedure: LEFT HEART CATH AND CORS/GRAFTS ANGIOGRAPHY;  Surgeon: Burnard Debby LABOR, MD;  Location: [facility] INVASIVE CV LAB;  Service: Cardiovascular;  Laterality: N/A;   PACEMAKER IMPLANT N/A 02/24/2022   Procedure: PACEMAKER IMPLANT;  Surgeon: Fernande Elspeth BROCKS, MD;  Location: [facility] INVASIVE CV LAB;  Service: Cardiovascular;  Laterality: N/A;   PENILE PROSTHESIS IMPLANT     PLEURAL BIOPSY Left 04/12/2019   Procedure: Pleural Biopsy;  Surgeon: Army Dallas NOVAK, MD;  Location: [facility] OR;  Service: Thoracic;  Laterality: Left;   PLEURAL EFFUSION DRAINAGE Left 04/12/2019   Procedure: Drainage Of Pleural Effusion;  Surgeon: Army Dallas NOVAK, MD;  Location: [facility] OR;  Service: Thoracic;  Laterality: Left;   RIGHT HEART CATHETERIZATION N/A 11/14/2013   Procedure: RIGHT HEART CATH;  Surgeon: Ezra  GORMAN Shuck, MD;  Location: [facility] CATH LAB;  Service: Cardiovascular;  Laterality: N/A;   RIGHT/LEFT HEART CATH AND CORONARY/GRAFT ANGIOGRAPHY N/A 10/15/2022   Procedure: RIGHT/LEFT HEART CATH AND CORONARY/GRAFT ANGIOGRAPHY;  Surgeon: Verlin Lonni BIRCH, MD;  [facility]: [facility] INVASIVE CV LAB;  Service: Cardiovascular;  Laterality: N/A;   SHOULDER OPEN ROTATOR CUFF REPAIR Right 2003   TALC  PLEURODESIS Left 04/12/2019   Procedure: TALC  PLEURADESIS;  Surgeon: Army Dallas NOVAK, MD;  Location: [facility] OR;  Service: Thoracic;  Laterality: Left;   TEE WITHOUT CARDIOVERSION N/A 03/24/2019   Procedure: TRANSESOPHAGEAL ECHOCARDIOGRAM (TEE);  Surgeon: Shuck Ezra GORMAN, MD;  Location: [facility] ENDOSCOPY;  Service: Cardiovascular;  Laterality: N/A;   TEE WITHOUT CARDIOVERSION  04/12/2019   Procedure: Transesophageal Echocardiogram (Tee);  Surgeon: Army Dallas NOVAK, MD;  Location: [facility] OR;  Service: Thoracic;;   THORACOSCOPY Left 04/12/2019   VIDEO BRONCHOSCOPY (N/A ) VIDEO ASSISTED THORACOSCOPY (Left Chest) TALC  PLEURADESIS (Left    TRANSCAROTID ARTERY REVASCULARIZATION  Right 09/13/2020   Procedure: RIGHT TRANSCAROTID ARTERY  REVASCULARIZATION USING 9mm X 40mm ENROUTE TRANSCAROTID STEND SYSTEM;  Surgeon: Serene Gaile ORN, MD;  Location: [facility] OR;  Service: Vascular;  Laterality: Right;   TRANSCAROTID ARTERY REVASCULARIZATION  Left 02/08/2021   Procedure: LEFT TRANSCAROTID ARTERY REVASCULARIZATION;  Surgeon: Serene Gaile ORN, MD;  Location: [facility] OR;  Service: Vascular;  Laterality: Left;   ULTRASOUND GUIDANCE FOR VASCULAR ACCESS  09/13/2020   Procedure: ULTRASOUND GUIDANCE FOR VASCULAR ACCESS;  Surgeon: Serene Gaile ORN, MD;  Location: [facility] OR;  Service: Vascular;;   ULTRASOUND GUIDANCE FOR VASCULAR ACCESS Bilateral 08/06/2022   Procedure: ULTRASOUND GUIDANCE FOR VASCULAR ACCESS;  Surgeon: Serene Gaile ORN, MD;  Location: [facility] OR;  Service: Vascular;  Laterality: Bilateral;   VIDEO ASSISTED THORACOSCOPY Left 04/12/2019   Procedure: VIDEO ASSISTED THORACOSCOPY;  Surgeon: Army Dallas NOVAK, MD;  Location: [facility] OR;  Service: Thoracic;  Laterality: Left;   VIDEO BRONCHOSCOPY N/A 04/12/2019   Procedure: VIDEO BRONCHOSCOPY;  Surgeon: Army Dallas NOVAK, MD;  Location: [facility] OR;  Service: Thoracic;  Laterality: N/A;     reports that he quit smoking about 40 years ago. His smoking use included cigarettes. He started smoking about 75 years ago. He has a 35 pack-year smoking history. He has never used smokeless tobacco. He reports current alcohol  use of about 2.0 standard drinks of alcohol  per week. He reports that he does not use drugs.  Family History  Problem Relation Age of Onset   Coronary artery disease Mother    Hypertension Mother    Heart disease Mother      Physical Exam: Vitals:   06/06/24 2145 06/06/24 2225 06/07/24 0110 06/07/24 0151  BP: (!) 117/51 (!) 108/46 (!) 120/52 (!) 152/69  Pulse: 79 78 79 90  Resp: (!) 26 (!) 25 20 (!) 24  Temp:  99.6 F (37.6 C)    TempSrc:  Oral  Oral  SpO2: 91% 97% 96% 96%  Weight:    89.5 kg  Height:    6' 3 (1.905 m)    Gen: Awake, alert, chronically ill-appearing   CV: Regular,  normal S1, S2, 1/6 SEM Resp: Normal WOB, on nasal cannula, there are Rales in the left greater than right base.  Scattered rhonchi elsewhere.  Frequent coughing which is dry on exam Abd: Flat, normoactive, nontender MSK: Symmetric, no edema  Skin: No rashes or lesions to exposed skin  Neuro: Alert and interactive  Psych: euthymic, appropriate    Data review:   Labs reviewed, notable  for:   Creatinine 1.7 BNP 329 High-sensitivity Trop 18 -> 18 WBC 10 Flu/COVID/RSV negative   Micro:  Results for orders placed or performed during the hospital encounter of 06/06/24  Resp panel by RT-PCR (RSV, Flu A&B, Covid) Anterior Nasal Swab     Status: None   Collection Time: 06/06/24 12:52 PM   Specimen: Anterior Nasal Swab  Result Value Ref Range Status   SARS Coronavirus 2 by RT PCR NEGATIVE NEGATIVE Final   Influenza A by PCR NEGATIVE NEGATIVE Final   Influenza B by PCR NEGATIVE NEGATIVE Final    Comment: (NOTE) The Xpert Xpress SARS-CoV-2/FLU/RSV plus assay is intended as an aid in the diagnosis of influenza from Nasopharyngeal swab specimens and should not be used as a sole basis for treatment. Nasal washings and aspirates are unacceptable for Xpert Xpress SARS-CoV-2/FLU/RSV testing.  Fact Sheet for Patients: BloggerCourse.com  Fact Sheet for Healthcare Providers: SeriousBroker.it  This test is not yet approved or cleared by the [place]  FDA and has been authorized for detection and/or diagnosis of SARS-CoV-2 by FDA under an Emergency Use Authorization (EUA). This EUA will remain in effect (meaning this test can be used) for the duration of the COVID-19 declaration under Section 564(b)(1) of the Act, 21 U.S.C. section 360bbb-3(b)(1), unless the authorization is terminated or revoked.     Resp Syncytial Virus by PCR NEGATIVE NEGATIVE Final    Comment: (NOTE) Fact Sheet for  Patients: BloggerCourse.com  Fact Sheet for Healthcare Providers: SeriousBroker.it  This test is not yet approved or cleared by the [place]  FDA and has been authorized for detection and/or diagnosis of SARS-CoV-2 by FDA under an Emergency Use Authorization (EUA). This EUA will remain in effect (meaning this test can be used) for the duration of the COVID-19 declaration under Section 564(b)(1) of the Act, 21 U.S.C. section 360bbb-3(b)(1), unless the authorization is terminated or revoked.  Performed at [facility], [address], [place], [place] [address]     Imaging reviewed:  CT Angio Chest/Abd/Pel for Dissection W and/or Wo Contrast Result Date: 06/06/2024 EXAM: CTA CHEST, ABDOMEN AND PELVIS WITH AND WITHOUT CONTRAST 06/06/2024 10:40:24 PM TECHNIQUE: CTA of the chest was performed with and without the administration of intravenous contrast. CTA of the abdomen and pelvis was performed with and without the administration of intravenous contrast. Multiplanar reformatted images are provided for review. MIP images are provided for review. Automated exposure control, iterative reconstruction, and/or weight based adjustment of the mA/kV was utilized to reduce the radiation dose to as low as reasonably achievable. COMPARISON: 12/01/2022 CLINICAL HISTORY: 89 y.o. with chest pain and SOB. prior CABG and AAA repair. ALso with generalized abdominal pain. Evaluate for PE and stability of AAA. FINDINGS: VASCULATURE: AORTA: Status post abdominal aortic aneurysm repair. Aneurysm sac measures 4.8 x 5.1 cm, stable since prior examination. Stable fusiform dilation of the descending thoracic aorta measuring 3.9 cm in maximal diameter proximally. Extensive atherosclerotic calcification within the descending thoracic aorta. No dissection. PULMONARY ARTERIES: The central pulmonary arteries are enlarged in keeping with changes of pulmonary arterial  hypertension. No pulmonary embolism within the limits of this exam. GREAT VESSELS OF AORTIC ARCH: No acute finding. No dissection. No arterial occlusion or significant stenosis. CELIAC TRUNK: No acute finding. No occlusion or significant stenosis. SUPERIOR MESENTERIC ARTERY: Demonstrates a less than 50% stenosis at its origin and is distally widely patent. INFERIOR MESENTERIC ARTERY: Occluded at its origin but is reconstituted by the arc of [place]. RENAL ARTERIES: Demonstrate normal  vascular morphology and there is no aneurysm or dissection identified. ILIAC ARTERIES: Bifurcated stent graft repair of the infrarenal abdominal aorta has been performed. Lower extremity arterial inflow is widely patent. Moderate atherosclerotic calcification noted. Internal iliac arteries are patent bilaterally. CHEST: MEDIASTINUM: Mild cardiomegaly. There is both a broad-based aneurysm of the lateral wall of the left ventricle, as well as a narrow neck pseudoaneurysm of the inferior wall of the left ventricle seen at image 128/6 with the pseudoaneurysm sac measuring 1.9 x 3.2 x 3.1 cm. A left subclavian dual-lead pacemaker is identified with leads within the right atrium and right ventricle toward the apex. No pericardial effusion. LUNGS AND PLEURA: Small left pleural effusion. Bibasilar atelectasis. Superimposed bronchial wall thickening noted in keeping with airway inflammation with airway impaction within the left lower lobe. No central obstructing lesion. No pneumothorax. Mild emphysema. THORACIC BONES AND SOFT TISSUES: No acute bone or soft tissue abnormality. ABDOMEN AND PELVIS: LIVER: The liver is unremarkable. GALLBLADDER AND BILE DUCTS: Gallbladder is unremarkable. No biliary ductal dilatation. SPLEEN: The spleen is unremarkable. PANCREAS: The pancreas is unremarkable. ADRENAL GLANDS: Bilateral adrenal glands demonstrate no acute abnormality. KIDNEYS, URETERS AND BLADDER: Simple cysts within the kidneys bilaterally for which  no follow up imaging is recommended. No stones in the kidneys or ureters. No hydronephrosis. No perinephric or periureteral stranding. Urinary bladder is unremarkable. GI AND BOWEL: Severe sigmoid diverticulosis. Scattered diverticula are seen throughout the remainder of the colon. The stomach, small bowel, and large bowel are otherwise unremarkable. Appendix normal. Small hiatal hernia. REPRODUCTIVE: Mild prostatic hypertrophy. Penile prosthesis in place. PERITONEUM AND RETROPERITONEUM: No ascites or free air. LYMPH NODES: No lymphadenopathy. ABDOMINAL BONES AND SOFT TISSUES: Osseous structures are age appropriate. No acute bone abnormality. No lytic or blastic bone lesion. Small fat-containing bilateral inguinal hernias. IMPRESSION: 1. No pulmonary embolism within the limits of this exam. 2. Status post abdominal aortic aneurysm repair with stable aneurysm sac measuring 4.8 x 5.1 cm. 3. Stable fusiform dilation of the descending thoracic aorta measuring 3.9 cm in maximal diameter proximally. No dissection. Follow-up imaging will be predicated on the patient's clinical comorbidities. 4. Mild cardiomegaly with broad-based aneurysm of the lateral wall of the left ventricle and narrow neck pseudoaneurysm of the inferior wall of the left ventricle. No pericardial effusion. 5. Small left pleural effusion and bibasilar atelectasis with airway inflammation and impaction within the left lower lobe. No pneumothorax. 6. Severe sigmoid diverticulosis without evidence of diverticulitis. Electronically signed by: Dorethia Molt MD 06/06/2024 11:04 PM EDT RP Workstation: [id number]   DG Chest 2 View Result Date: 06/06/2024 CLINICAL DATA:  Chest pain with shortness of breath. Back pain, weakness and body aches for 1 week. EXAM: CHEST - 2 VIEW COMPARISON:  Radiographs 12/01/2022 and 10/14/2022. Chest CT 12/01/2022. FINDINGS: The heart size and mediastinal contours are stable post median sternotomy and CABG. Left subclavian  pacemaker leads project over the right atrium and right ventricle. Unchanged chronic left pleural effusion, left basilar and biapical scarring. There is no confluent airspace disease or suspicious pulmonary nodularity. Postsurgical changes in the distal right clavicle. No acute osseous findings are seen. IMPRESSION: Stable chest. No acute cardiopulmonary process. Chronic left pleural effusion and scarring. Electronically Signed   By: Elsie Perone M.D.   On: 06/06/2024 14:07    EKG:  Personally reviewed, A-paced, LAD, LAFB RBBB, discordant ST changes, without overt ischemia  ED Course:  Treated with aztreonam , azithromycin  for community-acquired pneumonia    Assessment/Plan:  89 y.o. male with hx  CAD s/p CABG '12, HFpEF, Moderate AS, LV pseudoaneurysm, VT s/p ICD on mexilitine, paroxysmal A-fib on Eliquis , AAA s/p EVAR '23, carotid stenosis s/p R TCAR '22, CKD3, HLD, HTN, hypothyroidism, asthma, OSA on CPAP, hx PE post OP CABG, who presents with SOB / URI symptoms. Found to have community acquired pneumonia     Community-acquired pneumonia Possible aspiration pneumonia P/w 1 week cough, sore throat, myalgias, weakness, nonexertional left lower chest pain, + progressive SOB. desatted to 88% on room air placed on 2 L O2.  Tachypneic in the mid 20s, afebrile.  WBC 10. imaging with small left pleural effusion, airway inflammation and impaction in the left lower lobe.  Findings concerning for community-acquired pneumonia, possible aspiration.  - Switch antibiotics to Unasyn , azithromycin  - Check sputum culture, RVP - Albuterol  prn, guaifenesin , incentive spirometry, out of bed to chair. - Swallow eval and SLP eval considering imaging findings - Home O2 eval prior to discharge  Chronic medical problems: CAD s/p CABG ' 12: On edoxaban , atorvastatin  HFpEF: Without acute exacerbation.  BNP similar to prior.  Continue home Lasix  40 mg daily Moderate AS: Noted, outpatient surveillance LV  pseudoaneurysm: Chronic also seen on CT VT s/p ICD: Biotronik ICD, continue home mexiletine Paroxysmal A-fib: Continue home edoxaban , metoprolol  Aortic aneurysm s/p EVAR ' 23: Noted, stable aneurysm size on imaging, outpatient surveillance. Carotid stenosis s/p R TCAR '22: See CAD CKD 3: Baseline creatinine ~ 1.7  Hyperlipidemia: See CAD Hypertension: Continue home losartan , ISMN metoprolol  per above Hypothyroidism: Continue home levothyroxine  Asthma: Nebs prn OSA: CPAP nightly History PE postop CABG: On AC for A-fib BPH: Continue home finasteride  GERD: Continue pantoprazole  ET: Continue primidone   Body mass index is 24.66 kg/m.    DVT prophylaxis:  DOAC Code Status:  Full Code Diet:  Diet Orders (From admission, onward)    None      Family Communication:  None   Consults:  None   Admission status:   Observation, Telemetry bed  Severity of Illness: The appropriate patient status for this patient is OBSERVATION. Observation status is judged to be reasonable and necessary in order to provide the required intensity of service to ensure the patient's safety. The patient's presenting symptoms, physical exam findings, and initial radiographic and laboratory data in the context of their medical condition is felt to place them at decreased risk for further clinical deterioration. Furthermore, it is anticipated that the patient will be medically stable for discharge from the hospital within 2 midnights of admission.    Dorn Dawson, MD [facility]  How to contact the [facility] Attending or Consulting provider 7A - 7P or covering provider during after hours 7P -7A, for this patient.  Check the care team in [facility] and look for a) attending/consulting [facility] provider listed and b) the [facility] team listed Log into [email or web address] and use [place]'s universal password to access. If you do not have the password, please contact the hospital operator. Locate the [facility] provider you are looking for  under [facility] and page to a number that you can be directly reached. If you still have difficulty reaching the provider, please page the [facility] (Director on Call) for the Hospitalists listed on amion for assistance.  06/07/2024, 4:35 AM

## 2024-06-08 DIAGNOSIS — J9601 Acute respiratory failure with hypoxia: Secondary | ICD-10-CM | POA: Diagnosis not present

## 2024-06-08 DIAGNOSIS — B348 Other viral infections of unspecified site: Secondary | ICD-10-CM

## 2024-06-08 LAB — RENAL FUNCTION PANEL
Albumin: 2.9 g/dL — ABNORMAL LOW (ref 3.5–5.0)
Anion gap: 14 (ref 5–15)
BUN: 35 mg/dL — ABNORMAL HIGH (ref 8–23)
CO2: 26 mmol/L (ref 22–32)
Calcium: 9.2 mg/dL (ref 8.9–10.3)
Chloride: 100 mmol/L (ref 98–111)
Creatinine, Ser: 1.88 mg/dL — ABNORMAL HIGH (ref 0.61–1.24)
GFR, Estimated: 34 mL/min — ABNORMAL LOW (ref >60)
Glucose, Bld: 103 mg/dL — ABNORMAL HIGH (ref 70–99)
Phosphorus: 4.3 mg/dL (ref 2.5–4.6)
Potassium: 4.1 mmol/L (ref 3.5–5.1)
Sodium: 140 mmol/L (ref 135–145)

## 2024-06-08 LAB — CBC
HCT: 35.8 % — ABNORMAL LOW (ref 39.0–52.0)
Hemoglobin: 11.4 g/dL — ABNORMAL LOW (ref 13.0–17.0)
MCH: 30.7 pg (ref 26.0–34.0)
MCHC: 31.8 g/dL (ref 30.0–36.0)
MCV: 96.5 fL (ref 80.0–100.0)
Platelets: 148 K/uL — ABNORMAL LOW (ref 150–400)
RBC: 3.71 MIL/uL — ABNORMAL LOW (ref 4.22–5.81)
RDW: 14.7 % (ref 11.5–15.5)
WBC: 7.4 K/uL (ref 4.0–10.5)
nRBC: 0 % (ref 0.0–0.2)

## 2024-06-08 LAB — PROCALCITONIN: Procalcitonin: <0.1 ng/mL

## 2024-06-08 LAB — MAGNESIUM: Magnesium: 1.7 mg/dL (ref 1.7–2.4)

## 2024-06-08 MED ORDER — GUAIFENESIN ER 600 MG PO TB12: 600.0000 mg | ORAL_TABLET | Freq: Two times a day (BID) | ORAL | Status: AC

## 2024-06-08 NOTE — Progress Notes (Signed)
SATURATION QUALIFICATIONS: (This note is used to comply with regulatory documentation for home oxygen)  Patient Saturations on Room Air at Rest = 95%  Patient Saturations on Room Air while Ambulating = 94%   

## 2024-06-08 NOTE — Progress Notes (Signed)
 Speech Language Pathology Treatment: Dysphagia  Patient Details Name: Eric Gordon MRN: [id number] DOB: 03/12/1931 Today's Date: 06/08/2024 Time: 9153-9144 SLP Time Calculation (min) (ACUTE ONLY): 9 min  Assessment / Plan / Recommendation Clinical Impression  Pt upright on EOB eating breakfast. Continue to note intermittent, coarse coughing during session as described in initial evaluation. Pt continues to endorse that coughing is not related to PO intake or sensation of GERD. We discussed option to objectively assess oropharyngeal swallow function and screen esophageal function with MBSS; however, pt politely declined. Pt reported hx of pneumonia ~3x over the past 10 years. SLP encouraged pt to reconsider MBSS if he shows symptoms of respiratory infection following treatment for his current pneumonia. Pt verbalized good understanding.   Pt tolerating least restrictive diet at this time. All swallow goals met. SLP will sign off. Please re-consult SLP if pt exhibits concerns for aspiration with PO intake.    HPI HPI: 89 yo male presenting 9/15 with SOB/URI symptoms for one week PTA. Found to have PNA. PMH includes: Barrett's esophagus, GERD, PNA, CAD s/p CABG '12, HFpEF, Moderate AS, LV pseudoaneurysm, VT s/p ICD on mexilitine, paroxysmal A-fib on Eliquis , AAA s/p EVAR '23, carotid stenosis s/p R TCAR '22, CKD3, HLD, HTN, hypothyroidism, asthma, OSA on CPAP, hx PE post OP CABG      SLP Plan  All goals met;Discharge SLP treatment due to (comment)          Recommendations  Diet recommendations: Regular;Thin liquid Liquids provided via: Cup;Straw Medication Administration: Whole meds with liquid Supervision: Patient able to self feed Compensations: Slow rate;Small sips/bites;Follow solids with liquid Postural Changes and/or Swallow Maneuvers: Seated upright 90 degrees                  Oral care BID   None (per SLP) Dysphagia, unspecified (R13.10)     All goals  met;Discharge SLP treatment due to (comment)     [name]  06/08/2024, 11:39 AM

## 2024-06-08 NOTE — Progress Notes (Signed)
   06/08/24 0930  Mobility  Activity Ambulated with assistance  Level of Assistance Standby assist, set-up cues, supervision of patient - no hands on  Assistive Device Front wheel walker  Distance Ambulated (ft) 500 ft  Activity Response Tolerated fair  Mobility Referral Yes  Mobility visit 1 Mobility  Mobility Specialist Start Time (ACUTE ONLY) 0930  Mobility Specialist Stop Time (ACUTE ONLY) 0942  Mobility Specialist Time Calculation (min) (ACUTE ONLY) 12 min   Mobility Specialist: Progress Note  During Mobility:  SpO2 94-96% RA Post-Mobility:    SpO2 100% RA  Pt agreeable to mobility session - received standing in room. C/o lower back pain and feeling winded after walking. Returned to chair with all needs met - call bell within reach.   Virgle Boards, BS Mobility Specialist Please contact via SecureChat or  Rehab office at [phone].

## 2024-06-08 NOTE — Evaluation (Signed)
 Occupational Therapy Evaluation Patient Details Name: Eric Gordon MRN: [id number] DOB: Feb 22, 1931 Today's Date: 06/08/2024   History of Present Illness   Eric Gordon is a 89 y.o. male presents with SOB / URI symptoms.  Reports that symptoms began approximately 1 week ago with cough, sore throat, generalized weakness.  Symptoms continued throughout the week but he had worsening weakness and shortness of breath so sought care.  Positive for PNA. PMH: CAD s/p CABG '12, HFpEF, Moderate AS, LV pseudoaneurysm, VT s/p ICD on mexilitine, paroxysmal A-fib on Eliquis , AAA s/p EVAR '23, carotid stenosis s/p R TCAR '22, CKD3, HLD, HTN, Asthma, OSA on CPAP, hx PE post OP CABG     Clinical Impressions Sartaj was evaluated s/p the above admission list. He is mod I for mobility, ADLs and IADLs at baseline. Upon evaluation the pt was limited by coughing, decreased activity tolerance, SOB with activity and generalized weakness. Overall he needs CGA fro mobility with RW and cues for activity tolerance/PLB/pacing. Due to the deficits listed below the pt also needs up to CGA for ADLs with increased time. Pt will benefit from continued acute OT services to d/c home without OT follow up.      If plan is discharge home, recommend the following:   Assistance with cooking/housework;Two people to help with bathing/dressing/bathroom;Assist for transportation;Help with stairs or ramp for entrance     Functional Status Assessment   Patient has had a recent decline in their functional status and demonstrates the ability to make significant improvements in function in a reasonable and predictable amount of time.     Equipment Recommendations   None recommended by OT      Precautions/Restrictions   Precautions Precautions: Fall Recall of Precautions/Restrictions: Intact Precaution/Restrictions Comments: droplet Restrictions Weight Bearing Restrictions Per Provider Order: No      Mobility Bed Mobility               General bed mobility comments: OOB on arrival    Transfers Overall transfer level: Needs assistance Equipment used: Rolling walker (2 wheels) Transfers: Sit to/from Stand Sit to Stand: Contact guard assist                  Balance Overall balance assessment: Needs assistance Sitting-balance support: No upper extremity supported, Feet supported Sitting balance-Leahy Scale: Fair     Standing balance support: Bilateral upper extremity supported, During functional activity, Reliant on assistive device for balance Standing balance-Leahy Scale: Poor Standing balance comment: relies on RW                           ADL either performed or assessed with clinical judgement   ADL Overall ADL's : Needs assistance/impaired Eating/Feeding: Independent   Grooming: Supervision/safety;Standing   Upper Body Bathing: Set up;Sitting   Lower Body Bathing: Contact guard assist;Sit to/from stand   Upper Body Dressing : Set up;Sitting   Lower Body Dressing: Contact guard assist;Sit to/from stand   Toilet Transfer: Ambulation;Contact guard assist   Toileting- Clothing Manipulation and Hygiene: Supervision/safety;Sitting/lateral lean       Functional mobility during ADLs: Contact guard assist;Rolling walker (2 wheels) General ADL Comments: pt is likely near his functional baseline, he reports feeling better. no overt LOB or safety concern     Vision Baseline Vision/History: 1 Wears glasses Vision Assessment?: No apparent visual deficits     Perception Perception: Within Functional Limits       Praxis Praxis: [facility]  Pertinent Vitals/Pain Pain Assessment Pain Assessment: No/denies pain Faces Pain Scale: No hurt Pain Intervention(s): Monitored during session     Extremity/Trunk Assessment Upper Extremity Assessment Upper Extremity Assessment: Generalized weakness;Overall [facility] for tasks assessed   Lower Extremity  Assessment Lower Extremity Assessment: Defer to PT evaluation   Cervical / Trunk Assessment Cervical / Trunk Assessment: Normal   Communication Communication Communication: No apparent difficulties   Cognition Arousal: Alert Behavior During Therapy: WFL for tasks assessed/performed Cognition: No apparent impairments       Following commands: Intact       Cueing  General Comments   Cueing Techniques: Verbal cues  VSS on RA, SpO2 >94%           Home Living Family/patient expects to be discharged to:: Private residence Living Arrangements: Spouse/significant other Available Help at Discharge: Family;Available 24 hours/day Type of Home: House Home Access: Stairs to enter [employer] of Steps: 4 Entrance Stairs-Rails: Left Home Layout: Two level;Able to live on main level with bedroom/bathroom     Bathroom Shower/Tub: Walk-in shower   Bathroom Toilet: Handicapped height     Home Equipment: Cane - single Librarian, academic (2 wheels);Toilet riser;Shower seat   Additional Comments: 2 falls last 6 months using cane.      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    OT Problem List: Decreased activity tolerance;Decreased strength;Decreased knowledge of precautions;Cardiopulmonary status limiting activity   OT Treatment/Interventions: Self-care/ADL training;Therapeutic exercise;DME and/or AE instruction;Energy conservation;Therapeutic activities;Patient/family education;Balance training      OT Goals(Current goals can be found in the care plan section)   Acute Rehab OT Goals Patient Stated Goal: home soon OT Goal Formulation: With patient Time For Goal Achievement: 06/22/24 Potential to Achieve Goals: Good ADL Goals Pt Will Perform Lower Body  Dressing: with modified independence;sit to/from stand Pt Will Transfer to Toilet: with modified independence;ambulating Pt/caregiver will Perform Home Exercise Program: Increased ROM;Increased strength;Both right and left upper extremity Additional ADL Goal #1: Pt will indep reall at least 3 energy conservation technqiues to increase safety and tolerance for ADLs   OT Frequency:  Min 2X/week       AM-PAC OT 6 Clicks Daily Activity     Outcome Measure Help from another person eating meals?: None Help from another person taking care of personal grooming?: A Little Help from another person toileting, which includes using toliet, bedpan, or urinal?: A Little Help from another person bathing (including washing, rinsing, drying)?: A Little Help from another person to put on and taking off regular upper body clothing?: A Little Help from another person to put on and taking off regular lower body clothing?: A Little 6 Click Score: 19   End of Session Equipment Utilized During Treatment: Rolling walker (2 wheels) Nurse Communication: Mobility status  Activity Tolerance: Patient tolerated treatment well Patient left: in chair;with call bell/phone within reach  OT Visit Diagnosis: Unsteadiness on feet (R26.81);Other abnormalities of gait and mobility (R26.89);Muscle weakness (generalized) (M62.81)                Time: 8984-8968 OT Time Calculation (min): 16 min Charges:  OT General Charges $OT Visit: 1 Visit OT Evaluation $OT Eval Moderate Complexity: 1 Mod  Lucie Kendall, OTR/L Acute Rehabilitation Services Office [phone] Secure Chat Communication Preferred   Lucie JONETTA Kendall  06/08/2024, 10:37 AM

## 2024-06-08 NOTE — Plan of Care (Signed)
  Problem: SLP Dysphagia Goals Goal: Patient will utilize recommended strategies Description: Patient will utilize recommended strategies during swallow to increase swallowing safety with 06/08/2024 1135 by Henry Peyton PARAS, CCC-SLP Outcome: Completed/Met 06/08/2024 1135 by Henry Peyton PARAS, CCC-SLP Flowsheets (Taken 06/07/2024 1033 by Rollen Leita SQUIBB, CCC-SLP) Patient will utilize recommended strategies during swallow to increase swallowing safety with: modified independence

## 2024-06-08 NOTE — Progress Notes (Addendum)
 Patient satuations above 94% on room air all morning  no shortness of breath when moving around the room per patient a little short of breath with mobility but non since but with mobility O@ reamained 94.

## 2024-06-08 NOTE — Progress Notes (Signed)
 DISCHARGE NOTE HOME Eric Gordon to be discharged Home per MD order. Discussed prescriptions and follow up appointments with the patient. Prescriptions given to patient; medication list explained in detail. Patient verbalized understanding.  Skin clean, dry and intact without evidence of skin break down, no evidence of skin tears noted. IV catheter discontinued intact. Site without signs and symptoms of complications. Dressing and pressure applied. Pt denies pain at the site currently. No complaints noted.  See Lda for insicions at discharge Patient free of lines, drains, and wounds.   An After Visit Summary (AVS) was printed and given to the patient. Patient escorted via wheelchair, and discharged home via private auto.  [name], RN

## 2024-06-08 NOTE — Discharge Summary (Signed)
 Physician Discharge Summary  Eric Gordon FMW:[id number] DOB: 07-Apr-1931 DOA: 06/06/2024  PCP: [name], MD  Admit date: 06/06/2024 Discharge date: 06/08/24  Admitted From: Home Disposition:  Home Recommendations for Outpatient Follow-up:  Outpatient follow-up with PCP in 1 to 2-week Assess blood pressure, CMP and CBC at follow-up Please follow up on the following pending results: None  Home Health: [facility] PT/OT Equipment/Devices: No need identified  Discharge Condition: Stable CODE STATUS: Full code Diet Orders (From admission, onward)     Start     Ordered   06/07/24 0502  Diet 2 gram sodium Room service appropriate? Yes; Fluid consistency: Thin  Diet effective now       Comments: OK for diet if passes swallow  Question Answer Comment  Room service appropriate? Yes   Fluid consistency: Thin      06/07/24 0502             Follow-up Information     [name], MD. Schedule an appointment as soon as possible for a visit in 1 week(s).   Specialty: Internal Medicine Contact information: [address] [place] [address] [phone]                 Hospital course 89 y.o. male with hx of  CAD s/p CABG in 2012, HFpEF, Moderate AS, LV pseudoaneurysm, VT s/p ICD on mexilitine, paroxysmal A-fib on Eliquis , AAA s/p EVAR '23, carotid stenosis s/p R TCAR '22, CKD3, HLD, HTN, Asthma, OSA on CPAP, hx PE after CABG, who presents with worsening shortness of breath, cough, sore throat, generalized weakness for about a week, and admitted with LLL pneumonia with concern for possible aspiration pneumonia.  No dysphagia but odynophagia likely from sore throat that has resolved.   In ED, brief desaturation to 89% but no oxygen  requirement.  WBC 10.8. Cr 1.74.  BUN 28.  BNP 330.  Troponin 18 x 2.  COVID-19, influenza and RSV PCR nonreactive.  CXR without acute finding but chronic left pleural effusion and scarring.  CT angio chest negative for PE but small left  pleural effusion and bibasilar atelectasis with airway inflammation and impaction with LLL and stable AAA, descending thoracic aorta and severe sigmoid diverticulosis without evidence of diverticulitis.  Sputum culture and a 20 pathogen RVP ordered.  Started on Unasyn  and Zithromax  and admitted.  The next day, symptoms improved.  Patient was continued on Unasyn  and Zithromax .  On the day of discharge, RVP with rhinovirus.  Antibiotics discontinued.  Patient felt well other than intermittent cough.  Respiratory failure resolved.  He was ambulated to room air and maintained appropriate saturation.  Chest PT/OT ordered as recommended by therapy.    See individual problem list below for more.   Problems addressed during this hospitalization Rhinovirus infection: Some concern about aspiration due to the report of odynophagia when he had a sore throat.  Patient denies dysphagia.  Odynophagia and sore throat has resolved.  CT raises concern for LLL infiltrate or atelectasis and small left pleural effusion.  Pleural effusion seems to be chronic.  He had mild leukocytosis that has resolved now.  RVP positive for rhinovirus.  Clinically improved and liberated of oxygen .  Antibiotics discontinued.  Community-acquired pneumonia ruled out. -     CAD s/p CABG in 2012: Stable.  No chest pain.  On edoxaban , atorvastatin  Chronic HFpEF:   BNP similar to prior.  Continue home Lasix  and Aldactone . Moderate AS: Noted, outpatient surveillance LV pseudoaneurysm: Chronic also seen on CT VT  s/p ICD: Biotronik ICD, continue home mexiletine Paroxysmal A-fib: Continue home edoxaban , metoprolol  Aortic aneurysm s/p EVAR ' 23: Stable on CT.  Outpatient surveillance. Carotid stenosis s/p R TCAR '22: See CAD CKD-3B: Baseline creatinine ~ 1.7  Hyperlipidemia: See CAD Hypertension: Soft BP.  Not taking losartan  and Imdur  at home.  Continue Toprol  and diuretics Hypothyroidism: Continue home levothyroxine  Asthma: Nebs prn OSA:  CPAP nightly History PE postop CABG: On AC for A-fib BPH: Continue home finasteride  GERD: Continue pantoprazole  Essential tremor: Continue primidone    Body mass index is 24.66 kg/m.           Consultations: None  Time spent 35  minutes  Vital signs Vitals:   06/07/24 1543 06/07/24 1948 06/08/24 0424 06/08/24 0843  BP: (!) 113/50 (!) 95/41 (!) 97/48 (!) 108/57  Pulse: 75 73 68 71  Temp: (!) 97.5 F (36.4 C) 98.1 F (36.7 C) 97.6 F (36.4 C)   Resp: 16 18 14 20   Height:      Weight:      SpO2: 97% 96% 96% 94%  TempSrc:      BMI (Calculated):         Discharge exam  GENERAL: No apparent distress.  Nontoxic. HEENT: MMM.  Vision and hearing grossly intact.  NECK: Supple.  No apparent JVD.  RESP:  No IWOB.  Fair aeration bilaterally. CVS:  RRR. Heart sounds normal.  ABD/GI/GU: BS+. Abd soft, NTND.  MSK/EXT:  Moves extremities. No apparent deformity. No edema.  SKIN: no apparent skin lesion or wound NEURO: Awake and alert. Oriented appropriately.  No apparent focal neuro deficit. PSYCH: Calm. Normal affect.   Discharge Instructions Discharge Instructions     Discharge instructions   Complete by: As directed    It has been a pleasure taking care of you!  You were hospitalized due to rhinovirus infection (common cold virus infection).  There is no specific treatment for this particular virus but most people recover on their own after few days.  Maintain good hydration.  You may try Mucinex  as needed for cough.  Follow-up with your primary care doctor in 1 to 2 weeks or sooner if needed.   Take care,   Increase activity slowly   Complete by: As directed       Allergies as of 06/08/2024       Reactions   Ancef [cefazolin Sodium] Hives   Jardiance [empagliflozin] Other (See Comments)   Dizziness   Vancomycin  Rash        Medication List     STOP taking these medications    isosorbide  mononitrate 30 MG 24 hr tablet Commonly known as: IMDUR     losartan  50 MG tablet Commonly known as: COZAAR        TAKE these medications    atorvastatin  80 MG tablet Commonly known as: LIPITOR  Take 1 tablet (80 mg total) by mouth daily. What changed: when to take this   cetirizine 10 MG tablet Commonly known as: ZYRTEC Take 10 mg by mouth daily.   edoxaban  30 MG Tabs tablet Commonly known as: SAVAYSA  Take 30 mg by mouth daily.   finasteride  5 MG tablet Commonly known as: PROSCAR  Take 5 mg by mouth at bedtime.   furosemide  20 MG tablet Commonly known as: LASIX  Take 2 tablets (40 mg total) by mouth daily. What changed: how much to take   guaiFENesin  600 MG 12 hr tablet Commonly known as: MUCINEX  Take 1 tablet (600 mg total) by mouth 2 (two) times daily for  5 days.   levothyroxine  50 MCG tablet Commonly known as: SYNTHROID  Take 50 mcg by mouth in the morning.   metoprolol  succinate 25 MG 24 hr tablet Commonly known as: Toprol  XL Take 1 tablet (25 mg total) by mouth daily. What changed: when to take this   mexiletine 150 MG capsule Commonly known as: MEXITIL  Take 150 mg by mouth 2 (two) times daily.   multivitamin with minerals Tabs tablet Take 1 tablet by mouth in the morning.   pantoprazole  40 MG tablet Commonly known as: PROTONIX  Take 40 mg by mouth daily before breakfast.   primidone  50 MG tablet Commonly known as: MYSOLINE  Take 50 mg by mouth at bedtime.   spironolactone  25 MG tablet Commonly known as: ALDACTONE  Take 25 mg by mouth daily.   traMADol  50 MG tablet Commonly known as: ULTRAM  Take 50 mg by mouth daily as needed for moderate pain (pain score 4-6).   Vitamin D -3 25 MCG (1000 UT) Caps Take 1,000 Units by mouth at bedtime.         Procedures/Studies:   CT Angio Chest/Abd/Pel for Dissection W and/or Wo Contrast Result Date: 06/06/2024 EXAM: CTA CHEST, ABDOMEN AND PELVIS WITH AND WITHOUT CONTRAST 06/06/2024 10:40:24 PM TECHNIQUE: CTA of the chest was performed with and without the  administration of intravenous contrast. CTA of the abdomen and pelvis was performed with and without the administration of intravenous contrast. Multiplanar reformatted images are provided for review. MIP images are provided for review. Automated exposure control, iterative reconstruction, and/or weight based adjustment of the mA/kV was utilized to reduce the radiation dose to as low as reasonably achievable. COMPARISON: 12/01/2022 CLINICAL HISTORY: 89 y.o. with chest pain and SOB. prior CABG and AAA repair. ALso with generalized abdominal pain. Evaluate for PE and stability of AAA. FINDINGS: VASCULATURE: AORTA: Status post abdominal aortic aneurysm repair. Aneurysm sac measures 4.8 x 5.1 cm, stable since prior examination. Stable fusiform dilation of the descending thoracic aorta measuring 3.9 cm in maximal diameter proximally. Extensive atherosclerotic calcification within the descending thoracic aorta. No dissection. PULMONARY ARTERIES: The central pulmonary arteries are enlarged in keeping with changes of pulmonary arterial hypertension. No pulmonary embolism within the limits of this exam. GREAT VESSELS OF AORTIC ARCH: No acute finding. No dissection. No arterial occlusion or significant stenosis. CELIAC TRUNK: No acute finding. No occlusion or significant stenosis. SUPERIOR MESENTERIC ARTERY: Demonstrates a less than 50% stenosis at its origin and is distally widely patent. INFERIOR MESENTERIC ARTERY: Occluded at its origin but is reconstituted by the arc of [place]. RENAL ARTERIES: Demonstrate normal vascular morphology and there is no aneurysm or dissection identified. ILIAC ARTERIES: Bifurcated stent graft repair of the infrarenal abdominal aorta has been performed. Lower extremity arterial inflow is widely patent. Moderate atherosclerotic calcification noted. Internal iliac arteries are patent bilaterally. CHEST: MEDIASTINUM: Mild cardiomegaly. There is both a broad-based aneurysm of the lateral wall of the  left ventricle, as well as a narrow neck pseudoaneurysm of the inferior wall of the left ventricle seen at image 128/6 with the pseudoaneurysm sac measuring 1.9 x 3.2 x 3.1 cm. A left subclavian dual-lead pacemaker is identified with leads within the right atrium and right ventricle toward the apex. No pericardial effusion. LUNGS AND PLEURA: Small left pleural effusion. Bibasilar atelectasis. Superimposed bronchial wall thickening noted in keeping with airway inflammation with airway impaction within the left lower lobe. No central obstructing lesion. No pneumothorax. Mild emphysema. THORACIC BONES AND SOFT TISSUES: No acute bone or soft tissue abnormality. ABDOMEN  AND PELVIS: LIVER: The liver is unremarkable. GALLBLADDER AND BILE DUCTS: Gallbladder is unremarkable. No biliary ductal dilatation. SPLEEN: The spleen is unremarkable. PANCREAS: The pancreas is unremarkable. ADRENAL GLANDS: Bilateral adrenal glands demonstrate no acute abnormality. KIDNEYS, URETERS AND BLADDER: Simple cysts within the kidneys bilaterally for which no follow up imaging is recommended. No stones in the kidneys or ureters. No hydronephrosis. No perinephric or periureteral stranding. Urinary bladder is unremarkable. GI AND BOWEL: Severe sigmoid diverticulosis. Scattered diverticula are seen throughout the remainder of the colon. The stomach, small bowel, and large bowel are otherwise unremarkable. Appendix normal. Small hiatal hernia. REPRODUCTIVE: Mild prostatic hypertrophy. Penile prosthesis in place. PERITONEUM AND RETROPERITONEUM: No ascites or free air. LYMPH NODES: No lymphadenopathy. ABDOMINAL BONES AND SOFT TISSUES: Osseous structures are age appropriate. No acute bone abnormality. No lytic or blastic bone lesion. Small fat-containing bilateral inguinal hernias. IMPRESSION: 1. No pulmonary embolism within the limits of this exam. 2. Status post abdominal aortic aneurysm repair with stable aneurysm sac measuring 4.8 x 5.1 cm. 3.  Stable fusiform dilation of the descending thoracic aorta measuring 3.9 cm in maximal diameter proximally. No dissection. Follow-up imaging will be predicated on the patient's clinical comorbidities. 4. Mild cardiomegaly with broad-based aneurysm of the lateral wall of the left ventricle and narrow neck pseudoaneurysm of the inferior wall of the left ventricle. No pericardial effusion. 5. Small left pleural effusion and bibasilar atelectasis with airway inflammation and impaction within the left lower lobe. No pneumothorax. 6. Severe sigmoid diverticulosis without evidence of diverticulitis. Electronically signed by: Dorethia Molt MD 06/06/2024 11:04 PM EDT RP Workstation: [id number]   DG Chest 2 View Result Date: 06/06/2024 CLINICAL DATA:  Chest pain with shortness of breath. Back pain, weakness and body aches for 1 week. EXAM: CHEST - 2 VIEW COMPARISON:  Radiographs 12/01/2022 and 10/14/2022. Chest CT 12/01/2022. FINDINGS: The heart size and mediastinal contours are stable post median sternotomy and CABG. Left subclavian pacemaker leads project over the right atrium and right ventricle. Unchanged chronic left pleural effusion, left basilar and biapical scarring. There is no confluent airspace disease or suspicious pulmonary nodularity. Postsurgical changes in the distal right clavicle. No acute osseous findings are seen. IMPRESSION: Stable chest. No acute cardiopulmonary process. Chronic left pleural effusion and scarring. Electronically Signed   By: Elsie Perone M.D.   On: 06/06/2024 14:07   CUP PACEART REMOTE DEVICE CHECK Result Date: 05/26/2024 ICD scheduled remote reviewed. Normal device function.  Presenting rhythm: AP-VS Next remote 91 days. AB, CVRS      The results of significant diagnostics from this hospitalization (including imaging, microbiology, ancillary and laboratory) are listed below for reference.     Microbiology: Recent Results (from the past 240 hours)  Resp panel by RT-PCR  (RSV, Flu A&B, Covid) Anterior Nasal Swab     Status: None   Collection Time: 06/06/24 12:52 PM   Specimen: Anterior Nasal Swab  Result Value Ref Range Status   SARS Coronavirus 2 by RT PCR NEGATIVE NEGATIVE Final   Influenza A by PCR NEGATIVE NEGATIVE Final   Influenza B by PCR NEGATIVE NEGATIVE Final    Comment: (NOTE) The Xpert Xpress SARS-CoV-2/FLU/RSV plus assay is intended as an aid in the diagnosis of influenza from Nasopharyngeal swab specimens and should not be used as a sole basis for treatment. Nasal washings and aspirates are unacceptable for Xpert Xpress SARS-CoV-2/FLU/RSV testing.  Fact Sheet for Patients: BloggerCourse.com  Fact Sheet for Healthcare Providers: SeriousBroker.it  This test is not yet approved or  cleared by the [place]  FDA and has been authorized for detection and/or diagnosis of SARS-CoV-2 by FDA under an Emergency Use Authorization (EUA). This EUA will remain in effect (meaning this test can be used) for the duration of the COVID-19 declaration under Section 564(b)(1) of the Act, 21 U.S.C. section 360bbb-3(b)(1), unless the authorization is terminated or revoked.     Resp Syncytial Virus by PCR NEGATIVE NEGATIVE Final    Comment: (NOTE) Fact Sheet for Patients: BloggerCourse.com  Fact Sheet for Healthcare Providers: SeriousBroker.it  This test is not yet approved or cleared by the [place]  FDA and has been authorized for detection and/or diagnosis of SARS-CoV-2 by FDA under an Emergency Use Authorization (EUA). This EUA will remain in effect (meaning this test can be used) for the duration of the COVID-19 declaration under Section 564(b)(1) of the Act, 21 U.S.C. section 360bbb-3(b)(1), unless the authorization is terminated or revoked.  Performed at [facility], [address], [place], [place] [address]   Respiratory (~20  pathogens) panel by PCR     Status: Abnormal   Collection Time: 06/06/24 12:52 PM   Specimen: Nasopharyngeal Swab; Respiratory  Result Value Ref Range Status   Adenovirus NOT DETECTED NOT DETECTED Final   Coronavirus 229E NOT DETECTED NOT DETECTED Final    Comment: (NOTE) The Coronavirus on the Respiratory Panel, DOES NOT test for the novel  Coronavirus (2019 nCoV)    Coronavirus HKU1 NOT DETECTED NOT DETECTED Final   Coronavirus NL63 NOT DETECTED NOT DETECTED Final   Coronavirus OC43 NOT DETECTED NOT DETECTED Final   Metapneumovirus NOT DETECTED NOT DETECTED Final   Rhinovirus / Enterovirus DETECTED (A) NOT DETECTED Final   Influenza A NOT DETECTED NOT DETECTED Final   Influenza B NOT DETECTED NOT DETECTED Final   Parainfluenza Virus 1 NOT DETECTED NOT DETECTED Final   Parainfluenza Virus 2 NOT DETECTED NOT DETECTED Final   Parainfluenza Virus 3 NOT DETECTED NOT DETECTED Final   Parainfluenza Virus 4 NOT DETECTED NOT DETECTED Final   Respiratory Syncytial Virus NOT DETECTED NOT DETECTED Final   Bordetella pertussis NOT DETECTED NOT DETECTED Final   Bordetella Parapertussis NOT DETECTED NOT DETECTED Final   Chlamydophila pneumoniae NOT DETECTED NOT DETECTED Final   Mycoplasma pneumoniae NOT DETECTED NOT DETECTED Final    Comment: Performed at [facility], [address] [place], [place] [address]     Labs:  CBC: Recent Labs  Lab 06/06/24 1300 06/07/24 0648 06/08/24 0457  WBC 10.8* 6.5 7.4  HGB 13.2 12.1* 11.4*  HCT 42.4 37.8* 35.8*  MCV 97.5 95.5 96.5  PLT 166 142* 148*   BMP &GFR Recent Labs  Lab 06/06/24 1300 06/07/24 0648 06/08/24 0457 06/08/24 0851  NA 135 137  --  140  K 4.3 3.9  --  4.1  CL 98 100  --  100  CO2 24 25  --  26  GLUCOSE 102* 87  --  103*  BUN 28* 34*  --  35*  CREATININE 1.74* 1.75*  --  1.88*  CALCIUM  9.5 9.1  --  9.2  MG  --  1.8 1.7  --   PHOS  --  4.1  --  4.3   Estimated Creatinine Clearance: 31.2 mL/min (A) (by C-G formula  based on SCr of 1.88 mg/dL (H)). Liver & Pancreas: Recent Labs  Lab 06/08/24 0851  ALBUMIN  2.9*   No results for input(s): LIPASE, AMYLASE in the last 168 hours. No results for input(s):  AMMONIA in the last 168 hours. Diabetic: No results for input(s): HGBA1C in the last 72 hours. No results for input(s): GLUCAP in the last 168 hours. Cardiac Enzymes: No results for input(s): CKTOTAL, CKMB, CKMBINDEX, TROPONINI in the last 168 hours. No results for input(s): PROBNP in the last 8760 hours. Coagulation Profile: No results for input(s): INR, PROTIME in the last 168 hours. Thyroid  Function Tests: No results for input(s): TSH, T4TOTAL, FREET4, T3FREE, THYROIDAB in the last 72 hours. Lipid Profile: No results for input(s): CHOL, HDL, LDLCALC, TRIG, CHOLHDL, LDLDIRECT in the last 72 hours. Anemia Panel: No results for input(s): VITAMINB12, FOLATE, FERRITIN, TIBC, IRON, RETICCTPCT in the last 72 hours. Urine analysis:    Component Value Date/Time   COLORURINE YELLOW 08/04/2022 1349   APPEARANCEUR CLEAR 08/04/2022 1349   LABSPEC 1.010 08/04/2022 1349   PHURINE 5.0 08/04/2022 1349   GLUCOSEU NEGATIVE 08/04/2022 1349   HGBUR SMALL (A) 08/04/2022 1349   BILIRUBINUR NEGATIVE 08/04/2022 1349   KETONESUR NEGATIVE 08/04/2022 1349   PROTEINUR NEGATIVE 08/04/2022 1349   UROBILINOGEN 0.2 12/23/2010 2026   NITRITE NEGATIVE 08/04/2022 1349   LEUKOCYTESUR TRACE (A) 08/04/2022 1349   Sepsis Labs: Invalid input(s): PROCALCITONIN, LACTICIDVEN   SIGNED:  Dayanis Bergquist T Joleene Burnham, MD  [facility] 06/08/2024, 5:08 PM

## 2024-06-17 DIAGNOSIS — J189 Pneumonia, unspecified organism: Secondary | ICD-10-CM | POA: Diagnosis not present

## 2024-06-17 DIAGNOSIS — J9601 Acute respiratory failure with hypoxia: Secondary | ICD-10-CM | POA: Diagnosis not present

## 2024-06-17 DIAGNOSIS — I714 Abdominal aortic aneurysm, without rupture, unspecified: Secondary | ICD-10-CM | POA: Diagnosis not present

## 2024-06-17 DIAGNOSIS — I130 Hypertensive heart and chronic kidney disease with heart failure and stage 1 through stage 4 chronic kidney disease, or unspecified chronic kidney disease: Secondary | ICD-10-CM | POA: Diagnosis not present

## 2024-06-17 DIAGNOSIS — I5022 Chronic systolic (congestive) heart failure: Secondary | ICD-10-CM | POA: Diagnosis not present

## 2024-06-17 DIAGNOSIS — I480 Paroxysmal atrial fibrillation: Secondary | ICD-10-CM | POA: Diagnosis not present

## 2024-06-17 DIAGNOSIS — B348 Other viral infections of unspecified site: Secondary | ICD-10-CM | POA: Diagnosis not present

## 2024-06-17 DIAGNOSIS — I350 Nonrheumatic aortic (valve) stenosis: Secondary | ICD-10-CM | POA: Diagnosis not present

## 2024-06-17 DIAGNOSIS — J45909 Unspecified asthma, uncomplicated: Secondary | ICD-10-CM | POA: Diagnosis not present

## 2024-06-17 DIAGNOSIS — G4733 Obstructive sleep apnea (adult) (pediatric): Secondary | ICD-10-CM | POA: Diagnosis not present

## 2024-06-17 DIAGNOSIS — Z950 Presence of cardiac pacemaker: Secondary | ICD-10-CM | POA: Diagnosis not present

## 2024-06-17 DIAGNOSIS — K219 Gastro-esophageal reflux disease without esophagitis: Secondary | ICD-10-CM | POA: Diagnosis not present

## 2024-06-20 DIAGNOSIS — M5136 Other intervertebral disc degeneration, lumbar region with discogenic back pain only: Secondary | ICD-10-CM | POA: Diagnosis not present

## 2024-06-21 ENCOUNTER — Telehealth: Payer: Self-pay

## 2024-06-21 NOTE — Telephone Encounter (Signed)
 Clinical pharmacist to review Savaysa 

## 2024-06-21 NOTE — Telephone Encounter (Signed)
   Pre-operative Risk Assessment    Patient Name: Eric Gordon  DOB: 19-Jan-1931 MRN: [id number]   Date of last office visit: 10/05/23 OLE HOLTS, MD Date of next office visit: NONE   Request for Surgical Clearance    Procedure:  EPIDURAL STEROID INJECTION, LUMBAR (PROC)  Date of Surgery:  Clearance TBD                                Surgeon:  DR BONNER Socks Group or Practice Name:  [facility] Phone number:  [phone] Fax number:  [phone]   Type of Clearance Requested:   - Medical  - Pharmacy:  Hold edoxaban   3 DAYS BEFORE   Type of Anesthesia:  Not Indicated   Additional requests/questions:    Signed, Lucie DELENA Ku   06/21/2024, 11:29 AM

## 2024-06-23 DIAGNOSIS — I130 Hypertensive heart and chronic kidney disease with heart failure and stage 1 through stage 4 chronic kidney disease, or unspecified chronic kidney disease: Secondary | ICD-10-CM | POA: Diagnosis not present

## 2024-06-23 DIAGNOSIS — I5022 Chronic systolic (congestive) heart failure: Secondary | ICD-10-CM | POA: Diagnosis not present

## 2024-06-23 DIAGNOSIS — E785 Hyperlipidemia, unspecified: Secondary | ICD-10-CM | POA: Diagnosis not present

## 2024-06-23 DIAGNOSIS — E1122 Type 2 diabetes mellitus with diabetic chronic kidney disease: Secondary | ICD-10-CM | POA: Diagnosis not present

## 2024-06-23 DIAGNOSIS — E039 Hypothyroidism, unspecified: Secondary | ICD-10-CM | POA: Diagnosis not present

## 2024-06-23 DIAGNOSIS — Z125 Encounter for screening for malignant neoplasm of prostate: Secondary | ICD-10-CM | POA: Diagnosis not present

## 2024-06-23 DIAGNOSIS — M8580 Other specified disorders of bone density and structure, unspecified site: Secondary | ICD-10-CM | POA: Diagnosis not present

## 2024-06-23 DIAGNOSIS — N1831 Chronic kidney disease, stage 3a: Secondary | ICD-10-CM | POA: Diagnosis not present

## 2024-06-27 NOTE — Telephone Encounter (Signed)
 Patient with diagnosis of afib and PE (post CABG) on Savaysa  for anticoagulation.    Procedure: EPIDURAL STEROID INJECTION, LUMBAR (PROC)  Date of procedure: TBD   CHA2DS2-VASc Score = 5   This indicates a 7.2% annual risk of stroke. The patient's score is based upon: CHF History: 1 HTN History: 1 Diabetes History: 0 Stroke History: 0 Vascular Disease History: 1 Age Score: 2 Gender Score: 0      CrCl 35 ml/min Platelet count 148  Patient has not had an Afib/aflutter ablation in the last 3 months, DCCV within the last 4 weeks or a watchman implanted in the last 45 days   Per office protocol, patient can hold Savaysa  for 3 days prior to procedure.    **This guidance is not considered finalized until pre-operative APP has relayed final recommendations.**

## 2024-06-28 NOTE — Telephone Encounter (Signed)
 Harlene,   You saw this patient on 04/12/2024. Per office protocol, will you please comment on medical clearance for lumbar ESI?  Please route your response to P CV DIV Preop. I will communicate with requesting office once you have given recommendations.   Thank you!  Barnie Hila, NP

## 2024-06-28 NOTE — Telephone Encounter (Signed)
   Name: Eric Gordon  DOB: Oct 16, 1930  MRN: [id number]   Primary Cardiologist: None  Chart reviewed as part of pre-operative protocol coverage. Eric Gordon was last seen on 04/12/2024 by Harlene Gainer, FNP.  Per Harlene, He is at acceptable CV risk for procedure. RCRI score = 2 points (5% risk of major cardiac event)  Therefore, based on ACC/AHA guidelines, the patient would be an acceptable risk for the planned procedure without further cardiovascular testing.   Per Pharm D, patient has not had an Afib/aflutter ablation within the last 3 months, DCCV within the last 4 weeks, or Watchman in the last 45 days. Patient may hold Savaysa  for 3 days prior to procedure.    I will route this recommendation to the requesting party via Epic fax function and remove from pre-op  pool. Please call with questions.  Barnie Hila, NP 06/28/2024, 11:55 AM

## 2024-06-30 DIAGNOSIS — J45909 Unspecified asthma, uncomplicated: Secondary | ICD-10-CM | POA: Diagnosis not present

## 2024-06-30 DIAGNOSIS — I739 Peripheral vascular disease, unspecified: Secondary | ICD-10-CM | POA: Diagnosis not present

## 2024-06-30 DIAGNOSIS — I480 Paroxysmal atrial fibrillation: Secondary | ICD-10-CM | POA: Diagnosis not present

## 2024-06-30 DIAGNOSIS — I350 Nonrheumatic aortic (valve) stenosis: Secondary | ICD-10-CM | POA: Diagnosis not present

## 2024-06-30 DIAGNOSIS — I5189 Other ill-defined heart diseases: Secondary | ICD-10-CM | POA: Diagnosis not present

## 2024-06-30 DIAGNOSIS — I130 Hypertensive heart and chronic kidney disease with heart failure and stage 1 through stage 4 chronic kidney disease, or unspecified chronic kidney disease: Secondary | ICD-10-CM | POA: Diagnosis not present

## 2024-06-30 DIAGNOSIS — J189 Pneumonia, unspecified organism: Secondary | ICD-10-CM | POA: Diagnosis not present

## 2024-06-30 DIAGNOSIS — I714 Abdominal aortic aneurysm, without rupture, unspecified: Secondary | ICD-10-CM | POA: Diagnosis not present

## 2024-06-30 DIAGNOSIS — Z0000 Encounter for general adult medical examination without abnormal findings: Secondary | ICD-10-CM | POA: Diagnosis not present

## 2024-06-30 DIAGNOSIS — R82998 Other abnormal findings in urine: Secondary | ICD-10-CM | POA: Diagnosis not present

## 2024-06-30 DIAGNOSIS — Z1389 Encounter for screening for other disorder: Secondary | ICD-10-CM | POA: Diagnosis not present

## 2024-06-30 DIAGNOSIS — I10 Essential (primary) hypertension: Secondary | ICD-10-CM | POA: Diagnosis not present

## 2024-06-30 DIAGNOSIS — N1831 Chronic kidney disease, stage 3a: Secondary | ICD-10-CM | POA: Diagnosis not present

## 2024-06-30 DIAGNOSIS — I5022 Chronic systolic (congestive) heart failure: Secondary | ICD-10-CM | POA: Diagnosis not present

## 2024-06-30 DIAGNOSIS — Z1331 Encounter for screening for depression: Secondary | ICD-10-CM | POA: Diagnosis not present

## 2024-06-30 DIAGNOSIS — E1122 Type 2 diabetes mellitus with diabetic chronic kidney disease: Secondary | ICD-10-CM | POA: Diagnosis not present

## 2024-07-19 DIAGNOSIS — M5416 Radiculopathy, lumbar region: Secondary | ICD-10-CM | POA: Diagnosis not present

## 2024-07-21 ENCOUNTER — Ambulatory Visit (HOSPITAL_COMMUNITY): Payer: Self-pay | Admitting: Cardiology

## 2024-07-21 ENCOUNTER — Ambulatory Visit (HOSPITAL_COMMUNITY)
Admission: RE | Admit: 2024-07-21 | Discharge: 2024-07-21 | Disposition: A | Discharge status: Home / Self Care | Source: Ambulatory Visit | Attending: Cardiology | Admitting: Cardiology

## 2024-07-21 DIAGNOSIS — I6529 Occlusion and stenosis of unspecified carotid artery: Secondary | ICD-10-CM | POA: Diagnosis not present

## 2024-07-21 DIAGNOSIS — I480 Paroxysmal atrial fibrillation: Secondary | ICD-10-CM | POA: Diagnosis not present

## 2024-07-21 DIAGNOSIS — I252 Old myocardial infarction: Secondary | ICD-10-CM | POA: Insufficient documentation

## 2024-07-21 DIAGNOSIS — I130 Hypertensive heart and chronic kidney disease with heart failure and stage 1 through stage 4 chronic kidney disease, or unspecified chronic kidney disease: Secondary | ICD-10-CM | POA: Insufficient documentation

## 2024-07-21 DIAGNOSIS — E785 Hyperlipidemia, unspecified: Secondary | ICD-10-CM | POA: Diagnosis not present

## 2024-07-21 DIAGNOSIS — I452 Bifascicular block: Secondary | ICD-10-CM | POA: Diagnosis not present

## 2024-07-21 DIAGNOSIS — I495 Sick sinus syndrome: Secondary | ICD-10-CM | POA: Diagnosis not present

## 2024-07-21 DIAGNOSIS — Z951 Presence of aortocoronary bypass graft: Secondary | ICD-10-CM | POA: Diagnosis not present

## 2024-07-21 DIAGNOSIS — N183 Chronic kidney disease, stage 3 unspecified: Secondary | ICD-10-CM | POA: Insufficient documentation

## 2024-07-21 DIAGNOSIS — I350 Nonrheumatic aortic (valve) stenosis: Secondary | ICD-10-CM | POA: Insufficient documentation

## 2024-07-21 DIAGNOSIS — Z87891 Personal history of nicotine dependence: Secondary | ICD-10-CM | POA: Insufficient documentation

## 2024-07-21 DIAGNOSIS — I5022 Chronic systolic (congestive) heart failure: Secondary | ICD-10-CM | POA: Insufficient documentation

## 2024-07-21 DIAGNOSIS — Z7901 Long term (current) use of anticoagulants: Secondary | ICD-10-CM | POA: Insufficient documentation

## 2024-07-21 DIAGNOSIS — I253 Aneurysm of heart: Secondary | ICD-10-CM | POA: Insufficient documentation

## 2024-07-21 DIAGNOSIS — I714 Abdominal aortic aneurysm, without rupture, unspecified: Secondary | ICD-10-CM | POA: Diagnosis not present

## 2024-07-21 DIAGNOSIS — Z79899 Other long term (current) drug therapy: Secondary | ICD-10-CM | POA: Insufficient documentation

## 2024-07-21 DIAGNOSIS — I2510 Atherosclerotic heart disease of native coronary artery without angina pectoris: Secondary | ICD-10-CM | POA: Diagnosis not present

## 2024-07-21 DIAGNOSIS — Z95810 Presence of automatic (implantable) cardiac defibrillator: Secondary | ICD-10-CM | POA: Diagnosis not present

## 2024-07-21 DIAGNOSIS — I4589 Other specified conduction disorders: Secondary | ICD-10-CM | POA: Insufficient documentation

## 2024-07-21 DIAGNOSIS — J90 Pleural effusion, not elsewhere classified: Secondary | ICD-10-CM | POA: Diagnosis not present

## 2024-07-21 LAB — CBC
HCT: 40.6 % (ref 39.0–52.0)
Hemoglobin: 12.6 g/dL — ABNORMAL LOW (ref 13.0–17.0)
MCH: 30.3 pg (ref 26.0–34.0)
MCHC: 31 g/dL (ref 30.0–36.0)
MCV: 97.6 fL (ref 80.0–100.0)
Platelets: 167 K/uL (ref 150–400)
RBC: 4.16 MIL/uL — ABNORMAL LOW (ref 4.22–5.81)
RDW: 14.6 % (ref 11.5–15.5)
WBC: 9.1 K/uL (ref 4.0–10.5)
nRBC: 0 % (ref 0.0–0.2)

## 2024-07-21 LAB — BASIC METABOLIC PANEL WITH GFR
Anion gap: 9 (ref 5–15)
BUN: 43 mg/dL — ABNORMAL HIGH (ref 8–23)
CO2: 28 mmol/L (ref 22–32)
Calcium: 9.3 mg/dL (ref 8.9–10.3)
Chloride: 103 mmol/L (ref 98–111)
Creatinine, Ser: 1.82 mg/dL — ABNORMAL HIGH (ref 0.61–1.24)
GFR, Estimated: 35 mL/min — ABNORMAL LOW (ref >60)
Glucose, Bld: 102 mg/dL — ABNORMAL HIGH (ref 70–99)
Potassium: 4.3 mmol/L (ref 3.5–5.1)
Sodium: 140 mmol/L (ref 135–145)

## 2024-07-21 LAB — BRAIN NATRIURETIC PEPTIDE: B Natriuretic Peptide: 500.1 pg/mL — ABNORMAL HIGH (ref 0.0–100.0)

## 2024-07-21 NOTE — Patient Instructions (Signed)
 There has been no changes to your medications.  Labs done today, your results will be available in MyChart, we will contact you for abnormal readings.  Your physician recommends that you schedule a follow-up appointment in: 4 months.  If you have any questions or concerns before your next appointment please send us  a message through [place] or call our office at [phone].    TO LEAVE A MESSAGE FOR THE NURSE SELECT OPTION 2, PLEASE LEAVE A MESSAGE INCLUDING: YOUR NAME DATE OF BIRTH CALL BACK NUMBER REASON FOR CALL**this is important as we prioritize the call backs  YOU WILL RECEIVE A CALL BACK THE SAME DAY AS LONG AS YOU CALL BEFORE 4:00 PM  At the [facility], you and your health needs are our priority. As part of our continuing mission to provide you with exceptional heart care, we have created designated Provider Care Teams. These Care Teams include your primary Cardiologist (physician) and Advanced Practice Providers (APPs- Physician Assistants and Nurse Practitioners) who all work together to provide you with the care you need, when you need it.   You may see any of the following providers on your designated Care Team at your next follow up: Dr Toribio Fuel Dr Ezra Shuck Dr. Morene Brownie Greig Mosses, NP Caffie Shed, [place] [facility] [place], [place] Beckey Coe, NP Jordan Lee, NP Ellouise Class, NP Tinnie Redman, PharmD Jaun Bash, PharmD   Please be sure to bring in all your medications bottles to every appointment.    Thank you for choosing [facility]

## 2024-07-21 NOTE — Progress Notes (Signed)
 ReDS Vest / Clip - 07/21/24 1500       ReDS Vest / Clip   Station Marker D    Ruler Value 35    ReDS Value Range Low volume    ReDS Actual Value 23

## 2024-07-21 NOTE — Progress Notes (Signed)
 Advanced Heart Failure Clinic Note  Date:  07/21/2024   ID:  Eric Gordon, DOB [birth year], MRN [id number]   Provider location: [facility] Type of Visit: Established patient  PCP:  [name], MD  EP: [name] Cardiologist:  [name]   HPI: Eric Gordon is a 89 y.o. male with history of CAD s/p CABG.  He has had trouble long-term with exertional dyspnea.  Dyspnea triggered evaluation in 2012 leading to CABG but was not resolved by CABG.  PFTs in 3/14 showed only mild obstructive airways disease and V/Q scan showed no PE.  Echo in 9/14 showed normal LV systolic function with moderately dilated RV. RHC in 2/15 showed normal right and left heart filling pressures and normal PA pressure.  Finally, he had a CPX in 3/15 that showed normal capacity compared to age-matched sedentary norms.  He was noted to have chronotropic incompetence.  At a prior appointment, I took him off metoprolol  given chronotropic incompetence noted on CPX.  Dyspnea improved significantly with weight loss.  He had Cardiolite in 8/18 with EF 52%, no ischemia or infarction.    He was admitted in 1/20 with NSTEMI.  LHC showed new occlusion of the branch of SVG-ramus and OM that touched down on OM.  There were also serial 70% stenoses in the mid/distal RCA.  There was not thought to be an interventional option and patient was treated medically. He was discharged home but presented a couple days later with recurrent chest pain and dyspnea.  Cath was repeated showing no change from prior.  This admission, he had FFR of the RCA which did not suggest hemodynamic significance.  Echo showed EF 55-60%, normal RV.  CTA did not show a PE. He was noted to be volume overloaded and was diuresed.  He was also noted to be in atrial fibrillation with RVR transiently.  ASA/Plavix  was stopped and Eliquis  was started. He was discharged to SNF.  Atrial fibrillation recurred and he was started on amiodarone   with conversion back to NSR.   In 4/20, he was admitted with left spontaneous PTX requiring chest tube.  After this, he developed recurrent left pleural effusions requiring thoracentesis x 3 so far.  Pleural fluid was exudative with eosinophil predominance.  With elevated ESR, there was concern that amiodarone  could be involved, so this medication was stopped.  CT chest in 6/20 showed LLL consolidation/collapse with small left effusion, there was a rim-enhancing lesion posterior to the heart between esophagus and left atrium, ?loculated fluid.  TEE was done in 7/20 and confirmed loculated pleural effusion behind the left atrium.  In 7/20, patient had VATS on the left.   Carotid dopplers in 9/21 showed 80-99% RICA stenosis, 60-79% LICA.  AAA US  in 9/21 showed 4.5 cm AAA. Patient was referred to Dr. Serene for evaluation => he has had TCAR on right in 12/21.  He had left TCAR in 5/22.   Cardiolite in 12/21 with EF 41%, no ischemia, fixed inferolateral defect. Echo in 1/22 showed EF 40-45%, basal to mid inferolateral AK, basal inferior HK, normal RV, mild aortic stenosis mean gradient 11 mmHg.  He was unable to tolerate dapagliflozin  and is off it. He was lightheaded on low dose Entresto  so this was stopped.   Zio monitor in 1/23 showed 25% atrial flutter.  He was referred to EP, by the time he saw EP, burden of atrial arrhythmias was lower.   Cardiolite in 3/23 showed prior inferolateral  and inferior infarct, no ischemia.   Patient was admitted in 4/23 with atypical chest pain.  This likely was due to cough/bronchitis.  CTA chest showed inferolateral wall pseudoaneurysm, this is chronic.  Echo in 4/23 showed EF 40-45%, akinesis of the inferolateral wall, basal inferolateral pseudoaneurysm, moderate RV enlargement, normal RV systolic function, mild AS with mean gradient 10 mmHg.   Zio monitor in 5/23 showed 2% AF burden, improved from prior.     Follow up 5/23, stable NYHA II, euvolemic. Off several  meds due to orthostatic symptoms.  Admitted 6/23 with syncope, no evidence for ACS. 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 EP study with inducible VT. Underwent dual chamber Biotronik ICD placement. Discharged home, weight 208 lbs.  VT again in 9/23, treated with ATP.   Follow up 10/23, NYHA III and volume stable. GDMT limited by orthostatic symptoms and need for midodrine . Mexiletine started for recurrent VT.  S/p aortobi-iliac endograft repair of infrarenal abdominal aortic aneurysm 12/23. Type 2 endoleak noted on CTA in 1/24.   Patient was admitted in 1/24 with weakness, dyspnea, cough, atypical chest pain.  CTA chest showed not PE.  He ended up having cardiac cath, this showed no significant change to his coronary anatomy from the past.  Filling pressures were not elevated on RHC.  He was discharged home.   Echo in 2/24 showed EF 45-50%, basal-mid inferior/inferolateral akinesis, low normal RV function, moderate AS with mean gradient 11 and AVA 1.13 cm^2.   Admitted 3/24 with syncopal events, one event resulting in trauma to head and back. One event coincide with VT. Neuro consulted, suggested conservative non-op treatment, back brace for 6-8 weeks. Discharged on amiodarone  taper and off mexiletine.  Patient again tolerated amiodarone  poorly with increased dyspnea (happened last time he was on amiodarone ).  This was then stopped and mexiletine was started.   Recurrent atrial fibrillation in 11/24, s/p DCCV to NSR.   Echo 5/25 EF 45% with inferolateral akinesis and anterolateral hypokinesis, mild LV dilation, mild LVH, trileaflet aortic valve with mild AI, moderate AS with mean gradient 16 mmHg and AVA 1.0 cm^2 (DI 0.29).   Today he returns for HF follow up. Breathing generally ok/stable. Uses a cane for balance. Short of breath walking long distances, unchanged.  +Bendopnea.  No orthopnea/PND.  No chest pain. 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  Goes to the [facility] 3 days/week. Weight is up 6 lbs.   ECG (personally reviewed): A-paced, RBBB, LAFB  REDS clip 23%  Labs (5/24): K 4.5, creatinine 1.89, urine immunofixation negative, myeloma panel negative Labs (6/24): K 5.0, creatinine 1.67, LDL 73 Labs (11/24): hgb 13.8, K 4.2, creatinine 1.98 Labs (4/25): K 4.5, creatinine 1.92, LDL 60 Labs (9/25): K 4.1, creatinine 1.88   PMH: 1. Essential tremor 2. CAD: s/p CABG in 3/12.   - Cardiolite (8/18): EF 52%, no ischemia/infarction.  - NSTEMI (1/20):  LHC showed new occlusion of the branch of SVG-ramus and OM that touched down on OM.  There were also serial 70% stenoses in the mid/distal RCA.  FFR of RCA was negative.  - Cardiolite (12/21): EF 41%, no ischemia, fixed inferolateral defect - Cardiolite (3/23): LV EF 37%, fixed basal-mid inferolateral/inferior defect, no ischemia.  - LHC (1/24): Totally occluded SVG-PDA, occlusion of the branch of sequential SVG-ramus and OM that touches down on OM but patent ramus branch, occluded SVG-D, patent LIMA-LAD. Similar to prior, no intervention.  3. Atrial fibrillation: Only noted  post-op CABG.  4. PE: Post-op CABG in 2012.  5. AAA: 3.6 cm on US  in 3/14.  3.6 cm on US  in 3/15. 3.6 cm on US  8/16. 4.1 cm on US  4/17.  - AAA US  (5/18): 4.1 cm AAA.  - AAA US  (5/19): 4.1 cm AAA.  - AAA US  (4/20): 4.1 cm AAA.  - AAA US  (9/21): 4.5 cm AAA - AAA US  (9/22): 4.5 cm AAA - AAA US  (10/23): 4.9 cm AAA - s/p EVAR 12/23, type 2 endoleak noted on 1/24 CTA.  6. OSA: On CPAP.  7. Carotid stenosis: Carotid dopplers (9/14) with 60-79% LICA stenosis.  Carotid dopplers (9/15) with 60-79% LICA stenosis. Carotid dopplers (3/16) with 60-79% RCIA stenosis, 40-59% LICA stenosis.  - Carotid dopplers (8/16) with 40-59% RICA stenosis, 60-79% LICA stenosis.  - Carotid dopplers (8/17) with 60-79% RICA, 40-59% LICA. - Carotid dopplers (9/18) with 40-59% RICA, 60-79% LICA.   - Carotid dopplers (9/19) with 40-59% RICA, 60-79%  LICA.  - Carotid dopplers (9/20) with 60-79% BICA - Carotid dopplers (9/21): 80-99% RICA, 60-79% LICA - Right TCAR 12/21, left TCAR 5/22.  - Carotid dopplers (4/23): patent ICA stents.  - Carotid dopplers (2/25): No in-stent restenosis.  8. Asthma 9. PNA x 2 10. Chronic diastolic CHF/dyspnea: Echo (9/14) with EF 60-65%, mild LVH, very mild AS with mean gradient 10 mmHg, RV moderately dilated.  PFTs (3/14) showed mild obstructive airways disease.  V/Q scan (3/14) with no PE.  ENT workup for upper respiratory causes was negative.  RHC (2/15) with mean RA 7, PA 32/12, mean PCWP 13, CI 3.18.  CPX (3/15) with peak VO2 16.4, VE/VCO2 33; normal when compared to age-matched sedentary normals; chronotropic incompetence was noted.  Dyspnea was improved with weight loss.  - Echo (8/17): EF 60-65%, mild AS.  - Echo (1/20): EF 55-60%, normal RV size and systolic function.  - Echo (6/20): EF 55%, mild LVH, mild AS, normal RV size and systolic function, ?LA mass or mass impinging on posterior LA.  - TEE (7/20): EF 50-55%, mild LVH, basal inferolateral aneurysm, normal RV size/systolic function, loculated pleural fluid behind LA.  - Echo (1/22): EF 40-45%, basal to mid inferolateral AK, basal inferior HK, normal RV, mild aortic stenosis mean gradient 11 mmHg. - Echo (2/23): EF 45-50%, moderate LVH, normal RV, mild AS mean gradient 15 mmHg with IVC normal.  - Echo (4/23): EF 40-45%, akinesis of the inferolateral wall, basal inferolateral pseudoaneurysm, moderate RV enlargement, normal RV systolic function, mild AS with mean gradient 10 mmHg.  - Echo (6/23): EF 40-45% and pre-existing aneurysm of the basal inferolateral wall, mild-mod AS. - RHC (1/24): mean RA 1, PA 19/10, mean PCWP 5, CI 2.66.  - Echo (2/24): EF 45-50%, basal-mid inferior/inferolateral akinesis, low normal RV function, moderate AS with mean gradient 11 and AVA 1.13 cm^2. - PYP scan (5/24): Grade 1, H/CL 1.2 => not suggestive of transthyretin  cardiac amyloidosis.  - Echo (4/25): EF 45% with inferolateral akinesis and anterolateral hypokinesis, mild LV dilation, mild LVH, trileaflet aortic valve with mild AI, moderate AS with mean gradient 16 mmHg and AVA 1.0 cm^2 (DI 0.29).  11. HTN 12. Aortic stenosis: Moderate on 4/25 echo.   13. Ascending aortic aneurysm: CTA chest with 4.4 cm ascending aorta in 1/20.  - 4.2 cm ascending aorta on CT chest 6/20.  14. Atrial fibrillation: Paroxysmal.  - DCCV 11/24 15. CKD: Stage 3.  16. Left lung spontaneous PTX then recurrent left pleural effusion.  -  Left VATS in 7/20.  17. Palpitations:  - Zio patch (6/20): NSR with 1 short NSVT run and few short SVT runs, no atrial fibrillation.  - Zio patch 2 wks (2/21): 6 short NSVT runs, 66 short SVT runs, rare PACs/PVCs.  - Zio patch (5/23): 2% AF burden, 4 NSVT runs longest 5 beats.  18. COVID-19 infection 4/22.  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  20. VT: Syncope in 6/23 with inducible VT. s/p dual chamber Biotronik ICD - Dyspnea with amiodarone  use.  21. Essential tremor  Current Outpatient Medications  Medication Sig Dispense Refill   atorvastatin  (LIPITOR ) 80 MG tablet Take 1 tablet (80 mg total) by mouth daily. 30 tablet 6   cetirizine (ZYRTEC) 10 MG tablet Take 10 mg by mouth daily.     Cholecalciferol  (VITAMIN D -3) 25 MCG (1000 UT) CAPS Take 1,000 Units by mouth at bedtime.     edoxaban  (SAVAYSA ) 30 MG TABS tablet Take 30 mg by mouth daily.     finasteride  (PROSCAR ) 5 MG tablet Take 5 mg by mouth at bedtime.     furosemide  (LASIX ) 20 MG tablet Take 2 tablets (40 mg total) by mouth daily. 60 tablet 3   levothyroxine  (SYNTHROID ) 50 MCG tablet Take 50 mcg by mouth in the morning.     metoprolol  succinate (TOPROL  XL) 25 MG 24 hr tablet Take 1 tablet (25 mg total) by mouth daily.     mexiletine (MEXITIL ) 150 MG capsule Take 150 mg by mouth 2 (two) times daily.     Multiple Vitamin (MULTIVITAMIN WITH MINERALS) TABS tablet Take 1  tablet by mouth in the morning.     pantoprazole  (PROTONIX ) 40 MG tablet Take 40 mg by mouth daily before breakfast.     primidone  (MYSOLINE ) 50 MG tablet Take 50 mg by mouth at bedtime.     spironolactone  (ALDACTONE ) 25 MG tablet Take 25 mg by mouth daily.     traMADol  (ULTRAM ) 50 MG tablet Take 50 mg by mouth daily as needed for moderate pain (pain score 4-6).     No current facility-administered medications for this encounter.   Allergies:   Ancef [cefazolin sodium], Jardiance [empagliflozin], and Vancomycin    Social History:  The patient  reports that he quit smoking about 40 years ago. His smoking use included cigarettes. He started smoking about 75 years ago. He has a 35 pack-year smoking history. He has never used smokeless tobacco. He reports current alcohol  use of about 2.0 standard drinks of alcohol  per week. He reports that he does not use drugs.   Family History:  The patient's family history includes Coronary artery disease in his mother; Heart disease in his mother; Hypertension in his mother.   ROS:  Please see the history of present illness.   All other systems are personally reviewed and negative.   BP 128/62   Pulse 98   Wt 92.2 kg (203 lb 3.2 oz)   SpO2 96%   BMI 25.40 kg/m   Wt Readings from Last 3 Encounters:  07/21/24 92.2 kg (203 lb 3.2 oz)  06/07/24 89.5 kg (197 lb 5 oz)  04/12/24 89.6 kg (197 lb 9.6 oz)    Physical Exam: General: NAD Neck: JVP 7-8 cm, no thyromegaly or thyroid  nodule.  Lungs: Clear to auscultation bilaterally with normal respiratory effort. CV: Nondisplaced PMI.  Heart regular S1/S2, no S3/S4, 2/6 SEM RUSB with clear S2.  1+ ankle edema.  No carotid bruit.  Normal pedal pulses.  Abdomen: Soft, nontender, no hepatosplenomegaly,  no distention.  Skin: Intact without lesions or rashes.  Neurologic: Alert and oriented x 3.  Psych: Normal affect. Extremities: No clubbing or cyanosis.  HEENT: Normal.   Assessment & Plan: 1. Syncope: 6/23,  most likely cardiogenic. Known tachy-brady syndrome with outpatient monitor showing post-conversion (AF => NSR) symptomatic pauses, up to 3 sec long. Has bifascicular block.  EP study in 6/23 w/ inducible VT.  - Biotronik ICD placed.  2. Chronic HF with mid range EF: TEE in 7/20 showed EF 50-55%, basal inferolateral aneurysm, and normal RV.  Echo in 1/22 with EF 40-45%, basal-mid inferolateral akinesis and inferior hypokinesis, normal RV.  Echo in 6/23 showed EF 40-45% and pre-existing aneurysm of the basal inferolateral wall, mild-mod AS.  RHC in 1/24 with normal filling pressures and preserved cardiac output.  Echo in 2/24 showed EF 45-50%, basal-mid inferior/inferolateral akinesis, low normal RV function, moderate AS with mean gradient 11 and AVA 1.13 cm^2.  Workup for cardiac amyloidosis was negative (PYP scan in 5/24 not suggestive of TTR cardiac amyloidosis).  Echo 5/25 EF 45% with inferolateral akinesis and anterolateral hypokinesis, mild LV dilation, mild LVH, trileaflet aortic valve with mild AI, moderate AS with mean gradient 16 mmHg and AVA 1.0 cm^2 (DI 0.29). NYHA class II-early III symptoms, not volume overloaded on exam.  Dyspnea improved since stopping amiodarone .  GDMT has been limited by orthostatic symptoms but this is actually improved. Suspect deconditioning and lung disease (recurrent left effusion and h/o PTX with likely scarring) play a role in his dyspnea.  NYHA class II-III, he is not volume overloaded by exam or REDS clip.  - Continue Toprol  XL 25 mg daily.  - Continue Lasix  40 mg daily + 10 KCL daily. BMET/BNP today.  - Continue spironolactone  12.5 mg qhs. - Off midodrine  and Imdur . - Off ARB due to orthostatic hypotension. - Unable to tolerate dapaglifozin. 3. CAD: s/p CABG 3/12. NSTEMI 1/20, LHC showed new occlusion of the branch of sequential SVG-ramus and OM that touched down on OM.  There were also serial 70% stenoses in the mid/distal RCA.  FFR of RCA was negative.  No  interventional target.  Cardiolite in 12/21 showed inferolateral infarction with no ischemia, consistent with findings on NSTEMI in 1/20. Cardiolite in 3/23 showed inferior/inferolateral fixed defect with no ischemia.  LHC in 1/24 showed unchanged coronary anatomy with occluded branch of sequential SVG-ramus and OM touching down on OM, patent LIMA-LAD, occluded SVG-D, occluded SVG-PDA; no intervention. No significant chest pain recently.  - No ASA given Eliquis  use.  - Continue atorvastatin  80 daily.  Lipids ok in 4/25.  - Continue Toprol  XL 25 mg daily.  - No longer on Imdur . 4. Hyperlipidemia: Continue statin, good lipids 4/25. 5. Spontaneous left PTX with recurrent left-sided pleural effusion:  He is s/p left VATS/pleurodesis in 7/20.    6. Carotid stenosis: S/p bilateral TCARs, followed at [facility].     7. AAA: 4.9 cm AAA in 10/23.  S/p EVAR 12/23.  Has type 2 endoleak by CTA in 1/24.  - Follow at [facility].  8. Aortic stenosis: Moderate by most recent echo, unchanged.   9. Atrial fibrillation: Paroxysmal. He is significantly symptomatic while in atrial fibrillation. He has gone back off amiodarone  given pulmonary symptoms again while on it.  Not candidate for sotalol per EP.  NSR today.  - Continue edoxaban  30 mg daily. CBC today.  10. 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  This has been stable.  11. VT:  Recurrent, suspect scar in the inferolateral wall is nidus.  Has Biotronik ICD.  He had VT again in 9/23, not associated with chest pain/ischemic symptoms.  VT noted with syncopal episode 11/27/22. Has not tolerated amiodarone  due to lung toxicity and mexiletine started.  - Continue mexiletine 150 mg bid.  - Continue Toprol  XL 25 mg daily.  12. CKD stage 3: BMET today.   Follow up in 4 months with APP  I spent 32 minutes reviewing records, interviewing/examining patient, and managing orders.    Ezra Shuck  07/21/2024

## 2024-08-22 ENCOUNTER — Telehealth: Payer: Self-pay

## 2024-08-22 NOTE — Telephone Encounter (Signed)
 Biotronik alert received for 1 VF event 08/21/24 ! 17:36 that was successfully terminated X1 burst of ATP.  Patient denies any symptoms, states he was eating dinner with family and overall feeling well. Denies any recent sickness. Reports compliance with medications on file including no missed doses. Patient is overdue for office visit. Patient advised no driving X6 months per [place] DMV.

## 2024-08-24 ENCOUNTER — Ambulatory Visit: Payer: Medicare Other

## 2024-08-24 DIAGNOSIS — I480 Paroxysmal atrial fibrillation: Secondary | ICD-10-CM | POA: Diagnosis not present

## 2024-08-24 LAB — CUP PACEART REMOTE DEVICE CHECK
Date Time Interrogation Session: 20251203073528
Implantable Lead Connection Status: 753985
Implantable Lead Connection Status: 753985
Implantable Lead Implant Date: 20230606
Implantable Lead Implant Date: 20230606
Implantable Lead Location: 753859
Implantable Lead Location: 753860
Implantable Lead Model: 377
Implantable Lead Model: 402266
Implantable Lead Serial Number: 8000807784
Implantable Lead Serial Number: 81503989
Implantable Pulse Generator Implant Date: 20230606
Pulse Gen Model: 429534
Pulse Gen Serial Number: 84900693

## 2024-08-25 NOTE — Telephone Encounter (Signed)
 Spoke w/ patient - patient is scheduled to see Dr. Cindie (sooner availability than WC/EP APP) on 12/8.

## 2024-08-28 NOTE — Progress Notes (Unsigned)
  Electrophysiology Office Follow up Visit Note:    Date:  08/29/2024   ID:  Eric Gordon, DOB 1931/08/08, MRN [id number]  PCP:  [name], MD  [facility] Cardiologist:  None  [facility] Electrophysiologist:  OLE ONEIDA HOLTS, MD    Interval History:     Eric Gordon is a 89 y.o. male who presents for a follow up visit.   I last saw the patient October 05, 2023.  The patient has a history of chronic systolic heart failure, ventricular tachycardia, ICD in situ, coronary artery disease, sleep apnea, coronary artery disease with CABG and atrial fibrillation.  He takes edoxaban  given a drug interaction between Eliquis  and primidone .  The patient had an episode of VT on November 30.  He was symptomatic while eating dinner with friends and family.  He did not fully lose consciousness.  He is still driving.      Past medical, surgical, social and family history were reviewed.  ROS:   Please see the history of present illness.    All other systems reviewed and are negative.  EKGs/Labs/Other Studies Reviewed:    The following studies were reviewed today:  August 29, 2024 in-clinic device interrogation personally reviewed Battery and lead parameter stable November 30 there was a ventricular tachycardia episode with a heart rate of 260 bpm that was appropriately and successfully treated by ATP.        Physical Exam:    VS:  BP 130/68 (BP Location: Left Arm, Patient Position: Sitting, Cuff Size: Large)   Pulse 70   Ht 6' 3 (1.905 m)   Wt 202 lb (91.6 kg)   SpO2 98%   BMI 25.25 kg/m     Wt Readings from Last 3 Encounters:  08/29/24 202 lb (91.6 kg)  07/21/24 203 lb 3.2 oz (92.2 kg)  06/07/24 197 lb 5 oz (89.5 kg)     GEN: no distress CARD: RRR, No MRG.  Generator pocket well-healed RESP: No IWOB. CTAB.      ASSESSMENT:    1. Chronic HFrEF (heart failure with reduced ejection fraction) (HCC)   2. VT (ventricular tachycardia) (HCC)   3. ICD  (implantable cardioverter-defibrillator) in place    PLAN:    In order of problems listed above:  #Ventricular tachycardia #ICD in situ Device functioning appropriately.  Continue remote monitoring Recent episode of VT successfully treated by his device/ATP.  I had a long discussion with the patient today about driving restrictions.  I discussed the importance of strict adherence to the driving restrictions for 6 months.  I provided him a printout of the ventricular tachycardia episode and ATP.  #Chronic systolic heart failure NYHA class II.  Warm and dry on exam. Continue spironolactone , metoprolol , Lasix   I discussed my upcoming departure from [facility] during today's clinic appointment.  The patient will continue to follow-up with one of my EP partners moving forward.  Follow-up 1 year with EP APP  Signed, Ole Holts, MD, [facility], [place] 08/29/2024 12:03 PM    Electrophysiology [facility]

## 2024-08-29 ENCOUNTER — Encounter: Payer: Self-pay | Admitting: Cardiology

## 2024-08-29 ENCOUNTER — Ambulatory Visit: Attending: Cardiology | Admitting: Cardiology

## 2024-08-29 VITALS — BP 130/68 | HR 70 | Ht 75.0 in | Wt 202.0 lb

## 2024-08-29 DIAGNOSIS — Z95810 Presence of automatic (implantable) cardiac defibrillator: Secondary | ICD-10-CM

## 2024-08-29 DIAGNOSIS — I5022 Chronic systolic (congestive) heart failure: Secondary | ICD-10-CM | POA: Diagnosis present

## 2024-08-29 DIAGNOSIS — I472 Ventricular tachycardia, unspecified: Secondary | ICD-10-CM

## 2024-08-29 LAB — CUP PACEART INCLINIC DEVICE CHECK
Battery Remaining Percentage: 100 %
Battery Voltage: 2.99 V
Brady Statistic AP VP Percent: 0 %
Brady Statistic AP VS Percent: 82 %
Brady Statistic AS VP Percent: 0 %
Brady Statistic AS VS Percent: 17 %
Brady Statistic RA Percent Paced: 82 %
Brady Statistic RV Percent Paced: 0 %
Date Time Interrogation Session: 20251208114341
HighPow Impedance: 87 Ohm
Implantable Lead Connection Status: 753985
Implantable Lead Connection Status: 753985
Implantable Lead Implant Date: 20230606
Implantable Lead Implant Date: 20230606
Implantable Lead Location: 753859
Implantable Lead Location: 753860
Implantable Lead Model: 377
Implantable Lead Model: 402266
Implantable Lead Serial Number: 8000807784
Implantable Lead Serial Number: 81503989
Implantable Pulse Generator Implant Date: 20230606
Lead Channel Impedance Value: 461 Ohm
Lead Channel Impedance Value: 598 Ohm
Lead Channel Pacing Threshold Amplitude: 0.8 V
Lead Channel Pacing Threshold Amplitude: 0.8 V
Lead Channel Pacing Threshold Pulse Width: 0.4 ms
Lead Channel Pacing Threshold Pulse Width: 0.4 ms
Lead Channel Setting Pacing Amplitude: 1.8 V
Lead Channel Setting Pacing Amplitude: 1.9 V
Lead Channel Setting Pacing Pulse Width: 0.4 ms
Lead Channel Setting Sensing Sensitivity: 0.8 mV
Pulse Gen Model: 429534
Pulse Gen Serial Number: 84900693
Zone Setting Status: 755011

## 2024-08-29 NOTE — Patient Instructions (Signed)

## 2024-08-31 NOTE — Progress Notes (Signed)
 Remote ICD Transmission

## 2024-09-24 ENCOUNTER — Ambulatory Visit: Payer: Self-pay | Admitting: Cardiology

## 2024-09-24 ENCOUNTER — Emergency Department (HOSPITAL_COMMUNITY)

## 2024-09-24 ENCOUNTER — Inpatient Hospital Stay (HOSPITAL_COMMUNITY): Admission: EM | Admit: 2024-09-24 | Discharge: 2024-09-27 | DRG: 309 | Disposition: A | Discharge status: Home Health

## 2024-09-24 ENCOUNTER — Encounter (HOSPITAL_COMMUNITY): Payer: Self-pay | Admitting: Internal Medicine

## 2024-09-24 DIAGNOSIS — Z8249 Family history of ischemic heart disease and other diseases of the circulatory system: Secondary | ICD-10-CM

## 2024-09-24 DIAGNOSIS — I350 Nonrheumatic aortic (valve) stenosis: Secondary | ICD-10-CM | POA: Diagnosis not present

## 2024-09-24 DIAGNOSIS — I5042 Chronic combined systolic (congestive) and diastolic (congestive) heart failure: Secondary | ICD-10-CM | POA: Diagnosis present

## 2024-09-24 DIAGNOSIS — I472 Ventricular tachycardia, unspecified: Secondary | ICD-10-CM | POA: Diagnosis present

## 2024-09-24 DIAGNOSIS — E785 Hyperlipidemia, unspecified: Secondary | ICD-10-CM | POA: Diagnosis present

## 2024-09-24 DIAGNOSIS — J45909 Unspecified asthma, uncomplicated: Secondary | ICD-10-CM | POA: Diagnosis present

## 2024-09-24 DIAGNOSIS — I252 Old myocardial infarction: Secondary | ICD-10-CM

## 2024-09-24 DIAGNOSIS — I493 Ventricular premature depolarization: Secondary | ICD-10-CM | POA: Diagnosis present

## 2024-09-24 DIAGNOSIS — S2241XA Multiple fractures of ribs, right side, initial encounter for closed fracture: Secondary | ICD-10-CM | POA: Diagnosis present

## 2024-09-24 DIAGNOSIS — M25561 Pain in right knee: Secondary | ICD-10-CM | POA: Diagnosis present

## 2024-09-24 DIAGNOSIS — I452 Bifascicular block: Secondary | ICD-10-CM | POA: Diagnosis present

## 2024-09-24 DIAGNOSIS — I130 Hypertensive heart and chronic kidney disease with heart failure and stage 1 through stage 4 chronic kidney disease, or unspecified chronic kidney disease: Secondary | ICD-10-CM | POA: Diagnosis present

## 2024-09-24 DIAGNOSIS — I714 Abdominal aortic aneurysm, without rupture, unspecified: Secondary | ICD-10-CM | POA: Diagnosis present

## 2024-09-24 DIAGNOSIS — S22009A Unspecified fracture of unspecified thoracic vertebra, initial encounter for closed fracture: Secondary | ICD-10-CM | POA: Diagnosis not present

## 2024-09-24 DIAGNOSIS — R7989 Other specified abnormal findings of blood chemistry: Secondary | ICD-10-CM | POA: Diagnosis not present

## 2024-09-24 DIAGNOSIS — I25810 Atherosclerosis of coronary artery bypass graft(s) without angina pectoris: Secondary | ICD-10-CM | POA: Diagnosis present

## 2024-09-24 DIAGNOSIS — G4733 Obstructive sleep apnea (adult) (pediatric): Secondary | ICD-10-CM | POA: Diagnosis present

## 2024-09-24 DIAGNOSIS — Y92012 Bathroom of single-family (private) house as the place of occurrence of the external cause: Secondary | ICD-10-CM

## 2024-09-24 DIAGNOSIS — M25521 Pain in right elbow: Secondary | ICD-10-CM | POA: Diagnosis present

## 2024-09-24 DIAGNOSIS — W182XXA Fall in (into) shower or empty bathtub, initial encounter: Secondary | ICD-10-CM | POA: Diagnosis present

## 2024-09-24 DIAGNOSIS — E7800 Pure hypercholesterolemia, unspecified: Secondary | ICD-10-CM | POA: Diagnosis present

## 2024-09-24 DIAGNOSIS — E782 Mixed hyperlipidemia: Secondary | ICD-10-CM | POA: Diagnosis not present

## 2024-09-24 DIAGNOSIS — Z881 Allergy status to other antibiotic agents status: Secondary | ICD-10-CM

## 2024-09-24 DIAGNOSIS — S2249XA Multiple fractures of ribs, unspecified side, initial encounter for closed fracture: Secondary | ICD-10-CM | POA: Diagnosis present

## 2024-09-24 DIAGNOSIS — N1832 Chronic kidney disease, stage 3b: Secondary | ICD-10-CM | POA: Diagnosis not present

## 2024-09-24 DIAGNOSIS — Z86711 Personal history of pulmonary embolism: Secondary | ICD-10-CM

## 2024-09-24 DIAGNOSIS — K219 Gastro-esophageal reflux disease without esophagitis: Secondary | ICD-10-CM | POA: Diagnosis present

## 2024-09-24 DIAGNOSIS — Z79899 Other long term (current) drug therapy: Secondary | ICD-10-CM

## 2024-09-24 DIAGNOSIS — D631 Anemia in chronic kidney disease: Secondary | ICD-10-CM | POA: Diagnosis present

## 2024-09-24 DIAGNOSIS — Z79890 Hormone replacement therapy (postmenopausal): Secondary | ICD-10-CM

## 2024-09-24 DIAGNOSIS — Z8679 Personal history of other diseases of the circulatory system: Secondary | ICD-10-CM

## 2024-09-24 DIAGNOSIS — Y93E1 Activity, personal bathing and showering: Secondary | ICD-10-CM

## 2024-09-24 DIAGNOSIS — I959 Hypotension, unspecified: Secondary | ICD-10-CM | POA: Diagnosis present

## 2024-09-24 DIAGNOSIS — E039 Hypothyroidism, unspecified: Secondary | ICD-10-CM | POA: Diagnosis present

## 2024-09-24 DIAGNOSIS — R55 Syncope and collapse: Secondary | ICD-10-CM | POA: Diagnosis not present

## 2024-09-24 DIAGNOSIS — I480 Paroxysmal atrial fibrillation: Secondary | ICD-10-CM | POA: Diagnosis present

## 2024-09-24 DIAGNOSIS — Z7901 Long term (current) use of anticoagulants: Secondary | ICD-10-CM

## 2024-09-24 DIAGNOSIS — I2510 Atherosclerotic heart disease of native coronary artery without angina pectoris: Secondary | ICD-10-CM | POA: Diagnosis present

## 2024-09-24 DIAGNOSIS — Z87891 Personal history of nicotine dependence: Secondary | ICD-10-CM

## 2024-09-24 DIAGNOSIS — M62838 Other muscle spasm: Secondary | ICD-10-CM | POA: Diagnosis not present

## 2024-09-24 DIAGNOSIS — Z4502 Encounter for adjustment and management of automatic implantable cardiac defibrillator: Secondary | ICD-10-CM

## 2024-09-24 DIAGNOSIS — I352 Nonrheumatic aortic (valve) stenosis with insufficiency: Secondary | ICD-10-CM | POA: Diagnosis present

## 2024-09-24 DIAGNOSIS — I4901 Ventricular fibrillation: Principal | ICD-10-CM | POA: Diagnosis present

## 2024-09-24 DIAGNOSIS — Z95810 Presence of automatic (implantable) cardiac defibrillator: Secondary | ICD-10-CM

## 2024-09-24 DIAGNOSIS — Z888 Allergy status to other drugs, medicaments and biological substances status: Secondary | ICD-10-CM

## 2024-09-24 DIAGNOSIS — S22068A Other fracture of T7-T8 thoracic vertebra, initial encounter for closed fracture: Secondary | ICD-10-CM | POA: Diagnosis present

## 2024-09-24 HISTORY — DX: Nonrheumatic aortic (valve) stenosis: I35.0

## 2024-09-24 LAB — COMPREHENSIVE METABOLIC PANEL WITH GFR
ALT: 12 U/L (ref 0–44)
AST: 26 U/L (ref 15–41)
Albumin: 3.8 g/dL (ref 3.5–5.0)
Alkaline Phosphatase: 114 U/L (ref 38–126)
Anion gap: 11 (ref 5–15)
BUN: 29 mg/dL — ABNORMAL HIGH (ref 8–23)
CO2: 27 mmol/L (ref 22–32)
Calcium: 9.7 mg/dL (ref 8.9–10.3)
Chloride: 100 mmol/L (ref 98–111)
Creatinine, Ser: 1.63 mg/dL — ABNORMAL HIGH (ref 0.61–1.24)
GFR, Estimated: 40 mL/min — ABNORMAL LOW
Glucose, Bld: 122 mg/dL — ABNORMAL HIGH (ref 70–99)
Potassium: 4.1 mmol/L (ref 3.5–5.1)
Sodium: 138 mmol/L (ref 135–145)
Total Bilirubin: 0.5 mg/dL (ref 0.0–1.2)
Total Protein: 7.2 g/dL (ref 6.5–8.1)

## 2024-09-24 LAB — ETHANOL: Alcohol, Ethyl (B): <15 mg/dL

## 2024-09-24 LAB — TYPE AND SCREEN
ABO/RH(D): A POS
Antibody Screen: NEGATIVE

## 2024-09-24 LAB — URINALYSIS, ROUTINE W REFLEX MICROSCOPIC
Bilirubin Urine: NEGATIVE
Glucose, UA: NEGATIVE mg/dL
Hgb urine dipstick: NEGATIVE
Ketones, ur: NEGATIVE mg/dL
Leukocytes,Ua: NEGATIVE
Nitrite: NEGATIVE
Protein, ur: NEGATIVE mg/dL
Specific Gravity, Urine: 1.012 (ref 1.005–1.030)
pH: 5 (ref 5.0–8.0)

## 2024-09-24 LAB — I-STAT CHEM 8, ED
BUN: 30 mg/dL — ABNORMAL HIGH (ref 8–23)
Calcium, Ion: 1.18 mmol/L (ref 1.15–1.40)
Chloride: 101 mmol/L (ref 98–111)
Creatinine, Ser: 1.7 mg/dL — ABNORMAL HIGH (ref 0.61–1.24)
Glucose, Bld: 119 mg/dL — ABNORMAL HIGH (ref 70–99)
HCT: 38 % — ABNORMAL LOW (ref 39.0–52.0)
Hemoglobin: 12.9 g/dL — ABNORMAL LOW (ref 13.0–17.0)
Potassium: 4 mmol/L (ref 3.5–5.1)
Sodium: 139 mmol/L (ref 135–145)
TCO2: 28 mmol/L (ref 22–32)

## 2024-09-24 LAB — MAGNESIUM: Magnesium: 1.7 mg/dL (ref 1.7–2.4)

## 2024-09-24 LAB — CBC WITH DIFFERENTIAL/PLATELET
Abs Immature Granulocytes: 0.06 K/uL (ref 0.00–0.07)
Basophils Absolute: 0 K/uL (ref 0.0–0.1)
Basophils Relative: 0 %
Eosinophils Absolute: 0.2 K/uL (ref 0.0–0.5)
Eosinophils Relative: 2 %
HCT: 40 % (ref 39.0–52.0)
Hemoglobin: 12.6 g/dL — ABNORMAL LOW (ref 13.0–17.0)
Immature Granulocytes: 1 %
Lymphocytes Relative: 11 %
Lymphs Abs: 1.2 K/uL (ref 0.7–4.0)
MCH: 30.3 pg (ref 26.0–34.0)
MCHC: 31.5 g/dL (ref 30.0–36.0)
MCV: 96.2 fL (ref 80.0–100.0)
Monocytes Absolute: 1.1 K/uL — ABNORMAL HIGH (ref 0.1–1.0)
Monocytes Relative: 10 %
Neutro Abs: 7.8 K/uL — ABNORMAL HIGH (ref 1.7–7.7)
Neutrophils Relative %: 76 %
Platelets: 153 K/uL (ref 150–400)
RBC: 4.16 MIL/uL — ABNORMAL LOW (ref 4.22–5.81)
RDW: 14.3 % (ref 11.5–15.5)
WBC: 10.3 K/uL (ref 4.0–10.5)
nRBC: 0 % (ref 0.0–0.2)

## 2024-09-24 LAB — I-STAT CG4 LACTIC ACID, ED: Lactic Acid, Venous: 1.5 mmol/L (ref 0.5–1.9)

## 2024-09-24 LAB — PROTIME-INR
INR: 1.3 — ABNORMAL HIGH (ref 0.8–1.2)
Prothrombin Time: 16.8 s — ABNORMAL HIGH (ref 11.4–15.2)

## 2024-09-24 LAB — TROPONIN T, HIGH SENSITIVITY
Troponin T High Sensitivity: 31 ng/L — ABNORMAL HIGH (ref 0–19)
Troponin T High Sensitivity: 31 ng/L — ABNORMAL HIGH (ref 0–19)

## 2024-09-24 LAB — PRO BRAIN NATRIURETIC PEPTIDE: Pro Brain Natriuretic Peptide: 2681 pg/mL — ABNORMAL HIGH

## 2024-09-24 MED ORDER — FENTANYL CITRATE (PF) 50 MCG/ML IJ SOSY
100.0000 ug | PREFILLED_SYRINGE | Freq: Once | INTRAMUSCULAR | Status: AC
  Administered 2024-09-24: 100 ug via INTRAVENOUS
  Filled 2024-09-24: qty 2

## 2024-09-24 MED ORDER — ATORVASTATIN CALCIUM 80 MG PO TABS
80.0000 mg | ORAL_TABLET | Freq: Every day | ORAL | Status: DC
  Administered 2024-09-24 – 2024-09-26 (×3): 80 mg via ORAL
  Filled 2024-09-24: qty 2
  Filled 2024-09-24 (×2): qty 1

## 2024-09-24 MED ORDER — NALOXONE HCL 0.4 MG/ML IJ SOLN: 0.4000 mg | INTRAMUSCULAR | Status: DC | PRN

## 2024-09-24 MED ORDER — METOPROLOL SUCCINATE ER 25 MG PO TB24
25.0000 mg | ORAL_TABLET | Freq: Every day | ORAL | Status: DC
  Administered 2024-09-24 – 2024-09-26 (×3): 25 mg via ORAL
  Filled 2024-09-24 (×3): qty 1

## 2024-09-24 MED ORDER — MEXILETINE HCL 150 MG PO CAPS
150.0000 mg | ORAL_CAPSULE | Freq: Two times a day (BID) | ORAL | Status: DC
  Administered 2024-09-24 – 2024-09-26 (×4): 150 mg via ORAL
  Filled 2024-09-24 (×5): qty 1

## 2024-09-24 MED ORDER — EDOXABAN TOSYLATE 30 MG PO TABS
30.0000 mg | ORAL_TABLET | ORAL | Status: DC
  Administered 2024-09-25 – 2024-09-27 (×3): 30 mg via ORAL
  Filled 2024-09-24 (×3): qty 1

## 2024-09-24 MED ORDER — ACETAMINOPHEN 325 MG PO TABS: 650.0000 mg | ORAL_TABLET | Freq: Four times a day (QID) | ORAL | Status: DC | PRN

## 2024-09-24 MED ORDER — MELATONIN 3 MG PO TABS
3.0000 mg | ORAL_TABLET | Freq: Every evening | ORAL | Status: DC | PRN
  Administered 2024-09-25 – 2024-09-26 (×2): 3 mg via ORAL
  Filled 2024-09-24 (×2): qty 1

## 2024-09-24 MED ORDER — LACTATED RINGERS IV BOLUS: 500.0000 mL | Freq: Once | INTRAVENOUS | Status: AC

## 2024-09-24 MED ORDER — SPIRONOLACTONE 12.5 MG HALF TABLET
12.5000 mg | ORAL_TABLET | Freq: Two times a day (BID) | ORAL | Status: DC
  Administered 2024-09-25 – 2024-09-27 (×5): 12.5 mg via ORAL
  Filled 2024-09-24 (×5): qty 1

## 2024-09-24 MED ORDER — MAGNESIUM SULFATE 2 GM/50ML IV SOLN
2.0000 g | Freq: Once | INTRAVENOUS | Status: AC
  Administered 2024-09-24: 2 g via INTRAVENOUS
  Filled 2024-09-24: qty 50

## 2024-09-24 MED ORDER — LIDOCAINE 5 % EX PTCH
1.0000 | MEDICATED_PATCH | CUTANEOUS | Status: DC
  Administered 2024-09-24 – 2024-09-27 (×4): 1 via TRANSDERMAL
  Filled 2024-09-24 (×4): qty 1

## 2024-09-24 MED ORDER — PANTOPRAZOLE SODIUM 40 MG PO TBEC
40.0000 mg | DELAYED_RELEASE_TABLET | Freq: Every day | ORAL | Status: DC
  Administered 2024-09-25 – 2024-09-27 (×3): 40 mg via ORAL
  Filled 2024-09-24 (×3): qty 1

## 2024-09-24 MED ORDER — METHOCARBAMOL 500 MG PO TABS
500.0000 mg | ORAL_TABLET | Freq: Once | ORAL | Status: AC
  Administered 2024-09-24: 500 mg via ORAL
  Filled 2024-09-24: qty 1

## 2024-09-24 MED ORDER — HYDROCODONE-ACETAMINOPHEN 5-325 MG PO TABS
1.0000 | ORAL_TABLET | ORAL | Status: DC | PRN
  Administered 2024-09-25 – 2024-09-27 (×9): 1 via ORAL
  Filled 2024-09-24 (×9): qty 1

## 2024-09-24 MED ORDER — ONDANSETRON HCL 4 MG/2ML IJ SOLN: 4.0000 mg | Freq: Four times a day (QID) | INTRAMUSCULAR | Status: DC | PRN

## 2024-09-24 MED ORDER — ACETAMINOPHEN 650 MG RE SUPP: 650.0000 mg | Freq: Four times a day (QID) | RECTAL | Status: DC | PRN

## 2024-09-24 MED ORDER — LEVOTHYROXINE SODIUM 75 MCG PO TABS
75.0000 ug | ORAL_TABLET | Freq: Every day | ORAL | Status: DC
  Administered 2024-09-25 – 2024-09-27 (×3): 75 ug via ORAL
  Filled 2024-09-24 (×3): qty 1

## 2024-09-24 MED ORDER — FUROSEMIDE 40 MG PO TABS
40.0000 mg | ORAL_TABLET | Freq: Every day | ORAL | Status: DC
  Administered 2024-09-25 – 2024-09-27 (×3): 40 mg via ORAL
  Filled 2024-09-24: qty 2
  Filled 2024-09-24 (×2): qty 1

## 2024-09-24 NOTE — ED Triage Notes (Signed)
 Pt BIB [facility] from home d/t a fall yesterday. Pt fell in the shower, was found by the wife. Landed on the right side. C/o R rib pain, R elbow and R knee. VSS. A&O X4.

## 2024-09-24 NOTE — H&P (Signed)
 " History and Physical      [name]:[id number] DOB: [birth year] DOA: 09/24/2024; DOS: 09/24/2024  PCP: [name], MD  Patient coming from: home   I have personally briefly reviewed patient's old medical records in [facility]  Chief Complaint: Right-sided chest pain  HPI: Eric Gordon is a 89 y.o. male with medical history significant for moderately severe aortic stenosis, paroxysmal atrial fibrillation chronically anticoagulated on Edoxaban , paroxysmal ventricular tachycardia status post AICD placement in June 2023, chronic systolic/diastolic heart failure, coronary disease status post CABG in 2012, essential tremor, CKD 3B with baseline creatinine 1.5-2.0, spontaneous pneumothorax in 2020 status post chest tube in April 2020, VATS/talc  pleurodesis in July 2020, who is admitted to [facility] on 09/24/2024 with single episode of syncope without prodrome after presenting from home to [facility] ED complaining of right-sided chest pain after syncopal episode.   The patient reports a single episode of syncope that occurred without prodrome while in the shower yesterday, on 09/23/24.  He notes that he was standing, showering, in his normal state of health, completely asymptomatic, and that he subsequently awoke on the floor of the shower, laying on his right side, having nearly smashed the shower seat that he was not using.  Denies any preceding associated or ensuing central or left-sided chest discomfort or any associated shortness of breath palpitations, diaphoresis do any preceding dizziness, lightheadedness or subjective sensation of impending loss of consciousness.   He has experienced no additional episodes of syncope following yesterday's isolated event.  However, he presents to the ED this evening complaining of right lateral chest wall discomfort that started with the fall associated with a syncopal episode yesterday.  Reports the discomfort is sharp in nature, limited to  the right/lateral right portion of the chest, worsening with deep inspiration.  No associated any hemoptysis or significant shortness of breath.  He denies any central or left-sided chest discomfort.  No exertional chest pain.  He also notes some mild right elbow and right knee pain that also started acutely at the time of yesterday's fall.   In the setting of a history of paroxysmal atrial fibrillation, he is chronically anticoagulated on Edoxaban .  He is noted to be on mexiletine as well as metoprolol  25 mg p.o. nightly as an outpatient.  He is unsure if he hit his head as component of this fall.  Denies any acute headache or neck discomfort.  Otherwise, no additional blood thinners as an outpatient.  His cardiac history is also notable for moderate aortic stenosis, paroxysmal ventricular tachycardia status post AICD placed in June 2023 by Dr. Cindie at [facility], chronic systolic/diastolic heart failure, for which she follows with [name] of [facility] cardiology, on Lasix  and spironolactone .  Additionally, he has a history of coronary artery disease status post CABG in 2012.  Most recent echocardiogram occurred in April 2025, although this was noted to be of suboptimal quality, resulting in the ureter and this echocardiogram being unable to evaluate left ventricular systolic or left ventricular diastolic function.  Otherwise, this echocardiogram from April 2025 with felt to demonstrate mild LVH, normal right ventricular systolic function, mild mitral digitation, moderate aortic regurgitation, moderate aortic stenosis with a aortic valve area of 1.01 cm per continuity equation using VTI.  Prior to that, the patient had undergone echocardiogram in February 2024 which was notable for LVEF 45 to 50%, grade 1 diastolic dysfunction.  Medical history also notable for acute pulmonary embolism in April 2022, which, per  chart review, was felt to be provoked as it occurred shortly after CABG.  He also has a  history of spontaneous pneumothorax in 2020 status post chest tube in April 2020 followed by VATS/talc  pleurodesis in July 2020.  He presents to the ED this evening requesting pain control as a relates to his right lateral chest wall discomfort.  Per chart review, no previous high-sensitivity troponin T values available.  Most recent high sensitive troponin I data point occurred in September 2025, when troponin I was found to be 18 x 2 occurrences.   ED Course:  Vital signs in the ED were notable for the following: Afebrile; heart rates in the 70s to 80s; systolic blood pressures in the 1 teens 130s; respiratory rate 20-26, oxygen  saturation 94 to 100% on room air.  Labs were notable for the following: CMP was notable for the following: Sodium 130, potassium 4.1, bicarbonate 27, creatinine 1.63 compared to 1.82 on 07/21/2024, liver enzymes within normal limits.  Magnesium  level 1.7.  High sensitive troponin T was 31 x 2 occurrences, in the absence of any previously available troponin T data points.  Lactic acid 1.5.  CBC notable for white cell count 10,300, hemoglobin 12.6 unchanged from most recent prior hemoglobin value on 07/21/2024, platelet count 153.  INR 1.3.  Type and screen was completed in ED.  Urinalysis showed white blood cells with leukocyte esterase/nitrate negative and showed no evidence of hemoglobin.  Per my interpretation, EKG in ED demonstrated the following: In comparison to most recent prior EKG performed on 07/21/2024, today's EKG shows atrial fibrillation with heart rate 82, right bundle branch block, left anterior fascicular block, 2 inversions in V1 and V2, which appear unchanged measures prior EKG and appear consistent with his right bundle branch block, less than 1 mm ST depression in leads I, V1, V2, V3, with less than 1 mm ST depression in V1, V2, V3.  Unchanged most recent right EKG and also consistent with his right bundle branch block, with demonstrate no evidence of ST  changes.  Imaging in the ED, per corresponding formal radiology read, was notable for the following: 1 view chest x-ray showed no evidence of acute cardiopulmonary process.  Plain films of the pelvis showed no evidence of acute bony abnormality.  Plain films of the right elbow show no evidence of acute traumatic injury.  Plain films of the right knee showed moderate degenerative joint disease medially, but no evidence of acute traumatic injury.  CT head showed no evidence of acute intracranial process, including evidence of intracranial Atriance of acute infarct or CT cervical spine showed no evidence of acute cervical spine fracture or subluxation injury.  CT chest, abdomen, pelvis showed nondisplaced fractures of the right 8th and 9th ribs, without any associated pneumothorax, also demonstrating nondisplaced fracture of the right T8 transverse process, subcutaneous soft tissue contusion along the inferior lateral right chest wall and right flank, without any evidence of hemothorax, small left-sided pleural effusion, no pleural effusion on the left, will also showing no evidence of pulmonary infiltrate, or edema.  CT abdomen/pelvis showed no evidence of acute traumatic intra-abdominal or intrapelvic injury.  While in the ED, the following were administered: Fentanyl  100 mcg IV x 2 doses, lidocaine  patch, Robaxin  500 mg p.o. x 1 dose, lactated Ringer 's 500 cc bolus.  Subsequently, the patient was admitted for further evaluation management of single episode of syncope without prodrome, as well as pain control for acute right-sided nondisplaced fractures of the right 8th and 9th  ribs and acute nondisplaced fracture of right T8 transverse process.     Review of Systems: As per HPI otherwise 10 point review of systems negative.   Past Medical History:  Diagnosis Date   Abdominal aortic aneurysm (AAA)    Achilles tendinitis of right lower extremity    AICD (automatic cardioverter/defibrillator) present     Arthritis    my whole body (03/19/2018)   Atherosclerosis of coronary artery bypass graft w/o angina pectoris    Atrial fibrillation (HCC)    Barrett's esophagus    CAD (coronary artery disease)    Carotid artery disease    CHF (congestive heart failure) (HCC)    Chronic anticoagulation    PE   Chronic lower back pain    Dyspnea    Dysrhythmia    Essential tremor    GERD (gastroesophageal reflux disease)    High cholesterol    Hyperlipidemia    Hyperplasia, prostate    Hypertension    Hypothyroidism    Myocardial infarction (HCC)    2 in Jan. 2019   Nail dystrophy    OSA on CPAP    uses CPAP   Osteoarthrosis    Pneumonia    now and once before (03/19/2018)   Pulmonary embolism [facility])    April 2012 after CABG   S/P CABG (coronary artery bypass graft)    Sleep apnea    Spontaneous pneumothorax    left spontaneous pneumothorax/left pleural effusion, s/p chest tube 01/09/19; thoracentesis x2, s/p left VATS/tacl pleurodesis 04/12/19    Past Surgical History:  Procedure Laterality Date   ABDOMINAL AORTIC ENDOVASCULAR STENT GRAFT N/A 08/06/2022   Procedure: ABDOMINAL AORTIC ENDOVASCULAR STENT GRAFT;  Surgeon: Serene Gaile ORN, MD;  Location: [facility] OR;  Service: Vascular;  Laterality: N/A;   ACHILLES TENDON REPAIR Bilateral    2006 & 2004   CARDIAC CATHETERIZATION  11/2010   CARDIOVERSION N/A 08/03/2023   Procedure: CARDIOVERSION (CATH LAB);  Surgeon: Rolan Ezra RAMAN, MD;  Location: [facility] INVASIVE CV LAB;  Service: Cardiovascular;  Laterality: N/A;   CORONARY ANGIOGRAPHY N/A 10/22/2018   Procedure: CORONARY ANGIOGRAPHY;  Surgeon: Burnard Debby LABOR, MD;  Location: [facility] INVASIVE CV LAB;  Service: Cardiovascular;  Laterality: N/A;   CORONARY ARTERY BYPASS GRAFT  March 2012   CABG X 5   CORONARY PRESSURE/FFR STUDY N/A 10/22/2018   Procedure: INTRAVASCULAR PRESSURE WIRE/FFR STUDY;  Surgeon: Burnard Debby LABOR, MD;  Location: [facility] INVASIVE CV LAB;  Service: Cardiovascular;  Laterality: N/A;    ESOPHAGOGASTRODUODENOSCOPY (EGD) WITH PROPOFOL  N/A 12/10/2020   Procedure: ESOPHAGOGASTRODUODENOSCOPY (EGD) WITH PROPOFOL ;  Surgeon: Legrand Victory LITTIE DOUGLAS, MD;  Location: [facility] ENDOSCOPY;  Service: Gastroenterology;  Laterality: N/A;   ICD IMPLANT N/A 02/25/2022   Procedure: ICD IMPLANT;  Surgeon: Cindie Ole DASEN, MD;  Location: [facility] INVASIVE CV LAB;  Service: Cardiovascular;  Laterality: N/A;   INGUINAL HERNIA REPAIR Left 1980   IR THORACENTESIS ASP PLEURAL SPACE W/IMG GUIDE  02/04/2019   IR THORACENTESIS ASP PLEURAL SPACE W/IMG GUIDE  02/24/2019   KNEE ARTHROSCOPY Left 2006   LEFT HEART CATH AND CORS/GRAFTS ANGIOGRAPHY N/A 10/18/2018   Procedure: LEFT HEART CATH AND CORS/GRAFTS ANGIOGRAPHY;  Surgeon: Court Dorn PARAS, MD;  Location: [facility] INVASIVE CV LAB;  Service: Cardiovascular;  Laterality: N/A;   LEFT HEART CATH AND CORS/GRAFTS ANGIOGRAPHY N/A 10/21/2018   Procedure: LEFT HEART CATH AND CORS/GRAFTS ANGIOGRAPHY;  Surgeon: Burnard Debby LABOR, MD;  Location: [facility] INVASIVE CV LAB;  Service: Cardiovascular;  Laterality: N/A;   PACEMAKER  IMPLANT N/A 02/24/2022   Procedure: PACEMAKER IMPLANT;  Surgeon: Fernande Elspeth BROCKS, MD;  Location: [facility] INVASIVE CV LAB;  Service: Cardiovascular;  Laterality: N/A;   PENILE PROSTHESIS IMPLANT     PLEURAL BIOPSY Left 04/12/2019   Procedure: Pleural Biopsy;  Surgeon: Army Dallas NOVAK, MD;  Location: [facility] OR;  Service: Thoracic;  Laterality: Left;   PLEURAL EFFUSION DRAINAGE Left 04/12/2019   Procedure: Drainage Of Pleural Effusion;  Surgeon: Army Dallas NOVAK, MD;  Location: [facility] OR;  Service: Thoracic;  Laterality: Left;   RIGHT HEART CATHETERIZATION N/A 11/14/2013   Procedure: RIGHT HEART CATH;  Surgeon: Ezra GORMAN Shuck, MD;  Location: [facility] CATH LAB;  Service: Cardiovascular;  Laterality: N/A;   RIGHT/LEFT HEART CATH AND CORONARY/GRAFT ANGIOGRAPHY N/A 10/15/2022   Procedure: RIGHT/LEFT HEART CATH AND CORONARY/GRAFT ANGIOGRAPHY;  Surgeon: Verlin Lonni BIRCH, MD;  Location: [facility] INVASIVE CV  LAB;  Service: Cardiovascular;  Laterality: N/A;   SHOULDER OPEN ROTATOR CUFF REPAIR Right 2003   TALC  PLEURODESIS Left 04/12/2019   Procedure: TALC  PLEURADESIS;  Surgeon: Army Dallas NOVAK, MD;  Location: [facility] OR;  Service: Thoracic;  Laterality: Left;   TEE WITHOUT CARDIOVERSION N/A 03/24/2019   Procedure: TRANSESOPHAGEAL ECHOCARDIOGRAM (TEE);  Surgeon: Shuck Ezra GORMAN, MD;  Location: [facility] ENDOSCOPY;  Service: Cardiovascular;  Laterality: N/A;   TEE WITHOUT CARDIOVERSION  04/12/2019   Procedure: Transesophageal Echocardiogram (Tee);  Surgeon: Army Dallas NOVAK, MD;  Location: [facility] OR;  Service: Thoracic;;   THORACOSCOPY Left 04/12/2019   VIDEO BRONCHOSCOPY (N/A ) VIDEO ASSISTED THORACOSCOPY (Left Chest) TALC  PLEURADESIS (Left    TRANSCAROTID ARTERY REVASCULARIZATION  Right 09/13/2020   Procedure: RIGHT TRANSCAROTID ARTERY REVASCULARIZATION USING 9mm X 40mm ENROUTE TRANSCAROTID STEND SYSTEM;  Surgeon: Serene Gaile ORN, MD;  Location: [facility] OR;  Service: Vascular;  Laterality: Right;   TRANSCAROTID ARTERY REVASCULARIZATION  Left 02/08/2021   Procedure: LEFT TRANSCAROTID ARTERY REVASCULARIZATION;  Surgeon: Serene Gaile ORN, MD;  Location: [facility] OR;  Service: Vascular;  Laterality: Left;   ULTRASOUND GUIDANCE FOR VASCULAR ACCESS  09/13/2020   Procedure: ULTRASOUND GUIDANCE FOR VASCULAR ACCESS;  Surgeon: Serene Gaile ORN, MD;  Location: [facility] OR;  Service: Vascular;;   ULTRASOUND GUIDANCE FOR VASCULAR ACCESS Bilateral 08/06/2022   Procedure: ULTRASOUND GUIDANCE FOR VASCULAR ACCESS;  Surgeon: Serene Gaile ORN, MD;  Location: [facility] OR;  Service: Vascular;  Laterality: Bilateral;   VIDEO ASSISTED THORACOSCOPY Left 04/12/2019   Procedure: VIDEO ASSISTED THORACOSCOPY;  Surgeon: Army Dallas NOVAK, MD;  Location: [facility] OR;  Service: Thoracic;  Laterality: Left;   VIDEO BRONCHOSCOPY N/A 04/12/2019   Procedure: VIDEO BRONCHOSCOPY;  Surgeon: Army Dallas NOVAK, MD;  Location: [facility] OR;  Service: Thoracic;  Laterality: N/A;     Social History:  reports that he quit smoking about 40 years ago. His smoking use included cigarettes. He started smoking about 75 years ago. He has a 35 pack-year smoking history. He has never used smokeless tobacco. He reports current alcohol  use of about 2.0 standard drinks of alcohol  per week. He reports that he does not use drugs.   Allergies[1]  Family History  Problem Relation Age of Onset   Coronary artery disease Mother    Hypertension Mother    Heart disease Mother      Prior to Admission medications  Medication Sig Start Date End Date Taking? Authorizing Provider  albuterol  (VENTOLIN  HFA) 108 (90 Base) MCG/ACT inhaler Inhale 2 puffs into the lungs every 6 (six) hours as needed for wheezing or shortness of breath. 07/29/24   [provider]  atorvastatin  (LIPITOR ) 80 MG tablet Take 1 tablet (80 mg total) by mouth daily. 11/01/18   Rolan Ezra RAMAN, MD  cetirizine (ZYRTEC) 10 MG tablet Take 10 mg by mouth daily.    [provider]  Cholecalciferol  (VITAMIN D -3) 25 MCG (1000 UT) CAPS Take 1,000 Units by mouth at bedtime.    [provider]  edoxaban  (SAVAYSA ) 30 MG TABS tablet Take 30 mg by mouth daily. 08/26/23   [provider]  finasteride  (PROSCAR ) 5 MG tablet Take 5 mg by mouth at bedtime. 08/20/22   [provider]  furosemide  (LASIX ) 20 MG tablet Take 2 tablets (40 mg total) by mouth daily. 05/26/23   Milford, Harlene HERO, FNP  levothyroxine  (SYNTHROID ) 50 MCG tablet Take 50 mcg by mouth in the morning.    [provider]  metoprolol  succinate (TOPROL  XL) 25 MG 24 hr tablet Take 1 tablet (25 mg total) by mouth daily. 11/28/22   Lesia Ozell Barter, PA-C  mexiletine (MEXITIL ) 150 MG capsule Take 150 mg by mouth 2 (two) times daily.    [provider]  Multiple Vitamin (MULTIVITAMIN WITH MINERALS) TABS tablet Take 1 tablet by mouth in the morning.    [provider]  pantoprazole  (PROTONIX ) 40 MG tablet  Take 40 mg by mouth daily before breakfast.    [provider]  primidone  (MYSOLINE ) 50 MG tablet Take 50 mg by mouth at bedtime.    [provider]  spironolactone  (ALDACTONE ) 25 MG tablet Take 25 mg by mouth daily.    [provider]  traMADol  (ULTRAM ) 50 MG tablet Take 50 mg by mouth daily as needed for moderate pain (pain score 4-6).    [provider]     Objective    Physical Exam: Vitals:   09/24/24 1615 09/24/24 1815 09/24/24 1830 09/24/24 1915  BP: 117/62 (!) 123/48 122/60   Pulse: 81 89 90   Resp: (!) 25 (!) 30 (!) 26   Temp:    (!) 97.5 F (36.4 C)  TempSrc:    Oral  SpO2: 94% 93% 94%     General: appears to be stated age; alert Skin: warm, dry, no rash Head:  AT/[place] Mouth:  Oral mucosa membranes appear moist, normal dentition Neck: supple; trachea midline Heart: Irregular, but rate controlled; did not appreciate any M/R/G Chest: No evidence of paradoxical motion Lungs: CTAB, did not appreciate any wheezes, rales, or rhonchi Abdomen: + BS; soft, ND, NT Vascular: 2+ pedal pulses b/l; 2+ radial pulses b/l Extremities: no peripheral edema, no muscle wasting       Labs on Admission: I have personally reviewed following labs and imaging studies  CBC: Recent Labs  Lab 09/24/24 1518 09/24/24 1529  WBC 10.3  --   NEUTROABS 7.8*  --   HGB 12.6* 12.9*  HCT 40.0 38.0*  MCV 96.2  --   PLT 153  --    Basic Metabolic Panel: Recent Labs  Lab 09/24/24 1518 09/24/24 1529  NA 138 139  K 4.1 4.0  CL 100 101  CO2 27  --   GLUCOSE 122* 119*  BUN 29* 30*  CREATININE 1.63* 1.70*  CALCIUM  9.7  --   MG 1.7  --    GFR: CrCl cannot be calculated (Unknown ideal weight.). Liver Function Tests: Recent Labs  Lab 09/24/24 1518  AST 26  ALT 12  ALKPHOS 114  BILITOT 0.5  PROT 7.2  ALBUMIN  3.8   No results for input(s): LIPASE,  AMYLASE in the last 168 hours. No results for input(s): AMMONIA in the last 168  hours. Coagulation Profile: Recent Labs  Lab 09/24/24 1518  INR 1.3*   Cardiac Enzymes: No results for input(s): CKTOTAL, CKMB, CKMBINDEX, TROPONINI in the last 168 hours. BNP (last 3 results) No results for input(s): PROBNP in the last 8760 hours. HbA1C: No results for input(s): HGBA1C in the last 72 hours. CBG: No results for input(s): GLUCAP in the last 168 hours. Lipid Profile: No results for input(s): CHOL, HDL, LDLCALC, TRIG, CHOLHDL, LDLDIRECT in the last 72 hours. Thyroid  Function Tests: No results for input(s): TSH, T4TOTAL, FREET4, T3FREE, THYROIDAB in the last 72 hours. Anemia Panel: No results for input(s): VITAMINB12, FOLATE, FERRITIN, TIBC, IRON, RETICCTPCT in the last 72 hours. Urine analysis:    Component Value Date/Time   COLORURINE YELLOW 09/24/2024 1800   APPEARANCEUR HAZY (A) 09/24/2024 1800   LABSPEC 1.012 09/24/2024 1800   PHURINE 5.0 09/24/2024 1800   GLUCOSEU NEGATIVE 09/24/2024 1800   HGBUR NEGATIVE 09/24/2024 1800   BILIRUBINUR NEGATIVE 09/24/2024 1800   KETONESUR NEGATIVE 09/24/2024 1800   PROTEINUR NEGATIVE 09/24/2024 1800   UROBILINOGEN 0.2 12/23/2010 2026   NITRITE NEGATIVE 09/24/2024 1800   LEUKOCYTESUR NEGATIVE 09/24/2024 1800    Radiological Exams on Admission: CT CHEST ABDOMEN PELVIS WO CONTRAST Result Date: 09/24/2024 CLINICAL DATA:  Unwitnessed fall with right rib pain. EXAM: CT CHEST, ABDOMEN AND PELVIS WITHOUT CONTRAST TECHNIQUE: Multidetector CT imaging of the chest, abdomen and pelvis was performed following the standard protocol without IV contrast. RADIATION DOSE REDUCTION: This exam was performed according to the departmental dose-optimization program which includes automated exposure control, adjustment of the mA and/or kV according to patient size and/or use of iterative reconstruction technique. COMPARISON:  CTA chest, abdomen, and pelvis dated 06/06/2024 FINDINGS: CT CHEST FINDINGS  Cardiovascular: Left chest wall ICD leads terminate in the right atrium and ventricle. Normal heart size. No significant pericardial fluid/thickening. Dilated ascending thoracic aortic aneurysm measures 4.0 cm. Coronary artery calcifications. Mitral annular calcification. Mediastinum/Nodes: Imaged thyroid  gland without nodules meeting criteria for imaging follow-up by size. Small hiatal hernia. No pathologically enlarged axillary, supraclavicular, mediastinal, or hilar lymph nodes. Lungs/Pleura: The central airways are patent. Bilateral lower lobe subsegmental atelectasis. Biapical pleural-parenchymal scarring and calcified plaque. 3 mm subpleural right middle lobe nodule (5:103), unchanged. No pneumothorax. Unchanged small left pleural effusion. Musculoskeletal: Subtle cortical angulation of the lateral right fifth, sixth, and seventh ribs. Nondisplaced fractures of the anterolateral right eighth and ninth ribs. Additional nondisplaced fracture of the posterolateral and posterior eighth rib at the costovertebral junction and posteromedial ninth rib. Nondisplaced fracture of the right T8 transverse process. Degenerative changes of the shoulders. Median sternotomy wires are nondisplaced. Subcutaneous soft tissue reticulation along the inferolateral right chest wall and right flank. Multilevel degenerative changes of the thoracic spine. Unchanged mild vertebral body height loss of T9. CT ABDOMEN PELVIS FINDINGS Hepatobiliary: Scattered subcentimeter hypodensities, too small to characterize but likely cysts. Slightly prominent appearance of the caudate lobe lying medial to the IVC. No intra or extrahepatic biliary ductal dilation. Normal gallbladder. Pancreas: No focal lesions or main ductal dilation. Spleen: Normal in size without focal abnormality. Adrenals/Urinary Tract: No adrenal nodules. No suspicious renal lesions by noncontrast technique. Bilateral simple cysts, unchanged. No specific follow-up imaging  recommended. No hydronephrosis or calculi. Trabeculated bladder contour with multiple small diverticula. Stomach/Bowel: Normal appearance of the stomach. 1.4 x 1.2 cm lipoma in the duodenal bulb (3:63). No evidence of bowel wall thickening, distention,  or inflammatory changes. Colonic diverticulosis without acute diverticulitis. Normal appendix extends into the right inguinal hernia. Vascular/Lymphatic: Aortic atherosclerosis. Infrarenal abdominal aortic aneurysm status post repair with aorto bi-iliac stent graft. Excluded aneurysm sac measures 5.0 x 4.9 cm, unchanged. No enlarged abdominal or pelvic lymph nodes. Reproductive: Prostate is unremarkable. Penile prosthesis. Deflated reservoir in the midline anterior pelvis. Other: No free fluid, fluid collection, or free air. Musculoskeletal: No acute or abnormal lytic or blastic osseous findings. Small fat-containing bilateral inguinal hernias. Multilevel degenerative changes of the lumbar spine. IMPRESSION: 1. Nondisplaced fractures of the right eighth and ninth ribs, as described. 2. Nondisplaced fracture of the right T8 transverse process. 3. Subtle cortical angulation of the lateral right fifth, sixth, and seventh ribs, which may represent nondisplaced fractures. 4. Subcutaneous soft tissue contusion along the inferolateral right chest wall and right flank. 5. Unchanged small left pleural effusion. 6. No acute traumatic injury in the abdomen or pelvis. 7.  Aortic Atherosclerosis (ICD10-I70.0). Electronically Signed   By: Limin  Xu M.D.   On: 09/24/2024 18:00   CT CERVICAL SPINE WO CONTRAST Result Date: 09/24/2024 EXAM: CT CERVICAL SPINE WITHOUT CONTRAST 09/24/2024 05:14:41 PM TECHNIQUE: CT of the cervical spine was performed without the administration of intravenous contrast. Multiplanar reformatted images are provided for review. Automated exposure control, iterative reconstruction, and/or weight based adjustment of the mA/kV was utilized to reduce the  radiation dose to as low as reasonably achievable. COMPARISON: CT of the cervical spine 12/01/2022. CLINICAL HISTORY: Polytrauma, blunt. Fall in shower yesterday. FINDINGS: BONES AND ALIGNMENT: No acute fracture or traumatic malalignment. DEGENERATIVE CHANGES: Slight degenerative anterolisthesis at C4-C5 is stable. Uncovertebral and facet spurring contribute to moderate foraminal narrowing bilaterally at C3-C4 and C4-C5. Moderate left foraminal stenosis is present at C5-C6 and C6-C7. SOFT TISSUES: No prevertebral soft tissue swelling. Atherosclerotic changes are present at the aortic arch and great vessel origins. Bilateral carotid bifurcation stents are patent. IMPRESSION: 1. No acute cervical spine fracture or dislocation. Electronically signed by: Lonni Necessary MD 09/24/2024 05:28 PM EST RP Workstation: [id number]   CT HEAD WO CONTRAST Result Date: 09/24/2024 EXAM: CT HEAD WITHOUT CONTRAST 09/24/2024 05:14:41 PM TECHNIQUE: CT of the head was performed without the administration of intravenous contrast. Automated exposure control, iterative reconstruction, and/or weight based adjustment of the mA/kV was utilized to reduce the radiation dose to as low as reasonably achievable. COMPARISON: CT head without contrast 03/23/2023. CLINICAL HISTORY: Head trauma, moderate-severe. f aorta all in shower. FINDINGS: BRAIN AND VENTRICLES: No acute hemorrhage. No evidence of acute infarct. No hydrocephalus. No extra-axial collection. No mass effect or midline shift. Age-related atrophy. ORBITS: No acute abnormality. SINUSES: Partial opacification of the lower left mastoid air cells. Mucosal thickening of the right maxillary sinus. SOFT TISSUES AND SKULL: No acute soft tissue abnormality. No skull fracture. Atherosclerosis of skull base vasculature without hyperdense vessel or abnormal calcification. IMPRESSION: 1. No acute intracranial abnormality or evidence of acute trauma. Electronically signed by: Lonni Necessary  MD 09/24/2024 05:25 PM EST RP Workstation: [id number]   DG Elbow Complete Right Result Date: 09/24/2024 CLINICAL DATA:  Blunt trauma EXAM: RIGHT ELBOW - COMPLETE 3+ VIEW COMPARISON:  None Available. FINDINGS: There is no evidence of fracture, dislocation, or joint effusion. There is no evidence of arthropathy or other focal bone abnormality. Soft tissues are unremarkable. IMPRESSION: Negative. Electronically Signed   By: Lynwood Landy Raddle M.D.   On: 09/24/2024 16:06   DG Chest Port 1 View Result Date: 09/24/2024 CLINICAL DATA:  Blunt trauma EXAM:  PORTABLE CHEST 1 VIEW COMPARISON:  June 06, 2024 FINDINGS: Stable cardiomediastinal silhouette. Left-sided defibrillator is noted. Status post coronary bypass graft. Lungs are clear. Degenerative changes seen involving the right glenohumeral joint. IMPRESSION: No active disease. Electronically Signed   By: Lynwood Landy Raddle M.D.   On: 09/24/2024 16:04   DG Pelvis Portable Result Date: 09/24/2024 CLINICAL DATA:  Blunt trauma EXAM: PORTABLE PELVIS 1-2 VIEWS COMPARISON:  None Available. FINDINGS: There is no evidence of pelvic fracture or diastasis. No pelvic bone lesions are seen. IMPRESSION: Negative. Electronically Signed   By: Lynwood Landy Raddle M.D.   On: 09/24/2024 16:01   DG Knee Right Port Result Date: 09/24/2024 CLINICAL DATA:  Blunt trauma EXAM: PORTABLE RIGHT KNEE - 1-2 VIEW COMPARISON:  None Available. FINDINGS: No evidence of fracture, dislocation, or joint effusion. Moderate narrowing of medial joint space is noted. Soft tissues are unremarkable. IMPRESSION: Moderate degenerative joint disease is noted medially. No acute abnormality seen. Electronically Signed   By: Lynwood Landy Raddle M.D.   On: 09/24/2024 16:00      Assessment/Plan   Principal Problem:   Syncope Active Problems:   Chronic combined systolic and diastolic heart failure (HCC)   CKD stage 3b, GFR 30-44 ml/min (HCC)   Acquired hypothyroidism   HLD (hyperlipidemia)   Paroxysmal atrial  fibrillation (HCC)   Moderate aortic stenosis   Paroxysmal ventricular tachycardia (HCC)   Multiple rib fractures   Elevated troponin   Fracture of thoracic transverse process (HCC)      #) Syncope: Single episode of syncope without prodrome that occurred while in the shower yesterday (09/23/24).  With the episode of syncope occurring while showering, there would be an increased risk for vasovagal influences.  However, the patient denies any prodromal features, rendering this possibility to be less likely.  Rather, differential/additional considerations include potential ventricular arrhythmia, noting that he does have a documented history of paroxysmal ventricular tachycardia status post ICD, with additional differential that includes contribution from moderate to severe aortic stenosis.  ACS appears less likely at this time, in the absence of any associated chest pain, We will high-sensitivity troponin T is minimally elevated and flat, which is reassuring given that his episode of syncope occurred greater than 24 hours ago now, while EKG shows known history of rate controlled atrial fibrillation without overt evidence of acute ischemic changes, including no evidence of STEMI.  The context of his chronic anticoagulation Edoxaban , acute pulmonary embolism appears less likely.  No evidence of pneumothorax on imaging.  Will take advantage of the presence of his ICD, and pursue interrogation of ICD to determine patient's cardiac rhythm at the time of his syncopal episode.   Of note, magnesium  level was 1.7.  Will provide supplementation to optimize this magnesium  level with goal magnesium  level of greater than or equal to 2.0.  Presenting CMP reflects potassium of 4.1.  Plan: I have placed nursing communication order requesting that ICD be interrogated, as above.  Monitor on symmetry.  Repeat troponin in the morning.  Echocardiogram in the morning.  Magnesium  2 g IV over 2 hours.  Repeat magnesium  level  as well as CMP, CBC in the morning.  Blood pressures ordered.  Orthostatic vital signs x 1 set.                     #) Acute nondisplaced fractures of right 8th and 9th ribs: Confirmed per CT imaging today in setting from his ground-level fall in the shower yesterday  in which he struck the right portion of his chest.  Fractures.  Nondisplaced, without any evidence of pneumothorax, nor any evidence radiographically of hemothorax.  No evidence of acute respiratory distress at this time, with the patient maintaining oxygen  saturations in the mid 90s to 100% on room air.  Will strive for optimization of pain control to decrease ensuing likelihood of development of pneumonia.   Plan: Continue lidocaine  patch initiated in the ED.  As needed Norco.  Assess from x-ray.  Continuous pulse oximetry.  Check VBG in the morning to evaluate for any evidence of suboptimal ventilation.  Check serum phosphorus level.                      #) Nondisplaced fracture of the right T8 transverse process: Confirmed on CT imaging today and suspected to be a consequence of yesterday's fall in the shower resulting from single syncopal episode.  Will pursue conservative measures, as below.  Plan: Lidocaine  patch.  Prn Norco.  Assess for hematuria.  Fall precautions.  I have placed orders for PT/OT consults ordered in the morning.  Further evaluation management of presenting single syncopal episode, as above.                     #) Moderate aortic stenosis:-History of such, of borderline severe classification, with most recent echocardiogram in April 2025 showing aortic valve area of 1.01 cm calculated via continuity equation using VTI.  Notable history given his presenting episode of syncope without prodrome.  In this context, we will pursue updated echocardiogram and be cognizant of increased likelihood for ensuing development of preload/afterload dependent  pathophysiology.  Plan: Echocardiogram in the morning, as above.  Monitor strict I's and O's and daily weights.                  #) Chronic systolic/diastolic heart failure: Documented history of such, without clinical or radiographic evidence to suggest acutely decompensated heart failure at this time, although he is at increased risk for ensuing development of acute volume overload given his history of moderate aortic regurgitation as well as moderate aortic stenosis.  Outpatient diuretic regimen includes Lasix , spironolactone .  In terms of goal-directed therapy, he is noted to be on metoprolol  succinate.  Not on any ACE inhibitor or ARB in the setting of his chronic kidney disease.  Most recent echocardiogram occurred in April 2025, but was noted to be of suboptimal quality preventing meaningful evaluation of left ventricular systolic/diastolic functions.  Prior to that, the patient had undergone echocardiogram in February 2024 which was notable for LVEF 45 to 50%, grade 1 diastolic dysfunction.  Plan: Monitor strict I's and O's and daily weights.  Add on proBNP and repeat proBNP in the morning.  Follow-up result of updated echocardiogram, ordered for the morning presenting syncopal episode without prodrome.  Resume home metoprolol  succinate, Lasix , spironolactone .                       #) Paroxysmal atrial fibrillation: Documented history of such. In setting of CHA2DS2-VASc score of  5, there is an indication for chronic anticoagulation for thromboembolic prophylaxis. Consistent with this, patient is chronically anticoagulated on  Edoxaban . Will resume his home doac at this time, with ensuing close attention to workup for his presenting single episode of syncope without prodrome, as above .  As noted above, traumatic workup performed in the ED today showed no evidence of intracranial hemorrhage or any  evidence of hematoma . Home AV nodal blocking regimen: Metoprolol   succinate 25 mg p.o. nightly.  Most recent echocardiogram occurred in April 2025, with results as above. Presenting EKG demonstrates rate controlled atrial fibrillation without overt evidence of acute ischemic changes.  He is also noted to be on mexiletine as an outpatient.  Plan: monitor strict I's & O's and daily weights. CMP/CBC in AM. Check serum mag level. Continue home AV nodal blocking regimen.  Continue home mexiletine and Edoxaban .  Monitor on telemetry.  Supplementation of serum magnesium  level, as above.                       #) CKD Stage 3B: Documented history of such, with baseline creatinine 1.5-2.0, with presenting creatinine consistent with this baseline.   Plan: Monitor strict I's and O's and daily weights.  Attempt to avoid nephrotoxic agents.  CMP/magnesium  level in the AM.                          #) Hyperlipidemia: documented h/o such. On high intensity atorvastatin  as outpatient.   Plan: continue home statin.                          #) GERD: documented h/o such; on Protonix  as outpatient.   Plan: continue home PPI.                         #) acquired hypothyroidism: documented h/o such, on Synthroid  as outpatient.   Plan: cont home Synthroid .      DVT prophylaxis: SCD's + home Edoxaban .  Code Status: Full code Family Communication: none Disposition Plan: Per Rounding Team Consults called: none;  Admission status: Observation     I SPENT GREATER THAN 75  MINUTES IN CLINICAL CARE TIME/MEDICAL DECISION-MAKING IN COMPLETING THIS ADMISSION.      Eva NOVAK Audreana Hancox DO [facility]  From 7PM - 7AM   09/24/2024, 7:44 PM        [1]  Allergies Allergen Reactions   Ancef [Cefazolin Sodium] Hives   Jardiance [Empagliflozin] Other (See Comments)    Dizziness   Vancomycin  Rash   "

## 2024-09-24 NOTE — ED Provider Notes (Signed)
 " [place] [facility] Provider Note   CSN: [id number] Arrival date & time: 09/24/24  1435     Patient presents with: Eric Gordon is a 89 y.o. male.   HPI Patient presents after fall.  Medical history includes HTN, asthma, OSA, CAD, atrial fibrillation, CHF, AAA, arthritis.  He is prescribed edoxaban .  He has recently had decreased p.o. intake.  Other than that, he was in his normal state of health yesterday.  Yesterday evening, he went to take a shower.  While in the shower, he had a syncopal episode.  He denies any prodrome.  He had a fall with a syncope and struck the left side of his rib cage on a shower chair.  He woke up on bathtub for.  He has since had ongoing pain and right ribs, right elbow, right knee.  He has not tried to ambulate since his fall.  He was able to stand and pivot with EMS.  They did note that he became quite dizzy and lightheaded with standing.  EMS noted some blood pressures but otherwise normal vital signs.  CBG was normal.  SpO2 is normal on room air.  Patient did take tramadol  earlier today.  He has not taken anything else for analgesia.    Prior to Admission medications  Medication Sig Start Date End Date Taking? Authorizing Provider  albuterol  (VENTOLIN  HFA) 108 (90 Base) MCG/ACT inhaler Inhale 2 puffs into the lungs every 6 (six) hours as needed for wheezing or shortness of breath. 07/29/24   [provider]  atorvastatin  (LIPITOR ) 80 MG tablet Take 1 tablet (80 mg total) by mouth daily. 11/01/18   Rolan Ezra RAMAN, MD  cetirizine (ZYRTEC) 10 MG tablet Take 10 mg by mouth daily.    [provider]  Cholecalciferol  (VITAMIN D -3) 25 MCG (1000 UT) CAPS Take 1,000 Units by mouth at bedtime.    [provider]  edoxaban  (SAVAYSA ) 30 MG TABS tablet Take 30 mg by mouth daily. 08/26/23   [provider]  finasteride  (PROSCAR ) 5 MG tablet Take 5 mg by mouth at bedtime. 08/20/22    [provider]  furosemide  (LASIX ) 20 MG tablet Take 2 tablets (40 mg total) by mouth daily. 05/26/23   [place], Harlene HERO, FNP  levothyroxine  (SYNTHROID ) 50 MCG tablet Take 50 mcg by mouth in the morning.    [provider]  metoprolol  succinate (TOPROL  XL) 25 MG 24 hr tablet Take 1 tablet (25 mg total) by mouth daily. 11/28/22   Lesia Ozell Barter, PA-C  mexiletine (MEXITIL ) 150 MG capsule Take 150 mg by mouth 2 (two) times daily.    [provider]  Multiple Vitamin (MULTIVITAMIN WITH MINERALS) TABS tablet Take 1 tablet by mouth in the morning.    [provider]  pantoprazole  (PROTONIX ) 40 MG tablet Take 40 mg by mouth daily before breakfast.    [provider]  primidone  (MYSOLINE ) 50 MG tablet Take 50 mg by mouth at bedtime.    [provider]  spironolactone  (ALDACTONE ) 25 MG tablet Take 25 mg by mouth daily.    [provider]  traMADol  (ULTRAM ) 50 MG tablet Take 50 mg by mouth daily as needed for moderate pain (pain score 4-6).    [provider]    Allergies: Ancef [cefazolin sodium], Jardiance [empagliflozin], and Vancomycin     Review of Systems  Cardiovascular:  Positive for chest pain.  Musculoskeletal:  Positive for arthralgias.  Neurological:  Positive for dizziness and syncope.  All other systems reviewed and are negative.   Updated Vital Signs BP 122/60   Pulse 90   Temp (!) 97.5 F (36.4 C) (Oral)   Resp (!) 26   SpO2 94%   Physical Exam Vitals and nursing note reviewed.  Constitutional:      General: He is not in acute distress.    Appearance: Normal appearance. He is well-developed. He is not ill-appearing, toxic-appearing or diaphoretic.  HENT:     Head: Normocephalic and atraumatic.     Right Ear: External ear normal.     Left Ear: External ear normal.     Nose: Nose normal.     Mouth/Throat:     Mouth: Mucous membranes are moist.  Eyes:     Extraocular Movements: Extraocular  movements intact.     Conjunctiva/sclera: Conjunctivae normal.  Cardiovascular:     Rate and Rhythm: Normal rate and regular rhythm.     Heart sounds: No murmur heard. Pulmonary:     Effort: Pulmonary effort is normal. No respiratory distress.     Breath sounds: Normal breath sounds. No wheezing or rales.  Chest:     Chest wall: Tenderness present.  Abdominal:     General: There is no distension.     Palpations: Abdomen is soft.     Tenderness: There is no abdominal tenderness.  Musculoskeletal:        General: Swelling and tenderness present. No deformity.     Cervical back: Normal range of motion and neck supple.  Skin:    General: Skin is warm and dry.     Coloration: Skin is not jaundiced or pale.  Neurological:     General: No focal deficit present.     Mental Status: He is alert and oriented to person, place, and time.     Cranial Nerves: No cranial nerve deficit.     Sensory: No sensory deficit.     Motor: No weakness.     Coordination: Coordination normal.  Psychiatric:        Mood and Affect: Mood normal.        Behavior: Behavior normal.     (all labs ordered are listed, but only abnormal results are displayed) Labs Reviewed  COMPREHENSIVE METABOLIC PANEL WITH GFR - Abnormal; Notable for the following components:      Result Value   Glucose, Bld 122 (*)    BUN 29 (*)    Creatinine, Ser 1.63 (*)    GFR, Estimated 40 (*)    All other components within normal limits  CBC WITH DIFFERENTIAL/PLATELET - Abnormal; Notable for the following components:   RBC 4.16 (*)    Hemoglobin 12.6 (*)    Neutro Abs 7.8 (*)    Monocytes Absolute 1.1 (*)    All other components within normal limits  PROTIME-INR - Abnormal; Notable for the following components:   Prothrombin Time 16.8 (*)    INR 1.3 (*)    All other components within normal limits  I-STAT CHEM 8, ED - Abnormal; Notable for the following components:   BUN 30 (*)    Creatinine, Ser 1.70 (*)    Glucose, Bld 119  (*)    Hemoglobin 12.9 (*)    HCT 38.0 (*)    All other components within normal limits  TROPONIN T, HIGH SENSITIVITY - Abnormal; Notable for the following components:   Troponin T High Sensitivity 31 (*)    All other components within normal limits  MAGNESIUM   ETHANOL  URINALYSIS, ROUTINE W REFLEX MICROSCOPIC  I-STAT CG4 LACTIC ACID, ED  TYPE AND SCREEN  TROPONIN T, HIGH SENSITIVITY    EKG: EKG Interpretation Date/Time:  Saturday September 24 2024 15:49:18 EST Ventricular Rate:  82 PR Interval:  239 QRS Duration:  167 QT Interval:  438 QTC Calculation: 512 R Axis:   -50  Text Interpretation: Atrial flutter/fibrillation RBBB and LAFB Abnormal T, consider ischemia, lateral leads Confirmed by Melvenia Motto [phone]) on 09/24/2024 6:40:46 PM  Radiology: CT CHEST ABDOMEN PELVIS WO CONTRAST Result Date: 09/24/2024 CLINICAL DATA:  Unwitnessed fall with right rib pain. EXAM: CT CHEST, ABDOMEN AND PELVIS WITHOUT CONTRAST TECHNIQUE: Multidetector CT imaging of the chest, abdomen and pelvis was performed following the standard protocol without IV contrast. RADIATION DOSE REDUCTION: This exam was performed according to the departmental dose-optimization program which includes automated exposure control, adjustment of the mA and/or kV according to patient size and/or use of iterative reconstruction technique. COMPARISON:  CTA chest, abdomen, and pelvis dated 06/06/2024 FINDINGS: CT CHEST FINDINGS Cardiovascular: Left chest wall ICD leads terminate in the right atrium and ventricle. Normal heart size. No significant pericardial fluid/thickening. Dilated ascending thoracic aortic aneurysm measures 4.0 cm. Coronary artery calcifications. Mitral annular calcification. Mediastinum/Nodes: Imaged thyroid  gland without nodules meeting criteria for imaging follow-up by size. Small hiatal hernia. No pathologically enlarged axillary, supraclavicular, mediastinal, or hilar lymph nodes. Lungs/Pleura: The central airways  are patent. Bilateral lower lobe subsegmental atelectasis. Biapical pleural-parenchymal scarring and calcified plaque. 3 mm subpleural right middle lobe nodule (5:103), unchanged. No pneumothorax. Unchanged small left pleural effusion. Musculoskeletal: Subtle cortical angulation of the lateral right fifth, sixth, and seventh ribs. Nondisplaced fractures of the anterolateral right eighth and ninth ribs. Additional nondisplaced fracture of the posterolateral and posterior eighth rib at the costovertebral junction and posteromedial ninth rib. Nondisplaced fracture of the right T8 transverse process. Degenerative changes of the shoulders. Median sternotomy wires are nondisplaced. Subcutaneous soft tissue reticulation along the inferolateral right chest wall and right flank. Multilevel degenerative changes of the thoracic spine. Unchanged mild vertebral body height loss of T9. CT ABDOMEN PELVIS FINDINGS Hepatobiliary: Scattered subcentimeter hypodensities, too small to characterize but likely cysts. Slightly prominent appearance of the caudate lobe lying medial to the IVC. No intra or extrahepatic biliary ductal dilation. Normal gallbladder. Pancreas: No focal lesions or main ductal dilation. Spleen: Normal in size without focal abnormality. Adrenals/Urinary Tract: No adrenal nodules. No suspicious renal lesions by noncontrast technique. Bilateral simple cysts, unchanged. No specific follow-up imaging recommended. No hydronephrosis or calculi. Trabeculated bladder contour with multiple small diverticula. Stomach/Bowel: Normal appearance of the stomach. 1.4 x 1.2 cm lipoma in the duodenal bulb (3:63). No evidence of bowel wall thickening, distention, or inflammatory changes. Colonic diverticulosis without acute diverticulitis. Normal appendix extends into the right inguinal hernia. Vascular/Lymphatic: Aortic atherosclerosis. Infrarenal abdominal aortic aneurysm status post repair with aorto bi-iliac stent graft. Excluded  aneurysm sac measures 5.0 x 4.9 cm, unchanged. No enlarged abdominal or pelvic lymph nodes. Reproductive: Prostate is unremarkable. Penile prosthesis. Deflated reservoir in the midline anterior pelvis. Other: No free fluid, fluid collection, or free air. Musculoskeletal: No acute or abnormal lytic or blastic osseous findings. Small fat-containing bilateral inguinal hernias. Multilevel degenerative changes of the lumbar spine. IMPRESSION: 1. Nondisplaced fractures of the right eighth and ninth ribs, as described. 2. Nondisplaced fracture of the right T8 transverse process. 3. Subtle cortical angulation of the lateral right fifth, sixth, and seventh ribs, which may represent nondisplaced fractures. 4. Subcutaneous  soft tissue contusion along the inferolateral right chest wall and right flank. 5. Unchanged small left pleural effusion. 6. No acute traumatic injury in the abdomen or pelvis. 7.  Aortic Atherosclerosis (ICD10-I70.0). Electronically Signed   By: Limin  Xu M.D.   On: 09/24/2024 18:00   CT CERVICAL SPINE WO CONTRAST Result Date: 09/24/2024 EXAM: CT CERVICAL SPINE WITHOUT CONTRAST 09/24/2024 05:14:41 PM TECHNIQUE: CT of the cervical spine was performed without the administration of intravenous contrast. Multiplanar reformatted images are provided for review. Automated exposure control, iterative reconstruction, and/or weight based adjustment of the mA/kV was utilized to reduce the radiation dose to as low as reasonably achievable. COMPARISON: CT of the cervical spine 12/01/2022. CLINICAL HISTORY: Polytrauma, blunt. Fall in shower yesterday. FINDINGS: BONES AND ALIGNMENT: No acute fracture or traumatic malalignment. DEGENERATIVE CHANGES: Slight degenerative anterolisthesis at C4-C5 is stable. Uncovertebral and facet spurring contribute to moderate foraminal narrowing bilaterally at C3-C4 and C4-C5. Moderate left foraminal stenosis is present at C5-C6 and C6-C7. SOFT TISSUES: No prevertebral soft tissue  swelling. Atherosclerotic changes are present at the aortic arch and great vessel origins. Bilateral carotid bifurcation stents are patent. IMPRESSION: 1. No acute cervical spine fracture or dislocation. Electronically signed by: Lonni Necessary MD 09/24/2024 05:28 PM EST RP Workstation: [id number]   CT HEAD WO CONTRAST Result Date: 09/24/2024 EXAM: CT HEAD WITHOUT CONTRAST 09/24/2024 05:14:41 PM TECHNIQUE: CT of the head was performed without the administration of intravenous contrast. Automated exposure control, iterative reconstruction, and/or weight based adjustment of the mA/kV was utilized to reduce the radiation dose to as low as reasonably achievable. COMPARISON: CT head without contrast 03/23/2023. CLINICAL HISTORY: Head trauma, moderate-severe. f aorta all in shower. FINDINGS: BRAIN AND VENTRICLES: No acute hemorrhage. No evidence of acute infarct. No hydrocephalus. No extra-axial collection. No mass effect or midline shift. Age-related atrophy. ORBITS: No acute abnormality. SINUSES: Partial opacification of the lower left mastoid air cells. Mucosal thickening of the right maxillary sinus. SOFT TISSUES AND SKULL: No acute soft tissue abnormality. No skull fracture. Atherosclerosis of skull base vasculature without hyperdense vessel or abnormal calcification. IMPRESSION: 1. No acute intracranial abnormality or evidence of acute trauma. Electronically signed by: Lonni Necessary MD 09/24/2024 05:25 PM EST RP Workstation: [id number]   DG Elbow Complete Right Result Date: 09/24/2024 CLINICAL DATA:  Blunt trauma EXAM: RIGHT ELBOW - COMPLETE 3+ VIEW COMPARISON:  None Available. FINDINGS: There is no evidence of fracture, dislocation, or joint effusion. There is no evidence of arthropathy or other focal bone abnormality. Soft tissues are unremarkable. IMPRESSION: Negative. Electronically Signed   By: Lynwood Landy Raddle M.D.   On: 09/24/2024 16:06   DG Chest Port 1 View Result Date: 09/24/2024 CLINICAL  DATA:  Blunt trauma EXAM: PORTABLE CHEST 1 VIEW COMPARISON:  June 06, 2024 FINDINGS: Stable cardiomediastinal silhouette. Left-sided defibrillator is noted. Status post coronary bypass graft. Lungs are clear. Degenerative changes seen involving the right glenohumeral joint. IMPRESSION: No active disease. Electronically Signed   By: Lynwood Landy Raddle M.D.   On: 09/24/2024 16:04   DG Pelvis Portable Result Date: 09/24/2024 CLINICAL DATA:  Blunt trauma EXAM: PORTABLE PELVIS 1-2 VIEWS COMPARISON:  None Available. FINDINGS: There is no evidence of pelvic fracture or diastasis. No pelvic bone lesions are seen. IMPRESSION: Negative. Electronically Signed   By: Lynwood Landy Raddle M.D.   On: 09/24/2024 16:01   DG Knee Right Port Result Date: 09/24/2024 CLINICAL DATA:  Blunt trauma EXAM: PORTABLE RIGHT KNEE - 1-2 VIEW COMPARISON:  None Available. FINDINGS:  No evidence of fracture, dislocation, or joint effusion. Moderate narrowing of medial joint space is noted. Soft tissues are unremarkable. IMPRESSION: Moderate degenerative joint disease is noted medially. No acute abnormality seen. Electronically Signed   By: Lynwood Landy Raddle M.D.   On: 09/24/2024 16:00     Procedures   Medications Ordered in the ED  lidocaine  (LIDODERM ) 5 % 1 patch (1 patch Transdermal Patch Applied 09/24/24 1542)  lactated ringers  bolus 500 mL (has no administration in time range)  fentaNYL  (SUBLIMAZE ) injection 100 mcg (has no administration in time range)  methocarbamol  (ROBAXIN ) tablet 500 mg (has no administration in time range)  fentaNYL  (SUBLIMAZE ) injection 100 mcg (100 mcg Intravenous Given 09/24/24 1539)                                    Medical Decision Making Amount and/or Complexity of Data Reviewed Labs: ordered. Radiology: ordered.  Risk Prescription drug management.   This patient presents to the ED for concern of syncope and collapse, this involves an extensive number of treatment options, and is a complaint that  carries with it a high risk of complications and morbidity.  The differential diagnosis includes arrhythmia, vasovagal episode, anemia, dehydration, metabolic derangements, acute injuries   Co morbidities / Chronic conditions that complicate the patient evaluation  HTN, asthma, OSA, CAD, atrial fibrillation, CHF, AAA, arthritis   Additional history obtained:  Additional history obtained from EMR External records from outside source obtained and reviewed including EMS   Lab Tests:  I Ordered, and personally interpreted labs.  The pertinent results include: Baseline anemia, no leukocytosis, baseline creatinine, normal electrolytes   Imaging Studies ordered:  I ordered imaging studies including x-ray of chest, pelvis, right knee, right elbow; CT of head, cervical spine, chest, abdomen, pelvis I independently visualized and interpreted imaging which showed multiple right-sided rib fractures; T8 transverse process fracture; contusion along right chest wall and right flank I agree with the radiologist interpretation   Cardiac Monitoring: / EKG:  The patient was maintained on a cardiac monitor.  I personally viewed and interpreted the cardiac monitored which showed an underlying rhythm of: Sinus rhythm   Problem List / ED Course / Critical interventions / Medication management  Patient presenting for right-sided rib pain following a syncope and fall that occurred yesterday.  On arrival in the ED, he is alert and oriented.  Most notable pain is right lateral ribs.  On exam, there is some mild bruising with some tenderness present.  Current breathing is unlabored.  Patient also has some mild pain in right elbow and knee.  Minor skin tears are noted on exam.  No significant swelling, erythema, or decreased range of motion.  Fentanyl  was ordered for analgesia.  Workup was initiated.  Lab work shows baseline anemia, no leukocytosis, slight elevation in troponin.  On imaging studies, patient was  found to have multiple right-sided rib fractures.  On reassessment, pain is only mildly improved.  Additional pain medicine was ordered.  Given his ongoing pain and concerning episode of syncope, patient to be admitted for further management. I ordered medication including IV fluids for hydration, fentanyl , Robaxin , lidocaine  patch for analgesia Reevaluation of the patient after these medicines showed that the patient improved I have reviewed the patients home medicines and have made adjustments as needed  Social Determinants of Health:  Lives at home with wife      Final diagnoses:  Syncope and collapse  Closed fracture of multiple ribs of right side, initial encounter    ED Discharge Orders     None          Melvenia Motto, MD 09/24/24 1842  "

## 2024-09-24 NOTE — ED Notes (Signed)
 Pts [employer] defibrillator interrogated. Awaiting fax report.

## 2024-09-25 ENCOUNTER — Other Ambulatory Visit: Payer: Self-pay

## 2024-09-25 ENCOUNTER — Observation Stay (HOSPITAL_COMMUNITY)

## 2024-09-25 DIAGNOSIS — I5042 Chronic combined systolic (congestive) and diastolic (congestive) heart failure: Secondary | ICD-10-CM | POA: Diagnosis present

## 2024-09-25 DIAGNOSIS — M25561 Pain in right knee: Secondary | ICD-10-CM | POA: Diagnosis present

## 2024-09-25 DIAGNOSIS — I2510 Atherosclerotic heart disease of native coronary artery without angina pectoris: Secondary | ICD-10-CM | POA: Diagnosis present

## 2024-09-25 DIAGNOSIS — S2241XA Multiple fractures of ribs, right side, initial encounter for closed fracture: Secondary | ICD-10-CM | POA: Diagnosis present

## 2024-09-25 DIAGNOSIS — Z7901 Long term (current) use of anticoagulants: Secondary | ICD-10-CM | POA: Diagnosis not present

## 2024-09-25 DIAGNOSIS — Z79890 Hormone replacement therapy (postmenopausal): Secondary | ICD-10-CM | POA: Diagnosis not present

## 2024-09-25 DIAGNOSIS — K219 Gastro-esophageal reflux disease without esophagitis: Secondary | ICD-10-CM | POA: Diagnosis present

## 2024-09-25 DIAGNOSIS — E7800 Pure hypercholesterolemia, unspecified: Secondary | ICD-10-CM | POA: Diagnosis present

## 2024-09-25 DIAGNOSIS — M25521 Pain in right elbow: Secondary | ICD-10-CM | POA: Diagnosis present

## 2024-09-25 DIAGNOSIS — S22068A Other fracture of T7-T8 thoracic vertebra, initial encounter for closed fracture: Secondary | ICD-10-CM | POA: Diagnosis present

## 2024-09-25 DIAGNOSIS — I25810 Atherosclerosis of coronary artery bypass graft(s) without angina pectoris: Secondary | ICD-10-CM | POA: Diagnosis present

## 2024-09-25 DIAGNOSIS — D631 Anemia in chronic kidney disease: Secondary | ICD-10-CM | POA: Diagnosis present

## 2024-09-25 DIAGNOSIS — I480 Paroxysmal atrial fibrillation: Secondary | ICD-10-CM | POA: Diagnosis present

## 2024-09-25 DIAGNOSIS — R55 Syncope and collapse: Secondary | ICD-10-CM

## 2024-09-25 DIAGNOSIS — I352 Nonrheumatic aortic (valve) stenosis with insufficiency: Secondary | ICD-10-CM | POA: Diagnosis present

## 2024-09-25 DIAGNOSIS — I452 Bifascicular block: Secondary | ICD-10-CM | POA: Diagnosis present

## 2024-09-25 DIAGNOSIS — N1832 Chronic kidney disease, stage 3b: Secondary | ICD-10-CM | POA: Diagnosis present

## 2024-09-25 DIAGNOSIS — I130 Hypertensive heart and chronic kidney disease with heart failure and stage 1 through stage 4 chronic kidney disease, or unspecified chronic kidney disease: Secondary | ICD-10-CM | POA: Diagnosis present

## 2024-09-25 DIAGNOSIS — Z79899 Other long term (current) drug therapy: Secondary | ICD-10-CM | POA: Diagnosis not present

## 2024-09-25 DIAGNOSIS — I350 Nonrheumatic aortic (valve) stenosis: Secondary | ICD-10-CM | POA: Diagnosis not present

## 2024-09-25 DIAGNOSIS — E039 Hypothyroidism, unspecified: Secondary | ICD-10-CM | POA: Diagnosis present

## 2024-09-25 DIAGNOSIS — G4733 Obstructive sleep apnea (adult) (pediatric): Secondary | ICD-10-CM | POA: Diagnosis present

## 2024-09-25 DIAGNOSIS — Y93E1 Activity, personal bathing and showering: Secondary | ICD-10-CM | POA: Diagnosis not present

## 2024-09-25 DIAGNOSIS — J45909 Unspecified asthma, uncomplicated: Secondary | ICD-10-CM | POA: Diagnosis present

## 2024-09-25 DIAGNOSIS — W182XXA Fall in (into) shower or empty bathtub, initial encounter: Secondary | ICD-10-CM | POA: Diagnosis present

## 2024-09-25 DIAGNOSIS — I714 Abdominal aortic aneurysm, without rupture, unspecified: Secondary | ICD-10-CM | POA: Diagnosis present

## 2024-09-25 DIAGNOSIS — Y92012 Bathroom of single-family (private) house as the place of occurrence of the external cause: Secondary | ICD-10-CM | POA: Diagnosis not present

## 2024-09-25 DIAGNOSIS — I4901 Ventricular fibrillation: Secondary | ICD-10-CM | POA: Diagnosis present

## 2024-09-25 DIAGNOSIS — Z4502 Encounter for adjustment and management of automatic implantable cardiac defibrillator: Secondary | ICD-10-CM | POA: Diagnosis not present

## 2024-09-25 DIAGNOSIS — I472 Ventricular tachycardia, unspecified: Secondary | ICD-10-CM | POA: Diagnosis present

## 2024-09-25 LAB — BLOOD GAS, VENOUS
Acid-base deficit: 0.4 mmol/L (ref 0.0–2.0)
Bicarbonate: 25.4 mmol/L (ref 20.0–28.0)
O2 Saturation: 28.7 %
Patient temperature: 37
pCO2, Ven: 45 mmHg (ref 44–60)
pH, Ven: 7.36 (ref 7.25–7.43)
pO2, Ven: <31 mmHg — CL (ref 32–45)

## 2024-09-25 LAB — CBC WITH DIFFERENTIAL/PLATELET
Abs Immature Granulocytes: 0.04 K/uL (ref 0.00–0.07)
Basophils Absolute: 0 K/uL (ref 0.0–0.1)
Basophils Relative: 1 %
Eosinophils Absolute: 0.3 K/uL (ref 0.0–0.5)
Eosinophils Relative: 3 %
HCT: 37.5 % — ABNORMAL LOW (ref 39.0–52.0)
Hemoglobin: 11.8 g/dL — ABNORMAL LOW (ref 13.0–17.0)
Immature Granulocytes: 1 %
Lymphocytes Relative: 14 %
Lymphs Abs: 1.2 K/uL (ref 0.7–4.0)
MCH: 30.5 pg (ref 26.0–34.0)
MCHC: 31.5 g/dL (ref 30.0–36.0)
MCV: 96.9 fL (ref 80.0–100.0)
Monocytes Absolute: 1.1 K/uL — ABNORMAL HIGH (ref 0.1–1.0)
Monocytes Relative: 12 %
Neutro Abs: 6.1 K/uL (ref 1.7–7.7)
Neutrophils Relative %: 69 %
Platelets: 121 K/uL — ABNORMAL LOW (ref 150–400)
RBC: 3.87 MIL/uL — ABNORMAL LOW (ref 4.22–5.81)
RDW: 14.5 % (ref 11.5–15.5)
WBC: 8.7 K/uL (ref 4.0–10.5)
nRBC: 0 % (ref 0.0–0.2)

## 2024-09-25 LAB — COMPREHENSIVE METABOLIC PANEL WITH GFR
ALT: 13 U/L (ref 0–44)
AST: 33 U/L (ref 15–41)
Albumin: 3.7 g/dL (ref 3.5–5.0)
Alkaline Phosphatase: 97 U/L (ref 38–126)
Anion gap: 9 (ref 5–15)
BUN: 27 mg/dL — ABNORMAL HIGH (ref 8–23)
CO2: 28 mmol/L (ref 22–32)
Calcium: 9.5 mg/dL (ref 8.9–10.3)
Chloride: 101 mmol/L (ref 98–111)
Creatinine, Ser: 1.53 mg/dL — ABNORMAL HIGH (ref 0.61–1.24)
GFR, Estimated: 43 mL/min — ABNORMAL LOW
Glucose, Bld: 110 mg/dL — ABNORMAL HIGH (ref 70–99)
Potassium: 4.8 mmol/L (ref 3.5–5.1)
Sodium: 138 mmol/L (ref 135–145)
Total Bilirubin: 0.5 mg/dL (ref 0.0–1.2)
Total Protein: 6.7 g/dL (ref 6.5–8.1)

## 2024-09-25 LAB — ECHOCARDIOGRAM COMPLETE
AR max vel: 1.11 cm2
AV Area VTI: 1.26 cm2
AV Area mean vel: 1.13 cm2
AV Mean grad: 18 mmHg
AV Peak grad: 32.9 mmHg
Ao pk vel: 2.87 m/s
Area-P 1/2: 3.85 cm2
Est EF: 50
Height: 75 in
S' Lateral: 4.1 cm
Weight: 3232 [oz_av]

## 2024-09-25 LAB — TROPONIN T, HIGH SENSITIVITY: Troponin T High Sensitivity: 34 ng/L — ABNORMAL HIGH (ref 0–19)

## 2024-09-25 LAB — PHOSPHORUS: Phosphorus: 3.3 mg/dL (ref 2.5–4.6)

## 2024-09-25 LAB — MAGNESIUM: Magnesium: 2.8 mg/dL — ABNORMAL HIGH (ref 1.7–2.4)

## 2024-09-25 LAB — PRO BRAIN NATRIURETIC PEPTIDE: Pro Brain Natriuretic Peptide: 2721 pg/mL — ABNORMAL HIGH

## 2024-09-25 MED ORDER — OXYCODONE HCL ER 10 MG PO T12A
10.0000 mg | EXTENDED_RELEASE_TABLET | Freq: Two times a day (BID) | ORAL | Status: AC
  Administered 2024-09-25 – 2024-09-26 (×3): 10 mg via ORAL
  Filled 2024-09-25 (×3): qty 1

## 2024-09-25 MED ORDER — PERFLUTREN LIPID MICROSPHERE
1.0000 mL | INTRAVENOUS | Status: AC | PRN
  Administered 2024-09-25: 3 mL via INTRAVENOUS

## 2024-09-25 NOTE — Progress Notes (Signed)
" °  Echocardiogram 2D Echocardiogram has been performed.  Koleen KANDICE Popper, RDCS 09/25/2024, 4:08 PM "

## 2024-09-25 NOTE — Progress Notes (Signed)
 Pt on unit. A&O x4. Tele monitor hooked up. CCMD called. Call light in reach

## 2024-09-25 NOTE — Care Management (Addendum)
 Transition of Care [facility]) - Inpatient Brief Assessment   Patient Details  Name: Eric Gordon MRN: [id number] Date of Birth: Nov 25, 1930  [facility] CM/SW Contact:    Corean JAYSON Canary, RN Phone Number: 09/25/2024, 2:28 PM   Clinical Narrative: 89 year old presented with syncope and fall. He lives with his wife. He uses a cane for ambulation.  He was to start OP PPT for his knee trouble PT assessed him and is recommending HH. Last home health was in 2024 with [facility].  Attempted to give MOON notice no answer on mobile or home.  Will try back later as tie permits 1700 Changed to INP IPCM will follow   Transition of Care Asessment: Insurance and Status: Insurance coverage has been reviewed Patient has primary care physician: Yes Home environment has been reviewed: Lives with spouse and family Prior level of function:: Independent with use of assistive device Prior/Current Home Services: No current home services Social Drivers of Health Review:  (Not assessed currently) Readmission risk has been reviewed: Yes Transition of care needs: transition of care needs identified, TOC will continue to follow

## 2024-09-25 NOTE — Progress Notes (Signed)
" ° °  Brief Progress Note   _____________________________________________________________________________________________________________  Patient Name: Eric Gordon Patient DOB: 1931/07/18 Date: @TODAY @      Data: Reviewed labs, notes, VS.     Action: No action needed at this time.    Response:    _____________________________________________________________________________________________________________  [facility] RN Expeditor Sharolyn JONETTA Batman Please contact us  directly via secure chat (search for [facility]) or by calling us  at [phone] [facility]).  "

## 2024-09-25 NOTE — ED Notes (Signed)
 RN informed provider of critical po2 of less than 31. No new orders at this time.

## 2024-09-25 NOTE — ED Notes (Signed)
 Echo at bedside

## 2024-09-25 NOTE — Evaluation (Signed)
 Physical Therapy Evaluation Patient Details Name: Eric Gordon MRN: [id number] DOB: 08-07-1931 Today's Date: 09/25/2024  History of Present Illness  Pt is 89 yo male who presents with syncopal episode on his deck with a fall resulting in L parieto-occipital scalp hematoma. PMH: paroxysmal A-fib on Eliquis , combined diastolic and systolic CHF, AAA status postrepair, bilateral carotid stenosis s/p stenting, large left ventricle pseudoaneurysm, hypertension, hyperlipidemia, hypothyroidism, BPH, and large L ventricular pseudoaneurysm found in 4/23.  Clinical Impression   Pt admitted secondary to problem above with deficits below. PTA patient was modified independent with ambulation with a cane (due to bad knees). He lives with wife in home with 5 steps to enter with rail and can live on the main level with bedroom, bathroom. Pt currently requires min assist with bed mobility (with significant pain), CGA for transfer to stand with RW, and ambulated 180 ft CGA. Educated in typical ways of getting in/out of bed with rib fractures with pt unwilling to try least painful option of going down to his left side because that's not the side he sleeps on (in bed). Discussed obtaining a recliner to sleep in as that will likely be most comfortable and easiest for him to get up/down from. Anticipate patient will benefit from PT to address problems listed below. Will continue to follow acutely to maximize functional mobility, independence, and safety.  Patient could benefit from HHPT on discharge (he was about to start OPPT at [facility] for his knees).          If plan is discharge home, recommend the following: A little help with bathing/dressing/bathroom;Assistance with cooking/housework;Assist for transportation;Help with stairs or ramp for entrance   Can travel by private vehicle        Equipment Recommendations None recommended by PT  Recommendations for Other Services  OT consult    Functional Status  Assessment Patient has had a recent decline in their functional status and demonstrates the ability to make significant improvements in function in a reasonable and predictable amount of time.     Precautions / Restrictions Precautions Precautions: Fall Recall of Precautions/Restrictions: Intact      Mobility  Bed Mobility Overal bed mobility: Needs Assistance Bed Mobility: Supine to Sit, Sit to Supine, Sit to Sidelying     Supine to sit: Min assist, HOB elevated, Used rails Sit to supine: Contact guard assist, HOB elevated, Used rails Sit to sidelying: Min assist General bed mobility comments: pt sitting edge of stretcher on arrival; reports his bed at home is adjustable and wanted to show me how he has been getting in/out of rt side of bed at home; pivoted hips and leaned back into supine with HOB elevated and able to lift legs himself; HOB elevated and used rail to pull up to sit EOB; educated on side to sit (especially if moves to other side of bed and gets up to his left--pt not sure he wants to do that and requested practice going to his rt) assist to raise legs onto bed and straighten pt's shoulders once he rolled supine (HOB nearly flat); discussed getting a recliner to sleep in would be easiest while ribs healing    Transfers Overall transfer level: Needs assistance Equipment used: Rolling walker (2 wheels) Transfers: Sit to/from Stand Sit to Stand: Contact guard assist, From elevated surface (ED stretcher)           General transfer comment: pt used correct hand placement; wife reports he has a much harder time at home from  his low chair with a cushion that slides forward as he does; again recommended he get a recliner (but not a lift chair as wife would like him to get)    Ambulation/Gait Ambulation/Gait assistance: Contact guard assist Gait Distance (Feet): 180 Feet Assistive device: Rolling walker (2 wheels) Gait Pattern/deviations: Step-through pattern, Trunk  flexed       General Gait Details: vc for upright posture and proximity to [employer]            Wheelchair Mobility     Tilt Bed    Modified Rankin (Stroke Patients Only)       Balance Overall balance assessment: Needs assistance Sitting-balance support: No upper extremity supported, Feet unsupported Sitting balance-Leahy Scale: Good     Standing balance support: Bilateral upper extremity supported, Reliant on assistive device for balance Standing balance-Leahy Scale: Poor                               Pertinent Vitals/Pain Pain Assessment Pain Assessment: Faces Faces Pain Scale: Hurts whole lot Pain Location: Rt flank Pain Descriptors / Indicators: Grimacing, Guarding, Moaning Pain Intervention(s): Limited activity within patient's tolerance, Monitored during session, Repositioned    Home Living Family/patient expects to be discharged to:: Private residence Living Arrangements: Spouse/significant other Available Help at Discharge: Family;Available 24 hours/day Type of Home: House Home Access: Stairs to enter Entrance Stairs-Rails: Left Entrance Stairs-Number of Steps: 5   Home Layout: Two level;Able to live on main level with bedroom/bathroom Home Equipment: Cane - single point;Rolling Walker (2 wheels);Shower seat - built in;Grab bars - tub/shower;Hand held shower head      Prior Function Prior Level of Function : Independent/Modified Independent             Mobility Comments: uses cane in RUE       Extremity/Trunk Assessment   Upper Extremity Assessment Upper Extremity Assessment: Defer to OT evaluation    Lower Extremity Assessment Lower Extremity Assessment: Generalized weakness (reports both knees are bad with arthritis)    Cervical / Trunk Assessment Cervical / Trunk Assessment: Kyphotic  Communication   Communication Communication: No apparent difficulties    Cognition Arousal: Alert Behavior During Therapy:  WFL for tasks assessed/performed                             Following commands: Intact       Cueing Cueing Techniques: Verbal cues     General Comments General comments (skin integrity, edema, etc.): initially on 2L with sats 100%; on RA 95% at rest and 92% during ambulation; RN ok with pt staying on RA; pt/wife informed; orthostatics negative with nursing    Exercises     Assessment/Plan    PT Assessment Patient needs continued PT services  PT Problem List Decreased activity tolerance;Decreased balance;Decreased mobility;Decreased knowledge of use of DME;Cardiopulmonary status limiting activity;Pain       PT Treatment Interventions DME instruction;Gait training;Stair training;Functional mobility training;Therapeutic activities;Therapeutic exercise;Balance training;Patient/family education    PT Goals (Current goals can be found in the Care Plan section)  Acute Rehab PT Goals Patient Stated Goal: decr pain PT Goal Formulation: With patient Time For Goal Achievement: 10/09/24 Potential to Achieve Goals: Good    Frequency Min 3X/week     Co-evaluation               AM-PAC PT 6 Clicks Mobility  Outcome  Measure Help needed turning from your back to your side while in a flat bed without using bedrails?: A Little Help needed moving from lying on your back to sitting on the side of a flat bed without using bedrails?: A Little Help needed moving to and from a bed to a chair (including a wheelchair)?: A Little Help needed standing up from a chair using your arms (e.g., wheelchair or bedside chair)?: A Little Help needed to walk in hospital room?: A Little Help needed climbing 3-5 steps with a railing? : A Little 6 Click Score: 18    End of Session   Activity Tolerance: Patient limited by pain Patient left: in bed;with call bell/phone within reach;with family/visitor present (ED stretcher) Nurse Communication: Mobility status;Other (comment) (96% at rest  on RA after ambulation) PT Visit Diagnosis: Difficulty in walking, not elsewhere classified (R26.2);Pain Pain - Right/Left: Right Pain - part of body:  (flank)    Time: 8687-8656 PT Time Calculation (min) (ACUTE ONLY): 31 min   Charges:   PT Evaluation $PT Eval Low Complexity: 1 Low PT Treatments $Gait Training: 8-22 mins PT General Charges $$ ACUTE PT VISIT: 1 Visit          Macario RAMAN, PT Acute Rehabilitation Services  Office [phone]   Macario SHAUNNA Soja 09/25/2024, 2:08 PM

## 2024-09-25 NOTE — Progress Notes (Signed)
 " PROGRESS NOTE    Eric Gordon  FMW:[id number] DOB: 1930-11-28 DOA: 09/24/2024 PCP: [name], MD  Outpatient Specialists:     Brief Narrative:  As per H&P done on presentation: Eric Gordon is a 89 y.o. male with medical history significant for moderately severe aortic stenosis, paroxysmal atrial fibrillation chronically anticoagulated on Edoxaban , paroxysmal ventricular tachycardia status post AICD placement in June 2023, chronic systolic/diastolic heart failure, coronary disease status post CABG in 2012, essential tremor, CKD 3B with baseline creatinine 1.5-2.0, spontaneous pneumothorax in 2020 status post chest tube in April 2020, VATS/talc  pleurodesis in July 2020, who is admitted to [facility] on 09/24/2024 with single episode of syncope without prodrome after presenting from home to [facility] ED complaining of right-sided chest pain after syncopal episode.    The patient reports a single episode of syncope that occurred without prodrome while in the shower yesterday, on 09/23/24.  He notes that he was standing, showering, in his normal state of health, completely asymptomatic, and that he subsequently awoke on the floor of the shower, laying on his right side, having nearly smashed the shower seat that he was not using.  Denies any preceding associated or ensuing central or left-sided chest discomfort or any associated shortness of breath palpitations, diaphoresis do any preceding dizziness, lightheadedness or subjective sensation of impending loss of consciousness.    He has experienced no additional episodes of syncope following yesterday's isolated event.  However, he presents to the ED this evening complaining of right lateral chest wall discomfort that started with the fall associated with a syncopal episode yesterday.  Reports the discomfort is sharp in nature, limited to the right/lateral right portion of the chest, worsening with deep inspiration.  No associated any  hemoptysis or significant shortness of breath.  He denies any central or left-sided chest discomfort.  No exertional chest pain.  He also notes some mild right elbow and right knee pain that also started acutely at the time of yesterday's fall.    In the setting of a history of paroxysmal atrial fibrillation, he is chronically anticoagulated on Edoxaban .  He is noted to be on mexiletine as well as metoprolol  25 mg p.o. nightly as an outpatient.  He is unsure if he hit his head as component of this fall.  Denies any acute headache or neck discomfort.  Otherwise, no additional blood thinners as an outpatient.   His cardiac history is also notable for moderate aortic stenosis, paroxysmal ventricular tachycardia status post AICD placed in June 2023 by Dr. Cindie at [facility], chronic systolic/diastolic heart failure, for which she follows with [name] of [facility] cardiology, on Lasix  and spironolactone .  Additionally, he has a history of coronary artery disease status post CABG in 2012.   Most recent echocardiogram occurred in April 2025, although this was noted to be of suboptimal quality, resulting in the ureter and this echocardiogram being unable to evaluate left ventricular systolic or left ventricular diastolic function.  Otherwise, this echocardiogram from April 2025 with felt to demonstrate mild LVH, normal right ventricular systolic function, mild mitral digitation, moderate aortic regurgitation, moderate aortic stenosis with a aortic valve area of 1.01 cm per continuity equation using VTI.   Prior to that, the patient had undergone echocardiogram in February 2024 which was notable for LVEF 45 to 50%, grade 1 diastolic dysfunction.   Medical history also notable for acute pulmonary embolism in April 2022, which, per chart review, was felt to be provoked as it occurred  shortly after CABG.  He also has a history of spontaneous pneumothorax in 2020 status post chest tube in April 2020 followed by  VATS/talc  pleurodesis in July 2020.   He presents to the ED this evening requesting pain control as a relates to his right lateral chest wall discomfort.   Per chart review, no previous high-sensitivity troponin T values available.  Most recent high sensitive troponin I data point occurred in September 2025, when troponin I was found to be 18 x 2 occurrences.     ED Course:  Vital signs in the ED were notable for the following: Afebrile; heart rates in the 70s to 80s; systolic blood pressures in the 1 teens 130s; respiratory rate 20-26, oxygen  saturation 94 to 100% on room air.   Labs were notable for the following: CMP was notable for the following: Sodium 130, potassium 4.1, bicarbonate 27, creatinine 1.63 compared to 1.82 on 07/21/2024, liver enzymes within normal limits.  Magnesium  level 1.7.  High sensitive troponin T was 31 x 2 occurrences, in the absence of any previously available troponin T data points.  Lactic acid 1.5.  CBC notable for white cell count 10,300, hemoglobin 12.6 unchanged from most recent prior hemoglobin value on 07/21/2024, platelet count 153.  INR 1.3.  Type and screen was completed in ED.  Urinalysis showed white blood cells with leukocyte esterase/nitrate negative and showed no evidence of hemoglobin.   Per my interpretation, EKG in ED demonstrated the following: In comparison to most recent prior EKG performed on 07/21/2024, today's EKG shows atrial fibrillation with heart rate 82, right bundle branch block, left anterior fascicular block, 2 inversions in V1 and V2, which appear unchanged measures prior EKG and appear consistent with his right bundle branch block, less than 1 mm ST depression in leads I, V1, V2, V3, with less than 1 mm ST depression in V1, V2, V3.  Unchanged most recent right EKG and also consistent with his right bundle branch block, with demonstrate no evidence of ST changes.   Imaging in the ED, per corresponding formal radiology read, was notable for  the following: 1 view chest x-ray showed no evidence of acute cardiopulmonary process.  Plain films of the pelvis showed no evidence of acute bony abnormality.  Plain films of the right elbow show no evidence of acute traumatic injury.  Plain films of the right knee showed moderate degenerative joint disease medially, but no evidence of acute traumatic injury.  CT head showed no evidence of acute intracranial process, including evidence of intracranial Atriance of acute infarct or CT cervical spine showed no evidence of acute cervical spine fracture or subluxation injury.  CT chest, abdomen, pelvis showed nondisplaced fractures of the right 8th and 9th ribs, without any associated pneumothorax, also demonstrating nondisplaced fracture of the right T8 transverse process, subcutaneous soft tissue contusion along the inferior lateral right chest wall and right flank, without any evidence of hemothorax, small left-sided pleural effusion, no pleural effusion on the left, will also showing no evidence of pulmonary infiltrate, or edema.  CT abdomen/pelvis showed no evidence of acute traumatic intra-abdominal or intrapelvic injury.   While in the ED, the following were administered: Fentanyl  100 mcg IV x 2 doses, lidocaine  patch, Robaxin  500 mg p.o. x 1 dose, lactated Ringer 's 500 cc bolus.   Subsequently, the patient was admitted for further evaluation management of single episode of syncope without prodrome, as well as pain control for acute right-sided nondisplaced fractures of the right 8th and 9th ribs  and acute nondisplaced fracture of right T8 transverse process.    09/25/2024: Patient was seen alongside patient's wife.  Patient was admitted with syncope, with associated right-sided rib fractures following the fall.  Patient continues to report right-sided chest pain, secondary to rib fractures.  Optimize pain control.  Apply lidocaine .  Start oxycodone  ER 10 mg p.o. twice daily for 2 days.  Consult  cardiology/EP team.  Patient has AICD.  Assessment & Plan:   Principal Problem:   Syncope Active Problems:   Paroxysmal atrial fibrillation (HCC)   Moderate aortic stenosis   CKD stage 3b, GFR 30-44 ml/min (HCC)   Acquired hypothyroidism   HLD (hyperlipidemia)   Chronic combined systolic and diastolic heart failure (HCC)   Paroxysmal ventricular tachycardia (HCC)   Multiple rib fractures   Elevated troponin   Fracture of thoracic transverse process (HCC)   Syncope: - Possible cardiac in origin. -Low threshold to consult the cardiology team. - Continue telemetry monitoring. - Complete workup. - Further management depend on hospital course.  Fracture of 8th and 9th rib: - Optimize pain control. - Lidocaine  to pain area. - Oxycodone  ER 10 mg p.o. twice daily for 2 days. - Incentive spirometry.  History of moderate aortic stenosis: - No shortness of breath or chest pain. - Patient was admitted with syncope.  CABG/coronary artery disease: - Stable. - No chest pain.  CHF/status post AICD placement: - Compensated.  Hypertension: - Currently controlled. - Continue to monitor closely. -Hypotension noted earlier (systolic blood pressure of 79 to 86 mmHg).  Hypothyroidism: -  Sleep apnea/CPAP use History of spontaneous pneumothorax.   DVT prophylaxis: Edoxaban . Code Status: Full code. Family Communication: Wife by bedside Disposition Plan: To depend on hospital course   Consultants:  Low threshold to consult the cardiology team  Procedures:  None.  Antimicrobials:  None   Subjective: -Right-sided rib cage pain  Objective: Vitals:   09/25/24 1100 09/25/24 1130 09/25/24 1322 09/25/24 1448  BP: (!) 120/51 (!) 111/55  (!) 129/55  Pulse: 81 68  74  Resp: (!) 41 (!) 25  (!) 21  Temp:   97.7 F (36.5 C)   TempSrc:   Oral   SpO2: 97% 100%  98%  Weight:      Height:       No intake or output data in the 24 hours ending 09/25/24 1502 Filed Weights    09/24/24 2300  Weight: 91.6 kg    Examination:  General exam: Appears calm and comfortable  Respiratory system: Clear to auscultation. Respiratory effort normal. Cardiovascular system: S1 & S2, with systolic murmur.   Gastrointestinal system: Abdomen is soft and nontender.  Central nervous system: Alert and oriented.  Extremities: Minimal lower extremity edema.  Data Reviewed: I have personally reviewed following labs and imaging studies  CBC: Recent Labs  Lab 09/24/24 1518 09/24/24 1529 09/25/24 0520  WBC 10.3  --  8.7  NEUTROABS 7.8*  --  6.1  HGB 12.6* 12.9* 11.8*  HCT 40.0 38.0* 37.5*  MCV 96.2  --  96.9  PLT 153  --  121*   Basic Metabolic Panel: Recent Labs  Lab 09/24/24 1518 09/24/24 1529 09/25/24 0520  NA 138 139 138  K 4.1 4.0 4.8  CL 100 101 101  CO2 27  --  28  GLUCOSE 122* 119* 110*  BUN 29* 30* 27*  CREATININE 1.63* 1.70* 1.53*  CALCIUM  9.7  --  9.5  MG 1.7  --  2.8*  PHOS  --   --  3.3   GFR: Estimated Creatinine Clearance: 37.6 mL/min (A) (by C-G formula based on SCr of 1.53 mg/dL (H)). Liver Function Tests: Recent Labs  Lab 09/24/24 1518 09/25/24 0520  AST 26 33  ALT 12 13  ALKPHOS 114 97  BILITOT 0.5 0.5  PROT 7.2 6.7  ALBUMIN  3.8 3.7   No results for input(s): LIPASE, AMYLASE in the last 168 hours. No results for input(s): AMMONIA in the last 168 hours. Coagulation Profile: Recent Labs  Lab 09/24/24 1518  INR 1.3*   Cardiac Enzymes: No results for input(s): CKTOTAL, CKMB, CKMBINDEX, TROPONINI in the last 168 hours. BNP (last 3 results) Recent Labs    09/24/24 1932 09/25/24 0520  PROBNP 2,681.0* 2,721.0*   HbA1C: No results for input(s): HGBA1C in the last 72 hours. CBG: No results for input(s): GLUCAP in the last 168 hours. Lipid Profile: No results for input(s): CHOL, HDL, LDLCALC, TRIG, CHOLHDL, LDLDIRECT in the last 72 hours. Thyroid  Function Tests: No results for input(s): TSH,  T4TOTAL, FREET4, T3FREE, THYROIDAB in the last 72 hours. Anemia Panel: No results for input(s): VITAMINB12, FOLATE, FERRITIN, TIBC, IRON, RETICCTPCT in the last 72 hours. Urine analysis:    Component Value Date/Time   COLORURINE YELLOW 09/24/2024 1800   APPEARANCEUR HAZY (A) 09/24/2024 1800   LABSPEC 1.012 09/24/2024 1800   PHURINE 5.0 09/24/2024 1800   GLUCOSEU NEGATIVE 09/24/2024 1800   HGBUR NEGATIVE 09/24/2024 1800   BILIRUBINUR NEGATIVE 09/24/2024 1800   KETONESUR NEGATIVE 09/24/2024 1800   PROTEINUR NEGATIVE 09/24/2024 1800   UROBILINOGEN 0.2 12/23/2010 2026   NITRITE NEGATIVE 09/24/2024 1800   LEUKOCYTESUR NEGATIVE 09/24/2024 1800   Sepsis Labs: @LABRCNTIP (procalcitonin:4,lacticidven:4)  )No results found for this or any previous visit (from the past 240 hours).       Radiology Studies: CT CHEST ABDOMEN PELVIS WO CONTRAST Result Date: 09/24/2024 CLINICAL DATA:  Unwitnessed fall with right rib pain. EXAM: CT CHEST, ABDOMEN AND PELVIS WITHOUT CONTRAST TECHNIQUE: Multidetector CT imaging of the chest, abdomen and pelvis was performed following the standard protocol without IV contrast. RADIATION DOSE REDUCTION: This exam was performed according to the departmental dose-optimization program which includes automated exposure control, adjustment of the mA and/or kV according to patient size and/or use of iterative reconstruction technique. COMPARISON:  CTA chest, abdomen, and pelvis dated 06/06/2024 FINDINGS: CT CHEST FINDINGS Cardiovascular: Left chest wall ICD leads terminate in the right atrium and ventricle. Normal heart size. No significant pericardial fluid/thickening. Dilated ascending thoracic aortic aneurysm measures 4.0 cm. Coronary artery calcifications. Mitral annular calcification. Mediastinum/Nodes: Imaged thyroid  gland without nodules meeting criteria for imaging follow-up by size. Small hiatal hernia. No pathologically enlarged axillary,  supraclavicular, mediastinal, or hilar lymph nodes. Lungs/Pleura: The central airways are patent. Bilateral lower lobe subsegmental atelectasis. Biapical pleural-parenchymal scarring and calcified plaque. 3 mm subpleural right middle lobe nodule (5:103), unchanged. No pneumothorax. Unchanged small left pleural effusion. Musculoskeletal: Subtle cortical angulation of the lateral right fifth, sixth, and seventh ribs. Nondisplaced fractures of the anterolateral right eighth and ninth ribs. Additional nondisplaced fracture of the posterolateral and posterior eighth rib at the costovertebral junction and posteromedial ninth rib. Nondisplaced fracture of the right T8 transverse process. Degenerative changes of the shoulders. Median sternotomy wires are nondisplaced. Subcutaneous soft tissue reticulation along the inferolateral right chest wall and right flank. Multilevel degenerative changes of the thoracic spine. Unchanged mild vertebral body height loss of T9. CT ABDOMEN PELVIS FINDINGS Hepatobiliary: Scattered subcentimeter hypodensities, too small to characterize but likely cysts. Slightly prominent appearance of  the caudate lobe lying medial to the IVC. No intra or extrahepatic biliary ductal dilation. Normal gallbladder. Pancreas: No focal lesions or main ductal dilation. Spleen: Normal in size without focal abnormality. Adrenals/Urinary Tract: No adrenal nodules. No suspicious renal lesions by noncontrast technique. Bilateral simple cysts, unchanged. No specific follow-up imaging recommended. No hydronephrosis or calculi. Trabeculated bladder contour with multiple small diverticula. Stomach/Bowel: Normal appearance of the stomach. 1.4 x 1.2 cm lipoma in the duodenal bulb (3:63). No evidence of bowel wall thickening, distention, or inflammatory changes. Colonic diverticulosis without acute diverticulitis. Normal appendix extends into the right inguinal hernia. Vascular/Lymphatic: Aortic atherosclerosis. Infrarenal  abdominal aortic aneurysm status post repair with aorto bi-iliac stent graft. Excluded aneurysm sac measures 5.0 x 4.9 cm, unchanged. No enlarged abdominal or pelvic lymph nodes. Reproductive: Prostate is unremarkable. Penile prosthesis. Deflated reservoir in the midline anterior pelvis. Other: No free fluid, fluid collection, or free air. Musculoskeletal: No acute or abnormal lytic or blastic osseous findings. Small fat-containing bilateral inguinal hernias. Multilevel degenerative changes of the lumbar spine. IMPRESSION: 1. Nondisplaced fractures of the right eighth and ninth ribs, as described. 2. Nondisplaced fracture of the right T8 transverse process. 3. Subtle cortical angulation of the lateral right fifth, sixth, and seventh ribs, which may represent nondisplaced fractures. 4. Subcutaneous soft tissue contusion along the inferolateral right chest wall and right flank. 5. Unchanged small left pleural effusion. 6. No acute traumatic injury in the abdomen or pelvis. 7.  Aortic Atherosclerosis (ICD10-I70.0). Electronically Signed   By: Limin  Xu M.D.   On: 09/24/2024 18:00   CT CERVICAL SPINE WO CONTRAST Result Date: 09/24/2024 EXAM: CT CERVICAL SPINE WITHOUT CONTRAST 09/24/2024 05:14:41 PM TECHNIQUE: CT of the cervical spine was performed without the administration of intravenous contrast. Multiplanar reformatted images are provided for review. Automated exposure control, iterative reconstruction, and/or weight based adjustment of the mA/kV was utilized to reduce the radiation dose to as low as reasonably achievable. COMPARISON: CT of the cervical spine 12/01/2022. CLINICAL HISTORY: Polytrauma, blunt. Fall in shower yesterday. FINDINGS: BONES AND ALIGNMENT: No acute fracture or traumatic malalignment. DEGENERATIVE CHANGES: Slight degenerative anterolisthesis at C4-C5 is stable. Uncovertebral and facet spurring contribute to moderate foraminal narrowing bilaterally at C3-C4 and C4-C5. Moderate left foraminal  stenosis is present at C5-C6 and C6-C7. SOFT TISSUES: No prevertebral soft tissue swelling. Atherosclerotic changes are present at the aortic arch and great vessel origins. Bilateral carotid bifurcation stents are patent. IMPRESSION: 1. No acute cervical spine fracture or dislocation. Electronically signed by: Lonni Necessary MD 09/24/2024 05:28 PM EST RP Workstation: [id number]   CT HEAD WO CONTRAST Result Date: 09/24/2024 EXAM: CT HEAD WITHOUT CONTRAST 09/24/2024 05:14:41 PM TECHNIQUE: CT of the head was performed without the administration of intravenous contrast. Automated exposure control, iterative reconstruction, and/or weight based adjustment of the mA/kV was utilized to reduce the radiation dose to as low as reasonably achievable. COMPARISON: CT head without contrast 03/23/2023. CLINICAL HISTORY: Head trauma, moderate-severe. f aorta all in shower. FINDINGS: BRAIN AND VENTRICLES: No acute hemorrhage. No evidence of acute infarct. No hydrocephalus. No extra-axial collection. No mass effect or midline shift. Age-related atrophy. ORBITS: No acute abnormality. SINUSES: Partial opacification of the lower left mastoid air cells. Mucosal thickening of the right maxillary sinus. SOFT TISSUES AND SKULL: No acute soft tissue abnormality. No skull fracture. Atherosclerosis of skull base vasculature without hyperdense vessel or abnormal calcification. IMPRESSION: 1. No acute intracranial abnormality or evidence of acute trauma. Electronically signed by: Lonni Necessary MD 09/24/2024 05:25 PM EST  RP Workstation: [id number]   DG Elbow Complete Right Result Date: 09/24/2024 CLINICAL DATA:  Blunt trauma EXAM: RIGHT ELBOW - COMPLETE 3+ VIEW COMPARISON:  None Available. FINDINGS: There is no evidence of fracture, dislocation, or joint effusion. There is no evidence of arthropathy or other focal bone abnormality. Soft tissues are unremarkable. IMPRESSION: Negative. Electronically Signed   By: Lynwood Landy Raddle M.D.    On: 09/24/2024 16:06   DG Chest Port 1 View Result Date: 09/24/2024 CLINICAL DATA:  Blunt trauma EXAM: PORTABLE CHEST 1 VIEW COMPARISON:  June 06, 2024 FINDINGS: Stable cardiomediastinal silhouette. Left-sided defibrillator is noted. Status post coronary bypass graft. Lungs are clear. Degenerative changes seen involving the right glenohumeral joint. IMPRESSION: No active disease. Electronically Signed   By: Lynwood Landy Raddle M.D.   On: 09/24/2024 16:04   DG Pelvis Portable Result Date: 09/24/2024 CLINICAL DATA:  Blunt trauma EXAM: PORTABLE PELVIS 1-2 VIEWS COMPARISON:  None Available. FINDINGS: There is no evidence of pelvic fracture or diastasis. No pelvic bone lesions are seen. IMPRESSION: Negative. Electronically Signed   By: Lynwood Landy Raddle M.D.   On: 09/24/2024 16:01   DG Knee Right Port Result Date: 09/24/2024 CLINICAL DATA:  Blunt trauma EXAM: PORTABLE RIGHT KNEE - 1-2 VIEW COMPARISON:  None Available. FINDINGS: No evidence of fracture, dislocation, or joint effusion. Moderate narrowing of medial joint space is noted. Soft tissues are unremarkable. IMPRESSION: Moderate degenerative joint disease is noted medially. No acute abnormality seen. Electronically Signed   By: Lynwood Landy Raddle M.D.   On: 09/24/2024 16:00        Scheduled Meds:  atorvastatin   80 mg Oral QHS   edoxaban   30 mg Oral Q24H   furosemide   40 mg Oral Daily   levothyroxine   75 mcg Oral Q0600   lidocaine   1 patch Transdermal Q24H   metoprolol  succinate  25 mg Oral Daily   mexiletine  150 mg Oral BID   pantoprazole   40 mg Oral QAC breakfast   spironolactone   12.5 mg Oral BID   Continuous Infusions:   LOS: 0 days    Time spent: 55 minutes.    Leatrice Chapel, MD  [facility] 7PM-7AM contact night coverage as above    "

## 2024-09-26 ENCOUNTER — Telehealth: Payer: Self-pay

## 2024-09-26 DIAGNOSIS — Z4502 Encounter for adjustment and management of automatic implantable cardiac defibrillator: Secondary | ICD-10-CM

## 2024-09-26 DIAGNOSIS — I4901 Ventricular fibrillation: Principal | ICD-10-CM

## 2024-09-26 LAB — CBC WITH DIFFERENTIAL/PLATELET
Abs Immature Granulocytes: 0.04 K/uL (ref 0.00–0.07)
Basophils Absolute: 0 K/uL (ref 0.0–0.1)
Basophils Relative: 0 %
Eosinophils Absolute: 0.4 K/uL (ref 0.0–0.5)
Eosinophils Relative: 5 %
HCT: 35.6 % — ABNORMAL LOW (ref 39.0–52.0)
Hemoglobin: 11.2 g/dL — ABNORMAL LOW (ref 13.0–17.0)
Immature Granulocytes: 1 %
Lymphocytes Relative: 17 %
Lymphs Abs: 1.3 K/uL (ref 0.7–4.0)
MCH: 30.1 pg (ref 26.0–34.0)
MCHC: 31.5 g/dL (ref 30.0–36.0)
MCV: 95.7 fL (ref 80.0–100.0)
Monocytes Absolute: 1.1 K/uL — ABNORMAL HIGH (ref 0.1–1.0)
Monocytes Relative: 14 %
Neutro Abs: 5.2 K/uL (ref 1.7–7.7)
Neutrophils Relative %: 63 %
Platelets: 136 K/uL — ABNORMAL LOW (ref 150–400)
RBC: 3.72 MIL/uL — ABNORMAL LOW (ref 4.22–5.81)
RDW: 14.5 % (ref 11.5–15.5)
WBC: 8.1 K/uL (ref 4.0–10.5)
nRBC: 0 % (ref 0.0–0.2)

## 2024-09-26 LAB — RENAL FUNCTION PANEL
Albumin: 3.4 g/dL — ABNORMAL LOW (ref 3.5–5.0)
Anion gap: 8 (ref 5–15)
BUN: 36 mg/dL — ABNORMAL HIGH (ref 8–23)
CO2: 28 mmol/L (ref 22–32)
Calcium: 9.1 mg/dL (ref 8.9–10.3)
Chloride: 102 mmol/L (ref 98–111)
Creatinine, Ser: 1.7 mg/dL — ABNORMAL HIGH (ref 0.61–1.24)
GFR, Estimated: 38 mL/min — ABNORMAL LOW
Glucose, Bld: 118 mg/dL — ABNORMAL HIGH (ref 70–99)
Phosphorus: 4.3 mg/dL (ref 2.5–4.6)
Potassium: 4.2 mmol/L (ref 3.5–5.1)
Sodium: 138 mmol/L (ref 135–145)

## 2024-09-26 LAB — MAGNESIUM: Magnesium: 1.8 mg/dL (ref 1.7–2.4)

## 2024-09-26 MED ORDER — MEXILETINE HCL 150 MG PO CAPS
150.0000 mg | ORAL_CAPSULE | Freq: Three times a day (TID) | ORAL | Status: DC
  Administered 2024-09-26 – 2024-09-27 (×2): 150 mg via ORAL
  Filled 2024-09-26 (×3): qty 1

## 2024-09-26 MED ORDER — METOPROLOL SUCCINATE ER 25 MG PO TB24
25.0000 mg | ORAL_TABLET | Freq: Two times a day (BID) | ORAL | Status: DC
  Administered 2024-09-26 – 2024-09-27 (×2): 25 mg via ORAL
  Filled 2024-09-26 (×2): qty 1

## 2024-09-26 NOTE — Progress Notes (Signed)
 " PROGRESS NOTE    Eric Gordon  FMW:[id number] DOB: 06/30/1931 DOA: 09/24/2024 PCP: [name], MD  Outpatient Specialists:     Brief Narrative:  As per H&P done on presentation: Eric Gordon is a 89 y.o. male with medical history significant for moderately severe aortic stenosis, paroxysmal atrial fibrillation chronically anticoagulated on Edoxaban , paroxysmal ventricular tachycardia status post AICD placement in June 2023, chronic systolic/diastolic heart failure, coronary disease status post CABG in 2012, essential tremor, CKD 3B with baseline creatinine 1.5-2.0, spontaneous pneumothorax in 2020 status post chest tube in April 2020, VATS/talc  pleurodesis in July 2020, who is admitted to [facility] on 09/24/2024 with single episode of syncope without prodrome after presenting from home to [facility] ED complaining of right-sided chest pain after syncopal episode.    The patient reports a single episode of syncope that occurred without prodrome while in the shower yesterday, on 09/23/24.  He notes that he was standing, showering, in his normal state of health, completely asymptomatic, and that he subsequently awoke on the floor of the shower, laying on his right side, having nearly smashed the shower seat that he was not using.  Denies any preceding associated or ensuing central or left-sided chest discomfort or any associated shortness of breath palpitations, diaphoresis do any preceding dizziness, lightheadedness or subjective sensation of impending loss of consciousness.    He has experienced no additional episodes of syncope following yesterday's isolated event.  However, he presents to the ED this evening complaining of right lateral chest wall discomfort that started with the fall associated with a syncopal episode yesterday.  Reports the discomfort is sharp in nature, limited to the right/lateral right portion of the chest, worsening with deep inspiration.  No associated any  hemoptysis or significant shortness of breath.  He denies any central or left-sided chest discomfort.  No exertional chest pain.  He also notes some mild right elbow and right knee pain that also started acutely at the time of yesterday's fall.    In the setting of a history of paroxysmal atrial fibrillation, he is chronically anticoagulated on Edoxaban .  He is noted to be on mexiletine as well as metoprolol  25 mg p.o. nightly as an outpatient.  He is unsure if he hit his head as component of this fall.  Denies any acute headache or neck discomfort.  Otherwise, no additional blood thinners as an outpatient.   His cardiac history is also notable for moderate aortic stenosis, paroxysmal ventricular tachycardia status post AICD placed in June 2023 by Dr. Cindie at [facility], chronic systolic/diastolic heart failure, for which she follows with [name] of [facility] cardiology, on Lasix  and spironolactone .  Additionally, he has a history of coronary artery disease status post CABG in 2012.   Most recent echocardiogram occurred in April 2025, although this was noted to be of suboptimal quality, resulting in the ureter and this echocardiogram being unable to evaluate left ventricular systolic or left ventricular diastolic function.  Otherwise, this echocardiogram from April 2025 with felt to demonstrate mild LVH, normal right ventricular systolic function, mild mitral digitation, moderate aortic regurgitation, moderate aortic stenosis with a aortic valve area of 1.01 cm per continuity equation using VTI.   Prior to that, the patient had undergone echocardiogram in February 2024 which was notable for LVEF 45 to 50%, grade 1 diastolic dysfunction.   Medical history also notable for acute pulmonary embolism in April 2022, which, per chart review, was felt to be provoked as it occurred  shortly after CABG.  He also has a history of spontaneous pneumothorax in 2020 status post chest tube in April 2020 followed by  VATS/talc  pleurodesis in July 2020.   He presents to the ED this evening requesting pain control as a relates to his right lateral chest wall discomfort.   Per chart review, no previous high-sensitivity troponin T values available.  Most recent high sensitive troponin I data point occurred in September 2025, when troponin I was found to be 18 x 2 occurrences.     ED Course:  Vital signs in the ED were notable for the following: Afebrile; heart rates in the 70s to 80s; systolic blood pressures in the 1 teens 130s; respiratory rate 20-26, oxygen  saturation 94 to 100% on room air.   Labs were notable for the following: CMP was notable for the following: Sodium 130, potassium 4.1, bicarbonate 27, creatinine 1.63 compared to 1.82 on 07/21/2024, liver enzymes within normal limits.  Magnesium  level 1.7.  High sensitive troponin T was 31 x 2 occurrences, in the absence of any previously available troponin T data points.  Lactic acid 1.5.  CBC notable for white cell count 10,300, hemoglobin 12.6 unchanged from most recent prior hemoglobin value on 07/21/2024, platelet count 153.  INR 1.3.  Type and screen was completed in ED.  Urinalysis showed white blood cells with leukocyte esterase/nitrate negative and showed no evidence of hemoglobin.   Per my interpretation, EKG in ED demonstrated the following: In comparison to most recent prior EKG performed on 07/21/2024, today's EKG shows atrial fibrillation with heart rate 82, right bundle branch block, left anterior fascicular block, 2 inversions in V1 and V2, which appear unchanged measures prior EKG and appear consistent with his right bundle branch block, less than 1 mm ST depression in leads I, V1, V2, V3, with less than 1 mm ST depression in V1, V2, V3.  Unchanged most recent right EKG and also consistent with his right bundle branch block, with demonstrate no evidence of ST changes.   Imaging in the ED, per corresponding formal radiology read, was notable for  the following: 1 view chest x-ray showed no evidence of acute cardiopulmonary process.  Plain films of the pelvis showed no evidence of acute bony abnormality.  Plain films of the right elbow show no evidence of acute traumatic injury.  Plain films of the right knee showed moderate degenerative joint disease medially, but no evidence of acute traumatic injury.  CT head showed no evidence of acute intracranial process, including evidence of intracranial Atriance of acute infarct or CT cervical spine showed no evidence of acute cervical spine fracture or subluxation injury.  CT chest, abdomen, pelvis showed nondisplaced fractures of the right 8th and 9th ribs, without any associated pneumothorax, also demonstrating nondisplaced fracture of the right T8 transverse process, subcutaneous soft tissue contusion along the inferior lateral right chest wall and right flank, without any evidence of hemothorax, small left-sided pleural effusion, no pleural effusion on the left, will also showing no evidence of pulmonary infiltrate, or edema.  CT abdomen/pelvis showed no evidence of acute traumatic intra-abdominal or intrapelvic injury.   While in the ED, the following were administered: Fentanyl  100 mcg IV x 2 doses, lidocaine  patch, Robaxin  500 mg p.o. x 1 dose, lactated Ringer 's 500 cc bolus.   Subsequently, the patient was admitted for further evaluation management of single episode of syncope without prodrome, as well as pain control for acute right-sided nondisplaced fractures of the right 8th and 9th ribs  and acute nondisplaced fracture of right T8 transverse process.    09/25/2024: Patient was seen alongside patient's wife.  Patient was admitted with syncope, with associated right-sided rib fractures following the fall.  Patient continues to report right-sided chest pain, secondary to rib fractures.  Optimize pain control.  Apply lidocaine .  Start oxycodone  ER 10 mg p.o. twice daily for 2 days.  Consult  cardiology/EP team.  Patient has AICD.  09/26/2024: Patient seen alongside patient's wife.  Rib cage pain is better controlled.  Cardiology input is highly appreciated.  Syncope is thought to be related to V. tach/ICD firing.  Cardiology/EP teams are directing care.  Optimize pain control.  Assessment & Plan:   Principal Problem:   Syncope Active Problems:   Paroxysmal atrial fibrillation (HCC)   Moderate aortic stenosis   CKD stage 3b, GFR 30-44 ml/min (HCC)   Acquired hypothyroidism   HLD (hyperlipidemia)   Chronic combined systolic and diastolic heart failure (HCC)   Paroxysmal ventricular tachycardia (HCC)   Multiple rib fractures   Elevated troponin   Fracture of thoracic transverse process (HCC)   Syncope: - Likely cardiac in origin. - Input from cardiology and EP team is highly appreciated. - Syncope is thought to be related to V. tach and ICD firing.. - Continue telemetry monitoring.  Fracture of 8th and 9th rib: - Optimize pain control. - Lidocaine  to pain area. - Oxycodone  ER 10 mg p.o. twice daily for 2 days. - Incentive spirometry.  History of moderate aortic stenosis: - No shortness of breath or chest pain. - Patient was admitted with syncope.  CABG/coronary artery disease: - Stable. - No chest pain.  CHF/status post AICD placement: - Compensated.  Hypertension: - Currently controlled. - Continue to monitor closely. -Hypotension noted earlier (systolic blood pressure of 79 to 86 mmHg).  Hypothyroidism: -  Sleep apnea/CPAP use History of spontaneous pneumothorax.   DVT prophylaxis: Edoxaban . Code Status: Full code. Family Communication: Wife by bedside Disposition Plan: To depend on hospital course   Consultants:  Low threshold to consult the cardiology team  Procedures:  None.  Antimicrobials:  None   Subjective: -Right-sided rib cage pain  Objective: Vitals:   09/26/24 0622 09/26/24 0723 09/26/24 1149 09/26/24 1638  BP:  (!)  127/55 (!) 114/41 (!) 126/37  Pulse:  77 72 73  Resp:  16 16 16   Temp:  98.1 F (36.7 C) 97.7 F (36.5 C) 97.6 F (36.4 C)  TempSrc:  Oral Oral Oral  SpO2:  92% 96% 96%  Weight: 93.7 kg     Height:        Intake/Output Summary (Last 24 hours) at 09/26/2024 1843 Last data filed at 09/26/2024 9388 Gross per 24 hour  Intake --  Output 550 ml  Net -550 ml   Filed Weights   09/24/24 2300 09/26/24 0622  Weight: 91.6 kg 93.7 kg    Examination:  General exam: Appears calm and comfortable  Respiratory system: Clear to auscultation. Respiratory effort normal. Cardiovascular system: S1 & S2, with systolic murmur.   Gastrointestinal system: Abdomen is soft and nontender.  Central nervous system: Alert and oriented.  Extremities: Minimal lower extremity edema.  Data Reviewed: I have personally reviewed following labs and imaging studies  CBC: Recent Labs  Lab 09/24/24 1518 09/24/24 1529 09/25/24 0520 09/26/24 0246  WBC 10.3  --  8.7 8.1  NEUTROABS 7.8*  --  6.1 5.2  HGB 12.6* 12.9* 11.8* 11.2*  HCT 40.0 38.0* 37.5* 35.6*  MCV 96.2  --  96.9 95.7  PLT 153  --  121* 136*   Basic Metabolic Panel: Recent Labs  Lab 09/24/24 1518 09/24/24 1529 09/25/24 0520 09/26/24 0246  NA 138 139 138 138  K 4.1 4.0 4.8 4.2  CL 100 101 101 102  CO2 27  --  28 28  GLUCOSE 122* 119* 110* 118*  BUN 29* 30* 27* 36*  CREATININE 1.63* 1.70* 1.53* 1.70*  CALCIUM  9.7  --  9.5 9.1  MG 1.7  --  2.8* 1.8  PHOS  --   --  3.3 4.3   GFR: Estimated Creatinine Clearance: 33.8 mL/min (A) (by C-G formula based on SCr of 1.7 mg/dL (H)). Liver Function Tests: Recent Labs  Lab 09/24/24 1518 09/25/24 0520 09/26/24 0246  AST 26 33  --   ALT 12 13  --   ALKPHOS 114 97  --   BILITOT 0.5 0.5  --   PROT 7.2 6.7  --   ALBUMIN  3.8 3.7 3.4*   No results for input(s): LIPASE, AMYLASE in the last 168 hours. No results for input(s): AMMONIA in the last 168 hours. Coagulation Profile: Recent Labs   Lab 09/24/24 1518  INR 1.3*   Cardiac Enzymes: No results for input(s): CKTOTAL, CKMB, CKMBINDEX, TROPONINI in the last 168 hours. BNP (last 3 results) Recent Labs    09/24/24 1932 09/25/24 0520  PROBNP 2,681.0* 2,721.0*   HbA1C: No results for input(s): HGBA1C in the last 72 hours. CBG: No results for input(s): GLUCAP in the last 168 hours. Lipid Profile: No results for input(s): CHOL, HDL, LDLCALC, TRIG, CHOLHDL, LDLDIRECT in the last 72 hours. Thyroid  Function Tests: No results for input(s): TSH, T4TOTAL, FREET4, T3FREE, THYROIDAB in the last 72 hours. Anemia Panel: No results for input(s): VITAMINB12, FOLATE, FERRITIN, TIBC, IRON, RETICCTPCT in the last 72 hours. Urine analysis:    Component Value Date/Time   COLORURINE YELLOW 09/24/2024 1800   APPEARANCEUR HAZY (A) 09/24/2024 1800   LABSPEC 1.012 09/24/2024 1800   PHURINE 5.0 09/24/2024 1800   GLUCOSEU NEGATIVE 09/24/2024 1800   HGBUR NEGATIVE 09/24/2024 1800   BILIRUBINUR NEGATIVE 09/24/2024 1800   KETONESUR NEGATIVE 09/24/2024 1800   PROTEINUR NEGATIVE 09/24/2024 1800   UROBILINOGEN 0.2 12/23/2010 2026   NITRITE NEGATIVE 09/24/2024 1800   LEUKOCYTESUR NEGATIVE 09/24/2024 1800   Sepsis Labs: @LABRCNTIP (procalcitonin:4,lacticidven:4)  )No results found for this or any previous visit (from the past 240 hours).       Radiology Studies: ECHOCARDIOGRAM COMPLETE Result Date: 09/25/2024    ECHOCARDIOGRAM REPORT   Patient Name:   JOSEY FORCIER Date of Exam: 09/25/2024 Medical Rec #:  [id number]           Height:       75.0 in Accession #:    [id number]          Weight:       202.0 lb Date of Birth:  [birth year]          BSA:          2.204 m Patient Age:    89 years            BP:           111/55 mmHg Patient Gender: M                   HR:           71 bpm. Exam Location:  Inpatient Procedure: 2D Echo, Cardiac Doppler, Color Doppler, Intracardiac Opacification  Agent and PEDOF (Both Spectral and Color Flow Doppler were utilized            during procedure). Indications:    Syncope R55  History:        Patient has prior history of Echocardiogram examinations, most                 recent 01/12/2024. CAD, Prior CABG, Aortic Valve Disease,                 Signs/Symptoms:Syncope; Risk Factors:Hypertension, Dyslipidemia,                 CKD and Sleep Apnea.  Sonographer:    Koleen Popper RDCS Referring Phys: JUSTIN B HOWERTER  Sonographer Comments: Pt in pain throughout exam due to injury from fall. IMPRESSIONS  1. Left ventricular ejection fraction, by estimation, is 50%. The left ventricle has mildly decreased function. The left ventricle demonstrates global hypokinesis. There is mild asymmetric left ventricular hypertrophy of the basal-septal segment. Left ventricular diastolic parameters are indeterminate.  2. Right ventricular systolic function is normal. The right ventricular size is normal. There is normal pulmonary artery systolic pressure.  3. The mitral valve is degenerative. No evidence of mitral valve regurgitation. No evidence of mitral stenosis. Moderate mitral annular calcification.  4. The aortic valve is tricuspid and is moderately calcified. There is mild aortic regurgitation. There is moderate aortic stenosis with gradients that are likely underestimated in the setting of a low SVI = 31 (AoV Vmax = 2.87 m/s, AoV mn Grad = 18 mmHg, AVA VTI = 1.26, AVA/BSA = 0.57). Comparison(s): No significant change from prior study. Conclusion(s)/Recommendation(s): No clear cause for syncope identified. Recommend repeat echocardiogram in 12 months for surveillance of aortic stenosis. FINDINGS  Left Ventricle: Left ventricular ejection fraction, by estimation, is 50%. The left ventricle has mildly decreased function. The left ventricle demonstrates global hypokinesis. Definity  contrast agent was given IV to delineate the left ventricular endocardial borders. The left  ventricular internal cavity size was normal in size. There is mild asymmetric left ventricular hypertrophy of the basal-septal segment. Left ventricular diastolic parameters are indeterminate. Right Ventricle: The right ventricular size is normal. No increase in right ventricular wall thickness. Right ventricular systolic function is normal. There is normal pulmonary artery systolic pressure. The tricuspid regurgitant velocity is 2.66 m/s, and  with an assumed right atrial pressure of 3 mmHg, the estimated right ventricular systolic pressure is 31.3 mmHg. Left Atrium: Left atrial size was normal in size. Right Atrium: Right atrial size was normal in size. Pericardium: There is no evidence of pericardial effusion. Mitral Valve: The mitral valve is degenerative in appearance. Moderate mitral annular calcification. No evidence of mitral valve regurgitation. No evidence of mitral valve stenosis. Tricuspid Valve: The tricuspid valve is normal in structure. Tricuspid valve regurgitation is mild . No evidence of tricuspid stenosis. Aortic Valve: The aortic valve is tricuspid and is moderately calcified. There is mild aortic regurgitation. There is moderate aortic stenosis with gradients that are likely underestimated in the setting of a low SVI = 31 (AoV Vmax = 2.87 m/s, AoV mn Grad = 18 mmHg, AVA VTI = 1.26, AVA/BSA = 0.57). Aortic valve mean gradient measures 18.0 mmHg. Aortic valve peak gradient measures 32.9 mmHg. Aortic valve area, by VTI measures 1.26 cm. Pulmonic Valve: The pulmonic valve was normal in structure. Pulmonic valve regurgitation is mild. No evidence of pulmonic stenosis. Aorta: The aortic root and ascending aorta are structurally normal, with no evidence  of dilitation. Venous: The inferior vena cava was not well visualized. IAS/Shunts: The interatrial septum was not well visualized. Additional Comments: A device lead is visualized.  LEFT VENTRICLE PLAX 2D LVIDd:         5.70 cm   Diastology LVIDs:          4.10 cm   LV e' medial:    6.25 cm/s LV PW:         1.10 cm   LV E/e' medial:  11.4 LV IVS:        1.30 cm   LV e' lateral:   7.81 cm/s LVOT diam:     2.00 cm   LV E/e' lateral: 9.1 LV SV:         68 LV SV Index:   31 LVOT Area:     3.14 cm  RIGHT VENTRICLE             IVC RV S prime:     11.20 cm/s  IVC diam: 2.20 cm TAPSE (M-mode): 1.7 cm LEFT ATRIUM             Index LA diam:        4.70 cm 2.13 cm/m LA Vol (A2C):   37.6 ml 17.06 ml/m LA Vol (A4C):   46.5 ml 21.10 ml/m LA Biplane Vol: 42.1 ml 19.10 ml/m  AORTIC VALVE AV Area (Vmax):    1.11 cm AV Area (Vmean):   1.13 cm AV Area (VTI):     1.26 cm AV Vmax:           287.00 cm/s AV Vmean:          194.000 cm/s AV VTI:            0.535 m AV Peak Grad:      32.9 mmHg AV Mean Grad:      18.0 mmHg LVOT Vmax:         101.00 cm/s LVOT Vmean:        69.800 cm/s LVOT VTI:          0.215 m LVOT/AV VTI ratio: 0.40  AORTA Ao Root diam: 3.40 cm MITRAL VALVE               TRICUSPID VALVE MV Area (PHT): 3.85 cm    TR Peak grad:   28.3 mmHg MV Decel Time: 197 msec    TR Vmax:        266.00 cm/s MV E velocity: 71.30 cm/s MV A velocity: 88.00 cm/s  SHUNTS MV E/A ratio:  0.81        Systemic VTI:  0.22 m                            Systemic Diam: 2.00 cm Georganna Archer Electronically signed by Georganna Archer Signature Date/Time: 09/25/2024/6:31:29 PM    Final         Scheduled Meds:  atorvastatin   80 mg Oral QHS   edoxaban   30 mg Oral Q24H   furosemide   40 mg Oral Daily   levothyroxine   75 mcg Oral Q0600   lidocaine   1 patch Transdermal Q24H   metoprolol  succinate  25 mg Oral BID   mexiletine  150 mg Oral Q8H   oxyCODONE   10 mg Oral Q12H   pantoprazole   40 mg Oral QAC breakfast   spironolactone   12.5 mg Oral BID   Continuous Infusions:   LOS: 1 day    Time spent:  35 minutes.    Leatrice Chapel, MD  [facility] 7PM-7AM contact night coverage as above    "

## 2024-09-26 NOTE — Plan of Care (Signed)

## 2024-09-26 NOTE — Consult Note (Signed)
 "  Cardiology Consultation   Patient ID: Eric Gordon MRN: [id number]; DOB: 06/25/1931  Admit date: 09/24/2024 Date of Consult: 09/26/2024  PCP:  Eric Rush, MD   [facility] Providers Cardiologist:  None  Electrophysiologist:  Eric ONEIDA HOLTS, MD  Advanced Heart Failure:  Eric Shuck, MD       Patient Profile: Eric Gordon is a 89 y.o. male with a hx of CAD s/p CABG in 2012, HFpEF, Moderate AS, LV pseudoaneurysm, VT s/p ICD on mexilitine with prior ICD shocks, paroxysmal A-fib on Eliquis , AAA s/p EVAR '23, carotid stenosis s/p R TCAR '22, CKD3, HLD, HTN, Asthma, OSA on CPAP, hx PE after CABG  who is being seen 09/26/2024 for the evaluation of syncope   History of Present Illness: [name] has had multiple episodes of VT in the past. Admitted 02/2022 with syncope, no evidence for ACS. EP study with inducible VT. Underwent dual chamber Biotronik ICD placement. VT again in 9/23, treated with ATP. Mexiletine started for recurrent VT. Admitted 11/2022 with syncopal events, one event resulting in trauma to head and back. One event coincide with VT. Discharged on amiodarone  taper and off mexiletine. Patient again tolerated amiodarone  poorly with increased dyspnea (happened last time he was on amiodarone ). This was then stopped and mexiletine was started. He continues only on mexiletine and low dose Toprol  (dose has been limited due to SEs in the past).  Regarding the present episode, he reports that on Friday, January 2 he was taking a shower and then after exiting the shower next thing he knew he was on the floor.  He has no memory of the event.  No prodrome.  He was in significant flank pain after the event but denies any chest pain or heart failure symptoms prior to the event.  He was planning on sitting down to watch the [employer] game after his shower.  Device interrogation shows an episode of VT terminated with an ICD shock that coincides with the syncopal event.  EGM's  consistent with a VT.  He is currently hemodynamically stable.  Electrolytes within normal limits.  Troponin 31-34. Symptoms he is currently reporting is related to his rib pain when he takes deep breaths or moves.   Past Medical History:  Diagnosis Date   Abdominal aortic aneurysm (AAA)    Achilles tendinitis of right lower extremity    AICD (automatic cardioverter/defibrillator) present    Aortic stenosis    moderate severity; AVA 1.01 cm2 (VTI) in April 2025   Arthritis    my whole body (03/19/2018)   Atherosclerosis of coronary artery bypass graft w/o angina pectoris    Atrial fibrillation (HCC)    Barrett's esophagus    CAD (coronary artery disease)    Carotid artery disease    CHF (congestive heart failure) (HCC)    Chronic anticoagulation    PE   Chronic lower back pain    Dyspnea    Dysrhythmia    Essential tremor    GERD (gastroesophageal reflux disease)    High cholesterol    Hyperlipidemia    Hyperplasia, prostate    Hypertension    Hypothyroidism    Myocardial infarction (HCC)    2 in Jan. 2019   Nail dystrophy    OSA on CPAP    uses CPAP   Osteoarthrosis    Pneumonia    now and once before (03/19/2018)   Pulmonary embolism [facility])    April 2012 after CABG   S/P CABG (coronary  artery bypass graft)    Sleep apnea    Spontaneous pneumothorax    left spontaneous pneumothorax/left pleural effusion, s/p chest tube 01/09/19; thoracentesis x2, s/p left VATS/tacl pleurodesis 04/12/19    Past Surgical History:  Procedure Laterality Date   ABDOMINAL AORTIC ENDOVASCULAR STENT GRAFT N/A 08/06/2022   Procedure: ABDOMINAL AORTIC ENDOVASCULAR STENT GRAFT;  Surgeon: Eric Gaile ORN, MD;  Location: [facility] OR;  Service: Vascular;  Laterality: N/A;   ACHILLES TENDON REPAIR Bilateral    2006 & 2004   CARDIAC CATHETERIZATION  11/2010   CARDIOVERSION N/A 08/03/2023   Procedure: CARDIOVERSION (CATH LAB);  Surgeon: Eric Eric RAMAN, MD;  Location: [facility] INVASIVE CV LAB;  Service:  Cardiovascular;  Laterality: N/A;   CORONARY ANGIOGRAPHY N/A 10/22/2018   Procedure: CORONARY ANGIOGRAPHY;  Surgeon: Eric Debby LABOR, MD;  Location: [facility] INVASIVE CV LAB;  Service: Cardiovascular;  Laterality: N/A;   CORONARY ARTERY BYPASS GRAFT  11/2010   CABG X 5   CORONARY PRESSURE/FFR STUDY N/A 10/22/2018   Procedure: INTRAVASCULAR PRESSURE WIRE/FFR STUDY;  Surgeon: Eric Debby LABOR, MD;  Location: [facility] INVASIVE CV LAB;  Service: Cardiovascular;  Laterality: N/A;   ESOPHAGOGASTRODUODENOSCOPY (EGD) WITH PROPOFOL  N/A 12/10/2020   Procedure: ESOPHAGOGASTRODUODENOSCOPY (EGD) WITH PROPOFOL ;  Surgeon: Eric Victory LITTIE DOUGLAS, MD;  Location: [facility] ENDOSCOPY;  Service: Gastroenterology;  Laterality: N/A;   ICD IMPLANT N/A 02/25/2022   Procedure: ICD IMPLANT;  Surgeon: Eric Eric DASEN, MD;  Location: [facility] INVASIVE CV LAB;  Service: Cardiovascular;  Laterality: N/A;   INGUINAL HERNIA REPAIR Left 1980   IR THORACENTESIS ASP PLEURAL SPACE W/IMG GUIDE  02/04/2019   IR THORACENTESIS ASP PLEURAL SPACE W/IMG GUIDE  02/24/2019   KNEE ARTHROSCOPY Left 2006   LEFT HEART CATH AND CORS/GRAFTS ANGIOGRAPHY N/A 10/18/2018   Procedure: LEFT HEART CATH AND CORS/GRAFTS ANGIOGRAPHY;  Surgeon: Eric Dorn PARAS, MD;  Location: [facility] INVASIVE CV LAB;  Service: Cardiovascular;  Laterality: N/A;   LEFT HEART CATH AND CORS/GRAFTS ANGIOGRAPHY N/A 10/21/2018   Procedure: LEFT HEART CATH AND CORS/GRAFTS ANGIOGRAPHY;  Surgeon: Eric Debby LABOR, MD;  Location: [facility] INVASIVE CV LAB;  Service: Cardiovascular;  Laterality: N/A;   PACEMAKER IMPLANT N/A 02/24/2022   Procedure: PACEMAKER IMPLANT;  Surgeon: Eric Elspeth BROCKS, MD;  Location: [facility] INVASIVE CV LAB;  Service: Cardiovascular;  Laterality: N/A;   PENILE PROSTHESIS IMPLANT     PLEURAL BIOPSY Left 04/12/2019   Procedure: Pleural Biopsy;  Surgeon: Eric Dallas NOVAK, MD;  Location: [facility] OR;  Service: Thoracic;  Laterality: Left;   PLEURAL EFFUSION DRAINAGE Left 04/12/2019   Procedure: Drainage Of  Pleural Effusion;  Surgeon: Eric Dallas NOVAK, MD;  Location: [facility] OR;  Service: Thoracic;  Laterality: Left;   RIGHT HEART CATHETERIZATION N/A 11/14/2013   Procedure: RIGHT HEART CATH;  Surgeon: Eric Gordon Rolan, MD;  Location: [facility] CATH LAB;  Service: Cardiovascular;  Laterality: N/A;   RIGHT/LEFT HEART CATH AND CORONARY/GRAFT ANGIOGRAPHY N/A 10/15/2022   Procedure: RIGHT/LEFT HEART CATH AND CORONARY/GRAFT ANGIOGRAPHY;  Surgeon: Verlin Lonni BIRCH, MD;  Location: [facility] INVASIVE CV LAB;  Service: Cardiovascular;  Laterality: N/A;   SHOULDER OPEN ROTATOR CUFF REPAIR Right 2003   TALC  PLEURODESIS Left 04/12/2019   Procedure: TALC  PLEURADESIS;  Surgeon: Eric Dallas NOVAK, MD;  Location: [facility] OR;  Service: Thoracic;  Laterality: Left;   TEE WITHOUT CARDIOVERSION N/A 03/24/2019   Procedure: TRANSESOPHAGEAL ECHOCARDIOGRAM (TEE);  Surgeon: Eric Eric RAMAN, MD;  Location: [facility] ENDOSCOPY;  Service: Cardiovascular;  Laterality: N/A;   TEE WITHOUT CARDIOVERSION  04/12/2019  Procedure: Transesophageal Echocardiogram (Tee);  Surgeon: Eric Dallas NOVAK, MD;  Location: [facility] OR;  Service: Thoracic;;   THORACOSCOPY Left 04/12/2019   VIDEO BRONCHOSCOPY (N/A ) VIDEO ASSISTED THORACOSCOPY (Left Chest) TALC  PLEURADESIS (Left    TRANSCAROTID ARTERY REVASCULARIZATION  Right 09/13/2020   Procedure: RIGHT TRANSCAROTID ARTERY REVASCULARIZATION USING 9mm X 40mm ENROUTE TRANSCAROTID STEND SYSTEM;  Surgeon: Eric Gaile ORN, MD;  Location: [facility] OR;  Service: Vascular;  Laterality: Right;   TRANSCAROTID ARTERY REVASCULARIZATION  Left 02/08/2021   Procedure: LEFT TRANSCAROTID ARTERY REVASCULARIZATION;  Surgeon: Eric Gaile ORN, MD;  Location: [facility] OR;  Service: Vascular;  Laterality: Left;   ULTRASOUND GUIDANCE FOR VASCULAR ACCESS  09/13/2020   Procedure: ULTRASOUND GUIDANCE FOR VASCULAR ACCESS;  Surgeon: Eric Gaile ORN, MD;  Location: [facility] OR;  Service: Vascular;;   ULTRASOUND GUIDANCE FOR VASCULAR ACCESS Bilateral 08/06/2022    Procedure: ULTRASOUND GUIDANCE FOR VASCULAR ACCESS;  Surgeon: Eric Gaile ORN, MD;  Location: [facility] OR;  Service: Vascular;  Laterality: Bilateral;   VIDEO ASSISTED THORACOSCOPY Left 04/12/2019   Procedure: VIDEO ASSISTED THORACOSCOPY;  Surgeon: Eric Dallas NOVAK, MD;  Location: [facility] OR;  Service: Thoracic;  Laterality: Left;   VIDEO BRONCHOSCOPY N/A 04/12/2019   Procedure: VIDEO BRONCHOSCOPY;  Surgeon: Eric Dallas NOVAK, MD;  Location: [facility] OR;  Service: Thoracic;  Laterality: N/A;     Home Medications:  Prior to Admission medications  Medication Sig Start Date End Date Taking? Authorizing Provider  acetaminophen  (TYLENOL ) 500 MG tablet Take 1,000 mg by mouth every 6 (six) hours as needed for mild pain (pain score 1-3) or moderate pain (pain score 4-6).   Yes [provider]  albuterol  (VENTOLIN  HFA) 108 (90 Base) MCG/ACT inhaler Inhale 2 puffs into the lungs every 6 (six) hours as needed for wheezing or shortness of breath. 07/29/24  Yes [provider]  atorvastatin  (LIPITOR ) 80 MG tablet Take 1 tablet (80 mg total) by mouth daily. Patient taking differently: Take 80 mg by mouth at bedtime. 11/01/18  Yes Eric Eric RAMAN, MD  carboxymethylcellulose (REFRESH PLUS) 0.5 % SOLN Place 1 drop into both eyes every 6 (six) hours as needed (dry/watery eyes).   Yes [provider]  cetirizine (ZYRTEC) 10 MG tablet Take 10 mg by mouth daily.   Yes [provider]  Cholecalciferol  (VITAMIN D -3) 25 MCG (1000 UT) CAPS Take 1,000 Units by mouth at bedtime.   Yes [provider]  edoxaban  (SAVAYSA ) 30 MG TABS tablet Take 30 mg by mouth daily. 08/26/23  Yes [provider]  fluticasone (FLONASE) 50 MCG/ACT nasal spray Place 2 sprays into both nostrils daily as needed for allergies or rhinitis.   Yes [provider]  furosemide  (LASIX ) 40 MG tablet Take 40 mg by mouth daily.   Yes [provider]  levothyroxine  (SYNTHROID ) 75 MCG tablet Take 75  mcg by mouth daily before breakfast.   Yes [provider]  lidocaine  (LIDODERM ) 5 % Place 1 patch onto the skin daily. Remove & Discard patch within 12 hours or as directed by MD   Yes [provider]  metoprolol  succinate (TOPROL  XL) 25 MG 24 hr tablet Take 1 tablet (25 mg total) by mouth daily. Patient taking differently: Take 25 mg by mouth at bedtime. 11/28/22  Yes Lesia Ozell Barter, PA-C  mexiletine (MEXITIL ) 150 MG capsule Take 150 mg by mouth 2 (two) times daily.   Yes [provider]  Multiple Vitamin (MULTIVITAMIN WITH MINERALS) TABS tablet Take 1 tablet by mouth in  the morning.   Yes [provider]  pantoprazole  (PROTONIX ) 40 MG tablet Take 40 mg by mouth daily before breakfast.   Yes [provider]  primidone  (MYSOLINE ) 50 MG tablet Take 50 mg by mouth daily.   Yes [provider]  spironolactone  (ALDACTONE ) 25 MG tablet Take 12.5 mg by mouth 2 (two) times daily.   Yes [provider]  traMADol  (ULTRAM ) 50 MG tablet Take 50 mg by mouth every 12 (twelve) hours as needed for moderate pain (pain score 4-6).   Yes [provider]    Scheduled Meds:  atorvastatin   80 mg Oral QHS   edoxaban   30 mg Oral Q24H   furosemide   40 mg Oral Daily   levothyroxine   75 mcg Oral Q0600   lidocaine   1 patch Transdermal Q24H   metoprolol  succinate  25 mg Oral Daily   mexiletine  150 mg Oral BID   oxyCODONE   10 mg Oral Q12H   pantoprazole   40 mg Oral QAC breakfast   spironolactone   12.5 mg Oral BID   Continuous Infusions:  PRN Meds: acetaminophen  **OR** acetaminophen , HYDROcodone -acetaminophen , melatonin, naLOXone  (NARCAN )  injection, ondansetron  (ZOFRAN ) IV  Allergies:   Allergies[1]  Social History:   Social History   Socioeconomic History   Marital status: Married    Spouse name: Not on file   Number of children: 2   Years of education: Not on file   Highest education level: Not on file  Occupational History    Occupation: Investment Banker, [employer]: [employer]  Tobacco Use   Smoking status: Former    Current packs/day: 0.00    Average packs/day: 1 pack/day for 35.0 years (35.0 ttl pk-yrs)    Types: Cigarettes    Start date: 02/17/1949    Quit date: 02/18/1984    Years since quitting: 40.6   Smokeless tobacco: Never   Tobacco comments:    Former smoker 08/17/23  Vaping Use   Vaping status: Never Used  Substance and Sexual Activity   Alcohol  use: Yes    Alcohol /week: 2.0 standard drinks of alcohol     Types: 2 Standard drinks or equivalent per week    Comment: 1-2 glasses of wine weekly 08/17/23   Drug use: Never   Sexual activity: Not on file  Other Topics Concern   Not on file  Social History Narrative   Right handed    Social Drivers of Health   Tobacco Use: Medium Risk (09/24/2024)   Patient History    Smoking Tobacco Use: Former    Smokeless Tobacco Use: Never    Passive Exposure: Not on Actuary Strain: Not on file  Food Insecurity: No Food Insecurity (06/07/2024)   Epic    Worried About Programme Researcher, Broadcasting/film/video in the Last Year: Never true    [employer] of Food in the Last Year: Never true  Transportation Needs: No Transportation Needs (06/07/2024)   Epic    Lack of Transportation (Medical): No    Lack of Transportation (Non-Medical): No  Physical Activity: Not on file  Stress: Not on file  Social Connections: Unknown (06/07/2024)   Social Connection and Isolation Panel    Frequency of Communication with Friends and Family: Not on file    Frequency of Social Gatherings with Friends and Family: Twice a week    Attends Religious Services: Not on Marketing Executive or Organizations: Not on file    Attends Banker Meetings:  Not on file    Marital Status: Not on file  Intimate Partner Violence: Not At Risk (06/07/2024)   Epic    Fear of Current or Ex-Partner: No    Emotionally Abused: No    Physically Abused: No    Sexually Abused: No  Depression  (PHQ2-9): Not on file  Alcohol  Screen: Not on file  Housing: Low Risk (06/07/2024)   Epic    Unable to Pay for Housing in the Last Year: No    Number of Times Moved in the Last Year: 0    Homeless in the Last Year: No  Utilities: Not At Risk (06/07/2024)   Epic    Threatened with loss of utilities: No  Health Literacy: Not on file    Family History:    Family History  Problem Relation Age of Onset   Coronary artery disease Mother    Hypertension Mother    Heart disease Mother      ROS:  Please see the history of present illness.   All other ROS reviewed and negative.     Physical Exam/Data: Vitals:   09/25/24 1715 09/25/24 1753 09/25/24 1958 09/25/24 2357  BP:  (!) 126/51 111/63 (!) 107/56  Pulse:  70 87 80  Resp:  16 17 16   Temp: 98.1 F (36.7 C)  98.8 F (37.1 C) 98.2 F (36.8 C)  TempSrc: Oral  Oral Oral  SpO2:  94% 93% 93%  Weight:      Height:        Intake/Output Summary (Last 24 hours) at 09/26/2024 0136 Last data filed at 09/26/2024 0001 Gross per 24 hour  Intake --  Output 200 ml  Net -200 ml      09/24/2024   11:00 PM 08/29/2024   11:42 AM 07/21/2024    2:44 PM  Last 3 Weights  Weight (lbs) 202 lb 202 lb 203 lb 3.2 oz  Weight (kg) 91.627 kg 91.627 kg 92.171 kg     Body mass index is 25.25 kg/m.  General:  Well nourished, well developed, in no acute distress HEENT: normal Neck: no JVD Vascular: No carotid bruits; Distal pulses 2+ bilaterally Cardiac:  normal S1, S2; RRR; systolic murmur at base Lungs:  clear to auscultation bilaterally, no wheezing, rhonchi or rales  Abd: soft, nontender, no hepatomegaly  Ext: no edema Musculoskeletal:  No deformities, BUE and BLE strength normal and equal Skin: warm and dry  Neuro:  CNs 2-12 intact, no focal abnormalities noted Psych:  Normal affect   EKG:  The EKG was personally reviewed and demonstrates:  AF with bifasicular block Telemetry:  Telemetry was personally reviewed and demonstrates:    Relevant  CV Studies: See above  Laboratory Data: High Sensitivity Troponin:  No results for input(s): TROPONINIHS in the last 720 hours.  Recent Labs  Lab 09/24/24 1518 09/24/24 1932 09/25/24 0520  TRNPT 31* 31* 34*      Chemistry Recent Labs  Lab 09/24/24 1518 09/24/24 1529 09/25/24 0520  NA 138 139 138  K 4.1 4.0 4.8  CL 100 101 101  CO2 27  --  28  GLUCOSE 122* 119* 110*  BUN 29* 30* 27*  CREATININE 1.63* 1.70* 1.53*  CALCIUM  9.7  --  9.5  MG 1.7  --  2.8*  GFRNONAA 40*  --  43*  ANIONGAP 11  --  9    Recent Labs  Lab 09/24/24 1518 09/25/24 0520  PROT 7.2 6.7  ALBUMIN  3.8 3.7  AST 26 33  ALT 12 13  ALKPHOS 114 97  BILITOT 0.5 0.5   Lipids No results for input(s): CHOL, TRIG, HDL, LABVLDL, LDLCALC, CHOLHDL in the last 168 hours.  Hematology Recent Labs  Lab 09/24/24 1518 09/24/24 1529 09/25/24 0520  WBC 10.3  --  8.7  RBC 4.16*  --  3.87*  HGB 12.6* 12.9* 11.8*  HCT 40.0 38.0* 37.5*  MCV 96.2  --  96.9  MCH 30.3  --  30.5  MCHC 31.5  --  31.5  RDW 14.3  --  14.5  PLT 153  --  121*   Thyroid  No results for input(s): TSH, FREET4 in the last 168 hours.  BNP Recent Labs  Lab 09/24/24 1932 09/25/24 0520  PROBNP 2,681.0* 2,721.0*    DDimer No results for input(s): DDIMER in the last 168 hours.  Radiology/Studies:  ECHOCARDIOGRAM COMPLETE Result Date: 09/25/2024    ECHOCARDIOGRAM REPORT   Patient Name:   KNOWLEDGE ESCANDON Date of Exam: 09/25/2024 Medical Rec #:  [id number]           Height:       75.0 in Accession #:    [id number]          Weight:       202.0 lb Date of Birth:  09-12-1931          BSA:          2.204 m Patient Age:    89 years            BP:           111/55 mmHg Patient Gender: M                   HR:           71 bpm. Exam Location:  Inpatient Procedure: 2D Echo, Cardiac Doppler, Color Doppler, Intracardiac Opacification            Agent and PEDOF (Both Spectral and Color Flow Doppler were utilized            during  procedure). Indications:    Syncope R55  History:        Patient has prior history of Echocardiogram examinations, most                 recent 01/12/2024. CAD, Prior CABG, Aortic Valve Disease,                 Signs/Symptoms:Syncope; Risk Factors:Hypertension, Dyslipidemia,                 CKD and Sleep Apnea.  Sonographer:    Koleen Popper RDCS Referring Phys: JUSTIN B HOWERTER  Sonographer Comments: Pt in pain throughout exam due to injury from fall. IMPRESSIONS  1. Left ventricular ejection fraction, by estimation, is 50%. The left ventricle has mildly decreased function. The left ventricle demonstrates global hypokinesis. There is mild asymmetric left ventricular hypertrophy of the basal-septal segment. Left ventricular diastolic parameters are indeterminate.  2. Right ventricular systolic function is normal. The right ventricular size is normal. There is normal pulmonary artery systolic pressure.  3. The mitral valve is degenerative. No evidence of mitral valve regurgitation. No evidence of mitral stenosis. Moderate mitral annular calcification.  4. The aortic valve is tricuspid and is moderately calcified. There is mild aortic regurgitation. There is moderate aortic stenosis with gradients that are likely underestimated in the setting of a low SVI = 31 (AoV Vmax = 2.87 m/s, AoV mn Grad = 18 mmHg,  AVA VTI = 1.26, AVA/BSA = 0.57). Comparison(s): No significant change from prior study. Conclusion(s)/Recommendation(s): No clear cause for syncope identified. Recommend repeat echocardiogram in 12 months for surveillance of aortic stenosis. FINDINGS  Left Ventricle: Left ventricular ejection fraction, by estimation, is 50%. The left ventricle has mildly decreased function. The left ventricle demonstrates global hypokinesis. Definity  contrast agent was given IV to delineate the left ventricular endocardial borders. The left ventricular internal cavity size was normal in size. There is mild asymmetric left ventricular  hypertrophy of the basal-septal segment. Left ventricular diastolic parameters are indeterminate. Right Ventricle: The right ventricular size is normal. No increase in right ventricular wall thickness. Right ventricular systolic function is normal. There is normal pulmonary artery systolic pressure. The tricuspid regurgitant velocity is 2.66 m/s, and  with an assumed right atrial pressure of 3 mmHg, the estimated right ventricular systolic pressure is 31.3 mmHg. Left Atrium: Left atrial size was normal in size. Right Atrium: Right atrial size was normal in size. Pericardium: There is no evidence of pericardial effusion. Mitral Valve: The mitral valve is degenerative in appearance. Moderate mitral annular calcification. No evidence of mitral valve regurgitation. No evidence of mitral valve stenosis. Tricuspid Valve: The tricuspid valve is normal in structure. Tricuspid valve regurgitation is mild . No evidence of tricuspid stenosis. Aortic Valve: The aortic valve is tricuspid and is moderately calcified. There is mild aortic regurgitation. There is moderate aortic stenosis with gradients that are likely underestimated in the setting of a low SVI = 31 (AoV Vmax = 2.87 m/s, AoV mn Grad = 18 mmHg, AVA VTI = 1.26, AVA/BSA = 0.57). Aortic valve mean gradient measures 18.0 mmHg. Aortic valve peak gradient measures 32.9 mmHg. Aortic valve area, by VTI measures 1.26 cm. Pulmonic Valve: The pulmonic valve was normal in structure. Pulmonic valve regurgitation is mild. No evidence of pulmonic stenosis. Aorta: The aortic root and ascending aorta are structurally normal, with no evidence of dilitation. Venous: The inferior vena cava was not well visualized. IAS/Shunts: The interatrial septum was not well visualized. Additional Comments: A device lead is visualized.  LEFT VENTRICLE PLAX 2D LVIDd:         5.70 cm   Diastology LVIDs:         4.10 cm   LV e' medial:    6.25 cm/s LV PW:         1.10 cm   LV E/e' medial:  11.4 LV IVS:         1.30 cm   LV e' lateral:   7.81 cm/s LVOT diam:     2.00 cm   LV E/e' lateral: 9.1 LV SV:         68 LV SV Index:   31 LVOT Area:     3.14 cm  RIGHT VENTRICLE             IVC RV S prime:     11.20 cm/s  IVC diam: 2.20 cm TAPSE (M-mode): 1.7 cm LEFT ATRIUM             Index LA diam:        4.70 cm 2.13 cm/m LA Vol (A2C):   37.6 ml 17.06 ml/m LA Vol (A4C):   46.5 ml 21.10 ml/m LA Biplane Vol: 42.1 ml 19.10 ml/m  AORTIC VALVE AV Area (Vmax):    1.11 cm AV Area (Vmean):   1.13 cm AV Area (VTI):     1.26 cm AV Vmax:  287.00 cm/s AV Vmean:          194.000 cm/s AV VTI:            0.535 m AV Peak Grad:      32.9 mmHg AV Mean Grad:      18.0 mmHg LVOT Vmax:         101.00 cm/s LVOT Vmean:        69.800 cm/s LVOT VTI:          0.215 m LVOT/AV VTI ratio: 0.40  AORTA Ao Root diam: 3.40 cm MITRAL VALVE               TRICUSPID VALVE MV Area (PHT): 3.85 cm    TR Peak grad:   28.3 mmHg MV Decel Time: 197 msec    TR Vmax:        266.00 cm/s MV E velocity: 71.30 cm/s MV A velocity: 88.00 cm/s  SHUNTS MV E/A ratio:  0.81        Systemic VTI:  0.22 m                            Systemic Diam: 2.00 cm Georganna Archer Electronically signed by Georganna Archer Signature Date/Time: 09/25/2024/6:31:29 PM    Final    CT CHEST ABDOMEN PELVIS WO CONTRAST Result Date: 09/24/2024 CLINICAL DATA:  Unwitnessed fall with right rib pain. EXAM: CT CHEST, ABDOMEN AND PELVIS WITHOUT CONTRAST TECHNIQUE: Multidetector CT imaging of the chest, abdomen and pelvis was performed following the standard protocol without IV contrast. RADIATION DOSE REDUCTION: This exam was performed according to the departmental dose-optimization program which includes automated exposure control, adjustment of the mA and/or kV according to patient size and/or use of iterative reconstruction technique. COMPARISON:  CTA chest, abdomen, and pelvis dated 06/06/2024 FINDINGS: CT CHEST FINDINGS Cardiovascular: Left chest wall ICD leads terminate in the right  atrium and ventricle. Normal heart size. No significant pericardial fluid/thickening. Dilated ascending thoracic aortic aneurysm measures 4.0 cm. Coronary artery calcifications. Mitral annular calcification. Mediastinum/Nodes: Imaged thyroid  gland without nodules meeting criteria for imaging follow-up by size. Small hiatal hernia. No pathologically enlarged axillary, supraclavicular, mediastinal, or hilar lymph nodes. Lungs/Pleura: The central airways are patent. Bilateral lower lobe subsegmental atelectasis. Biapical pleural-parenchymal scarring and calcified plaque. 3 mm subpleural right middle lobe nodule (5:103), unchanged. No pneumothorax. Unchanged small left pleural effusion. Musculoskeletal: Subtle cortical angulation of the lateral right fifth, sixth, and seventh ribs. Nondisplaced fractures of the anterolateral right eighth and ninth ribs. Additional nondisplaced fracture of the posterolateral and posterior eighth rib at the costovertebral junction and posteromedial ninth rib. Nondisplaced fracture of the right T8 transverse process. Degenerative changes of the shoulders. Median sternotomy wires are nondisplaced. Subcutaneous soft tissue reticulation along the inferolateral right chest wall and right flank. Multilevel degenerative changes of the thoracic spine. Unchanged mild vertebral body height loss of T9. CT ABDOMEN PELVIS FINDINGS Hepatobiliary: Scattered subcentimeter hypodensities, too small to characterize but likely cysts. Slightly prominent appearance of the caudate lobe lying medial to the IVC. No intra or extrahepatic biliary ductal dilation. Normal gallbladder. Pancreas: No focal lesions or main ductal dilation. Spleen: Normal in size without focal abnormality. Adrenals/Urinary Tract: No adrenal nodules. No suspicious renal lesions by noncontrast technique. Bilateral simple cysts, unchanged. No specific follow-up imaging recommended. No hydronephrosis or calculi. Trabeculated bladder contour  with multiple small diverticula. Stomach/Bowel: Normal appearance of the stomach. 1.4 x 1.2 cm lipoma in the duodenal bulb (3:63).  No evidence of bowel wall thickening, distention, or inflammatory changes. Colonic diverticulosis without acute diverticulitis. Normal appendix extends into the right inguinal hernia. Vascular/Lymphatic: Aortic atherosclerosis. Infrarenal abdominal aortic aneurysm status post repair with aorto bi-iliac stent graft. Excluded aneurysm sac measures 5.0 x 4.9 cm, unchanged. No enlarged abdominal or pelvic lymph nodes. Reproductive: Prostate is unremarkable. Penile prosthesis. Deflated reservoir in the midline anterior pelvis. Other: No free fluid, fluid collection, or free air. Musculoskeletal: No acute or abnormal lytic or blastic osseous findings. Small fat-containing bilateral inguinal hernias. Multilevel degenerative changes of the lumbar spine. IMPRESSION: 1. Nondisplaced fractures of the right eighth and ninth ribs, as described. 2. Nondisplaced fracture of the right T8 transverse process. 3. Subtle cortical angulation of the lateral right fifth, sixth, and seventh ribs, which may represent nondisplaced fractures. 4. Subcutaneous soft tissue contusion along the inferolateral right chest wall and right flank. 5. Unchanged small left pleural effusion. 6. No acute traumatic injury in the abdomen or pelvis. 7.  Aortic Atherosclerosis (ICD10-I70.0). Electronically Signed   By: Limin  Xu M.D.   On: 09/24/2024 18:00   CT CERVICAL SPINE WO CONTRAST Result Date: 09/24/2024 EXAM: CT CERVICAL SPINE WITHOUT CONTRAST 09/24/2024 05:14:41 PM TECHNIQUE: CT of the cervical spine was performed without the administration of intravenous contrast. Multiplanar reformatted images are provided for review. Automated exposure control, iterative reconstruction, and/or weight based adjustment of the mA/kV was utilized to reduce the radiation dose to as low as reasonably achievable. COMPARISON: CT of the  cervical spine 12/01/2022. CLINICAL HISTORY: Polytrauma, blunt. Fall in shower yesterday. FINDINGS: BONES AND ALIGNMENT: No acute fracture or traumatic malalignment. DEGENERATIVE CHANGES: Slight degenerative anterolisthesis at C4-C5 is stable. Uncovertebral and facet spurring contribute to moderate foraminal narrowing bilaterally at C3-C4 and C4-C5. Moderate left foraminal stenosis is present at C5-C6 and C6-C7. SOFT TISSUES: No prevertebral soft tissue swelling. Atherosclerotic changes are present at the aortic arch and great vessel origins. Bilateral carotid bifurcation stents are patent. IMPRESSION: 1. No acute cervical spine fracture or dislocation. Electronically signed by: Lonni Necessary MD 09/24/2024 05:28 PM EST RP Workstation: [id number]   CT HEAD WO CONTRAST Result Date: 09/24/2024 EXAM: CT HEAD WITHOUT CONTRAST 09/24/2024 05:14:41 PM TECHNIQUE: CT of the head was performed without the administration of intravenous contrast. Automated exposure control, iterative reconstruction, and/or weight based adjustment of the mA/kV was utilized to reduce the radiation dose to as low as reasonably achievable. COMPARISON: CT head without contrast 03/23/2023. CLINICAL HISTORY: Head trauma, moderate-severe. f aorta all in shower. FINDINGS: BRAIN AND VENTRICLES: No acute hemorrhage. No evidence of acute infarct. No hydrocephalus. No extra-axial collection. No mass effect or midline shift. Age-related atrophy. ORBITS: No acute abnormality. SINUSES: Partial opacification of the lower left mastoid air cells. Mucosal thickening of the right maxillary sinus. SOFT TISSUES AND SKULL: No acute soft tissue abnormality. No skull fracture. Atherosclerosis of skull base vasculature without hyperdense vessel or abnormal calcification. IMPRESSION: 1. No acute intracranial abnormality or evidence of acute trauma. Electronically signed by: Lonni Necessary MD 09/24/2024 05:25 PM EST RP Workstation: [id number]   DG Elbow  Complete Right Result Date: 09/24/2024 CLINICAL DATA:  Blunt trauma EXAM: RIGHT ELBOW - COMPLETE 3+ VIEW COMPARISON:  None Available. FINDINGS: There is no evidence of fracture, dislocation, or joint effusion. There is no evidence of arthropathy or other focal bone abnormality. Soft tissues are unremarkable. IMPRESSION: Negative. Electronically Signed   By: Lynwood Landy Raddle M.D.   On: 09/24/2024 16:06   DG Chest Port 1 View Result Date:  09/24/2024 CLINICAL DATA:  Blunt trauma EXAM: PORTABLE CHEST 1 VIEW COMPARISON:  June 06, 2024 FINDINGS: Stable cardiomediastinal silhouette. Left-sided defibrillator is noted. Status post coronary bypass graft. Lungs are clear. Degenerative changes seen involving the right glenohumeral joint. IMPRESSION: No active disease. Electronically Signed   By: Lynwood Landy Raddle M.D.   On: 09/24/2024 16:04   DG Pelvis Portable Result Date: 09/24/2024 CLINICAL DATA:  Blunt trauma EXAM: PORTABLE PELVIS 1-2 VIEWS COMPARISON:  None Available. FINDINGS: There is no evidence of pelvic fracture or diastasis. No pelvic bone lesions are seen. IMPRESSION: Negative. Electronically Signed   By: Lynwood Landy Raddle M.D.   On: 09/24/2024 16:01   DG Knee Right Port Result Date: 09/24/2024 CLINICAL DATA:  Blunt trauma EXAM: PORTABLE RIGHT KNEE - 1-2 VIEW COMPARISON:  None Available. FINDINGS: No evidence of fracture, dislocation, or joint effusion. Moderate narrowing of medial joint space is noted. Soft tissues are unremarkable. IMPRESSION: Moderate degenerative joint disease is noted medially. No acute abnormality seen. Electronically Signed   By: Lynwood Landy Raddle M.D.   On: 09/24/2024 16:00     Assessment and Plan: Syncope with VT Patient's syncopal event corresponds to inappropriate ICD shock for VT. No obvious triggers at this point.  He denies any chest pain prior to the event or since the event and his troponin is unimpressive and so overall suspicion for ischemia is low in this case.   Electrolytes at presentation were within normal limits.  He is euvolemic on exam and denies any heart failure symptomatology.  No unusual circumstances surrounding the shock beyond taking a hot shower.  Commend the following: - formal device interrogation in AM - will discuss pharmacotherapy options with EP. Options are limited given prior intolerances (see above) - consider ischemic evaluation while in house - monitor on tele - keep K>4, Mg>2   For questions or updates, please contact [facility] Please consult [email or web address] for contact info under    Signed, Dorn CHRISTELLA Flemings, MD  09/26/2024 1:36 AM      [1]  Allergies Allergen Reactions   Ancef [Cefazolin Sodium] Hives   Jardiance [Empagliflozin] Other (See Comments)    Dizziness   Vancomycin  Rash   "

## 2024-09-26 NOTE — Telephone Encounter (Signed)
 BIOTRONIK ICD ALERT:   VT w/ 1 Shock converting back in to sinus rhythm. Patient currently admitted to hospital. Suffered a syncopal event on 1/2 at time of shock, went to ER on 1/3 w/ R sided chest pain.    Forwarding to Daphne Barrack, NP who is rounding in the hospital this week.

## 2024-09-26 NOTE — TOC Initial Note (Signed)
 Transition of Care [facility]) - Initial/Assessment Note    Patient Details  Name: Eric Gordon MRN: [id number] Date of Birth: July 05, 1931  Transition of Care [facility] CM/SW Contact:    Andrez JULIANNA George, RN Phone Number: 09/26/2024, 2:22 PM  Clinical Narrative:                 Eric Gordon is a 89 y.o. male with medical history significant for moderately severe aortic stenosis, paroxysmal atrial fibrillation chronically anticoagulated on Edoxaban , paroxysmal ventricular tachycardia status post AICD placement in June 2023, chronic systolic/diastolic heart failure, coronary disease status post CABG in 2012, essential tremor, CKD 3B with baseline creatinine 1.5-2.0, spontaneous pneumothorax in 2020 status post chest tube in April 2020, VATS/talc  pleurodesis in July 2020, who is admitted to [facility] on 09/24/2024 with single episode of syncope without prodrome after presenting from home to [facility] ED complaining of right-sided chest pain after syncopal episode.   Pt is from home with his spouse.  Pt manages his own medications and denies any issues.  Spouse provides needed transportation.  Home health arranged with [facility]. Pt has used them in the past and asked to use them again. Information on the AVS. [facility] will contact him for the first home visit.  IP Care management following.  Expected Discharge Plan: Home w Home Health Services Barriers to Discharge: Continued Medical Work up   Patient Goals and CMS Choice   CMS Medicare.gov Compare Post Acute Care list provided to:: Patient Choice offered to / list presented to : Patient      Expected Discharge Plan and Services   Discharge Planning Services: CM Consult Post Acute Care Choice: Home Health Living arrangements for the past 2 months: Single Family Home                           HH Arranged: PT, OT, Nurse's Aide HH Agency: [facility] ([facility]) Date HH Agency Contacted: 09/26/24   Representative  spoke with at [facility] Agency: [facility]  Prior Living Arrangements/Services Living arrangements for the past 2 months: Single Family Home Lives with:: Spouse Patient language and need for interpreter reviewed:: Yes Do you feel safe going back to the place where you live?: Yes        Care giver support system in place?: Yes (comment) Current home services: DME (cane/ walker/ shower seat) Criminal Activity/Legal Involvement Pertinent to Current Situation/Hospitalization: No - Comment as needed  Activities of Daily Living      Permission Sought/Granted                  Emotional Assessment Appearance:: Appears stated age Attitude/Demeanor/Rapport: Engaged Affect (typically observed): Accepting Orientation: : Oriented to Self, Oriented to Place, Oriented to  Time, Oriented to Situation   Psych Involvement: No (comment)  Admission diagnosis:  Syncope and collapse [R55] Syncope [R55] Closed fracture of multiple ribs of right side, initial encounter [S22.41XA] Patient Active Problem List   Diagnosis Date Noted   Multiple rib fractures 09/24/2024   Elevated troponin 09/24/2024   Fracture of thoracic transverse process (HCC) 09/24/2024   Acute hypoxic respiratory failure (HCC) 06/07/2024   Hypercoagulable state due to persistent atrial fibrillation (HCC) 07/30/2023   Syncope, cardiogenic 12/03/2022   T9 vertebral fracture (HCC) 12/02/2022   Ischemic cardiomyopathy 10/16/2022   Chronic combined systolic and diastolic heart failure (HCC) 10/16/2022   Paroxysmal ventricular tachycardia (HCC) 10/16/2022   Orthostatic hypotension 10/16/2022   Pseudoaneurysm 10/16/2022  Unstable angina (HCC) 10/16/2022   Chest pain 10/14/2022   AAA (abdominal aortic aneurysm) 08/06/2022   Hypomagnesemia 02/25/2022   Chest wall contusion 02/25/2022   Laceration of head 02/25/2022   AKI (acute kidney injury) 02/23/2022   HLD (hyperlipidemia) 02/23/2022   PVD (peripheral vascular disease) carotid  02/23/2022   Tachy-brady syndrome (HCC) 02/23/2022   Syncope 02/22/2022   Dizzy 01/16/2022   Dizziness 01/16/2022   Dyspnea 01/01/2022   Pseudoaneurysm of left ventricle of heart 12/31/2021   Acquired aneurysm of left ventricle of heart 12/31/2021   Carotid stenosis 02/08/2021   Status post carotid endarterectomy 09/13/2020   Carotid artery stenosis, symptomatic, right 09/13/2020   Tear of lateral meniscus of knee 05/04/2020   Osteoarthritis of left knee 04/05/2020   Pain in left knee 04/03/2020   Pleural effusion 04/12/2019   Pneumothorax on left 01/10/2019   Pneumothorax, left 01/09/2019   CKD stage 3b, GFR 30-44 ml/min (HCC) 01/09/2019   Acute on chronic combined systolic and diastolic CHF (congestive heart failure) (HCC) 01/09/2019   Acquired hypothyroidism 01/09/2019   Pleural effusion on left 01/09/2019   NSTEMI (non-ST elevated myocardial infarction) (HCC) 10/21/2018   Acute diastolic CHF (congestive heart failure) (HCC)    Non-ST elevation (NSTEMI) myocardial infarction (HCC) 10/17/2018   CAP (community acquired pneumonia) 03/19/2018   Hoarseness 12/15/2013   Moderate aortic stenosis 11/03/2012   Persistent atrial fibrillation (HCC) 04/02/2011   Carotid stenosis, asymptomatic 04/02/2011   CAD s/p CABG 3/12  01/10/2011   Paroxysmal atrial fibrillation (HCC) 01/10/2011   Abdominal aortic aneurysm 11/06/2010   CHEST TIGHTNESS-PRESSURE-OTHER 11/01/2010   Asthma, mild intermittent 04/10/2010   DYSPNEA 02/26/2010   OBSTRUCTIVE SLEEP APNEA 08/22/2008   Essential hypertension 08/22/2008   BARRETTS ESOPHAGUS 08/18/2008   HIATAL HERNIA 08/18/2008   DIVERTICULOSIS, COLON 08/18/2008   History of colonic polyps 08/18/2008   PCP:  [name], MD Pharmacy:   [facility] [address] [place] - [address] [place] [address] Phone: [phone] Fax: [phone]  [facility] - [place], [place] - [address] [place] [address] Phone: [phone] Fax: [phone]     Social Drivers of Health (SDOH) Social History: SDOH Screenings   Food Insecurity: No Food Insecurity (06/07/2024)  Housing: Low Risk (06/07/2024)  Transportation Needs: No Transportation Needs (06/07/2024)  Utilities: Not At Risk (06/07/2024)  Social Connections: Unknown (06/07/2024)  Tobacco Use: Medium Risk (09/24/2024)   SDOH Interventions:     Readmission Risk Interventions     No data to display

## 2024-09-26 NOTE — Consult Note (Addendum)
 "   ELECTROPHYSIOLOGY CONSULT NOTE    Patient ID: Eric Gordon MRN: [id number], DOB/AGE: September 20, 1931 89 y.o.  Admit date: 09/24/2024 Date of Consult: 09/26/2024  Primary Physician: [name], MD Primary Cardiologist: None  Electrophysiologist: Dr. Inocencio   Referring Provider: Dr. Rosario  Patient Profile: Eric Gordon is a 89 y.o. male with a history of HFrEF, VT s/p ICD, AF, CAD s/p CABG, OSA who is being seen today for the evaluation of VT s/p ICD shock at the request of Dr. Rosario.  HPI:  Eric Gordon is a 89 y.o. male who presented to [facility] ER on 09/24/24 with reports of passing out in the shower.    He was seen in EP Clinic on 08/29/24 after ATP on 08/21/24 for VT. Driving restrictions were placed. Of note, his medications had been limited due to primidone  (Eliquis  and AAD options).  On Edoxaban  for AF.   The patient reported on 09/23/24 that he showered and woke up on the floor of the shower.  He remembers being dizzy before the episode The next thing he knew he was on the floor. He and his wife has a difficult time getting him out of the floor.  She wanted to call the Fire Dept but he refused and somehow managed to get himself out of the floor. He had no memory of the event. He had significant right chest and flank pain after the event. He believes he hit his head.  Device interrogation showed an event on 09/24/23 for VT/VF event.  EGM review shows an irregular rhythm, then PVC that initiates VT with cycle length of 210-250 ms s/p 40j HV therapy with Type II break.  Initial labs notable for K 4.1, Cr 1.63, troponin 31 (subsequent 31), magnesium  1.7, WBC 8.7, Hgb 11.8, platelets 121.  Electrolytes were replaced. The patient was admitted for further evaluation by TRH. He had multiple images to rule out fracture > arm, knee, pelvis and CT head / cervical spine which were negative.  CT of the chest/abd/pelvis showed non-displaced 8th/9th rib fractures, non-displaced right T8  transverse process fracture, possible fracture of 5th-7th ribs with subtle cortical angulation, subcutaneous soft tissue contusion on the right chest wall / right flank.    He reports right rib pain, right flank pain & muscle spasms.  He has noted episodes of dizziness for some time.    He denies palpitations, dyspnea, PND, orthopnea, nausea, vomiting, dizziness, syncope, edema, weight gain, or early satiety.   Labs Potassium4.2 (01/05 0246) Magnesium   1.8 (01/05 0246) Creatinine, ser  1.70* (01/05 0246) PLT  136* (01/05 0246) HGB  11.2* (01/05 0246) WBC 8.1 (01/05 0246)  .    Past Medical History:  Diagnosis Date   Abdominal aortic aneurysm (AAA)    Achilles tendinitis of right lower extremity    AICD (automatic cardioverter/defibrillator) present    Aortic stenosis    moderate severity; AVA 1.01 cm2 (VTI) in April 2025   Arthritis    my whole body (03/19/2018)   Atherosclerosis of coronary artery bypass graft w/o angina pectoris    Atrial fibrillation (HCC)    Barrett's esophagus    CAD (coronary artery disease)    Carotid artery disease    CHF (congestive heart failure) (HCC)    Chronic anticoagulation    PE   Chronic lower back pain    Dyspnea    Dysrhythmia    Essential tremor    GERD (gastroesophageal reflux disease)    High cholesterol  Hyperlipidemia    Hyperplasia, prostate    Hypertension    Hypothyroidism    Myocardial infarction [facility])    2 in Jan. 2019   Nail dystrophy    OSA on CPAP    uses CPAP   Osteoarthrosis    Pneumonia    now and once before (03/19/2018)   Pulmonary embolism [facility])    April 2012 after CABG   S/P CABG (coronary artery bypass graft)    Sleep apnea    Spontaneous pneumothorax    left spontaneous pneumothorax/left pleural effusion, s/p chest tube 01/09/19; thoracentesis x2, s/p left VATS/tacl pleurodesis 04/12/19     Surgical History:  Past Surgical History:  Procedure Laterality Date   ABDOMINAL AORTIC ENDOVASCULAR STENT  GRAFT N/A 08/06/2022   Procedure: ABDOMINAL AORTIC ENDOVASCULAR STENT GRAFT;  Surgeon: Serene Gaile ORN, MD;  Location: [facility] OR;  Service: Vascular;  Laterality: N/A;   ACHILLES TENDON REPAIR Bilateral    2006 & 2004   CARDIAC CATHETERIZATION  11/2010   CARDIOVERSION N/A 08/03/2023   Procedure: CARDIOVERSION (CATH LAB);  Surgeon: Rolan Ezra RAMAN, MD;  Location: [facility] INVASIVE CV LAB;  Service: Cardiovascular;  Laterality: N/A;   CORONARY ANGIOGRAPHY N/A 10/22/2018   Procedure: CORONARY ANGIOGRAPHY;  Surgeon: Burnard Debby LABOR, MD;  Location: [facility] INVASIVE CV LAB;  Service: Cardiovascular;  Laterality: N/A;   CORONARY ARTERY BYPASS GRAFT  11/2010   CABG X 5   CORONARY PRESSURE/FFR STUDY N/A 10/22/2018   Procedure: INTRAVASCULAR PRESSURE WIRE/FFR STUDY;  Surgeon: Burnard Debby LABOR, MD;  Location: [facility] INVASIVE CV LAB;  Service: Cardiovascular;  Laterality: N/A;   ESOPHAGOGASTRODUODENOSCOPY (EGD) WITH PROPOFOL  N/A 12/10/2020   Procedure: ESOPHAGOGASTRODUODENOSCOPY (EGD) WITH PROPOFOL ;  Surgeon: Legrand Victory LITTIE DOUGLAS, MD;  Location: [facility] ENDOSCOPY;  Service: Gastroenterology;  Laterality: N/A;   ICD IMPLANT N/A 02/25/2022   Procedure: ICD IMPLANT;  Surgeon: Cindie Ole DASEN, MD;  Location: [facility] INVASIVE CV LAB;  Service: Cardiovascular;  Laterality: N/A;   INGUINAL HERNIA REPAIR Left 1980   IR THORACENTESIS ASP PLEURAL SPACE W/IMG GUIDE  02/04/2019   IR THORACENTESIS ASP PLEURAL SPACE W/IMG GUIDE  02/24/2019   KNEE ARTHROSCOPY Left 2006   LEFT HEART CATH AND CORS/GRAFTS ANGIOGRAPHY N/A 10/18/2018   Procedure: LEFT HEART CATH AND CORS/GRAFTS ANGIOGRAPHY;  Surgeon: Court Dorn PARAS, MD;  Location: [facility] INVASIVE CV LAB;  Service: Cardiovascular;  Laterality: N/A;   LEFT HEART CATH AND CORS/GRAFTS ANGIOGRAPHY N/A 10/21/2018   Procedure: LEFT HEART CATH AND CORS/GRAFTS ANGIOGRAPHY;  Surgeon: Burnard Debby LABOR, MD;  Location: [facility] INVASIVE CV LAB;  Service: Cardiovascular;  Laterality: N/A;   PACEMAKER IMPLANT N/A  02/24/2022   Procedure: PACEMAKER IMPLANT;  Surgeon: Fernande Elspeth BROCKS, MD;  Location: [facility] INVASIVE CV LAB;  Service: Cardiovascular;  Laterality: N/A;   PENILE PROSTHESIS IMPLANT     PLEURAL BIOPSY Left 04/12/2019   Procedure: Pleural Biopsy;  Surgeon: Army Dallas NOVAK, MD;  Location: [facility] OR;  Service: Thoracic;  Laterality: Left;   PLEURAL EFFUSION DRAINAGE Left 04/12/2019   Procedure: Drainage Of Pleural Effusion;  Surgeon: Army Dallas NOVAK, MD;  Location: [facility] OR;  Service: Thoracic;  Laterality: Left;   RIGHT HEART CATHETERIZATION N/A 11/14/2013   Procedure: RIGHT HEART CATH;  Surgeon: Ezra RAMAN Rolan, MD;  Location: [facility] CATH LAB;  Service: Cardiovascular;  Laterality: N/A;   RIGHT/LEFT HEART CATH AND CORONARY/GRAFT ANGIOGRAPHY N/A 10/15/2022   Procedure: RIGHT/LEFT HEART CATH AND CORONARY/GRAFT ANGIOGRAPHY;  Surgeon: Verlin Lonni BIRCH, MD;  Location: [facility] INVASIVE CV LAB;  Service: Cardiovascular;  Laterality: N/A;   SHOULDER OPEN ROTATOR CUFF REPAIR Right 2003   TALC  PLEURODESIS Left 04/12/2019   Procedure: TALC  PLEURADESIS;  Surgeon: Army Dallas NOVAK, MD;  Location: [facility] OR;  Service: Thoracic;  Laterality: Left;   TEE WITHOUT CARDIOVERSION N/A 03/24/2019   Procedure: TRANSESOPHAGEAL ECHOCARDIOGRAM (TEE);  Surgeon: Rolan Ezra RAMAN, MD;  Location: [facility] ENDOSCOPY;  Service: Cardiovascular;  Laterality: N/A;   TEE WITHOUT CARDIOVERSION  04/12/2019   Procedure: Transesophageal Echocardiogram (Tee);  Surgeon: Army Dallas NOVAK, MD;  Location: [facility] OR;  Service: Thoracic;;   THORACOSCOPY Left 04/12/2019   VIDEO BRONCHOSCOPY (N/A ) VIDEO ASSISTED THORACOSCOPY (Left Chest) TALC  PLEURADESIS (Left    TRANSCAROTID ARTERY REVASCULARIZATION  Right 09/13/2020   Procedure: RIGHT TRANSCAROTID ARTERY REVASCULARIZATION USING 9mm X 40mm ENROUTE TRANSCAROTID STEND SYSTEM;  Surgeon: Serene Gaile ORN, MD;  Location: [facility] OR;  Service: Vascular;  Laterality: Right;   TRANSCAROTID ARTERY REVASCULARIZATION  Left  02/08/2021   Procedure: LEFT TRANSCAROTID ARTERY REVASCULARIZATION;  Surgeon: Serene Gaile ORN, MD;  Location: [facility] OR;  Service: Vascular;  Laterality: Left;   ULTRASOUND GUIDANCE FOR VASCULAR ACCESS  09/13/2020   Procedure: ULTRASOUND GUIDANCE FOR VASCULAR ACCESS;  Surgeon: Serene Gaile ORN, MD;  Location: [facility] OR;  Service: Vascular;;   ULTRASOUND GUIDANCE FOR VASCULAR ACCESS Bilateral 08/06/2022   Procedure: ULTRASOUND GUIDANCE FOR VASCULAR ACCESS;  Surgeon: Serene Gaile ORN, MD;  Location: [facility] OR;  Service: Vascular;  Laterality: Bilateral;   VIDEO ASSISTED THORACOSCOPY Left 04/12/2019   Procedure: VIDEO ASSISTED THORACOSCOPY;  Surgeon: Army Dallas NOVAK, MD;  Location: [facility] OR;  Service: Thoracic;  Laterality: Left;   VIDEO BRONCHOSCOPY N/A 04/12/2019   Procedure: VIDEO BRONCHOSCOPY;  Surgeon: Army Dallas NOVAK, MD;  Location: [facility] OR;  Service: Thoracic;  Laterality: N/A;     Medications Prior to Admission  Medication Sig Dispense Refill Last Dose/Taking   acetaminophen  (TYLENOL ) 500 MG tablet Take 1,000 mg by mouth every 6 (six) hours as needed for mild pain (pain score 1-3) or moderate pain (pain score 4-6).   09/23/2024   albuterol  (VENTOLIN  HFA) 108 (90 Base) MCG/ACT inhaler Inhale 2 puffs into the lungs every 6 (six) hours as needed for wheezing or shortness of breath.   09/23/2024   atorvastatin  (LIPITOR ) 80 MG tablet Take 1 tablet (80 mg total) by mouth daily. (Patient taking differently: Take 80 mg by mouth at bedtime.) 30 tablet 6 09/23/2024   carboxymethylcellulose (REFRESH PLUS) 0.5 % SOLN Place 1 drop into both eyes every 6 (six) hours as needed (dry/watery eyes).   Unknown   cetirizine (ZYRTEC) 10 MG tablet Take 10 mg by mouth daily.   09/24/2024   Cholecalciferol  (VITAMIN D -3) 25 MCG (1000 UT) CAPS Take 1,000 Units by mouth at bedtime.   09/24/2024   edoxaban  (SAVAYSA ) 30 MG TABS tablet Take 30 mg by mouth daily.   09/24/2024 at  5:00 AM   fluticasone (FLONASE) 50 MCG/ACT nasal spray Place 2  sprays into both nostrils daily as needed for allergies or rhinitis.   Past Month   furosemide  (LASIX ) 40 MG tablet Take 40 mg by mouth daily.   09/24/2024   levothyroxine  (SYNTHROID ) 75 MCG tablet Take 75 mcg by mouth daily before breakfast.   09/24/2024   lidocaine  (LIDODERM ) 5 % Place 1 patch onto the skin daily. Remove & Discard patch within 12 hours or as directed by MD   09/23/2024   metoprolol  succinate (TOPROL  XL) 25 MG 24 hr tablet Take 1 tablet (25  mg total) by mouth daily. (Patient taking differently: Take 25 mg by mouth at bedtime.)   09/23/2024   mexiletine (MEXITIL ) 150 MG capsule Take 150 mg by mouth 2 (two) times daily.   09/24/2024   Multiple Vitamin (MULTIVITAMIN WITH MINERALS) TABS tablet Take 1 tablet by mouth in the morning.   09/24/2024   pantoprazole  (PROTONIX ) 40 MG tablet Take 40 mg by mouth daily before breakfast.   09/24/2024   primidone  (MYSOLINE ) 50 MG tablet Take 50 mg by mouth daily.   09/24/2024   spironolactone  (ALDACTONE ) 25 MG tablet Take 12.5 mg by mouth 2 (two) times daily.   09/24/2024   traMADol  (ULTRAM ) 50 MG tablet Take 50 mg by mouth every 12 (twelve) hours as needed for moderate pain (pain score 4-6).   09/24/2024    Inpatient Medications:   atorvastatin   80 mg Oral QHS   edoxaban   30 mg Oral Q24H   furosemide   40 mg Oral Daily   levothyroxine   75 mcg Oral Q0600   lidocaine   1 patch Transdermal Q24H   metoprolol  succinate  25 mg Oral Daily   mexiletine  150 mg Oral BID   oxyCODONE   10 mg Oral Q12H   pantoprazole   40 mg Oral QAC breakfast   spironolactone   12.5 mg Oral BID    Allergies: Allergies[1]  Family History  Problem Relation Age of Onset   Coronary artery disease Mother    Hypertension Mother    Heart disease Mother      Physical Exam: Vitals:   09/25/24 2357 09/26/24 0332 09/26/24 0622 09/26/24 0723  BP: (!) 107/56 (!) 118/55  (!) 127/55  Pulse: 80 76  77  Resp: 16 19  16   Temp: 98.2 F (36.8 C) 98.5 F (36.9 C)  98.1 F (36.7 C)  TempSrc:  Oral Oral  Oral  SpO2: 93% 92%  92%  Weight:   93.7 kg   Height:        GEN- NAD, A&O x 3, normal affect HEENT: Normocephalic, atraumatic Lungs- CTAB, Normal effort.  Heart- Regular rate and rhythm, No M/G/R.  GI- Soft, NT, ND.  Extremities- No clubbing, cyanosis. Trace BLE edema   Radiology/Studies: ECHOCARDIOGRAM COMPLETE Result Date: 09/25/2024    ECHOCARDIOGRAM REPORT   Patient Name:   Eric Gordon Date of Exam: 09/25/2024 Medical Rec #:  [id number]           Height:       75.0 in Accession #:    [id number]          Weight:       202.0 lb Date of Birth:  1930/11/27          BSA:          2.204 m Patient Age:    89 years            BP:           111/55 mmHg Patient Gender: M                   HR:           71 bpm. Exam Location:  Inpatient Procedure: 2D Echo, Cardiac Doppler, Color Doppler, Intracardiac Opacification            Agent and PEDOF (Both Spectral and Color Flow Doppler were utilized            during procedure). Indications:    Syncope R55  History:  Patient has prior history of Echocardiogram examinations, most                 recent 01/12/2024. CAD, Prior CABG, Aortic Valve Disease,                 Signs/Symptoms:Syncope; Risk Factors:Hypertension, Dyslipidemia,                 CKD and Sleep Apnea.  Sonographer:    Koleen Popper RDCS Referring Phys: JUSTIN B HOWERTER  Sonographer Comments: Pt in pain throughout exam due to injury from fall. IMPRESSIONS  1. Left ventricular ejection fraction, by estimation, is 50%. The left ventricle has mildly decreased function. The left ventricle demonstrates global hypokinesis. There is mild asymmetric left ventricular hypertrophy of the basal-septal segment. Left ventricular diastolic parameters are indeterminate.  2. Right ventricular systolic function is normal. The right ventricular size is normal. There is normal pulmonary artery systolic pressure.  3. The mitral valve is degenerative. No evidence of mitral valve regurgitation. No  evidence of mitral stenosis. Moderate mitral annular calcification.  4. The aortic valve is tricuspid and is moderately calcified. There is mild aortic regurgitation. There is moderate aortic stenosis with gradients that are likely underestimated in the setting of a low SVI = 31 (AoV Vmax = 2.87 m/s, AoV mn Grad = 18 mmHg, AVA VTI = 1.26, AVA/BSA = 0.57). Comparison(s): No significant change from prior study. Conclusion(s)/Recommendation(s): No clear cause for syncope identified. Recommend repeat echocardiogram in 12 months for surveillance of aortic stenosis. FINDINGS  Left Ventricle: Left ventricular ejection fraction, by estimation, is 50%. The left ventricle has mildly decreased function. The left ventricle demonstrates global hypokinesis. Definity  contrast agent was given IV to delineate the left ventricular endocardial borders. The left ventricular internal cavity size was normal in size. There is mild asymmetric left ventricular hypertrophy of the basal-septal segment. Left ventricular diastolic parameters are indeterminate. Right Ventricle: The right ventricular size is normal. No increase in right ventricular wall thickness. Right ventricular systolic function is normal. There is normal pulmonary artery systolic pressure. The tricuspid regurgitant velocity is 2.66 m/s, and  with an assumed right atrial pressure of 3 mmHg, the estimated right ventricular systolic pressure is 31.3 mmHg. Left Atrium: Left atrial size was normal in size. Right Atrium: Right atrial size was normal in size. Pericardium: There is no evidence of pericardial effusion. Mitral Valve: The mitral valve is degenerative in appearance. Moderate mitral annular calcification. No evidence of mitral valve regurgitation. No evidence of mitral valve stenosis. Tricuspid Valve: The tricuspid valve is normal in structure. Tricuspid valve regurgitation is mild . No evidence of tricuspid stenosis. Aortic Valve: The aortic valve is tricuspid and is  moderately calcified. There is mild aortic regurgitation. There is moderate aortic stenosis with gradients that are likely underestimated in the setting of a low SVI = 31 (AoV Vmax = 2.87 m/s, AoV mn Grad = 18 mmHg, AVA VTI = 1.26, AVA/BSA = 0.57). Aortic valve mean gradient measures 18.0 mmHg. Aortic valve peak gradient measures 32.9 mmHg. Aortic valve area, by VTI measures 1.26 cm. Pulmonic Valve: The pulmonic valve was normal in structure. Pulmonic valve regurgitation is mild. No evidence of pulmonic stenosis. Aorta: The aortic root and ascending aorta are structurally normal, with no evidence of dilitation. Venous: The inferior vena cava was not well visualized. IAS/Shunts: The interatrial septum was not well visualized. Additional Comments: A device lead is visualized.  LEFT VENTRICLE PLAX 2D LVIDd:  5.70 cm   Diastology LVIDs:         4.10 cm   LV e' medial:    6.25 cm/s LV PW:         1.10 cm   LV E/e' medial:  11.4 LV IVS:        1.30 cm   LV e' lateral:   7.81 cm/s LVOT diam:     2.00 cm   LV E/e' lateral: 9.1 LV SV:         68 LV SV Index:   31 LVOT Area:     3.14 cm  RIGHT VENTRICLE             IVC RV S prime:     11.20 cm/s  IVC diam: 2.20 cm TAPSE (M-mode): 1.7 cm LEFT ATRIUM             Index LA diam:        4.70 cm 2.13 cm/m LA Vol (A2C):   37.6 ml 17.06 ml/m LA Vol (A4C):   46.5 ml 21.10 ml/m LA Biplane Vol: 42.1 ml 19.10 ml/m  AORTIC VALVE AV Area (Vmax):    1.11 cm AV Area (Vmean):   1.13 cm AV Area (VTI):     1.26 cm AV Vmax:           287.00 cm/s AV Vmean:          194.000 cm/s AV VTI:            0.535 m AV Peak Grad:      32.9 mmHg AV Mean Grad:      18.0 mmHg LVOT Vmax:         101.00 cm/s LVOT Vmean:        69.800 cm/s LVOT VTI:          0.215 m LVOT/AV VTI ratio: 0.40  AORTA Ao Root diam: 3.40 cm MITRAL VALVE               TRICUSPID VALVE MV Area (PHT): 3.85 cm    TR Peak grad:   28.3 mmHg MV Decel Time: 197 msec    TR Vmax:        266.00 cm/s MV E velocity: 71.30 cm/s MV A  velocity: 88.00 cm/s  SHUNTS MV E/A ratio:  0.81        Systemic VTI:  0.22 m                            Systemic Diam: 2.00 cm Georganna Archer Electronically signed by Georganna Archer Signature Date/Time: 09/25/2024/6:31:29 PM    Final    CT CHEST ABDOMEN PELVIS WO CONTRAST Result Date: 09/24/2024 CLINICAL DATA:  Unwitnessed fall with right rib pain. EXAM: CT CHEST, ABDOMEN AND PELVIS WITHOUT CONTRAST TECHNIQUE: Multidetector CT imaging of the chest, abdomen and pelvis was performed following the standard protocol without IV contrast. RADIATION DOSE REDUCTION: This exam was performed according to the departmental dose-optimization program which includes automated exposure control, adjustment of the mA and/or kV according to patient size and/or use of iterative reconstruction technique. COMPARISON:  CTA chest, abdomen, and pelvis dated 06/06/2024 FINDINGS: CT CHEST FINDINGS Cardiovascular: Left chest wall ICD leads terminate in the right atrium and ventricle. Normal heart size. No significant pericardial fluid/thickening. Dilated ascending thoracic aortic aneurysm measures 4.0 cm. Coronary artery calcifications. Mitral annular calcification. Mediastinum/Nodes: Imaged thyroid  gland without nodules meeting criteria for imaging follow-up by size. Small hiatal hernia.  No pathologically enlarged axillary, supraclavicular, mediastinal, or hilar lymph nodes. Lungs/Pleura: The central airways are patent. Bilateral lower lobe subsegmental atelectasis. Biapical pleural-parenchymal scarring and calcified plaque. 3 mm subpleural right middle lobe nodule (5:103), unchanged. No pneumothorax. Unchanged small left pleural effusion. Musculoskeletal: Subtle cortical angulation of the lateral right fifth, sixth, and seventh ribs. Nondisplaced fractures of the anterolateral right eighth and ninth ribs. Additional nondisplaced fracture of the posterolateral and posterior eighth rib at the costovertebral junction and posteromedial ninth  rib. Nondisplaced fracture of the right T8 transverse process. Degenerative changes of the shoulders. Median sternotomy wires are nondisplaced. Subcutaneous soft tissue reticulation along the inferolateral right chest wall and right flank. Multilevel degenerative changes of the thoracic spine. Unchanged mild vertebral body height loss of T9. CT ABDOMEN PELVIS FINDINGS Hepatobiliary: Scattered subcentimeter hypodensities, too small to characterize but likely cysts. Slightly prominent appearance of the caudate lobe lying medial to the IVC. No intra or extrahepatic biliary ductal dilation. Normal gallbladder. Pancreas: No focal lesions or main ductal dilation. Spleen: Normal in size without focal abnormality. Adrenals/Urinary Tract: No adrenal nodules. No suspicious renal lesions by noncontrast technique. Bilateral simple cysts, unchanged. No specific follow-up imaging recommended. No hydronephrosis or calculi. Trabeculated bladder contour with multiple small diverticula. Stomach/Bowel: Normal appearance of the stomach. 1.4 x 1.2 cm lipoma in the duodenal bulb (3:63). No evidence of bowel wall thickening, distention, or inflammatory changes. Colonic diverticulosis without acute diverticulitis. Normal appendix extends into the right inguinal hernia. Vascular/Lymphatic: Aortic atherosclerosis. Infrarenal abdominal aortic aneurysm status post repair with aorto bi-iliac stent graft. Excluded aneurysm sac measures 5.0 x 4.9 cm, unchanged. No enlarged abdominal or pelvic lymph nodes. Reproductive: Prostate is unremarkable. Penile prosthesis. Deflated reservoir in the midline anterior pelvis. Other: No free fluid, fluid collection, or free air. Musculoskeletal: No acute or abnormal lytic or blastic osseous findings. Small fat-containing bilateral inguinal hernias. Multilevel degenerative changes of the lumbar spine. IMPRESSION: 1. Nondisplaced fractures of the right eighth and ninth ribs, as described. 2. Nondisplaced  fracture of the right T8 transverse process. 3. Subtle cortical angulation of the lateral right fifth, sixth, and seventh ribs, which may represent nondisplaced fractures. 4. Subcutaneous soft tissue contusion along the inferolateral right chest wall and right flank. 5. Unchanged small left pleural effusion. 6. No acute traumatic injury in the abdomen or pelvis. 7.  Aortic Atherosclerosis (ICD10-I70.0). Electronically Signed   By: Limin  Xu M.D.   On: 09/24/2024 18:00   CT CERVICAL SPINE WO CONTRAST Result Date: 09/24/2024 EXAM: CT CERVICAL SPINE WITHOUT CONTRAST 09/24/2024 05:14:41 PM TECHNIQUE: CT of the cervical spine was performed without the administration of intravenous contrast. Multiplanar reformatted images are provided for review. Automated exposure control, iterative reconstruction, and/or weight based adjustment of the mA/kV was utilized to reduce the radiation dose to as low as reasonably achievable. COMPARISON: CT of the cervical spine 12/01/2022. CLINICAL HISTORY: Polytrauma, blunt. Fall in shower yesterday. FINDINGS: BONES AND ALIGNMENT: No acute fracture or traumatic malalignment. DEGENERATIVE CHANGES: Slight degenerative anterolisthesis at C4-C5 is stable. Uncovertebral and facet spurring contribute to moderate foraminal narrowing bilaterally at C3-C4 and C4-C5. Moderate left foraminal stenosis is present at C5-C6 and C6-C7. SOFT TISSUES: No prevertebral soft tissue swelling. Atherosclerotic changes are present at the aortic arch and great vessel origins. Bilateral carotid bifurcation stents are patent. IMPRESSION: 1. No acute cervical spine fracture or dislocation. Electronically signed by: Lonni Necessary MD 09/24/2024 05:28 PM EST RP Workstation: [id number]   CT HEAD WO CONTRAST Result Date: 09/24/2024 EXAM: CT HEAD  WITHOUT CONTRAST 09/24/2024 05:14:41 PM TECHNIQUE: CT of the head was performed without the administration of intravenous contrast. Automated exposure control, iterative  reconstruction, and/or weight based adjustment of the mA/kV was utilized to reduce the radiation dose to as low as reasonably achievable. COMPARISON: CT head without contrast 03/23/2023. CLINICAL HISTORY: Head trauma, moderate-severe. f aorta all in shower. FINDINGS: BRAIN AND VENTRICLES: No acute hemorrhage. No evidence of acute infarct. No hydrocephalus. No extra-axial collection. No mass effect or midline shift. Age-related atrophy. ORBITS: No acute abnormality. SINUSES: Partial opacification of the lower left mastoid air cells. Mucosal thickening of the right maxillary sinus. SOFT TISSUES AND SKULL: No acute soft tissue abnormality. No skull fracture. Atherosclerosis of skull base vasculature without hyperdense vessel or abnormal calcification. IMPRESSION: 1. No acute intracranial abnormality or evidence of acute trauma. Electronically signed by: Lonni Necessary MD 09/24/2024 05:25 PM EST RP Workstation: [id number]   DG Elbow Complete Right Result Date: 09/24/2024 CLINICAL DATA:  Blunt trauma EXAM: RIGHT ELBOW - COMPLETE 3+ VIEW COMPARISON:  None Available. FINDINGS: There is no evidence of fracture, dislocation, or joint effusion. There is no evidence of arthropathy or other focal bone abnormality. Soft tissues are unremarkable. IMPRESSION: Negative. Electronically Signed   By: Lynwood Landy Raddle M.D.   On: 09/24/2024 16:06   DG Chest Port 1 View Result Date: 09/24/2024 CLINICAL DATA:  Blunt trauma EXAM: PORTABLE CHEST 1 VIEW COMPARISON:  June 06, 2024 FINDINGS: Stable cardiomediastinal silhouette. Left-sided defibrillator is noted. Status post coronary bypass graft. Lungs are clear. Degenerative changes seen involving the right glenohumeral joint. IMPRESSION: No active disease. Electronically Signed   By: Lynwood Landy Raddle M.D.   On: 09/24/2024 16:04   DG Pelvis Portable Result Date: 09/24/2024 CLINICAL DATA:  Blunt trauma EXAM: PORTABLE PELVIS 1-2 VIEWS COMPARISON:  None Available. FINDINGS: There  is no evidence of pelvic fracture or diastasis. No pelvic bone lesions are seen. IMPRESSION: Negative. Electronically Signed   By: Lynwood Landy Raddle M.D.   On: 09/24/2024 16:01   DG Knee Right Port Result Date: 09/24/2024 CLINICAL DATA:  Blunt trauma EXAM: PORTABLE RIGHT KNEE - 1-2 VIEW COMPARISON:  None Available. FINDINGS: No evidence of fracture, dislocation, or joint effusion. Moderate narrowing of medial joint space is noted. Soft tissues are unremarkable. IMPRESSION: Moderate degenerative joint disease is noted medially. No acute abnormality seen. Electronically Signed   By: Lynwood Landy Raddle M.D.   On: 09/24/2024 16:00   CUP PACEART INCLINIC DEVICE CHECK Result Date: 08/29/2024 Normal in-clinic dual chamber ICD check. Presenting Rhythm: AP/VS. Routine testing was performed. Thresholds, sensing, and impedance demonstrate stable parameters and no programming changes needed. 1 episode of VT requiring 1 round of ATP on 08/21/24 and  MD discussed episode and driving restrictions with patient and spouse in room. Estimated longevity 100%. Pt enrolled in remote follow-up.Bard Silvan, BSN, RN   EKG:09/24/24 AF with RBBB, LAFB (personally reviewed)  TELEMETRY: AF 70-80's, occ PVC (personally reviewed)  DEVICE HISTORY:  Biotronik Dual Chamber ICD implanted 02/25/2022 for ICM / VT  Device Review / Check 09/27/23 > interrogation reviewed by industry.  Presenting APVS, Battery 100%, Impedence A: 578, V: 442, Sensing A: 1.7 mV, V: 4.1 mV, Threshold A: 1V @ 0.44ms, V: 0.7V @ 0.27ms.  VT zones reviewed > VT-1 170bpm  (monitor), VT-2 200 bpm, VF 222 bpm     Assessment/Plan:  Ventricular Tachycardia s/p ICD  VT in 08/2024. LVEF 50% 09/2024 -tele monitoring  -goal K+ >4, Mg+ >2 -continue Toprol  25  mg BID   -continue Mexiletine 150 mg BID, will discuss increase with MD  -consider magnesium  taurate at discharge / supplementation  -AAD discussion > limited options as not a candidate for Amiodarone  (previously did not  tolerate due to pulmonary toxicity) or Sotalol (though previously not felt to be a candidate for AF trmt due to VT) & due to drug-drug interaction with primidone . Primidone  is used for tremor.  Consider stopping and adding AAD given two recent therapies by device. Discussed with patient and wife.   Atrial Fibrillation  -OAC for stroke prophylaxis  -toprol  as above  Chronic Systolic Heart Failure  NYHA II -euvolemic on exam  (only trace LE edema)  -per primary team     For questions or updates, please contact [facility] Please consult [email or web address] for contact info under     Signed, Daphne Barrack, NP-C, AGACNP-BC [facility] - Electrophysiology  09/26/2024, 10:11 AM         [1]  Allergies Allergen Reactions   Ancef [Cefazolin Sodium] Hives   Jardiance [Empagliflozin] Other (See Comments)    Dizziness   Vancomycin  Rash   "

## 2024-09-26 NOTE — Progress Notes (Signed)
 Physical Therapy Treatment Patient Details Name: LIAHM GRIVAS MRN: [id number] DOB: August 22, 1931 Today's Date: 09/26/2024   History of Present Illness 89 y.o. male presented 09/24/24 s/p syncopal episode in shower. Found to have nondisplaced fx of R 8-9th ribs, T8 TP fx. Device interrogation shows an episode of VT terminated with an ICD shock that coincides with the syncopal event.  PMH: paroxysmal A-fib on Eliquis , combined diastolic and systolic CHF, CAD with CABG, AAA status postrepair, bilateral carotid stenosis s/p stenting, large left ventricle pseudoaneurysm, hypertension, hyperlipidemia, hypothyroidism, BPH, VT s/p ICD, CKD,    PT Comments  Patient continues with significant pain in left side despite pre-medication for pain. Rates pain 8/10 at rest and 10/10 when moving. Continue to recommend use of RW due to bil knee pain/arthritis with pt reporting history of buckling. Pt to ask wife to bring in his knee brace. Tolerated ambulation x 140 ft with CGA and RW with sats 90% on RA. Patient with OT on departure.     If plan is discharge home, recommend the following: A little help with bathing/dressing/bathroom;Assistance with cooking/housework;Assist for transportation;Help with stairs or ramp for entrance   Can travel by private vehicle        Equipment Recommendations  None recommended by PT    Recommendations for Other Services       Precautions / Restrictions Precautions Precautions: Fall;Other (comment) Recall of Precautions/Restrictions: Intact Precaution/Restrictions Comments: watch O2 with activity Restrictions Weight Bearing Restrictions Per Provider Order: No     Mobility  Bed Mobility               General bed mobility comments: pt sitting EOB after eating; discussed his exit to left side of bed this a.m. and he stated it's really no less painful; he does plan to switch to left side of bed at home    Transfers Overall transfer level: Needs  assistance Equipment used: Rolling walker (2 wheels) Transfers: Sit to/from Stand Sit to Stand: Contact guard assist, From elevated surface (simulated home bed height)           General transfer comment: pt used correct hand placement    Ambulation/Gait Ambulation/Gait assistance: Contact guard assist Gait Distance (Feet): 140 Feet Assistive device: Rolling walker (2 wheels) Gait Pattern/deviations: Step-through pattern, Trunk flexed   Gait velocity interpretation: 1.31 - 2.62 ft/sec, indicative of limited community ambulator   General Gait Details: vc for upright posture and proximity to RW; pt reports occasional knee buckling and usually wears a knee brace; asked him to have wife bring it in   Stairs             Wheelchair Mobility     Tilt Bed    Modified Rankin (Stroke Patients Only)       Balance Overall balance assessment: Needs assistance Sitting-balance support: No upper extremity supported, Feet unsupported Sitting balance-Leahy Scale: Good     Standing balance support: Bilateral upper extremity supported, Reliant on assistive device for balance Standing balance-Leahy Scale: Poor                              Communication Communication Communication: No apparent difficulties  Cognition Arousal: Alert Behavior During Therapy: WFL for tasks assessed/performed                             Following commands: Intact      Cueing Cueing Techniques:  Verbal cues  Exercises      General Comments General comments (skin integrity, edema, etc.): on RA with sats down to 90% with ambulation; seated recovered to 94%; instructed in PLB and to slow breathing; educated on importance of upright sitting for preventing pna      Pertinent Vitals/Pain Pain Assessment Pain Assessment: 0-10 Pain Score: 8  Pain Location: Rt flank Pain Descriptors / Indicators: Grimacing, Guarding, Moaning Pain Intervention(s): Limited activity within  patient's tolerance, Monitored during session, Premedicated before session    Home Living Family/patient expects to be discharged to:: Private residence Living Arrangements: Spouse/significant other Available Help at Discharge: Family;Available 24 hours/day Type of Home: House Home Access: Stairs to enter Entrance Stairs-Rails: Left Entrance Stairs-Number of Steps: 5   Home Layout: Two level;Able to live on main level with bedroom/bathroom Home Equipment: Cane - single point;Rolling Walker (2 wheels);Shower seat - built in;Grab bars - tub/shower;Hand held shower head Additional Comments: 2 falls last 6 months using cane.    Prior Function            PT Goals (current goals can now be found in the care plan section) Acute Rehab PT Goals Patient Stated Goal: decr pain Time For Goal Achievement: 10/09/24 Potential to Achieve Goals: Good Progress towards PT goals: Progressing toward goals    Frequency    Min 3X/week      PT Plan      Co-evaluation              AM-PAC PT 6 Clicks Mobility   Outcome Measure  Help needed turning from your back to your side while in a flat bed without using bedrails?: A Little Help needed moving from lying on your back to sitting on the side of a flat bed without using bedrails?: A Little Help needed moving to and from a bed to a chair (including a wheelchair)?: A Little Help needed standing up from a chair using your arms (e.g., wheelchair or bedside chair)?: A Little Help needed to walk in hospital room?: A Little Help needed climbing 3-5 steps with a railing? : A Little 6 Click Score: 18    End of Session Equipment Utilized During Treatment: Gait belt Activity Tolerance: Patient limited by pain Patient left: with call bell/phone within reach;in chair;with chair alarm set;Other (comment) (OT present) Nurse Communication: Mobility status;Other (comment) (down to 90% when walking; 94% at rest on RA after ambulation) PT Visit  Diagnosis: Difficulty in walking, not elsewhere classified (R26.2);Pain Pain - Right/Left: Right Pain - part of body:  (flank)     Time: 9166-9153 PT Time Calculation (min) (ACUTE ONLY): 13 min  Charges:    $Gait Training: 8-22 mins PT General Charges $$ ACUTE PT VISIT: 1 Visit                      Macario RAMAN, PT Acute Rehabilitation Services  Office [phone]    Macario SHAUNNA Soja 09/26/2024, 8:58 AM

## 2024-09-26 NOTE — Evaluation (Signed)
 Occupational Therapy Evaluation Patient Details Name: Eric Gordon MRN: [id number] DOB: 05/09/1931 Today's Date: 09/26/2024   History of Present Illness   89 y.o. male presented 09/24/24 s/p syncopal episode in shower. Found to have nondisplaced fx of R 8-9th ribs, T8 TP fx. Device interrogation shows an episode of VT terminated with an ICD shock that coincides with the syncopal event.  PMH: paroxysmal A-fib on Eliquis , combined diastolic and systolic CHF, CAD with CABG, AAA status postrepair, bilateral carotid stenosis s/p stenting, large left ventricle pseudoaneurysm, hypertension, hyperlipidemia, hypothyroidism, BPH, VT s/p ICD, CKD,     Clinical Impressions PTA, pt lived with his spouse and reports being independent in BADL and IADL within the home. Upon eval, pt using RW for functional mobility with CGA and up to min cues for navigating narrow spaces as pt typically does not use this type of AD. Pt currently needing up to min A for BADL due to pain with R shoulder ROM/reaching. Will follow acutely, and recommending HHOT at discharge to facilitate return to BADL. Provided education regarding upright positioning for PNA prevention, compensatory techniques for LB ADL, and activity pacing.      If plan is discharge home, recommend the following:   A little help with walking and/or transfers;A little help with bathing/dressing/bathroom;Assistance with cooking/housework;Assist for transportation;Help with stairs or ramp for entrance     Functional Status Assessment   Patient has had a recent decline in their functional status and demonstrates the ability to make significant improvements in function in a reasonable and predictable amount of time.     Equipment Recommendations   None recommended by OT     Recommendations for Other Services         Precautions/Restrictions   Precautions Precautions: Fall;Other (comment) Recall of Precautions/Restrictions:  Intact Precaution/Restrictions Comments: watch O2 with activity Restrictions Weight Bearing Restrictions Per Provider Order: No     Mobility Bed Mobility               General bed mobility comments: received standing next to bed with PT    Transfers Overall transfer level: Needs assistance Equipment used: Rolling walker (2 wheels) Transfers: Sit to/from Stand Sit to Stand: Contact guard assist, From elevated surface           General transfer comment: pt used correct hand placement      Balance Overall balance assessment: Needs assistance Sitting-balance support: No upper extremity supported, Feet unsupported Sitting balance-Leahy Scale: Good     Standing balance support: Bilateral upper extremity supported, Reliant on assistive device for balance Standing balance-Leahy Scale: Poor                             ADL either performed or assessed with clinical judgement   ADL Overall ADL's : Needs assistance/impaired Eating/Feeding: Set up;Sitting   Grooming: Contact guard assist;Standing   Upper Body Bathing: Set up;Sitting   Lower Body Bathing: Minimal assistance;Sit to/from stand   Upper Body Dressing : Minimal assistance;Sitting   Lower Body Dressing: Minimal assistance;Sit to/from stand   Toilet Transfer: Contact guard assist;Rolling walker (2 wheels)   Toileting- Clothing Manipulation and Hygiene: Contact guard assist;Sit to/from stand;Sitting/lateral lean       Functional mobility during ADLs: Contact guard assist;Rolling walker (2 wheels)       Vision Baseline Vision/History: 1 Wears glasses Patient Visual Report: No change from baseline Vision Assessment?: No apparent visual deficits Additional Comments: not formally assessed  Perception         Praxis         Pertinent Vitals/Pain Pain Assessment Pain Assessment: Faces Faces Pain Scale: Hurts even more Pain Location: Rt flank Pain Descriptors / Indicators:  Grimacing, Guarding, Moaning Pain Intervention(s): Limited activity within patient's tolerance, Monitored during session     Extremity/Trunk Assessment Upper Extremity Assessment Upper Extremity Assessment: RUE deficits/detail RUE Deficits / Details: decreased shoulder AROM 0-~20 AROM 0-~80 AAROM. limited by pain given rib fxs   Lower Extremity Assessment Lower Extremity Assessment: Defer to PT evaluation   Cervical / Trunk Assessment Cervical / Trunk Assessment: Kyphotic   Communication Communication Communication: No apparent difficulties   Cognition Arousal: Alert Behavior During Therapy: WFL for tasks assessed/performed Cognition: No apparent impairments             OT - Cognition Comments: some decr STM noted but functional given age. Recalled therapist checking on him before breakfast on return for eval                 Following commands: Intact       Cueing  General Comments   Cueing Techniques: Verbal cues  on RA with sats down to 90% with ambulation; seated recovered to 94%; instructed in PLB and to slow breathing; educated on importance of upright sitting for preventing pna   Exercises     Shoulder Instructions      Home Living Family/patient expects to be discharged to:: Private residence Living Arrangements: Spouse/significant other Available Help at Discharge: Family;Available 24 hours/day Type of Home: House Home Access: Stairs to enter [employer] of Steps: 5 Entrance Stairs-Rails: Left Home Layout: Two level;Able to live on main level with bedroom/bathroom     Bathroom Shower/Tub: Walk-in shower   Bathroom Toilet: Handicapped height     Home Equipment: Cane - single Librarian, Academic (2 wheels);Shower seat - built in;Grab bars - tub/shower;Hand held shower head   Additional Comments: 2 falls last 6 months using cane.      Prior Functioning/Environment Prior Level of Function : Independent/Modified Independent              Mobility Comments: uses cane in RUE ADLs Comments: independent with B/D, drives, volunteers at the [employer] on wednesdays    OT Problem List: Decreased strength;Impaired balance (sitting and/or standing);Decreased safety awareness;Decreased knowledge of use of DME or AE;Decreased knowledge of precautions;Pain   OT Treatment/Interventions: Self-care/ADL training;Therapeutic exercise;DME and/or AE instruction;Therapeutic activities;Patient/family education;Balance training      OT Goals(Current goals can be found in the care plan section)   Acute Rehab OT Goals Patient Stated Goal: get better OT Goal Formulation: With patient Time For Goal Achievement: 10/10/24 Potential to Achieve Goals: Good   OT Frequency:  Min 2X/week    Co-evaluation              AM-PAC OT 6 Clicks Daily Activity     Outcome Measure Help from another person eating meals?: A Little Help from another person taking care of personal grooming?: A Little Help from another person toileting, which includes using toliet, bedpan, or urinal?: A Little Help from another person bathing (including washing, rinsing, drying)?: A Little Help from another person to put on and taking off regular upper body clothing?: A Little Help from another person to put on and taking off regular lower body clothing?: A Little 6 Click Score: 18   End of Session Equipment Utilized During Treatment: Rolling walker (2 wheels);Gait belt Nurse Communication: Mobility status  Activity Tolerance: Patient tolerated treatment well Patient left: in chair;with call bell/phone within reach;with chair alarm set  OT Visit Diagnosis: Unsteadiness on feet (R26.81);Muscle weakness (generalized) (M62.81);History of falling (Z91.81);Pain Pain - Right/Left: Right Pain - part of body:  (ribs)                Time: 9151-9096 OT Time Calculation (min): 15 min Charges:  OT General Charges $OT Visit: 1 Visit OT Evaluation $OT Eval Low  Complexity: 1 Low  Elma JONETTA Lebron FREDERICK, OTR/L [facility] Acute Rehabilitation Office: [phone]   Elma JONETTA Lebron 09/26/2024, 9:28 AM

## 2024-09-26 NOTE — TOC CAGE-AID Note (Signed)
 Transition of Care [facility]) - CAGE-AID Screening  Patient Details  Name: Eric Gordon MRN: [id number] Date of Birth: [birth year]  Clinical Narrative:  Patient endorses occasional alcohol  use, denies any drug use. Patient denies need for substance abuse resources at this time.  CAGE-AID Screening:   Have You Ever Felt You Ought to Cut Down on Your Drinking or Drug Use?: No Have People Annoyed You By Critizing Your Drinking Or Drug Use?: No Have You Felt Bad Or Guilty About Your Drinking Or Drug Use?: No Have You Ever Had a Drink or Used Drugs First Thing In The Morning to Steady Your Nerves or to Get Rid of a Hangover?: No CAGE-AID Score: 0  Substance Abuse Education Offered: Yes

## 2024-09-27 ENCOUNTER — Other Ambulatory Visit (HOSPITAL_COMMUNITY): Payer: Self-pay

## 2024-09-27 ENCOUNTER — Inpatient Hospital Stay (HOSPITAL_COMMUNITY)

## 2024-09-27 DIAGNOSIS — I4901 Ventricular fibrillation: Secondary | ICD-10-CM

## 2024-09-27 DIAGNOSIS — Z4502 Encounter for adjustment and management of automatic implantable cardiac defibrillator: Secondary | ICD-10-CM

## 2024-09-27 DIAGNOSIS — I350 Nonrheumatic aortic (valve) stenosis: Secondary | ICD-10-CM

## 2024-09-27 LAB — ECHOCARDIOGRAM LIMITED
AR max vel: 1.39 cm2
AV Area VTI: 1.44 cm2
AV Area mean vel: 1.4 cm2
AV Mean grad: 20 mmHg
AV Peak grad: 36 mmHg
Ao pk vel: 3 m/s
Area-P 1/2: 4.31 cm2
Calc EF: 45 %
Height: 75 in
Single Plane A2C EF: 53.2 %
Single Plane A4C EF: 35.2 %
Weight: 3332.8 [oz_av]

## 2024-09-27 MED ORDER — METOPROLOL SUCCINATE ER 25 MG PO TB24
25.0000 mg | ORAL_TABLET | Freq: Two times a day (BID) | ORAL | 0 refills | Status: AC
  Filled 2024-09-27: qty 60, 30d supply, fill #0

## 2024-09-27 MED ORDER — HYDROCODONE-ACETAMINOPHEN 5-325 MG PO TABS: 1.0000 | ORAL_TABLET | ORAL | 0 refills | Status: DC | PRN

## 2024-09-27 MED ORDER — SENNOSIDES-DOCUSATE SODIUM 8.6-50 MG PO TABS
1.0000 | ORAL_TABLET | Freq: Every evening | ORAL | 0 refills | Status: AC | PRN
  Filled 2024-09-27: qty 30, 30d supply, fill #0

## 2024-09-27 MED ORDER — MEXILETINE HCL 250 MG PO CAPS
250.0000 mg | ORAL_CAPSULE | Freq: Two times a day (BID) | ORAL | 0 refills | Status: AC
  Filled 2024-09-27: qty 20, 10d supply, fill #0

## 2024-09-27 MED ORDER — HYDROCODONE-ACETAMINOPHEN 5-325 MG PO TABS
1.0000 | ORAL_TABLET | Freq: Four times a day (QID) | ORAL | 0 refills | Status: AC | PRN
  Filled 2024-09-27: qty 24, 6d supply, fill #0

## 2024-09-27 MED ORDER — MEXILETINE HCL 250 MG PO CAPS
250.0000 mg | ORAL_CAPSULE | Freq: Two times a day (BID) | ORAL | Status: DC
  Filled 2024-09-27: qty 1

## 2024-09-27 MED ORDER — PERFLUTREN LIPID MICROSPHERE: 1.0000 mL | INTRAVENOUS | Status: AC | PRN

## 2024-09-27 NOTE — TOC Transition Note (Signed)
 Transition of Care [facility]) - Discharge Note   Patient Details  Name: Eric Gordon MRN: [id number] Date of Birth: [birth year]  [facility] of [facility]) CM/SW Contact:  Andrez JULIANNA George, RN Phone Number: 09/27/2024, 3:12 PM   Clinical Narrative:     Pt is discharging home with his spouse and HH through [facility]. [facility] will contact him for the first home visit.  Pt has transportation home.   Final next level of care: Home w Home Health Services Barriers to Discharge: No Barriers Identified   Patient Goals and CMS Choice   CMS Medicare.gov Compare Post Acute Care list provided to:: Patient Choice offered to / list presented to : Patient      Discharge Placement                       Discharge Plan and Services Additional resources added to the After Visit Summary for     Discharge Planning Services: CM Consult Post Acute Care Choice: Home Health                    HH Arranged: PT, OT, Nurse's Aide HH Agency: [facility] Date [facility] Agency Contacted: 09/26/24   Representative spoke with at [facility] Agency: [place]  Social Drivers of Health (SDOH) Interventions SDOH Screenings   Food Insecurity: No Food Insecurity (09/26/2024)  Housing: Low Risk (09/26/2024)  Transportation Needs: No Transportation Needs (09/26/2024)  Utilities: Not At Risk (09/26/2024)  Social Connections: Unknown (09/26/2024)  Tobacco Use: Medium Risk (09/24/2024)     Readmission Risk Interventions     No data to display

## 2024-09-27 NOTE — Progress Notes (Signed)
 Physical Therapy Treatment Patient Details Name: Eric Gordon MRN: [id number] DOB: 30-Aug-1931 Today's Date: 09/27/2024   History of Present Illness 89 y.o. male presented 09/24/24 s/p syncopal episode in shower. Found to have nondisplaced fx of R 8-9th ribs, T8 TP fx. Device interrogation shows an episode of VT terminated with an ICD shock that coincides with the syncopal event.  PMH: paroxysmal A-fib on Eliquis , combined diastolic and systolic CHF, CAD with CABG, AAA status postrepair, bilateral carotid stenosis s/p stenting, large left ventricle pseudoaneurysm, hypertension, hyperlipidemia, hypothyroidism, BPH, VT s/p ICD, CKD,    PT Comments  Patient pre-medicated for pain. Patient overall at supervision level for mobility with RW. Completed stair training with L rail without difficulty. Anticipate discharge home today with HHPT to follow.     If plan is discharge home, recommend the following: Assistance with cooking/housework;Assist for transportation   Can travel by private vehicle        Equipment Recommendations  None recommended by PT    Recommendations for Other Services       Precautions / Restrictions Precautions Precautions: Fall;Other (comment) Recall of Precautions/Restrictions: Intact Restrictions Weight Bearing Restrictions Per Provider Order: No     Mobility  Bed Mobility Overal bed mobility: Needs Assistance Bed Mobility: Supine to Sit     Supine to sit: HOB elevated, Supervision     General bed mobility comments: pt again wanting to exit Rt side of bed (his normal at home); has an adjustable bed and simulated home setup    Transfers Overall transfer level: Needs assistance Equipment used: Rolling walker (2 wheels) Transfers: Sit to/from Stand Sit to Stand: From elevated surface, Supervision           General transfer comment: pt used correct hand placement; bed elevated to simulate home environment    Ambulation/Gait Ambulation/Gait  assistance: Supervision Gait Distance (Feet): 160 Feet Assistive device: Rolling walker (2 wheels) Gait Pattern/deviations: Step-through pattern, Trunk flexed   Gait velocity interpretation: 1.31 - 2.62 ft/sec, indicative of limited community ambulator   General Gait Details: vc for upright posture and proximity to RW;   Stairs Stairs: Yes Stairs assistance: Contact guard assist Stair Management: One rail Left, Step to pattern, Forwards Number of Stairs: 4 General stair comments: no difficulty   Wheelchair Mobility     Tilt Bed    Modified Rankin (Stroke Patients Only)       Balance Overall balance assessment: Needs assistance Sitting-balance support: No upper extremity supported, Feet unsupported Sitting balance-Leahy Scale: Good     Standing balance support: No upper extremity supported, During functional activity Standing balance-Leahy Scale: Fair Standing balance comment: stood to use urinal; mild sway without LOB                            Communication Communication Communication: No apparent difficulties  Cognition Arousal: Alert Behavior During Therapy: WFL for tasks assessed/performed                             Following commands: Intact      Cueing Cueing Techniques: Verbal cues  Exercises      General Comments General comments (skin integrity, edema, etc.): Reiterated use of folded towel on rt side to splint ribs for pain management (especially if coughing)      Pertinent Vitals/Pain Pain Assessment Pain Assessment: Faces Faces Pain Scale: Hurts little more Pain Location: Rt flank Pain Descriptors /  Indicators: Guarding Pain Intervention(s): Limited activity within patient's tolerance, Monitored during session, Premedicated before session    Home Living                          Prior Function            PT Goals (current goals can now be found in the care plan section) Acute Rehab PT Goals Patient  Stated Goal: decr pain PT Goal Formulation: With patient Time For Goal Achievement: 10/09/24 Potential to Achieve Goals: Good Progress towards PT goals: Progressing toward goals    Frequency    Min 3X/week      PT Plan      Co-evaluation              AM-PAC PT 6 Clicks Mobility   Outcome Measure  Help needed turning from your back to your side while in a flat bed without using bedrails?: None Help needed moving from lying on your back to sitting on the side of a flat bed without using bedrails?: A Little Help needed moving to and from a bed to a chair (including a wheelchair)?: A Little Help needed standing up from a chair using your arms (e.g., wheelchair or bedside chair)?: A Little Help needed to walk in hospital room?: A Little Help needed climbing 3-5 steps with a railing? : A Little 6 Click Score: 19    End of Session Equipment Utilized During Treatment: Gait belt Activity Tolerance: Patient tolerated treatment well Patient left: with call bell/phone within reach;in chair;with chair alarm set Nurse Communication: Mobility status;Other (comment) (stair training completed) PT Visit Diagnosis: Difficulty in walking, not elsewhere classified (R26.2);Pain Pain - Right/Left: Right Pain - part of body:  (flank)     Time: 9073-9056 PT Time Calculation (min) (ACUTE ONLY): 17 min  Charges:    $Gait Training: 8-22 mins PT General Charges $$ ACUTE PT VISIT: 1 Visit                      Macario RAMAN, PT Acute Rehabilitation Services  Office [phone]    Macario SHAUNNA Soja 09/27/2024, 9:53 AM

## 2024-09-27 NOTE — Progress Notes (Addendum)
 "  Patient Name: Eric Gordon Date of Encounter: 09/27/2024  Primary Cardiologist: None Electrophysiologist: Eric T LAMBERT, MD  Interval Summary   The patient is doing well today.  At this time, the patient denies chest pain, shortness of breath, or any new concerns.  Vital Signs    Vitals:   09/27/24 0332 09/27/24 0500 09/27/24 0809 09/27/24 0817  BP: 109/75  (!) 113/57   Pulse: 83  83 88  Resp: 19   18  Temp: 98 F (36.7 C)   97.6 F (36.4 C)  TempSrc: Oral   Oral  SpO2: 91%   91%  Weight:  94.5 kg    Height:        Intake/Output Summary (Last 24 hours) at 09/27/2024 1108 Last data filed at 09/27/2024 0900 Gross per 24 hour  Intake 720 ml  Output 625 ml  Net 95 ml   Filed Weights   09/24/24 2300 09/26/24 0622 09/27/24 0500  Weight: 91.6 kg 93.7 kg 94.5 kg    Physical Exam    GEN- NAD, Alert and oriented  Lungs- Clear to ausculation bilaterally, normal work of breathing Cardiac- Regular rate and rhythm, no murmurs, rubs or gallops GI- soft, NT, ND, + BS Extremities- no clubbing or cyanosis. No edema  Telemetry    AP 70-90's, occ PVC's (personally reviewed)   DEVICE HISTORY:  Biotronik Dual Chamber ICD implanted 02/25/2022 for ICM / VT  Device Review / Check 09/27/23 > interrogation reviewed by industry.  Presenting APVS, Battery 100%, Impedence A: 578, V: 442, Sensing A: 1.7 mV, V: 4.1 mV, Threshold A: 1V @ 0.57ms, V: 0.7V @ 0.20ms.  VT zones reviewed > VT-1 170bpm  (monitor), VT-2 200 bpm, VF 222 bpm VT Zones adjusted to add a VT-1 therapy zone at 182 bpm with ATPx3, ATP x3 followed by [facility]    Eric Gordon is a 89 y.o. male with PMH of HFrEF, VT s/p ICD, AF, CAD s/p CABG, OSA admitted for syncope.  Device interrogation showed VT/VF event on 09/24/23 - PVC that initiates VT with cycle length of 210-250 ms s/p 40j HV therapy with Type II break.  Noted to have rib fractures and R T8 transverse process fracture. VT zones reviewed and  adjusted, ATP optimization turned on.  Assessment & Plan    Ventricular Tachycardia s/p ICD  VT in 08/2024. LVEF 50% 09/2024 -change Mexiletine to 250 mg BID  -continue Toprol  25mg  BID > BP appears to have tolerated increase  -no further VT on tele  -see prior AAD discussion from 09/26/24 note -Eric Gordon plan for EP follow up as outpatient  -moderate AS, less likely based on repeat ECHO that it is contributing to VT with relative low flow state   Paroxysmal Atrial Fibrillation  -OAC for stroke prophylaxis  -Toprol  as above   HFrEF  -per primary team   VHD: Moderate Aortic Stenosis  -reviewed with Eric Gordon, repeat limited ECHO to review AV shows moderate AS -plan to follow up with HF Clinic in February    Ok to discharge home from EP perspective.    For questions or updates, please contact Eric Gordon Please consult [email or web address] for contact info under     Signed, Eric Barrack, NP-C, AGACNP-BC Eric Gordon - Electrophysiology  09/27/2024, 11:09 AM   I have seen and examined this patient with Eric Gordon.  Agree with above, note added to reflect my findings.  Feeling well.  No chest pain or shortness  of breath.  GEN: No acute distress.   Neck: No JVD Cardiac: Regular rhythm Respiratory: Normal work of breathing GI: Soft, nontender, non-distended  MS: No edema; No deformity. Neuro:  Nonfocal  Skin: warm and dry, device site well healed Psych: Normal affect    Ventricular tachycardia: Patient is status post ICD.  Eric Gordon increase mexiletine to 250 mg twice daily and continue metoprolol .  No further ventricular tachycardia on telemetry.  Eric Gordon plan for follow-up as an outpatient. Paroxysmal atrial fibrillation: Continue anticoagulation for stroke prophylaxis.  Well-controlled.  Continue metoprolol . Moderate aortic stenosis: Echo shows moderate AS.  Plan follow-up in clinic.  Driving restrictions discussed with the patient.  Knows not to drive for the next 6  months per Eric Gordon  law  Eric Gordon Eric Gordon sign off.   Medication Recommendations: Mexiletine 250 mg twice daily, Toprol  25 mg twice daily Other recommendations (labs, testing, etc):   Follow up as an outpatient: Eric Gordon be arranged in EP clinic   Eric Gordon M. Dorthey Depace MD 09/27/2024 2:52 PM  "

## 2024-09-27 NOTE — Plan of Care (Signed)

## 2024-09-27 NOTE — Discharge Summary (Signed)
 " Physician Discharge Summary   Patient: Eric Gordon MRN: [id number] DOB: 1930/12/14  Admit date:     09/24/2024  Discharge date: 09/27/2024  Discharge Physician: Lonni KANDICE Moose   PCP: [name], MD   Recommendations at discharge:    Follow up with EP cardiology as instructed. Follow-up with your PCP in the next 1 to 2 weeks concerning rib fractures  Discharge Diagnoses: Principal Problem:   Syncope and collapse Active Problems:   Chronic combined systolic and diastolic heart failure (HCC)   CKD stage 3b, GFR 30-44 ml/min (HCC)   Acquired hypothyroidism   HLD (hyperlipidemia)   Paroxysmal atrial fibrillation (HCC)   Moderate aortic stenosis   VT (ventricular tachycardia) (HCC)   Multiple rib fractures   Elevated troponin   Fracture of thoracic transverse process (HCC)   ICD (implantable cardioverter-defibrillator) discharge   VF (ventricular fibrillation) (HCC)  Resolved Problems:   * No resolved hospital problems. *  Hospital Course: 89 year old male with history of heart failure, AICD in place, history of V-fib and V. tach, paroxysmal atrial fibrillation and moderate aortic stenosis, chronic kidney disease, who was in his usual state of health until September 23, 2024 when he experienced a syncopal event sitting the shower at home.  After the syncopal event he complained of right sided chest pain.  He presented to the [facility] emergency room on September 24, 2024 for evaluation.  Subsequent workup showed right sided 8th and 9th rib fractures, and a nondisplaced fracture of the right T8 transverse vertebral process.  Patient was seen by cardiology and his AICD was interrogated demonstrating an episode of ventricular tachycardia that was terminated with an ICD shock and coincides with a syncopal event.  Cardiology increased his mexiletine to 250 mg p.o. twice daily and to continue Toprol  XL 25 mg p.o. twice daily.  He will have EP follow-up as an outpatient.  At the time of  discharge she was afebrile hemodynamically stable clinically improved.  He had some residual pleuritic chest pain related to rib fractures and was given a limited prescription for hydrocodone .  He was felt stable for discharge with outpatient follow-up. Assessment and Plan: No notes have been filed under this hospital service. Service: Hospitalist       Pain control - [place]  Controlled Substance Reporting System database was reviewed. and patient was instructed, not to drive, operate heavy machinery, perform activities at heights, swimming or participation in water  activities or provide baby-sitting services while on Pain, Sleep and Anxiety Medications; until their outpatient Physician has advised to do so again. Also recommended to not to take more than prescribed Pain, Sleep and Anxiety Medications.  Consultants: Fonda Kitty, MD, [place] cardiac electrophysiology Procedures performed: CT head without contrast, CT cervical spine without contrast, CT chest abdomen pelvis without contrast, 2D echocardiogram, pacemaker/A-fib interrogation Disposition: Home health Diet recommendation:  Discharge Diet Orders (From admission, onward)     Start     Ordered   09/27/24 0000  Diet - low sodium heart healthy        09/27/24 1440           Cardiac diet DISCHARGE MEDICATION: Allergies as of 09/27/2024       Reactions   Ancef [cefazolin Sodium] Hives   Jardiance [empagliflozin] Other (See Comments)   Dizziness   Vancomycin  Rash        Medication List     TAKE these medications    acetaminophen  500 MG tablet Commonly known as: TYLENOL  Take  1,000 mg by mouth every 6 (six) hours as needed for mild pain (pain score 1-3) or moderate pain (pain score 4-6).   albuterol  108 (90 Base) MCG/ACT inhaler Commonly known as: VENTOLIN  HFA Inhale 2 puffs into the lungs every 6 (six) hours as needed for wheezing or shortness of breath.   atorvastatin  80 MG tablet Commonly known as:  LIPITOR  Take 1 tablet (80 mg total) by mouth daily. What changed: when to take this   carboxymethylcellulose 0.5 % Soln Commonly known as: REFRESH PLUS Place 1 drop into both eyes every 6 (six) hours as needed (dry/watery eyes).   cetirizine 10 MG tablet Commonly known as: ZYRTEC Take 10 mg by mouth daily.   edoxaban  30 MG Tabs tablet Commonly known as: SAVAYSA  Take 30 mg by mouth daily.   fluticasone 50 MCG/ACT nasal spray Commonly known as: FLONASE Place 2 sprays into both nostrils daily as needed for allergies or rhinitis.   furosemide  40 MG tablet Commonly known as: LASIX  Take 40 mg by mouth daily.   HYDROcodone -acetaminophen  5-325 MG tablet Commonly known as: NORCO/VICODIN Take 1 tablet by mouth every 4 (four) hours as needed for up to 5 days for moderate pain (pain score 4-6).   levothyroxine  75 MCG tablet Commonly known as: SYNTHROID  Take 75 mcg by mouth daily before breakfast.   lidocaine  5 % Commonly known as: LIDODERM  Place 1 patch onto the skin daily. Remove & Discard patch within 12 hours or as directed by MD   metoprolol  succinate 25 MG 24 hr tablet Commonly known as: Toprol  XL Take 1 tablet (25 mg total) by mouth 2 (two) times daily. What changed: when to take this   mexiletine 250 MG capsule Commonly known as: MEXITIL  Take 1 capsule (250 mg total) by mouth every 12 (twelve) hours. What changed:  medication strength how much to take when to take this   multivitamin with minerals Tabs tablet Take 1 tablet by mouth in the morning.   pantoprazole  40 MG tablet Commonly known as: PROTONIX  Take 40 mg by mouth daily before breakfast.   primidone  50 MG tablet Commonly known as: MYSOLINE  Take 50 mg by mouth daily.   senna-docusate 8.6-50 MG tablet Commonly known as: Senokot-S Take 1 tablet by mouth at bedtime as needed for mild constipation.   spironolactone  25 MG tablet Commonly known as: ALDACTONE  Take 12.5 mg by mouth 2 (two) times daily.    traMADol  50 MG tablet Commonly known as: ULTRAM  Take 50 mg by mouth every 12 (twelve) hours as needed for moderate pain (pain score 4-6).   Vitamin D -3 25 MCG (1000 UT) Caps Take 1,000 Units by mouth at bedtime.        Contact information for after-discharge care     Home Medical Care     [facility]) .   Service: Home Health Services Contact information: [address] [place]  [address] [phone]                    Discharge Exam: Fredricka Weights   09/24/24 2300 09/26/24 0622 09/27/24 0500  Weight: 91.6 kg 93.7 kg 94.5 kg   Awake alert comfortable sitting in the recliner EENT: PERRLA E EOMI no scleral icterus no JVD oropharynx is clear Cardiac: Regular rate and rhythm S1-S2 no murmurs rubs gallops Lungs: Are clear throughout with good air entry Abdomen: Soft, nontender, nondistended bowel sounds present Extremities: No cyanosis clubbing with trace ankle edem  neurologic: Awake alert conversant coherent moves all extremities symmetrically  Condition at discharge: stable  The results of significant diagnostics from this hospitalization (including imaging, microbiology, ancillary and laboratory) are listed below for reference.   Imaging Studies: ECHOCARDIOGRAM LIMITED Result Date: 09/27/2024    ECHOCARDIOGRAM LIMITED REPORT   Patient Name:   MACEN JOSLIN Date of Exam: 09/27/2024 Medical Rec #:  [id number]           Height:       75.0 in Accession #:    [id number]          Weight:       208.3 lb Date of Birth:  1931/08/13          BSA:          2.233 m Patient Age:    89 years            BP:           113/57 mmHg Patient Gender: M                   HR:           75 bpm. Exam Location:  Inpatient Procedure: Limited Echo, Cardiac Doppler and Color Doppler (Both Spectral and            Color Flow Doppler were utilized during procedure). Indications:    I35.0 Nonrheumatic aortic (valve) stenosis   History:        Patient has prior history of Echocardiogram examinations, most                 recent 09/25/2024. CHF, Abnormal ECG and Defibrillator, Aortic                 Valve Disease, Arrythmias:Atrial Fibrillation and VT,                 Signs/Symptoms:Syncope, Dizziness/Lightheadedness, Chest Pain,                 Dyspnea and Shortness of Breath; Risk Factors:Sleep Apnea.                 Aortic stenosis.  Sonographer:    Ellouise Mose RDCS Referring Phys: [id number] DAPHNE LITTIE BARRACK  Sonographer Comments: Technically difficult study due to poor echo windows. Patient has broken ribs, could not fully turn to left decubitus. Patient could not hold breath due to pain. IMPRESSIONS  1. Left ventricular ejection fraction, by estimation, is 45 to 50%. The left ventricle has mildly decreased function. The left ventricle demonstrates global hypokinesis. Left ventricular diastolic parameters are indeterminate.  2. The mitral valve is degenerative. No evidence of mitral valve regurgitation. No evidence of mitral stenosis. Severe mitral annular calcification.  3. Tricuspid valve regurgitation is moderate.  4. Calcified Valve with DVI 0.38 and normal SVI. Pre-enhancement agent Doppler gradients reported. The aortic valve is calcified. Aortic valve regurgitation is mild. Mild to moderate aortic valve stenosis. Aortic valve mean gradient measures 20.0 mmHg. Aortic valve Vmax measures 3.00 m/s.  5. There is mildly elevated pulmonary artery systolic pressure. The estimated right ventricular systolic pressure is 36.9 mmHg.  6. The inferior vena cava is normal in size with <50% respiratory variability, suggesting right atrial pressure of 8 mmHg. Comparison(s): Prior images reviewed side by side. Compared to recent images from limited study, no significant change. Suboptimal aortic regurgitation assessment; unable to perform regurgitant volume in the setting of tricuspid regurgitation; Similar aortic valve gradients. FINDINGS  Left  Ventricle: Left  ventricular ejection fraction, by estimation, is 45 to 50%. The left ventricle has mildly decreased function. The left ventricle demonstrates global hypokinesis. Definity  contrast agent was given IV to delineate the left ventricular  endocardial borders. Left ventricular diastolic parameters are indeterminate. Right Ventricle: There is mildly elevated pulmonary artery systolic pressure. The tricuspid regurgitant velocity is 2.69 m/s, and with an assumed right atrial pressure of 8 mmHg, the estimated right ventricular systolic pressure is 36.9 mmHg. Mitral Valve: The mitral valve is degenerative in appearance. Severe mitral annular calcification. No evidence of mitral valve stenosis. Tricuspid Valve: The tricuspid valve is normal in structure. Tricuspid valve regurgitation is moderate . No evidence of tricuspid stenosis. Aortic Valve: Calcified Valve with DVI 0.38 and normal SVI. Pre-enhancement agent Doppler gradients reported. The aortic valve is calcified. Aortic valve regurgitation is mild. Mild to moderate aortic stenosis is present. Aortic valve mean gradient measures 20.0 mmHg. Aortic valve peak gradient measures 36.0 mmHg. Aortic valve area, by VTI measures 1.44 cm. Venous: The inferior vena cava is normal in size with less than 50% respiratory variability, suggesting right atrial pressure of 8 mmHg. Additional Comments: A device lead is visualized in the right ventricle. Spectral Doppler performed. Color Doppler performed.  LEFT VENTRICLE PLAX 2D LVOT diam:     2.20 cm LV SV:         90 LV SV Index:   41 LVOT Area:     3.80 cm  LV Volumes (MOD) LV vol d, MOD A2C: 110.0 ml LV vol d, MOD A4C: 159.0 ml LV vol s, MOD A2C: 51.5 ml LV vol s, MOD A4C: 103.0 ml LV SV MOD A2C:     58.5 ml LV SV MOD A4C:     159.0 ml LV SV MOD BP:      59.7 ml IVC IVC diam: 1.90 cm AORTIC VALVE AV Area (Vmax):    1.39 cm AV Area (Vmean):   1.40 cm AV Area (VTI):     1.44 cm AV Vmax:           300.00 cm/s AV Vmean:           202.600 cm/s AV VTI:            0.628 m AV Peak Grad:      36.0 mmHg AV Mean Grad:      20.0 mmHg LVOT Vmax:         110.00 cm/s LVOT Vmean:        74.500 cm/s LVOT VTI:          0.238 m LVOT/AV VTI ratio: 0.38 MITRAL VALVE               TRICUSPID VALVE MV Area (PHT): 4.31 cm    TR Peak grad:   28.9 mmHg MV Decel Time: 176 msec    TR Vmax:        269.00 cm/s MV E velocity: 64.20 cm/s MV A velocity: 99.20 cm/s  SHUNTS MV E/A ratio:  0.65        Systemic VTI:  0.24 m                            Systemic Diam: 2.20 cm Stanly Leavens MD Electronically signed by Stanly Leavens MD Signature Date/Time: 09/27/2024/1:36:14 PM    Final    ECHOCARDIOGRAM COMPLETE Result Date: 09/25/2024    ECHOCARDIOGRAM REPORT   Patient Name:   TORRYN HUDSPETH Date of Exam: 09/25/2024  Medical Rec #:  [id number]           Height:       75.0 in Accession #:    [id number]          Weight:       202.0 lb Date of Birth:  Apr 30, 1931          BSA:          2.204 m Patient Age:    89 years            BP:           111/55 mmHg Patient Gender: M                   HR:           71 bpm. Exam Location:  Inpatient Procedure: 2D Echo, Cardiac Doppler, Color Doppler, Intracardiac Opacification            Agent and PEDOF (Both Spectral and Color Flow Doppler were utilized            during procedure). Indications:    Syncope R55  History:        Patient has prior history of Echocardiogram examinations, most                 recent 01/12/2024. CAD, Prior CABG, Aortic Valve Disease,                 Signs/Symptoms:Syncope; Risk Factors:Hypertension, Dyslipidemia,                 CKD and Sleep Apnea.  Sonographer:    Koleen Popper RDCS Referring Phys: JUSTIN B HOWERTER  Sonographer Comments: Pt in pain throughout exam due to injury from fall. IMPRESSIONS  1. Left ventricular ejection fraction, by estimation, is 50%. The left ventricle has mildly decreased function. The left ventricle demonstrates global hypokinesis. There is mild asymmetric  left ventricular hypertrophy of the basal-septal segment. Left ventricular diastolic parameters are indeterminate.  2. Right ventricular systolic function is normal. The right ventricular size is normal. There is normal pulmonary artery systolic pressure.  3. The mitral valve is degenerative. No evidence of mitral valve regurgitation. No evidence of mitral stenosis. Moderate mitral annular calcification.  4. The aortic valve is tricuspid and is moderately calcified. There is mild aortic regurgitation. There is moderate aortic stenosis with gradients that are likely underestimated in the setting of a low SVI = 31 (AoV Vmax = 2.87 m/s, AoV mn Grad = 18 mmHg, AVA VTI = 1.26, AVA/BSA = 0.57). Comparison(s): No significant change from prior study. Conclusion(s)/Recommendation(s): No clear cause for syncope identified. Recommend repeat echocardiogram in 12 months for surveillance of aortic stenosis. FINDINGS  Left Ventricle: Left ventricular ejection fraction, by estimation, is 50%. The left ventricle has mildly decreased function. The left ventricle demonstrates global hypokinesis. Definity  contrast agent was given IV to delineate the left ventricular endocardial borders. The left ventricular internal cavity size was normal in size. There is mild asymmetric left ventricular hypertrophy of the basal-septal segment. Left ventricular diastolic parameters are indeterminate. Right Ventricle: The right ventricular size is normal. No increase in right ventricular wall thickness. Right ventricular systolic function is normal. There is normal pulmonary artery systolic pressure. The tricuspid regurgitant velocity is 2.66 m/s, and  with an assumed right atrial pressure of 3 mmHg, the estimated right ventricular systolic pressure is 31.3 mmHg. Left Atrium: Left atrial size was normal in size. Right Atrium: Right atrial size was normal  in size. Pericardium: There is no evidence of pericardial effusion. Mitral Valve: The mitral valve  is degenerative in appearance. Moderate mitral annular calcification. No evidence of mitral valve regurgitation. No evidence of mitral valve stenosis. Tricuspid Valve: The tricuspid valve is normal in structure. Tricuspid valve regurgitation is mild . No evidence of tricuspid stenosis. Aortic Valve: The aortic valve is tricuspid and is moderately calcified. There is mild aortic regurgitation. There is moderate aortic stenosis with gradients that are likely underestimated in the setting of a low SVI = 31 (AoV Vmax = 2.87 m/s, AoV mn Grad = 18 mmHg, AVA VTI = 1.26, AVA/BSA = 0.57). Aortic valve mean gradient measures 18.0 mmHg. Aortic valve peak gradient measures 32.9 mmHg. Aortic valve area, by VTI measures 1.26 cm. Pulmonic Valve: The pulmonic valve was normal in structure. Pulmonic valve regurgitation is mild. No evidence of pulmonic stenosis. Aorta: The aortic root and ascending aorta are structurally normal, with no evidence of dilitation. Venous: The inferior vena cava was not well visualized. IAS/Shunts: The interatrial septum was not well visualized. Additional Comments: A device lead is visualized.  LEFT VENTRICLE PLAX 2D LVIDd:         5.70 cm   Diastology LVIDs:         4.10 cm   LV e' medial:    6.25 cm/s LV PW:         1.10 cm   LV E/e' medial:  11.4 LV IVS:        1.30 cm   LV e' lateral:   7.81 cm/s LVOT diam:     2.00 cm   LV E/e' lateral: 9.1 LV SV:         68 LV SV Index:   31 LVOT Area:     3.14 cm  RIGHT VENTRICLE             IVC RV S prime:     11.20 cm/s  IVC diam: 2.20 cm TAPSE (M-mode): 1.7 cm LEFT ATRIUM             Index LA diam:        4.70 cm 2.13 cm/m LA Vol (A2C):   37.6 ml 17.06 ml/m LA Vol (A4C):   46.5 ml 21.10 ml/m LA Biplane Vol: 42.1 ml 19.10 ml/m  AORTIC VALVE AV Area (Vmax):    1.11 cm AV Area (Vmean):   1.13 cm AV Area (VTI):     1.26 cm AV Vmax:           287.00 cm/s AV Vmean:          194.000 cm/s AV VTI:            0.535 m AV Peak Grad:      32.9 mmHg AV Mean Grad:       18.0 mmHg LVOT Vmax:         101.00 cm/s LVOT Vmean:        69.800 cm/s LVOT VTI:          0.215 m LVOT/AV VTI ratio: 0.40  AORTA Ao Root diam: 3.40 cm MITRAL VALVE               TRICUSPID VALVE MV Area (PHT): 3.85 cm    TR Peak grad:   28.3 mmHg MV Decel Time: 197 msec    TR Vmax:        266.00 cm/s MV E velocity: 71.30 cm/s MV A velocity: 88.00 cm/s  SHUNTS MV E/A ratio:  0.81        Systemic VTI:  0.22 m                            Systemic Diam: 2.00 cm Georganna Archer Electronically signed by Georganna Archer Signature Date/Time: 09/25/2024/6:31:29 PM    Final    CT CHEST ABDOMEN PELVIS WO CONTRAST Result Date: 09/24/2024 CLINICAL DATA:  Unwitnessed fall with right rib pain. EXAM: CT CHEST, ABDOMEN AND PELVIS WITHOUT CONTRAST TECHNIQUE: Multidetector CT imaging of the chest, abdomen and pelvis was performed following the standard protocol without IV contrast. RADIATION DOSE REDUCTION: This exam was performed according to the departmental dose-optimization program which includes automated exposure control, adjustment of the mA and/or kV according to patient size and/or use of iterative reconstruction technique. COMPARISON:  CTA chest, abdomen, and pelvis dated 06/06/2024 FINDINGS: CT CHEST FINDINGS Cardiovascular: Left chest wall ICD leads terminate in the right atrium and ventricle. Normal heart size. No significant pericardial fluid/thickening. Dilated ascending thoracic aortic aneurysm measures 4.0 cm. Coronary artery calcifications. Mitral annular calcification. Mediastinum/Nodes: Imaged thyroid  gland without nodules meeting criteria for imaging follow-up by size. Small hiatal hernia. No pathologically enlarged axillary, supraclavicular, mediastinal, or hilar lymph nodes. Lungs/Pleura: The central airways are patent. Bilateral lower lobe subsegmental atelectasis. Biapical pleural-parenchymal scarring and calcified plaque. 3 mm subpleural right middle lobe nodule (5:103), unchanged. No pneumothorax.  Unchanged small left pleural effusion. Musculoskeletal: Subtle cortical angulation of the lateral right fifth, sixth, and seventh ribs. Nondisplaced fractures of the anterolateral right eighth and ninth ribs. Additional nondisplaced fracture of the posterolateral and posterior eighth rib at the costovertebral junction and posteromedial ninth rib. Nondisplaced fracture of the right T8 transverse process. Degenerative changes of the shoulders. Median sternotomy wires are nondisplaced. Subcutaneous soft tissue reticulation along the inferolateral right chest wall and right flank. Multilevel degenerative changes of the thoracic spine. Unchanged mild vertebral body height loss of T9. CT ABDOMEN PELVIS FINDINGS Hepatobiliary: Scattered subcentimeter hypodensities, too small to characterize but likely cysts. Slightly prominent appearance of the caudate lobe lying medial to the IVC. No intra or extrahepatic biliary ductal dilation. Normal gallbladder. Pancreas: No focal lesions or main ductal dilation. Spleen: Normal in size without focal abnormality. Adrenals/Urinary Tract: No adrenal nodules. No suspicious renal lesions by noncontrast technique. Bilateral simple cysts, unchanged. No specific follow-up imaging recommended. No hydronephrosis or calculi. Trabeculated bladder contour with multiple small diverticula. Stomach/Bowel: Normal appearance of the stomach. 1.4 x 1.2 cm lipoma in the duodenal bulb (3:63). No evidence of bowel wall thickening, distention, or inflammatory changes. Colonic diverticulosis without acute diverticulitis. Normal appendix extends into the right inguinal hernia. Vascular/Lymphatic: Aortic atherosclerosis. Infrarenal abdominal aortic aneurysm status post repair with aorto bi-iliac stent graft. Excluded aneurysm sac measures 5.0 x 4.9 cm, unchanged. No enlarged abdominal or pelvic lymph nodes. Reproductive: Prostate is unremarkable. Penile prosthesis. Deflated reservoir in the midline anterior  pelvis. Other: No free fluid, fluid collection, or free air. Musculoskeletal: No acute or abnormal lytic or blastic osseous findings. Small fat-containing bilateral inguinal hernias. Multilevel degenerative changes of the lumbar spine. IMPRESSION: 1. Nondisplaced fractures of the right eighth and ninth ribs, as described. 2. Nondisplaced fracture of the right T8 transverse process. 3. Subtle cortical angulation of the lateral right fifth, sixth, and seventh ribs, which may represent nondisplaced fractures. 4. Subcutaneous soft tissue contusion along the inferolateral right chest wall and right flank. 5. Unchanged small left pleural effusion. 6. No acute traumatic injury in the  abdomen or pelvis. 7.  Aortic Atherosclerosis (ICD10-I70.0). Electronically Signed   By: Limin  Xu M.D.   On: 09/24/2024 18:00   CT CERVICAL SPINE WO CONTRAST Result Date: 09/24/2024 EXAM: CT CERVICAL SPINE WITHOUT CONTRAST 09/24/2024 05:14:41 PM TECHNIQUE: CT of the cervical spine was performed without the administration of intravenous contrast. Multiplanar reformatted images are provided for review. Automated exposure control, iterative reconstruction, and/or weight based adjustment of the mA/kV was utilized to reduce the radiation dose to as low as reasonably achievable. COMPARISON: CT of the cervical spine 12/01/2022. CLINICAL HISTORY: Polytrauma, blunt. Fall in shower yesterday. FINDINGS: BONES AND ALIGNMENT: No acute fracture or traumatic malalignment. DEGENERATIVE CHANGES: Slight degenerative anterolisthesis at C4-C5 is stable. Uncovertebral and facet spurring contribute to moderate foraminal narrowing bilaterally at C3-C4 and C4-C5. Moderate left foraminal stenosis is present at C5-C6 and C6-C7. SOFT TISSUES: No prevertebral soft tissue swelling. Atherosclerotic changes are present at the aortic arch and great vessel origins. Bilateral carotid bifurcation stents are patent. IMPRESSION: 1. No acute cervical spine fracture or  dislocation. Electronically signed by: Lonni Necessary MD 09/24/2024 05:28 PM EST RP Workstation: [id number]   CT HEAD WO CONTRAST Result Date: 09/24/2024 EXAM: CT HEAD WITHOUT CONTRAST 09/24/2024 05:14:41 PM TECHNIQUE: CT of the head was performed without the administration of intravenous contrast. Automated exposure control, iterative reconstruction, and/or weight based adjustment of the mA/kV was utilized to reduce the radiation dose to as low as reasonably achievable. COMPARISON: CT head without contrast 03/23/2023. CLINICAL HISTORY: Head trauma, moderate-severe. f aorta all in shower. FINDINGS: BRAIN AND VENTRICLES: No acute hemorrhage. No evidence of acute infarct. No hydrocephalus. No extra-axial collection. No mass effect or midline shift. Age-related atrophy. ORBITS: No acute abnormality. SINUSES: Partial opacification of the lower left mastoid air cells. Mucosal thickening of the right maxillary sinus. SOFT TISSUES AND SKULL: No acute soft tissue abnormality. No skull fracture. Atherosclerosis of skull base vasculature without hyperdense vessel or abnormal calcification. IMPRESSION: 1. No acute intracranial abnormality or evidence of acute trauma. Electronically signed by: Lonni Necessary MD 09/24/2024 05:25 PM EST RP Workstation: [id number]   DG Elbow Complete Right Result Date: 09/24/2024 CLINICAL DATA:  Blunt trauma EXAM: RIGHT ELBOW - COMPLETE 3+ VIEW COMPARISON:  None Available. FINDINGS: There is no evidence of fracture, dislocation, or joint effusion. There is no evidence of arthropathy or other focal bone abnormality. Soft tissues are unremarkable. IMPRESSION: Negative. Electronically Signed   By: Lynwood Landy Raddle M.D.   On: 09/24/2024 16:06   DG Chest Port 1 View Result Date: 09/24/2024 CLINICAL DATA:  Blunt trauma EXAM: PORTABLE CHEST 1 VIEW COMPARISON:  June 06, 2024 FINDINGS: Stable cardiomediastinal silhouette. Left-sided defibrillator is noted. Status post coronary bypass  graft. Lungs are clear. Degenerative changes seen involving the right glenohumeral joint. IMPRESSION: No active disease. Electronically Signed   By: Lynwood Landy Raddle M.D.   On: 09/24/2024 16:04   DG Pelvis Portable Result Date: 09/24/2024 CLINICAL DATA:  Blunt trauma EXAM: PORTABLE PELVIS 1-2 VIEWS COMPARISON:  None Available. FINDINGS: There is no evidence of pelvic fracture or diastasis. No pelvic bone lesions are seen. IMPRESSION: Negative. Electronically Signed   By: Lynwood Landy Raddle M.D.   On: 09/24/2024 16:01   DG Knee Right Port Result Date: 09/24/2024 CLINICAL DATA:  Blunt trauma EXAM: PORTABLE RIGHT KNEE - 1-2 VIEW COMPARISON:  None Available. FINDINGS: No evidence of fracture, dislocation, or joint effusion. Moderate narrowing of medial joint space is noted. Soft tissues are unremarkable. IMPRESSION: Moderate degenerative joint disease  is noted medially. No acute abnormality seen. Electronically Signed   By: Lynwood Landy Raddle M.D.   On: 09/24/2024 16:00   CUP PACEART INCLINIC DEVICE CHECK Result Date: 08/29/2024 Normal in-clinic dual chamber ICD check. Presenting Rhythm: AP/VS. Routine testing was performed. Thresholds, sensing, and impedance demonstrate stable parameters and no programming changes needed. 1 episode of VT requiring 1 round of ATP on 08/21/24 and  MD discussed episode and driving restrictions with patient and spouse in room. Estimated longevity 100%. Pt enrolled in remote follow-up.Bard Silvan, BSN, RN   Microbiology: Results for orders placed or performed during the hospital encounter of 06/06/24  Resp panel by RT-PCR (RSV, Flu A&B, Covid) Anterior Nasal Swab     Status: None   Collection Time: 06/06/24 12:52 PM   Specimen: Anterior Nasal Swab  Result Value Ref Range Status   SARS Coronavirus 2 by RT PCR NEGATIVE NEGATIVE Final   Influenza A by PCR NEGATIVE NEGATIVE Final   Influenza B by PCR NEGATIVE NEGATIVE Final    Comment: (NOTE) The Xpert Xpress SARS-CoV-2/FLU/RSV  plus assay is intended as an aid in the diagnosis of influenza from Nasopharyngeal swab specimens and should not be used as a sole basis for treatment. Nasal washings and aspirates are unacceptable for Xpert Xpress SARS-CoV-2/FLU/RSV testing.  Fact Sheet for Patients: bloggercourse.com  Fact Sheet for Healthcare Providers: seriousbroker.it  This test is not yet approved or cleared by the [place]  FDA and has been authorized for detection and/or diagnosis of SARS-CoV-2 by FDA under an Emergency Use Authorization (EUA). This EUA will remain in effect (meaning this test can be used) for the duration of the COVID-19 declaration under Section 564(b)(1) of the Act, 21 U.S.C. section 360bbb-3(b)(1), unless the authorization is terminated or revoked.     Resp Syncytial Virus by PCR NEGATIVE NEGATIVE Final    Comment: (NOTE) Fact Sheet for Patients: bloggercourse.com  Fact Sheet for Healthcare Providers: seriousbroker.it  This test is not yet approved or cleared by the [place]  FDA and has been authorized for detection and/or diagnosis of SARS-CoV-2 by FDA under an Emergency Use Authorization (EUA). This EUA will remain in effect (meaning this test can be used) for the duration of the COVID-19 declaration under Section 564(b)(1) of the Act, 21 U.S.C. section 360bbb-3(b)(1), unless the authorization is terminated or revoked.  Performed at [facility], [address], [place], [place] [address]   Respiratory (~20 pathogens) panel by PCR     Status: Abnormal   Collection Time: 06/06/24 12:52 PM   Specimen: Nasopharyngeal Swab; Respiratory  Result Value Ref Range Status   Adenovirus NOT DETECTED NOT DETECTED Final   Coronavirus 229E NOT DETECTED NOT DETECTED Final    Comment: (NOTE) The Coronavirus on the Respiratory Panel, DOES NOT test for the novel  Coronavirus  (2019 nCoV)    Coronavirus HKU1 NOT DETECTED NOT DETECTED Final   Coronavirus NL63 NOT DETECTED NOT DETECTED Final   Coronavirus OC43 NOT DETECTED NOT DETECTED Final   Metapneumovirus NOT DETECTED NOT DETECTED Final   Rhinovirus / Enterovirus DETECTED (A) NOT DETECTED Final   Influenza A NOT DETECTED NOT DETECTED Final   Influenza B NOT DETECTED NOT DETECTED Final   Parainfluenza Virus 1 NOT DETECTED NOT DETECTED Final   Parainfluenza Virus 2 NOT DETECTED NOT DETECTED Final   Parainfluenza Virus 3 NOT DETECTED NOT DETECTED Final   Parainfluenza Virus 4 NOT DETECTED NOT DETECTED Final   Respiratory Syncytial Virus NOT DETECTED NOT DETECTED Final  Bordetella pertussis NOT DETECTED NOT DETECTED Final   Bordetella Parapertussis NOT DETECTED NOT DETECTED Final   Chlamydophila pneumoniae NOT DETECTED NOT DETECTED Final   Mycoplasma pneumoniae NOT DETECTED NOT DETECTED Final    Comment: Performed at [facility], [address], [place], [place] [address]    Labs: CBC: Recent Labs  Lab 09/24/24 1518 09/24/24 1529 09/25/24 0520 09/26/24 0246  WBC 10.3  --  8.7 8.1  NEUTROABS 7.8*  --  6.1 5.2  HGB 12.6* 12.9* 11.8* 11.2*  HCT 40.0 38.0* 37.5* 35.6*  MCV 96.2  --  96.9 95.7  PLT 153  --  121* 136*   Basic Metabolic Panel: Recent Labs  Lab 09/24/24 1518 09/24/24 1529 09/25/24 0520 09/26/24 0246  NA 138 139 138 138  K 4.1 4.0 4.8 4.2  CL 100 101 101 102  CO2 27  --  28 28  GLUCOSE 122* 119* 110* 118*  BUN 29* 30* 27* 36*  CREATININE 1.63* 1.70* 1.53* 1.70*  CALCIUM  9.7  --  9.5 9.1  MG 1.7  --  2.8* 1.8  PHOS  --   --  3.3 4.3   Liver Function Tests: Recent Labs  Lab 09/24/24 1518 09/25/24 0520 09/26/24 0246  AST 26 33  --   ALT 12 13  --   ALKPHOS 114 97  --   BILITOT 0.5 0.5  --   PROT 7.2 6.7  --   ALBUMIN  3.8 3.7 3.4*   CBG: No results for input(s): GLUCAP in the last 168 hours.  Discharge time spent: greater than 30  minutes.  Signed: Lonni KANDICE Moose, MD [facility] 09/27/2024 "

## 2024-09-27 NOTE — Progress Notes (Signed)
 " PROGRESS NOTE    Eric Gordon  FMW:[id number] DOB: 1930/11/20 DOA: 09/24/2024 PCP: [name], MD  Subjective: Patient sitting up in recliner on rounds.  Complains of right anterior rib pain secondary to fractures.  Denies any dizziness or syncope.  Tech in room getting ready to do the echocardiogram while I was on rounds.   Brief Narrative:  As per H&P done on presentation: Eric Gordon is a 89 y.o. male with medical history significant for moderately severe aortic stenosis, paroxysmal atrial fibrillation chronically anticoagulated on Edoxaban , paroxysmal ventricular tachycardia status post AICD placement in June 2023, chronic systolic/diastolic heart failure, coronary disease status post CABG in 2012, essential tremor, CKD 3B with baseline creatinine 1.5-2.0, spontaneous pneumothorax in 2020 status post chest tube in April 2020, VATS/talc  pleurodesis in July 2020, who is admitted to [facility] on 09/24/2024 with single episode of syncope without prodrome after presenting from home to [facility] ED complaining of right-sided chest pain after syncopal episode.    The patient reports a single episode of syncope that occurred without prodrome while in the shower yesterday, on 09/23/24.  He notes that he was standing, showering, in his normal state of health, completely asymptomatic, and that he subsequently awoke on the floor of the shower, laying on his right side, having nearly smashed the shower seat that he was not using.  Denies any preceding associated or ensuing central or left-sided chest discomfort or any associated shortness of breath palpitations, diaphoresis do any preceding dizziness, lightheadedness or subjective sensation of impending loss of consciousness.    He has experienced no additional episodes of syncope following yesterday's isolated event.  However, he presents to the ED this evening complaining of right lateral chest wall discomfort that started with the fall  associated with a syncopal episode yesterday.  Reports the discomfort is sharp in nature, limited to the right/lateral right portion of the chest, worsening with deep inspiration.  No associated any hemoptysis or significant shortness of breath.  He denies any central or left-sided chest discomfort.  No exertional chest pain.  He also notes some mild right elbow and right knee pain that also started acutely at the time of yesterday's fall.    In the setting of a history of paroxysmal atrial fibrillation, he is chronically anticoagulated on Edoxaban .  He is noted to be on mexiletine as well as metoprolol  25 mg p.o. nightly as an outpatient.  He is unsure if he hit his head as component of this fall.  Denies any acute headache or neck discomfort.  Otherwise, no additional blood thinners as an outpatient.   His cardiac history is also notable for moderate aortic stenosis, paroxysmal ventricular tachycardia status post AICD placed in June 2023 by Dr. Cindie at [facility], chronic systolic/diastolic heart failure, for which she follows with [name] of [facility] cardiology, on Lasix  and spironolactone .  Additionally, he has a history of coronary artery disease status post CABG in 2012.   Most recent echocardiogram occurred in April 2025, although this was noted to be of suboptimal quality, resulting in the ureter and this echocardiogram being unable to evaluate left ventricular systolic or left ventricular diastolic function.  Otherwise, this echocardiogram from April 2025 with felt to demonstrate mild LVH, normal right ventricular systolic function, mild mitral digitation, moderate aortic regurgitation, moderate aortic stenosis with a aortic valve area of 1.01 cm per continuity equation using VTI.   Prior to that, the patient had undergone echocardiogram in February 2024 which was  notable for LVEF 45 to 50%, grade 1 diastolic dysfunction.   Medical history also notable for acute pulmonary embolism in April  2022, which, per chart review, was felt to be provoked as it occurred shortly after CABG.  He also has a history of spontaneous pneumothorax in 2020 status post chest tube in April 2020 followed by VATS/talc  pleurodesis in July 2020.   He presents to the ED this evening requesting pain control as a relates to his right lateral chest wall discomfort.   Per chart review, no previous high-sensitivity troponin T values available.  Most recent high sensitive troponin I data point occurred in September 2025, when troponin I was found to be 18 x 2 occurrences.     ED Course:  Vital signs in the ED were notable for the following: Afebrile; heart rates in the 70s to 80s; systolic blood pressures in the 1 teens 130s; respiratory rate 20-26, oxygen  saturation 94 to 100% on room air.   Labs were notable for the following: CMP was notable for the following: Sodium 130, potassium 4.1, bicarbonate 27, creatinine 1.63 compared to 1.82 on 07/21/2024, liver enzymes within normal limits.  Magnesium  level 1.7.  High sensitive troponin T was 31 x 2 occurrences, in the absence of any previously available troponin T data points.  Lactic acid 1.5.  CBC notable for white cell count 10,300, hemoglobin 12.6 unchanged from most recent prior hemoglobin value on 07/21/2024, platelet count 153.  INR 1.3.  Type and screen was completed in ED.  Urinalysis showed white blood cells with leukocyte esterase/nitrate negative and showed no evidence of hemoglobin.   Per my interpretation, EKG in ED demonstrated the following: In comparison to most recent prior EKG performed on 07/21/2024, today's EKG shows atrial fibrillation with heart rate 82, right bundle branch block, left anterior fascicular block, 2 inversions in V1 and V2, which appear unchanged measures prior EKG and appear consistent with his right bundle branch block, less than 1 mm ST depression in leads I, V1, V2, V3, with less than 1 mm ST depression in V1, V2, V3.  Unchanged most  recent right EKG and also consistent with his right bundle branch block, with demonstrate no evidence of ST changes.   Imaging in the ED, per corresponding formal radiology read, was notable for the following: 1 view chest x-ray showed no evidence of acute cardiopulmonary process.  Plain films of the pelvis showed no evidence of acute bony abnormality.  Plain films of the right elbow show no evidence of acute traumatic injury.  Plain films of the right knee showed moderate degenerative joint disease medially, but no evidence of acute traumatic injury.  CT head showed no evidence of acute intracranial process, including evidence of intracranial Atriance of acute infarct or CT cervical spine showed no evidence of acute cervical spine fracture or subluxation injury.  CT chest, abdomen, pelvis showed nondisplaced fractures of the right 8th and 9th ribs, without any associated pneumothorax, also demonstrating nondisplaced fracture of the right T8 transverse process, subcutaneous soft tissue contusion along the inferior lateral right chest wall and right flank, without any evidence of hemothorax, small left-sided pleural effusion, no pleural effusion on the left, will also showing no evidence of pulmonary infiltrate, or edema.  CT abdomen/pelvis showed no evidence of acute traumatic intra-abdominal or intrapelvic injury.   While in the ED, the following were administered: Fentanyl  100 mcg IV x 2 doses, lidocaine  patch, Robaxin  500 mg p.o. x 1 dose, lactated Ringer 's 500 cc bolus.  Subsequently, the patient was admitted for further evaluation management of single episode of syncope without prodrome, as well as pain control for acute right-sided nondisplaced fractures of the right 8th and 9th ribs and acute nondisplaced fracture of right T8 transverse process.     09/25/2024: Patient was seen alongside patient's wife.  Patient was admitted with syncope, with associated right-sided rib fractures following the fall.   Patient continues to report right-sided chest pain, secondary to rib fractures.  Optimize pain control.  Apply lidocaine .  Start oxycodone  ER 10 mg p.o. twice daily for 2 days.  Consult cardiology/EP team.  Patient has AICD.   09/26/2024: Patient seen alongside patient's wife.  Rib cage pain is better controlled.  Cardiology input is highly appreciated.  Syncope is thought to be related to V. tach/ICD firing.  Cardiology/EP teams are directing care.  Optimize pain control.   Assessment & Plan:   Principal Problem:   Syncope Active Problems:   Paroxysmal atrial fibrillation (HCC)   Moderate aortic stenosis   CKD stage 3b, GFR 30-44 ml/min (HCC)   Acquired hypothyroidism   HLD (hyperlipidemia)   Chronic combined systolic and diastolic heart failure (HCC)   Paroxysmal ventricular tachycardia (HCC)   Multiple rib fractures   Elevated troponin   Fracture of thoracic transverse process (HCC)     Syncope: - Likely cardiac in origin. - Input from cardiology and EP team is highly appreciated. - Syncope is thought to be related to V. tach and ICD firing.. - Continue telemetry monitoring.   Fracture of 8th and 9th rib: - Optimize pain control. - Lidocaine  to pain area. - Oxycodone  ER 10 mg p.o. twice daily for 2 days. - Incentive spirometry.   History of moderate aortic stenosis: - No shortness of breath or chest pain. - Patient was admitted with syncope.   CABG/coronary artery disease: - Stable. - No chest pain.   CHF/status post AICD placement: - Compensated.   Hypertension: - Currently controlled. - Continue to monitor closely. -Hypotension noted earlier (systolic blood pressure of 79 to 86 mmHg).   Hypothyroidism: -   Sleep apnea/CPAP use History of spontaneous pneumothorax.     DVT prophylaxis: Edoxaban . Code Status: Full code. Family Communication: No family at the bedside Disposition Plan: Hopefully will be ready for discharge tomorrow.  Will need to check with  cardiology.   Objective: Vitals:   09/27/24 0817 09/27/24 1157 09/27/24 1409 09/27/24 1410  BP:  (!) 130/44 (!) 98/32 (!) 105/35  Pulse: 88 79 72 72  Resp: 18 18 19    Temp: 97.6 F (36.4 C) 97.9 F (36.6 C) 98 F (36.7 C)   TempSrc: Oral Oral Oral   SpO2: 91% 94% 93% 93%  Weight:      Height:        Intake/Output Summary (Last 24 hours) at 09/27/2024 1424 Last data filed at 09/27/2024 1200 Gross per 24 hour  Intake 720 ml  Output 1150 ml  Net -430 ml   Filed Weights   09/24/24 2300 09/26/24 0622 09/27/24 0500  Weight: 91.6 kg 93.7 kg 94.5 kg    Examination:  Physical Exam Constitutional:      Appearance: Normal appearance.  HENT:     Head: Normocephalic and atraumatic.     Nose: Nose normal.     Mouth/Throat:     Mouth: Mucous membranes are moist.  Eyes:     Extraocular Movements: Extraocular movements intact.     Pupils: Pupils are equal, round, and reactive to light.  Cardiovascular:     Rate and Rhythm: Normal rate and regular rhythm.     Heart sounds: No murmur heard. Abdominal:     General: Bowel sounds are normal.     Palpations: Abdomen is soft.     Tenderness: There is no guarding.  Musculoskeletal:     Right lower leg: No edema.     Left lower leg: No edema.  Skin:    Coloration: Skin is not jaundiced.  Neurological:     General: No focal deficit present.     Mental Status: He is alert.  Psychiatric:        Mood and Affect: Mood normal.     Data Reviewed: I have personally reviewed following labs and imaging studies  CBC: Recent Labs  Lab 09/24/24 1518 09/24/24 1529 09/25/24 0520 09/26/24 0246  WBC 10.3  --  8.7 8.1  NEUTROABS 7.8*  --  6.1 5.2  HGB 12.6* 12.9* 11.8* 11.2*  HCT 40.0 38.0* 37.5* 35.6*  MCV 96.2  --  96.9 95.7  PLT 153  --  121* 136*   Basic Metabolic Panel: Recent Labs  Lab 09/24/24 1518 09/24/24 1529 09/25/24 0520 09/26/24 0246  NA 138 139 138 138  K 4.1 4.0 4.8 4.2  CL 100 101 101 102  CO2 27  --  28 28   GLUCOSE 122* 119* 110* 118*  BUN 29* 30* 27* 36*  CREATININE 1.63* 1.70* 1.53* 1.70*  CALCIUM  9.7  --  9.5 9.1  MG 1.7  --  2.8* 1.8  PHOS  --   --  3.3 4.3   GFR: Estimated Creatinine Clearance: 33.8 mL/min (A) (by C-G formula based on SCr of 1.7 mg/dL (H)). Liver Function Tests: Recent Labs  Lab 09/24/24 1518 09/25/24 0520 09/26/24 0246  AST 26 33  --   ALT 12 13  --   ALKPHOS 114 97  --   BILITOT 0.5 0.5  --   PROT 7.2 6.7  --   ALBUMIN  3.8 3.7 3.4*   No results for input(s): LIPASE, AMYLASE in the last 168 hours. No results for input(s): AMMONIA in the last 168 hours. Coagulation Profile: Recent Labs  Lab 09/24/24 1518  INR 1.3*   Cardiac Enzymes: No results for input(s): CKTOTAL, CKMB, CKMBINDEX, TROPONINI in the last 168 hours. ProBNP, BNP (last 5 results) Recent Labs    01/12/24 1250 04/12/24 1156 06/06/24 1246 07/21/24 1530 09/24/24 1932 09/25/24 0520  PROBNP  --   --   --   --  2,681.0* 2,721.0*  BNP 308.3* 323.5* 329.1* 500.1*  --   --    HbA1C: No results for input(s): HGBA1C in the last 72 hours. CBG: No results for input(s): GLUCAP in the last 168 hours. Lipid Profile: No results for input(s): CHOL, HDL, LDLCALC, TRIG, CHOLHDL, LDLDIRECT in the last 72 hours. Thyroid  Function Tests: No results for input(s): TSH, T4TOTAL, FREET4, T3FREE, THYROIDAB in the last 72 hours. Anemia Panel: No results for input(s): VITAMINB12, FOLATE, FERRITIN, TIBC, IRON, RETICCTPCT in the last 72 hours. Sepsis Labs: Recent Labs  Lab 09/24/24 1534  LATICACIDVEN 1.5    No results found for this or any previous visit (from the past 240 hours).   Radiology Studies: ECHOCARDIOGRAM LIMITED Result Date: 09/27/2024    ECHOCARDIOGRAM LIMITED REPORT   Patient Name:   Eric Gordon Date of Exam: 09/27/2024 Medical Rec #:  [id number]           Height:  75.0 in Accession #:    [id number]          Weight:       208.3  lb Date of Birth:  1931-04-11          BSA:          2.233 m Patient Age:    89 years            BP:           113/57 mmHg Patient Gender: M                   HR:           75 bpm. Exam Location:  Inpatient Procedure: Limited Echo, Cardiac Doppler and Color Doppler (Both Spectral and            Color Flow Doppler were utilized during procedure). Indications:    I35.0 Nonrheumatic aortic (valve) stenosis  History:        Patient has prior history of Echocardiogram examinations, most                 recent 09/25/2024. CHF, Abnormal ECG and Defibrillator, Aortic                 Valve Disease, Arrythmias:Atrial Fibrillation and VT,                 Signs/Symptoms:Syncope, Dizziness/Lightheadedness, Chest Pain,                 Dyspnea and Shortness of Breath; Risk Factors:Sleep Apnea.                 Aortic stenosis.  Sonographer:    Ellouise Mose RDCS Referring Phys: [id number] DAPHNE LITTIE BARRACK  Sonographer Comments: Technically difficult study due to poor echo windows. Patient has broken ribs, could not fully turn to left decubitus. Patient could not hold breath due to pain. IMPRESSIONS  1. Left ventricular ejection fraction, by estimation, is 45 to 50%. The left ventricle has mildly decreased function. The left ventricle demonstrates global hypokinesis. Left ventricular diastolic parameters are indeterminate.  2. The mitral valve is degenerative. No evidence of mitral valve regurgitation. No evidence of mitral stenosis. Severe mitral annular calcification.  3. Tricuspid valve regurgitation is moderate.  4. Calcified Valve with DVI 0.38 and normal SVI. Pre-enhancement agent Doppler gradients reported. The aortic valve is calcified. Aortic valve regurgitation is mild. Mild to moderate aortic valve stenosis. Aortic valve mean gradient measures 20.0 mmHg. Aortic valve Vmax measures 3.00 m/s.  5. There is mildly elevated pulmonary artery systolic pressure. The estimated right ventricular systolic pressure is 36.9 mmHg.  6. The  inferior vena cava is normal in size with <50% respiratory variability, suggesting right atrial pressure of 8 mmHg. Comparison(s): Prior images reviewed side by side. Compared to recent images from limited study, no significant change. Suboptimal aortic regurgitation assessment; unable to perform regurgitant volume in the setting of tricuspid regurgitation; Similar aortic valve gradients. FINDINGS  Left Ventricle: Left ventricular ejection fraction, by estimation, is 45 to 50%. The left ventricle has mildly decreased function. The left ventricle demonstrates global hypokinesis. Definity  contrast agent was given IV to delineate the left ventricular  endocardial borders. Left ventricular diastolic parameters are indeterminate. Right Ventricle: There is mildly elevated pulmonary artery systolic pressure. The tricuspid regurgitant velocity is 2.69 m/s, and with an assumed right atrial pressure of 8 mmHg, the estimated right ventricular systolic pressure is 36.9 mmHg. Mitral Valve: The mitral valve is  degenerative in appearance. Severe mitral annular calcification. No evidence of mitral valve stenosis. Tricuspid Valve: The tricuspid valve is normal in structure. Tricuspid valve regurgitation is moderate . No evidence of tricuspid stenosis. Aortic Valve: Calcified Valve with DVI 0.38 and normal SVI. Pre-enhancement agent Doppler gradients reported. The aortic valve is calcified. Aortic valve regurgitation is mild. Mild to moderate aortic stenosis is present. Aortic valve mean gradient measures 20.0 mmHg. Aortic valve peak gradient measures 36.0 mmHg. Aortic valve area, by VTI measures 1.44 cm. Venous: The inferior vena cava is normal in size with less than 50% respiratory variability, suggesting right atrial pressure of 8 mmHg. Additional Comments: A device lead is visualized in the right ventricle. Spectral Doppler performed. Color Doppler performed.  LEFT VENTRICLE PLAX 2D LVOT diam:     2.20 cm LV SV:         90 LV SV  Index:   41 LVOT Area:     3.80 cm  LV Volumes (MOD) LV vol d, MOD A2C: 110.0 ml LV vol d, MOD A4C: 159.0 ml LV vol s, MOD A2C: 51.5 ml LV vol s, MOD A4C: 103.0 ml LV SV MOD A2C:     58.5 ml LV SV MOD A4C:     159.0 ml LV SV MOD BP:      59.7 ml IVC IVC diam: 1.90 cm AORTIC VALVE AV Area (Vmax):    1.39 cm AV Area (Vmean):   1.40 cm AV Area (VTI):     1.44 cm AV Vmax:           300.00 cm/s AV Vmean:          202.600 cm/s AV VTI:            0.628 m AV Peak Grad:      36.0 mmHg AV Mean Grad:      20.0 mmHg LVOT Vmax:         110.00 cm/s LVOT Vmean:        74.500 cm/s LVOT VTI:          0.238 m LVOT/AV VTI ratio: 0.38 MITRAL VALVE               TRICUSPID VALVE MV Area (PHT): 4.31 cm    TR Peak grad:   28.9 mmHg MV Decel Time: 176 msec    TR Vmax:        269.00 cm/s MV E velocity: 64.20 cm/s MV A velocity: 99.20 cm/s  SHUNTS MV E/A ratio:  0.65        Systemic VTI:  0.24 m                            Systemic Diam: 2.20 cm Stanly Leavens MD Electronically signed by Stanly Leavens MD Signature Date/Time: 09/27/2024/1:36:14 PM    Final    ECHOCARDIOGRAM COMPLETE Result Date: 09/25/2024    ECHOCARDIOGRAM REPORT   Patient Name:   Eric Gordon Date of Exam: 09/25/2024 Medical Rec #:  [id number]           Height:       75.0 in Accession #:    [id number]          Weight:       202.0 lb Date of Birth:  06/10/1931          BSA:          2.204 m Patient Age:    73 years  BP:           111/55 mmHg Patient Gender: M                   HR:           71 bpm. Exam Location:  Inpatient Procedure: 2D Echo, Cardiac Doppler, Color Doppler, Intracardiac Opacification            Agent and PEDOF (Both Spectral and Color Flow Doppler were utilized            during procedure). Indications:    Syncope R55  History:        Patient has prior history of Echocardiogram examinations, most                 recent 01/12/2024. CAD, Prior CABG, Aortic Valve Disease,                 Signs/Symptoms:Syncope; Risk  Factors:Hypertension, Dyslipidemia,                 CKD and Sleep Apnea.  Sonographer:    Koleen Popper RDCS Referring Phys: JUSTIN B HOWERTER  Sonographer Comments: Pt in pain throughout exam due to injury from fall. IMPRESSIONS  1. Left ventricular ejection fraction, by estimation, is 50%. The left ventricle has mildly decreased function. The left ventricle demonstrates global hypokinesis. There is mild asymmetric left ventricular hypertrophy of the basal-septal segment. Left ventricular diastolic parameters are indeterminate.  2. Right ventricular systolic function is normal. The right ventricular size is normal. There is normal pulmonary artery systolic pressure.  3. The mitral valve is degenerative. No evidence of mitral valve regurgitation. No evidence of mitral stenosis. Moderate mitral annular calcification.  4. The aortic valve is tricuspid and is moderately calcified. There is mild aortic regurgitation. There is moderate aortic stenosis with gradients that are likely underestimated in the setting of a low SVI = 31 (AoV Vmax = 2.87 m/s, AoV mn Grad = 18 mmHg, AVA VTI = 1.26, AVA/BSA = 0.57). Comparison(s): No significant change from prior study. Conclusion(s)/Recommendation(s): No clear cause for syncope identified. Recommend repeat echocardiogram in 12 months for surveillance of aortic stenosis. FINDINGS  Left Ventricle: Left ventricular ejection fraction, by estimation, is 50%. The left ventricle has mildly decreased function. The left ventricle demonstrates global hypokinesis. Definity  contrast agent was given IV to delineate the left ventricular endocardial borders. The left ventricular internal cavity size was normal in size. There is mild asymmetric left ventricular hypertrophy of the basal-septal segment. Left ventricular diastolic parameters are indeterminate. Right Ventricle: The right ventricular size is normal. No increase in right ventricular wall thickness. Right ventricular systolic function  is normal. There is normal pulmonary artery systolic pressure. The tricuspid regurgitant velocity is 2.66 m/s, and  with an assumed right atrial pressure of 3 mmHg, the estimated right ventricular systolic pressure is 31.3 mmHg. Left Atrium: Left atrial size was normal in size. Right Atrium: Right atrial size was normal in size. Pericardium: There is no evidence of pericardial effusion. Mitral Valve: The mitral valve is degenerative in appearance. Moderate mitral annular calcification. No evidence of mitral valve regurgitation. No evidence of mitral valve stenosis. Tricuspid Valve: The tricuspid valve is normal in structure. Tricuspid valve regurgitation is mild . No evidence of tricuspid stenosis. Aortic Valve: The aortic valve is tricuspid and is moderately calcified. There is mild aortic regurgitation. There is moderate aortic stenosis with gradients that are likely underestimated in the setting of a low SVI = 31 (  AoV Vmax = 2.87 m/s, AoV mn Grad = 18 mmHg, AVA VTI = 1.26, AVA/BSA = 0.57). Aortic valve mean gradient measures 18.0 mmHg. Aortic valve peak gradient measures 32.9 mmHg. Aortic valve area, by VTI measures 1.26 cm. Pulmonic Valve: The pulmonic valve was normal in structure. Pulmonic valve regurgitation is mild. No evidence of pulmonic stenosis. Aorta: The aortic root and ascending aorta are structurally normal, with no evidence of dilitation. Venous: The inferior vena cava was not well visualized. IAS/Shunts: The interatrial septum was not well visualized. Additional Comments: A device lead is visualized.  LEFT VENTRICLE PLAX 2D LVIDd:         5.70 cm   Diastology LVIDs:         4.10 cm   LV e' medial:    6.25 cm/s LV PW:         1.10 cm   LV E/e' medial:  11.4 LV IVS:        1.30 cm   LV e' lateral:   7.81 cm/s LVOT diam:     2.00 cm   LV E/e' lateral: 9.1 LV SV:         68 LV SV Index:   31 LVOT Area:     3.14 cm  RIGHT VENTRICLE             IVC RV S prime:     11.20 cm/s  IVC diam: 2.20 cm TAPSE  (M-mode): 1.7 cm LEFT ATRIUM             Index LA diam:        4.70 cm 2.13 cm/m LA Vol (A2C):   37.6 ml 17.06 ml/m LA Vol (A4C):   46.5 ml 21.10 ml/m LA Biplane Vol: 42.1 ml 19.10 ml/m  AORTIC VALVE AV Area (Vmax):    1.11 cm AV Area (Vmean):   1.13 cm AV Area (VTI):     1.26 cm AV Vmax:           287.00 cm/s AV Vmean:          194.000 cm/s AV VTI:            0.535 m AV Peak Grad:      32.9 mmHg AV Mean Grad:      18.0 mmHg LVOT Vmax:         101.00 cm/s LVOT Vmean:        69.800 cm/s LVOT VTI:          0.215 m LVOT/AV VTI ratio: 0.40  AORTA Ao Root diam: 3.40 cm MITRAL VALVE               TRICUSPID VALVE MV Area (PHT): 3.85 cm    TR Peak grad:   28.3 mmHg MV Decel Time: 197 msec    TR Vmax:        266.00 cm/s MV E velocity: 71.30 cm/s MV A velocity: 88.00 cm/s  SHUNTS MV E/A ratio:  0.81        Systemic VTI:  0.22 m                            Systemic Diam: 2.00 cm Georganna Archer Electronically signed by Georganna Archer Signature Date/Time: 09/25/2024/6:31:29 PM    Final     Scheduled Meds:  atorvastatin   80 mg Oral QHS   edoxaban   30 mg Oral Q24H   furosemide   40 mg Oral Daily   levothyroxine   75 mcg Oral Q0600   lidocaine   1 patch Transdermal Q24H   metoprolol  succinate  25 mg Oral BID   mexiletine  250 mg Oral Q12H   pantoprazole   40 mg Oral QAC breakfast   spironolactone   12.5 mg Oral BID   Continuous Infusions:   LOS: 2 days   Time spent: 30 minutes  Lonni KANDICE Moose, MD  [facility]  09/27/2024, 2:24 PM   "

## 2024-09-27 NOTE — Progress Notes (Signed)
" °  Echocardiogram 2D Echocardiogram has been performed.  Devora Ellouise SAUNDERS 09/27/2024, 11:36 AM "

## 2024-10-26 ENCOUNTER — Other Ambulatory Visit: Payer: Self-pay

## 2024-10-26 DIAGNOSIS — I6523 Occlusion and stenosis of bilateral carotid arteries: Secondary | ICD-10-CM

## 2024-10-26 DIAGNOSIS — I7143 Infrarenal abdominal aortic aneurysm, without rupture: Secondary | ICD-10-CM

## 2024-11-01 ENCOUNTER — Ambulatory Visit: Admitting: Pulmonary Disease

## 2024-11-18 ENCOUNTER — Ambulatory Visit (HOSPITAL_COMMUNITY)

## 2024-11-21 ENCOUNTER — Ambulatory Visit: Admitting: Surgery

## 2024-11-21 ENCOUNTER — Ambulatory Visit (HOSPITAL_COMMUNITY)

## 2024-11-23 ENCOUNTER — Ambulatory Visit

## 2025-02-22 ENCOUNTER — Ambulatory Visit

## 2025-05-24 ENCOUNTER — Ambulatory Visit

## 2025-08-23 ENCOUNTER — Ambulatory Visit
